# Patient Record
Sex: Female | Born: 1955
Health system: Southern US, Community
[De-identification: ages and names within clinical notes are randomized; demographics above are authoritative.]

## PROBLEM LIST (undated history)

## (undated) DIAGNOSIS — I69391 Dysphagia following cerebral infarction: Secondary | ICD-10-CM

## (undated) DIAGNOSIS — I639 Cerebral infarction, unspecified: Secondary | ICD-10-CM

## (undated) DIAGNOSIS — R569 Unspecified convulsions: Secondary | ICD-10-CM

## (undated) DIAGNOSIS — I10 Essential (primary) hypertension: Secondary | ICD-10-CM

## (undated) DIAGNOSIS — E669 Obesity, unspecified: Secondary | ICD-10-CM

## (undated) DIAGNOSIS — E119 Type 2 diabetes mellitus without complications: Secondary | ICD-10-CM

## (undated) DIAGNOSIS — J45909 Unspecified asthma, uncomplicated: Secondary | ICD-10-CM

## (undated) DIAGNOSIS — R7303 Prediabetes: Secondary | ICD-10-CM

## (undated) DIAGNOSIS — E785 Hyperlipidemia, unspecified: Secondary | ICD-10-CM

## (undated) DIAGNOSIS — E1169 Type 2 diabetes mellitus with other specified complication: Secondary | ICD-10-CM

## (undated) DIAGNOSIS — N1832 Chronic kidney disease, stage 3b: Secondary | ICD-10-CM

## (undated) HISTORY — DX: Dysphagia following cerebral infarction: I69.391

## (undated) HISTORY — DX: Chronic kidney disease, stage 3b: N18.32

## (undated) HISTORY — DX: Unspecified convulsions: R56.9

## (undated) HISTORY — PX: OTHER SURGICAL HISTORY: SHX169

## (undated) HISTORY — PX: CARPAL TUNNEL RELEASE: SHX101

## (undated) HISTORY — DX: Type 2 diabetes mellitus with other specified complication: E66.9

## (undated) HISTORY — DX: Cerebral infarction, unspecified: I63.9

## (undated) HISTORY — DX: Type 2 diabetes mellitus with other specified complication: E11.69

## (undated) HISTORY — DX: Hyperlipidemia, unspecified: E78.5

## (undated) HISTORY — PX: ANKLE SURGERY: SHX546

## (undated) HISTORY — DX: Type 2 diabetes mellitus without complications: E11.9

---

## 2012-09-28 ENCOUNTER — Encounter (HOSPITAL_BASED_OUTPATIENT_CLINIC_OR_DEPARTMENT_OTHER): Payer: Self-pay | Admitting: *Deleted

## 2012-09-28 ENCOUNTER — Emergency Department (HOSPITAL_BASED_OUTPATIENT_CLINIC_OR_DEPARTMENT_OTHER)
Admission: EM | Admit: 2012-09-28 | Discharge: 2012-09-29 | Disposition: A | Discharge status: Home / Self Care | Payer: 59 | Attending: Emergency Medicine | Admitting: Emergency Medicine

## 2012-09-28 DIAGNOSIS — T50995A Adverse effect of other drugs, medicaments and biological substances, initial encounter: Secondary | ICD-10-CM

## 2012-09-28 DIAGNOSIS — T498X5A Adverse effect of other topical agents, initial encounter: Secondary | ICD-10-CM | POA: Insufficient documentation

## 2012-09-28 DIAGNOSIS — I10 Essential (primary) hypertension: Secondary | ICD-10-CM | POA: Insufficient documentation

## 2012-09-28 DIAGNOSIS — J45909 Unspecified asthma, uncomplicated: Secondary | ICD-10-CM | POA: Insufficient documentation

## 2012-09-28 DIAGNOSIS — H05229 Edema of unspecified orbit: Secondary | ICD-10-CM | POA: Insufficient documentation

## 2012-09-28 DIAGNOSIS — Z7982 Long term (current) use of aspirin: Secondary | ICD-10-CM | POA: Insufficient documentation

## 2012-09-28 DIAGNOSIS — Z79899 Other long term (current) drug therapy: Secondary | ICD-10-CM | POA: Insufficient documentation

## 2012-09-28 DIAGNOSIS — R220 Localized swelling, mass and lump, head: Secondary | ICD-10-CM | POA: Insufficient documentation

## 2012-09-28 DIAGNOSIS — R221 Localized swelling, mass and lump, neck: Secondary | ICD-10-CM | POA: Insufficient documentation

## 2012-09-28 HISTORY — DX: Prediabetes: R73.03

## 2012-09-28 HISTORY — DX: Unspecified asthma, uncomplicated: J45.909

## 2012-09-28 HISTORY — DX: Essential (primary) hypertension: I10

## 2012-09-28 MED ORDER — FAMOTIDINE IN NACL 20-0.9 MG/50ML-% IV SOLN
20.0000 mg | Freq: Once | INTRAVENOUS | Status: AC
  Administered 2012-09-28: 20 mg via INTRAVENOUS
  Filled 2012-09-28: qty 50

## 2012-09-28 MED ORDER — DIPHENHYDRAMINE HCL 50 MG/ML IJ SOLN
25.0000 mg | Freq: Once | INTRAMUSCULAR | Status: AC
  Administered 2012-09-28: 25 mg via INTRAVENOUS
  Filled 2012-09-28: qty 1

## 2012-09-28 NOTE — ED Provider Notes (Signed)
History    This chart was scribed for non-physician practitioner working with Hilario Quarry, MD by Donne Anon, ED Scribe. This patient was seen in room MH01/MH01 and the patient's care was started at 2122.   CSN: [id number]  Arrival date & time 09/28/12  2044   First MD Initiated Contact with Patient 09/28/12 2122      Chief Complaint  Patient presents with  . Allergic Reaction     The history is provided by the patient. No language interpreter was used.   Courtney Burke is a 57 y.o. female who presents to the Emergency Department complaining of gradual onset, gradually worsening facial swelling, including her eyes, lips and tongue, which began this morning when she work up. She states on Friday she had her hair dyes and noticed that shortly after her scalp was itching and oozing. She states that her eyes began swelling then as well. She state that yesterday she had a different type of chicken that she had never had. She states she saw her MD this morning and was prescribed Kenalog and Prednisone, but she reports the swelling has gotten worse.   Past Medical History  Diagnosis Date  . Hypertension   . Borderline diabetes   . Asthma     Past Surgical History  Procedure Laterality Date  . Carpel tunnel    . Ankle surgery      No family history on file.  History  Substance Use Topics  . Smoking status: Never Smoker   . Smokeless tobacco: Not on file  . Alcohol Use: No    OB History   Grav Para Term Preterm Abortions TAB SAB Ect Mult Living                  Review of Systems  HENT: Positive for facial swelling.   All other systems reviewed and are negative.    Allergies  Codeine  Home Medications   Current Outpatient Rx  Name  Route  Sig  Dispense  Refill  . albuterol (PROVENTIL HFA;VENTOLIN HFA) 108 (90 BASE) MCG/ACT inhaler   Inhalation   Inhale 2 puffs into the lungs every 6 (six) hours as needed for wheezing.         Marland Kitchen allopurinol (ZYLOPRIM) 100 MG  tablet   Oral   Take 100 mg by mouth daily.         Marland Kitchen amLODipine (NORVASC) 10 MG tablet   Oral   Take 10 mg by mouth daily.         Marland Kitchen aspirin 81 MG tablet   Oral   Take 81 mg by mouth daily.         Marland Kitchen atorvastatin (LIPITOR) 40 MG tablet   Oral   Take 40 mg by mouth daily.         Marland Kitchen linagliptin (TRADJENTA) 5 MG TABS tablet   Oral   Take 5 mg by mouth daily.         Marland Kitchen losartan-hydrochlorothiazide (HYZAAR) 100-12.5 MG per tablet   Oral   Take 1 tablet by mouth daily.         . metFORMIN (GLUCOPHAGE) 500 MG tablet   Oral   Take 500 mg by mouth 3 (three) times daily.         Marland Kitchen oxybutynin (DITROPAN) 5 MG tablet   Oral   Take 5 mg by mouth 3 (three) times daily.           Triage Vitals; BP 134/74  Pulse 75  Temp(Src) 98.9 F (37.2 C) (Oral)  Resp 16  Ht 5\' 8"  (1.727 m)  Wt 143 lb (64.864 kg)  BMI 21.75 kg/m2  SpO2 100%  LMP 09/28/2012  Physical Exam  Nursing note and vitals reviewed. Constitutional: She is oriented to person, place, and time. She appears well-developed and well-nourished. No distress.  HENT:  Head: Normocephalic and atraumatic.  Mouth/Throat: Oropharynx is clear and moist.  Edema surrounding orbits. Throat is clear.  Eyes: EOM are normal.  Neck: Neck supple. No tracheal deviation present.  Cardiovascular: Normal rate.   Pulmonary/Chest: Effort normal. No respiratory distress.  Musculoskeletal: Normal range of motion.  Neurological: She is alert and oriented to person, place, and time.  Skin: Skin is warm and dry.  Psychiatric: She has a normal mood and affect. Her behavior is normal.    ED Course  Procedures (including critical care time) DIAGNOSTIC STUDIES: Oxygen Saturation is 100% on room air, normal by my interpretation.    COORDINATION OF CARE: 9:35 PM Discussed treatment plan which includes fluids, medication and monitoring with pt at bedside and pt agreed to plan.     Labs Reviewed - No data to display No  results found.   No diagnosis found.   Pt has decreased swelling of right side of face,  Pt feels better,   I advised see Dr. Dimple Casey rtomorrow for recheck MDM   I personally performed the services in this documentation, which was scribed in my presence.  The recorded information has been reviewed and considered.   Barnet Pall.        Lonia Skinner [place], PA-C 09/28/12 2322

## 2012-09-28 NOTE — ED Notes (Signed)
Pt states that she got her hair done on Friday. States that she had some of her hair dyed. States on Sat here scalp was itching and "oozing" and eyes started swelling. States she also had a different type of chicken that she had never eaten. States that she saw her MD this morning and was prescribed prednisone and given a kenalog injection. Concerned that eye swelling has not improved and feeling that her tongue may be more swollen that normal. O2  sats 99% on RA. Denies sob. resp even and unlabored on exam. Eyes swollen on exam bilateral

## 2012-09-30 NOTE — ED Provider Notes (Signed)
History/physical exam/procedure(s) were performed by non-physician practitioner and as supervising physician I was immediately available for consultation/collaboration. I have reviewed all notes and am in agreement with care and plan.   Hilario Quarry, MD 09/30/12 1139

## 2013-03-16 DIAGNOSIS — E785 Hyperlipidemia, unspecified: Secondary | ICD-10-CM

## 2013-03-16 HISTORY — DX: Hyperlipidemia, unspecified: E78.5

## 2013-05-21 HISTORY — DX: Morbid (severe) obesity due to excess calories: E66.01

## 2014-08-11 DIAGNOSIS — N181 Chronic kidney disease, stage 1: Secondary | ICD-10-CM

## 2014-08-11 DIAGNOSIS — N183 Chronic kidney disease, stage 3 unspecified: Secondary | ICD-10-CM | POA: Insufficient documentation

## 2014-08-11 DIAGNOSIS — Z903 Acquired absence of stomach [part of]: Secondary | ICD-10-CM

## 2014-08-11 DIAGNOSIS — I129 Hypertensive chronic kidney disease with stage 1 through stage 4 chronic kidney disease, or unspecified chronic kidney disease: Secondary | ICD-10-CM | POA: Insufficient documentation

## 2014-08-11 HISTORY — DX: Acquired absence of stomach (part of): Z90.3

## 2014-08-11 HISTORY — DX: Chronic kidney disease, stage 1: N18.1

## 2015-06-30 LAB — HM DEXA SCAN

## 2015-07-15 DIAGNOSIS — M8580 Other specified disorders of bone density and structure, unspecified site: Secondary | ICD-10-CM | POA: Insufficient documentation

## 2015-09-28 DIAGNOSIS — B354 Tinea corporis: Secondary | ICD-10-CM | POA: Insufficient documentation

## 2015-09-28 DIAGNOSIS — H2513 Age-related nuclear cataract, bilateral: Secondary | ICD-10-CM

## 2015-09-28 DIAGNOSIS — R0683 Snoring: Secondary | ICD-10-CM | POA: Insufficient documentation

## 2015-09-28 DIAGNOSIS — I10 Essential (primary) hypertension: Secondary | ICD-10-CM | POA: Insufficient documentation

## 2015-09-28 DIAGNOSIS — N1832 Chronic kidney disease, stage 3b: Secondary | ICD-10-CM

## 2015-09-28 DIAGNOSIS — R7301 Impaired fasting glucose: Secondary | ICD-10-CM | POA: Insufficient documentation

## 2015-09-28 DIAGNOSIS — D473 Essential (hemorrhagic) thrombocythemia: Secondary | ICD-10-CM

## 2015-09-28 DIAGNOSIS — E669 Obesity, unspecified: Secondary | ICD-10-CM | POA: Insufficient documentation

## 2015-09-28 DIAGNOSIS — H16223 Keratoconjunctivitis sicca, not specified as Sjogren's, bilateral: Secondary | ICD-10-CM | POA: Insufficient documentation

## 2015-09-28 DIAGNOSIS — R232 Flushing: Secondary | ICD-10-CM | POA: Insufficient documentation

## 2015-09-28 DIAGNOSIS — N1831 Chronic kidney disease, stage 3a: Secondary | ICD-10-CM

## 2015-09-28 DIAGNOSIS — N959 Unspecified menopausal and perimenopausal disorder: Secondary | ICD-10-CM | POA: Insufficient documentation

## 2015-09-28 DIAGNOSIS — Z9884 Bariatric surgery status: Secondary | ICD-10-CM | POA: Insufficient documentation

## 2015-09-28 DIAGNOSIS — K219 Gastro-esophageal reflux disease without esophagitis: Secondary | ICD-10-CM | POA: Insufficient documentation

## 2015-09-28 DIAGNOSIS — Z789 Other specified health status: Secondary | ICD-10-CM | POA: Insufficient documentation

## 2015-09-28 DIAGNOSIS — R102 Pelvic and perineal pain: Secondary | ICD-10-CM | POA: Insufficient documentation

## 2015-09-28 DIAGNOSIS — R1319 Other dysphagia: Secondary | ICD-10-CM

## 2015-09-28 DIAGNOSIS — M2550 Pain in unspecified joint: Secondary | ICD-10-CM | POA: Insufficient documentation

## 2015-09-28 DIAGNOSIS — N318 Other neuromuscular dysfunction of bladder: Secondary | ICD-10-CM | POA: Insufficient documentation

## 2015-09-28 DIAGNOSIS — H524 Presbyopia: Secondary | ICD-10-CM | POA: Insufficient documentation

## 2015-09-28 DIAGNOSIS — J45909 Unspecified asthma, uncomplicated: Secondary | ICD-10-CM | POA: Insufficient documentation

## 2015-09-28 HISTORY — DX: Age-related nuclear cataract, bilateral: H25.13

## 2015-09-28 HISTORY — DX: Essential (primary) hypertension: I10

## 2015-09-28 HISTORY — DX: Gastro-esophageal reflux disease without esophagitis: K21.9

## 2015-09-28 HISTORY — DX: Keratoconjunctivitis sicca, not specified as Sjogren's, bilateral: H16.223

## 2015-09-28 HISTORY — DX: Essential (hemorrhagic) thrombocythemia: D47.3

## 2015-09-28 HISTORY — DX: Pain in unspecified joint: M25.50

## 2015-09-28 HISTORY — DX: Bariatric surgery status: Z98.84

## 2016-02-16 DIAGNOSIS — R519 Headache, unspecified: Secondary | ICD-10-CM | POA: Insufficient documentation

## 2016-05-08 DIAGNOSIS — J4520 Mild intermittent asthma, uncomplicated: Secondary | ICD-10-CM | POA: Insufficient documentation

## 2016-05-08 DIAGNOSIS — F322 Major depressive disorder, single episode, severe without psychotic features: Secondary | ICD-10-CM | POA: Insufficient documentation

## 2016-05-08 DIAGNOSIS — E559 Vitamin D deficiency, unspecified: Secondary | ICD-10-CM | POA: Insufficient documentation

## 2016-05-08 DIAGNOSIS — Z0000 Encounter for general adult medical examination without abnormal findings: Secondary | ICD-10-CM | POA: Insufficient documentation

## 2016-07-02 DIAGNOSIS — R920 Mammographic microcalcification found on diagnostic imaging of breast: Secondary | ICD-10-CM | POA: Insufficient documentation

## 2017-05-09 DIAGNOSIS — J301 Allergic rhinitis due to pollen: Secondary | ICD-10-CM | POA: Insufficient documentation

## 2018-06-05 DIAGNOSIS — G8929 Other chronic pain: Secondary | ICD-10-CM

## 2018-06-05 DIAGNOSIS — N62 Hypertrophy of breast: Secondary | ICD-10-CM | POA: Insufficient documentation

## 2018-06-05 DIAGNOSIS — M546 Pain in thoracic spine: Secondary | ICD-10-CM

## 2018-06-05 HISTORY — DX: Other chronic pain: G89.29

## 2018-06-05 HISTORY — DX: Pain in thoracic spine: M54.6

## 2019-07-08 ENCOUNTER — Encounter (HOSPITAL_BASED_OUTPATIENT_CLINIC_OR_DEPARTMENT_OTHER): Payer: Self-pay

## 2019-07-08 ENCOUNTER — Emergency Department (HOSPITAL_BASED_OUTPATIENT_CLINIC_OR_DEPARTMENT_OTHER)
Admission: EM | Admit: 2019-07-08 | Discharge: 2019-07-08 | Disposition: A | Discharge status: Home / Self Care | Payer: Medicare Other | Attending: Emergency Medicine | Admitting: Emergency Medicine

## 2019-07-08 ENCOUNTER — Other Ambulatory Visit: Payer: Self-pay

## 2019-07-08 DIAGNOSIS — Z7982 Long term (current) use of aspirin: Secondary | ICD-10-CM | POA: Diagnosis not present

## 2019-07-08 DIAGNOSIS — I10 Essential (primary) hypertension: Secondary | ICD-10-CM | POA: Insufficient documentation

## 2019-07-08 DIAGNOSIS — R197 Diarrhea, unspecified: Secondary | ICD-10-CM | POA: Diagnosis not present

## 2019-07-08 DIAGNOSIS — Z79899 Other long term (current) drug therapy: Secondary | ICD-10-CM | POA: Insufficient documentation

## 2019-07-08 DIAGNOSIS — J45909 Unspecified asthma, uncomplicated: Secondary | ICD-10-CM | POA: Diagnosis not present

## 2019-07-08 DIAGNOSIS — R112 Nausea with vomiting, unspecified: Secondary | ICD-10-CM

## 2019-07-08 LAB — CBC WITH DIFFERENTIAL/PLATELET
Abs Immature Granulocytes: 0.1 10*3/uL — ABNORMAL HIGH (ref 0.00–0.07)
Basophils Absolute: 0 10*3/uL (ref 0.0–0.1)
Basophils Relative: 0 %
Eosinophils Absolute: 1.2 10*3/uL — ABNORMAL HIGH (ref 0.0–0.5)
Eosinophils Relative: 14 %
HCT: 37.9 % (ref 36.0–46.0)
Hemoglobin: 12.7 g/dL (ref 12.0–15.0)
Immature Granulocytes: 1 %
Lymphocytes Relative: 21 %
Lymphs Abs: 1.9 10*3/uL (ref 0.7–4.0)
MCH: 28.5 pg (ref 26.0–34.0)
MCHC: 33.5 g/dL (ref 30.0–36.0)
MCV: 85.2 fL (ref 80.0–100.0)
Monocytes Absolute: 0.5 10*3/uL (ref 0.1–1.0)
Monocytes Relative: 6 %
Neutro Abs: 5 10*3/uL (ref 1.7–7.7)
Neutrophils Relative %: 58 %
Platelets: 453 10*3/uL — ABNORMAL HIGH (ref 150–400)
RBC: 4.45 MIL/uL (ref 3.87–5.11)
RDW: 14.1 % (ref 11.5–15.5)
WBC: 8.7 10*3/uL (ref 4.0–10.5)
nRBC: 0 % (ref 0.0–0.2)

## 2019-07-08 LAB — COMPREHENSIVE METABOLIC PANEL
ALT: 14 U/L (ref 0–44)
AST: 18 U/L (ref 15–41)
Albumin: 3.8 g/dL (ref 3.5–5.0)
Alkaline Phosphatase: 86 U/L (ref 38–126)
Anion gap: 7 (ref 5–15)
BUN: 16 mg/dL (ref 8–23)
CO2: 23 mmol/L (ref 22–32)
Calcium: 9.4 mg/dL (ref 8.9–10.3)
Chloride: 108 mmol/L (ref 98–111)
Creatinine, Ser: 1.59 mg/dL — ABNORMAL HIGH (ref 0.44–1.00)
GFR calc Af Amer: 39 mL/min — ABNORMAL LOW (ref >60)
GFR calc non Af Amer: 34 mL/min — ABNORMAL LOW (ref >60)
Glucose, Bld: 121 mg/dL — ABNORMAL HIGH (ref 70–99)
Potassium: 3.2 mmol/L — ABNORMAL LOW (ref 3.5–5.1)
Sodium: 138 mmol/L (ref 135–145)
Total Bilirubin: 0.8 mg/dL (ref 0.3–1.2)
Total Protein: 7.2 g/dL (ref 6.5–8.1)

## 2019-07-08 LAB — LIPASE, BLOOD: Lipase: 21 U/L (ref 11–51)

## 2019-07-08 MED ORDER — SODIUM CHLORIDE 0.9 % IV BOLUS
1000.0000 mL | Freq: Once | INTRAVENOUS | Status: AC
  Administered 2019-07-08: 1000 mL via INTRAVENOUS

## 2019-07-08 MED ORDER — ONDANSETRON HCL 4 MG/2ML IJ SOLN
4.0000 mg | Freq: Once | INTRAMUSCULAR | Status: AC
  Administered 2019-07-08: 13:00:00 4 mg via INTRAVENOUS
  Filled 2019-07-08: qty 2

## 2019-07-08 MED ORDER — SODIUM CHLORIDE 0.9 % IV BOLUS
500.0000 mL | Freq: Once | INTRAVENOUS | Status: AC
  Administered 2019-07-08: 500 mL via INTRAVENOUS

## 2019-07-08 MED ORDER — DICYCLOMINE HCL 20 MG PO TABS: 20.0000 mg | ORAL_TABLET | Freq: Two times a day (BID) | ORAL | 0 refills | Status: DC | PRN

## 2019-07-08 MED ORDER — FENTANYL CITRATE (PF) 100 MCG/2ML IJ SOLN
50.0000 ug | Freq: Once | INTRAMUSCULAR | Status: AC
  Administered 2019-07-08: 12:00:00 50 ug via INTRAVENOUS
  Filled 2019-07-08: qty 2

## 2019-07-08 MED ORDER — POTASSIUM CHLORIDE CRYS ER 20 MEQ PO TBCR
40.0000 meq | EXTENDED_RELEASE_TABLET | Freq: Once | ORAL | Status: AC
  Administered 2019-07-08: 14:00:00 40 meq via ORAL
  Filled 2019-07-08: qty 2

## 2019-07-08 MED ORDER — ONDANSETRON HCL 4 MG/2ML IJ SOLN
4.0000 mg | Freq: Once | INTRAMUSCULAR | Status: AC
  Administered 2019-07-08: 4 mg via INTRAVENOUS
  Filled 2019-07-08: qty 2

## 2019-07-08 NOTE — ED Triage Notes (Addendum)
Pt c/o abd pain, n/v/d x 5 days-NAD-steady gait-pt states she was seen by PCP yesterday-had labs drawn was advised she was dehydrated-neg rapid covid-rx zofran

## 2019-07-08 NOTE — Discharge Instructions (Addendum)
Follow up with your primary care doctor to discuss your hospital visit. Continue to hydrate orally with small sips of fluids throughout the day. Use Zofran as directed for nausea & vomiting. Bentyl as needed for abdominal pain.    SEEK IMMEDIATE MEDICAL ATTENTION IF: You begin having localized abdominal pain that does not go away or becomes severe You develop a fever Repeated vomiting occurs (multiple uncontrollable episodes) or you are unable to keep fluids down Blood is being passed in stools or vomit (bright red or black tarry stools).  New or worsening symptoms develop or you have any additional concerns.

## 2019-07-08 NOTE — ED Provider Notes (Signed)
[facility]PART[facility] Provider Note   CSN: [id number] Arrival date & time: 07/08/19  1117     History Chief Complaint  Patient presents with  . Abdominal Pain    Courtney Burke is a 64 y.o. female.  The history is provided by the patient and medical records. No language interpreter was used.  Abdominal Pain Associated symptoms: diarrhea, nausea and vomiting    Courtney Burke is a 64 y.o. female  with a PMH as listed below who presents to the Emergency Department complaining of generalized abdominal pain, nausea and vomiting for the last 5 days.  Associated with multiple episodes of nonbloody diarrhea.  Seen by primary care doctor yesterday for same where labs were drawn which were reassuring.  Covid test was performed in the office and negative, however PCR was sent and she will have results for that sometime today.  She was given prescription for Zofran.  She has tried this, however has continued to retch throughout the night despite medication.  Denies any fever or chills.  No urinary symptoms.  No cough, congestion, chest pain or shortness of breath.  No recent antibiotic use.  No known sick contacts.      Past Medical History:  Diagnosis Date  . Asthma   . Borderline diabetes   . Hypertension     There are no problems to display for this patient.   Past Surgical History:  Procedure Laterality Date  . ANKLE SURGERY    . CARPAL TUNNEL RELEASE    . carpel tunnel       OB History   No obstetric history on file.     No family history on file.  Social History   Tobacco Use  . Smoking status: Never Smoker  . Smokeless tobacco: Never Used  Substance Use Topics  . Alcohol use: No  . Drug use: No    Home Medications Prior to Admission medications   Medication Sig Start Date End Date Taking? Authorizing Provider  albuterol (PROVENTIL HFA;VENTOLIN HFA) 108 (90 BASE) MCG/ACT inhaler Inhale 2 puffs into the lungs every 6 (six) hours as needed for  wheezing.    [provider]  allopurinol (ZYLOPRIM) 100 MG tablet Take 100 mg by mouth daily.    [provider]  amLODipine (NORVASC) 10 MG tablet Take 10 mg by mouth daily.    [provider]  aspirin 81 MG tablet Take 81 mg by mouth daily.    [provider]  atorvastatin (LIPITOR) 40 MG tablet Take 40 mg by mouth daily.    [provider]  dicyclomine (BENTYL) 20 MG tablet Take 1 tablet (20 mg total) by mouth 2 (two) times daily as needed (Abdominal pain). 07/08/19   Angela Vazguez, Ozella Almond, PA-C  linagliptin (TRADJENTA) 5 MG TABS tablet Take 5 mg by mouth daily.    [provider]  losartan-hydrochlorothiazide (HYZAAR) 100-12.5 MG per tablet Take 1 tablet by mouth daily.    [provider]  metFORMIN (GLUCOPHAGE) 500 MG tablet Take 500 mg by mouth 3 (three) times daily.    [provider]  oxybutynin (DITROPAN) 5 MG tablet Take 5 mg by mouth 3 (three) times daily.    [provider]    Allergies    Codeine  Review of Systems   Review of Systems  Gastrointestinal: Positive for abdominal pain, diarrhea, nausea and vomiting.  All other systems reviewed and are negative.   Physical Exam Updated Vital Signs BP 136/75 (BP Location: Left  Arm)   Pulse 79   Temp 99.1 F (37.3 C) (Oral)   Resp 20   Ht 5\' 5"  (1.651 m)   Wt 99.8 kg   LMP 09/28/2012   SpO2 98%   BMI 36.61 kg/m   Physical Exam Vitals and nursing note reviewed.  Constitutional:      General: She is not in acute distress.    Appearance: She is well-developed.  HENT:     Head: Normocephalic and atraumatic.  Cardiovascular:     Rate and Rhythm: Normal rate and regular rhythm.     Heart sounds: Normal heart sounds. No murmur.  Pulmonary:     Effort: Pulmonary effort is normal. No respiratory distress.     Breath sounds: Normal breath sounds.  Abdominal:     General: There is no distension.     Palpations: Abdomen is soft.     Comments:  Generalized abdominal tenderness without rebound or guarding. No focal tenderness at McBurney's. Negative Murphy's.  Musculoskeletal:     Cervical back: Neck supple.  Skin:    General: Skin is warm and dry.  Neurological:     Mental Status: She is alert and oriented to person, place, and time.     ED Results / Procedures / Treatments   Labs (all labs ordered are listed, but only abnormal results are displayed) Labs Reviewed  COMPREHENSIVE METABOLIC PANEL - Abnormal; Notable for the following components:      Result Value   Potassium 3.2 (*)    Glucose, Bld 121 (*)    Creatinine, Ser 1.59 (*)    GFR calc non Af Amer 34 (*)    GFR calc Af Amer 39 (*)    All other components within normal limits  CBC WITH DIFFERENTIAL/PLATELET - Abnormal; Notable for the following components:   Platelets 453 (*)    Eosinophils Absolute 1.2 (*)    Abs Immature Granulocytes 0.10 (*)    All other components within normal limits  LIPASE, BLOOD    EKG None  Radiology No results found.  Procedures Procedures (including critical care time)  Medications Ordered in ED Medications  sodium chloride 0.9 % bolus 1,000 mL ( Intravenous Stopped 07/08/19 1302)  ondansetron (ZOFRAN) injection 4 mg (4 mg Intravenous Given 07/08/19 1159)  fentaNYL (SUBLIMAZE) injection 50 mcg (50 mcg Intravenous Given 07/08/19 1200)  sodium chloride 0.9 % bolus 500 mL ( Intravenous Stopped 07/08/19 1433)  ondansetron (ZOFRAN) injection 4 mg (4 mg Intravenous Given 07/08/19 1322)  potassium chloride SA (KLOR-CON) CR tablet 40 mEq (40 mEq Oral Given 07/08/19 1330)    ED Course  I have reviewed the triage vital signs and the nursing notes.  Pertinent labs & imaging results that were available during my care of the patient were reviewed by me and considered in my medical decision making (see chart for details).    MDM Rules/Calculators/A&P                      Courtney Burke is a 64 y.o. female who presents to ED for nausea,  vomiting and non-bloody diarrhea for 5 days. On exam, patient is afebrile, non-toxic appearing with a non-surgical abdominal exam. IV fluids and nausea medications given. Labs reviewed. Normal white count. Hypokalemia at 3.2 which was replenished in ED. Creatinine now 1.59 compared to 1.36 which is likely 2/2 dehydration. She was given 1.5 L IVF. On repeat examination, patient is now tolerating PO with no episodes of emesis since medication administration.  Repeat abdominal exam improved. Evaluation does not show pathology that would require ongoing emergent intervention or inpatient treatment. Encouraged her to call PCP today to schedule a follow up appointment in the next few days for recheck, Spoke at length with patient about signs or symptoms that should prompt return to emergency Department including inability to tolerate PO, blood in the stools, fevers, focal localization of abdominal pain, new/worsening symptoms or any additional concerns. Patient understands diagnosis and plan of care as dictated above. All questions answered.   Final Clinical Impression(s) / ED Diagnoses Final diagnoses:  Nausea vomiting and diarrhea    Rx / DC Orders ED Discharge Orders         Ordered    dicyclomine (BENTYL) 20 MG tablet  2 times daily PRN     07/08/19 1427           Candra Wegner, Ozella Almond, PA-C 07/08/19 1507    Margette Fast, MD 07/09/19 [phone]

## 2019-07-15 DIAGNOSIS — N179 Acute kidney failure, unspecified: Secondary | ICD-10-CM | POA: Diagnosis present

## 2019-07-16 DIAGNOSIS — R197 Diarrhea, unspecified: Secondary | ICD-10-CM | POA: Insufficient documentation

## 2019-07-16 DIAGNOSIS — R112 Nausea with vomiting, unspecified: Secondary | ICD-10-CM | POA: Insufficient documentation

## 2019-07-20 DIAGNOSIS — K2960 Other gastritis without bleeding: Secondary | ICD-10-CM

## 2019-07-20 DIAGNOSIS — K209 Esophagitis, unspecified without bleeding: Secondary | ICD-10-CM

## 2019-07-20 HISTORY — DX: Esophagitis, unspecified without bleeding: K20.90

## 2019-07-20 HISTORY — DX: Other gastritis without bleeding: K29.60

## 2019-08-18 DIAGNOSIS — K8020 Calculus of gallbladder without cholecystitis without obstruction: Secondary | ICD-10-CM

## 2019-08-18 HISTORY — DX: Calculus of gallbladder without cholecystitis without obstruction: K80.20

## 2020-06-30 ENCOUNTER — Emergency Department (HOSPITAL_COMMUNITY): Payer: Medicare Other | Admitting: Anesthesiology

## 2020-06-30 ENCOUNTER — Encounter (HOSPITAL_COMMUNITY): Payer: Self-pay | Admitting: Neurology

## 2020-06-30 ENCOUNTER — Emergency Department (HOSPITAL_COMMUNITY): Payer: Medicare Other

## 2020-06-30 ENCOUNTER — Inpatient Hospital Stay (HOSPITAL_COMMUNITY): Payer: Medicare Other

## 2020-06-30 ENCOUNTER — Inpatient Hospital Stay (HOSPITAL_COMMUNITY)
Admission: EM | Admit: 2020-06-30 | Discharge: 2020-07-09 | DRG: 023 | Disposition: A | Discharge status: Rehabilitation Facility | Payer: Medicare Other | Attending: Student | Admitting: Student

## 2020-06-30 ENCOUNTER — Encounter (HOSPITAL_COMMUNITY)
Admission: EM | Disposition: A | Discharge status: Rehabilitation Facility | Payer: Self-pay | Source: Home / Self Care | Attending: Student

## 2020-06-30 DIAGNOSIS — Z4659 Encounter for fitting and adjustment of other gastrointestinal appliance and device: Secondary | ICD-10-CM

## 2020-06-30 DIAGNOSIS — R471 Dysarthria and anarthria: Secondary | ICD-10-CM | POA: Diagnosis present

## 2020-06-30 DIAGNOSIS — E872 Acidosis: Secondary | ICD-10-CM | POA: Diagnosis present

## 2020-06-30 DIAGNOSIS — I63412 Cerebral infarction due to embolism of left middle cerebral artery: Principal | ICD-10-CM | POA: Diagnosis present

## 2020-06-30 DIAGNOSIS — I10 Essential (primary) hypertension: Secondary | ICD-10-CM | POA: Diagnosis not present

## 2020-06-30 DIAGNOSIS — E669 Obesity, unspecified: Secondary | ICD-10-CM

## 2020-06-30 DIAGNOSIS — J45909 Unspecified asthma, uncomplicated: Secondary | ICD-10-CM | POA: Diagnosis present

## 2020-06-30 DIAGNOSIS — D62 Acute posthemorrhagic anemia: Secondary | ICD-10-CM | POA: Diagnosis not present

## 2020-06-30 DIAGNOSIS — Z6837 Body mass index (BMI) 37.0-37.9, adult: Secondary | ICD-10-CM

## 2020-06-30 DIAGNOSIS — I69320 Aphasia following cerebral infarction: Secondary | ICD-10-CM | POA: Diagnosis not present

## 2020-06-30 DIAGNOSIS — J9600 Acute respiratory failure, unspecified whether with hypoxia or hypercapnia: Secondary | ICD-10-CM | POA: Diagnosis present

## 2020-06-30 DIAGNOSIS — E1122 Type 2 diabetes mellitus with diabetic chronic kidney disease: Secondary | ICD-10-CM | POA: Diagnosis present

## 2020-06-30 DIAGNOSIS — D509 Iron deficiency anemia, unspecified: Secondary | ICD-10-CM | POA: Diagnosis not present

## 2020-06-30 DIAGNOSIS — I63512 Cerebral infarction due to unspecified occlusion or stenosis of left middle cerebral artery: Secondary | ICD-10-CM | POA: Diagnosis present

## 2020-06-30 DIAGNOSIS — Z882 Allergy status to sulfonamides status: Secondary | ICD-10-CM

## 2020-06-30 DIAGNOSIS — G8191 Hemiplegia, unspecified affecting right dominant side: Secondary | ICD-10-CM | POA: Diagnosis present

## 2020-06-30 DIAGNOSIS — R531 Weakness: Secondary | ICD-10-CM | POA: Diagnosis present

## 2020-06-30 DIAGNOSIS — E1169 Type 2 diabetes mellitus with other specified complication: Secondary | ICD-10-CM

## 2020-06-30 DIAGNOSIS — Z79899 Other long term (current) drug therapy: Secondary | ICD-10-CM

## 2020-06-30 DIAGNOSIS — K661 Hemoperitoneum: Secondary | ICD-10-CM | POA: Diagnosis present

## 2020-06-30 DIAGNOSIS — R1319 Other dysphagia: Secondary | ICD-10-CM

## 2020-06-30 DIAGNOSIS — E870 Hyperosmolality and hypernatremia: Secondary | ICD-10-CM | POA: Diagnosis not present

## 2020-06-30 DIAGNOSIS — E785 Hyperlipidemia, unspecified: Secondary | ICD-10-CM

## 2020-06-30 DIAGNOSIS — M109 Gout, unspecified: Secondary | ICD-10-CM | POA: Diagnosis present

## 2020-06-30 DIAGNOSIS — R569 Unspecified convulsions: Secondary | ICD-10-CM | POA: Diagnosis not present

## 2020-06-30 DIAGNOSIS — I69391 Dysphagia following cerebral infarction: Secondary | ICD-10-CM

## 2020-06-30 DIAGNOSIS — R29721 NIHSS score 21: Secondary | ICD-10-CM | POA: Diagnosis present

## 2020-06-30 DIAGNOSIS — R29810 Facial weakness: Secondary | ICD-10-CM | POA: Diagnosis present

## 2020-06-30 DIAGNOSIS — R339 Retention of urine, unspecified: Secondary | ICD-10-CM | POA: Diagnosis present

## 2020-06-30 DIAGNOSIS — I129 Hypertensive chronic kidney disease with stage 1 through stage 4 chronic kidney disease, or unspecified chronic kidney disease: Secondary | ICD-10-CM | POA: Diagnosis present

## 2020-06-30 DIAGNOSIS — Z978 Presence of other specified devices: Secondary | ICD-10-CM

## 2020-06-30 DIAGNOSIS — Z885 Allergy status to narcotic agent status: Secondary | ICD-10-CM

## 2020-06-30 DIAGNOSIS — Z881 Allergy status to other antibiotic agents status: Secondary | ICD-10-CM

## 2020-06-30 DIAGNOSIS — Z7902 Long term (current) use of antithrombotics/antiplatelets: Secondary | ICD-10-CM

## 2020-06-30 DIAGNOSIS — Z7984 Long term (current) use of oral hypoglycemic drugs: Secondary | ICD-10-CM

## 2020-06-30 DIAGNOSIS — N1831 Chronic kidney disease, stage 3a: Secondary | ICD-10-CM | POA: Diagnosis present

## 2020-06-30 DIAGNOSIS — Z9884 Bariatric surgery status: Secondary | ICD-10-CM

## 2020-06-30 DIAGNOSIS — R4701 Aphasia: Secondary | ICD-10-CM | POA: Diagnosis present

## 2020-06-30 DIAGNOSIS — H5347 Heteronymous bilateral field defects: Secondary | ICD-10-CM | POA: Diagnosis present

## 2020-06-30 DIAGNOSIS — Z823 Family history of stroke: Secondary | ICD-10-CM | POA: Diagnosis not present

## 2020-06-30 DIAGNOSIS — R1310 Dysphagia, unspecified: Secondary | ICD-10-CM | POA: Diagnosis present

## 2020-06-30 DIAGNOSIS — N179 Acute kidney failure, unspecified: Secondary | ICD-10-CM | POA: Diagnosis present

## 2020-06-30 DIAGNOSIS — Z20822 Contact with and (suspected) exposure to covid-19: Secondary | ICD-10-CM | POA: Diagnosis present

## 2020-06-30 DIAGNOSIS — I639 Cerebral infarction, unspecified: Secondary | ICD-10-CM

## 2020-06-30 DIAGNOSIS — J95821 Acute postprocedural respiratory failure: Secondary | ICD-10-CM | POA: Diagnosis not present

## 2020-06-30 DIAGNOSIS — R338 Other retention of urine: Secondary | ICD-10-CM | POA: Diagnosis not present

## 2020-06-30 DIAGNOSIS — Z91041 Radiographic dye allergy status: Secondary | ICD-10-CM

## 2020-06-30 DIAGNOSIS — R509 Fever, unspecified: Secondary | ICD-10-CM

## 2020-06-30 DIAGNOSIS — N189 Chronic kidney disease, unspecified: Secondary | ICD-10-CM | POA: Diagnosis not present

## 2020-06-30 DIAGNOSIS — Z7982 Long term (current) use of aspirin: Secondary | ICD-10-CM

## 2020-06-30 DIAGNOSIS — I672 Cerebral atherosclerosis: Secondary | ICD-10-CM | POA: Diagnosis present

## 2020-06-30 DIAGNOSIS — E559 Vitamin D deficiency, unspecified: Secondary | ICD-10-CM | POA: Diagnosis present

## 2020-06-30 DIAGNOSIS — N1832 Chronic kidney disease, stage 3b: Secondary | ICD-10-CM

## 2020-06-30 DIAGNOSIS — Z888 Allergy status to other drugs, medicaments and biological substances status: Secondary | ICD-10-CM

## 2020-06-30 DIAGNOSIS — D631 Anemia in chronic kidney disease: Secondary | ICD-10-CM | POA: Diagnosis not present

## 2020-06-30 DIAGNOSIS — G460 Middle cerebral artery syndrome: Secondary | ICD-10-CM | POA: Diagnosis present

## 2020-06-30 DIAGNOSIS — R159 Full incontinence of feces: Secondary | ICD-10-CM | POA: Diagnosis present

## 2020-06-30 DIAGNOSIS — E876 Hypokalemia: Secondary | ICD-10-CM | POA: Diagnosis present

## 2020-06-30 DIAGNOSIS — I6521 Occlusion and stenosis of right carotid artery: Secondary | ICD-10-CM | POA: Diagnosis present

## 2020-06-30 HISTORY — PX: IR CT HEAD LTD: IMG2386

## 2020-06-30 HISTORY — PX: RADIOLOGY WITH ANESTHESIA: SHX6223

## 2020-06-30 HISTORY — DX: Cerebral infarction due to unspecified occlusion or stenosis of left middle cerebral artery: I63.512

## 2020-06-30 HISTORY — PX: IR PERCUTANEOUS ART THROMBECTOMY/INFUSION INTRACRANIAL INC DIAG ANGIO: IMG6087

## 2020-06-30 HISTORY — PX: IR INTRA CRAN STENT: IMG2345

## 2020-06-30 LAB — POCT I-STAT 7, (LYTES, BLD GAS, ICA,H+H)
Acid-base deficit: 10 mmol/L — ABNORMAL HIGH (ref 0.0–2.0)
Bicarbonate: 15.7 mmol/L — ABNORMAL LOW (ref 20.0–28.0)
Calcium, Ion: 1.17 mmol/L (ref 1.15–1.40)
HCT: 29 % — ABNORMAL LOW (ref 36.0–46.0)
Hemoglobin: 9.9 g/dL — ABNORMAL LOW (ref 12.0–15.0)
O2 Saturation: 100 %
Potassium: 3.4 mmol/L — ABNORMAL LOW (ref 3.5–5.1)
Sodium: 142 mmol/L (ref 135–145)
TCO2: 17 mmol/L — ABNORMAL LOW (ref 22–32)
pCO2 arterial: 33.7 mmHg (ref 32.0–48.0)
pH, Arterial: 7.275 — ABNORMAL LOW (ref 7.350–7.450)
pO2, Arterial: 377 mmHg — ABNORMAL HIGH (ref 83.0–108.0)

## 2020-06-30 LAB — I-STAT CHEM 8, ED
BUN: 17 mg/dL (ref 8–23)
Calcium, Ion: 1.22 mmol/L (ref 1.15–1.40)
Chloride: 111 mmol/L (ref 98–111)
Creatinine, Ser: 1.3 mg/dL — ABNORMAL HIGH (ref 0.44–1.00)
Glucose, Bld: 135 mg/dL — ABNORMAL HIGH (ref 70–99)
HCT: 33 % — ABNORMAL LOW (ref 36.0–46.0)
Hemoglobin: 11.2 g/dL — ABNORMAL LOW (ref 12.0–15.0)
Potassium: 3.6 mmol/L (ref 3.5–5.1)
Sodium: 142 mmol/L (ref 135–145)
TCO2: 22 mmol/L (ref 22–32)

## 2020-06-30 LAB — COMPREHENSIVE METABOLIC PANEL
ALT: 15 U/L (ref 0–44)
AST: 21 U/L (ref 15–41)
Albumin: 3.4 g/dL — ABNORMAL LOW (ref 3.5–5.0)
Alkaline Phosphatase: 101 U/L (ref 38–126)
Anion gap: 9 (ref 5–15)
BUN: 14 mg/dL (ref 8–23)
CO2: 20 mmol/L — ABNORMAL LOW (ref 22–32)
Calcium: 9 mg/dL (ref 8.9–10.3)
Chloride: 111 mmol/L (ref 98–111)
Creatinine, Ser: 1.41 mg/dL — ABNORMAL HIGH (ref 0.44–1.00)
GFR, Estimated: 41 mL/min — ABNORMAL LOW (ref >60)
Glucose, Bld: 140 mg/dL — ABNORMAL HIGH (ref 70–99)
Potassium: 3.5 mmol/L (ref 3.5–5.1)
Sodium: 140 mmol/L (ref 135–145)
Total Bilirubin: 0.5 mg/dL (ref 0.3–1.2)
Total Protein: 6.4 g/dL — ABNORMAL LOW (ref 6.5–8.1)

## 2020-06-30 LAB — CBC
HCT: 36.3 % (ref 36.0–46.0)
Hemoglobin: 11.5 g/dL — ABNORMAL LOW (ref 12.0–15.0)
MCH: 28 pg (ref 26.0–34.0)
MCHC: 31.7 g/dL (ref 30.0–36.0)
MCV: 88.5 fL (ref 80.0–100.0)
Platelets: 507 10*3/uL — ABNORMAL HIGH (ref 150–400)
RBC: 4.1 MIL/uL (ref 3.87–5.11)
RDW: 13.8 % (ref 11.5–15.5)
WBC: 6.1 10*3/uL (ref 4.0–10.5)
nRBC: 0 % (ref 0.0–0.2)

## 2020-06-30 LAB — DIFFERENTIAL
Abs Immature Granulocytes: 0.03 10*3/uL (ref 0.00–0.07)
Basophils Absolute: 0 10*3/uL (ref 0.0–0.1)
Basophils Relative: 1 %
Eosinophils Absolute: 0.3 10*3/uL (ref 0.0–0.5)
Eosinophils Relative: 6 %
Immature Granulocytes: 1 %
Lymphocytes Relative: 29 %
Lymphs Abs: 1.8 10*3/uL (ref 0.7–4.0)
Monocytes Absolute: 0.4 10*3/uL (ref 0.1–1.0)
Monocytes Relative: 6 %
Neutro Abs: 3.6 10*3/uL (ref 1.7–7.7)
Neutrophils Relative %: 57 %

## 2020-06-30 LAB — APTT: aPTT: 25 seconds (ref 24–36)

## 2020-06-30 LAB — GLUCOSE, CAPILLARY
Glucose-Capillary: 192 mg/dL — ABNORMAL HIGH (ref 70–99)
Glucose-Capillary: 148 mg/dL — ABNORMAL HIGH (ref 70–99)
Glucose-Capillary: 137 mg/dL — ABNORMAL HIGH (ref 70–99)

## 2020-06-30 LAB — SARS CORONAVIRUS 2 BY RT PCR (HOSPITAL ORDER, PERFORMED IN ~~LOC~~ HOSPITAL LAB): SARS Coronavirus 2: NEGATIVE

## 2020-06-30 LAB — PROTIME-INR
INR: 1.1 (ref 0.8–1.2)
Prothrombin Time: 13.6 seconds (ref 11.4–15.2)

## 2020-06-30 LAB — MRSA PCR SCREENING: MRSA by PCR: NEGATIVE

## 2020-06-30 LAB — CBG MONITORING, ED
Glucose-Capillary: 134 mg/dL — ABNORMAL HIGH (ref 70–99)
Glucose-Capillary: 135 mg/dL — ABNORMAL HIGH (ref 70–99)

## 2020-06-30 LAB — HIV ANTIBODY (ROUTINE TESTING W REFLEX): HIV Screen 4th Generation wRfx: NONREACTIVE

## 2020-06-30 IMAGING — DX DG CHEST 1V PORT
1 series · 1 of 1 positions shown · non-contrast
Comparison: [DATE]

CLINICAL DATA: Evaluate endotracheal tube placement

EXAM:
PORTABLE CHEST 1 VIEW

[chest ap]
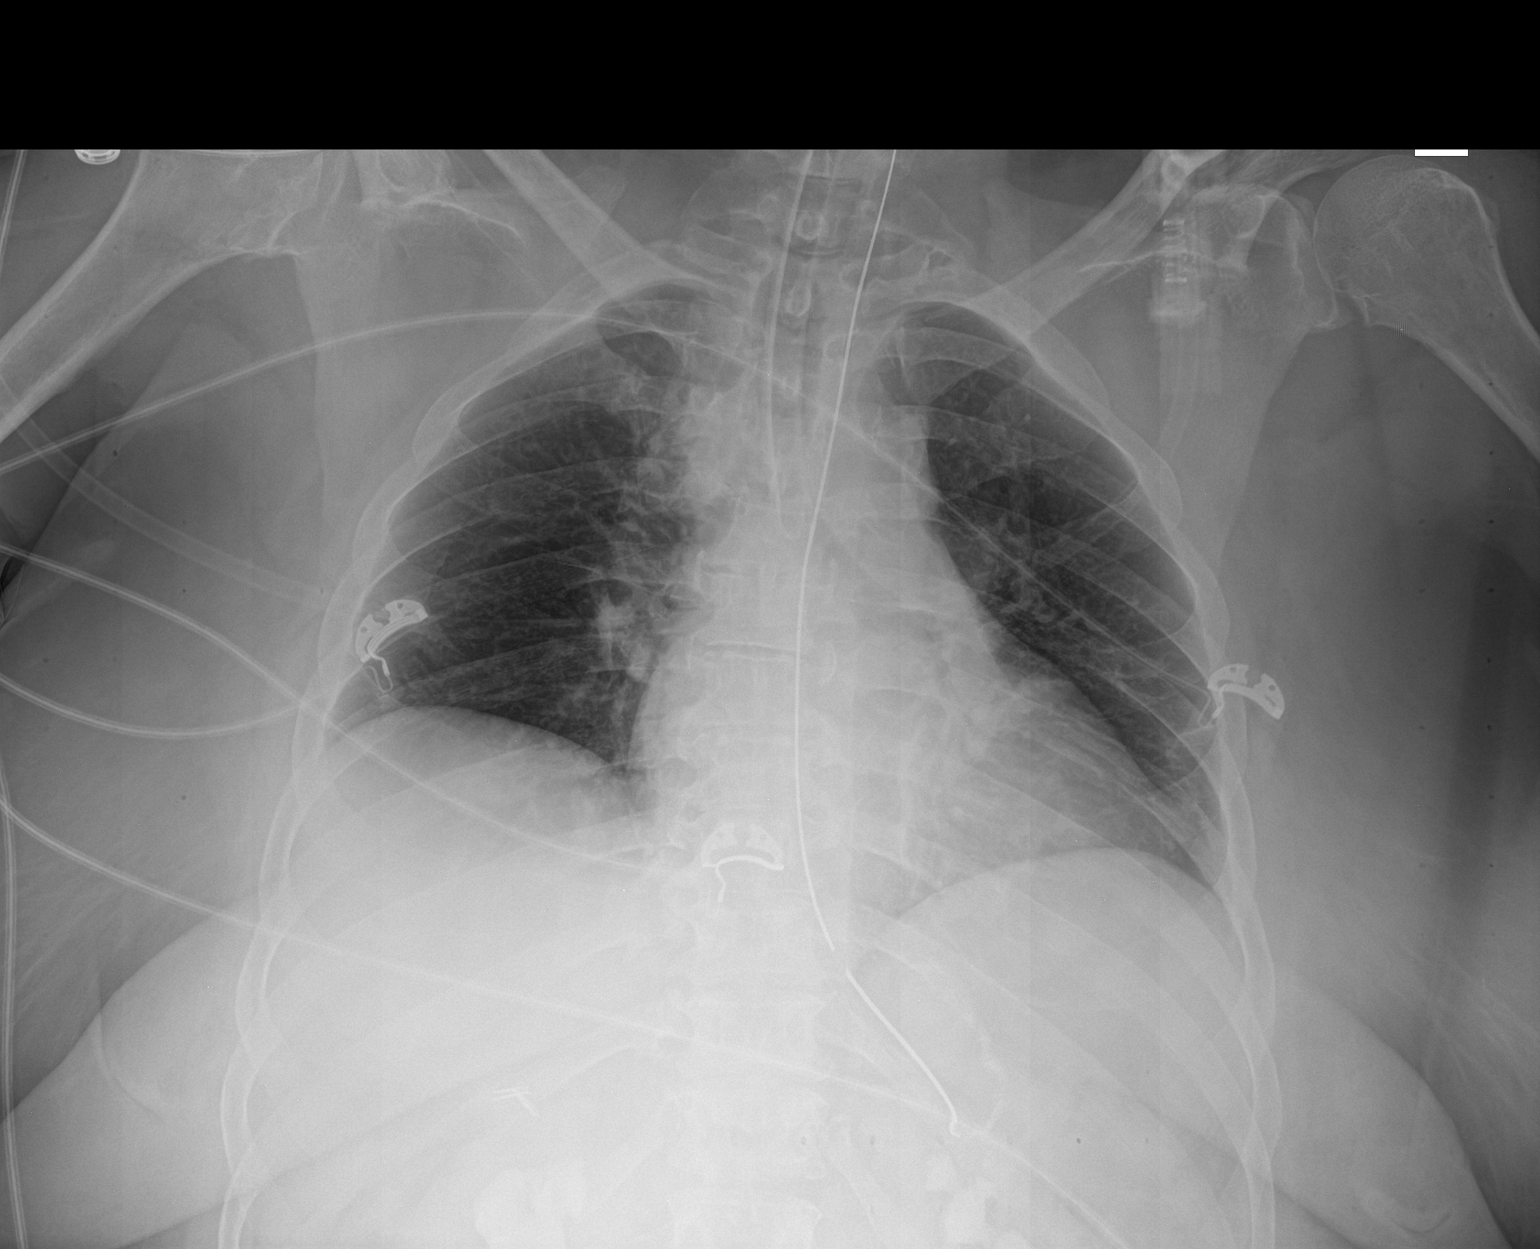

[1 of 1 positions shown; findings below may reference images not displayed]

FINDINGS: The tip of the ET tube appears oriented towards the left mainstem
bronchus. Consider withdrawing by 2 cm. The enteric tube tip is in
the proximal stomach. The side port for the enteric tube is just
above the GE junction. Heart size normal. No pleural effusion,
interstitial edema or airspace consolidation.
IMPRESSION: 1. ET tube tip appears oriented towards the left mainstem bronchus.
Consider withdrawing by 2 cm.
2. Consider advancement of enteric tube.
3. Lungs appear clear.
4. These results will be called to the ordering clinician or
representative by the Radiologist Assistant, and communication
documented in the PACS or [REDACTED].

## 2020-06-30 IMAGING — CT CT HEAD CODE STROKE
3 series · 15 of 47 positions shown, 18 images · non-contrast
Comparison: [REDACTED] [HOSPITAL] [HOSPITAL]
brain MRI [DATE]

CLINICAL DATA: Code stroke. 64-year-old female with right side
weakness, nonverbal.

EXAM:
CT HEAD WITHOUT CONTRAST
TECHNIQUE: Contiguous axial images were obtained from the base of the skull
through the vertex without intravenous contrast.

[Series 2: head 5.0 h30s · axial · 0.44mm/px · z∈[-61,+89]mm · 9 of 36 slices shown, 12 images]
[im 3/36  brain]
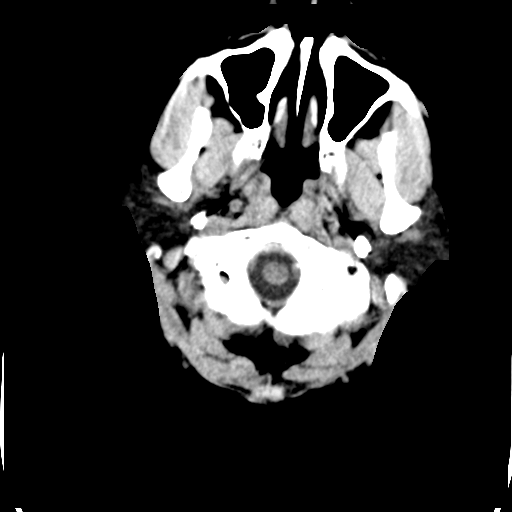
[im 3/36  bone]
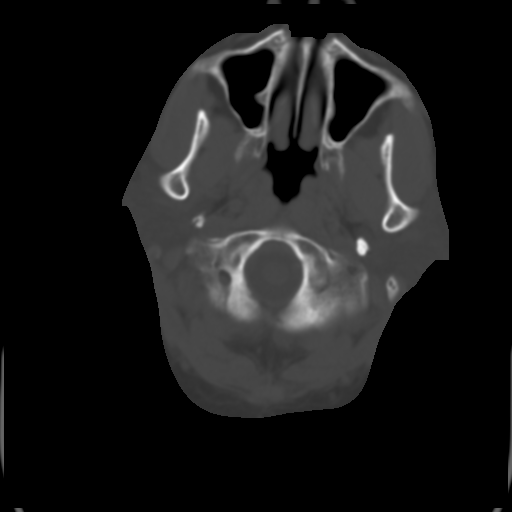
[im 7/36  brain]
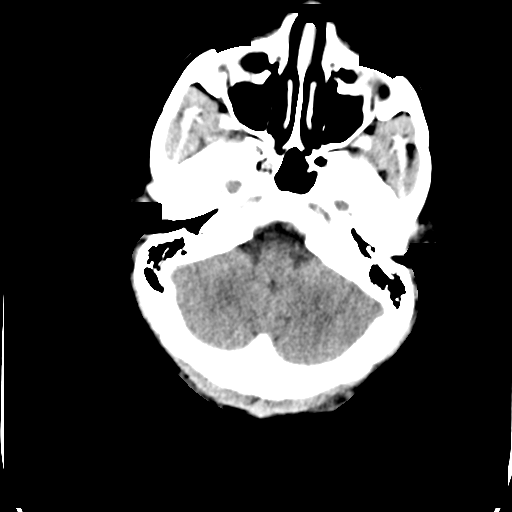
[im 10/36  brain]
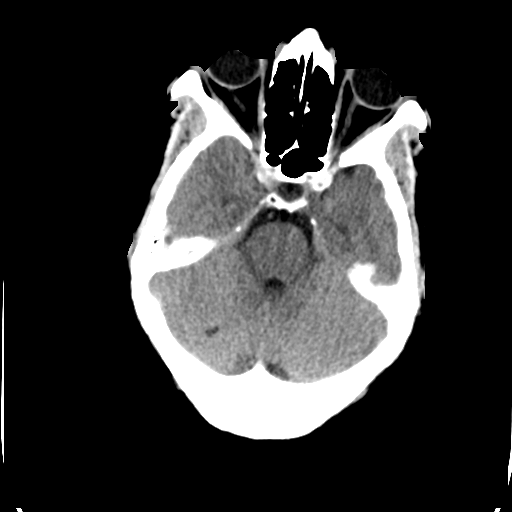
[im 14/36  brain]
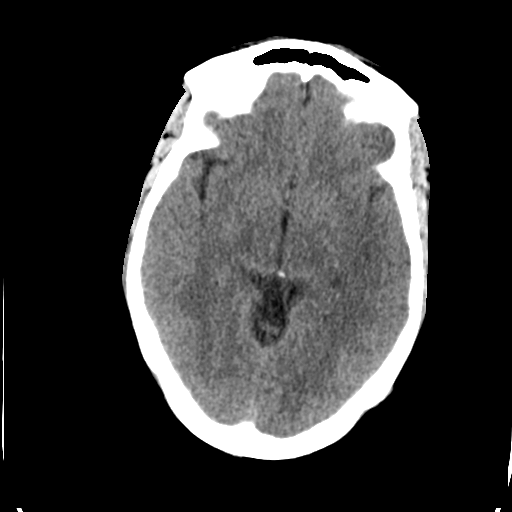
[im 19/36  brain]
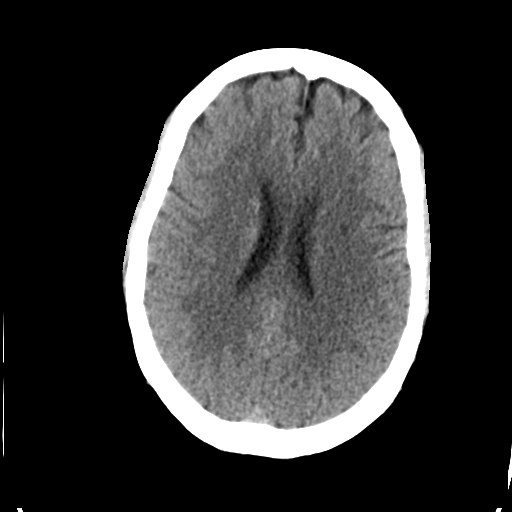
[im 19/36  bone]
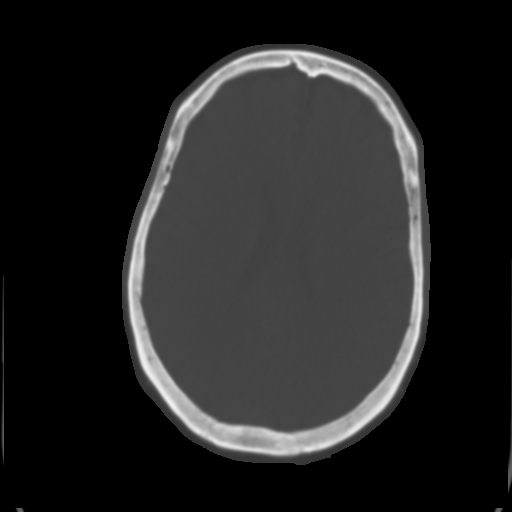
[im 22/36  brain]
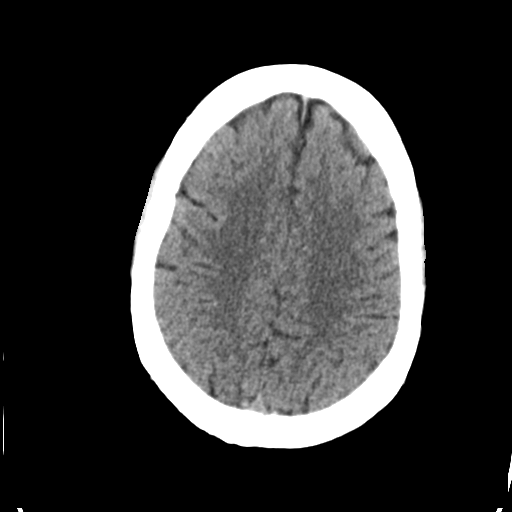
[im 26/36  brain]
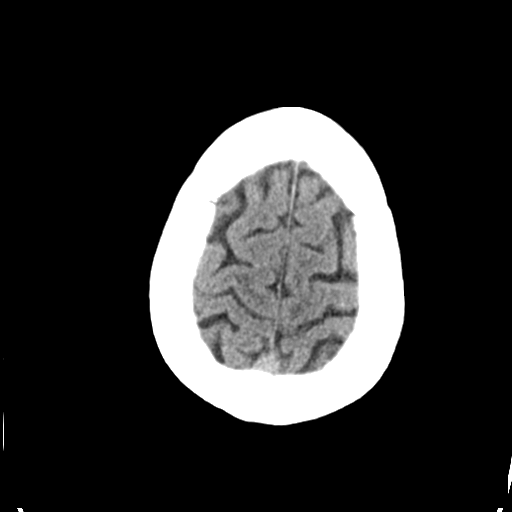
[im 29/36  brain]
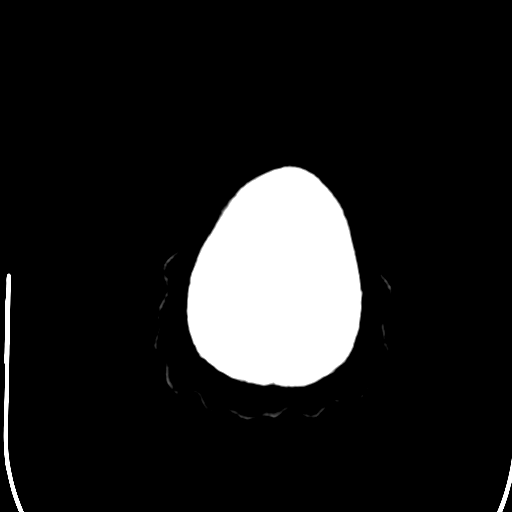
[im 33/36  brain]
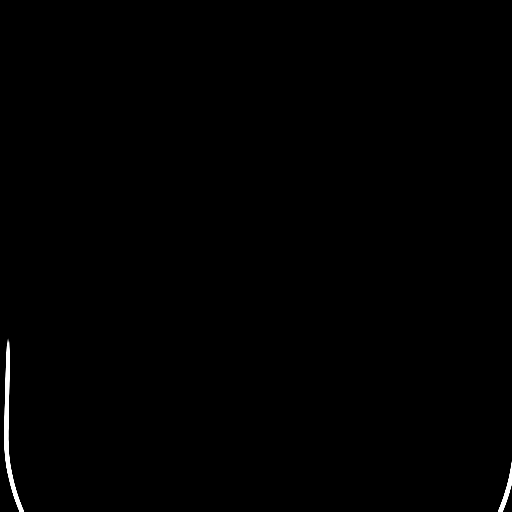
[im 33/36  bone]
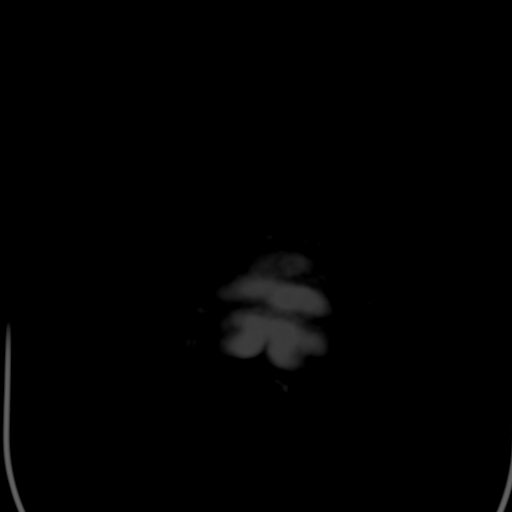

[Series 5: head 3.0 mpr cor · coronal · 0.34mm/px · 3 of 67 slices shown]
[im 23/67  brain]
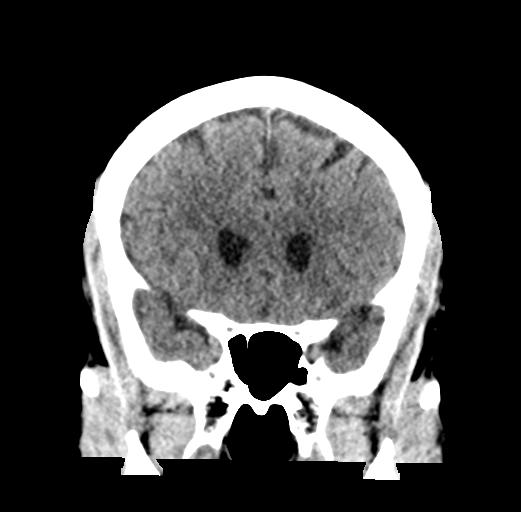
[im 30/67  brain]
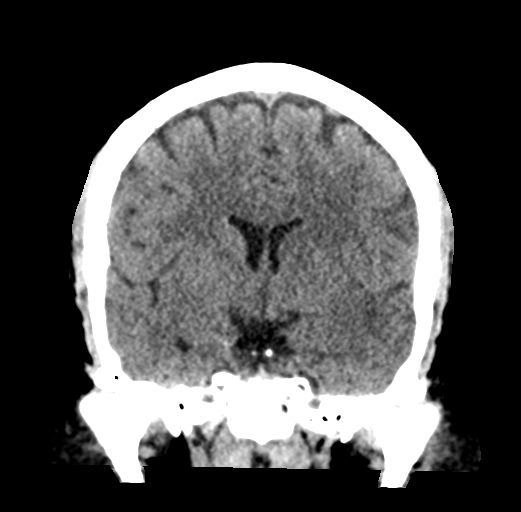
[im 37/67  brain]
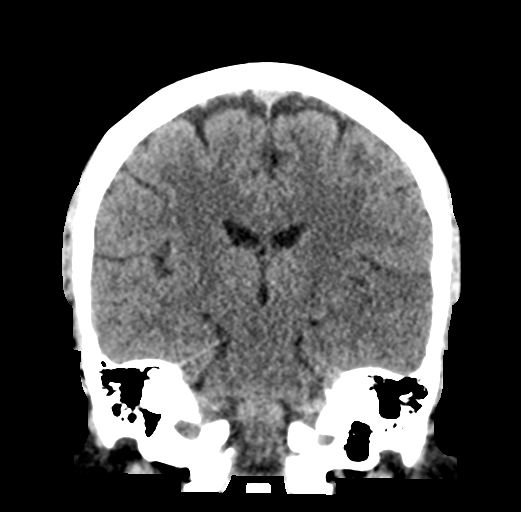

[Series 6: head 3.0 mpr sag · sagittal · 0.34mm/px · 3 of 52 slices shown]
[im 18/52  brain]
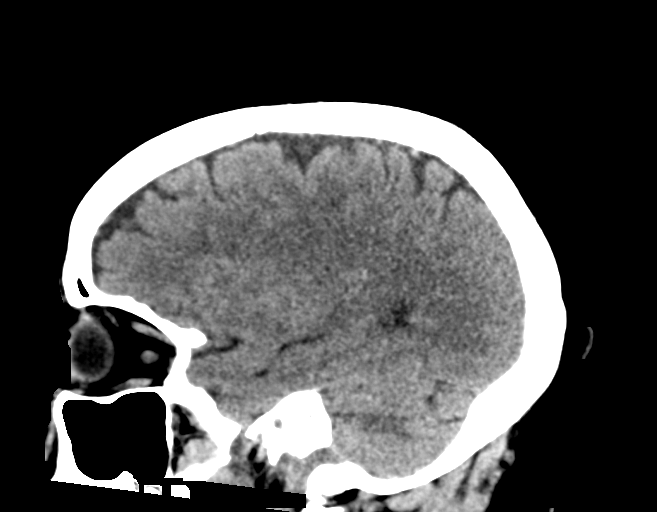
[im 26/52  brain]
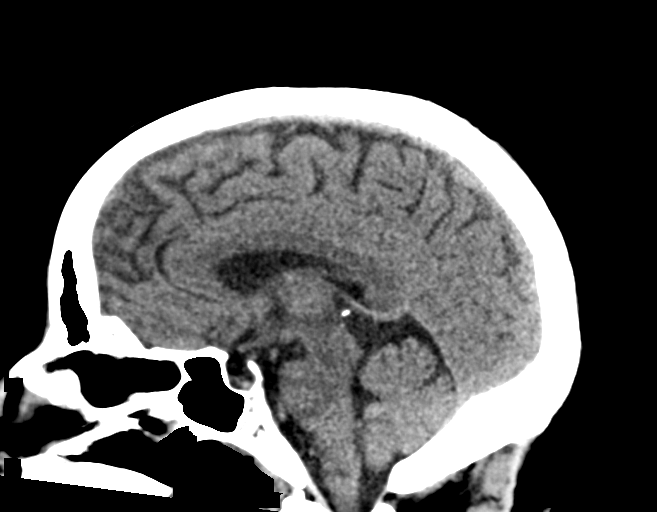
[im 35/52  brain]
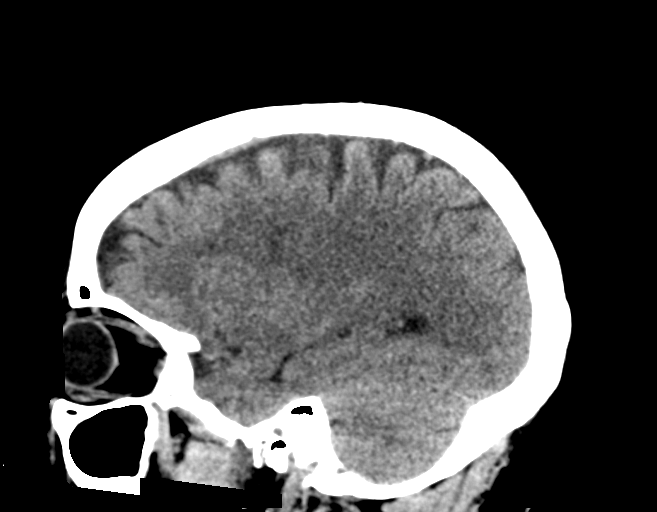

[15 of 47 positions shown; findings below may reference images not displayed]

FINDINGS: Brain: No ventriculomegaly. No midline shift, mass effect, or
evidence of intracranial mass lesion. No acute intracranial
hemorrhage identified.

Asymmetric left MCA territory indistinct gray and white matter
hypodensity compatible with cytotoxic edema (series 2, image 15,
13). Left insula, left M2 and M3 segments affected. Left lentiform
also asymmetric on series 2, image 16.

Contralateral right hemisphere and posterior fossa gray-white matter
differentiation is preserved. There is a small chronic appearing
right cerebellar infarct which is new from [0Z] (series 2, image
10).

No acute intracranial hemorrhage identified.

Vascular: Mild Calcified atherosclerosis at the skull base. No
suspicious intracranial vascular hyperdensity.

Skull: Negative.

Sinuses/Orbits: 6 Visualized paranasal sinuses and mastoids are
clear.

Other: Leftward gaze deviation. Visualized scalp soft tissues are
within normal limits.

ASPECTS (Alberta Stroke Program Early CT Score)

- Ganglionic level infarction (caudate, lentiform nuclei, internal
capsule, insula, M1-M3 cortex): 3

- Supraganglionic infarction (M4-M6 cortex): 3

Total score (0-10 with 10 being normal): 6
IMPRESSION: 1. Acute Left MCA territory infarct. ASPECTS 6. No hemorrhage or
mass effect at this time.
2. The above discussed by telephone with Dr. MINCI on [DATE] at
3. A small chronic appearing right cerebellar infarct is new from

## 2020-06-30 IMAGING — XA IR PERCUTANEOUS ART THORMBECTOMY/INFUSION INTRACRANIAL INCLUDE D
3 series · 10 of 24 positions shown · non-contrast
Comparison: CT angiogram of the head and neck [DATE].

INDICATION: Aphasia, left gaze deviation with right-sided weakness. Occluded
left middle cerebral artery M1 segment on CT angiogram of the head
and neck.

EXAM:
1. EMERGENT LARGE VESSEL OCCLUSION THROMBOLYSIS anterior
CIRCULATION)
TECHNIQUE: Following a full explanation of the procedure along with the
potential associated complications, an informed witnessed consent
was obtained from the patient's daughter. The risks of intracranial
hemorrhage of 10%, worsening neurological deficit, ventilator
dependency, death and inability to revascularize were all reviewed
in detail with the patient's daughter.

[Series 24: <mpr range>2 · axial · 5.0mm · 0.32mm/px · 1 of 35 slices shown]
[im 18/35]
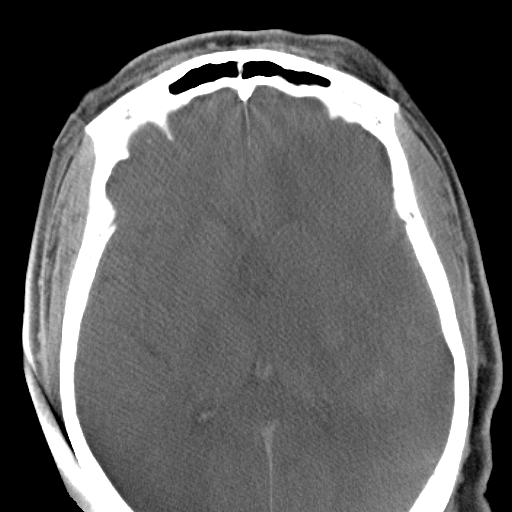

[Series 24: <mpr range>4 · axial · 5.0mm · 0.40mm/px · 1 of 35 slices shown]
[im 1/35]
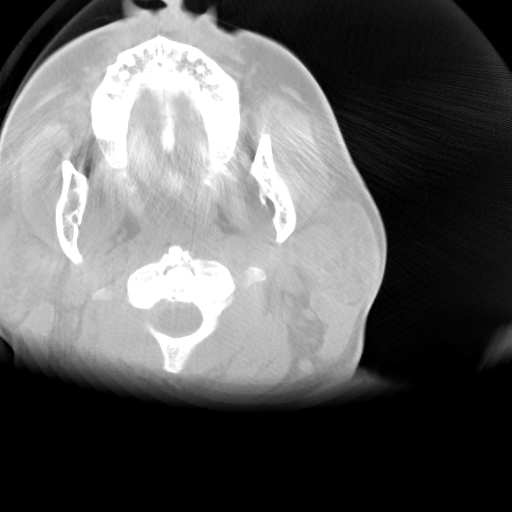

[Series 300: dr. (person_name) · 8 of 212 slices shown]
[im 1/212]
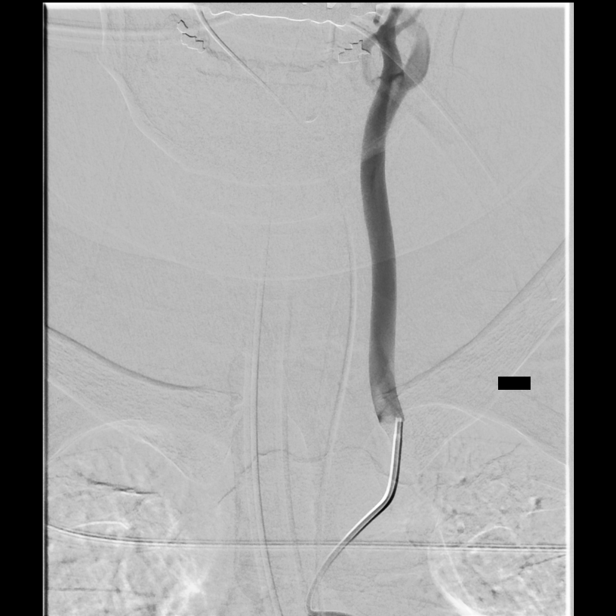
[im 25/212]
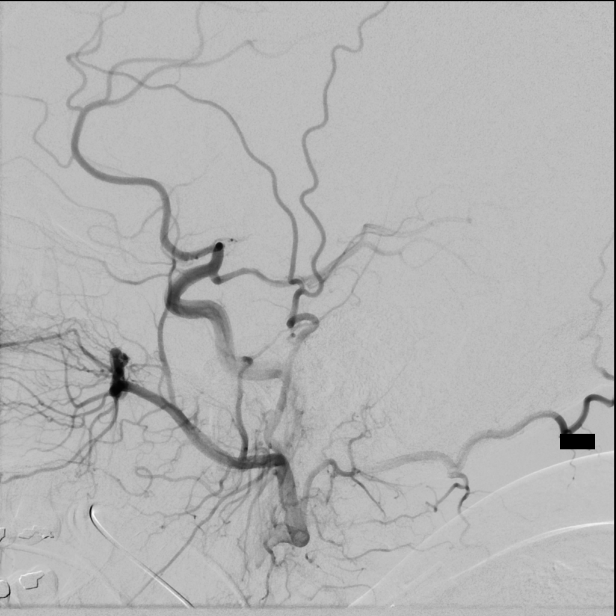
[im 50/212]
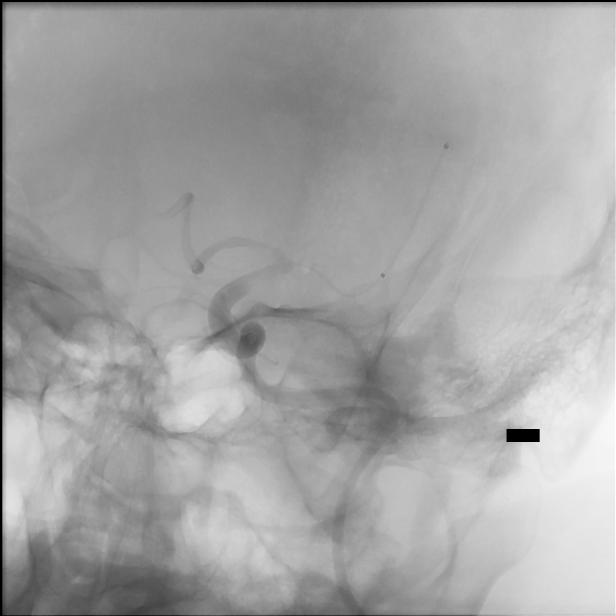
[im 87/212]
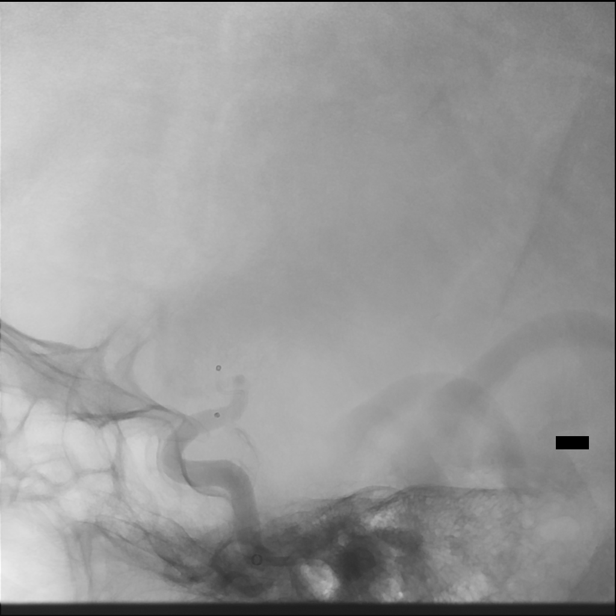
[im 112/212]
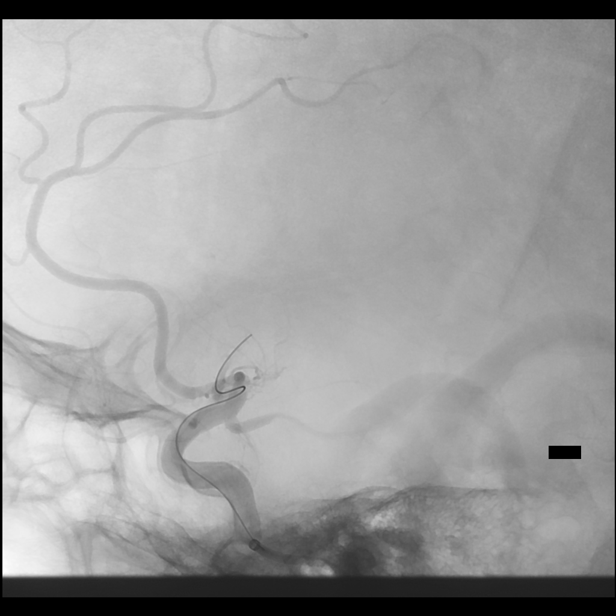
[im 149/212]
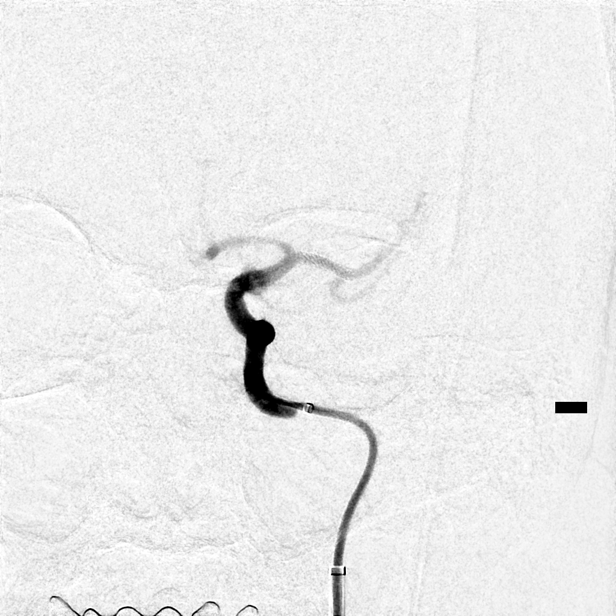
[im 174/212]
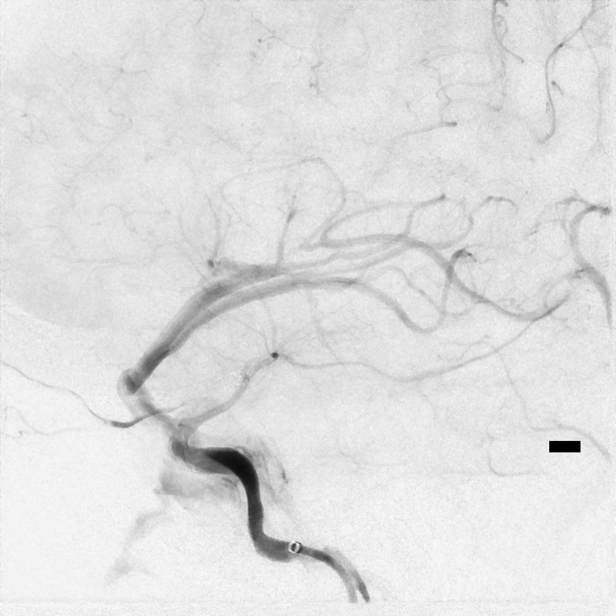
[im 199/212]
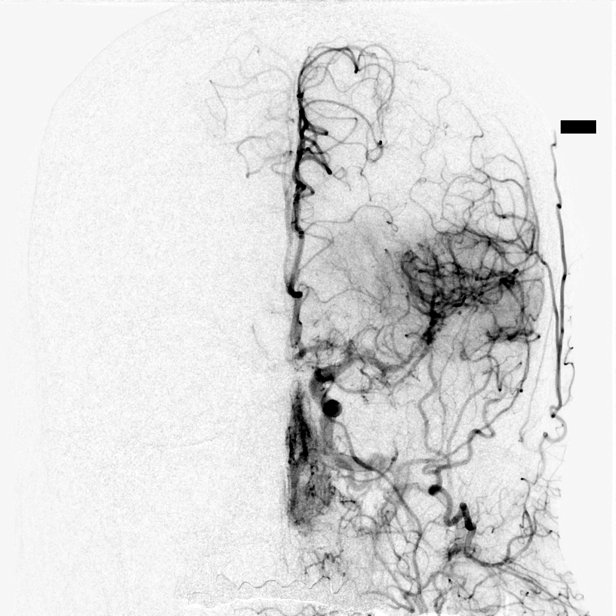

[10 of 24 positions shown; findings below may reference images not displayed]

MEDICATIONS:
Ancef 2 g IV antibiotic was administered within 1 hour of the
procedure.

ANESTHESIA/SEDATION:
General anesthesia

CONTRAST:  Isovue 300 approximately 120 mL

FLUOROSCOPY TIME:  Fluoroscopy Time: 15 minutes 18 seconds ([QP]
mGy).

COMPLICATIONS:
None immediate.
The patient was then put under general anesthesia by the [REDACTED] at [HOSPITAL].

The right groin was prepped and draped in the usual sterile fashion.
Thereafter using modified Seldinger technique, transfemoral access
into the right common femoral artery was obtained without
difficulty. Over a 0.035 inch guidewire an 8 French from 25 cm
Pinnacle sheath was inserted. Through this, and also over a
inch guidewire a combination of SENURI 2 5.5 French support
catheter inside of 087 balloon guide catheter combination was
advanced to the aortic arch, and cannulation was performed of the
left common carotid artery. Over a 0.035 inch glide guidewire, the
support catheter, and the 087 balloon guidewire were advanced to the
left common carotid artery bifurcation. The guidewire and support
SENURI 2 were removed. Good aspiration obtained from the hub of the
balloon guide catheter. Control arteriogram performed through the
balloon guide in the left common carotid artery was performed.
FINDINGS: The left common carotid arteriogram demonstrates the left external
carotid artery and its major branches to be widely patent.

The left internal carotid artery at the bulb to the cranial skull
base demonstrates wide patency with moderate tortuosity in its mid
cervical segment.

The petrous, cavernous and supraclinoid segments demonstrate wide
patency.

Complete occlusion of the left middle cerebral artery at its origin
is demonstrated.

The left anterior cerebral artery demonstrates approximately 50%
stenosis at its A1 segment.

However, flow is noted distally into the left anterior cerebral
artery distribution.

Flash filling of the left posterior communicating artery is also
demonstrated.

PROCEDURE:
Through the balloon guide catheter in the proximal left internal
carotid artery, a combination of an 014 inch standard Synchro micro
guidewire with an 021 Headway microcatheter and an 071 136 cm Zoom
aspiration catheter was advanced as the combination to the
supraclinoid left ICA. The balloon guide was advanced further
distally into the distal cervical left ICA.

Using a torque device, access was obtained into the occluded left
middle cerebral artery into the inferior division branch M2 M3
region followed by the microcatheter. The guidewire was removed.
Good aspiration obtained from the tip of the microcatheter. A gentle
control arteriogram performed through microcatheter demonstrated
safe position of the tip of the microcatheter which was now
connected to continuous heparinized saline infusion.

An 071 Tiger retrieval device was then advanced to the distal end of
the microcatheter.

The O ring on the delivery microcatheter was loosened. The Tiger
retrieval device was retrieved and deployed such that the proximal
marker was just inside the proximal portion of the occluded left
middle cerebral artery. The 071 aspiration catheter was advanced to
just proximal to the origin of the right middle cerebral artery.

Thereafter the retrieval device was expanded and decreased in size
multiple times. Gentle control arteriogram performed through the
aspiration catheter demonstrated thin sliver of patency of the
occluded left middle cerebral artery at its origin indicative of
probably severe intracranial arteriosclerosis.

The retrieval device was again expanded right at the proximal aspect
of the occlusion. Microcatheter was locked into position. Thereafter
as constant aspiration was applied at the hub of the aspiration
catheter at the origin of the left middle cerebral artery, and with
the a 20 mL syringe at the hub of the balloon guide catheter in the
left internal carotid artery with proximal occlusion for about 2
minutes, the combination of the retrieval device, the microcatheter
and the Zoom aspiration device were retrieved and removed. Following
reversal of flow arrest, control arteriogram performed through the
balloon guide in the left internal carotid artery now demonstrated
complete revascularization of the left middle cerebral artery
distribution with a TICI 3 revascularization. However, this also
unmasked a severe high-grade stenosis at the left MCA origin.

At this point through the balloon guide in the left internal carotid
artery, a combination of a 5 French Catalyst guide catheter inside
of which was the 021 microcatheter was advanced over a 0.014 inch
standard Synchro micro guidewire to the supraclinoid left ICA. At
this time there was complete occlusion of the left middle cerebral
artery due to recoil phenomenon due to the atherosclerotic plaque.

Micro guidewire was then gently advanced through the occluded left
middle cerebral artery without difficulty in the superior division
followed by the microcatheter. The guidewire was removed. Good
aspiration obtained from the hub of the microcatheter. A gentle
control arteriogram performed through this demonstrated safe
position of the tip of the microcatheter. This in turn was then
replaced with an 014 inch supported 300 cm Synchro micro guidewire
with a J-tip configuration under constant fluoroscopic guidance. The
tip of the exchange micro guidewire was maintained as the
microcatheter was removed. Measurements were then performed of the
left middle cerebral artery in its most normal segment just distal
to the occlusion.

It was elected to proceed with placement of a 2.25 mm x 12 mm
Synergy drug-eluting stent.

This was a retrogradely prepped with heparinized saline infusion,
and also with 50% contrast and 50% heparinized saline infusion.
Using the rapid exchange technique, the stent delivery system was
advanced without difficulty to the supraclinoid left ICA.

The delivery of the stent was then advanced without difficulty
through the occluded left middle cerebral artery. Its proximal
marker was positioned just proximal to the occluded left middle
cerebral artery proximally. Thereafter, a control inflation was then
performed using micro inflation syringe device via micro tubing with
the balloon inflated to approximately 2.1 mm where it was maintained
for approximately 20 seconds. Thereafter, with the wire distal, the
balloon was retrieved and removed. A control arteriogram performed
through the 5 French guide catheter in the left internal carotid
artery demonstrated complete angiographic revascularization of the
left middle cerebral artery distribution achieving a TICI 3
revascularization.

Also noted now was patency of the left posterior cerebral artery
distribution via the posterior communicating artery.

Control arteriograms were then performed at 15 and 30 minutes post
deployment of the stent which continued to demonstrate excellent
flow through the stented segment also with patency of the superior
and inferior division branches into the more distal M4 and M5
regions of the left middle cerebral artery distribution.

Final control arteriogram performed through the balloon guide in the
left internal carotid artery after removal of the 5 French Catalyst
guide catheter, and also the exchange micro guidewire demonstrated
continued excellent apposition and patency of the angioplastied
segment of the left middle cerebral artery with no intra stent
abnormalities. A distal perfusion was now noted into the M4 M5
regions.

Moderate spasm at the middle cervical left ICA responded to 25 mcg
of nitroglycerin intra-arterially.

The left anterior cerebral artery remained widely patent with cross
filling of the right anterior cerebral A2 segment as described
earlier.

Balloon guide was removed. The Pinnacle sheath was removed with
successful hemostasis with an 8 French Angio-Seal closure device.
Distal pulses remained Dopplerable in both feet unchanged.

Patient was left intubated to protect her airway as per anesthesia.

An immediate CT scan of the brain performed on the table
demonstrated contrast stain in the left basal ganglia region, and
also the left parietal subcortical area.

Patient was loaded with aspirin 81 mg, and Brilinta 180 mg p.o. via
an orogastric tube just prior to the balloon angioplasty.

A loading dose of cangrelor IV was given right after placement of
the stent, with a 4 hr low-dose infusion with a CT of the brain to
follow.

The patient's pupils were approximately 2 mm bilaterally though
sluggish.

The patient was then transferred to the PACU and then neuro ICU for
post revascularization treatment.
IMPRESSION: Status post endovascular complete revascularization of occluded left
middle cerebral artery M1 segment with 1 pass with the Tiger 17
retrieval device and proximal aspiration followed by placement of a
2.25 mm x 12 mm Synergy balloon mounted drug-eluting stent with
achievement of a TICI 3 revascularization.

PLAN:
Follow-up in the clinic approximately 4 weeks post discharge.

## 2020-06-30 IMAGING — CT CT HEAD W/O CM
3 series · 15 of 47 positions shown, 18 images · non-contrast
Comparison: Earlier same day

CLINICAL DATA: Stroke, left MCA occlusion post thrombectomy and
stenting

EXAM:
CT HEAD WITHOUT CONTRAST
TECHNIQUE: Contiguous axial images were obtained from the base of the skull
through the vertex without intravenous contrast.

[Series 3: head 5.0 h30s · axial · 0.42mm/px · z∈[-124,+1]mm · 9 of 31 slices shown, 12 images]
[im 3/31  brain]
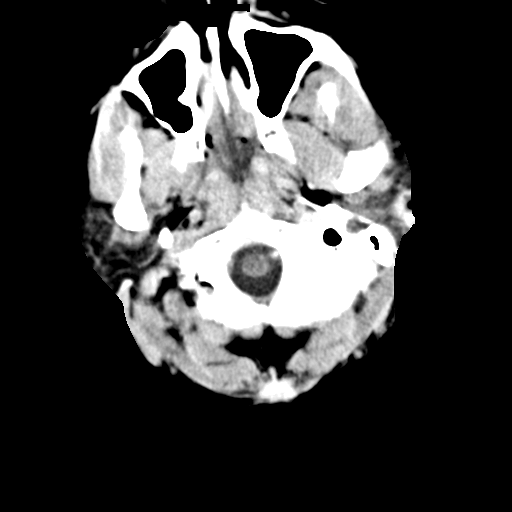
[im 3/31  bone]
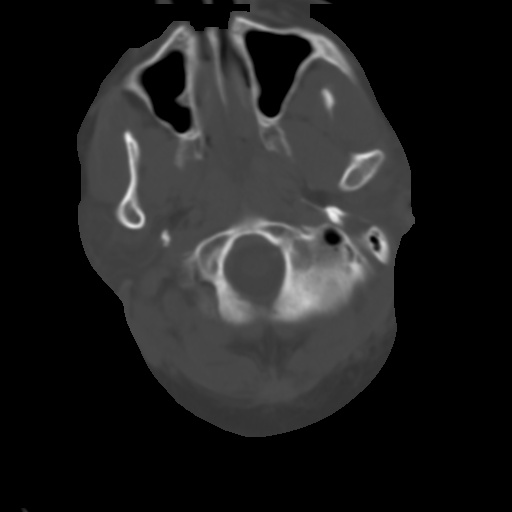
[im 6/31  brain]
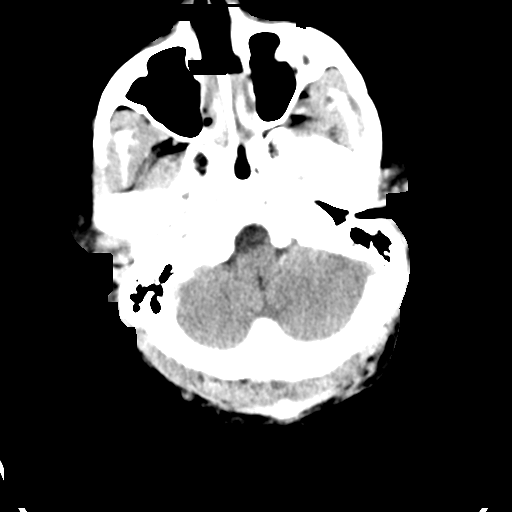
[im 9/31  brain]
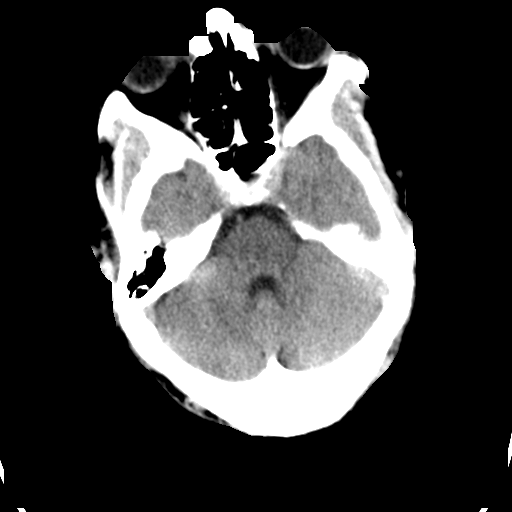
[im 12/31  brain]
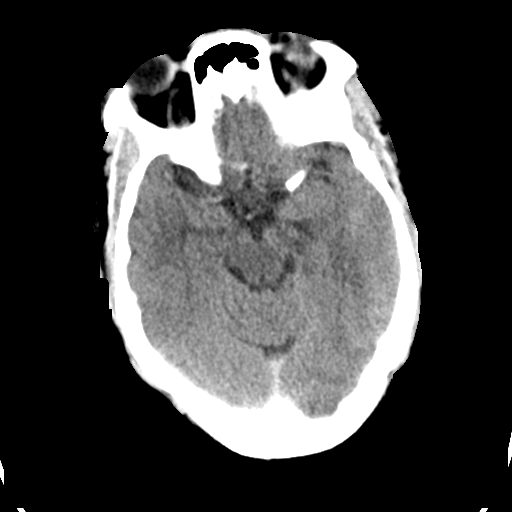
[im 16/31  brain]
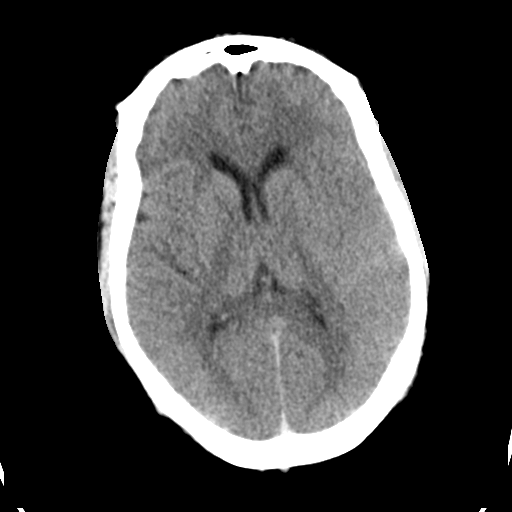
[im 16/31  bone]
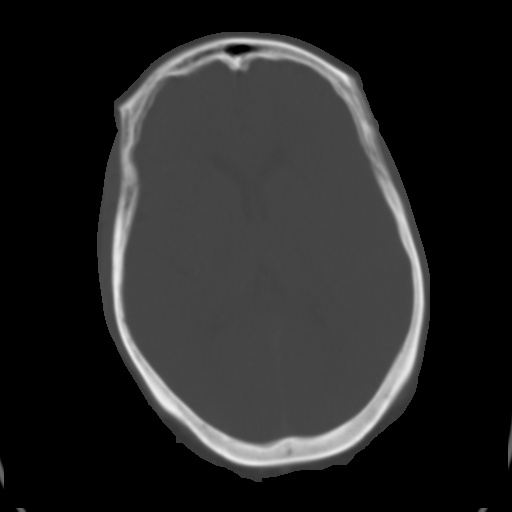
[im 19/31  brain]
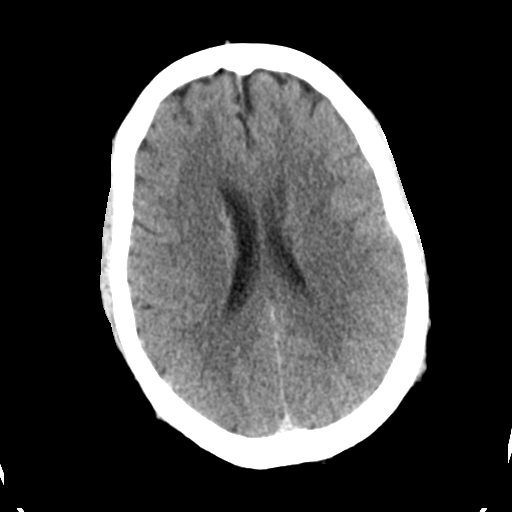
[im 22/31  brain]
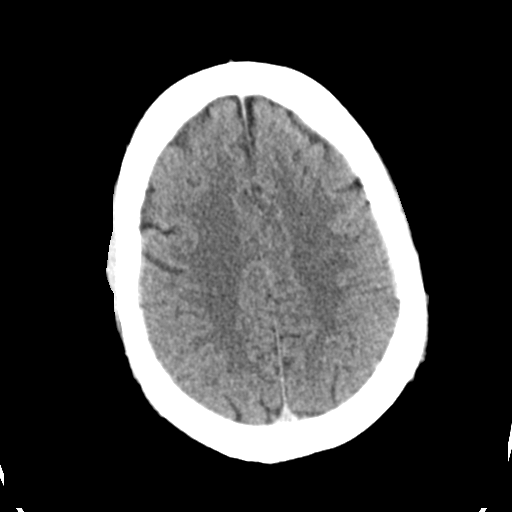
[im 25/31  brain]
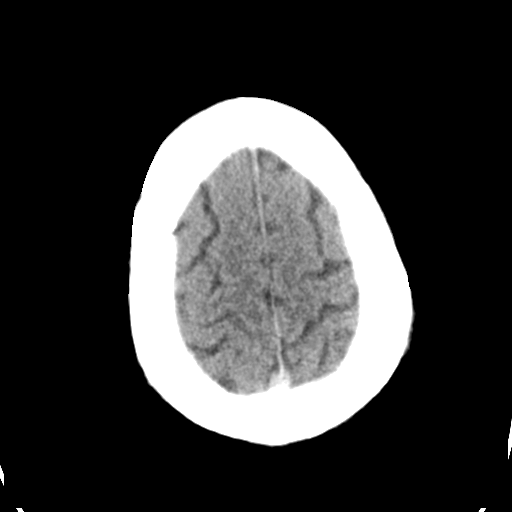
[im 28/31  brain]
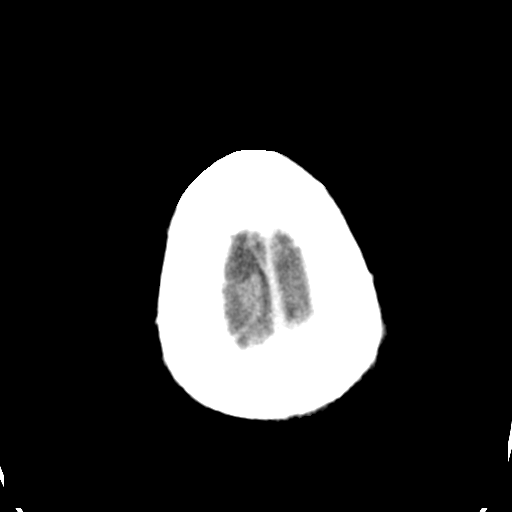
[im 28/31  bone]
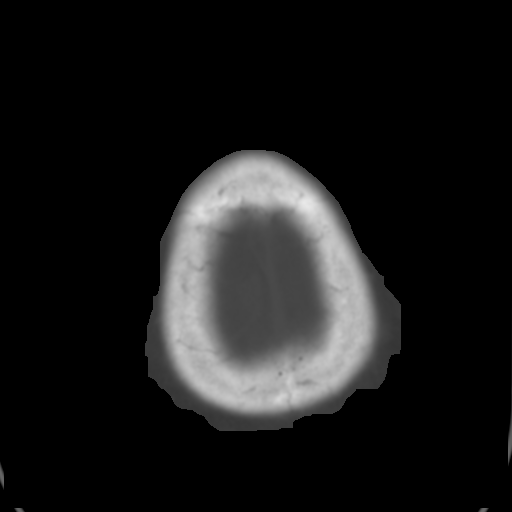

[Series 5: head 3.0 mpr cor · coronal · 0.29mm/px · 3 of 67 slices shown]
[im 23/67  brain]
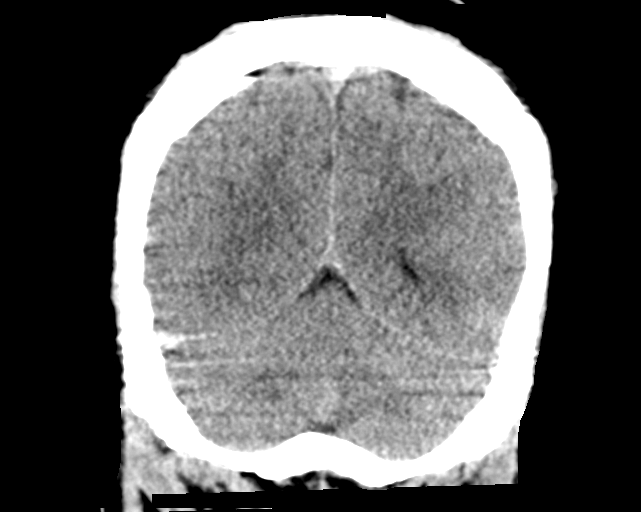
[im 30/67  brain]
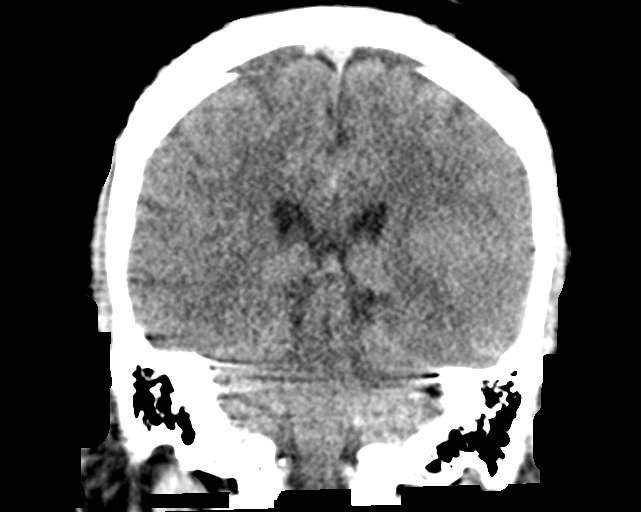
[im 37/67  brain]
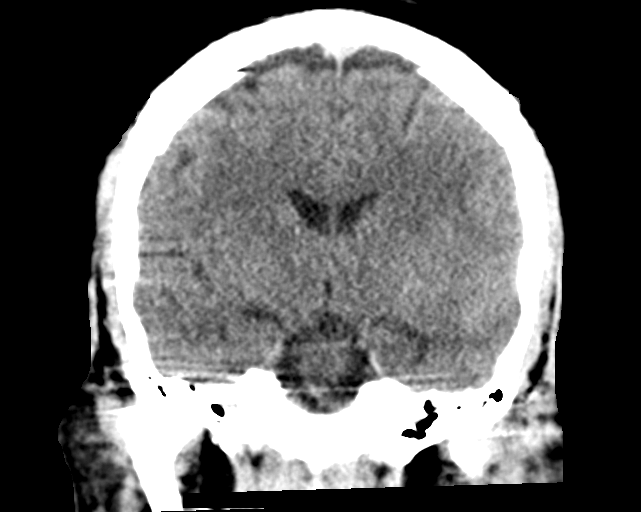

[Series 6: head 3.0 mpr sag · sagittal · 0.29mm/px · 3 of 67 slices shown]
[im 23/67  brain]
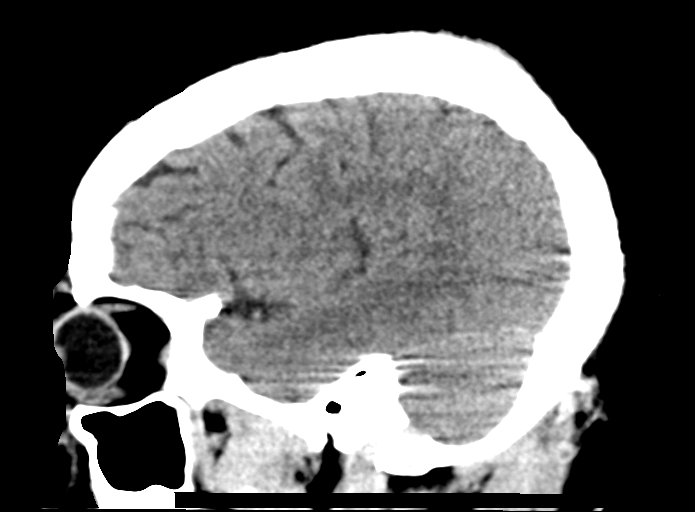
[im 34/67  brain]
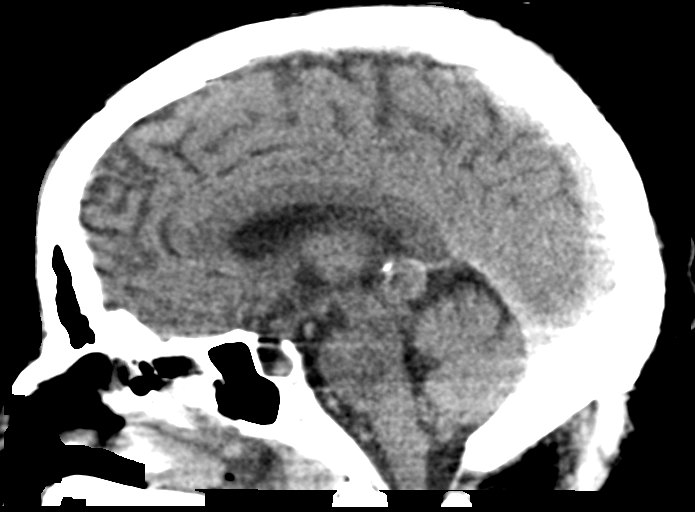
[im 45/67  brain]
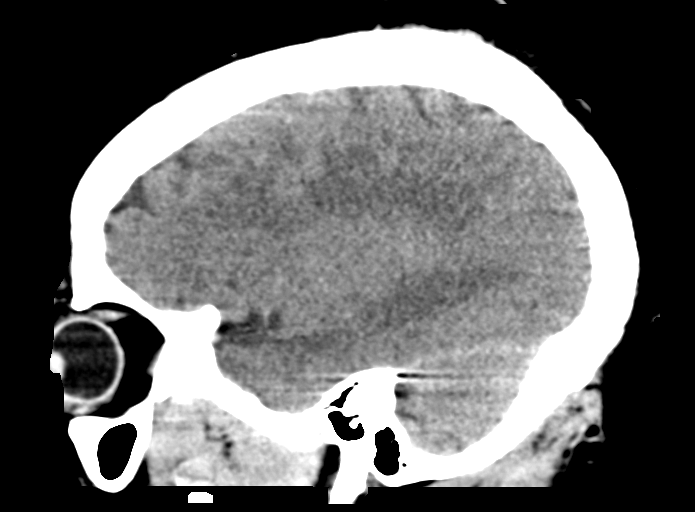

[15 of 47 positions shown; findings below may reference images not displayed]

FINDINGS: Brain: Probable few patchy areas of loss of gray-white
differentiation are identified in the left MCA territory. This is
less apparent than on the prior study. There is some ill-defined
patchy hyperdensity, most discrete in the superior left temporal
lobe on axial series 3, image 14. There is no significant mass
effect. No hydrocephalus. Small chronic right cerebellar infarct.

Vascular: New stent of the left M1 MCA.

Skull: Calvarium is unremarkable.

Sinuses/Orbits: No acute finding.

Other: None.
IMPRESSION: Probable few small areas of acute left MCA infarction as before but
less apparent. Ill-defined patchy hyperdensity may reflect contrast
staining and/or reperfusion petechial hemorrhage. No significant
mass effect.

## 2020-06-30 SURGERY — RADIOLOGY WITH ANESTHESIA
Anesthesia: General

## 2020-06-30 MED ORDER — IOHEXOL 300 MG/ML  SOLN
150.0000 mL | Freq: Once | INTRAMUSCULAR | Status: AC | PRN
  Administered 2020-06-30: 75 mL via INTRA_ARTERIAL

## 2020-06-30 MED ORDER — STERILE WATER FOR INJECTION IV SOLN
INTRAVENOUS | Status: DC
  Filled 2020-06-30: qty 850
  Filled 2020-06-30: qty 150

## 2020-06-30 MED ORDER — PANTOPRAZOLE SODIUM 40 MG IV SOLR
40.0000 mg | Freq: Every day | INTRAVENOUS | Status: DC
  Administered 2020-07-01 – 2020-07-04 (×4): 40 mg via INTRAVENOUS
  Filled 2020-06-30 (×4): qty 40

## 2020-06-30 MED ORDER — IOHEXOL 300 MG/ML  SOLN: 50.0000 mL | Freq: Once | INTRAMUSCULAR | Status: DC | PRN

## 2020-06-30 MED ORDER — IOHEXOL 350 MG/ML SOLN
100.0000 mL | Freq: Once | INTRAVENOUS | Status: AC | PRN
  Administered 2020-06-30: 100 mL via INTRAVENOUS

## 2020-06-30 MED ORDER — MIDAZOLAM HCL 2 MG/2ML IJ SOLN: 1.0000 mg | INTRAMUSCULAR | Status: DC | PRN

## 2020-06-30 MED ORDER — LIDOCAINE 2% (20 MG/ML) 5 ML SYRINGE
INTRAMUSCULAR | Status: DC | PRN
  Administered 2020-06-30: 60 mg via INTRAVENOUS

## 2020-06-30 MED ORDER — PROPOFOL 10 MG/ML IV BOLUS
INTRAVENOUS | Status: DC | PRN
  Administered 2020-06-30: 10 mg via INTRAVENOUS
  Administered 2020-06-30: 20 mg via INTRAVENOUS
  Administered 2020-06-30: 150 mg via INTRAVENOUS

## 2020-06-30 MED ORDER — DOCUSATE SODIUM 50 MG/5ML PO LIQD
100.0000 mg | Freq: Two times a day (BID) | ORAL | Status: DC
  Administered 2020-06-30 – 2020-07-09 (×14): 100 mg
  Filled 2020-06-30 (×14): qty 10

## 2020-06-30 MED ORDER — CLEVIDIPINE BUTYRATE 0.5 MG/ML IV EMUL
INTRAVENOUS | Status: AC
  Filled 2020-06-30: qty 50

## 2020-06-30 MED ORDER — PHENYLEPHRINE 40 MCG/ML (10ML) SYRINGE FOR IV PUSH (FOR BLOOD PRESSURE SUPPORT)
PREFILLED_SYRINGE | INTRAVENOUS | Status: DC | PRN
  Administered 2020-06-30 (×2): 80 ug via INTRAVENOUS
  Administered 2020-06-30 (×2): 40 ug via INTRAVENOUS

## 2020-06-30 MED ORDER — ACETAMINOPHEN 160 MG/5ML PO SOLN: 650.0000 mg | ORAL | Status: DC | PRN

## 2020-06-30 MED ORDER — IOHEXOL 300 MG/ML  SOLN
50.0000 mL | Freq: Once | INTRAMUSCULAR | Status: AC | PRN
  Administered 2020-06-30: 25 mL via INTRA_ARTERIAL

## 2020-06-30 MED ORDER — SODIUM CHLORIDE 0.9 % IV SOLN: INTRAVENOUS | Status: DC

## 2020-06-30 MED ORDER — NITROGLYCERIN 1 MG/10 ML FOR IR/CATH LAB
INTRA_ARTERIAL | Status: AC
  Filled 2020-06-30: qty 10

## 2020-06-30 MED ORDER — PROPOFOL 1000 MG/100ML IV EMUL
0.0000 ug/kg/min | INTRAVENOUS | Status: DC
  Administered 2020-06-30: 50 ug/kg/min via INTRAVENOUS
  Filled 2020-06-30 (×2): qty 100

## 2020-06-30 MED ORDER — CEFAZOLIN SODIUM-DEXTROSE 2-4 GM/100ML-% IV SOLN
INTRAVENOUS | Status: AC
  Filled 2020-06-30: qty 100

## 2020-06-30 MED ORDER — TICAGRELOR 60 MG PO TABS
ORAL_TABLET | ORAL | Status: AC | PRN
  Administered 2020-06-30: 180 mg via NASOGASTRIC

## 2020-06-30 MED ORDER — ACETAMINOPHEN 650 MG RE SUPP: 650.0000 mg | RECTAL | Status: DC | PRN

## 2020-06-30 MED ORDER — TICAGRELOR 90 MG PO TABS
ORAL_TABLET | ORAL | Status: AC
  Filled 2020-06-30: qty 2

## 2020-06-30 MED ORDER — ONDANSETRON HCL 4 MG/2ML IJ SOLN
INTRAMUSCULAR | Status: DC | PRN
  Administered 2020-06-30: 4 mg via INTRAVENOUS

## 2020-06-30 MED ORDER — ASPIRIN 81 MG PO CHEW
81.0000 mg | CHEWABLE_TABLET | Freq: Every day | ORAL | Status: DC
  Administered 2020-07-01 – 2020-07-09 (×8): 81 mg
  Filled 2020-06-30 (×8): qty 1

## 2020-06-30 MED ORDER — FENTANYL CITRATE (PF) 100 MCG/2ML IJ SOLN
25.0000 ug | INTRAMUSCULAR | Status: DC | PRN
Start: 2020-06-30 — End: 2020-07-01

## 2020-06-30 MED ORDER — ATORVASTATIN CALCIUM 10 MG PO TABS
80.0000 mg | ORAL_TABLET | Freq: Every day | ORAL | Status: DC
  Administered 2020-06-30: 80 mg via ORAL
  Filled 2020-06-30: qty 8

## 2020-06-30 MED ORDER — SODIUM CHLORIDE (PF) 0.9 % IJ SOLN
INTRAVENOUS | Status: AC | PRN
  Administered 2020-06-30: 25 ug via INTRA_ARTERIAL

## 2020-06-30 MED ORDER — CLEVIDIPINE BUTYRATE 0.5 MG/ML IV EMUL
0.0000 mg/h | INTRAVENOUS | Status: AC
  Administered 2020-07-01: 4 mg/h via INTRAVENOUS
  Filled 2020-06-30: qty 50

## 2020-06-30 MED ORDER — ORAL CARE MOUTH RINSE
15.0000 mL | OROMUCOSAL | Status: DC
  Administered 2020-06-30 – 2020-07-01 (×6): 15 mL via OROMUCOSAL

## 2020-06-30 MED ORDER — ASPIRIN 81 MG PO CHEW
CHEWABLE_TABLET | ORAL | Status: AC
  Filled 2020-06-30: qty 1

## 2020-06-30 MED ORDER — ACETAMINOPHEN 325 MG PO TABS: 650.0000 mg | ORAL_TABLET | ORAL | Status: DC | PRN

## 2020-06-30 MED ORDER — SENNOSIDES-DOCUSATE SODIUM 8.6-50 MG PO TABS
1.0000 | ORAL_TABLET | Freq: Every day | ORAL | Status: DC
  Administered 2020-06-30: 1 via ORAL
  Filled 2020-06-30: qty 1

## 2020-06-30 MED ORDER — PHENYLEPHRINE HCL-NACL 10-0.9 MG/250ML-% IV SOLN
25.0000 ug/min | INTRAVENOUS | Status: DC
  Administered 2020-06-30: 25 ug/min via INTRAVENOUS

## 2020-06-30 MED ORDER — ASPIRIN 81 MG PO CHEW
81.0000 mg | CHEWABLE_TABLET | Freq: Every day | ORAL | Status: DC
  Filled 2020-06-30: qty 1

## 2020-06-30 MED ORDER — TICAGRELOR 90 MG PO TABS
90.0000 mg | ORAL_TABLET | Freq: Two times a day (BID) | ORAL | Status: DC
  Administered 2020-06-30 – 2020-07-09 (×16): 90 mg
  Filled 2020-06-30 (×15): qty 1

## 2020-06-30 MED ORDER — CANGRELOR BOLUS VIA INFUSION
INTRAVENOUS | Status: AC | PRN
  Administered 2020-06-30: 1497 ug via INTRAVENOUS

## 2020-06-30 MED ORDER — SODIUM CHLORIDE 0.9 % IV SOLN: INTRAVENOUS | Status: DC | PRN

## 2020-06-30 MED ORDER — SENNOSIDES-DOCUSATE SODIUM 8.6-50 MG PO TABS
1.0000 | ORAL_TABLET | Freq: Every day | ORAL | Status: DC
  Administered 2020-07-01 – 2020-07-08 (×7): 1
  Filled 2020-06-30 (×7): qty 1

## 2020-06-30 MED ORDER — STROKE: EARLY STAGES OF RECOVERY BOOK
Freq: Once | Status: AC
  Filled 2020-06-30: qty 1

## 2020-06-30 MED ORDER — ASPIRIN 81 MG PO CHEW
CHEWABLE_TABLET | ORAL | Status: AC | PRN
  Administered 2020-06-30: 81 mg via NASOGASTRIC

## 2020-06-30 MED ORDER — EPTIFIBATIDE 20 MG/10ML IV SOLN
INTRAVENOUS | Status: AC
  Filled 2020-06-30: qty 10

## 2020-06-30 MED ORDER — SUCCINYLCHOLINE CHLORIDE 200 MG/10ML IV SOSY
PREFILLED_SYRINGE | INTRAVENOUS | Status: DC | PRN
  Administered 2020-06-30: 120 mg via INTRAVENOUS

## 2020-06-30 MED ORDER — EPHEDRINE SULFATE-NACL 50-0.9 MG/10ML-% IV SOSY
PREFILLED_SYRINGE | INTRAVENOUS | Status: DC | PRN
  Administered 2020-06-30 (×2): 5 mg via INTRAVENOUS

## 2020-06-30 MED ORDER — INSULIN ASPART 100 UNIT/ML ~~LOC~~ SOLN
0.0000 [IU] | Freq: Three times a day (TID) | SUBCUTANEOUS | Status: DC
  Administered 2020-07-02 (×2): 2 [IU] via SUBCUTANEOUS

## 2020-06-30 MED ORDER — SODIUM CHLORIDE 0.9% FLUSH: 3.0000 mL | Freq: Once | INTRAVENOUS | Status: DC

## 2020-06-30 MED ORDER — DEXAMETHASONE SODIUM PHOSPHATE 10 MG/ML IJ SOLN
INTRAMUSCULAR | Status: DC | PRN
  Administered 2020-06-30: 4 mg via INTRAVENOUS

## 2020-06-30 MED ORDER — CEFAZOLIN SODIUM-DEXTROSE 2-3 GM-%(50ML) IV SOLR
INTRAVENOUS | Status: DC | PRN
  Administered 2020-06-30: 2 g via INTRAVENOUS

## 2020-06-30 MED ORDER — CANGRELOR TETRASODIUM 50 MG IV SOLR
INTRAVENOUS | Status: AC
  Filled 2020-06-30: qty 50

## 2020-06-30 MED ORDER — POLYETHYLENE GLYCOL 3350 17 G PO PACK
17.0000 g | PACK | Freq: Every day | ORAL | Status: DC
  Administered 2020-07-02 – 2020-07-09 (×6): 17 g
  Filled 2020-06-30 (×6): qty 1

## 2020-06-30 MED ORDER — CHLORHEXIDINE GLUCONATE 0.12% ORAL RINSE (MEDLINE KIT)
15.0000 mL | Freq: Two times a day (BID) | OROMUCOSAL | Status: DC
  Administered 2020-06-30 – 2020-07-09 (×18): 15 mL via OROMUCOSAL

## 2020-06-30 MED ORDER — PHENYLEPHRINE HCL-NACL 10-0.9 MG/250ML-% IV SOLN
INTRAVENOUS | Status: DC | PRN
  Administered 2020-06-30: 50 ug/min via INTRAVENOUS

## 2020-06-30 MED ORDER — TICAGRELOR 90 MG PO TABS
90.0000 mg | ORAL_TABLET | Freq: Two times a day (BID) | ORAL | Status: DC
  Filled 2020-06-30 (×4): qty 1

## 2020-06-30 MED ORDER — ATORVASTATIN CALCIUM 80 MG PO TABS
80.0000 mg | ORAL_TABLET | Freq: Every day | ORAL | Status: DC
  Administered 2020-07-01 – 2020-07-08 (×7): 80 mg
  Filled 2020-06-30 (×8): qty 1

## 2020-06-30 MED ORDER — CHLORHEXIDINE GLUCONATE CLOTH 2 % EX PADS
6.0000 | MEDICATED_PAD | Freq: Every day | CUTANEOUS | Status: DC
  Administered 2020-07-01 – 2020-07-08 (×7): 6 via TOPICAL

## 2020-06-30 MED ORDER — SODIUM CHLORIDE 0.9 % IV SOLN
INTRAVENOUS | Status: AC | PRN
  Administered 2020-06-30: 2 ug/kg/min
  Administered 2020-06-30: 2 ug/kg/min via INTRAVENOUS

## 2020-06-30 MED ORDER — CLEVIDIPINE BUTYRATE 0.5 MG/ML IV EMUL
INTRAVENOUS | Status: DC | PRN
Start: 2020-06-30 — End: 2020-06-30
  Administered 2020-06-30: 1 mg/h via INTRAVENOUS

## 2020-06-30 MED ORDER — ACETAMINOPHEN 160 MG/5ML PO SOLN
650.0000 mg | ORAL | Status: DC | PRN
  Administered 2020-07-02 – 2020-07-08 (×5): 650 mg
  Filled 2020-06-30 (×5): qty 20.3

## 2020-06-30 MED ORDER — IOHEXOL 300 MG/ML  SOLN
150.0000 mL | Freq: Once | INTRAMUSCULAR | Status: AC | PRN
  Administered 2020-06-30: 40 mL via INTRA_ARTERIAL

## 2020-06-30 MED ORDER — FENTANYL CITRATE (PF) 100 MCG/2ML IJ SOLN
25.0000 ug | INTRAMUSCULAR | Status: DC | PRN
  Filled 2020-06-30: qty 2

## 2020-06-30 MED ORDER — ROCURONIUM BROMIDE 10 MG/ML (PF) SYRINGE
PREFILLED_SYRINGE | INTRAVENOUS | Status: DC | PRN
  Administered 2020-06-30: 20 mg via INTRAVENOUS
  Administered 2020-06-30: 30 mg via INTRAVENOUS
  Administered 2020-06-30: 50 mg via INTRAVENOUS

## 2020-06-30 MED ORDER — ONDANSETRON HCL 4 MG/2ML IJ SOLN
INTRAMUSCULAR | Status: AC
  Filled 2020-06-30: qty 2

## 2020-06-30 NOTE — ED Triage Notes (Signed)
To ED via [facility] from home--- was found outside by daughter this am, in pajamas, not "acting right" was assisted into house, able to walk, then assisted to floor-- was flaccid on right sidewith gaze to left- is able to intermittently move all extremities, nonverbal, but making sounds

## 2020-06-30 NOTE — ED Notes (Signed)
Anesthesia  Here

## 2020-06-30 NOTE — Plan of Care (Signed)
PCCM called for vent mngt post IR stenting M1.

## 2020-06-30 NOTE — Anesthesia Procedure Notes (Signed)
Arterial Line Insertion Start/End1/27/2022 8:35 AM, 06/30/2020 8:39 AM Performed by: Darral Dash, DO, anesthesiologist  Patient location: OR. Preanesthetic checklist: patient identified, IV checked, site marked, risks and benefits discussed, surgical consent, monitors and equipment checked, pre-op evaluation, timeout performed and anesthesia consent Lidocaine 1% used for infiltration Left, brachial was placed Catheter size: 20 Fr Hand hygiene performed , maximum sterile barriers used  and Seldinger technique used  Attempts: 1 Procedure performed using ultrasound guided technique. Ultrasound Notes:anatomy identified, needle tip was noted to be adjacent to the nerve/plexus identified and no ultrasound evidence of intravascular and/or intraneural injection Following insertion, dressing applied, Biopatch and line sutured. Post procedure assessment: normal and unchanged  Patient tolerated the procedure well with no immediate complications.

## 2020-06-30 NOTE — Anesthesia Postprocedure Evaluation (Signed)
Anesthesia Post Note  Patient: Courtney Burke  Procedure(s) Performed: RADIOLOGY WITH ANESTHESIA (N/A )     Patient location during evaluation: PACU Anesthesia Type: General Level of consciousness: patient remains intubated per anesthesia plan Pain management: pain level controlled Vital Signs Assessment: post-procedure vital signs reviewed and stable Respiratory status: patient remains intubated per anesthesia plan Cardiovascular status: blood pressure returned to baseline and stable Postop Assessment: no apparent nausea or vomiting Anesthetic complications: no   No complications documented.  Last Vitals:  Vitals:   06/30/20 1223 06/30/20 1238  BP:  110/70  Pulse: 67 69  Resp: 16 16  Temp:    SpO2:  100%    Last Pain:  Vitals:   06/30/20 0806  TempSrc: Oral  PainSc: 0-No pain                 Lachina Salsberry A.

## 2020-06-30 NOTE — H&P (Addendum)
Neurology H&P  CC: CODE STROKE  History is obtained from: EMS, daughter  HPI: Courtney Burke is a 65 y.o. female with a PMHx of HTN, DM, CKD III, asthma, Vit D deficiency, obesity s/p gastric sleeve 12/21 who presents to [facility] via [facility] as a CODE STROKE. Per daughter, patient was last seen normal at bedtime 2100 hrs 06/29/20. Patient usually waits on her brother outside to take him to work and bring his car back home. When her brother pulled up, the patient was standing in her night gown and barefeet outside. Brother tried to call her over and she was just standing there staring off and frozen. When patient tried to speak it was "jibberish". Brother walked her back (she was able to ambulate) into the house and daughter called 911. Per daughter, once in the house, her eyes were rolling back in her head and she lost consciousness a couple of times for a few seconds. She was making moaning sounds and drooling. Her respirations were snorous. Daughter thinks she defecated on herself that morning. No personal hx of stroke. + stroke in father.   NP reviewed tPA inclusion/exclusion criteria with daughter and there was no contraindication noted. Explained to daughter our concern for stroke and/or seizure. However, due to being out of the window no tPA was given.   Upon arrival, patient's airway was cleared and she was taken emergently to CT. Patient unable to speak with left gaze preference and right sided hemiparesis.   CTA head and neck revealed left M1 occlusion. CT perfusion was delayed due to scanner issue. Imaging was reviewed by MD and discussed with radiology and IR and code IR was activated.   In review of chart, it appears patient has sought most of her care at [facility] and [place]. CKD III with baseline creat 1.32 2021. Per chart, she takes Norvasc and Hyzaar, Tradjenta, Metformin, ASA 81mg , Allopurinol, and Lipitor, recent A1c has been in the ~5.5% range    LKW: 2100 hrs 06/29/20 tpa given?: No, outside  window IR Thrombectomy? yes  MRS 1  NIHSS:  1a Level of Conscious: 0 1b LOC Questions: 2 1c LOC Commands: 2 2 Best Gaze: 1 3 Visual: 2 4 Facial Palsy: 2 5a Motor Arm - left: 1 5b Motor Arm - Right: 2 6a Motor Leg - Left: 0 6b Motor Leg - Right: 3 7 Limb Ataxia: 0 8 Sensory: 0 9 Best Language: 3 10 Dysarthria: 2 11 Extinct. and Inatten: 1 TOTAL: 21   ROS: Unable to assess due to altered mental status.   Past Medical History:  Diagnosis Date  . Asthma   . Borderline diabetes   . Hypertension   HLD, DM II, CKD III, gout, Vit D deficiency  FMHx: Stoke in father  Social History:  reports that she has never smoked. She has never used smokeless tobacco. She reports that she does not drink alcohol and does not use drugs.   Prior to Admission medications   Medication Sig Start Date End Date Taking? Authorizing Provider  albuterol (PROVENTIL HFA;VENTOLIN HFA) 108 (90 BASE) MCG/ACT inhaler Inhale 2 puffs into the lungs every 6 (six) hours as needed for wheezing.    [provider]  allopurinol (ZYLOPRIM) 100 MG tablet Take 100 mg by mouth daily.    [provider]  amLODipine (NORVASC) 10 MG tablet Take 10 mg by mouth daily.    [provider]  aspirin 81 MG tablet Take 81 mg by mouth daily.    [provider]  atorvastatin (LIPITOR) 40 MG tablet Take 40 mg by mouth daily.    [provider]  dicyclomine (BENTYL) 20 MG tablet Take 1 tablet (20 mg total) by mouth 2 (two) times daily as needed (Abdominal pain). 07/08/19   Ward, Ozella Almond, PA-C  linagliptin (TRADJENTA) 5 MG TABS tablet Take 5 mg by mouth daily.    [provider]  losartan-hydrochlorothiazide (HYZAAR) 100-12.5 MG per tablet Take 1 tablet by mouth daily.    [provider]  metFORMIN (GLUCOPHAGE) 500 MG tablet Take 500 mg by mouth 3 (three) times daily.    [provider]  oxybutynin (DITROPAN) 5 MG tablet Take 5 mg by mouth 3 (three) times  daily.    [provider]    Exam: Current vital signs: BP (!) 153/83 (BP Location: Right Arm)   Pulse 82   Temp (!) 97.3 F (36.3 C) (Oral)   Resp 18   LMP 09/28/2012   SpO2 100%   Physical Exam  Constitutional: Appears well-developed and well-nourished. Obese. Psych: affect appropriate to situation Eyes: No scleral injection HENT: No OP obstrucion Head: Normocephalic. AT Cardiovascular: RRR on tele Respiratory: Effort normal. SaO2 100% GI: Soft.  Skin: WDI. Well healed incisional scars to abdomen.   GCS: Best eye  4   Best verbal 1     Best motor  4--total  9  Neuro: Mental Status: Patient arouses briefly to name calling and tactile stimulation. No response to questions. Does not follow commands.  Patient is unable to give a clear and coherent history.  Speech/Language: Globally aphasic. Non verbal.   Cranial Nerves: II: Pupils are equal, round, and reactive to light. No blink to threat on the right but present on the left, suggesting right hemianopia  III,IV, VI: left sided gaze preference that intermittently could be overcome. No nystagmus.  V, VII: furls brow to noxious stimuli VIII: hearing is intact to voice IX, X: + cough and gag reflex.  XI: head is midline XII: Will not follow commands. Motor: Tone is flaccid on the right. Bulk is normal. Unable to lift RLE or RUE off bed.  Sensory: Equally reactive (grimace) to noxious stim in all four extremities   Reflexes: 3+ bilateral biceps Plantars: Toes are downgoing bilaterally. Cerebellar: No tremors, no clonus. Gait: deferred.   I have reviewed labs in epic and the pertinent results are:  INR  1.1   HbA1c 5.4% 2021 Cr 1.3 (1.11 on 07/04/19, 1.59 on 07/21/19)  Imaging reviewed by Dr. Curly Shores with radiology and IR.  CTA head and neck and perfusion showed- 1. Positive for emergent large vessel occlusion: Left MCA origin. Some left MCA branch reconstitution. 2. CT Perfusion underestimates infarct core  (ASPECTS 6), which is estimated at 11-22 mL, subsequent estimated penumbra of 60 to 67 mL. 3. No other large vessel occlusion. But intracranial atherosclerosis with: - Severe stenosis Right ACA origin. - moderate stenosis Right MCA M2 branch origin. - mild stenosis Right MCA M1, also Vertebrobasilar junction. 4. Cervical carotid atherosclerosis without stenosis.  Assessment: Courtney Burke is a 65 y.o. female PMHx with neurological significance of HTN, HLD, DM II, and obesity. Pt presented as a CODE STROKE and was found to have a left sided M1 occlusion. She was outside the window for tPA. After speaking with the daughter extensively about risks vs benefits (by Dr. Curly Shores and Dr. Dora Sims), daughter consented to IR thrombectomy. Code IR was activated. Patient intubated in IR suite and will be admitted to  ICU post procedure with IR orders per Dr. Dora Sims.     Impression:  1. Left MCA stroke with M1 occlusion  # Left M1 MCA stroke, atheroembolic - Stroke labs [id number], fasting lipid panel,  - Repeat HCT at 4 PM for stability scan - MRI brain 24 hours post stroke - CTA completed as above  - Frequent neuro checks - Echocardiogram - Prophylactic therapy-Antiplatelet med: Aspirin - dose 325mg  PO or 300mg  PR, followed by 81 mg daily -- start 24 hours post intervention if stable - Atorvastatin 80 mg nightly - Cangelor infusion per neurointerventional IR followed by ticagerlor  - Risk factor modification - Telemetry monitoring; 30 day event monitor on discharge if no arrythmias captured or consider loop recorder - Blood pressure goal              - Post successful uncomplicated revascularization SBP 120 - 140 for 24 hours; if complications have arisen or only partial revascularization reach out to interventionalist or neurologist on call for BP goal - PT consult, OT consult, Speech consult,  - Appreciate CCM team for vent management - Stroke team to follow  Pt seen by Clance Boll,  NP and MD. Note/plan to be edited by MD as necessary.  Pager: [phone]  Attending Neurologist's note:  I personally saw this patient, gathering history, performing a full neurologic examination, reviewing relevant labs, personally reviewing relevant imaging including dry head CT, CTA, CT perfusion, and formulated the assessment and plan, adding the note above for completeness and clarity to accurately reflect my thoughts  In brief, this is a 65 year old woman with limited vascular risk factors (hypertension, obesity, prediabetes), recently status post gastric bypass in the beginning of December, presenting with acute onset left MCA syndrome, with last known well at 9 PM on 1/26.  Aspect score is 6, but CT perfusion scan is favorable.  Notably it underestimates the core.  In discussion with radiology true core infarct is likely 11 to 20 cc, which still favors intervention.  Additionally the patient's exam is suggestive of a large area at risk.  Case was additionally discussed with Dr. Estanislado Pandy.  Risks and benefits were reviewed with the patient's daughter and decision was made to proceed with IR thrombectomy.  80 minutes were spent in the critical care time of this patient today independent of time spent by NP  Addendum:  TICI 2C reperfusion, severe MCA intracranial stenosis found on angio. Plan above updated accordingly.

## 2020-06-30 NOTE — Consult Note (Signed)
NAME:  Courtney Burke, MRN:  [id number], DOB:  1955-11-29, LOS: 0 ADMISSION DATE:  06/30/2020, CONSULTATION DATE:  06/30/20 REFERRING MD:  Dr. Curly Shores, CHIEF COMPLAINT:  Intubated   Brief History:  65 year old female with a history of hypertension, diabetes, CKD stage III, asthma, vitamin D deficiency, obesity status post gastric sleeve December 2021 presented as code stroke.  Found to have left M1 occlusion and taken for IR thrombectomy.  Status post revascularization and stent placement.  PCCM consulted for vent management.  History of Present Illness:  This is a 65 year old female with a history of hypertension, diabetes, CKD stage III, asthma, vitamin D deficiency, obesity status post gastric sleeve December 2021 presented as code stroke.  Patient is currently intubated and sedated, history obtained by chart review.  Last known normal was last night around 2100, patient was found by her brother, she is standing in her nightgown in bare feet outside staring off into space, was speaking gibberish.  Brother walked patient back into the house and her daughter called 911.  Patient was noted to have her eyes rolling back into her head, had loss of consciousness for a few seconds, and was making moaning sounds and drooling.  A code stroke was called.  On arrival patient was unable to speak and had left gaze preference and right-sided hemiparesis. CT head showed acute left MCA territory infarct, aspect 9, no hemorrhage or mass-effect noted, small chronic appearing right cerebral infarct. Patient was out of window for TPA.  CTA head and neck showed emergent large vessel occlusion of left MCA.  IR performed left common carotid arteriogram with revascularization of occluded left MCA proximal and placement of 2.25x12 mm Onyx resolute balloon mountable stent with TICI 2C revascularization.  Patient was left intubated to protect airway and transferred to 4N.   Past Medical History:  Hypertension, diabetes, CKD stage  III, asthma, vitamin D deficiency, obesity  Significant Hospital Events:  1/27: IR thrombectomy and stent placement, intubated  Consults:  PCCM Neurology Neurosurgery  Procedures:  IR thrombectomy 1/27 ETT 1/27 >  Significant Diagnostic Tests:  CT head: IMPRESSION: 1. Acute Left MCA territory infarct. ASPECTS 6. No hemorrhage or mass effect at this time. 2. The above discussed by telephone with Dr. Curly Shores on 06/30/2020 at 07:32 . 3. 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  CTA head and neck: IMPRESSION: 1. Positive for emergent large vessel occlusion: Left MCA origin. Some left MCA branch reconstitution.  2. CT Perfusion underestimates infarct core (ASPECTS 6), which is estimated at 11-22 mL, subsequent estimated penumbra of 60 to 67 mL.  3. No other large vessel occlusion. But intracranial atherosclerosis with: - Severe stenosis Right ACA origin. - moderate stenosis Right MCA M2 branch origin. - mild stenosis Right MCA M1, also Vertebrobasilar junction.  4. Cervical carotid atherosclerosis without stenosis.  Micro Data:    Antimicrobials:  Cefazolin 1/27 preprocedure   Interim History / Subjective:  Patient arrived from OR, currently intubated and sedated.  Vitals stable.   Objective   Blood pressure 115/85, pulse 78, temperature (!) 97.3 F (36.3 C), temperature source Oral, resp. rate 20, height 5\' 2"  (1.575 m), last menstrual period 09/28/2012, SpO2 100 %.    Vent Mode: PRVC FiO2 (%):  [100 %] 100 % Set Rate:  [16 bmp] 16 bmp PEEP:  [5 cmH20] 5 cmH20 Plateau Pressure:  [24 cmH20] 24 cmH20   Intake/Output Summary (Last 24 hours) at 06/30/2020 1420 Last data filed at 06/30/2020 1110  Gross per 24 hour  Intake 1300 ml  Output --  Net 1300 ml   There were no vitals filed for this visit.  Examination: General: Critically ill-appearing middle-aged female, intubated and sedated HENT: ETT in place, atraumatic and  normocephalic Lungs: Coarse breath sounds, normal work of breathing, full ventilator support Cardiovascular: Regular rate and rhythm, no murmurs rubs or gallops Abdomen: Soft, nontender, nondistended, active bowel sounds Extremities: Trace bilateral lower extremity swelling, pulses 2+ and equal, right femoral site bandaged Neuro: Sedated, moving left arm, PERRLA  Resolved Hospital Problem list     Assessment & Plan:   Acute hypoxic respiratory failure requiring intubation: Intubated for IR thrombectomy and left intubated to protect airway.  Patient does have a history of asthma, is on albuterol as needed at home. Respiratory exam is reassuring. Currently on full pressure support with PEEP 5, FiO2 100%, and rate of 16.  Chest x-ray obtained that showed ET tube tip at carina, no acute pulmonary edema or infiltrate. Order to withdraw by 2 cm placed.   -Obtain ABG, adjust vent settings as indicated -VAP protocol -Continue propofol drip -Continue fentanyl drip -Full ventilator support -Daily SBT -Daily wake up assessment  Left MCA LVO s/p left MCA thrombectomy and stent placement: Presented with decreased responsiveness, loss of consciousness, left gaze preference and right-sided hemiparesis.  Found to have large vessel occlusion of the MCA territory.  Status post thrombectomy and stent placement in left MCA proximal.  Post op CT showed no evidence of intracranial hemorrhage.  Of note it appears that patient has a history of CVA with small chronic appearing right cerebellar infarct on CT.  Patient has multiple risk factors for acute CVA including diabetes, hypertension, and hyperlipidemia.  Neurology on board.  -Continue aspirin and Plavix -Blood pressure goal 120-140 -ASA 81 mg daily  -Lipitor 80 mg daily -S/p cangrelor, and brilinta per neurosurgery -Echocardiogram  -A1C  -Lipid panel  -Tele monitoring  -MRA and MRI brain -PT/OT/SLP  Hypertension Patient is on Norvasc and Hyzaar  at home.  Blood pressures have been around 115-134/79-92.  Goal blood pressure between 120-140 following thrombectomy and stent placement.  -Hold home blood pressure medications -Cleviprex drip PRN  Hyperlipidemia -Continue Lipitor  CKD stage III Cr 1.2, last Cr noted in chart was 1.59. Will monitor kidney function for now.  -BMP in AM  Diabetes Checking A1c as above.  -Frequent CBGs -SSI  Obesity  Best practice (evaluated daily)  Diet: NPO Pain/Anxiety/Delirium protocol (if indicated): Fentanyl, propofol VAP protocol (if indicated): Yes DVT prophylaxis: SCDs GI prophylaxis: PPI Glucose control: SSI Mobility: PTOT Disposition:Neuro ICU  Goals of Care:  Last date of multidisciplinary goals of care discussion: Family and staff present:  Summary of discussion:  Follow up goals of care discussion due:  Code Status: Full  Labs   CBC: Recent Labs  Lab 06/30/20 0710 06/30/20 0719 06/30/20 1405  WBC 6.1  --   --   NEUTROABS 3.6  --   --   HGB 11.5* 11.2* 9.9*  HCT 36.3 33.0* 29.0*  MCV 88.5  --   --   PLT 507*  --   --     Basic Metabolic Panel: Recent Labs  Lab 06/30/20 0710 06/30/20 0719 06/30/20 1405  NA 140 142 142  K 3.5 3.6 3.4*  CL 111 111  --   CO2 20*  --   --   GLUCOSE 140* 135*  --   BUN 14 17  --   CREATININE 1.41*  1.30*  --   CALCIUM 9.0  --   --    GFR: CrCl cannot be calculated (Unknown ideal weight.). Recent Labs  Lab 06/30/20 0710  WBC 6.1    Liver Function Tests: Recent Labs  Lab 06/30/20 0710  AST 21  ALT 15  ALKPHOS 101  BILITOT 0.5  PROT 6.4*  ALBUMIN 3.4*   No results for input(s): LIPASE, AMYLASE in the last 168 hours. No results for input(s): AMMONIA in the last 168 hours.  ABG    Component Value Date/Time   PHART 7.275 (L) 06/30/2020 1405   PCO2ART 33.7 06/30/2020 1405   PO2ART 377 (H) 06/30/2020 1405   HCO3 15.7 (L) 06/30/2020 1405   TCO2 17 (L) 06/30/2020 1405   ACIDBASEDEF 10.0 (H) 06/30/2020 1405    O2SAT 100.0 06/30/2020 1405     Coagulation Profile: Recent Labs  Lab 06/30/20 0710  INR 1.1    Cardiac Enzymes: No results for input(s): CKTOTAL, CKMB, CKMBINDEX, TROPONINI in the last 168 hours.  HbA1C: No results found for: HGBA1C  CBG: Recent Labs  Lab 06/30/20 0713 06/30/20 1151  GLUCAP 134* 135*    Review of Systems:   Per HPI.   Past Medical History:  She,  has a past medical history of Asthma, Borderline diabetes, and Hypertension.   Surgical History:   Past Surgical History:  Procedure Laterality Date  . ANKLE SURGERY    . CARPAL TUNNEL RELEASE    . carpel tunnel       Social History:   reports that she has never smoked. She has never used smokeless tobacco. She reports that she does not drink alcohol and does not use drugs.   Family History:  Her family history is not on file.   Allergies Allergies  Allergen Reactions  . Codeine      Home Medications  Prior to Admission medications   Medication Sig Start Date End Date Taking? Authorizing Provider  albuterol (PROVENTIL HFA;VENTOLIN HFA) 108 (90 BASE) MCG/ACT inhaler Inhale 2 puffs into the lungs every 6 (six) hours as needed for wheezing.    [provider]  allopurinol (ZYLOPRIM) 100 MG tablet Take 100 mg by mouth daily.    [provider]  amLODipine (NORVASC) 10 MG tablet Take 10 mg by mouth daily.    [provider]  aspirin 81 MG tablet Take 81 mg by mouth daily.    [provider]  atorvastatin (LIPITOR) 40 MG tablet Take 40 mg by mouth daily.    [provider]  dicyclomine (BENTYL) 20 MG tablet Take 1 tablet (20 mg total) by mouth 2 (two) times daily as needed (Abdominal pain). 07/08/19   Ward, Ozella Almond, PA-C  linagliptin (TRADJENTA) 5 MG TABS tablet Take 5 mg by mouth daily.    [provider]  losartan-hydrochlorothiazide (HYZAAR) 100-12.5 MG per tablet Take 1 tablet by mouth daily.    [provider]  metFORMIN  (GLUCOPHAGE) 500 MG tablet Take 500 mg by mouth 3 (three) times daily.    [provider]  oxybutynin (DITROPAN) 5 MG tablet Take 5 mg by mouth 3 (three) times daily.    [provider]     Critical care time: 35 minutes     Asencion Noble, M.D. PGY3 06/30/2020 2:20 PM

## 2020-06-30 NOTE — Anesthesia Preprocedure Evaluation (Addendum)
Anesthesia Evaluation  Patient identified by MRN, date of birth, ID band Patient awake    Reviewed: Allergy & Precautions, Patient's Chart, lab work & pertinent test resultsPreop documentation limited or incomplete due to emergent nature of procedure.  Airway Mallampati: II  TM Distance: >3 FB Neck ROM: Full    Dental  (+) Teeth Intact   Pulmonary asthma ,    Pulmonary exam normal        Cardiovascular hypertension, Pt. on medications  Rhythm:Regular Rate:Normal     Neuro/Psych CVA    GI/Hepatic negative GI ROS, Neg liver ROS,   Endo/Other  negative endocrine ROS  Renal/GU negative Renal ROS     Musculoskeletal Gout   Abdominal (+)  Abdomen: soft. Bowel sounds: normal.  Peds  Hematology  (+) anemia , HLD   Anesthesia Other Findings code stroke  Reproductive/Obstetrics                            Anesthesia Physical Anesthesia Plan  ASA: III and emergent  Anesthesia Plan: General   Post-op Pain Management:    Induction: Intravenous and Rapid sequence  PONV Risk Score and Plan: 3 and Ondansetron, Dexamethasone and Treatment may vary due to age or medical condition  Airway Management Planned: Oral ETT  Additional Equipment: Arterial line  Intra-op Plan:   Post-operative Plan: Possible Post-op intubation/ventilation  Informed Consent: I have reviewed the patients History and Physical, chart, labs and discussed the procedure including the risks, benefits and alternatives for the proposed anesthesia with the patient or authorized representative who has indicated his/her understanding and acceptance.     Dental advisory given  Plan Discussed with: Anesthesiologist  Anesthesia Plan Comments: (Lab Results      Component                Value               Date                      WBC                      6.1                 06/30/2020                HGB                      11.2  (L)            06/30/2020                HCT                      33.0 (L)            06/30/2020                MCV                      88.5                06/30/2020                PLT                      507 (H)  06/30/2020           Lab Results      Component                Value               Date                      NA                       142                 06/30/2020                K                        3.6                 06/30/2020                CO2                      20 (L)              06/30/2020                GLUCOSE                  135 (H)             06/30/2020                BUN                      17                  06/30/2020                CREATININE               1.30 (H)            06/30/2020                CALCIUM                  9.0                 06/30/2020                GFRNONAA                 41 (L)              06/30/2020                GFRAA                    39 (L)              07/08/2019          )       Anesthesia Quick Evaluation

## 2020-06-30 NOTE — Progress Notes (Signed)
Pt transported to CT and back with no complications noted.

## 2020-06-30 NOTE — Progress Notes (Signed)
PT Cancellation Note  Patient Details Name: Courtney Burke MRN: [id number] DOB: 1956/03/08   Cancelled Treatment:    Reason Eval/Treat Not Completed: Active bedrest order - will check back when medically appropriate.   Stacie Glaze, PT Acute Rehabilitation Services Pager [phone]  Office [phone]    Louis Matte 06/30/2020, 2:02 PM

## 2020-06-30 NOTE — ED Notes (Signed)
Consent for IR for occlusion obtained by Dr. Patrecia Pour by phone from daughter Lillyann Ahart.

## 2020-06-30 NOTE — Progress Notes (Signed)
PH 7.27 PCO233.7 PO2 377 HCO3 15.7  Results given to Dr Elsworth Soho

## 2020-06-30 NOTE — Anesthesia Procedure Notes (Signed)
Procedure Name: Intubation Date/Time: 06/30/2020 8:09 AM Performed by: Renato Shin, CRNA Pre-anesthesia Checklist: Patient identified, Emergency Drugs available, Suction available and Patient being monitored Patient Re-evaluated:Patient Re-evaluated prior to induction Oxygen Delivery Method: Circle system utilized Preoxygenation: Pre-oxygenation with 100% oxygen Induction Type: IV induction and Rapid sequence Laryngoscope Size: Glidescope and 3 Grade View: Grade I Tube type: Oral Tube size: 7.5 mm Number of attempts: 1 Airway Equipment and Method: Stylet and Oral airway Placement Confirmation: ETT inserted through vocal cords under direct vision,  positive ETCO2 and breath sounds checked- equal and bilateral Secured at: 21 cm Tube secured with: Tape Dental Injury: Teeth and Oropharynx as per pre-operative assessment

## 2020-06-30 NOTE — ED Provider Notes (Signed)
[facility] Provider Note  CSN: [id number] Arrival date & time: 06/30/20 4098    History Chief Complaint  Patient presents with  . Code Stroke    HPI  Courtney Burke is a 65 y.o. female with history of HTN brought to the ED via EMS as a code stroke. She was last seen normal around 9pm last night. Around 6am today family found her standing outside in the cold wearing just a thin night shirt not responding to them. They assisted her back to the house where she was noted to be weak on the right side. EMS reports gaze deviation and right sided weakness. No history of seizures. Patient unable to give any additional history. Level 5 caveat applies.    Past Medical History:  Diagnosis Date  . Asthma   . Borderline diabetes   . Hypertension     Past Surgical History:  Procedure Laterality Date  . ANKLE SURGERY    . CARPAL TUNNEL RELEASE    . carpel tunnel      No family history on file.  Social History   Tobacco Use  . Smoking status: Never Smoker  . Smokeless tobacco: Never Used  Vaping Use  . Vaping Use: Never used  Substance Use Topics  . Alcohol use: No  . Drug use: No     Home Medications Prior to Admission medications   Medication Sig Start Date End Date Taking? Authorizing Provider  albuterol (PROVENTIL HFA;VENTOLIN HFA) 108 (90 BASE) MCG/ACT inhaler Inhale 2 puffs into the lungs every 6 (six) hours as needed for wheezing.    [provider]  allopurinol (ZYLOPRIM) 100 MG tablet Take 100 mg by mouth daily.    [provider]  amLODipine (NORVASC) 10 MG tablet Take 10 mg by mouth daily.    [provider]  aspirin 81 MG tablet Take 81 mg by mouth daily.    [provider]  atorvastatin (LIPITOR) 40 MG tablet Take 40 mg by mouth daily.    [provider]  dicyclomine (BENTYL) 20 MG tablet Take 1 tablet (20 mg total) by mouth 2 (two) times daily as needed (Abdominal pain). 07/08/19   Ward, Ozella Almond, PA-C  linagliptin (TRADJENTA) 5 MG TABS tablet Take 5 mg by mouth daily.    [provider]  losartan-hydrochlorothiazide (HYZAAR) 100-12.5 MG per tablet Take 1 tablet by mouth daily.    [provider]  metFORMIN (GLUCOPHAGE) 500 MG tablet Take 500 mg by mouth 3 (three) times daily.    [provider]  oxybutynin (DITROPAN) 5 MG tablet Take 5 mg by mouth 3 (three) times daily.    [provider]     Allergies    Codeine   Review of Systems   Review of Systems Unable to assess due to mental status.    Physical Exam BP (!) 153/83 (BP Location: Right Arm)   Pulse 82   Temp (!) 97.3 F (36.3 C) (Oral)   Resp 18   LMP 09/28/2012   SpO2 100%   Physical Exam Vitals and nursing note reviewed.  Constitutional:      Appearance: Normal appearance.  HENT:     Head: Normocephalic and atraumatic.     Nose: Nose normal.     Mouth/Throat:     Mouth: Mucous membranes are moist.  Eyes:     Extraocular Movements: Extraocular movements intact.     Conjunctiva/sclera: Conjunctivae normal.  Cardiovascular:     Rate and Rhythm:  Normal rate.  Pulmonary:     Effort: Pulmonary effort is normal.     Breath sounds: Normal breath sounds.  Abdominal:     General: Abdomen is flat.     Palpations: Abdomen is soft.     Tenderness: There is no abdominal tenderness.  Musculoskeletal:        General: No swelling. Normal range of motion.     Cervical back: Neck supple.  Skin:    General: Skin is warm and dry.  Neurological:     Comments: L gaze deviation initially, improved with drawing attention to right side. Globally weak; no facial droop. For full NIHSS, please see neurology evaluation.   Psychiatric:        Mood and Affect: Mood normal.      ED Results / Procedures / Treatments   Labs (all labs ordered are listed, but only abnormal results are displayed) Labs Reviewed  CBC - Abnormal; Notable for the following components:      Result  Value   Hemoglobin 11.5 (*)    Platelets 507 (*)    All other components within normal limits  COMPREHENSIVE METABOLIC PANEL - Abnormal; Notable for the following components:   CO2 20 (*)    Glucose, Bld 140 (*)    Creatinine, Ser 1.41 (*)    Total Protein 6.4 (*)    Albumin 3.4 (*)    GFR, Estimated 41 (*)    All other components within normal limits  CBG MONITORING, ED - Abnormal; Notable for the following components:   Glucose-Capillary 134 (*)    All other components within normal limits  I-STAT CHEM 8, ED - Abnormal; Notable for the following components:   Creatinine, Ser 1.30 (*)    Glucose, Bld 135 (*)    Hemoglobin 11.2 (*)    HCT 33.0 (*)    All other components within normal limits  SARS CORONAVIRUS 2 BY RT PCR (HOSPITAL ORDER, [facility])  PROTIME-INR  APTT  DIFFERENTIAL  CBG MONITORING, ED    EKG None  Radiology CT Code Stroke CTA Head W/WO contrast  Result Date: 06/30/2020 CLINICAL DATA:  65 year old female code stroke presentation with left MCA ASPECTS 6. Left gaze deviation. EXAM: CT ANGIOGRAPHY HEAD AND NECK CT PERFUSION BRAIN TECHNIQUE: Multidetector CT imaging of the head and neck was performed using the standard protocol during bolus administration of intravenous contrast. Multiplanar CT image reconstructions and MIPs were obtained to evaluate the vascular anatomy. Carotid stenosis measurements (when applicable) are obtained utilizing NASCET criteria, using the distal internal carotid diameter as the denominator. Multiphase CT imaging of the brain was performed following IV bolus contrast injection. Subsequent parametric perfusion maps were calculated using RAPID software. CONTRAST:  144mL OMNIPAQUE IOHEXOL 350 MG/ML SOLN COMPARISON:  Plain head CT today 0721 hours. [facility] brain MRI 02/15/2016 FINDINGS: CT Brain Perfusion Findings: ASPECTS: 6 CBF (<30%) Volume: 65mL (erroneous). Using CBF less  than 38% 11 mL of parenchyma is detected which does partially correspond to the area of cytotoxic edema by plain CT. Perfusion (Tmax>6.0s) volume: 80mL, hypoperfusion index 0.1 but also might be spurrious. Mismatch Volume: Calculated is not accurate due to erroneously low infarct core, estimated penumbra given the above is 60 to 67 mL. Infarction Location:Left MCA The above was discussed by telephone with Dr. Lesleigh Noe on 06/30/2020 at 07:49 . CTA NECK Skeleton: No acute osseous abnormality identified. Dystrophic/degenerative calcifications of the longus coli muscle insertion at  C1-C2. Upper chest: Negative. Other neck: No acute finding. Incidental contrast reflux into a left posterior neck vein. Aortic arch: Slightly bovine arch configuration. No arch atherosclerosis. Right carotid system: Mildly tortuous proximal right CCA. Moderate calcified plaque at the right ICA origin with less than 50 % stenosis with respect to the distal vessel. Mild tortuosity. Left carotid system: Patent, mildly tortuous left CCA. Mild to moderate calcified plaque at the posterior left ICA origin with less than 50 % stenosis with respect to the distal vessel. Mildly tortuous. Vertebral arteries: Negative proximal right subclavian artery and right vertebral artery origin. The right vertebral is mildly tortuous but patent to the skull base without stenosis. Moderate soft more than calcified plaque in the proximal left subclavian artery although no significant stenosis. Left vertebral artery origin remains normal. Codominant left vertebral artery is patent to the skull base without stenosis. CTA HEAD Posterior circulation: Mild right V4 calcified plaque. Patent distal vertebral arteries to the basilar with mild stenosis at the vertebrobasilar junction. Diminutive PICA. Left AICA appears dominant. Patent basilar artery is diminutive but without stenosis. Patent SCA origins with fetal type bilateral PCA origins. Patent basilar tip.  Bilateral PCA branches are within normal limits. Anterior circulation: Both ICA siphons are patent. No significant siphon plaque or stenosis. Normal posterior communicating artery origins. Patent carotid termini although the left MCA origin is occluded (series 10, image 19). There is some left MCA branch reconstitution as seen on series 13, image 43. Left ACA origin is normal. There is severe stenosis at the right ACA origin. Anterior communicating artery is diminutive. Bilateral A2 branches appear symmetric and within normal limits. Right MCA origin is patent but also irregular with mild stenosis. Right MCA M1 and right MCA bifurcation are patent, although with moderate stenosis at the posterior right MCA M2 origin (series 12, image 10). No right MCA branch occlusion identified. Venous sinuses: Early contrast timing, grossly patent. Anatomic variants: Fetal type PCA origins. Review of the MIP images confirms the above findings IMPRESSION: 1. Positive for emergent large vessel occlusion: Left MCA origin. Some left MCA branch reconstitution. 2. CT Perfusion underestimates infarct core (ASPECTS 6), which is estimated at 11-22 mL, subsequent estimated penumbra of 60 to 67 mL. 3. No other large vessel occlusion. But intracranial atherosclerosis with: - Severe stenosis Right ACA origin. - moderate stenosis Right MCA M2 branch origin. - mild stenosis Right MCA M1, also Vertebrobasilar junction. 4. Cervical carotid atherosclerosis without stenosis. #1 discussed by telephone with Dr. Lesleigh Noe on 06/30/2020 at 0732 hours. And [facility] CTP findings discussed at 0749 hours. Electronically Signed   By: Genevie Ann M.D.   On: 06/30/2020 07:58   CT Code Stroke CTA Neck W/WO contrast  Result Date: 06/30/2020 CLINICAL DATA:  65 year old female code stroke presentation with left MCA ASPECTS 6. Left gaze deviation. EXAM: CT ANGIOGRAPHY HEAD AND NECK CT PERFUSION BRAIN TECHNIQUE: Multidetector CT imaging of the head and neck was  performed using the standard protocol during bolus administration of intravenous contrast. Multiplanar CT image reconstructions and MIPs were obtained to evaluate the vascular anatomy. Carotid stenosis measurements (when applicable) are obtained utilizing NASCET criteria, using the distal internal carotid diameter as the denominator. Multiphase CT imaging of the brain was performed following IV bolus contrast injection. Subsequent parametric perfusion maps were calculated using RAPID software. CONTRAST:  159mL OMNIPAQUE IOHEXOL 350 MG/ML SOLN COMPARISON:  Plain head CT today 0721 hours. [facility] brain MRI 02/15/2016 FINDINGS: CT Brain  Perfusion Findings: ASPECTS: 6 CBF (<30%) Volume: 12mL (erroneous). Using CBF less than 38% 11 mL of parenchyma is detected which does partially correspond to the area of cytotoxic edema by plain CT. Perfusion (Tmax>6.0s) volume: 70mL, hypoperfusion index 0.1 but also might be spurrious. Mismatch Volume: Calculated is not accurate due to erroneously low infarct core, estimated penumbra given the above is 60 to 67 mL. Infarction Location:Left MCA The above was discussed by telephone with Dr. Lesleigh Noe on 06/30/2020 at 07:49 . CTA NECK Skeleton: No acute osseous abnormality identified. Dystrophic/degenerative calcifications of the longus coli muscle insertion at C1-C2. Upper chest: Negative. Other neck: No acute finding. Incidental contrast reflux into a left posterior neck vein. Aortic arch: Slightly bovine arch configuration. No arch atherosclerosis. Right carotid system: Mildly tortuous proximal right CCA. Moderate calcified plaque at the right ICA origin with less than 50 % stenosis with respect to the distal vessel. Mild tortuosity. Left carotid system: Patent, mildly tortuous left CCA. Mild to moderate calcified plaque at the posterior left ICA origin with less than 50 % stenosis with respect to the distal vessel. Mildly tortuous.  Vertebral arteries: Negative proximal right subclavian artery and right vertebral artery origin. The right vertebral is mildly tortuous but patent to the skull base without stenosis. Moderate soft more than calcified plaque in the proximal left subclavian artery although no significant stenosis. Left vertebral artery origin remains normal. Codominant left vertebral artery is patent to the skull base without stenosis. CTA HEAD Posterior circulation: Mild right V4 calcified plaque. Patent distal vertebral arteries to the basilar with mild stenosis at the vertebrobasilar junction. Diminutive PICA. Left AICA appears dominant. Patent basilar artery is diminutive but without stenosis. Patent SCA origins with fetal type bilateral PCA origins. Patent basilar tip. Bilateral PCA branches are within normal limits. Anterior circulation: Both ICA siphons are patent. No significant siphon plaque or stenosis. Normal posterior communicating artery origins. Patent carotid termini although the left MCA origin is occluded (series 10, image 19). There is some left MCA branch reconstitution as seen on series 13, image 43. Left ACA origin is normal. There is severe stenosis at the right ACA origin. Anterior communicating artery is diminutive. Bilateral A2 branches appear symmetric and within normal limits. Right MCA origin is patent but also irregular with mild stenosis. Right MCA M1 and right MCA bifurcation are patent, although with moderate stenosis at the posterior right MCA M2 origin (series 12, image 10). No right MCA branch occlusion identified. Venous sinuses: Early contrast timing, grossly patent. Anatomic variants: Fetal type PCA origins. Review of the MIP images confirms the above findings IMPRESSION: 1. Positive for emergent large vessel occlusion: Left MCA origin. Some left MCA branch reconstitution. 2. CT Perfusion underestimates infarct core (ASPECTS 6), which is estimated at 11-22 mL, subsequent estimated penumbra of 60  to 67 mL. 3. No other large vessel occlusion. But intracranial atherosclerosis with: - Severe stenosis Right ACA origin. - moderate stenosis Right MCA M2 branch origin. - mild stenosis Right MCA M1, also Vertebrobasilar junction. 4. Cervical carotid atherosclerosis without stenosis. #1 discussed by telephone with Dr. Lesleigh Noe on 06/30/2020 at 0732 hours. And [facility] CTP findings discussed at 0749 hours. Electronically Signed   By: Genevie Ann M.D.   On: 06/30/2020 07:58   CT Code Stroke Cerebral Perfusion with contrast  Result Date: 06/30/2020 CLINICAL DATA:  65 year old female code stroke presentation with left MCA ASPECTS 6. Left gaze deviation. EXAM: CT ANGIOGRAPHY HEAD AND NECK CT PERFUSION BRAIN TECHNIQUE: Multidetector CT  imaging of the head and neck was performed using the standard protocol during bolus administration of intravenous contrast. Multiplanar CT image reconstructions and MIPs were obtained to evaluate the vascular anatomy. Carotid stenosis measurements (when applicable) are obtained utilizing NASCET criteria, using the distal internal carotid diameter as the denominator. Multiphase CT imaging of the brain was performed following IV bolus contrast injection. Subsequent parametric perfusion maps were calculated using RAPID software. CONTRAST:  167mL OMNIPAQUE IOHEXOL 350 MG/ML SOLN COMPARISON:  Plain head CT today 0721 hours. [facility] brain MRI 02/15/2016 FINDINGS: CT Brain Perfusion Findings: ASPECTS: 6 CBF (<30%) Volume: 28mL (erroneous). Using CBF less than 38% 11 mL of parenchyma is detected which does partially correspond to the area of cytotoxic edema by plain CT. Perfusion (Tmax>6.0s) volume: 42mL, hypoperfusion index 0.1 but also might be spurrious. Mismatch Volume: Calculated is not accurate due to erroneously low infarct core, estimated penumbra given the above is 60 to 67 mL. Infarction Location:Left MCA The above was discussed by telephone  with Dr. Lesleigh Noe on 06/30/2020 at 07:49 . CTA NECK Skeleton: No acute osseous abnormality identified. Dystrophic/degenerative calcifications of the longus coli muscle insertion at C1-C2. Upper chest: Negative. Other neck: No acute finding. Incidental contrast reflux into a left posterior neck vein. Aortic arch: Slightly bovine arch configuration. No arch atherosclerosis. Right carotid system: Mildly tortuous proximal right CCA. Moderate calcified plaque at the right ICA origin with less than 50 % stenosis with respect to the distal vessel. Mild tortuosity. Left carotid system: Patent, mildly tortuous left CCA. Mild to moderate calcified plaque at the posterior left ICA origin with less than 50 % stenosis with respect to the distal vessel. Mildly tortuous. Vertebral arteries: Negative proximal right subclavian artery and right vertebral artery origin. The right vertebral is mildly tortuous but patent to the skull base without stenosis. Moderate soft more than calcified plaque in the proximal left subclavian artery although no significant stenosis. Left vertebral artery origin remains normal. Codominant left vertebral artery is patent to the skull base without stenosis. CTA HEAD Posterior circulation: Mild right V4 calcified plaque. Patent distal vertebral arteries to the basilar with mild stenosis at the vertebrobasilar junction. Diminutive PICA. Left AICA appears dominant. Patent basilar artery is diminutive but without stenosis. Patent SCA origins with fetal type bilateral PCA origins. Patent basilar tip. Bilateral PCA branches are within normal limits. Anterior circulation: Both ICA siphons are patent. No significant siphon plaque or stenosis. Normal posterior communicating artery origins. Patent carotid termini although the left MCA origin is occluded (series 10, image 19). There is some left MCA branch reconstitution as seen on series 13, image 43. Left ACA origin is normal. There is severe stenosis at the  right ACA origin. Anterior communicating artery is diminutive. Bilateral A2 branches appear symmetric and within normal limits. Right MCA origin is patent but also irregular with mild stenosis. Right MCA M1 and right MCA bifurcation are patent, although with moderate stenosis at the posterior right MCA M2 origin (series 12, image 10). No right MCA branch occlusion identified. Venous sinuses: Early contrast timing, grossly patent. Anatomic variants: Fetal type PCA origins. Review of the MIP images confirms the above findings IMPRESSION: 1. Positive for emergent large vessel occlusion: Left MCA origin. Some left MCA branch reconstitution. 2. CT Perfusion underestimates infarct core (ASPECTS 6), which is estimated at 11-22 mL, subsequent estimated penumbra of 60 to 67 mL. 3. No other large vessel occlusion. But intracranial atherosclerosis with: - Severe stenosis  Right ACA origin. - moderate stenosis Right MCA M2 branch origin. - mild stenosis Right MCA M1, also Vertebrobasilar junction. 4. Cervical carotid atherosclerosis without stenosis. #1 discussed by telephone with Dr. Lesleigh Noe on 06/30/2020 at 0732 hours. And [facility] CTP findings discussed at 0749 hours. Electronically Signed   By: Genevie Ann M.D.   On: 06/30/2020 07:58   CT HEAD CODE STROKE WO CONTRAST  Result Date: 06/30/2020 CLINICAL DATA:  Code stroke. 65 year old female with right side weakness, nonverbal. EXAM: CT HEAD WITHOUT CONTRAST TECHNIQUE: Contiguous axial images were obtained from the base of the skull through the vertex without intravenous contrast. COMPARISON:  [facility] brain MRI 02/15/2016 FINDINGS: Brain: No ventriculomegaly. No midline shift, mass effect, or evidence of intracranial mass lesion. No acute intracranial hemorrhage identified. Asymmetric left MCA territory indistinct gray and white matter hypodensity compatible with cytotoxic edema (series 2, image 15, 13). Left insula, left M2 and  M3 segments affected. Left lentiform also asymmetric on series 2, image 16. Contralateral right hemisphere and posterior fossa gray-white matter differentiation is preserved. There is a small chronic appearing right cerebellar infarct which is new from 2017 (series 2, image 10). No acute intracranial hemorrhage identified. Vascular: Mild Calcified atherosclerosis at the skull base. No suspicious intracranial vascular hyperdensity. Skull: Negative. Sinuses/Orbits: 6 Visualized paranasal sinuses and mastoids are clear. Other: Leftward gaze deviation. Visualized scalp soft tissues are within normal limits. ASPECTS [facility] Stroke Program Early CT Score) - Ganglionic level infarction (caudate, lentiform nuclei, internal capsule, insula, M1-M3 cortex): 3 - Supraganglionic infarction (M4-M6 cortex): 3 Total score (0-10 with 10 being normal): 6 IMPRESSION: 1. Acute Left MCA territory infarct. ASPECTS 6. No hemorrhage or mass effect at this time. 2. The above discussed by telephone with Dr. Curly Shores on 06/30/2020 at 07:32 . 3. 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 Electronically Signed   By: Genevie Ann M.D.   On: 06/30/2020 07:34    Procedures .Critical Care Performed by: Truddie Hidden, MD Authorized by: Truddie Hidden, MD   Critical care provider statement:    Critical care time (minutes):  35   Critical care was necessary to treat or prevent imminent or life-threatening deterioration of the following conditions:  CNS failure or compromise   Critical care was time spent personally by me on the following activities:  Discussions with consultants, evaluation of patient's response to treatment, examination of patient, ordering and performing treatments and interventions, ordering and review of laboratory studies, ordering and review of radiographic studies, pulse oximetry, re-evaluation of patient's condition, obtaining history from patient or surrogate and review of old charts    Care discussed with: admitting provider      Medications Ordered in the ED Medications  sodium chloride flush (NS) 0.9 % injection 3 mL (has no administration in time range)  ceFAZolin (ANCEF) 2-4 GM/100ML-% IVPB (has no administration in time range)  nitroGLYCERIN 100 mcg/mL intra-arterial injection (has no administration in time range)  iohexol (OMNIPAQUE) 350 MG/ML injection 100 mL (100 mLs Intravenous Contrast Given 06/30/20 0741)     MDM Rules/Calculators/A&P MDM Neurology at bedside on arrival. Patient taken emergently to CT. Code Stroke order set initiated.   ED Course  I have reviewed the triage vital signs and the nursing notes.  Pertinent labs & imaging results that were available during my care of the patient were reviewed by me and considered in my medical decision making (see chart for details).  Clinical Course as of 06/30/20 0821  Thu Jun 30, 2020  0729 I-stat chem with mildly elevated Cr, improved from previous.  [CS]  [id number] CBC is normal.  [CS]  0745 Coags normal.  [CS]  [id number] Head CT with signs of large L MCA stroke, per neuro, will proceed with CTA/CTP.  [CS]  [id number] CTA/CTP with MCA occlusion and MCA stroke. Patient will be taken for intervention.  [CS]  0819 CMP unremarkable. Patient to be admitted by Neurology.  [CS]    Clinical Course User Index [CS] Truddie Hidden, MD    Final Clinical Impression(s) / ED Diagnoses Final diagnoses:  Acute ischemic stroke [facility])    Rx / DC Orders ED Discharge Orders    None       Truddie Hidden, MD 06/30/20 [phone]

## 2020-06-30 NOTE — ED Notes (Signed)
[facility] prepped per Kelli Churn, RN Rapid Response RN

## 2020-06-30 NOTE — Transfer of Care (Signed)
Immediate Anesthesia Transfer of Care Note  Patient: Courtney Burke  Procedure(s) Performed: RADIOLOGY WITH ANESTHESIA (N/A )  Patient Location: PACU  Anesthesia Type:General  Level of Consciousness: sedated and Patient remains intubated per anesthesia plan  Airway & Oxygen Therapy: Patient remains intubated per anesthesia plan and Patient placed on Ventilator (see vital sign flow sheet for setting)  Post-op Assessment: Report given to RN and Post -op Vital signs reviewed and stable  Post vital signs: Reviewed and stable  Last Vitals:  Vitals Value Taken Time  BP 135/78 06/30/20 1154  Temp    Pulse 70 06/30/20 1159  Resp 14 06/30/20 1159  SpO2 100 % 06/30/20 1159  Vitals shown include unvalidated device data.  Last Pain:  Vitals:   06/30/20 0806  TempSrc: Oral  PainSc: 0-No pain         Complications: No complications documented.

## 2020-06-30 NOTE — Procedures (Signed)
Echo attempted. Patient being transported to CT. Will attempt again.

## 2020-06-30 NOTE — Procedures (Signed)
S/P Lt common carotid arteriogram followed by revascularization of occluded Lt MCA prox with x 1 pass with Tiger 18 retriever and aspiration and placement of a 2.25 x 12 mm Onyx resolute balloon mountable stent with TICI 2C revascularization. Post CT  No ICH. Contrast stain in the putamen and Lt ant parietal cortex. Hemostasisin the Rt groin with an 102F angiseal closure device. Distal pulses . All doplerable Patient left intubated to protect airway per anesthesia.. Pupils 33mm RT = Lt sluggish.  S.Jaelene Garciagarcia MD

## 2020-06-30 NOTE — Progress Notes (Signed)
ETT retracted 2cm per MD order.

## 2020-06-30 NOTE — Code Documentation (Signed)
Stroke Response Nurse Documentation Code Documentation  Courtney Burke is a 65 y.o. female arriving to [facility] ED via [facility] on 06-30-2020 with past medical hx of HTN and Asthma. Code stroke was activated by EMS. Patient from home where she was LKW at 2100 06/30/2019 and now complaining of nonverbal, Right side weakness and left gaze preference. On No antithrombotic. Stroke team at the bedside on patient arrival. Labs drawn and patient cleared for CT by Dr. Karle Starch. Patient to CT with team. NIHSS 21 , see documentation for details and code stroke times. Patient with disoriented, not following commands, left gaze preference , right hemianopia, right facial droop, right arm weakness, right leg weakness, Global aphasia , dysarthria  and right neglect on exam. The following imaging was completed:  CT, CTA head and neck, CTP. Patient is not a candidate for tPA due to outside window  Patient transported to IR.  Handoff with IR staff.Lilyan Punt, Kelli Churn  Stroke Response RN

## 2020-06-30 NOTE — Progress Notes (Signed)
Cangrelor infusion started at 1035. Infusion to run for 4 hours, pt to go to CT scan at 1435

## 2020-07-01 ENCOUNTER — Inpatient Hospital Stay (HOSPITAL_COMMUNITY): Payer: Medicare Other

## 2020-07-01 ENCOUNTER — Encounter (HOSPITAL_COMMUNITY): Payer: Self-pay | Admitting: Radiology

## 2020-07-01 DIAGNOSIS — I63512 Cerebral infarction due to unspecified occlusion or stenosis of left middle cerebral artery: Secondary | ICD-10-CM

## 2020-07-01 DIAGNOSIS — R569 Unspecified convulsions: Secondary | ICD-10-CM

## 2020-07-01 DIAGNOSIS — I639 Cerebral infarction, unspecified: Secondary | ICD-10-CM

## 2020-07-01 LAB — RAPID URINE DRUG SCREEN, HOSP PERFORMED
Amphetamines: NOT DETECTED
Barbiturates: NOT DETECTED
Benzodiazepines: NOT DETECTED
Cocaine: NOT DETECTED
Opiates: NOT DETECTED
Tetrahydrocannabinol: NOT DETECTED

## 2020-07-01 LAB — CBC WITH DIFFERENTIAL/PLATELET
Abs Immature Granulocytes: 0.05 10*3/uL (ref 0.00–0.07)
Basophils Absolute: 0 10*3/uL (ref 0.0–0.1)
Basophils Relative: 0 %
Eosinophils Absolute: 0 10*3/uL (ref 0.0–0.5)
Eosinophils Relative: 0 %
HCT: 27.1 % — ABNORMAL LOW (ref 36.0–46.0)
Hemoglobin: 9.1 g/dL — ABNORMAL LOW (ref 12.0–15.0)
Immature Granulocytes: 1 %
Lymphocytes Relative: 8 %
Lymphs Abs: 0.8 10*3/uL (ref 0.7–4.0)
MCH: 28.3 pg (ref 26.0–34.0)
MCHC: 33.6 g/dL (ref 30.0–36.0)
MCV: 84.4 fL (ref 80.0–100.0)
Monocytes Absolute: 0.7 10*3/uL (ref 0.1–1.0)
Monocytes Relative: 6 %
Neutro Abs: 9.5 10*3/uL — ABNORMAL HIGH (ref 1.7–7.7)
Neutrophils Relative %: 85 %
Platelets: 470 10*3/uL — ABNORMAL HIGH (ref 150–400)
RBC: 3.21 MIL/uL — ABNORMAL LOW (ref 3.87–5.11)
RDW: 13.7 % (ref 11.5–15.5)
WBC: 11.1 10*3/uL — ABNORMAL HIGH (ref 4.0–10.5)
nRBC: 0 % (ref 0.0–0.2)

## 2020-07-01 LAB — COMPREHENSIVE METABOLIC PANEL
ALT: 11 U/L (ref 0–44)
AST: 17 U/L (ref 15–41)
Albumin: 2.8 g/dL — ABNORMAL LOW (ref 3.5–5.0)
Alkaline Phosphatase: 82 U/L (ref 38–126)
Anion gap: 11 (ref 5–15)
BUN: 13 mg/dL (ref 8–23)
CO2: 17 mmol/L — ABNORMAL LOW (ref 22–32)
Calcium: 8 mg/dL — ABNORMAL LOW (ref 8.9–10.3)
Chloride: 113 mmol/L — ABNORMAL HIGH (ref 98–111)
Creatinine, Ser: 1.44 mg/dL — ABNORMAL HIGH (ref 0.44–1.00)
GFR, Estimated: 40 mL/min — ABNORMAL LOW (ref >60)
Glucose, Bld: 138 mg/dL — ABNORMAL HIGH (ref 70–99)
Potassium: 3.2 mmol/L — ABNORMAL LOW (ref 3.5–5.1)
Sodium: 141 mmol/L (ref 135–145)
Total Bilirubin: 0.6 mg/dL (ref 0.3–1.2)
Total Protein: 5.3 g/dL — ABNORMAL LOW (ref 6.5–8.1)

## 2020-07-01 LAB — GLUCOSE, CAPILLARY
Glucose-Capillary: 117 mg/dL — ABNORMAL HIGH (ref 70–99)
Glucose-Capillary: 119 mg/dL — ABNORMAL HIGH (ref 70–99)
Glucose-Capillary: 118 mg/dL — ABNORMAL HIGH (ref 70–99)
Glucose-Capillary: 96 mg/dL (ref 70–99)
Glucose-Capillary: 113 mg/dL — ABNORMAL HIGH (ref 70–99)
Glucose-Capillary: 100 mg/dL — ABNORMAL HIGH (ref 70–99)

## 2020-07-01 LAB — URINALYSIS, ROUTINE W REFLEX MICROSCOPIC
Bilirubin Urine: NEGATIVE
Glucose, UA: NEGATIVE mg/dL
Ketones, ur: NEGATIVE mg/dL
Nitrite: NEGATIVE
Protein, ur: NEGATIVE mg/dL
Specific Gravity, Urine: 1.017 (ref 1.005–1.030)
pH: 5 (ref 5.0–8.0)

## 2020-07-01 LAB — LIPID PANEL
Cholesterol: 143 mg/dL (ref 0–200)
HDL: 41 mg/dL (ref >40)
LDL Cholesterol: 68 mg/dL (ref 0–99)
Total CHOL/HDL Ratio: 3.5 RATIO
Triglycerides: 172 mg/dL — ABNORMAL HIGH (ref <150)
VLDL: 34 mg/dL (ref 0–40)

## 2020-07-01 LAB — HEMOGLOBIN A1C
Hgb A1c MFr Bld: 5.4 % (ref 4.8–5.6)
Mean Plasma Glucose: 108.28 mg/dL

## 2020-07-01 LAB — ECHOCARDIOGRAM COMPLETE
Area-P 1/2: 2.87 cm2
Height: 62 in
S' Lateral: 2.4 cm
Weight: 3312 oz

## 2020-07-01 LAB — TRIGLYCERIDES: Triglycerides: 164 mg/dL — ABNORMAL HIGH (ref <150)

## 2020-07-01 IMAGING — DX DG ABD PORTABLE 1V
1 series · 1 of 1 positions shown · non-contrast
Comparison: CT abdomen [DATE]

CLINICAL DATA: Feeding tube placement

EXAM:
PORTABLE ABDOMEN - 1 VIEW

[abdomen kub]
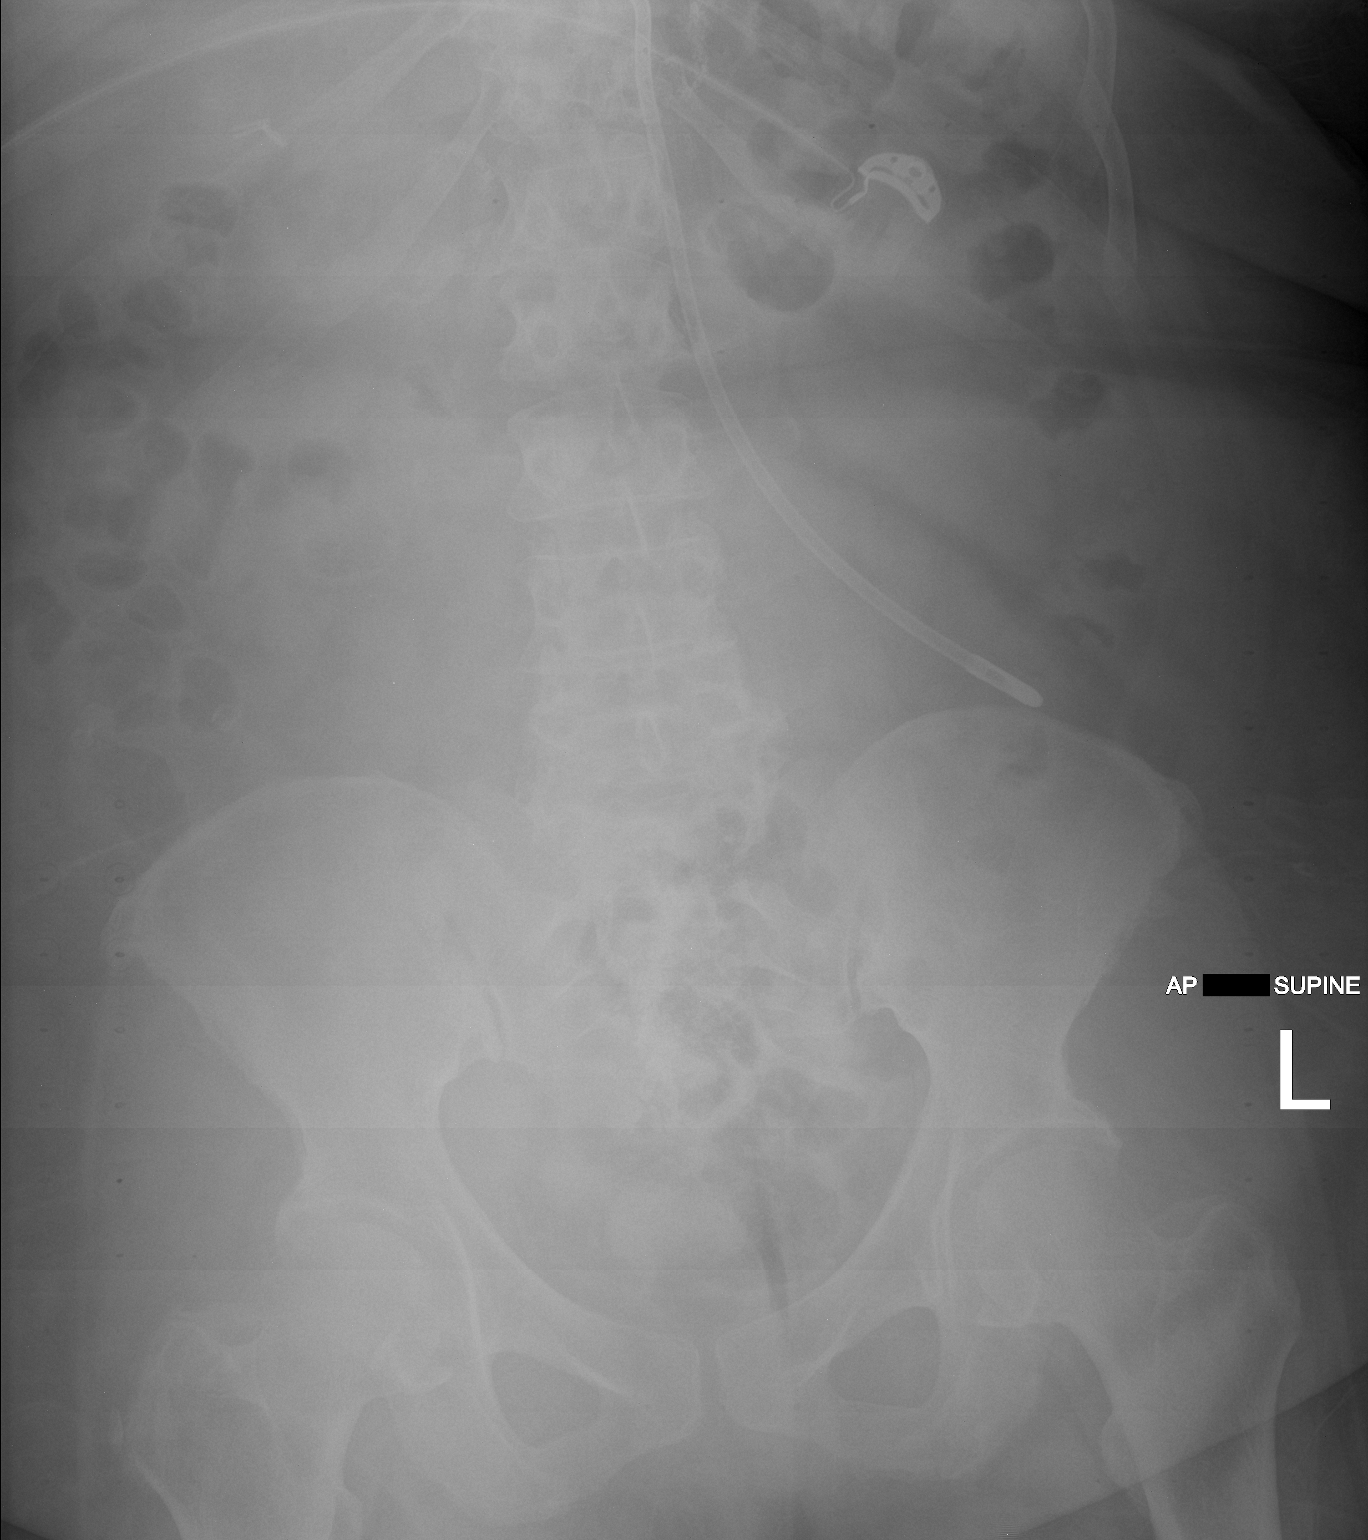

[1 of 1 positions shown; findings below may reference images not displayed]

FINDINGS: A feeding tube is noted extending inferiorly into the left to
terminate just above the left iliac crest, presumably in a somewhat
dilated stomach given the low location. Otherwise unremarkable bowel
gas pattern. Clips in the right upper quadrant likely from prior
cholecystectomy.
IMPRESSION: 1. The feeding tube extends inferiorly into the left abdomen,
presumably in a somewhat dilated stomach given the low location.
Otherwise unremarkable bowel gas pattern.
2. Status post cholecystectomy.

## 2020-07-01 IMAGING — MR MR MRA HEAD W/O CM
1 series · 18 of 48 positions shown · non-contrast
Comparison: CT head [DATE].  CTA head [DATE]

CLINICAL DATA: Stroke.  Post left MCA thrombectomy.

EXAM:
MRI HEAD WITHOUT CONTRAST
MRA HEAD WITHOUT CONTRAST
TECHNIQUE: Multiplanar, multiecho pulse sequences of the brain and surrounding
structures were obtained without intravenous contrast. Angiographic
images of the head were obtained using MRA technique without
contrast.

[Series 5: 3d cow · axial · 0.8mm · 0.43mm/px · z∈[-142,-50]mm · 18 of 130 slices shown]
[im 1/130]
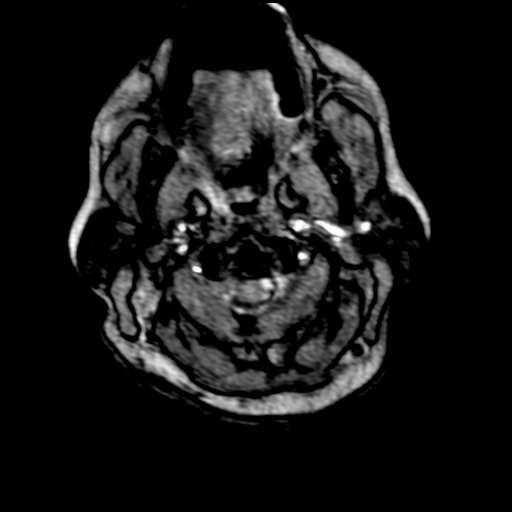
[im 3/130]
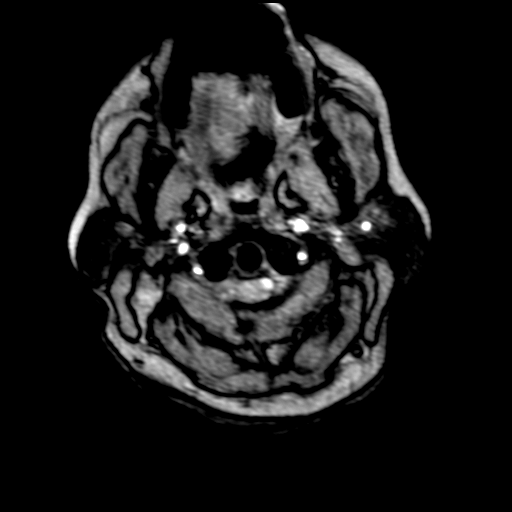
[im 6/130]
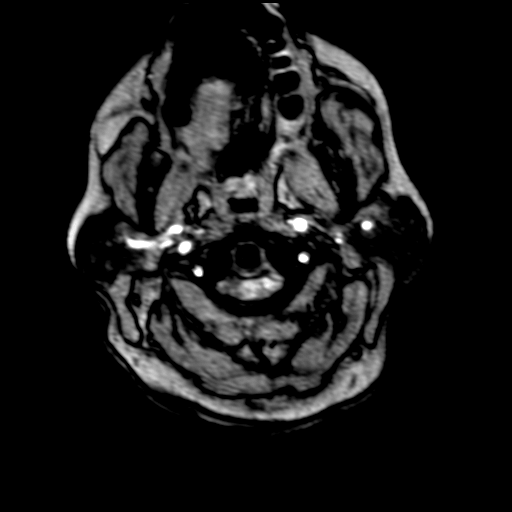
[im 9/130]
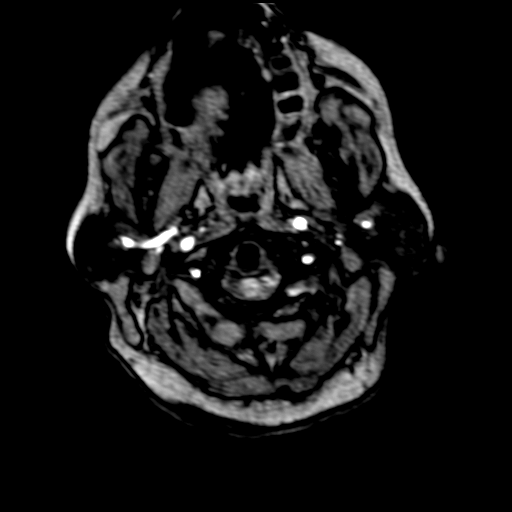
[im 11/130]
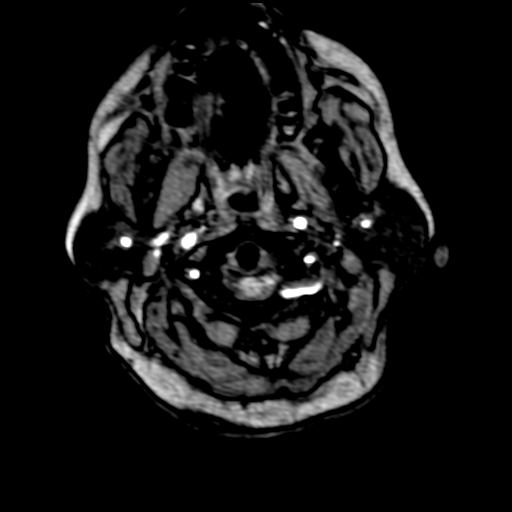
[im 14/130]
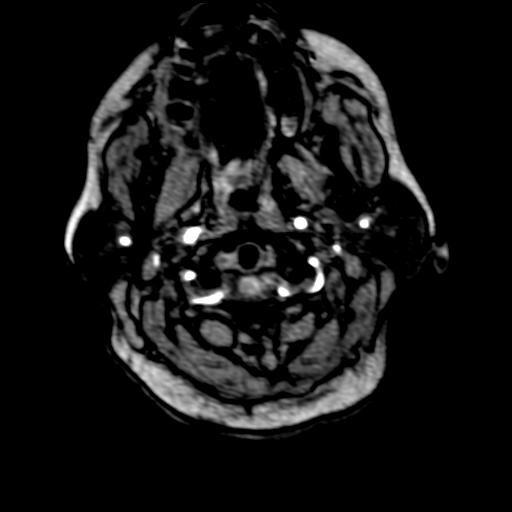
[im 17/130]
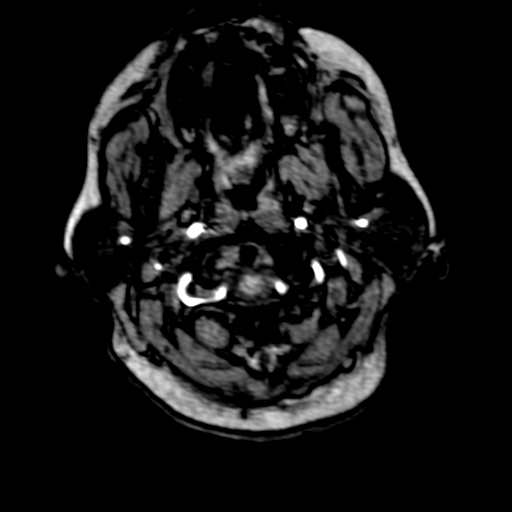
[im 20/130]
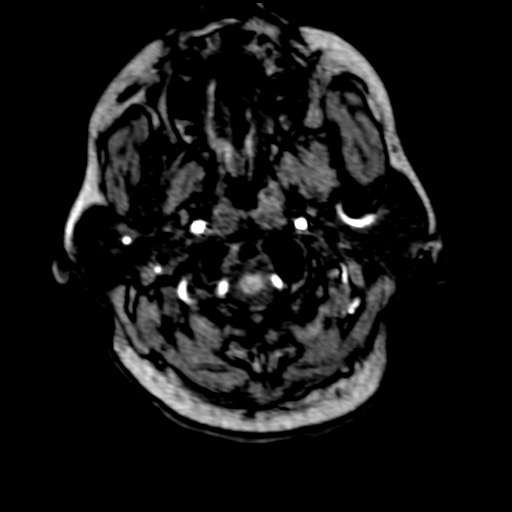
[im 22/130]
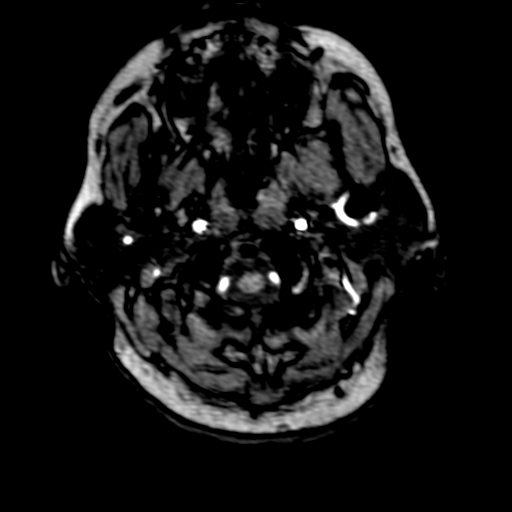
[im 25/130]
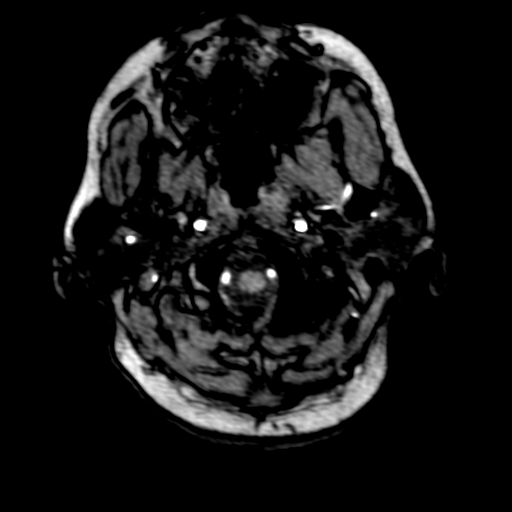
[im 42/130]
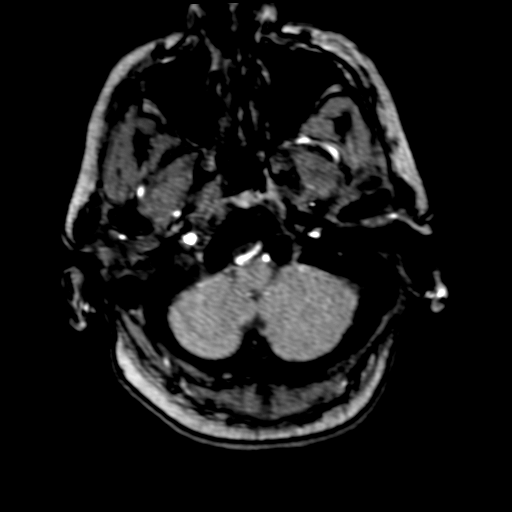
[im 58/130]
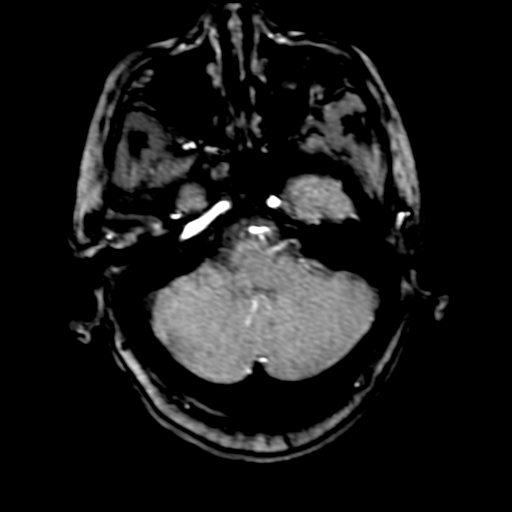
[im 66/130]
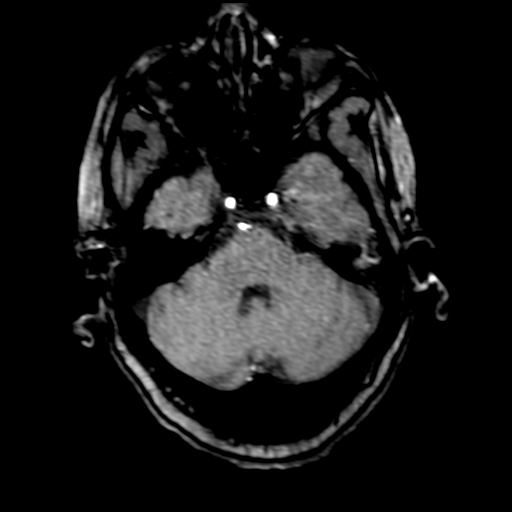
[im 75/130]
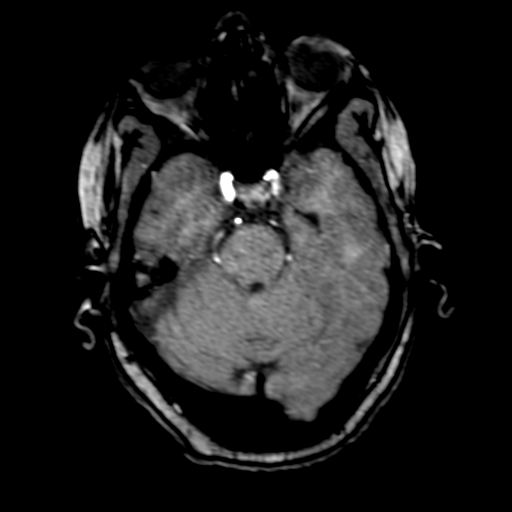
[im 91/130]
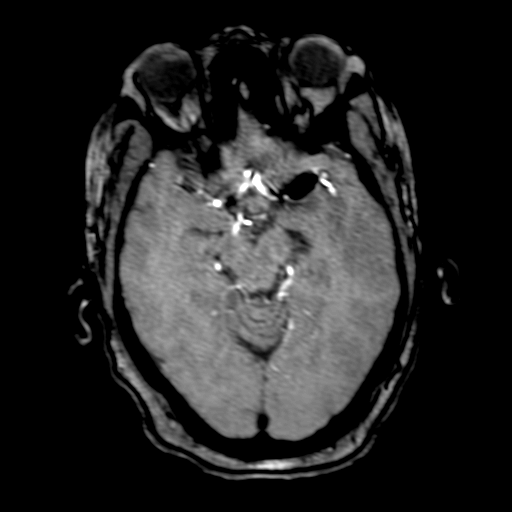
[im 108/130]
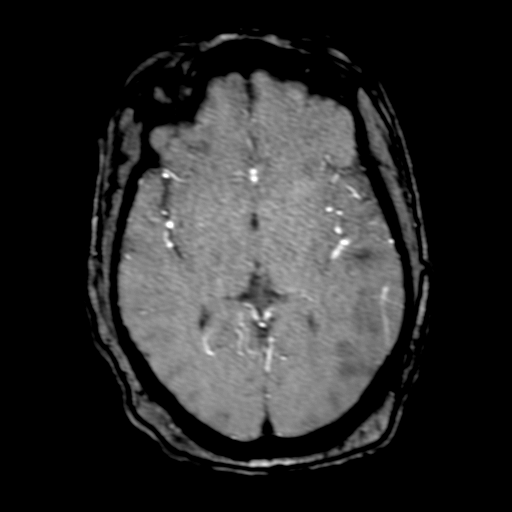
[im 110/130]
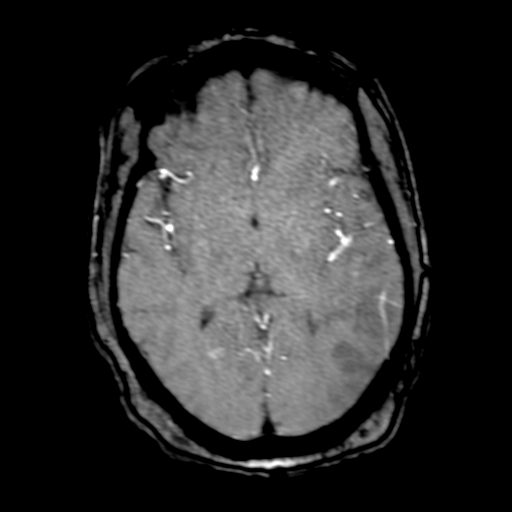
[im 124/130]
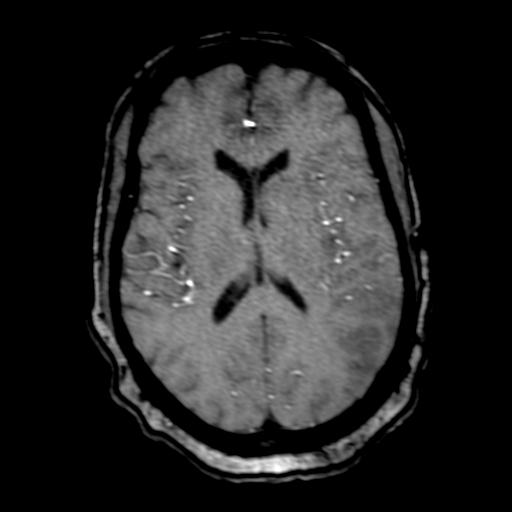

[18 of 48 positions shown; findings below may reference images not displayed]

FINDINGS: MRI HEAD FINDINGS

Brain: Acute infarct left MCA territory. There is involvement of the
left basal ganglia including the caudate and putamen. There is
infarct involving the superior left temporal lobe as well as the
insula and the left parietal lobe. Small area of hemorrhage is
present within the left posterior temporal lobe measuring
approximately 1 cm. This was hyperdense on CT yesterday. Scattered
small areas of acute infarct in the left frontal lobe. Small area of
acute infarct in the right cerebellum and in the occipital white
matter bilaterally.

Ventricle size normal.  No midline shift.  No mass lesion.

Vascular: Normal arterial flow voids

Skull and upper cervical spine: Negative

Sinuses/Orbits: Mild mucosal edema paranasal sinuses. Negative orbit

Other: None

MRA HEAD FINDINGS

Decreased signal in the carotid bilaterally at the skull base is
felt to be artifact. This area appears widely patent on CTA.
Decreased signal left cavernous carotid also artifact. Both
cavernous carotids are widely patent on CTA.

Moderate stenosis proximal right A1 segment. Mild stenosis right M1.
Moderate stenosis right MCA bifurcation.

Interval placement of stent in the left middle cerebral artery.
There is flow related signal in left MCA branches indicating the
stent is patent.

Both vertebral arteries patent to the basilar. Basilar widely
patent. Right PICA patent. Left AICA patent. Superior cerebellar and
posterior cerebral arteries patent bilaterally. Fetal origin left
posterior cerebral artery. Right posterior communicating artery is
patent.
IMPRESSION: Acute infarct left MCA territory involving the basal ganglia as well
as the left temporal, frontal, and parietal lobes. Small area of
hemorrhage in the left posterior temporal lobe. Additional small
areas of acute infarct in the occipital white matter bilaterally and
right cerebellum.

Left MCA stent appears patent. Moderate intracranial atherosclerotic
disease as above.

## 2020-07-01 IMAGING — MR MR HEAD W/O CM
12 of 13 series · 44 of 48 positions shown · non-contrast
Comparison: CT head [DATE].  CTA head [DATE]

CLINICAL DATA: Stroke.  Post left MCA thrombectomy.

EXAM:
MRI HEAD WITHOUT CONTRAST
MRA HEAD WITHOUT CONTRAST
TECHNIQUE: Multiplanar, multiecho pulse sequences of the brain and surrounding
structures were obtained without intravenous contrast. Angiographic
images of the head were obtained using MRA technique without
contrast.

[Series 5: DWI · axial · 3.0mm · 0.88mm/px · z∈[-132,+14]mm · 8 of 100 slices shown (1 of 4)]
[im 1/100]
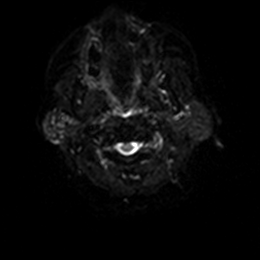
[im 15/100]
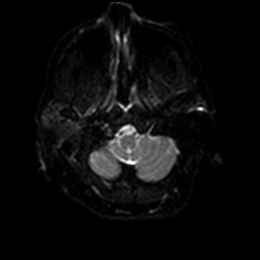
[im 29/100]
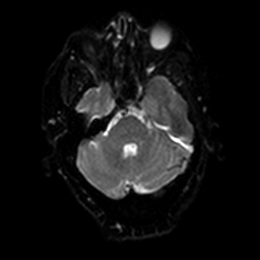
[im 43/100]
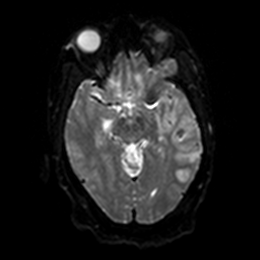
[im 57/100]
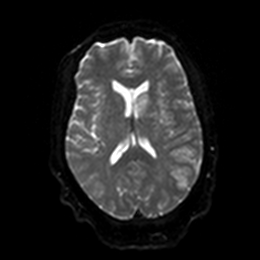
[im 71/100]
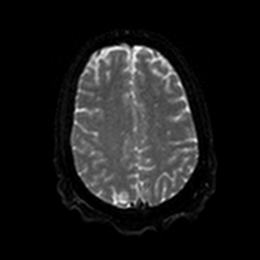
[im 85/100]
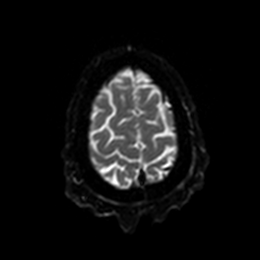
[im 100/100]
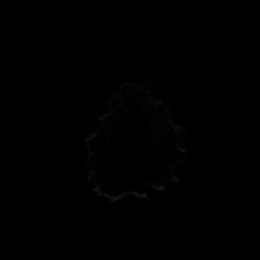

[Series 6: DWI · axial · 3.0mm · 0.88mm/px · z∈[-132,+14]mm · 4 of 50 slices shown (2 of 4)]
[im 1/50]
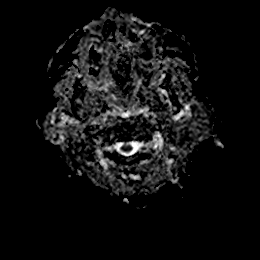
[im 17/50]
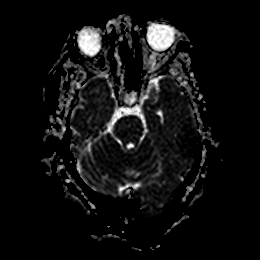
[im 33/50]
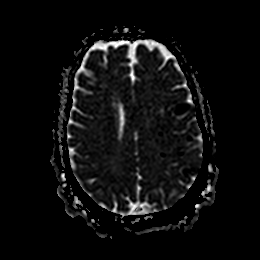
[im 50/50]
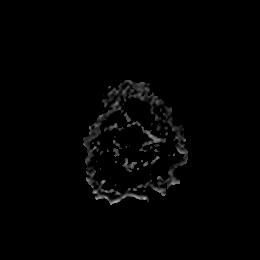

[Series 7: DWI · coronal · 4.0mm · 0.88mm/px · 5 of 72 slices shown (3 of 4)]
[im 1/72]
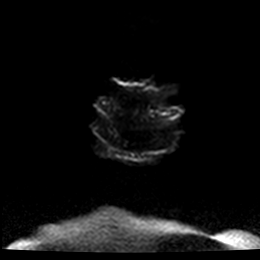
[im 18/72]
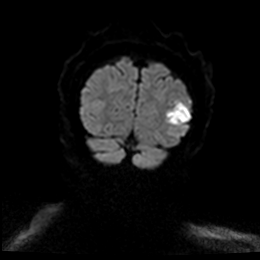
[im 36/72]
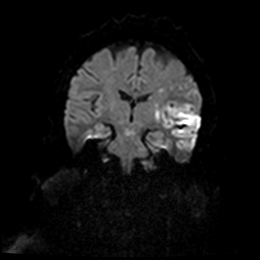
[im 54/72]
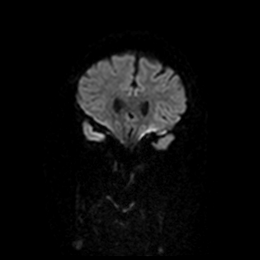
[im 72/72]
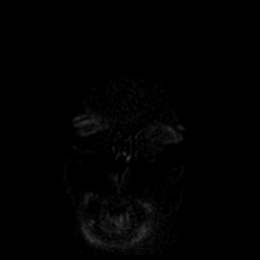

[Series 8: DWI · coronal · 4.0mm · 0.88mm/px · 3 of 36 slices shown (4 of 4)]
[im 1/36]
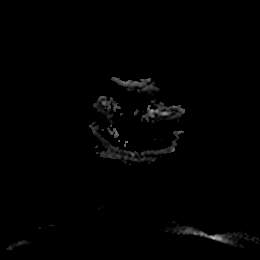
[im 18/36]
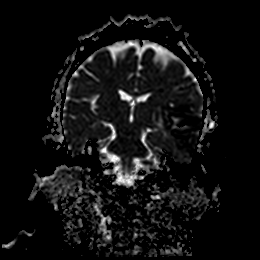
[im 36/36]
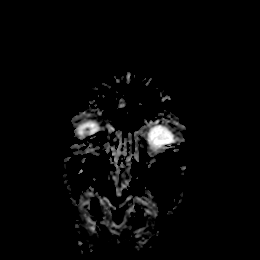

[Series 9: T1 · sagittal · 5.0mm · 0.75mm/px · 2 of 23 slices shown]
[im 1/23]
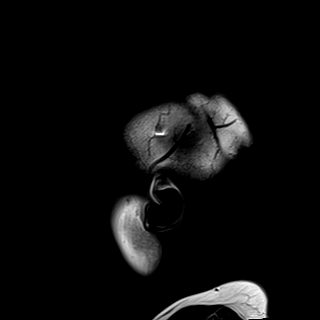
[im 23/23]
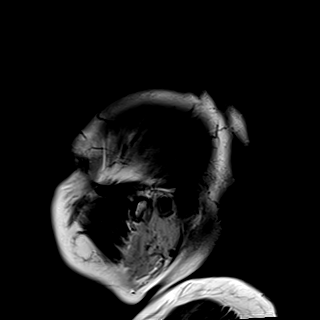

[Series 10: T2 · axial · 5.0mm · 0.72mm/px · z∈[-147,-3]mm · 2 of 25 slices shown (1 of 2)]
[im 1/25]
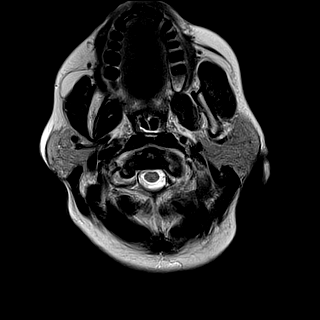
[im 25/25]
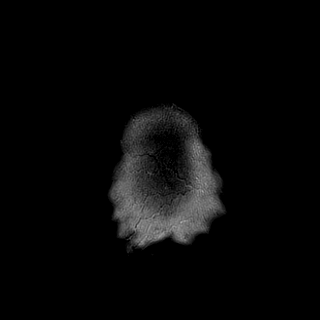

[Series 11: FLAIR · axial · 5.0mm · 0.45mm/px · z∈[-147,-3]mm · 2 of 25 slices shown]
[im 1/25]
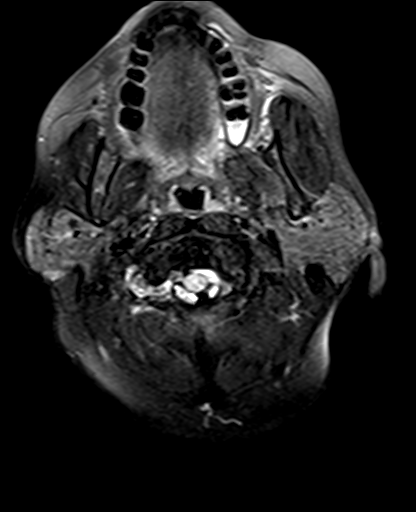
[im 25/25]
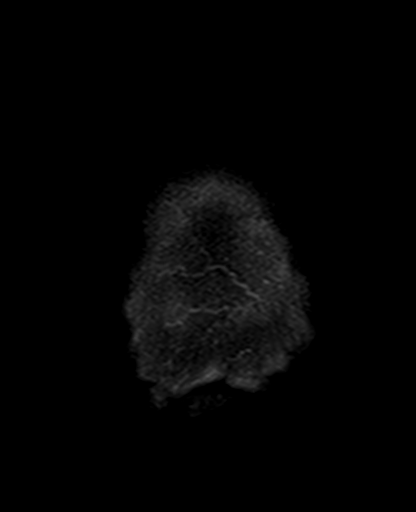

[Series 12: mag_images · axial · 3.0mm · 0.90mm/px · z∈[-159,+18]mm · 4 of 60 slices shown]
[im 1/60]
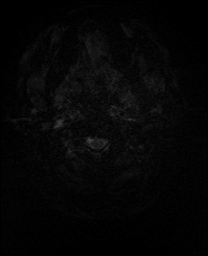
[im 20/60]
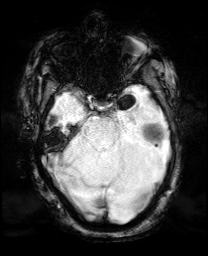
[im 40/60]
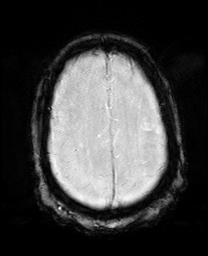
[im 60/60]
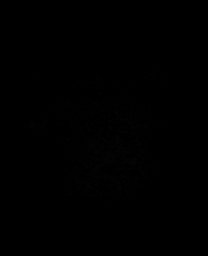

[Series 13: pha_images · axial · 3.0mm · 0.90mm/px · z∈[-159,+12]mm · 4 of 57 slices shown]
[im 1/57]
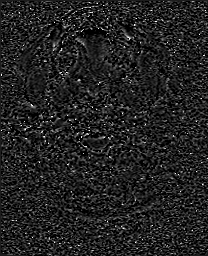
[im 19/57]
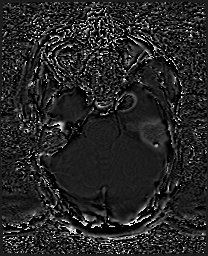
[im 38/57]
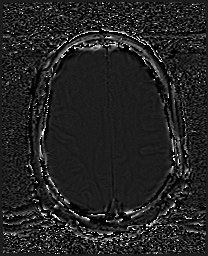
[im 57/57]
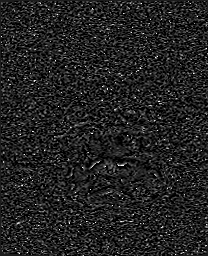

[Series 14: swi_images · axial · 3.0mm · 0.90mm/px · z∈[-159,+18]mm · 4 of 60 slices shown]
[im 1/60]
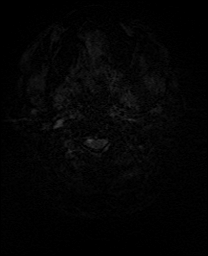
[im 20/60]
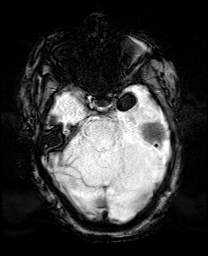
[im 40/60]
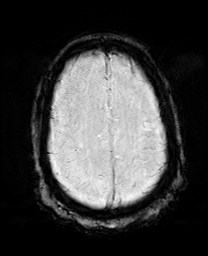
[im 60/60]
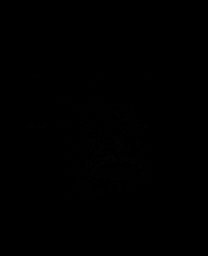

[Series 15: mip_images(sw) · axial · 24.0mm · 0.90mm/px · z∈[-148,+8]mm · 4 of 53 slices shown]
[im 1/53]
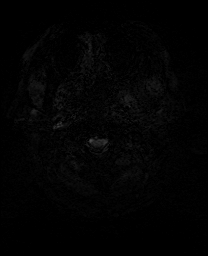
[im 18/53]
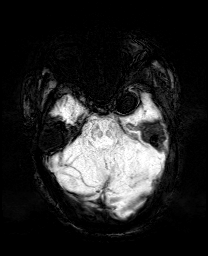
[im 35/53]
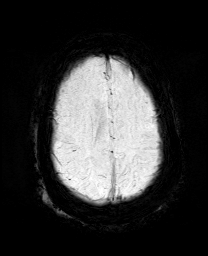
[im 53/53]
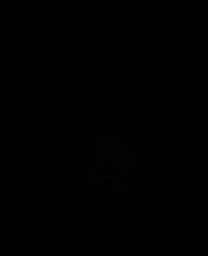

[Series 17: T2 · coronal · 5.0mm · 0.34mm/px · 2 of 29 slices shown (2 of 2)]
[im 1/29]
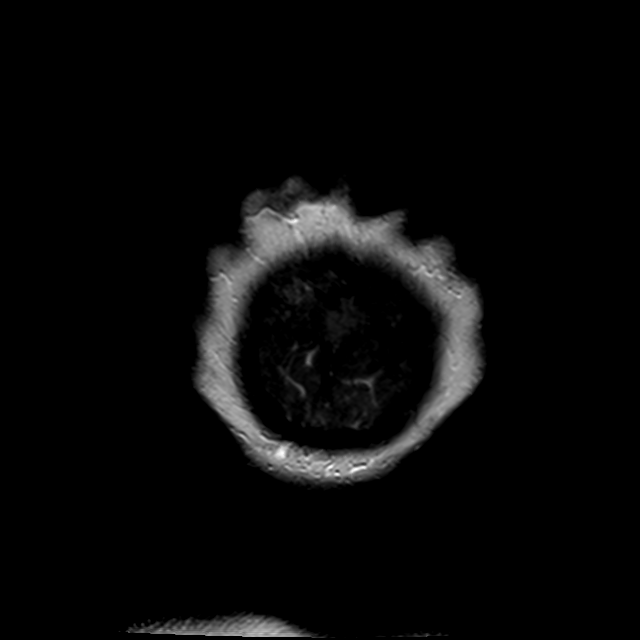
[im 29/29]
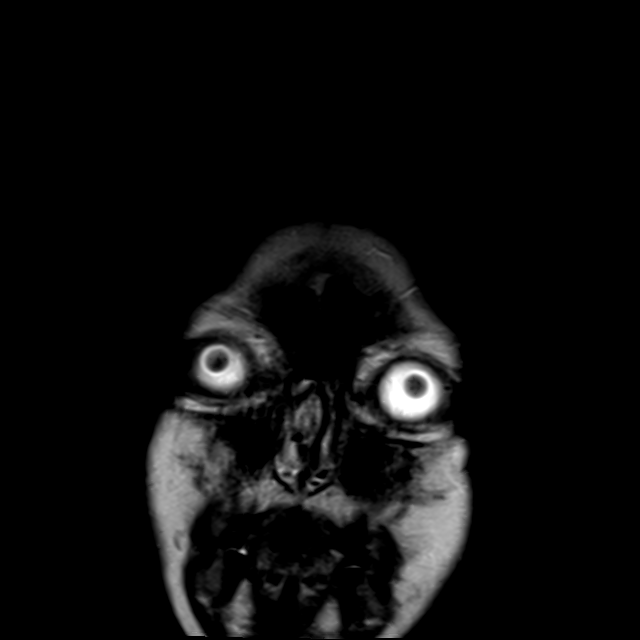

[44 of 48 positions shown; findings below may reference images not displayed]

FINDINGS: MRI HEAD FINDINGS

Brain: Acute infarct left MCA territory. There is involvement of the
left basal ganglia including the caudate and putamen. There is
infarct involving the superior left temporal lobe as well as the
insula and the left parietal lobe. Small area of hemorrhage is
present within the left posterior temporal lobe measuring
approximately 1 cm. This was hyperdense on CT yesterday. Scattered
small areas of acute infarct in the left frontal lobe. Small area of
acute infarct in the right cerebellum and in the occipital white
matter bilaterally.

Ventricle size normal.  No midline shift.  No mass lesion.

Vascular: Normal arterial flow voids

Skull and upper cervical spine: Negative

Sinuses/Orbits: Mild mucosal edema paranasal sinuses. Negative orbit

Other: None

MRA HEAD FINDINGS

Decreased signal in the carotid bilaterally at the skull base is
felt to be artifact. This area appears widely patent on CTA.
Decreased signal left cavernous carotid also artifact. Both
cavernous carotids are widely patent on CTA.

Moderate stenosis proximal right A1 segment. Mild stenosis right M1.
Moderate stenosis right MCA bifurcation.

Interval placement of stent in the left middle cerebral artery.
There is flow related signal in left MCA branches indicating the
stent is patent.

Both vertebral arteries patent to the basilar. Basilar widely
patent. Right PICA patent. Left AICA patent. Superior cerebellar and
posterior cerebral arteries patent bilaterally. Fetal origin left
posterior cerebral artery. Right posterior communicating artery is
patent.
IMPRESSION: Acute infarct left MCA territory involving the basal ganglia as well
as the left temporal, frontal, and parietal lobes. Small area of
hemorrhage in the left posterior temporal lobe. Additional small
areas of acute infarct in the occipital white matter bilaterally and
right cerebellum.

Left MCA stent appears patent. Moderate intracranial atherosclerotic
disease as above.

## 2020-07-01 MED ORDER — SODIUM CHLORIDE 0.9 % IV SOLN: INTRAVENOUS | Status: DC

## 2020-07-01 MED ORDER — ADULT MULTIVITAMIN W/MINERALS CH
1.0000 | ORAL_TABLET | Freq: Two times a day (BID) | ORAL | Status: DC
  Administered 2020-07-01 – 2020-07-05 (×6): 1
  Filled 2020-07-01 (×6): qty 1

## 2020-07-01 MED ORDER — DEXMEDETOMIDINE HCL IN NACL 400 MCG/100ML IV SOLN
0.4000 ug/kg/h | INTRAVENOUS | Status: DC
  Administered 2020-07-01: 0.7 ug/kg/h via INTRAVENOUS

## 2020-07-01 MED ORDER — POTASSIUM CHLORIDE 10 MEQ/100ML IV SOLN
10.0000 meq | INTRAVENOUS | Status: AC
  Administered 2020-07-01 (×4): 10 meq via INTRAVENOUS
  Filled 2020-07-01 (×4): qty 100

## 2020-07-01 MED ORDER — LEVETIRACETAM 500 MG PO TABS
500.0000 mg | ORAL_TABLET | Freq: Two times a day (BID) | ORAL | Status: DC
Start: 2020-07-01 — End: 2020-07-01
  Filled 2020-07-01: qty 1

## 2020-07-01 MED ORDER — ENOXAPARIN SODIUM 40 MG/0.4ML ~~LOC~~ SOLN
40.0000 mg | SUBCUTANEOUS | Status: DC
  Administered 2020-07-01 – 2020-07-08 (×8): 40 mg via SUBCUTANEOUS
  Filled 2020-07-01 (×8): qty 0.4

## 2020-07-01 MED ORDER — PROSOURCE TF PO LIQD
45.0000 mL | Freq: Two times a day (BID) | ORAL | Status: DC
  Administered 2020-07-01 – 2020-07-09 (×14): 45 mL
  Filled 2020-07-01 (×14): qty 45

## 2020-07-01 MED ORDER — ONDANSETRON HCL 4 MG/2ML IJ SOLN
4.0000 mg | Freq: Four times a day (QID) | INTRAMUSCULAR | Status: DC | PRN
  Administered 2020-07-01: 4 mg via INTRAVENOUS
  Filled 2020-07-01: qty 2

## 2020-07-01 MED ORDER — LEVETIRACETAM 100 MG/ML PO SOLN
500.0000 mg | Freq: Two times a day (BID) | ORAL | Status: DC
  Administered 2020-07-02 – 2020-07-09 (×14): 500 mg
  Filled 2020-07-01 (×13): qty 5

## 2020-07-01 MED ORDER — ADULT MULTIVITAMIN W/MINERALS CH: 1.0000 | ORAL_TABLET | Freq: Every day | ORAL | Status: DC

## 2020-07-01 MED ORDER — POTASSIUM CHLORIDE 20 MEQ PO PACK
20.0000 meq | PACK | ORAL | Status: DC
  Administered 2020-07-01: 20 meq
  Filled 2020-07-01: qty 1

## 2020-07-01 MED ORDER — OSMOLITE 1.2 CAL PO LIQD
1000.0000 mL | ORAL | Status: DC
  Administered 2020-07-01: 25 mL

## 2020-07-01 NOTE — Progress Notes (Signed)
NAME:  Courtney Burke, MRN:  [id number], DOB:  15-Nov-1955, LOS: 1 ADMISSION DATE:  06/30/2020, CONSULTATION DATE:  06/30/20 REFERRING MD:  Dr. Curly Shores, CHIEF COMPLAINT:  Intubated   Brief History:  65 year old female with a history of hypertension, diabetes, CKD stage III, asthma, vitamin D deficiency, obesity status post gastric sleeve December 2021 presented as code stroke.  Found to have left M1 occlusion and taken for IR thrombectomy.  Status post revascularization and stent placement.  PCCM consulted for vent management.  History of Present Illness:  This is a 65 year old female with a history of hypertension, diabetes, CKD stage III, asthma, vitamin D deficiency, obesity status post gastric sleeve December 2021 presented as code stroke.  Patient is currently intubated and sedated, history obtained by chart review.  Last known normal was last night around 2100, patient was found by her brother, she is standing in her nightgown in bare feet outside staring off into space, was speaking gibberish.  Brother walked patient back into the house and her daughter called 911.  Patient was noted to have her eyes rolling back into her head, had loss of consciousness for a few seconds, and was making moaning sounds and drooling.  A code stroke was called.  On arrival patient was unable to speak and had left gaze preference and right-sided hemiparesis. CT head showed acute left MCA territory infarct, aspect 9, no hemorrhage or mass-effect noted, small chronic appearing right cerebral infarct. Patient was out of window for TPA.  CTA head and neck showed emergent large vessel occlusion of left MCA.  IR performed left common carotid arteriogram with revascularization of occluded left MCA proximal and placement of 2.25x12 mm Onyx resolute balloon mountable stent with TICI 2C revascularization.  Patient was left intubated to protect airway and transferred to 4N.   Past Medical History:  Hypertension, diabetes, CKD stage  III, asthma, vitamin D deficiency, obesity  Significant Hospital Events:  1/27: IR thrombectomy and stent placement, intubated  Consults:  PCCM Neurology Neurosurgery  Procedures:  IR thrombectomy 1/27 ETT 1/27 >  Significant Diagnostic Tests:  CT head: IMPRESSION: 1. Acute Left MCA territory infarct. ASPECTS 6. No hemorrhage or mass effect at this time. 2. The above discussed by telephone with Dr. Curly Shores on 06/30/2020 at 07:32 . 3. 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  CTA head and neck: IMPRESSION: 1. Positive for emergent large vessel occlusion: Left MCA origin. Some left MCA branch reconstitution.  2. CT Perfusion underestimates infarct core (ASPECTS 6), which is estimated at 11-22 mL, subsequent estimated penumbra of 60 to 67 mL.  3. No other large vessel occlusion. But intracranial atherosclerosis with: - Severe stenosis Right ACA origin. - moderate stenosis Right MCA M2 branch origin. - mild stenosis Right MCA M1, also Vertebrobasilar junction.  4. Cervical carotid atherosclerosis without stenosis.  CT head 1/27: IMPRESSION: Probable few small areas of acute left MCA infarction as before but less apparent. Ill-defined patchy hyperdensity may reflect contrast staining and/or reperfusion petechial hemorrhage. No significant mass effect.  Micro Data:    Antimicrobials:  Cefazolin 1/27 preprocedure   Interim History / Subjective:  Episodes of emesis overnight, ordered zofran. Repleted K.   Patient is awake, opens eyes, not following commands. Currently on ventilator.   Objective   Blood pressure 119/64, pulse 67, temperature 98.2 F (36.8 C), temperature source Oral, resp. rate 16, height 5\' 2"  (1.575 m), weight 93.9 kg, last menstrual period 09/28/2012, SpO2 100 %.  Vent Mode: PRVC FiO2 (%):  [40 %-100 %] 40 % Set Rate:  [16 bmp] 16 bmp Vt Set:  [500 mL] 500 mL PEEP:  [5 cmH20] 5 cmH20 Plateau Pressure:  [19  cmH20-24 cmH20] 20 cmH20   Intake/Output Summary (Last 24 hours) at 07/01/2020 0710 Last data filed at 07/01/2020 0500 Gross per 24 hour  Intake 1869.82 ml  Output 875 ml  Net 994.82 ml   Filed Weights   06/30/20 1415  Weight: 93.9 kg    Examination: General: Critically ill appearing middle aged female, intubated, NAD HEENT: ETT in place, AT/[place] Cardiac: RRR, no m/r/g Pulmonary: Coarse breath sounds throughout, no wheezing or rhonchi noted, on ventilator support Abdomen: Soft, non-tender, non-distended, normoactive BS Extremity: Trace BL LE swelling, 2+ pulses bilaterally Neuro: Awake, opens eyes to voice, PERRLA,, not following commands, moving right arm and left leg  Resolved Hospital Problem list     Assessment & Plan:   Acute hypoxic respiratory failure requiring intubation: Intubated for IR thrombectomy and left intubated to protect airway.   Patient does have a history of asthma, is on albuterol as needed at home.  Currently on full ventilator support with PEEP 5, FiO2 40%, and  Vt 500.  Chest x-ray 1.27 showed ET tube tip at carina, no acute pulmonary edema or infiltrate. Pulled out ET tube by 2 cm.  Patient is not following commands, off fentanyl and weaning propofol. Has not been tried on SBT yet.   -VAP protocol -Continue propofol drip, wean as tolerated -Full ventilator support -Daily SBT -Daily wake up assessment  -If patient tolerating SBT can extubate later today  Left MCA LVO s/p left MCA thrombectomy and stent placement: Presented with decreased responsiveness, loss of consciousness, left gaze preference and right-sided hemiparesis.  Found to have large vessel occlusion of the MCA territory.  Status post thrombectomy and stent placement in left MCA proximal.  Post op CT showed no evidence of intracranial hemorrhage.  Of note it appears that patient has a history of CVA with small chronic appearing right cerebellar infarct on CT.  Neurology on board. Tele  monitoring showed evidence of multiple dropped beats, with no prolongation of PR intervals prior to this, concerning for Mobitz type 2.  -Continue Brilenta -Blood pressure goal 120-140 x 24 hours -ASA 81 mg daily  -Lipitor 80 mg daily -Echocardiogram - pending -A1C - 5.4 -Lipid panel - cholesterol 143, triglyceride 172, LDL 68 -Tele monitoring  -MRA and MRI brain -pending -PT/OT/SLP -Obtain EKG  Hypertension Patient is on Norvasc and Hyzaar at home.  Blood pressures ranging around 117-140/60-81.   Goal blood pressure between 120-140 x 24 hours following thrombectomy and stent placement. Has not been on cleviprex drip overnight with stable blood pressures.   -Hold home blood pressure medications -Discontinue cleviprex drip -Start PRN labetalol  Hyperlipidemia -Continue Lipitor  CKD stage III Hypokalemia Cr 1.2 on admission, last Cr noted in chart was 1.59.  Unclear if acute on chronic or near baseline CKD. Cr today 1.44. K today 3.2 800 cc UOP noted this morning.   -Repleting K -BMP in AM  Diabetes A1c 5.4 -Frequent CBGs -SSI  Obesity  Best practice (evaluated daily)  Diet: NPO Pain/Anxiety/Delirium protocol (if indicated): Fentanyl, propofol VAP protocol (if indicated): Yes DVT prophylaxis: SCDs GI prophylaxis: PPI Glucose control: SSI Mobility: PTOT Disposition:Neuro ICU  Goals of Care:  Last date of multidisciplinary goals of care discussion: Family and staff present:  Summary of discussion:  Follow up goals of care  discussion due:  Code Status: Full  Labs   CBC: Recent Labs  Lab 06/30/20 0710 06/30/20 0719 06/30/20 1405 07/01/20 0513  WBC 6.1  --   --  11.1*  NEUTROABS 3.6  --   --  9.5*  HGB 11.5* 11.2* 9.9* 9.1*  HCT 36.3 33.0* 29.0* 27.1*  MCV 88.5  --   --  84.4  PLT 507*  --   --  470*    Basic Metabolic Panel: Recent Labs  Lab 06/30/20 0710 06/30/20 0719 06/30/20 1405 07/01/20 0513  NA 140 142 142 141  K 3.5 3.6 3.4* 3.2*  CL  111 111  --  113*  CO2 20*  --   --  17*  GLUCOSE 140* 135*  --  138*  BUN 14 17  --  13  CREATININE 1.41* 1.30*  --  1.44*  CALCIUM 9.0  --   --  8.0*   GFR: Estimated Creatinine Clearance: 41.6 mL/min (A) (by C-G formula based on SCr of 1.44 mg/dL (H)). Recent Labs  Lab 06/30/20 0710 07/01/20 0513  WBC 6.1 11.1*    Liver Function Tests: Recent Labs  Lab 06/30/20 0710 07/01/20 0513  AST 21 17  ALT 15 11  ALKPHOS 101 82  BILITOT 0.5 0.6  PROT 6.4* 5.3*  ALBUMIN 3.4* 2.8*   No results for input(s): LIPASE, AMYLASE in the last 168 hours. No results for input(s): AMMONIA in the last 168 hours.  ABG    Component Value Date/Time   PHART 7.275 (L) 06/30/2020 1405   PCO2ART 33.7 06/30/2020 1405   PO2ART 377 (H) 06/30/2020 1405   HCO3 15.7 (L) 06/30/2020 1405   TCO2 17 (L) 06/30/2020 1405   ACIDBASEDEF 10.0 (H) 06/30/2020 1405   O2SAT 100.0 06/30/2020 1405     Coagulation Profile: Recent Labs  Lab 06/30/20 0710  INR 1.1    Cardiac Enzymes: No results for input(s): CKTOTAL, CKMB, CKMBINDEX, TROPONINI in the last 168 hours.  HbA1C: Hgb A1c MFr Bld  Date/Time Value Ref Range Status  07/01/2020 05:13 AM 5.4 4.8 - 5.6 % Final    Comment:    (NOTE) Pre diabetes:          5.7%-6.4%  Diabetes:              >6.4%  Glycemic control for   <7.0% adults with diabetes     CBG: Recent Labs  Lab 06/30/20 1151 06/30/20 1532 06/30/20 1928 06/30/20 2325 07/01/20 0318  GLUCAP 135* 192* 148* 137* 117*    Review of Systems:   Per HPI.   Past Medical History:  She,  has a past medical history of Asthma, Borderline diabetes, and Hypertension.   Surgical History:   Past Surgical History:  Procedure Laterality Date  . ANKLE SURGERY    . CARPAL TUNNEL RELEASE    . carpel tunnel       Social History:   reports that she has never smoked. She has never used smokeless tobacco. She reports that she does not drink alcohol and does not use drugs.   Family  History:  Her family history is not on file.   Allergies Allergies  Allergen Reactions  . Codeine      Home Medications  Prior to Admission medications   Medication Sig Start Date End Date Taking? Authorizing Provider  albuterol (PROVENTIL HFA;VENTOLIN HFA) 108 (90 BASE) MCG/ACT inhaler Inhale 2 puffs into the lungs every 6 (six) hours as needed for wheezing.    [provider]  allopurinol (ZYLOPRIM) 100 MG tablet Take 100 mg by mouth daily.    [provider]  amLODipine (NORVASC) 10 MG tablet Take 10 mg by mouth daily.    [provider]  aspirin 81 MG tablet Take 81 mg by mouth daily.    [provider]  atorvastatin (LIPITOR) 40 MG tablet Take 40 mg by mouth daily.    [provider]  dicyclomine (BENTYL) 20 MG tablet Take 1 tablet (20 mg total) by mouth 2 (two) times daily as needed (Abdominal pain). 07/08/19   Ward, Ozella Almond, PA-C  linagliptin (TRADJENTA) 5 MG TABS tablet Take 5 mg by mouth daily.    [provider]  losartan-hydrochlorothiazide (HYZAAR) 100-12.5 MG per tablet Take 1 tablet by mouth daily.    [provider]  metFORMIN (GLUCOPHAGE) 500 MG tablet Take 500 mg by mouth 3 (three) times daily.    [provider]  oxybutynin (DITROPAN) 5 MG tablet Take 5 mg by mouth 3 (three) times daily.    [provider]     Critical care time: 35 minutes     Asencion Noble, M.D. PGY3 07/01/2020 7:10 AM

## 2020-07-01 NOTE — Progress Notes (Signed)
Inpatient Rehab Admissions Coordinator Note:   Per therapy recommendations, pt was screened for CIR candidacy by Shann Medal, PT, DPT.  At this time we are recommending an inpatient rehab consult and I will place an order per our protocol.  Please contact me with questions.   Shann Medal, PT, DPT [phone] 07/01/20 4:33 PM

## 2020-07-01 NOTE — Evaluation (Signed)
Clinical/Bedside Swallow Evaluation Patient Details  Name: Courtney Burke MRN: [id number] Date of Birth: 16-Nov-1955  Today's Date: 07/01/2020 Time: SLP Start Time (ACUTE ONLY): 1253 SLP Stop Time (ACUTE ONLY): 1300 SLP Time Calculation (min) (ACUTE ONLY): 7 min  Past Medical History:  Past Medical History:  Diagnosis Date  . Asthma   . Borderline diabetes   . Hypertension    Past Surgical History:  Past Surgical History:  Procedure Laterality Date  . ANKLE SURGERY    . CARPAL TUNNEL RELEASE    . carpel tunnel    . RADIOLOGY WITH ANESTHESIA N/A 06/30/2020   Procedure: RADIOLOGY WITH ANESTHESIA;  Surgeon: Radiologist, Medication, MD;  Location: [facility];  Service: Radiology;  Laterality: N/A;   HPI:  Courtney Burke is a 65 y.o. 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 CT probable few small areas of acute left MCA infarction, less apparent. Ill-defined patchy hyperdensity may reflect contrast. Found to have M1 occlusion and underwent thrombectomy and intubated 1/27-1/28 am. MRI pending.   Assessment / Plan / Recommendation Clinical Impression  Pt extubated this morning after brief intubation for IR procedure yesterday. She is nonverbal, exhibiting oral and verbal apraxia. Followed commands to open mouth; unable to protrude or lateralize tongue although attempting. She consumed approximately 2 oz of water (not consecutively) and after 3 minutes had a weak and ineffective reflexive cough lasting the duration of the session. No other consistencies given. She will benefit from Cortrak with continued ST intervention moving toward instrumental exam. Continue oral care. SLP Visit Diagnosis: Dysphagia, unspecified (R13.10)    Aspiration Risk  Moderate aspiration risk;Severe aspiration risk    Diet Recommendation NPO   Medication Administration: Via alternative means     Other  Recommendations Oral Care Recommendations: Oral care QID   Follow up Recommendations Skilled Nursing facility (possibly [facility])      Frequency and Duration min 2x/week  2 weeks       Prognosis Prognosis for Safe Diet Advancement: Good Barriers to Reach Goals: Severity of deficits      Swallow Study   General Date of Onset: 06/30/20 HPI: Courtney Burke is a 65 y.o. 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 CT probable few small areas of acute left MCA infarction, less apparent. Ill-defined patchy hyperdensity may reflect contrast. Found to have M1 occlusion and underwent thrombectomy and intubated 1/27-1/28 am. MRI pending. Type of Study: Bedside Swallow Evaluation Previous Swallow Assessment: none Diet Prior to this Study: NPO Temperature Spikes Noted: No Respiratory Status: Nasal cannula History of Recent Intubation: Yes Length of Intubations (days):  (< 24 hours) Date extubated: 07/01/20 (am) Behavior/Cognition: Alert;Requires cueing Oral Cavity Assessment: Other (comment) (lingual candidias) Oral Care Completed by SLP: Recent completion by staff Oral Cavity - Dentition: Adequate natural dentition Vision:  (right inattention) Self-Feeding Abilities: Needs assist Patient Positioning: Upright in bed Baseline Vocal Quality: Other (comment) (non verbal) Volitional Cough: Other (Comment) (uanble) Volitional Swallow: Unable to elicit    Oral/Motor/Sensory Function Overall Oral Motor/Sensory Function: Other (comment) (facial/lingual apraxia)   Ice Chips Ice chips: Not tested   Thin Liquid Thin Liquid: Impaired Presentation: Straw;Cup Pharyngeal  Phase Impairments: Suspected delayed Swallow    Nectar Thick Nectar Thick Liquid: Not  tested   Honey Thick     Puree Puree: Not tested   Solid     Solid: Not tested      Courtney Burke 07/01/2020,2:53 PM  Courtney Burke.Ed Risk analyst [phone] Office [phone]

## 2020-07-01 NOTE — Evaluation (Signed)
Speech Language Pathology Evaluation Patient Details Name: Courtney Burke MRN: [id number] DOB: 12/20/1955 Today's Date: 07/01/2020 Time: 1253-1300 SLP Time Calculation (min) (ACUTE ONLY): 7 min  Problem List:  Patient Active Problem List   Diagnosis Date Noted  . Acute ischemic left MCA stroke ([place]) 06/30/2020  . 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 Asthma   . Borderline diabetes   . Hypertension    Past Surgical History:  Past Surgical History:  Procedure Laterality Date  . ANKLE SURGERY    . CARPAL TUNNEL RELEASE    . carpel tunnel    . RADIOLOGY WITH ANESTHESIA N/A 06/30/2020   Procedure: RADIOLOGY WITH ANESTHESIA;  Surgeon: Radiologist, Medication, MD;  Location: [facility];  Service: Radiology;  Laterality: N/A;   HPI:  Lesslie Mossa is a 65 y.o. 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 CT probable few small areas of acute left MCA infarction, less apparent. Ill-defined patchy hyperdensity may reflect contrast. Found to have M1 occlusion and underwent thrombectomy and intubated 1/27-1/28 am. MRI pending.   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 Attempted to follow commands during oral-motor exam- opened oral cavity but could not lateralize or protrude. No spontaneous or elicited vocalizations. He nooded yes appropriately to one biographical question. She did not respond to additional y/n question -pt was also distracted by continuous cough following BSE. Diagnostic treatment of her comprehension and cognition to be continued.    SLP Assessment  SLP Recommendation/Assessment: Patient needs continued Speech Lanaguage Pathology Services SLP Visit  Diagnosis: Aphasia (R47.01);Apraxia (R48.2);Cognitive communication deficit (R41.841)    Follow Up Recommendations  Skilled Nursing facility    Frequency and Duration min 2x/week  2 weeks      SLP Evaluation Cognition  Overall Cognitive Status: Impaired/Different from baseline Arousal/Alertness: Awake/alert Orientation Level: Oriented to person (via y/n, no response to other y/n questions) Attention: Sustained Sustained Attention: Impaired Sustained Attention Impairment: Functional basic Memory:  (TBA) Awareness: Impaired Awareness Impairment: Emergent impairment Problem Solving:  (will assess further) Safety/Judgment: Impaired       Comprehension  Auditory Comprehension Overall Auditory Comprehension: Impaired Yes/No Questions: Impaired Basic Biographical Questions:  (responded to one y/n question accurately re: name) Commands: Impaired One Step Basic Commands: 0-24% accurate (distracted by cough after BSE) Visual Recognition/Discrimination Discrimination: Not tested Reading Comprehension Reading Status:  (TBA)    Expression Expression Primary Mode of Expression: Nonverbal - gestures (no gestures observed) Verbal Expression Overall Verbal Expression: Impaired Initiation: Impaired Level of Generative/Spontaneous Verbalization:  (non verbal) Naming: Not tested Pragmatics: Impairment Impairments: Abnormal affect;Eye contact Written Expression Written Expression:  (TBA)   Oral / Motor  Oral Motor/Sensory Function Overall Oral Motor/Sensory Function:  (facial/lingual apezxia) Motor Speech Overall Motor Speech: Impaired Phonation: Other (comment) (verbal apraxia) Intelligibility: Unable to assess (comment) Motor Planning: Impaired   GO                    Houston Siren 07/01/2020, 3:16 PM Orbie Pyo Aleeyah Bensen M.Ed Risk analyst [phone] Office [phone]

## 2020-07-01 NOTE — Progress Notes (Addendum)
Referring Physician(s): Code stroke- Bhagat, Srishti L (neurology)  Supervising Physician: Julieanne Cotton  Patient Status:  [facility] - In-pt  Chief Complaint: Stroke F/U  Subjective:  History of acute CVA s/p cerebral arteriogram with emergent mechanical thrombectomy and stent placement of left MCA occlusion achieving a TICI 2C revascularization via right femoral approach 06/30/2020 by Dr. Corliss Skains. Patient awake and alert sitting up in bed, tearful on exam, intubated without sedation. Intermittently following simple commands. Left gaze unable to cross midline. Moving all extremities. Right femoral puncture site c/d/i.   Allergies: Codeine  Medications: Prior to Admission medications   Medication Sig Start Date End Date Taking? Authorizing Provider  albuterol (PROVENTIL HFA;VENTOLIN HFA) 108 (90 BASE) MCG/ACT inhaler Inhale 2 puffs into the lungs every 6 (six) hours as needed for wheezing.    [provider]  allopurinol (ZYLOPRIM) 100 MG tablet Take 100 mg by mouth daily.    [provider]  amLODipine (NORVASC) 10 MG tablet Take 10 mg by mouth daily.    [provider]  aspirin 81 MG tablet Take 81 mg by mouth daily.    [provider]  atorvastatin (LIPITOR) 40 MG tablet Take 40 mg by mouth daily.    [provider]  dicyclomine (BENTYL) 20 MG tablet Take 1 tablet (20 mg total) by mouth 2 (two) times daily as needed (Abdominal pain). 07/08/19   Ward, Chase Picket, PA-C  linagliptin (TRADJENTA) 5 MG TABS tablet Take 5 mg by mouth daily.    [provider]  losartan-hydrochlorothiazide (HYZAAR) 100-12.5 MG per tablet Take 1 tablet by mouth daily.    [provider]  metFORMIN (GLUCOPHAGE) 500 MG tablet Take 500 mg by mouth 3 (three) times daily.    [provider]  oxybutynin (DITROPAN) 5 MG tablet Take 5 mg by mouth 3 (three) times daily.    [provider]     Vital Signs: BP 119/64   Pulse  81   Temp 98.4 F (36.9 C) (Axillary)   Resp (!) 23   Ht 5\' 2"  (1.575 m)   Wt 207 lb (93.9 kg)   LMP 09/28/2012   SpO2 100%   BMI 37.86 kg/m   Physical Exam Constitutional:      General: She is not in acute distress.    Comments: Intubated without sedation.  Pulmonary:     Effort: Pulmonary effort is normal. No respiratory distress.     Comments: Intubated without sedation. Skin:    General: Skin is warm and dry.     Comments: Right femoral puncture site soft without active bleeding or hematoma.  Neurological:     Mental Status: She is alert.     Comments: Awake and alert, intubated without sedation. Intermittently follows simple commands. PERRL bilaterally. Demonstrates left gaze deviation unable to cross midline. Can spontaneously move all extremities. Distal pulses (DPs) 1+ bilaterally.   Psychiatric:     Comments: Tearful on exam.     Imaging: CT Code Stroke CTA Head W/WO contrast  Result Date: 06/30/2020 CLINICAL DATA:  65 year old female code stroke presentation with left MCA ASPECTS 6. Left gaze deviation. EXAM: CT ANGIOGRAPHY HEAD AND NECK CT PERFUSION BRAIN TECHNIQUE: Multidetector CT imaging of the head and neck was performed using the standard protocol during bolus administration of intravenous contrast. Multiplanar CT image reconstructions and MIPs were obtained to evaluate the vascular anatomy. Carotid stenosis measurements (when applicable) are obtained utilizing NASCET criteria, using the distal internal carotid diameter as the denominator. Multiphase  CT imaging of the brain was performed following IV bolus contrast injection. Subsequent parametric perfusion maps were calculated using RAPID software. CONTRAST:  113mL OMNIPAQUE IOHEXOL 350 MG/ML SOLN COMPARISON:  Plain head CT today 0721 hours. [facility] brain MRI 02/15/2016 FINDINGS: CT Brain Perfusion Findings: ASPECTS: 6 CBF (<30%) Volume: 35mL (erroneous). Using CBF  less than 38% 11 mL of parenchyma is detected which does partially correspond to the area of cytotoxic edema by plain CT. Perfusion (Tmax>6.0s) volume: 39mL, hypoperfusion index 0.1 but also might be spurrious. Mismatch Volume: Calculated is not accurate due to erroneously low infarct core, estimated penumbra given the above is 60 to 67 mL. Infarction Location:Left MCA The above was discussed by telephone with Dr. Lesleigh Noe on 06/30/2020 at 07:49 . CTA NECK Skeleton: No acute osseous abnormality identified. Dystrophic/degenerative calcifications of the longus coli muscle insertion at C1-C2. Upper chest: Negative. Other neck: No acute finding. Incidental contrast reflux into a left posterior neck vein. Aortic arch: Slightly bovine arch configuration. No arch atherosclerosis. Right carotid system: Mildly tortuous proximal right CCA. Moderate calcified plaque at the right ICA origin with less than 50 % stenosis with respect to the distal vessel. Mild tortuosity. Left carotid system: Patent, mildly tortuous left CCA. Mild to moderate calcified plaque at the posterior left ICA origin with less than 50 % stenosis with respect to the distal vessel. Mildly tortuous. Vertebral arteries: Negative proximal right subclavian artery and right vertebral artery origin. The right vertebral is mildly tortuous but patent to the skull base without stenosis. Moderate soft more than calcified plaque in the proximal left subclavian artery although no significant stenosis. Left vertebral artery origin remains normal. Codominant left vertebral artery is patent to the skull base without stenosis. CTA HEAD Posterior circulation: Mild right V4 calcified plaque. Patent distal vertebral arteries to the basilar with mild stenosis at the vertebrobasilar junction. Diminutive PICA. Left AICA appears dominant. Patent basilar artery is diminutive but without stenosis. Patent SCA origins with fetal type bilateral PCA origins. Patent basilar tip.  Bilateral PCA branches are within normal limits. Anterior circulation: Both ICA siphons are patent. No significant siphon plaque or stenosis. Normal posterior communicating artery origins. Patent carotid termini although the left MCA origin is occluded (series 10, image 19). There is some left MCA branch reconstitution as seen on series 13, image 43. Left ACA origin is normal. There is severe stenosis at the right ACA origin. Anterior communicating artery is diminutive. Bilateral A2 branches appear symmetric and within normal limits. Right MCA origin is patent but also irregular with mild stenosis. Right MCA M1 and right MCA bifurcation are patent, although with moderate stenosis at the posterior right MCA M2 origin (series 12, image 10). No right MCA branch occlusion identified. Venous sinuses: Early contrast timing, grossly patent. Anatomic variants: Fetal type PCA origins. Review of the MIP images confirms the above findings IMPRESSION: 1. Positive for emergent large vessel occlusion: Left MCA origin. Some left MCA branch reconstitution. 2. CT Perfusion underestimates infarct core (ASPECTS 6), which is estimated at 11-22 mL, subsequent estimated penumbra of 60 to 67 mL. 3. No other large vessel occlusion. But intracranial atherosclerosis with: - Severe stenosis Right ACA origin. - moderate stenosis Right MCA M2 branch origin. - mild stenosis Right MCA M1, also Vertebrobasilar junction. 4. Cervical carotid atherosclerosis without stenosis. #1 discussed by telephone with Dr. Lesleigh Noe on 06/30/2020 at 0732 hours. And Salient CTP findings discussed at 0749 hours. Electronically Signed  By: Genevie Ann M.D.   On: 06/30/2020 07:58   CT HEAD WO CONTRAST  Result Date: 06/30/2020 CLINICAL DATA:  Stroke, left MCA occlusion post thrombectomy and stenting EXAM: CT HEAD WITHOUT CONTRAST TECHNIQUE: Contiguous axial images were obtained from the base of the skull through the vertex without intravenous contrast.  COMPARISON:  Earlier same day FINDINGS: Brain: Probable few patchy areas of loss of gray-white differentiation are identified in the left MCA territory. This is less apparent than on the prior study. There is some ill-defined patchy hyperdensity, most discrete in the superior left temporal lobe on axial series 3, image 14. There is no significant mass effect. No hydrocephalus. Small chronic right cerebellar infarct. Vascular: New stent of the left M1 MCA. Skull: Calvarium is unremarkable. Sinuses/Orbits: No acute finding. Other: None. IMPRESSION: Probable few small areas of acute left MCA infarction as before but less apparent. Ill-defined patchy hyperdensity may reflect contrast staining and/or reperfusion petechial hemorrhage. No significant mass effect. Electronically Signed   By: Macy Mis M.D.   On: 06/30/2020 16:41   CT Code Stroke CTA Neck W/WO contrast  Result Date: 06/30/2020 CLINICAL DATA:  64 year old female code stroke presentation with left MCA ASPECTS 6. Left gaze deviation. EXAM: CT ANGIOGRAPHY HEAD AND NECK CT PERFUSION BRAIN TECHNIQUE: Multidetector CT imaging of the head and neck was performed using the standard protocol during bolus administration of intravenous contrast. Multiplanar CT image reconstructions and MIPs were obtained to evaluate the vascular anatomy. Carotid stenosis measurements (when applicable) are obtained utilizing NASCET criteria, using the distal internal carotid diameter as the denominator. Multiphase CT imaging of the brain was performed following IV bolus contrast injection. Subsequent parametric perfusion maps were calculated using RAPID software. CONTRAST:  175mL OMNIPAQUE IOHEXOL 350 MG/ML SOLN COMPARISON:  Plain head CT today 0721 hours. [facility] brain MRI 02/15/2016 FINDINGS: CT Brain Perfusion Findings: ASPECTS: 6 CBF (<30%) Volume: 16mL (erroneous). Using CBF less than 38% 11 mL of parenchyma is detected which does  partially correspond to the area of cytotoxic edema by plain CT. Perfusion (Tmax>6.0s) volume: 47mL, hypoperfusion index 0.1 but also might be spurrious. Mismatch Volume: Calculated is not accurate due to erroneously low infarct core, estimated penumbra given the above is 60 to 67 mL. Infarction Location:Left MCA The above was discussed by telephone with Dr. Lesleigh Noe on 06/30/2020 at 07:49 . CTA NECK Skeleton: No acute osseous abnormality identified. Dystrophic/degenerative calcifications of the longus coli muscle insertion at C1-C2. Upper chest: Negative. Other neck: No acute finding. Incidental contrast reflux into a left posterior neck vein. Aortic arch: Slightly bovine arch configuration. No arch atherosclerosis. Right carotid system: Mildly tortuous proximal right CCA. Moderate calcified plaque at the right ICA origin with less than 50 % stenosis with respect to the distal vessel. Mild tortuosity. Left carotid system: Patent, mildly tortuous left CCA. Mild to moderate calcified plaque at the posterior left ICA origin with less than 50 % stenosis with respect to the distal vessel. Mildly tortuous. Vertebral arteries: Negative proximal right subclavian artery and right vertebral artery origin. The right vertebral is mildly tortuous but patent to the skull base without stenosis. Moderate soft more than calcified plaque in the proximal left subclavian artery although no significant stenosis. Left vertebral artery origin remains normal. Codominant left vertebral artery is patent to the skull base without stenosis. CTA HEAD Posterior circulation: Mild right V4 calcified plaque. Patent distal vertebral arteries to the basilar with mild stenosis at the vertebrobasilar  junction. Diminutive PICA. Left AICA appears dominant. Patent basilar artery is diminutive but without stenosis. Patent SCA origins with fetal type bilateral PCA origins. Patent basilar tip. Bilateral PCA branches are within normal limits. Anterior  circulation: Both ICA siphons are patent. No significant siphon plaque or stenosis. Normal posterior communicating artery origins. Patent carotid termini although the left MCA origin is occluded (series 10, image 19). There is some left MCA branch reconstitution as seen on series 13, image 43. Left ACA origin is normal. There is severe stenosis at the right ACA origin. Anterior communicating artery is diminutive. Bilateral A2 branches appear symmetric and within normal limits. Right MCA origin is patent but also irregular with mild stenosis. Right MCA M1 and right MCA bifurcation are patent, although with moderate stenosis at the posterior right MCA M2 origin (series 12, image 10). No right MCA branch occlusion identified. Venous sinuses: Early contrast timing, grossly patent. Anatomic variants: Fetal type PCA origins. Review of the MIP images confirms the above findings IMPRESSION: 1. Positive for emergent large vessel occlusion: Left MCA origin. Some left MCA branch reconstitution. 2. CT Perfusion underestimates infarct core (ASPECTS 6), which is estimated at 11-22 mL, subsequent estimated penumbra of 60 to 67 mL. 3. No other large vessel occlusion. But intracranial atherosclerosis with: - Severe stenosis Right ACA origin. - moderate stenosis Right MCA M2 branch origin. - mild stenosis Right MCA M1, also Vertebrobasilar junction. 4. Cervical carotid atherosclerosis without stenosis. #1 discussed by telephone with Dr. Lesleigh Noe on 06/30/2020 at 0732 hours. And Salient CTP findings discussed at 0749 hours. Electronically Signed   By: Genevie Ann M.D.   On: 06/30/2020 07:58   CT Code Stroke Cerebral Perfusion with contrast  Result Date: 06/30/2020 CLINICAL DATA:  65 year old female code stroke presentation with left MCA ASPECTS 6. Left gaze deviation. EXAM: CT ANGIOGRAPHY HEAD AND NECK CT PERFUSION BRAIN TECHNIQUE: Multidetector CT imaging of the head and neck was performed using the standard protocol during  bolus administration of intravenous contrast. Multiplanar CT image reconstructions and MIPs were obtained to evaluate the vascular anatomy. Carotid stenosis measurements (when applicable) are obtained utilizing NASCET criteria, using the distal internal carotid diameter as the denominator. Multiphase CT imaging of the brain was performed following IV bolus contrast injection. Subsequent parametric perfusion maps were calculated using RAPID software. CONTRAST:  157mL OMNIPAQUE IOHEXOL 350 MG/ML SOLN COMPARISON:  Plain head CT today 0721 hours. [facility] brain MRI 02/15/2016 FINDINGS: CT Brain Perfusion Findings: ASPECTS: 6 CBF (<30%) Volume: 63mL (erroneous). Using CBF less than 38% 11 mL of parenchyma is detected which does partially correspond to the area of cytotoxic edema by plain CT. Perfusion (Tmax>6.0s) volume: 21mL, hypoperfusion index 0.1 but also might be spurrious. Mismatch Volume: Calculated is not accurate due to erroneously low infarct core, estimated penumbra given the above is 60 to 67 mL. Infarction Location:Left MCA The above was discussed by telephone with Dr. Lesleigh Noe on 06/30/2020 at 07:49 . CTA NECK Skeleton: No acute osseous abnormality identified. Dystrophic/degenerative calcifications of the longus coli muscle insertion at C1-C2. Upper chest: Negative. Other neck: No acute finding. Incidental contrast reflux into a left posterior neck vein. Aortic arch: Slightly bovine arch configuration. No arch atherosclerosis. Right carotid system: Mildly tortuous proximal right CCA. Moderate calcified plaque at the right ICA origin with less than 50 % stenosis with respect to the distal vessel. Mild tortuosity. Left carotid system: Patent, mildly tortuous left CCA. Mild to moderate calcified plaque  at the posterior left ICA origin with less than 50 % stenosis with respect to the distal vessel. Mildly tortuous. Vertebral arteries: Negative proximal right  subclavian artery and right vertebral artery origin. The right vertebral is mildly tortuous but patent to the skull base without stenosis. Moderate soft more than calcified plaque in the proximal left subclavian artery although no significant stenosis. Left vertebral artery origin remains normal. Codominant left vertebral artery is patent to the skull base without stenosis. CTA HEAD Posterior circulation: Mild right V4 calcified plaque. Patent distal vertebral arteries to the basilar with mild stenosis at the vertebrobasilar junction. Diminutive PICA. Left AICA appears dominant. Patent basilar artery is diminutive but without stenosis. Patent SCA origins with fetal type bilateral PCA origins. Patent basilar tip. Bilateral PCA branches are within normal limits. Anterior circulation: Both ICA siphons are patent. No significant siphon plaque or stenosis. Normal posterior communicating artery origins. Patent carotid termini although the left MCA origin is occluded (series 10, image 19). There is some left MCA branch reconstitution as seen on series 13, image 43. Left ACA origin is normal. There is severe stenosis at the right ACA origin. Anterior communicating artery is diminutive. Bilateral A2 branches appear symmetric and within normal limits. Right MCA origin is patent but also irregular with mild stenosis. Right MCA M1 and right MCA bifurcation are patent, although with moderate stenosis at the posterior right MCA M2 origin (series 12, image 10). No right MCA branch occlusion identified. Venous sinuses: Early contrast timing, grossly patent. Anatomic variants: Fetal type PCA origins. Review of the MIP images confirms the above findings IMPRESSION: 1. Positive for emergent large vessel occlusion: Left MCA origin. Some left MCA branch reconstitution. 2. CT Perfusion underestimates infarct core (ASPECTS 6), which is estimated at 11-22 mL, subsequent estimated penumbra of 60 to 67 mL. 3. No other large vessel occlusion.  But intracranial atherosclerosis with: - Severe stenosis Right ACA origin. - moderate stenosis Right MCA M2 branch origin. - mild stenosis Right MCA M1, also Vertebrobasilar junction. 4. Cervical carotid atherosclerosis without stenosis. #1 discussed by telephone with Dr. Lesleigh Noe on 06/30/2020 at 0732 hours. And Salient CTP findings discussed at 0749 hours. Electronically Signed   By: Genevie Ann M.D.   On: 06/30/2020 07:58   Portable Chest x-ray  Result Date: 06/30/2020 CLINICAL DATA:  Evaluate endotracheal tube placement EXAM: PORTABLE CHEST 1 VIEW COMPARISON:  09/17/2019 FINDINGS: The tip of the ET tube appears oriented towards the left mainstem bronchus. Consider withdrawing by 2 cm. The enteric tube tip is in the proximal stomach. The side port for the enteric tube is just above the GE junction. Heart size normal. No pleural effusion, interstitial edema or airspace consolidation. IMPRESSION: 1. ET tube tip appears oriented towards the left mainstem bronchus. Consider withdrawing by 2 cm. 2. Consider advancement of enteric tube. 3. Lungs appear clear. 4. These results will be called to the ordering clinician or representative by the Radiologist Assistant, and communication documented in the PACS or [employer]. Electronically Signed   By: Kerby Moors M.D.   On: 06/30/2020 13:59   CT HEAD CODE STROKE WO CONTRAST  Result Date: 06/30/2020 CLINICAL DATA:  Code stroke. 65 year old female with right side weakness, nonverbal. EXAM: CT HEAD WITHOUT CONTRAST TECHNIQUE: Contiguous axial images were obtained from the base of the skull through the vertex without intravenous contrast. COMPARISON:  [facility] brain MRI 02/15/2016 FINDINGS: Brain: No ventriculomegaly. No midline shift, mass effect, or evidence  of intracranial mass lesion. No acute intracranial hemorrhage identified. Asymmetric left MCA territory indistinct gray and white matter hypodensity compatible  with cytotoxic edema (series 2, image 15, 13). Left insula, left M2 and M3 segments affected. Left lentiform also asymmetric on series 2, image 16. Contralateral right hemisphere and posterior fossa gray-white matter differentiation is preserved. There is a small chronic appearing right cerebellar infarct which is new from 2017 (series 2, image 10). No acute intracranial hemorrhage identified. Vascular: Mild Calcified atherosclerosis at the skull base. No suspicious intracranial vascular hyperdensity. Skull: Negative. Sinuses/Orbits: 6 Visualized paranasal sinuses and mastoids are clear. Other: Leftward gaze deviation. Visualized scalp soft tissues are within normal limits. ASPECTS [facility] Stroke Program Early CT Score) - Ganglionic level infarction (caudate, lentiform nuclei, internal capsule, insula, M1-M3 cortex): 3 - Supraganglionic infarction (M4-M6 cortex): 3 Total score (0-10 with 10 being normal): 6 IMPRESSION: 1. Acute Left MCA territory infarct. ASPECTS 6. No hemorrhage or mass effect at this time. 2. The above discussed by telephone with Dr. Curly Shores on 06/30/2020 at 07:32 . 3. 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 Electronically Signed   By: Genevie Ann M.D.   On: 06/30/2020 07:34    Labs:  CBC: Recent Labs    07/08/19 1202 06/30/20 0710 06/30/20 0719 06/30/20 1405 07/01/20 0513  WBC 8.7 6.1  --   --  11.1*  HGB 12.7 11.5* 11.2* 9.9* 9.1*  HCT 37.9 36.3 33.0* 29.0* 27.1*  PLT 453* 507*  --   --  470*    COAGS: Recent Labs    06/30/20 0710  INR 1.1  APTT 25    BMP: Recent Labs    07/08/19 1202 06/30/20 0710 06/30/20 0719 06/30/20 1405 07/01/20 0513  NA 138 140 142 142 141  K 3.2* 3.5 3.6 3.4* 3.2*  CL 108 111 111  --  113*  CO2 23 20*  --   --  17*  GLUCOSE 121* 140* 135*  --  138*  BUN 16 14 17   --  13  CALCIUM 9.4 9.0  --   --  8.0*  CREATININE 1.59* 1.41* 1.30*  --  1.44*  GFRNONAA 34* 41*  --   --  40*  GFRAA 39*  --   --   --   --      LIVER FUNCTION TESTS: Recent Labs    07/08/19 1202 06/30/20 0710 07/01/20 0513  BILITOT 0.8 0.5 0.6  AST 18 21 17   ALT 14 15 11   ALKPHOS 86 101 82  PROT 7.2 6.4* 5.3*  ALBUMIN 3.8 3.4* 2.8*    Assessment and Plan:  History of acute CVA s/p cerebral arteriogram with emergent mechanical thrombectomy and stent placement of left MCA occlusion achieving a TICI 2C revascularization via right femoral approach 06/30/2020 by Dr. Estanislado Pandy. Patient's condition stable- intubated without sedation, awake and tearful, intermittently following simple commands, left gaze unable to cross midline, moving all extremities. Right femoral puncture site stable, distal pulses (DPs) 1+ bilaterally. Continue taking Brilinta 90 mg twice daily and Aspirin 81 mg once daily. For possible extubation and MRI/MRA today. Plan to follow-up with Dr. Estanislado Pandy with 3 month CTA head/neck (with contrast- NIR schedulers to call patient to set up this imaging scan). Further plans per neurology/CCM- appreciate and agree with management. NIR to follow.   Electronically Signed: Earley Abide, PA-C 07/01/2020, 9:41 AM   I spent a total of 25 Minutes at the the patient's bedside AND on the patient's hospital  floor or unit, greater than 50% of which was counseling/coordinating care for CVA s/p revascularization.

## 2020-07-01 NOTE — Progress Notes (Signed)
Bilateral lower extremity venous study completed.      Please see CV Proc for preliminary results.   Grae Leathers, RVT  

## 2020-07-01 NOTE — Discharge Instructions (Signed)
Femoral Site Care This sheet gives you information about how to care for yourself after your procedure. Your health care provider may also give you more specific instructions. If you have problems or questions, contact your health care provider. What can I expect after the procedure? After the procedure, it is common to have:  Bruising that usually fades within 1-2 weeks.  Tenderness at the site. Follow these instructions at home: Wound care 1. Follow instructions from your health care provider about how to take care of your insertion site. Make sure you: ? Wash your hands with soap and water before you change your bandage (dressing). If soap and water are not available, use hand sanitizer. ? Change your dressing as directed- pressure dressing removed 24 hours post-procedure (and switch for bandaid), bandaid removed 72 hours post-procedure 2. Do not take baths, swim, or use a hot tub for 7 days post-procedure. 3. You may shower 48 hours after the procedure or as told by your health care provider. ? Gently wash the site with plain soap and water. ? Pat the area dry with a clean towel. ? Do not rub the site. This may cause bleeding. 4. Check your site every day for signs of infection. Check for: ? Redness, swelling, or pain. ? Fluid or blood. ? Warmth. ? Pus or a bad smell. Activity  Do not stoop, bend, or lift anything that is heavier than 10 lb (4.5 kg) for 2 weeks post-procedure.  Do not drive self for 2 weeks post-procedure. Contact a health care provider if you have:  A fever or chills.  You have redness, swelling, or pain around your insertion site. Get help right away if:  The catheter insertion area swells very fast.  You pass out.  You suddenly start to sweat or your skin gets clammy.  The catheter insertion area is bleeding, and the bleeding does not stop when you hold steady pressure on the area.  The area near or just beyond the catheter insertion site becomes  pale, cool, tingly, or numb. These symptoms may represent a serious problem that is an emergency. Do not wait to see if the symptoms will go away. Get medical help right away. Call your local emergency services (911 in the [place] Do not drive yourself to the hospital.  This information is not intended to replace advice given to you by your health care provider. Make sure you discuss any questions you have with your health care provider. Document Revised: 06/03/2017 Document Reviewed: 06/03/2017 [employer] Patient Education  2020 [employer] 

## 2020-07-01 NOTE — Progress Notes (Signed)
Pt had an a episode after giving po medication. HR went into the 30's accompanied with vomiting. Notified [facility], orders for zofran.

## 2020-07-01 NOTE — Progress Notes (Signed)
EEG complete - results pending 

## 2020-07-01 NOTE — Progress Notes (Signed)
[facility] Progress Note Patient Name: Courtney Burke DOB: 1955/06/14 MRN: [id number]   Date of Service  07/01/2020  HPI/Events of Note  Patient with episode of vomiting associated ith a vaso-vagal response which resolved spontaneously.  eICU Interventions  PRN Zofran ordered.        Kerry Kass Sharnae Winfree 07/01/2020, 1:31 AM

## 2020-07-01 NOTE — Progress Notes (Signed)
STROKE TEAM PROGRESS NOTE   SUBJECTIVE (INTERVAL HISTORY) Her RN is at the bedside.  Overall her condition is gradually improving. Pt was extubated this am and tolerating well. 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 MRI and MRA pending. Did not pass swallow and will do cortrak and TF.    OBJECTIVE Temp:  [95.5 F (35.3 C)-98.4 F (36.9 C)] 98.4 F (36.9 C) (01/28 0800) Pulse Rate:  [66-102] 73 (01/28 1100) Cardiac Rhythm: Normal sinus rhythm (01/27 2000) Resp:  [15-25] 15 (01/28 1100) BP: (76-154)/(54-92) 117/64 (01/28 1100) SpO2:  [92 %-100 %] 100 % (01/28 1100) Arterial Line BP: (72-162)/(36-88) 126/53 (01/28 1100) FiO2 (%):  [40 %-100 %] 40 % (01/28 0822) Weight:  [93.9 kg] 93.9 kg (01/27 1415)  Recent Labs  Lab 06/30/20 1928 06/30/20 2325 07/01/20 0318 07/01/20 0750 07/01/20 1151  GLUCAP 148* 137* 117* 119* 118*   Recent Labs  Lab 06/30/20 0710 06/30/20 0719 06/30/20 1405 07/01/20 0513  NA 140 142 142 141  K 3.5 3.6 3.4* 3.2*  CL 111 111  --  113*  CO2 20*  --   --  17*  GLUCOSE 140* 135*  --  138*  BUN 14 17  --  13  CREATININE 1.41* 1.30*  --  1.44*  CALCIUM 9.0  --   --  8.0*   Recent Labs  Lab 06/30/20 0710 07/01/20 0513  AST 21 17  ALT 15 11  ALKPHOS 101 82  BILITOT 0.5 0.6  PROT 6.4* 5.3*  ALBUMIN 3.4* 2.8*   Recent Labs  Lab 06/30/20 0710 06/30/20 0719 06/30/20 1405 07/01/20 0513  WBC 6.1  --   --  11.1*  NEUTROABS 3.6  --   --  9.5*  HGB 11.5* 11.2* 9.9* 9.1*  HCT 36.3 33.0* 29.0* 27.1*  MCV 88.5  --   --  84.4  PLT 507*  --   --  470*   No results for input(s): CKTOTAL, CKMB, CKMBINDEX, TROPONINI in the last 168 hours. Recent Labs    06/30/20 0710  LABPROT 13.6  INR 1.1   No results for input(s): COLORURINE, LABSPEC, PHURINE, GLUCOSEU, HGBUR, BILIRUBINUR, KETONESUR, PROTEINUR, UROBILINOGEN, NITRITE, LEUKOCYTESUR in the last 72 hours.  Invalid input(s): APPERANCEUR     Component Value Date/Time   CHOL 143 07/01/2020  0513   TRIG 172 (H) 07/01/2020 0513   TRIG 164 (H) 07/01/2020 0513   HDL 41 07/01/2020 0513   CHOLHDL 3.5 07/01/2020 0513   VLDL 34 07/01/2020 0513   LDLCALC 68 07/01/2020 0513   Lab Results  Component Value Date   HGBA1C 5.4 07/01/2020   No results found for: LABOPIA, COCAINSCRNUR, LABBENZ, AMPHETMU, THCU, LABBARB  No results for input(s): ETH in the last 168 hours.  I have personally reviewed the radiological images below and agree with the radiology interpretations.  CT Code Stroke CTA Head W/WO contrast  Result Date: 06/30/2020 CLINICAL DATA:  65 year old female code stroke presentation with left MCA ASPECTS 6. Left gaze deviation. EXAM: CT ANGIOGRAPHY HEAD AND NECK CT PERFUSION BRAIN TECHNIQUE: Multidetector CT imaging of the head and neck was performed using the standard protocol during bolus administration of intravenous contrast. Multiplanar CT image reconstructions and MIPs were obtained to evaluate the vascular anatomy. Carotid stenosis measurements (when applicable) are obtained utilizing NASCET criteria, using the distal internal carotid diameter as the denominator. Multiphase CT imaging of the brain was performed following IV bolus contrast injection. Subsequent parametric perfusion maps were calculated using RAPID software. CONTRAST:  14mL OMNIPAQUE IOHEXOL 350 MG/ML SOLN COMPARISON:  Plain head CT today 0721 hours. [facility] brain MRI 02/15/2016 FINDINGS: CT Brain Perfusion Findings: ASPECTS: 6 CBF (<30%) Volume: 31mL (erroneous). Using CBF less than 38% 11 mL of parenchyma is detected which does partially correspond to the area of cytotoxic edema by plain CT. Perfusion (Tmax>6.0s) volume: 104mL, hypoperfusion index 0.1 but also might be spurrious. Mismatch Volume: Calculated is not accurate due to erroneously low infarct core, estimated penumbra given the above is 60 to 67 mL. Infarction Location:Left MCA The above was discussed by  telephone with Dr. Lesleigh Noe on 06/30/2020 at 07:49 . CTA NECK Skeleton: No acute osseous abnormality identified. Dystrophic/degenerative calcifications of the longus coli muscle insertion at C1-C2. Upper chest: Negative. Other neck: No acute finding. Incidental contrast reflux into a left posterior neck vein. Aortic arch: Slightly bovine arch configuration. No arch atherosclerosis. Right carotid system: Mildly tortuous proximal right CCA. Moderate calcified plaque at the right ICA origin with less than 50 % stenosis with respect to the distal vessel. Mild tortuosity. Left carotid system: Patent, mildly tortuous left CCA. Mild to moderate calcified plaque at the posterior left ICA origin with less than 50 % stenosis with respect to the distal vessel. Mildly tortuous. Vertebral arteries: Negative proximal right subclavian artery and right vertebral artery origin. The right vertebral is mildly tortuous but patent to the skull base without stenosis. Moderate soft more than calcified plaque in the proximal left subclavian artery although no significant stenosis. Left vertebral artery origin remains normal. Codominant left vertebral artery is patent to the skull base without stenosis. CTA HEAD Posterior circulation: Mild right V4 calcified plaque. Patent distal vertebral arteries to the basilar with mild stenosis at the vertebrobasilar junction. Diminutive PICA. Left AICA appears dominant. Patent basilar artery is diminutive but without stenosis. Patent SCA origins with fetal type bilateral PCA origins. Patent basilar tip. Bilateral PCA branches are within normal limits. Anterior circulation: Both ICA siphons are patent. No significant siphon plaque or stenosis. Normal posterior communicating artery origins. Patent carotid termini although the left MCA origin is occluded (series 10, image 19). There is some left MCA branch reconstitution as seen on series 13, image 43. Left ACA origin is normal. There is severe  stenosis at the right ACA origin. Anterior communicating artery is diminutive. Bilateral A2 branches appear symmetric and within normal limits. Right MCA origin is patent but also irregular with mild stenosis. Right MCA M1 and right MCA bifurcation are patent, although with moderate stenosis at the posterior right MCA M2 origin (series 12, image 10). No right MCA branch occlusion identified. Venous sinuses: Early contrast timing, grossly patent. Anatomic variants: Fetal type PCA origins. Review of the MIP images confirms the above findings IMPRESSION: 1. Positive for emergent large vessel occlusion: Left MCA origin. Some left MCA branch reconstitution. 2. CT Perfusion underestimates infarct core (ASPECTS 6), which is estimated at 11-22 mL, subsequent estimated penumbra of 60 to 67 mL. 3. No other large vessel occlusion. But intracranial atherosclerosis with: - Severe stenosis Right ACA origin. - moderate stenosis Right MCA M2 branch origin. - mild stenosis Right MCA M1, also Vertebrobasilar junction. 4. Cervical carotid atherosclerosis without stenosis. #1 discussed by telephone with Dr. Lesleigh Noe on 06/30/2020 at 0732 hours. And Salient CTP findings discussed at 0749 hours. Electronically Signed   By: Genevie Ann M.D.   On: 06/30/2020 07:58   CT HEAD WO CONTRAST  Result Date: 06/30/2020 CLINICAL DATA:  Stroke, left MCA occlusion post thrombectomy and stenting EXAM: CT HEAD WITHOUT CONTRAST TECHNIQUE: Contiguous axial images were obtained from the base of the skull through the vertex without intravenous contrast. COMPARISON:  Earlier same day FINDINGS: Brain: Probable few patchy areas of loss of gray-white differentiation are identified in the left MCA territory. This is less apparent than on the prior study. There is some ill-defined patchy hyperdensity, most discrete in the superior left temporal lobe on axial series 3, image 14. There is no significant mass effect. No hydrocephalus. Small chronic right  cerebellar infarct. Vascular: New stent of the left M1 MCA. Skull: Calvarium is unremarkable. Sinuses/Orbits: No acute finding. Other: None. IMPRESSION: Probable few small areas of acute left MCA infarction as before but less apparent. Ill-defined patchy hyperdensity may reflect contrast staining and/or reperfusion petechial hemorrhage. No significant mass effect. Electronically Signed   By: Macy Mis M.D.   On: 06/30/2020 16:41   CT Code Stroke CTA Neck W/WO contrast  Result Date: 06/30/2020 CLINICAL DATA:  65 year old female code stroke presentation with left MCA ASPECTS 6. Left gaze deviation. EXAM: CT ANGIOGRAPHY HEAD AND NECK CT PERFUSION BRAIN TECHNIQUE: Multidetector CT imaging of the head and neck was performed using the standard protocol during bolus administration of intravenous contrast. Multiplanar CT image reconstructions and MIPs were obtained to evaluate the vascular anatomy. Carotid stenosis measurements (when applicable) are obtained utilizing NASCET criteria, using the distal internal carotid diameter as the denominator. Multiphase CT imaging of the brain was performed following IV bolus contrast injection. Subsequent parametric perfusion maps were calculated using RAPID software. CONTRAST:  158mL OMNIPAQUE IOHEXOL 350 MG/ML SOLN COMPARISON:  Plain head CT today 0721 hours. [facility] brain MRI 02/15/2016 FINDINGS: CT Brain Perfusion Findings: ASPECTS: 6 CBF (<30%) Volume: 51mL (erroneous). Using CBF less than 38% 11 mL of parenchyma is detected which does partially correspond to the area of cytotoxic edema by plain CT. Perfusion (Tmax>6.0s) volume: 75mL, hypoperfusion index 0.1 but also might be spurrious. Mismatch Volume: Calculated is not accurate due to erroneously low infarct core, estimated penumbra given the above is 60 to 67 mL. Infarction Location:Left MCA The above was discussed by telephone with Dr. Lesleigh Noe on 06/30/2020 at 07:49 .  CTA NECK Skeleton: No acute osseous abnormality identified. Dystrophic/degenerative calcifications of the longus coli muscle insertion at C1-C2. Upper chest: Negative. Other neck: No acute finding. Incidental contrast reflux into a left posterior neck vein. Aortic arch: Slightly bovine arch configuration. No arch atherosclerosis. Right carotid system: Mildly tortuous proximal right CCA. Moderate calcified plaque at the right ICA origin with less than 50 % stenosis with respect to the distal vessel. Mild tortuosity. Left carotid system: Patent, mildly tortuous left CCA. Mild to moderate calcified plaque at the posterior left ICA origin with less than 50 % stenosis with respect to the distal vessel. Mildly tortuous. Vertebral arteries: Negative proximal right subclavian artery and right vertebral artery origin. The right vertebral is mildly tortuous but patent to the skull base without stenosis. Moderate soft more than calcified plaque in the proximal left subclavian artery although no significant stenosis. Left vertebral artery origin remains normal. Codominant left vertebral artery is patent to the skull base without stenosis. CTA HEAD Posterior circulation: Mild right V4 calcified plaque. Patent distal vertebral arteries to the basilar with mild stenosis at the vertebrobasilar junction. Diminutive PICA. Left AICA appears dominant. Patent basilar artery is diminutive but without stenosis. Patent SCA origins with fetal type bilateral PCA  origins. Patent basilar tip. Bilateral PCA branches are within normal limits. Anterior circulation: Both ICA siphons are patent. No significant siphon plaque or stenosis. Normal posterior communicating artery origins. Patent carotid termini although the left MCA origin is occluded (series 10, image 19). There is some left MCA branch reconstitution as seen on series 13, image 43. Left ACA origin is normal. There is severe stenosis at the right ACA origin. Anterior communicating artery  is diminutive. Bilateral A2 branches appear symmetric and within normal limits. Right MCA origin is patent but also irregular with mild stenosis. Right MCA M1 and right MCA bifurcation are patent, although with moderate stenosis at the posterior right MCA M2 origin (series 12, image 10). No right MCA branch occlusion identified. Venous sinuses: Early contrast timing, grossly patent. Anatomic variants: Fetal type PCA origins. Review of the MIP images confirms the above findings IMPRESSION: 1. Positive for emergent large vessel occlusion: Left MCA origin. Some left MCA branch reconstitution. 2. CT Perfusion underestimates infarct core (ASPECTS 6), which is estimated at 11-22 mL, subsequent estimated penumbra of 60 to 67 mL. 3. No other large vessel occlusion. But intracranial atherosclerosis with: - Severe stenosis Right ACA origin. - moderate stenosis Right MCA M2 branch origin. - mild stenosis Right MCA M1, also Vertebrobasilar junction. 4. Cervical carotid atherosclerosis without stenosis. #1 discussed by telephone with Dr. Lesleigh Noe on 06/30/2020 at 0732 hours. And Salient CTP findings discussed at 0749 hours. Electronically Signed   By: Genevie Ann M.D.   On: 06/30/2020 07:58   CT Code Stroke Cerebral Perfusion with contrast  Result Date: 06/30/2020 CLINICAL DATA:  65 year old female code stroke presentation with left MCA ASPECTS 6. Left gaze deviation. EXAM: CT ANGIOGRAPHY HEAD AND NECK CT PERFUSION BRAIN TECHNIQUE: Multidetector CT imaging of the head and neck was performed using the standard protocol during bolus administration of intravenous contrast. Multiplanar CT image reconstructions and MIPs were obtained to evaluate the vascular anatomy. Carotid stenosis measurements (when applicable) are obtained utilizing NASCET criteria, using the distal internal carotid diameter as the denominator. Multiphase CT imaging of the brain was performed following IV bolus contrast injection. Subsequent parametric  perfusion maps were calculated using RAPID software. CONTRAST:  125mL OMNIPAQUE IOHEXOL 350 MG/ML SOLN COMPARISON:  Plain head CT today 0721 hours. [facility] brain MRI 02/15/2016 FINDINGS: CT Brain Perfusion Findings: ASPECTS: 6 CBF (<30%) Volume: 44mL (erroneous). Using CBF less than 38% 11 mL of parenchyma is detected which does partially correspond to the area of cytotoxic edema by plain CT. Perfusion (Tmax>6.0s) volume: 61mL, hypoperfusion index 0.1 but also might be spurrious. Mismatch Volume: Calculated is not accurate due to erroneously low infarct core, estimated penumbra given the above is 60 to 67 mL. Infarction Location:Left MCA The above was discussed by telephone with Dr. Lesleigh Noe on 06/30/2020 at 07:49 . CTA NECK Skeleton: No acute osseous abnormality identified. Dystrophic/degenerative calcifications of the longus coli muscle insertion at C1-C2. Upper chest: Negative. Other neck: No acute finding. Incidental contrast reflux into a left posterior neck vein. Aortic arch: Slightly bovine arch configuration. No arch atherosclerosis. Right carotid system: Mildly tortuous proximal right CCA. Moderate calcified plaque at the right ICA origin with less than 50 % stenosis with respect to the distal vessel. Mild tortuosity. Left carotid system: Patent, mildly tortuous left CCA. Mild to moderate calcified plaque at the posterior left ICA origin with less than 50 % stenosis with respect to the distal vessel. Mildly tortuous. Vertebral arteries: Negative  proximal right subclavian artery and right vertebral artery origin. The right vertebral is mildly tortuous but patent to the skull base without stenosis. Moderate soft more than calcified plaque in the proximal left subclavian artery although no significant stenosis. Left vertebral artery origin remains normal. Codominant left vertebral artery is patent to the skull base without stenosis. CTA HEAD Posterior  circulation: Mild right V4 calcified plaque. Patent distal vertebral arteries to the basilar with mild stenosis at the vertebrobasilar junction. Diminutive PICA. Left AICA appears dominant. Patent basilar artery is diminutive but without stenosis. Patent SCA origins with fetal type bilateral PCA origins. Patent basilar tip. Bilateral PCA branches are within normal limits. Anterior circulation: Both ICA siphons are patent. No significant siphon plaque or stenosis. Normal posterior communicating artery origins. Patent carotid termini although the left MCA origin is occluded (series 10, image 19). There is some left MCA branch reconstitution as seen on series 13, image 43. Left ACA origin is normal. There is severe stenosis at the right ACA origin. Anterior communicating artery is diminutive. Bilateral A2 branches appear symmetric and within normal limits. Right MCA origin is patent but also irregular with mild stenosis. Right MCA M1 and right MCA bifurcation are patent, although with moderate stenosis at the posterior right MCA M2 origin (series 12, image 10). No right MCA branch occlusion identified. Venous sinuses: Early contrast timing, grossly patent. Anatomic variants: Fetal type PCA origins. Review of the MIP images confirms the above findings IMPRESSION: 1. Positive for emergent large vessel occlusion: Left MCA origin. Some left MCA branch reconstitution. 2. CT Perfusion underestimates infarct core (ASPECTS 6), which is estimated at 11-22 mL, subsequent estimated penumbra of 60 to 67 mL. 3. No other large vessel occlusion. But intracranial atherosclerosis with: - Severe stenosis Right ACA origin. - moderate stenosis Right MCA M2 branch origin. - mild stenosis Right MCA M1, also Vertebrobasilar junction. 4. Cervical carotid atherosclerosis without stenosis. #1 discussed by telephone with Dr. Lesleigh Noe on 06/30/2020 at 0732 hours. And Salient CTP findings discussed at 0749 hours. Electronically Signed   By: Genevie Ann M.D.   On: 06/30/2020 07:58   Portable Chest x-ray  Result Date: 06/30/2020 CLINICAL DATA:  Evaluate endotracheal tube placement EXAM: PORTABLE CHEST 1 VIEW COMPARISON:  09/17/2019 FINDINGS: The tip of the ET tube appears oriented towards the left mainstem bronchus. Consider withdrawing by 2 cm. The enteric tube tip is in the proximal stomach. The side port for the enteric tube is just above the GE junction. Heart size normal. No pleural effusion, interstitial edema or airspace consolidation. IMPRESSION: 1. ET tube tip appears oriented towards the left mainstem bronchus. Consider withdrawing by 2 cm. 2. Consider advancement of enteric tube. 3. Lungs appear clear. 4. These results will be called to the ordering clinician or representative by the Radiologist Assistant, and communication documented in the PACS or [employer]. Electronically Signed   By: Kerby Moors M.D.   On: 06/30/2020 13:59   EEG adult  Result Date: 07/01/2020 Lora Havens, MD     07/01/2020 10:52 AM Patient Name: Nitisha Fatica MRN: [id number] Epilepsy Attending: Lora Havens Referring Physician/Provider: Dr. Rosalin Hawking Date: 07/01/2020 Duration: 24.04 mins Patient history: 65 year old female with recent left MCA stroke status post thrombectomy. EEG to evaluate for seizures. Level of alertness: Awake AEDs during EEG study: None Technical aspects: This EEG study was done with scalp electrodes positioned according to the 10-20 International system of electrode placement. Electrical activity was acquired at a sampling rate  of 500Hz  and reviewed with a high frequency filter of 70Hz  and a low frequency filter of 1Hz . EEG data were recorded continuously and digitally stored. Description: The posterior dominant rhythm consists of 8 Hz activity of moderate voltage (25-35 uV) seen predominantly in posterior head regions, symmetric and reactive to eye opening and eye closing.  EEG showed intermittent generalized and maximal left  frontal region 3 to 5 Hz theta-delta slowing.  The 3 to 5 Hz theta and delta slowing in the left frontal region appeared sharply contoured and waxes and wanes without definite evolution.  Hyperventilation and photic stimulation were not performed.   ABNORMALITY -Frontal intermittent rhythmic delta activity, left frontal region -Intermittent slow, generalized and maximal left frontal region IMPRESSION: This study is suggestive of cortical dysfunction in left frontal region likely secondary underlying stroke as well as mild diffuse encephalopathy, nonspecific etiology.  Intermittent rhythmic delta activity in left frontal region is on the ictal-interictal continuum with low to intermediate potential for seizures.  No seizures or epileptiform discharges were seen throughout the recording. Lora Havens   CT HEAD CODE STROKE WO CONTRAST  Result Date: 06/30/2020 CLINICAL DATA:  Code stroke. 65 year old female with right side weakness, nonverbal. EXAM: CT HEAD WITHOUT CONTRAST TECHNIQUE: Contiguous axial images were obtained from the base of the skull through the vertex without intravenous contrast. COMPARISON:  [facility] brain MRI 02/15/2016 FINDINGS: Brain: No ventriculomegaly. No midline shift, mass effect, or evidence of intracranial mass lesion. No acute intracranial hemorrhage identified. Asymmetric left MCA territory indistinct gray and white matter hypodensity compatible with cytotoxic edema (series 2, image 15, 13). Left insula, left M2 and M3 segments affected. Left lentiform also asymmetric on series 2, image 16. Contralateral right hemisphere and posterior fossa gray-white matter differentiation is preserved. There is a small chronic appearing right cerebellar infarct which is new from 2017 (series 2, image 10). No acute intracranial hemorrhage identified. Vascular: Mild Calcified atherosclerosis at the skull base. No suspicious intracranial vascular  hyperdensity. Skull: Negative. Sinuses/Orbits: 6 Visualized paranasal sinuses and mastoids are clear. Other: Leftward gaze deviation. Visualized scalp soft tissues are within normal limits. ASPECTS [facility] Stroke Program Early CT Score) - Ganglionic level infarction (caudate, lentiform nuclei, internal capsule, insula, M1-M3 cortex): 3 - Supraganglionic infarction (M4-M6 cortex): 3 Total score (0-10 with 10 being normal): 6 IMPRESSION: 1. Acute Left MCA territory infarct. ASPECTS 6. No hemorrhage or mass effect at this time. 2. The above discussed by telephone with Dr. Curly Shores on 06/30/2020 at 07:32 . 3. 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 Electronically Signed   By: Genevie Ann M.D.   On: 06/30/2020 07:34    PHYSICAL EXAM  Temp:  [95.5 F (35.3 C)-98.4 F (36.9 C)] 98.4 F (36.9 C) (01/28 0800) Pulse Rate:  [66-102] 73 (01/28 1100) Resp:  [15-25] 15 (01/28 1100) BP: (76-154)/(54-92) 117/64 (01/28 1100) SpO2:  [92 %-100 %] 100 % (01/28 1100) Arterial Line BP: (72-162)/(36-88) 126/53 (01/28 1100) FiO2 (%):  [40 %-100 %] 40 % (01/28 0822) Weight:  [93.9 kg] 93.9 kg (01/27 1415)  General - Well nourished, well developed, in no apparent distress.  Ophthalmologic - fundi not visualized due to noncooperation.  Cardiovascular - Regular rhythm and rate.  Neuro - awake, eyes open, however, global aphasia, no speech output and not following commands, not able to name and repeat. Left gaze preference but able to gaze to right, tracking bilaterally, however, blinking to visual threat  on the left but not on the right. Mild right facial droop. Tongue protrusion not cooperative. LUE no drift but RUE drift but not hit bed within 10 sec. However, seems equal strength BLEs, 3/5 proximal and distally. Sensation, coordination not cooperative and gait not tested.   ASSESSMENT/PLAN Ms. Oriane Wielgus is a 65 y.o. female with history of hypertension, diabetes, CKD, obesity status post  gastric bypass in 05/2020 admitted for confusion, seizure-like activity, drooling, bowel incontinence, and left gaze and right-sided weakness. No tPA given due to outside window.  Status post IR for left MCA occlusion.  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  Aspects 6  CT head and neck left MCA origin occlusion with some left MCA branch reconstitution.  Severe stenosis right ICA origin, moderate stenosis right M2 origin, mild stenosis right M1 and VB junction  MRI pending  MRA pending  2D Echo EF 60 to 65%  LE venous Doppler Negative for DVT  Recommend loop recorder to rule out A. fib on discharge  LDL 68  HgbA1c 5.4  Lovenox for VTE prophylaxis  aspirin 81 mg daily prior to admission, now on aspirin 81 mg daily and Brilinta (ticagrelor) 90 mg bid.   Ongoing aggressive stroke risk factor management  Therapy recommendations: CIR  Disposition: Pending  Seizure like activity  At home with eyes rolling back, unresponsiveness, bowel incontinence, moaning and drooling  EEG Intermittent rhythmic delta activity in left frontal region is on the ictal-interictal continuum with low to intermediate potential for seizures.  We will put on Keppra 500 twice daily  Diabetes  HgbA1c 5.4 goal < 7.0  Controlled  CBG monitoring  SSI  DM education and close PCP follow up  Hypertension . Stable . BP 120-140 within 24 h of IR  Long term BP goal normotensive  Hyperlipidemia  Home meds: Lipitor 40  LDL 68, goal < 70  Now on Lipitor 80  Continue statin at discharge  Dysphagia  Did not pass a swallow  Keep n.p.o.  Status post core track  IV fluid and tube feeding  Other Stroke Risk Factors  Advanced age  Obesity, Body mass index is 37.86 kg/m.  Status post gastric bypass  in 05/2020  Other Active Problems  CKD creatinine 1.3-> 1.44 - IV fluid and tube feeding  Hospital day # 1  This patient is critically ill due to large left MCA infarct stat post IR and stenting, seizure-like activity, dysphagia and at significant risk of neurological worsening, death form recurrent stroke, hemorrhagic conversion, status epilepticus, aspiration. This patient's care requires constant monitoring of vital signs, hemodynamics, respiratory and cardiac monitoring, review of multiple databases, neurological assessment, discussion with family, other specialists and medical decision making of high complexity. I spent 40 minutes of neurocritical care time in the care of this patient.   Rosalin Hawking, MD PhD Stroke Neurology 07/01/2020 12:48 PM    To contact Stroke Continuity provider, please refer to [email or web address] After hours, contact General Neurology

## 2020-07-01 NOTE — Progress Notes (Signed)
  Echocardiogram 2D Echocardiogram has been performed.  Jennette Dubin 07/01/2020, 11:06 AM

## 2020-07-01 NOTE — Progress Notes (Signed)
[facility] Progress Note Patient Name: Courtney Burke DOB: 08-01-1955 MRN: [id number]   Date of Service  07/01/2020  HPI/Events of Note  K+ 3.2  eICU Interventions  Adult electrolyte replacement protocol for K+ ordered.        Frederik Pear 07/01/2020, 6:46 AM

## 2020-07-01 NOTE — Evaluation (Signed)
Occupational Therapy Evaluation Patient Details Name: Courtney Burke MRN: [id number] DOB: [birth year] Today's Date: 07/01/2020    History of Present Illness 65 y.o. female with a PMHx of HTN, DM, CKD III, asthma, Vit D deficiency, obesity s/p gastric sleeve 12/21 who [facility] as a CODE STROKE with AMS and aphasia. Pt's brother also reports multiple periods of LOC. In ED pt demonstrates L gaze and R hemiparesis. CTA demonstrates L M1 occlusion. Pt underwent L common carotid arteriogram with revascularization of L MCA on 06/30/2020.   Clinical Impression   PT admitted with L MCA with revascularization. Pt currently with functional limitiations due to the deficits listed below (see OT problem list). Pt currently with expressive and receptive aphasia. Pt does not follow commands during session but with visual input able to progress to chair total +2 mod (A). Pt moving all 4 extremities but not to command. Pt with decrease sensation on R side noted.  Pt will benefit from skilled OT to increase their independence and safety with adls and balance to allow discharge CIR.     Follow Up Recommendations  CIR    Equipment Recommendations  3 in 1 bedside commode;Wheelchair (measurements OT);Wheelchair cushion (measurements OT);Hospital bed    Recommendations for Other Services Rehab consult     Precautions / Restrictions Precautions Precautions: Fall Restrictions Weight Bearing Restrictions: No      Mobility Bed Mobility Overal bed mobility: Needs Assistance Bed Mobility: Rolling;Sidelying to Sit Rolling: Max assist Sidelying to sit: Max assist       General bed mobility comments: pt is able to pull through LUE to elevate trunk, otherwise requires significant assistance for LE management and use of pad to rotate hips    Transfers Overall transfer level: Needs assistance Equipment used: 2 person hand held assist Transfers: Sit to/from [employer] Transfers Sit to  Stand: Mod assist;+2 physical assistance Stand pivot transfers: Mod assist;+2 physical assistance       General transfer comment: pt requires initiation of sit to stand and tacilte cues/weight shift to initiate step and turn to recliner    Balance Overall balance assessment: Needs assistance Sitting-balance support: Single extremity supported;Bilateral upper extremity supported;Feet unsupported Sitting balance-Leahy Scale: Poor Sitting balance - Comments: minA   Standing balance support: Bilateral upper extremity supported Standing balance-Leahy Scale: Poor Standing balance comment: minA x2 hand hold                           ADL either performed or assessed with clinical judgement   ADL Overall ADL's : Needs assistance/impaired Eating/Feeding: NPO Eating/Feeding Details (indicate cue type and reason): pt noted to be choking on secretions sitting in bed. pt with tearful eyes nose slightly wet and drainage from mouth Grooming: Total assistance   Upper Body Bathing: Cueing for sequencing   Lower Body Bathing: Cueing for safety           Toilet Transfer: +2 for physical assistance;Moderate assistance Toilet Transfer Details (indicate cue type and reason): simulated EOB to chair           General ADL Comments: pt progressing with visual cues and automatic responses to functional task. pt does not verbalize to thearpist during session. pt closing L eye with visual tracking     Vision   Vision Assessment?: Yes Eye Alignment: Impaired (comment) Ocular Range of Motion: Impaired-to be further tested in functional context Tracking/Visual Pursuits: Impaired - to be further tested in functional  context Additional Comments: pt noted to close L eye with tracking L to right . OT walking around the bed to get patient to track and pt following therapist visually with head movement. pt noted to have decreased movement L eye with this task . due to cognition difficult to have  complete eye assessment     Perception     Praxis      Pertinent Vitals/Pain Pain Assessment: Faces Faces Pain Scale: Hurts little more Pain Location: RUE with painful timuli Pain Descriptors / Indicators: Grimacing Pain Intervention(s): Monitored during session;Repositioned     Hand Dominance  (unknown)   Extremity/Trunk Assessment Upper Extremity Assessment Upper Extremity Assessment: Generalized weakness;Difficult to assess due to impaired cognition (pt demonstrates abiity to move both but not to command)   Lower Extremity Assessment Lower Extremity Assessment: RLE deficits/detail;LLE deficits/detail RLE Deficits / Details: pt demonstrates at least 3/5 strength, difficult to formally assess 2/2 communication deficits LLE Deficits / Details: at least 4-/5 based on observed mobility, difficult to formally assess   Cervical / Trunk Assessment Cervical / Trunk Assessment: Normal   Communication Communication Communication: Receptive difficulties;Expressive difficulties   Cognition Arousal/Alertness: Awake/alert Behavior During Therapy: Flat affect Overall Cognitive Status: Difficult to assess                                 General Comments: pt with expressive and receptive aphasia, does not follow verbal commands and inconsistently follows visual cues. Pt is able to repeat mobility with tactile cueing and therapist initiation.   General Comments  VSS on [place] 4 L    Exercises     Shoulder Instructions      Home Living Family/patient expects to be discharged to:: Unsure                                 Additional Comments: pt with expressive and receptive aphasia, unable to report history, no family present at the time of evaluation      Prior Functioning/Environment          Comments: unable to determine as no family present and pt is aphasic. Based on documentation it seems the pt was able to drive her brothers car so PT assumes the pt  is fairly independent.        OT Problem List: Decreased strength;Decreased activity tolerance;Impaired balance (sitting and/or standing);Decreased cognition;Decreased safety awareness;Decreased knowledge of use of DME or AE;Decreased knowledge of precautions;Cardiopulmonary status limiting activity;Impaired sensation;Obesity;Impaired UE functional use;Decreased coordination;Impaired vision/perception      OT Treatment/Interventions: Self-care/ADL training;Therapeutic exercise;Neuromuscular education;Energy conservation;DME and/or AE instruction;Manual therapy;Modalities;Splinting;Therapeutic activities;Cognitive remediation/compensation;Visual/perceptual remediation/compensation;Patient/family education;Balance training    OT Goals(Current goals can be found in the care plan section) Acute Rehab OT Goals Patient Stated Goal: unable to state OT Goal Formulation: Patient unable to participate in goal setting Time For Goal Achievement: 07/15/20 Potential to Achieve Goals: Good  OT Frequency: Min 2X/week   Barriers to D/C:            Co-evaluation PT/OT/SLP Co-Evaluation/Treatment: Yes Reason for Co-Treatment: Complexity of the patient's impairments (multi-system involvement);Necessary to address cognition/behavior during functional activity;For patient/therapist safety;To address functional/ADL transfers PT goals addressed during session: Mobility/safety with mobility;Balance;Strengthening/ROM OT goals addressed during session: ADL's and self-care;Proper use of Adaptive equipment and DME;Strengthening/ROM      AM-PAC OT "6 Clicks" Daily Activity     Outcome Measure  Help from another person eating meals?: Total Help from another person taking care of personal grooming?: Total Help from another person toileting, which includes using toliet, bedpan, or urinal?: Total Help from another person bathing (including washing, rinsing, drying)?: Total Help from another person to put on and  taking off regular upper body clothing?: Total Help from another person to put on and taking off regular lower body clothing?: Total 6 Click Score: 6   End of Session Equipment Utilized During Treatment: Oxygen Nurse Communication: Mobility status;Precautions  Activity Tolerance: Patient tolerated treatment well Patient left: in chair;with call bell/phone within reach;with chair alarm set  OT Visit Diagnosis: Unsteadiness on feet (R26.81);Muscle weakness (generalized) (M62.81)                Time: 7035-0093 OT Time Calculation (min): 23 min Charges:  OT General Charges $OT Visit: 1 Visit   Fleeta Emmer, OTR/L  Acute Rehabilitation Services Pager: [phone] Office: [phone] .   Jeri Modena 07/01/2020, 4:12 PM

## 2020-07-01 NOTE — Progress Notes (Signed)
Per RN PIV not needed right now, reorder if required.

## 2020-07-01 NOTE — Procedures (Signed)
Cortrak  Person Inserting Tube:  Noralee Dutko, Creola Corn, RD Tube Type:  Cortrak - 43 inches Tube Location:  Right nare Initial Placement:  Stomach Secured by: Bridle Technique Used to Measure Tube Placement:  Documented cm marking at nare/ corner of mouth Cortrak Secured At:  65 cm    Cortrak Tube Team Note:  Consult received to place a Cortrak feeding tube.     X-ray is required, abdominal x-ray has been ordered by the Cortrak team. Please confirm tube placement before using the Cortrak tube.   If the tube becomes dislodged please keep the tube and contact the Cortrak team at [email or web address] (password [email or web address]) for replacement.  If after hours and replacement cannot be delayed, place a NG tube and confirm placement with an abdominal x-ray.    Larkin Ina, MS, RD, LDN RD pager number and weekend/on-call pager number located in [place].

## 2020-07-01 NOTE — Procedures (Signed)
Extubation Procedure Note  Patient Details:   Name: Courtney Burke DOB: 1955/12/19 MRN: [id number]   Airway Documentation:    Vent end date: 07/01/20 Vent end time: 0918   Evaluation  O2 sats: stable throughout Complications: No apparent complications Patient did tolerate procedure well. Bilateral Breath Sounds: Rhonchi   Pt extubated to 4L  per MD order. Pt had positive cuff leak prior to extubation. No stridor noted.   Vilinda Blanks 07/01/2020, 9:19 AM

## 2020-07-01 NOTE — Progress Notes (Signed)
Initial Nutrition Assessment  DOCUMENTATION CODES:   Not applicable  INTERVENTION:   Initiate tube feeding via Cortrak tube: Osmolite 1.2 at 25 ml/h increase by 10 ml every 4 hours to goal rate of 55 ml/hr (1320 ml per day) Prosource TF 45 ml BID  Provides 1664 kcal, 95 gm protein, 1070 ml free water daily  MVI with minerals daily   NUTRITION DIAGNOSIS:   Inadequate oral intake related to inability to eat as evidenced by NPO status.  GOAL:   Patient will meet greater than or equal to 90% of their needs  MONITOR:   TF tolerance  REASON FOR ASSESSMENT:   Consult Enteral/tube feeding initiation and management  ASSESSMENT:   Pt with PMH of HTN, DM, CKD III, vitamin D deficiency, obesity s/p gastric sleeve 12/21 admitted with L MCA s/p revascularization of occluded L MCA with stent.   Pt discussed during ICU rounds and with RN.  Pt with expressive aphasia. Spoke with pt's daughter who reports that pt had a vertical sleeve gastrectomy in early 12/21. She is not aware that her mom was taking vitamins and is unable to clarify pt's typical PO intake PTA.   1/27 s/p thrombectomy and stent placement  1/28 extubated; failed swallow; cortrak placed tip gastric per radiologist but xray does not mention her recent vertical sleeve gastrectomy.   Medications reviewed and include: colace, SSI, miralax, senokot-s Precedex  Labs reviewed: K+ 3.2  CBG's: 119-118  NUTRITION - FOCUSED PHYSICAL EXAM:  Flowsheet Row Most Recent Value  Orbital Region No depletion  Upper Arm Region No depletion  Thoracic and Lumbar Region No depletion  Buccal Region No depletion  Temple Region No depletion  Clavicle Bone Region No depletion  Clavicle and Acromion Bone Region No depletion  Scapular Bone Region No depletion  Dorsal Hand No depletion  Patellar Region No depletion  Anterior Thigh Region No depletion  Posterior Calf Region No depletion  Edema (RD Assessment) None  Hair Reviewed  Eyes  Reviewed  Mouth Reviewed  Skin Reviewed  Nails Reviewed       Diet Order:   Diet Order            Diet NPO time specified  Diet effective now                 EDUCATION NEEDS:      Skin:  Skin Assessment: Reviewed RN Assessment  Last BM:  unknown  Height:   Ht Readings from Last 1 Encounters:  06/30/20 5\' 2"  (1.575 m)    Weight:   Wt Readings from Last 1 Encounters:  06/30/20 93.9 kg    Ideal Body Weight:  50 kg  BMI:  Body mass index is 37.86 kg/m.  Estimated Nutritional Needs:   Kcal:  1600-1800  Protein:  90-110 grams  Fluid:  > 1.6 L/day  Lockie Pares., RD, LDN, CNSC See AMiON for contact information

## 2020-07-01 NOTE — Procedures (Signed)
Patient Name: Kenosha Doster  MRN: [id number]  Epilepsy Attending: Lora Havens  Referring Physician/Provider: Dr. Rosalin Hawking Date: 07/01/2020 Duration: 24.04 mins  Patient history: 65 year old female with recent left MCA stroke status post thrombectomy. EEG to evaluate for seizures.  Level of alertness: Awake  AEDs during EEG study: None  Technical aspects: This EEG study was done with scalp electrodes positioned according to the 10-20 International system of electrode placement. Electrical activity was acquired at a sampling rate of 500Hz  and reviewed with a high frequency filter of 70Hz  and a low frequency filter of 1Hz . EEG data were recorded continuously and digitally stored.   Description: The posterior dominant rhythm consists of 8 Hz activity of moderate voltage (25-35 uV) seen predominantly in posterior head regions, symmetric and reactive to eye opening and eye closing.  EEG showed intermittent generalized and maximal left frontal region 3 to 5 Hz theta-delta slowing.  The 3 to 5 Hz theta and delta slowing in the left frontal region appeared sharply contoured and waxes and wanes without definite evolution.  Hyperventilation and photic stimulation were not performed.     ABNORMALITY -Frontal intermittent rhythmic delta activity, left frontal region -Intermittent slow, generalized and maximal left frontal region  IMPRESSION: This study is suggestive of cortical dysfunction in left frontal region likely secondary underlying stroke as well as mild diffuse encephalopathy, nonspecific etiology.  Intermittent rhythmic delta activity in left frontal region is on the ictal-interictal continuum with low to intermediate potential for seizures.  No seizures or epileptiform discharges were seen throughout the recording.  Khori Rosevear Barbra Sarks

## 2020-07-01 NOTE — Evaluation (Signed)
Physical Therapy Evaluation Patient Details Name: Courtney Burke MRN: [id number] DOB: 1955-08-27 Today's Date: 07/01/2020   History of Present Illness  65 y.o. female with a PMHx of HTN, DM, CKD III, asthma, Vit D deficiency, obesity s/p gastric sleeve 12/21 who [facility] as a CODE STROKE with AMS and aphasia. Pt's brother also reports multiple periods of LOC. In ED pt demonstrates L gaze and R hemiparesis. CTA demonstrates L M1 occlusion. Pt underwent L common carotid arteriogram with revascularization of L MCA on 06/30/2020.  Clinical Impression  Pt presents to PT with deficits in functional mobility, gait, balance, endurance, strength, power, attention, communication. Pt with R weakness and inattention during session, can track past midline toward R but with L gaze preference. Pt demonstrates expressive and receptive aphasia, and requires physical initiation of most functional mobility tasks at this time. Pt is at a high falls risk due to impaired cognition and functional mobility. PT recommends CIR placement at the time of discharge to aide in improving gait/mobility quality and in an effort to reduce falls risk.     Follow Up Recommendations CIR    Equipment Recommendations  Wheelchair (measurements PT);Wheelchair cushion (measurements PT);3in1 (PT);Hospital bed;Rolling walker with 5" wheels (if D/C home today)    Recommendations for Other Services Rehab consult     Precautions / Restrictions Precautions Precautions: Fall Restrictions Weight Bearing Restrictions: No      Mobility  Bed Mobility Overal bed mobility: Needs Assistance Bed Mobility: Rolling;Sidelying to Sit Rolling: Max assist Sidelying to sit: Max assist       General bed mobility comments: pt is able to pull through LUE to elevate trunk, otherwise requires significant assistance for LE management and use of pad to rotate hips    Transfers Overall transfer level: Needs assistance Equipment used:  2 person hand held assist Transfers: Sit to/from [employer] Sit to Stand: Mod assist;+2 physical assistance Stand pivot transfers: Mod assist;+2 physical assistance       General transfer comment: pt requires initiation of sit to stand and tacilte cues/weight shift to initiate step and turn to recliner  Ambulation/Gait                Stairs            Wheelchair Mobility    Modified Rankin (Stroke Patients Only) Modified Rankin (Stroke Patients Only) Pre-Morbid Rankin Score: No symptoms (unsure, assume independent as pt was driving) Modified Rankin: Severe disability     Balance Overall balance assessment: Needs assistance Sitting-balance support: Single extremity supported;Bilateral upper extremity supported;Feet unsupported Sitting balance-Leahy Scale: Poor Sitting balance - Comments: minA   Standing balance support: Bilateral upper extremity supported Standing balance-Leahy Scale: Poor Standing balance comment: minA x2 hand hold                             Pertinent Vitals/Pain Pain Assessment: Faces Faces Pain Scale: Hurts little more Pain Location: RUE with painful timuli Pain Descriptors / Indicators: Grimacing Pain Intervention(s): Monitored during session    Home Living Family/patient expects to be discharged to:: Unsure                 Additional Comments: pt with expressive and receptive aphasia, unable to report history, no family present at the time of evaluation    Prior Function           Comments: unable to determine as no family present and  pt is aphasic. Based on documentation it seems the pt was able to drive her brothers car so PT assumes the pt is fairly independent.     Hand Dominance        Extremity/Trunk Assessment   Upper Extremity Assessment Upper Extremity Assessment: Defer to OT evaluation    Lower Extremity Assessment Lower Extremity Assessment: RLE deficits/detail;LLE  deficits/detail RLE Deficits / Details: pt demonstrates at least 3/5 strength, difficult to formally assess 2/2 communication deficits LLE Deficits / Details: at least 4-/5 based on observed mobility, difficult to formally assess    Cervical / Trunk Assessment Cervical / Trunk Assessment: Normal  Communication   Communication: Receptive difficulties;Expressive difficulties  Cognition Arousal/Alertness: Awake/alert Behavior During Therapy: Flat affect Overall Cognitive Status: Difficult to assess                                 General Comments: pt with expressive and receptive aphasia, does not follow verbal commands and inconsistently follows visual cues. Pt is able to repeat mobility with tactile cueing and therapist initiation.      General Comments General comments (skin integrity, edema, etc.): VSS on RA, pt coughing throughout session, drooling some, sats remains stable on 4L [place]    Exercises     Assessment/Plan    PT Assessment Patient needs continued PT services  PT Problem List Decreased strength;Decreased activity tolerance;Decreased balance;Decreased mobility;Decreased cognition;Decreased knowledge of use of DME;Decreased safety awareness;Decreased knowledge of precautions;Cardiopulmonary status limiting activity       PT Treatment Interventions DME instruction;Gait training;Stair training;Functional mobility training;Therapeutic exercise;Balance training;Therapeutic activities;Neuromuscular re-education;Cognitive remediation;Patient/family education;Wheelchair mobility training    PT Goals (Current goals can be found in the Care Plan section)  Acute Rehab PT Goals Patient Stated Goal: pt unable to state, PT goal to reduce falls risk PT Goal Formulation: Patient unable to participate in goal setting Time For Goal Achievement: 07/15/20 Potential to Achieve Goals: Good    Frequency Min 4X/week   Barriers to discharge        Co-evaluation PT/OT/SLP  Co-Evaluation/Treatment: Yes Reason for Co-Treatment: Complexity of the patient's impairments (multi-system involvement);For patient/therapist safety;Necessary to address cognition/behavior during functional activity;To address functional/ADL transfers PT goals addressed during session: Mobility/safety with mobility;Balance;Strengthening/ROM         AM-PAC PT "6 Clicks" Mobility  Outcome Measure Help needed turning from your back to your side while in a flat bed without using bedrails?: A Lot Help needed moving from lying on your back to sitting on the side of a flat bed without using bedrails?: A Lot Help needed moving to and from a bed to a chair (including a wheelchair)?: A Lot Help needed standing up from a chair using your arms (e.g., wheelchair or bedside chair)?: A Lot Help needed to walk in hospital room?: A Lot Help needed climbing 3-5 steps with a railing? : Total 6 Click Score: 11    End of Session Equipment Utilized During Treatment: Oxygen Activity Tolerance: Patient tolerated treatment well Patient left: in chair;with call bell/phone within reach;with chair alarm set Nurse Communication: Mobility status PT Visit Diagnosis: Other abnormalities of gait and mobility (R26.89);Muscle weakness (generalized) (M62.81);Other symptoms and signs involving the nervous system (M08.676)    Time: 1950-9326 PT Time Calculation (min) (ACUTE ONLY): 23 min   Charges:   PT Evaluation $PT Eval Moderate Complexity: 1 Mod          Zenaida Niece, PT, DPT Acute  Rehabilitation Pager: [phone]   Zenaida Niece 07/01/2020, 3:39 PM

## 2020-07-02 ENCOUNTER — Inpatient Hospital Stay (HOSPITAL_COMMUNITY): Payer: Medicare Other

## 2020-07-02 DIAGNOSIS — I63512 Cerebral infarction due to unspecified occlusion or stenosis of left middle cerebral artery: Secondary | ICD-10-CM

## 2020-07-02 DIAGNOSIS — I639 Cerebral infarction, unspecified: Secondary | ICD-10-CM | POA: Diagnosis not present

## 2020-07-02 LAB — CBC
HCT: 24.2 % — ABNORMAL LOW (ref 36.0–46.0)
HCT: 25.2 % — ABNORMAL LOW (ref 36.0–46.0)
Hemoglobin: 7.9 g/dL — ABNORMAL LOW (ref 12.0–15.0)
Hemoglobin: 8.2 g/dL — ABNORMAL LOW (ref 12.0–15.0)
MCH: 28 pg (ref 26.0–34.0)
MCH: 28.1 pg (ref 26.0–34.0)
MCHC: 32.6 g/dL (ref 30.0–36.0)
MCHC: 32.5 g/dL (ref 30.0–36.0)
MCV: 85.8 fL (ref 80.0–100.0)
MCV: 86.3 fL (ref 80.0–100.0)
Platelets: 387 10*3/uL (ref 150–400)
Platelets: 369 10*3/uL (ref 150–400)
RBC: 2.82 MIL/uL — ABNORMAL LOW (ref 3.87–5.11)
RBC: 2.92 MIL/uL — ABNORMAL LOW (ref 3.87–5.11)
RDW: 14.2 % (ref 11.5–15.5)
RDW: 14.2 % (ref 11.5–15.5)
WBC: 9.5 10*3/uL (ref 4.0–10.5)
WBC: 10.3 10*3/uL (ref 4.0–10.5)
nRBC: 0 % (ref 0.0–0.2)
nRBC: 0 % (ref 0.0–0.2)

## 2020-07-02 LAB — BASIC METABOLIC PANEL
Anion gap: 7 (ref 5–15)
BUN: 11 mg/dL (ref 8–23)
CO2: 21 mmol/L — ABNORMAL LOW (ref 22–32)
Calcium: 8.2 mg/dL — ABNORMAL LOW (ref 8.9–10.3)
Chloride: 115 mmol/L — ABNORMAL HIGH (ref 98–111)
Creatinine, Ser: 1.47 mg/dL — ABNORMAL HIGH (ref 0.44–1.00)
GFR, Estimated: 39 mL/min — ABNORMAL LOW (ref >60)
Glucose, Bld: 135 mg/dL — ABNORMAL HIGH (ref 70–99)
Potassium: 3.7 mmol/L (ref 3.5–5.1)
Sodium: 143 mmol/L (ref 135–145)

## 2020-07-02 LAB — GLUCOSE, CAPILLARY
Glucose-Capillary: 113 mg/dL — ABNORMAL HIGH (ref 70–99)
Glucose-Capillary: 123 mg/dL — ABNORMAL HIGH (ref 70–99)
Glucose-Capillary: 118 mg/dL — ABNORMAL HIGH (ref 70–99)
Glucose-Capillary: 133 mg/dL — ABNORMAL HIGH (ref 70–99)
Glucose-Capillary: 166 mg/dL — ABNORMAL HIGH (ref 70–99)
Glucose-Capillary: 168 mg/dL — ABNORMAL HIGH (ref 70–99)

## 2020-07-02 LAB — OCCULT BLOOD X 1 CARD TO LAB, STOOL: Fecal Occult Bld: NEGATIVE

## 2020-07-02 IMAGING — CT CT HEAD W/O CM
4 series · 16 of 47 positions shown, 18 images · non-contrast
Comparison: CT head [DATE], MRI head [DATE]

CLINICAL DATA: Stroke follow-up.  Intracranial hemorrhage.

EXAM:
CT HEAD WITHOUT CONTRAST
TECHNIQUE: Contiguous axial images were obtained from the base of the skull
through the vertex without intravenous contrast.

[Series 3: head wo · axial · 0.46mm/px · z∈[-92,+12]mm · 7 of 29 slices shown, 9 images]
[im 4/29  brain]
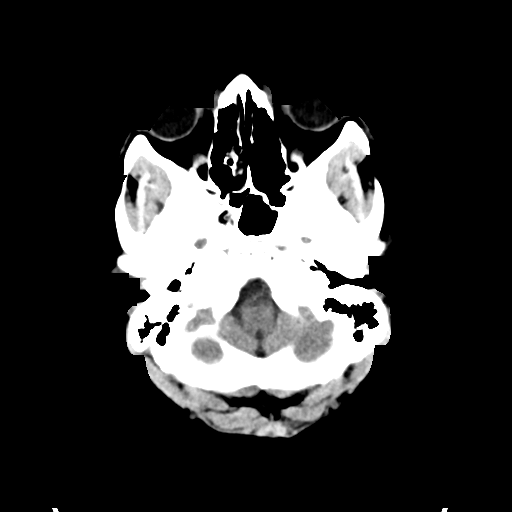
[im 4/29  bone]
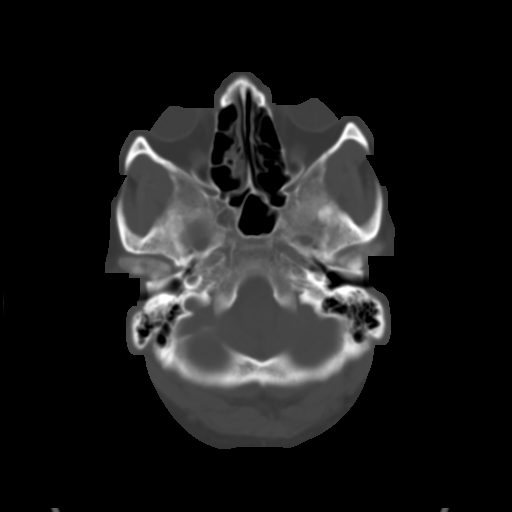
[im 8/29  brain]
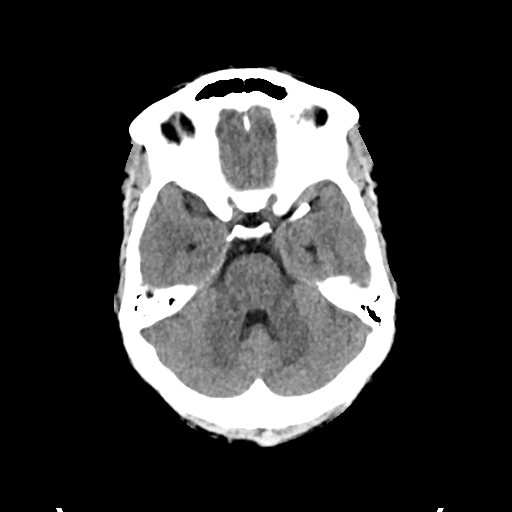
[im 11/29  brain]
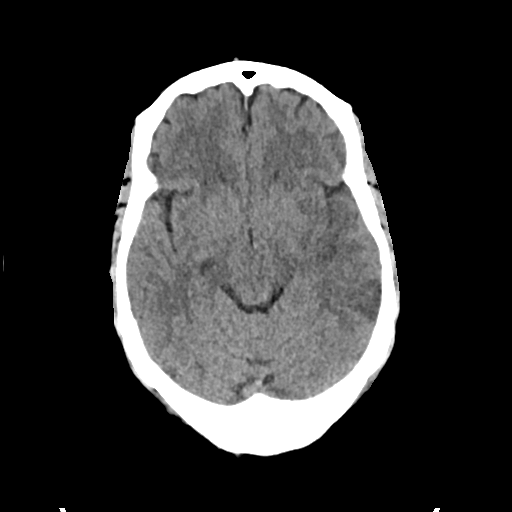
[im 15/29  brain]
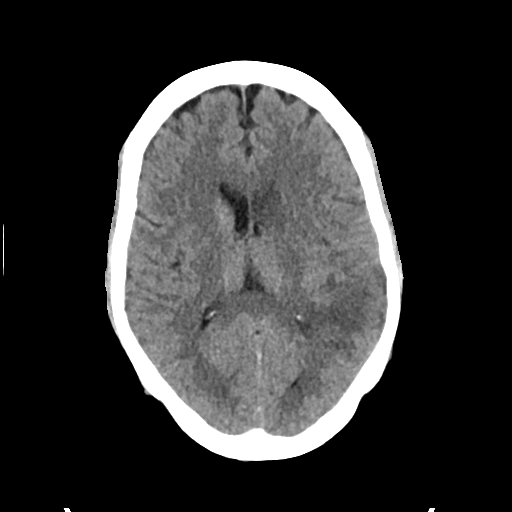
[im 18/29  brain]
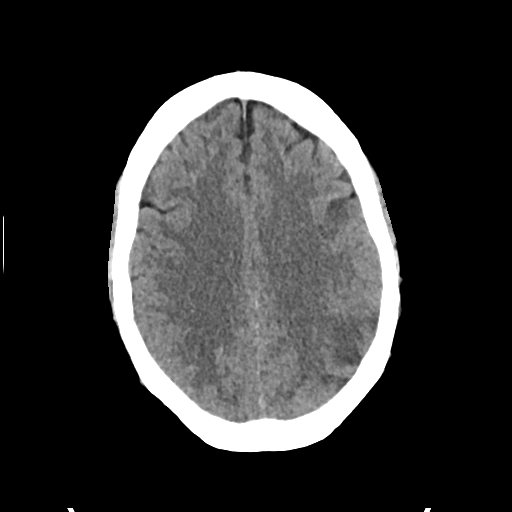
[im 18/29  bone]
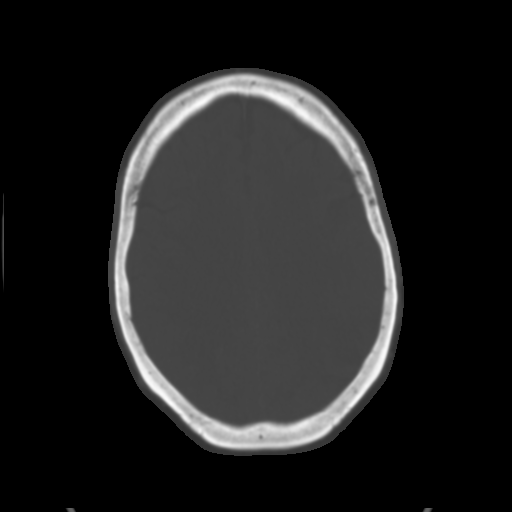
[im 22/29  brain]
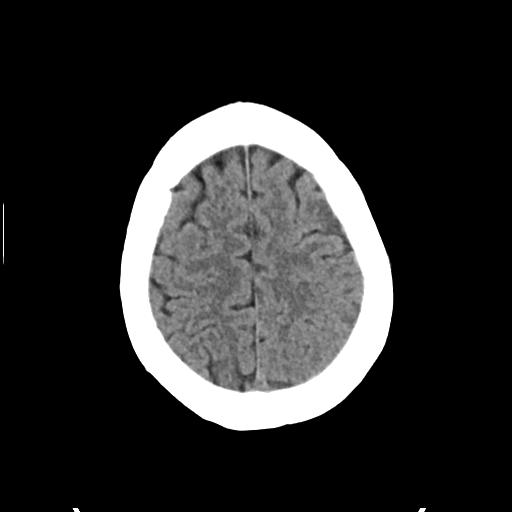
[im 25/29  brain]
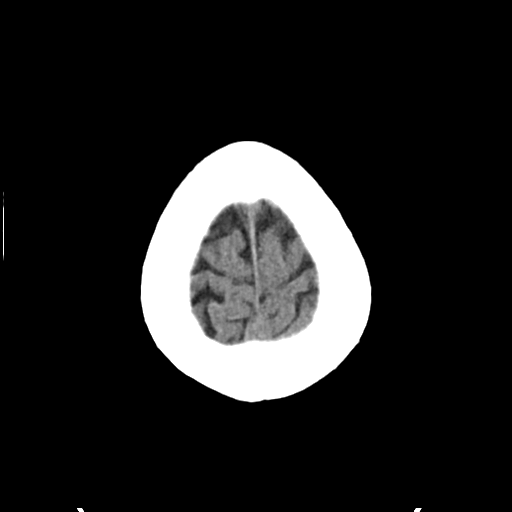

[Series 4: head bone · axial · 0.46mm/px · z∈[-94,-66]mm · 3 of 71 slices shown]
[im 8/71  bone]
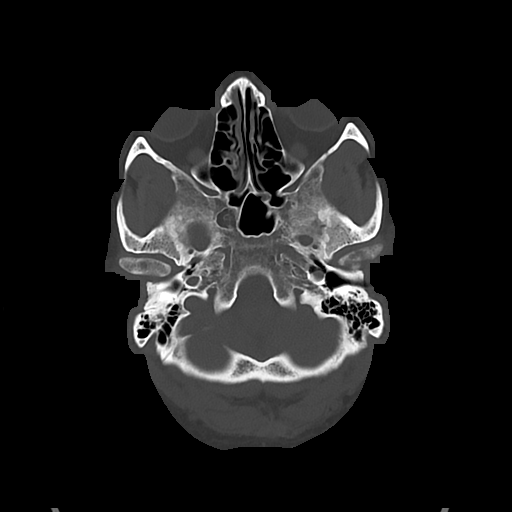
[im 15/71  bone]
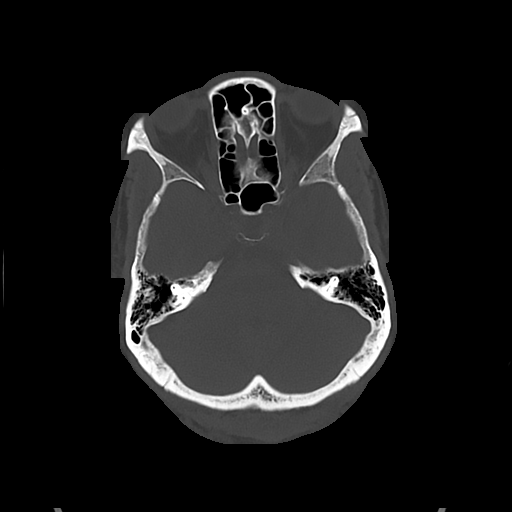
[im 22/71  bone]
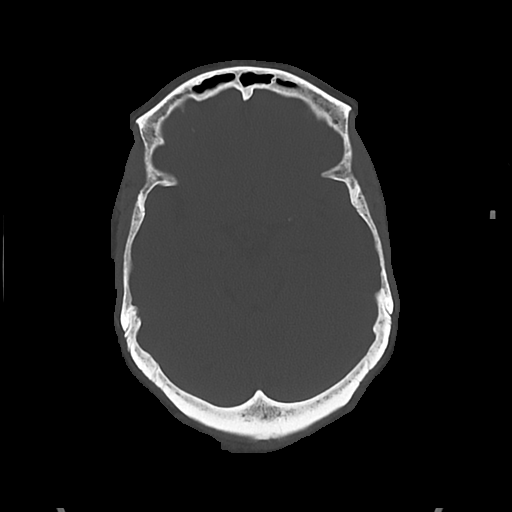

[Series 5: cor soft · coronal · 0.30mm/px · 3 of 66 slices shown]
[im 22/66  brain]
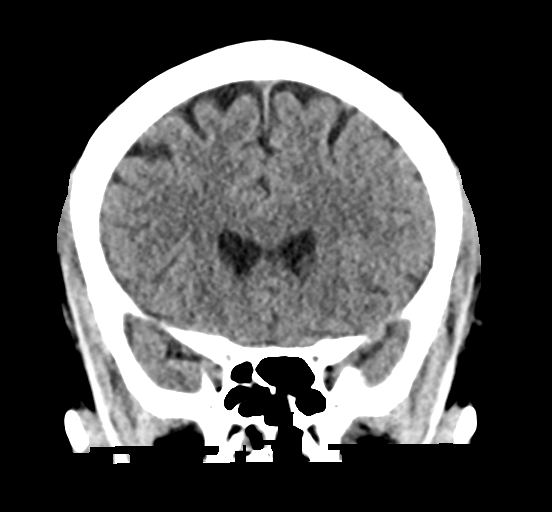
[im 29/66  brain]
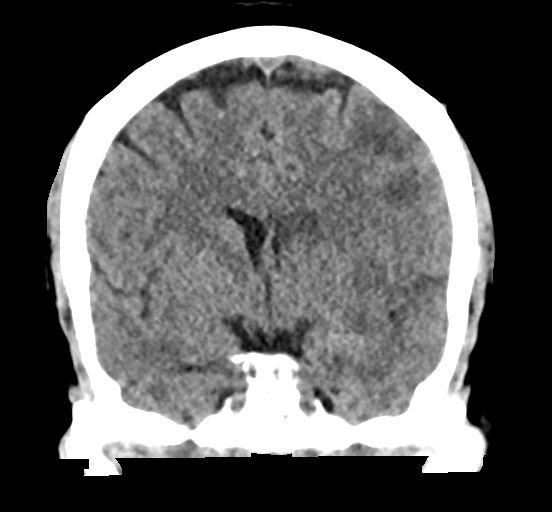
[im 37/66  brain]
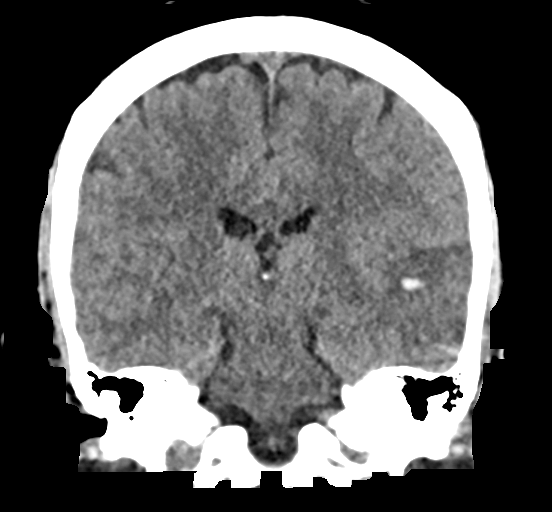

[Series 6: sag soft · sagittal · 0.30mm/px · 3 of 54 slices shown]
[im 18/54  brain]
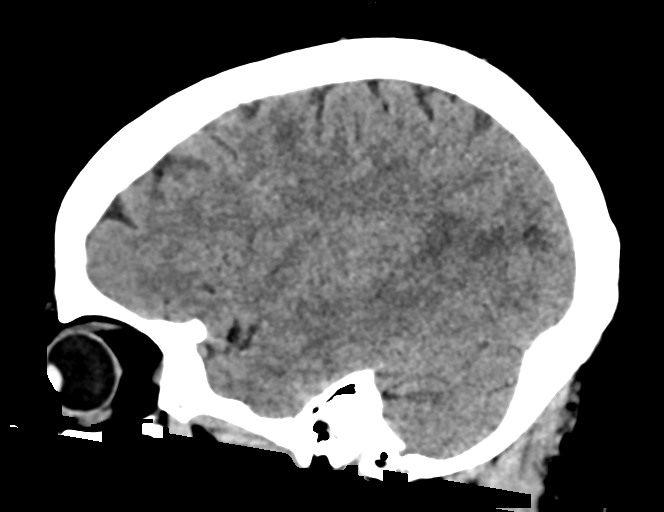
[im 27/54  brain]
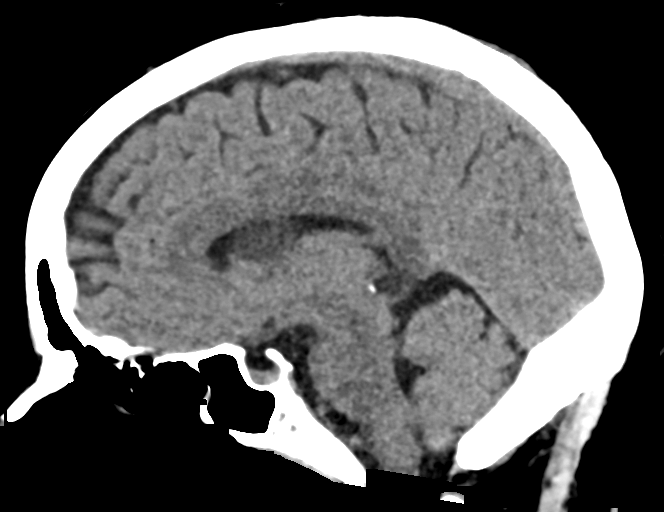
[im 36/54  brain]
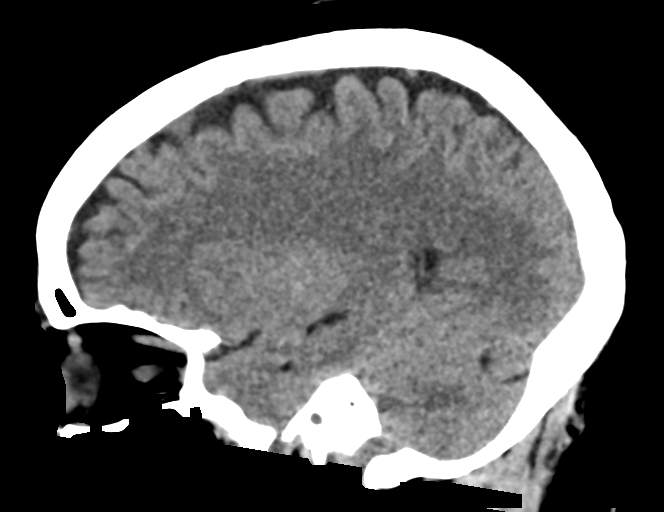

[16 of 47 positions shown; findings below may reference images not displayed]

FINDINGS: Brain: Left MCA infarct with hypodensity in the left temporal and
parietal lobe as well as in the left basal ganglia involving the
caudate and putamen. Small 1 cm hemorrhage left temporal lobe
unchanged from prior CT and MRI. Small focus of subarachnoid
hemorrhage in the left lateral temporal lobe which was present on
the prior CT.

Ventricle size normal. No midline shift. No new area of infarction.

Vascular: Negative for hyperdense vessel

Skull: Negative

Sinuses/Orbits: Paranasal sinuses clear. NG tube in place. Negative
orbit.

Other: None
IMPRESSION: Acute left MCA infarct unchanged from prior studies. Small amount of
hemorrhage left temporal lobe also unchanged. No new area of
hemorrhage or infarction.

## 2020-07-02 IMAGING — CT CT ABD-PELV W/O CM
2 of 4 series · 16 of 46 positions shown, 18 images · non-contrast
Comparison: CT dated [DATE]

CLINICAL DATA: Retroperitoneal hematoma.  Decreased hemoglobin.

EXAM:
CT ABDOMEN AND PELVIS WITHOUT CONTRAST
TECHNIQUE: Multidetector CT imaging of the abdomen and pelvis was performed
following the standard protocol without IV contrast.

[Series 4: ap without · axial · non-contrast · 0.77mm/px · z∈[-754,-314]mm · 13 of 100 slices shown, 15 images]
[im 6/100  soft-tissue]
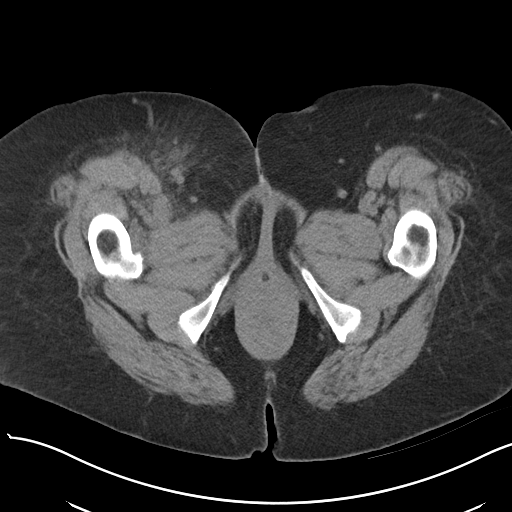
[im 6/100  bone]
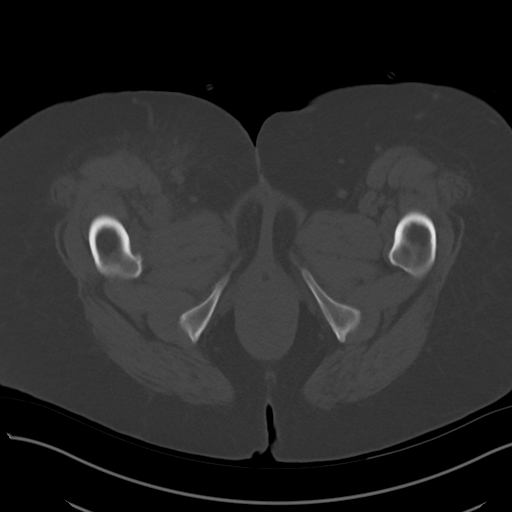
[im 12/100  soft-tissue]
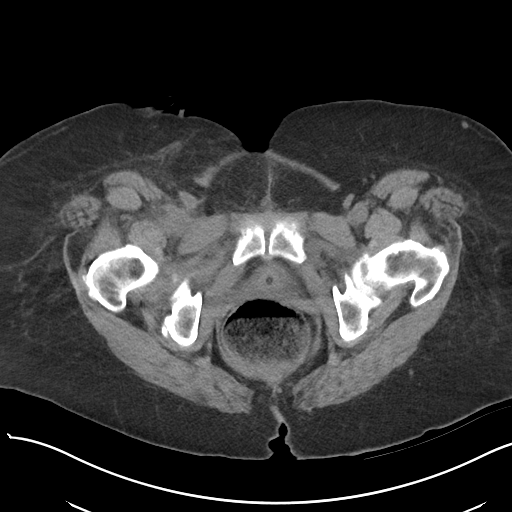
[im 23/100  soft-tissue]
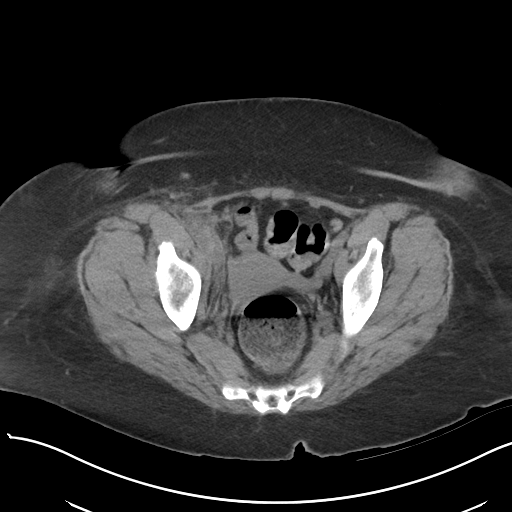
[im 28/100  soft-tissue]
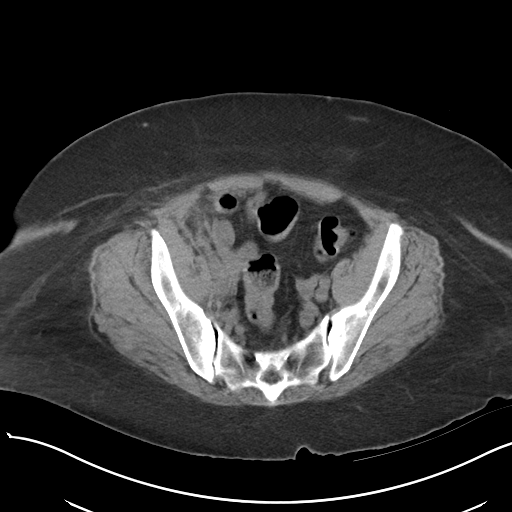
[im 34/100  soft-tissue]
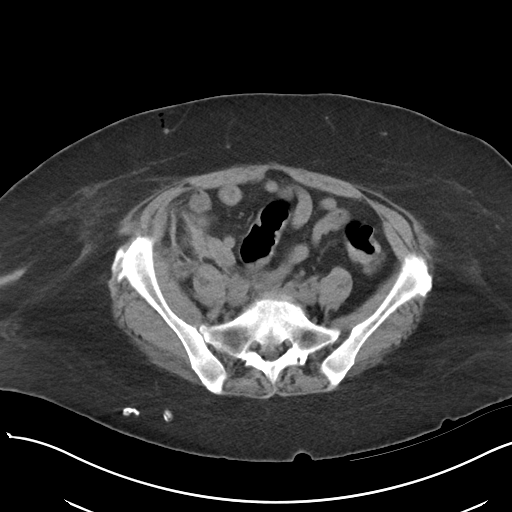
[im 45/100  soft-tissue]
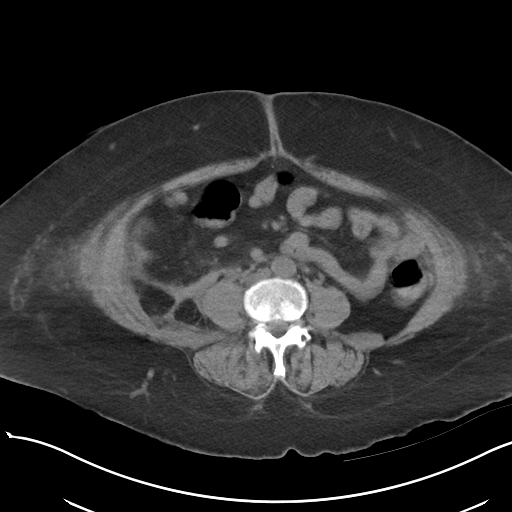
[im 50/100  soft-tissue]
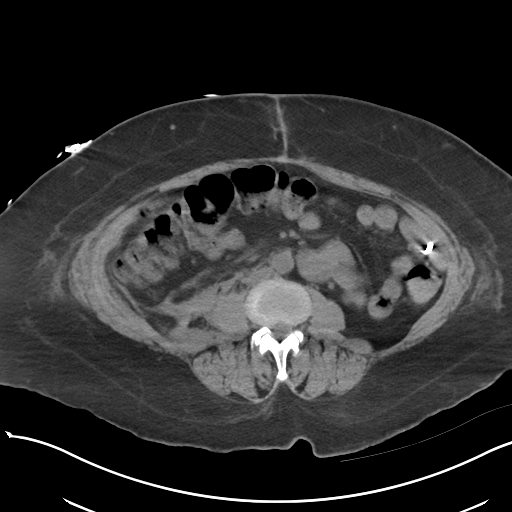
[im 56/100  soft-tissue]
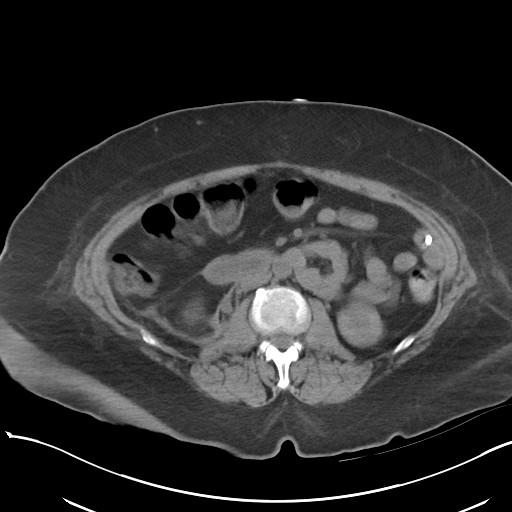
[im 67/100  soft-tissue]
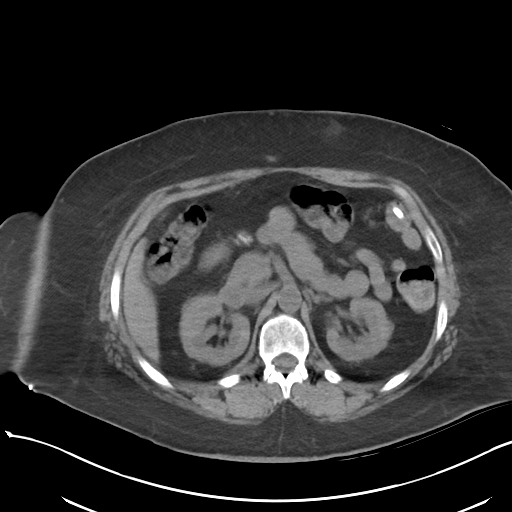
[im 67/100  bone]
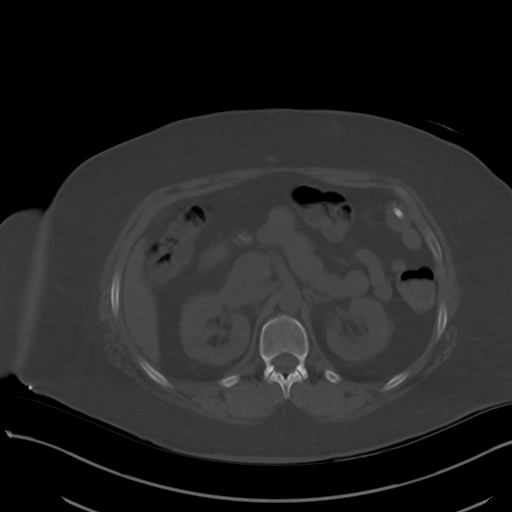
[im 72/100  soft-tissue]
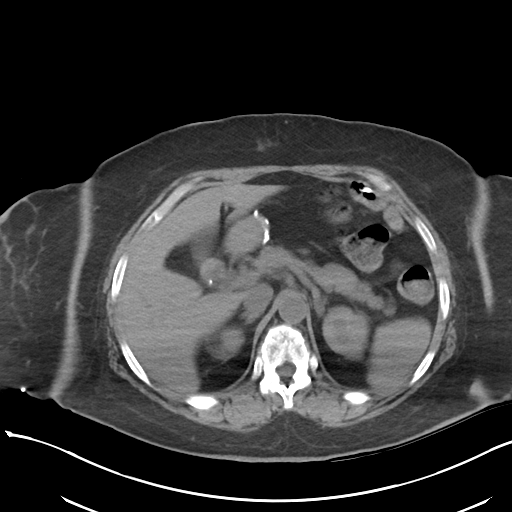
[im 78/100  soft-tissue]
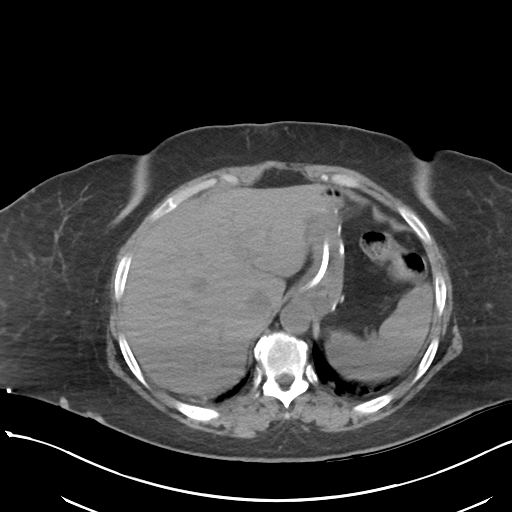
[im 89/100  soft-tissue]
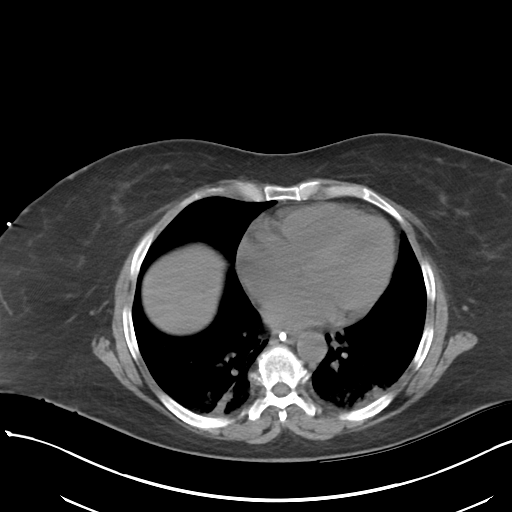
[im 94/100  soft-tissue]
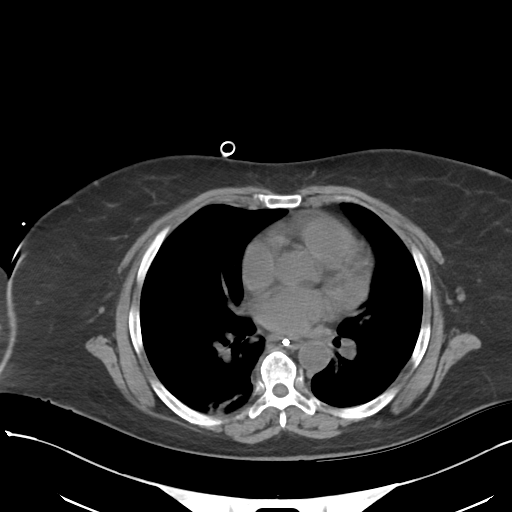

[Series 7: cor · coronal · 0.84mm/px · 3 of 102 slices shown]
[im 34/102  soft-tissue]
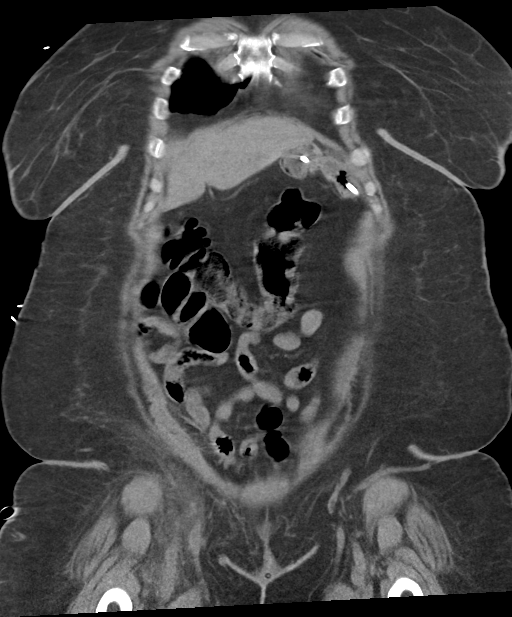
[im 45/102  soft-tissue]
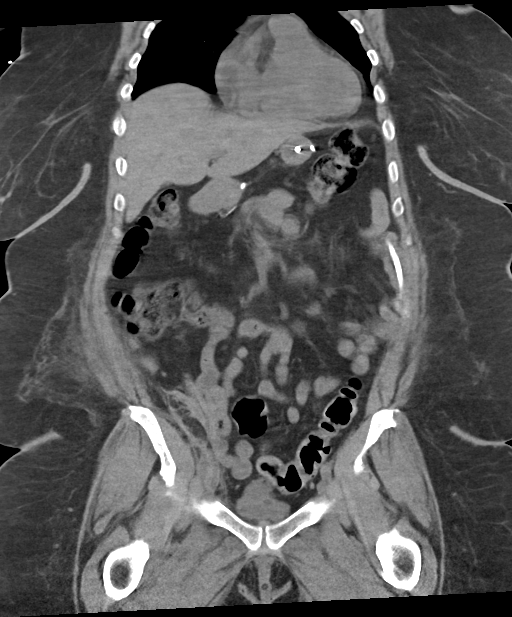
[im 57/102  soft-tissue]
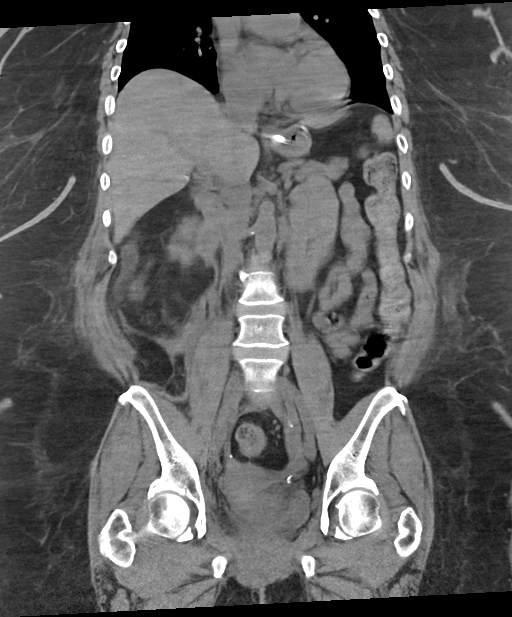

[16 of 46 positions shown; findings below may reference images not displayed]

FINDINGS: Lower chest: There is atelectasis versus aspiration at the lung
bases.The heart is enlarged. The intracardiac blood pool is
hypodense relative to the adjacent myocardium consistent with
anemia.

Hepatobiliary: The liver is normal. Status post
cholecystectomy.There is no biliary ductal dilation.

Pancreas: Normal contours without ductal dilatation. No
peripancreatic fluid collection.

Spleen: Unremarkable.

Adrenals/Urinary Tract:

--Adrenal glands: Unremarkable.

--Right kidney/ureter: No hydronephrosis or radiopaque kidney
stones.

--Left kidney/ureter: No hydronephrosis or radiopaque kidney stones.

--Urinary bladder: There is a Foley catheter in place.

Stomach/Bowel:

--Stomach/Duodenum: Patient is status post prior gastric bypass. The
enteric tube terminates in the proximal small bowel.

--Small bowel: Unremarkable.

--Colon: There is a large amount of stool at the level of the
rectum.

--Appendix: Not visualized. No right lower quadrant inflammation or
free fluid.

Vascular/Lymphatic: There is fat stranding in the right inguinal
region with a small retroperitoneal hematoma on the right.

--No retroperitoneal lymphadenopathy.

--No mesenteric lymphadenopathy.

--No pelvic or inguinal lymphadenopathy.

Reproductive: Unremarkable

Other: No ascites or free air. There is a small fat containing
umbilical hernia.

Musculoskeletal. No acute displaced fractures.
IMPRESSION: 1. Fat stranding in the right inguinal region with a small
retroperitoneal hematoma on the right.
2. Cardiomegaly.
3. Anemia.
4. Bibasilar atelectasis versus aspiration.
5. Large amount of stool at the level of the rectum.

## 2020-07-02 IMAGING — DX DG CHEST 1V PORT
1 series · 1 of 1 positions shown · non-contrast
Comparison: Chest x-ray dated [DATE].

CLINICAL DATA: Fever, shortness of breath, hypertension. Status
post LEFT MCA thrombectomy.

EXAM:
PORTABLE CHEST 1 VIEW

[chest ap]
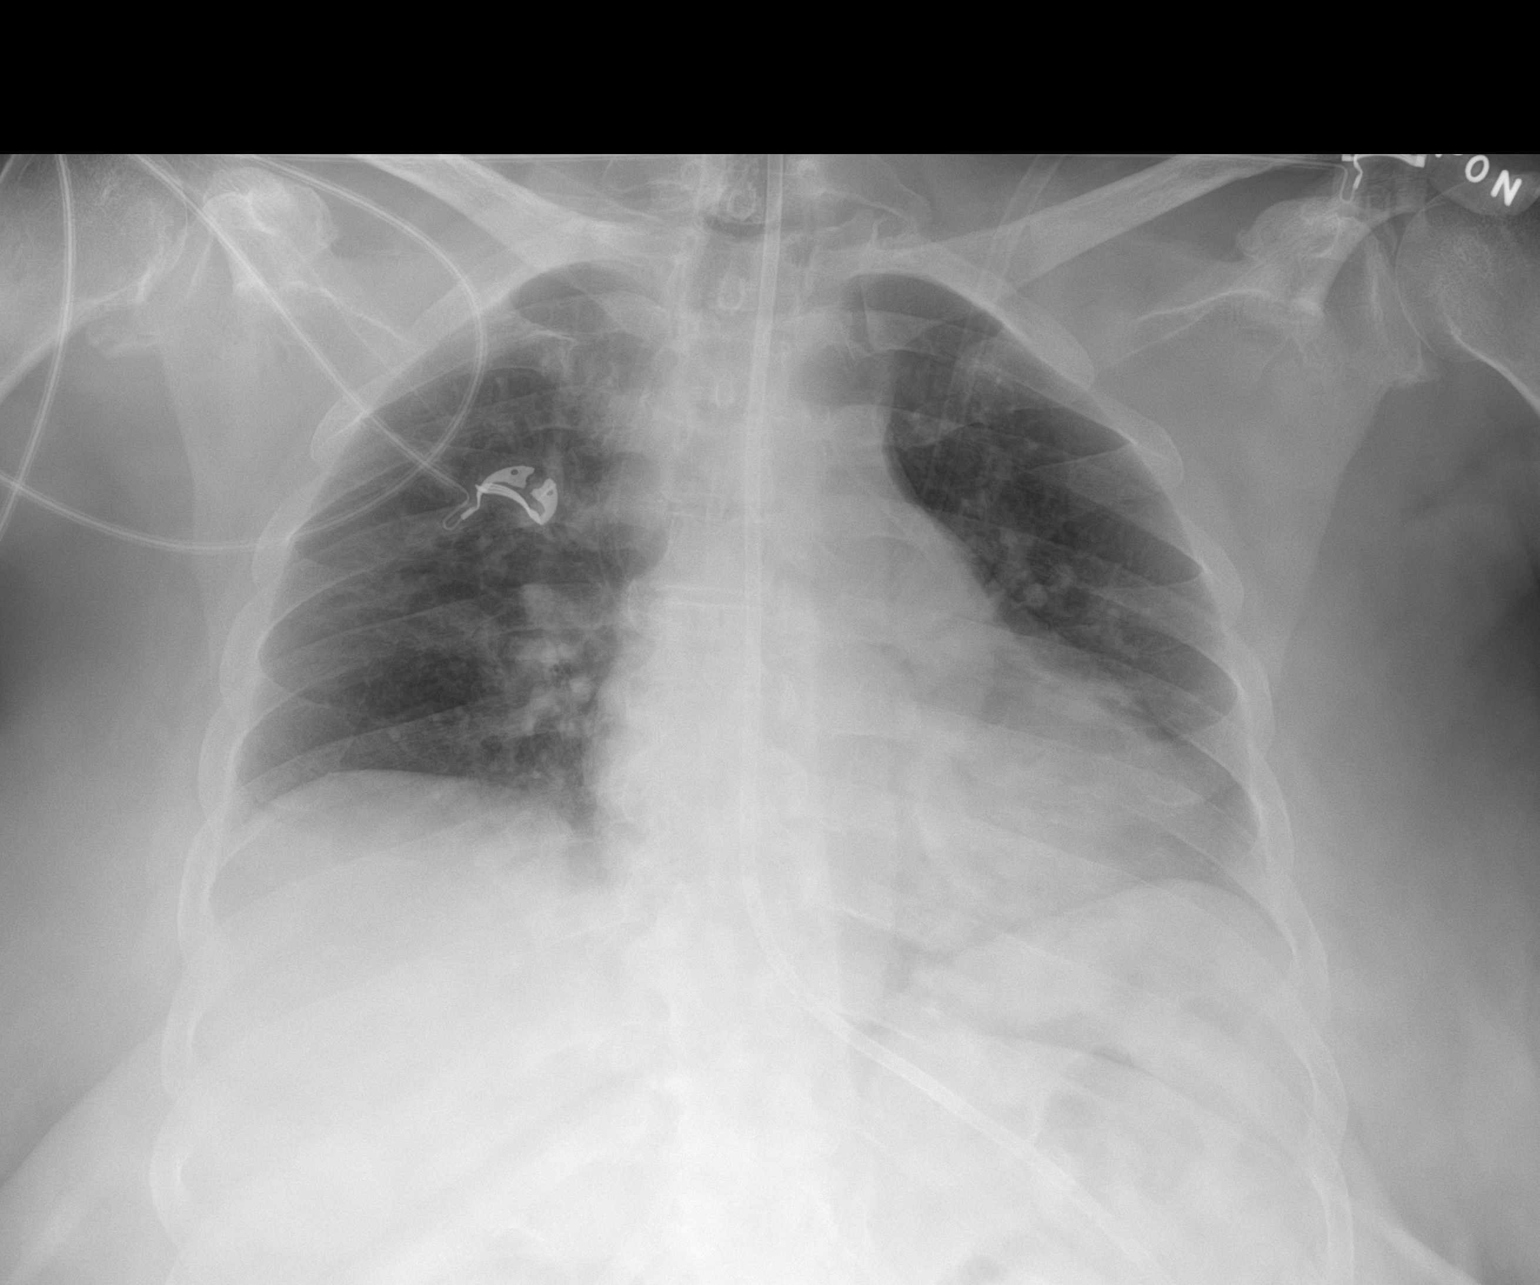

[1 of 1 positions shown; findings below may reference images not displayed]

FINDINGS: Endotracheal tube has been removed. Enteric tube passes below the
diaphragm. Heart size and mediastinal contours appear stable. Lungs
are clear. No pleural effusion or pneumothorax is seen.
IMPRESSION: No active disease. No evidence of pneumonia or pulmonary edema.

## 2020-07-02 MED ORDER — LABETALOL HCL 5 MG/ML IV SOLN
10.0000 mg | INTRAVENOUS | Status: DC | PRN
  Administered 2020-07-02 – 2020-07-05 (×5): 10 mg via INTRAVENOUS
  Filled 2020-07-02 (×5): qty 4

## 2020-07-02 MED ORDER — ALBUTEROL SULFATE (2.5 MG/3ML) 0.083% IN NEBU: 2.5000 mg | INHALATION_SOLUTION | Freq: Four times a day (QID) | RESPIRATORY_TRACT | Status: DC | PRN

## 2020-07-02 MED ORDER — BETHANECHOL CHLORIDE 10 MG PO TABS
25.0000 mg | ORAL_TABLET | Freq: Three times a day (TID) | ORAL | Status: DC
  Filled 2020-07-02: qty 3

## 2020-07-02 MED ORDER — LABETALOL HCL 5 MG/ML IV SOLN: 10.0000 mg | INTRAVENOUS | Status: DC | PRN

## 2020-07-02 MED ORDER — BETHANECHOL CHLORIDE 10 MG PO TABS
25.0000 mg | ORAL_TABLET | Freq: Three times a day (TID) | ORAL | Status: DC
  Administered 2020-07-02 – 2020-07-07 (×14): 25 mg
  Filled 2020-07-02 (×13): qty 3

## 2020-07-02 NOTE — Progress Notes (Addendum)
STROKE TEAM PROGRESS NOTE   SUBJECTIVE (INTERVAL HISTORY) She is status post thrombectomy.  The patient appears to be unchanged neurologically.  Unfortunately, she has had a drop in her hemoglobin from initial of 11-9 and now down to 7.9.  No clear bleeding noted.   OBJECTIVE Temp:  [98.6 F (37 C)-100.8 F (38.2 C)] 100.8 F (38.2 C) (01/29 0400) Pulse Rate:  [66-97] 95 (01/29 0800) Cardiac Rhythm: Normal sinus rhythm (01/28 2000) Resp:  [14-28] 23 (01/29 0800) BP: (78-150)/(52-81) 117/61 (01/29 0800) SpO2:  [92 %-100 %] 97 % (01/29 0800) Arterial Line BP: (72-128)/(36-55) 128/55 (01/28 1300) FiO2 (%):  [40 %] 40 % (01/28 0822) Weight:  [93 kg] 93 kg (01/29 0500)  Recent Labs  Lab 07/01/20 1606 07/01/20 1920 07/01/20 2322 07/02/20 0333 07/02/20 0735  GLUCAP 96 113* 100* 113* 123*   Recent Labs  Lab 06/30/20 0710 06/30/20 0719 06/30/20 1405 07/01/20 0513 07/02/20 0715  NA 140 142 142 141 143  K 3.5 3.6 3.4* 3.2* 3.7  CL 111 111  --  113* 115*  CO2 20*  --   --  17* 21*  GLUCOSE 140* 135*  --  138* 135*  BUN 14 17  --  13 11  CREATININE 1.41* 1.30*  --  1.44* 1.47*  CALCIUM 9.0  --   --  8.0* 8.2*   Recent Labs  Lab 06/30/20 0710 07/01/20 0513  AST 21 17  ALT 15 11  ALKPHOS 101 82  BILITOT 0.5 0.6  PROT 6.4* 5.3*  ALBUMIN 3.4* 2.8*   Recent Labs  Lab 06/30/20 0710 06/30/20 0719 06/30/20 1405 07/01/20 0513 07/02/20 0715  WBC 6.1  --   --  11.1* 9.5  NEUTROABS 3.6  --   --  9.5*  --   HGB 11.5* 11.2* 9.9* 9.1* 7.9*  HCT 36.3 33.0* 29.0* 27.1* 24.2*  MCV 88.5  --   --  84.4 85.8  PLT 507*  --   --  470* 387   No results for input(s): CKTOTAL, CKMB, CKMBINDEX, TROPONINI in the last 168 hours. Recent Labs    06/30/20 0710  LABPROT 13.6  INR 1.1   Recent Labs    06/30/20 0856  COLORURINE YELLOW  LABSPEC 1.017  PHURINE 5.0  GLUCOSEU NEGATIVE  HGBUR MODERATE*  BILIRUBINUR NEGATIVE  KETONESUR NEGATIVE  PROTEINUR NEGATIVE  NITRITE NEGATIVE   LEUKOCYTESUR TRACE*       Component Value Date/Time   CHOL 143 07/01/2020 0513   TRIG 172 (H) 07/01/2020 0513   TRIG 164 (H) 07/01/2020 0513   HDL 41 07/01/2020 0513   CHOLHDL 3.5 07/01/2020 0513   VLDL 34 07/01/2020 0513   LDLCALC 68 07/01/2020 0513   Lab Results  Component Value Date   HGBA1C 5.4 07/01/2020      Component Value Date/Time   LABOPIA NONE DETECTED 06/30/2020 0825   COCAINSCRNUR NONE DETECTED 06/30/2020 0825   LABBENZ NONE DETECTED 06/30/2020 0825   AMPHETMU NONE DETECTED 06/30/2020 0825   THCU NONE DETECTED 06/30/2020 0825   LABBARB NONE DETECTED 06/30/2020 0825    No results for input(s): ETH in the last 168 hours.  I have personally reviewed the radiological images below and agree with the radiology interpretations.  CT Code Stroke CTA Head W/WO contrast  Result Date: 06/30/2020 CLINICAL DATA:  65 year old female code stroke presentation with left MCA ASPECTS 6. Left gaze deviation. EXAM: CT ANGIOGRAPHY HEAD AND NECK CT PERFUSION BRAIN TECHNIQUE: Multidetector CT imaging of the head and neck  was performed using the standard protocol during bolus administration of intravenous contrast. Multiplanar CT image reconstructions and MIPs were obtained to evaluate the vascular anatomy. Carotid stenosis measurements (when applicable) are obtained utilizing NASCET criteria, using the distal internal carotid diameter as the denominator. Multiphase CT imaging of the brain was performed following IV bolus contrast injection. Subsequent parametric perfusion maps were calculated using RAPID software. CONTRAST:  136mL OMNIPAQUE IOHEXOL 350 MG/ML SOLN COMPARISON:  Plain head CT today 0721 hours. [facility] brain MRI 02/15/2016 FINDINGS: CT Brain Perfusion Findings: ASPECTS: 6 CBF (<30%) Volume: 41mL (erroneous). Using CBF less than 38% 11 mL of parenchyma is detected which does partially correspond to the area of cytotoxic edema by plain CT.  Perfusion (Tmax>6.0s) volume: 57mL, hypoperfusion index 0.1 but also might be spurrious. Mismatch Volume: Calculated is not accurate due to erroneously low infarct core, estimated penumbra given the above is 60 to 67 mL. Infarction Location:Left MCA The above was discussed by telephone with Dr. Lesleigh Noe on 06/30/2020 at 07:49 . CTA NECK Skeleton: No acute osseous abnormality identified. Dystrophic/degenerative calcifications of the longus coli muscle insertion at C1-C2. Upper chest: Negative. Other neck: No acute finding. Incidental contrast reflux into a left posterior neck vein. Aortic arch: Slightly bovine arch configuration. No arch atherosclerosis. Right carotid system: Mildly tortuous proximal right CCA. Moderate calcified plaque at the right ICA origin with less than 50 % stenosis with respect to the distal vessel. Mild tortuosity. Left carotid system: Patent, mildly tortuous left CCA. Mild to moderate calcified plaque at the posterior left ICA origin with less than 50 % stenosis with respect to the distal vessel. Mildly tortuous. Vertebral arteries: Negative proximal right subclavian artery and right vertebral artery origin. The right vertebral is mildly tortuous but patent to the skull base without stenosis. Moderate soft more than calcified plaque in the proximal left subclavian artery although no significant stenosis. Left vertebral artery origin remains normal. Codominant left vertebral artery is patent to the skull base without stenosis. CTA HEAD Posterior circulation: Mild right V4 calcified plaque. Patent distal vertebral arteries to the basilar with mild stenosis at the vertebrobasilar junction. Diminutive PICA. Left AICA appears dominant. Patent basilar artery is diminutive but without stenosis. Patent SCA origins with fetal type bilateral PCA origins. Patent basilar tip. Bilateral PCA branches are within normal limits. Anterior circulation: Both ICA siphons are patent. No significant siphon  plaque or stenosis. Normal posterior communicating artery origins. Patent carotid termini although the left MCA origin is occluded (series 10, image 19). There is some left MCA branch reconstitution as seen on series 13, image 43. Left ACA origin is normal. There is severe stenosis at the right ACA origin. Anterior communicating artery is diminutive. Bilateral A2 branches appear symmetric and within normal limits. Right MCA origin is patent but also irregular with mild stenosis. Right MCA M1 and right MCA bifurcation are patent, although with moderate stenosis at the posterior right MCA M2 origin (series 12, image 10). No right MCA branch occlusion identified. Venous sinuses: Early contrast timing, grossly patent. Anatomic variants: Fetal type PCA origins. Review of the MIP images confirms the above findings IMPRESSION: 1. Positive for emergent large vessel occlusion: Left MCA origin. Some left MCA branch reconstitution. 2. CT Perfusion underestimates infarct core (ASPECTS 6), which is estimated at 11-22 mL, subsequent estimated penumbra of 60 to 67 mL. 3. No other large vessel occlusion. But intracranial atherosclerosis with: - Severe stenosis Right ACA origin. - moderate stenosis  Right MCA M2 branch origin. - mild stenosis Right MCA M1, also Vertebrobasilar junction. 4. Cervical carotid atherosclerosis without stenosis. #1 discussed by telephone with Dr. Lesleigh Noe on 06/30/2020 at 0732 hours. And [facility] CTP findings discussed at 0749 hours. Electronically Signed   By: Genevie Ann M.D.   On: 06/30/2020 07:58   CT HEAD WO CONTRAST  Result Date: 06/30/2020 CLINICAL DATA:  Stroke, left MCA occlusion post thrombectomy and stenting EXAM: CT HEAD WITHOUT CONTRAST TECHNIQUE: Contiguous axial images were obtained from the base of the skull through the vertex without intravenous contrast. COMPARISON:  Earlier same day FINDINGS: Brain: Probable few patchy areas of loss of gray-white differentiation are identified in the  left MCA territory. This is less apparent than on the prior study. There is some ill-defined patchy hyperdensity, most discrete in the superior left temporal lobe on axial series 3, image 14. There is no significant mass effect. No hydrocephalus. Small chronic right cerebellar infarct. Vascular: New stent of the left M1 MCA. Skull: Calvarium is unremarkable. Sinuses/Orbits: No acute finding. Other: None. IMPRESSION: Probable few small areas of acute left MCA infarction as before but less apparent. Ill-defined patchy hyperdensity may reflect contrast staining and/or reperfusion petechial hemorrhage. No significant mass effect. Electronically Signed   By: Macy Mis M.D.   On: 06/30/2020 16:41   CT Code Stroke CTA Neck W/WO contrast  Result Date: 06/30/2020 CLINICAL DATA:  65 year old female code stroke presentation with left MCA ASPECTS 6. Left gaze deviation. EXAM: CT ANGIOGRAPHY HEAD AND NECK CT PERFUSION BRAIN TECHNIQUE: Multidetector CT imaging of the head and neck was performed using the standard protocol during bolus administration of intravenous contrast. Multiplanar CT image reconstructions and MIPs were obtained to evaluate the vascular anatomy. Carotid stenosis measurements (when applicable) are obtained utilizing NASCET criteria, using the distal internal carotid diameter as the denominator. Multiphase CT imaging of the brain was performed following IV bolus contrast injection. Subsequent parametric perfusion maps were calculated using RAPID software. CONTRAST:  151mL OMNIPAQUE IOHEXOL 350 MG/ML SOLN COMPARISON:  Plain head CT today 0721 hours. [facility] brain MRI 02/15/2016 FINDINGS: CT Brain Perfusion Findings: ASPECTS: 6 CBF (<30%) Volume: 85mL (erroneous). Using CBF less than 38% 11 mL of parenchyma is detected which does partially correspond to the area of cytotoxic edema by plain CT. Perfusion (Tmax>6.0s) volume: 67mL, hypoperfusion index 0.1 but also  might be spurrious. Mismatch Volume: Calculated is not accurate due to erroneously low infarct core, estimated penumbra given the above is 60 to 67 mL. Infarction Location:Left MCA The above was discussed by telephone with Dr. Lesleigh Noe on 06/30/2020 at 07:49 . CTA NECK Skeleton: No acute osseous abnormality identified. Dystrophic/degenerative calcifications of the longus coli muscle insertion at C1-C2. Upper chest: Negative. Other neck: No acute finding. Incidental contrast reflux into a left posterior neck vein. Aortic arch: Slightly bovine arch configuration. No arch atherosclerosis. Right carotid system: Mildly tortuous proximal right CCA. Moderate calcified plaque at the right ICA origin with less than 50 % stenosis with respect to the distal vessel. Mild tortuosity. Left carotid system: Patent, mildly tortuous left CCA. Mild to moderate calcified plaque at the posterior left ICA origin with less than 50 % stenosis with respect to the distal vessel. Mildly tortuous. Vertebral arteries: Negative proximal right subclavian artery and right vertebral artery origin. The right vertebral is mildly tortuous but patent to the skull base without stenosis. Moderate soft more than calcified plaque in the proximal left subclavian  artery although no significant stenosis. Left vertebral artery origin remains normal. Codominant left vertebral artery is patent to the skull base without stenosis. CTA HEAD Posterior circulation: Mild right V4 calcified plaque. Patent distal vertebral arteries to the basilar with mild stenosis at the vertebrobasilar junction. Diminutive PICA. Left AICA appears dominant. Patent basilar artery is diminutive but without stenosis. Patent SCA origins with fetal type bilateral PCA origins. Patent basilar tip. Bilateral PCA branches are within normal limits. Anterior circulation: Both ICA siphons are patent. No significant siphon plaque or stenosis. Normal posterior communicating artery origins.  Patent carotid termini although the left MCA origin is occluded (series 10, image 19). There is some left MCA branch reconstitution as seen on series 13, image 43. Left ACA origin is normal. There is severe stenosis at the right ACA origin. Anterior communicating artery is diminutive. Bilateral A2 branches appear symmetric and within normal limits. Right MCA origin is patent but also irregular with mild stenosis. Right MCA M1 and right MCA bifurcation are patent, although with moderate stenosis at the posterior right MCA M2 origin (series 12, image 10). No right MCA branch occlusion identified. Venous sinuses: Early contrast timing, grossly patent. Anatomic variants: Fetal type PCA origins. Review of the MIP images confirms the above findings IMPRESSION: 1. Positive for emergent large vessel occlusion: Left MCA origin. Some left MCA branch reconstitution. 2. CT Perfusion underestimates infarct core (ASPECTS 6), which is estimated at 11-22 mL, subsequent estimated penumbra of 60 to 67 mL. 3. No other large vessel occlusion. But intracranial atherosclerosis with: - Severe stenosis Right ACA origin. - moderate stenosis Right MCA M2 branch origin. - mild stenosis Right MCA M1, also Vertebrobasilar junction. 4. Cervical carotid atherosclerosis without stenosis. #1 discussed by telephone with Dr. Lesleigh Noe on 06/30/2020 at 0732 hours. And [facility] CTP findings discussed at 0749 hours. Electronically Signed   By: Genevie Ann M.D.   On: 06/30/2020 07:58   MR ANGIO HEAD WO CONTRAST  Result Date: 07/01/2020 CLINICAL DATA:  Stroke.  Post left MCA thrombectomy. EXAM: MRI HEAD WITHOUT CONTRAST MRA HEAD WITHOUT CONTRAST TECHNIQUE: Multiplanar, multiecho pulse sequences of the brain and surrounding structures were obtained without intravenous contrast. Angiographic images of the head were obtained using MRA technique without contrast. COMPARISON:  CT head 06/30/2020.  CTA head 06/30/2020 FINDINGS: MRI HEAD FINDINGS Brain: Acute  infarct left MCA territory. There is involvement of the left basal ganglia including the caudate and putamen. There is infarct involving the superior left temporal lobe as well as the insula and the left parietal lobe. Small area of hemorrhage is present within the left posterior temporal lobe measuring approximately 1 cm. This was hyperdense on CT yesterday. Scattered small areas of acute infarct in the left frontal lobe. Small area of acute infarct in the right cerebellum and in the occipital white matter bilaterally. Ventricle size normal.  No midline shift.  No mass lesion. Vascular: Normal arterial flow voids Skull and upper cervical spine: Negative Sinuses/Orbits: Mild mucosal edema paranasal sinuses. Negative orbit Other: None MRA HEAD FINDINGS Decreased signal in the carotid bilaterally at the skull base is felt to be artifact. This area appears widely patent on CTA. Decreased signal left cavernous carotid also artifact. Both cavernous carotids are widely patent on CTA. Moderate stenosis proximal right A1 segment. Mild stenosis right M1. Moderate stenosis right MCA bifurcation. Interval placement of stent in the left middle cerebral artery. There is flow related signal in left MCA branches indicating the stent is patent. Both vertebral  arteries patent to the basilar. Basilar widely patent. Right PICA patent. Left AICA patent. Superior cerebellar and posterior cerebral arteries patent bilaterally. Fetal origin left posterior cerebral artery. Right posterior communicating artery is patent. IMPRESSION: Acute infarct left MCA territory involving the basal ganglia as well as the left temporal, frontal, and parietal lobes. Small area of hemorrhage in the left posterior temporal lobe. Additional small areas of acute infarct in the occipital white matter bilaterally and right cerebellum. Left MCA stent appears patent. Moderate intracranial atherosclerotic disease as above. Electronically Signed   By: Franchot Gallo  M.D.   On: 07/01/2020 18:55   MR BRAIN WO CONTRAST  Result Date: 07/01/2020 CLINICAL DATA:  Stroke.  Post left MCA thrombectomy. EXAM: MRI HEAD WITHOUT CONTRAST MRA HEAD WITHOUT CONTRAST TECHNIQUE: Multiplanar, multiecho pulse sequences of the brain and surrounding structures were obtained without intravenous contrast. Angiographic images of the head were obtained using MRA technique without contrast. COMPARISON:  CT head 06/30/2020.  CTA head 06/30/2020 FINDINGS: MRI HEAD FINDINGS Brain: Acute infarct left MCA territory. There is involvement of the left basal ganglia including the caudate and putamen. There is infarct involving the superior left temporal lobe as well as the insula and the left parietal lobe. Small area of hemorrhage is present within the left posterior temporal lobe measuring approximately 1 cm. This was hyperdense on CT yesterday. Scattered small areas of acute infarct in the left frontal lobe. Small area of acute infarct in the right cerebellum and in the occipital white matter bilaterally. Ventricle size normal.  No midline shift.  No mass lesion. Vascular: Normal arterial flow voids Skull and upper cervical spine: Negative Sinuses/Orbits: Mild mucosal edema paranasal sinuses. Negative orbit Other: None MRA HEAD FINDINGS Decreased signal in the carotid bilaterally at the skull base is felt to be artifact. This area appears widely patent on CTA. Decreased signal left cavernous carotid also artifact. Both cavernous carotids are widely patent on CTA. Moderate stenosis proximal right A1 segment. Mild stenosis right M1. Moderate stenosis right MCA bifurcation. Interval placement of stent in the left middle cerebral artery. There is flow related signal in left MCA branches indicating the stent is patent. Both vertebral arteries patent to the basilar. Basilar widely patent. Right PICA patent. Left AICA patent. Superior cerebellar and posterior cerebral arteries patent bilaterally. Fetal origin left  posterior cerebral artery. Right posterior communicating artery is patent. IMPRESSION: Acute infarct left MCA territory involving the basal ganglia as well as the left temporal, frontal, and parietal lobes. Small area of hemorrhage in the left posterior temporal lobe. Additional small areas of acute infarct in the occipital white matter bilaterally and right cerebellum. Left MCA stent appears patent. Moderate intracranial atherosclerotic disease as above. Electronically Signed   By: Franchot Gallo M.D.   On: 07/01/2020 18:55   CT Code Stroke Cerebral Perfusion with contrast  Result Date: 06/30/2020 CLINICAL DATA:  65 year old female code stroke presentation with left MCA ASPECTS 6. Left gaze deviation. EXAM: CT ANGIOGRAPHY HEAD AND NECK CT PERFUSION BRAIN TECHNIQUE: Multidetector CT imaging of the head and neck was performed using the standard protocol during bolus administration of intravenous contrast. Multiplanar CT image reconstructions and MIPs were obtained to evaluate the vascular anatomy. Carotid stenosis measurements (when applicable) are obtained utilizing NASCET criteria, using the distal internal carotid diameter as the denominator. Multiphase CT imaging of the brain was performed following IV bolus contrast injection. Subsequent parametric perfusion maps were calculated using RAPID software. CONTRAST:  143mL OMNIPAQUE IOHEXOL 350 MG/ML SOLN  COMPARISON:  Plain head CT today 0721 hours. [facility] brain MRI 02/15/2016 FINDINGS: CT Brain Perfusion Findings: ASPECTS: 6 CBF (<30%) Volume: 53mL (erroneous). Using CBF less than 38% 11 mL of parenchyma is detected which does partially correspond to the area of cytotoxic edema by plain CT. Perfusion (Tmax>6.0s) volume: 25mL, hypoperfusion index 0.1 but also might be spurrious. Mismatch Volume: Calculated is not accurate due to erroneously low infarct core, estimated penumbra given the above is 60 to 67 mL. Infarction  Location:Left MCA The above was discussed by telephone with Dr. Lesleigh Noe on 06/30/2020 at 07:49 . CTA NECK Skeleton: No acute osseous abnormality identified. Dystrophic/degenerative calcifications of the longus coli muscle insertion at C1-C2. Upper chest: Negative. Other neck: No acute finding. Incidental contrast reflux into a left posterior neck vein. Aortic arch: Slightly bovine arch configuration. No arch atherosclerosis. Right carotid system: Mildly tortuous proximal right CCA. Moderate calcified plaque at the right ICA origin with less than 50 % stenosis with respect to the distal vessel. Mild tortuosity. Left carotid system: Patent, mildly tortuous left CCA. Mild to moderate calcified plaque at the posterior left ICA origin with less than 50 % stenosis with respect to the distal vessel. Mildly tortuous. Vertebral arteries: Negative proximal right subclavian artery and right vertebral artery origin. The right vertebral is mildly tortuous but patent to the skull base without stenosis. Moderate soft more than calcified plaque in the proximal left subclavian artery although no significant stenosis. Left vertebral artery origin remains normal. Codominant left vertebral artery is patent to the skull base without stenosis. CTA HEAD Posterior circulation: Mild right V4 calcified plaque. Patent distal vertebral arteries to the basilar with mild stenosis at the vertebrobasilar junction. Diminutive PICA. Left AICA appears dominant. Patent basilar artery is diminutive but without stenosis. Patent SCA origins with fetal type bilateral PCA origins. Patent basilar tip. Bilateral PCA branches are within normal limits. Anterior circulation: Both ICA siphons are patent. No significant siphon plaque or stenosis. Normal posterior communicating artery origins. Patent carotid termini although the left MCA origin is occluded (series 10, image 19). There is some left MCA branch reconstitution as seen on series 13, image 43.  Left ACA origin is normal. There is severe stenosis at the right ACA origin. Anterior communicating artery is diminutive. Bilateral A2 branches appear symmetric and within normal limits. Right MCA origin is patent but also irregular with mild stenosis. Right MCA M1 and right MCA bifurcation are patent, although with moderate stenosis at the posterior right MCA M2 origin (series 12, image 10). No right MCA branch occlusion identified. Venous sinuses: Early contrast timing, grossly patent. Anatomic variants: Fetal type PCA origins. Review of the MIP images confirms the above findings IMPRESSION: 1. Positive for emergent large vessel occlusion: Left MCA origin. Some left MCA branch reconstitution. 2. CT Perfusion underestimates infarct core (ASPECTS 6), which is estimated at 11-22 mL, subsequent estimated penumbra of 60 to 67 mL. 3. No other large vessel occlusion. But intracranial atherosclerosis with: - Severe stenosis Right ACA origin. - moderate stenosis Right MCA M2 branch origin. - mild stenosis Right MCA M1, also Vertebrobasilar junction. 4. Cervical carotid atherosclerosis without stenosis. #1 discussed by telephone with Dr. Lesleigh Noe on 06/30/2020 at 0732 hours. And [facility] CTP findings discussed at 0749 hours. Electronically Signed   By: Genevie Ann M.D.   On: 06/30/2020 07:58   Portable Chest x-ray  Result Date: 06/30/2020 CLINICAL DATA:  Evaluate endotracheal tube placement EXAM: PORTABLE CHEST  1 VIEW COMPARISON:  09/17/2019 FINDINGS: The tip of the ET tube appears oriented towards the left mainstem bronchus. Consider withdrawing by 2 cm. The enteric tube tip is in the proximal stomach. The side port for the enteric tube is just above the GE junction. Heart size normal. No pleural effusion, interstitial edema or airspace consolidation. IMPRESSION: 1. ET tube tip appears oriented towards the left mainstem bronchus. Consider withdrawing by 2 cm. 2. Consider advancement of enteric tube. 3. Lungs appear  clear. 4. These results will be called to the ordering clinician or representative by the Radiologist Assistant, and communication documented in the PACS or [employer]. Electronically Signed   By: Kerby Moors M.D.   On: 06/30/2020 13:59   DG Abd Portable 1V  Result Date: 07/01/2020 CLINICAL DATA:  Feeding tube placement EXAM: PORTABLE ABDOMEN - 1 VIEW COMPARISON:  CT abdomen 07/14/2019 FINDINGS: A feeding tube is noted extending inferiorly into the left to terminate just above the left iliac crest, presumably in a somewhat dilated stomach given the low location. Otherwise unremarkable bowel gas pattern. Clips in the right upper quadrant likely from prior cholecystectomy. IMPRESSION: 1. The feeding tube extends inferiorly into the left abdomen, presumably in a somewhat dilated stomach given the low location. Otherwise unremarkable bowel gas pattern. 2. Status post cholecystectomy. Electronically Signed   By: Van Clines M.D.   On: 07/01/2020 16:08   EEG adult  Result Date: 07/01/2020 Lora Havens, MD     07/01/2020 10:52 AM Patient Name: Mikhail Gebel MRN: [id number] Epilepsy Attending: Lora Havens Referring Physician/Provider: Dr. Rosalin Hawking Date: 07/01/2020 Duration: 24.04 mins Patient history: 65 year old female with recent left MCA stroke status post thrombectomy. EEG to evaluate for seizures. Level of alertness: Awake AEDs during EEG study: None Technical aspects: This EEG study was done with scalp electrodes positioned according to the 10-20 International system of electrode placement. Electrical activity was acquired at a sampling rate of 500Hz  and reviewed with a high frequency filter of 70Hz  and a low frequency filter of 1Hz . EEG data were recorded continuously and digitally stored. Description: The posterior dominant rhythm consists of 8 Hz activity of moderate voltage (25-35 uV) seen predominantly in posterior head regions, symmetric and reactive to eye opening and eye  closing.  EEG showed intermittent generalized and maximal left frontal region 3 to 5 Hz theta-delta slowing.  The 3 to 5 Hz theta and delta slowing in the left frontal region appeared sharply contoured and waxes and wanes without definite evolution.  Hyperventilation and photic stimulation were not performed.   ABNORMALITY -Frontal intermittent rhythmic delta activity, left frontal region -Intermittent slow, generalized and maximal left frontal region IMPRESSION: This study is suggestive of cortical dysfunction in left frontal region likely secondary underlying stroke as well as mild diffuse encephalopathy, nonspecific etiology.  Intermittent rhythmic delta activity in left frontal region is on the ictal-interictal continuum with low to intermediate potential for seizures.  No seizures or epileptiform discharges were seen throughout the recording. Lora Havens   ECHOCARDIOGRAM COMPLETE  Result Date: 07/01/2020    ECHOCARDIOGRAM REPORT   Patient Name:   [facility] Date of Exam: 07/01/2020 Medical Rec #:  [id number]     Height:       62.0 in Accession #:    [id number]    Weight:       207.0 lb Date of Birth:  [birth year]     BSA:          1.940 m Patient  Age:    55 years      BP:           119/64 mmHg Patient Gender: F             HR:           81 bpm. Exam Location:  Inpatient Procedure: 2D Echo Indications:    Stroke I163.9  History:        Patient has no prior history of Echocardiogram examinations.                 Risk Factors:Hypertension.  Sonographer:    Mikki Santee RDCS (AE) Referring Phys: Marlette  1. Left ventricular ejection fraction, by estimation, is 60 to 65%. The left ventricle has normal function. The left ventricle has no regional wall motion abnormalities. There is moderate left ventricular hypertrophy. Left ventricular diastolic parameters are consistent with Grade I diastolic dysfunction (impaired relaxation).  2. Right ventricular systolic function is  normal. The right ventricular size is normal. There is normal pulmonary artery systolic pressure. The estimated right ventricular systolic pressure is [id number] mmHg.  3. The mitral valve is grossly normal. Trivial mitral valve regurgitation.  4. The aortic valve is tricuspid. Aortic valve regurgitation is not visualized.  5. The inferior vena cava is normal in size with greater than 50% respiratory variability, suggesting right atrial pressure of 3 mmHg. Comparison(s): No prior Echocardiogram. FINDINGS  Left Ventricle: Left ventricular ejection fraction, by estimation, is 60 to 65%. The left ventricle has normal function. The left ventricle has no regional wall motion abnormalities. The left ventricular internal cavity size was normal in size. There is  moderate left ventricular hypertrophy. Left ventricular diastolic parameters are consistent with Grade I diastolic dysfunction (impaired relaxation). Indeterminate filling pressures. Right Ventricle: The right ventricular size is normal. No increase in right ventricular wall thickness. Right ventricular systolic function is normal. There is normal pulmonary artery systolic pressure. The tricuspid regurgitant velocity is 2.71 m/s, and  with an assumed right atrial pressure of 3 mmHg, the estimated right ventricular systolic pressure is [id number] mmHg. Left Atrium: Left atrial size was normal in size. Right Atrium: Right atrial size was normal in size. Pericardium: There is no evidence of pericardial effusion. Mitral Valve: The mitral valve is grossly normal. Trivial mitral valve regurgitation. Tricuspid Valve: The tricuspid valve is grossly normal. Tricuspid valve regurgitation is trivial. Aortic Valve: The aortic valve is tricuspid. Aortic valve regurgitation is not visualized. Pulmonic Valve: The pulmonic valve was normal in structure. Pulmonic valve regurgitation is not visualized. Aorta: The aortic root and ascending aorta are structurally normal, with no evidence of  dilitation. Venous: The inferior vena cava is normal in size with greater than 50% respiratory variability, suggesting right atrial pressure of 3 mmHg. IAS/Shunts: No atrial level shunt detected by color flow Doppler.  LEFT VENTRICLE PLAX 2D LVIDd:         3.60 cm  Diastology LVIDs:         2.40 cm  LV e' medial:    6.53 cm/s LV PW:         1.30 cm  LV E/e' medial:  9.4 LV IVS:        1.30 cm  LV e' lateral:   6.85 cm/s LVOT diam:     2.10 cm  LV E/e' lateral: 8.9 LV SV:         86 LV SV Index:   44 LVOT Area:  3.46 cm  RIGHT VENTRICLE RV S prime:     14.40 cm/s TAPSE (M-mode): 2.3 cm LEFT ATRIUM             Index       RIGHT ATRIUM           Index LA diam:        3.40 cm 1.75 cm/m  RA Area:     14.80 cm LA Vol (A2C):   36.6 ml 18.87 ml/m RA Volume:   35.40 ml  18.25 ml/m LA Vol (A4C):   48.9 ml 25.21 ml/m LA Biplane Vol: 44.7 ml 23.04 ml/m  AORTIC VALVE LVOT Vmax:   107.00 cm/s LVOT Vmean:  69.700 cm/s LVOT VTI:    0.248 m  AORTA Ao Root diam: 2.80 cm Ao Asc diam:  3.20 cm MITRAL VALVE               TRICUSPID VALVE MV Area (PHT): 2.87 cm    TR Peak grad:   29.4 mmHg MV Decel Time: 264 msec    TR Vmax:        271.00 cm/s MV E velocity: 61.10 cm/s MV A velocity: 99.90 cm/s  SHUNTS MV E/A ratio:  0.61        Systemic VTI:  0.25 m                            Systemic Diam: 2.10 cm Lyman Bishop MD Electronically signed by Lyman Bishop MD Signature Date/Time: 07/01/2020/1:26:54 PM    Final    CT HEAD CODE STROKE WO CONTRAST  Result Date: 06/30/2020 CLINICAL DATA:  Code stroke. 65 year old female with right side weakness, nonverbal. EXAM: CT HEAD WITHOUT CONTRAST TECHNIQUE: Contiguous axial images were obtained from the base of the skull through the vertex without intravenous contrast. COMPARISON:  [facility] brain MRI 02/15/2016 FINDINGS: Brain: No ventriculomegaly. No midline shift, mass effect, or evidence of intracranial mass lesion. No acute intracranial  hemorrhage identified. Asymmetric left MCA territory indistinct gray and white matter hypodensity compatible with cytotoxic edema (series 2, image 15, 13). Left insula, left M2 and M3 segments affected. Left lentiform also asymmetric on series 2, image 16. Contralateral right hemisphere and posterior fossa gray-white matter differentiation is preserved. There is a small chronic appearing right cerebellar infarct which is new from 2017 (series 2, image 10). No acute intracranial hemorrhage identified. Vascular: Mild Calcified atherosclerosis at the skull base. No suspicious intracranial vascular hyperdensity. Skull: Negative. Sinuses/Orbits: 6 Visualized paranasal sinuses and mastoids are clear. Other: Leftward gaze deviation. Visualized scalp soft tissues are within normal limits. ASPECTS [facility] Stroke Program Early CT Score) - Ganglionic level infarction (caudate, lentiform nuclei, internal capsule, insula, M1-M3 cortex): 3 - Supraganglionic infarction (M4-M6 cortex): 3 Total score (0-10 with 10 being normal): 6 IMPRESSION: 1. Acute Left MCA territory infarct. ASPECTS 6. No hemorrhage or mass effect at this time. 2. The above discussed by telephone with Dr. Curly Shores on 06/30/2020 at 07:32 . 3. 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 Electronically Signed   By: Genevie Ann M.D.   On: 06/30/2020 07:34   VAS [place] LOWER EXTREMITY VENOUS (DVT)  Result Date: 07/01/2020  Lower Venous DVT Study Other Indications: Embolic stroke. Risk Factors: None identified. Comparison Study: No previous exam Performing Technologist: Vonzell Schlatter RVT  Examination Guidelines: A complete evaluation includes B-mode imaging, spectral Doppler, color Doppler, and power  Doppler as needed of all accessible portions of each vessel. Bilateral testing is considered an integral part of a complete examination. Limited examinations for reoccurring indications may be performed as noted. The reflux portion of the exam is  performed with the patient in reverse Trendelenburg.  +---------+---------------+---------+-----------+----------+--------------+  RIGHT     Compressibility Phasicity Spontaneity Properties Thrombus Aging  +---------+---------------+---------+-----------+----------+--------------+  CFV       Full            Yes       Yes                                    +---------+---------------+---------+-----------+----------+--------------+  SFJ       Full                                                             +---------+---------------+---------+-----------+----------+--------------+  FV Prox   Full                                                             +---------+---------------+---------+-----------+----------+--------------+  FV Mid    Full                                                             +---------+---------------+---------+-----------+----------+--------------+  FV Distal Full                                                             +---------+---------------+---------+-----------+----------+--------------+  PFV       Full                                                             +---------+---------------+---------+-----------+----------+--------------+  POP       Full            Yes       Yes                                    +---------+---------------+---------+-----------+----------+--------------+  PTV       Full                                                             +---------+---------------+---------+-----------+----------+--------------+  PERO      Full                                                             +---------+---------------+---------+-----------+----------+--------------+   +---------+---------------+---------+-----------+----------+--------------+  LEFT      Compressibility Phasicity Spontaneity Properties Thrombus Aging  +---------+---------------+---------+-----------+----------+--------------+  CFV       Full            Yes       Yes                                     +---------+---------------+---------+-----------+----------+--------------+  SFJ       Full                                                             +---------+---------------+---------+-----------+----------+--------------+  FV Prox   Full                                                             +---------+---------------+---------+-----------+----------+--------------+  FV Mid    Full                                                             +---------+---------------+---------+-----------+----------+--------------+  FV Distal Full                                                             +---------+---------------+---------+-----------+----------+--------------+  PFV       Full                                                             +---------+---------------+---------+-----------+----------+--------------+  POP       Full            Yes       Yes                                    +---------+---------------+---------+-----------+----------+--------------+  PTV       Full                                                             +---------+---------------+---------+-----------+----------+--------------+  PERO      Full                                                             +---------+---------------+---------+-----------+----------+--------------+     Summary: BILATERAL: - No evidence of deep vein thrombosis seen in the lower extremities, bilaterally. -No evidence of popliteal cyst, bilaterally. RIGHT: - Ultrasound characteristics of a ruptured Baker's Cyst are noted.   *See table(s) above for measurements and observations. Electronically signed by Jamelle Haring on 07/01/2020 at 5:16:40 PM.    Final     PHYSICAL EXAM   GENERAL: She is doing okay at this time.    HEENT: Unremarkable  EXTREMITIES: No edema   BACK: Normal  SKIN: Normal by inspection.    MENTAL STATUS: She is awake and alert.  However, she has clear severe global aphasia.  There is no verbal output.  She does  focuses and tracks however.  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  MOTOR: Right upper extremity 2/5, right lower extremity 1/5.  Left side 4/5.  COORDINATION: No dysmetria or tremors noted.  SENSATION: Reduced sensation to pain on the right side.     MRI is reviewed in person and shows reduced evidence of acute ischemic stroke with increased signal on DWI involving the left temporal frontal area.  There are also similar findings over the right cerebellum.  There is evidence of small to moderate microhemorrhage involving the left temporal region.  Temp:  [98.6 F (37 C)-100.8 F (38.2 C)] 100.8 F (38.2 C) (01/29 0400) Pulse Rate:  [66-97] 95 (01/29 0800) Resp:  [14-28] 23 (01/29 0800) BP: (78-150)/(52-81) 117/61 (01/29 0800) SpO2:  [92 %-100 %] 97 % (01/29 0800) Arterial Line BP: (72-128)/(36-55) 128/55 (01/28 1300) FiO2 (%):  [40 %] 40 % (01/28 0822) Weight:  [93 kg] 93 kg (01/29 0500)  General - Well nourished, well developed, in no apparent distress.  Ophthalmologic - fundi not visualized due to noncooperation.  Cardiovascular - Regular rhythm and rate.  Neuro - awake, eyes open, however, global aphasia, no speech output and not following commands, not able to name and repeat. Left gaze preference but able to gaze to right, tracking bilaterally, however, blinking to visual threat on the left but not on the right. Mild right facial droop. Tongue protrusion not cooperative. LUE no drift but RUE drift but not hit bed within 10 sec. However, seems equal strength BLEs, 3/5 proximal and distally. Sensation, coordination not cooperative and gait  not tested.   ASSESSMENT/PLAN Courtney Burke is a 65 y.o. female with history of hypertension, diabetes, CKD, and obesity status post gastric bypass in 05/2020 admitted for confusion, seizure-like activity, drooling, bowel incontinence, and left gaze and right-sided weakness. No tPA given due to outside window.  Status post IR for left MCA occlusion.  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  Aspects 6  CT head and neck left MCA origin occlusion with some left MCA branch reconstitution.  Severe stenosis right ICA origin, moderate stenosis right M2 origin, mild stenosis right M1 and VB junction  MRI / MRA - Acute infarct left MCA territory involving the basal ganglia as well as the left temporal, frontal, and parietal lobes. Small area of hemorrhage in the left posterior temporal lobe. Additional small areas of acute infarct in the occipital white matter bilaterally and right cerebellum. Left MCA stent appears patent. Moderate intracranial atherosclerotic disease.  2D Echo EF 60 to 65%  LE venous Doppler Negative for DVT  Recommend loop recorder to rule out A. fib on discharge  LDL 68  HgbA1c 5.4  Lovenox for VTE prophylaxis  aspirin 81 mg daily prior to admission, now on aspirin 81 mg daily and Brilinta (ticagrelor) 90 mg bid.   Ongoing aggressive stroke risk factor management  Therapy recommendations: CIR  Disposition: Pending  Seizure like activity  At home with eyes rolling back, unresponsiveness, bowel incontinence, moaning and drooling  EEG Intermittent rhythmic delta activity in left frontal region is on the ictal-interictal continuum with low to intermediate potential for seizures.  We will put on Keppra 500 twice daily  Diabetes  HgbA1c 5.4 goal < 7.0  Controlled  CBG  monitoring  SSI  DM education and close PCP follow up  Hypertension  Stable  BP 120-140 within 24 h of IR  Long term BP goal normotensive  Hyperlipidemia  Home meds: Lipitor 40  LDL 68, goal < 70  Now on Lipitor 80  Continue statin at discharge  Dysphagia  Did not pass a swallow  Keep n.p.o.  Status post core track  IV fluid and tube feeding  Other Stroke Risk Factors  Advanced age  Obesity, Body mass index is 37.5 kg/m.  Status post gastric bypass in 05/2020  Other Active Problems  CKD creatinine 1.3-> 1.44 - IV fluid and tube feeding - 1.47  NPO - tube feeding  Intubated -> extubated 07/01/20  Hypokalemia - potassium - 3.6->3.4->3.2 supplemented -> 3.7  Anemia - Hgb - 11.2->9.9->9.1->7.9 - recheck in AM (considider CT to R/O retroperitoneal bleed if pt reports back pain) .  Check stool for Hemoccult; consider transfusion if drops below 7.  Small area of hemorrhage in the left posterior temporal lobe by MRI 07/01/20. (consider CT scan if neuro changes).  Right now the benefit of dual antiplatelet agents outweigh the risk.  Close follow-up is warranted however.  Hospital day # 2  This patient is critically ill due to stroke post procedure and at significant risk of neurological worsening, death form severe anemia, bleeding, recurrent stroke, intracranial stenosis. This patient's care requires constant monitoring of vital signs, hemodynamics, respiratory and cardiac monitoring, review of multiple databases, neurological assessment, other specialists and medical decision making of high complexity. I spent 40 minutes of neurocritical care time in the care of this patient    Kitai Purdom A. Merlene Laughter, M.D.  Diplomate, Tax adviser of Psychiatry and Neurology ( Neurology). 07/02/2020, 3:09 PM         To contact Stroke Continuity provider, please refer to [email or web address] After hours, contact General Neurology

## 2020-07-02 NOTE — Progress Notes (Addendum)
NAME:  Courtney Burke, MRN:  [id number], DOB:  16-Mar-1956, LOS: 2 ADMISSION DATE:  06/30/2020, CONSULTATION DATE:  06/30/20 REFERRING MD:  Dr. Curly Shores, CHIEF COMPLAINT:  Intubated   Brief History:  65 year old female with a history of hypertension, diabetes, CKD stage III, asthma, vitamin D deficiency, obesity status post gastric sleeve December 2021 presented as code stroke.  Found to have left M1 occlusion and taken for IR thrombectomy.  Status post revascularization and stent placement.  PCCM consulted for vent management.  History of Present Illness:  This is a 65 year old female with a history of hypertension, diabetes, CKD stage III, asthma, vitamin D deficiency, obesity status post gastric sleeve December 2021 presented as code stroke.  Patient is currently intubated and sedated, history obtained by chart review.  Last known normal was last night around 2100, patient was found by her brother, she is standing in her nightgown in bare feet outside staring off into space, was speaking gibberish.  Brother walked patient back into the house and her daughter called 911.  Patient was noted to have her eyes rolling back into her head, had loss of consciousness for a few seconds, and was making moaning sounds and drooling.  A code stroke was called.  On arrival patient was unable to speak and had left gaze preference and right-sided hemiparesis. CT head showed acute left MCA territory infarct, aspect 9, no hemorrhage or mass-effect noted, small chronic appearing right cerebral infarct. Patient was out of window for TPA.  CTA head and neck showed emergent large vessel occlusion of left MCA.  IR performed left common carotid arteriogram with revascularization of occluded left MCA proximal and placement of 2.25x12 mm Onyx resolute balloon mountable stent with TICI 2C revascularization.  Patient was left intubated to protect airway and transferred to 4N.   Past Medical History:  Hypertension, diabetes, CKD stage  III, asthma, vitamin D deficiency, obesity  Significant Hospital Events:  1/27: IR thrombectomy and stent placement, intubated  Consults:  PCCM Neurology Neurosurgery  Procedures:  IR thrombectomy 1/27 ETT 1/27 > 1/28  Significant Diagnostic Tests:  CT head: IMPRESSION: 1. Acute Left MCA territory infarct. ASPECTS 6. No hemorrhage or mass effect at this time. 2. The above discussed by telephone with Dr. Curly Shores on 06/30/2020 at 07:32 . 3. 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  CTA head and neck: IMPRESSION: 1. Positive for emergent large vessel occlusion: Left MCA origin. Some left MCA branch reconstitution.  2. CT Perfusion underestimates infarct core (ASPECTS 6), which is estimated at 11-22 mL, subsequent estimated penumbra of 60 to 67 mL.  3. No other large vessel occlusion. But intracranial atherosclerosis with: - Severe stenosis Right ACA origin. - moderate stenosis Right MCA M2 branch origin. - mild stenosis Right MCA M1, also Vertebrobasilar junction.  4. Cervical carotid atherosclerosis without stenosis.  CT head 1/27: IMPRESSION: Probable few small areas of acute left MCA infarction as before but less apparent. Ill-defined patchy hyperdensity may reflect contrast staining and/or reperfusion petechial hemorrhage. No significant mass effect.  1/28 MR angio head/MR brain: IMPRESSION: Acute infarct left MCA territory involving the basal ganglia as well as the left temporal, frontal, and parietal lobes. Small area of hemorrhage in the left posterior temporal lobe. Additional small areas of acute infarct in the occipital white matter bilaterally and right cerebellum.  Left MCA stent appears patent. Moderate intracranial atherosclerotic disease as above.  Micro Data:    Antimicrobials:  Cefazolin 1/27 preprocedure   Interim  History / Subjective:   No acute events overnight, patient remains aphasic. Respiratory  status has been stable, weaned down to room air. Tmax of 100.8 overnight.   Patient is awake and alert, intermittently will shake head yes and no. Shook head no when asked if she was having pain.   Objective   Blood pressure (!) 112/54, pulse 94, temperature (!) 100.8 F (38.2 C), temperature source Axillary, resp. rate (!) 23, height 5\' 2"  (1.575 m), weight 93 kg, last menstrual period 09/28/2012, SpO2 100 %.    Vent Mode: PSV;CPAP FiO2 (%):  [40 %] 40 % PEEP:  [5 cmH20] 5 cmH20 Pressure Support:  [5 cmH20] 5 cmH20   Intake/Output Summary (Last 24 hours) at 07/02/2020 0757 Last data filed at 07/02/2020 0700 Gross per 24 hour  Intake 1277.86 ml  Output 1625 ml  Net -347.14 ml   Filed Weights   06/30/20 1415 07/02/20 0500  Weight: 93.9 kg 93 kg    Examination: General: Ill appearing female, NAD, sitting up in bed HEENT: Cort[place] in place, AT/[place] Cardiac: RRR, no m/r/g Pulmonary: Normal work of breathing, on RA, mild diffuse rhonchi noted, no wheezing Abdomen: Soft, non-tender, non-distended, normoactive BS Extremity: No LE swelling Neuro: Awake, aphasic, left gaze preference however can track to the right, PERRLA, moving bilateral lower extremities and right arm  Resolved Hospital Problem list     Assessment & Plan:   Acute hypoxic respiratory failure requiring intubation: Febrile state: Intubated for IR thrombectomy and left intubated to protect airway.  S/p extubation 1/28. Doing well from a respiratory standpoint, now down to room air. History of asthma and on albuterol at home. No wheezing noted on exam.  Patient had mild fever to 100.8 overnight. No leukocytosis. Patient with rhonchi noted on exam, given recent episodes of emesis and stroke there is concern for aspiration vs atelectasis. Will obtain CXR today. CMP yesterday showed stable LFTs on admission.   -Resume home albuterol -CXR today  Left MCA LVO s/p left MCA thrombectomy and stent placement: Presented with  decreased responsiveness, loss of consciousness, left gaze preference and right-sided hemiparesis.  Found to have large vessel occlusion of the MCA territory.  Status post thrombectomy and stent placement in left MCA proximal.  Post op CT showed no evidence of intracranial hemorrhage.  Of note it appears that patient has a history of CVA with small chronic appearing right cerebellar infarct on CT.  Neurology on board. Tele monitoring showed evidence of multiple dropped beats, with no prolongation of PR intervals prior to this. EKG showed NSR, with no PR prolongation or dropped beats.    -Neurology on board -Continue Brilenta -ASA 81 mg daily  -Lipitor 80 mg daily -Continue Keppra 500 BID for seizure like activity -Echocardiogram - EF 60-65, normal LV function, G1DD, grossly normal mitral valve, trivial  MV regurgitation, no AV regurgitation -A1C - 5.4 -Lipid panel - cholesterol 143, triglyceride 172, LDL 68 -Tele monitoring  -Neuro recommending loop recorder prior to discharge. -MRA and MRI brain -Acute infarct L MCA territory involving basal ganglia, left temporal,  patent stent in place -PT/OT/SLP - recommending SNF -After discussion with neurology, they recommended monitoring in ICU for now given her sudden drop in hemoglobin  Hypertension Patient is on Norvasc and Hyzaar at home.  Blood pressures ranging around 78/54-150/68.   Goal blood pressure now normotensive. Bps this morning have been normotensive.   -Continue to hold home blood pressure medications -Add labetalol PRN   Normocytic anemia: Hgb down to  7.9 today, down from 11.5 on admission. No evidence of bleeding on exam, with no significant changes in her mental status. She just had her MRA/MRI yesterday which showed a small area of hemorrhage in the left posterior temporal lobe. Discussed with neurology, recommended continuing anti-platelets for now. Vitals remain stable. Her WBC, Hgb, and Plts all decreased from yesterdays labs,  could be from dilution.   -Repeat CBC around 12 -Monitor for signs of bleeding on exam -Transfuse for Hgb <7 -Continue anti-platelets for now   Urinary retention: Required multiple in and outs due to urinary retention, had foley placed 1/28. Consider trial of voiding tomorrow.   Hyperlipidemia -Continue Lipitor  CKD stage III Hypokalemia Cr 1.2 on admission, last Cr noted in chart was 1.59. Cr 1.44 > 1.47. K today 3.7. 2L UOP yesterday. Cr function appears stable, seems consistent with CKD.   -Monitor BMP  Diabetes A1c 5.4 Glucose randing around 100-130s.   -Frequent CBGs -SSI  Obesity  Best practice (evaluated daily)  Diet: Tube feeds Pain/Anxiety/Delirium protocol (if indicated):  VAP protocol (if indicated): Yes DVT prophylaxis: SCDs GI prophylaxis: PPI Glucose control: SSI Mobility: PT/OT Disposition: Remain in ICU for now, consider transfer if Hgb stable  Goals of Care:  Last date of multidisciplinary goals of care discussion: Family and staff present:  Summary of discussion:  Follow up goals of care discussion due:  Code Status: Full  Labs   CBC: Recent Labs  Lab 06/30/20 0710 06/30/20 0719 06/30/20 1405 07/01/20 0513 07/02/20 0715  WBC 6.1  --   --  11.1* 9.5  NEUTROABS 3.6  --   --  9.5*  --   HGB 11.5* 11.2* 9.9* 9.1* 7.9*  HCT 36.3 33.0* 29.0* 27.1* 24.2*  MCV 88.5  --   --  84.4 85.8  PLT 507*  --   --  470* [id number]    Basic Metabolic Panel: Recent Labs  Lab 06/30/20 0710 06/30/20 0719 06/30/20 1405 07/01/20 0513  NA 140 142 142 141  K 3.5 3.6 3.4* 3.2*  CL 111 111  --  113*  CO2 20*  --   --  17*  GLUCOSE 140* 135*  --  138*  BUN 14 17  --  13  CREATININE 1.41* 1.30*  --  1.44*  CALCIUM 9.0  --   --  8.0*   GFR: Estimated Creatinine Clearance: 41.4 mL/min (A) (by C-G formula based on SCr of 1.44 mg/dL (H)). Recent Labs  Lab 06/30/20 0710 07/01/20 0513 07/02/20 0715  WBC 6.1 11.1* 9.5    Liver Function Tests: Recent Labs   Lab 06/30/20 0710 07/01/20 0513  AST 21 17  ALT 15 11  ALKPHOS 101 82  BILITOT 0.5 0.6  PROT 6.4* 5.3*  ALBUMIN 3.4* 2.8*   No results for input(s): LIPASE, AMYLASE in the last 168 hours. No results for input(s): AMMONIA in the last 168 hours.  ABG    Component Value Date/Time   PHART 7.275 (L) 06/30/2020 1405   PCO2ART 33.7 06/30/2020 1405   PO2ART 377 (H) 06/30/2020 1405   HCO3 15.7 (L) 06/30/2020 1405   TCO2 17 (L) 06/30/2020 1405   ACIDBASEDEF 10.0 (H) 06/30/2020 1405   O2SAT 100.0 06/30/2020 1405     Coagulation Profile: Recent Labs  Lab 06/30/20 0710  INR 1.1    Cardiac Enzymes: No results for input(s): CKTOTAL, CKMB, CKMBINDEX, TROPONINI in the last 168 hours.  HbA1C: Hgb A1c MFr Bld  Date/Time Value Ref Range Status  07/01/2020  05:13 AM 5.4 4.8 - 5.6 % Final    Comment:    (NOTE) Pre diabetes:          5.7%-6.4%  Diabetes:              >6.4%  Glycemic control for   <7.0% adults with diabetes     CBG: Recent Labs  Lab 07/01/20 1606 07/01/20 1920 07/01/20 2322 07/02/20 0333 07/02/20 0735  GLUCAP 96 113* 100* 113* 123*    Review of Systems:   Per HPI.   Past Medical History:  She,  has a past medical history of Asthma, Borderline diabetes, and Hypertension.   Surgical History:   Past Surgical History:  Procedure Laterality Date  . ANKLE SURGERY    . CARPAL TUNNEL RELEASE    . carpel tunnel    . RADIOLOGY WITH ANESTHESIA N/A 06/30/2020   Procedure: RADIOLOGY WITH ANESTHESIA;  Surgeon: Radiologist, Medication, MD;  Location: [facility];  Service: Radiology;  Laterality: N/A;     Social History:   reports that she has never smoked. She has never used smokeless tobacco. She reports that she does not drink alcohol and does not use drugs.   Family History:  Her family history is not on file.   Allergies Allergies  Allergen Reactions  . Iodinated Diagnostic Agents Itching  . Doxycycline Nausea And Vomiting  . Sulfa Antibiotics Rash  and Hives  . Codeine Hives and Swelling    Swollen tongue  . Hydrocodone-Homatropine Itching  . Ace Inhibitors Cough, Itching and Rash     Home Medications  Prior to Admission medications   Medication Sig Start Date End Date Taking? Authorizing Provider  albuterol (PROVENTIL HFA;VENTOLIN HFA) 108 (90 BASE) MCG/ACT inhaler Inhale 2 puffs into the lungs every 6 (six) hours as needed for wheezing.    [provider]  allopurinol (ZYLOPRIM) 100 MG tablet Take 100 mg by mouth daily.    [provider]  amLODipine (NORVASC) 10 MG tablet Take 10 mg by mouth daily.    [provider]  aspirin 81 MG tablet Take 81 mg by mouth daily.    [provider]  atorvastatin (LIPITOR) 40 MG tablet Take 40 mg by mouth daily.    [provider]  dicyclomine (BENTYL) 20 MG tablet Take 1 tablet (20 mg total) by mouth 2 (two) times daily as needed (Abdominal pain). 07/08/19   Ward, Ozella Almond, PA-C  linagliptin (TRADJENTA) 5 MG TABS tablet Take 5 mg by mouth daily.    [provider]  losartan-hydrochlorothiazide (HYZAAR) 100-12.5 MG per tablet Take 1 tablet by mouth daily.    [provider]  metFORMIN (GLUCOPHAGE) 500 MG tablet Take 500 mg by mouth 3 (three) times daily.    [provider]  oxybutynin (DITROPAN) 5 MG tablet Take 5 mg by mouth 3 (three) times daily.    [provider]     Critical care time: 35 minutes     Asencion Noble, M.D. [facility] 07/02/2020 7:57 AM

## 2020-07-02 NOTE — Progress Notes (Signed)
Dr. Sherry Ruffing contacted this nurse to inquire about CBC order placed for 1200. This nurse attempted to contact phlebotomy x2 to come draw lab with no answer each time.  Will continue trying to contact phlebotomy.

## 2020-07-03 DIAGNOSIS — I639 Cerebral infarction, unspecified: Secondary | ICD-10-CM | POA: Diagnosis not present

## 2020-07-03 DIAGNOSIS — I63512 Cerebral infarction due to unspecified occlusion or stenosis of left middle cerebral artery: Secondary | ICD-10-CM | POA: Diagnosis not present

## 2020-07-03 LAB — CBC
HCT: 25.2 % — ABNORMAL LOW (ref 36.0–46.0)
Hemoglobin: 8.5 g/dL — ABNORMAL LOW (ref 12.0–15.0)
MCH: 29.3 pg (ref 26.0–34.0)
MCHC: 33.7 g/dL (ref 30.0–36.0)
MCV: 86.9 fL (ref 80.0–100.0)
Platelets: 385 10*3/uL (ref 150–400)
RBC: 2.9 MIL/uL — ABNORMAL LOW (ref 3.87–5.11)
RDW: 14.1 % (ref 11.5–15.5)
WBC: 9.3 10*3/uL (ref 4.0–10.5)
nRBC: 0 % (ref 0.0–0.2)

## 2020-07-03 LAB — BASIC METABOLIC PANEL
Anion gap: 11 (ref 5–15)
BUN: 12 mg/dL (ref 8–23)
CO2: 20 mmol/L — ABNORMAL LOW (ref 22–32)
Calcium: 8.4 mg/dL — ABNORMAL LOW (ref 8.9–10.3)
Chloride: 115 mmol/L — ABNORMAL HIGH (ref 98–111)
Creatinine, Ser: 1.26 mg/dL — ABNORMAL HIGH (ref 0.44–1.00)
GFR, Estimated: 47 mL/min — ABNORMAL LOW (ref >60)
Glucose, Bld: 107 mg/dL — ABNORMAL HIGH (ref 70–99)
Potassium: 3.8 mmol/L (ref 3.5–5.1)
Sodium: 146 mmol/L — ABNORMAL HIGH (ref 135–145)

## 2020-07-03 LAB — GLUCOSE, CAPILLARY
Glucose-Capillary: 103 mg/dL — ABNORMAL HIGH (ref 70–99)
Glucose-Capillary: 100 mg/dL — ABNORMAL HIGH (ref 70–99)
Glucose-Capillary: 108 mg/dL — ABNORMAL HIGH (ref 70–99)
Glucose-Capillary: 94 mg/dL (ref 70–99)
Glucose-Capillary: 97 mg/dL (ref 70–99)
Glucose-Capillary: 91 mg/dL (ref 70–99)

## 2020-07-03 MED ORDER — ASPIRIN 300 MG RE SUPP
300.0000 mg | Freq: Once | RECTAL | Status: AC
  Administered 2020-07-03: 300 mg via RECTAL
  Filled 2020-07-03: qty 1

## 2020-07-03 MED ORDER — FREE WATER
200.0000 mL | Status: DC
  Administered 2020-07-04 – 2020-07-09 (×27): 200 mL

## 2020-07-03 NOTE — Progress Notes (Signed)
Inpatient Rehab Admissions Coordinator  I met with Pt. At bedside to discuss potential CIR admit. Pt. Is aphasic and unable to answer questions. I have called Pt.'s daughter and significant other and left voicemail with request for callback but have not yet received a response.   Clemens Catholic, [place], [place] Admissions Coordinator  [phone] ([place]) [phone] (office)

## 2020-07-03 NOTE — Progress Notes (Signed)
NAME:  Courtney Burke, MRN:  [id number], DOB:  06/15/1955, LOS: 3 ADMISSION DATE:  06/30/2020, CONSULTATION DATE:  06/30/20 REFERRING MD:  Dr. Curly Shores, CHIEF COMPLAINT:  Intubated   Brief History:  65 year old female with a history of hypertension, diabetes, CKD stage III, asthma, vitamin D deficiency, obesity status post gastric sleeve December 2021 presented as code stroke.  Found to have left M1 occlusion and taken for IR thrombectomy.  Status post revascularization and stent placement.  PCCM consulted for vent management.  History of Present Illness:  This is a 65 year old female with a history of hypertension, diabetes, CKD stage III, asthma, vitamin D deficiency, obesity status post gastric sleeve December 2021 presented as code stroke.  Patient is currently intubated and sedated, history obtained by chart review.  Last known normal was last night around 2100, patient was found by her brother, she is standing in her nightgown in bare feet outside staring off into space, was speaking gibberish.  Brother walked patient back into the house and her daughter called 911.  Patient was noted to have her eyes rolling back into her head, had loss of consciousness for a few seconds, and was making moaning sounds and drooling.  A code stroke was called.  On arrival patient was unable to speak and had left gaze preference and right-sided hemiparesis. CT head showed acute left MCA territory infarct, aspect 9, no hemorrhage or mass-effect noted, small chronic appearing right cerebral infarct. Patient was out of window for TPA.  CTA head and neck showed emergent large vessel occlusion of left MCA.  IR performed left common carotid arteriogram with revascularization of occluded left MCA proximal and placement of 2.25x12 mm Onyx resolute balloon mountable stent with TICI 2C revascularization.  Patient was left intubated to protect airway and transferred to 4N.   Past Medical History:  Hypertension, diabetes, CKD stage  III, asthma, vitamin D deficiency, obesity  Significant Hospital Events:  1/27: IR thrombectomy and stent placement, intubated  Consults:  PCCM Neurology Neurosurgery  Procedures:  IR thrombectomy 1/27 ETT 1/27 > 1/28  Significant Diagnostic Tests:  CT head: IMPRESSION: 1. Acute Left MCA territory infarct. ASPECTS 6. No hemorrhage or mass effect at this time. 2. The above discussed by telephone with Dr. Curly Shores on 06/30/2020 at 07:32 . 3. 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  CTA head and neck: IMPRESSION: 1. Positive for emergent large vessel occlusion: Left MCA origin. Some left MCA branch reconstitution.  2. CT Perfusion underestimates infarct core (ASPECTS 6), which is estimated at 11-22 mL, subsequent estimated penumbra of 60 to 67 mL.  3. No other large vessel occlusion. But intracranial atherosclerosis with: - Severe stenosis Right ACA origin. - moderate stenosis Right MCA M2 branch origin. - mild stenosis Right MCA M1, also Vertebrobasilar junction.  4. Cervical carotid atherosclerosis without stenosis.  CT head 1/27: IMPRESSION: Probable few small areas of acute left MCA infarction as before but less apparent. Ill-defined patchy hyperdensity may reflect contrast staining and/or reperfusion petechial hemorrhage. No significant mass effect.  1/28 MR angio head/MR brain: IMPRESSION: Acute infarct left MCA territory involving the basal ganglia as well as the left temporal, frontal, and parietal lobes. Small area of hemorrhage in the left posterior temporal lobe. Additional small areas of acute infarct in the occipital white matter bilaterally and right cerebellum.  Left MCA stent appears patent. Moderate intracranial atherosclerotic disease as above.  1/29 CT Abd/Pelvis 1. Fat stranding in the right inguinal region with a  small retroperitoneal hematoma on the right. 2. Cardiomegaly. 3. Anemia. 4. Bibasilar  atelectasis versus aspiration. 5. Large amount of stool at the level of the rectum.  Micro Data:    Antimicrobials:  Cefazolin 1/27 preprocedure   Interim History / Subjective:   No acute events overnight  Daughter is at the bedside. Answered all questions and concerns.   Patient removed her doboff tube overnight. Discussed need for PEG tube with daughter and neurology stroke service.   CT Abdomen/pelvis showed small RP hematoma, H/H has been stable over past 24 hours. No blood transfusions needed.   Objective   Blood pressure (!) 167/77, pulse 77, temperature 98 F (36.7 C), temperature source Axillary, resp. rate 19, height 5\' 2"  (1.575 m), weight 93.3 kg, last menstrual period 09/28/2012, SpO2 99 %.        Intake/Output Summary (Last 24 hours) at 07/03/2020 1044 Last data filed at 07/03/2020 1000 Gross per 24 hour  Intake 907.5 ml  Output 1950 ml  Net -1042.5 ml   Filed Weights   06/30/20 1415 07/02/20 0500 07/03/20 0500  Weight: 93.9 kg 93 kg 93.3 kg    Examination: General: no acute distress, resting in bed HEENT: [place]/AT, sclera anicteric, moist mucous membranes Cardiac: RRR, no m/r/g Pulmonary: clear to auscultation. No wheezing or rhonchi Abdomen: Soft, non-tender, non-distended, normoactive BS Extremity: No edema, warm Neuro: Awake, aphasic, left gaze preference however can track to the right, PERRL, moving bilateral lower extremities and right arm  Resolved Hospital Problem list   Acute Hypoxic Respiratory Failure  Assessment & Plan:   Left MCA LVO s/p left MCA thrombectomy and stent placement: - Neurology following - Continue Brilenta - ASA 81 mg daily  -Lipitor 80 mg daily -Continue Keppra 500 BID for seizure like activity -Neuro recommending loop recorder prior to discharge. -PT/OT/SLP - recommending SNF - Spoke with IR about PEG tube placement. Given her recent surgery, she is not a candidate for PEG placement. They recommended she would need  general surgery to place a jejunal tube - Will have speech place a cortrak tomorrow  Hypertension Patient is on Norvasc and Hyzaar at home. Goal blood pressure now normotensive.   -Continue to hold home blood pressure medications as we do not have oral access at this time. -Add labetalol PRN   Acute Blood Loss Anemia: Retroperitoneal Hematoma - H/H stable over past 24 hours, no transfusions needed -Transfuse for Hgb <7  Urinary retention: Required multiple in and outs due to urinary retention, had foley placed 1/28.  - Will remove foley today and monitor with bladder scans  Hyperlipidemia -Continue Lipitor  CKD stage III Hypokalemia -Stable, Monitor BMP  Diabetes A1c 5.4 Glucose randing around 100-130s.   -Frequent CBGs -SSI  Obesity  Best practice (evaluated daily)  Diet: No TF due to no doboff tube Pain/Anxiety/Delirium protocol (if indicated):  VAP protocol (if indicated): Yes DVT prophylaxis: SCDs GI prophylaxis: PPI Glucose control: SSI Mobility: PT/OT Disposition: Remain in ICU for now, consider transfer if Hgb stable  Goals of Care:  Last date of multidisciplinary goals of care discussion: 1/30 Family and staff present: Dr. Erin Fulling and daughter Summary of discussion: transfer out of ICU and talk with CM about SNF placement. Follow up goals of care discussion due: 2/6 Code Status: Full  Labs   CBC: Recent Labs  Lab 06/30/20 0710 06/30/20 0719 06/30/20 1405 07/01/20 0513 07/02/20 0715 07/02/20 1341 07/03/20 0534  WBC 6.1  --   --  11.1* 9.5 10.3 9.3  NEUTROABS 3.6  --   --  9.5*  --   --   --   HGB 11.5*   < > 9.9* 9.1* 7.9* 8.2* 8.5*  HCT 36.3   < > 29.0* 27.1* 24.2* 25.2* 25.2*  MCV 88.5  --   --  84.4 85.8 86.3 86.9  PLT 507*  --   --  470* 387 369 385   < > = values in this interval not displayed.    Basic Metabolic Panel: Recent Labs  Lab 06/30/20 0710 06/30/20 0719 06/30/20 1405 07/01/20 0513 07/02/20 0715 07/03/20 0534  NA 140  142 142 141 143 146*  K 3.5 3.6 3.4* 3.2* 3.7 3.8  CL 111 111  --  113* 115* 115*  CO2 20*  --   --  17* 21* 20*  GLUCOSE 140* 135*  --  138* 135* 107*  BUN 14 17  --  13 11 12   CREATININE 1.41* 1.30*  --  1.44* 1.47* 1.26*  CALCIUM 9.0  --   --  8.0* 8.2* 8.4*   GFR: Estimated Creatinine Clearance: 47.4 mL/min (A) (by C-G formula based on SCr of 1.26 mg/dL (H)). Recent Labs  Lab 07/01/20 0513 07/02/20 0715 07/02/20 1341 07/03/20 0534  WBC 11.1* 9.5 10.3 9.3    Liver Function Tests: Recent Labs  Lab 06/30/20 0710 07/01/20 0513  AST 21 17  ALT 15 11  ALKPHOS 101 82  BILITOT 0.5 0.6  PROT 6.4* 5.3*  ALBUMIN 3.4* 2.8*   No results for input(s): LIPASE, AMYLASE in the last 168 hours. No results for input(s): AMMONIA in the last 168 hours.  ABG    Component Value Date/Time   PHART 7.275 (L) 06/30/2020 1405   PCO2ART 33.7 06/30/2020 1405   PO2ART 377 (H) 06/30/2020 1405   HCO3 15.7 (L) 06/30/2020 1405   TCO2 17 (L) 06/30/2020 1405   ACIDBASEDEF 10.0 (H) 06/30/2020 1405   O2SAT 100.0 06/30/2020 1405     Coagulation Profile: Recent Labs  Lab 06/30/20 0710  INR 1.1    Cardiac Enzymes: No results for input(s): CKTOTAL, CKMB, CKMBINDEX, TROPONINI in the last 168 hours.  HbA1C: Hgb A1c MFr Bld  Date/Time Value Ref Range Status  07/01/2020 05:13 AM 5.4 4.8 - 5.6 % Final    Comment:    (NOTE) Pre diabetes:          5.7%-6.4%  Diabetes:              >6.4%  Glycemic control for   <7.0% adults with diabetes     CBG: Recent Labs  Lab 07/02/20 1546 07/02/20 1920 07/02/20 2326 07/03/20 0318 07/03/20 [place]    Freddi Starr, M.D. PGY3 07/03/2020 10:44 AM

## 2020-07-03 NOTE — Progress Notes (Signed)
Attempted to place NG in both nares, patient appeared agitated and traumatized. This nurse made the decision to stop the attempt. Contacted Dr. Erin Fulling who stated he had spoken with patient's daughter and neurology who agreed to have a PEG placed tomorrow via IR. Dr. Erin Fulling gave order to remain NPO until tomorrow.  This nurse brought up the recent gastric sleeve and the compatibility with a PEG. Dr. Erin Fulling stated he would consult IR regarding this.

## 2020-07-03 NOTE — Progress Notes (Signed)
STROKE TEAM PROGRESS NOTE   SUBJECTIVE (INTERVAL HISTORY) The daughters at the bedside.  The case discussed at length with the daughter.  Overall, the patient has not had any deterioration in neurological function.  She pulled her NG tube out but still requires nutritional support and is quite weak on the right side.  We discussed PEG placement with the daughter which she will seems to need at this time.  Repeat hemoglobin is stable.     OBJECTIVE Temp:  [98 F (36.7 C)-100.4 F (38 C)] 98 F (36.7 C) (01/30 0800) Pulse Rate:  [74-96] 87 (01/30 0800) Cardiac Rhythm: Normal sinus rhythm (01/30 0800) Resp:  [16-32] 21 (01/30 0800) BP: (114-167)/(57-77) 167/77 (01/30 0800) SpO2:  [98 %-100 %] 100 % (01/30 0800) Weight:  [93.3 kg] 93.3 kg (01/30 0500)  Recent Labs  Lab 07/02/20 1546 07/02/20 1920 07/02/20 2326 07/03/20 0318 07/03/20 0742  GLUCAP 133* 166* 168* 103* 100*   Recent Labs  Lab 06/30/20 0710 06/30/20 0719 06/30/20 1405 07/01/20 0513 07/02/20 0715 07/03/20 0534  NA 140 142 142 141 143 146*  K 3.5 3.6 3.4* 3.2* 3.7 3.8  CL 111 111  --  113* 115* 115*  CO2 20*  --   --  17* 21* 20*  GLUCOSE 140* 135*  --  138* 135* 107*  BUN 14 17  --  13 11 12   CREATININE 1.41* 1.30*  --  1.44* 1.47* 1.26*  CALCIUM 9.0  --   --  8.0* 8.2* 8.4*   Recent Labs  Lab 06/30/20 0710 07/01/20 0513  AST 21 17  ALT 15 11  ALKPHOS 101 82  BILITOT 0.5 0.6  PROT 6.4* 5.3*  ALBUMIN 3.4* 2.8*   Recent Labs  Lab 06/30/20 0710 06/30/20 0719 06/30/20 1405 07/01/20 0513 07/02/20 0715 07/02/20 1341 07/03/20 0534  WBC 6.1  --   --  11.1* 9.5 10.3 9.3  NEUTROABS 3.6  --   --  9.5*  --   --   --   HGB 11.5*   < > 9.9* 9.1* 7.9* 8.2* 8.5*  HCT 36.3   < > 29.0* 27.1* 24.2* 25.2* 25.2*  MCV 88.5  --   --  84.4 85.8 86.3 86.9  PLT 507*  --   --  470* 387 369 385   < > = values in this interval not displayed.   No results for input(s): CKTOTAL, CKMB, CKMBINDEX, TROPONINI in the last  168 hours. No results for input(s): LABPROT, INR in the last 72 hours. Recent Labs    06/30/20 0856  COLORURINE YELLOW  LABSPEC 1.017  PHURINE 5.0  GLUCOSEU NEGATIVE  HGBUR MODERATE*  BILIRUBINUR NEGATIVE  KETONESUR NEGATIVE  PROTEINUR NEGATIVE  NITRITE NEGATIVE  LEUKOCYTESUR TRACE*       Component Value Date/Time   CHOL 143 07/01/2020 0513   TRIG 172 (H) 07/01/2020 0513   TRIG 164 (H) 07/01/2020 0513   HDL 41 07/01/2020 0513   CHOLHDL 3.5 07/01/2020 0513   VLDL 34 07/01/2020 0513   LDLCALC 68 07/01/2020 0513   Lab Results  Component Value Date   HGBA1C 5.4 07/01/2020      Component Value Date/Time   LABOPIA NONE DETECTED 06/30/2020 0825   COCAINSCRNUR NONE DETECTED 06/30/2020 0825   LABBENZ NONE DETECTED 06/30/2020 0825   AMPHETMU NONE DETECTED 06/30/2020 0825   THCU NONE DETECTED 06/30/2020 0825   LABBARB NONE DETECTED 06/30/2020 0825    No results for input(s): ETH in the last 168 hours.  I have personally reviewed the radiological images below and agree with the radiology interpretations.  CT ABDOMEN PELVIS WO CONTRAST  Result Date: 07/02/2020 CLINICAL DATA:  Retroperitoneal hematoma.  Decreased hemoglobin. EXAM: CT ABDOMEN AND PELVIS WITHOUT CONTRAST TECHNIQUE: Multidetector CT imaging of the abdomen and pelvis was performed following the standard protocol without IV contrast. COMPARISON:  CT dated 07/14/2019 FINDINGS: Lower chest: There is atelectasis versus aspiration at the lung bases.The heart is enlarged. The intracardiac blood pool is hypodense relative to the adjacent myocardium consistent with anemia. Hepatobiliary: The liver is normal. Status post cholecystectomy.There is no biliary ductal dilation. Pancreas: Normal contours without ductal dilatation. No peripancreatic fluid collection. Spleen: Unremarkable. Adrenals/Urinary Tract: --Adrenal glands: Unremarkable. --Right kidney/ureter: No hydronephrosis or radiopaque kidney stones. --Left kidney/ureter: No  hydronephrosis or radiopaque kidney stones. --Urinary bladder: There is a Foley catheter in place. Stomach/Bowel: --Stomach/Duodenum: Patient is status post prior gastric bypass. The enteric tube terminates in the proximal small bowel. --Small bowel: Unremarkable. --Colon: There is a large amount of stool at the level of the rectum. --Appendix: Not visualized. No right lower quadrant inflammation or free fluid. Vascular/Lymphatic: There is fat stranding in the right inguinal region with a small retroperitoneal hematoma on the right. --No retroperitoneal lymphadenopathy. --No mesenteric lymphadenopathy. --No pelvic or inguinal lymphadenopathy. Reproductive: Unremarkable Other: No ascites or free air. There is a small fat containing umbilical hernia. Musculoskeletal. No acute displaced fractures. IMPRESSION: 1. Fat stranding in the right inguinal region with a small retroperitoneal hematoma on the right. 2. Cardiomegaly. 3. Anemia. 4. Bibasilar atelectasis versus aspiration. 5. Large amount of stool at the level of the rectum. Electronically Signed   By: Constance Holster M.D.   On: 07/02/2020 18:17   CT Code Stroke CTA Head W/WO contrast  Result Date: 06/30/2020 CLINICAL DATA:  65 year old female code stroke presentation with left MCA ASPECTS 6. Left gaze deviation. EXAM: CT ANGIOGRAPHY HEAD AND NECK CT PERFUSION BRAIN TECHNIQUE: Multidetector CT imaging of the head and neck was performed using the standard protocol during bolus administration of intravenous contrast. Multiplanar CT image reconstructions and MIPs were obtained to evaluate the vascular anatomy. Carotid stenosis measurements (when applicable) are obtained utilizing NASCET criteria, using the distal internal carotid diameter as the denominator. Multiphase CT imaging of the brain was performed following IV bolus contrast injection. Subsequent parametric perfusion maps were calculated using RAPID software. CONTRAST:  151mL OMNIPAQUE IOHEXOL 350 MG/ML  SOLN COMPARISON:  Plain head CT today 0721 hours. [facility] brain MRI 02/15/2016 FINDINGS: CT Brain Perfusion Findings: ASPECTS: 6 CBF (<30%) Volume: 8mL (erroneous). Using CBF less than 38% 11 mL of parenchyma is detected which does partially correspond to the area of cytotoxic edema by plain CT. Perfusion (Tmax>6.0s) volume: 29mL, hypoperfusion index 0.1 but also might be spurrious. Mismatch Volume: Calculated is not accurate due to erroneously low infarct core, estimated penumbra given the above is 60 to 67 mL. Infarction Location:Left MCA The above was discussed by telephone with Dr. Lesleigh Noe on 06/30/2020 at 07:49 . CTA NECK Skeleton: No acute osseous abnormality identified. Dystrophic/degenerative calcifications of the longus coli muscle insertion at C1-C2. Upper chest: Negative. Other neck: No acute finding. Incidental contrast reflux into a left posterior neck vein. Aortic arch: Slightly bovine arch configuration. No arch atherosclerosis. Right carotid system: Mildly tortuous proximal right CCA. Moderate calcified plaque at the right ICA origin with less than 50 % stenosis with respect to the distal vessel. Mild tortuosity. Left carotid system:  Patent, mildly tortuous left CCA. Mild to moderate calcified plaque at the posterior left ICA origin with less than 50 % stenosis with respect to the distal vessel. Mildly tortuous. Vertebral arteries: Negative proximal right subclavian artery and right vertebral artery origin. The right vertebral is mildly tortuous but patent to the skull base without stenosis. Moderate soft more than calcified plaque in the proximal left subclavian artery although no significant stenosis. Left vertebral artery origin remains normal. Codominant left vertebral artery is patent to the skull base without stenosis. CTA HEAD Posterior circulation: Mild right V4 calcified plaque. Patent distal vertebral arteries to the basilar with mild  stenosis at the vertebrobasilar junction. Diminutive PICA. Left AICA appears dominant. Patent basilar artery is diminutive but without stenosis. Patent SCA origins with fetal type bilateral PCA origins. Patent basilar tip. Bilateral PCA branches are within normal limits. Anterior circulation: Both ICA siphons are patent. No significant siphon plaque or stenosis. Normal posterior communicating artery origins. Patent carotid termini although the left MCA origin is occluded (series 10, image 19). There is some left MCA branch reconstitution as seen on series 13, image 43. Left ACA origin is normal. There is severe stenosis at the right ACA origin. Anterior communicating artery is diminutive. Bilateral A2 branches appear symmetric and within normal limits. Right MCA origin is patent but also irregular with mild stenosis. Right MCA M1 and right MCA bifurcation are patent, although with moderate stenosis at the posterior right MCA M2 origin (series 12, image 10). No right MCA branch occlusion identified. Venous sinuses: Early contrast timing, grossly patent. Anatomic variants: Fetal type PCA origins. Review of the MIP images confirms the above findings IMPRESSION: 1. Positive for emergent large vessel occlusion: Left MCA origin. Some left MCA branch reconstitution. 2. CT Perfusion underestimates infarct core (ASPECTS 6), which is estimated at 11-22 mL, subsequent estimated penumbra of 60 to 67 mL. 3. No other large vessel occlusion. But intracranial atherosclerosis with: - Severe stenosis Right ACA origin. - moderate stenosis Right MCA M2 branch origin. - mild stenosis Right MCA M1, also Vertebrobasilar junction. 4. Cervical carotid atherosclerosis without stenosis. #1 discussed by telephone with Dr. Lesleigh Noe on 06/30/2020 at 0732 hours. And [facility] CTP findings discussed at 0749 hours. Electronically Signed   By: Genevie Ann M.D.   On: 06/30/2020 07:58   CT HEAD WO CONTRAST  Result Date: 07/02/2020 CLINICAL DATA:   Stroke follow-up.  Intracranial hemorrhage. EXAM: CT HEAD WITHOUT CONTRAST TECHNIQUE: Contiguous axial images were obtained from the base of the skull through the vertex without intravenous contrast. COMPARISON:  CT head 06/30/2020, MRI head 07/01/2020 FINDINGS: Brain: Left MCA infarct with hypodensity in the left temporal and parietal lobe as well as in the left basal ganglia involving the caudate and putamen. Small 1 cm hemorrhage left temporal lobe unchanged from prior CT and MRI. Small focus of subarachnoid hemorrhage in the left lateral temporal lobe which was present on the prior CT. Ventricle size normal. No midline shift. No new area of infarction. Vascular: Negative for hyperdense vessel Skull: Negative Sinuses/Orbits: Paranasal sinuses clear. NG tube in place. Negative orbit. Other: None IMPRESSION: Acute left MCA infarct unchanged from prior studies. Small amount of hemorrhage left temporal lobe also unchanged. No new area of hemorrhage or infarction. Electronically Signed   By: Franchot Gallo M.D.   On: 07/02/2020 17:49   CT HEAD WO CONTRAST  Result Date: 06/30/2020 CLINICAL DATA:  Stroke, left MCA occlusion post thrombectomy and stenting EXAM: CT HEAD WITHOUT CONTRAST TECHNIQUE:  Contiguous axial images were obtained from the base of the skull through the vertex without intravenous contrast. COMPARISON:  Earlier same day FINDINGS: Brain: Probable few patchy areas of loss of gray-white differentiation are identified in the left MCA territory. This is less apparent than on the prior study. There is some ill-defined patchy hyperdensity, most discrete in the superior left temporal lobe on axial series 3, image 14. There is no significant mass effect. No hydrocephalus. Small chronic right cerebellar infarct. Vascular: New stent of the left M1 MCA. Skull: Calvarium is unremarkable. Sinuses/Orbits: No acute finding. Other: None. IMPRESSION: Probable few small areas of acute left MCA infarction as before but  less apparent. Ill-defined patchy hyperdensity may reflect contrast staining and/or reperfusion petechial hemorrhage. No significant mass effect. Electronically Signed   By: Macy Mis M.D.   On: 06/30/2020 16:41   CT Code Stroke CTA Neck W/WO contrast  Result Date: 06/30/2020 CLINICAL DATA:  65 year old female code stroke presentation with left MCA ASPECTS 6. Left gaze deviation. EXAM: CT ANGIOGRAPHY HEAD AND NECK CT PERFUSION BRAIN TECHNIQUE: Multidetector CT imaging of the head and neck was performed using the standard protocol during bolus administration of intravenous contrast. Multiplanar CT image reconstructions and MIPs were obtained to evaluate the vascular anatomy. Carotid stenosis measurements (when applicable) are obtained utilizing NASCET criteria, using the distal internal carotid diameter as the denominator. Multiphase CT imaging of the brain was performed following IV bolus contrast injection. Subsequent parametric perfusion maps were calculated using RAPID software. CONTRAST:  188mL OMNIPAQUE IOHEXOL 350 MG/ML SOLN COMPARISON:  Plain head CT today 0721 hours. [facility] brain MRI 02/15/2016 FINDINGS: CT Brain Perfusion Findings: ASPECTS: 6 CBF (<30%) Volume: 60mL (erroneous). Using CBF less than 38% 11 mL of parenchyma is detected which does partially correspond to the area of cytotoxic edema by plain CT. Perfusion (Tmax>6.0s) volume: 78mL, hypoperfusion index 0.1 but also might be spurrious. Mismatch Volume: Calculated is not accurate due to erroneously low infarct core, estimated penumbra given the above is 60 to 67 mL. Infarction Location:Left MCA The above was discussed by telephone with Dr. Lesleigh Noe on 06/30/2020 at 07:49 . CTA NECK Skeleton: No acute osseous abnormality identified. Dystrophic/degenerative calcifications of the longus coli muscle insertion at C1-C2. Upper chest: Negative. Other neck: No acute finding. Incidental contrast  reflux into a left posterior neck vein. Aortic arch: Slightly bovine arch configuration. No arch atherosclerosis. Right carotid system: Mildly tortuous proximal right CCA. Moderate calcified plaque at the right ICA origin with less than 50 % stenosis with respect to the distal vessel. Mild tortuosity. Left carotid system: Patent, mildly tortuous left CCA. Mild to moderate calcified plaque at the posterior left ICA origin with less than 50 % stenosis with respect to the distal vessel. Mildly tortuous. Vertebral arteries: Negative proximal right subclavian artery and right vertebral artery origin. The right vertebral is mildly tortuous but patent to the skull base without stenosis. Moderate soft more than calcified plaque in the proximal left subclavian artery although no significant stenosis. Left vertebral artery origin remains normal. Codominant left vertebral artery is patent to the skull base without stenosis. CTA HEAD Posterior circulation: Mild right V4 calcified plaque. Patent distal vertebral arteries to the basilar with mild stenosis at the vertebrobasilar junction. Diminutive PICA. Left AICA appears dominant. Patent basilar artery is diminutive but without stenosis. Patent SCA origins with fetal type bilateral PCA origins. Patent basilar tip. Bilateral PCA branches are within normal limits. Anterior circulation: Both  ICA siphons are patent. No significant siphon plaque or stenosis. Normal posterior communicating artery origins. Patent carotid termini although the left MCA origin is occluded (series 10, image 19). There is some left MCA branch reconstitution as seen on series 13, image 43. Left ACA origin is normal. There is severe stenosis at the right ACA origin. Anterior communicating artery is diminutive. Bilateral A2 branches appear symmetric and within normal limits. Right MCA origin is patent but also irregular with mild stenosis. Right MCA M1 and right MCA bifurcation are patent, although with  moderate stenosis at the posterior right MCA M2 origin (series 12, image 10). No right MCA branch occlusion identified. Venous sinuses: Early contrast timing, grossly patent. Anatomic variants: Fetal type PCA origins. Review of the MIP images confirms the above findings IMPRESSION: 1. Positive for emergent large vessel occlusion: Left MCA origin. Some left MCA branch reconstitution. 2. CT Perfusion underestimates infarct core (ASPECTS 6), which is estimated at 11-22 mL, subsequent estimated penumbra of 60 to 67 mL. 3. No other large vessel occlusion. But intracranial atherosclerosis with: - Severe stenosis Right ACA origin. - moderate stenosis Right MCA M2 branch origin. - mild stenosis Right MCA M1, also Vertebrobasilar junction. 4. Cervical carotid atherosclerosis without stenosis. #1 discussed by telephone with Dr. Lesleigh Noe on 06/30/2020 at 0732 hours. And [facility]ient CTP findings discussed at 0749 hours. Electronically Signed   By: Genevie Ann M.D.   On: 06/30/2020 07:58   MR ANGIO HEAD WO CONTRAST  Result Date: 07/01/2020 CLINICAL DATA:  Stroke.  Post left MCA thrombectomy. EXAM: MRI HEAD WITHOUT CONTRAST MRA HEAD WITHOUT CONTRAST TECHNIQUE: Multiplanar, multiecho pulse sequences of the brain and surrounding structures were obtained without intravenous contrast. Angiographic images of the head were obtained using MRA technique without contrast. COMPARISON:  CT head 06/30/2020.  CTA head 06/30/2020 FINDINGS: MRI HEAD FINDINGS Brain: Acute infarct left MCA territory. There is involvement of the left basal ganglia including the caudate and putamen. There is infarct involving the superior left temporal lobe as well as the insula and the left parietal lobe. Small area of hemorrhage is present within the left posterior temporal lobe measuring approximately 1 cm. This was hyperdense on CT yesterday. Scattered small areas of acute infarct in the left frontal lobe. Small area of acute infarct in the right cerebellum  and in the occipital white matter bilaterally. Ventricle size normal.  No midline shift.  No mass lesion. Vascular: Normal arterial flow voids Skull and upper cervical spine: Negative Sinuses/Orbits: Mild mucosal edema paranasal sinuses. Negative orbit Other: None MRA HEAD FINDINGS Decreased signal in the carotid bilaterally at the skull base is felt to be artifact. This area appears widely patent on CTA. Decreased signal left cavernous carotid also artifact. Both cavernous carotids are widely patent on CTA. Moderate stenosis proximal right A1 segment. Mild stenosis right M1. Moderate stenosis right MCA bifurcation. Interval placement of stent in the left middle cerebral artery. There is flow related signal in left MCA branches indicating the stent is patent. Both vertebral arteries patent to the basilar. Basilar widely patent. Right PICA patent. Left AICA patent. Superior cerebellar and posterior cerebral arteries patent bilaterally. Fetal origin left posterior cerebral artery. Right posterior communicating artery is patent. IMPRESSION: Acute infarct left MCA territory involving the basal ganglia as well as the left temporal, frontal, and parietal lobes. Small area of hemorrhage in the left posterior temporal lobe. Additional small areas of acute infarct in the occipital white matter bilaterally and right cerebellum. Left MCA stent  appears patent. Moderate intracranial atherosclerotic disease as above. Electronically Signed   By: Franchot Gallo M.D.   On: 07/01/2020 18:55   MR BRAIN WO CONTRAST  Result Date: 07/01/2020 CLINICAL DATA:  Stroke.  Post left MCA thrombectomy. EXAM: MRI HEAD WITHOUT CONTRAST MRA HEAD WITHOUT CONTRAST TECHNIQUE: Multiplanar, multiecho pulse sequences of the brain and surrounding structures were obtained without intravenous contrast. Angiographic images of the head were obtained using MRA technique without contrast. COMPARISON:  CT head 06/30/2020.  CTA head 06/30/2020 FINDINGS: MRI  HEAD FINDINGS Brain: Acute infarct left MCA territory. There is involvement of the left basal ganglia including the caudate and putamen. There is infarct involving the superior left temporal lobe as well as the insula and the left parietal lobe. Small area of hemorrhage is present within the left posterior temporal lobe measuring approximately 1 cm. This was hyperdense on CT yesterday. Scattered small areas of acute infarct in the left frontal lobe. Small area of acute infarct in the right cerebellum and in the occipital white matter bilaterally. Ventricle size normal.  No midline shift.  No mass lesion. Vascular: Normal arterial flow voids Skull and upper cervical spine: Negative Sinuses/Orbits: Mild mucosal edema paranasal sinuses. Negative orbit Other: None MRA HEAD FINDINGS Decreased signal in the carotid bilaterally at the skull base is felt to be artifact. This area appears widely patent on CTA. Decreased signal left cavernous carotid also artifact. Both cavernous carotids are widely patent on CTA. Moderate stenosis proximal right A1 segment. Mild stenosis right M1. Moderate stenosis right MCA bifurcation. Interval placement of stent in the left middle cerebral artery. There is flow related signal in left MCA branches indicating the stent is patent. Both vertebral arteries patent to the basilar. Basilar widely patent. Right PICA patent. Left AICA patent. Superior cerebellar and posterior cerebral arteries patent bilaterally. Fetal origin left posterior cerebral artery. Right posterior communicating artery is patent. IMPRESSION: Acute infarct left MCA territory involving the basal ganglia as well as the left temporal, frontal, and parietal lobes. Small area of hemorrhage in the left posterior temporal lobe. Additional small areas of acute infarct in the occipital white matter bilaterally and right cerebellum. Left MCA stent appears patent. Moderate intracranial atherosclerotic disease as above. Electronically  Signed   By: Franchot Gallo M.D.   On: 07/01/2020 18:55   CT Code Stroke Cerebral Perfusion with contrast  Result Date: 06/30/2020 CLINICAL DATA:  65 year old female code stroke presentation with left MCA ASPECTS 6. Left gaze deviation. EXAM: CT ANGIOGRAPHY HEAD AND NECK CT PERFUSION BRAIN TECHNIQUE: Multidetector CT imaging of the head and neck was performed using the standard protocol during bolus administration of intravenous contrast. Multiplanar CT image reconstructions and MIPs were obtained to evaluate the vascular anatomy. Carotid stenosis measurements (when applicable) are obtained utilizing NASCET criteria, using the distal internal carotid diameter as the denominator. Multiphase CT imaging of the brain was performed following IV bolus contrast injection. Subsequent parametric perfusion maps were calculated using RAPID software. CONTRAST:  185mL OMNIPAQUE IOHEXOL 350 MG/ML SOLN COMPARISON:  Plain head CT today 0721 hours. [facility] brain MRI 02/15/2016 FINDINGS: CT Brain Perfusion Findings: ASPECTS: 6 CBF (<30%) Volume: 16mL (erroneous). Using CBF less than 38% 11 mL of parenchyma is detected which does partially correspond to the area of cytotoxic edema by plain CT. Perfusion (Tmax>6.0s) volume: 45mL, hypoperfusion index 0.1 but also might be spurrious. Mismatch Volume: Calculated is not accurate due to erroneously low infarct core, estimated penumbra given  the above is 60 to 67 mL. Infarction Location:Left MCA The above was discussed by telephone with Dr. Lesleigh Noe on 06/30/2020 at 07:49 . CTA NECK Skeleton: No acute osseous abnormality identified. Dystrophic/degenerative calcifications of the longus coli muscle insertion at C1-C2. Upper chest: Negative. Other neck: No acute finding. Incidental contrast reflux into a left posterior neck vein. Aortic arch: Slightly bovine arch configuration. No arch atherosclerosis. Right carotid system: Mildly tortuous  proximal right CCA. Moderate calcified plaque at the right ICA origin with less than 50 % stenosis with respect to the distal vessel. Mild tortuosity. Left carotid system: Patent, mildly tortuous left CCA. Mild to moderate calcified plaque at the posterior left ICA origin with less than 50 % stenosis with respect to the distal vessel. Mildly tortuous. Vertebral arteries: Negative proximal right subclavian artery and right vertebral artery origin. The right vertebral is mildly tortuous but patent to the skull base without stenosis. Moderate soft more than calcified plaque in the proximal left subclavian artery although no significant stenosis. Left vertebral artery origin remains normal. Codominant left vertebral artery is patent to the skull base without stenosis. CTA HEAD Posterior circulation: Mild right V4 calcified plaque. Patent distal vertebral arteries to the basilar with mild stenosis at the vertebrobasilar junction. Diminutive PICA. Left AICA appears dominant. Patent basilar artery is diminutive but without stenosis. Patent SCA origins with fetal type bilateral PCA origins. Patent basilar tip. Bilateral PCA branches are within normal limits. Anterior circulation: Both ICA siphons are patent. No significant siphon plaque or stenosis. Normal posterior communicating artery origins. Patent carotid termini although the left MCA origin is occluded (series 10, image 19). There is some left MCA branch reconstitution as seen on series 13, image 43. Left ACA origin is normal. There is severe stenosis at the right ACA origin. Anterior communicating artery is diminutive. Bilateral A2 branches appear symmetric and within normal limits. Right MCA origin is patent but also irregular with mild stenosis. Right MCA M1 and right MCA bifurcation are patent, although with moderate stenosis at the posterior right MCA M2 origin (series 12, image 10). No right MCA branch occlusion identified. Venous sinuses: Early contrast timing,  grossly patent. Anatomic variants: Fetal type PCA origins. Review of the MIP images confirms the above findings IMPRESSION: 1. Positive for emergent large vessel occlusion: Left MCA origin. Some left MCA branch reconstitution. 2. CT Perfusion underestimates infarct core (ASPECTS 6), which is estimated at 11-22 mL, subsequent estimated penumbra of 60 to 67 mL. 3. No other large vessel occlusion. But intracranial atherosclerosis with: - Severe stenosis Right ACA origin. - moderate stenosis Right MCA M2 branch origin. - mild stenosis Right MCA M1, also Vertebrobasilar junction. 4. Cervical carotid atherosclerosis without stenosis. #1 discussed by telephone with Dr. Lesleigh Noe on 06/30/2020 at 0732 hours. And [facility] CTP findings discussed at 0749 hours. Electronically Signed   By: Genevie Ann M.D.   On: 06/30/2020 07:58   DG CHEST PORT 1 VIEW  Result Date: 07/02/2020 CLINICAL DATA:  Fever, shortness of breath, hypertension. Status post LEFT MCA thrombectomy. EXAM: PORTABLE CHEST 1 VIEW COMPARISON:  Chest x-ray dated 06/30/2020. FINDINGS: Endotracheal tube has been removed. Enteric tube passes below the diaphragm. Heart size and mediastinal contours appear stable. Lungs are clear. No pleural effusion or pneumothorax is seen. IMPRESSION: No active disease. No evidence of pneumonia or pulmonary edema. Electronically Signed   By: Franki Cabot M.D.   On: 07/02/2020 11:01   Portable Chest x-ray  Result Date: 06/30/2020 CLINICAL DATA:  Evaluate endotracheal tube placement EXAM: PORTABLE CHEST 1 VIEW COMPARISON:  09/17/2019 FINDINGS: The tip of the ET tube appears oriented towards the left mainstem bronchus. Consider withdrawing by 2 cm. The enteric tube tip is in the proximal stomach. The side port for the enteric tube is just above the GE junction. Heart size normal. No pleural effusion, interstitial edema or airspace consolidation. IMPRESSION: 1. ET tube tip appears oriented towards the left mainstem bronchus.  Consider withdrawing by 2 cm. 2. Consider advancement of enteric tube. 3. Lungs appear clear. 4. These results will be called to the ordering clinician or representative by the Radiologist Assistant, and communication documented in the PACS or [employer] Dashboard. Electronically Signed   By: Signa Kellaylor  Stroud M.D.   On: 06/30/2020 13:59   DG Abd Portable 1V  Result Date: 07/01/2020 CLINICAL DATA:  Feeding tube placement EXAM: PORTABLE ABDOMEN - 1 VIEW COMPARISON:  CT abdomen 07/14/2019 FINDINGS: A feeding tube is noted extending inferiorly into the left to terminate just above the left iliac crest, presumably in a somewhat dilated stomach given the low location. Otherwise unremarkable bowel gas pattern. Clips in the right upper quadrant likely from prior cholecystectomy. IMPRESSION: 1. The feeding tube extends inferiorly into the left abdomen, presumably in a somewhat dilated stomach given the low location. Otherwise unremarkable bowel gas pattern. 2. Status post cholecystectomy. Electronically Signed   By: Gaylyn RongWalter  Liebkemann M.D.   On: 07/01/2020 16:08   EEG adult  Result Date: 07/01/2020 Charlsie QuestYadav, Priyanka O, MD     07/01/2020 10:52 AM Patient Name: Doyne KeelDeborah Skelton MRN: [id number] Epilepsy Attending: Charlsie QuestPriyanka O Yadav Referring Physician/Provider: Dr. Marvel PlanJindong Xu Date: 07/01/2020 Duration: 24.04 mins Patient history: 65 year old female with recent left MCA stroke status post thrombectomy. EEG to evaluate for seizures. Level of alertness: Awake AEDs during EEG study: None Technical aspects: This EEG study was done with scalp electrodes positioned according to the 10-20 International system of electrode placement. Electrical activity was acquired at a sampling rate of 500Hz  and reviewed with a high frequency filter of 70Hz  and a low frequency filter of 1Hz . EEG data were recorded continuously and digitally stored. Description: The posterior dominant rhythm consists of 8 Hz activity of moderate voltage (25-35 uV) seen  predominantly in posterior head regions, symmetric and reactive to eye opening and eye closing.  EEG showed intermittent generalized and maximal left frontal region 3 to 5 Hz theta-delta slowing.  The 3 to 5 Hz theta and delta slowing in the left frontal region appeared sharply contoured and waxes and wanes without definite evolution.  Hyperventilation and photic stimulation were not performed.   ABNORMALITY -Frontal intermittent rhythmic delta activity, left frontal region -Intermittent slow, generalized and maximal left frontal region IMPRESSION: This study is suggestive of cortical dysfunction in left frontal region likely secondary underlying stroke as well as mild diffuse encephalopathy, nonspecific etiology.  Intermittent rhythmic delta activity in left frontal region is on the ictal-interictal continuum with low to intermediate potential for seizures.  No seizures or epileptiform discharges were seen throughout the recording. Charlsie Questriyanka O Yadav   ECHOCARDIOGRAM COMPLETE  Result Date: 07/01/2020    ECHOCARDIOGRAM REPORT   [facility] Date of Exam: 07/01/2020 Medical Rec #:  [id number]     Height:       62.0 in Accession #:    [id number]    Weight:       207.0 lb Date of Birth:  05-01-1956     BSA:  1.940 m Patient Age:    45 years      BP:           119/64 mmHg Patient Gender: F             HR:           81 bpm. Exam Location:  Inpatient Procedure: 2D Echo Indications:    Stroke I163.9  History:        Patient has no prior history of Echocardiogram examinations.                 Risk Factors:Hypertension.  Sonographer:    Mikki Santee RDCS (AE) Referring Phys: [place]  1. Left ventricular ejection fraction, by estimation, is 60 to 65%. The left ventricle has normal function. The left ventricle has no regional wall motion abnormalities. There is moderate left ventricular hypertrophy. Left ventricular diastolic parameters are consistent with Grade I  diastolic dysfunction (impaired relaxation).  2. Right ventricular systolic function is normal. The right ventricular size is normal. There is normal pulmonary artery systolic pressure. The estimated right ventricular systolic pressure is [id number] mmHg.  3. The mitral valve is grossly normal. Trivial mitral valve regurgitation.  4. The aortic valve is tricuspid. Aortic valve regurgitation is not visualized.  5. The inferior vena cava is normal in size with greater than 50% respiratory variability, suggesting right atrial pressure of 3 mmHg. Comparison(s): No prior Echocardiogram. FINDINGS  Left Ventricle: Left ventricular ejection fraction, by estimation, is 60 to 65%. The left ventricle has normal function. The left ventricle has no regional wall motion abnormalities. The left ventricular internal cavity size was normal in size. There is  moderate left ventricular hypertrophy. Left ventricular diastolic parameters are consistent with Grade I diastolic dysfunction (impaired relaxation). Indeterminate filling pressures. Right Ventricle: The right ventricular size is normal. No increase in right ventricular wall thickness. Right ventricular systolic function is normal. There is normal pulmonary artery systolic pressure. The tricuspid regurgitant velocity is 2.71 m/s, and  with an assumed right atrial pressure of 3 mmHg, the estimated right ventricular systolic pressure is [id number] mmHg. Left Atrium: Left atrial size was normal in size. Right Atrium: Right atrial size was normal in size. Pericardium: There is no evidence of pericardial effusion. Mitral Valve: The mitral valve is grossly normal. Trivial mitral valve regurgitation. Tricuspid Valve: The tricuspid valve is grossly normal. Tricuspid valve regurgitation is trivial. Aortic Valve: The aortic valve is tricuspid. Aortic valve regurgitation is not visualized. Pulmonic Valve: The pulmonic valve was normal in structure. Pulmonic valve regurgitation is not visualized.  Aorta: The aortic root and ascending aorta are structurally normal, with no evidence of dilitation. Venous: The inferior vena cava is normal in size with greater than 50% respiratory variability, suggesting right atrial pressure of 3 mmHg. IAS/Shunts: No atrial level shunt detected by color flow Doppler.  LEFT VENTRICLE PLAX 2D LVIDd:         3.60 cm  Diastology LVIDs:         2.40 cm  LV e' medial:    6.53 cm/s LV PW:         1.30 cm  LV E/e' medial:  9.4 LV IVS:        1.30 cm  LV e' lateral:   6.85 cm/s LVOT diam:     2.10 cm  LV E/e' lateral: 8.9 LV SV:         86 LV SV Index:   44 LVOT Area:  3.46 cm  RIGHT VENTRICLE RV S prime:     14.40 cm/s TAPSE (M-mode): 2.3 cm LEFT ATRIUM             Index       RIGHT ATRIUM           Index LA diam:        3.40 cm 1.75 cm/m  RA Area:     14.80 cm LA Vol (A2C):   36.6 ml 18.87 ml/m RA Volume:   35.40 ml  18.25 ml/m LA Vol (A4C):   48.9 ml 25.21 ml/m LA Biplane Vol: 44.7 ml 23.04 ml/m  AORTIC VALVE LVOT Vmax:   107.00 cm/s LVOT Vmean:  69.700 cm/s LVOT VTI:    0.248 m  AORTA Ao Root diam: 2.80 cm Ao Asc diam:  3.20 cm MITRAL VALVE               TRICUSPID VALVE MV Area (PHT): 2.87 cm    TR Peak grad:   29.4 mmHg MV Decel Time: 264 msec    TR Vmax:        271.00 cm/s MV E velocity: 61.10 cm/s MV A velocity: 99.90 cm/s  SHUNTS MV E/A ratio:  0.61        Systemic VTI:  0.25 m                            Systemic Diam: 2.10 cm Lyman Bishop MD Electronically signed by Lyman Bishop MD Signature Date/Time: 07/01/2020/1:26:54 PM    Final    CT HEAD CODE STROKE WO CONTRAST  Result Date: 06/30/2020 CLINICAL DATA:  Code stroke. 65 year old female with right side weakness, nonverbal. EXAM: CT HEAD WITHOUT CONTRAST TECHNIQUE: Contiguous axial images were obtained from the base of the skull through the vertex without intravenous contrast. COMPARISON:  [facility] brain MRI 02/15/2016 FINDINGS: Brain: No ventriculomegaly. No midline  shift, mass effect, or evidence of intracranial mass lesion. No acute intracranial hemorrhage identified. Asymmetric left MCA territory indistinct gray and white matter hypodensity compatible with cytotoxic edema (series 2, image 15, 13). Left insula, left M2 and M3 segments affected. Left lentiform also asymmetric on series 2, image 16. Contralateral right hemisphere and posterior fossa gray-white matter differentiation is preserved. There is a small chronic appearing right cerebellar infarct which is new from 2017 (series 2, image 10). No acute intracranial hemorrhage identified. Vascular: Mild Calcified atherosclerosis at the skull base. No suspicious intracranial vascular hyperdensity. Skull: Negative. Sinuses/Orbits: 6 Visualized paranasal sinuses and mastoids are clear. Other: Leftward gaze deviation. Visualized scalp soft tissues are within normal limits. ASPECTS [facility] Stroke Program Early CT Score) - Ganglionic level infarction (caudate, lentiform nuclei, internal capsule, insula, M1-M3 cortex): 3 - Supraganglionic infarction (M4-M6 cortex): 3 Total score (0-10 with 10 being normal): 6 IMPRESSION: 1. Acute Left MCA territory infarct. ASPECTS 6. No hemorrhage or mass effect at this time. 2. The above discussed by telephone with Dr. Curly Shores on 06/30/2020 at 07:32 . 3. 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 Electronically Signed   By: Genevie Ann M.D.   On: 06/30/2020 07:34   VAS [place] LOWER EXTREMITY VENOUS (DVT)  Result Date: 07/01/2020  Lower Venous DVT Study Other Indications: Embolic stroke. Risk Factors: None identified. Comparison Study: No previous exam Performing Technologist: Vonzell Schlatter RVT  Examination Guidelines: A complete evaluation includes B-mode imaging, spectral Doppler, color Doppler, and power  Doppler as needed of all accessible portions of each vessel. Bilateral testing is considered an integral part of a complete examination. Limited examinations for  reoccurring indications may be performed as noted. The reflux portion of the exam is performed with the patient in reverse Trendelenburg.  +---------+---------------+---------+-----------+----------+--------------+ RIGHT    CompressibilityPhasicitySpontaneityPropertiesThrombus Aging +---------+---------------+---------+-----------+----------+--------------+ CFV      Full           Yes      Yes                                 +---------+---------------+---------+-----------+----------+--------------+ SFJ      Full                                                        +---------+---------------+---------+-----------+----------+--------------+ FV Prox  Full                                                        +---------+---------------+---------+-----------+----------+--------------+ FV Mid   Full                                                        +---------+---------------+---------+-----------+----------+--------------+ FV DistalFull                                                        +---------+---------------+---------+-----------+----------+--------------+ PFV      Full                                                        +---------+---------------+---------+-----------+----------+--------------+ POP      Full           Yes      Yes                                 +---------+---------------+---------+-----------+----------+--------------+ PTV      Full                                                        +---------+---------------+---------+-----------+----------+--------------+ PERO     Full                                                        +---------+---------------+---------+-----------+----------+--------------+   +---------+---------------+---------+-----------+----------+--------------+  LEFT     CompressibilityPhasicitySpontaneityPropertiesThrombus Aging  +---------+---------------+---------+-----------+----------+--------------+ CFV      Full           Yes      Yes                                 +---------+---------------+---------+-----------+----------+--------------+ SFJ      Full                                                        +---------+---------------+---------+-----------+----------+--------------+ FV Prox  Full                                                        +---------+---------------+---------+-----------+----------+--------------+ FV Mid   Full                                                        +---------+---------------+---------+-----------+----------+--------------+ FV DistalFull                                                        +---------+---------------+---------+-----------+----------+--------------+ PFV      Full                                                        +---------+---------------+---------+-----------+----------+--------------+ POP      Full           Yes      Yes                                 +---------+---------------+---------+-----------+----------+--------------+ PTV      Full                                                        +---------+---------------+---------+-----------+----------+--------------+ PERO     Full                                                        +---------+---------------+---------+-----------+----------+--------------+     Summary: BILATERAL: - No evidence of deep vein thrombosis seen in the lower extremities, bilaterally. -No evidence of popliteal cyst, bilaterally. RIGHT: - Ultrasound characteristics of a ruptured Baker's Cyst are noted.   *See table(s) above for measurements and observations. Electronically signed  by Jamelle Haring on 07/01/2020 at 5:16:40 PM.    Final     PHYSICAL EXAM  GENERAL: She is doing okay at this time.  She seems more alert today and actually follows midline commands.  HEENT:  Unremarkable  EXTREMITIES: No edema   BACK: Normal  SKIN: Normal by inspection.    MENTAL STATUS: She is awake and alert.  However, she has clear severe global aphasia.  There is no verbal output.  She does focuses and tracks however.  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  MOTOR: Right upper extremity 2/5, right lower extremity 1/5.  Left side 4/5.  COORDINATION: No dysmetria or tremors noted.  SENSATION: Reduced sensation to pain on the right side.     MRI is reviewed in person and shows reduced evidence of acute ischemic stroke with increased signal on DWI involving the left temporal frontal area.  There are also similar findings over the right cerebellum.  There is evidence of small to moderate microhemorrhage involving the left temporal region.  Repeat head CT scan is reviewed and shows a small hemorrhage left temporal region unchanged from the MRI.  There is hypodensity involving the left temporal and frontal areas also unchanged.  Temp:  [98 F (36.7 C)-100.4 F (38 C)] 98 F (36.7 C) (01/30 0800) Pulse Rate:  [74-96] 87 (01/30 0800) Resp:  [16-32] 21 (01/30 0800) BP: (114-167)/(57-77) 167/77 (01/30 0800) SpO2:  [98 %-100 %] 100 % (01/30 0800) Weight:  [93.3 kg] 93.3 kg (01/30 0500)  General - Well nourished, well developed, in no apparent distress.  Ophthalmologic - fundi not visualized due to noncooperation.  Cardiovascular - Regular rhythm and rate.  Neuro - awake, eyes open, however, global aphasia, no speech output and not following commands, not able to name and repeat. Left gaze preference but able to gaze to right,  tracking bilaterally, however, blinking to visual threat on the left but not on the right. Mild right facial droop. Tongue protrusion not cooperative. LUE no drift but RUE drift but not hit bed within 10 sec. However, seems equal strength BLEs, 3/5 proximal and distally. Sensation, coordination not cooperative and gait not tested.   ASSESSMENT/PLAN [name] is a 65 y.o. female with history of hypertension, diabetes, CKD, and obesity status post gastric bypass in 05/2020 admitted for confusion, seizure-like activity, drooling, bowel incontinence, and left gaze and right-sided weakness. No tPA given due to outside window.  Status post IR for left MCA occlusion.  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  Aspects 6  CT head and neck left MCA origin occlusion with some left MCA branch reconstitution.  Severe stenosis right ICA origin, moderate stenosis right M2 origin, mild stenosis right M1 and VB junction  MRI / MRA - Acute infarct left MCA territory involving the basal ganglia as well as the left temporal, frontal, and parietal lobes. Small area of hemorrhage in the left posterior temporal lobe. Additional small areas of acute infarct in the occipital white matter bilaterally and right  cerebellum. Left MCA stent appears patent. Moderate intracranial atherosclerotic disease.  CT Abdomen and Pelvis - small retroperitoneal hematoma.  2D Echo EF 60 to 65%  LE venous Doppler Negative for DVT  Recommend loop recorder to rule out A. fib on discharge  LDL 68  HgbA1c 5.4  Lovenox for VTE prophylaxis  aspirin 81 mg daily prior to admission, now on aspirin 81 mg daily and Brilinta (ticagrelor) 90 mg bid.   Ongoing aggressive stroke risk factor management  Therapy recommendations: CIR - Rehab MD consult  pending for possible inpt rehab admission.  Disposition: Pending  Seizure like activity  At home with eyes rolling back, unresponsiveness, bowel incontinence, moaning and drooling  EEG Intermittent rhythmic delta activity in left frontal region is on the ictal-interictal continuum with low to intermediate potential for seizures.  We will put on Keppra 500 twice daily  Diabetes  HgbA1c 5.4 goal < 7.0  Controlled  CBG monitoring  SSI  DM education and close PCP follow up  Hypertension . Stable (no scheduled BP meds at this time) . BP 120-140 within 24 h of IR  Long term BP goal normotensive  Hyperlipidemia  Home meds: Lipitor 40  LDL 68, goal < 70  Now on Lipitor 80  Continue statin at discharge  Dysphagia  Did not pass a swallow  Keep n.p.o.  Status post core track  IV fluid and tube feeding  Other Stroke Risk Factors  Advanced age  Obesity, Body mass index is 37.62 kg/m.  Status post gastric bypass in 05/2020  Other Active Problems  CKD creatinine 1.3-> 1.44 - IV fluid and tube feeding - 1.47->1.26  NPO - tube feeding - free water 200 ml Q 4 hours  Hypernatremia - Na - 146 - free water 200 ml Q 4 hours added 07/03/20 per CCM.  Intubated -> extubated 07/01/20  Hypokalemia - potassium - 3.6->3.4->3.2 supplemented -> 3.7->3.8  Anemia - Hgb - 11.2->9.9->9.1->7.9->8.2->8.5 - Check stool for Hemoccult; consider transfusion if drops below 7. Occult blood card - negative x 1 ; CT Abdomen and Pelvis - small retroperitoneal hematoma.  Small area of hemorrhage in the left posterior temporal lobe by MRI 07/01/20. (consider CT scan if neuro changes).  Right now the benefit of dual antiplatelet agents outweigh the risk. Close follow-up is warranted however.  CCM goals of care discussion with family today -> Obtain PEG tube, transfer out of ICU and talk with CM about SNF placement. (Rehab MD consult pending for possible inpt rehab admission) (CCM plans to  contact IR for PEG placement)  Appreciate CCM assistance.  Hospital day # 3  Small bleed over the left temporal region.  Dual antiplatelet agents are continued at this time given that she has had a stent.  The benefits seem to outweigh the risk.  Repeat imaging has shows stable imaging finding of the head.  There is also a small retroperitoneal hematoma but the hemoglobin has improved. This patient is critically ill due to stroke post procedure and at significant risk of neurological worsening, death form severe anemia, bleeding, recurrent stroke, intracranial stenosis. This patient's care requires constant monitoring of vital signs, hemodynamics, respiratory and cardiac monitoring, review of multiple databases, neurological assessment, other specialists and medical decision making of high complexity. I spent 35 minutes of neurocritical care time in the care of this patient            To contact Stroke Continuity provider, please refer to [email or web address]. After hours, contact General Neurology

## 2020-07-04 ENCOUNTER — Inpatient Hospital Stay (HOSPITAL_COMMUNITY): Payer: Medicare Other

## 2020-07-04 ENCOUNTER — Encounter (HOSPITAL_COMMUNITY): Payer: Self-pay | Admitting: Neurology

## 2020-07-04 DIAGNOSIS — I10 Essential (primary) hypertension: Secondary | ICD-10-CM | POA: Diagnosis not present

## 2020-07-04 DIAGNOSIS — I63512 Cerebral infarction due to unspecified occlusion or stenosis of left middle cerebral artery: Secondary | ICD-10-CM | POA: Diagnosis not present

## 2020-07-04 LAB — CBC
HCT: 25 % — ABNORMAL LOW (ref 36.0–46.0)
Hemoglobin: 8.2 g/dL — ABNORMAL LOW (ref 12.0–15.0)
MCH: 28.3 pg (ref 26.0–34.0)
MCHC: 32.8 g/dL (ref 30.0–36.0)
MCV: 86.2 fL (ref 80.0–100.0)
Platelets: 402 10*3/uL — ABNORMAL HIGH (ref 150–400)
RBC: 2.9 MIL/uL — ABNORMAL LOW (ref 3.87–5.11)
RDW: 13.6 % (ref 11.5–15.5)
WBC: 7 10*3/uL (ref 4.0–10.5)
nRBC: 0 % (ref 0.0–0.2)

## 2020-07-04 LAB — BASIC METABOLIC PANEL
Anion gap: 9 (ref 5–15)
BUN: 11 mg/dL (ref 8–23)
CO2: 22 mmol/L (ref 22–32)
Calcium: 8.8 mg/dL — ABNORMAL LOW (ref 8.9–10.3)
Chloride: 114 mmol/L — ABNORMAL HIGH (ref 98–111)
Creatinine, Ser: 1.16 mg/dL — ABNORMAL HIGH (ref 0.44–1.00)
GFR, Estimated: 52 mL/min — ABNORMAL LOW (ref >60)
Glucose, Bld: 92 mg/dL (ref 70–99)
Potassium: 3.8 mmol/L (ref 3.5–5.1)
Sodium: 145 mmol/L (ref 135–145)

## 2020-07-04 LAB — GLUCOSE, CAPILLARY
Glucose-Capillary: 95 mg/dL (ref 70–99)
Glucose-Capillary: 86 mg/dL (ref 70–99)
Glucose-Capillary: 96 mg/dL (ref 70–99)
Glucose-Capillary: 117 mg/dL — ABNORMAL HIGH (ref 70–99)
Glucose-Capillary: 85 mg/dL (ref 70–99)

## 2020-07-04 IMAGING — DX DG ABD PORTABLE 1V
1 series · 1 of 1 positions shown · non-contrast
Comparison: [DATE] abdominal radiograph

CLINICAL DATA: Enteric tube placement, history of gastric bypass

EXAM:
PORTABLE ABDOMEN - 1 VIEW

[abdomen kub]
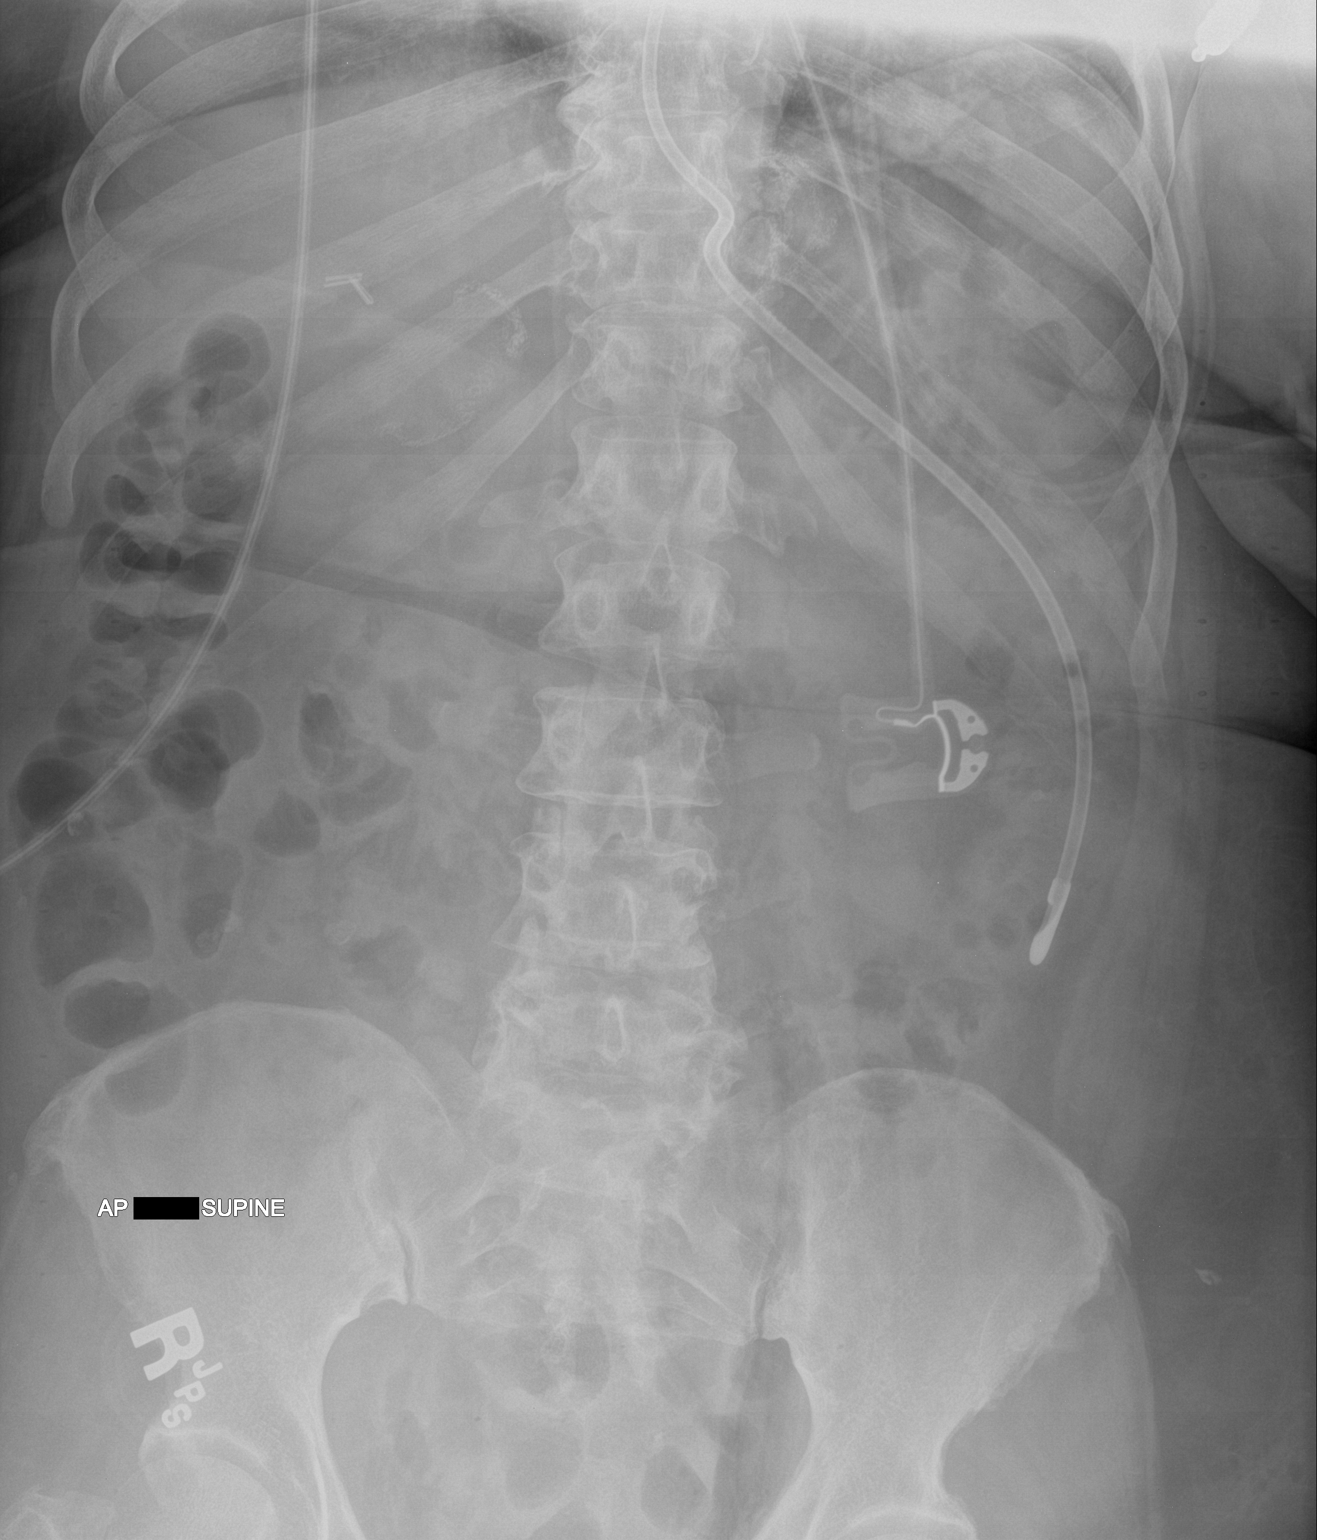

[1 of 1 positions shown; findings below may reference images not displayed]

FINDINGS: Weighted enteric tube terminates in mid to lower left abdomen within
a proximal small bowel loop. Cholecystectomy clips are seen in the
right upper quadrant of the abdomen. Surgical sutures are noted in
the medial upper abdomen bilaterally. No dilated small bowel loops.
No evidence of pneumatosis or pneumoperitoneum. No radiopaque
nephrolithiasis.
IMPRESSION: Weighted enteric tube terminates in the mid to lower left abdomen
within a proximal small bowel loop in this patient with a history of
gastric bypass surgery.

## 2020-07-04 MED ORDER — PANTOPRAZOLE SODIUM 40 MG PO PACK
40.0000 mg | PACK | Freq: Every day | ORAL | Status: DC
  Administered 2020-07-05 – 2020-07-09 (×5): 40 mg
  Filled 2020-07-04 (×6): qty 20

## 2020-07-04 MED ORDER — CALCIUM CARBONATE-VITAMIN D 500-200 MG-UNIT PO TABS
1.0000 | ORAL_TABLET | Freq: Three times a day (TID) | ORAL | Status: DC
  Administered 2020-07-04 – 2020-07-05 (×3): 1 via ORAL
  Filled 2020-07-04 (×3): qty 1

## 2020-07-04 MED ORDER — CALCIUM CITRATE-VITAMIN D 500-500 MG-UNIT PO CHEW
1.0000 | CHEWABLE_TABLET | Freq: Three times a day (TID) | ORAL | Status: DC
  Filled 2020-07-04 (×2): qty 1

## 2020-07-04 NOTE — Progress Notes (Signed)
Modified Barium Swallow Progress Note  Patient Details  Name: Courtney Burke MRN: [id number] Date of Birth: [birth year]  Today's Date: 07/04/2020  Modified Barium Swallow completed.  Full report located under Chart Review in the Imaging Section.  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 She cleared oral cavity with mechanical soft textures. Consistently lingual residue fell to pyriform sinuses after swallow with spontaneous subswallow to clear. Her intact strength and ROM allowed her to achieve complete laryngeal protection. However, mistimed initiation of protection in addition to larger sip with straw, led to trace aspiration stopping just below her vocal cords. A weak reflexive throat clear audible after 10 second delay. Pt's comprehension and execution of postures may not be consistent and able to rely on for safe consumption. Recommend initiate Dys 2 (fine chopped), thin liquids, NO straws, full supervision/assist, check for oral pocketing and pills whole in puree. Esophageal scan was unremarkable.   Swallow Evaluation Recommendations       SLP Diet Recommendations: Dysphagia 2 (Fine chop) solids;Thin liquid   Liquid Administration via: No straw;Cup   Medication Administration: Whole meds with puree   Supervision: Full assist for feeding;Staff to assist with self feeding;Patient able to self feed   Compensations: Slow rate;Small sips/bites;Lingual sweep for clearance of pocketing;Other (Comment) (slow pace)   Postural Changes: Seated upright at 90 degrees   Oral Care Recommendations: Oral care BID        Houston Siren 07/04/2020,3:25 PM  Orbie Pyo Colvin Caroli.Ed Risk analyst [phone] Office [phone]

## 2020-07-04 NOTE — PMR Pre-admission (Addendum)
PMR Admission Coordinator Pre-Admission Assessment  Patient: Courtney Burke is an 65 y.o., female MRN: [id number] DOB: [birth year] Height: 5' 2"  (157.5 cm) Weight: 91.3 kg              Insurance Information HMO:  PPO:      PCP:      IPA:      80/20:      OTHER:  PRIMARY: [employer] Medicare       Policy#: [id number]      Subscriber: Pt.  CM Name:  Montey Hora     Phone#: #: [phone] option #8Fax#: [phone]  I received approval from Levada Dy at [place] on 2/1 with approval for admission 2/2, with clinical updates due on 2/8 Pre-Cert#: [id number]     Employer:  Benefits:  Phone #: opened on online portal    Eff Date: 06/04/2020- still active Deductible: $200 ($0 met) OOP Max: $2,200 ($0 met) CIR: After the $200.00 deductible has been met then $0.00 Copayment per admit for Medicare-covered hospital care. SNF: 100% coverage Outpatient:  $0/visit, does not have a limitation on the number of visits allowed for therapy coverage Home Health: 100% coverage; DME: 100% coverage   SECONDARY:       Policy#:       Phone#:   Financial Counselor:      Phone#:   The "Data Collection Information Summary" for patients in Inpatient Rehabilitation Facilities with attached "Privacy Act [place] Records" was provided and verbally reviewed with: Patient  Emergency Contact Information Contact Information    Name Relation Home Work [place], [place] Daughter   [phone]   Grant Ruts [phone]     Short,Chris Significant other [phone]       Current Medical History  Patient Admitting Diagnosis: CVA History of Present Illness: Courtney Burke is a 65 y.o. 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  Presented 06/30/2020 with altered mental status and aphasia.  Cranial CT scan showed acute left MCA territory infarction no hemorrhage or mass-effect.  Patient  did not receive TPA.  EEG negative for seizure.  CT angiogram head and neck positive for emergent large vessel occlusion.  Left MCA origin.  Some left MCA branch reconstitution  and underwent MCA stenting per interventional radiology.  MRI follow-up showed acute infarct left MCA territory involving the basal ganglia as well as left temporal frontal and parietal lobes.  Small amount of hemorrhage in the left posterior temporal lobe.  Additional small areas of acute infarct in the occipital white matter bilaterally and right cerebellum.  Echocardiogram with ejection fraction of 60 to 65% no wall motion abnormalities grade 1 diastolic dysfunction.  Currently maintained on aspirin 81 mg daily as well as Brilinta for CVA prophylaxis.  Subcutaneous Lovenox for DVT prophylaxis.  Patient is maintained on Keppra for seizure prophylaxis.  Patient is currently n.p.o. with alternative means of nutritional support as patient did pull out her NG tube there was consideration of possible need for PEG tube.  Bouts of urinary retention placed on Urecholine. Physical Medicine & Rehabilitation was consulted to assess candidacy for CIR given global aphasia and right sided weakness.   Complete NIHSS TOTAL: 10 Glasgow Coma Scale Score: 12  Past Medical History  Past Medical History:  Diagnosis Date  . Asthma   . Borderline diabetes   . Hypertension     Family History  family history is not on file.  Prior Rehab/Hospitalizations:  Has the patient had prior rehab or hospitalizations prior to admission? No  Has the patient had major surgery during 100 days prior to admission? Yes  Current Medications   Current Facility-Administered Medications:  .  acetaminophen (TYLENOL) tablet 650 mg, 650 mg, Oral, Q4H PRN **OR** acetaminophen (TYLENOL) 160 MG/5ML solution 650 mg, 650 mg, Per Tube, Q4H PRN, 650 mg at 07/08/20 0950 **OR** acetaminophen (TYLENOL) suppository 650 mg, 650 mg, Rectal, Q4H PRN, Kirby-Graham, Karsten Fells,  NP .  albuterol (PROVENTIL) (2.5 MG/3ML) 0.083% nebulizer solution 2.5 mg, 2.5 mg, Nebulization, Q6H PRN, Sherry Ruffing, Marissa M, MD .  aspirin chewable tablet 81 mg, 81 mg, Oral, Daily **OR** aspirin chewable tablet 81 mg, 81 mg, Per Tube, Daily, Deveshwar, Sanjeev, MD, 81 mg at 07/08/20 0926 .  atorvastatin (LIPITOR) tablet 80 mg, 80 mg, Per Tube, QHS, Bhagat, Srishti L, MD, 80 mg at 07/07/20 2245 .  bethanechol (URECHOLINE) tablet 10 mg, 10 mg, Per Tube, TID, Wendee Beavers T, MD, 10 mg at 07/08/20 0927 .  calcium citrate (CALCITRATE - dosed in mg elemental calcium) tablet 200 mg of elemental calcium, 200 mg of elemental calcium, Per Tube, TID, Skeet Simmer, RPH, 200 mg of elemental calcium at 07/08/20 0926 .  chlorhexidine gluconate (MEDLINE KIT) (PERIDEX) 0.12 % solution 15 mL, 15 mL, Mouth Rinse, BID, Bowser, Grace E, NP, 15 mL at 07/08/20 0954 .  Chlorhexidine Gluconate Cloth 2 % PADS 6 each, 6 each, Topical, Daily, Bhagat, Srishti L, MD, 6 each at 07/08/20 0929 .  cholecalciferol (VITAMIN D3) tablet 1,000 Units, 1,000 Units, Per Tube, QAC breakfast, Skeet Simmer, [employer], 1,000 Units at 07/08/20 3785 .  docusate (COLACE) 50 MG/5ML liquid 100 mg, 100 mg, Per Tube, BID, Bowser, Laurel Dimmer, NP, 100 mg at 07/08/20 0924 .  enoxaparin (LOVENOX) injection 40 mg, 40 mg, Subcutaneous, Q24H, Rosalin Hawking, MD, 40 mg at 07/07/20 2242 .  feeding supplement (ENSURE ENLIVE / ENSURE PLUS) liquid 237 mL, 237 mL, Oral, BID BM, Gonfa, Taye T, MD, 237 mL at 07/07/20 1035 .  feeding supplement (OSMOLITE 1.2 CAL) liquid 1,000 mL, 1,000 mL, Per Tube, Continuous, Rosalin Hawking, MD, Stopped at 07/03/20 0130 .  feeding supplement (PROSource TF) liquid 45 mL, 45 mL, Per Tube, BID, Rosalin Hawking, MD, 45 mL at 07/08/20 0925 .  free water 200 mL, 200 mL, Per Tube, Q4H, Freddi Starr, MD, 200 mL at 07/08/20 0929 .  iohexol (OMNIPAQUE) 300 MG/ML solution 50 mL, 50 mL, Intra-arterial, Once PRN, Deveshwar, Sanjeev, MD .  labetalol  (NORMODYNE) injection 10 mg, 10 mg, Intravenous, Q2H PRN, Asencion Noble, MD, 10 mg at 07/05/20 0540 .  levETIRAcetam (KEPPRA) 100 MG/ML solution 500 mg, 500 mg, Per Tube, BID, Rosalin Hawking, MD, 500 mg at 07/08/20 8850 .  multivitamin with minerals tablet 1 tablet, 1 tablet, Per Tube, BID, Loyce Dys D, RD, 1 tablet at 07/08/20 2774 .  ondansetron (ZOFRAN) injection 4 mg, 4 mg, Intravenous, Q6H PRN, Frederik Pear, MD, 4 mg at 07/01/20 0854 .  pantoprazole sodium (PROTONIX) 40 mg/20 mL oral suspension 40 mg, 40 mg, Per Tube, Daily, Jennye Boroughs, MD, 40 mg at 07/08/20 0925 .  polyethylene glycol (MIRALAX / GLYCOLAX) packet 17 g, 17 g, Per Tube, Daily, Bowser, Grace E, NP, 17 g at 07/08/20 0926 .  Resource ThickenUp Clear, , Oral, PRN, Cyndia Skeeters, Taye T, MD .  senna-docusate (Senokot-S) tablet 1 tablet, 1 tablet, Per Tube, QHS, Bhagat, Srishti L, MD, 1 tablet at 07/07/20  2245 .  sodium chloride flush (NS) 0.9 % injection 3 mL, 3 mL, Intravenous, Once, Truddie Hidden, MD .  ticagrelor [facility]) tablet 90 mg, 90 mg, Oral, BID **OR** ticagrelor (BRILINTA) tablet 90 mg, 90 mg, Per Tube, BID, Deveshwar, Sanjeev, MD, 90 mg at 07/08/20 5427  Patients Current Diet:  Diet Order            Diet - low sodium heart healthy           DIET DYS 2 Room service appropriate? No; Fluid consistency: Nectar Thick  Diet effective now                 Precautions / Restrictions Precautions Precautions: Fall Restrictions Weight Bearing Restrictions: No   Has the patient had 2 or more falls or a fall with injury in the past year?No  Prior Activity Level Community (5-7x/wk): Pt. was active in the community PTA  Prior Functional Level Prior Function Comments: unable to determine as no family present and pt is aphasic. Based on documentation it seems the pt was able to drive her brothers car so PT assumes the pt is fairly independent.  Self Care: Did the patient need help bathing, dressing,  using the toilet or eating?  Independent  Indoor Mobility: Did the patient need assistance with walking from room to room (with or without device)? Independent  Stairs: Did the patient need assistance with internal or external stairs (with or without device)? Independent  Functional Cognition: Did the patient need help planning regular tasks such as shopping or remembering to take medications? Independent  Home Assistive Devices / Equipment    Prior Device Use: Indicate devices/aids used by the patient prior to current illness, exacerbation or injury? None of the above  Current Functional Level Cognition  Arousal/Alertness: Awake/alert Overall Cognitive Status: Difficult to assess Difficult to assess due to: Impaired communication Current Attention Level: Sustained Orientation Level: Other (comment) (UTA) Following Commands: Follows one step commands consistently,Follows multi-step commands inconsistently Safety/Judgement: Decreased awareness of safety General Comments: pt needing extra time for processing. Difficult to assess due to aphasia and pt very flat today Attention: Sustained Sustained Attention: Impaired Sustained Attention Impairment: Functional basic Memory:  (TBA) Awareness: Impaired Awareness Impairment: Emergent impairment Problem Solving:  (will assess further) Safety/Judgment: Impaired    Extremity Assessment (includes Sensation/Coordination)  Upper Extremity Assessment: RUE deficits/detail RUE Deficits / Details: Pt with decreased AROM. Initating functional reach, unsure how purposeful. Shoulder hiking in attempted reach. Very weak. poor grasp. RUE Coordination: decreased fine motor,decreased gross motor  Lower Extremity Assessment: Defer to PT evaluation RLE Deficits / Details: pt demonstrates at least 3/5 strength, difficult to formally assess 2/2 communication deficits LLE Deficits / Details: at least 4-/5 based on observed mobility, difficult to formally  assess    ADLs  Overall ADL's : Needs assistance/impaired Eating/Feeding: NPO Eating/Feeding Details (indicate cue type and reason): pt noted to be choking on secretions sitting in bed. pt with tearful eyes nose slightly wet and drainage from mouth Grooming: Wash/dry face,Moderate assistance,Sitting Grooming Details (indicate cue type and reason): Pt particiapting in washing her face while seated at sink. Requiring Max simple cues for initating each step to wash face. Processing slowly. Upper Body Bathing: Cueing for sequencing Lower Body Bathing: Cueing for safety Toilet Transfer: Minimal assistance,+2 for physical assistance,Ambulation (simulated to chair) Toilet Transfer Details (indicate cue type and reason): Min A to power up into standing. Supporting RUE for weight bearing Functional mobility during ADLs: Minimal assistance,Moderate assistance,+2 for  safety/equipment,Rolling walker General ADL Comments: Focused on mobility to/from sink and then sequencing ADL at sink. Pt continues to present with decreased balance, strength, inattention, and cognition    Mobility  Overal bed mobility: Needs Assistance Bed Mobility: Supine to Sit Rolling: Mod assist Sidelying to sit: Min guard,HOB elevated General bed mobility comments: increased time and use of bed rails    Transfers  Overall transfer level: Needs assistance Equipment used: Rolling walker (2 wheeled) Transfers: Sit to/from Stand Sit to Stand: Min assist Stand pivot transfers: Min assist General transfer comment: minA with HHA for initial transfers. Pt with modA later in session possibly due to fatigue    Ambulation / Gait / Stairs / Wheelchair Mobility  Ambulation/Gait Ambulation/Gait assistance: Herbalist (Feet): 35 Feet Assistive device: Rolling walker (2 wheeled) Gait Pattern/deviations: Step-to pattern General Gait Details: pt with shortened step-to gait, able to maintain RUE on RW but having difficulty  utilizing it to steer, often drifting toward R side Gait velocity: reduced Gait velocity interpretation: <1.31 ft/sec, indicative of household ambulator    Posture / Balance Dynamic Sitting Balance Sitting balance - Comments: supervsion for safety EOB Balance Overall balance assessment: Needs assistance Sitting-balance support: No upper extremity supported,Feet supported Sitting balance-Leahy Scale: Fair Sitting balance - Comments: supervsion for safety EOB Standing balance support: Bilateral upper extremity supported Standing balance-Leahy Scale: Poor Standing balance comment: reliant on UE support of RW    Special needs/care consideration Bowel and Bladder incontinence, [place] and Designated visitor Rachelann Enloe, daughter     Previous Home Environment (from acute therapy documentation) Living Arrangements: Children,Other relatives  Lives With: Daughter,Other (Comment) Available Help at Discharge: Family,Friend(s) Type of Home: [place]: Two level,Able to live on main level with bedroom/bathroom Home Access: Ramped entrance Bathroom Shower/Tub: Chiropodist: Standard Bathroom Accessibility: Yes How Accessible: Accessible via wheelchair,Accessible via walker Sandy: No Additional Comments: pt with expressive and receptive aphasia, unable to report history, no family present at the time of evaluation  Discharge Living Setting Plans for Discharge Living Setting: Patient's home Type of Home at Discharge: House Discharge Home Layout: Two level,Able to live on main level with bedroom/bathroom Discharge Home Access: Ramped entrance Discharge Bathroom Shower/Tub: [place] unit Discharge Bathroom Toilet: Standard Discharge Bathroom Accessibility: Yes How Accessible: Accessible via walker,Accessible via wheelchair  [place] Patient Roles: Other (Comment) Contact Information: [phone] Anticipated Caregiver:  Alvy Beal Anticipated Caregiver's Contact Information: [phone] Ability/Limitations of Caregiver: Daughter works during the day, Pt.'s friend plans to stay with her during days (a retired Therapist, sports) Careers adviser: 24/7 Discharge Plan Discussed with Primary Caregiver: Yes Is Caregiver In Agreement with Plan?: Yes Does Caregiver/Family have Issues with Lodging/Transportation while Pt is in Rehab?: Yes   Goals Patient/Family Goal for Rehab: PT/OT/SLP supervision Expected length of stay: 10-14 days Pt/Family Agrees to Admission and willing to participate: Yes Program Orientation Provided & Reviewed with Pt/Caregiver Including Roles  & Responsibilities: Yes   Decrease burden of Care through IP rehab admission: Specialzed equipment needs, Diet advancement, Decrease number of caregivers, Bowel and bladder program and Patient/family education   Possible need for SNF placement upon discharge:not anticipated   Patient Condition: This patient's medical and functional status has changed since the consult dated: 07/04/20 in which the Rehabilitation Physician determined and documented that the patient's condition is appropriate for intensive rehabilitative care in an inpatient rehabilitation facility. See "History of Present Illness" (above) for medical update. Functional changes are: Pt now Min G with bed  mobility, Min A with transfers, and Min A 35' with gait. Patient's medical and functional status update has been discussed with the Rehabilitation physician and patient remains appropriate for inpatient rehabilitation. Will admit to inpatient rehab today.  Preadmission Screen Completed By:  Farley Ly. Staley, CCC-SLP, with day of admission updates by  Bethel Born, CCC-SLP, 07/08/2020 11:03 AM.  Admission held due to lack of bed availability, admitted on 07/09/2020 ______________________________________________________________________   Discussed status with Dr. Posey Pronto on 07/08/20  at  11:03 AM and received approval for admission today.  Admission Coordinator: Farley Ly. Staley, with day of admission updates by  Bethel Born, time 11:03 AM Sudie Grumbling 07/08/20

## 2020-07-04 NOTE — Consult Note (Incomplete)
Consult Note  Courtney Burke [birth year]  [id number].    Requesting MD: Dr. Jennye Boroughs Chief Complaint/Reason for Consult: PEG placement   HPI:  Hx of gastric bypass  ROS: ROS  History reviewed. No pertinent family history.  Past Medical History:  Diagnosis Date  . Asthma   . Borderline diabetes   . Hypertension     Past Surgical History:  Procedure Laterality Date  . ANKLE SURGERY    . CARPAL TUNNEL RELEASE    . carpel tunnel    . IR CT HEAD LTD  06/30/2020  . IR INTRA CRAN STENT  06/30/2020  . IR PERCUTANEOUS ART THROMBECTOMY/INFUSION INTRACRANIAL INC DIAG ANGIO  06/30/2020  . RADIOLOGY WITH ANESTHESIA N/A 06/30/2020   Procedure: RADIOLOGY WITH ANESTHESIA;  Surgeon: Radiologist, Medication, MD;  Location: [facility];  Service: Radiology;  Laterality: N/A;    Social History:  reports that she has never smoked. She has never used smokeless tobacco. She reports that she does not drink alcohol and does not use drugs.  Allergies:  Allergies  Allergen Reactions  . Iodinated Diagnostic Agents Itching  . Doxycycline Nausea And Vomiting  . Sulfa Antibiotics Rash and Hives  . Codeine Hives and Swelling    Swollen tongue  . Hydrocodone-Homatropine Itching  . Ace Inhibitors Cough, Itching and Rash    Medications Prior to Admission  Medication Sig Dispense Refill  . acetaminophen (TYLENOL) 500 MG tablet Take 500-1,000 mg by mouth every 6 (six) hours as needed for headache (pain).    . naproxen sodium (ALEVE) 220 MG tablet Take 660-880 mg by mouth 2 (two) times daily as needed (pain/headaches).      Blood pressure (!) 162/96, pulse 67, temperature 98.6 F (37 C), temperature source Oral, resp. rate 18, height 5\' 2"  (1.575 m), weight 93 kg, last menstrual period 09/28/2012, SpO2 99 %. Physical Exam: *** General: pleasant, WD, WN white female who is laying in bed in NAD HEENT: head is normocephalic, atraumatic.  Sclera are noninjected.  PERRL.  Ears and nose without any  masses or lesions.  Mouth is pink and moist Heart: regular, rate, and rhythm.  Normal s1,s2. No obvious murmurs, gallops, or rubs noted.  Palpable radial and pedal pulses bilaterally Lungs: CTAB, no wheezes, rhonchi, or rales noted.  Respiratory effort nonlabored Abd: soft, NT, ND, +BS, no masses, hernias, or organomegaly MS: all 4 extremities are symmetrical with no cyanosis, clubbing, or edema. Skin: warm and dry with no masses, lesions, or rashes Neuro: Cranial nerves 2-12 grossly intact, speech is normal Psych: A&Ox3 with an appropriate affect.   Results for orders placed or performed during the hospital encounter of 06/30/20 (from the past 48 hour(s))  CBC     Status: Abnormal   Collection Time: 07/02/20  1:41 PM  Result Value Ref Range   WBC 10.3 4.0 - 10.5 K/uL   RBC 2.92 (L) 3.87 - 5.11 MIL/uL   Hemoglobin 8.2 (L) 12.0 - 15.0 g/dL   HCT 25.2 (L) 36.0 - 46.0 %   MCV 86.3 80.0 - 100.0 fL   MCH 28.1 26.0 - 34.0 pg   MCHC 32.5 30.0 - 36.0 g/dL   RDW 14.2 11.5 - 15.5 %   Platelets 369 150 - 400 K/uL   nRBC 0.0 0.0 - 0.2 %    Comment: Performed at [facility], [place] [address] [place], [place] [address]  Glucose, capillary     Status: Abnormal   Collection Time: 07/02/20  3:46 PM  Result Value Ref Range   Glucose-Capillary 133 (H) 70 - 99 mg/dL    Comment: Glucose reference range applies only to samples taken after fasting for at least 8 hours.  Glucose, capillary     Status: Abnormal   Collection Time: 07/02/20  7:20 PM  Result Value Ref Range   Glucose-Capillary 166 (H) 70 - 99 mg/dL    Comment: Glucose reference range applies only to samples taken after fasting for at least 8 hours.  Glucose, capillary     Status: Abnormal   Collection Time: 07/02/20 11:26 PM  Result Value Ref Range   Glucose-Capillary 168 (H) 70 - 99 mg/dL    Comment: Glucose reference range applies only to samples taken after fasting for at least 8 hours.  Glucose, capillary     Status: Abnormal    Collection Time: 07/03/20  3:18 AM  Result Value Ref Range   Glucose-Capillary 103 (H) 70 - 99 mg/dL    Comment: Glucose reference range applies only to samples taken after fasting for at least 8 hours.  CBC     Status: Abnormal   Collection Time: 07/03/20  5:34 AM  Result Value Ref Range   WBC 9.3 4.0 - 10.5 K/uL   RBC 2.90 (L) 3.87 - 5.11 MIL/uL   Hemoglobin 8.5 (L) 12.0 - 15.0 g/dL   HCT 25.2 (L) 36.0 - 46.0 %   MCV 86.9 80.0 - 100.0 fL   MCH 29.3 26.0 - 34.0 pg   MCHC 33.7 30.0 - 36.0 g/dL   RDW 14.1 11.5 - 15.5 %   Platelets 385 150 - 400 K/uL   nRBC 0.0 0.0 - 0.2 %    Comment: Performed at [facility], [place] [address], [place], [place] [id number]  Basic metabolic panel     Status: Abnormal   Collection Time: 07/03/20  5:34 AM  Result Value Ref Range   Sodium 146 (H) 135 - 145 mmol/L   Potassium 3.8 3.5 - 5.1 mmol/L   Chloride 115 (H) 98 - 111 mmol/L   CO2 20 (L) 22 - 32 mmol/L   Glucose, Bld 107 (H) 70 - 99 mg/dL    Comment: Glucose reference range applies only to samples taken after fasting for at least 8 hours.   BUN 12 8 - 23 mg/dL   Creatinine, Ser 1.26 (H) 0.44 - 1.00 mg/dL   Calcium 8.4 (L) 8.9 - 10.3 mg/dL   GFR, Estimated 47 (L) >60 mL/min    Comment: (NOTE) Calculated using the CKD-EPI Creatinine Equation (2021)    Anion gap 11 5 - 15    Comment: Performed at [facility] [address], [place], [place] [address]  Glucose, capillary     Status: Abnormal   Collection Time: 07/03/20  7:42 AM  Result Value Ref Range   Glucose-Capillary 100 (H) 70 - 99 mg/dL    Comment: Glucose reference range applies only to samples taken after fasting for at least 8 hours.  Glucose, capillary     Status: Abnormal   Collection Time: 07/03/20 11:33 AM  Result Value Ref Range   Glucose-Capillary 108 (H) 70 - 99 mg/dL    Comment: Glucose reference range applies only to samples taken after fasting for at least 8 hours.  Glucose, capillary     Status: None    Collection Time: 07/03/20  3:30 PM  Result Value Ref Range   Glucose-Capillary 94 70 - 99 mg/dL    Comment:  Glucose reference range applies only to samples taken after fasting for at least 8 hours.  Glucose, capillary     Status: None   Collection Time: 07/03/20  7:42 PM  Result Value Ref Range   Glucose-Capillary 97 70 - 99 mg/dL    Comment: Glucose reference range applies only to samples taken after fasting for at least 8 hours.  Glucose, capillary     Status: None   Collection Time: 07/03/20 11:12 PM  Result Value Ref Range   Glucose-Capillary 91 70 - 99 mg/dL    Comment: Glucose reference range applies only to samples taken after fasting for at least 8 hours.  Glucose, capillary     Status: None   Collection Time: 07/04/20  3:31 AM  Result Value Ref Range   Glucose-Capillary 95 70 - 99 mg/dL    Comment: Glucose reference range applies only to samples taken after fasting for at least 8 hours.  CBC     Status: Abnormal   Collection Time: 07/04/20  7:07 AM  Result Value Ref Range   WBC 7.0 4.0 - 10.5 K/uL   RBC 2.90 (L) 3.87 - 5.11 MIL/uL   Hemoglobin 8.2 (L) 12.0 - 15.0 g/dL   HCT 25.0 (L) 36.0 - 46.0 %   MCV 86.2 80.0 - 100.0 fL   MCH 28.3 26.0 - 34.0 pg   MCHC 32.8 30.0 - 36.0 g/dL   RDW 13.6 11.5 - 15.5 %   Platelets 402 (H) 150 - 400 K/uL   nRBC 0.0 0.0 - 0.2 %    Comment: Performed at [facility] [address] [place], [place] [address]  Basic metabolic panel     Status: Abnormal   Collection Time: 07/04/20  7:07 AM  Result Value Ref Range   Sodium 145 135 - 145 mmol/L   Potassium 3.8 3.5 - 5.1 mmol/L   Chloride 114 (H) 98 - 111 mmol/L   CO2 22 22 - 32 mmol/L   Glucose, Bld 92 70 - 99 mg/dL    Comment: Glucose reference range applies only to samples taken after fasting for at least 8 hours.   BUN 11 8 - 23 mg/dL   Creatinine, Ser 1.16 (H) 0.44 - 1.00 mg/dL   Calcium 8.8 (L) 8.9 - 10.3 mg/dL   GFR, Estimated 52 (L) >60 mL/min    Comment:  (NOTE) Calculated using the CKD-EPI Creatinine Equation (2021)    Anion gap 9 5 - 15    Comment: Performed at [facility] [address], [place], [place] [address]  Glucose, capillary     Status: None   Collection Time: 07/04/20  7:50 AM  Result Value Ref Range   Glucose-Capillary 86 70 - 99 mg/dL    Comment: Glucose reference range applies only to samples taken after fasting for at least 8 hours.  Glucose, capillary     Status: None   Collection Time: 07/04/20 11:16 AM  Result Value Ref Range   Glucose-Capillary 96 70 - 99 mg/dL    Comment: Glucose reference range applies only to samples taken after fasting for at least 8 hours.   CT ABDOMEN PELVIS WO CONTRAST  Result Date: 07/02/2020 CLINICAL DATA:  Retroperitoneal hematoma.  Decreased hemoglobin. EXAM: CT ABDOMEN AND PELVIS WITHOUT CONTRAST TECHNIQUE: Multidetector CT imaging of the abdomen and pelvis was performed following the standard protocol without IV contrast. COMPARISON:  CT dated 07/14/2019 FINDINGS: Lower chest: There is atelectasis versus aspiration at the lung bases.The heart  is enlarged. The intracardiac blood pool is hypodense relative to the adjacent myocardium consistent with anemia. Hepatobiliary: The liver is normal. Status post cholecystectomy.There is no biliary ductal dilation. Pancreas: Normal contours without ductal dilatation. No peripancreatic fluid collection. Spleen: Unremarkable. Adrenals/Urinary Tract: --Adrenal glands: Unremarkable. --Right kidney/ureter: No hydronephrosis or radiopaque kidney stones. --Left kidney/ureter: No hydronephrosis or radiopaque kidney stones. --Urinary bladder: There is a Foley catheter in place. Stomach/Bowel: --Stomach/Duodenum: Patient is status post prior gastric bypass. The enteric tube terminates in the proximal small bowel. --Small bowel: Unremarkable. --Colon: There is a large amount of stool at the level of the rectum. --Appendix: Not visualized. No right lower quadrant  inflammation or free fluid. Vascular/Lymphatic: There is fat stranding in the right inguinal region with a small retroperitoneal hematoma on the right. --No retroperitoneal lymphadenopathy. --No mesenteric lymphadenopathy. --No pelvic or inguinal lymphadenopathy. Reproductive: Unremarkable Other: No ascites or free air. There is a small fat containing umbilical hernia. Musculoskeletal. No acute displaced fractures. IMPRESSION: 1. Fat stranding in the right inguinal region with a small retroperitoneal hematoma on the right. 2. Cardiomegaly. 3. Anemia. 4. Bibasilar atelectasis versus aspiration. 5. Large amount of stool at the level of the rectum. Electronically Signed   By: Constance Holster M.D.   On: 07/02/2020 18:17   CT HEAD WO CONTRAST  Result Date: 07/02/2020 CLINICAL DATA:  Stroke follow-up.  Intracranial hemorrhage. EXAM: CT HEAD WITHOUT CONTRAST TECHNIQUE: Contiguous axial images were obtained from the base of the skull through the vertex without intravenous contrast. COMPARISON:  CT head 06/30/2020, MRI head 07/01/2020 FINDINGS: Brain: Left MCA infarct with hypodensity in the left temporal and parietal lobe as well as in the left basal ganglia involving the caudate and putamen. Small 1 cm hemorrhage left temporal lobe unchanged from prior CT and MRI. Small focus of subarachnoid hemorrhage in the left lateral temporal lobe which was present on the prior CT. Ventricle size normal. No midline shift. No new area of infarction. Vascular: Negative for hyperdense vessel Skull: Negative Sinuses/Orbits: Paranasal sinuses clear. NG tube in place. Negative orbit. Other: None IMPRESSION: Acute left MCA infarct unchanged from prior studies. Small amount of hemorrhage left temporal lobe also unchanged. No new area of hemorrhage or infarction. Electronically Signed   By: Franchot Gallo M.D.   On: 07/02/2020 17:49   DG Abd Portable 1V  Result Date: 07/04/2020 CLINICAL DATA:  Enteric tube placement, history of  gastric bypass EXAM: PORTABLE ABDOMEN - 1 VIEW COMPARISON:  07/01/2020 abdominal radiograph FINDINGS: Weighted enteric tube terminates in mid to lower left abdomen within a proximal small bowel loop. Cholecystectomy clips are seen in the right upper quadrant of the abdomen. Surgical sutures are noted in the medial upper abdomen bilaterally. No dilated small bowel loops. No evidence of pneumatosis or pneumoperitoneum. No radiopaque nephrolithiasis. IMPRESSION: Weighted enteric tube terminates in the mid to lower left abdomen within a proximal small bowel loop in this patient with a history of gastric bypass surgery. Electronically Signed   By: Ilona Sorrel M.D.   On: 07/04/2020 12:01      Assessment/Plan ***  Admit to ***  Norm Parcel, [facility] 07/04/2020, 1:21 PM Please see Amion for pager number during day hours 7:00am-4:30pm

## 2020-07-04 NOTE — Progress Notes (Signed)
Inpatient Rehab Admissions Coordinator:   I met with pt.'s daughter at bedside to discuss potential CIR admit. She stated that she would like to pursue potential admit once Pt. Is more medically stable. She states that she and other friends and family members plan to provide 24/7 support for Pt.   Burke Catholic, [place], [place] Admissions Coordinator  [phone] (office) '

## 2020-07-04 NOTE — Progress Notes (Signed)
Nutrition Follow-up  DOCUMENTATION CODES:   Not applicable  INTERVENTION:   Ttube feeding via Cortrak tube: Osmolite 1.2 at 55 ml/h (1320 ml per day) Prosource TF 45 ml BID  Provides 1664 kcal, 95 gm protein, 1070 ml free water daily  MVI with minerals BID Calcium Citrate 1 tablet TID   200 ml free water every 4 hours Total free water: 2270 ml   NUTRITION DIAGNOSIS:   Inadequate oral intake related to inability to eat as evidenced by NPO status. Ongoing.   GOAL:   Patient will meet greater than or equal to 90% of their needs Met with TF.   MONITOR:   TF tolerance  REASON FOR ASSESSMENT:   Consult Enteral/tube feeding initiation and management  ASSESSMENT:   Pt with PMH of HTN, DM, CKD III, vitamin D deficiency, obesity s/p gastric sleeve 12/21 admitted with L MCA s/p revascularization of occluded L MCA with stent.   Pt discussed during ICU rounds and with RN.  Pt with expressive aphasia. CIR consulted.   1/27 s/p thrombectomy and stent placement  1/28 extubated; failed swallow; cortrak placed tip gastric per radiologist but xray does not mention her recent vertical sleeve gastrectomy.  1/30 pt pulled out Cortrak tube 1/31 cortrak replaced; post pyloric   Medications reviewed and include: colace, SSI, miralax, senokot-s  Labs reviewed    Diet Order:   Diet Order            Diet NPO time specified  Diet effective now                 EDUCATION NEEDS:   Not appropriate for education at this time  Skin:  Skin Assessment: Reviewed RN Assessment  Last BM:  1/30 smear  Height:   Ht Readings from Last 1 Encounters:  06/30/20 _0  (1.575 m)    Weight:   Wt Readings from Last 1 Encounters:  07/04/20 93 kg    Ideal Body Weight:  50 kg  BMI:  Body mass index is 37.5 kg/m.  Estimated Nutritional Needs:   Kcal:  1600-1800  Protein:  90-110 grams  Fluid:  > 1.6 L/day  Lockie Pares., RD, LDN, CNSC See AMiON for contact information

## 2020-07-04 NOTE — Procedures (Signed)
Cortrak  Tube Location:  Left nare Initial Placement:  Stomach Secured by: Bridle Technique Used to Measure Tube Placement:  Documented cm marking at nare/ corner of mouth Cortrak Secured At:  70 cm    Cortrak Tube Team Note:  Consult received to place a Cortrak feeding tube.   X-ray is required, abdominal x-ray has been ordered by the Cortrak team. Please confirm tube placement before using the Cortrak tube.   If the tube becomes dislodged please keep the tube and contact the Cortrak team at [email or web address] (password [email or web address]) for replacement.  If after hours and replacement cannot be delayed, place a NG tube and confirm placement with an abdominal x-ray.    Koleen Distance MS, RD, LDN Please refer to [facility] for RD and/or RD on-call/weekend/after hours pager

## 2020-07-04 NOTE — Progress Notes (Signed)
Physical Therapy Treatment Patient Details Name: Courtney Burke MRN: [id number] DOB: 1955/12/18 Today's Date: 07/04/2020    History of Present Illness 65 y.o. female with a PMHx of HTN, DM, CKD III, asthma, Vit D deficiency, obesity s/p gastric sleeve 12/21 who presents to [facility] via [facility] as a CODE STROKE with AMS and aphasia. Pt's brother also reports multiple periods of LOC. In ED pt demonstrates L gaze and R hemiparesis. CTA demonstrates L M1 occlusion. Pt underwent L common carotid arteriogram with revascularization of L MCA on 06/30/2020.    PT Comments    The pt was able to demo good progress with mobility and gait training during today's session. She was able to improve gait distances to include short bouts within her room and complete multiple sit-stand transfers from the recliner with minA of 2 to maintain balance and power up. The pt continues to present with deficits in receptive and expressive aphasia, as well as strength, coordination, and mobility of her R-sided extremities and continues to be a good candidate for CIR level therapies at d/c.     Follow Up Recommendations  CIR     Equipment Recommendations   (defer to post acute)    Recommendations for Other Services       Precautions / Restrictions Precautions Precautions: Fall Restrictions Weight Bearing Restrictions: No    Mobility  Bed Mobility Overal bed mobility: Needs Assistance Bed Mobility: Rolling;Sidelying to Sit Rolling: Mod assist Sidelying to sit: Mod assist       General bed mobility comments: pt benefits from minA to initiate positioning or movement, then is able to assist with movement of BLE to EOB and trunk to complete transition.  Transfers Overall transfer level: Needs assistance Equipment used: 2 person hand held assist Transfers: Sit to/from [facility] Sit to Stand: Min assist;+2 physical assistance Stand pivot transfers: Min assist;+2 physical assistance       General  transfer comment: modA to power up and steady, pt attempting to stand prior to being asked by PT, benefits from slight assist to complete movement, then minA to maintain stability.  Ambulation/Gait Ambulation/Gait assistance: Min assist;+2 physical assistance Gait Distance (Feet): 12 Feet Assistive device: 2 person hand held assist Gait Pattern/deviations: Step-to pattern;Decreased step length - right;Decreased weight shift to right;Wide base of support Gait velocity: decreased   General Gait Details: poor clearance of RLE with shortened RLE stride resulting in RLE lag with gait despite cues    Modified Rankin (Stroke Patients Only) Modified Rankin (Stroke Patients Only) Pre-Morbid Rankin Score: No symptoms Modified Rankin: Moderately severe disability     Balance Overall balance assessment: Needs assistance Sitting-balance support: Single extremity supported;Bilateral upper extremity supported;Feet unsupported Sitting balance-Leahy Scale: Poor Sitting balance - Comments: minA   Standing balance support: Bilateral upper extremity supported Standing balance-Leahy Scale: Poor Standing balance comment: minA x2 hand hold                            Cognition Arousal/Alertness: Awake/alert Behavior During Therapy: Flat affect Overall Cognitive Status: Difficult to assess                                 General Comments: pt with expressive and receptive aphasia. Able to follow commands intermittently (<50%), with slight improvement when multimodal cues used. Pt able to mimic movements of PT with improved accuracy  Exercises      General Comments General comments (skin integrity, edema, etc.): VSS on RA      Pertinent Vitals/Pain Pain Assessment: Faces Faces Pain Scale: No hurt Pain Intervention(s): Monitored during session;Repositioned           PT Goals (current goals can now be found in the care plan section) Acute Rehab PT Goals Patient  Stated Goal: unable to state PT Goal Formulation: Patient unable to participate in goal setting Time For Goal Achievement: 07/15/20 Potential to Achieve Goals: Good Progress towards PT goals: Progressing toward goals    Frequency    Min 4X/week      PT Plan Current plan remains appropriate       AM-PAC PT "6 Clicks" Mobility   Outcome Measure  Help needed turning from your back to your side while in a flat bed without using bedrails?: A Lot Help needed moving from lying on your back to sitting on the side of a flat bed without using bedrails?: A Lot Help needed moving to and from a bed to a chair (including a wheelchair)?: A Lot Help needed standing up from a chair using your arms (e.g., wheelchair or bedside chair)?: A Lot Help needed to walk in hospital room?: A Lot Help needed climbing 3-5 steps with a railing? : Total 6 Click Score: 11    End of Session Equipment Utilized During Treatment: Gait belt Activity Tolerance: Patient tolerated treatment well Patient left: in chair;with call bell/phone within reach;with chair alarm set;with restraints reapplied Nurse Communication: Mobility status PT Visit Diagnosis: Other abnormalities of gait and mobility (R26.89);Muscle weakness (generalized) (M62.81);Other symptoms and signs involving the nervous system (I29.798)     Time: 9211-9417 PT Time Calculation (min) (ACUTE ONLY): 25 min  Charges:  $Gait Training: 8-22 mins $Therapeutic Activity: 8-22 mins                     Karma Ganja, PT, DPT   Acute Rehabilitation Department Pager #: [phone]   Otho Bellows 07/04/2020, 5:56 PM

## 2020-07-04 NOTE — Progress Notes (Signed)
  Speech Language Pathology Treatment: Dysphagia;Cognitive-Linquistic  Patient Details Name: Courtney Burke MRN: [id number] DOB: 1956-03-07 Today's Date: 07/04/2020 Time: 0950-1004 SLP Time Calculation (min) (ACUTE ONLY): 14 min  Assessment / Plan / Recommendation Clinical Impression  Daughter present for intervention focusing on dysphagia and apraxia and aphasia. Eye contact and focus improved. She could open mouth and protrude tongue to verbal command only- not close/rub lips together with verbal or visual cues. Delayed and discoordinated oral propulsion and suspected delayed swallow initiation. Question of laryngeal intrusion following immediate throat clear with thin and delayed with puree. Instrumental observation and intervention needed and scheduled today at 1400. She initiated "ah" with difficulty ceasing phonation on command and partially able to imitate "ah" with varying prosody.    HPI HPI: Courtney Burke is a 65 y.o. 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 CT probable few small areas of acute left MCA infarction, less apparent. Ill-defined patchy hyperdensity may reflect contrast. Found to have M1 occlusion and underwent thrombectomy and intubated 1/27-1/28 am. MRI pending.      SLP Plan  MBS       Recommendations  Diet recommendations: NPO                Oral Care Recommendations: Oral care QID Follow up Recommendations: Inpatient Rehab SLP Visit Diagnosis: Aphasia (R47.01);Apraxia (R48.2);Cognitive communication deficit (R41.841);Dysphagia, unspecified (R13.10) Plan: MBS                       Houston Siren 07/04/2020, 10:10 AM Orbie Pyo Colvin Caroli.Ed Risk analyst [phone] Office [phone]

## 2020-07-04 NOTE — Progress Notes (Signed)
STROKE TEAM PROGRESS NOTE   SUBJECTIVE (INTERVAL HISTORY) Patient remains aphasic with mild right hemiparesis.  She is due for speech therapy swallow eval today.  She pulled out her feeding tube.  She may need a core track tube placement she is unable to swallow.  Vital signs are stable.  Neuro exam is unchanged.  OBJECTIVE Temp:  [98.4 F (36.9 C)-98.9 F (37.2 C)] 98.5 F (36.9 C) (01/31 1543) Pulse Rate:  [28-94] 80 (01/31 1400) Cardiac Rhythm: Normal sinus rhythm (01/31 0800) Resp:  [12-27] 20 (01/31 1400) BP: (136-175)/(64-96) 156/87 (01/31 1400) SpO2:  [89 %-100 %] 100 % (01/31 1400) Weight:  [93 kg] 93 kg (01/31 0500)  Recent Labs  Lab 07/03/20 2312 07/04/20 0331 07/04/20 0750 07/04/20 1116 07/04/20 1518  GLUCAP 91 95 86 96 117*   Recent Labs  Lab 06/30/20 0710 06/30/20 0719 06/30/20 1405 07/01/20 0513 07/02/20 0715 07/03/20 0534 07/04/20 0707  NA 140 142 142 141 143 146* 145  K 3.5 3.6 3.4* 3.2* 3.7 3.8 3.8  CL 111 111  --  113* 115* 115* 114*  CO2 20*  --   --  17* 21* 20* 22  GLUCOSE 140* 135*  --  138* 135* 107* 92  BUN 14 17  --  13 11 12 11   CREATININE 1.41* 1.30*  --  1.44* 1.47* 1.26* 1.16*  CALCIUM 9.0  --   --  8.0* 8.2* 8.4* 8.8*   Recent Labs  Lab 06/30/20 0710 07/01/20 0513  AST 21 17  ALT 15 11  ALKPHOS 101 82  BILITOT 0.5 0.6  PROT 6.4* 5.3*  ALBUMIN 3.4* 2.8*   Recent Labs  Lab 06/30/20 0710 06/30/20 0719 07/01/20 0513 07/02/20 0715 07/02/20 1341 07/03/20 0534 07/04/20 0707  WBC 6.1  --  11.1* 9.5 10.3 9.3 7.0  NEUTROABS 3.6  --  9.5*  --   --   --   --   HGB 11.5*   < > 9.1* 7.9* 8.2* 8.5* 8.2*  HCT 36.3   < > 27.1* 24.2* 25.2* 25.2* 25.0*  MCV 88.5  --  84.4 85.8 86.3 86.9 86.2  PLT 507*  --  470* 387 369 385 402*   < > = values in this interval not displayed.   No results for input(s): CKTOTAL, CKMB, CKMBINDEX, TROPONINI in the last 168 hours. No results for input(s): LABPROT, INR in the last 72 hours. No results for  input(s): COLORURINE, LABSPEC, [place], GLUCOSEU, HGBUR, BILIRUBINUR, KETONESUR, PROTEINUR, UROBILINOGEN, NITRITE, LEUKOCYTESUR in the last 72 hours.  Invalid input(s): APPERANCEUR     Component Value Date/Time   CHOL 143 07/01/2020 0513   TRIG 172 (H) 07/01/2020 0513   TRIG 164 (H) 07/01/2020 0513   HDL 41 07/01/2020 0513   CHOLHDL 3.5 07/01/2020 0513   VLDL 34 07/01/2020 0513   LDLCALC 68 07/01/2020 0513   Lab Results  Component Value Date   HGBA1C 5.4 07/01/2020      Component Value Date/Time   LABOPIA NONE DETECTED 06/30/2020 0825   COCAINSCRNUR NONE DETECTED 06/30/2020 0825   LABBENZ NONE DETECTED 06/30/2020 0825   AMPHETMU NONE DETECTED 06/30/2020 0825   THCU NONE DETECTED 06/30/2020 0825   LABBARB NONE DETECTED 06/30/2020 0825    No results for input(s): ETH in the last 168 hours.  I have personally reviewed the radiological images below and agree with the radiology interpretations.  CT ABDOMEN PELVIS WO CONTRAST  Result Date: 07/02/2020 CLINICAL DATA:  Retroperitoneal hematoma.  Decreased hemoglobin. EXAM: CT  ABDOMEN AND PELVIS WITHOUT CONTRAST TECHNIQUE: Multidetector CT imaging of the abdomen and pelvis was performed following the standard protocol without IV contrast. COMPARISON:  CT dated 07/14/2019 FINDINGS: Lower chest: There is atelectasis versus aspiration at the lung bases.The heart is enlarged. The intracardiac blood pool is hypodense relative to the adjacent myocardium consistent with anemia. Hepatobiliary: The liver is normal. Status post cholecystectomy.There is no biliary ductal dilation. Pancreas: Normal contours without ductal dilatation. No peripancreatic fluid collection. Spleen: Unremarkable. Adrenals/Urinary Tract: --Adrenal glands: Unremarkable. --Right kidney/ureter: No hydronephrosis or radiopaque kidney stones. --Left kidney/ureter: No hydronephrosis or radiopaque kidney stones. --Urinary bladder: There is a Foley catheter in place. Stomach/Bowel:  --Stomach/Duodenum: Patient is status post prior gastric bypass. The enteric tube terminates in the proximal small bowel. --Small bowel: Unremarkable. --Colon: There is a large amount of stool at the level of the rectum. --Appendix: Not visualized. No right lower quadrant inflammation or free fluid. Vascular/Lymphatic: There is fat stranding in the right inguinal region with a small retroperitoneal hematoma on the right. --No retroperitoneal lymphadenopathy. --No mesenteric lymphadenopathy. --No pelvic or inguinal lymphadenopathy. Reproductive: Unremarkable Other: No ascites or free air. There is a small fat containing umbilical hernia. Musculoskeletal. No acute displaced fractures. IMPRESSION: 1. Fat stranding in the right inguinal region with a small retroperitoneal hematoma on the right. 2. Cardiomegaly. 3. Anemia. 4. Bibasilar atelectasis versus aspiration. 5. Large amount of stool at the level of the rectum. Electronically Signed   By: Constance Holster M.D.   On: 07/02/2020 18:17   CT Code Stroke CTA Head W/WO contrast  Result Date: 06/30/2020 CLINICAL DATA:  65 year old female code stroke presentation with left MCA ASPECTS 6. Left gaze deviation. EXAM: CT ANGIOGRAPHY HEAD AND NECK CT PERFUSION BRAIN TECHNIQUE: Multidetector CT imaging of the head and neck was performed using the standard protocol during bolus administration of intravenous contrast. Multiplanar CT image reconstructions and MIPs were obtained to evaluate the vascular anatomy. Carotid stenosis measurements (when applicable) are obtained utilizing NASCET criteria, using the distal internal carotid diameter as the denominator. Multiphase CT imaging of the brain was performed following IV bolus contrast injection. Subsequent parametric perfusion maps were calculated using RAPID software. CONTRAST:  13mL OMNIPAQUE IOHEXOL 350 MG/ML SOLN COMPARISON:  Plain head CT today 0721 hours. [facility] brain MRI  02/15/2016 FINDINGS: CT Brain Perfusion Findings: ASPECTS: 6 CBF (<30%) Volume: 92mL (erroneous). Using CBF less than 38% 11 mL of parenchyma is detected which does partially correspond to the area of cytotoxic edema by plain CT. Perfusion (Tmax>6.0s) volume: 25mL, hypoperfusion index 0.1 but also might be spurrious. Mismatch Volume: Calculated is not accurate due to erroneously low infarct core, estimated penumbra given the above is 60 to 67 mL. Infarction Location:Left MCA The above was discussed by telephone with Dr. Lesleigh Noe on 06/30/2020 at 07:49 . CTA NECK Skeleton: No acute osseous abnormality identified. Dystrophic/degenerative calcifications of the longus coli muscle insertion at C1-C2. Upper chest: Negative. Other neck: No acute finding. Incidental contrast reflux into a left posterior neck vein. Aortic arch: Slightly bovine arch configuration. No arch atherosclerosis. Right carotid system: Mildly tortuous proximal right CCA. Moderate calcified plaque at the right ICA origin with less than 50 % stenosis with respect to the distal vessel. Mild tortuosity. Left carotid system: Patent, mildly tortuous left CCA. Mild to moderate calcified plaque at the posterior left ICA origin with less than 50 % stenosis with respect to the distal vessel. Mildly tortuous. Vertebral arteries: Negative proximal  right subclavian artery and right vertebral artery origin. The right vertebral is mildly tortuous but patent to the skull base without stenosis. Moderate soft more than calcified plaque in the proximal left subclavian artery although no significant stenosis. Left vertebral artery origin remains normal. Codominant left vertebral artery is patent to the skull base without stenosis. CTA HEAD Posterior circulation: Mild right V4 calcified plaque. Patent distal vertebral arteries to the basilar with mild stenosis at the vertebrobasilar junction. Diminutive PICA. Left AICA appears dominant. Patent basilar artery is  diminutive but without stenosis. Patent SCA origins with fetal type bilateral PCA origins. Patent basilar tip. Bilateral PCA branches are within normal limits. Anterior circulation: Both ICA siphons are patent. No significant siphon plaque or stenosis. Normal posterior communicating artery origins. Patent carotid termini although the left MCA origin is occluded (series 10, image 19). There is some left MCA branch reconstitution as seen on series 13, image 43. Left ACA origin is normal. There is severe stenosis at the right ACA origin. Anterior communicating artery is diminutive. Bilateral A2 branches appear symmetric and within normal limits. Right MCA origin is patent but also irregular with mild stenosis. Right MCA M1 and right MCA bifurcation are patent, although with moderate stenosis at the posterior right MCA M2 origin (series 12, image 10). No right MCA branch occlusion identified. Venous sinuses: Early contrast timing, grossly patent. Anatomic variants: Fetal type PCA origins. Review of the MIP images confirms the above findings IMPRESSION: 1. Positive for emergent large vessel occlusion: Left MCA origin. Some left MCA branch reconstitution. 2. CT Perfusion underestimates infarct core (ASPECTS 6), which is estimated at 11-22 mL, subsequent estimated penumbra of 60 to 67 mL. 3. No other large vessel occlusion. But intracranial atherosclerosis with: - Severe stenosis Right ACA origin. - moderate stenosis Right MCA M2 branch origin. - mild stenosis Right MCA M1, also Vertebrobasilar junction. 4. Cervical carotid atherosclerosis without stenosis. #1 discussed by telephone with Dr. Lesleigh Noe on 06/30/2020 at 0732 hours. And [facility]ient CTP findings discussed at 0749 hours. Electronically Signed   By: Genevie Ann M.D.   On: 06/30/2020 07:58   CT HEAD WO CONTRAST  Result Date: 07/02/2020 CLINICAL DATA:  Stroke follow-up.  Intracranial hemorrhage. EXAM: CT HEAD WITHOUT CONTRAST TECHNIQUE: Contiguous axial images  were obtained from the base of the skull through the vertex without intravenous contrast. COMPARISON:  CT head 06/30/2020, MRI head 07/01/2020 FINDINGS: Brain: Left MCA infarct with hypodensity in the left temporal and parietal lobe as well as in the left basal ganglia involving the caudate and putamen. Small 1 cm hemorrhage left temporal lobe unchanged from prior CT and MRI. Small focus of subarachnoid hemorrhage in the left lateral temporal lobe which was present on the prior CT. Ventricle size normal. No midline shift. No new area of infarction. Vascular: Negative for hyperdense vessel Skull: Negative Sinuses/Orbits: Paranasal sinuses clear. NG tube in place. Negative orbit. Other: None IMPRESSION: Acute left MCA infarct unchanged from prior studies. Small amount of hemorrhage left temporal lobe also unchanged. No new area of hemorrhage or infarction. Electronically Signed   By: Franchot Gallo M.D.   On: 07/02/2020 17:49   CT HEAD WO CONTRAST  Result Date: 06/30/2020 CLINICAL DATA:  Stroke, left MCA occlusion post thrombectomy and stenting EXAM: CT HEAD WITHOUT CONTRAST TECHNIQUE: Contiguous axial images were obtained from the base of the skull through the vertex without intravenous contrast. COMPARISON:  Earlier same day FINDINGS: Brain: Probable few patchy areas of loss of gray-white differentiation are  identified in the left MCA territory. This is less apparent than on the prior study. There is some ill-defined patchy hyperdensity, most discrete in the superior left temporal lobe on axial series 3, image 14. There is no significant mass effect. No hydrocephalus. Small chronic right cerebellar infarct. Vascular: New stent of the left M1 MCA. Skull: Calvarium is unremarkable. Sinuses/Orbits: No acute finding. Other: None. IMPRESSION: Probable few small areas of acute left MCA infarction as before but less apparent. Ill-defined patchy hyperdensity may reflect contrast staining and/or reperfusion petechial  hemorrhage. No significant mass effect. Electronically Signed   By: Macy Mis M.D.   On: 06/30/2020 16:41   CT Code Stroke CTA Neck W/WO contrast  Result Date: 06/30/2020 CLINICAL DATA:  65 year old female code stroke presentation with left MCA ASPECTS 6. Left gaze deviation. EXAM: CT ANGIOGRAPHY HEAD AND NECK CT PERFUSION BRAIN TECHNIQUE: Multidetector CT imaging of the head and neck was performed using the standard protocol during bolus administration of intravenous contrast. Multiplanar CT image reconstructions and MIPs were obtained to evaluate the vascular anatomy. Carotid stenosis measurements (when applicable) are obtained utilizing NASCET criteria, using the distal internal carotid diameter as the denominator. Multiphase CT imaging of the brain was performed following IV bolus contrast injection. Subsequent parametric perfusion maps were calculated using RAPID software. CONTRAST:  156mL OMNIPAQUE IOHEXOL 350 MG/ML SOLN COMPARISON:  Plain head CT today 0721 hours. [facility] brain MRI 02/15/2016 FINDINGS: CT Brain Perfusion Findings: ASPECTS: 6 CBF (<30%) Volume: 109mL (erroneous). Using CBF less than 38% 11 mL of parenchyma is detected which does partially correspond to the area of cytotoxic edema by plain CT. Perfusion (Tmax>6.0s) volume: 12mL, hypoperfusion index 0.1 but also might be spurrious. Mismatch Volume: Calculated is not accurate due to erroneously low infarct core, estimated penumbra given the above is 60 to 67 mL. Infarction Location:Left MCA The above was discussed by telephone with Dr. Lesleigh Noe on 06/30/2020 at 07:49 . CTA NECK Skeleton: No acute osseous abnormality identified. Dystrophic/degenerative calcifications of the longus coli muscle insertion at C1-C2. Upper chest: Negative. Other neck: No acute finding. Incidental contrast reflux into a left posterior neck vein. Aortic arch: Slightly bovine arch configuration. No arch  atherosclerosis. Right carotid system: Mildly tortuous proximal right CCA. Moderate calcified plaque at the right ICA origin with less than 50 % stenosis with respect to the distal vessel. Mild tortuosity. Left carotid system: Patent, mildly tortuous left CCA. Mild to moderate calcified plaque at the posterior left ICA origin with less than 50 % stenosis with respect to the distal vessel. Mildly tortuous. Vertebral arteries: Negative proximal right subclavian artery and right vertebral artery origin. The right vertebral is mildly tortuous but patent to the skull base without stenosis. Moderate soft more than calcified plaque in the proximal left subclavian artery although no significant stenosis. Left vertebral artery origin remains normal. Codominant left vertebral artery is patent to the skull base without stenosis. CTA HEAD Posterior circulation: Mild right V4 calcified plaque. Patent distal vertebral arteries to the basilar with mild stenosis at the vertebrobasilar junction. Diminutive PICA. Left AICA appears dominant. Patent basilar artery is diminutive but without stenosis. Patent SCA origins with fetal type bilateral PCA origins. Patent basilar tip. Bilateral PCA branches are within normal limits. Anterior circulation: Both ICA siphons are patent. No significant siphon plaque or stenosis. Normal posterior communicating artery origins. Patent carotid termini although the left MCA origin is occluded (series 10, image 19). There is some left MCA  branch reconstitution as seen on series 13, image 43. Left ACA origin is normal. There is severe stenosis at the right ACA origin. Anterior communicating artery is diminutive. Bilateral A2 branches appear symmetric and within normal limits. Right MCA origin is patent but also irregular with mild stenosis. Right MCA M1 and right MCA bifurcation are patent, although with moderate stenosis at the posterior right MCA M2 origin (series 12, image 10). No right MCA branch  occlusion identified. Venous sinuses: Early contrast timing, grossly patent. Anatomic variants: Fetal type PCA origins. Review of the MIP images confirms the above findings IMPRESSION: 1. Positive for emergent large vessel occlusion: Left MCA origin. Some left MCA branch reconstitution. 2. CT Perfusion underestimates infarct core (ASPECTS 6), which is estimated at 11-22 mL, subsequent estimated penumbra of 60 to 67 mL. 3. No other large vessel occlusion. But intracranial atherosclerosis with: - Severe stenosis Right ACA origin. - moderate stenosis Right MCA M2 branch origin. - mild stenosis Right MCA M1, also Vertebrobasilar junction. 4. Cervical carotid atherosclerosis without stenosis. #1 discussed by telephone with Dr. Lesleigh Noe on 06/30/2020 at 0732 hours. And [facility]ient CTP findings discussed at 0749 hours. Electronically Signed   By: Genevie Ann M.D.   On: 06/30/2020 07:58   MR ANGIO HEAD WO CONTRAST  Result Date: 07/01/2020 CLINICAL DATA:  Stroke.  Post left MCA thrombectomy. EXAM: MRI HEAD WITHOUT CONTRAST MRA HEAD WITHOUT CONTRAST TECHNIQUE: Multiplanar, multiecho pulse sequences of the brain and surrounding structures were obtained without intravenous contrast. Angiographic images of the head were obtained using MRA technique without contrast. COMPARISON:  CT head 06/30/2020.  CTA head 06/30/2020 FINDINGS: MRI HEAD FINDINGS Brain: Acute infarct left MCA territory. There is involvement of the left basal ganglia including the caudate and putamen. There is infarct involving the superior left temporal lobe as well as the insula and the left parietal lobe. Small area of hemorrhage is present within the left posterior temporal lobe measuring approximately 1 cm. This was hyperdense on CT yesterday. Scattered small areas of acute infarct in the left frontal lobe. Small area of acute infarct in the right cerebellum and in the occipital white matter bilaterally. Ventricle size normal.  No midline shift.  No mass  lesion. Vascular: Normal arterial flow voids Skull and upper cervical spine: Negative Sinuses/Orbits: Mild mucosal edema paranasal sinuses. Negative orbit Other: None MRA HEAD FINDINGS Decreased signal in the carotid bilaterally at the skull base is felt to be artifact. This area appears widely patent on CTA. Decreased signal left cavernous carotid also artifact. Both cavernous carotids are widely patent on CTA. Moderate stenosis proximal right A1 segment. Mild stenosis right M1. Moderate stenosis right MCA bifurcation. Interval placement of stent in the left middle cerebral artery. There is flow related signal in left MCA branches indicating the stent is patent. Both vertebral arteries patent to the basilar. Basilar widely patent. Right PICA patent. Left AICA patent. Superior cerebellar and posterior cerebral arteries patent bilaterally. Fetal origin left posterior cerebral artery. Right posterior communicating artery is patent. IMPRESSION: Acute infarct left MCA territory involving the basal ganglia as well as the left temporal, frontal, and parietal lobes. Small area of hemorrhage in the left posterior temporal lobe. Additional small areas of acute infarct in the occipital white matter bilaterally and right cerebellum. Left MCA stent appears patent. Moderate intracranial atherosclerotic disease as above. Electronically Signed   By: Franchot Gallo M.D.   On: 07/01/2020 18:55   MR BRAIN WO CONTRAST  Result Date: 07/01/2020 CLINICAL DATA:  Stroke.  Post left MCA thrombectomy. EXAM: MRI HEAD WITHOUT CONTRAST MRA HEAD WITHOUT CONTRAST TECHNIQUE: Multiplanar, multiecho pulse sequences of the brain and surrounding structures were obtained without intravenous contrast. Angiographic images of the head were obtained using MRA technique without contrast. COMPARISON:  CT head 06/30/2020.  CTA head 06/30/2020 FINDINGS: MRI HEAD FINDINGS Brain: Acute infarct left MCA territory. There is involvement of the left basal  ganglia including the caudate and putamen. There is infarct involving the superior left temporal lobe as well as the insula and the left parietal lobe. Small area of hemorrhage is present within the left posterior temporal lobe measuring approximately 1 cm. This was hyperdense on CT yesterday. Scattered small areas of acute infarct in the left frontal lobe. Small area of acute infarct in the right cerebellum and in the occipital white matter bilaterally. Ventricle size normal.  No midline shift.  No mass lesion. Vascular: Normal arterial flow voids Skull and upper cervical spine: Negative Sinuses/Orbits: Mild mucosal edema paranasal sinuses. Negative orbit Other: None MRA HEAD FINDINGS Decreased signal in the carotid bilaterally at the skull base is felt to be artifact. This area appears widely patent on CTA. Decreased signal left cavernous carotid also artifact. Both cavernous carotids are widely patent on CTA. Moderate stenosis proximal right A1 segment. Mild stenosis right M1. Moderate stenosis right MCA bifurcation. Interval placement of stent in the left middle cerebral artery. There is flow related signal in left MCA branches indicating the stent is patent. Both vertebral arteries patent to the basilar. Basilar widely patent. Right PICA patent. Left AICA patent. Superior cerebellar and posterior cerebral arteries patent bilaterally. Fetal origin left posterior cerebral artery. Right posterior communicating artery is patent. IMPRESSION: Acute infarct left MCA territory involving the basal ganglia as well as the left temporal, frontal, and parietal lobes. Small area of hemorrhage in the left posterior temporal lobe. Additional small areas of acute infarct in the occipital white matter bilaterally and right cerebellum. Left MCA stent appears patent. Moderate intracranial atherosclerotic disease as above. Electronically Signed   By: Franchot Gallo M.D.   On: 07/01/2020 18:55   IR Intra Cran Stent  Result Date:  07/04/2020 INDICATION: Aphasia, left gaze deviation with right-sided weakness. 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 EXAM: 1. EMERGENT LARGE VESSEL OCCLUSION THROMBOLYSIS anterior CIRCULATION) COMPARISON:  CT angiogram of the head and neck of June 30, 2020. MEDICATIONS: Ancef 2 g IV antibiotic was administered within 1 hour of the procedure. ANESTHESIA/SEDATION: General anesthesia CONTRAST:  Isovue 300 approximately 120 mL FLUOROSCOPY TIME:  Fluoroscopy Time: 15 minutes 18 seconds (2667 mGy). COMPLICATIONS: None immediate. TECHNIQUE: Following a full explanation of the procedure along with the potential associated complications, an informed witnessed consent was obtained from the patient's daughter. The risks of intracranial hemorrhage of 10%, worsening neurological deficit, ventilator dependency, death and inability to revascularize were all reviewed in detail with the patient's daughter. The patient was then put under general anesthesia by the Department of Anesthesiology at [facility]. The right groin was prepped and draped in the usual sterile fashion. Thereafter using modified Seldinger technique, transfemoral access into the right common femoral artery was obtained without difficulty. Over a 0.035 inch guidewire an 8 [place] from 25 cm Pinnacle sheath was inserted. Through this, and also over a 0.035 inch guidewire a combination of Simmons 2 5.5 [place] support catheter inside of 087 balloon guide catheter combination was advanced to the aortic arch, and cannulation was  performed of the left common carotid artery. Over a 0.035 inch glide guidewire, the support catheter, and the 087 balloon guidewire were advanced to the left common carotid artery bifurcation. The guidewire and support Simmons 2 were removed. Good aspiration obtained from the hub of the balloon guide catheter. Control arteriogram performed through the balloon guide in the left common  carotid artery was performed. FINDINGS: The left common carotid arteriogram demonstrates the left external carotid artery and its major branches to be widely patent. The left internal carotid artery at the bulb to the cranial skull base demonstrates wide patency with moderate tortuosity in its mid cervical segment. The petrous, cavernous and supraclinoid segments demonstrate wide patency. 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 The left anterior cerebral artery demonstrates approximately 50% stenosis at its A1 segment. However, flow is noted distally into the left anterior cerebral artery distribution. Flash filling of the left posterior communicating artery is also demonstrated. PROCEDURE: Through the balloon guide catheter in the proximal left internal carotid artery, a combination of an 014 inch standard Synchro micro guidewire with an 021 Headway microcatheter and an 071 136 cm Zoom aspiration catheter was advanced as the combination to the supraclinoid left ICA. The balloon guide was advanced further distally into the distal cervical left ICA. Using a torque device, access was obtained into the occluded left middle cerebral artery into the inferior division branch M2 M3 region followed by the microcatheter. The guidewire was removed. Good aspiration obtained from the tip of the microcatheter. A gentle control arteriogram performed through microcatheter demonstrated safe position of the tip of the microcatheter which was now connected to continuous heparinized saline infusion. An [place] retrieval device was then advanced to the distal end of the microcatheter. The O ring on the delivery microcatheter was loosened. The Tiger retrieval device was retrieved and deployed such that the proximal marker was just inside the proximal portion of the occluded left middle cerebral artery. The 071 aspiration catheter was advanced to just proximal to the origin of the right  middle cerebral artery. Thereafter the retrieval device was expanded and decreased in size multiple times. 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 The retrieval device was again expanded right at the proximal aspect of the occlusion. Microcatheter was locked into position. Thereafter as constant aspiration was applied at the hub of the aspiration catheter at the origin of the left middle cerebral artery, and with the a 20 mL syringe at the hub of the balloon guide catheter in the left internal carotid artery with proximal occlusion for about 2 minutes, the combination of the retrieval device, the microcatheter and the Zoom aspiration device were retrieved and removed. Following reversal of flow arrest, control arteriogram performed through the balloon guide in the left internal carotid artery now demonstrated complete revascularization of the left middle cerebral artery distribution with a TICI 3 revascularization. However, this also unmasked a severe high-grade stenosis at the left MCA origin. At this point through the balloon guide in the left internal carotid artery, a combination of a 5 [place] Catalyst guide catheter inside of which was the 021 microcatheter was advanced over a 0.014 inch standard Synchro micro guidewire to the supraclinoid left ICA. At this time there was complete occlusion of the left middle cerebral artery due to recoil phenomenon due to the atherosclerotic plaque. Micro  guidewire was then gently advanced through the occluded left middle cerebral artery without difficulty in the superior division followed by the microcatheter. The guidewire was removed. Good aspiration obtained from the hub of the microcatheter. A gentle control arteriogram performed through this demonstrated safe position of the tip of the  microcatheter. This in turn was then replaced with an 014 inch supported 300 cm Synchro micro guidewire with a J-tip configuration under constant fluoroscopic guidance. The tip of the exchange micro guidewire was maintained as the microcatheter was removed. Measurements were then performed of the left middle cerebral artery in its most normal segment just distal to the occlusion. It was elected to proceed with placement of a 2.25 mm x 12 mm Synergy drug-eluting stent. This was a retrogradely prepped with heparinized saline infusion, and also with 50% contrast and 50% heparinized saline infusion. Using the rapid exchange technique, the stent delivery system was advanced without difficulty to the supraclinoid left ICA. The delivery of the stent was then advanced without difficulty through the occluded left middle cerebral artery. Its proximal marker was positioned just proximal to the occluded left middle cerebral artery proximally. Thereafter, a control inflation was then performed using micro inflation syringe device via micro tubing with the balloon inflated to approximately 2.1 mm where it was maintained for approximately 20 seconds. Thereafter, with the wire distal, the balloon was retrieved and removed. A control arteriogram performed through the 5 [place] guide catheter in the left internal carotid artery demonstrated complete angiographic revascularization of the left middle cerebral artery distribution achieving a TICI 3 revascularization. Also noted now was patency of the left posterior cerebral artery distribution via the posterior communicating artery. Control arteriograms were then performed at 15 and 30 minutes post deployment of the stent which continued to demonstrate excellent flow through the stented segment also with patency of the superior and inferior division branches into the more distal M4 and M5 regions of the left middle cerebral artery distribution. Final control arteriogram performed through  the balloon guide in the left internal carotid artery after removal of the 5 [place] Catalyst guide catheter, and also the exchange micro guidewire demonstrated continued excellent apposition and patency of the angioplastied segment of the left middle cerebral artery with no intra stent abnormalities. A distal perfusion was now noted into the M4 M5 regions. Moderate spasm at the middle cervical left ICA responded to 25 mcg of nitroglycerin intra-arterially. The left anterior cerebral artery remained widely patent with cross filling of the right anterior cerebral A2 segment as described earlier. Balloon guide was removed. The Pinnacle sheath was removed with successful hemostasis with an 8 French Angio-Seal closure device. Distal pulses remained Dopplerable in both feet unchanged. Patient was left intubated to protect her airway as per anesthesia. An immediate CT scan of the brain performed on the table demonstrated contrast stain in the left basal ganglia region, and also the left parietal subcortical area. Patient was loaded with aspirin 81 mg, and Brilinta 180 mg p.o. via an orogastric tube just prior to the balloon angioplasty. A loading dose of cangrelor IV was given right after placement of the stent, with a 4 hr low-dose infusion with a CT of the brain to follow. The patient's pupils were approximately 2 mm bilaterally though sluggish. The patient was then transferred to the PACU and then neuro ICU for post revascularization treatment. IMPRESSION: Status post endovascular complete revascularization of occluded left middle cerebral artery M1 segment with 1 pass with the Tiger 17 retrieval device and  proximal aspiration followed by placement of a 2.25 mm x 12 mm Synergy balloon mounted drug-eluting stent with achievement of a TICI 3 revascularization. PLAN: Follow-up in the clinic approximately 4 weeks post discharge. Electronically Signed   By: Luanne Bras M.D.   On: 07/01/2020 12:48   IR CT Head  Ltd  Result Date: 07/04/2020 INDICATION: Aphasia, left gaze deviation with right-sided weakness. 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 EXAM: 1. EMERGENT LARGE VESSEL OCCLUSION THROMBOLYSIS anterior CIRCULATION) COMPARISON:  CT angiogram of the head and neck of June 30, 2020. MEDICATIONS: Ancef 2 g IV antibiotic was administered within 1 hour of the procedure. ANESTHESIA/SEDATION: General anesthesia CONTRAST:  Isovue 300 approximately 120 mL FLUOROSCOPY TIME:  Fluoroscopy Time: 15 minutes 18 seconds (2667 mGy). COMPLICATIONS: None immediate. TECHNIQUE: Following a full explanation of the procedure along with the potential associated complications, an informed witnessed consent was obtained from the patient's daughter. The risks of intracranial hemorrhage of 10%, worsening neurological deficit, ventilator dependency, death and inability to revascularize were all reviewed in detail with the patient's daughter. The patient was then put under general anesthesia by the Department of Anesthesiology at [facility]. The right groin was prepped and draped in the usual sterile fashion. Thereafter using modified Seldinger technique, transfemoral access into the right common femoral artery was obtained without difficulty. Over a 0.035 inch guidewire an 8 [place] from 25 cm Pinnacle sheath was inserted. Through this, and also over a 0.035 inch guidewire a combination of Simmons 2 5.5 [place] support catheter inside of 087 balloon guide catheter combination was advanced to the aortic arch, and cannulation was performed of the left common carotid artery. Over a 0.035 inch glide guidewire, the support catheter, and the 087 balloon guidewire were advanced to the left common carotid artery bifurcation. The guidewire and support Simmons 2 were removed. Good aspiration obtained from the hub of the balloon guide catheter. Control arteriogram performed through the balloon guide in  the left common carotid artery was performed. FINDINGS: The left common carotid arteriogram demonstrates the left external carotid artery and its major branches to be widely patent. The left internal carotid artery at the bulb to the cranial skull base demonstrates wide patency with moderate tortuosity in its mid cervical segment. The petrous, cavernous and supraclinoid segments demonstrate wide patency. 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 The left anterior cerebral artery demonstrates approximately 50% stenosis at its A1 segment. However, flow is noted distally into the left anterior cerebral artery distribution. Flash filling of the left posterior communicating artery is also demonstrated. PROCEDURE: Through the balloon guide catheter in the proximal left internal carotid artery, a combination of an 014 inch standard Synchro micro guidewire with an 021 Headway microcatheter and an 071 136 cm Zoom aspiration catheter was advanced as the combination to the supraclinoid left ICA. The balloon guide was advanced further distally into the distal cervical left ICA. Using a torque device, access was obtained into the occluded left middle cerebral artery into the inferior division branch M2 M3 region followed by the microcatheter. The guidewire was removed. Good aspiration obtained from the tip of the microcatheter. A gentle control arteriogram performed through microcatheter demonstrated safe position of the tip of the microcatheter which was now connected to continuous heparinized saline infusion. An [place] retrieval device was then advanced to the distal end of the microcatheter. The O ring on the delivery microcatheter was  loosened. The Tiger retrieval device was retrieved and deployed such that the proximal marker was just inside the proximal portion of the occluded left middle cerebral artery. The 071 aspiration catheter was advanced to just proximal to the origin  of the right middle cerebral artery. Thereafter the retrieval device was expanded and decreased in size multiple times. 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 The retrieval device was again expanded right at the proximal aspect of the occlusion. Microcatheter was locked into position. Thereafter as constant aspiration was applied at the hub of the aspiration catheter at the origin of the left middle cerebral artery, and with the a 20 mL syringe at the hub of the balloon guide catheter in the left internal carotid artery with proximal occlusion for about 2 minutes, the combination of the retrieval device, the microcatheter and the Zoom aspiration device were retrieved and removed. Following reversal of flow arrest, control arteriogram performed through the balloon guide in the left internal carotid artery now demonstrated complete revascularization of the left middle cerebral artery distribution with a TICI 3 revascularization. However, this also unmasked a severe high-grade stenosis at the left MCA origin. At this point through the balloon guide in the left internal carotid artery, a combination of a 5 [place] Catalyst guide catheter inside of which was the 021 microcatheter was advanced over a 0.014 inch standard Synchro micro guidewire to the supraclinoid left ICA. At this time there was complete occlusion of the left middle cerebral artery due to recoil phenomenon due to the atherosclerotic plaque. Micro guidewire was then gently advanced through the occluded left middle cerebral artery without difficulty in the superior division followed by the microcatheter. The guidewire was removed. Good aspiration obtained from the hub of the microcatheter. A gentle control arteriogram performed through this demonstrated safe position of the  tip of the microcatheter. This in turn was then replaced with an 014 inch supported 300 cm Synchro micro guidewire with a J-tip configuration under constant fluoroscopic guidance. The tip of the exchange micro guidewire was maintained as the microcatheter was removed. Measurements were then performed of the left middle cerebral artery in its most normal segment just distal to the occlusion. It was elected to proceed with placement of a 2.25 mm x 12 mm Synergy drug-eluting stent. This was a retrogradely prepped with heparinized saline infusion, and also with 50% contrast and 50% heparinized saline infusion. Using the rapid exchange technique, the stent delivery system was advanced without difficulty to the supraclinoid left ICA. The delivery of the stent was then advanced without difficulty through the occluded left middle cerebral artery. Its proximal marker was positioned just proximal to the occluded left middle cerebral artery proximally. Thereafter, a control inflation was then performed using micro inflation syringe device via micro tubing with the balloon inflated to approximately 2.1 mm where it was maintained for approximately 20 seconds. Thereafter, with the wire distal, the balloon was retrieved and removed. A control arteriogram performed through the 5 [place] guide catheter in the left internal carotid artery demonstrated complete angiographic revascularization of the left middle cerebral artery distribution achieving a TICI 3 revascularization. Also noted now was patency of the left posterior cerebral artery distribution via the posterior communicating artery. Control arteriograms were then performed at 15 and 30 minutes post deployment of the stent which continued to demonstrate excellent flow through the stented segment also  with patency of the superior and inferior division branches into the more distal M4 and M5 regions of the left middle cerebral artery distribution. Final control arteriogram  performed through the balloon guide in the left internal carotid artery after removal of the 5 [place] Catalyst guide catheter, and also the exchange micro guidewire demonstrated continued excellent apposition and patency of the angioplastied segment of the left middle cerebral artery with no intra stent abnormalities. A distal perfusion was now noted into the M4 M5 regions. Moderate spasm at the middle cervical left ICA responded to 25 mcg of nitroglycerin intra-arterially. The left anterior cerebral artery remained widely patent with cross filling of the right anterior cerebral A2 segment as described earlier. Balloon guide was removed. The Pinnacle sheath was removed with successful hemostasis with an 8 French Angio-Seal closure device. Distal pulses remained Dopplerable in both feet unchanged. Patient was left intubated to protect her airway as per anesthesia. An immediate CT scan of the brain performed on the table demonstrated contrast stain in the left basal ganglia region, and also the left parietal subcortical area. Patient was loaded with aspirin 81 mg, and Brilinta 180 mg p.o. via an orogastric tube just prior to the balloon angioplasty. A loading dose of cangrelor IV was given right after placement of the stent, with a 4 hr low-dose infusion with a CT of the brain to follow. The patient's pupils were approximately 2 mm bilaterally though sluggish. The patient was then transferred to the PACU and then neuro ICU for post revascularization treatment. IMPRESSION: Status post endovascular complete revascularization of occluded left middle cerebral artery M1 segment with 1 pass with the Tiger 17 retrieval device and proximal aspiration followed by placement of a 2.25 mm x 12 mm Synergy balloon mounted drug-eluting stent with achievement of a TICI 3 revascularization. PLAN: Follow-up in the clinic approximately 4 weeks post discharge. Electronically Signed   By: Luanne Bras M.D.   On: 07/01/2020 12:48    CT Code Stroke Cerebral Perfusion with contrast  Result Date: 06/30/2020 CLINICAL DATA:  65 year old female code stroke presentation with left MCA ASPECTS 6. Left gaze deviation. EXAM: CT ANGIOGRAPHY HEAD AND NECK CT PERFUSION BRAIN TECHNIQUE: Multidetector CT imaging of the head and neck was performed using the standard protocol during bolus administration of intravenous contrast. Multiplanar CT image reconstructions and MIPs were obtained to evaluate the vascular anatomy. Carotid stenosis measurements (when applicable) are obtained utilizing NASCET criteria, using the distal internal carotid diameter as the denominator. Multiphase CT imaging of the brain was performed following IV bolus contrast injection. Subsequent parametric perfusion maps were calculated using RAPID software. CONTRAST:  139mL OMNIPAQUE IOHEXOL 350 MG/ML SOLN COMPARISON:  Plain head CT today 0721 hours. [facility] brain [facility] 02/15/2016 FINDINGS: CT Brain Perfusion Findings: ASPECTS: 6 CBF (<30%) Volume: 71mL (erroneous). Using CBF less than 38% 11 mL of parenchyma is detected which does partially correspond to the area of cytotoxic edema by plain CT. Perfusion (Tmax>6.0s) volume: 70mL, hypoperfusion index 0.1 but also might be spurrious. Mismatch Volume: Calculated is not accurate due to erroneously low infarct core, estimated penumbra given the above is 60 to 67 mL. Infarction Location:Left MCA The above was discussed by telephone with Dr. Lesleigh Noe on 06/30/2020 at 07:49 . CTA NECK Skeleton: No acute osseous abnormality identified. Dystrophic/degenerative calcifications of the longus coli muscle insertion at C1-C2. Upper chest: Negative. Other neck: No acute finding. Incidental contrast reflux into a left posterior neck vein. Aortic  arch: Slightly bovine arch configuration. No arch atherosclerosis. Right carotid system: Mildly tortuous proximal right CCA. Moderate calcified plaque at the right  ICA origin with less than 50 % stenosis with respect to the distal vessel. Mild tortuosity. Left carotid system: Patent, mildly tortuous left CCA. Mild to moderate calcified plaque at the posterior left ICA origin with less than 50 % stenosis with respect to the distal vessel. Mildly tortuous. Vertebral arteries: Negative proximal right subclavian artery and right vertebral artery origin. The right vertebral is mildly tortuous but patent to the skull base without stenosis. Moderate soft more than calcified plaque in the proximal left subclavian artery although no significant stenosis. Left vertebral artery origin remains normal. Codominant left vertebral artery is patent to the skull base without stenosis. CTA HEAD Posterior circulation: Mild right V4 calcified plaque. Patent distal vertebral arteries to the basilar with mild stenosis at the vertebrobasilar junction. Diminutive PICA. Left AICA appears dominant. Patent basilar artery is diminutive but without stenosis. Patent SCA origins with fetal type bilateral PCA origins. Patent basilar tip. Bilateral PCA branches are within normal limits. Anterior circulation: Both ICA siphons are patent. No significant siphon plaque or stenosis. Normal posterior communicating artery origins. Patent carotid termini although the left MCA origin is occluded (series 10, image 19). There is some left MCA branch reconstitution as seen on series 13, image 43. Left ACA origin is normal. There is severe stenosis at the right ACA origin. Anterior communicating artery is diminutive. Bilateral A2 branches appear symmetric and within normal limits. Right MCA origin is patent but also irregular with mild stenosis. Right MCA M1 and right MCA bifurcation are patent, although with moderate stenosis at the posterior right MCA M2 origin (series 12, image 10). No right MCA branch occlusion identified. Venous sinuses: Early contrast timing, grossly patent. Anatomic variants: Fetal type PCA origins.  Review of the MIP images confirms the above findings IMPRESSION: 1. Positive for emergent large vessel occlusion: Left MCA origin. Some left MCA branch reconstitution. 2. CT Perfusion underestimates infarct core (ASPECTS 6), which is estimated at 11-22 mL, subsequent estimated penumbra of 60 to 67 mL. 3. No other large vessel occlusion. But intracranial atherosclerosis with: - Severe stenosis Right ACA origin. - moderate stenosis Right MCA M2 branch origin. - mild stenosis Right MCA M1, also Vertebrobasilar junction. 4. Cervical carotid atherosclerosis without stenosis. #1 discussed by telephone with Dr. Lesleigh Noe on 06/30/2020 at 0732 hours. And [facility] CTP findings discussed at 0749 hours. Electronically Signed   By: Genevie Ann M.D.   On: 06/30/2020 07:58   DG CHEST PORT 1 VIEW  Result Date: 07/02/2020 CLINICAL DATA:  Fever, shortness of breath, hypertension. Status post LEFT MCA thrombectomy. EXAM: PORTABLE CHEST 1 VIEW COMPARISON:  Chest x-ray dated 06/30/2020. FINDINGS: Endotracheal tube has been removed. Enteric tube passes below the diaphragm. Heart size and mediastinal contours appear stable. Lungs are clear. No pleural effusion or pneumothorax is seen. IMPRESSION: No active disease. No evidence of pneumonia or pulmonary edema. Electronically Signed   By: Franki Cabot M.D.   On: 07/02/2020 11:01   Portable Chest x-ray  Result Date: 06/30/2020 CLINICAL DATA:  Evaluate endotracheal tube placement EXAM: PORTABLE CHEST 1 VIEW COMPARISON:  09/17/2019 FINDINGS: The tip of the ET tube appears oriented towards the left mainstem bronchus. Consider withdrawing by 2 cm. The enteric tube tip is in the proximal stomach. The side port for the enteric tube is just above the GE junction. Heart size normal. No pleural effusion, interstitial edema  or airspace consolidation. IMPRESSION: 1. ET tube tip appears oriented towards the left mainstem bronchus. Consider withdrawing by 2 cm. 2. Consider advancement of enteric  tube. 3. Lungs appear clear. 4. These results will be called to the ordering clinician or representative by the Radiologist Assistant, and communication documented in the PACS or [employer]. Electronically Signed   By: Kerby Moors M.D.   On: 06/30/2020 13:59   DG Abd Portable 1V  Result Date: 07/04/2020 CLINICAL DATA:  Enteric tube placement, history of gastric bypass EXAM: PORTABLE ABDOMEN - 1 VIEW COMPARISON:  07/01/2020 abdominal radiograph FINDINGS: Weighted enteric tube terminates in mid to lower left abdomen within a proximal small bowel loop. Cholecystectomy clips are seen in the right upper quadrant of the abdomen. Surgical sutures are noted in the medial upper abdomen bilaterally. No dilated small bowel loops. No evidence of pneumatosis or pneumoperitoneum. No radiopaque nephrolithiasis. IMPRESSION: Weighted enteric tube terminates in the mid to lower left abdomen within a proximal small bowel loop in this patient with a history of gastric bypass surgery. Electronically Signed   By: Ilona Sorrel M.D.   On: 07/04/2020 12:01   DG Abd Portable 1V  Result Date: 07/01/2020 CLINICAL DATA:  Feeding tube placement EXAM: PORTABLE ABDOMEN - 1 VIEW COMPARISON:  CT abdomen 07/14/2019 FINDINGS: A feeding tube is noted extending inferiorly into the left to terminate just above the left iliac crest, presumably in a somewhat dilated stomach given the low location. Otherwise unremarkable bowel gas pattern. Clips in the right upper quadrant likely from prior cholecystectomy. IMPRESSION: 1. The feeding tube extends inferiorly into the left abdomen, presumably in a somewhat dilated stomach given the low location. Otherwise unremarkable bowel gas pattern. 2. Status post cholecystectomy. Electronically Signed   By: Van Clines M.D.   On: 07/01/2020 16:08   DG Swallowing Func-Speech Pathology  Result Date: 07/04/2020 Objective Swallowing Evaluation: Type of Study: MBS-Modified Barium Swallow Study   Patient Details Name: Courtney Burke MRN: [id number] Date of Birth: [birth year] Today's Date: 07/04/2020 Time: SLP Start Time (ACUTE ONLY): 1419 -SLP Stop Time (ACUTE ONLY): 1434 SLP Time Calculation (min) (ACUTE ONLY): 15 min Past Medical History: Past Medical History: Diagnosis Date . Asthma  . Borderline diabetes  . Hypertension  Past Surgical History: Past Surgical History: Procedure Laterality Date . ANKLE SURGERY   . CARPAL TUNNEL RELEASE   . carpel tunnel   . IR CT HEAD LTD  06/30/2020 . IR INTRA CRAN STENT  06/30/2020 . IR PERCUTANEOUS ART THROMBECTOMY/INFUSION INTRACRANIAL INC DIAG ANGIO  06/30/2020 . RADIOLOGY WITH ANESTHESIA N/A 06/30/2020  Procedure: RADIOLOGY WITH ANESTHESIA;  Surgeon: Radiologist, Medication, MD;  Location: [facility];  Service: Radiology;  Laterality: N/A; HPI: Courtney Burke is a 65 y.o. 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 CT probable few small areas of acute left MCA infarction, less apparent. Ill-defined patchy hyperdensity may reflect contrast. Found to have M1 occlusion and underwent thrombectomy and intubated 1/27-1/28 am. MRI pending.  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 She cleared oral cavity with mechanical soft textures. Consistently lingual residue fell to pyriform sinuses after swallow with spontaneous subswallow to clear. Her intact strength and ROM allowed  her to achieve complete laryngeal protection. However, mistimed initiation of protection in addition to larger sip with straw, led to trace aspiration stopping just below her vocal cords. A weak reflexive throat clear audible after 10 second delay. Pt's  comprehension and execution of postures may not be consistent and able to rely on for safe consumption. Recommend initiate Dys 2 (fine chopped), thin liquids, NO straws, full supervision/assist, check for oral pocketing and pills whole in puree. Esophageal scan was unremarkable. SLP Visit Diagnosis Dysphagia, oropharyngeal phase (R13.12) Attention and concentration deficit following -- Frontal lobe and executive function deficit following -- Impact on safety and function Mild aspiration risk;Moderate aspiration risk   CHL IP TREATMENT RECOMMENDATION 07/04/2020 Treatment Recommendations Therapy as outlined in treatment plan below   Prognosis 07/04/2020 Prognosis for Safe Diet Advancement Good Barriers to Reach Goals -- Barriers/Prognosis Comment -- CHL IP DIET RECOMMENDATION 07/04/2020 SLP Diet Recommendations Dysphagia 2 (Fine chop) solids;Thin liquid Liquid Administration via No straw;Cup Medication Administration Whole meds with puree Compensations Slow rate;Small sips/bites;Lingual sweep for clearance of pocketing;Other (Comment) Postural Changes Seated upright at 90 degrees   CHL IP OTHER RECOMMENDATIONS 07/04/2020 Recommended Consults -- Oral Care Recommendations Oral care BID Other Recommendations --   CHL IP FOLLOW UP RECOMMENDATIONS 07/04/2020 Follow up Recommendations Inpatient Rehab   CHL IP FREQUENCY AND DURATION 07/04/2020 Speech Therapy Frequency (ACUTE ONLY) min 2x/week Treatment Duration 2 weeks      CHL IP ORAL PHASE 07/04/2020 Oral Phase Impaired Oral - Pudding Teaspoon -- Oral - Pudding Cup -- Oral - Honey Teaspoon -- Oral - Honey Cup -- Oral - Nectar Teaspoon -- Oral - Nectar Cup Delayed oral transit;Piecemeal swallowing Oral - Nectar Straw Delayed oral transit;Piecemeal swallowing Oral - Thin Teaspoon -- Oral - Thin Cup Right anterior bolus loss Oral - Thin Straw Delayed oral transit Oral - Puree Delayed oral transit;Other (Comment) Oral - Mech Soft Delayed oral transit Oral - Regular -- Oral -  Multi-Consistency -- Oral - Pill -- Oral Phase - Comment --  CHL IP PHARYNGEAL PHASE 07/04/2020 Pharyngeal Phase Impaired Pharyngeal- Pudding Teaspoon -- Pharyngeal -- Pharyngeal- Pudding Cup -- Pharyngeal -- Pharyngeal- Honey Teaspoon -- Pharyngeal -- Pharyngeal- Honey Cup -- Pharyngeal -- Pharyngeal- Nectar Teaspoon -- Pharyngeal -- Pharyngeal- Nectar Cup WFL Pharyngeal -- Pharyngeal- Nectar Straw WFL Pharyngeal Other (Comment) Pharyngeal- Thin Teaspoon -- Pharyngeal -- Pharyngeal- Thin Cup WFL Pharyngeal -- Pharyngeal- Thin Straw Penetration/Aspiration during swallow Pharyngeal Other (Comment) Pharyngeal- Puree WFL Pharyngeal -- Pharyngeal- Mechanical Soft WFL Pharyngeal -- Pharyngeal- Regular -- Pharyngeal -- Pharyngeal- Multi-consistency -- Pharyngeal -- Pharyngeal- Pill -- Pharyngeal -- Pharyngeal Comment --  CHL IP CERVICAL ESOPHAGEAL PHASE 07/04/2020 Cervical Esophageal Phase WFL Pudding Teaspoon -- Pudding Cup -- Honey Teaspoon -- Honey Cup -- Nectar Teaspoon -- Nectar Cup -- Nectar Straw -- Thin Teaspoon -- Thin Cup -- Thin Straw -- Puree -- Mechanical Soft -- Regular -- Multi-consistency -- Pill -- Cervical Esophageal Comment -- [place] Siren 07/04/2020, 3:24 PM Orbie Pyo Litaker M.Ed Actor Pager [phone] Office [phone]              EEG adult  Result Date: 07/01/2020 Lora Havens, MD     07/01/2020 10:52 AM Patient Name: Courtney Burke MRN: [id number] Epilepsy Attending: Lora Havens Referring Physician/Provider: Dr. Rosalin Hawking Date: 07/01/2020 Duration: 24.04 mins Patient history: 65 year old female with recent left MCA stroke status post thrombectomy. EEG to evaluate for seizures. Level of alertness: Awake AEDs during EEG study: None Technical aspects:  This EEG study was done with scalp electrodes positioned according to the 10-20 International system of electrode placement. Electrical activity was acquired at a sampling rate of 500Hz  and reviewed with a  high frequency filter of 70Hz  and a low frequency filter of 1Hz . EEG data were recorded continuously and digitally stored. Description: The posterior dominant rhythm consists of 8 Hz activity of moderate voltage (25-35 uV) seen predominantly in posterior head regions, symmetric and reactive to eye opening and eye closing.  EEG showed intermittent generalized and maximal left frontal region 3 to 5 Hz theta-delta slowing.  The 3 to 5 Hz theta and delta slowing in the left frontal region appeared sharply contoured and waxes and wanes without definite evolution.  Hyperventilation and photic stimulation were not performed.   ABNORMALITY -Frontal intermittent rhythmic delta activity, left frontal region -Intermittent slow, generalized and maximal left frontal region IMPRESSION: This study is suggestive of cortical dysfunction in left frontal region likely secondary underlying stroke as well as mild diffuse encephalopathy, nonspecific etiology.  Intermittent rhythmic delta activity in left frontal region is on the ictal-interictal continuum with low to intermediate potential for seizures.  No seizures or epileptiform discharges were seen throughout the recording. Lora Havens   ECHOCARDIOGRAM COMPLETE  Result Date: 07/01/2020    ECHOCARDIOGRAM REPORT   Patient Name:   [facility] Date of Exam: 07/01/2020 Medical Rec #:  [id number]     Height:       62.0 in Accession #:    [id number]    Weight:       207.0 lb Date of Birth:  [birth year]     BSA:          1.940 m Patient Age:    22 years      BP:           119/64 mmHg Patient Gender: F             HR:           81 bpm. Exam Location:  Inpatient Procedure: 2D Echo Indications:    Stroke I163.9  History:        Patient has no prior history of Echocardiogram examinations.                 Risk Factors:Hypertension.  Sonographer:    Mikki Santee RDCS (AE) Referring Phys: [place]  1. Left ventricular ejection fraction, by estimation, is 60  to 65%. The left ventricle has normal function. The left ventricle has no regional wall motion abnormalities. There is moderate left ventricular hypertrophy. Left ventricular diastolic parameters are consistent with Grade I diastolic dysfunction (impaired relaxation).  2. Right ventricular systolic function is normal. The right ventricular size is normal. There is normal pulmonary artery systolic pressure. The estimated right ventricular systolic pressure is 30.1 mmHg.  3. The mitral valve is grossly normal. Trivial mitral valve regurgitation.  4. The aortic valve is tricuspid. Aortic valve regurgitation is not visualized.  5. The inferior vena cava is normal in size with greater than 50% respiratory variability, suggesting right atrial pressure of 3 mmHg. Comparison(s): No prior Echocardiogram. FINDINGS  Left Ventricle: Left ventricular ejection fraction, by estimation, is 60 to 65%. The left ventricle has normal function. The left ventricle has no regional wall motion abnormalities. The left ventricular internal cavity size was normal in size. There is  moderate left ventricular hypertrophy. Left ventricular diastolic parameters are consistent with Grade I diastolic dysfunction (impaired relaxation). Indeterminate filling pressures.  Right Ventricle: The right ventricular size is normal. No increase in right ventricular wall thickness. Right ventricular systolic function is normal. There is normal pulmonary artery systolic pressure. The tricuspid regurgitant velocity is 2.71 m/s, and  with an assumed right atrial pressure of 3 mmHg, the estimated right ventricular systolic pressure is 38.2 mmHg. Left Atrium: Left atrial size was normal in size. Right Atrium: Right atrial size was normal in size. Pericardium: There is no evidence of pericardial effusion. Mitral Valve: The mitral valve is grossly normal. Trivial mitral valve regurgitation. Tricuspid Valve: The tricuspid valve is grossly normal. Tricuspid valve  regurgitation is trivial. Aortic Valve: The aortic valve is tricuspid. Aortic valve regurgitation is not visualized. Pulmonic Valve: The pulmonic valve was normal in structure. Pulmonic valve regurgitation is not visualized. Aorta: The aortic root and ascending aorta are structurally normal, with no evidence of dilitation. Venous: The inferior vena cava is normal in size with greater than 50% respiratory variability, suggesting right atrial pressure of 3 mmHg. IAS/Shunts: No atrial level shunt detected by color flow Doppler.  LEFT VENTRICLE PLAX 2D LVIDd:         3.60 cm  Diastology LVIDs:         2.40 cm  LV e' medial:    6.53 cm/s LV PW:         1.30 cm  LV E/e' medial:  9.4 LV IVS:        1.30 cm  LV e' lateral:   6.85 cm/s LVOT diam:     2.10 cm  LV E/e' lateral: 8.9 LV SV:         86 LV SV Index:   44 LVOT Area:     3.46 cm  RIGHT VENTRICLE RV S prime:     14.40 cm/s TAPSE (M-mode): 2.3 cm LEFT ATRIUM             Index       RIGHT ATRIUM           Index LA diam:        3.40 cm 1.75 cm/m  RA Area:     14.80 cm LA Vol (A2C):   36.6 ml 18.87 ml/m RA Volume:   35.40 ml  18.25 ml/m LA Vol (A4C):   48.9 ml 25.21 ml/m LA Biplane Vol: 44.7 ml 23.04 ml/m  AORTIC VALVE LVOT Vmax:   107.00 cm/s LVOT Vmean:  69.700 cm/s LVOT VTI:    0.248 m  AORTA Ao Root diam: 2.80 cm Ao Asc diam:  3.20 cm MITRAL VALVE               TRICUSPID VALVE MV Area (PHT): 2.87 cm    TR Peak grad:   29.4 mmHg MV Decel Time: 264 msec    TR Vmax:        271.00 cm/s MV E velocity: 61.10 cm/s MV A velocity: 99.90 cm/s  SHUNTS MV E/A ratio:  0.61        Systemic VTI:  0.25 m                            Systemic Diam: 2.10 cm Lyman Bishop MD Electronically signed by Lyman Bishop MD Signature Date/Time: 07/01/2020/1:26:54 PM    Final    IR PERCUTANEOUS ART THROMBECTOMY/INFUSION INTRACRANIAL INC DIAG ANGIO  Result Date: 07/04/2020 INDICATION: Aphasia, left gaze deviation with right-sided weakness. Occluded left middle cerebral artery M1 segment  on CT angiogram of the  head and neck. EXAM: 1. EMERGENT LARGE VESSEL OCCLUSION THROMBOLYSIS anterior CIRCULATION) COMPARISON:  CT angiogram of the head and neck of June 30, 2020. MEDICATIONS: Ancef 2 g IV antibiotic was administered within 1 hour of the procedure. ANESTHESIA/SEDATION: General anesthesia CONTRAST:  Isovue 300 approximately 120 mL FLUOROSCOPY TIME:  Fluoroscopy Time: 15 minutes 18 seconds (2667 mGy). COMPLICATIONS: None immediate. TECHNIQUE: Following a full explanation of the procedure along with the potential associated complications, an informed witnessed consent was obtained from the patient's daughter. The risks of intracranial hemorrhage of 10%, worsening neurological deficit, ventilator dependency, death and inability to revascularize were all reviewed in detail with the patient's daughter. The patient was then put under general anesthesia by the Department of Anesthesiology at [facility]. The right groin was prepped and draped in the usual sterile fashion. Thereafter using modified Seldinger technique, transfemoral access into the right common femoral artery was obtained without difficulty. Over a 0.035 inch guidewire an 8 [place] from 25 cm Pinnacle sheath was inserted. Through this, and also over a 0.035 inch guidewire a combination of Simmons 2 5.5 [place] support catheter inside of 087 balloon guide catheter combination was advanced to the aortic arch, and cannulation was performed of the left common carotid artery. Over a 0.035 inch glide guidewire, the support catheter, and the 087 balloon guidewire were advanced to the left common carotid artery bifurcation. The guidewire and support Simmons 2 were removed. Good aspiration obtained from the hub of the balloon guide catheter. Control arteriogram performed through the balloon guide in the left common carotid artery was performed. FINDINGS: The left common carotid arteriogram demonstrates the left external carotid artery and its  major branches to be widely patent. The left internal carotid artery at the bulb to the cranial skull base demonstrates wide patency with moderate tortuosity in its mid cervical segment. The petrous, cavernous and supraclinoid segments demonstrate wide patency. 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 The left anterior cerebral artery demonstrates approximately 50% stenosis at its A1 segment. However, flow is noted distally into the left anterior cerebral artery distribution. Flash filling of the left posterior communicating artery is also demonstrated. PROCEDURE: Through the balloon guide catheter in the proximal left internal carotid artery, a combination of an 014 inch standard Synchro micro guidewire with an 021 Headway microcatheter and an 071 136 cm Zoom aspiration catheter was advanced as the combination to the supraclinoid left ICA. The balloon guide was advanced further distally into the distal cervical left ICA. Using a torque device, access was obtained into the occluded left middle cerebral artery into the inferior division branch M2 M3 region followed by the microcatheter. The guidewire was removed. Good aspiration obtained from the tip of the microcatheter. A gentle control arteriogram performed through microcatheter demonstrated safe position of the tip of the microcatheter which was now connected to continuous heparinized saline infusion. An [place] retrieval device was then advanced to the distal end of the microcatheter. The O ring on the delivery microcatheter was loosened. The Tiger retrieval device was retrieved and deployed such that the proximal marker was just inside the proximal portion of the occluded left middle cerebral artery. The 071 aspiration catheter was advanced to just proximal to the origin of the right middle cerebral artery. Thereafter the retrieval device was expanded and decreased in size multiple times. 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 The retrieval device was again expanded right at the proximal aspect of the occlusion. Microcatheter was locked into position. Thereafter as constant aspiration was applied at the hub of the aspiration catheter at the origin of the left middle cerebral artery, and with the a 20 mL syringe at the hub of the balloon guide catheter in the left internal carotid artery with proximal occlusion for about 2 minutes, the combination of the retrieval device, the microcatheter and the Zoom aspiration device were retrieved and removed. Following reversal of flow arrest, control arteriogram performed through the balloon guide in the left internal carotid artery now demonstrated complete revascularization of the left middle cerebral artery distribution with a TICI 3 revascularization. However, this also unmasked a severe high-grade stenosis at the left MCA origin. At this point through the balloon guide in the left internal carotid artery, a combination of a 5 [place] Catalyst guide catheter inside of which was the 021 microcatheter was advanced over a 0.014 inch standard Synchro micro guidewire to the supraclinoid left ICA. At this time there was complete occlusion of the left middle cerebral artery due to recoil phenomenon due to the atherosclerotic plaque. Micro guidewire was then gently advanced through the occluded left middle cerebral artery without difficulty in the superior division followed by the microcatheter. The guidewire was removed. Good aspiration obtained from the hub of the microcatheter. A gentle control arteriogram performed through this demonstrated safe position of the tip of the microcatheter. This in turn was then replaced with an 014 inch supported 300 cm Synchro micro guidewire with a J-tip configuration under  constant fluoroscopic guidance. The tip of the exchange micro guidewire was maintained as the microcatheter was removed. Measurements were then performed of the left middle cerebral artery in its most normal segment just distal to the occlusion. It was elected to proceed with placement of a 2.25 mm x 12 mm Synergy drug-eluting stent. This was a retrogradely prepped with heparinized saline infusion, and also with 50% contrast and 50% heparinized saline infusion. Using the rapid exchange technique, the stent delivery system was advanced without difficulty to the supraclinoid left ICA. The delivery of the stent was then advanced without difficulty through the occluded left middle cerebral artery. Its proximal marker was positioned just proximal to the occluded left middle cerebral artery proximally. Thereafter, a control inflation was then performed using micro inflation syringe device via micro tubing with the balloon inflated to approximately 2.1 mm where it was maintained for approximately 20 seconds. Thereafter, with the wire distal, the balloon was retrieved and removed. A control arteriogram performed through the 5 [place] guide catheter in the left internal carotid artery demonstrated complete angiographic revascularization of the left middle cerebral artery distribution achieving a TICI 3 revascularization. Also noted now was patency of the left posterior cerebral artery distribution via the posterior communicating artery. Control arteriograms were then performed at 15 and 30 minutes post deployment of the stent which continued to demonstrate excellent flow through the stented segment also with patency of the superior and inferior division branches into the more distal M4 and M5 regions of the left middle cerebral artery distribution. Final control arteriogram performed through the balloon guide in the left internal carotid artery after removal of the 5 [place] Catalyst guide catheter, and also the exchange micro  guidewire demonstrated continued excellent apposition and patency of the angioplastied segment of the left middle cerebral artery with no intra stent abnormalities. A distal perfusion was  now noted into the M4 M5 regions. Moderate spasm at the middle cervical left ICA responded to 25 mcg of nitroglycerin intra-arterially. The left anterior cerebral artery remained widely patent with cross filling of the right anterior cerebral A2 segment as described earlier. Balloon guide was removed. The Pinnacle sheath was removed with successful hemostasis with an 8 French Angio-Seal closure device. Distal pulses remained Dopplerable in both feet unchanged. Patient was left intubated to protect her airway as per anesthesia. An immediate CT scan of the brain performed on the table demonstrated contrast stain in the left basal ganglia region, and also the left parietal subcortical area. Patient was loaded with aspirin 81 mg, and Brilinta 180 mg p.o. via an orogastric tube just prior to the balloon angioplasty. A loading dose of cangrelor IV was given right after placement of the stent, with a 4 hr low-dose infusion with a CT of the brain to follow. The patient's pupils were approximately 2 mm bilaterally though sluggish. The patient was then transferred to the PACU and then neuro ICU for post revascularization treatment. IMPRESSION: Status post endovascular complete revascularization of occluded left middle cerebral artery M1 segment with 1 pass with the Tiger 17 retrieval device and proximal aspiration followed by placement of a 2.25 mm x 12 mm Synergy balloon mounted drug-eluting stent with achievement of a TICI 3 revascularization. PLAN: Follow-up in the clinic approximately 4 weeks post discharge. Electronically Signed   By: Luanne Bras M.D.   On: 07/01/2020 12:48   CT HEAD CODE STROKE WO CONTRAST  Result Date: 06/30/2020 CLINICAL DATA:  Code stroke. 65 year old female with right side weakness, nonverbal. EXAM: CT  HEAD WITHOUT CONTRAST TECHNIQUE: Contiguous axial images were obtained from the base of the skull through the vertex without intravenous contrast. COMPARISON:  [facility] brain MRI 02/15/2016 FINDINGS: Brain: No ventriculomegaly. No midline shift, mass effect, or evidence of intracranial mass lesion. No acute intracranial hemorrhage identified. Asymmetric left MCA territory indistinct gray and white matter hypodensity compatible with cytotoxic edema (series 2, image 15, 13). Left insula, left M2 and M3 segments affected. Left lentiform also asymmetric on series 2, image 16. Contralateral right hemisphere and posterior fossa gray-white matter differentiation is preserved. There is a small chronic appearing right cerebellar infarct which is new from 2017 (series 2, image 10). No acute intracranial hemorrhage identified. Vascular: Mild Calcified atherosclerosis at the skull base. No suspicious intracranial vascular hyperdensity. Skull: Negative. Sinuses/Orbits: 6 Visualized paranasal sinuses and mastoids are clear. Other: Leftward gaze deviation. Visualized scalp soft tissues are within normal limits. ASPECTS [facility] Stroke Program Early CT Score) - Ganglionic level infarction (caudate, lentiform nuclei, internal capsule, insula, M1-M3 cortex): 3 - Supraganglionic infarction (M4-M6 cortex): 3 Total score (0-10 with 10 being normal): 6 IMPRESSION: 1. Acute Left MCA territory infarct. ASPECTS 6. No hemorrhage or mass effect at this time. 2. The above discussed by telephone with Dr. Curly Shores on 06/30/2020 at 07:32 . 3. 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 Electronically Signed   By: Genevie Ann M.D.   On: 06/30/2020 07:34   VAS [place] LOWER EXTREMITY VENOUS (DVT)  Result Date: 07/01/2020  Lower Venous DVT Study Other Indications: Embolic stroke. Risk Factors: None identified. Comparison Study: No previous exam Performing Technologist: Vonzell Schlatter RVT   Examination Guidelines: A complete evaluation includes B-mode imaging, spectral Doppler, color Doppler, and power Doppler as needed of all accessible portions of each vessel. Bilateral testing is considered an  integral part of a complete examination. Limited examinations for reoccurring indications may be performed as noted. The reflux portion of the exam is performed with the patient in reverse Trendelenburg.  +---------+---------------+---------+-----------+----------+--------------+ RIGHT    CompressibilityPhasicitySpontaneityPropertiesThrombus Aging +---------+---------------+---------+-----------+----------+--------------+ CFV      Full           Yes      Yes                                 +---------+---------------+---------+-----------+----------+--------------+ SFJ      Full                                                        +---------+---------------+---------+-----------+----------+--------------+ FV Prox  Full                                                        +---------+---------------+---------+-----------+----------+--------------+ FV Mid   Full                                                        +---------+---------------+---------+-----------+----------+--------------+ FV DistalFull                                                        +---------+---------------+---------+-----------+----------+--------------+ PFV      Full                                                        +---------+---------------+---------+-----------+----------+--------------+ POP      Full           Yes      Yes                                 +---------+---------------+---------+-----------+----------+--------------+ PTV      Full                                                        +---------+---------------+---------+-----------+----------+--------------+ PERO     Full                                                         +---------+---------------+---------+-----------+----------+--------------+   +---------+---------------+---------+-----------+----------+--------------+ LEFT     CompressibilityPhasicitySpontaneityPropertiesThrombus Aging +---------+---------------+---------+-----------+----------+--------------+ CFV  Full           Yes      Yes                                 +---------+---------------+---------+-----------+----------+--------------+ SFJ      Full                                                        +---------+---------------+---------+-----------+----------+--------------+ FV Prox  Full                                                        +---------+---------------+---------+-----------+----------+--------------+ FV Mid   Full                                                        +---------+---------------+---------+-----------+----------+--------------+ FV DistalFull                                                        +---------+---------------+---------+-----------+----------+--------------+ PFV      Full                                                        +---------+---------------+---------+-----------+----------+--------------+ POP      Full           Yes      Yes                                 +---------+---------------+---------+-----------+----------+--------------+ PTV      Full                                                        +---------+---------------+---------+-----------+----------+--------------+ PERO     Full                                                        +---------+---------------+---------+-----------+----------+--------------+     Summary: BILATERAL: - No evidence of deep vein thrombosis seen in the lower extremities, bilaterally. -No evidence of popliteal cyst, bilaterally. RIGHT: - Ultrasound characteristics of a ruptured Baker's Cyst are noted.   *See table(s) above for measurements and  observations. Electronically signed by Heath Lark on 07/01/2020 at 5:16:40 PM.    Final  PHYSICAL EXAM  GENERAL: Pleasant elderly African-American lady not in distress    HEENT: Unremarkable  EXTREMITIES: No edema   BACK: Normal  SKIN: Normal by inspection.    MENTAL STATUS: She is awake and alert.   she has clear severe global aphasia.  There is no verbal output.  She does focuses and tracks however.  She follows simple midline and one-step commands with gestures.  She makes some guttural noises but no audible words or sentences CRANIAL NERVES: Pupils are equal, round and reactive to light and accomodation; extra ocular movements are full, there is no significant nystagmus; visual fields -shows a right homonymous hemianopia; upper and lower facial muscles are normal in strength and symmetric, there is no flattening of the nasolabial folds; tongue is midline; uvula is midline; shoulder elevation is normal.  MOTOR: Right upper extremity 2/5, right lower extremity 3/5.  Left side 4/5.  COORDINATION: No dysmetria or tremors noted.  SENSATION: Reduced sensation to pain on the right side.        ASSESSMENT/PLAN Courtney Burke is a 65 y.o. female with history of hypertension, diabetes, CKD, and obesity status post gastric bypass in 05/2020 admitted for confusion, seizure-like activity, drooling, bowel incontinence, and left gaze and right-sided weakness. No tPA given due to outside window.  Status post IR for left MCA occlusion.  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  Aspects 6  CT head and neck left MCA origin occlusion with some left MCA branch reconstitution.  Severe stenosis right ICA origin, moderate stenosis right M2 origin, mild stenosis right M1 and VB junction  MRI / MRA - Acute  infarct left MCA territory involving the basal ganglia as well as the left temporal, frontal, and parietal lobes. Small area of hemorrhage in the left posterior temporal lobe. Additional small areas of acute infarct in the occipital white matter bilaterally and right cerebellum. Left MCA stent appears patent. Moderate intracranial atherosclerotic disease.  CT Abdomen and Pelvis - small retroperitoneal hematoma.  2D Echo EF 60 to 65%  LE venous Doppler Negative for DVT  Recommend loop recorder to rule out A. fib on discharge  LDL 68  HgbA1c 5.4  Lovenox for VTE prophylaxis  aspirin 81 mg daily prior to admission, now on aspirin 81 mg daily and Brilinta (ticagrelor) 90 mg bid.   Ongoing aggressive stroke risk factor management  Therapy recommendations: CIR - Rehab MD consult pending for possible inpt rehab admission.  Disposition: Pending  Seizure like activity  At home with eyes rolling back, unresponsiveness, bowel incontinence, moaning and drooling  EEG Intermittent rhythmic delta activity in left frontal region is on the ictal-interictal continuum with low to intermediate potential for seizures.  Continue Keppra 500 twice daily  Diabetes  HgbA1c 5.4 goal < 7.0  Controlled  CBG monitoring  SSI  DM education and close PCP follow up  Hypertension . Stable (no scheduled BP meds at this time) . BP 120-140 within 24 h of IR  Long term BP goal normotensive  Hyperlipidemia  Home meds: Lipitor 40  LDL 68, goal < 70  Now on Lipitor 80  Continue statin at discharge  Dysphagia  Did not pass a swallow  Keep n.p.o.  Status post cortrak feeding tube replacement today  IV fluid and tube feeding  Other Stroke Risk Factors  Advanced age  Obesity, Body mass index is 37.5 kg/m.  Status post gastric bypass in 05/2020  Other Active Problems  CKD creatinine 1.3-> 1.44 - IV fluid and tube feeding - 1.47->1.26->1.16  NPO - tube feeding - free water 200 ml Q 4  hours  Hypernatremia - Na - 146 - free water 200 ml Q 4 hours added 07/03/20 per CCM. Today Na is 145.   Intubated -> extubated 07/01/20  Hypokalemia K 3.8 today   Anemia - Hgb - 11.2->9.9->9.1->7.9->8.2->8.5->8.2 - Check stool for Hemoccult; consider transfusion if drops below 7. Occult blood card - negative x 1 ; CT Abdomen and Pelvis - small retroperitoneal hematoma.  Small area of hemorrhage in the left posterior temporal lobe by MRI 07/01/20. (consider CT scan if neuro changes).  Right now the benefit of dual antiplatelet agents outweigh the risk. Close follow-up is warranted however.  CCM goals of care discussion with family yesterday -> Obtain PEG tube, possible transfer out of ICU and talk with CM about SNF placement. (Rehab MD consult pending for possible inpt rehab admission) (CCM plans to contact IR for PEG placement)  Appreciate CCM assistance.  Hospital day # 4 Recommend speech therapy for swallow eval and if patient fails will need reinsertion of core track tube and nutrition and medications.  Continue ongoing therapies.  Patient has a small retroperitoneal [place] and decreased hematocrit but unfortunately needs to be on dual antiplatelet therapy given her fresh intracranial stent risk-benefit carefully weighed.  Hopefully transfer to rehab in the next few days if stable.  Discussed with Dr. Cyndia Skeeters.  Greater than 50% time during this 25-minute visit were spent in counseling and coordination of care about her stroke and aphasia discussion with care team.  Antony Contras   To contact Stroke Continuity provider, please refer to [email or web address] After hours, contact General Neurology

## 2020-07-04 NOTE — Progress Notes (Addendum)
Progress Note    Courtney Burke  XYI:[id number] DOB: [birth year]  DOA: 06/30/2020 PCP: [name], PA-C      Brief Narrative:    Medical records reviewed and are as summarized below:  Courtney Burke is a 65 y.o. female with a PMHx of HTN, DM, CKD III, asthma, Vit D deficiency, obesity s/p gastric sleeve 12/21 who presented to [facility] via [facility] as a CODE STROKE. Per daughter, patient was last seen normal at bedtime 2100 hrs 06/29/20.  Reportedly, she was confused and tests 2-steroid nonfrozen.  She was also seen with eyes rolling back in her head and she lost consciousness briefly for a couple of times.  Apparently, she also had some right-sided weakness on presentation.  She was found to have acute left MCA stroke with M1 occlusion.  TPA was not given because she was out of the TPA window.  She was seen by the interventional radiologist and left MCA thrombectomy and stent placement was performed on 06/30/2020.    Assessment/Plan:   Active Problems:   Acute ischemic left MCA stroke (HCC)   Acute stroke due to occlusion of left middle cerebral artery (HCC)   Nutrition Problem: Inadequate oral intake Etiology: inability to eat  Signs/Symptoms: NPO status   Body mass index is 37.5 kg/m.  (Morbid obesity)    Acute left MCA stroke with LVO: S/p left MCA thrombectomy and stent placement.  Continue aspirin, Brilinta and Lipitor. Continue Keppra for seizure-like activity prior to admission. Neurologist recommended loop recorder prior to discharge. PT recommends inpatient rehab  Dysphagia: Per IR, patient is not a candidate for PEG tube placement because of recent surgery.  Consult general surgeon for jejunal tube placement.  I spoke to Earnstine Regal, surgery PA, who said he will notify trauma surgeon for jejunal tube placement.  Follow-up with speech therapist.  NGT was placed today for nutrition and medications.  Hypertension: Continue antihypertensives  Type II  DM: NovoLog as needed for hyperglycemia  Hyperlipidemia: Continue Lipitor  Acute blood loss anemia, small right-sided retroperitoneal hematoma: H&H is stable.  CKD stage IIIa: Creatinine is stable  Plan of care was discussed with the patient's daughter at the bedside.   ADDENDUM:  Patient had modified barium swallow study today and it showed that her swallowing not improved.  Speech therapist recommended dysphagia 2 diet.  Plan for jejunal tube placement by general surgery has been aborted.   Diet Order            Diet NPO time specified  Diet effective now                    Consultants:  Neurologist  Intensivist  Interventional radiologist  Procedures:  Left MCA stent on 06/30/2020    Medications:   . aspirin  81 mg Oral Daily   Or  . aspirin  81 mg Per Tube Daily  . atorvastatin  80 mg Per Tube QHS  . bethanechol  25 mg Per Tube TID  . chlorhexidine gluconate (MEDLINE KIT)  15 mL Mouth Rinse BID  . Chlorhexidine Gluconate Cloth  6 each Topical Daily  . docusate  100 mg Per Tube BID  . enoxaparin (LOVENOX) injection  40 mg Subcutaneous Q24H  . feeding supplement (PROSource TF)  45 mL Per Tube BID  . free water  200 mL Per Tube Q4H  . insulin aspart  0-15 Units Subcutaneous TID WC  . levETIRAcetam  500 mg Per Tube BID  .  multivitamin with minerals  1 tablet Per Tube BID  . pantoprazole (PROTONIX) IV  40 mg Intravenous Daily  . polyethylene glycol  17 g Per Tube Daily  . senna-docusate  1 tablet Per Tube QHS  . sodium chloride flush  3 mL Intravenous Once  . ticagrelor  90 mg Oral BID   Or  . ticagrelor  90 mg Per Tube BID   Continuous Infusions: . sodium chloride 25 mL/hr at 07/01/20 2100  . feeding supplement (OSMOLITE 1.2 CAL) Stopped (07/03/20 0130)     Anti-infectives (From admission, onward)   Start     Dose/Rate Route Frequency Ordered Stop   06/30/20 0801  ceFAZolin (ANCEF) 2-4 GM/100ML-% IVPB       Note to Pharmacy: Fredric Dine    : cabinet override      06/30/20 0801 06/30/20 2014             Family Communication/Anticipated D/C date and plan/Code Status   DVT prophylaxis: enoxaparin (LOVENOX) injection 40 mg Start: 07/01/20 2200 SCD's Start: 06/30/20 5027     Code Status: Full Code  Family Communication: Discussed with her daughter, Courtney Burke. Disposition Plan:    Status is: Inpatient  Remains inpatient appropriate because:Unsafe d/c plan and Inpatient level of care appropriate due to severity of illness   Dispo: The patient is from: Home              Anticipated d/c is to: CIR              Anticipated d/c date is: 2 days              Patient currently is not medically stable to d/c.   Difficult to place patient No           Subjective:   Interval events noted.  Patient is aphasic and she is unable to provide any history.  Courtney Burke, daughter at the bedside.  Objective:    Vitals:   07/04/20 0500 07/04/20 0600 07/04/20 0700 07/04/20 0800  BP: 140/72 (!) 154/75 (!) 147/86 (!) 164/70  Pulse: 73 75 77 71  Resp: 20 (!) _0 Temp:    98.6 F (37 C)  TempSrc:    Oral  SpO2: 99% 99% 100% 100%  Weight: 93 kg     Height:       No data found.   Intake/Output Summary (Last 24 hours) at 07/04/2020 1054 Last data filed at 07/04/2020 0800 Gross per 24 hour  Intake 0 ml  Output 2900 ml  Net -2900 ml   Filed Weights   07/02/20 0500 07/03/20 0500 07/04/20 0500  Weight: 93 kg 93.3 kg 93 kg    Exam:  GEN: NAD SKIN: Warm and dry EYES: EOMI ENT: MMM CV: RRR PULM: CTA B ABD: soft, obese, NT, +BS CNS: Aphasic.  She moves all 4 extremities spontaneously. EXT: No edema or tenderness   Data Reviewed:   I have personally reviewed following labs and imaging studies:  Labs: Labs show the following:   Basic Metabolic Panel: Recent Labs  Lab 06/30/20 0710 06/30/20 0719 06/30/20 1405 07/01/20 0513 07/02/20 0715 07/03/20 0534 07/04/20 0707  NA 140 142 142 141 143 146*  145  K 3.5 3.6 3.4* 3.2* 3.7 3.8 3.8  CL 111 111  --  113* 115* 115* 114*  CO2 20*  --   --  17* 21* 20* 22  GLUCOSE 140* 135*  --  138* 135* 107* 92  BUN 14 17  --  _0 CREATININE 1.41* 1.30*  --  1.44* 1.47* 1.26* 1.16*  CALCIUM 9.0  --   --  8.0* 8.2* 8.4* 8.8*   GFR Estimated Creatinine Clearance: 51.4 mL/min (A) (by C-G formula based on SCr of 1.16 mg/dL (H)). Liver Function Tests: Recent Labs  Lab 06/30/20 0710 07/01/20 0513  AST 21 17  ALT 15 11  ALKPHOS 101 82  BILITOT 0.5 0.6  PROT 6.4* 5.3*  ALBUMIN 3.4* 2.8*   No results for input(s): LIPASE, AMYLASE in the last 168 hours. No results for input(s): AMMONIA in the last 168 hours. Coagulation profile Recent Labs  Lab 06/30/20 0710  INR 1.1    CBC: Recent Labs  Lab 06/30/20 0710 06/30/20 0719 07/01/20 0513 07/02/20 0715 07/02/20 1341 07/03/20 0534 07/04/20 0707  WBC 6.1  --  11.1* 9.5 10.3 9.3 7.0  NEUTROABS 3.6  --  9.5*  --   --   --   --   HGB 11.5*   < > 9.1* 7.9* 8.2* 8.5* 8.2*  HCT 36.3   < > 27.1* 24.2* 25.2* 25.2* 25.0*  MCV 88.5  --  84.4 85.8 86.3 86.9 86.2  PLT 507*  --  470* 387 369 385 402*   < > = values in this interval not displayed.   Cardiac Enzymes: No results for input(s): CKTOTAL, CKMB, CKMBINDEX, TROPONINI in the last 168 hours. BNP (last 3 results) No results for input(s): PROBNP in the last 8760 hours. CBG: Recent Labs  Lab 07/03/20 1530 07/03/20 1942 07/03/20 2312 07/04/20 0331 07/04/20 0750  GLUCAP 94 97 91 95 86   D-Dimer: No results for input(s): DDIMER in the last 72 hours. Hgb A1c: No results for input(s): HGBA1C in the last 72 hours. Lipid Profile: No results for input(s): CHOL, HDL, LDLCALC, TRIG, CHOLHDL, LDLDIRECT in the last 72 hours. Thyroid function studies: No results for input(s): TSH, T4TOTAL, T3FREE, THYROIDAB in the last 72 hours.  Invalid input(s): FREET3 Anemia work up: No results for input(s): VITAMINB12, FOLATE, FERRITIN, TIBC,  IRON, RETICCTPCT in the last 72 hours. Sepsis Labs: Recent Labs  Lab 07/02/20 0715 07/02/20 1341 07/03/20 0534 07/04/20 0707  WBC 9.5 10.3 9.3 7.0    Microbiology Recent Results (from the past 240 hour(s))  SARS Coronavirus 2 by RT PCR (hospital order, performed in [facility] hospital lab) Nasopharyngeal Nasopharyngeal Swab     Status: None   Collection Time: 06/30/20  7:36 AM   Specimen: Nasopharyngeal Swab  Result Value Ref Range Status   SARS Coronavirus 2 NEGATIVE NEGATIVE Final    Comment: (NOTE) SARS-CoV-2 target nucleic acids are NOT DETECTED.  The SARS-CoV-2 RNA is generally detectable in upper and lower respiratory specimens during the acute phase of infection. The lowest concentration of SARS-CoV-2 viral copies this assay can detect is 250 copies / mL. A negative result does not preclude SARS-CoV-2 infection and should not be used as the sole basis for treatment or other patient management decisions.  A negative result may occur with improper specimen collection / handling, submission of specimen other than nasopharyngeal swab, presence of viral mutation(s) within the areas targeted by this assay, and inadequate number of viral copies (<250 copies / mL). A negative result must be combined with clinical observations, patient history, and epidemiological information.  Fact Sheet for Patients:   StrictlyIdeas.no  Fact Sheet for Healthcare Providers: BankingDealers.co.za  This test is not yet approved or  cleared by the [place] FDA and has been authorized  for detection and/or diagnosis of SARS-CoV-2 by FDA under an Emergency Use Authorization (EUA).  This EUA will remain in effect (meaning this test can be used) for the duration of the COVID-19 declaration under Section 564(b)(1) of the Act, 21 U.S.C. section 360bbb-3(b)(1), unless the authorization is terminated or revoked sooner.  Performed at [facility], [place] [address] [place], [place] [address]   MRSA PCR Screening     Status: None   Collection Time: 06/30/20  1:35 PM   Specimen: Nasal Mucosa; Nasopharyngeal  Result Value Ref Range Status   MRSA by PCR NEGATIVE NEGATIVE Final    Comment:        The GeneXpert MRSA Assay (FDA approved for NASAL specimens only), is one component of a comprehensive MRSA colonization surveillance program. It is not intended to diagnose MRSA infection nor to guide or monitor treatment for MRSA infections. Performed at [facility], [place] [address], [place], [place] [address]     Procedures and diagnostic studies:  CT ABDOMEN PELVIS WO CONTRAST  Result Date: 07/02/2020 CLINICAL DATA:  Retroperitoneal hematoma.  Decreased hemoglobin. EXAM: CT ABDOMEN AND PELVIS WITHOUT CONTRAST TECHNIQUE: Multidetector CT imaging of the abdomen and pelvis was performed following the standard protocol without IV contrast. COMPARISON:  CT dated 07/14/2019 FINDINGS: Lower chest: There is atelectasis versus aspiration at the lung bases.The heart is enlarged. The intracardiac blood pool is hypodense relative to the adjacent myocardium consistent with anemia. Hepatobiliary: The liver is normal. Status post cholecystectomy.There is no biliary ductal dilation. Pancreas: Normal contours without ductal dilatation. No peripancreatic fluid collection. Spleen: Unremarkable. Adrenals/Urinary Tract: --Adrenal glands: Unremarkable. --Right kidney/ureter: No hydronephrosis or radiopaque kidney stones. --Left kidney/ureter: No hydronephrosis or radiopaque kidney stones. --Urinary bladder: There is a Foley catheter in place. Stomach/Bowel: --Stomach/Duodenum: Patient is status post prior gastric bypass. The enteric tube terminates in the proximal small bowel. --Small bowel: Unremarkable. --Colon: There is a large amount of stool at the level of the rectum. --Appendix: Not visualized. No right lower quadrant inflammation or free fluid.  Vascular/Lymphatic: There is fat stranding in the right inguinal region with a small retroperitoneal hematoma on the right. --No retroperitoneal lymphadenopathy. --No mesenteric lymphadenopathy. --No pelvic or inguinal lymphadenopathy. Reproductive: Unremarkable Other: No ascites or free air. There is a small fat containing umbilical hernia. Musculoskeletal. No acute displaced fractures. IMPRESSION: 1. Fat stranding in the right inguinal region with a small retroperitoneal hematoma on the right. 2. Cardiomegaly. 3. Anemia. 4. Bibasilar atelectasis versus aspiration. 5. Large amount of stool at the level of the rectum. Electronically Signed   By: Constance Holster M.D.   On: 07/02/2020 18:17   CT HEAD WO CONTRAST  Result Date: 07/02/2020 CLINICAL DATA:  Stroke follow-up.  Intracranial hemorrhage. EXAM: CT HEAD WITHOUT CONTRAST TECHNIQUE: Contiguous axial images were obtained from the base of the skull through the vertex without intravenous contrast. COMPARISON:  CT head 06/30/2020, MRI head 07/01/2020 FINDINGS: Brain: Left MCA infarct with hypodensity in the left temporal and parietal lobe as well as in the left basal ganglia involving the caudate and putamen. Small 1 cm hemorrhage left temporal lobe unchanged from prior CT and MRI. Small focus of subarachnoid hemorrhage in the left lateral temporal lobe which was present on the prior CT. Ventricle size normal. No midline shift. No new area of infarction. Vascular: Negative for hyperdense vessel Skull: Negative Sinuses/Orbits: Paranasal sinuses clear. NG tube in place. Negative orbit. Other: None IMPRESSION: Acute left MCA infarct unchanged from prior  studies. Small amount of hemorrhage left temporal lobe also unchanged. No new area of hemorrhage or infarction. Electronically Signed   By: Franchot Gallo M.D.   On: 07/02/2020 17:49               LOS: 4 days   Marializ Ferrebee  [facility]   Pager on [email or web address]. If 7PM-7AM, please contact  night-coverage at [email or web address]     07/04/2020, 10:54 AM

## 2020-07-04 NOTE — Consult Note (Signed)
Physical Medicine and Rehabilitation Consult Reason for Consult: Right side weakness and aphasia Referring Physician: Dr.Xu   HPI: Courtney Burke is a 65 y.o. 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  Presented 06/30/2020 with altered mental status and aphasia.  Cranial CT scan showed acute left MCA territory infarction no hemorrhage or mass-effect.  Patient did not receive TPA.  EEG negative for seizure.  CT angiogram head and neck positive for emergent large vessel occlusion.  Left MCA origin.  Some left MCA branch reconstitution  and underwent MCA stenting per interventional radiology.  MRI follow-up showed acute infarct left MCA territory involving the basal ganglia as well as left temporal frontal and parietal lobes.  Small amount of hemorrhage in the left posterior temporal lobe.  Additional small areas of acute infarct in the occipital white matter bilaterally and right cerebellum.  Echocardiogram with ejection fraction of 60 to 65% no wall motion abnormalities grade 1 diastolic dysfunction.  Currently maintained on aspirin 81 mg daily as well as Brilinta for CVA prophylaxis.  Subcutaneous Lovenox for DVT prophylaxis.  Patient is maintained on Keppra for seizure prophylaxis.  Patient is currently n.p.o. with alternative means of nutritional support as patient did pull out her NG tube there was consideration of possible need for PEG tube.  Bouts of urinary retention placed on Urecholine. Physical Medicine & Rehabilitation was consulted to assess candidacy for CIR given global aphasia and right sided weakness.    Review of Systems  Unable to perform ROS: Acuity of condition   Past Medical History:  Diagnosis Date  . Asthma   . Borderline diabetes   . Hypertension    Past Surgical History:  Procedure Laterality Date  . ANKLE SURGERY    . CARPAL TUNNEL RELEASE    .  carpel tunnel    . RADIOLOGY WITH ANESTHESIA N/A 06/30/2020   Procedure: RADIOLOGY WITH ANESTHESIA;  Surgeon: Radiologist, Medication, MD;  Location: [facility];  Service: Radiology;  Laterality: N/A;   History reviewed. No pertinent family history. Social History:  reports that she has never smoked. She has never used smokeless tobacco. She reports that she does not drink alcohol and does not use drugs. Allergies:  Allergies  Allergen Reactions  . Iodinated Diagnostic Agents Itching  . Doxycycline Nausea And Vomiting  . Sulfa Antibiotics Rash and Hives  . Codeine Hives and Swelling    Swollen tongue  . Hydrocodone-Homatropine Itching  . Ace Inhibitors Cough, Itching and Rash   Medications Prior to Admission  Medication Sig Dispense Refill  . acetaminophen (TYLENOL) 500 MG tablet Take 500-1,000 mg by mouth every 6 (six) hours as needed for headache (pain).    . naproxen sodium (ALEVE) 220 MG tablet Take 660-880 mg by mouth 2 (two) times daily as needed (pain/headaches).      Home: Home Living Family/patient expects to be discharged to:: Unsure Additional Comments: pt with expressive and receptive aphasia, unable to report history, no family present at the time of evaluation  Functional History: Prior Function Comments: unable to determine as no family present and pt is aphasic. Based on documentation it seems the pt was able to drive her brothers car so PT assumes the pt is fairly independent. Functional Status:  Mobility: Bed Mobility Overal bed mobility: Needs Assistance Bed Mobility: Rolling,Sidelying to Sit Rolling: Max assist Sidelying to sit: Max assist General bed mobility comments: pt is able to  pull through LUE to elevate trunk, otherwise requires significant assistance for LE management and use of pad to rotate hips Transfers Overall transfer level: Needs assistance Equipment used: 2 person hand held assist Transfers: Sit to/from [employer] Sit to Stand:  Mod assist,+2 physical assistance Stand pivot transfers: Mod assist,+2 physical assistance General transfer comment: pt requires initiation of sit to stand and tacilte cues/weight shift to initiate step and turn to recliner      ADL: ADL Overall ADL's : Needs assistance/impaired Eating/Feeding: NPO Eating/Feeding Details (indicate cue type and reason): pt noted to be choking on secretions sitting in bed. pt with tearful eyes nose slightly wet and drainage from mouth Grooming: Total assistance Upper Body Bathing: Cueing for sequencing Lower Body Bathing: Cueing for safety Toilet Transfer: +2 for physical assistance,Moderate assistance Toilet Transfer Details (indicate cue type and reason): simulated EOB to chair General ADL Comments: pt progressing with visual cues and automatic responses to functional task. pt does not verbalize to thearpist during session. pt closing L eye with visual tracking  Cognition: Cognition Overall Cognitive Status: Difficult to assess Arousal/Alertness: Awake/alert Orientation Level: Oriented to person Attention: Sustained Sustained Attention: Impaired Sustained Attention Impairment: Functional basic Memory:  (TBA) Awareness: Impaired Awareness Impairment: Emergent impairment Problem Solving:  (will assess further) Safety/Judgment: Impaired Cognition Arousal/Alertness: Awake/alert Behavior During Therapy: Flat affect Overall Cognitive Status: Difficult to assess General Comments: pt with expressive and receptive aphasia, does not follow verbal commands and inconsistently follows visual cues. Pt is able to repeat mobility with tactile cueing and therapist initiation. Difficult to assess due to: Impaired communication  Blood pressure (!) 143/71, pulse 73, temperature 98.4 F (36.9 C), temperature source Axillary, resp. rate 20, height 5\' 2"  (1.575 m), weight 93 kg, last menstrual period 09/28/2012, SpO2 100 %. Physical Exam Gen: no distress, normal  appearing HEENT: oral mucosa pink and moist, NCAT Cardio: Reg rate Chest: normal effort, normal rate of breathing Abd: soft, non-distended Ext: no edema Psych: pleasant, normal affect Skin: intact Neuro: Patient is alert. Globally aphasic. Follows some simple commands. Overall examination limited due to aphasia. Makes eye contact and nods head yes in response to my speech. Unable to follow MMT currently, but appears to be moving all extremities antigravity   Results for orders placed or performed during the hospital encounter of 06/30/20 (from the past 24 hour(s))  CBC     Status: Abnormal   Collection Time: 07/03/20  5:34 AM  Result Value Ref Range   WBC 9.3 4.0 - 10.5 K/uL   RBC 2.90 (L) 3.87 - 5.11 MIL/uL   Hemoglobin 8.5 (L) 12.0 - 15.0 g/dL   HCT 25.2 (L) 36.0 - 46.0 %   MCV 86.9 80.0 - 100.0 fL   MCH 29.3 26.0 - 34.0 pg   MCHC 33.7 30.0 - 36.0 g/dL   RDW 14.1 11.5 - 15.5 %   Platelets 385 150 - 400 K/uL   nRBC 0.0 0.0 - 0.2 %  Basic metabolic panel     Status: Abnormal   Collection Time: 07/03/20  5:34 AM  Result Value Ref Range   Sodium 146 (H) 135 - 145 mmol/L   Potassium 3.8 3.5 - 5.1 mmol/L   Chloride 115 (H) 98 - 111 mmol/L   CO2 20 (L) 22 - 32 mmol/L   Glucose, Bld 107 (H) 70 - 99 mg/dL   BUN 12 8 - 23 mg/dL   Creatinine, Ser 1.26 (H) 0.44 - 1.00 mg/dL   Calcium 8.4 (L) 8.9 -  10.3 mg/dL   GFR, Estimated 47 (L) >60 mL/min   Anion gap 11 5 - 15  Glucose, capillary     Status: Abnormal   Collection Time: 07/03/20  7:42 AM  Result Value Ref Range   Glucose-Capillary 100 (H) 70 - 99 mg/dL  Glucose, capillary     Status: Abnormal   Collection Time: 07/03/20 11:33 AM  Result Value Ref Range   Glucose-Capillary 108 (H) 70 - 99 mg/dL  Glucose, capillary     Status: None   Collection Time: 07/03/20  3:30 PM  Result Value Ref Range   Glucose-Capillary 94 70 - 99 mg/dL  Glucose, capillary     Status: None   Collection Time: 07/03/20  7:42 PM  Result Value Ref Range    Glucose-Capillary 97 70 - 99 mg/dL  Glucose, capillary     Status: None   Collection Time: 07/03/20 11:12 PM  Result Value Ref Range   Glucose-Capillary 91 70 - 99 mg/dL  Glucose, capillary     Status: None   Collection Time: 07/04/20  3:31 AM  Result Value Ref Range   Glucose-Capillary 95 70 - 99 mg/dL   CT ABDOMEN PELVIS WO CONTRAST  Result Date: 07/02/2020 CLINICAL DATA:  Retroperitoneal hematoma.  Decreased hemoglobin. EXAM: CT ABDOMEN AND PELVIS WITHOUT CONTRAST TECHNIQUE: Multidetector CT imaging of the abdomen and pelvis was performed following the standard protocol without IV contrast. COMPARISON:  CT dated 07/14/2019 FINDINGS: Lower chest: There is atelectasis versus aspiration at the lung bases.The heart is enlarged. The intracardiac blood pool is hypodense relative to the adjacent myocardium consistent with anemia. Hepatobiliary: The liver is normal. Status post cholecystectomy.There is no biliary ductal dilation. Pancreas: Normal contours without ductal dilatation. No peripancreatic fluid collection. Spleen: Unremarkable. Adrenals/Urinary Tract: --Adrenal glands: Unremarkable. --Right kidney/ureter: No hydronephrosis or radiopaque kidney stones. --Left kidney/ureter: No hydronephrosis or radiopaque kidney stones. --Urinary bladder: There is a Foley catheter in place. Stomach/Bowel: --Stomach/Duodenum: Patient is status post prior gastric bypass. The enteric tube terminates in the proximal small bowel. --Small bowel: Unremarkable. --Colon: There is a large amount of stool at the level of the rectum. --Appendix: Not visualized. No right lower quadrant inflammation or free fluid. Vascular/Lymphatic: There is fat stranding in the right inguinal region with a small retroperitoneal hematoma on the right. --No retroperitoneal lymphadenopathy. --No mesenteric lymphadenopathy. --No pelvic or inguinal lymphadenopathy. Reproductive: Unremarkable Other: No ascites or free air. There is a small fat  containing umbilical hernia. Musculoskeletal. No acute displaced fractures. IMPRESSION: 1. Fat stranding in the right inguinal region with a small retroperitoneal hematoma on the right. 2. Cardiomegaly. 3. Anemia. 4. Bibasilar atelectasis versus aspiration. 5. Large amount of stool at the level of the rectum. Electronically Signed   By: Constance Holster M.D.   On: 07/02/2020 18:17   CT HEAD WO CONTRAST  Result Date: 07/02/2020 CLINICAL DATA:  Stroke follow-up.  Intracranial hemorrhage. EXAM: CT HEAD WITHOUT CONTRAST TECHNIQUE: Contiguous axial images were obtained from the base of the skull through the vertex without intravenous contrast. COMPARISON:  CT head 06/30/2020, MRI head 07/01/2020 FINDINGS: Brain: Left MCA infarct with hypodensity in the left temporal and parietal lobe as well as in the left basal ganglia involving the caudate and putamen. Small 1 cm hemorrhage left temporal lobe unchanged from prior CT and MRI. Small focus of subarachnoid hemorrhage in the left lateral temporal lobe which was present on the prior CT. Ventricle size normal. No midline shift. No new area of infarction. Vascular: Negative  for hyperdense vessel Skull: Negative Sinuses/Orbits: Paranasal sinuses clear. NG tube in place. Negative orbit. Other: None IMPRESSION: Acute left MCA infarct unchanged from prior studies. Small amount of hemorrhage left temporal lobe also unchanged. No new area of hemorrhage or infarction. Electronically Signed   By: Franchot Gallo M.D.   On: 07/02/2020 17:49   DG CHEST PORT 1 VIEW  Result Date: 07/02/2020 CLINICAL DATA:  Fever, shortness of breath, hypertension. Status post LEFT MCA thrombectomy. EXAM: PORTABLE CHEST 1 VIEW COMPARISON:  Chest x-ray dated 06/30/2020. FINDINGS: Endotracheal tube has been removed. Enteric tube passes below the diaphragm. Heart size and mediastinal contours appear stable. Lungs are clear. No pleural effusion or pneumothorax is seen. IMPRESSION: No active disease.  No evidence of pneumonia or pulmonary edema. Electronically Signed   By: Franki Cabot M.D.   On: 07/02/2020 11:01     Assessment/Plan: Diagnosis: Left MCA occlusion 1. Does the need for close, 24 hr/day medical supervision in concert with the patient's rehab needs make it unreasonable for this patient to be served in a less intensive setting? Yes 2. Co-Morbidities requiring supervision/potential complications:  1. HTN 2. DM2 3. CKD 4. Obesity 5. S/p gastric bypass 12/21 6. Confusion 7. Seizure-like activity 8. Drooling 9. Bowel incontinence 10. Left gaze preference: will require intensive PT, OT, and SLP 11. Right sided weakness: will require intensive PT and OT 12. Global aphasia: will require intensive SLP 13. Dysphagia 3. Due to bladder management, bowel management, safety, skin/wound care, disease management, medication administration, pain management and patient education, does the patient require 24 hr/day rehab nursing? Yes 4. Does the patient require coordinated care of a physician, rehab nurse, therapy disciplines of PT, OT, SLP to address physical and functional deficits in the context of the above medical diagnosis(es)? Yes Addressing deficits in the following areas: balance, endurance, locomotion, strength, transferring, bowel/bladder control, bathing, dressing, feeding, grooming, toileting, cognition, speech, language, swallowing and psychosocial support 5. Can the patient actively participate in an intensive therapy program of at least 3 hrs of therapy per day at least 5 days per week? Yes 6. The potential for patient to make measurable gains while on inpatient rehab is excellent 7. Anticipated functional outcomes upon discharge from inpatient rehab are min assist  with PT, min assist with OT, min assist with SLP. 8. Estimated rehab length of stay to reach the above functional goals is: 20-22 days 9. Anticipated discharge destination: Home 10. Overall Rehab/Functional  Prognosis: good  RECOMMENDATIONS: This patient's condition is appropriate for continued rehabilitative care in the following setting: CIR Patient has agreed to participate in recommended program. Yes Note that insurance prior authorization may be required for reimbursement for recommended care.  Comment: Thank you for this consult. Admission coordinator to follow.   I have personally performed a face to face diagnostic evaluation, including, but not limited to relevant history and physical exam findings, of this patient and developed relevant assessment and plan.  Additionally, I have reviewed and concur with the physician assistant's documentation above.  Leeroy Cha, MD  Lavon Paganini [place], PA-C 07/04/2020

## 2020-07-05 DIAGNOSIS — N1831 Chronic kidney disease, stage 3a: Secondary | ICD-10-CM

## 2020-07-05 DIAGNOSIS — I10 Essential (primary) hypertension: Secondary | ICD-10-CM | POA: Diagnosis not present

## 2020-07-05 DIAGNOSIS — D631 Anemia in chronic kidney disease: Secondary | ICD-10-CM

## 2020-07-05 DIAGNOSIS — I63512 Cerebral infarction due to unspecified occlusion or stenosis of left middle cerebral artery: Secondary | ICD-10-CM | POA: Diagnosis not present

## 2020-07-05 LAB — CBC
HCT: 28.4 % — ABNORMAL LOW (ref 36.0–46.0)
Hemoglobin: 9.2 g/dL — ABNORMAL LOW (ref 12.0–15.0)
MCH: 27.9 pg (ref 26.0–34.0)
MCHC: 32.4 g/dL (ref 30.0–36.0)
MCV: 86.1 fL (ref 80.0–100.0)
Platelets: 487 10*3/uL — ABNORMAL HIGH (ref 150–400)
RBC: 3.3 MIL/uL — ABNORMAL LOW (ref 3.87–5.11)
RDW: 13.4 % (ref 11.5–15.5)
WBC: 7.3 10*3/uL (ref 4.0–10.5)
nRBC: 0 % (ref 0.0–0.2)

## 2020-07-05 LAB — GLUCOSE, CAPILLARY
Glucose-Capillary: 93 mg/dL (ref 70–99)
Glucose-Capillary: 111 mg/dL — ABNORMAL HIGH (ref 70–99)
Glucose-Capillary: 98 mg/dL (ref 70–99)

## 2020-07-05 LAB — BASIC METABOLIC PANEL
Anion gap: 12 (ref 5–15)
BUN: 12 mg/dL (ref 8–23)
CO2: 22 mmol/L (ref 22–32)
Calcium: 8.9 mg/dL (ref 8.9–10.3)
Chloride: 109 mmol/L (ref 98–111)
Creatinine, Ser: 1.2 mg/dL — ABNORMAL HIGH (ref 0.44–1.00)
GFR, Estimated: 50 mL/min — ABNORMAL LOW (ref >60)
Glucose, Bld: 91 mg/dL (ref 70–99)
Potassium: 3.6 mmol/L (ref 3.5–5.1)
Sodium: 143 mmol/L (ref 135–145)

## 2020-07-05 MED ORDER — RESOURCE THICKENUP CLEAR PO POWD
ORAL | Status: DC | PRN
  Filled 2020-07-05: qty 125

## 2020-07-05 MED ORDER — ENSURE ENLIVE PO LIQD
237.0000 mL | Freq: Two times a day (BID) | ORAL | Status: DC
  Administered 2020-07-05 – 2020-07-09 (×4): 237 mL via ORAL

## 2020-07-05 MED ORDER — CALCIUM CITRATE 950 (200 CA) MG PO TABS
200.0000 mg | ORAL_TABLET | Freq: Three times a day (TID) | ORAL | Status: DC
  Administered 2020-07-05 – 2020-07-06 (×3): 200 mg via ORAL
  Filled 2020-07-05 (×5): qty 1

## 2020-07-05 MED ORDER — CHLORHEXIDINE GLUCONATE 0.12 % MT SOLN
OROMUCOSAL | Status: AC
  Filled 2020-07-05: qty 15

## 2020-07-05 MED ORDER — ADULT MULTIVITAMIN W/MINERALS CH
1.0000 | ORAL_TABLET | Freq: Two times a day (BID) | ORAL | Status: DC
  Administered 2020-07-05 – 2020-07-09 (×8): 1
  Filled 2020-07-05 (×8): qty 1

## 2020-07-05 MED ORDER — VITAMIN D 25 MCG (1000 UNIT) PO TABS
1000.0000 [IU] | ORAL_TABLET | Freq: Every day | ORAL | Status: DC
  Administered 2020-07-06: 1000 [IU] via ORAL
  Filled 2020-07-05: qty 1

## 2020-07-05 NOTE — Progress Notes (Signed)
Occupational Therapy Treatment Patient Details Name: Courtney Burke MRN: [id number] DOB: 1956/03/20 Today's Date: 07/05/2020    History of present illness 65 y.o. female with a PMHx of HTN, DM, CKD III, asthma, Vit D deficiency, obesity s/p gastric sleeve 12/21 who [facility] via [facility] as a CODE STROKE with AMS and aphasia. Pt's brother also reports multiple periods of LOC. In ED pt demonstrates L gaze and R hemiparesis. CTA demonstrates L M1 occlusion. Pt underwent L common carotid arteriogram with revascularization of L MCA on 06/30/2020.   OT comments  Pt progressing towards established OT goals and is motivated to participate in therapy. Pt performing sit<>stand with Min A +2 and functional mobility with Min-Mod A +2. Pt washing her face at sink while seated. Requiring Max cues and significant time for sequencing and processing. Pt continue to present with right inattention, poor cognition, and decreased balance impacting her safe performance of ADLs. Continue to recommend dc to CIR and will continue to follow acutely as admitted.    Follow Up Recommendations  CIR    Equipment Recommendations  3 in 1 bedside commode;Wheelchair (measurements OT);Wheelchair cushion (measurements OT);Hospital bed    Recommendations for Other Services Rehab consult    Precautions / Restrictions Precautions Precautions: Fall       Mobility Bed Mobility Overal bed mobility: Needs Assistance Bed Mobility: Rolling;Sidelying to Sit Rolling: Mod assist Sidelying to sit: Mod assist       General bed mobility comments: Max cues to bring RUE to left bedrail and pull intop sidlying. Pt then using RLE to bring LLE over EOB with Mod A. Mod A to push up into upright position  Transfers Overall transfer level: Needs assistance Equipment used: 2 person hand held assist Transfers: Sit to/from Stand Sit to Stand: Min assist;+2 physical assistance         General transfer comment: Min A +2 to power up  into standing with R knee blocked and support provided at [place] Overall balance assessment: Needs assistance Sitting-balance support: Single extremity supported;Bilateral upper extremity supported;Feet unsupported Sitting balance-Leahy Scale: Fair Sitting balance - Comments: Able to maintain sitting balance   Standing balance support: Bilateral upper extremity supported;During functional activity Standing balance-Leahy Scale: Poor                             ADL either performed or assessed with clinical judgement   ADL Overall ADL's : Needs assistance/impaired     Grooming: Wash/dry face;Moderate assistance;Sitting Grooming Details (indicate cue type and reason): Pt particiapting in washing her face while seated at sink. Requiring Max simple cues for initating each step to wash face. Processing slowly.                 Toilet Transfer: Minimal assistance;+2 for physical assistance;Ambulation (simulated to chair) Toilet Transfer Details (indicate cue type and reason): Min A to power up into standing. Supporting RUE for weight bearing         Functional mobility during ADLs: Minimal assistance;Moderate assistance;+2 for safety/equipment;Rolling walker General ADL Comments: Focused on mobility to/from sink and then sequencing ADL at sink. Pt continues to present with decreased balance, strength, inattention, and cognition     Vision   Vision Assessment?: Yes Eye Alignment: Impaired (comment) Ocular Range of Motion: Impaired-to be further tested in functional context Tracking/Visual Pursuits: Impaired - to be further tested in functional context Additional Comments: Inattention to right. tendency for  left gaze   Perception     Praxis      Cognition Arousal/Alertness: Awake/alert Behavior During Therapy: Flat affect Overall Cognitive Status: Difficult to assess Area of Impairment: Following commands;Problem solving                        Following Commands: Follows one step commands inconsistently;Follows one step commands with increased time     Problem Solving: Slow processing;Requires verbal cues;Decreased initiation General Comments: Aphasia impacting her engagement. Pt stating and nodding yes/no to simpel questions. Pt slightly flat however making eye contact with therapist. Requiring signfiicant time for processing.        Exercises     Shoulder Instructions       General Comments      Pertinent Vitals/ Pain       Pain Assessment: Faces Faces Pain Scale: No hurt Pain Location: RUE with painful timuli Pain Descriptors / Indicators: Grimacing Pain Intervention(s): Monitored during session;Limited activity within patient's tolerance;Repositioned  Home Living                                          Prior Functioning/Environment              Frequency  Min 2X/week        Progress Toward Goals  OT Goals(current goals can now be found in the care plan section)  Progress towards OT goals: Progressing toward goals  Acute Rehab OT Goals Patient Stated Goal: unable to state OT Goal Formulation: Patient unable to participate in goal setting Time For Goal Achievement: 07/15/20 Potential to Achieve Goals: Good ADL Goals Pt Will Perform Grooming: with mod assist;sitting Additional ADL Goal #1: pt will follow 1 step command 50% of session Additional ADL Goal #2: pt will complete basic transfer mod (A) as precursor to adls. Additional ADL Goal #3: Pt will complete bed mobilty mod (A)  Plan Discharge plan remains appropriate    Co-evaluation    PT/OT/SLP Co-Evaluation/Treatment: Yes Reason for Co-Treatment: For patient/therapist safety;To address functional/ADL transfers   OT goals addressed during session: ADL's and self-care      AM-PAC OT "6 Clicks" Daily Activity     Outcome Measure   Help from another person eating meals?: Total Help from another person taking care  of personal grooming?: A Lot Help from another person toileting, which includes using toliet, bedpan, or urinal?: A Lot Help from another person bathing (including washing, rinsing, drying)?: Total Help from another person to put on and taking off regular upper body clothing?: A Lot Help from another person to put on and taking off regular lower body clothing?: Total 6 Click Score: 9    End of Session Equipment Utilized During Treatment: Rolling walker;Gait belt  OT Visit Diagnosis: Unsteadiness on feet (R26.81);Muscle weakness (generalized) (M62.81)   Activity Tolerance Patient tolerated treatment well   Patient Left in chair;with call bell/phone within reach;with chair alarm set   Nurse Communication Mobility status;Precautions        Time: 0932-3557 OT Time Calculation (min): 26 min  Charges: OT General Charges $OT Visit: 1 Visit OT Treatments $Self Care/Home Management : 8-22 mins  [place], OTR/L Acute Rehab Pager: [phone] Office: [place] 07/05/2020, 2:18 PM

## 2020-07-05 NOTE — Progress Notes (Signed)
  Speech Language Pathology Treatment: Dysphagia;Cognitive-Linquistic  Patient Details Name: Courtney Burke MRN: [id number] DOB: 02/25/1956 Today's Date: 07/05/2020 Time: 9381-0175 SLP Time Calculation (min) (ACUTE ONLY): 15 min  Assessment / Plan / Recommendation Clinical Impression  Pt seen for dysphagia and aphasia/apraxia tx. Trial with straw with thin with immediate cough and continued throat clears. Throat clearing persisted with magic cup but suspect from water. She self fed cup sips water with left hand and continued to cough immediately after. Therapist downgraded liquids to nectar and will continue trials with thin liquid.  Decreased comprehension of simple yes/no ("are you a man, are you standing up") in which she was 30% accurate. She imitated vocalizations but most of session was focused on dysphagia. Will continue ST to maximize language and swallow.    HPI HPI: Courtney Burke is a 65 y.o. 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 CT probable few small areas of acute left MCA infarction, less apparent. Ill-defined patchy hyperdensity may reflect contrast. Found to have M1 occlusion and underwent thrombectomy and intubated 1/27-1/28 am. MRI pending.      SLP Plan  Continue with current plan of care       Recommendations  Diet recommendations: Dysphagia 2 (fine chop);Nectar-thick liquid Liquids provided via: Cup Medication Administration: Crushed with puree Supervision: Patient able to self feed;Staff to assist with self feeding;Full supervision/cueing for compensatory strategies Compensations: Slow rate;Small sips/bites;Lingual sweep for clearance of pocketing;Other (Comment) Postural Changes and/or Swallow Maneuvers: Seated upright 90 degrees                Oral Care Recommendations: Oral care BID Follow up Recommendations: Inpatient  Rehab SLP Visit Diagnosis: Dysphagia, oropharyngeal phase (R13.12);Aphasia (R47.01) Plan: Continue with current plan of care       GO                Courtney Burke 07/05/2020, 4:43 PM Courtney Burke Courtney Burke.Ed Risk analyst [phone] Office [phone]

## 2020-07-05 NOTE — Progress Notes (Signed)
Inpatient Rehab Admissions Coordinator:   I have opened a case with pt.'s insurance but do not have authorization or a bed for Pt. Today. Will continue to follow for potential admission later this week pending insurance auth or bed availability.   Clemens Catholic, [place], [place] Admissions Coordinator  [phone] ([place]) [phone] (office)

## 2020-07-05 NOTE — Progress Notes (Signed)
PROGRESS NOTE  Courtney Burke LXB:[id number] DOB: 07-03-1955   PCP: [name], PA-C  Patient is from: Home  DOA: 06/30/2020 LOS: 5  Chief complaints: Altered mental status  Brief Narrative / Interim history: 65 year old female with PMH of HTN, DM-2, CKD-3A, obesity s/p gastric sleeve in 05/2020 brought to ED by EMS as code stroke in the setting of altered mental status.  She was also seen with eye rolling back in her head and brief LOC x2.  Found to have right-sided weakness on presentation.  CVA work-up revealed left MCA CVA likely due to M1 segment occlusion.  She underwent thrombectomy and stent placement by interventional radiologist on 06/30/2020.  Therapy recommended CIR.  SLP upgraded her to dysphagia 2 diet.  Hospital course complicated by ABLA  Subjective: Seen and examined earlier this morning.  No major events overnight or this morning.  Patient is nonverbal.  Follows commands.  She notes yes to pain but wasn't able to localize.  She notes yes when I pointed out a chest, abdomen, head and her arm.  Objective: Vitals:   07/04/20 2330 07/05/20 0405 07/05/20 0800 07/05/20 1100  BP: (!) 170/72 (!) 176/77 (!) 160/75 (!) 148/70  Pulse: 66 66 71 71  Resp: 18 18 (!) 25 (!) 21  Temp: 98.1 F (36.7 C) (!) 97.5 F (36.4 C) 97.9 F (36.6 C) 98.3 F (36.8 C)  TempSrc: Oral Oral Oral Oral  SpO2: 99% 100% 100% 100%  Weight:      Height:        Intake/Output Summary (Last 24 hours) at 07/05/2020 1403 Last data filed at 07/05/2020 1000 Gross per 24 hour  Intake -  Output 250 ml  Net -250 ml   Filed Weights   07/03/20 0500 07/04/20 0500 07/04/20 2153  Weight: 93.3 kg 93 kg 93.1 kg    Examination:  GENERAL: No apparent distress.  Nontoxic. HEENT: MMM.  Vision and hearing grossly intact.  NECK: Supple.  No apparent JVD.  RESP: On RA.  No IWOB.  Fair aeration bilaterally. CVS:  RRR. Heart sounds normal.  ABD/GI/GU: BS+. Abd soft, NTND.  MSK/EXT:  Moves  extremities. No apparent deformity. No edema.  SKIN: no apparent skin lesion or wound NEURO: Awake and alert.  Not able to assess orientation.  Residual right-sided hemiparesis but improved. PSYCH: Calm. Normal affect.  Procedures:  06/30/2020-left M1 thrombectomy and stent placement.  Microbiology summarized: COVID-19 PCR nonreactive.  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 -s/p left M1 thrombectomy with stent placement on 1/27 -Continue DAPT with aspirin and Brilinta -Continue statin -Continue Keppra for seizure like activity prior to admission -Neurology recommended loop recorder prior to discharge-Will notify cardiology -Therapy recommended CIR -SLP upgraded her to dysphagia 2 diet  Dysphagia likely due to CVA -NG tube in place -Upgraded to dysphagia 2 diet by SLP   Essential hypertension: BP elevated. -DC normal saline  History of DM-2?: A1c 5.4%.  Does not appear to be on diabetic medication. Recent Labs  Lab 07/04/20 1116 07/04/20 1518 07/04/20 1949 07/05/20 0808 07/05/20 1219  GLUCAP 96 117* 85 93 111*  -Stop SSI and CBG monitoring -Continue Lipitor.  ABLA in the setting of small right-sided retroperitoneal hematoma: H&H relatively stable. Recent Labs    07/08/19 1202 06/30/20 0710 06/30/20 0719 06/30/20 1405 07/01/20 0513 07/02/20 0715 07/02/20 1341 07/03/20 0534 07/04/20 0707 07/05/20 0252  HGB 12.7 11.5* 11.2* 9.9* 9.1* 7.9* 8.2*  8.5* 8.2* 9.2*  -Continue monitoring -Anemia panel in the morning  CKD-3A: Stable. Recent Labs    07/08/19 1202 06/30/20 0710 06/30/20 0719 07/01/20 0513 07/02/20 0715 07/03/20 0534 07/04/20 0707 07/05/20 0252  BUN 16 14 17 13 11 12 11 12   CREATININE 1.59* 1.41* 1.30* 1.44* 1.47* 1.26* 1.16* 1.20*     Morbid obesity: S/p gastric sleeve 05/2020 Body mass index is 37.54 kg/m. Nutrition Problem:  Inadequate oral intake Etiology: inability to eat Signs/Symptoms: NPO status Interventions: Tube feeding,Prostat,MVI   DVT prophylaxis:  enoxaparin (LOVENOX) injection 40 mg Start: 07/01/20 2200 SCD's Start: 06/30/20 2671  Code Status: Full code Family Communication: Patient and/or RN. Available if any question.  Level of care: Telemetry Medical Status is: Inpatient  Remains inpatient appropriate because:Hemodynamically unstable   Dispo: The patient is from: Home              Anticipated d/c is to: CIR              Anticipated d/c date is: 1 day              Patient currently is medically stable to d/c.   Difficult to place patient No       Consultants:  Neurology-signed off Interventional radiology-signed off General surgery-signed off   Sch Meds:  Scheduled Meds: . aspirin  81 mg Oral Daily   Or  . aspirin  81 mg Per Tube Daily  . atorvastatin  80 mg Per Tube QHS  . bethanechol  25 mg Per Tube TID  . calcium citrate  200 mg of elemental calcium Oral TID  . chlorhexidine      . chlorhexidine gluconate (MEDLINE KIT)  15 mL Mouth Rinse BID  . Chlorhexidine Gluconate Cloth  6 each Topical Daily  . [START ON 07/06/2020] cholecalciferol  1,000 Units Oral QAC breakfast  . docusate  100 mg Per Tube BID  . enoxaparin (LOVENOX) injection  40 mg Subcutaneous Q24H  . feeding supplement (PROSource TF)  45 mL Per Tube BID  . free water  200 mL Per Tube Q4H  . insulin aspart  0-15 Units Subcutaneous TID WC  . levETIRAcetam  500 mg Per Tube BID  . multivitamin with minerals  1 tablet Per Tube BID  . pantoprazole sodium  40 mg Per Tube Daily  . polyethylene glycol  17 g Per Tube Daily  . senna-docusate  1 tablet Per Tube QHS  . sodium chloride flush  3 mL Intravenous Once  . ticagrelor  90 mg Oral BID   Or  . ticagrelor  90 mg Per Tube BID   Continuous Infusions: . feeding supplement (OSMOLITE 1.2 CAL) Stopped (07/03/20 0130)   PRN Meds:.acetaminophen **OR**  acetaminophen (TYLENOL) oral liquid 160 mg/5 mL **OR** acetaminophen, albuterol, iohexol, labetalol, ondansetron (ZOFRAN) IV  Antimicrobials: Anti-infectives (From admission, onward)   Start     Dose/Rate Route Frequency Ordered Stop   06/30/20 0801  ceFAZolin (ANCEF) 2-4 GM/100ML-% IVPB       Note to Pharmacy: Fredric Dine   : cabinet override      06/30/20 0801 06/30/20 2014       I have personally reviewed the following labs and images: CBC: Recent Labs  Lab 06/30/20 0710 06/30/20 0719 07/01/20 0513 07/02/20 0715 07/02/20 1341 07/03/20 0534 07/04/20 0707 07/05/20 0252  WBC 6.1  --  11.1* 9.5 10.3 9.3 7.0 7.3  NEUTROABS 3.6  --  9.5*  --   --   --   --   --  HGB 11.5*   < > 9.1* 7.9* 8.2* 8.5* 8.2* 9.2*  HCT 36.3   < > 27.1* 24.2* 25.2* 25.2* 25.0* 28.4*  MCV 88.5  --  84.4 85.8 86.3 86.9 86.2 86.1  PLT 507*  --  470* 387 369 385 402* 487*   < > = values in this interval not displayed.   BMP &GFR Recent Labs  Lab 07/01/20 0513 07/02/20 0715 07/03/20 0534 07/04/20 0707 07/05/20 0252  NA 141 143 146* 145 143  K 3.2* 3.7 3.8 3.8 3.6  CL 113* 115* 115* 114* 109  CO2 17* 21* 20* 22 22  GLUCOSE 138* 135* 107* 92 91  BUN 13 11 12 11 12   CREATININE 1.44* 1.47* 1.26* 1.16* 1.20*  CALCIUM 8.0* 8.2* 8.4* 8.8* 8.9   Estimated Creatinine Clearance: 49.7 mL/min (A) (by C-G formula based on SCr of 1.2 mg/dL (H)). Liver & Pancreas: Recent Labs  Lab 06/30/20 0710 07/01/20 0513  AST 21 17  ALT 15 11  ALKPHOS 101 82  BILITOT 0.5 0.6  PROT 6.4* 5.3*  ALBUMIN 3.4* 2.8*   No results for input(s): LIPASE, AMYLASE in the last 168 hours. No results for input(s): AMMONIA in the last 168 hours. Diabetic: No results for input(s): HGBA1C in the last 72 hours. Recent Labs  Lab 07/04/20 1116 07/04/20 1518 07/04/20 1949 07/05/20 0808 07/05/20 1219  GLUCAP 96 117* 85 93 111*   Cardiac Enzymes: No results for input(s): CKTOTAL, CKMB, CKMBINDEX, TROPONINI in the last  168 hours. No results for input(s): PROBNP in the last 8760 hours. Coagulation Profile: Recent Labs  Lab 06/30/20 0710  INR 1.1   Thyroid Function Tests: No results for input(s): TSH, T4TOTAL, FREET4, T3FREE, THYROIDAB in the last 72 hours. Lipid Profile: No results for input(s): CHOL, HDL, LDLCALC, TRIG, CHOLHDL, LDLDIRECT in the last 72 hours. Anemia Panel: No results for input(s): VITAMINB12, FOLATE, FERRITIN, TIBC, IRON, RETICCTPCT in the last 72 hours. Urine analysis:    Component Value Date/Time   COLORURINE YELLOW 06/30/2020 0856   APPEARANCEUR CLEAR 06/30/2020 0856   LABSPEC 1.017 06/30/2020 0856   PHURINE 5.0 06/30/2020 0856   GLUCOSEU NEGATIVE 06/30/2020 0856   HGBUR MODERATE (A) 06/30/2020 0856   BILIRUBINUR NEGATIVE 06/30/2020 0856   KETONESUR NEGATIVE 06/30/2020 0856   PROTEINUR NEGATIVE 06/30/2020 0856   NITRITE NEGATIVE 06/30/2020 0856   LEUKOCYTESUR TRACE (A) 06/30/2020 0856   Sepsis Labs: Invalid input(s): PROCALCITONIN, [place]  Microbiology: Recent Results (from the past 240 hour(s))  SARS Coronavirus 2 by RT PCR (hospital order, performed in [facility] hospital lab) Nasopharyngeal Nasopharyngeal Swab     Status: None   Collection Time: 06/30/20  7:36 AM   Specimen: Nasopharyngeal Swab  Result Value Ref Range Status   SARS Coronavirus 2 NEGATIVE NEGATIVE Final    Comment: (NOTE) SARS-CoV-2 target nucleic acids are NOT DETECTED.  The SARS-CoV-2 RNA is generally detectable in upper and lower respiratory specimens during the acute phase of infection. The lowest concentration of SARS-CoV-2 viral copies this assay can detect is 250 copies / mL. A negative result does not preclude SARS-CoV-2 infection and should not be used as the sole basis for treatment or other patient management decisions.  A negative result may occur with improper specimen collection / handling, submission of specimen other than nasopharyngeal swab, presence of viral mutation(s)  within the areas targeted by this assay, and inadequate number of viral copies (<250 copies / mL). A negative result must be combined with clinical observations, patient  history, and epidemiological information.  Fact Sheet for Patients:   StrictlyIdeas.no  Fact Sheet for Healthcare Providers: BankingDealers.co.za  This test is not yet approved or  cleared by the [place] FDA and has been authorized for detection and/or diagnosis of SARS-CoV-2 by FDA under an Emergency Use Authorization (EUA).  This EUA will remain in effect (meaning this test can be used) for the duration of the COVID-19 declaration under Section 564(b)(1) of the Act, 21 U.S.C. section 360bbb-3(b)(1), unless the authorization is terminated or revoked sooner.  Performed at [facility], [place] [address] [place], [place] [address]   MRSA PCR Screening     Status: None   Collection Time: 06/30/20  1:35 PM   Specimen: Nasal Mucosa; Nasopharyngeal  Result Value Ref Range Status   MRSA by PCR NEGATIVE NEGATIVE Final    Comment:        The GeneXpert MRSA Assay (FDA approved for NASAL specimens only), is one component of a comprehensive MRSA colonization surveillance program. It is not intended to diagnose MRSA infection nor to guide or monitor treatment for MRSA infections. Performed at [facility], [place] [address], [place], [place] [address]     Radiology Studies: DG Swallowing Func-Speech Pathology  Result Date: 07/04/2020 Objective Swallowing Evaluation: Type of Study: MBS-Modified Barium Swallow Study  Patient Details Name: Cashae Weich MRN: [id number] Date of Birth: 10-06-1955 Today's Date: 07/04/2020 Time: SLP Start Time (ACUTE ONLY): 1419 -SLP Stop Time (ACUTE ONLY): 1434 SLP Time Calculation (min) (ACUTE ONLY): 15 min Past Medical History: Past Medical History: Diagnosis Date . Asthma  . Borderline diabetes  . Hypertension  Past Surgical  History: Past Surgical History: Procedure Laterality Date . ANKLE SURGERY   . CARPAL TUNNEL RELEASE   . carpel tunnel   . IR CT HEAD LTD  06/30/2020 . IR INTRA CRAN STENT  06/30/2020 . IR PERCUTANEOUS ART THROMBECTOMY/INFUSION INTRACRANIAL INC DIAG ANGIO  06/30/2020 . RADIOLOGY WITH ANESTHESIA N/A 06/30/2020  Procedure: RADIOLOGY WITH ANESTHESIA;  Surgeon: Radiologist, Medication, MD;  Location: [facility];  Service: Radiology;  Laterality: N/A; HPI: Jessa Stinson is a 65 y.o. 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 CT probable few small areas of acute left MCA infarction, less apparent. Ill-defined patchy hyperdensity may reflect contrast. Found to have M1 occlusion and underwent thrombectomy and intubated 1/27-1/28 am. MRI pending.  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 She cleared oral cavity with mechanical soft textures. Consistently lingual residue fell to pyriform sinuses after swallow with spontaneous subswallow to clear. Her intact strength and ROM allowed her to achieve complete laryngeal protection. However, mistimed initiation of protection in addition to larger sip with straw, led to trace aspiration stopping just below her vocal cords. A weak reflexive throat clear audible after 10 second delay. Pt's comprehension and execution of postures may not be consistent and able to rely on for safe consumption. Recommend initiate Dys 2 (fine chopped), thin liquids, NO straws, full supervision/assist, check for oral pocketing and pills whole in puree. Esophageal scan was unremarkable. SLP Visit Diagnosis Dysphagia, oropharyngeal phase (R13.12) Attention and  concentration deficit following -- Frontal lobe and executive function deficit following --  Impact on safety and function Mild aspiration risk;Moderate aspiration risk   CHL IP TREATMENT RECOMMENDATION 07/04/2020 Treatment Recommendations Therapy as outlined in treatment plan below   Prognosis 07/04/2020 Prognosis for Safe Diet Advancement Good Barriers to Reach Goals -- Barriers/Prognosis Comment -- CHL IP DIET RECOMMENDATION 07/04/2020 SLP Diet Recommendations Dysphagia 2 (Fine chop) solids;Thin liquid Liquid Administration via No straw;Cup Medication Administration Whole meds with puree Compensations Slow rate;Small sips/bites;Lingual sweep for clearance of pocketing;Other (Comment) Postural Changes Seated upright at 90 degrees   CHL IP OTHER RECOMMENDATIONS 07/04/2020 Recommended Consults -- Oral Care Recommendations Oral care BID Other Recommendations --   CHL IP FOLLOW UP RECOMMENDATIONS 07/04/2020 Follow up Recommendations Inpatient Rehab   CHL IP FREQUENCY AND DURATION 07/04/2020 Speech Therapy Frequency (ACUTE ONLY) min 2x/week Treatment Duration 2 weeks      CHL IP ORAL PHASE 07/04/2020 Oral Phase Impaired Oral - Pudding Teaspoon -- Oral - Pudding Cup -- Oral - Honey Teaspoon -- Oral - Honey Cup -- Oral - Nectar Teaspoon -- Oral - Nectar Cup Delayed oral transit;Piecemeal swallowing Oral - Nectar Straw Delayed oral transit;Piecemeal swallowing Oral - Thin Teaspoon -- Oral - Thin Cup Right anterior bolus loss Oral - Thin Straw Delayed oral transit Oral - Puree Delayed oral transit;Other (Comment) Oral - Mech Soft Delayed oral transit Oral - Regular -- Oral - Multi-Consistency -- Oral - Pill -- Oral Phase - Comment --  CHL IP PHARYNGEAL PHASE 07/04/2020 Pharyngeal Phase Impaired Pharyngeal- Pudding Teaspoon -- Pharyngeal -- Pharyngeal- Pudding Cup -- Pharyngeal -- Pharyngeal- Honey Teaspoon -- Pharyngeal -- Pharyngeal- Honey Cup -- Pharyngeal -- Pharyngeal- Nectar Teaspoon -- Pharyngeal -- Pharyngeal- Nectar Cup WFL  Pharyngeal -- Pharyngeal- Nectar Straw WFL Pharyngeal Other (Comment) Pharyngeal- Thin Teaspoon -- Pharyngeal -- Pharyngeal- Thin Cup WFL Pharyngeal -- Pharyngeal- Thin Straw Penetration/Aspiration during swallow Pharyngeal Other (Comment) Pharyngeal- Puree WFL Pharyngeal -- Pharyngeal- Mechanical Soft WFL Pharyngeal -- Pharyngeal- Regular -- Pharyngeal -- Pharyngeal- Multi-consistency -- Pharyngeal -- Pharyngeal- Pill -- Pharyngeal -- Pharyngeal Comment --  CHL IP CERVICAL ESOPHAGEAL PHASE 07/04/2020 Cervical Esophageal Phase WFL Pudding Teaspoon -- Pudding Cup -- Honey Teaspoon -- Honey Cup -- Nectar Teaspoon -- Nectar Cup -- Nectar Straw -- Thin Teaspoon -- Thin Cup -- Thin Straw -- Puree -- Mechanical Soft -- Regular -- Multi-consistency -- Pill -- Cervical Esophageal Comment -- [place] Siren 07/04/2020, 3:24 PM Orbie Pyo Litaker M.Ed Actor Pager [phone] Office [phone]               Taye T. [place]  If 7PM-7AM, please contact night-coverage [email or web address] 07/05/2020, 2:03 PM

## 2020-07-05 NOTE — Progress Notes (Signed)
STROKE TEAM PROGRESS NOTE   SUBJECTIVE (INTERVAL HISTORY) No acute events in past 24 hours DHT was replaced by [facility] team. Patient remains aphasic with gutteral sounds only today. Mild right hemiparesis persists.  Hematocrit is stable at 28.4.  BMP is unremarkable. OBJECTIVE Temp:  [97.5 F (36.4 C)-98.5 F (36.9 C)] 98.3 F (36.8 C) (02/01 1100) Pulse Rate:  [40-80] 71 (02/01 1100) Cardiac Rhythm: Normal sinus rhythm (02/01 0705) Resp:  [11-25] 21 (02/01 1100) BP: (146-176)/(70-87) 148/70 (02/01 1100) SpO2:  [99 %-100 %] 100 % (02/01 0800) Weight:  [93.1 kg] 93.1 kg (01/31 2153)  Recent Labs  Lab 07/04/20 1116 07/04/20 1518 07/04/20 1949 07/05/20 0808 07/05/20 1219  GLUCAP 96 117* 85 93 111*   Recent Labs  Lab 07/01/20 0513 07/02/20 0715 07/03/20 0534 07/04/20 0707 07/05/20 0252  NA 141 143 146* 145 143  K 3.2* 3.7 3.8 3.8 3.6  CL 113* 115* 115* 114* 109  CO2 17* 21* 20* 22 22  GLUCOSE 138* 135* 107* 92 91  BUN 13 11 12 11 12   CREATININE 1.44* 1.47* 1.26* 1.16* 1.20*  CALCIUM 8.0* 8.2* 8.4* 8.8* 8.9   Recent Labs  Lab 06/30/20 0710 07/01/20 0513  AST 21 17  ALT 15 11  ALKPHOS 101 82  BILITOT 0.5 0.6  PROT 6.4* 5.3*  ALBUMIN 3.4* 2.8*   Recent Labs  Lab 06/30/20 0710 06/30/20 0719 07/01/20 0513 07/02/20 0715 07/02/20 1341 07/03/20 0534 07/04/20 0707 07/05/20 0252  WBC 6.1  --  11.1* 9.5 10.3 9.3 7.0 7.3  NEUTROABS 3.6  --  9.5*  --   --   --   --   --   HGB 11.5*   < > 9.1* 7.9* 8.2* 8.5* 8.2* 9.2*  HCT 36.3   < > 27.1* 24.2* 25.2* 25.2* 25.0* 28.4*  MCV 88.5  --  84.4 85.8 86.3 86.9 86.2 86.1  PLT 507*  --  470* 387 369 385 402* 487*   < > = values in this interval not displayed.   No results for input(s): CKTOTAL, CKMB, CKMBINDEX, TROPONINI in the last 168 hours. No results for input(s): LABPROT, INR in the last 72 hours. No results for input(s): COLORURINE, LABSPEC, [place], GLUCOSEU, HGBUR, BILIRUBINUR, KETONESUR, PROTEINUR,  UROBILINOGEN, NITRITE, LEUKOCYTESUR in the last 72 hours.  Invalid input(s): APPERANCEUR     Component Value Date/Time   CHOL 143 07/01/2020 0513   TRIG 172 (H) 07/01/2020 0513   TRIG 164 (H) 07/01/2020 0513   HDL 41 07/01/2020 0513   CHOLHDL 3.5 07/01/2020 0513   VLDL 34 07/01/2020 0513   LDLCALC 68 07/01/2020 0513   Lab Results  Component Value Date   HGBA1C 5.4 07/01/2020      Component Value Date/Time   LABOPIA NONE DETECTED 06/30/2020 0825   COCAINSCRNUR NONE DETECTED 06/30/2020 0825   LABBENZ NONE DETECTED 06/30/2020 0825   AMPHETMU NONE DETECTED 06/30/2020 0825   THCU NONE DETECTED 06/30/2020 0825   LABBARB NONE DETECTED 06/30/2020 0825    No results for input(s): ETH in the last 168 hours.  I have personally reviewed the radiological images below and agree with the radiology interpretations.  CT ABDOMEN PELVIS WO CONTRAST  Result Date: 07/02/2020 CLINICAL DATA:  Retroperitoneal hematoma.  Decreased hemoglobin. EXAM: CT ABDOMEN AND PELVIS WITHOUT CONTRAST TECHNIQUE: Multidetector CT imaging of the abdomen and pelvis was performed following the standard protocol without IV contrast. COMPARISON:  CT dated 07/14/2019 FINDINGS: Lower chest: There is atelectasis versus aspiration at the lung bases.The  heart is enlarged. The intracardiac blood pool is hypodense relative to the adjacent myocardium consistent with anemia. Hepatobiliary: The liver is normal. Status post cholecystectomy.There is no biliary ductal dilation. Pancreas: Normal contours without ductal dilatation. No peripancreatic fluid collection. Spleen: Unremarkable. Adrenals/Urinary Tract: --Adrenal glands: Unremarkable. --Right kidney/ureter: No hydronephrosis or radiopaque kidney stones. --Left kidney/ureter: No hydronephrosis or radiopaque kidney stones. --Urinary bladder: There is a Foley catheter in place. Stomach/Bowel: --Stomach/Duodenum: Patient is status post prior gastric bypass. The enteric tube terminates in  the proximal small bowel. --Small bowel: Unremarkable. --Colon: There is a large amount of stool at the level of the rectum. --Appendix: Not visualized. No right lower quadrant inflammation or free fluid. Vascular/Lymphatic: There is fat stranding in the right inguinal region with a small retroperitoneal hematoma on the right. --No retroperitoneal lymphadenopathy. --No mesenteric lymphadenopathy. --No pelvic or inguinal lymphadenopathy. Reproductive: Unremarkable Other: No ascites or free air. There is a small fat containing umbilical hernia. Musculoskeletal. No acute displaced fractures. IMPRESSION: 1. Fat stranding in the right inguinal region with a small retroperitoneal hematoma on the right. 2. Cardiomegaly. 3. Anemia. 4. Bibasilar atelectasis versus aspiration. 5. Large amount of stool at the level of the rectum. Electronically Signed   By: Constance Holster M.D.   On: 07/02/2020 18:17   CT Code Stroke CTA Head W/WO contrast  Result Date: 06/30/2020 CLINICAL DATA:  65 year old female code stroke presentation with left MCA ASPECTS 6. Left gaze deviation. EXAM: CT ANGIOGRAPHY HEAD AND NECK CT PERFUSION BRAIN TECHNIQUE: Multidetector CT imaging of the head and neck was performed using the standard protocol during bolus administration of intravenous contrast. Multiplanar CT image reconstructions and MIPs were obtained to evaluate the vascular anatomy. Carotid stenosis measurements (when applicable) are obtained utilizing NASCET criteria, using the distal internal carotid diameter as the denominator. Multiphase CT imaging of the brain was performed following IV bolus contrast injection. Subsequent parametric perfusion maps were calculated using RAPID software. CONTRAST:  136mL OMNIPAQUE IOHEXOL 350 MG/ML SOLN COMPARISON:  Plain head CT today 0721 hours. [facility] brain MRI 02/15/2016 FINDINGS: CT Brain Perfusion Findings: ASPECTS: 6 CBF (<30%) Volume: 69mL (erroneous).  Using CBF less than 38% 11 mL of parenchyma is detected which does partially correspond to the area of cytotoxic edema by plain CT. Perfusion (Tmax>6.0s) volume: 29mL, hypoperfusion index 0.1 but also might be spurrious. Mismatch Volume: Calculated is not accurate due to erroneously low infarct core, estimated penumbra given the above is 60 to 67 mL. Infarction Location:Left MCA The above was discussed by telephone with Dr. Lesleigh Noe on 06/30/2020 at 07:49 . CTA NECK Skeleton: No acute osseous abnormality identified. Dystrophic/degenerative calcifications of the longus coli muscle insertion at C1-C2. Upper chest: Negative. Other neck: No acute finding. Incidental contrast reflux into a left posterior neck vein. Aortic arch: Slightly bovine arch configuration. No arch atherosclerosis. Right carotid system: Mildly tortuous proximal right CCA. Moderate calcified plaque at the right ICA origin with less than 50 % stenosis with respect to the distal vessel. Mild tortuosity. Left carotid system: Patent, mildly tortuous left CCA. Mild to moderate calcified plaque at the posterior left ICA origin with less than 50 % stenosis with respect to the distal vessel. Mildly tortuous. Vertebral arteries: Negative proximal right subclavian artery and right vertebral artery origin. The right vertebral is mildly tortuous but patent to the skull base without stenosis. Moderate soft more than calcified plaque in the proximal left subclavian artery although no significant stenosis. Left vertebral  artery origin remains normal. Codominant left vertebral artery is patent to the skull base without stenosis. CTA HEAD Posterior circulation: Mild right V4 calcified plaque. Patent distal vertebral arteries to the basilar with mild stenosis at the vertebrobasilar junction. Diminutive PICA. Left AICA appears dominant. Patent basilar artery is diminutive but without stenosis. Patent SCA origins with fetal type bilateral PCA origins. Patent  basilar tip. Bilateral PCA branches are within normal limits. Anterior circulation: Both ICA siphons are patent. No significant siphon plaque or stenosis. Normal posterior communicating artery origins. Patent carotid termini although the left MCA origin is occluded (series 10, image 19). There is some left MCA branch reconstitution as seen on series 13, image 43. Left ACA origin is normal. There is severe stenosis at the right ACA origin. Anterior communicating artery is diminutive. Bilateral A2 branches appear symmetric and within normal limits. Right MCA origin is patent but also irregular with mild stenosis. Right MCA M1 and right MCA bifurcation are patent, although with moderate stenosis at the posterior right MCA M2 origin (series 12, image 10). No right MCA branch occlusion identified. Venous sinuses: Early contrast timing, grossly patent. Anatomic variants: Fetal type PCA origins. Review of the MIP images confirms the above findings IMPRESSION: 1. Positive for emergent large vessel occlusion: Left MCA origin. Some left MCA branch reconstitution. 2. CT Perfusion underestimates infarct core (ASPECTS 6), which is estimated at 11-22 mL, subsequent estimated penumbra of 60 to 67 mL. 3. No other large vessel occlusion. But intracranial atherosclerosis with: - Severe stenosis Right ACA origin. - moderate stenosis Right MCA M2 branch origin. - mild stenosis Right MCA M1, also Vertebrobasilar junction. 4. Cervical carotid atherosclerosis without stenosis. #1 discussed by telephone with Dr. Lesleigh Noe on 06/30/2020 at 0732 hours. And Salient CTP findings discussed at 0749 hours. Electronically Signed   By: Genevie Ann M.D.   On: 06/30/2020 07:58   CT HEAD WO CONTRAST  Result Date: 07/02/2020 CLINICAL DATA:  Stroke follow-up.  Intracranial hemorrhage. EXAM: CT HEAD WITHOUT CONTRAST TECHNIQUE: Contiguous axial images were obtained from the base of the skull through the vertex without intravenous contrast. COMPARISON:   CT head 06/30/2020, MRI head 07/01/2020 FINDINGS: Brain: Left MCA infarct with hypodensity in the left temporal and parietal lobe as well as in the left basal ganglia involving the caudate and putamen. Small 1 cm hemorrhage left temporal lobe unchanged from prior CT and MRI. Small focus of subarachnoid hemorrhage in the left lateral temporal lobe which was present on the prior CT. Ventricle size normal. No midline shift. No new area of infarction. Vascular: Negative for hyperdense vessel Skull: Negative Sinuses/Orbits: Paranasal sinuses clear. NG tube in place. Negative orbit. Other: None IMPRESSION: Acute left MCA infarct unchanged from prior studies. Small amount of hemorrhage left temporal lobe also unchanged. No new area of hemorrhage or infarction. Electronically Signed   By: Franchot Gallo M.D.   On: 07/02/2020 17:49   CT HEAD WO CONTRAST  Result Date: 06/30/2020 CLINICAL DATA:  Stroke, left MCA occlusion post thrombectomy and stenting EXAM: CT HEAD WITHOUT CONTRAST TECHNIQUE: Contiguous axial images were obtained from the base of the skull through the vertex without intravenous contrast. COMPARISON:  Earlier same day FINDINGS: Brain: Probable few patchy areas of loss of gray-white differentiation are identified in the left MCA territory. This is less apparent than on the prior study. There is some ill-defined patchy hyperdensity, most discrete in the superior left temporal lobe on axial series 3, image 14. There is no significant mass  effect. No hydrocephalus. Small chronic right cerebellar infarct. Vascular: New stent of the left M1 MCA. Skull: Calvarium is unremarkable. Sinuses/Orbits: No acute finding. Other: None. IMPRESSION: Probable few small areas of acute left MCA infarction as before but less apparent. Ill-defined patchy hyperdensity may reflect contrast staining and/or reperfusion petechial hemorrhage. No significant mass effect. Electronically Signed   By: Macy Mis M.D.   On: 06/30/2020  16:41   CT Code Stroke CTA Neck W/WO contrast  Result Date: 06/30/2020 CLINICAL DATA:  65 year old female code stroke presentation with left MCA ASPECTS 6. Left gaze deviation. EXAM: CT ANGIOGRAPHY HEAD AND NECK CT PERFUSION BRAIN TECHNIQUE: Multidetector CT imaging of the head and neck was performed using the standard protocol during bolus administration of intravenous contrast. Multiplanar CT image reconstructions and MIPs were obtained to evaluate the vascular anatomy. Carotid stenosis measurements (when applicable) are obtained utilizing NASCET criteria, using the distal internal carotid diameter as the denominator. Multiphase CT imaging of the brain was performed following IV bolus contrast injection. Subsequent parametric perfusion maps were calculated using RAPID software. CONTRAST:  120mL OMNIPAQUE IOHEXOL 350 MG/ML SOLN COMPARISON:  Plain head CT today 0721 hours. [facility] brain MRI 02/15/2016 FINDINGS: CT Brain Perfusion Findings: ASPECTS: 6 CBF (<30%) Volume: 7mL (erroneous). Using CBF less than 38% 11 mL of parenchyma is detected which does partially correspond to the area of cytotoxic edema by plain CT. Perfusion (Tmax>6.0s) volume: 71mL, hypoperfusion index 0.1 but also might be spurrious. Mismatch Volume: Calculated is not accurate due to erroneously low infarct core, estimated penumbra given the above is 60 to 67 mL. Infarction Location:Left MCA The above was discussed by telephone with Dr. Lesleigh Noe on 06/30/2020 at 07:49 . CTA NECK Skeleton: No acute osseous abnormality identified. Dystrophic/degenerative calcifications of the longus coli muscle insertion at C1-C2. Upper chest: Negative. Other neck: No acute finding. Incidental contrast reflux into a left posterior neck vein. Aortic arch: Slightly bovine arch configuration. No arch atherosclerosis. Right carotid system: Mildly tortuous proximal right CCA. Moderate calcified plaque at the right ICA  origin with less than 50 % stenosis with respect to the distal vessel. Mild tortuosity. Left carotid system: Patent, mildly tortuous left CCA. Mild to moderate calcified plaque at the posterior left ICA origin with less than 50 % stenosis with respect to the distal vessel. Mildly tortuous. Vertebral arteries: Negative proximal right subclavian artery and right vertebral artery origin. The right vertebral is mildly tortuous but patent to the skull base without stenosis. Moderate soft more than calcified plaque in the proximal left subclavian artery although no significant stenosis. Left vertebral artery origin remains normal. Codominant left vertebral artery is patent to the skull base without stenosis. CTA HEAD Posterior circulation: Mild right V4 calcified plaque. Patent distal vertebral arteries to the basilar with mild stenosis at the vertebrobasilar junction. Diminutive PICA. Left AICA appears dominant. Patent basilar artery is diminutive but without stenosis. Patent SCA origins with fetal type bilateral PCA origins. Patent basilar tip. Bilateral PCA branches are within normal limits. Anterior circulation: Both ICA siphons are patent. No significant siphon plaque or stenosis. Normal posterior communicating artery origins. Patent carotid termini although the left MCA origin is occluded (series 10, image 19). There is some left MCA branch reconstitution as seen on series 13, image 43. Left ACA origin is normal. There is severe stenosis at the right ACA origin. Anterior communicating artery is diminutive. Bilateral A2 branches appear symmetric and within normal limits. Right MCA origin  is patent but also irregular with mild stenosis. Right MCA M1 and right MCA bifurcation are patent, although with moderate stenosis at the posterior right MCA M2 origin (series 12, image 10). No right MCA branch occlusion identified. Venous sinuses: Early contrast timing, grossly patent. Anatomic variants: Fetal type PCA origins.  Review of the MIP images confirms the above findings IMPRESSION: 1. Positive for emergent large vessel occlusion: Left MCA origin. Some left MCA branch reconstitution. 2. CT Perfusion underestimates infarct core (ASPECTS 6), which is estimated at 11-22 mL, subsequent estimated penumbra of 60 to 67 mL. 3. No other large vessel occlusion. But intracranial atherosclerosis with: - Severe stenosis Right ACA origin. - moderate stenosis Right MCA M2 branch origin. - mild stenosis Right MCA M1, also Vertebrobasilar junction. 4. Cervical carotid atherosclerosis without stenosis. #1 discussed by telephone with Dr. Lesleigh Noe on 06/30/2020 at 0732 hours. And Salient CTP findings discussed at 0749 hours. Electronically Signed   By: Genevie Ann M.D.   On: 06/30/2020 07:58   MR ANGIO HEAD WO CONTRAST  Result Date: 07/01/2020 CLINICAL DATA:  Stroke.  Post left MCA thrombectomy. EXAM: MRI HEAD WITHOUT CONTRAST MRA HEAD WITHOUT CONTRAST TECHNIQUE: Multiplanar, multiecho pulse sequences of the brain and surrounding structures were obtained without intravenous contrast. Angiographic images of the head were obtained using MRA technique without contrast. COMPARISON:  CT head 06/30/2020.  CTA head 06/30/2020 FINDINGS: MRI HEAD FINDINGS Brain: Acute infarct left MCA territory. There is involvement of the left basal ganglia including the caudate and putamen. There is infarct involving the superior left temporal lobe as well as the insula and the left parietal lobe. Small area of hemorrhage is present within the left posterior temporal lobe measuring approximately 1 cm. This was hyperdense on CT yesterday. Scattered small areas of acute infarct in the left frontal lobe. Small area of acute infarct in the right cerebellum and in the occipital white matter bilaterally. Ventricle size normal.  No midline shift.  No mass lesion. Vascular: Normal arterial flow voids Skull and upper cervical spine: Negative Sinuses/Orbits: Mild mucosal edema  paranasal sinuses. Negative orbit Other: None MRA HEAD FINDINGS Decreased signal in the carotid bilaterally at the skull base is felt to be artifact. This area appears widely patent on CTA. Decreased signal left cavernous carotid also artifact. Both cavernous carotids are widely patent on CTA. Moderate stenosis proximal right A1 segment. Mild stenosis right M1. Moderate stenosis right MCA bifurcation. Interval placement of stent in the left middle cerebral artery. There is flow related signal in left MCA branches indicating the stent is patent. Both vertebral arteries patent to the basilar. Basilar widely patent. Right PICA patent. Left AICA patent. Superior cerebellar and posterior cerebral arteries patent bilaterally. Fetal origin left posterior cerebral artery. Right posterior communicating artery is patent. IMPRESSION: Acute infarct left MCA territory involving the basal ganglia as well as the left temporal, frontal, and parietal lobes. Small area of hemorrhage in the left posterior temporal lobe. Additional small areas of acute infarct in the occipital white matter bilaterally and right cerebellum. Left MCA stent appears patent. Moderate intracranial atherosclerotic disease as above. Electronically Signed   By: Franchot Gallo M.D.   On: 07/01/2020 18:55   MR BRAIN WO CONTRAST  Result Date: 07/01/2020 CLINICAL DATA:  Stroke.  Post left MCA thrombectomy. EXAM: MRI HEAD WITHOUT CONTRAST MRA HEAD WITHOUT CONTRAST TECHNIQUE: Multiplanar, multiecho pulse sequences of the brain and surrounding structures were obtained without intravenous contrast. Angiographic images of the head were obtained using  MRA technique without contrast. COMPARISON:  CT head 06/30/2020.  CTA head 06/30/2020 FINDINGS: MRI HEAD FINDINGS Brain: Acute infarct left MCA territory. There is involvement of the left basal ganglia including the caudate and putamen. There is infarct involving the superior left temporal lobe as well as the insula and  the left parietal lobe. Small area of hemorrhage is present within the left posterior temporal lobe measuring approximately 1 cm. This was hyperdense on CT yesterday. Scattered small areas of acute infarct in the left frontal lobe. Small area of acute infarct in the right cerebellum and in the occipital white matter bilaterally. Ventricle size normal.  No midline shift.  No mass lesion. Vascular: Normal arterial flow voids Skull and upper cervical spine: Negative Sinuses/Orbits: Mild mucosal edema paranasal sinuses. Negative orbit Other: None MRA HEAD FINDINGS Decreased signal in the carotid bilaterally at the skull base is felt to be artifact. This area appears widely patent on CTA. Decreased signal left cavernous carotid also artifact. Both cavernous carotids are widely patent on CTA. Moderate stenosis proximal right A1 segment. Mild stenosis right M1. Moderate stenosis right MCA bifurcation. Interval placement of stent in the left middle cerebral artery. There is flow related signal in left MCA branches indicating the stent is patent. Both vertebral arteries patent to the basilar. Basilar widely patent. Right PICA patent. Left AICA patent. Superior cerebellar and posterior cerebral arteries patent bilaterally. Fetal origin left posterior cerebral artery. Right posterior communicating artery is patent. IMPRESSION: Acute infarct left MCA territory involving the basal ganglia as well as the left temporal, frontal, and parietal lobes. Small area of hemorrhage in the left posterior temporal lobe. Additional small areas of acute infarct in the occipital white matter bilaterally and right cerebellum. Left MCA stent appears patent. Moderate intracranial atherosclerotic disease as above. Electronically Signed   By: Franchot Gallo M.D.   On: 07/01/2020 18:55   IR Intra Cran Stent  Result Date: 07/04/2020 INDICATION: Aphasia, left gaze deviation with right-sided weakness. Occluded left middle cerebral artery M1 segment  on CT angiogram of the head and neck. EXAM: 1. EMERGENT LARGE VESSEL OCCLUSION THROMBOLYSIS anterior CIRCULATION) COMPARISON:  CT angiogram of the head and neck of June 30, 2020. MEDICATIONS: Ancef 2 g IV antibiotic was administered within 1 hour of the procedure. ANESTHESIA/SEDATION: General anesthesia CONTRAST:  Isovue 300 approximately 120 mL FLUOROSCOPY TIME:  Fluoroscopy Time: 15 minutes 18 seconds (2667 mGy). COMPLICATIONS: None immediate. TECHNIQUE: Following a full explanation of the procedure along with the potential associated complications, an informed witnessed consent was obtained from the patient's daughter. The risks of intracranial hemorrhage of 10%, worsening neurological deficit, ventilator dependency, death and inability to revascularize were all reviewed in detail with the patient's daughter. The patient was then put under general anesthesia by the Department of Anesthesiology at [facility]. The right groin was prepped and draped in the usual sterile fashion. Thereafter using modified Seldinger technique, transfemoral access into the right common femoral artery was obtained without difficulty. Over a 0.035 inch guidewire an 8 [place] from 25 cm Pinnacle sheath was inserted. Through this, and also over a 0.035 inch guidewire a combination of Simmons 2 5.5 [place] support catheter inside of 087 balloon guide catheter combination was advanced to the aortic arch, and cannulation was performed of the left common carotid artery. Over a 0.035 inch glide guidewire, the support catheter, and the 087 balloon guidewire were advanced to the left common carotid artery bifurcation. The guidewire and support Simmons 2 were removed. Good  aspiration obtained from the hub of the balloon guide catheter. Control arteriogram performed through the balloon guide in the left common carotid artery was performed. FINDINGS: The left common carotid arteriogram demonstrates the left external carotid artery and its  major branches to be widely patent. The left internal carotid artery at the bulb to the cranial skull base demonstrates wide patency with moderate tortuosity in its mid cervical segment. The petrous, cavernous and supraclinoid segments demonstrate wide patency. 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 The left anterior cerebral artery demonstrates approximately 50% stenosis at its A1 segment. However, flow is noted distally into the left anterior cerebral artery distribution. Flash filling of the left posterior communicating artery is also demonstrated. PROCEDURE: Through the balloon guide catheter in the proximal left internal carotid artery, a combination of an 014 inch standard Synchro micro guidewire with an 021 Headway microcatheter and an 071 136 cm Zoom aspiration catheter was advanced as the combination to the supraclinoid left ICA. The balloon guide was advanced further distally into the distal cervical left ICA. Using a torque device, access was obtained into the occluded left middle cerebral artery into the inferior division branch M2 M3 region followed by the microcatheter. The guidewire was removed. Good aspiration obtained from the tip of the microcatheter. A gentle control arteriogram performed through microcatheter demonstrated safe position of the tip of the microcatheter which was now connected to continuous heparinized saline infusion. An Clio retrieval device was then advanced to the distal end of the microcatheter. The O ring on the delivery microcatheter was loosened. The Tiger retrieval device was retrieved and deployed such that the proximal marker was just inside the proximal portion of the occluded left middle cerebral artery. The 071 aspiration catheter was advanced to just proximal to the origin of the right middle cerebral artery. Thereafter the retrieval device was expanded and decreased in size multiple times. 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 The retrieval device was again expanded right at the proximal aspect of the occlusion. Microcatheter was locked into position. Thereafter as constant aspiration was applied at the hub of the aspiration catheter at the origin of the left middle cerebral artery, and with the a 20 mL syringe at the hub of the balloon guide catheter in the left internal carotid artery with proximal occlusion for about 2 minutes, the combination of the retrieval device, the microcatheter and the Zoom aspiration device were retrieved and removed. Following reversal of flow arrest, control arteriogram performed through the balloon guide in the left internal carotid artery now demonstrated complete revascularization of the left middle cerebral artery distribution with a TICI 3 revascularization. However, this also unmasked a severe high-grade stenosis at the left MCA origin. At this point through the balloon guide in the left internal carotid artery, a combination of a 5 [place] Catalyst guide catheter inside of which was the 021 microcatheter was advanced over a 0.014 inch standard Synchro micro guidewire to the supraclinoid left ICA. At this time there was complete occlusion of the left middle cerebral artery due to recoil phenomenon due to the atherosclerotic plaque. Micro guidewire was then gently advanced through the occluded left middle cerebral artery without difficulty in the superior division followed by the microcatheter. The guidewire was removed. Good aspiration obtained from the hub of the microcatheter. A gentle control arteriogram  performed through this demonstrated safe position of the tip of the microcatheter. This in turn was then replaced with an 014 inch supported 300 cm Synchro micro guidewire with a J-tip configuration under  constant fluoroscopic guidance. The tip of the exchange micro guidewire was maintained as the microcatheter was removed. Measurements were then performed of the left middle cerebral artery in its most normal segment just distal to the occlusion. It was elected to proceed with placement of a 2.25 mm x 12 mm Synergy drug-eluting stent. This was a retrogradely prepped with heparinized saline infusion, and also with 50% contrast and 50% heparinized saline infusion. Using the rapid exchange technique, the stent delivery system was advanced without difficulty to the supraclinoid left ICA. The delivery of the stent was then advanced without difficulty through the occluded left middle cerebral artery. Its proximal marker was positioned just proximal to the occluded left middle cerebral artery proximally. Thereafter, a control inflation was then performed using micro inflation syringe device via micro tubing with the balloon inflated to approximately 2.1 mm where it was maintained for approximately 20 seconds. Thereafter, with the wire distal, the balloon was retrieved and removed. A control arteriogram performed through the 5 [place] guide catheter in the left internal carotid artery demonstrated complete angiographic revascularization of the left middle cerebral artery distribution achieving a TICI 3 revascularization. Also noted now was patency of the left posterior cerebral artery distribution via the posterior communicating artery. Control arteriograms were then performed at 15 and 30 minutes post deployment of the stent which continued to demonstrate excellent flow through the stented segment also with patency of the superior and inferior division branches into the more distal M4 and M5 regions of the left middle cerebral artery distribution. Final control arteriogram performed through the balloon guide in the left internal carotid artery after removal of the 5 [place] Catalyst guide catheter, and also the exchange micro  guidewire demonstrated continued excellent apposition and patency of the angioplastied segment of the left middle cerebral artery with no intra stent abnormalities. A distal perfusion was now noted into the M4 M5 regions. Moderate spasm at the middle cervical left ICA responded to 25 mcg of nitroglycerin intra-arterially. The left anterior cerebral artery remained widely patent with cross filling of the right anterior cerebral A2 segment as described earlier. Balloon guide was removed. The Pinnacle sheath was removed with successful hemostasis with an 8 French Angio-Seal closure device. Distal pulses remained Dopplerable in both feet unchanged. Patient was left intubated to protect her airway as per anesthesia. An immediate CT scan of the brain performed on the table demonstrated contrast stain in the left basal ganglia region, and also the left parietal subcortical area. Patient was loaded with aspirin 81 mg, and Brilinta 180 mg p.o. via an orogastric tube just prior to the balloon angioplasty. A loading dose of cangrelor IV was given right after placement of the stent, with a 4 hr low-dose infusion with a CT of the brain to follow. The patient's pupils were approximately 2 mm bilaterally though sluggish. The patient was then transferred to the PACU and then neuro ICU for post revascularization treatment. IMPRESSION: Status post endovascular complete revascularization of occluded left middle cerebral artery M1 segment with 1 pass with the Tiger 17 retrieval device and proximal aspiration followed by placement of a 2.25 mm x 12 mm Synergy balloon mounted drug-eluting stent with achievement of a TICI 3 revascularization. PLAN: Follow-up in the clinic approximately 4 weeks post discharge. Electronically Signed   By:  Luanne Bras M.D.   On: 07/01/2020 12:48   IR CT Head Ltd  Result Date: 07/04/2020 INDICATION: Aphasia, left gaze deviation with right-sided weakness. Occluded left middle cerebral artery M1  segment on CT angiogram of the head and neck. EXAM: 1. EMERGENT LARGE VESSEL OCCLUSION THROMBOLYSIS anterior CIRCULATION) COMPARISON:  CT angiogram of the head and neck of June 30, 2020. MEDICATIONS: Ancef 2 g IV antibiotic was administered within 1 hour of the procedure. ANESTHESIA/SEDATION: General anesthesia CONTRAST:  Isovue 300 approximately 120 mL FLUOROSCOPY TIME:  Fluoroscopy Time: 15 minutes 18 seconds (2667 mGy). COMPLICATIONS: None immediate. TECHNIQUE: Following a full explanation of the procedure along with the potential associated complications, an informed witnessed consent was obtained from the patient's daughter. The risks of intracranial hemorrhage of 10%, worsening neurological deficit, ventilator dependency, death and inability to revascularize were all reviewed in detail with the patient's daughter. The patient was then put under general anesthesia by the Department of Anesthesiology at [facility]. The right groin was prepped and draped in the usual sterile fashion. Thereafter using modified Seldinger technique, transfemoral access into the right common femoral artery was obtained without difficulty. Over a 0.035 inch guidewire an 8 [place] from 25 cm Pinnacle sheath was inserted. Through this, and also over a 0.035 inch guidewire a combination of Simmons 2 5.5 [place] support catheter inside of 087 balloon guide catheter combination was advanced to the aortic arch, and cannulation was performed of the left common carotid artery. Over a 0.035 inch glide guidewire, the support catheter, and the 087 balloon guidewire were advanced to the left common carotid artery bifurcation. The guidewire and support Simmons 2 were removed. Good aspiration obtained from the hub of the balloon guide catheter. Control arteriogram performed through the balloon guide in the left common carotid artery was performed. FINDINGS: The left common carotid arteriogram demonstrates the left external carotid artery  and its major branches to be widely patent. The left internal carotid artery at the bulb to the cranial skull base demonstrates wide patency with moderate tortuosity in its mid cervical segment. The petrous, cavernous and supraclinoid segments demonstrate wide patency. 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 The left anterior cerebral artery demonstrates approximately 50% stenosis at its A1 segment. However, flow is noted distally into the left anterior cerebral artery distribution. Flash filling of the left posterior communicating artery is also demonstrated. PROCEDURE: Through the balloon guide catheter in the proximal left internal carotid artery, a combination of an 014 inch standard Synchro micro guidewire with an 021 Headway microcatheter and an 071 136 cm Zoom aspiration catheter was advanced as the combination to the supraclinoid left ICA. The balloon guide was advanced further distally into the distal cervical left ICA. Using a torque device, access was obtained into the occluded left middle cerebral artery into the inferior division branch M2 M3 region followed by the microcatheter. The guidewire was removed. Good aspiration obtained from the tip of the microcatheter. A gentle control arteriogram performed through microcatheter demonstrated safe position of the tip of the microcatheter which was now connected to continuous heparinized saline infusion. An [place] retrieval device was then advanced to the distal end of the microcatheter. The O ring on the delivery microcatheter was loosened. The Tiger retrieval device was retrieved and deployed such that the proximal marker was just inside the proximal portion of the occluded left middle cerebral artery. The 071 aspiration catheter was advanced to just proximal to the origin  of the right middle cerebral artery. Thereafter the retrieval device was expanded and decreased in size multiple times. 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 The retrieval device was again expanded right at the proximal aspect of the occlusion. Microcatheter was locked into position. Thereafter as constant aspiration was applied at the hub of the aspiration catheter at the origin of the left middle cerebral artery, and with the a 20 mL syringe at the hub of the balloon guide catheter in the left internal carotid artery with proximal occlusion for about 2 minutes, the combination of the retrieval device, the microcatheter and the Zoom aspiration device were retrieved and removed. Following reversal of flow arrest, control arteriogram performed through the balloon guide in the left internal carotid artery now demonstrated complete revascularization of the left middle cerebral artery distribution with a TICI 3 revascularization. However, this also unmasked a severe high-grade stenosis at the left MCA origin. At this point through the balloon guide in the left internal carotid artery, a combination of a 5 [place] Catalyst guide catheter inside of which was the 021 microcatheter was advanced over a 0.014 inch standard Synchro micro guidewire to the supraclinoid left ICA. At this time there was complete occlusion of the left middle cerebral artery due to recoil phenomenon due to the atherosclerotic plaque. Micro guidewire was then gently advanced through the occluded left middle cerebral artery without difficulty in the superior division followed by the microcatheter. The guidewire was removed. Good aspiration obtained from the hub of the microcatheter. A gentle control arteriogram performed through this demonstrated safe position of the tip of the microcatheter. This in turn was then replaced with an 014 inch supported 300 cm Synchro micro guidewire with a J-tip  configuration under constant fluoroscopic guidance. The tip of the exchange micro guidewire was maintained as the microcatheter was removed. Measurements were then performed of the left middle cerebral artery in its most normal segment just distal to the occlusion. It was elected to proceed with placement of a 2.25 mm x 12 mm Synergy drug-eluting stent. This was a retrogradely prepped with heparinized saline infusion, and also with 50% contrast and 50% heparinized saline infusion. Using the rapid exchange technique, the stent delivery system was advanced without difficulty to the supraclinoid left ICA. The delivery of the stent was then advanced without difficulty through the occluded left middle cerebral artery. Its proximal marker was positioned just proximal to the occluded left middle cerebral artery proximally. Thereafter, a control inflation was then performed using micro inflation syringe device via micro tubing with the balloon inflated to approximately 2.1 mm where it was maintained for approximately 20 seconds. Thereafter, with the wire distal, the balloon was retrieved and removed. A control arteriogram performed through the 5 [place] guide catheter in the left internal carotid artery demonstrated complete angiographic revascularization of the left middle cerebral artery distribution achieving a TICI 3 revascularization. Also noted now was patency of the left posterior cerebral artery distribution via the posterior communicating artery. Control arteriograms were then performed at 15 and 30 minutes post deployment of the stent which continued to demonstrate excellent flow through the stented segment also with patency of the superior and inferior division branches into the more distal M4 and M5 regions of the left middle cerebral artery distribution. Final control arteriogram performed through the balloon guide in the left internal carotid artery after  removal of the 5 [place] Catalyst guide catheter, and  also the exchange micro guidewire demonstrated continued excellent apposition and patency of the angioplastied segment of the left middle cerebral artery with no intra stent abnormalities. A distal perfusion was now noted into the M4 M5 regions. Moderate spasm at the middle cervical left ICA responded to 25 mcg of nitroglycerin intra-arterially. The left anterior cerebral artery remained widely patent with cross filling of the right anterior cerebral A2 segment as described earlier. Balloon guide was removed. The Pinnacle sheath was removed with successful hemostasis with an 8 French Angio-Seal closure device. Distal pulses remained Dopplerable in both feet unchanged. Patient was left intubated to protect her airway as per anesthesia. An immediate CT scan of the brain performed on the table demonstrated contrast stain in the left basal ganglia region, and also the left parietal subcortical area. Patient was loaded with aspirin 81 mg, and Brilinta 180 mg p.o. via an orogastric tube just prior to the balloon angioplasty. A loading dose of cangrelor IV was given right after placement of the stent, with a 4 hr low-dose infusion with a CT of the brain to follow. The patient's pupils were approximately 2 mm bilaterally though sluggish. The patient was then transferred to the PACU and then neuro ICU for post revascularization treatment. IMPRESSION: Status post endovascular complete revascularization of occluded left middle cerebral artery M1 segment with 1 pass with the Tiger 17 retrieval device and proximal aspiration followed by placement of a 2.25 mm x 12 mm Synergy balloon mounted drug-eluting stent with achievement of a TICI 3 revascularization. PLAN: Follow-up in the clinic approximately 4 weeks post discharge. Electronically Signed   By: Luanne Bras M.D.   On: 07/01/2020 12:48   CT Code Stroke Cerebral Perfusion with contrast  Result Date: 06/30/2020 CLINICAL DATA:  65 year old female code stroke  presentation with left MCA ASPECTS 6. Left gaze deviation. EXAM: CT ANGIOGRAPHY HEAD AND NECK CT PERFUSION BRAIN TECHNIQUE: Multidetector CT imaging of the head and neck was performed using the standard protocol during bolus administration of intravenous contrast. Multiplanar CT image reconstructions and MIPs were obtained to evaluate the vascular anatomy. Carotid stenosis measurements (when applicable) are obtained utilizing NASCET criteria, using the distal internal carotid diameter as the denominator. Multiphase CT imaging of the brain was performed following IV bolus contrast injection. Subsequent parametric perfusion maps were calculated using RAPID software. CONTRAST:  172mL OMNIPAQUE IOHEXOL 350 MG/ML SOLN COMPARISON:  Plain head CT today 0721 hours. [facility] brain MRI 02/15/2016 FINDINGS: CT Brain Perfusion Findings: ASPECTS: 6 CBF (<30%) Volume: 59mL (erroneous). Using CBF less than 38% 11 mL of parenchyma is detected which does partially correspond to the area of cytotoxic edema by plain CT. Perfusion (Tmax>6.0s) volume: 74mL, hypoperfusion index 0.1 but also might be spurrious. Mismatch Volume: Calculated is not accurate due to erroneously low infarct core, estimated penumbra given the above is 60 to 67 mL. Infarction Location:Left MCA The above was discussed by telephone with Dr. Lesleigh Noe on 06/30/2020 at 07:49 . CTA NECK Skeleton: No acute osseous abnormality identified. Dystrophic/degenerative calcifications of the longus coli muscle insertion at C1-C2. Upper chest: Negative. Other neck: No acute finding. Incidental contrast reflux into a left posterior neck vein. Aortic arch: Slightly bovine arch configuration. No arch atherosclerosis. Right carotid system: Mildly tortuous proximal right CCA. Moderate calcified plaque at the right ICA origin with less than 50 % stenosis with respect to the distal vessel. Mild tortuosity. Left  carotid system: Patent,  mildly tortuous left CCA. Mild to moderate calcified plaque at the posterior left ICA origin with less than 50 % stenosis with respect to the distal vessel. Mildly tortuous. Vertebral arteries: Negative proximal right subclavian artery and right vertebral artery origin. The right vertebral is mildly tortuous but patent to the skull base without stenosis. Moderate soft more than calcified plaque in the proximal left subclavian artery although no significant stenosis. Left vertebral artery origin remains normal. Codominant left vertebral artery is patent to the skull base without stenosis. CTA HEAD Posterior circulation: Mild right V4 calcified plaque. Patent distal vertebral arteries to the basilar with mild stenosis at the vertebrobasilar junction. Diminutive PICA. Left AICA appears dominant. Patent basilar artery is diminutive but without stenosis. Patent SCA origins with fetal type bilateral PCA origins. Patent basilar tip. Bilateral PCA branches are within normal limits. Anterior circulation: Both ICA siphons are patent. No significant siphon plaque or stenosis. Normal posterior communicating artery origins. Patent carotid termini although the left MCA origin is occluded (series 10, image 19). There is some left MCA branch reconstitution as seen on series 13, image 43. Left ACA origin is normal. There is severe stenosis at the right ACA origin. Anterior communicating artery is diminutive. Bilateral A2 branches appear symmetric and within normal limits. Right MCA origin is patent but also irregular with mild stenosis. Right MCA M1 and right MCA bifurcation are patent, although with moderate stenosis at the posterior right MCA M2 origin (series 12, image 10). No right MCA branch occlusion identified. Venous sinuses: Early contrast timing, grossly patent. Anatomic variants: Fetal type PCA origins. Review of the MIP images confirms the above findings IMPRESSION: 1. Positive for emergent large vessel occlusion: Left  MCA origin. Some left MCA branch reconstitution. 2. CT Perfusion underestimates infarct core (ASPECTS 6), which is estimated at 11-22 mL, subsequent estimated penumbra of 60 to 67 mL. 3. No other large vessel occlusion. But intracranial atherosclerosis with: - Severe stenosis Right ACA origin. - moderate stenosis Right MCA M2 branch origin. - mild stenosis Right MCA M1, also Vertebrobasilar junction. 4. Cervical carotid atherosclerosis without stenosis. #1 discussed by telephone with Dr. Lesleigh Noe on 06/30/2020 at 0732 hours. And [facility] CTP findings discussed at 0749 hours. Electronically Signed   By: Genevie Ann M.D.   On: 06/30/2020 07:58   DG CHEST PORT 1 VIEW  Result Date: 07/02/2020 CLINICAL DATA:  Fever, shortness of breath, hypertension. Status post LEFT MCA thrombectomy. EXAM: PORTABLE CHEST 1 VIEW COMPARISON:  Chest x-ray dated 06/30/2020. FINDINGS: Endotracheal tube has been removed. Enteric tube passes below the diaphragm. Heart size and mediastinal contours appear stable. Lungs are clear. No pleural effusion or pneumothorax is seen. IMPRESSION: No active disease. No evidence of pneumonia or pulmonary edema. Electronically Signed   By: Franki Cabot M.D.   On: 07/02/2020 11:01   Portable Chest x-ray  Result Date: 06/30/2020 CLINICAL DATA:  Evaluate endotracheal tube placement EXAM: PORTABLE CHEST 1 VIEW COMPARISON:  09/17/2019 FINDINGS: The tip of the ET tube appears oriented towards the left mainstem bronchus. Consider withdrawing by 2 cm. The enteric tube tip is in the proximal stomach. The side port for the enteric tube is just above the GE junction. Heart size normal. No pleural effusion, interstitial edema or airspace consolidation. IMPRESSION: 1. ET tube tip appears oriented towards the left mainstem bronchus. Consider withdrawing by 2 cm. 2. Consider advancement of enteric tube. 3. Lungs appear clear. 4. These results will be called to the ordering  clinician or representative by the  Radiologist Assistant, and communication documented in the PACS or [employer]. Electronically Signed   By: Kerby Moors M.D.   On: 06/30/2020 13:59   DG Abd Portable 1V  Result Date: 07/04/2020 CLINICAL DATA:  Enteric tube placement, history of gastric bypass EXAM: PORTABLE ABDOMEN - 1 VIEW COMPARISON:  07/01/2020 abdominal radiograph FINDINGS: Weighted enteric tube terminates in mid to lower left abdomen within a proximal small bowel loop. Cholecystectomy clips are seen in the right upper quadrant of the abdomen. Surgical sutures are noted in the medial upper abdomen bilaterally. No dilated small bowel loops. No evidence of pneumatosis or pneumoperitoneum. No radiopaque nephrolithiasis. IMPRESSION: Weighted enteric tube terminates in the mid to lower left abdomen within a proximal small bowel loop in this patient with a history of gastric bypass surgery. Electronically Signed   By: Ilona Sorrel M.D.   On: 07/04/2020 12:01   DG Abd Portable 1V  Result Date: 07/01/2020 CLINICAL DATA:  Feeding tube placement EXAM: PORTABLE ABDOMEN - 1 VIEW COMPARISON:  CT abdomen 07/14/2019 FINDINGS: A feeding tube is noted extending inferiorly into the left to terminate just above the left iliac crest, presumably in a somewhat dilated stomach given the low location. Otherwise unremarkable bowel gas pattern. Clips in the right upper quadrant likely from prior cholecystectomy. IMPRESSION: 1. The feeding tube extends inferiorly into the left abdomen, presumably in a somewhat dilated stomach given the low location. Otherwise unremarkable bowel gas pattern. 2. Status post cholecystectomy. Electronically Signed   By: Van Clines M.D.   On: 07/01/2020 16:08   DG Swallowing Func-Speech Pathology  Result Date: 07/04/2020 Objective Swallowing Evaluation: Type of Study: MBS-Modified Barium Swallow Study  Patient Details Name: Courtney Burke MRN: [id number] Date of Birth: [birth year] Today's Date: 07/04/2020 Time: SLP Start  Time (ACUTE ONLY): 1419 -SLP Stop Time (ACUTE ONLY): 1434 SLP Time Calculation (min) (ACUTE ONLY): 15 min Past Medical History: Past Medical History: Diagnosis Date . Asthma  . Borderline diabetes  . Hypertension  Past Surgical History: Past Surgical History: Procedure Laterality Date . ANKLE SURGERY   . CARPAL TUNNEL RELEASE   . carpel tunnel   . IR CT HEAD LTD  06/30/2020 . IR INTRA CRAN STENT  06/30/2020 . IR PERCUTANEOUS ART THROMBECTOMY/INFUSION INTRACRANIAL INC DIAG ANGIO  06/30/2020 . RADIOLOGY WITH ANESTHESIA N/A 06/30/2020  Procedure: RADIOLOGY WITH ANESTHESIA;  Surgeon: Radiologist, Medication, MD;  Location: [place];  Service: Radiology;  Laterality: N/A; HPI: Courtney Burke is a 65 y.o. 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 CT probable few small areas of acute left MCA infarction, less apparent. Ill-defined patchy hyperdensity may reflect contrast. Found to have M1 occlusion and underwent thrombectomy and intubated 1/27-1/28 am. MRI pending.  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 She cleared oral cavity with mechanical soft textures. Consistently lingual residue fell to pyriform sinuses after swallow with spontaneous subswallow to clear. Her intact strength and ROM allowed her to achieve complete laryngeal protection. However, mistimed initiation of protection in addition to larger sip with straw, led to trace aspiration stopping just below her vocal cords. A weak reflexive throat clear audible after 10 second delay. Pt's  comprehension and execution of postures may not be consistent and able to rely on for safe consumption. Recommend initiate Dys  2 (fine chopped), thin liquids, NO straws, full supervision/assist, check for oral pocketing and pills whole in puree. Esophageal scan was unremarkable. SLP Visit Diagnosis Dysphagia, oropharyngeal phase (R13.12) Attention and concentration deficit following -- Frontal lobe and executive function deficit following -- Impact on safety and function Mild aspiration risk;Moderate aspiration risk   CHL IP TREATMENT RECOMMENDATION 07/04/2020 Treatment Recommendations Therapy as outlined in treatment plan below   Prognosis 07/04/2020 Prognosis for Safe Diet Advancement Good Barriers to Reach Goals -- Barriers/Prognosis Comment -- CHL IP DIET RECOMMENDATION 07/04/2020 SLP Diet Recommendations Dysphagia 2 (Fine chop) solids;Thin liquid Liquid Administration via No straw;Cup Medication Administration Whole meds with puree Compensations Slow rate;Small sips/bites;Lingual sweep for clearance of pocketing;Other (Comment) Postural Changes Seated upright at 90 degrees   CHL IP OTHER RECOMMENDATIONS 07/04/2020 Recommended Consults -- Oral Care Recommendations Oral care BID Other Recommendations --   CHL IP FOLLOW UP RECOMMENDATIONS 07/04/2020 Follow up Recommendations Inpatient Rehab   CHL IP FREQUENCY AND DURATION 07/04/2020 Speech Therapy Frequency (ACUTE ONLY) min 2x/week Treatment Duration 2 weeks      CHL IP ORAL PHASE 07/04/2020 Oral Phase Impaired Oral - Pudding Teaspoon -- Oral - Pudding Cup -- Oral - Honey Teaspoon -- Oral - Honey Cup -- Oral - Nectar Teaspoon -- Oral - Nectar Cup Delayed oral transit;Piecemeal swallowing Oral - Nectar Straw Delayed oral transit;Piecemeal swallowing Oral - Thin Teaspoon -- Oral - Thin Cup Right anterior bolus loss Oral - Thin Straw Delayed oral transit Oral - Puree Delayed oral transit;Other (Comment) Oral - Mech Soft Delayed oral transit Oral - Regular -- Oral - Multi-Consistency -- Oral - Pill -- Oral Phase - Comment --  CHL IP PHARYNGEAL PHASE 07/04/2020 Pharyngeal Phase Impaired Pharyngeal-  Pudding Teaspoon -- Pharyngeal -- Pharyngeal- Pudding Cup -- Pharyngeal -- Pharyngeal- Honey Teaspoon -- Pharyngeal -- Pharyngeal- Honey Cup -- Pharyngeal -- Pharyngeal- Nectar Teaspoon -- Pharyngeal -- Pharyngeal- Nectar Cup WFL Pharyngeal -- Pharyngeal- Nectar Straw WFL Pharyngeal Other (Comment) Pharyngeal- Thin Teaspoon -- Pharyngeal -- Pharyngeal- Thin Cup WFL Pharyngeal -- Pharyngeal- Thin Straw Penetration/Aspiration during swallow Pharyngeal Other (Comment) Pharyngeal- Puree WFL Pharyngeal -- Pharyngeal- Mechanical Soft WFL Pharyngeal -- Pharyngeal- Regular -- Pharyngeal -- Pharyngeal- Multi-consistency -- Pharyngeal -- Pharyngeal- Pill -- Pharyngeal -- Pharyngeal Comment --  CHL IP CERVICAL ESOPHAGEAL PHASE 07/04/2020 Cervical Esophageal Phase WFL Pudding Teaspoon -- Pudding Cup -- Honey Teaspoon -- Honey Cup -- Nectar Teaspoon -- Nectar Cup -- Nectar Straw -- Thin Teaspoon -- Thin Cup -- Thin Straw -- Puree -- Mechanical Soft -- Regular -- Multi-consistency -- Pill -- Cervical Esophageal Comment -- [place] Siren 07/04/2020, 3:24 PM Orbie Pyo Litaker M.Ed Actor Pager [phone] Office [phone]              EEG adult  Result Date: 07/01/2020 Lora Havens, MD     07/01/2020 10:52 AM Patient Name: Courtney Burke MRN: [id number] Epilepsy Attending: Lora Havens Referring Physician/Provider: Dr. Rosalin Hawking Date: 07/01/2020 Duration: 24.04 mins Patient history: 65 year old female with recent left MCA stroke status post thrombectomy. EEG to evaluate for seizures. Level of alertness: Awake AEDs during EEG study: None Technical aspects: This EEG study was done with scalp electrodes positioned according to the 10-20 International system of electrode placement. Electrical activity was acquired at a sampling rate of 500Hz  and reviewed with a high frequency filter of 70Hz  and a  low frequency filter of 1Hz . EEG data were recorded continuously and digitally stored. Description:  The posterior dominant rhythm consists of 8 Hz activity of moderate voltage (25-35 uV) seen predominantly in posterior head regions, symmetric and reactive to eye opening and eye closing.  EEG showed intermittent generalized and maximal left frontal region 3 to 5 Hz theta-delta slowing.  The 3 to 5 Hz theta and delta slowing in the left frontal region appeared sharply contoured and waxes and wanes without definite evolution.  Hyperventilation and photic stimulation were not performed.   ABNORMALITY -Frontal intermittent rhythmic delta activity, left frontal region -Intermittent slow, generalized and maximal left frontal region IMPRESSION: This study is suggestive of cortical dysfunction in left frontal region likely secondary underlying stroke as well as mild diffuse encephalopathy, nonspecific etiology.  Intermittent rhythmic delta activity in left frontal region is on the ictal-interictal continuum with low to intermediate potential for seizures.  No seizures or epileptiform discharges were seen throughout the recording. Lora Havens   ECHOCARDIOGRAM COMPLETE  Result Date: 07/01/2020    ECHOCARDIOGRAM REPORT   Patient Name:   Courtney Burke Date of Exam: 07/01/2020 Medical Rec #:  [id number]     Height:       62.0 in Accession #:    [id number]    Weight:       207.0 lb Date of Birth:  1955/09/25     BSA:          1.940 m Patient Age:    64 years      BP:           119/64 mmHg Patient Gender: F             HR:           81 bpm. Exam Location:  Inpatient Procedure: 2D Echo Indications:    Stroke I163.9  History:        Patient has no prior history of Echocardiogram examinations.                 Risk Factors:Hypertension.  Sonographer:    Mikki Santee RDCS (AE) Referring Phys: [place]  1. Left ventricular ejection fraction, by estimation, is 60 to 65%. The left ventricle has normal function. The left ventricle has no regional wall motion abnormalities. There is moderate left  ventricular hypertrophy. Left ventricular diastolic parameters are consistent with Grade I diastolic dysfunction (impaired relaxation).  2. Right ventricular systolic function is normal. The right ventricular size is normal. There is normal pulmonary artery systolic pressure. The estimated right ventricular systolic pressure is [id number] mmHg.  3. The mitral valve is grossly normal. Trivial mitral valve regurgitation.  4. The aortic valve is tricuspid. Aortic valve regurgitation is not visualized.  5. The inferior vena cava is normal in size with greater than 50% respiratory variability, suggesting right atrial pressure of 3 mmHg. Comparison(s): No prior Echocardiogram. FINDINGS  Left Ventricle: Left ventricular ejection fraction, by estimation, is 60 to 65%. The left ventricle has normal function. The left ventricle has no regional wall motion abnormalities. The left ventricular internal cavity size was normal in size. There is  moderate left ventricular hypertrophy. Left ventricular diastolic parameters are consistent with Grade I diastolic dysfunction (impaired relaxation). Indeterminate filling pressures. Right Ventricle: The right ventricular size is normal. No increase in right ventricular wall thickness. Right ventricular systolic function is normal. There is normal pulmonary artery systolic pressure. The tricuspid regurgitant velocity is 2.71 m/s, and  with an  assumed right atrial pressure of 3 mmHg, the estimated right ventricular systolic pressure is [id number] mmHg. Left Atrium: Left atrial size was normal in size. Right Atrium: Right atrial size was normal in size. Pericardium: There is no evidence of pericardial effusion. Mitral Valve: The mitral valve is grossly normal. Trivial mitral valve regurgitation. Tricuspid Valve: The tricuspid valve is grossly normal. Tricuspid valve regurgitation is trivial. Aortic Valve: The aortic valve is tricuspid. Aortic valve regurgitation is not visualized. Pulmonic Valve: The  pulmonic valve was normal in structure. Pulmonic valve regurgitation is not visualized. Aorta: The aortic root and ascending aorta are structurally normal, with no evidence of dilitation. Venous: The inferior vena cava is normal in size with greater than 50% respiratory variability, suggesting right atrial pressure of 3 mmHg. IAS/Shunts: No atrial level shunt detected by color flow Doppler.  LEFT VENTRICLE PLAX 2D LVIDd:         3.60 cm  Diastology LVIDs:         2.40 cm  LV e' medial:    6.53 cm/s LV PW:         1.30 cm  LV E/e' medial:  9.4 LV IVS:        1.30 cm  LV e' lateral:   6.85 cm/s LVOT diam:     2.10 cm  LV E/e' lateral: 8.9 LV SV:         86 LV SV Index:   44 LVOT Area:     3.46 cm  RIGHT VENTRICLE RV S prime:     14.40 cm/s TAPSE (M-mode): 2.3 cm LEFT ATRIUM             Index       RIGHT ATRIUM           Index LA diam:        3.40 cm 1.75 cm/m  RA Area:     14.80 cm LA Vol (A2C):   36.6 ml 18.87 ml/m RA Volume:   35.40 ml  18.25 ml/m LA Vol (A4C):   48.9 ml 25.21 ml/m LA Biplane Vol: 44.7 ml 23.04 ml/m  AORTIC VALVE LVOT Vmax:   107.00 cm/s LVOT Vmean:  69.700 cm/s LVOT VTI:    0.248 m  AORTA Ao Root diam: 2.80 cm Ao Asc diam:  3.20 cm MITRAL VALVE               TRICUSPID VALVE MV Area (PHT): 2.87 cm    TR Peak grad:   29.4 mmHg MV Decel Time: 264 msec    TR Vmax:        271.00 cm/s MV E velocity: 61.10 cm/s MV A velocity: 99.90 cm/s  SHUNTS MV E/A ratio:  0.61        Systemic VTI:  0.25 m                            Systemic Diam: 2.10 cm Lyman Bishop MD Electronically signed by Lyman Bishop MD Signature Date/Time: 07/01/2020/1:26:54 PM    Final    IR PERCUTANEOUS ART THROMBECTOMY/INFUSION INTRACRANIAL INC DIAG ANGIO  Result Date: 07/04/2020 INDICATION: Aphasia, left gaze deviation with right-sided weakness. 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 EXAM: 1. EMERGENT LARGE VESSEL OCCLUSION THROMBOLYSIS anterior CIRCULATION) COMPARISON:  CT  angiogram of the head and neck of June 30, 2020. MEDICATIONS: Ancef 2 g IV antibiotic was administered within 1 hour of the procedure.  ANESTHESIA/SEDATION: General anesthesia CONTRAST:  Isovue 300 approximately 120 mL FLUOROSCOPY TIME:  Fluoroscopy Time: 15 minutes 18 seconds (2667 mGy). COMPLICATIONS: None immediate. TECHNIQUE: Following a full explanation of the procedure along with the potential associated complications, an informed witnessed consent was obtained from the patient's daughter. The risks of intracranial hemorrhage of 10%, worsening neurological deficit, ventilator dependency, death and inability to revascularize were all reviewed in detail with the patient's daughter. The patient was then put under general anesthesia by the Department of Anesthesiology at [facility]. The right groin was prepped and draped in the usual sterile fashion. Thereafter using modified Seldinger technique, transfemoral access into the right common femoral artery was obtained without difficulty. Over a 0.035 inch guidewire an 8 [place] from 25 cm Pinnacle sheath was inserted. Through this, and also over a 0.035 inch guidewire a combination of Simmons 2 5.5 [place] support catheter inside of 087 balloon guide catheter combination was advanced to the aortic arch, and cannulation was performed of the left common carotid artery. Over a 0.035 inch glide guidewire, the support catheter, and the 087 balloon guidewire were advanced to the left common carotid artery bifurcation. The guidewire and support Simmons 2 were removed. Good aspiration obtained from the hub of the balloon guide catheter. Control arteriogram performed through the balloon guide in the left common carotid artery was performed. FINDINGS: The left common carotid arteriogram demonstrates the left external carotid artery and its major branches to be widely patent. The left internal carotid artery at the bulb to the cranial skull base demonstrates wide  patency with moderate tortuosity in its mid cervical segment. The petrous, cavernous and supraclinoid segments demonstrate wide patency. 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 The left anterior cerebral artery demonstrates approximately 50% stenosis at its A1 segment. However, flow is noted distally into the left anterior cerebral artery distribution. Flash filling of the left posterior communicating artery is also demonstrated. PROCEDURE: Through the balloon guide catheter in the proximal left internal carotid artery, a combination of an 014 inch standard Synchro micro guidewire with an 021 Head[place] microcatheter and an 071 136 cm Zoom aspiration catheter was advanced as the combination to the supraclinoid left ICA. The balloon guide was advanced further distally into the distal cervical left ICA. Using a torque device, access was obtained into the occluded left middle cerebral artery into the inferior division branch M2 M3 region followed by the microcatheter. The guidewire was removed. Good aspiration obtained from the tip of the microcatheter. A gentle control arteriogram performed through microcatheter demonstrated safe position of the tip of the microcatheter which was now connected to continuous heparinized saline infusion. An [place] retrieval device was then advanced to the distal end of the microcatheter. The O ring on the delivery microcatheter was loosened. The Tiger retrieval device was retrieved and deployed such that the proximal marker was just inside the proximal portion of the occluded left middle cerebral artery. The 071 aspiration catheter was advanced to just proximal to the origin of the right middle cerebral artery. Thereafter the retrieval device was expanded and decreased in size multiple times. 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 The retrieval device was again expanded right at the proximal aspect of the occlusion. Microcatheter was locked into position. Thereafter as constant aspiration was applied at the hub of the aspiration  catheter at the origin of the left middle cerebral artery, and with the a 20 mL syringe at the hub of the balloon guide catheter in the left internal carotid artery with proximal occlusion for about 2 minutes, the combination of the retrieval device, the microcatheter and the Zoom aspiration device were retrieved and removed. Following reversal of flow arrest, control arteriogram performed through the balloon guide in the left internal carotid artery now demonstrated complete revascularization of the left middle cerebral artery distribution with a TICI 3 revascularization. However, this also unmasked a severe high-grade stenosis at the left MCA origin. At this point through the balloon guide in the left internal carotid artery, a combination of a 5 [place] Catalyst guide catheter inside of which was the 021 microcatheter was advanced over a 0.014 inch standard Synchro micro guidewire to the supraclinoid left ICA. At this time there was complete occlusion of the left middle cerebral artery due to recoil phenomenon due to the atherosclerotic plaque. Micro guidewire was then gently advanced through the occluded left middle cerebral artery without difficulty in the superior division followed by the microcatheter. The guidewire was removed. Good aspiration obtained from the hub of the microcatheter. A gentle control arteriogram performed through this demonstrated safe position of the tip of the microcatheter. This in turn was then replaced with an 014 inch supported 300 cm Synchro micro guidewire with a J-tip configuration under constant fluoroscopic guidance. The tip of the exchange micro guidewire was maintained as the microcatheter was removed.  Measurements were then performed of the left middle cerebral artery in its most normal segment just distal to the occlusion. It was elected to proceed with placement of a 2.25 mm x 12 mm Synergy drug-eluting stent. This was a retrogradely prepped with heparinized saline infusion, and also with 50% contrast and 50% heparinized saline infusion. Using the rapid exchange technique, the stent delivery system was advanced without difficulty to the supraclinoid left ICA. The delivery of the stent was then advanced without difficulty through the occluded left middle cerebral artery. Its proximal marker was positioned just proximal to the occluded left middle cerebral artery proximally. Thereafter, a control inflation was then performed using micro inflation syringe device via micro tubing with the balloon inflated to approximately 2.1 mm where it was maintained for approximately 20 seconds. Thereafter, with the wire distal, the balloon was retrieved and removed. A control arteriogram performed through the 5 [place] guide catheter in the left internal carotid artery demonstrated complete angiographic revascularization of the left middle cerebral artery distribution achieving a TICI 3 revascularization. Also noted now was patency of the left posterior cerebral artery distribution via the posterior communicating artery. Control arteriograms were then performed at 15 and 30 minutes post deployment of the stent which continued to demonstrate excellent flow through the stented segment also with patency of the superior and inferior division branches into the more distal M4 and M5 regions of the left middle cerebral artery distribution. Final control arteriogram performed through the balloon guide in the left internal carotid artery after removal of the 5 [place] Catalyst guide catheter, and also the exchange micro guidewire demonstrated continued excellent apposition and patency of the angioplastied segment of the left middle  cerebral artery with no intra stent abnormalities. A distal perfusion was now noted into the M4 M5 regions. Moderate spasm at the middle cervical left ICA responded to 25 mcg of nitroglycerin intra-arterially. The left anterior cerebral artery remained widely patent with cross filling of the right anterior cerebral A2  segment as described earlier. Balloon guide was removed. The Pinnacle sheath was removed with successful hemostasis with an 8 French Angio-Seal closure device. Distal pulses remained Dopplerable in both feet unchanged. Patient was left intubated to protect her airway as per anesthesia. An immediate CT scan of the brain performed on the table demonstrated contrast stain in the left basal ganglia region, and also the left parietal subcortical area. Patient was loaded with aspirin 81 mg, and Brilinta 180 mg p.o. via an orogastric tube just prior to the balloon angioplasty. A loading dose of cangrelor IV was given right after placement of the stent, with a 4 hr low-dose infusion with a CT of the brain to follow. The patient's pupils were approximately 2 mm bilaterally though sluggish. The patient was then transferred to the PACU and then neuro ICU for post revascularization treatment. IMPRESSION: Status post endovascular complete revascularization of occluded left middle cerebral artery M1 segment with 1 pass with the Tiger 17 retrieval device and proximal aspiration followed by placement of a 2.25 mm x 12 mm Synergy balloon mounted drug-eluting stent with achievement of a TICI 3 revascularization. PLAN: Follow-up in the clinic approximately 4 weeks post discharge. Electronically Signed   By: Luanne Bras M.D.   On: 07/01/2020 12:48   CT HEAD CODE STROKE WO CONTRAST  Result Date: 06/30/2020 CLINICAL DATA:  Code stroke. 65 year old female with right side weakness, nonverbal. EXAM: CT HEAD WITHOUT CONTRAST TECHNIQUE: Contiguous axial images were obtained from the base of the skull through the  vertex without intravenous contrast. COMPARISON:  [facility] brain MRI 02/15/2016 FINDINGS: Brain: No ventriculomegaly. No midline shift, mass effect, or evidence of intracranial mass lesion. No acute intracranial hemorrhage identified. Asymmetric left MCA territory indistinct gray and white matter hypodensity compatible with cytotoxic edema (series 2, image 15, 13). Left insula, left M2 and M3 segments affected. Left lentiform also asymmetric on series 2, image 16. Contralateral right hemisphere and posterior fossa gray-white matter differentiation is preserved. There is a small chronic appearing right cerebellar infarct which is new from 2017 (series 2, image 10). No acute intracranial hemorrhage identified. Vascular: Mild Calcified atherosclerosis at the skull base. No suspicious intracranial vascular hyperdensity. Skull: Negative. Sinuses/Orbits: 6 Visualized paranasal sinuses and mastoids are clear. Other: Leftward gaze deviation. Visualized scalp soft tissues are within normal limits. ASPECTS [facility] Stroke Program Early CT Score) - Ganglionic level infarction (caudate, lentiform nuclei, internal capsule, insula, M1-M3 cortex): 3 - Supraganglionic infarction (M4-M6 cortex): 3 Total score (0-10 with 10 being normal): 6 IMPRESSION: 1. Acute Left MCA territory infarct. ASPECTS 6. No hemorrhage or mass effect at this time. 2. The above discussed by telephone with Dr. Curly Shores on 06/30/2020 at 07:32 . 3. 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 Electronically Signed   By: Genevie Ann M.D.   On: 06/30/2020 07:34   VAS [place] LOWER EXTREMITY VENOUS (DVT)  Result Date: 07/01/2020  Lower Venous DVT Study Other Indications: Embolic stroke. Risk Factors: None identified. Comparison Study: No previous exam Performing Technologist: Vonzell Schlatter RVT  Examination Guidelines: A complete evaluation includes B-mode imaging, spectral Doppler, color Doppler, and power  Doppler as needed of all accessible portions of each vessel. Bilateral testing is considered an integral part of a complete examination. Limited examinations for reoccurring indications may be performed as noted. The reflux portion of the exam is performed with the patient in reverse Trendelenburg.  +---------+---------------+---------+-----------+----------+--------------+ RIGHT    CompressibilityPhasicitySpontaneityPropertiesThrombus Aging +---------+---------------+---------+-----------+----------+--------------+  CFV      Full           Yes      Yes                                 +---------+---------------+---------+-----------+----------+--------------+ SFJ      Full                                                        +---------+---------------+---------+-----------+----------+--------------+ FV Prox  Full                                                        +---------+---------------+---------+-----------+----------+--------------+ FV Mid   Full                                                        +---------+---------------+---------+-----------+----------+--------------+ FV DistalFull                                                        +---------+---------------+---------+-----------+----------+--------------+ PFV      Full                                                        +---------+---------------+---------+-----------+----------+--------------+ POP      Full           Yes      Yes                                 +---------+---------------+---------+-----------+----------+--------------+ PTV      Full                                                        +---------+---------------+---------+-----------+----------+--------------+ PERO     Full                                                        +---------+---------------+---------+-----------+----------+--------------+    +---------+---------------+---------+-----------+----------+--------------+ LEFT     CompressibilityPhasicitySpontaneityPropertiesThrombus Aging +---------+---------------+---------+-----------+----------+--------------+ CFV      Full           Yes      Yes                                 +---------+---------------+---------+-----------+----------+--------------+  SFJ      Full                                                        +---------+---------------+---------+-----------+----------+--------------+ FV Prox  Full                                                        +---------+---------------+---------+-----------+----------+--------------+ FV Mid   Full                                                        +---------+---------------+---------+-----------+----------+--------------+ FV DistalFull                                                        +---------+---------------+---------+-----------+----------+--------------+ PFV      Full                                                        +---------+---------------+---------+-----------+----------+--------------+ POP      Full           Yes      Yes                                 +---------+---------------+---------+-----------+----------+--------------+ PTV      Full                                                        +---------+---------------+---------+-----------+----------+--------------+ PERO     Full                                                        +---------+---------------+---------+-----------+----------+--------------+     Summary: BILATERAL: - No evidence of deep vein thrombosis seen in the lower extremities, bilaterally. -No evidence of popliteal cyst, bilaterally. RIGHT: - Ultrasound characteristics of a ruptured Baker's Cyst are noted.   *See table(s) above for measurements and observations. Electronically signed by Heath Larkhomas Hawken on 07/01/2020 at 5:16:40 PM.     Final     PHYSICAL EXAM  GENERAL: Pleasant elderly African-American lady not in distress    HEENT: Unremarkable  EXTREMITIES: No edema   BACK: Normal  SKIN: Normal by inspection.    MENTAL STATUS: She is awake and alert.   she has clear severe global aphasia.  Severe  expressive aphasia with only a few guttural sounds.  She does focuses and tracks however.  She follows simple midline and one-step commands with gestures.  She makes some guttural noises but no audible words or sentences CRANIAL NERVES: Pupils are equal, round and reactive to light and accomodation; extra ocular movements are full, there is no significant nystagmus; visual fields -shows a right homonymous hemianopia; upper and lower facial muscles are normal in strength and symmetric, there is no flattening of the nasolabial folds; tongue is midline; uvula is midline; shoulder elevation is normal.  MOTOR: Right upper extremity 2/5, right lower extremity 3/5.  Left side 4/5.  COORDINATION: No dysmetria or tremors noted.  SENSATION: Reduced sensation to pain on the right side.        ASSESSMENT/PLAN Courtney Burke is a 65 y.o. female with history of hypertension, diabetes, CKD, and obesity status post gastric bypass in 05/2020 admitted for confusion, seizure-like activity, drooling, bowel incontinence, and left gaze and right-sided weakness. No tPA given due to outside window.  Status post IR for left MCA occlusion.  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  Aspects 6  CT head and neck left MCA origin occlusion with some left MCA branch reconstitution.  Severe stenosis right ICA origin, moderate stenosis right M2 origin, mild stenosis right M1 and VB junction  MRI / MRA - Acute infarct left MCA territory involving the basal  ganglia as well as the left temporal, frontal, and parietal lobes. Small area of hemorrhage in the left posterior temporal lobe. Additional small areas of acute infarct in the occipital white matter bilaterally and right cerebellum. Left MCA stent appears patent. Moderate intracranial atherosclerotic disease.  CT Abdomen and Pelvis - small retroperitoneal hematoma.  2D Echo EF 60 to 65%  LE venous Doppler Negative for DVT  Recommend loop recorder to rule out A. fib on discharge  LDL 68  HgbA1c 5.4  Lovenox for VTE prophylaxis  aspirin 81 mg daily prior to admission, now on aspirin 81 mg daily and Brilinta (ticagrelor) 90 mg bid.   Ongoing aggressive stroke risk factor management  Therapy recommendations: CIR - Rehab MD accepted patient for possible inpt rehab admission.  Disposition: Pending  Seizure like activity  At home with eyes rolling back, unresponsiveness, bowel incontinence, moaning and drooling  EEG Intermittent rhythmic delta activity in left frontal region is on the ictal-interictal continuum with low to intermediate potential for seizures.  Continue Keppra 500 twice daily  Diabetes  HgbA1c 5.4 goal < 7.0  Controlled  CBG monitoring  SSI  DM education and close PCP follow up  Hypertension . Stable (no scheduled BP meds at this time) . BP 120-140 within 24 h of IR  Long term BP goal normotensive  Hyperlipidemia  Home meds: Lipitor 40  LDL 68, goal < 70  Now on Lipitor 80  Continue statin at discharge  Dysphagia  Patient is on puree diet  The patient is asked to make an attempt to improve diet and exercise patterns to aid in medical management of this problem.  Keep n.p.o.  Status post cortrak feeding tube replacement 1/31  IV fluid and tube feeding  Other Stroke Risk Factors  Advanced age  Obesity, Body mass index is 37.54 kg/m.  Status post gastric bypass in 05/2020  Other Active Problems  CKD creatinine 1.3-> 1.44 - IV  fluid and tube feeding - 1.47->1.26->1.16  NPO - tube feeding - free water 200 ml Q 4 hours  Hypernatremia - Na - 146 - free water 200 ml Q 4 hours added 07/03/20 per CCM. Today Na continues to trend down to 143.  Intubated -> extubated 07/01/20  Anemia - Hgb - 11.2->9.9->9.1->7.9->8.2->8.5->8.2 - Check stool for Hemoccult; consider transfusion if drops below 7. Occult blood card - negative x 1 ; CT Abdomen and Pelvis - small retroperitoneal hematoma.  Small area of hemorrhage in the left posterior temporal lobe by MRI 07/01/20. (consider CT scan if neuro changes).  Right now the benefit of dual antiplatelet agents outweigh the risk. Close follow-up is warranted however.     Hospital day # 5 Continue ongoing therapy.  Encourage oral diet since she is passed.  If she is eating adequately may DC for track tube transfer to rehab in the next few days when bed available.  Continue aspirin and Brilinta for intracranial stent and Keppra for seizure prophylaxis.  Aggressive risk factor modification Stroke team  will sign off.  Kindly call for questions.  Discussed with Dr. Dallas Schimke, MD  To contact Stroke Continuity provider, please refer to [email or web address] After hours, contact General Neurology

## 2020-07-05 NOTE — Progress Notes (Signed)
Physical Therapy Treatment Patient Details Name: Courtney Burke MRN: [id number] DOB: [birth year] Today's Date: 07/05/2020    History of Present Illness 65 y.o. female with a PMHx of HTN, DM, CKD III, asthma, Vit D deficiency, obesity s/p gastric sleeve 12/21 who [facility] via [facility] as a CODE STROKE with AMS and aphasia. Pt's brother also reports multiple periods of LOC. In ED pt demonstrates L gaze and R hemiparesis. CTA demonstrates L M1 occlusion. Pt underwent L common carotid arteriogram with revascularization of L MCA on 06/30/2020.    PT Comments    Pt seen by PT/OT together today to maximize function. Pt very flat throughout treatment today but did make eye contact when cued and followed basic 1 step commands with increased time. Pt required min A to come to sitting EOB with education on using LLE to assist R off bed. Pt ambulated 8' with B HHA and attempted to performed standing activities at sink but fatigued quickly in standing so performed in sitting. After rest, pt able to ambulate again with use of RW and mod A with R foot lag. Continue to recommend CIR at d/c. PT will continue to follow.    Follow Up Recommendations  CIR     Equipment Recommendations   (defer to post acute)    Recommendations for Other Services Rehab consult     Precautions / Restrictions Precautions Precautions: Fall Restrictions Weight Bearing Restrictions: No    Mobility  Bed Mobility Overal bed mobility: Needs Assistance Bed Mobility: Rolling;Sidelying to Sit Rolling: Mod assist Sidelying to sit: Mod assist       General bed mobility comments: pt needs tactile cues to initiate mvmt. L foot placed under R to encourage self assist. Min A to trunk to come SL to sit  Transfers Overall transfer level: Needs assistance Equipment used: 2 person hand held assist Transfers: Sit to/from Stand Sit to Stand: Min assist;+2 physical assistance;Mod assist         General transfer comment: tactile  cues to wait for environment to be ready, mod A to power up, min A to steady  Ambulation/Gait Ambulation/Gait assistance: Min assist;+2 physical assistance Gait Distance (Feet): 8 Feet (2x) Assistive device: 2 person hand held assist;Rolling walker (2 wheeled) Gait Pattern/deviations: Step-to pattern;Decreased step length - right;Decreased weight shift to right;Wide base of support Gait velocity: decreased Gait velocity interpretation: <1.31 ft/sec, indicative of household ambulator General Gait Details: poor clearance of RLE with shortened RLE stride resulting in RLE lag with gait despite cues. Second bout of walking used RW but pt keeping it too far ahead and RLE continued to lag behind. Pt fatigues quickly with ambulation. Could not maintain standing at sink for standing tasks after walking. Performed tasks seated and then walked again   [employer]    Modified Rankin (Stroke Patients Only) Modified Rankin (Stroke Patients Only) Pre-Morbid Rankin Score: No symptoms Modified Rankin: Moderately severe disability     Balance Overall balance assessment: Needs assistance Sitting-balance support: Single extremity supported;Bilateral upper extremity supported;Feet unsupported Sitting balance-Leahy Scale: Fair Sitting balance - Comments: supervsion for safety EOB   Standing balance support: Bilateral upper extremity supported Standing balance-Leahy Scale: Poor Standing balance comment: reliant on UE and external support                            Cognition Arousal/Alertness: Awake/alert Behavior During Therapy:  Flat affect Overall Cognitive Status: Difficult to assess Area of Impairment: Following commands;Problem solving                       Following Commands: Follows one step commands inconsistently     Problem Solving: Slow processing;Requires verbal cues;Requires tactile cues General Comments: pt needing extra time for  processing. Difficult to assess due to aphasia and pt very flat today      Exercises      General Comments General comments (skin integrity, edema, etc.): VSS on RA      Pertinent Vitals/Pain Pain Assessment: Faces Faces Pain Scale: No hurt Pain Location: RUE with painful timuli Pain Descriptors / Indicators: Grimacing Pain Intervention(s): Monitored during session;Limited activity within patient's tolerance;Repositioned    Home Living                      Prior Function            PT Goals (current goals can now be found in the care plan section) Acute Rehab PT Goals Patient Stated Goal: unable to state PT Goal Formulation: Patient unable to participate in goal setting Time For Goal Achievement: 07/15/20 Potential to Achieve Goals: Good Progress towards PT goals: Progressing toward goals    Frequency    Min 4X/week      PT Plan Current plan remains appropriate    Co-evaluation PT/OT/SLP Co-Evaluation/Treatment: Yes Reason for Co-Treatment: Complexity of the patient's impairments (multi-system involvement);Necessary to address cognition/behavior during functional activity;For patient/therapist safety;To address functional/ADL transfers PT goals addressed during session: Mobility/safety with mobility;Balance;Proper use of DME OT goals addressed during session: ADL's and self-care      AM-PAC PT "6 Clicks" Mobility   Outcome Measure  Help needed turning from your back to your side while in a flat bed without using bedrails?: A Little Help needed moving from lying on your back to sitting on the side of a flat bed without using bedrails?: A Little Help needed moving to and from a bed to a chair (including a wheelchair)?: A Lot Help needed standing up from a chair using your arms (e.g., wheelchair or bedside chair)?: A Lot Help needed to walk in hospital room?: A Lot Help needed climbing 3-5 steps with a railing? : Total 6 Click Score: 13    End of  Session Equipment Utilized During Treatment: Gait belt Activity Tolerance: Patient tolerated treatment well Patient left: in chair;with call bell/phone within reach;with chair alarm set Nurse Communication: Mobility status PT Visit Diagnosis: Other abnormalities of gait and mobility (R26.89);Muscle weakness (generalized) (M62.81);Other symptoms and signs involving the nervous system (R29.898)     Time: 1017-5102 PT Time Calculation (min) (ACUTE ONLY): 26 min  Charges:  $Gait Training: 8-22 mins                     Leighton Roach, [place]  Pager [phone] Office [place] 07/05/2020, 2:20 PM

## 2020-07-05 NOTE — Progress Notes (Signed)
Nutrition Follow-up  DOCUMENTATION CODES:   Obesity unspecified  INTERVENTION:   -Magic Cup TID, each supplement provides 290 kcal, 9 g protein  -Ensure Enlive BID, each supplement provides 350 kcal, 20 g protein  NUTRITION DIAGNOSIS:   Increased nutrient needs related to dysphagia as evidenced by estimated needs.  GOAL:   Patient will meet greater than or equal to 90% of their needs  MONITOR:   PO intake,Supplement acceptance,Labs,Weight trends,I & O's,Skin  REASON FOR ASSESSMENT:   Consult Enteral/tube feeding initiation and management  ASSESSMENT:   Pt with PMH of HTN, DM, CKD III, vitamin D deficiency, obesity s/p gastric sleeve 12/21 admitted with L MCA s/p revascularization of occluded L MCA with stent.  Notes indicate that pt consumed 40% of breakfast on 02/01. Intern checked pt's lunch tray and noted approximately 35% of food consumed. [name] indicated that pt has had a decent appetite since her diet advancement. She also mentioned that pt has not had any difficulty swallowing since her diet advancement.  Pt still has Cortrak in but no TF are infusing. Will monitor PO intake for a few days and if intake does not improve, may have to consider restarting tube feeds.   [name] denied that pt had been having any n/v or diarrhea during hospital admission. [name] discussed with intern that pt does occasionally have stomach pain when readjusting herself on the bed.   [name] discussed with intern that her mother has previously taken ONS and she mentioned that she thought it was Brewing technologist. She said her mother was accepting of these. She thought pt would be open to having ONS during this hospital stay to aid in her healing process.   [name] mentioned that she felt pt had recently lost weight since her gastric sleeve procedure. Pt's noted weight trends indicate a steady weight since admission on 06/30/20.  1/27 s/p thrombectomy and  stent placement  1/28 extubated; failed swallow; cortrak placed tip gastric per radiologist but xray does not mention her recent vertical sleeve gastrectomy.  1/30 pt pulled out Cortrak tube 1/31 cortrak replaced; post pyloric; SLP performed MBS and recommends a Dysphagia 2 diet (fine chop); Thin liquids.   Meds Reviewed: Oschal with D (500-200 Mg, 1 tablet, TID), Colace (50 mg/5 mL liquid 100 mg, BID), ProSourceTF (45 mL, BID), Free Water (200 mL, 6x/day), MVI (BID), Miralax (17 g, daily), Senekot-S (1 tablet, daily)  Labs Reviewed: Creatinine (1.20 mg/dL), GFR (50 mL/min)  Diet Order:   Diet Order            DIET DYS 2 Room service appropriate? No; Fluid consistency: Thin  Diet effective now                 EDUCATION NEEDS:   No education needs have been identified at this time  Skin:  Skin Assessment: Reviewed RN Assessment  Last BM:  01/31 (type 6)  Height:   Ht Readings from Last 1 Encounters:  06/30/20 5\' 2"  (1.575 m)    Weight:   Wt Readings from Last 1 Encounters:  07/04/20 93.1 kg    Ideal Body Weight:  50 kg  BMI:  Body mass index is 37.54 kg/m.  Estimated Nutritional Needs:   Kcal:  1600-1800  Protein:  90-110 grams  Fluid:  > 1.6 L/day    Salvadore Oxford, Dietetic Intern 07/05/2020 3:44 PM

## 2020-07-06 DIAGNOSIS — D509 Iron deficiency anemia, unspecified: Secondary | ICD-10-CM

## 2020-07-06 DIAGNOSIS — R338 Other retention of urine: Secondary | ICD-10-CM | POA: Diagnosis not present

## 2020-07-06 DIAGNOSIS — I63512 Cerebral infarction due to unspecified occlusion or stenosis of left middle cerebral artery: Secondary | ICD-10-CM | POA: Diagnosis not present

## 2020-07-06 DIAGNOSIS — I10 Essential (primary) hypertension: Secondary | ICD-10-CM | POA: Diagnosis not present

## 2020-07-06 DIAGNOSIS — N1831 Chronic kidney disease, stage 3a: Secondary | ICD-10-CM | POA: Diagnosis not present

## 2020-07-06 LAB — BASIC METABOLIC PANEL WITH GFR
Anion gap: 10 (ref 5–15)
BUN: 13 mg/dL (ref 8–23)
CO2: 22 mmol/L (ref 22–32)
Calcium: 8.7 mg/dL — ABNORMAL LOW (ref 8.9–10.3)
Chloride: 107 mmol/L (ref 98–111)
Creatinine, Ser: 1.21 mg/dL — ABNORMAL HIGH (ref 0.44–1.00)
GFR, Estimated: 50 mL/min — ABNORMAL LOW
Glucose, Bld: 137 mg/dL — ABNORMAL HIGH (ref 70–99)
Potassium: 3.3 mmol/L — ABNORMAL LOW (ref 3.5–5.1)
Sodium: 139 mmol/L (ref 135–145)

## 2020-07-06 LAB — RETICULOCYTES
Immature Retic Fract: 27.6 % — ABNORMAL HIGH (ref 2.3–15.9)
RBC.: 3.07 MIL/uL — ABNORMAL LOW (ref 3.87–5.11)
Retic Count, Absolute: 92.4 10*3/uL (ref 19.0–186.0)
Retic Ct Pct: 3 % (ref 0.4–3.1)

## 2020-07-06 LAB — CBC
HCT: 27.3 % — ABNORMAL LOW (ref 36.0–46.0)
Hemoglobin: 8.9 g/dL — ABNORMAL LOW (ref 12.0–15.0)
MCH: 28.1 pg (ref 26.0–34.0)
MCHC: 32.6 g/dL (ref 30.0–36.0)
MCV: 86.1 fL (ref 80.0–100.0)
Platelets: 523 10*3/uL — ABNORMAL HIGH (ref 150–400)
RBC: 3.17 MIL/uL — ABNORMAL LOW (ref 3.87–5.11)
RDW: 13.2 % (ref 11.5–15.5)
WBC: 7.6 10*3/uL (ref 4.0–10.5)
nRBC: 0 % (ref 0.0–0.2)

## 2020-07-06 LAB — IRON AND TIBC
Iron: 26 ug/dL — ABNORMAL LOW (ref 28–170)
Saturation Ratios: 10 % — ABNORMAL LOW (ref 10.4–31.8)
TIBC: 266 ug/dL (ref 250–450)
UIBC: 240 ug/dL

## 2020-07-06 LAB — FOLATE: Folate: 6.4 ng/mL (ref >5.9)

## 2020-07-06 LAB — FERRITIN: Ferritin: 59 ng/mL (ref 11–307)

## 2020-07-06 LAB — VITAMIN B12: Vitamin B-12: 243 pg/mL (ref 180–914)

## 2020-07-06 MED ORDER — VITAMIN D 25 MCG (1000 UNIT) PO TABS
1000.0000 [IU] | ORAL_TABLET | Freq: Every day | ORAL | Status: DC
  Administered 2020-07-07 – 2020-07-09 (×3): 1000 [IU]
  Filled 2020-07-06 (×3): qty 1

## 2020-07-06 MED ORDER — SODIUM CHLORIDE 0.9 % IV SOLN: 25.0000 mg | Freq: Once | INTRAVENOUS | Status: DC

## 2020-07-06 MED ORDER — SODIUM CHLORIDE 0.9 % IV SOLN
510.0000 mg | Freq: Once | INTRAVENOUS | Status: AC
  Administered 2020-07-06: 510 mg via INTRAVENOUS
  Filled 2020-07-06: qty 17

## 2020-07-06 MED ORDER — POTASSIUM CHLORIDE 20 MEQ PO PACK
40.0000 meq | PACK | ORAL | Status: AC
Start: 2020-07-06 — End: 2020-07-06
  Administered 2020-07-06 (×2): 40 meq
  Filled 2020-07-06 (×2): qty 2

## 2020-07-06 MED ORDER — CALCIUM CITRATE 950 (200 CA) MG PO TABS
200.0000 mg | ORAL_TABLET | Freq: Three times a day (TID) | ORAL | Status: DC
  Administered 2020-07-06 – 2020-07-09 (×9): 200 mg
  Filled 2020-07-06 (×11): qty 1

## 2020-07-06 NOTE — Care Management Important Message (Signed)
Important Message  Patient Details  Name: Courtney Burke MRN: [id number] Date of Birth: 1955-07-06   Medicare Important Message Given:  Yes     Toya Palacios [address] 07/06/2020, 11:41 AM

## 2020-07-06 NOTE — H&P (Addendum)
Physical Medicine and Rehabilitation Admission H&P    Chief Complaint  Patient presents with  . Code Stroke  : HPI: Courtney Burke is a 65 year old right-handed female history of hypertension diet-controlled diabetes mellitus CKD stage III asthma vitamin D deficiency obesity status post gastric sleeve 05/24/2020.  History taken from chart review due to cognition.  She presented on 06/30/2020 with AMS and aphasia.  Head CT showing acute left MCA territory infarct.  No hemorrhage or mass-effect.  Patient did not receive TPA.  EEG negative for seizures.  CT angiogram of head and neck positive for emergent large vessel occlusion.  Left MCA origin.  Some left MCA branch reconstitution underwent MCA stenting per interventional radiology.  Follow-up MRI showed acute infarct left MCA territory involving the basal ganglia as well as left temporal frontal parietal lobes.  Small amount of hemorrhage in the left posterior temporal lobe.  Additional small areas of acute infarct in the occipital white matter bilaterally and right cerebellum.  Echocardiogram with ejection fraction of 60-65%.  No wall motion abnormalities grade 1 diastolic dysfunction.  Currently maintained on aspirin 81 mg daily as well as Brilinta for CVA prophylaxis.  Patient would follow-up outpatient with cardiology services to discuss loop recorder placement.  Subcutaneous Lovenox for DVT prophylaxis.  Currently maintained on Keppra for seizure prophylaxis.  Hospital course further complicated by post stroke dysphagia.  Currently on dysphagia #2 nectar thick liquid diet as well as alternative means of nutritional support.  Bouts of urinary retention placed on Urecholine.  Due to patient's decreased functional mobility and global aphasia she was admitted for a comprehensive rehab program.  Please see preadmission assessment from earlier today as well.  Review of Systems  Unable to perform ROS: Mental acuity   Past Medical History:  Diagnosis  Date  . Asthma   . Borderline diabetes   . Hypertension    Past Surgical History:  Procedure Laterality Date  . ANKLE SURGERY    . CARPAL TUNNEL RELEASE    . carpel tunnel    . IR CT HEAD LTD  06/30/2020  . IR INTRA CRAN STENT  06/30/2020  . IR PERCUTANEOUS ART THROMBECTOMY/INFUSION INTRACRANIAL INC DIAG ANGIO  06/30/2020  . RADIOLOGY WITH ANESTHESIA N/A 06/30/2020   Procedure: RADIOLOGY WITH ANESTHESIA;  Surgeon: Radiologist, Medication, MD;  Location: [facility];  Service: Radiology;  Laterality: N/A;   History reviewed. No pertinent family history.  Unable to obtain from patient. Social History:  reports that she has never smoked. She has never used smokeless tobacco. She reports that she does not drink alcohol and does not use drugs. Allergies:  Allergies  Allergen Reactions  . Iodinated Diagnostic Agents Itching  . Doxycycline Nausea And Vomiting  . Sulfa Antibiotics Rash and Hives  . Codeine Hives and Swelling    Swollen tongue  . Hydrocodone-Homatropine Itching  . Ace Inhibitors Cough, Itching and Rash   Medications Prior to Admission  Medication Sig Dispense Refill  . acetaminophen (TYLENOL) 325 MG tablet Take 2 tablets (650 mg total) by mouth every 6 (six) hours as needed for mild pain, fever or headache (or temp > 37.5 C (99.5 F)).    Marland Kitchen albuterol (PROVENTIL) (2.5 MG/3ML) 0.083% nebulizer solution Take 3 mLs (2.5 mg total) by nebulization every 6 (six) hours as needed for wheezing or shortness of breath. 75 mL 12  . aspirin 81 MG chewable tablet Chew 1 tablet (81 mg total) by mouth daily.    Marland Kitchen atorvastatin (LIPITOR) 80 MG  tablet Take 1 tablet (80 mg total) by mouth at bedtime.    . bethanechol (URECHOLINE) 10 MG tablet Take 1 tablet (10 mg total) by mouth 3 (three) times daily.    . calcium citrate (CALCITRATE - DOSED IN MG ELEMENTAL CALCIUM) 950 (200 Ca) MG tablet Take 1 tablet (200 mg of elemental calcium total) by mouth 3 (three) times daily.    . chlorhexidine gluconate,  MEDLINE KIT, (PERIDEX) 0.12 % solution 15 mLs by Mouth Rinse route 2 (two) times daily. 120 mL 0  . cholecalciferol (VITAMIN D) 25 MCG tablet Take 1 tablet (1,000 Units total) by mouth daily before breakfast.    . feeding supplement (ENSURE ENLIVE / ENSURE PLUS) LIQD Take 237 mLs by mouth 2 (two) times daily between meals. 237 mL 12  . levETIRAcetam (KEPPRA) 500 MG tablet Take 1 tablet (500 mg total) by mouth 2 (two) times daily.    . Multiple Vitamin (MULTIVITAMIN WITH MINERALS) TABS tablet Take 1 tablet by mouth 2 (two) times daily.    . Nutritional Supplements (FEEDING SUPPLEMENT, OSMOLITE 1.2 CAL,) LIQD Place 1,000 mLs into feeding tube continuous.  0  . Nutritional Supplements (FEEDING SUPPLEMENT, PROSOURCE TF,) liquid Place 45 mLs into feeding tube 2 (two) times daily.    . pantoprazole (PROTONIX) 40 MG tablet Take 1 tablet (40 mg total) by mouth daily. 90 tablet 0  . polyethylene glycol powder (MIRALAX) 17 GM/SCOOP powder Take 17 g by mouth 2 (two) times daily as needed for moderate constipation. 255 g 0  . senna-docusate (SENOKOT-S) 8.6-50 MG tablet Take 1 tablet by mouth 2 (two) times daily between meals as needed for mild constipation. 60 tablet 0  . ticagrelor (BRILINTA) 90 MG TABS tablet Take 1 tablet (90 mg total) by mouth 2 (two) times daily. 180 tablet 1  . Water For Irrigation, Sterile (FREE WATER) SOLN Place 200 mLs into feeding tube every 4 (four) hours.      Drug Regimen Review Drug regimen was reviewed and remains appropriate with no significant issues identified  Home: Home Living Family/patient expects to be discharged to:: Unsure Living Arrangements: Children,Other relatives Available Help at Discharge: Family,Friend(s) Type of Home: House Home Access: Ramped entrance Home Layout: Two level,Able to live on main level with bedroom/bathroom Bathroom Shower/Tub: Chiropodist: Standard Bathroom Accessibility: Yes Additional Comments: pt with  expressive and receptive aphasia, unable to report history, no family present at the time of evaluation  Lives With: Daughter,Other (Comment)   Functional History: Prior Function Comments: unable to determine as no family present and pt is aphasic. Based on documentation it seems the pt was able to drive her brothers car so PT assumes the pt is fairly independent.  Functional Status:  Mobility: Bed Mobility Overal bed mobility: Needs Assistance Bed Mobility: Supine to Sit Rolling: Mod assist Sidelying to sit: Min guard,HOB elevated General bed mobility comments: increased time and use of bed rails Transfers Overall transfer level: Needs assistance Equipment used: Rolling walker (2 wheeled) Transfers: Sit to/from Stand Sit to Stand: Min assist Stand pivot transfers: Min assist General transfer comment: minA with HHA for initial transfers. Pt with modA later in session possibly due to fatigue Ambulation/Gait Ambulation/Gait assistance: Min assist Gait Distance (Feet): 35 Feet Assistive device: Rolling walker (2 wheeled) Gait Pattern/deviations: Step-to pattern General Gait Details: pt with shortened step-to gait, able to maintain RUE on RW but having difficulty utilizing it to steer, often drifting toward R side Gait velocity: reduced Gait velocity interpretation: <1.31  ft/sec, indicative of household ambulator    ADL: ADL Overall ADL's : Needs assistance/impaired Eating/Feeding: NPO Eating/Feeding Details (indicate cue type and reason): pt noted to be choking on secretions sitting in bed. pt with tearful eyes nose slightly wet and drainage from mouth Grooming: Wash/dry face,Moderate assistance,Sitting Grooming Details (indicate cue type and reason): Pt particiapting in washing her face while seated at sink. Requiring Max simple cues for initating each step to wash face. Processing slowly. Upper Body Bathing: Cueing for sequencing Lower Body Bathing: Cueing for safety Toilet  Transfer: Minimal assistance,+2 for physical assistance,Ambulation (simulated to chair) Toilet Transfer Details (indicate cue type and reason): Min A to power up into standing. Supporting RUE for weight bearing Functional mobility during ADLs: Minimal assistance,Moderate assistance,+2 for safety/equipment,Rolling walker General ADL Comments: Focused on mobility to/from sink and then sequencing ADL at sink. Pt continues to present with decreased balance, strength, inattention, and cognition  Cognition: Cognition Overall Cognitive Status: Difficult to assess Arousal/Alertness: Awake/alert Orientation Level: Other (comment) (UTA) Attention: Sustained Sustained Attention: Impaired Sustained Attention Impairment: Functional basic Memory:  (TBA) Awareness: Impaired Awareness Impairment: Emergent impairment Problem Solving:  (will assess further) Safety/Judgment: Impaired Cognition Arousal/Alertness: Awake/alert Behavior During Therapy: Flat affect Overall Cognitive Status: Difficult to assess Area of Impairment: Following commands,Safety/judgement,Awareness,Problem solving Current Attention Level: Sustained Following Commands: Follows one step commands consistently,Follows multi-step commands inconsistently Safety/Judgement: Decreased awareness of safety Awareness: Emergent Problem Solving: Slow processing,Requires verbal cues,Requires tactile cues General Comments: pt needing extra time for processing. Difficult to assess due to aphasia and pt very flat today Difficult to assess due to: Impaired communication  Physical Exam: Blood pressure 140/63, pulse 89, temperature 97.6 F (36.4 C), temperature source Oral, resp. rate 19, height 5' 2"  (1.575 m), weight 91.3 kg, last menstrual period 09/28/2012, SpO2 97 %. Physical Exam Vitals reviewed.  Constitutional:      General: She is not in acute distress.    Appearance: She is obese.  HENT:     Head: Normocephalic and atraumatic.      Comments: + NG.    Right Ear: External ear normal.     Left Ear: External ear normal.     Nose: Nose normal.  Eyes:     General:        Right eye: No discharge.        Left eye: No discharge.     Extraocular Movements: Extraocular movements intact.  Cardiovascular:     Rate and Rhythm: Normal rate and regular rhythm.  Pulmonary:     Effort: Pulmonary effort is normal. No respiratory distress.     Breath sounds: No stridor.  Abdominal:     General: Abdomen is flat. Bowel sounds are normal. There is no distension.  Musculoskeletal:     Cervical back: Normal range of motion and neck supple.     Comments: No edema or tenderness in extremities  Skin:    General: Skin is warm and dry.  Neurological:     Mental Status: She is alert.     Comments: Alert Global aphasia Motor: Unable to follow commands, spontaneously moving bilateral upper extremities  Psychiatric:     Comments: Unable to assess due to aphasia     Results for orders placed or performed during the hospital encounter of 06/30/20 (from the past 48 hour(s))  Glucose, capillary     Status: None   Collection Time: 07/08/20  8:49 PM  Result Value Ref Range   Glucose-Capillary 84 70 - 99 mg/dL  Comment: Glucose reference range applies only to samples taken after fasting for at least 8 hours.   Comment 1 Notify RN    Comment 2 Document in Chart   Glucose, capillary     Status: None   Collection Time: 07/08/20 11:38 PM  Result Value Ref Range   Glucose-Capillary 83 70 - 99 mg/dL    Comment: Glucose reference range applies only to samples taken after fasting for at least 8 hours.   Comment 1 Notify RN    Comment 2 Document in Chart   Glucose, capillary     Status: None   Collection Time: 07/09/20  3:52 AM  Result Value Ref Range   Glucose-Capillary 93 70 - 99 mg/dL    Comment: Glucose reference range applies only to samples taken after fasting for at least 8 hours.   Comment 1 Notify RN    Comment 2 Document in Chart    Glucose, capillary     Status: None   Collection Time: 07/09/20  7:25 AM  Result Value Ref Range   Glucose-Capillary 84 70 - 99 mg/dL    Comment: Glucose reference range applies only to samples taken after fasting for at least 8 hours.   Comment 1 Notify RN    Comment 2 Document in Chart   Glucose, capillary     Status: Abnormal   Collection Time: 07/09/20 11:23 AM  Result Value Ref Range   Glucose-Capillary 133 (H) 70 - 99 mg/dL    Comment: Glucose reference range applies only to samples taken after fasting for at least 8 hours.   Comment 1 Notify RN    Comment 2 Document in Chart    No results found.     Medical Problem List and Plan: 1.  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   Admit to CIR 2.  Antithrombotics: -DVT/anticoagulation: Lovenox  -antiplatelet therapy: Aspirin 81 mg daily and Brilinta 90 mg twice daily 3. Pain Management: Tylenol as needed 4. Mood: Provide emotional support  -antipsychotic agents: N/A 5. Neuropsych: This patient is not capable of making decisions on her own behalf. 6. Skin/Wound Care: Routine skin checks 7. Fluids/Electrolytes/Nutrition: Routine and outs  CMP ordered for tomorrow. 8.  Poststroke dysphagia:.  Dysphagia #2 nectar liquids. Dietary follow-up  Advance diet as tolerated 9.  Seizure prophylaxis.  Keppra 500 mg twice daily.  EEG negative 10.  Urinary retention: Urecholine 10 mg 3 times daily.  Check PVR 11.  Diet-controlled diabetes mellitus.  Hemoglobin A1c 5.4.  CBGs discontinued  Monitor his increase mobility 12.  Hyperlipidemia: Lipitor 13.  Morbid obesity status post gastric sleeve 05/24/2020.  Dietary follow-up 14.  CKD stage III.  Baseline creatinine 1.41-1.59  CMP ordered for tomorrow.  Lavon Paganini Angiulli, PA-C 07/09/2020  I have personally performed a face to face diagnostic  evaluation, including, but not limited to relevant history and physical exam findings, of this patient and developed relevant assessment and plan.  Additionally, I have reviewed and concur with the physician assistant's documentation above.  Delice Lesch, MD, ABPMR

## 2020-07-06 NOTE — Progress Notes (Signed)
Inpatient Rehab Admissions Coordinator:   I spoke with Pt.'s daughter and confirmed that she wants CIR for Pt. I reviewed financial expectations and obtained necessary consents.   Clemens Catholic, [place], [place] Admissions Coordinator  [phone] ([place]) [phone] (office)

## 2020-07-06 NOTE — Progress Notes (Signed)
Inpatient Rehab Admissions Coordinator:   I received insurance auth for CIR admit for this Pt. But do not currently have a bed for her. I will follow for potential admission pending bed availability.   Clemens Catholic, [place], [place] Admissions Coordinator  [phone] ([place]) [phone] (office)

## 2020-07-06 NOTE — H&P (Incomplete Revision)
Physical Medicine and Rehabilitation Admission H&P    Chief Complaint  Patient presents with  . Code Stroke  : HPI: Courtney Burke is a 65 year old right-handed female history of hypertension diet-controlled diabetes mellitus CKD stage III asthma vitamin D deficiency obesity status post gastric sleeve 05/24/2020.  History taken from chart review due to cognition.  She presented on 06/30/2020 with AMS and aphasia.  Head CT showing acute left MCA territory infarct.  No hemorrhage or mass-effect.  Patient did not receive TPA.  EEG negative for seizures.  CT angiogram of head and neck positive for emergent large vessel occlusion.  Left MCA origin.  Some left MCA branch reconstitution underwent MCA stenting per interventional radiology.  Follow-up MRI showed acute infarct left MCA territory involving the basal ganglia as well as left temporal frontal parietal lobes.  Small amount of hemorrhage in the left posterior temporal lobe.  Additional small areas of acute infarct in the occipital white matter bilaterally and right cerebellum.  Echocardiogram with ejection fraction of 60-65%.  No wall motion abnormalities grade 1 diastolic dysfunction.  Currently maintained on aspirin 81 mg daily as well as Brilinta for CVA prophylaxis.  Patient would follow-up outpatient with cardiology services to discuss loop recorder placement.  Subcutaneous Lovenox for DVT prophylaxis.  Currently maintained on Keppra for seizure prophylaxis.  Hospital course further complicated by post stroke dysphagia.  Currently on dysphagia #2 nectar thick liquid diet as well as alternative means of nutritional support.  Bouts of urinary retention placed on Urecholine.  Due to patient's decreased functional mobility and global aphasia she was admitted for a comprehensive rehab program.  Please see preadmission assessment from earlier today as well.  Review of Systems  Unable to perform ROS: Mental acuity   Past Medical History:  Diagnosis  Date  . Asthma   . Borderline diabetes   . Hypertension    Past Surgical History:  Procedure Laterality Date  . ANKLE SURGERY    . CARPAL TUNNEL RELEASE    . carpel tunnel    . IR CT HEAD LTD  06/30/2020  . IR INTRA CRAN STENT  06/30/2020  . IR PERCUTANEOUS ART THROMBECTOMY/INFUSION INTRACRANIAL INC DIAG ANGIO  06/30/2020  . RADIOLOGY WITH ANESTHESIA N/A 06/30/2020   Procedure: RADIOLOGY WITH ANESTHESIA;  Surgeon: Radiologist, Medication, MD;  Location: [facility];  Service: Radiology;  Laterality: N/A;   History reviewed. No pertinent family history.  Unable to obtain from patient. Social History:  reports that she has never smoked. She has never used smokeless tobacco. She reports that she does not drink alcohol and does not use drugs. Allergies:  Allergies  Allergen Reactions  . Iodinated Diagnostic Agents Itching  . Doxycycline Nausea And Vomiting  . Sulfa Antibiotics Rash and Hives  . Codeine Hives and Swelling    Swollen tongue  . Hydrocodone-Homatropine Itching  . Ace Inhibitors Cough, Itching and Rash   Medications Prior to Admission  Medication Sig Dispense Refill  . acetaminophen (TYLENOL) 500 MG tablet Take 500-1,000 mg by mouth every 6 (six) hours as needed for headache (pain).    . naproxen sodium (ALEVE) 220 MG tablet Take 660-880 mg by mouth 2 (two) times daily as needed (pain/headaches).      Drug Regimen Review Drug regimen was reviewed and remains appropriate with no significant issues identified  Home: Home Living Family/patient expects to be discharged to:: Unsure Living Arrangements: Children,Other relatives Available Help at Discharge: Family,Friend(s) Type of Home: House Home Access: Ramped entrance Home  Layout: Two level,Able to live on main level with bedroom/bathroom Bathroom Shower/Tub: Chiropodist: Standard Bathroom Accessibility: Yes Additional Comments: pt with expressive and receptive aphasia, unable to report history, no  family present at the time of evaluation  Lives With: Daughter,Other (Comment)   Functional History: Prior Function Comments: unable to determine as no family present and pt is aphasic. Based on documentation it seems the pt was able to drive her brothers car so PT assumes the pt is fairly independent.  Functional Status:  Mobility: Bed Mobility Overal bed mobility: Needs Assistance Bed Mobility: Supine to Sit Rolling: Mod assist Sidelying to sit: Min guard,HOB elevated General bed mobility comments: increased time and use of bed rails Transfers Overall transfer level: Needs assistance Equipment used: Rolling walker (2 wheeled) Transfers: Sit to/from Stand Sit to Stand: Min assist Stand pivot transfers: Min assist General transfer comment: minA with HHA for initial transfers. Pt with modA later in session possibly due to fatigue Ambulation/Gait Ambulation/Gait assistance: Min assist Gait Distance (Feet): 35 Feet Assistive device: Rolling walker (2 wheeled) Gait Pattern/deviations: Step-to pattern General Gait Details: pt with shortened step-to gait, able to maintain RUE on RW but having difficulty utilizing it to steer, often drifting toward R side Gait velocity: reduced Gait velocity interpretation: <1.31 ft/sec, indicative of household ambulator    ADL: ADL Overall ADL's : Needs assistance/impaired Eating/Feeding: NPO Eating/Feeding Details (indicate cue type and reason): pt noted to be choking on secretions sitting in bed. pt with tearful eyes nose slightly wet and drainage from mouth Grooming: Wash/dry face,Moderate assistance,Sitting Grooming Details (indicate cue type and reason): Pt particiapting in washing her face while seated at sink. Requiring Max simple cues for initating each step to wash face. Processing slowly. Upper Body Bathing: Cueing for sequencing Lower Body Bathing: Cueing for safety Toilet Transfer: Minimal assistance,+2 for physical  assistance,Ambulation (simulated to chair) Toilet Transfer Details (indicate cue type and reason): Min A to power up into standing. Supporting RUE for weight bearing Functional mobility during ADLs: Minimal assistance,Moderate assistance,+2 for safety/equipment,Rolling walker General ADL Comments: Focused on mobility to/from sink and then sequencing ADL at sink. Pt continues to present with decreased balance, strength, inattention, and cognition  Cognition: Cognition Overall Cognitive Status: Difficult to assess Arousal/Alertness: Awake/alert Orientation Level: Other (comment) (UTA) Attention: Sustained Sustained Attention: Impaired Sustained Attention Impairment: Functional basic Memory:  (TBA) Awareness: Impaired Awareness Impairment: Emergent impairment Problem Solving:  (will assess further) Safety/Judgment: Impaired Cognition Arousal/Alertness: Awake/alert Behavior During Therapy: Flat affect Overall Cognitive Status: Difficult to assess Area of Impairment: Following commands,Safety/judgement,Awareness,Problem solving Current Attention Level: Sustained Following Commands: Follows one step commands consistently,Follows multi-step commands inconsistently Safety/Judgement: Decreased awareness of safety Awareness: Emergent Problem Solving: Slow processing,Requires verbal cues,Requires tactile cues General Comments: pt needing extra time for processing. Difficult to assess due to aphasia and pt very flat today Difficult to assess due to: Impaired communication  Physical Exam: Blood pressure 97/71, pulse 79, temperature (!) 97.3 F (36.3 C), temperature source Oral, resp. rate 18, height 5\' 2"  (1.575 m), weight 91.3 kg, last menstrual period 09/28/2012, SpO2 98 %. Physical Exam Vitals reviewed.  Constitutional:      General: She is not in acute distress.    Appearance: She is obese.  HENT:     Head: Normocephalic and atraumatic.     Comments: + NG.    Right Ear: External ear  normal.     Left Ear: External ear normal.     Nose: Nose normal.  Eyes:  General:        Right eye: No discharge.        Left eye: No discharge.     Extraocular Movements: Extraocular movements intact.  Cardiovascular:     Rate and Rhythm: Normal rate and regular rhythm.  Pulmonary:     Effort: Pulmonary effort is normal. No respiratory distress.     Breath sounds: No stridor.  Abdominal:     General: Abdomen is flat. Bowel sounds are normal. There is no distension.  Musculoskeletal:     Cervical back: Normal range of motion and neck supple.     Comments: No edema or tenderness in extremities  Skin:    General: Skin is warm and dry.  Neurological:     Mental Status: She is alert.     Comments: Alert Global aphasia Motor: Unable to follow commands, spontaneously moving bilateral upper extremities  Psychiatric:     Comments: Unable to assess due to aphasia     Results for orders placed or performed during the hospital encounter of 06/30/20 (from the past 48 hour(s))  Renal function panel     Status: Abnormal   Collection Time: 07/07/20  3:48 AM  Result Value Ref Range   Sodium 136 135 - 145 mmol/L   Potassium 4.3 3.5 - 5.1 mmol/L   Chloride 103 98 - 111 mmol/L   CO2 19 (L) 22 - 32 mmol/L   Glucose, Bld 88 70 - 99 mg/dL    Comment: Glucose reference range applies only to samples taken after fasting for at least 8 hours.   BUN 13 8 - 23 mg/dL   Creatinine, Ser 1.25 (H) 0.44 - 1.00 mg/dL   Calcium 9.3 8.9 - 10.3 mg/dL   Phosphorus 3.6 2.5 - 4.6 mg/dL   Albumin 3.0 (L) 3.5 - 5.0 g/dL   GFR, Estimated 48 (L) >60 mL/min    Comment: (NOTE) Calculated using the CKD-EPI Creatinine Equation (2021)    Anion gap 14 5 - 15    Comment: Performed at [facility] [address], [place], [place] [address]  Magnesium     Status: None   Collection Time: 07/07/20  3:48 AM  Result Value Ref Range   Magnesium 2.1 1.7 - 2.4 mg/dL    Comment: Performed at [facility] [address], [place], [place] [address]  Hemoglobin and hematocrit, blood     Status: Abnormal   Collection Time: 07/07/20  3:48 AM  Result Value Ref Range   Hemoglobin 10.3 (L) 12.0 - 15.0 g/dL   HCT 31.5 (L) 36.0 - 46.0 %    Comment: Performed at [facility], [place] [address], [place], [place] [address]   No results found.     Medical Problem List and Plan: 1.  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   Admit to CIR 2.  Antithrombotics: -DVT/anticoagulation: Lovenox  -antiplatelet therapy: Aspirin 81 mg daily and Brilinta 90 mg twice daily 3. Pain Management: Tylenol as needed 4. Mood: Provide emotional support  -antipsychotic agents: N/A 5. Neuropsych: This patient is not capable of making decisions on her own behalf. 6. Skin/Wound Care: Routine skin checks 7. Fluids/Electrolytes/Nutrition: Routine and outs  CMP ordered for tomorrow. 8.  Poststroke dysphagia:.  Dysphagia #2 nectar liquids. Dietary follow-up  Advance diet as tolerated 9.  Seizure prophylaxis.  Keppra 500 mg  twice daily.  EEG negative 10.  Urinary retention: Urecholine 10 mg 3 times daily.  Check PVR 11.  Diet-controlled diabetes mellitus.  Hemoglobin A1c 5.4.  CBGs discontinued  Monitor his increase mobility 12.  Hyperlipidemia: Lipitor 13.  Morbid obesity status post gastric sleeve 05/24/2020.  Dietary follow-up 14.  CKD stage III.  Baseline creatinine 1.41-1.59  CMP ordered for tomorrow.  Lavon Paganini Angiulli, PA-C 07/08/2020  I have personally performed a face to face diagnostic evaluation, including, but not limited to relevant history and physical exam findings, of this patient and developed relevant assessment and plan.  Additionally, I have reviewed and concur with the physician assistant's documentation above.  Delice Lesch, MD, ABPMR

## 2020-07-06 NOTE — Consult Note (Addendum)
ELECTROPHYSIOLOGY CONSULT NOTE  Patient ID: Courtney Burke MRN: [id number], DOB/AGE: Jan 27, 1956   Admit date: 06/30/2020 Date of Consult: 07/06/2020  Primary Physician: [name], PA-C Primary Cardiologist: none Reason for Consultation: Cryptogenic stroke ; recommendations regarding Implantable Loop Recorder, requested by Dr. Leonie Man  History of Present Illness Courtney Burke was admitted on 06/30/2020 admitted for confusion, seizure-like activity, drooling, bowel incontinence, and left gaze and right-sided weakness No tPA given due to outside window.  Status post IR for left MCA occlusion.  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  she has undergone workup for stroke including echocardiogram and carotid angio.  The patient has been monitored on telemetry which has demonstrated sinus rhythm with no arrhythmias.   Neurology has deferred TEE   Echocardiogram this admission demonstrated   IMPRESSIONS  1. Left ventricular ejection fraction, by estimation, is 60 to 65%. The  left ventricle has normal function. The left ventricle has no regional  wall motion abnormalities. There is moderate left ventricular hypertrophy.  Left ventricular diastolic  parameters are consistent with Grade I diastolic dysfunction (impaired  relaxation).   2. Right ventricular systolic function is normal. The right ventricular  size is normal. There is normal pulmonary artery systolic pressure. The  estimated right ventricular systolic pressure is 55.9 mmHg.   3. The mitral valve is grossly normal. Trivial mitral valve  regurgitation.   4. The aortic valve is tricuspid. Aortic valve regurgitation is not  visualized.   5. The inferior vena cava is normal in size with greater than 50%  respiratory variability, suggesting  right atrial pressure of 3 mmHg.   Comparison(s): No prior Echocardiogram.    Lab work is reviewed. K+ 3.3, H/H 8.9/27.3, both deferred to medicine team  Unable to obtain prior to admission symptoms.  She has been accepted to [facility], pending bed placement   Past Medical History:  Diagnosis Date   Asthma    Borderline diabetes    Hypertension      Surgical History:  Past Surgical History:  Procedure Laterality Date   ANKLE SURGERY     CARPAL TUNNEL RELEASE     carpel tunnel     IR CT HEAD LTD  06/30/2020   IR INTRA CRAN STENT  06/30/2020   IR PERCUTANEOUS ART THROMBECTOMY/INFUSION INTRACRANIAL INC DIAG ANGIO  06/30/2020   RADIOLOGY WITH ANESTHESIA N/A 06/30/2020   Procedure: RADIOLOGY WITH ANESTHESIA;  Surgeon: Radiologist, Medication, MD;  Location: [place];  Service: Radiology;  Laterality: N/A;     Medications Prior to Admission  Medication Sig Dispense Refill Last Dose   acetaminophen (TYLENOL) 500 MG tablet Take 500-1,000 mg by mouth every 6 (six) hours as needed for headache (pain).   Past Month at Unknown time   naproxen sodium (ALEVE) 220 MG tablet Take 660-880 mg by mouth 2 (two) times daily as needed (pain/headaches).   Past Month at Unknown time    Inpatient Medications:   aspirin  81 mg Oral Daily   Or   aspirin  81 mg Per Tube Daily   atorvastatin  80 mg Per Tube QHS   bethanechol  25 mg Per Tube TID   calcium citrate  200 mg of elemental calcium Oral TID   chlorhexidine gluconate (MEDLINE KIT)  15 mL Mouth Rinse BID   Chlorhexidine Gluconate Cloth  6 each Topical Daily   cholecalciferol  1,000 Units Oral QAC breakfast   docusate  100 mg Per Tube BID   enoxaparin (LOVENOX) injection  40 mg Subcutaneous Q24H   feeding supplement  237 mL Oral BID BM   feeding supplement (PROSource TF)  45 mL Per Tube BID   free water  200 mL Per Tube Q4H   levETIRAcetam  500 mg Per Tube BID   multivitamin with minerals  1 tablet Per Tube BID   pantoprazole sodium  40 mg Per Tube  Daily   polyethylene glycol  17 g Per Tube Daily   senna-docusate  1 tablet Per Tube QHS   sodium chloride flush  3 mL Intravenous Once   ticagrelor  90 mg Oral BID   Or   ticagrelor  90 mg Per Tube BID    Allergies:  Allergies  Allergen Reactions   Iodinated Diagnostic Agents Itching   Doxycycline Nausea And Vomiting   Sulfa Antibiotics Rash and Hives   Codeine Hives and Swelling    Swollen tongue   Hydrocodone-Homatropine Itching   Ace Inhibitors Cough, Itching and Rash    Social History   Socioeconomic History   Marital status: Divorced    Spouse name: Not on file   Number of children: Not on file   Years of education: Not on file   Highest education level: Not on file  Occupational History   Not on file  Tobacco Use   Smoking status: Never Smoker   Smokeless tobacco: Never Used  Vaping Use   Vaping Use: Never used  Substance and Sexual Activity   Alcohol use: No   Drug use: No   Sexual activity: Not on file  Other Topics Concern   Not on file  Social History Narrative   Not on file   Social Determinants of Health   Financial Resource Strain: Not on file  Food Insecurity: Not on file  Transportation Needs: Not on file  Physical Activity: Not on file  Stress: Not on file  Social Connections: Not on file  Intimate Partner Violence: Not on file     History reviewed. Unable to obtain, patient is aphasic   Review of Systems:  Unable to obtain  Physical Exam: Vitals:   07/05/20 2357 07/06/20 0345 07/06/20 0859 07/06/20 1227  BP: (!) 150/68 137/73 125/74 (!) 148/78  Pulse: 71 76 69 74  Resp: 19 18 16 18   Temp: 99.3 F (37.4 C) 98.2 F (36.8 C) 98.1 F (36.7 C) 98.2 F (36.8 C)  TempSrc: Oral Oral Oral Oral  SpO2: 99% 99% 100% 100%  Weight:      Height:        GEN- The patient is well appearing, alert, aphasic, answers yes to most questions   Head- normocephalic, atraumatic Eyes-  Sclera clear, conjunctiva pink Ears- hearing  intact Oropharynx- clear Neck- supple Lungs- CTA , normal work of breathing Heart- RRR, no murmurs, rubs or gallops  GI- soft, NT, ND Extremities- no clubbing, cyanosis, or edema MS- no significant deformity or atrophy Skin- no rash or lesion Psych- euthymic mood, full affect   Labs:   Lab Results  Component Value Date   WBC 7.6 07/06/2020   HGB 8.9 (L) 07/06/2020   HCT 27.3 (L) 07/06/2020   MCV 86.1 07/06/2020   PLT 523 (H) 07/06/2020    Recent Labs  Lab 07/01/20 0513 07/02/20 0715 07/06/20 0445  NA 141   < > 139  K 3.2*   < >  3.3*  CL 113*   < > 107  CO2 17*   < > 22  BUN 13   < > 13  CREATININE 1.44*   < > 1.21*  CALCIUM 8.0*   < > 8.7*  PROT 5.3*  --   --   BILITOT 0.6  --   --   ALKPHOS 82  --   --   ALT 11  --   --   AST 17  --   --   GLUCOSE 138*   < > 137*   < > = values in this interval not displayed.   No results found for: CKTOTAL, CKMB, CKMBINDEX, TROPONINI Lab Results  Component Value Date   CHOL 143 07/01/2020   Lab Results  Component Value Date   HDL 41 07/01/2020   Lab Results  Component Value Date   LDLCALC 68 07/01/2020   Lab Results  Component Value Date   TRIG 172 (H) 07/01/2020   TRIG 164 (H) 07/01/2020   Lab Results  Component Value Date   CHOLHDL 3.5 07/01/2020   No results found for: LDLDIRECT  No results found for: DDIMER   Radiology/Studies:   CT ABDOMEN PELVIS WO CONTRAST Result Date: 07/02/2020 CLINICAL DATA:  Retroperitoneal hematoma.  Decreased hemoglobin. EXAM: CT ABDOMEN AND PELVIS WITHOUT CONTRAST TECHNIQUE: Multidetector CT imaging of the abdomen and pelvis was performed following the standard protocol without IV contrast. COMPARISON:  CT dated 07/14/2019 FINDINGS: Lower chest: There is atelectasis versus aspiration at the lung bases.The heart is enlarged. The intracardiac blood pool is hypodense relative to the adjacent myocardium consistent with anemia. Hepatobiliary: The liver is normal. Status post  cholecystectomy.There is no biliary ductal dilation. Pancreas: Normal contours without ductal dilatation. No peripancreatic fluid collection. Spleen: Unremarkable. Adrenals/Urinary Tract: --Adrenal glands: Unremarkable. --Right kidney/ureter: No hydronephrosis or radiopaque kidney stones. --Left kidney/ureter: No hydronephrosis or radiopaque kidney stones. --Urinary bladder: There is a Foley catheter in place. Stomach/Bowel: --Stomach/Duodenum: Patient is status post prior gastric bypass. The enteric tube terminates in the proximal small bowel. --Small bowel: Unremarkable. --Colon: There is a large amount of stool at the level of the rectum. --Appendix: Not visualized. No right lower quadrant inflammation or free fluid. Vascular/Lymphatic: There is fat stranding in the right inguinal region with a small retroperitoneal hematoma on the right. --No retroperitoneal lymphadenopathy. --No mesenteric lymphadenopathy. --No pelvic or inguinal lymphadenopathy. Reproductive: Unremarkable Other: No ascites or free air. There is a small fat containing umbilical hernia. Musculoskeletal. No acute displaced fractures. IMPRESSION: 1. Fat stranding in the right inguinal region with a small retroperitoneal hematoma on the right. 2. Cardiomegaly. 3. Anemia. 4. Bibasilar atelectasis versus aspiration. 5. Large amount of stool at the level of the rectum. Electronically Signed   By: Constance Holster M.D.   On: 07/02/2020 18:17    CT Code Stroke CTA Head W/WO contrast Result Date: 06/30/2020 CLINICAL DATA:  65 year old female code stroke presentation with left MCA ASPECTS 6. Left gaze deviation. EXAM: CT ANGIOGRAPHY HEAD AND NECK CT PERFUSION BRAIN TECHNIQUE: Multidetector CT imaging of the head and neck was performed using the standard protocol during bolus administration of intravenous contrast. Multiplanar CT image reconstructions and MIPs were obtained to evaluate the vascular anatomy. Carotid stenosis measurements (when  applicable) are obtained utilizing NASCET criteria, using the distal internal carotid diameter as the denominator. Multiphase CT imaging of the brain was performed following IV bolus contrast injection. Subsequent parametric perfusion maps were calculated using RAPID software. CONTRAST:  151m OMNIPAQUE IOHEXOL  350 MG/ML SOLN COMPARISON:  Plain head CT today 0721 hours. [facility] brain MRI 02/15/2016 FINDINGS: CT Brain Perfusion Findings: ASPECTS: 6 CBF (<30%) Volume: 42m (erroneous). Using CBF less than 38% 11 mL of parenchyma is detected which does partially correspond to the area of cytotoxic edema by plain CT. Perfusion (Tmax>6.0s) volume: 866m hypoperfusion index 0.1 but also might be spurrious. Mismatch Volume: Calculated is not accurate due to erroneously low infarct core, estimated penumbra given the above is 60 to 67 mL. Infarction Location:Left MCA The above was discussed by telephone with Dr. SRLesleigh Noen 06/30/2020 at 07:49 . CTA NECK Skeleton: No acute osseous abnormality identified. Dystrophic/degenerative calcifications of the longus coli muscle insertion at C1-C2. Upper chest: Negative. Other neck: No acute finding. Incidental contrast reflux into a left posterior neck vein. Aortic arch: Slightly bovine arch configuration. No arch atherosclerosis. Right carotid system: Mildly tortuous proximal right CCA. Moderate calcified plaque at the right ICA origin with less than 50 % stenosis with respect to the distal vessel. Mild tortuosity. Left carotid system: Patent, mildly tortuous left CCA. Mild to moderate calcified plaque at the posterior left ICA origin with less than 50 % stenosis with respect to the distal vessel. Mildly tortuous. Vertebral arteries: Negative proximal right subclavian artery and right vertebral artery origin. The right vertebral is mildly tortuous but patent to the skull base without stenosis. Moderate soft more than calcified plaque  in the proximal left subclavian artery although no significant stenosis. Left vertebral artery origin remains normal. Codominant left vertebral artery is patent to the skull base without stenosis. CTA HEAD Posterior circulation: Mild right V4 calcified plaque. Patent distal vertebral arteries to the basilar with mild stenosis at the vertebrobasilar junction. Diminutive PICA. Left AICA appears dominant. Patent basilar artery is diminutive but without stenosis. Patent SCA origins with fetal type bilateral PCA origins. Patent basilar tip. Bilateral PCA branches are within normal limits. Anterior circulation: Both ICA siphons are patent. No significant siphon plaque or stenosis. Normal posterior communicating artery origins. Patent carotid termini although the left MCA origin is occluded (series 10, image 19). There is some left MCA branch reconstitution as seen on series 13, image 43. Left ACA origin is normal. There is severe stenosis at the right ACA origin. Anterior communicating artery is diminutive. Bilateral A2 branches appear symmetric and within normal limits. Right MCA origin is patent but also irregular with mild stenosis. Right MCA M1 and right MCA bifurcation are patent, although with moderate stenosis at the posterior right MCA M2 origin (series 12, image 10). No right MCA branch occlusion identified. Venous sinuses: Early contrast timing, grossly patent. Anatomic variants: Fetal type PCA origins. Review of the MIP images confirms the above findings IMPRESSION: 1. Positive for emergent large vessel occlusion: Left MCA origin. Some left MCA branch reconstitution. 2. CT Perfusion underestimates infarct core (ASPECTS 6), which is estimated at 11-22 mL, subsequent estimated penumbra of 60 to 67 mL. 3. No other large vessel occlusion. But intracranial atherosclerosis with: - Severe stenosis Right ACA origin. - moderate stenosis Right MCA M2 branch origin. - mild stenosis Right MCA M1, also Vertebrobasilar  junction. 4. Cervical carotid atherosclerosis without stenosis. #1 discussed by telephone with Dr. SRLesleigh Noen 06/30/2020 at 0732 hours. And [facility] CTP findings discussed at 0749 hours. Electronically Signed   By: H Genevie Ann.D.   On: 06/30/2020 07:58     CT HEAD WO CONTRAST Result Date: 07/02/2020 CLINICAL DATA:  Stroke follow-up.  Intracranial hemorrhage. EXAM: CT HEAD WITHOUT CONTRAST TECHNIQUE: Contiguous axial images were obtained from the base of the skull through the vertex without intravenous contrast. COMPARISON:  CT head 06/30/2020, MRI head 07/01/2020 FINDINGS: Brain: Left MCA infarct with hypodensity in the left temporal and parietal lobe as well as in the left basal ganglia involving the caudate and putamen. Small 1 cm hemorrhage left temporal lobe unchanged from prior CT and MRI. Small focus of subarachnoid hemorrhage in the left lateral temporal lobe which was present on the prior CT. Ventricle size normal. No midline shift. No new area of infarction. Vascular: Negative for hyperdense vessel Skull: Negative Sinuses/Orbits: Paranasal sinuses clear. NG tube in place. Negative orbit. Other: None IMPRESSION: Acute left MCA infarct unchanged from prior studies. Small amount of hemorrhage left temporal lobe also unchanged. No new area of hemorrhage or infarction. Electronically Signed   By: Franchot Gallo M.D.   On: 07/02/2020 17:49    MR ANGIO HEAD WO CONTRAST Result Date: 07/01/2020 CLINICAL DATA:  Stroke.  Post left MCA thrombectomy. EXAM: MRI HEAD WITHOUT CONTRAST MRA HEAD WITHOUT CONTRAST TECHNIQUE: Multiplanar, multiecho pulse sequences of the brain and surrounding structures were obtained without intravenous contrast. Angiographic images of the head were obtained using MRA technique without contrast. COMPARISON:  CT head 06/30/2020.  CTA head 06/30/2020 FINDINGS: MRI HEAD FINDINGS Brain: Acute infarct left MCA territory. There is involvement of the left basal ganglia including the  caudate and putamen. There is infarct involving the superior left temporal lobe as well as the insula and the left parietal lobe. Small area of hemorrhage is present within the left posterior temporal lobe measuring approximately 1 cm. This was hyperdense on CT yesterday. Scattered small areas of acute infarct in the left frontal lobe. Small area of acute infarct in the right cerebellum and in the occipital white matter bilaterally. Ventricle size normal.  No midline shift.  No mass lesion. Vascular: Normal arterial flow voids Skull and upper cervical spine: Negative Sinuses/Orbits: Mild mucosal edema paranasal sinuses. Negative orbit Other: None MRA HEAD FINDINGS Decreased signal in the carotid bilaterally at the skull base is felt to be artifact. This area appears widely patent on CTA. Decreased signal left cavernous carotid also artifact. Both cavernous carotids are widely patent on CTA. Moderate stenosis proximal right A1 segment. Mild stenosis right M1. Moderate stenosis right MCA bifurcation. Interval placement of stent in the left middle cerebral artery. There is flow related signal in left MCA branches indicating the stent is patent. Both vertebral arteries patent to the basilar. Basilar widely patent. Right PICA patent. Left AICA patent. Superior cerebellar and posterior cerebral arteries patent bilaterally. Fetal origin left posterior cerebral artery. Right posterior communicating artery is patent. IMPRESSION: Acute infarct left MCA territory involving the basal ganglia as well as the left temporal, frontal, and parietal lobes. Small area of hemorrhage in the left posterior temporal lobe. Additional small areas of acute infarct in the occipital white matter bilaterally and right cerebellum. Left MCA stent appears patent. Moderate intracranial atherosclerotic disease as above. Electronically Signed   By: Franchot Gallo M.D.   On: 07/01/2020 18:55     MR BRAIN WO CONTRAST Result Date:  07/01/2020 CLINICAL DATA:  Stroke.  Post left MCA thrombectomy. EXAM: MRI HEAD WITHOUT CONTRAST MRA HEAD WITHOUT CONTRAST TECHNIQUE: Multiplanar, multiecho pulse sequences of the brain and surrounding structures were obtained without intravenous contrast. Angiographic images of the head were obtained using MRA technique without contrast. COMPARISON:  CT head 06/30/2020.  CTA  head 06/30/2020 FINDINGS: MRI HEAD FINDINGS Brain: Acute infarct left MCA territory. There is involvement of the left basal ganglia including the caudate and putamen. There is infarct involving the superior left temporal lobe as well as the insula and the left parietal lobe. Small area of hemorrhage is present within the left posterior temporal lobe measuring approximately 1 cm. This was hyperdense on CT yesterday. Scattered small areas of acute infarct in the left frontal lobe. Small area of acute infarct in the right cerebellum and in the occipital white matter bilaterally. Ventricle size normal.  No midline shift.  No mass lesion. Vascular: Normal arterial flow voids Skull and upper cervical spine: Negative Sinuses/Orbits: Mild mucosal edema paranasal sinuses. Negative orbit Other: None MRA HEAD FINDINGS Decreased signal in the carotid bilaterally at the skull base is felt to be artifact. This area appears widely patent on CTA. Decreased signal left cavernous carotid also artifact. Both cavernous carotids are widely patent on CTA. Moderate stenosis proximal right A1 segment. Mild stenosis right M1. Moderate stenosis right MCA bifurcation. Interval placement of stent in the left middle cerebral artery. There is flow related signal in left MCA branches indicating the stent is patent. Both vertebral arteries patent to the basilar. Basilar widely patent. Right PICA patent. Left AICA patent. Superior cerebellar and posterior cerebral arteries patent bilaterally. Fetal origin left posterior cerebral artery. Right posterior communicating artery is  patent. IMPRESSION: Acute infarct left MCA territory involving the basal ganglia as well as the left temporal, frontal, and parietal lobes. Small area of hemorrhage in the left posterior temporal lobe. Additional small areas of acute infarct in the occipital white matter bilaterally and right cerebellum. Left MCA stent appears patent. Moderate intracranial atherosclerotic disease as above. Electronically Signed   By: Franchot Gallo M.D.   On: 07/01/2020 18:55     IR Intra Cran Stent Result Date: 07/04/2020 INDICATION: Aphasia, left gaze deviation with right-sided weakness. 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 EXAM: 1. EMERGENT LARGE VESSEL OCCLUSION THROMBOLYSIS anterior CIRCULATION) COMPARISON:  CT angiogram of the head and neck of June 30, 2020. MEDICATIONS: Ancef 2 g IV antibiotic was administered within 1 hour of the procedure. ANESTHESIA/SEDATION: General anesthesia CONTRAST:  Isovue 300 approximately 120 mL FLUOROSCOPY TIME:  Fluoroscopy Time: 15 minutes 18 seconds (2667 mGy). COMPLICATIONS: None immediate. TECHNIQUE: Following a full explanation of the procedure along with the potential associated complications, an informed witnessed consent was obtained from the patient's daughter. The risks of intracranial hemorrhage of 10%, worsening neurological deficit, ventilator dependency, death and inability to revascularize were all reviewed in detail with the patient's daughter. The patient was then put under general anesthesia by the Department of Anesthesiology at [facility]. The right groin was prepped and draped in the usual sterile fashion. Thereafter using modified Seldinger technique, transfemoral access into the right common femoral artery was obtained without difficulty. Over a 0.035 inch guidewire an 8 [place] from 25 cm Pinnacle sheath was inserted. Through this, and also over a 0.035 inch guidewire a combination of Simmons 2 5.5 [place]  support catheter inside of 087 balloon guide catheter combination was advanced to the aortic arch, and cannulation was performed of the left common carotid artery. Over a 0.035 inch glide guidewire, the support catheter, and the 087 balloon guidewire were advanced to the left common carotid artery bifurcation. The guidewire and support Simmons 2 were removed. Good aspiration obtained from the hub of the balloon guide catheter.  Control arteriogram performed through the balloon guide in the left common carotid artery was performed. FINDINGS: The left common carotid arteriogram demonstrates the left external carotid artery and its major branches to be widely patent. The left internal carotid artery at the bulb to the cranial skull base demonstrates wide patency with moderate tortuosity in its mid cervical segment. The petrous, cavernous and supraclinoid segments demonstrate wide patency. 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 The left anterior cerebral artery demonstrates approximately 50% stenosis at its A1 segment. However, flow is noted distally into the left anterior cerebral artery distribution. Flash filling of the left posterior communicating artery is also demonstrated. PROCEDURE: Through the balloon guide catheter in the proximal left internal carotid artery, a combination of an 014 inch standard Synchro micro guidewire with an 021 Headway microcatheter and an 071 136 cm Zoom aspiration catheter was advanced as the combination to the supraclinoid left ICA. The balloon guide was advanced further distally into the distal cervical left ICA. Using a torque device, access was obtained into the occluded left middle cerebral artery into the inferior division branch M2 M3 region followed by the microcatheter. The guidewire was removed. Good aspiration obtained from the tip of the microcatheter. A gentle control arteriogram performed through microcatheter demonstrated safe  position of the tip of the microcatheter which was now connected to continuous heparinized saline infusion. An [place] retrieval device was then advanced to the distal end of the microcatheter. The O ring on the delivery microcatheter was loosened. The Tiger retrieval device was retrieved and deployed such that the proximal marker was just inside the proximal portion of the occluded left middle cerebral artery. The 071 aspiration catheter was advanced to just proximal to the origin of the right middle cerebral artery. Thereafter the retrieval device was expanded and decreased in size multiple times. 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 The retrieval device was again expanded right at the proximal aspect of the occlusion. Microcatheter was locked into position. Thereafter as constant aspiration was applied at the hub of the aspiration catheter at the origin of the left middle cerebral artery, and with the a 20 mL syringe at the hub of the balloon guide catheter in the left internal carotid artery with proximal occlusion for about 2 minutes, the combination of the retrieval device, the microcatheter and the Zoom aspiration device were retrieved and removed. Following reversal of flow arrest, control arteriogram performed through the balloon guide in the left internal carotid artery now demonstrated complete revascularization of the left middle cerebral artery distribution with a TICI 3 revascularization. However, this also unmasked a severe high-grade stenosis at the left MCA origin. At this point through the balloon guide in the left internal carotid artery, a combination of a 5 [place] Catalyst guide catheter inside of which was the 021 microcatheter was advanced over a 0.014 inch standard Synchro micro guidewire to the  supraclinoid left ICA. At this time there was complete occlusion of the left middle cerebral artery due to recoil phenomenon due to the atherosclerotic plaque. Micro guidewire was then gently advanced through the occluded left middle cerebral artery without difficulty in the superior division followed by the microcatheter. The guidewire was removed. Good aspiration obtained from the hub of the microcatheter. A gentle control arteriogram performed through this demonstrated safe position of the tip of  the microcatheter. This in turn was then replaced with an 014 inch supported 300 cm Synchro micro guidewire with a J-tip configuration under constant fluoroscopic guidance. The tip of the exchange micro guidewire was maintained as the microcatheter was removed. Measurements were then performed of the left middle cerebral artery in its most normal segment just distal to the occlusion. It was elected to proceed with placement of a 2.25 mm x 12 mm Synergy drug-eluting stent. This was a retrogradely prepped with heparinized saline infusion, and also with 50% contrast and 50% heparinized saline infusion. Using the rapid exchange technique, the stent delivery system was advanced without difficulty to the supraclinoid left ICA. The delivery of the stent was then advanced without difficulty through the occluded left middle cerebral artery. Its proximal marker was positioned just proximal to the occluded left middle cerebral artery proximally. Thereafter, a control inflation was then performed using micro inflation syringe device via micro tubing with the balloon inflated to approximately 2.1 mm where it was maintained for approximately 20 seconds. Thereafter, with the wire distal, the balloon was retrieved and removed. A control arteriogram performed through the 5 [place] guide catheter in the left internal carotid artery demonstrated complete angiographic revascularization of the left middle cerebral artery distribution  achieving a TICI 3 revascularization. Also noted now was patency of the left posterior cerebral artery distribution via the posterior communicating artery. Control arteriograms were then performed at 15 and 30 minutes post deployment of the stent which continued to demonstrate excellent flow through the stented segment also with patency of the superior and inferior division branches into the more distal M4 and M5 regions of the left middle cerebral artery distribution. Final control arteriogram performed through the balloon guide in the left internal carotid artery after removal of the 5 [place] Catalyst guide catheter, and also the exchange micro guidewire demonstrated continued excellent apposition and patency of the angioplastied segment of the left middle cerebral artery with no intra stent abnormalities. A distal perfusion was now noted into the M4 M5 regions. Moderate spasm at the middle cervical left ICA responded to 25 mcg of nitroglycerin intra-arterially. The left anterior cerebral artery remained widely patent with cross filling of the right anterior cerebral A2 segment as described earlier. Balloon guide was removed. The Pinnacle sheath was removed with successful hemostasis with an 8 French Angio-Seal closure device. Distal pulses remained Dopplerable in both feet unchanged. Patient was left intubated to protect her airway as per anesthesia. An immediate CT scan of the brain performed on the table demonstrated contrast stain in the left basal ganglia region, and also the left parietal subcortical area. Patient was loaded with aspirin 81 mg, and Brilinta 180 mg p.o. via an orogastric tube just prior to the balloon angioplasty. A loading dose of cangrelor IV was given right after placement of the stent, with a 4 hr low-dose infusion with a CT of the brain to follow. The patient's pupils were approximately 2 mm bilaterally though sluggish. The patient was then transferred to the PACU and then neuro ICU for  post revascularization treatment. IMPRESSION: Status post endovascular complete revascularization of occluded left middle cerebral artery M1 segment with 1 pass with the Tiger 17 retrieval device and proximal aspiration followed by placement of a 2.25 mm x 12 mm Synergy balloon mounted drug-eluting stent with achievement of a TICI 3 revascularization. PLAN: Follow-up in the clinic approximately 4 weeks post discharge. Electronically Signed   By: Luanne Bras M.D.   On: 07/01/2020 12:48  DG CHEST PORT 1 VIEW Result Date: 07/02/2020 CLINICAL DATA:  Fever, shortness of breath, hypertension. Status post LEFT MCA thrombectomy. EXAM: PORTABLE CHEST 1 VIEW COMPARISON:  Chest x-ray dated 06/30/2020. FINDINGS: Endotracheal tube has been removed. Enteric tube passes below the diaphragm. Heart size and mediastinal contours appear stable. Lungs are clear. No pleural effusion or pneumothorax is seen. IMPRESSION: No active disease. No evidence of pneumonia or pulmonary edema. Electronically Signed   By: Franki Cabot M.D.   On: 07/02/2020 11:01        EEG adult Result Date: 07/01/2020 Lora Havens, MD     07/01/2020 10:52 AM Patient Name: Courtney Burke MRN: [id number] Epilepsy Attending: Lora Havens Referring Physician/Provider: Dr. Rosalin Hawking Date: 07/01/2020 Duration: 24.04 mins Patient history: 65 year old female with recent left MCA stroke status post thrombectomy. EEG to evaluate for seizures. Level of alertness: Awake AEDs during EEG study: None Technical aspects: This EEG study was done with scalp electrodes positioned according to the 10-20 International system of electrode placement. Electrical activity was acquired at a sampling rate of 500Hz  and reviewed with a high frequency filter of 70Hz  and a low frequency filter of 1Hz . EEG data were recorded continuously and digitally stored. Description: The posterior dominant rhythm consists of 8 Hz activity of moderate voltage (25-35 uV) seen  predominantly in posterior head regions, symmetric and reactive to eye opening and eye closing.  EEG showed intermittent generalized and maximal left frontal region 3 to 5 Hz theta-delta slowing.  The 3 to 5 Hz theta and delta slowing in the left frontal region appeared sharply contoured and waxes and wanes without definite evolution.  Hyperventilation and photic stimulation were not performed.   ABNORMALITY -Frontal intermittent rhythmic delta activity, left frontal region -Intermittent slow, generalized and maximal left frontal region IMPRESSION: This study is suggestive of cortical dysfunction in left frontal region likely secondary underlying stroke as well as mild diffuse encephalopathy, nonspecific etiology.  Intermittent rhythmic delta activity in left frontal region is on the ictal-interictal continuum with low to intermediate potential for seizures.  No seizures or epileptiform discharges were seen throughout the recording. Priyanka O Yadav    VAS [place] LOWER EXTREMITY VENOUS (DVT) Result Date: 07/01/2020  Lower Venous DVT Study Other Indications: Embolic stroke. Risk Factors: None identified. Comparison Study: No previous exam Performing Technologist: Vonzell Schlatter RVT  Examination Guidelines: A complete evaluation includes B-mode imaging, spectral Doppler, color Doppler, and power Doppler as needed of all accessible portions of each vessel. Bilateral testing is considered an integral part of a complete examination. Limited examinations for reoccurring indications may be performed as noted. The reflux portion of the exam is performed with the patient in reverse Trendelenburg.  Summary: BILATERAL: - No evidence of deep vein thrombosis seen in the lower extremities, bilaterally. -No evidence of popliteal cyst, bilaterally. RIGHT: - Ultrasound characteristics of a ruptured Baker's Cyst are noted.   *See table(s) above for measurements and observations. Electronically signed by Jamelle Haring on 07/01/2020 at  5:16:40 PM.    Final     12-lead ECG SR All prior EKG's in EPIC reviewed with no documented atrial fibrillation  Telemetry SR, early  In her stay she had 2 episodes of brief CHB with marked bradycardia, in review of chart, both associated with vomiting episodes and likely vagal  Assessment and Plan:  1. Cryptogenic stroke The patient presents with cryptogenic stroke.   Dr. Curt Bears has seen and examined the patient, she appears alert, though with significant aphasia, answers  yes to most questions. Discussed loop monitoring and rational, she seems to be alert , though is definitely trying to communicate to [place] something when asked if we could speak t her family in regards to loop implant.  It was felt that it would be best follow up in the office to revisit implant/monitoring options with her once she has had some rehab. EP follow up is in place   [employer], [place] 07/06/2020  I have seen and examined this patient with Tommye Standard.  Agree with above, note added to reflect my findings.  On exam, RRR, no murmurs. Patient post CVA. Now with almost complete aphasia. Discussed LINQ monitoring but the patient has difficulty communicating. Brette Cast plan to see the patient back as an outpatient to ensure proper communication. The patient was trying to communicate during the exam but was having difficulty. Due to that, Mahina Salatino arrange to see the patient as an outpatient for [facility] monitor recording.  Antionetta Ator M. Magdala Brahmbhatt MD 07/06/2020 8:29 PM

## 2020-07-06 NOTE — Progress Notes (Signed)
PROGRESS NOTE  Courtney Burke AJO:[id number] DOB: 1955-09-17   PCP: [name], PA-C  Patient is from: Home  DOA: 06/30/2020 LOS: 6  Chief complaints: Altered mental status  Brief Narrative / Interim history: 65 year old female with PMH of HTN, DM-2, CKD-3A, dysphagia, obesity s/p gastric sleeve in 05/2020 brought to ED by EMS as code stroke in the setting of altered mental status.  She was also seen with eye rolling back in her head and brief LOC x2.  Found to have right-sided weakness on presentation.  CVA work-up revealed left MCA CVA likely due to M1 segment occlusion.  She underwent thrombectomy and stent placement by interventional radiologist on 06/30/2020.  Neurology recommended loop recorder.  Cardiology consulted for this.  SLP upgraded her to dysphagia 2 diet. Has been accepted to CIR pending bed.   See individual problem list below for more hospital course.   Subjective: Seen and examined earlier this morning.  No major events overnight of this morning.  No complaints but she cannot verbalize.  She responds yes to pain but not able to localize. She nods yes everywhere I points.  She follows commands though.  Does not appear to be in pain  Objective: Vitals:   07/05/20 2357 07/06/20 0345 07/06/20 0859 07/06/20 1227  BP: (!) 150/68 137/73 125/74 (!) 148/78  Pulse: 71 76 69 74  Resp: 19 18 16 18   Temp: 99.3 F (37.4 C) 98.2 F (36.8 C) 98.1 F (36.7 C) 98.2 F (36.8 C)  TempSrc: Oral Oral Oral Oral  SpO2: 99% 99% 100% 100%  Weight:      Height:        Intake/Output Summary (Last 24 hours) at 07/06/2020 1507 Last data filed at 07/06/2020 0800 Gross per 24 hour  Intake 260 ml  Output 425 ml  Net -165 ml   Filed Weights   07/03/20 0500 07/04/20 0500 07/04/20 2153  Weight: 93.3 kg 93 kg 93.1 kg    Examination:  GENERAL: No apparent distress.  Nontoxic. HEENT: MMM.  Vision and hearing grossly intact.  NECK: Supple.  No apparent JVD.  RESP: On RA.  No  IWOB.  Fair aeration bilaterally. CVS:  RRR. Heart sounds normal.  ABD/GI/GU: BS+. Abd soft, NTND.  MSK/EXT:  Moves extremities. No apparent deformity. No edema.  SKIN: no apparent skin lesion or wound NEURO: Awake and alert.  Not able to assess orientation.  Follows commands.  Mild residual right-sided hemiparesis PSYCH: Calm. Normal affect.   Procedures:  06/30/2020-left M1 thrombectomy and stent placement.  Microbiology summarized: COVID-19 PCR nonreactive.  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 -s/p left M1 thrombectomy with stent placement on 1/27 -Continue statin, DAPT with aspirin and Brilinta -Continue Keppra for seizure like activity prior to admission -Neurology to consult EP for loop recorder-discussed with Dr. Leonie Man -SLP upgraded her to dysphagia 2 diet -Approved for CIR and waiting on bed.  Dysphagia likely due to CVA: seems to have some element of chronic dysphagia based on chart review. Cortrak in place -Upgraded to dysphagia 2 diet by SLP -Will discontinue Cortrak if oral intake is adequate.  Essential hypertension: BP within normal range lately. -Continue monitoring  History of DM-2?: A1c 5.4%.  Does not appear to be on diabetic medication. Recent Labs  Lab 07/04/20 1518 07/04/20 1949 07/05/20 0808 07/05/20 1219 07/05/20 1704  GLUCAP 117* 85 93 111* 98  -Stopped SSI and CBG monitoring -Continue Lipitor.  ABLA/iron deficiency anemia: H&H relatively stable.  Anemia panel consistent with iron deficiency Recent Labs    06/30/20 0710 06/30/20 0719 06/30/20 1405 07/01/20 0513 07/02/20 0715 07/02/20 1341 07/03/20 0534 07/04/20 0707 07/05/20 0252 07/06/20 0445  HGB 11.5* 11.2* 9.9* 9.1* 7.9* 8.2* 8.5* 8.2* 9.2* 8.9*  -Continue monitoring -IV iron today  AKI on CKD-3A: AKI resolved. Recent Labs    07/08/19 1202 06/30/20 0710 06/30/20 0719  07/01/20 0513 07/02/20 0715 07/03/20 0534 07/04/20 0707 07/05/20 0252 07/06/20 0445  BUN 16 14 17 13 11 12 11 12 13   CREATININE 1.59* 1.41* 1.30* 1.44* 1.47* 1.26* 1.16* 1.20* 1.21*   Acute urinary retention -Continue bethanechol -Discontinue Foley -Voiding trial  Hypokalemia: K3.3. -Replenish and recheck  Morbid obesity: S/p gastric sleeve 05/2020 Body mass index is 37.54 kg/m. Nutrition Problem: Increased nutrient needs Etiology: dysphagia Signs/Symptoms: estimated needs Interventions: Ensure Enlive (each supplement provides 350kcal and 20 grams of protein),Magic cup   DVT prophylaxis:  enoxaparin (LOVENOX) injection 40 mg Start: 07/01/20 2200 SCD's Start: 06/30/20 9179  Code Status: Full code Family Communication: Updated patient's daughter over the phone. Level of care: Telemetry Medical Status is: Inpatient  Remains inpatient appropriate because:Unsafe d/c plan   Dispo: The patient is from: Home              Anticipated d/c is to: CIR once bed available.              Anticipated d/c date is: 1 day              Patient currently is medically stable to d/c.   Difficult to place patient No       Consultants:  Neurology-signed off Interventional radiology-signed off General surgery-signed off   Sch Meds:  Scheduled Meds: . aspirin  81 mg Oral Daily   Or  . aspirin  81 mg Per Tube Daily  . atorvastatin  80 mg Per Tube QHS  . bethanechol  25 mg Per Tube TID  . calcium citrate  200 mg of elemental calcium Oral TID  . chlorhexidine gluconate (MEDLINE KIT)  15 mL Mouth Rinse BID  . Chlorhexidine Gluconate Cloth  6 each Topical Daily  . cholecalciferol  1,000 Units Oral QAC breakfast  . docusate  100 mg Per Tube BID  . enoxaparin (LOVENOX) injection  40 mg Subcutaneous Q24H  . feeding supplement  237 mL Oral BID BM  . feeding supplement (PROSource TF)  45 mL Per Tube BID  . free water  200 mL Per Tube Q4H  . levETIRAcetam  500 mg Per Tube BID  .  multivitamin with minerals  1 tablet Per Tube BID  . pantoprazole sodium  40 mg Per Tube Daily  . polyethylene glycol  17 g Per Tube Daily  . senna-docusate  1 tablet Per Tube QHS  . sodium chloride flush  3 mL Intravenous Once  . ticagrelor  90 mg Oral BID   Or  . ticagrelor  90 mg Per Tube BID   Continuous Infusions: . feeding supplement (OSMOLITE 1.2 CAL) Stopped (07/03/20 0130)   PRN Meds:.acetaminophen **OR** acetaminophen (TYLENOL) oral liquid 160 mg/5 mL **OR** acetaminophen, albuterol, iohexol, labetalol, ondansetron (ZOFRAN) IV, Resource ThickenUp Clear  Antimicrobials: Anti-infectives (From admission, onward)   Start     Dose/Rate Route Frequency Ordered Stop   06/30/20 0801  ceFAZolin (ANCEF) 2-4 GM/100ML-% IVPB       Note to Pharmacy: Fredric Dine   : cabinet override  06/30/20 0801 06/30/20 2014       I have personally reviewed the following labs and images: CBC: Recent Labs  Lab 06/30/20 0710 06/30/20 0719 07/01/20 0513 07/02/20 0715 07/02/20 1341 07/03/20 0534 07/04/20 0707 07/05/20 0252 07/06/20 0445  WBC 6.1  --  11.1*   < > 10.3 9.3 7.0 7.3 7.6  NEUTROABS 3.6  --  9.5*  --   --   --   --   --   --   HGB 11.5*   < > 9.1*   < > 8.2* 8.5* 8.2* 9.2* 8.9*  HCT 36.3   < > 27.1*   < > 25.2* 25.2* 25.0* 28.4* 27.3*  MCV 88.5  --  84.4   < > 86.3 86.9 86.2 86.1 86.1  PLT 507*  --  470*   < > 369 385 402* 487* 523*   < > = values in this interval not displayed.   BMP &GFR Recent Labs  Lab 07/02/20 0715 07/03/20 0534 07/04/20 0707 07/05/20 0252 07/06/20 0445  NA 143 146* 145 143 139  K 3.7 3.8 3.8 3.6 3.3*  CL 115* 115* 114* 109 107  CO2 21* 20* 22 22 22   GLUCOSE 135* 107* 92 91 137*  BUN 11 12 11 12 13   CREATININE 1.47* 1.26* 1.16* 1.20* 1.21*  CALCIUM 8.2* 8.4* 8.8* 8.9 8.7*   Estimated Creatinine Clearance: 49.2 mL/min (A) (by C-G formula based on SCr of 1.21 mg/dL (H)). Liver & Pancreas: Recent Labs  Lab 06/30/20 0710 07/01/20 0513   AST 21 17  ALT 15 11  ALKPHOS 101 82  BILITOT 0.5 0.6  PROT 6.4* 5.3*  ALBUMIN 3.4* 2.8*   No results for input(s): LIPASE, AMYLASE in the last 168 hours. No results for input(s): AMMONIA in the last 168 hours. Diabetic: No results for input(s): HGBA1C in the last 72 hours. Recent Labs  Lab 07/04/20 1518 07/04/20 1949 07/05/20 0808 07/05/20 1219 07/05/20 1704  GLUCAP 117* 85 93 111* 98   Cardiac Enzymes: No results for input(s): CKTOTAL, CKMB, CKMBINDEX, TROPONINI in the last 168 hours. No results for input(s): PROBNP in the last 8760 hours. Coagulation Profile: Recent Labs  Lab 06/30/20 0710  INR 1.1   Thyroid Function Tests: No results for input(s): TSH, T4TOTAL, FREET4, T3FREE, THYROIDAB in the last 72 hours. Lipid Profile: No results for input(s): CHOL, HDL, LDLCALC, TRIG, CHOLHDL, LDLDIRECT in the last 72 hours. Anemia Panel: Recent Labs    07/06/20 0445  VITAMINB12 243  FOLATE 6.4  FERRITIN 59  TIBC 266  IRON 26*  RETICCTPCT 3.0   Urine analysis:    Component Value Date/Time   COLORURINE YELLOW 06/30/2020 0856   APPEARANCEUR CLEAR 06/30/2020 0856   LABSPEC 1.017 06/30/2020 0856   PHURINE 5.0 06/30/2020 0856   GLUCOSEU NEGATIVE 06/30/2020 0856   HGBUR MODERATE (A) 06/30/2020 0856   BILIRUBINUR NEGATIVE 06/30/2020 0856   KETONESUR NEGATIVE 06/30/2020 0856   PROTEINUR NEGATIVE 06/30/2020 0856   NITRITE NEGATIVE 06/30/2020 0856   LEUKOCYTESUR TRACE (A) 06/30/2020 0856   Sepsis Labs: Invalid input(s): PROCALCITONIN, [place]  Microbiology: Recent Results (from the past 240 hour(s))  SARS Coronavirus 2 by RT PCR (hospital order, performed in [facility] hospital lab) Nasopharyngeal Nasopharyngeal Swab     Status: None   Collection Time: 06/30/20  7:36 AM   Specimen: Nasopharyngeal Swab  Result Value Ref Range Status   SARS Coronavirus 2 NEGATIVE NEGATIVE Final    Comment: (NOTE) SARS-CoV-2 target nucleic acids are  NOT DETECTED.  The  SARS-CoV-2 RNA is generally detectable in upper and lower respiratory specimens during the acute phase of infection. The lowest concentration of SARS-CoV-2 viral copies this assay can detect is 250 copies / mL. A negative result does not preclude SARS-CoV-2 infection and should not be used as the sole basis for treatment or other patient management decisions.  A negative result may occur with improper specimen collection / handling, submission of specimen other than nasopharyngeal swab, presence of viral mutation(s) within the areas targeted by this assay, and inadequate number of viral copies (<250 copies / mL). A negative result must be combined with clinical observations, patient history, and epidemiological information.  Fact Sheet for Patients:   StrictlyIdeas.no  Fact Sheet for Healthcare Providers: BankingDealers.co.za  This test is not yet approved or  cleared by the [place] FDA and has been authorized for detection and/or diagnosis of SARS-CoV-2 by FDA under an Emergency Use Authorization (EUA).  This EUA will remain in effect (meaning this test can be used) for the duration of the COVID-19 declaration under Section 564(b)(1) of the Act, 21 U.S.C. section 360bbb-3(b)(1), unless the authorization is terminated or revoked sooner.  Performed at [facility], [place] [address], [place], [place] [address]   MRSA PCR Screening     Status: None   Collection Time: 06/30/20  1:35 PM   Specimen: Nasal Mucosa; Nasopharyngeal  Result Value Ref Range Status   MRSA by PCR NEGATIVE NEGATIVE Final    Comment:        The GeneXpert MRSA Assay (FDA approved for NASAL specimens only), is one component of a comprehensive MRSA colonization surveillance program. It is not intended to diagnose MRSA infection nor to guide or monitor treatment for MRSA infections. Performed at [facility], [place] [address], [place], [place]  [address]     Radiology Studies: No results found.  Chyenne Sobczak T. Ruby  If 7PM-7AM, please contact night-coverage [email or web address] 07/06/2020, 3:07 PM

## 2020-07-07 DIAGNOSIS — R338 Other retention of urine: Secondary | ICD-10-CM | POA: Diagnosis not present

## 2020-07-07 DIAGNOSIS — I10 Essential (primary) hypertension: Secondary | ICD-10-CM | POA: Diagnosis not present

## 2020-07-07 DIAGNOSIS — N1831 Chronic kidney disease, stage 3a: Secondary | ICD-10-CM | POA: Diagnosis not present

## 2020-07-07 DIAGNOSIS — D509 Iron deficiency anemia, unspecified: Secondary | ICD-10-CM | POA: Diagnosis not present

## 2020-07-07 LAB — MAGNESIUM: Magnesium: 2.1 mg/dL (ref 1.7–2.4)

## 2020-07-07 LAB — RENAL FUNCTION PANEL
Albumin: 3 g/dL — ABNORMAL LOW (ref 3.5–5.0)
Anion gap: 14 (ref 5–15)
BUN: 13 mg/dL (ref 8–23)
CO2: 19 mmol/L — ABNORMAL LOW (ref 22–32)
Calcium: 9.3 mg/dL (ref 8.9–10.3)
Chloride: 103 mmol/L (ref 98–111)
Creatinine, Ser: 1.25 mg/dL — ABNORMAL HIGH (ref 0.44–1.00)
GFR, Estimated: 48 mL/min — ABNORMAL LOW (ref >60)
Glucose, Bld: 88 mg/dL (ref 70–99)
Phosphorus: 3.6 mg/dL (ref 2.5–4.6)
Potassium: 4.3 mmol/L (ref 3.5–5.1)
Sodium: 136 mmol/L (ref 135–145)

## 2020-07-07 LAB — HEMOGLOBIN AND HEMATOCRIT, BLOOD
HCT: 31.5 % — ABNORMAL LOW (ref 36.0–46.0)
Hemoglobin: 10.3 g/dL — ABNORMAL LOW (ref 12.0–15.0)

## 2020-07-07 NOTE — Progress Notes (Signed)
  Speech Language Pathology Treatment: Dysphagia;Cognitive-Linquistic  Patient Details Name: Courtney Burke MRN: [id number] DOB: 1955-12-22 Today's Date: 07/07/2020 Time: 8841-6606 SLP Time Calculation (min) (ACUTE ONLY): 27 min  Assessment / Plan / Recommendation Clinical Impression  Pt consumed thin liquids with throat clears noted throughout all trials so continuation of DYS 2 diet with nectar thick liquids is recommended. Pt was also seen for cognitive-linguistic treatment in which pt was noted to initiate verbalizations (not present in previous sessions). Some of the spontaneous words noted during today's session included: yes, no, yeah, hey, stay here. Pt answered yes/no questions using yes/no verbalizations and head nodding/shaking with about 40% accuracy. With repetitive naming, pt counted to 5 with Max multimodal cues and modeling and counted to four spontaneously. While singing, she demonstrated good tune and a few word approximations were noted. She also followed a few commands while singing to clap her hands when given models but with other structured tasks followed commands with 0% accuracy despite Max multimodal cues. SLP will continue to f/u for dysphagia and cognitive-linguistic tx.    HPI HPI: Courtney Burke is a 65 y.o. 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 CT probable few small areas of acute left MCA infarction, less apparent. Ill-defined patchy hyperdensity may reflect contrast. Found to have M1 occlusion and underwent thrombectomy and intubated 1/27-1/28 am. MRI pending.      SLP Plan  Continue with current plan of care       Recommendations  Diet recommendations: Nectar-thick liquid;Dysphagia 2 (fine chop) Liquids provided via: Cup Medication Administration: Crushed with puree Supervision: Patient able to self feed;Staff to assist with  self feeding;Full supervision/cueing for compensatory strategies Compensations: Slow rate;Small sips/bites;Minimize environmental distractions Postural Changes and/or Swallow Maneuvers: Seated upright 90 degrees                Oral Care Recommendations: Oral care BID Follow up Recommendations: Inpatient Rehab SLP Visit Diagnosis: Dysphagia, oropharyngeal phase (R13.12);Aphasia (R47.01) Plan: Continue with current plan of care       GO                Jeanine Luz., SLP Student 07/07/2020, 11:19 AM

## 2020-07-07 NOTE — Progress Notes (Signed)
PROGRESS NOTE  Courtney Burke MGN:[id number] DOB: Oct 16, 1955   PCP: [name], PA-C  Patient is from: Home  DOA: 06/30/2020 LOS: 7  Chief complaints: Altered mental status  Brief Narrative / Interim history: 65 year old female with PMH of HTN, DM-2, CKD-3A, dysphagia, obesity s/p gastric sleeve in 05/2020 brought to ED by EMS as code stroke in the setting of altered mental status.  She was also seen with eye rolling back in her head and brief LOC x2.  Found to have right-sided weakness on presentation.  CVA work-up revealed left MCA CVA likely due to M1 segment occlusion.  She underwent thrombectomy and stent placement by interventional radiologist on 06/30/2020.  Neurology recommended loop recorder.  Cardiology consulted for this.  SLP upgraded her to dysphagia 2 diet. Has been accepted to CIR pending bed.   See individual problem list below for more hospital course.   Subjective: Seen and examined earlier this morning.  No major events overnight or this morning.  Cannot talk due to severe aphasia.  Follows commands.  Does not appear to be in distress.  Had about 400 cc UOP charted overnight.  Objective: Vitals:   07/07/20 0429 07/07/20 0500 07/07/20 0758 07/07/20 1151  BP: (!) 142/73  127/80 124/89  Pulse: 78  79 81  Resp: 17  18 18   Temp: 99.4 F (37.4 C)  98.6 F (37 C) 98.3 F (36.8 C)  TempSrc: Oral  Oral Oral  SpO2: 98%  98% 100%  Weight:  91.3 kg    Height:        Intake/Output Summary (Last 24 hours) at 07/07/2020 1258 Last data filed at 07/07/2020 1021 Gross per 24 hour  Intake 120 ml  Output 1500 ml  Net -1380 ml   Filed Weights   07/04/20 0500 07/04/20 2153 07/07/20 0500  Weight: 93 kg 93.1 kg 91.3 kg    Examination:  GENERAL: No apparent distress.  Nontoxic. HEENT: MMM.  Vision and hearing grossly intact.  NECK: Supple.  No apparent JVD.  RESP: On RA.  No IWOB.  Fair aeration bilaterally. CVS:  RRR. Heart sounds normal.  ABD/GI/GU: BS+. Abd  soft, NTND.  MSK/EXT:  Moves extremities. No apparent deformity. No edema.  SKIN: no apparent skin lesion or wound NEURO: Awake and alert. Follows commands.  Mild residual right hemiparesis.  PSYCH: Calm. Normal affect.   Procedures:  06/30/2020-left M1 thrombectomy and stent placement.  Microbiology summarized: COVID-19 PCR nonreactive.  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 -s/p left M1 thrombectomy with stent placement on 1/27 -Continue statin, DAPT with aspirin and Brilinta -Continue Keppra for seizure like activity prior to admission -EP to arrange outpatient follow-up for event monitor -SLP upgraded her to dysphagia 2 diet -Approved for CIR and waiting on bed.  Dysphagia likely due to CVA: seems to have some element of chronic dysphagia based on chart review. Cortrak in place -Upgraded to dysphagia 2 diet by SLP -keep cortrak until she has adequate caloric intake by mouth.  Essential hypertension: BP within normal range lately. -Continue monitoring  History of DM-2?: A1c 5.4%.  Does not appear to be on diabetic medication. -Stopped SSI and CBG monitoring -Continue Lipitor.  ABLA/iron deficiency anemia: H&H relatively stable.  Anemia panel consistent with iron deficiency Recent Labs    06/30/20 0719 06/30/20 1405 07/01/20 0513 07/02/20 0715 07/02/20 1341 07/03/20 0534 07/04/20 0707 07/05/20 0252 07/06/20 0445 07/07/20 0348  HGB 11.2*  9.9* 9.1* 7.9* 8.2* 8.5* 8.2* 9.2* 8.9* 10.3*  -Received IV Feraheme on 2/2.  -Continue monitoring  AKI on CKD-3A: AKI resolved. Recent Labs    06/30/20 0710 06/30/20 0719 07/01/20 0513 07/02/20 0715 07/03/20 0534 07/04/20 0707 07/05/20 0252 07/06/20 0445 07/07/20 0348  BUN 14 17 13 11 12 11 12 13 13   CREATININE 1.41* 1.30* 1.44* 1.47* 1.26* 1.16* 1.20* 1.21* 1.25*   Acute urinary retention: Seems to have passed  voiding trial. -Continue bethanechol -Monitor urine output  Hypokalemia: K3.3. -Replenish and recheck  Morbid obesity: S/p gastric sleeve 05/2020 Body mass index is 36.81 kg/m. Nutrition Problem: Increased nutrient needs Etiology: dysphagia Signs/Symptoms: estimated needs Interventions: Ensure Enlive (each supplement provides 350kcal and 20 grams of protein),Magic cup   DVT prophylaxis:  enoxaparin (LOVENOX) injection 40 mg Start: 07/01/20 2200 SCD's Start: 06/30/20 9735  Code Status: Full code Family Communication: Updated patient's daughter over the phone on 2/3. Level of care: Telemetry Medical Status is: Inpatient  Remains inpatient appropriate because:Unsafe d/c plan   Dispo: The patient is from: Home              Anticipated d/c is to: CIR once bed available.              Anticipated d/c date is: 1 day              Patient currently is medically stable to d/c.   Difficult to place patient No       Consultants:  Neurology-signed off Interventional radiology-signed off General surgery-signed off   Sch Meds:  Scheduled Meds: . aspirin  81 mg Oral Daily   Or  . aspirin  81 mg Per Tube Daily  . atorvastatin  80 mg Per Tube QHS  . bethanechol  25 mg Per Tube TID  . calcium citrate  200 mg of elemental calcium Per Tube TID  . chlorhexidine gluconate (MEDLINE KIT)  15 mL Mouth Rinse BID  . Chlorhexidine Gluconate Cloth  6 each Topical Daily  . cholecalciferol  1,000 Units Per Tube QAC breakfast  . docusate  100 mg Per Tube BID  . enoxaparin (LOVENOX) injection  40 mg Subcutaneous Q24H  . feeding supplement  237 mL Oral BID BM  . feeding supplement (PROSource TF)  45 mL Per Tube BID  . free water  200 mL Per Tube Q4H  . levETIRAcetam  500 mg Per Tube BID  . multivitamin with minerals  1 tablet Per Tube BID  . pantoprazole sodium  40 mg Per Tube Daily  . polyethylene glycol  17 g Per Tube Daily  . senna-docusate  1 tablet Per Tube QHS  . sodium chloride  flush  3 mL Intravenous Once  . ticagrelor  90 mg Oral BID   Or  . ticagrelor  90 mg Per Tube BID   Continuous Infusions: . feeding supplement (OSMOLITE 1.2 CAL) Stopped (07/03/20 0130)   PRN Meds:.acetaminophen **OR** acetaminophen (TYLENOL) oral liquid 160 mg/5 mL **OR** acetaminophen, albuterol, iohexol, labetalol, ondansetron (ZOFRAN) IV, Resource ThickenUp Clear  Antimicrobials: Anti-infectives (From admission, onward)   Start     Dose/Rate Route Frequency Ordered Stop   06/30/20 0801  ceFAZolin (ANCEF) 2-4 GM/100ML-% IVPB       Note to Pharmacy: Fredric Dine   : cabinet override      06/30/20 0801 06/30/20 2014       I have personally reviewed the following labs and images: CBC: Recent Labs  Lab 07/01/20 0513 07/02/20 0715 07/02/20  1341 07/03/20 0534 07/04/20 0707 07/05/20 0252 07/06/20 0445 07/07/20 0348  WBC 11.1*   < > 10.3 9.3 7.0 7.3 7.6  --   NEUTROABS 9.5*  --   --   --   --   --   --   --   HGB 9.1*   < > 8.2* 8.5* 8.2* 9.2* 8.9* 10.3*  HCT 27.1*   < > 25.2* 25.2* 25.0* 28.4* 27.3* 31.5*  MCV 84.4   < > 86.3 86.9 86.2 86.1 86.1  --   PLT 470*   < > 369 385 402* 487* 523*  --    < > = values in this interval not displayed.   BMP &GFR Recent Labs  Lab 07/03/20 0534 07/04/20 0707 07/05/20 0252 07/06/20 0445 07/07/20 0348  NA 146* 145 143 139 136  K 3.8 3.8 3.6 3.3* 4.3  CL 115* 114* 109 107 103  CO2 20* 22 22 22  19*  GLUCOSE 107* 92 91 137* 88  BUN 12 11 12 13 13   CREATININE 1.26* 1.16* 1.20* 1.21* 1.25*  CALCIUM 8.4* 8.8* 8.9 8.7* 9.3  MG  --   --   --   --  2.1  PHOS  --   --   --   --  3.6   Estimated Creatinine Clearance: 47.2 mL/min (A) (by C-G formula based on SCr of 1.25 mg/dL (H)). Liver & Pancreas: Recent Labs  Lab 07/01/20 0513 07/07/20 0348  AST 17  --   ALT 11  --   ALKPHOS 82  --   BILITOT 0.6  --   PROT 5.3*  --   ALBUMIN 2.8* 3.0*   No results for input(s): LIPASE, AMYLASE in the last 168 hours. No results for  input(s): AMMONIA in the last 168 hours. Diabetic: No results for input(s): HGBA1C in the last 72 hours. Recent Labs  Lab 07/04/20 1518 07/04/20 1949 07/05/20 0808 07/05/20 1219 07/05/20 1704  GLUCAP 117* 85 93 111* 98   Cardiac Enzymes: No results for input(s): CKTOTAL, CKMB, CKMBINDEX, TROPONINI in the last 168 hours. No results for input(s): PROBNP in the last 8760 hours. Coagulation Profile: No results for input(s): INR, PROTIME in the last 168 hours. Thyroid Function Tests: No results for input(s): TSH, T4TOTAL, FREET4, T3FREE, THYROIDAB in the last 72 hours. Lipid Profile: No results for input(s): CHOL, HDL, LDLCALC, TRIG, CHOLHDL, LDLDIRECT in the last 72 hours. Anemia Panel: Recent Labs    07/06/20 0445  VITAMINB12 243  FOLATE 6.4  FERRITIN 59  TIBC 266  IRON 26*  RETICCTPCT 3.0   Urine analysis:    Component Value Date/Time   COLORURINE YELLOW 06/30/2020 0856   APPEARANCEUR CLEAR 06/30/2020 0856   LABSPEC 1.017 06/30/2020 0856   PHURINE 5.0 06/30/2020 0856   GLUCOSEU NEGATIVE 06/30/2020 0856   HGBUR MODERATE (A) 06/30/2020 0856   BILIRUBINUR NEGATIVE 06/30/2020 0856   KETONESUR NEGATIVE 06/30/2020 0856   PROTEINUR NEGATIVE 06/30/2020 0856   NITRITE NEGATIVE 06/30/2020 0856   LEUKOCYTESUR TRACE (A) 06/30/2020 0856   Sepsis Labs: Invalid input(s): PROCALCITONIN, [place]  Microbiology: Recent Results (from the past 240 hour(s))  SARS Coronavirus 2 by RT PCR (hospital order, performed in [facility] hospital lab) Nasopharyngeal Nasopharyngeal Swab     Status: None   Collection Time: 06/30/20  7:36 AM   Specimen: Nasopharyngeal Swab  Result Value Ref Range Status   SARS Coronavirus 2 NEGATIVE NEGATIVE Final    Comment: (NOTE) SARS-CoV-2 target nucleic acids are NOT DETECTED.  The SARS-CoV-2 RNA is generally detectable in upper and lower respiratory specimens during the acute phase of infection. The lowest concentration of SARS-CoV-2 viral copies  this assay can detect is 250 copies / mL. A negative result does not preclude SARS-CoV-2 infection and should not be used as the sole basis for treatment or other patient management decisions.  A negative result may occur with improper specimen collection / handling, submission of specimen other than nasopharyngeal swab, presence of viral mutation(s) within the areas targeted by this assay, and inadequate number of viral copies (<250 copies / mL). A negative result must be combined with clinical observations, patient history, and epidemiological information.  Fact Sheet for Patients:   StrictlyIdeas.no  Fact Sheet for Healthcare Providers: BankingDealers.co.za  This test is not yet approved or  cleared by the [place] FDA and has been authorized for detection and/or diagnosis of SARS-CoV-2 by FDA under an Emergency Use Authorization (EUA).  This EUA will remain in effect (meaning this test can be used) for the duration of the COVID-19 declaration under Section 564(b)(1) of the Act, 21 U.S.C. section 360bbb-3(b)(1), unless the authorization is terminated or revoked sooner.  Performed at [facility], [place] [address], [place], [place] [address]   MRSA PCR Screening     Status: None   Collection Time: 06/30/20  1:35 PM   Specimen: Nasal Mucosa; Nasopharyngeal  Result Value Ref Range Status   MRSA by PCR NEGATIVE NEGATIVE Final    Comment:        The GeneXpert MRSA Assay (FDA approved for NASAL specimens only), is one component of a comprehensive MRSA colonization surveillance program. It is not intended to diagnose MRSA infection nor to guide or monitor treatment for MRSA infections. Performed at [facility], [place] [address] [place], [place] [address]     Radiology Studies: No results found.  Dasan Hardman T. Ages  If 7PM-7AM, please contact night-coverage [email or web address] 07/07/2020, 12:58 PM

## 2020-07-07 NOTE — Progress Notes (Signed)
Physical Therapy Treatment Patient Details Name: Courtney Burke MRN: [id number] DOB: 17-Jul-1955 Today's Date: 07/07/2020    History of Present Illness 65 y.o. female with a PMHx of HTN, DM, CKD III, asthma, Vit D deficiency, obesity s/p gastric sleeve 12/21 who presents to [facility] ED via EMS as a CODE STROKE with AMS and aphasia. Pt's brother also reports multiple periods of LOC. In ED pt demonstrates L gaze and R hemiparesis. CTA demonstrates L M1 occlusion. Pt underwent L common carotid arteriogram with revascularization of L MCA on 06/30/2020.    PT Comments    Pt tolerates treatment well despite reports of fatigue. Pt continues to demonstrate expressive aphasia but is attempting to verbalize during session at times. Pt demonstrates continues R sided weakness with impaired utilization of RW due to poor grip strength. Pt is at an increased risk for falls due to strength, balance, and endurance deficits. Pt will benefit from aggressive mobilization and acute PT services to improve mobility quality and to reduce falls risk. PT continues to recommend CIR at the time of discharge.   Follow Up Recommendations  CIR     Equipment Recommendations  Wheelchair (measurements PT);Hospital bed;Rolling walker with 5" wheels    Recommendations for Other Services       Precautions / Restrictions Precautions Precautions: Fall Restrictions Weight Bearing Restrictions: No    Mobility  Bed Mobility Overal bed mobility: Needs Assistance Bed Mobility: Supine to Sit   Sidelying to sit: Min assist;HOB elevated          Transfers Overall transfer level: Needs assistance Equipment used: Rolling walker (2 wheeled);1 person hand held assist Transfers: Sit to/from Stand Sit to Stand: Min assist;Mod assist Stand pivot transfers: Min assist       General transfer comment: minA with HHA for initial transfers. Pt with modA later in session possibly due to fatigue  Ambulation/Gait Ambulation/Gait  assistance: Min assist Gait Distance (Feet): 30 Feet (additional trial of 8') Assistive device: Rolling walker (2 wheeled) Gait Pattern/deviations: Step-to pattern Gait velocity: reduced Gait velocity interpretation: <1.31 ft/sec, indicative of household ambulator General Gait Details: pt with short step-to gait, PT providing assistance to maintain RUE on walker. Pt with R foot drag and reduced step length on R side   Stairs             Wheelchair Mobility    Modified Rankin (Stroke Patients Only) Modified Rankin (Stroke Patients Only) Pre-Morbid Rankin Score: No symptoms Modified Rankin: Moderately severe disability     Balance Overall balance assessment: Needs assistance Sitting-balance support: Single extremity supported;Feet supported;No upper extremity supported Sitting balance-Leahy Scale: Fair     Standing balance support: Bilateral upper extremity supported Standing balance-Leahy Scale: Poor Standing balance comment: minA with BUE support of RW                            Cognition Arousal/Alertness: Awake/alert Behavior During Therapy: Flat affect Overall Cognitive Status: Difficult to assess Area of Impairment: Following commands;Attention;Safety/judgement;Awareness;Problem solving                   Current Attention Level: Sustained   Following Commands: Follows one step commands with increased time Safety/Judgement: Decreased awareness of safety;Decreased awareness of deficits   Problem Solving: Slow processing;Requires verbal cues;Requires tactile cues;Difficulty sequencing        Exercises      General Comments General comments (skin integrity, edema, etc.): VSS on RA  Pertinent Vitals/Pain Pain Assessment: Faces Faces Pain Scale: Hurts little more Pain Location: stomach Pain Descriptors / Indicators: Guarding Pain Intervention(s): Monitored during session    Home Living                      Prior  Function            PT Goals (current goals can now be found in the care plan section) Acute Rehab PT Goals Patient Stated Goal: unable to state Progress towards PT goals: Progressing toward goals    Frequency    Min 4X/week      PT Plan Current plan remains appropriate    Co-evaluation              AM-PAC PT "6 Clicks" Mobility   Outcome Measure  Help needed turning from your back to your side while in a flat bed without using bedrails?: A Little Help needed moving from lying on your back to sitting on the side of a flat bed without using bedrails?: A Little Help needed moving to and from a bed to a chair (including a wheelchair)?: A Little Help needed standing up from a chair using your arms (e.g., wheelchair or bedside chair)?: A Little Help needed to walk in hospital room?: A Lot Help needed climbing 3-5 steps with a railing? : Total 6 Click Score: 15    End of Session Equipment Utilized During Treatment: Gait belt Activity Tolerance: Patient tolerated treatment well Patient left: in chair;with call bell/phone within reach;with chair alarm set Nurse Communication: Mobility status PT Visit Diagnosis: Other abnormalities of gait and mobility (R26.89);Muscle weakness (generalized) (M62.81);Other symptoms and signs involving the nervous system (R29.898)     Time: 6599-3570 PT Time Calculation (min) (ACUTE ONLY): 23 min  Charges:  $Gait Training: 8-22 mins $Therapeutic Activity: 8-22 mins                     Zenaida Niece, PT, DPT Acute Rehabilitation Pager: [email or web address] 07/07/2020, 4:18 PM

## 2020-07-07 NOTE — Plan of Care (Signed)
  Problem: Education: Goal: Knowledge of disease or condition will improve Outcome: Progressing Goal: Knowledge of secondary prevention will improve Outcome: Progressing   Problem: Coping: Goal: Will verbalize positive feelings about self Outcome: Progressing   Problem: Nutrition: Goal: Risk of aspiration will decrease Outcome: Progressing   Problem: Ischemic Stroke/[name] Tissue Perfusion: Goal: Complications of ischemic stroke/[name] will be minimized Outcome: Progressing   Problem: Education: Goal: Knowledge of General Education information will improve Description: Including pain rating scale, medication(s)/side effects and non-pharmacologic comfort measures Outcome: Progressing   Problem: Health Behavior/Discharge Planning: Goal: Ability to manage health-related needs will improve Outcome: Progressing   Problem: Clinical Measurements: Goal: Ability to maintain clinical measurements within normal limits will improve Outcome: Progressing Goal: Will remain free from infection Outcome: Progressing Goal: Diagnostic test results will improve Outcome: Progressing Goal: Respiratory complications will improve Outcome: Progressing Goal: Cardiovascular complication will be avoided Outcome: Progressing   Problem: Activity: Goal: Risk for activity intolerance will decrease Outcome: Progressing   Problem: Nutrition: Goal: Adequate nutrition will be maintained Outcome: Progressing   Problem: Coping: Goal: Level of anxiety will decrease Outcome: Progressing   Problem: Elimination: Goal: Will not experience complications related to bowel motility Outcome: Progressing Goal: Will not experience complications related to urinary retention Outcome: Progressing   Problem: Pain Managment: Goal: General experience of comfort will improve Outcome: Progressing   Problem: Safety: Goal: Ability to remain free from injury will improve Outcome: Progressing   Problem: Skin  Integrity: Goal: Risk for impaired skin integrity will decrease Outcome: Progressing

## 2020-07-07 NOTE — Progress Notes (Signed)
Inpatient Rehabilitation-Admissions Coordinator   I do not have a bed available in CIR today for this patient. Will follow up tomorrow for possible admit, pending bed availability.   Raechel Ache, OTR/L  Rehab Admissions Coordinator  [phone] 07/07/2020 11:02 AM

## 2020-07-08 DIAGNOSIS — D509 Iron deficiency anemia, unspecified: Secondary | ICD-10-CM | POA: Diagnosis not present

## 2020-07-08 DIAGNOSIS — E1169 Type 2 diabetes mellitus with other specified complication: Secondary | ICD-10-CM

## 2020-07-08 DIAGNOSIS — N189 Chronic kidney disease, unspecified: Secondary | ICD-10-CM | POA: Diagnosis not present

## 2020-07-08 DIAGNOSIS — I69391 Dysphagia following cerebral infarction: Secondary | ICD-10-CM

## 2020-07-08 DIAGNOSIS — N1832 Chronic kidney disease, stage 3b: Secondary | ICD-10-CM

## 2020-07-08 DIAGNOSIS — N179 Acute kidney failure, unspecified: Secondary | ICD-10-CM

## 2020-07-08 DIAGNOSIS — E785 Hyperlipidemia, unspecified: Secondary | ICD-10-CM

## 2020-07-08 LAB — GLUCOSE, CAPILLARY
Glucose-Capillary: 83 mg/dL (ref 70–99)
Glucose-Capillary: 84 mg/dL (ref 70–99)

## 2020-07-08 MED ORDER — OSMOLITE 1.2 CAL PO LIQD: 1000.0000 mL | ORAL | 0 refills | Status: DC

## 2020-07-08 MED ORDER — ATORVASTATIN CALCIUM 80 MG PO TABS: 80.0000 mg | ORAL_TABLET | Freq: Every day | ORAL | Status: DC

## 2020-07-08 MED ORDER — ADULT MULTIVITAMIN W/MINERALS CH: 1.0000 | ORAL_TABLET | Freq: Two times a day (BID) | ORAL | Status: DC

## 2020-07-08 MED ORDER — PROSOURCE TF PO LIQD: 45.0000 mL | Freq: Two times a day (BID) | ORAL | Status: DC

## 2020-07-08 MED ORDER — POLYETHYLENE GLYCOL 3350 17 GM/SCOOP PO POWD: 17.0000 g | Freq: Two times a day (BID) | ORAL | 0 refills | Status: DC | PRN

## 2020-07-08 MED ORDER — LEVETIRACETAM 500 MG PO TABS
500.0000 mg | ORAL_TABLET | Freq: Two times a day (BID) | ORAL | Status: DC
Start: 2020-07-08 — End: 2020-07-26

## 2020-07-08 MED ORDER — SENNOSIDES-DOCUSATE SODIUM 8.6-50 MG PO TABS: 1.0000 | ORAL_TABLET | Freq: Two times a day (BID) | ORAL | 0 refills | Status: DC | PRN

## 2020-07-08 MED ORDER — PANTOPRAZOLE SODIUM 40 MG PO TBEC: 40.0000 mg | DELAYED_RELEASE_TABLET | Freq: Every day | ORAL | 0 refills | Status: DC

## 2020-07-08 MED ORDER — ENSURE ENLIVE PO LIQD: 237.0000 mL | Freq: Two times a day (BID) | ORAL | 12 refills | Status: DC

## 2020-07-08 MED ORDER — BETHANECHOL CHLORIDE 10 MG PO TABS
10.0000 mg | ORAL_TABLET | Freq: Three times a day (TID) | ORAL | Status: DC
  Administered 2020-07-08 – 2020-07-09 (×4): 10 mg
  Filled 2020-07-08 (×4): qty 1

## 2020-07-08 MED ORDER — ASPIRIN 81 MG PO CHEW: 81.0000 mg | CHEWABLE_TABLET | Freq: Every day | ORAL | Status: DC

## 2020-07-08 MED ORDER — CALCIUM CITRATE 950 (200 CA) MG PO TABS: 200.0000 mg | ORAL_TABLET | Freq: Three times a day (TID) | ORAL | Status: DC

## 2020-07-08 MED ORDER — VITAMIN D3 25 MCG PO TABS: 1000.0000 [IU] | ORAL_TABLET | Freq: Every day | ORAL | Status: DC

## 2020-07-08 MED ORDER — FREE WATER: 200.0000 mL | Status: DC

## 2020-07-08 MED ORDER — ALBUTEROL SULFATE (2.5 MG/3ML) 0.083% IN NEBU: 2.5000 mg | INHALATION_SOLUTION | Freq: Four times a day (QID) | RESPIRATORY_TRACT | 12 refills | Status: DC | PRN

## 2020-07-08 MED ORDER — CHLORHEXIDINE GLUCONATE 0.12% ORAL RINSE (MEDLINE KIT): 15.0000 mL | Freq: Two times a day (BID) | OROMUCOSAL | 0 refills | Status: DC

## 2020-07-08 MED ORDER — ACETAMINOPHEN 325 MG PO TABS: 650.0000 mg | ORAL_TABLET | Freq: Four times a day (QID) | ORAL | Status: DC | PRN

## 2020-07-08 MED ORDER — BETHANECHOL CHLORIDE 10 MG PO TABS: 10.0000 mg | ORAL_TABLET | Freq: Three times a day (TID) | ORAL | Status: DC

## 2020-07-08 MED ORDER — TICAGRELOR 90 MG PO TABS: 90.0000 mg | ORAL_TABLET | Freq: Two times a day (BID) | ORAL | 1 refills | Status: DC

## 2020-07-08 NOTE — Discharge Summary (Signed)
Physician Discharge Summary  Courtney Burke DTO:[id number] DOB: 08-04-1955 DOA: 06/30/2020  PCP: [name], PA-C  Admit date: 06/30/2020 Discharge date: 07/08/2020  Admitted From: Home Disposition: CIR  Recommendations for Outpatient Follow-up:  1. Follow ups as below. 2. Please obtain CBC/BMP/Mag in 1 week 3. Please follow up on the following pending results: None   Discharge Condition: Stable CODE STATUS: Full code   Follow-up Information    Luanne Bras, MD Follow up in 3 month(s).   Specialties: Interventional Radiology, Radiology Why: Please plan to follow-up with Dr. Estanislado Pandy with CTA head/neck 3 months after discharge. Our office will call you to set up this imaging scan. Contact information: [place] [address] [phone]        Shirley Friar, PA-C Follow up.   Specialty: Physician Assistant Why: 08/04/20 @ 11:20AM, cardiology follow up to revisit heart rhythm monitoring Contact information: [address] [place] [address] [phone]                Hospital Course: 65 year old female with PMH of HTN, DM-2, CKD-3A, dysphagia, obesity s/p gastric sleeve in 05/2020 brought to ED by EMS as code stroke in the setting of altered mental status.  She was also seen with eye rolling back in her head and brief LOC x2.  Found to have right-sided weakness on presentation.  CVA work-up revealed left MCA CVA likely due to M1 segment occlusion.  She underwent thrombectomy and stent placement by interventional radiologist on 06/30/2020.  Neurology recommended loop recorder.  Cardiology consulted for this.  SLP upgraded her to dysphagia 2 diet.  Still on tube feed via Cortrak as she couldn't meet adequate caloric intake.  Has been accepted to CIR pending bed.  Follow-ups as above.  See individual problem list below for more on hospital course.  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 -S/p left M1 thrombectomy with stent placement on 1/27 -Continue statin, DAPT with aspirin and Brilinta -Continue Keppra for seizure like activity prior to admission -Upgraded to dysphagia 2 diet but also on tube feed via Cortrak as she is yet to meet adequate calorie intake -Follow therapy CIR -Recommend dietitian follow-up at [facility] follow-up with cardiology for event monitor as above. -Outpatient follow-up with interventional radiology as above  Dysphagia likely due to CVA: seems to have some element of chronic dysphagia based on chart review. Cortrak in place for tube feed as she is yet to meet adequate calorie intake orally.  -Continue dysphagia 2 diet per SLP -Continue TF.  -SLP and dietitian follow-up of CIR  Essential hypertension: BP within normal range lately. -Continue monitoring  History of DM-2?: A1c 5.4%.  Does not appear to be on diabetic medication. -Stopped SSI and CBG monitoring -Continue Lipitor.  ABLA/iron deficiency anemia: H&H relatively stable.  Anemia panel consistent with iron deficiency Recent Labs    06/30/20 0719 06/30/20 1405 07/01/20 0513 07/02/20 0715 07/02/20 1341 07/03/20 0534 07/04/20 0707 07/05/20 0252 07/06/20 0445 07/07/20 0348  HGB 11.2* 9.9* 9.1* 7.9* 8.2* 8.5* 8.2* 9.2* 8.9* 10.3*  -Received IV Feraheme on 2/2.   Consider additional IV iron in 1 week -Recheck CBC in 1 week -Continue monitoring  AKI on CKD-3A: AKI resolved. Recent Labs    06/30/20 0710 06/30/20 0719 07/01/20 9767 07/02/20 0715 07/03/20 0534 07/04/20 0707 07/05/20 0252 07/06/20 0445 07/07/20 0348  BUN _0 11  _0 CREATININE 1.41* 1.30* 1.44* 1.47* 1.26* 1.16* 1.20* 1.21* 1.25*  -Recheck BMP in 1 week  Acute urinary retention: Resolved.  Passed voiding trial. -Continue reduced dose of bethanechol at 10 mg 3 times daily for 1 week -Monitor urine  output  Hypokalemia: Resolved. -Recheck BMP in 1 week   Morbid obesity-s/p gastric sleeve on 05/2020. Body mass index is 36.81 kg/m. Nutrition Problem: Increased nutrient needs Etiology: dysphagia Signs/Symptoms: estimated needs Interventions: Ensure Enlive (each supplement provides 350kcal and 20 grams of protein),Magic cup      Discharge Exam: Vitals:   07/08/20 0438 07/08/20 0842  BP: 121/72 111/64  Pulse: 72 84  Resp: 18 16  Temp: 98.9 F (37.2 C) 97.7 F (36.5 C)  SpO2: 98% 97%    GENERAL: No apparent distress.  Nontoxic. HEENT: MMM.  Vision and hearing grossly intact.  NECK: Supple.  No apparent JVD.  RESP: On RA.  No IWOB.  Fair aeration bilaterally. CVS:  RRR. Heart sounds normal.  ABD/GI/GU: Bowel sounds present. Soft. Non tender.  MSK/EXT:  Moves extremities. No apparent deformity. No edema.  SKIN: no apparent skin lesion or wound NEURO: Awake and alert.  Somewhat grimacing but nods no to pain.  Severe expressive aphasia.  Follows commands.  Mild residual right hemiparesis.   Discharge Instructions  Discharge Instructions    Diet - low sodium heart healthy   Complete by: As directed    Dysphagia-2 diet   Increase activity slowly   Complete by: As directed      Allergies as of 07/08/2020      Reactions   Iodinated Diagnostic Agents Itching   Doxycycline Nausea And Vomiting   Sulfa Antibiotics Rash, Hives   Codeine Hives, Swelling   Swollen tongue   Hydrocodone-homatropine Itching   Ace Inhibitors Cough, Itching, Rash      Medication List    STOP taking these medications   naproxen sodium 220 MG tablet Commonly known as: ALEVE     TAKE these medications   acetaminophen 325 MG tablet Commonly known as: TYLENOL Take 2 tablets (650 mg total) by mouth every 6 (six) hours as needed for mild pain, fever or headache (or temp > 37.5 C (99.5 F)). What changed:   medication strength  how much to take  reasons to take this   albuterol (2.5  MG/3ML) 0.083% nebulizer solution Commonly known as: PROVENTIL Take 3 mLs (2.5 mg total) by nebulization every 6 (six) hours as needed for wheezing or shortness of breath.   aspirin 81 MG chewable tablet Chew 1 tablet (81 mg total) by mouth daily. Start taking on: July 09, 2020   atorvastatin 80 MG tablet Commonly known as: LIPITOR Take 1 tablet (80 mg total) by mouth at bedtime.   bethanechol 10 MG tablet Commonly known as: URECHOLINE Take 1 tablet (10 mg total) by mouth 3 (three) times daily.   calcium citrate 950 (200 Ca) MG tablet Commonly known as: CALCITRATE - dosed in mg elemental calcium Take 1 tablet (200 mg of elemental calcium total) by mouth 3 (three) times daily.   chlorhexidine gluconate (MEDLINE KIT) 0.12 % solution Commonly known as: PERIDEX 15 mLs by Mouth Rinse route 2 (two) times daily.   feeding supplement Liqd Take 237 mLs by mouth 2 (two) times daily between meals.   feeding supplement (OSMOLITE 1.2 CAL) Liqd Place 1,000 mLs into feeding tube continuous.   feeding supplement (PROSource TF) liquid Place 45 mLs into feeding tube 2 (  two) times daily.   free water Soln Place 200 mLs into feeding tube every 4 (four) hours.   levETIRAcetam 500 MG tablet Commonly known as: Keppra Take 1 tablet (500 mg total) by mouth 2 (two) times daily.   multivitamin with minerals Tabs tablet Take 1 tablet by mouth 2 (two) times daily.   pantoprazole 40 MG tablet Commonly known as: Protonix Take 1 tablet (40 mg total) by mouth daily.   polyethylene glycol powder 17 GM/SCOOP powder Commonly known as: MiraLax Take 17 g by mouth 2 (two) times daily as needed for moderate constipation.   senna-docusate 8.6-50 MG tablet Commonly known as: Senokot-S Take 1 tablet by mouth 2 (two) times daily between meals as needed for mild constipation.   ticagrelor 90 MG Tabs tablet Commonly known as: BRILINTA Take 1 tablet (90 mg total) by mouth 2 (two) times daily.    Vitamin D3 25 MCG tablet Commonly known as: Vitamin D Take 1 tablet (1,000 Units total) by mouth daily before breakfast. Start taking on: July 09, 2020       Consultations:  Neurology  Interventional radiology  Cardiology  Procedures/Studies:  06/30/2020-left M1 thrombectomy and stent placement.  TTE on 07/01/2020 1. Left ventricular ejection fraction, by estimation, is 60 to 65%. The  left ventricle has normal function. The left ventricle has no regional  wall motion abnormalities. There is moderate left ventricular hypertrophy.  Left ventricular diastolic  parameters are consistent with Grade I diastolic dysfunction (impaired  relaxation).  2. Right ventricular systolic function is normal. The right ventricular  size is normal. There is normal pulmonary artery systolic pressure. The  estimated right ventricular systolic pressure is 83.7 mmHg.  3. The mitral valve is grossly normal. Trivial mitral valve  regurgitation.  4. The aortic valve is tricuspid. Aortic valve regurgitation is not  visualized.  5. The inferior vena cava is normal in size with greater than 50%  respiratory variability, suggesting right atrial pressure of 3 mmHg.     CT ABDOMEN PELVIS WO CONTRAST  Result Date: 07/02/2020 CLINICAL DATA:  Retroperitoneal hematoma.  Decreased hemoglobin. EXAM: CT ABDOMEN AND PELVIS WITHOUT CONTRAST TECHNIQUE: Multidetector CT imaging of the abdomen and pelvis was performed following the standard protocol without IV contrast. COMPARISON:  CT dated 07/14/2019 FINDINGS: Lower chest: There is atelectasis versus aspiration at the lung bases.The heart is enlarged. The intracardiac blood pool is hypodense relative to the adjacent myocardium consistent with anemia. Hepatobiliary: The liver is normal. Status post cholecystectomy.There is no biliary ductal dilation. Pancreas: Normal contours without ductal dilatation. No peripancreatic fluid collection. Spleen: Unremarkable.  Adrenals/Urinary Tract: --Adrenal glands: Unremarkable. --Right kidney/ureter: No hydronephrosis or radiopaque kidney stones. --Left kidney/ureter: No hydronephrosis or radiopaque kidney stones. --Urinary bladder: There is a Foley catheter in place. Stomach/Bowel: --Stomach/Duodenum: Patient is status post prior gastric bypass. The enteric tube terminates in the proximal small bowel. --Small bowel: Unremarkable. --Colon: There is a large amount of stool at the level of the rectum. --Appendix: Not visualized. No right lower quadrant inflammation or free fluid. Vascular/Lymphatic: There is fat stranding in the right inguinal region with a small retroperitoneal hematoma on the right. --No retroperitoneal lymphadenopathy. --No mesenteric lymphadenopathy. --No pelvic or inguinal lymphadenopathy. Reproductive: Unremarkable Other: No ascites or free air. There is a small fat containing umbilical hernia. Musculoskeletal. No acute displaced fractures. IMPRESSION: 1. Fat stranding in the right inguinal region with a small retroperitoneal hematoma on the right. 2. Cardiomegaly. 3. Anemia. 4. Bibasilar atelectasis versus aspiration. 5. Large  amount of stool at the level of the rectum. Electronically Signed   By: Constance Holster M.D.   On: 07/02/2020 18:17   CT Code Stroke CTA Head W/WO contrast  Result Date: 06/30/2020 CLINICAL DATA:  65 year old female code stroke presentation with left MCA ASPECTS 6. Left gaze deviation. EXAM: CT ANGIOGRAPHY HEAD AND NECK CT PERFUSION BRAIN TECHNIQUE: Multidetector CT imaging of the head and neck was performed using the standard protocol during bolus administration of intravenous contrast. Multiplanar CT image reconstructions and MIPs were obtained to evaluate the vascular anatomy. Carotid stenosis measurements (when applicable) are obtained utilizing NASCET criteria, using the distal internal carotid diameter as the denominator. Multiphase CT imaging of the brain was performed  following IV bolus contrast injection. Subsequent parametric perfusion maps were calculated using RAPID software. CONTRAST:  154mL OMNIPAQUE IOHEXOL 350 MG/ML SOLN COMPARISON:  Plain head CT today 0721 hours. [facility] brain MRI 02/15/2016 FINDINGS: CT Brain Perfusion Findings: ASPECTS: 6 CBF (<30%) Volume: 62mL (erroneous). Using CBF less than 38% 11 mL of parenchyma is detected which does partially correspond to the area of cytotoxic edema by plain CT. Perfusion (Tmax>6.0s) volume: 42mL, hypoperfusion index 0.1 but also might be spurrious. Mismatch Volume: Calculated is not accurate due to erroneously low infarct core, estimated penumbra given the above is 60 to 67 mL. Infarction Location:Left MCA The above was discussed by telephone with Dr. Lesleigh Noe on 06/30/2020 at 07:49 . CTA NECK Skeleton: No acute osseous abnormality identified. Dystrophic/degenerative calcifications of the longus coli muscle insertion at C1-C2. Upper chest: Negative. Other neck: No acute finding. Incidental contrast reflux into a left posterior neck vein. Aortic arch: Slightly bovine arch configuration. No arch atherosclerosis. Right carotid system: Mildly tortuous proximal right CCA. Moderate calcified plaque at the right ICA origin with less than 50 % stenosis with respect to the distal vessel. Mild tortuosity. Left carotid system: Patent, mildly tortuous left CCA. Mild to moderate calcified plaque at the posterior left ICA origin with less than 50 % stenosis with respect to the distal vessel. Mildly tortuous. Vertebral arteries: Negative proximal right subclavian artery and right vertebral artery origin. The right vertebral is mildly tortuous but patent to the skull base without stenosis. Moderate soft more than calcified plaque in the proximal left subclavian artery although no significant stenosis. Left vertebral artery origin remains normal. Codominant left vertebral artery is patent to  the skull base without stenosis. CTA HEAD Posterior circulation: Mild right V4 calcified plaque. Patent distal vertebral arteries to the basilar with mild stenosis at the vertebrobasilar junction. Diminutive PICA. Left AICA appears dominant. Patent basilar artery is diminutive but without stenosis. Patent SCA origins with fetal type bilateral PCA origins. Patent basilar tip. Bilateral PCA branches are within normal limits. Anterior circulation: Both ICA siphons are patent. No significant siphon plaque or stenosis. Normal posterior communicating artery origins. Patent carotid termini although the left MCA origin is occluded (series 10, image 19). There is some left MCA branch reconstitution as seen on series 13, image 43. Left ACA origin is normal. There is severe stenosis at the right ACA origin. Anterior communicating artery is diminutive. Bilateral A2 branches appear symmetric and within normal limits. Right MCA origin is patent but also irregular with mild stenosis. Right MCA M1 and right MCA bifurcation are patent, although with moderate stenosis at the posterior right MCA M2 origin (series 12, image 10). No right MCA branch occlusion identified. Venous sinuses: Early contrast timing, grossly patent. Anatomic variants:  Fetal type PCA origins. Review of the MIP images confirms the above findings IMPRESSION: 1. Positive for emergent large vessel occlusion: Left MCA origin. Some left MCA branch reconstitution. 2. CT Perfusion underestimates infarct core (ASPECTS 6), which is estimated at 11-22 mL, subsequent estimated penumbra of 60 to 67 mL. 3. No other large vessel occlusion. But intracranial atherosclerosis with: - Severe stenosis Right ACA origin. - moderate stenosis Right MCA M2 branch origin. - mild stenosis Right MCA M1, also Vertebrobasilar junction. 4. Cervical carotid atherosclerosis without stenosis. #1 discussed by telephone with Dr. Lesleigh Noe on 06/30/2020 at 0732 hours. And [facility] CTP findings  discussed at 0749 hours. Electronically Signed   By: Genevie Ann M.D.   On: 06/30/2020 07:58   CT HEAD WO CONTRAST  Result Date: 07/02/2020 CLINICAL DATA:  Stroke follow-up.  Intracranial hemorrhage. EXAM: CT HEAD WITHOUT CONTRAST TECHNIQUE: Contiguous axial images were obtained from the base of the skull through the vertex without intravenous contrast. COMPARISON:  CT head 06/30/2020, MRI head 07/01/2020 FINDINGS: Brain: Left MCA infarct with hypodensity in the left temporal and parietal lobe as well as in the left basal ganglia involving the caudate and putamen. Small 1 cm hemorrhage left temporal lobe unchanged from prior CT and MRI. Small focus of subarachnoid hemorrhage in the left lateral temporal lobe which was present on the prior CT. Ventricle size normal. No midline shift. No new area of infarction. Vascular: Negative for hyperdense vessel Skull: Negative Sinuses/Orbits: Paranasal sinuses clear. NG tube in place. Negative orbit. Other: None IMPRESSION: Acute left MCA infarct unchanged from prior studies. Small amount of hemorrhage left temporal lobe also unchanged. No new area of hemorrhage or infarction. Electronically Signed   By: Franchot Gallo M.D.   On: 07/02/2020 17:49   CT HEAD WO CONTRAST  Result Date: 06/30/2020 CLINICAL DATA:  Stroke, left MCA occlusion post thrombectomy and stenting EXAM: CT HEAD WITHOUT CONTRAST TECHNIQUE: Contiguous axial images were obtained from the base of the skull through the vertex without intravenous contrast. COMPARISON:  Earlier same day FINDINGS: Brain: Probable few patchy areas of loss of gray-white differentiation are identified in the left MCA territory. This is less apparent than on the prior study. There is some ill-defined patchy hyperdensity, most discrete in the superior left temporal lobe on axial series 3, image 14. There is no significant mass effect. No hydrocephalus. Small chronic right cerebellar infarct. Vascular: New stent of the left M1 MCA.  Skull: Calvarium is unremarkable. Sinuses/Orbits: No acute finding. Other: None. IMPRESSION: Probable few small areas of acute left MCA infarction as before but less apparent. Ill-defined patchy hyperdensity may reflect contrast staining and/or reperfusion petechial hemorrhage. No significant mass effect. Electronically Signed   By: Macy Mis M.D.   On: 06/30/2020 16:41   CT Code Stroke CTA Neck W/WO contrast  Result Date: 06/30/2020 CLINICAL DATA:  65 year old female code stroke presentation with left MCA ASPECTS 6. Left gaze deviation. EXAM: CT ANGIOGRAPHY HEAD AND NECK CT PERFUSION BRAIN TECHNIQUE: Multidetector CT imaging of the head and neck was performed using the standard protocol during bolus administration of intravenous contrast. Multiplanar CT image reconstructions and MIPs were obtained to evaluate the vascular anatomy. Carotid stenosis measurements (when applicable) are obtained utilizing NASCET criteria, using the distal internal carotid diameter as the denominator. Multiphase CT imaging of the brain was performed following IV bolus contrast injection. Subsequent parametric perfusion maps were calculated using RAPID software. CONTRAST:  185m OMNIPAQUE IOHEXOL 350 MG/ML SOLN COMPARISON:  Plain head CT  today 0721 hours. [facility] brain MRI 02/15/2016 FINDINGS: CT Brain Perfusion Findings: ASPECTS: 6 CBF (<30%) Volume: 6mL (erroneous). Using CBF less than 38% 11 mL of parenchyma is detected which does partially correspond to the area of cytotoxic edema by plain CT. Perfusion (Tmax>6.0s) volume: 33mL, hypoperfusion index 0.1 but also might be spurrious. Mismatch Volume: Calculated is not accurate due to erroneously low infarct core, estimated penumbra given the above is 60 to 67 mL. Infarction Location:Left MCA The above was discussed by telephone with Dr. Lesleigh Noe on 06/30/2020 at 07:49 . CTA NECK Skeleton: No acute osseous abnormality identified.  Dystrophic/degenerative calcifications of the longus coli muscle insertion at C1-C2. Upper chest: Negative. Other neck: No acute finding. Incidental contrast reflux into a left posterior neck vein. Aortic arch: Slightly bovine arch configuration. No arch atherosclerosis. Right carotid system: Mildly tortuous proximal right CCA. Moderate calcified plaque at the right ICA origin with less than 50 % stenosis with respect to the distal vessel. Mild tortuosity. Left carotid system: Patent, mildly tortuous left CCA. Mild to moderate calcified plaque at the posterior left ICA origin with less than 50 % stenosis with respect to the distal vessel. Mildly tortuous. Vertebral arteries: Negative proximal right subclavian artery and right vertebral artery origin. The right vertebral is mildly tortuous but patent to the skull base without stenosis. Moderate soft more than calcified plaque in the proximal left subclavian artery although no significant stenosis. Left vertebral artery origin remains normal. Codominant left vertebral artery is patent to the skull base without stenosis. CTA HEAD Posterior circulation: Mild right V4 calcified plaque. Patent distal vertebral arteries to the basilar with mild stenosis at the vertebrobasilar junction. Diminutive PICA. Left AICA appears dominant. Patent basilar artery is diminutive but without stenosis. Patent SCA origins with fetal type bilateral PCA origins. Patent basilar tip. Bilateral PCA branches are within normal limits. Anterior circulation: Both ICA siphons are patent. No significant siphon plaque or stenosis. Normal posterior communicating artery origins. Patent carotid termini although the left MCA origin is occluded (series 10, image 19). There is some left MCA branch reconstitution as seen on series 13, image 43. Left ACA origin is normal. There is severe stenosis at the right ACA origin. Anterior communicating artery is diminutive. Bilateral A2 branches appear symmetric and  within normal limits. Right MCA origin is patent but also irregular with mild stenosis. Right MCA M1 and right MCA bifurcation are patent, although with moderate stenosis at the posterior right MCA M2 origin (series 12, image 10). No right MCA branch occlusion identified. Venous sinuses: Early contrast timing, grossly patent. Anatomic variants: Fetal type PCA origins. Review of the MIP images confirms the above findings IMPRESSION: 1. Positive for emergent large vessel occlusion: Left MCA origin. Some left MCA branch reconstitution. 2. CT Perfusion underestimates infarct core (ASPECTS 6), which is estimated at 11-22 mL, subsequent estimated penumbra of 60 to 67 mL. 3. No other large vessel occlusion. But intracranial atherosclerosis with: - Severe stenosis Right ACA origin. - moderate stenosis Right MCA M2 branch origin. - mild stenosis Right MCA M1, also Vertebrobasilar junction. 4. Cervical carotid atherosclerosis without stenosis. #1 discussed by telephone with Dr. Lesleigh Noe on 06/30/2020 at 0732 hours. And [facility] CTP findings discussed at 0749 hours. Electronically Signed   By: Genevie Ann M.D.   On: 06/30/2020 07:58   MR ANGIO HEAD WO CONTRAST  Result Date: 07/01/2020 CLINICAL DATA:  Stroke.  Post left MCA thrombectomy. EXAM: MRI HEAD WITHOUT  CONTRAST MRA HEAD WITHOUT CONTRAST TECHNIQUE: Multiplanar, multiecho pulse sequences of the brain and surrounding structures were obtained without intravenous contrast. Angiographic images of the head were obtained using MRA technique without contrast. COMPARISON:  CT head 06/30/2020.  CTA head 06/30/2020 FINDINGS: MRI HEAD FINDINGS Brain: Acute infarct left MCA territory. There is involvement of the left basal ganglia including the caudate and putamen. There is infarct involving the superior left temporal lobe as well as the insula and the left parietal lobe. Small area of hemorrhage is present within the left posterior temporal lobe measuring approximately 1 cm.  This was hyperdense on CT yesterday. Scattered small areas of acute infarct in the left frontal lobe. Small area of acute infarct in the right cerebellum and in the occipital white matter bilaterally. Ventricle size normal.  No midline shift.  No mass lesion. Vascular: Normal arterial flow voids Skull and upper cervical spine: Negative Sinuses/Orbits: Mild mucosal edema paranasal sinuses. Negative orbit Other: None MRA HEAD FINDINGS Decreased signal in the carotid bilaterally at the skull base is felt to be artifact. This area appears widely patent on CTA. Decreased signal left cavernous carotid also artifact. Both cavernous carotids are widely patent on CTA. Moderate stenosis proximal right A1 segment. Mild stenosis right M1. Moderate stenosis right MCA bifurcation. Interval placement of stent in the left middle cerebral artery. There is flow related signal in left MCA branches indicating the stent is patent. Both vertebral arteries patent to the basilar. Basilar widely patent. Right PICA patent. Left AICA patent. Superior cerebellar and posterior cerebral arteries patent bilaterally. Fetal origin left posterior cerebral artery. Right posterior communicating artery is patent. IMPRESSION: Acute infarct left MCA territory involving the basal ganglia as well as the left temporal, frontal, and parietal lobes. Small area of hemorrhage in the left posterior temporal lobe. Additional small areas of acute infarct in the occipital white matter bilaterally and right cerebellum. Left MCA stent appears patent. Moderate intracranial atherosclerotic disease as above. Electronically Signed   By: Franchot Gallo M.D.   On: 07/01/2020 18:55   MR BRAIN WO CONTRAST  Result Date: 07/01/2020 CLINICAL DATA:  Stroke.  Post left MCA thrombectomy. EXAM: MRI HEAD WITHOUT CONTRAST MRA HEAD WITHOUT CONTRAST TECHNIQUE: Multiplanar, multiecho pulse sequences of the brain and surrounding structures were obtained without intravenous contrast.  Angiographic images of the head were obtained using MRA technique without contrast. COMPARISON:  CT head 06/30/2020.  CTA head 06/30/2020 FINDINGS: MRI HEAD FINDINGS Brain: Acute infarct left MCA territory. There is involvement of the left basal ganglia including the caudate and putamen. There is infarct involving the superior left temporal lobe as well as the insula and the left parietal lobe. Small area of hemorrhage is present within the left posterior temporal lobe measuring approximately 1 cm. This was hyperdense on CT yesterday. Scattered small areas of acute infarct in the left frontal lobe. Small area of acute infarct in the right cerebellum and in the occipital white matter bilaterally. Ventricle size normal.  No midline shift.  No mass lesion. Vascular: Normal arterial flow voids Skull and upper cervical spine: Negative Sinuses/Orbits: Mild mucosal edema paranasal sinuses. Negative orbit Other: None MRA HEAD FINDINGS Decreased signal in the carotid bilaterally at the skull base is felt to be artifact. This area appears widely patent on CTA. Decreased signal left cavernous carotid also artifact. Both cavernous carotids are widely patent on CTA. Moderate stenosis proximal right A1 segment. Mild stenosis right M1. Moderate stenosis right MCA bifurcation. Interval placement of stent in  the left middle cerebral artery. There is flow related signal in left MCA branches indicating the stent is patent. Both vertebral arteries patent to the basilar. Basilar widely patent. Right PICA patent. Left AICA patent. Superior cerebellar and posterior cerebral arteries patent bilaterally. Fetal origin left posterior cerebral artery. Right posterior communicating artery is patent. IMPRESSION: Acute infarct left MCA territory involving the basal ganglia as well as the left temporal, frontal, and parietal lobes. Small area of hemorrhage in the left posterior temporal lobe. Additional small areas of acute infarct in the  occipital white matter bilaterally and right cerebellum. Left MCA stent appears patent. Moderate intracranial atherosclerotic disease as above. Electronically Signed   By: Franchot Gallo M.D.   On: 07/01/2020 18:55   IR Intra Cran Stent  Result Date: 07/04/2020 INDICATION: Aphasia, left gaze deviation with right-sided weakness. 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 EXAM: 1. EMERGENT LARGE VESSEL OCCLUSION THROMBOLYSIS anterior CIRCULATION) COMPARISON:  CT angiogram of the head and neck of June 30, 2020. MEDICATIONS: Ancef 2 g IV antibiotic was administered within 1 hour of the procedure. ANESTHESIA/SEDATION: General anesthesia CONTRAST:  Isovue 300 approximately 120 mL FLUOROSCOPY TIME:  Fluoroscopy Time: 15 minutes 18 seconds (2667 mGy). COMPLICATIONS: None immediate. TECHNIQUE: Following a full explanation of the procedure along with the potential associated complications, an informed witnessed consent was obtained from the patient's daughter. The risks of intracranial hemorrhage of 10%, worsening neurological deficit, ventilator dependency, death and inability to revascularize were all reviewed in detail with the patient's daughter. The patient was then put under general anesthesia by the Department of Anesthesiology at [facility]. The right groin was prepped and draped in the usual sterile fashion. Thereafter using modified Seldinger technique, transfemoral access into the right common femoral artery was obtained without difficulty. Over a 0.035 inch guidewire an 8 [place] from 25 cm Pinnacle sheath was inserted. Through this, and also over a 0.035 inch guidewire a combination of Simmons 2 5.5 [place] support catheter inside of 087 balloon guide catheter combination was advanced to the aortic arch, and cannulation was performed of the left common carotid artery. Over a 0.035 inch glide guidewire, the support catheter, and the 087 balloon guidewire  were advanced to the left common carotid artery bifurcation. The guidewire and support Simmons 2 were removed. Good aspiration obtained from the hub of the balloon guide catheter. Control arteriogram performed through the balloon guide in the left common carotid artery was performed. FINDINGS: The left common carotid arteriogram demonstrates the left external carotid artery and its major branches to be widely patent. The left internal carotid artery at the bulb to the cranial skull base demonstrates wide patency with moderate tortuosity in its mid cervical segment. The petrous, cavernous and supraclinoid segments demonstrate wide patency. 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 The left anterior cerebral artery demonstrates approximately 50% stenosis at its A1 segment. However, flow is noted distally into the left anterior cerebral artery distribution. Flash filling of the left posterior communicating artery is also demonstrated. PROCEDURE: Through the balloon guide catheter in the proximal left internal carotid artery, a combination of an 014 inch standard Synchro micro guidewire with an 021 Headway microcatheter and an 071 136 cm Zoom aspiration catheter was advanced as the combination to the supraclinoid left ICA. The balloon guide was advanced further distally into the distal cervical left ICA. Using a torque device, access was obtained into the occluded  left middle cerebral artery into the inferior division branch M2 M3 region followed by the microcatheter. The guidewire was removed. Good aspiration obtained from the tip of the microcatheter. A gentle control arteriogram performed through microcatheter demonstrated safe position of the tip of the microcatheter which was now connected to continuous heparinized saline infusion. An [place] retrieval device was then advanced to the distal end of the microcatheter. The O ring on the delivery microcatheter was loosened.  The Tiger retrieval device was retrieved and deployed such that the proximal marker was just inside the proximal portion of the occluded left middle cerebral artery. The 071 aspiration catheter was advanced to just proximal to the origin of the right middle cerebral artery. Thereafter the retrieval device was expanded and decreased in size multiple times. 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 The retrieval device was again expanded right at the proximal aspect of the occlusion. Microcatheter was locked into position. Thereafter as constant aspiration was applied at the hub of the aspiration catheter at the origin of the left middle cerebral artery, and with the a 20 mL syringe at the hub of the balloon guide catheter in the left internal carotid artery with proximal occlusion for about 2 minutes, the combination of the retrieval device, the microcatheter and the Zoom aspiration device were retrieved and removed. Following reversal of flow arrest, control arteriogram performed through the balloon guide in the left internal carotid artery now demonstrated complete revascularization of the left middle cerebral artery distribution with a TICI 3 revascularization. However, this also unmasked a severe high-grade stenosis at the left MCA origin. At this point through the balloon guide in the left internal carotid artery, a combination of a 5 [place] Catalyst guide catheter inside of which was the 021 microcatheter was advanced over a 0.014 inch standard Synchro micro guidewire to the supraclinoid left ICA. At this time there was complete occlusion of the left middle cerebral artery due to recoil phenomenon due to the atherosclerotic plaque. Micro guidewire was then gently advanced through the occluded left middle cerebral artery without  difficulty in the superior division followed by the microcatheter. The guidewire was removed. Good aspiration obtained from the hub of the microcatheter. A gentle control arteriogram performed through this demonstrated safe position of the tip of the microcatheter. This in turn was then replaced with an 014 inch supported 300 cm Synchro micro guidewire with a J-tip configuration under constant fluoroscopic guidance. The tip of the exchange micro guidewire was maintained as the microcatheter was removed. Measurements were then performed of the left middle cerebral artery in its most normal segment just distal to the occlusion. It was elected to proceed with placement of a 2.25 mm x 12 mm Synergy drug-eluting stent. This was a retrogradely prepped with heparinized saline infusion, and also with 50% contrast and 50% heparinized saline infusion. Using the rapid exchange technique, the stent delivery system was advanced without difficulty to the supraclinoid left ICA. The delivery of the stent was then advanced without difficulty through the occluded left middle cerebral artery. Its proximal marker was positioned just proximal to the occluded left middle cerebral artery proximally. Thereafter, a control inflation was then performed using micro inflation syringe device via micro tubing with the balloon inflated to approximately 2.1 mm where it was maintained for approximately 20 seconds. Thereafter, with the wire distal, the balloon was retrieved  and removed. A control arteriogram performed through the 5 [place] guide catheter in the left internal carotid artery demonstrated complete angiographic revascularization of the left middle cerebral artery distribution achieving a TICI 3 revascularization. Also noted now was patency of the left posterior cerebral artery distribution via the posterior communicating artery. Control arteriograms were then performed at 15 and 30 minutes post deployment of the stent which continued to  demonstrate excellent flow through the stented segment also with patency of the superior and inferior division branches into the more distal M4 and M5 regions of the left middle cerebral artery distribution. Final control arteriogram performed through the balloon guide in the left internal carotid artery after removal of the 5 [place] Catalyst guide catheter, and also the exchange micro guidewire demonstrated continued excellent apposition and patency of the angioplastied segment of the left middle cerebral artery with no intra stent abnormalities. A distal perfusion was now noted into the M4 M5 regions. Moderate spasm at the middle cervical left ICA responded to 25 mcg of nitroglycerin intra-arterially. The left anterior cerebral artery remained widely patent with cross filling of the right anterior cerebral A2 segment as described earlier. Balloon guide was removed. The Pinnacle sheath was removed with successful hemostasis with an 8 French Angio-Seal closure device. Distal pulses remained Dopplerable in both feet unchanged. Patient was left intubated to protect her airway as per anesthesia. An immediate CT scan of the brain performed on the table demonstrated contrast stain in the left basal ganglia region, and also the left parietal subcortical area. Patient was loaded with aspirin 81 mg, and Brilinta 180 mg p.o. via an orogastric tube just prior to the balloon angioplasty. A loading dose of cangrelor IV was given right after placement of the stent, with a 4 hr low-dose infusion with a CT of the brain to follow. The patient's pupils were approximately 2 mm bilaterally though sluggish. The patient was then transferred to the PACU and then neuro ICU for post revascularization treatment. IMPRESSION: Status post endovascular complete revascularization of occluded left middle cerebral artery M1 segment with 1 pass with the Tiger 17 retrieval device and proximal aspiration followed by placement of a 2.25 mm x 12 mm  Synergy balloon mounted drug-eluting stent with achievement of a TICI 3 revascularization. PLAN: Follow-up in the clinic approximately 4 weeks post discharge. Electronically Signed   By: Luanne Bras M.D.   On: 07/01/2020 12:48   IR CT Head Ltd  Result Date: 07/04/2020 INDICATION: Aphasia, left gaze deviation with right-sided weakness. 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 EXAM: 1. EMERGENT LARGE VESSEL OCCLUSION THROMBOLYSIS anterior CIRCULATION) COMPARISON:  CT angiogram of the head and neck of June 30, 2020. MEDICATIONS: Ancef 2 g IV antibiotic was administered within 1 hour of the procedure. ANESTHESIA/SEDATION: General anesthesia CONTRAST:  Isovue 300 approximately 120 mL FLUOROSCOPY TIME:  Fluoroscopy Time: 15 minutes 18 seconds (2667 mGy). COMPLICATIONS: None immediate. TECHNIQUE: Following a full explanation of the procedure along with the potential associated complications, an informed witnessed consent was obtained from the patient's daughter. The risks of intracranial hemorrhage of 10%, worsening neurological deficit, ventilator dependency, death and inability to revascularize were all reviewed in detail with the patient's daughter. The patient was then put under general anesthesia by the Department of Anesthesiology at [facility]. The right groin was prepped and draped in the usual sterile fashion. Thereafter using modified Seldinger technique, transfemoral access into the right common femoral artery was obtained  without difficulty. Over a 0.035 inch guidewire an 8 [place] from 25 cm Pinnacle sheath was inserted. Through this, and also over a 0.035 inch guidewire a combination of Simmons 2 5.5 [place] support catheter inside of 087 balloon guide catheter combination was advanced to the aortic arch, and cannulation was performed of the left common carotid artery. Over a 0.035 inch glide guidewire, the support catheter, and the 087 balloon  guidewire were advanced to the left common carotid artery bifurcation. The guidewire and support Simmons 2 were removed. Good aspiration obtained from the hub of the balloon guide catheter. Control arteriogram performed through the balloon guide in the left common carotid artery was performed. FINDINGS: The left common carotid arteriogram demonstrates the left external carotid artery and its major branches to be widely patent. The left internal carotid artery at the bulb to the cranial skull base demonstrates wide patency with moderate tortuosity in its mid cervical segment. The petrous, cavernous and supraclinoid segments demonstrate wide patency. 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 The left anterior cerebral artery demonstrates approximately 50% stenosis at its A1 segment. However, flow is noted distally into the left anterior cerebral artery distribution. Flash filling of the left posterior communicating artery is also demonstrated. PROCEDURE: Through the balloon guide catheter in the proximal left internal carotid artery, a combination of an 014 inch standard Synchro micro guidewire with an 021 Headway microcatheter and an 071 136 cm Zoom aspiration catheter was advanced as the combination to the supraclinoid left ICA. The balloon guide was advanced further distally into the distal cervical left ICA. Using a torque device, access was obtained into the occluded left middle cerebral artery into the inferior division branch M2 M3 region followed by the microcatheter. The guidewire was removed. Good aspiration obtained from the tip of the microcatheter. A gentle control arteriogram performed through microcatheter demonstrated safe position of the tip of the microcatheter which was now connected to continuous heparinized saline infusion. An [place] retrieval device was then advanced to the distal end of the microcatheter. The O ring on the delivery microcatheter was  loosened. The Tiger retrieval device was retrieved and deployed such that the proximal marker was just inside the proximal portion of the occluded left middle cerebral artery. The 071 aspiration catheter was advanced to just proximal to the origin of the right middle cerebral artery. Thereafter the retrieval device was expanded and decreased in size multiple times. 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 The retrieval device was again expanded right at the proximal aspect of the occlusion. Microcatheter was locked into position. Thereafter as constant aspiration was applied at the hub of the aspiration catheter at the origin of the left middle cerebral artery, and with the a 20 mL syringe at the hub of the balloon guide catheter in the left internal carotid artery with proximal occlusion for about 2 minutes, the combination of the retrieval device, the microcatheter and the Zoom aspiration device were retrieved and removed. Following reversal of flow arrest, control arteriogram performed through the balloon guide in the left internal carotid artery now demonstrated complete revascularization of the left middle cerebral artery distribution with a TICI 3 revascularization. However, this also unmasked a severe high-grade stenosis at the left MCA origin. At this point through the balloon guide in the left internal carotid artery, a combination of  a 5 [place] Catalyst guide catheter inside of which was the 021 microcatheter was advanced over a 0.014 inch standard Synchro micro guidewire to the supraclinoid left ICA. At this time there was complete occlusion of the left middle cerebral artery due to recoil phenomenon due to the atherosclerotic plaque. Micro guidewire was then gently advanced through the occluded left middle cerebral artery  without difficulty in the superior division followed by the microcatheter. The guidewire was removed. Good aspiration obtained from the hub of the microcatheter. A gentle control arteriogram performed through this demonstrated safe position of the tip of the microcatheter. This in turn was then replaced with an 014 inch supported 300 cm Synchro micro guidewire with a J-tip configuration under constant fluoroscopic guidance. The tip of the exchange micro guidewire was maintained as the microcatheter was removed. Measurements were then performed of the left middle cerebral artery in its most normal segment just distal to the occlusion. It was elected to proceed with placement of a 2.25 mm x 12 mm Synergy drug-eluting stent. This was a retrogradely prepped with heparinized saline infusion, and also with 50% contrast and 50% heparinized saline infusion. Using the rapid exchange technique, the stent delivery system was advanced without difficulty to the supraclinoid left ICA. The delivery of the stent was then advanced without difficulty through the occluded left middle cerebral artery. Its proximal marker was positioned just proximal to the occluded left middle cerebral artery proximally. Thereafter, a control inflation was then performed using micro inflation syringe device via micro tubing with the balloon inflated to approximately 2.1 mm where it was maintained for approximately 20 seconds. Thereafter, with the wire distal, the balloon was retrieved and removed. A control arteriogram performed through the 5 [place] guide catheter in the left internal carotid artery demonstrated complete angiographic revascularization of the left middle cerebral artery distribution achieving a TICI 3 revascularization. Also noted now was patency of the left posterior cerebral artery distribution via the posterior communicating artery. Control arteriograms were then performed at 15 and 30 minutes post deployment of the stent which  continued to demonstrate excellent flow through the stented segment also with patency of the superior and inferior division branches into the more distal M4 and M5 regions of the left middle cerebral artery distribution. Final control arteriogram performed through the balloon guide in the left internal carotid artery after removal of the 5 [place] Catalyst guide catheter, and also the exchange micro guidewire demonstrated continued excellent apposition and patency of the angioplastied segment of the left middle cerebral artery with no intra stent abnormalities. A distal perfusion was now noted into the M4 M5 regions. Moderate spasm at the middle cervical left ICA responded to 25 mcg of nitroglycerin intra-arterially. The left anterior cerebral artery remained widely patent with cross filling of the right anterior cerebral A2 segment as described earlier. Balloon guide was removed. The Pinnacle sheath was removed with successful hemostasis with an 8 French Angio-Seal closure device. Distal pulses remained Dopplerable in both feet unchanged. Patient was left intubated to protect her airway as per anesthesia. An immediate CT scan of the brain performed on the table demonstrated contrast stain in the left basal ganglia region, and also the left parietal subcortical area. Patient was loaded with aspirin 81 mg, and Brilinta 180 mg p.o. via an orogastric tube just prior to the balloon angioplasty. A loading dose of cangrelor IV was given right after placement of the stent, with a 4 hr low-dose infusion with a CT of the brain to  follow. The patient's pupils were approximately 2 mm bilaterally though sluggish. The patient was then transferred to the PACU and then neuro ICU for post revascularization treatment. IMPRESSION: Status post endovascular complete revascularization of occluded left middle cerebral artery M1 segment with 1 pass with the Tiger 17 retrieval device and proximal aspiration followed by placement of a 2.25  mm x 12 mm Synergy balloon mounted drug-eluting stent with achievement of a TICI 3 revascularization. PLAN: Follow-up in the clinic approximately 4 weeks post discharge. Electronically Signed   By: Luanne Bras M.D.   On: 07/01/2020 12:48   CT Code Stroke Cerebral Perfusion with contrast  Result Date: 06/30/2020 CLINICAL DATA:  65 year old female code stroke presentation with left MCA ASPECTS 6. Left gaze deviation. EXAM: CT ANGIOGRAPHY HEAD AND NECK CT PERFUSION BRAIN TECHNIQUE: Multidetector CT imaging of the head and neck was performed using the standard protocol during bolus administration of intravenous contrast. Multiplanar CT image reconstructions and MIPs were obtained to evaluate the vascular anatomy. Carotid stenosis measurements (when applicable) are obtained utilizing NASCET criteria, using the distal internal carotid diameter as the denominator. Multiphase CT imaging of the brain was performed following IV bolus contrast injection. Subsequent parametric perfusion maps were calculated using RAPID software. CONTRAST:  132mL OMNIPAQUE IOHEXOL 350 MG/ML SOLN COMPARISON:  Plain head CT today 0721 hours. [facility] brain MRI 02/15/2016 FINDINGS: CT Brain Perfusion Findings: ASPECTS: 6 CBF (<30%) Volume: 65mL (erroneous). Using CBF less than 38% 11 mL of parenchyma is detected which does partially correspond to the area of cytotoxic edema by plain CT. Perfusion (Tmax>6.0s) volume: 58mL, hypoperfusion index 0.1 but also might be spurrious. Mismatch Volume: Calculated is not accurate due to erroneously low infarct core, estimated penumbra given the above is 60 to 67 mL. Infarction Location:Left MCA The above was discussed by telephone with Dr. Lesleigh Noe on 06/30/2020 at 07:49 . CTA NECK Skeleton: No acute osseous abnormality identified. Dystrophic/degenerative calcifications of the longus coli muscle insertion at C1-C2. Upper chest: Negative. Other neck: No  acute finding. Incidental contrast reflux into a left posterior neck vein. Aortic arch: Slightly bovine arch configuration. No arch atherosclerosis. Right carotid system: Mildly tortuous proximal right CCA. Moderate calcified plaque at the right ICA origin with less than 50 % stenosis with respect to the distal vessel. Mild tortuosity. Left carotid system: Patent, mildly tortuous left CCA. Mild to moderate calcified plaque at the posterior left ICA origin with less than 50 % stenosis with respect to the distal vessel. Mildly tortuous. Vertebral arteries: Negative proximal right subclavian artery and right vertebral artery origin. The right vertebral is mildly tortuous but patent to the skull base without stenosis. Moderate soft more than calcified plaque in the proximal left subclavian artery although no significant stenosis. Left vertebral artery origin remains normal. Codominant left vertebral artery is patent to the skull base without stenosis. CTA HEAD Posterior circulation: Mild right V4 calcified plaque. Patent distal vertebral arteries to the basilar with mild stenosis at the vertebrobasilar junction. Diminutive PICA. Left AICA appears dominant. Patent basilar artery is diminutive but without stenosis. Patent SCA origins with fetal type bilateral PCA origins. Patent basilar tip. Bilateral PCA branches are within normal limits. Anterior circulation: Both ICA siphons are patent. No significant siphon plaque or stenosis. Normal posterior communicating artery origins. Patent carotid termini although the left MCA origin is occluded (series 10, image 19). There is some left MCA branch reconstitution as seen on series 13, image 43. Left ACA  origin is normal. There is severe stenosis at the right ACA origin. Anterior communicating artery is diminutive. Bilateral A2 branches appear symmetric and within normal limits. Right MCA origin is patent but also irregular with mild stenosis. Right MCA M1 and right MCA  bifurcation are patent, although with moderate stenosis at the posterior right MCA M2 origin (series 12, image 10). No right MCA branch occlusion identified. Venous sinuses: Early contrast timing, grossly patent. Anatomic variants: Fetal type PCA origins. Review of the MIP images confirms the above findings IMPRESSION: 1. Positive for emergent large vessel occlusion: Left MCA origin. Some left MCA branch reconstitution. 2. CT Perfusion underestimates infarct core (ASPECTS 6), which is estimated at 11-22 mL, subsequent estimated penumbra of 60 to 67 mL. 3. No other large vessel occlusion. But intracranial atherosclerosis with: - Severe stenosis Right ACA origin. - moderate stenosis Right MCA M2 branch origin. - mild stenosis Right MCA M1, also Vertebrobasilar junction. 4. Cervical carotid atherosclerosis without stenosis. #1 discussed by telephone with Dr. Lesleigh Noe on 06/30/2020 at 0732 hours. And [facility] CTP findings discussed at 0749 hours. Electronically Signed   By: Genevie Ann M.D.   On: 06/30/2020 07:58   DG CHEST PORT 1 VIEW  Result Date: 07/02/2020 CLINICAL DATA:  Fever, shortness of breath, hypertension. Status post LEFT MCA thrombectomy. EXAM: PORTABLE CHEST 1 VIEW COMPARISON:  Chest x-ray dated 06/30/2020. FINDINGS: Endotracheal tube has been removed. Enteric tube passes below the diaphragm. Heart size and mediastinal contours appear stable. Lungs are clear. No pleural effusion or pneumothorax is seen. IMPRESSION: No active disease. No evidence of pneumonia or pulmonary edema. Electronically Signed   By: Franki Cabot M.D.   On: 07/02/2020 11:01   Portable Chest x-ray  Result Date: 06/30/2020 CLINICAL DATA:  Evaluate endotracheal tube placement EXAM: PORTABLE CHEST 1 VIEW COMPARISON:  09/17/2019 FINDINGS: The tip of the ET tube appears oriented towards the left mainstem bronchus. Consider withdrawing by 2 cm. The enteric tube tip is in the proximal stomach. The side port for the enteric tube is  just above the GE junction. Heart size normal. No pleural effusion, interstitial edema or airspace consolidation. IMPRESSION: 1. ET tube tip appears oriented towards the left mainstem bronchus. Consider withdrawing by 2 cm. 2. Consider advancement of enteric tube. 3. Lungs appear clear. 4. These results will be called to the ordering clinician or representative by the Radiologist Assistant, and communication documented in the PACS or [employer]. Electronically Signed   By: Kerby Moors M.D.   On: 06/30/2020 13:59   DG Abd Portable 1V  Result Date: 07/04/2020 CLINICAL DATA:  Enteric tube placement, history of gastric bypass EXAM: PORTABLE ABDOMEN - 1 VIEW COMPARISON:  07/01/2020 abdominal radiograph FINDINGS: Weighted enteric tube terminates in mid to lower left abdomen within a proximal small bowel loop. Cholecystectomy clips are seen in the right upper quadrant of the abdomen. Surgical sutures are noted in the medial upper abdomen bilaterally. No dilated small bowel loops. No evidence of pneumatosis or pneumoperitoneum. No radiopaque nephrolithiasis. IMPRESSION: Weighted enteric tube terminates in the mid to lower left abdomen within a proximal small bowel loop in this patient with a history of gastric bypass surgery. Electronically Signed   By: Ilona Sorrel M.D.   On: 07/04/2020 12:01   DG Abd Portable 1V  Result Date: 07/01/2020 CLINICAL DATA:  Feeding tube placement EXAM: PORTABLE ABDOMEN - 1 VIEW COMPARISON:  CT abdomen 07/14/2019 FINDINGS: A feeding tube is noted extending inferiorly into the left to terminate  just above the left iliac crest, presumably in a somewhat dilated stomach given the low location. Otherwise unremarkable bowel gas pattern. Clips in the right upper quadrant likely from prior cholecystectomy. IMPRESSION: 1. The feeding tube extends inferiorly into the left abdomen, presumably in a somewhat dilated stomach given the low location. Otherwise unremarkable bowel gas pattern.  2. Status post cholecystectomy. Electronically Signed   By: Van Clines M.D.   On: 07/01/2020 16:08   DG Swallowing Func-Speech Pathology  Result Date: 07/04/2020 Objective Swallowing Evaluation: Type of Study: MBS-Modified Barium Swallow Study  Patient Details Name: Klynn Linnemann MRN: [id number] Date of Birth: September 12, 1955 Today's Date: 07/04/2020 Time: SLP Start Time (ACUTE ONLY): 1419 -SLP Stop Time (ACUTE ONLY): 1434 SLP Time Calculation (min) (ACUTE ONLY): 15 min Past Medical History: Past Medical History: Diagnosis Date . Asthma  . Borderline diabetes  . Hypertension  Past Surgical History: Past Surgical History: Procedure Laterality Date . ANKLE SURGERY   . CARPAL TUNNEL RELEASE   . carpel tunnel   . IR CT HEAD LTD  06/30/2020 . IR INTRA CRAN STENT  06/30/2020 . IR PERCUTANEOUS ART THROMBECTOMY/INFUSION INTRACRANIAL INC DIAG ANGIO  06/30/2020 . RADIOLOGY WITH ANESTHESIA N/A 06/30/2020  Procedure: RADIOLOGY WITH ANESTHESIA;  Surgeon: Radiologist, Medication, MD;  Location: [facility];  Service: Radiology;  Laterality: N/A; HPI: Jalyiah Shelley is a 65 y.o. 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 CT probable few small areas of acute left MCA infarction, less apparent. Ill-defined patchy hyperdensity may reflect contrast. Found to have M1 occlusion and underwent thrombectomy and intubated 1/27-1/28 am. MRI pending.  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 She cleared oral cavity with mechanical soft textures. Consistently lingual residue fell to pyriform sinuses after swallow with spontaneous subswallow to clear. Her intact strength and ROM allowed her to  achieve complete laryngeal protection. However, mistimed initiation of protection in addition to larger sip with straw, led to trace aspiration stopping just below her vocal cords. A weak reflexive throat clear audible after 10 second delay. Pt's comprehension and execution of postures may not be consistent and able to rely on for safe consumption. Recommend initiate Dys 2 (fine chopped), thin liquids, NO straws, full supervision/assist, check for oral pocketing and pills whole in puree. Esophageal scan was unremarkable. SLP Visit Diagnosis Dysphagia, oropharyngeal phase (R13.12) Attention and concentration deficit following -- Frontal lobe and executive function deficit following -- Impact on safety and function Mild aspiration risk;Moderate aspiration risk   CHL IP TREATMENT RECOMMENDATION 07/04/2020 Treatment Recommendations Therapy as outlined in treatment plan below   Prognosis 07/04/2020 Prognosis for Safe Diet Advancement Good Barriers to Reach Goals -- Barriers/Prognosis Comment -- CHL IP DIET RECOMMENDATION 07/04/2020 SLP Diet Recommendations Dysphagia 2 (Fine chop) solids;Thin liquid Liquid Administration via No straw;Cup Medication Administration Whole meds with puree Compensations Slow rate;Small sips/bites;Lingual sweep for clearance of pocketing;Other (Comment) Postural Changes Seated upright at 90 degrees   CHL IP OTHER RECOMMENDATIONS 07/04/2020 Recommended Consults -- Oral Care Recommendations Oral care BID Other Recommendations --   CHL IP FOLLOW UP RECOMMENDATIONS 07/04/2020 Follow up Recommendations Inpatient Rehab   CHL IP FREQUENCY AND DURATION 07/04/2020 Speech Therapy Frequency (ACUTE ONLY) min 2x/week  Treatment Duration 2 weeks      CHL IP ORAL PHASE 07/04/2020 Oral Phase Impaired Oral - Pudding Teaspoon -- Oral - Pudding Cup -- Oral - Honey Teaspoon -- Oral - Honey Cup -- Oral - Nectar Teaspoon -- Oral - Nectar Cup Delayed oral transit;Piecemeal swallowing Oral - Nectar Straw Delayed oral  transit;Piecemeal swallowing Oral - Thin Teaspoon -- Oral - Thin Cup Right anterior bolus loss Oral - Thin Straw Delayed oral transit Oral - Puree Delayed oral transit;Other (Comment) Oral - Mech Soft Delayed oral transit Oral - Regular -- Oral - Multi-Consistency -- Oral - Pill -- Oral Phase - Comment --  CHL IP PHARYNGEAL PHASE 07/04/2020 Pharyngeal Phase Impaired Pharyngeal- Pudding Teaspoon -- Pharyngeal -- Pharyngeal- Pudding Cup -- Pharyngeal -- Pharyngeal- Honey Teaspoon -- Pharyngeal -- Pharyngeal- Honey Cup -- Pharyngeal -- Pharyngeal- Nectar Teaspoon -- Pharyngeal -- Pharyngeal- Nectar Cup WFL Pharyngeal -- Pharyngeal- Nectar Straw WFL Pharyngeal Other (Comment) Pharyngeal- Thin Teaspoon -- Pharyngeal -- Pharyngeal- Thin Cup WFL Pharyngeal -- Pharyngeal- Thin Straw Penetration/Aspiration during swallow Pharyngeal Other (Comment) Pharyngeal- Puree WFL Pharyngeal -- Pharyngeal- Mechanical Soft WFL Pharyngeal -- Pharyngeal- Regular -- Pharyngeal -- Pharyngeal- Multi-consistency -- Pharyngeal -- Pharyngeal- Pill -- Pharyngeal -- Pharyngeal Comment --  CHL IP CERVICAL ESOPHAGEAL PHASE 07/04/2020 Cervical Esophageal Phase WFL Pudding Teaspoon -- Pudding Cup -- Honey Teaspoon -- Honey Cup -- Nectar Teaspoon -- Nectar Cup -- Nectar Straw -- Thin Teaspoon -- Thin Cup -- Thin Straw -- Puree -- Mechanical Soft -- Regular -- Multi-consistency -- Pill -- Cervical Esophageal Comment -- [place] Siren 07/04/2020, 3:24 PM Orbie Pyo Litaker M.Ed Actor Pager [phone] Office [phone]              EEG adult  Result Date: 07/01/2020 Lora Havens, MD     07/01/2020 10:52 AM Patient Name: Courtney Burke MRN: [id number] Epilepsy Attending: Lora Havens Referring Physician/Provider: Dr. Rosalin Hawking Date: 07/01/2020 Duration: 24.04 mins Patient history: 65 year old female with recent left MCA stroke status post thrombectomy. EEG to evaluate for seizures. Level of alertness: Awake AEDs  during EEG study: None Technical aspects: This EEG study was done with scalp electrodes positioned according to the 10-20 International system of electrode placement. Electrical activity was acquired at a sampling rate of _0  and reviewed with a high frequency filter of _1  and a low frequency filter of _2 . EEG data were recorded continuously and digitally stored. Description: The posterior dominant rhythm consists of 8 Hz activity of moderate voltage (25-35 uV) seen predominantly in posterior head regions, symmetric and reactive to eye opening and eye closing.  EEG showed intermittent generalized and maximal left frontal region 3 to 5 Hz theta-delta slowing.  The 3 to 5 Hz theta and delta slowing in the left frontal region appeared sharply contoured and waxes and wanes without definite evolution.  Hyperventilation and photic stimulation were not performed.   ABNORMALITY -Frontal intermittent rhythmic delta activity, left frontal region -Intermittent slow, generalized and maximal left frontal region IMPRESSION: This study is suggestive of cortical dysfunction in left frontal region likely secondary underlying stroke as well as mild diffuse encephalopathy, nonspecific etiology.  Intermittent rhythmic delta activity in left frontal region is on the ictal-interictal continuum with low to intermediate potential for seizures.  No seizures or epileptiform discharges were seen throughout the recording. Lora Havens   ECHOCARDIOGRAM COMPLETE  Result Date: 07/01/2020    ECHOCARDIOGRAM REPORT   Patient Name:   [facility] [place] Date of Exam: 07/01/2020 Medical  Rec #:  [id number]     Height:       62.0 in Accession #:    [id number]    Weight:       207.0 lb Date of Birth:  04-Mar-1956     BSA:          1.940 m Patient Age:    1 years      BP:           119/64 mmHg Patient Gender: F             HR:           81 bpm. Exam Location:  Inpatient Procedure: 2D Echo Indications:    Stroke I163.9  History:        Patient has no  prior history of Echocardiogram examinations.                 Risk Factors:Hypertension.  Sonographer:    Mikki Santee RDCS (AE) Referring Phys: [place]  1. Left ventricular ejection fraction, by estimation, is 60 to 65%. The left ventricle has normal function. The left ventricle has no regional wall motion abnormalities. There is moderate left ventricular hypertrophy. Left ventricular diastolic parameters are consistent with Grade I diastolic dysfunction (impaired relaxation).  2. Right ventricular systolic function is normal. The right ventricular size is normal. There is normal pulmonary artery systolic pressure. The estimated right ventricular systolic pressure is 38.9 mmHg.  3. The mitral valve is grossly normal. Trivial mitral valve regurgitation.  4. The aortic valve is tricuspid. Aortic valve regurgitation is not visualized.  5. The inferior vena cava is normal in size with greater than 50% respiratory variability, suggesting right atrial pressure of 3 mmHg. Comparison(s): No prior Echocardiogram. FINDINGS  Left Ventricle: Left ventricular ejection fraction, by estimation, is 60 to 65%. The left ventricle has normal function. The left ventricle has no regional wall motion abnormalities. The left ventricular internal cavity size was normal in size. There is  moderate left ventricular hypertrophy. Left ventricular diastolic parameters are consistent with Grade I diastolic dysfunction (impaired relaxation). Indeterminate filling pressures. Right Ventricle: The right ventricular size is normal. No increase in right ventricular wall thickness. Right ventricular systolic function is normal. There is normal pulmonary artery systolic pressure. The tricuspid regurgitant velocity is 2.71 m/s, and  with an assumed right atrial pressure of 3 mmHg, the estimated right ventricular systolic pressure is 37.3 mmHg. Left Atrium: Left atrial size was normal in size. Right Atrium: Right atrial  size was normal in size. Pericardium: There is no evidence of pericardial effusion. Mitral Valve: The mitral valve is grossly normal. Trivial mitral valve regurgitation. Tricuspid Valve: The tricuspid valve is grossly normal. Tricuspid valve regurgitation is trivial. Aortic Valve: The aortic valve is tricuspid. Aortic valve regurgitation is not visualized. Pulmonic Valve: The pulmonic valve was normal in structure. Pulmonic valve regurgitation is not visualized. Aorta: The aortic root and ascending aorta are structurally normal, with no evidence of dilitation. Venous: The inferior vena cava is normal in size with greater than 50% respiratory variability, suggesting right atrial pressure of 3 mmHg. IAS/Shunts: No atrial level shunt detected by color flow Doppler.  LEFT VENTRICLE PLAX 2D LVIDd:         3.60 cm  Diastology LVIDs:         2.40 cm  LV e' medial:    6.53 cm/s LV PW:         1.30 cm  LV E/e' medial:  9.4 LV IVS:        1.30 cm  LV e' lateral:   6.85 cm/s LVOT diam:     2.10 cm  LV E/e' lateral: 8.9 LV SV:         86 LV SV Index:   44 LVOT Area:     3.46 cm  RIGHT VENTRICLE RV S prime:     14.40 cm/s TAPSE (M-mode): 2.3 cm LEFT ATRIUM             Index       RIGHT ATRIUM           Index LA diam:        3.40 cm 1.75 cm/m  RA Area:     14.80 cm LA Vol (A2C):   36.6 ml 18.87 ml/m RA Volume:   35.40 ml  18.25 ml/m LA Vol (A4C):   48.9 ml 25.21 ml/m LA Biplane Vol: 44.7 ml 23.04 ml/m  AORTIC VALVE LVOT Vmax:   107.00 cm/s LVOT Vmean:  69.700 cm/s LVOT VTI:    0.248 m  AORTA Ao Root diam: 2.80 cm Ao Asc diam:  3.20 cm MITRAL VALVE               TRICUSPID VALVE MV Area (PHT): 2.87 cm    TR Peak grad:   29.4 mmHg MV Decel Time: 264 msec    TR Vmax:        271.00 cm/s MV E velocity: 61.10 cm/s MV A velocity: 99.90 cm/s  SHUNTS MV E/A ratio:  0.61        Systemic VTI:  0.25 m                            Systemic Diam: 2.10 cm Lyman Bishop MD Electronically signed by Lyman Bishop MD Signature Date/Time:  07/01/2020/1:26:54 PM    Final    IR PERCUTANEOUS ART THROMBECTOMY/INFUSION INTRACRANIAL INC DIAG ANGIO  Result Date: 07/04/2020 INDICATION: Aphasia, left gaze deviation with right-sided weakness. 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 EXAM: 1. EMERGENT LARGE VESSEL OCCLUSION THROMBOLYSIS anterior CIRCULATION) COMPARISON:  CT angiogram of the head and neck of June 30, 2020. MEDICATIONS: Ancef 2 g IV antibiotic was administered within 1 hour of the procedure. ANESTHESIA/SEDATION: General anesthesia CONTRAST:  Isovue 300 approximately 120 mL FLUOROSCOPY TIME:  Fluoroscopy Time: 15 minutes 18 seconds (2667 mGy). COMPLICATIONS: None immediate. TECHNIQUE: Following a full explanation of the procedure along with the potential associated complications, an informed witnessed consent was obtained from the patient's daughter. The risks of intracranial hemorrhage of 10%, worsening neurological deficit, ventilator dependency, death and inability to revascularize were all reviewed in detail with the patient's daughter. The patient was then put under general anesthesia by the Department of Anesthesiology at [facility]. The right groin was prepped and draped in the usual sterile fashion. Thereafter using modified Seldinger technique, transfemoral access into the right common femoral artery was obtained without difficulty. Over a 0.035 inch guidewire an 8 [place] from 25 cm Pinnacle sheath was inserted. Through this, and also over a 0.035 inch guidewire a combination of Simmons 2 5.5 [place] support catheter inside of 087 balloon guide catheter combination was advanced to the aortic arch, and cannulation was performed of the left common carotid artery. Over a 0.035 inch glide guidewire, the support catheter, and the 087 balloon guidewire were advanced to the left common carotid artery  bifurcation. The guidewire and support Simmons 2 were removed. Good aspiration obtained  from the hub of the balloon guide catheter. Control arteriogram performed through the balloon guide in the left common carotid artery was performed. FINDINGS: The left common carotid arteriogram demonstrates the left external carotid artery and its major branches to be widely patent. The left internal carotid artery at the bulb to the cranial skull base demonstrates wide patency with moderate tortuosity in its mid cervical segment. The petrous, cavernous and supraclinoid segments demonstrate wide patency. 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 The left anterior cerebral artery demonstrates approximately 50% stenosis at its A1 segment. However, flow is noted distally into the left anterior cerebral artery distribution. Flash filling of the left posterior communicating artery is also demonstrated. PROCEDURE: Through the balloon guide catheter in the proximal left internal carotid artery, a combination of an 014 inch standard Synchro micro guidewire with an 021 Headway microcatheter and an 071 136 cm Zoom aspiration catheter was advanced as the combination to the supraclinoid left ICA. The balloon guide was advanced further distally into the distal cervical left ICA. Using a torque device, access was obtained into the occluded left middle cerebral artery into the inferior division branch M2 M3 region followed by the microcatheter. The guidewire was removed. Good aspiration obtained from the tip of the microcatheter. A gentle control arteriogram performed through microcatheter demonstrated safe position of the tip of the microcatheter which was now connected to continuous heparinized saline infusion. An [place] retrieval device was then advanced to the distal end of the microcatheter. The O ring on the delivery microcatheter was loosened. The Tiger retrieval device was retrieved and deployed such that the proximal marker was just inside the proximal portion of the occluded  left middle cerebral artery. The 071 aspiration catheter was advanced to just proximal to the origin of the right middle cerebral artery. Thereafter the retrieval device was expanded and decreased in size multiple times. 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 The retrieval device was again expanded right at the proximal aspect of the occlusion. Microcatheter was locked into position. Thereafter as constant aspiration was applied at the hub of the aspiration catheter at the origin of the left middle cerebral artery, and with the a 20 mL syringe at the hub of the balloon guide catheter in the left internal carotid artery with proximal occlusion for about 2 minutes, the combination of the retrieval device, the microcatheter and the Zoom aspiration device were retrieved and removed. Following reversal of flow arrest, control arteriogram performed through the balloon guide in the left internal carotid artery now demonstrated complete revascularization of the left middle cerebral artery distribution with a TICI 3 revascularization. However, this also unmasked a severe high-grade stenosis at the left MCA origin. At this point through the balloon guide in the left internal carotid artery, a combination of a 5 [place] Catalyst guide catheter inside of which was the 021 microcatheter was advanced over a 0.014 inch standard Synchro micro guidewire to the supraclinoid left ICA. At this time there was complete occlusion of the left middle cerebral artery due to recoil phenomenon due to the atherosclerotic plaque. Micro guidewire was then gently advanced through the occluded left middle cerebral artery without difficulty in the superior division followed by the microcatheter. The guidewire was removed. Good aspiration obtained  from the hub of the  microcatheter. A gentle control arteriogram performed through this demonstrated safe position of the tip of the microcatheter. This in turn was then replaced with an 014 inch supported 300 cm Synchro micro guidewire with a J-tip configuration under constant fluoroscopic guidance. The tip of the exchange micro guidewire was maintained as the microcatheter was removed. Measurements were then performed of the left middle cerebral artery in its most normal segment just distal to the occlusion. It was elected to proceed with placement of a 2.25 mm x 12 mm Synergy drug-eluting stent. This was a retrogradely prepped with heparinized saline infusion, and also with 50% contrast and 50% heparinized saline infusion. Using the rapid exchange technique, the stent delivery system was advanced without difficulty to the supraclinoid left ICA. The delivery of the stent was then advanced without difficulty through the occluded left middle cerebral artery. Its proximal marker was positioned just proximal to the occluded left middle cerebral artery proximally. Thereafter, a control inflation was then performed using micro inflation syringe device via micro tubing with the balloon inflated to approximately 2.1 mm where it was maintained for approximately 20 seconds. Thereafter, with the wire distal, the balloon was retrieved and removed. A control arteriogram performed through the 5 [place] guide catheter in the left internal carotid artery demonstrated complete angiographic revascularization of the left middle cerebral artery distribution achieving a TICI 3 revascularization. Also noted now was patency of the left posterior cerebral artery distribution via the posterior communicating artery. Control arteriograms were then performed at 15 and 30 minutes post deployment of the stent which continued to demonstrate excellent flow through the stented segment also with patency of the superior and inferior division branches into the more  distal M4 and M5 regions of the left middle cerebral artery distribution. Final control arteriogram performed through the balloon guide in the left internal carotid artery after removal of the 5 [place] Catalyst guide catheter, and also the exchange micro guidewire demonstrated continued excellent apposition and patency of the angioplastied segment of the left middle cerebral artery with no intra stent abnormalities. A distal perfusion was now noted into the M4 M5 regions. Moderate spasm at the middle cervical left ICA responded to 25 mcg of nitroglycerin intra-arterially. The left anterior cerebral artery remained widely patent with cross filling of the right anterior cerebral A2 segment as described earlier. Balloon guide was removed. The Pinnacle sheath was removed with successful hemostasis with an 8 French Angio-Seal closure device. Distal pulses remained Dopplerable in both feet unchanged. Patient was left intubated to protect her airway as per anesthesia. An immediate CT scan of the brain performed on the table demonstrated contrast stain in the left basal ganglia region, and also the left parietal subcortical area. Patient was loaded with aspirin 81 mg, and Brilinta 180 mg p.o. via an orogastric tube just prior to the balloon angioplasty. A loading dose of cangrelor IV was given right after placement of the stent, with a 4 hr low-dose infusion with a CT of the brain to follow. The patient's pupils were approximately 2 mm bilaterally though sluggish. The patient was then transferred to the PACU and then neuro ICU for post revascularization treatment. IMPRESSION: Status post endovascular complete revascularization of occluded left middle cerebral artery M1 segment with 1 pass with the Tiger 17 retrieval device and proximal aspiration followed by placement of a 2.25 mm x 12 mm Synergy balloon mounted drug-eluting stent with achievement of a TICI 3 revascularization. PLAN: Follow-up in the clinic  approximately 4  weeks post discharge. Electronically Signed   By: Luanne Bras M.D.   On: 07/01/2020 12:48   CT HEAD CODE STROKE WO CONTRAST  Result Date: 06/30/2020 CLINICAL DATA:  Code stroke. 65 year old female with right side weakness, nonverbal. EXAM: CT HEAD WITHOUT CONTRAST TECHNIQUE: Contiguous axial images were obtained from the base of the skull through the vertex without intravenous contrast. COMPARISON:  [facility] brain MRI 02/15/2016 FINDINGS: Brain: No ventriculomegaly. No midline shift, mass effect, or evidence of intracranial mass lesion. No acute intracranial hemorrhage identified. Asymmetric left MCA territory indistinct gray and white matter hypodensity compatible with cytotoxic edema (series 2, image 15, 13). Left insula, left M2 and M3 segments affected. Left lentiform also asymmetric on series 2, image 16. Contralateral right hemisphere and posterior fossa gray-white matter differentiation is preserved. There is a small chronic appearing right cerebellar infarct which is new from 2017 (series 2, image 10). No acute intracranial hemorrhage identified. Vascular: Mild Calcified atherosclerosis at the skull base. No suspicious intracranial vascular hyperdensity. Skull: Negative. Sinuses/Orbits: 6 Visualized paranasal sinuses and mastoids are clear. Other: Leftward gaze deviation. Visualized scalp soft tissues are within normal limits. ASPECTS [facility] Stroke Program Early CT Score) - Ganglionic level infarction (caudate, lentiform nuclei, internal capsule, insula, M1-M3 cortex): 3 - Supraganglionic infarction (M4-M6 cortex): 3 Total score (0-10 with 10 being normal): 6 IMPRESSION: 1. Acute Left MCA territory infarct. ASPECTS 6. No hemorrhage or mass effect at this time. 2. The above discussed by telephone with Dr. Curly Shores on 06/30/2020 at 07:32 . 3. 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 Electronically Signed   By: Genevie Ann M.D.   On:  06/30/2020 07:34   VAS [place] LOWER EXTREMITY VENOUS (DVT)  Result Date: 07/01/2020  Lower Venous DVT Study Other Indications: Embolic stroke. Risk Factors: None identified. Comparison Study: No previous exam Performing Technologist: Vonzell Schlatter RVT  Examination Guidelines: A complete evaluation includes B-mode imaging, spectral Doppler, color Doppler, and power Doppler as needed of all accessible portions of each vessel. Bilateral testing is considered an integral part of a complete examination. Limited examinations for reoccurring indications may be performed as noted. The reflux portion of the exam is performed with the patient in reverse Trendelenburg.  +---------+---------------+---------+-----------+----------+--------------+ RIGHT    CompressibilityPhasicitySpontaneityPropertiesThrombus Aging +---------+---------------+---------+-----------+----------+--------------+ CFV      Full           Yes      Yes                                 +---------+---------------+---------+-----------+----------+--------------+ SFJ      Full                                                        +---------+---------------+---------+-----------+----------+--------------+ FV Prox  Full                                                        +---------+---------------+---------+-----------+----------+--------------+ FV Mid   Full                                                        +---------+---------------+---------+-----------+----------+--------------+  FV DistalFull                                                        +---------+---------------+---------+-----------+----------+--------------+ PFV      Full                                                        +---------+---------------+---------+-----------+----------+--------------+ POP      Full           Yes      Yes                                  +---------+---------------+---------+-----------+----------+--------------+ PTV      Full                                                        +---------+---------------+---------+-----------+----------+--------------+ PERO     Full                                                        +---------+---------------+---------+-----------+----------+--------------+   +---------+---------------+---------+-----------+----------+--------------+ LEFT     CompressibilityPhasicitySpontaneityPropertiesThrombus Aging +---------+---------------+---------+-----------+----------+--------------+ CFV      Full           Yes      Yes                                 +---------+---------------+---------+-----------+----------+--------------+ SFJ      Full                                                        +---------+---------------+---------+-----------+----------+--------------+ FV Prox  Full                                                        +---------+---------------+---------+-----------+----------+--------------+ FV Mid   Full                                                        +---------+---------------+---------+-----------+----------+--------------+ FV DistalFull                                                        +---------+---------------+---------+-----------+----------+--------------+  PFV      Full                                                        +---------+---------------+---------+-----------+----------+--------------+ POP      Full           Yes      Yes                                 +---------+---------------+---------+-----------+----------+--------------+ PTV      Full                                                        +---------+---------------+---------+-----------+----------+--------------+ PERO     Full                                                         +---------+---------------+---------+-----------+----------+--------------+     Summary: BILATERAL: - No evidence of deep vein thrombosis seen in the lower extremities, bilaterally. -No evidence of popliteal cyst, bilaterally. RIGHT: - Ultrasound characteristics of a ruptured Baker's Cyst are noted.   *See table(s) above for measurements and observations. Electronically signed by Jamelle Haring on 07/01/2020 at 5:16:40 PM.    Final        The results of significant diagnostics from this hospitalization (including imaging, microbiology, ancillary and laboratory) are listed below for reference.     Microbiology: Recent Results (from the past 240 hour(s))  SARS Coronavirus 2 by RT PCR (hospital order, performed in [facility] hospital lab) Nasopharyngeal Nasopharyngeal Swab     Status: None   Collection Time: 06/30/20  7:36 AM   Specimen: Nasopharyngeal Swab  Result Value Ref Range Status   SARS Coronavirus 2 NEGATIVE NEGATIVE Final    Comment: (NOTE) SARS-CoV-2 target nucleic acids are NOT DETECTED.  The SARS-CoV-2 RNA is generally detectable in upper and lower respiratory specimens during the acute phase of infection. The lowest concentration of SARS-CoV-2 viral copies this assay can detect is 250 copies / mL. A negative result does not preclude SARS-CoV-2 infection and should not be used as the sole basis for treatment or other patient management decisions.  A negative result may occur with improper specimen collection / handling, submission of specimen other than nasopharyngeal swab, presence of viral mutation(s) within the areas targeted by this assay, and inadequate number of viral copies (<250 copies / mL). A negative result must be combined with clinical observations, patient history, and epidemiological information.  Fact Sheet for Patients:   StrictlyIdeas.no  Fact Sheet for Healthcare Providers: BankingDealers.co.za  This test  is not yet approved or  cleared by the [place] FDA and has been authorized for detection and/or diagnosis of SARS-CoV-2 by FDA under an Emergency Use Authorization (EUA).  This EUA will remain in effect (meaning this test can be used) for the duration of the COVID-19 declaration under Section 564(b)(1) of the Act, 21 U.S.C. section 360bbb-3(b)(1), unless the  authorization is terminated or revoked sooner.  Performed at [facility], [place] [address] [place], [place] [address]   MRSA PCR Screening     Status: None   Collection Time: 06/30/20  1:35 PM   Specimen: Nasal Mucosa; Nasopharyngeal  Result Value Ref Range Status   MRSA by PCR NEGATIVE NEGATIVE Final    Comment:        The GeneXpert MRSA Assay (FDA approved for NASAL specimens only), is one component of a comprehensive MRSA colonization surveillance program. It is not intended to diagnose MRSA infection nor to guide or monitor treatment for MRSA infections. Performed at [facility], [place] [address], [place], [place] [address]      Labs:  CBC: Recent Labs  Lab 07/02/20 1341 07/03/20 0534 07/04/20 0707 07/05/20 0252 07/06/20 0445 07/07/20 0348  WBC 10.3 9.3 7.0 7.3 7.6  --   HGB 8.2* 8.5* 8.2* 9.2* 8.9* 10.3*  HCT 25.2* 25.2* 25.0* 28.4* 27.3* 31.5*  MCV 86.3 86.9 86.2 86.1 86.1  --   PLT 369 385 402* 487* 523*  --    BMP &GFR Recent Labs  Lab 07/03/20 0534 07/04/20 0707 07/05/20 0252 07/06/20 0445 07/07/20 0348  NA 146* 145 143 139 136  K 3.8 3.8 3.6 3.3* 4.3  CL 115* 114* 109 107 103  CO2 20* _0 19*  GLUCOSE 107* 92 91 137* 88  BUN _1 CREATININE 1.26* 1.16* 1.20* 1.21* 1.25*  CALCIUM 8.4* 8.8* 8.9 8.7* 9.3  MG  --   --   --   --  2.1  PHOS  --   --   --   --  3.6   Estimated Creatinine Clearance: 47.2 mL/min (A) (by C-G formula based on SCr of 1.25 mg/dL (H)). Liver & Pancreas: Recent Labs  Lab 07/07/20 0348  ALBUMIN 3.0*   No results for input(s):  LIPASE, AMYLASE in the last 168 hours. No results for input(s): AMMONIA in the last 168 hours. Diabetic: No results for input(s): HGBA1C in the last 72 hours. Recent Labs  Lab 07/04/20 1518 07/04/20 1949 07/05/20 0808 07/05/20 1219 07/05/20 1704  GLUCAP 117* 85 93 111* 98   Cardiac Enzymes: No results for input(s): CKTOTAL, CKMB, CKMBINDEX, TROPONINI in the last 168 hours. No results for input(s): PROBNP in the last 8760 hours. Coagulation Profile: No results for input(s): INR, PROTIME in the last 168 hours. Thyroid Function Tests: No results for input(s): TSH, T4TOTAL, FREET4, T3FREE, THYROIDAB in the last 72 hours. Lipid Profile: No results for input(s): CHOL, HDL, LDLCALC, TRIG, CHOLHDL, LDLDIRECT in the last 72 hours. Anemia Panel: Recent Labs    07/06/20 0445  VITAMINB12 243  FOLATE 6.4  FERRITIN 59  TIBC 266  IRON 26*  RETICCTPCT 3.0   Urine analysis:    Component Value Date/Time   COLORURINE YELLOW 06/30/2020 0856   APPEARANCEUR CLEAR 06/30/2020 0856   LABSPEC 1.017 06/30/2020 0856   PHURINE 5.0 06/30/2020 0856   GLUCOSEU NEGATIVE 06/30/2020 0856   HGBUR MODERATE (A) 06/30/2020 0856   BILIRUBINUR NEGATIVE 06/30/2020 0856   KETONESUR NEGATIVE 06/30/2020 0856   PROTEINUR NEGATIVE 06/30/2020 0856   NITRITE NEGATIVE 06/30/2020 0856   LEUKOCYTESUR TRACE (A) 06/30/2020 0856   Sepsis Labs: Invalid input(s): PROCALCITONIN, LACTICIDVEN   Time coordinating discharge: 45 minutes  SIGNED:  Mercy Riding, MD  [facility] 07/08/2020, 10:37 AM  If 7PM-7AM, please contact night-coverage [email or web address]

## 2020-07-08 NOTE — Plan of Care (Signed)
  Problem: Education: Goal: Knowledge of disease or condition will improve Outcome: Adequate for Discharge Goal: Knowledge of secondary prevention will improve Outcome: Adequate for Discharge   Problem: Coping: Goal: Will verbalize positive feelings about self Outcome: Adequate for Discharge   Problem: Nutrition: Goal: Risk of aspiration will decrease Outcome: Adequate for Discharge   Problem: Ischemic Stroke/[name] Tissue Perfusion: Goal: Complications of ischemic stroke/[name] will be minimized Outcome: Adequate for Discharge   Problem: Education: Goal: Knowledge of General Education information will improve Description: Including pain rating scale, medication(s)/side effects and non-pharmacologic comfort measures Outcome: Adequate for Discharge   Problem: Health Behavior/Discharge Planning: Goal: Ability to manage health-related needs will improve Outcome: Adequate for Discharge   Problem: Clinical Measurements: Goal: Ability to maintain clinical measurements within normal limits will improve Outcome: Adequate for Discharge Goal: Will remain free from infection Outcome: Adequate for Discharge Goal: Diagnostic test results will improve Outcome: Adequate for Discharge Goal: Respiratory complications will improve Outcome: Adequate for Discharge Goal: Cardiovascular complication will be avoided Outcome: Adequate for Discharge   Problem: Activity: Goal: Risk for activity intolerance will decrease Outcome: Adequate for Discharge   Problem: Nutrition: Goal: Adequate nutrition will be maintained Outcome: Adequate for Discharge   Problem: Coping: Goal: Level of anxiety will decrease Outcome: Adequate for Discharge   Problem: Elimination: Goal: Will not experience complications related to bowel motility Outcome: Adequate for Discharge Goal: Will not experience complications related to urinary retention Outcome: Adequate for Discharge   Problem: Pain Managment: Goal:  General experience of comfort will improve Outcome: Adequate for Discharge   Problem: Safety: Goal: Ability to remain free from injury will improve Outcome: Adequate for Discharge   Problem: Skin Integrity: Goal: Risk for impaired skin integrity will decrease Outcome: Adequate for Discharge

## 2020-07-08 NOTE — Plan of Care (Signed)
  Problem: Education: Goal: Knowledge of disease or condition will improve Outcome: Progressing Goal: Knowledge of secondary prevention will improve Outcome: Progressing   Problem: Coping: Goal: Will verbalize positive feelings about self Outcome: Progressing   Problem: Nutrition: Goal: Risk of aspiration will decrease Outcome: Progressing   Problem: Ischemic Stroke/[name] Tissue Perfusion: Goal: Complications of ischemic stroke/[name] will be minimized Outcome: Progressing   Problem: Education: Goal: Knowledge of General Education information will improve Description: Including pain rating scale, medication(s)/side effects and non-pharmacologic comfort measures Outcome: Progressing   Problem: Health Behavior/Discharge Planning: Goal: Ability to manage health-related needs will improve Outcome: Progressing   Problem: Clinical Measurements: Goal: Ability to maintain clinical measurements within normal limits will improve Outcome: Progressing Goal: Will remain free from infection Outcome: Progressing Goal: Diagnostic test results will improve Outcome: Progressing Goal: Respiratory complications will improve Outcome: Progressing Goal: Cardiovascular complication will be avoided Outcome: Progressing   Problem: Activity: Goal: Risk for activity intolerance will decrease Outcome: Progressing   Problem: Nutrition: Goal: Adequate nutrition will be maintained Outcome: Progressing   Problem: Coping: Goal: Level of anxiety will decrease Outcome: Progressing   Problem: Elimination: Goal: Will not experience complications related to bowel motility Outcome: Progressing Goal: Will not experience complications related to urinary retention Outcome: Progressing   Problem: Pain Managment: Goal: General experience of comfort will improve Outcome: Progressing   Problem: Safety: Goal: Ability to remain free from injury will improve Outcome: Progressing   Problem: Skin  Integrity: Goal: Risk for impaired skin integrity will decrease Outcome: Progressing   

## 2020-07-08 NOTE — Progress Notes (Signed)
Inpatient Rehab Admissions Coordinator:  There is a bed available to admit pt to CIR today.  Dr. Cyndia Skeeters aware and in agreement.  NSG, TOC, pt and family aware.    Gayland Curry, [place], [place] Admissions Coordinator [phone]

## 2020-07-08 NOTE — Progress Notes (Signed)
Signed        PMR Admission Coordinator Pre-Admission Assessment   Patient: Courtney Burke is an 65 y.o., female MRN: [id number] DOB: 12/01/1955 Height: 5' 2"  (157.5 cm) Weight: 91.3 kg                                                                                                                                                  Insurance Information HMO:  PPO:      PCP:      IPA:      80/20:      OTHER:  PRIMARY: [employer] Medicare           Policy#: [id number]      Subscriber: Pt.  CM Name:  Montey Hora     Phone#: #: [phone] option #8     Fax#: [phone]  I received approval from Levada Dy at [facility] on 2/1 with approval for admission 2/2, with clinical updates due on 2/8 Pre-Cert#: [id number]     Employer:  Benefits:  Phone #: opened on online portal    Eff Date: 06/04/2020- still active Deductible: $200 ($0 met) OOP Max: $2,200 ($0 met) CIR: After the $200.00 deductible has been met then $0.00 Copayment per admit for Medicare-covered hospital care. SNF: 100% coverage Outpatient:  $0/visit, does not have a limitation on the number of visits allowed for therapy coverage Home Health: 100% coverage; DME: 100% coverage    SECONDARY:       Policy#:       Phone#:    Financial Counselor:      Phone#:    The "Data Collection Information Summary" for patients in Inpatient Rehabilitation Facilities with attached "Privacy Act [place] Records" was provided and verbally reviewed with: Patient   Emergency Contact Information         Contact Information     Name Relation Home Work [place], [place] Daughter     [phone]    Grant Ruts [phone]        Short,Chris Significant other [phone]           Current Medical History  Patient Admitting Diagnosis: CVA History of Present Illness: Courtney Burke is a 64 y.o. 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  Presented 06/30/2020 with altered mental status and aphasia.  Cranial CT scan showed acute left MCA territory infarction no hemorrhage or mass-effect.  Patient did not receive TPA.  EEG negative for seizure.  CT angiogram head and neck positive for emergent large vessel occlusion.  Left MCA origin.  Some left MCA branch reconstitution  and underwent MCA stenting per interventional radiology.  MRI follow-up showed acute infarct left MCA territory involving the basal ganglia as well as left temporal frontal and parietal lobes.  Small amount of  hemorrhage in the left posterior temporal lobe.  Additional small areas of acute infarct in the occipital white matter bilaterally and right cerebellum.  Echocardiogram with ejection fraction of 60 to 65% no wall motion abnormalities grade 1 diastolic dysfunction.  Currently maintained on aspirin 81 mg daily as well as Brilinta for CVA prophylaxis.  Subcutaneous Lovenox for DVT prophylaxis.  Patient is maintained on Keppra for seizure prophylaxis.  Patient is currently n.p.o. with alternative means of nutritional support as patient did pull out her NG tube there was consideration of possible need for PEG tube.  Bouts of urinary retention placed on Urecholine. Physical Medicine & Rehabilitation was consulted to assess candidacy for CIR given global aphasia and right sided weakness.    Complete NIHSS TOTAL: 10 Glasgow Coma Scale Score: 12   Past Medical History      Past Medical History:  Diagnosis Date  . Asthma    . Borderline diabetes    . Hypertension        Family History  family history is not on file.   Prior Rehab/Hospitalizations:  Has the patient had prior rehab or hospitalizations prior to admission? No   Has the patient had major surgery during 100 days prior to admission? Yes   Current Medications    Current Facility-Administered Medications:  .  acetaminophen (TYLENOL) tablet 650 mg, 650 mg, Oral, Q4H PRN **OR**  acetaminophen (TYLENOL) 160 MG/5ML solution 650 mg, 650 mg, Per Tube, Q4H PRN, 650 mg at 07/08/20 0950 **OR** acetaminophen (TYLENOL) suppository 650 mg, 650 mg, Rectal, Q4H PRN, Kirby-Graham, Karsten Fells, NP .  albuterol (PROVENTIL) (2.5 MG/3ML) 0.083% nebulizer solution 2.5 mg, 2.5 mg, Nebulization, Q6H PRN, Sherry Ruffing, Marissa M, MD .  aspirin chewable tablet 81 mg, 81 mg, Oral, Daily **OR** aspirin chewable tablet 81 mg, 81 mg, Per Tube, Daily, Deveshwar, Sanjeev, MD, 81 mg at 07/08/20 0926 .  atorvastatin (LIPITOR) tablet 80 mg, 80 mg, Per Tube, QHS, Bhagat, Srishti L, MD, 80 mg at 07/07/20 2245 .  bethanechol (URECHOLINE) tablet 10 mg, 10 mg, Per Tube, TID, Wendee Beavers T, MD, 10 mg at 07/08/20 0927 .  calcium citrate (CALCITRATE - dosed in mg elemental calcium) tablet 200 mg of elemental calcium, 200 mg of elemental calcium, Per Tube, TID, Skeet Simmer, RPH, 200 mg of elemental calcium at 07/08/20 0926 .  chlorhexidine gluconate (MEDLINE KIT) (PERIDEX) 0.12 % solution 15 mL, 15 mL, Mouth Rinse, BID, Bowser, Grace E, NP, 15 mL at 07/08/20 0954 .  Chlorhexidine Gluconate Cloth 2 % PADS 6 each, 6 each, Topical, Daily, Bhagat, Srishti L, MD, 6 each at 07/08/20 0929 .  cholecalciferol (VITAMIN D3) tablet 1,000 Units, 1,000 Units, Per Tube, QAC breakfast, Skeet Simmer, [facility], 1,000 Units at 07/08/20 4132 .  docusate (COLACE) 50 MG/5ML liquid 100 mg, 100 mg, Per Tube, BID, Bowser, Laurel Dimmer, NP, 100 mg at 07/08/20 0924 .  enoxaparin (LOVENOX) injection 40 mg, 40 mg, Subcutaneous, Q24H, Rosalin Hawking, MD, 40 mg at 07/07/20 2242 .  feeding supplement (ENSURE ENLIVE / ENSURE PLUS) liquid 237 mL, 237 mL, Oral, BID BM, Gonfa, Taye T, MD, 237 mL at 07/07/20 1035 .  feeding supplement (OSMOLITE 1.2 CAL) liquid 1,000 mL, 1,000 mL, Per Tube, Continuous, Rosalin Hawking, MD, Stopped at 07/03/20 0130 .  feeding supplement (PROSource TF) liquid 45 mL, 45 mL, Per Tube, BID, Rosalin Hawking, MD, 45 mL at 07/08/20 0925 .  free water  200 mL, 200 mL, Per Tube, Q4H, Freddi Starr,  MD, 200 mL at 07/08/20 0929 .  iohexol (OMNIPAQUE) 300 MG/ML solution 50 mL, 50 mL, Intra-arterial, Once PRN, Deveshwar, Sanjeev, MD .  labetalol (NORMODYNE) injection 10 mg, 10 mg, Intravenous, Q2H PRN, Asencion Noble, MD, 10 mg at 07/05/20 0540 .  levETIRAcetam (KEPPRA) 100 MG/ML solution 500 mg, 500 mg, Per Tube, BID, Rosalin Hawking, MD, 500 mg at 07/08/20 2878 .  multivitamin with minerals tablet 1 tablet, 1 tablet, Per Tube, BID, Loyce Dys D, RD, 1 tablet at 07/08/20 6767 .  ondansetron (ZOFRAN) injection 4 mg, 4 mg, Intravenous, Q6H PRN, Frederik Pear, MD, 4 mg at 07/01/20 0854 .  pantoprazole sodium (PROTONIX) 40 mg/20 mL oral suspension 40 mg, 40 mg, Per Tube, Daily, Jennye Boroughs, MD, 40 mg at 07/08/20 0925 .  polyethylene glycol (MIRALAX / GLYCOLAX) packet 17 g, 17 g, Per Tube, Daily, Bowser, Grace E, NP, 17 g at 07/08/20 0926 .  Resource ThickenUp Clear, , Oral, PRN, Cyndia Skeeters, Taye T, MD .  senna-docusate (Senokot-S) tablet 1 tablet, 1 tablet, Per Tube, QHS, Bhagat, Srishti L, MD, 1 tablet at 07/07/20 2245 .  sodium chloride flush (NS) 0.9 % injection 3 mL, 3 mL, Intravenous, Once, Truddie Hidden, MD .  ticagrelor [facility]) tablet 90 mg, 90 mg, Oral, BID **OR** ticagrelor (BRILINTA) tablet 90 mg, 90 mg, Per Tube, BID, Deveshwar, Sanjeev, MD, 90 mg at 07/08/20 2094   Patients Current Diet:     Diet Order                      Diet - low sodium heart healthy              DIET DYS 2 Room service appropriate? No; Fluid consistency: Nectar Thick  Diet effective now                      Precautions / Restrictions Precautions Precautions: Fall Restrictions Weight Bearing Restrictions: No    Has the patient had 2 or more falls or a fall with injury in the past year?No   Prior Activity Level Community (5-7x/wk): Pt. was active in the community PTA   Prior Functional Level Prior Function Comments: unable to  determine as no family present and pt is aphasic. Based on documentation it seems the pt was able to drive her brothers car so PT assumes the pt is fairly independent.   Self Care: Did the patient need help bathing, dressing, using the toilet or eating?  Independent   Indoor Mobility: Did the patient need assistance with walking from room to room (with or without device)? Independent   Stairs: Did the patient need assistance with internal or external stairs (with or without device)? Independent   Functional Cognition: Did the patient need help planning regular tasks such as shopping or remembering to take medications? Independent   Home Assistive Devices / Equipment   Prior Device Use: Indicate devices/aids used by the patient prior to current illness, exacerbation or injury? None of the above   Current Functional Level Cognition   Arousal/Alertness: Awake/alert Overall Cognitive Status: Difficult to assess Difficult to assess due to: Impaired communication Current Attention Level: Sustained Orientation Level: Other (comment) (UTA) Following Commands: Follows one step commands consistently,Follows multi-step commands inconsistently Safety/Judgement: Decreased awareness of safety General Comments: pt needing extra time for processing. Difficult to assess due to aphasia and pt very flat today Attention: Sustained Sustained Attention: Impaired Sustained Attention Impairment: Functional basic Memory:  (TBA) Awareness:  Impaired Awareness Impairment: Emergent impairment Problem Solving:  (will assess further) Safety/Judgment: Impaired    Extremity Assessment (includes Sensation/Coordination)   Upper Extremity Assessment: RUE deficits/detail RUE Deficits / Details: Pt with decreased AROM. Initating functional reach, unsure how purposeful. Shoulder hiking in attempted reach. Very weak. poor grasp. RUE Coordination: decreased fine motor,decreased gross motor  Lower Extremity Assessment:  Defer to PT evaluation RLE Deficits / Details: pt demonstrates at least 3/5 strength, difficult to formally assess 2/2 communication deficits LLE Deficits / Details: at least 4-/5 based on observed mobility, difficult to formally assess     ADLs   Overall ADL's : Needs assistance/impaired Eating/Feeding: NPO Eating/Feeding Details (indicate cue type and reason): pt noted to be choking on secretions sitting in bed. pt with tearful eyes nose slightly wet and drainage from mouth Grooming: Wash/dry face,Moderate assistance,Sitting Grooming Details (indicate cue type and reason): Pt particiapting in washing her face while seated at sink. Requiring Max simple cues for initating each step to wash face. Processing slowly. Upper Body Bathing: Cueing for sequencing Lower Body Bathing: Cueing for safety Toilet Transfer: Minimal assistance,+2 for physical assistance,Ambulation (simulated to chair) Toilet Transfer Details (indicate cue type and reason): Min A to power up into standing. Supporting RUE for weight bearing Functional mobility during ADLs: Minimal assistance,Moderate assistance,+2 for safety/equipment,Rolling walker General ADL Comments: Focused on mobility to/from sink and then sequencing ADL at sink. Pt continues to present with decreased balance, strength, inattention, and cognition     Mobility   Overal bed mobility: Needs Assistance Bed Mobility: Supine to Sit Rolling: Mod assist Sidelying to sit: Min guard,HOB elevated General bed mobility comments: increased time and use of bed rails     Transfers   Overall transfer level: Needs assistance Equipment used: Rolling walker (2 wheeled) Transfers: Sit to/from Stand Sit to Stand: Min assist Stand pivot transfers: Min assist General transfer comment: minA with HHA for initial transfers. Pt with modA later in session possibly due to fatigue     Ambulation / Gait / Stairs / Wheelchair Mobility   Ambulation/Gait Ambulation/Gait  assistance: Herbalist (Feet): 35 Feet Assistive device: Rolling walker (2 wheeled) Gait Pattern/deviations: Step-to pattern General Gait Details: pt with shortened step-to gait, able to maintain RUE on RW but having difficulty utilizing it to steer, often drifting toward R side Gait velocity: reduced Gait velocity interpretation: <1.31 ft/sec, indicative of household ambulator     Posture / Balance Dynamic Sitting Balance Sitting balance - Comments: supervsion for safety EOB Balance Overall balance assessment: Needs assistance Sitting-balance support: No upper extremity supported,Feet supported Sitting balance-Leahy Scale: Fair Sitting balance - Comments: supervsion for safety EOB Standing balance support: Bilateral upper extremity supported Standing balance-Leahy Scale: Poor Standing balance comment: reliant on UE support of RW     Special needs/care consideration Bowel and Bladder incontinence, [place] and Designated visitor Shae Hinnenkamp, daughter        Previous Home Environment (from acute therapy documentation) Living Arrangements: Children,Other relatives  Lives With: Daughter,Other (Comment) Available Help at Discharge: Family,Friend(s) Type of Home: [place]: Two level,Able to live on main level with bedroom/bathroom Home Access: Ramped entrance Bathroom Shower/Tub: Chiropodist: Standard Bathroom Accessibility: Yes How Accessible: Accessible via wheelchair,Accessible via walker [place]: No Additional Comments: pt with expressive and receptive aphasia, unable to report history, no family present at the time of evaluation   Discharge Living Setting Plans for Discharge Living Setting: Patient's home Type of Home at Discharge: [facility] Discharge  Home Layout: Two level,Able to live on main level with bedroom/bathroom Discharge Home Access: Ramped entrance Discharge Bathroom Shower/Tub: Tub/shower unit Discharge  Bathroom Toilet: Standard Discharge Bathroom Accessibility: Yes How Accessible: Accessible via walker,Accessible via wheelchair   Social/Family/Support Systems Patient Roles: Other (Comment) Contact Information: [phone] Anticipated Caregiver: Alvy Beal Anticipated Caregiver's Contact Information: [phone] Ability/Limitations of Caregiver: Daughter works during the day, Pt.'s friend plans to stay with her during days (a retired Therapist, sports) Careers adviser: 24/7 Discharge Plan Discussed with Primary Caregiver: Yes Is Caregiver In Agreement with Plan?: Yes Does Caregiver/Family have Issues with Lodging/Transportation while Pt is in Rehab?: Yes     Goals Patient/Family Goal for Rehab: PT/OT/SLP supervision Expected length of stay: 10-14 days Pt/Family Agrees to Admission and willing to participate: Yes Program Orientation Provided & Reviewed with Pt/Caregiver Including Roles  & Responsibilities: Yes     Decrease burden of Care through IP rehab admission: Specialzed equipment needs, Diet advancement, Decrease number of caregivers, Bowel and bladder program and Patient/family education     Possible need for SNF placement upon discharge:not anticipated     Patient Condition: This patient's medical and functional status has changed since the consult dated: 07/04/20 in which the Rehabilitation Physician determined and documented that the patient's condition is appropriate for intensive rehabilitative care in an inpatient rehabilitation facility. See "History of Present Illness" (above) for medical update. Functional changes are: Pt now Min G with bed mobility, Min A with transfers, and Min A 35' with gait. Patient's medical and functional status update has been discussed with the Rehabilitation physician and patient remains appropriate for inpatient rehabilitation. Will admit to inpatient rehab today.   Preadmission Screen Completed By:  Farley Ly. Staley, CCC-SLP, with day of  admission updates by  Bethel Born, CCC-SLP, 07/08/2020 11:03 AM ______________________________________________________________________   Discussed status with Dr. Posey Pronto on 07/08/20  at 11:03 AM and received approval for admission today.   Admission Coordinator: Farley Ly. Staley, with day of admission updates by  Bethel Born, time 11:03 AM Sudie Grumbling 07/08/20

## 2020-07-08 NOTE — Progress Notes (Signed)
Physical Therapy Treatment Patient Details Name: Courtney Burke MRN: [id number] DOB: 11/01/1955 Today's Date: 07/08/2020    History of Present Illness 65 y.o. female with a PMHx of HTN, DM, CKD III, asthma, Vit D deficiency, obesity s/p gastric sleeve 12/21 who presents to [facility] via [facility] as a CODE STROKE with AMS and aphasia. Pt's brother also reports multiple periods of LOC. In ED pt demonstrates L gaze and R hemiparesis. CTA demonstrates L M1 occlusion. Pt underwent L common carotid arteriogram with revascularization of L MCA on 06/30/2020.    PT Comments    Pt's tolerance for gait is limited some by what seems to be pt reports of dizziness (difficult to assess due to aphasia). Pt demonstrates improved transfer quality and is better able to manage RW although still requiring assistance. Pt will benefit from continued aggressive mobilization and PT POC to improve mobility quality and to reduce falls risk. PT continues to recommend CIR placement at this time.   Follow Up Recommendations  CIR     Equipment Recommendations  Wheelchair (measurements PT);Hospital bed;Rolling walker with 5" wheels    Recommendations for Other Services       Precautions / Restrictions Precautions Precautions: Fall Restrictions Weight Bearing Restrictions: No    Mobility  Bed Mobility Overal bed mobility: Needs Assistance Bed Mobility: Supine to Sit   Sidelying to sit: Min guard;HOB elevated       General bed mobility comments: increased time and use of bed rails  Transfers Overall transfer level: Needs assistance Equipment used: Rolling walker (2 wheeled) Transfers: Sit to/from Stand Sit to Stand: Min assist            Ambulation/Gait Ambulation/Gait assistance: Herbalist (Feet): 35 Feet Assistive device: Rolling walker (2 wheeled) Gait Pattern/deviations: Step-to pattern Gait velocity: reduced Gait velocity interpretation: <1.31 ft/sec, indicative of household  ambulator General Gait Details: pt with shortened step-to gait, able to maintain RUE on RW but having difficulty utilizing it to steer, often drifting toward R side   Stairs             Wheelchair Mobility    Modified Rankin (Stroke Patients Only) Modified Rankin (Stroke Patients Only) Pre-Morbid Rankin Score: No symptoms Modified Rankin: Moderately severe disability     Balance Overall balance assessment: Needs assistance Sitting-balance support: No upper extremity supported;Feet supported Sitting balance-Leahy Scale: Fair     Standing balance support: Bilateral upper extremity supported Standing balance-Leahy Scale: Poor Standing balance comment: reliant on UE support of RW                            Cognition Arousal/Alertness: Awake/alert Behavior During Therapy: Flat affect Overall Cognitive Status: Difficult to assess Area of Impairment: Following commands;Safety/judgement;Awareness;Problem solving                       Following Commands: Follows one step commands consistently;Follows multi-step commands inconsistently Safety/Judgement: Decreased awareness of safety Awareness: Emergent Problem Solving: Slow processing;Requires verbal cues;Requires tactile cues        Exercises      General Comments General comments (skin integrity, edema, etc.): VSS on RA, pt does reports some dizziness when mobilizing. BP of 102/73 which is not far from previous BP this morning. Symptoms seem to resolve with seated rest.      Pertinent Vitals/Pain Pain Assessment: No/denies pain    Home Living  Prior Function            PT Goals (current goals can now be found in the care plan section) Acute Rehab PT Goals Patient Stated Goal: try to fix it Progress towards PT goals: Progressing toward goals    Frequency    Min 4X/week      PT Plan Current plan remains appropriate    Co-evaluation               AM-PAC PT "6 Clicks" Mobility   Outcome Measure  Help needed turning from your back to your side while in a flat bed without using bedrails?: A Little Help needed moving from lying on your back to sitting on the side of a flat bed without using bedrails?: A Little Help needed moving to and from a bed to a chair (including a wheelchair)?: A Little Help needed standing up from a chair using your arms (e.g., wheelchair or bedside chair)?: A Little Help needed to walk in hospital room?: A Little Help needed climbing 3-5 steps with a railing? : Total 6 Click Score: 16    End of Session Equipment Utilized During Treatment: Gait belt Activity Tolerance: Patient tolerated treatment well Patient left: in chair;with call bell/phone within reach;with chair alarm set Nurse Communication: Mobility status PT Visit Diagnosis: Other abnormalities of gait and mobility (R26.89);Muscle weakness (generalized) (M62.81);Other symptoms and signs involving the nervous system (R74.081)     Time: 4481-8563 PT Time Calculation (min) (ACUTE ONLY): 23 min  Charges:  $Gait Training: 8-22 mins $Therapeutic Activity: 8-22 mins                     Zenaida Niece, PT, DPT Acute Rehabilitation Pager: [phone]    Zenaida Niece 07/08/2020, 9:53 AM

## 2020-07-08 NOTE — TOC Transition Note (Signed)
Transition of Care [facility]) - CM/SW Discharge Note   Patient Details  Name: Courtney Burke MRN: [id number] Date of Birth: 01-29-1956  [facility] of [facility]) CM/SW Contact:  Pollie Friar, RN Phone Number: 07/08/2020, 11:21 AM   Clinical Narrative:    Patient is discharging to CIR today. CM signing off.   Final next level of care: IP Rehab Facility Barriers to Discharge: No Barriers Identified   Patient Goals and CMS Choice     Choice offered to / list presented to : Patient  Discharge Placement                       Discharge Plan and Services                                     Social Determinants of Health (SDOH) Interventions     Readmission Risk Interventions No flowsheet data found.

## 2020-07-08 NOTE — Progress Notes (Signed)
Signed     Expand All Collapse All             Physical Medicine and Rehabilitation Consult Reason for Consult: Right side weakness and aphasia Referring Physician: Dr.Xu     HPI: Courtney Burke is a 65 y.o. 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  Presented 06/30/2020 with altered mental status and aphasia.  Cranial CT scan showed acute left MCA territory infarction no hemorrhage or mass-effect.  Patient did not receive TPA.  EEG negative for seizure.  CT angiogram head and neck positive for emergent large vessel occlusion.  Left MCA origin.  Some left MCA branch reconstitution  and underwent MCA stenting per interventional radiology.  MRI follow-up showed acute infarct left MCA territory involving the basal ganglia as well as left temporal frontal and parietal lobes.  Small amount of hemorrhage in the left posterior temporal lobe.  Additional small areas of acute infarct in the occipital white matter bilaterally and right cerebellum.  Echocardiogram with ejection fraction of 60 to 65% no wall motion abnormalities grade 1 diastolic dysfunction.  Currently maintained on aspirin 81 mg daily as well as Brilinta for CVA prophylaxis.  Subcutaneous Lovenox for DVT prophylaxis.  Patient is maintained on Keppra for seizure prophylaxis.  Patient is currently n.p.o. with alternative means of nutritional support as patient did pull out her NG tube there was consideration of possible need for PEG tube.  Bouts of urinary retention placed on Urecholine. Physical Medicine & Rehabilitation was consulted to assess candidacy for CIR given global aphasia and right sided weakness.      Review of Systems  Unable to perform ROS: Acuity of condition        Past Medical History:  Diagnosis Date  . Asthma    . Borderline diabetes    . Hypertension           Past Surgical History:  Procedure  Laterality Date  . ANKLE SURGERY      . CARPAL TUNNEL RELEASE      . carpel tunnel      . RADIOLOGY WITH ANESTHESIA N/A 06/30/2020    Procedure: RADIOLOGY WITH ANESTHESIA;  Surgeon: Radiologist, Medication, MD;  Location: [facility];  Service: Radiology;  Laterality: N/A;    History reviewed. No pertinent family history. Social History:  reports that she has never smoked. She has never used smokeless tobacco. She reports that she does not drink alcohol and does not use drugs. Allergies:       Allergies  Allergen Reactions  . Iodinated Diagnostic Agents Itching  . Doxycycline Nausea And Vomiting  . Sulfa Antibiotics Rash and Hives  . Codeine Hives and Swelling      Swollen tongue  . Hydrocodone-Homatropine Itching  . Ace Inhibitors Cough, Itching and Rash          Medications Prior to Admission  Medication Sig Dispense Refill  . acetaminophen (TYLENOL) 500 MG tablet Take 500-1,000 mg by mouth every 6 (six) hours as needed for headache (pain).      . naproxen sodium (ALEVE) 220 MG tablet Take 660-880 mg by mouth 2 (two) times daily as needed (pain/headaches).          Home: Home Living Family/patient expects to be discharged to:: Unsure Additional Comments: pt with expressive and receptive aphasia, unable to report history, no family present at the time of evaluation  Functional History: Prior Function Comments: unable to  determine as no family present and pt is aphasic. Based on documentation it seems the pt was able to drive her brothers car so PT assumes the pt is fairly independent. Functional Status:  Mobility: Bed Mobility Overal bed mobility: Needs Assistance Bed Mobility: Rolling,Sidelying to Sit Rolling: Max assist Sidelying to sit: Max assist General bed mobility comments: pt is able to pull through LUE to elevate trunk, otherwise requires significant assistance for LE management and use of pad to rotate hips Transfers Overall transfer level: Needs assistance Equipment  used: 2 person hand held assist Transfers: Sit to/from [employer] Sit to Stand: Mod assist,+2 physical assistance Stand pivot transfers: Mod assist,+2 physical assistance General transfer comment: pt requires initiation of sit to stand and tacilte cues/weight shift to initiate step and turn to recliner   ADL: ADL Overall ADL's : Needs assistance/impaired Eating/Feeding: NPO Eating/Feeding Details (indicate cue type and reason): pt noted to be choking on secretions sitting in bed. pt with tearful eyes nose slightly wet and drainage from mouth Grooming: Total assistance Upper Body Bathing: Cueing for sequencing Lower Body Bathing: Cueing for safety Toilet Transfer: +2 for physical assistance,Moderate assistance Toilet Transfer Details (indicate cue type and reason): simulated EOB to chair General ADL Comments: pt progressing with visual cues and automatic responses to functional task. pt does not verbalize to thearpist during session. pt closing L eye with visual tracking   Cognition: Cognition Overall Cognitive Status: Difficult to assess Arousal/Alertness: Awake/alert Orientation Level: Oriented to person Attention: Sustained Sustained Attention: Impaired Sustained Attention Impairment: Functional basic Memory:  (TBA) Awareness: Impaired Awareness Impairment: Emergent impairment Problem Solving:  (will assess further) Safety/Judgment: Impaired Cognition Arousal/Alertness: Awake/alert Behavior During Therapy: Flat affect Overall Cognitive Status: Difficult to assess General Comments: pt with expressive and receptive aphasia, does not follow verbal commands and inconsistently follows visual cues. Pt is able to repeat mobility with tactile cueing and therapist initiation. Difficult to assess due to: Impaired communication   Blood pressure (!) 143/71, pulse 73, temperature 98.4 F (36.9 C), temperature source Axillary, resp. rate 20, height 5\' 2"  (1.575 m), weight  93 kg, last menstrual period 09/28/2012, SpO2 100 %. Physical Exam Gen: no distress, normal appearing HEENT: oral mucosa pink and moist, NCAT Cardio: Reg rate Chest: normal effort, normal rate of breathing Abd: soft, non-distended Ext: no edema Psych: pleasant, normal affect Skin: intact Neuro: Patient is alert. Globally aphasic. Follows some simple commands. Overall examination limited due to aphasia. Makes eye contact and nods head yes in response to my speech. Unable to follow MMT currently, but appears to be moving all extremities antigravity    Lab Results Last 24 Hours       Results for orders placed or performed during the hospital encounter of 06/30/20 (from the past 24 hour(s))  CBC     Status: Abnormal    Collection Time: 07/03/20  5:34 AM  Result Value Ref Range    WBC 9.3 4.0 - 10.5 K/uL    RBC 2.90 (L) 3.87 - 5.11 MIL/uL    Hemoglobin 8.5 (L) 12.0 - 15.0 g/dL    HCT 25.2 (L) 36.0 - 46.0 %    MCV 86.9 80.0 - 100.0 fL    MCH 29.3 26.0 - 34.0 pg    MCHC 33.7 30.0 - 36.0 g/dL    RDW 14.1 11.5 - 15.5 %    Platelets 385 150 - 400 K/uL    nRBC 0.0 0.0 - 0.2 %  Basic metabolic panel  Status: Abnormal    Collection Time: 07/03/20  5:34 AM  Result Value Ref Range    Sodium 146 (H) 135 - 145 mmol/L    Potassium 3.8 3.5 - 5.1 mmol/L    Chloride 115 (H) 98 - 111 mmol/L    CO2 20 (L) 22 - 32 mmol/L    Glucose, Bld 107 (H) 70 - 99 mg/dL    BUN 12 8 - 23 mg/dL    Creatinine, Ser 1.26 (H) 0.44 - 1.00 mg/dL    Calcium 8.4 (L) 8.9 - 10.3 mg/dL    GFR, Estimated 47 (L) >60 mL/min    Anion gap 11 5 - 15  Glucose, capillary     Status: Abnormal    Collection Time: 07/03/20  7:42 AM  Result Value Ref Range    Glucose-Capillary 100 (H) 70 - 99 mg/dL  Glucose, capillary     Status: Abnormal    Collection Time: 07/03/20 11:33 AM  Result Value Ref Range    Glucose-Capillary 108 (H) 70 - 99 mg/dL  Glucose, capillary     Status: None    Collection Time: 07/03/20  3:30 PM  Result  Value Ref Range    Glucose-Capillary 94 70 - 99 mg/dL  Glucose, capillary     Status: None    Collection Time: 07/03/20  7:42 PM  Result Value Ref Range    Glucose-Capillary 97 70 - 99 mg/dL  Glucose, capillary     Status: None    Collection Time: 07/03/20 11:12 PM  Result Value Ref Range    Glucose-Capillary 91 70 - 99 mg/dL  Glucose, capillary     Status: None    Collection Time: 07/04/20  3:31 AM  Result Value Ref Range    Glucose-Capillary 95 70 - 99 mg/dL       Imaging Results (Last 48 hours)  CT ABDOMEN PELVIS WO CONTRAST   Result Date: 07/02/2020 CLINICAL DATA:  Retroperitoneal hematoma.  Decreased hemoglobin. EXAM: CT ABDOMEN AND PELVIS WITHOUT CONTRAST TECHNIQUE: Multidetector CT imaging of the abdomen and pelvis was performed following the standard protocol without IV contrast. COMPARISON:  CT dated 07/14/2019 FINDINGS: Lower chest: There is atelectasis versus aspiration at the lung bases.The heart is enlarged. The intracardiac blood pool is hypodense relative to the adjacent myocardium consistent with anemia. Hepatobiliary: The liver is normal. Status post cholecystectomy.There is no biliary ductal dilation. Pancreas: Normal contours without ductal dilatation. No peripancreatic fluid collection. Spleen: Unremarkable. Adrenals/Urinary Tract: --Adrenal glands: Unremarkable. --Right kidney/ureter: No hydronephrosis or radiopaque kidney stones. --Left kidney/ureter: No hydronephrosis or radiopaque kidney stones. --Urinary bladder: There is a Foley catheter in place. Stomach/Bowel: --Stomach/Duodenum: Patient is status post prior gastric bypass. The enteric tube terminates in the proximal small bowel. --Small bowel: Unremarkable. --Colon: There is a large amount of stool at the level of the rectum. --Appendix: Not visualized. No right lower quadrant inflammation or free fluid. Vascular/Lymphatic: There is fat stranding in the right inguinal region with a small retroperitoneal hematoma on  the right. --No retroperitoneal lymphadenopathy. --No mesenteric lymphadenopathy. --No pelvic or inguinal lymphadenopathy. Reproductive: Unremarkable Other: No ascites or free air. There is a small fat containing umbilical hernia. Musculoskeletal. No acute displaced fractures. IMPRESSION: 1. Fat stranding in the right inguinal region with a small retroperitoneal hematoma on the right. 2. Cardiomegaly. 3. Anemia. 4. Bibasilar atelectasis versus aspiration. 5. Large amount of stool at the level of the rectum. Electronically Signed   By: Constance Holster M.D.   On: 07/02/2020 18:17  CT HEAD WO CONTRAST   Result Date: 07/02/2020 CLINICAL DATA:  Stroke follow-up.  Intracranial hemorrhage. EXAM: CT HEAD WITHOUT CONTRAST TECHNIQUE: Contiguous axial images were obtained from the base of the skull through the vertex without intravenous contrast. COMPARISON:  CT head 06/30/2020, MRI head 07/01/2020 FINDINGS: Brain: Left MCA infarct with hypodensity in the left temporal and parietal lobe as well as in the left basal ganglia involving the caudate and putamen. Small 1 cm hemorrhage left temporal lobe unchanged from prior CT and MRI. Small focus of subarachnoid hemorrhage in the left lateral temporal lobe which was present on the prior CT. Ventricle size normal. No midline shift. No new area of infarction. Vascular: Negative for hyperdense vessel Skull: Negative Sinuses/Orbits: Paranasal sinuses clear. NG tube in place. Negative orbit. Other: None IMPRESSION: Acute left MCA infarct unchanged from prior studies. Small amount of hemorrhage left temporal lobe also unchanged. No new area of hemorrhage or infarction. Electronically Signed   By: Franchot Gallo M.D.   On: 07/02/2020 17:49    DG CHEST PORT 1 VIEW   Result Date: 07/02/2020 CLINICAL DATA:  Fever, shortness of breath, hypertension. Status post LEFT MCA thrombectomy. EXAM: PORTABLE CHEST 1 VIEW COMPARISON:  Chest x-ray dated 06/30/2020. FINDINGS: Endotracheal  tube has been removed. Enteric tube passes below the diaphragm. Heart size and mediastinal contours appear stable. Lungs are clear. No pleural effusion or pneumothorax is seen. IMPRESSION: No active disease. No evidence of pneumonia or pulmonary edema. Electronically Signed   By: Franki Cabot M.D.   On: 07/02/2020 11:01         Assessment/Plan: Diagnosis: Left MCA occlusion 1. Does the need for close, 24 hr/day medical supervision in concert with the patient's rehab needs make it unreasonable for this patient to be served in a less intensive setting? Yes 2. Co-Morbidities requiring supervision/potential complications:  1. HTN 2. DM2 3. CKD 4. Obesity 5. S/p gastric bypass 12/21 6. Confusion 7. Seizure-like activity 8. Drooling 9. Bowel incontinence 10. Left gaze preference: will require intensive PT, OT, and SLP 11. Right sided weakness: will require intensive PT and OT 12. Global aphasia: will require intensive SLP 13. Dysphagia 3. Due to bladder management, bowel management, safety, skin/wound care, disease management, medication administration, pain management and patient education, does the patient require 24 hr/day rehab nursing? Yes 4. Does the patient require coordinated care of a physician, rehab nurse, therapy disciplines of PT, OT, SLP to address physical and functional deficits in the context of the above medical diagnosis(es)? Yes Addressing deficits in the following areas: balance, endurance, locomotion, strength, transferring, bowel/bladder control, bathing, dressing, feeding, grooming, toileting, cognition, speech, language, swallowing and psychosocial support 5. Can the patient actively participate in an intensive therapy program of at least 3 hrs of therapy per day at least 5 days per week? Yes 6. The potential for patient to make measurable gains while on inpatient rehab is excellent 7. Anticipated functional outcomes upon discharge from inpatient rehab are min assist   with PT, min assist with OT, min assist with SLP. 8. Estimated rehab length of stay to reach the above functional goals is: 20-22 days 9. Anticipated discharge destination: Home 10. Overall Rehab/Functional Prognosis: good   RECOMMENDATIONS: This patient's condition is appropriate for continued rehabilitative care in the following setting: CIR Patient has agreed to participate in recommended program. Yes Note that insurance prior authorization may be required for reimbursement for recommended care.   Comment: Thank you for this consult. Admission coordinator to follow.  I have personally performed a face to face diagnostic evaluation, including, but not limited to relevant history and physical exam findings, of this patient and developed relevant assessment and plan.  Additionally, I have reviewed and concur with the physician assistant's documentation above.   Leeroy Cha, MD   Lavon Paganini [place], PA-C 07/04/2020

## 2020-07-09 ENCOUNTER — Inpatient Hospital Stay (HOSPITAL_COMMUNITY)
Admission: RE | Admit: 2020-07-09 | Discharge: 2020-07-26 | DRG: 057 | Disposition: A | Discharge status: Home Health | Payer: Medicare Other | Source: Intra-hospital | Attending: Physical Medicine & Rehabilitation | Admitting: Physical Medicine & Rehabilitation

## 2020-07-09 ENCOUNTER — Encounter (HOSPITAL_COMMUNITY): Payer: Self-pay | Admitting: Physical Medicine & Rehabilitation

## 2020-07-09 DIAGNOSIS — E785 Hyperlipidemia, unspecified: Secondary | ICD-10-CM

## 2020-07-09 DIAGNOSIS — J45909 Unspecified asthma, uncomplicated: Secondary | ICD-10-CM | POA: Diagnosis present

## 2020-07-09 DIAGNOSIS — F419 Anxiety disorder, unspecified: Secondary | ICD-10-CM | POA: Diagnosis present

## 2020-07-09 DIAGNOSIS — Z7982 Long term (current) use of aspirin: Secondary | ICD-10-CM | POA: Diagnosis not present

## 2020-07-09 DIAGNOSIS — I639 Cerebral infarction, unspecified: Secondary | ICD-10-CM | POA: Diagnosis not present

## 2020-07-09 DIAGNOSIS — N183 Chronic kidney disease, stage 3 unspecified: Secondary | ICD-10-CM | POA: Diagnosis present

## 2020-07-09 DIAGNOSIS — E1122 Type 2 diabetes mellitus with diabetic chronic kidney disease: Secondary | ICD-10-CM | POA: Diagnosis present

## 2020-07-09 DIAGNOSIS — I69391 Dysphagia following cerebral infarction: Secondary | ICD-10-CM

## 2020-07-09 DIAGNOSIS — K5900 Constipation, unspecified: Secondary | ICD-10-CM | POA: Diagnosis present

## 2020-07-09 DIAGNOSIS — Z79899 Other long term (current) drug therapy: Secondary | ICD-10-CM

## 2020-07-09 DIAGNOSIS — N1832 Chronic kidney disease, stage 3b: Secondary | ICD-10-CM | POA: Diagnosis present

## 2020-07-09 DIAGNOSIS — I63512 Cerebral infarction due to unspecified occlusion or stenosis of left middle cerebral artery: Secondary | ICD-10-CM | POA: Diagnosis present

## 2020-07-09 DIAGNOSIS — Z9049 Acquired absence of other specified parts of digestive tract: Secondary | ICD-10-CM | POA: Diagnosis not present

## 2020-07-09 DIAGNOSIS — E669 Obesity, unspecified: Secondary | ICD-10-CM

## 2020-07-09 DIAGNOSIS — K219 Gastro-esophageal reflux disease without esophagitis: Secondary | ICD-10-CM | POA: Diagnosis present

## 2020-07-09 DIAGNOSIS — E1169 Type 2 diabetes mellitus with other specified complication: Secondary | ICD-10-CM | POA: Diagnosis not present

## 2020-07-09 DIAGNOSIS — Z4659 Encounter for fitting and adjustment of other gastrointestinal appliance and device: Secondary | ICD-10-CM

## 2020-07-09 DIAGNOSIS — Z9884 Bariatric surgery status: Secondary | ICD-10-CM

## 2020-07-09 DIAGNOSIS — I129 Hypertensive chronic kidney disease with stage 1 through stage 4 chronic kidney disease, or unspecified chronic kidney disease: Secondary | ICD-10-CM | POA: Diagnosis present

## 2020-07-09 DIAGNOSIS — R339 Retention of urine, unspecified: Secondary | ICD-10-CM | POA: Diagnosis present

## 2020-07-09 DIAGNOSIS — Z6836 Body mass index (BMI) 36.0-36.9, adult: Secondary | ICD-10-CM

## 2020-07-09 DIAGNOSIS — E559 Vitamin D deficiency, unspecified: Secondary | ICD-10-CM | POA: Diagnosis present

## 2020-07-09 DIAGNOSIS — I69320 Aphasia following cerebral infarction: Principal | ICD-10-CM

## 2020-07-09 DIAGNOSIS — R1310 Dysphagia, unspecified: Secondary | ICD-10-CM | POA: Diagnosis present

## 2020-07-09 DIAGNOSIS — R109 Unspecified abdominal pain: Secondary | ICD-10-CM

## 2020-07-09 HISTORY — DX: Cerebral infarction due to unspecified occlusion or stenosis of left middle cerebral artery: I63.512

## 2020-07-09 LAB — GLUCOSE, CAPILLARY
Glucose-Capillary: 93 mg/dL (ref 70–99)
Glucose-Capillary: 84 mg/dL (ref 70–99)
Glucose-Capillary: 133 mg/dL — ABNORMAL HIGH (ref 70–99)
Glucose-Capillary: 106 mg/dL — ABNORMAL HIGH (ref 70–99)
Glucose-Capillary: 138 mg/dL — ABNORMAL HIGH (ref 70–99)

## 2020-07-09 MED ORDER — ENSURE ENLIVE PO LIQD
237.0000 mL | Freq: Two times a day (BID) | ORAL | Status: DC
  Administered 2020-07-10 – 2020-07-20 (×12): 237 mL via ORAL
  Filled 2020-07-09: qty 237

## 2020-07-09 MED ORDER — BETHANECHOL CHLORIDE 10 MG PO TABS
10.0000 mg | ORAL_TABLET | Freq: Three times a day (TID) | ORAL | Status: DC
  Administered 2020-07-09 – 2020-07-20 (×32): 10 mg
  Filled 2020-07-09 (×32): qty 1

## 2020-07-09 MED ORDER — OSMOLITE 1.2 CAL PO LIQD: 1000.0000 mL | ORAL | Status: DC

## 2020-07-09 MED ORDER — ACETAMINOPHEN 160 MG/5ML PO SOLN
650.0000 mg | ORAL | Status: DC | PRN
Start: 2020-07-09 — End: 2020-07-26
  Administered 2020-07-10 – 2020-07-12 (×3): 650 mg
  Filled 2020-07-09 (×6): qty 20.3

## 2020-07-09 MED ORDER — TICAGRELOR 90 MG PO TABS
90.0000 mg | ORAL_TABLET | Freq: Two times a day (BID) | ORAL | Status: DC
  Administered 2020-07-09 – 2020-07-26 (×18): 90 mg
  Filled 2020-07-09 (×26): qty 1

## 2020-07-09 MED ORDER — ACETAMINOPHEN 650 MG RE SUPP: 650.0000 mg | RECTAL | Status: DC | PRN

## 2020-07-09 MED ORDER — OSMOLITE 1.2 CAL PO LIQD
1000.0000 mL | ORAL | Status: DC
  Administered 2020-07-09 – 2020-07-10 (×2): 1000 mL
  Filled 2020-07-09: qty 1000

## 2020-07-09 MED ORDER — POLYETHYLENE GLYCOL 3350 17 G PO PACK
17.0000 g | PACK | Freq: Every day | ORAL | Status: DC
  Administered 2020-07-13 – 2020-07-26 (×14): 17 g
  Filled 2020-07-09 (×15): qty 1

## 2020-07-09 MED ORDER — CALCIUM CITRATE 950 (200 CA) MG PO TABS
200.0000 mg | ORAL_TABLET | Freq: Three times a day (TID) | ORAL | Status: DC
  Administered 2020-07-09 – 2020-07-26 (×50): 200 mg
  Filled 2020-07-09 (×52): qty 1

## 2020-07-09 MED ORDER — PANTOPRAZOLE SODIUM 40 MG PO PACK
40.0000 mg | PACK | Freq: Every day | ORAL | Status: DC
  Administered 2020-07-10 – 2020-07-26 (×17): 40 mg
  Filled 2020-07-09 (×17): qty 20

## 2020-07-09 MED ORDER — SENNOSIDES-DOCUSATE SODIUM 8.6-50 MG PO TABS
1.0000 | ORAL_TABLET | Freq: Every day | ORAL | Status: DC
  Administered 2020-07-09 – 2020-07-25 (×15): 1
  Filled 2020-07-09 (×16): qty 1

## 2020-07-09 MED ORDER — TICAGRELOR 90 MG PO TABS
90.0000 mg | ORAL_TABLET | Freq: Two times a day (BID) | ORAL | Status: DC
Start: 2020-07-09 — End: 2020-07-26
  Administered 2020-07-16 – 2020-07-25 (×16): 90 mg via ORAL
  Filled 2020-07-09 (×30): qty 1

## 2020-07-09 MED ORDER — DOCUSATE SODIUM 50 MG/5ML PO LIQD
100.0000 mg | Freq: Two times a day (BID) | ORAL | Status: DC
  Administered 2020-07-09 – 2020-07-26 (×28): 100 mg
  Filled 2020-07-09 (×32): qty 10

## 2020-07-09 MED ORDER — ATORVASTATIN CALCIUM 80 MG PO TABS
80.0000 mg | ORAL_TABLET | Freq: Every day | ORAL | Status: DC
  Administered 2020-07-09 – 2020-07-25 (×17): 80 mg
  Filled 2020-07-09 (×16): qty 1

## 2020-07-09 MED ORDER — PROSOURCE TF PO LIQD
45.0000 mL | Freq: Two times a day (BID) | ORAL | Status: DC
  Administered 2020-07-09 – 2020-07-20 (×21): 45 mL
  Filled 2020-07-09 (×22): qty 45

## 2020-07-09 MED ORDER — ASPIRIN 81 MG PO CHEW
81.0000 mg | CHEWABLE_TABLET | Freq: Every day | ORAL | Status: DC
  Administered 2020-07-11 – 2020-07-18 (×8): 81 mg
  Filled 2020-07-09 (×13): qty 1

## 2020-07-09 MED ORDER — ADULT MULTIVITAMIN W/MINERALS CH
1.0000 | ORAL_TABLET | Freq: Two times a day (BID) | ORAL | Status: DC
  Administered 2020-07-09 – 2020-07-26 (×34): 1
  Filled 2020-07-09 (×34): qty 1

## 2020-07-09 MED ORDER — FREE WATER
200.0000 mL | Status: DC
  Administered 2020-07-09 – 2020-07-13 (×21): 200 mL

## 2020-07-09 MED ORDER — ENOXAPARIN SODIUM 40 MG/0.4ML ~~LOC~~ SOLN
40.0000 mg | SUBCUTANEOUS | Status: DC
  Administered 2020-07-09 – 2020-07-24 (×16): 40 mg via SUBCUTANEOUS
  Filled 2020-07-09 (×16): qty 0.4

## 2020-07-09 MED ORDER — VITAMIN D 25 MCG (1000 UNIT) PO TABS
1000.0000 [IU] | ORAL_TABLET | Freq: Every day | ORAL | Status: DC
  Administered 2020-07-10 – 2020-07-26 (×17): 1000 [IU]
  Filled 2020-07-09 (×18): qty 1

## 2020-07-09 MED ORDER — LEVETIRACETAM 100 MG/ML PO SOLN
500.0000 mg | Freq: Two times a day (BID) | ORAL | Status: DC
  Administered 2020-07-09 – 2020-07-11 (×5): 500 mg
  Filled 2020-07-09 (×5): qty 5

## 2020-07-09 MED ORDER — RESOURCE THICKENUP CLEAR PO POWD
ORAL | Status: DC | PRN
  Filled 2020-07-09: qty 125

## 2020-07-09 MED ORDER — ALBUTEROL SULFATE (2.5 MG/3ML) 0.083% IN NEBU: 2.5000 mg | INHALATION_SOLUTION | Freq: Four times a day (QID) | RESPIRATORY_TRACT | Status: DC | PRN

## 2020-07-09 MED ORDER — ENOXAPARIN SODIUM 40 MG/0.4ML ~~LOC~~ SOLN: 40.0000 mg | SUBCUTANEOUS | Status: DC

## 2020-07-09 MED ORDER — ACETAMINOPHEN 325 MG PO TABS
650.0000 mg | ORAL_TABLET | ORAL | Status: DC | PRN
  Administered 2020-07-21: 650 mg via ORAL
  Filled 2020-07-09: qty 2

## 2020-07-09 MED ORDER — ASPIRIN 81 MG PO CHEW
81.0000 mg | CHEWABLE_TABLET | Freq: Every day | ORAL | Status: DC
  Administered 2020-07-10 – 2020-07-26 (×9): 81 mg via ORAL
  Filled 2020-07-09 (×15): qty 1

## 2020-07-09 NOTE — Progress Notes (Signed)
Nutrition Follow-up  RD working remotely.  DOCUMENTATION CODES:   Obesity unspecified  INTERVENTION:  Resume tube feeds via post-pyloric Cortrak and provide nocturnally to support PO intake: -Initiate Osmolite 1.2 Cal at 80 mL/hr x 12 hours from 1800-0600 (960 mL daily volume) -Continue PROSource TF 45 mL BID per tube -TF regimen provides 1232 kcal (77% minimum estimated needs), 75 grams of protein (83% minimum estimated needs), 787 mL H2O daily -Continue free water flush of 200 mL Q4hrs which provides a total of 1987 mL H2O daily including water in TF regimen. Free water flush can be decreased pending increase in PO fluid intake.  Continue to offer PO oral nutrition supplements: -Ensure Enlive po BID, each supplement provides 350 kcal and 20 grams of protein. Thicken to appropriate consistency. Do not provide per tube. -Magic cup TID with meals, each supplement provides 290 kcal and 9 grams of protein.  Vitamin/mineral recommendations in setting of recent gastric sleeve resection: -MVI with minerals BID -Calcium Citrate 1 tablet TID  NUTRITION DIAGNOSIS:   Inadequate oral intake related to dysphagia,decreased appetite as evidenced by meal completion < 50%.  New nutrition diagnosis.  GOAL:   Patient will meet greater than or equal to 90% of their needs  Progressing with resumption of tube feeds.  MONITOR:   PO intake,Supplement acceptance,Diet advancement,Labs,Weight trends,TF tolerance,I & O's  REASON FOR ASSESSMENT:   Consult Enteral/tube feeding initiation and management  ASSESSMENT:   Pt with PMH of HTN, DM, CKD III, vitamin D deficiency, obesity s/p gastric sleeve 12/21 admitted with L MCA s/p revascularization of occluded L MCA with stent.  1/27 s/p thrombectomy and stent placement 1/28 extubated; failed swallow; Cortrak placed 1/30 pt pulled out Cortrak 1/31 Cortrak replaced (post-pyloric) 1/31 diet advanced to dysphagia 2 with thin liquids s/p MBS 2/1  liquids downgraded to nectar-thick liquids  RD received repeat consult for TF management. Discussed with RN over the phone. Patient with aphasia and will be unable to talk over the phone. Per RN there is concern that patient is not receiving enough nutrition. Tube feeds have been held since 1/30. Per RD follow-up note on 2/1 plan was to monitor PO intake for a few days and possible restart tube feeds if intake does not improve. Per RN patient with fluctuating PO. On 2/1 patient ate 40% of her breakfast, 35% of her lunch. On 2/2 patient ate 75% breakfast. On 2/3 patient ate 40% breakfast and 40% lunch. On 2/4 patient ate 40% breakfast. RN reports Ensure that is currently ordered is being given per tube as patient is unable to drink. She reports the procedure at [facility] if patient were able to drink Ensure would be to thicken to appropriate consistency. Patient has ben unable to take any PO intake with RN today. Patient with post-pyloric Cortrak tube that remains in-place. Plan is for patient to transfer to CIR today. Will resume tube feeds for now. Per consult question regarding providing bolus feeds but patient with post-pyloric Cortrak so unable to provide bolus feeds at this time.  Yesterday patient had approximately 337 kcal and 12 grams of protein at breakfast. No other meal documentation available from yesterday.   Medications reviewed and include: vitamin D3 1000 units daily, Colace 100 mg BID, PROSource TF 45 mL BID per tube, free water 200 mL Q4hrs, Keppra, MVI 1 tablet BID, Protonix, Miralax, senna-docusate 1 tablet daily.  Labs reviewed: CBG 83-133, CO2 19, Creatinine 1.25.  I/O: no data available  Weight trend: 91.3 kg on 2/3; -  1.7 kg from 1/29  Enteral Access: Cortrak (post-pyloric) placed 1/31  Diet Order:   Diet Order            Diet - low sodium heart healthy           DIET DYS 2 Room service appropriate? No; Fluid consistency: Nectar Thick  Diet effective now                 EDUCATION NEEDS:   No education needs have been identified at this time  Skin:  Skin Assessment: Reviewed RN Assessment  Last BM:  07/07/2020 - large type 5  Height:   Ht Readings from Last 1 Encounters:  06/30/20 5\' 2"  (1.575 m)   Weight:   Wt Readings from Last 1 Encounters:  07/07/20 91.3 kg   Ideal Body Weight:  50 kg  BMI:  Body mass index is 36.81 kg/m.  Estimated Nutritional Needs:   Kcal:  1600-1800  Protein:  90-110 grams  Fluid:  > 1.6 L/day  Jacklynn Barnacle, MS, RD, LDN Pager number available on Amion

## 2020-07-09 NOTE — H&P (Signed)
Physical Medicine and Rehabilitation Admission H&P    Chief Complaint  Patient presents with  . Code Stroke  : HPI: Courtney Burke is a 65 year old right-handed female history of hypertension diet-controlled diabetes mellitus CKD stage III asthma vitamin D deficiency obesity status post gastric sleeve 05/24/2020.  History taken from chart review due to cognition.  She presented on 06/30/2020 with AMS and aphasia.  Head CT showing acute left MCA territory infarct.  No hemorrhage or mass-effect.  Patient did not receive TPA.  EEG negative for seizures.  CT angiogram of head and neck positive for emergent large vessel occlusion.  Left MCA origin.  Some left MCA branch reconstitution underwent MCA stenting per interventional radiology.  Follow-up MRI showed acute infarct left MCA territory involving the basal ganglia as well as left temporal frontal parietal lobes.  Small amount of hemorrhage in the left posterior temporal lobe.  Additional small areas of acute infarct in the occipital white matter bilaterally and right cerebellum.  Echocardiogram with ejection fraction of 60-65%.  No wall motion abnormalities grade 1 diastolic dysfunction.  Currently maintained on aspirin 81 mg daily as well as Brilinta for CVA prophylaxis.  Patient would follow-up outpatient with cardiology services to discuss loop recorder placement.  Subcutaneous Lovenox for DVT prophylaxis.  Currently maintained on Keppra for seizure prophylaxis.  Hospital course further complicated by post stroke dysphagia.  Currently on dysphagia #2 nectar thick liquid diet as well as alternative means of nutritional support.  Bouts of urinary retention placed on Urecholine.  Due to patient's decreased functional mobility and global aphasia she was admitted for a comprehensive rehab program.  Please see preadmission assessment from earlier today as well.  Review of Systems  Unable to perform ROS: Mental acuity   Past Medical History:  Diagnosis  Date  . Asthma   . Borderline diabetes   . Hypertension    Past Surgical History:  Procedure Laterality Date  . ANKLE SURGERY    . CARPAL TUNNEL RELEASE    . carpel tunnel    . IR CT HEAD LTD  06/30/2020  . IR INTRA CRAN STENT  06/30/2020  . IR PERCUTANEOUS ART THROMBECTOMY/INFUSION INTRACRANIAL INC DIAG ANGIO  06/30/2020  . RADIOLOGY WITH ANESTHESIA N/A 06/30/2020   Procedure: RADIOLOGY WITH ANESTHESIA;  Surgeon: Radiologist, Medication, MD;  Location: [facility];  Service: Radiology;  Laterality: N/A;   History reviewed. No pertinent family history.  Unable to obtain from patient. Social History:  reports that she has never smoked. She has never used smokeless tobacco. She reports that she does not drink alcohol and does not use drugs. Allergies:  Allergies  Allergen Reactions  . Iodinated Diagnostic Agents Itching  . Doxycycline Nausea And Vomiting  . Sulfa Antibiotics Rash and Hives  . Codeine Hives and Swelling    Swollen tongue  . Hydrocodone-Homatropine Itching  . Ace Inhibitors Cough, Itching and Rash   Medications Prior to Admission  Medication Sig Dispense Refill  . acetaminophen (TYLENOL) 325 MG tablet Take 2 tablets (650 mg total) by mouth every 6 (six) hours as needed for mild pain, fever or headache (or temp > 37.5 C (99.5 F)).    Marland Kitchen albuterol (PROVENTIL) (2.5 MG/3ML) 0.083% nebulizer solution Take 3 mLs (2.5 mg total) by nebulization every 6 (six) hours as needed for wheezing or shortness of breath. 75 mL 12  . aspirin 81 MG chewable tablet Chew 1 tablet (81 mg total) by mouth daily.    Marland Kitchen atorvastatin (LIPITOR) 80 MG  tablet Take 1 tablet (80 mg total) by mouth at bedtime.    . bethanechol (URECHOLINE) 10 MG tablet Take 1 tablet (10 mg total) by mouth 3 (three) times daily.    . calcium citrate (CALCITRATE - DOSED IN MG ELEMENTAL CALCIUM) 950 (200 Ca) MG tablet Take 1 tablet (200 mg of elemental calcium total) by mouth 3 (three) times daily.    . chlorhexidine gluconate,  MEDLINE KIT, (PERIDEX) 0.12 % solution 15 mLs by Mouth Rinse route 2 (two) times daily. 120 mL 0  . cholecalciferol (VITAMIN D) 25 MCG tablet Take 1 tablet (1,000 Units total) by mouth daily before breakfast.    . feeding supplement (ENSURE ENLIVE / ENSURE PLUS) LIQD Take 237 mLs by mouth 2 (two) times daily between meals. 237 mL 12  . levETIRAcetam (KEPPRA) 500 MG tablet Take 1 tablet (500 mg total) by mouth 2 (two) times daily.    . Multiple Vitamin (MULTIVITAMIN WITH MINERALS) TABS tablet Take 1 tablet by mouth 2 (two) times daily.    . Nutritional Supplements (FEEDING SUPPLEMENT, OSMOLITE 1.2 CAL,) LIQD Place 1,000 mLs into feeding tube continuous.  0  . Nutritional Supplements (FEEDING SUPPLEMENT, PROSOURCE TF,) liquid Place 45 mLs into feeding tube 2 (two) times daily.    . pantoprazole (PROTONIX) 40 MG tablet Take 1 tablet (40 mg total) by mouth daily. 90 tablet 0  . polyethylene glycol powder (MIRALAX) 17 GM/SCOOP powder Take 17 g by mouth 2 (two) times daily as needed for moderate constipation. 255 g 0  . senna-docusate (SENOKOT-S) 8.6-50 MG tablet Take 1 tablet by mouth 2 (two) times daily between meals as needed for mild constipation. 60 tablet 0  . ticagrelor (BRILINTA) 90 MG TABS tablet Take 1 tablet (90 mg total) by mouth 2 (two) times daily. 180 tablet 1  . Water For Irrigation, Sterile (FREE WATER) SOLN Place 200 mLs into feeding tube every 4 (four) hours.      Drug Regimen Review Drug regimen was reviewed and remains appropriate with no significant issues identified  Home: Home Living Family/patient expects to be discharged to:: Unsure Living Arrangements: Children,Other relatives Available Help at Discharge: Family,Friend(s) Type of Home: House Home Access: Ramped entrance Home Layout: Two level,Able to live on main level with bedroom/bathroom Bathroom Shower/Tub: Chiropodist: Standard Bathroom Accessibility: Yes Additional Comments: pt with  expressive and receptive aphasia, unable to report history, no family present at the time of evaluation  Lives With: Daughter,Other (Comment)   Functional History: Prior Function Comments: unable to determine as no family present and pt is aphasic. Based on documentation it seems the pt was able to drive her brothers car so PT assumes the pt is fairly independent.  Functional Status:  Mobility: Bed Mobility Overal bed mobility: Needs Assistance Bed Mobility: Supine to Sit Rolling: Mod assist Sidelying to sit: Min guard,HOB elevated General bed mobility comments: increased time and use of bed rails Transfers Overall transfer level: Needs assistance Equipment used: Rolling walker (2 wheeled) Transfers: Sit to/from Stand Sit to Stand: Min assist Stand pivot transfers: Min assist General transfer comment: minA with HHA for initial transfers. Pt with modA later in session possibly due to fatigue Ambulation/Gait Ambulation/Gait assistance: Min assist Gait Distance (Feet): 35 Feet Assistive device: Rolling walker (2 wheeled) Gait Pattern/deviations: Step-to pattern General Gait Details: pt with shortened step-to gait, able to maintain RUE on RW but having difficulty utilizing it to steer, often drifting toward R side Gait velocity: reduced Gait velocity interpretation: <1.31  ft/sec, indicative of household ambulator    ADL: ADL Overall ADL's : Needs assistance/impaired Eating/Feeding: NPO Eating/Feeding Details (indicate cue type and reason): pt noted to be choking on secretions sitting in bed. pt with tearful eyes nose slightly wet and drainage from mouth Grooming: Wash/dry face,Moderate assistance,Sitting Grooming Details (indicate cue type and reason): Pt particiapting in washing her face while seated at sink. Requiring Max simple cues for initating each step to wash face. Processing slowly. Upper Body Bathing: Cueing for sequencing Lower Body Bathing: Cueing for safety Toilet  Transfer: Minimal assistance,+2 for physical assistance,Ambulation (simulated to chair) Toilet Transfer Details (indicate cue type and reason): Min A to power up into standing. Supporting RUE for weight bearing Functional mobility during ADLs: Minimal assistance,Moderate assistance,+2 for safety/equipment,Rolling walker General ADL Comments: Focused on mobility to/from sink and then sequencing ADL at sink. Pt continues to present with decreased balance, strength, inattention, and cognition  Cognition: Cognition Overall Cognitive Status: Difficult to assess Arousal/Alertness: Awake/alert Orientation Level: Other (comment) (UTA) Attention: Sustained Sustained Attention: Impaired Sustained Attention Impairment: Functional basic Memory:  (TBA) Awareness: Impaired Awareness Impairment: Emergent impairment Problem Solving:  (will assess further) Safety/Judgment: Impaired Cognition Arousal/Alertness: Awake/alert Behavior During Therapy: Flat affect Overall Cognitive Status: Difficult to assess Area of Impairment: Following commands,Safety/judgement,Awareness,Problem solving Current Attention Level: Sustained Following Commands: Follows one step commands consistently,Follows multi-step commands inconsistently Safety/Judgement: Decreased awareness of safety Awareness: Emergent Problem Solving: Slow processing,Requires verbal cues,Requires tactile cues General Comments: pt needing extra time for processing. Difficult to assess due to aphasia and pt very flat today Difficult to assess due to: Impaired communication  Physical Exam: Blood pressure 140/63, pulse 89, temperature 97.6 F (36.4 C), temperature source Oral, resp. rate 19, height 5' 2"  (4.166 m), weight 91.3 kg, last menstrual period 09/28/2012, SpO2 97 %. Physical Exam Vitals reviewed.  Constitutional:      General: She is not in acute distress.    Appearance: She is obese.  HENT:     Head: Normocephalic and atraumatic.      Comments: + NG.    Right Ear: External ear normal.     Left Ear: External ear normal.     Nose: Nose normal.  Eyes:     General:        Right eye: No discharge.        Left eye: No discharge.     Extraocular Movements: Extraocular movements intact.  Cardiovascular:     Rate and Rhythm: Normal rate and regular rhythm.  Pulmonary:     Effort: Pulmonary effort is normal. No respiratory distress.     Breath sounds: No stridor.  Abdominal:     General: Abdomen is flat. Bowel sounds are normal. There is no distension.  Musculoskeletal:     Cervical back: Normal range of motion and neck supple.     Comments: No edema or tenderness in extremities  Skin:    General: Skin is warm and dry.  Neurological:     Mental Status: She is alert.     Comments: Alert Global aphasia Motor: Unable to follow commands, spontaneously moving bilateral upper extremities  Psychiatric:     Comments: Unable to assess due to aphasia     Results for orders placed or performed during the hospital encounter of 06/30/20 (from the past 48 hour(s))  Glucose, capillary     Status: None   Collection Time: 07/08/20  8:49 PM  Result Value Ref Range   Glucose-Capillary 84 70 - 99 mg/dL  Comment: Glucose reference range applies only to samples taken after fasting for at least 8 hours.   Comment 1 Notify RN    Comment 2 Document in Chart   Glucose, capillary     Status: None   Collection Time: 07/08/20 11:38 PM  Result Value Ref Range   Glucose-Capillary 83 70 - 99 mg/dL    Comment: Glucose reference range applies only to samples taken after fasting for at least 8 hours.   Comment 1 Notify RN    Comment 2 Document in Chart   Glucose, capillary     Status: None   Collection Time: 07/09/20  3:52 AM  Result Value Ref Range   Glucose-Capillary 93 70 - 99 mg/dL    Comment: Glucose reference range applies only to samples taken after fasting for at least 8 hours.   Comment 1 Notify RN    Comment 2 Document in Chart    Glucose, capillary     Status: None   Collection Time: 07/09/20  7:25 AM  Result Value Ref Range   Glucose-Capillary 84 70 - 99 mg/dL    Comment: Glucose reference range applies only to samples taken after fasting for at least 8 hours.   Comment 1 Notify RN    Comment 2 Document in Chart   Glucose, capillary     Status: Abnormal   Collection Time: 07/09/20 11:23 AM  Result Value Ref Range   Glucose-Capillary 133 (H) 70 - 99 mg/dL    Comment: Glucose reference range applies only to samples taken after fasting for at least 8 hours.   Comment 1 Notify RN    Comment 2 Document in Chart    No results found.     Medical Problem List and Plan: 1.  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   Admit to CIR 2.  Antithrombotics: -DVT/anticoagulation: Lovenox  -antiplatelet therapy: Aspirin 81 mg daily and Brilinta 90 mg twice daily 3. Pain Management: Tylenol as needed 4. Mood: Provide emotional support  -antipsychotic agents: N/A 5. Neuropsych: This patient is not capable of making decisions on her own behalf. 6. Skin/Wound Care: Routine skin checks 7. Fluids/Electrolytes/Nutrition: Routine and outs  CMP ordered for tomorrow. 8.  Poststroke dysphagia:.  Dysphagia #2 nectar liquids. Dietary follow-up  Advance diet as tolerated 9.  Seizure prophylaxis.  Keppra 500 mg twice daily.  EEG negative 10.  Urinary retention: Urecholine 10 mg 3 times daily.  Check PVR 11.  Diet-controlled diabetes mellitus.  Hemoglobin A1c 5.4.  CBGs discontinued  Monitor his increase mobility 12.  Hyperlipidemia: Lipitor 13.  Morbid obesity status post gastric sleeve 05/24/2020.  Dietary follow-up 14.  CKD stage III.  Baseline creatinine 1.41-1.59  CMP ordered for tomorrow.  Lavon Paganini Angiulli, PA-C 07/09/2020  I have personally performed a face to face diagnostic  evaluation, including, but not limited to relevant history and physical exam findings, of this patient and developed relevant assessment and plan.  Additionally, I have reviewed and concur with the physician assistant's documentation above.  Delice Lesch, MD, ABPMR  The patient's status has not changed. Any changes from the pre-admission screening or documentation from the acute chart are noted above.   Delice Lesch, MD, ABPMR

## 2020-07-09 NOTE — Progress Notes (Signed)
Patient currently has Cortrak in place, however tube feedings have been off for several days.  Intake that has been documented has been minimal (40% or less). No order seen to d/c TF.  MD notified, who then asked that dietician be notified.  Unable to notify due to inaccurate number.  MD again notified with updated information.

## 2020-07-09 NOTE — Progress Notes (Signed)
Initial Nutrition Assessment  RD working remotely.  DOCUMENTATION CODES:   Obesity unspecified  INTERVENTION:  Resume tube feeds via post-pyloric Cortrak and provide nocturnally to support PO intake: -Initiate Osmolite 1.2 Cal at 80 mL/hr x 12 hours from 1800-0600 (960 mL daily volume) -Continue PROSource TF 45 mL BID per tube -TF regimen provides 1232 kcal (77% minimum estimated needs), 75 grams of protein (83% minimum estimated needs), 787 mL H2O daily -Continue free water flush of 200 mL Q4hrs which provides a total of 1987 mL H2O daily including water in TF regimen. Free water flush can be decreased pending increase in PO fluid intake.  Continue to offer PO oral nutrition supplements: -Ensure Enlive po BID, each supplement provides 350 kcal and 20 grams of protein. Thicken to appropriate consistency. Do not provide per tube. -Magic cup TID with meals, each supplement provides 290 kcal and 9 grams of protein.  Vitamin/mineral recommendations in setting of recent gastric sleeve resection: -MVI with minerals BID -Calcium Citrate 1 tablet TID  NUTRITION DIAGNOSIS:   Inadequate oral intake related to dysphagia,decreased appetite as evidenced by meal completion < 50%.  GOAL:   Patient will meet greater than or equal to 90% of their needs  MONITOR:   PO intake,Supplement acceptance,Diet advancement,Labs,Weight trends,TF tolerance,I & O's  REASON FOR ASSESSMENT:   Consult Enteral/tube feeding initiation and management  ASSESSMENT:   65 year old female with PMHx of HTN, DM, CKD stage III, vitamin D deficiency, obesity s/p gastric sleeve resection 12/21 admitted 1/27 with L MCA s/p revascularization of occluded L MCA with stent. Admitted to CIR 2/5.   1/27 s/p thrombectomy and stent placement 1/28 extubated; failed swallow; Cortrak placed 1/30 pt pulled out Cortrak 1/31 Cortrak replaced (post-pyloric) 1/31 diet advanced to dysphagia 2 with thin liquids s/p MBS 2/1 liquids  downgraded to nectar-thick liquids  RD received another consult for TF management after patient admitted to CIR. RD had just received a consult earlier this AM for same patient when inpatient.  Discussed with RN over the phone this morning. Patient with aphasia and will be unable to talk over the phone. Per RN there is concern that patient is not receiving enough nutrition. Tube feeds have been held since 1/30. Per RD follow-up note on 2/1 plan was to monitor PO intake for a few days and possible restart tube feeds if intake does not improve. Per RN patient with fluctuating PO. On 2/1 patient ate 40% of her breakfast, 35% of her lunch. On 2/2 patient ate 75% breakfast. On 2/3 patient ate 40% breakfast and 40% lunch. On 2/4 patient ate 40% breakfast. RN reports Ensure that is currently ordered is being given per tube as patient is unable to drink. She reports the procedure at [facility] if patient were able to drink Ensure would be to thicken to appropriate consistency. Patient has ben unable to take any PO intake with RN today. Patient with post-pyloric Cortrak tube that remains in-place. Plan is for patient to transfer to CIR today. Will resume tube feeds for now. Per consult question regarding providing bolus feeds but patient with post-pyloric Cortrak so unable to provide bolus feeds at this time.  Yesterday patient had approximately 337 kcal and 12 grams of protein at breakfast. No other meal documentation available from yesterday.   Medications reviewed and include: vitamin D3 1000 units daily, Colace 100 mg BID, PROSource TF 45 mL BID per tube, free water 200 mL Q4hrs, Keppra, MVI 1 tablet BID, Protonix, Miralax, senna-docusate 1  tablet daily.  Labs reviewed: CBG 83-133, CO2 19, Creatinine 1.25.  I/O: no data available  Weight trend: 91.3 kg on 2/3; -1.7 kg from 1/29  Enteral Access: Cortrak (post-pyloric) placed 1/31  NUTRITION - FOCUSED PHYSICAL EXAM:  Unable to complete as RD is  working remotely.  Diet Order:   Diet Order            DIET DYS 2 Room service appropriate? No; Fluid consistency: Nectar Thick  Diet effective now                EDUCATION NEEDS:   No education needs have been identified at this time  Skin:  Skin Assessment: Reviewed RN Assessment  Last BM:  07/07/2020  - large type 5  Height:   Ht Readings from Last 1 Encounters:  06/30/20 5\' 2"  (1.575 m)   Weight:   Wt Readings from Last 1 Encounters:  07/07/20 91.3 kg   BMI:  There is no height or weight on file to calculate BMI.  Estimated Nutritional Needs:   Kcal:  1600-1800  Protein:  90-110 grams  Fluid:  >/= 1.6 L/day  Jacklynn Barnacle, MS, RD, LDN Pager number available on Amion

## 2020-07-10 DIAGNOSIS — I63512 Cerebral infarction due to unspecified occlusion or stenosis of left middle cerebral artery: Secondary | ICD-10-CM

## 2020-07-10 LAB — GLUCOSE, CAPILLARY
Glucose-Capillary: 108 mg/dL — ABNORMAL HIGH (ref 70–99)
Glucose-Capillary: 128 mg/dL — ABNORMAL HIGH (ref 70–99)
Glucose-Capillary: 128 mg/dL — ABNORMAL HIGH (ref 70–99)
Glucose-Capillary: 134 mg/dL — ABNORMAL HIGH (ref 70–99)
Glucose-Capillary: 139 mg/dL — ABNORMAL HIGH (ref 70–99)
Glucose-Capillary: 181 mg/dL — ABNORMAL HIGH (ref 70–99)

## 2020-07-10 NOTE — Progress Notes (Signed)
Inpatient Rehabilitation Medication Review by a Pharmacist  A complete drug regimen review was completed for this patient to identify any potential clinically significant medication issues.  Clinically significant medication issues were identified:  no  Check AMION for pharmacist assigned to patient if future medication questions/issues arise during this admission.  Time spent performing this drug regimen review (minutes):  [place], PharmD PGY1 Pharmacy Resident 07/10/2020 7:04 AM  Please check [email or web address] for unit-specific pharmacy phone numbers.

## 2020-07-10 NOTE — Evaluation (Signed)
Speech Language Pathology Assessment and Plan  Patient Details  Name: Courtney Burke MRN: [id number] Date of Birth: May 29, 1956  SLP Diagnosis: Aphasia;Dysphagia  Rehab Potential: Good ELOS: 14-16 days    Today's Date: 07/10/2020 SLP Individual Time: 1005-1100 SLP Individual Time Calculation (min): 55 min   Hospital Problem: Active Problems:   Left middle cerebral artery stroke [facility])  Past Medical History:  Past Medical History:  Diagnosis Date  . Asthma   . Borderline diabetes   . Hypertension    Past Surgical History:  Past Surgical History:  Procedure Laterality Date  . ANKLE SURGERY    . CARPAL TUNNEL RELEASE    . carpel tunnel    . IR CT HEAD LTD  06/30/2020  . IR INTRA CRAN STENT  06/30/2020  . IR PERCUTANEOUS ART THROMBECTOMY/INFUSION INTRACRANIAL INC DIAG ANGIO  06/30/2020  . 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  History taken from chart review due to cognition.  She presented on 06/30/2020 with AMS and aphasia.  Head CT showing acute left MCA territory infarct.  No hemorrhage or mass-effect.  Patient did not receive TPA.  EEG negative for seizures.  CT angiogram of head and neck positive for emergent large vessel occlusion.  Left MCA origin.  Some left MCA branch reconstitution underwent MCA stenting per interventional radiology.  Follow-up MRI showed acute infarct left MCA territory involving the basal ganglia as well as left temporal frontal parietal lobes.  Small amount of hemorrhage in the left posterior temporal lobe.  Additional small areas of acute infarct in the occipital white  matter bilaterally and right cerebellum.  Echocardiogram with ejection fraction of 60-65%.  No wall motion abnormalities grade 1 diastolic dysfunction.  Currently maintained on aspirin 81 mg daily as well as Brilinta for CVA prophylaxis.  Patient would follow-up outpatient with cardiology services to discuss loop recorder placement.  Subcutaneous Lovenox for DVT prophylaxis.  Currently maintained on Keppra for seizure prophylaxis.  Hospital course further complicated by post stroke dysphagia.  Currently on dysphagia #2 nectar thick liquid diet as well as alternative means of nutritional support.  Bouts of urinary retention placed on Urecholine.  Due to patient's decreased functional mobility and global aphasia she was admitted for a comprehensive rehab program.  Please see preadmission assessment from earlier today as well.  SLP evaluation was completed on 07/10/2020 with results as follows:   Bedside swallow evaluation: Pt presents with immediate coughing x1 over ~4 cup sips of thin liquids.  Pt had delayed throat clearing with nectar thick liquids although it is difficult to determine whether this was related to consumption of nectar thick liquids versus residual from trials of thin liquids.  Pt consumed purees without difficulty but had slightly prolonged mastication of dys 3 solids.  I recommend that pt remain on her currently prescribed diet of dys 2 textures and nectar thick liquids; however, I feel that she would benefit from implementation of the water protocol to continue working towards safe liquids advancement while maximizing hydration and comfort.    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  Pt is unable to replicate most non speech oral  motor movements but can approximate /ah/ either sustained or in sets of three.  She presents with groping behaviors as she attempts to name objects and most of her verbalizations are jargon.  She  does appear to sing at times to verbally express herself and was stimulable for pitch changes and single vowel approximations in <25% of opportunities during structured melodic intonation drills with familiar words and short phrases.  Pt struggles to follow 1 step commands or match word to object from a field of two but her yes/no responses to immediate, personally relevant questions appear to be at least 50% accurate.   Given the abovementioned deficits, pt would benefit from skilled ST while inpatient in order to maximize functional independence and reduce burden of care prior to discharge.  Anticipate that pt will need 24/7 supervision at discharge in addition to [place] follow up at next level of care.     Skilled Therapeutic Interventions          Cognitive-linguistic and bedside swallow evaluation completed with results and recommendations reviewed with patient    SLP Assessment  Patient will need skilled Speech Lanaguage Pathology Services during CIR admission    Recommendations  SLP Diet Recommendations: Dysphagia 2 (Fine chop);Nectar Liquid Administration via: Cup Medication Administration: Whole meds with puree Supervision: Patient able to self feed;Staff to assist with self feeding;Full supervision/cueing for compensatory strategies Compensations: Slow rate;Small sips/bites;Minimize environmental distractions Postural Changes and/or Swallow Maneuvers: Seated upright 90 degrees Oral Care Recommendations: Oral care BID Patient destination: Home Follow up Recommendations: Home Health SLP;24 hour supervision/assistance Equipment Recommended: To be determined    SLP Frequency 3 to 5 out of 7 days   SLP Duration  SLP Intensity  SLP Treatment/Interventions 14-16 days  Minumum of 1-2 x/day, 30 to 90 minutes  Cueing hierarchy;Functional tasks;Environmental controls;Dysphagia/aspiration precaution training;Internal/external aids;Speech/Language facilitation;Multimodal communication  approach;Patient/family education    Pain Pain Assessment Pain Scale: Faces Faces Pain Scale: Hurts a little bit Pain Type: Acute pain Pain Location: Back Pain Descriptors / Indicators: Aching Pain Intervention(s): Repositioned  Prior Functioning Cognitive/Linguistic Baseline: Within functional limits Type of Home: House  Lives With: Family (per preadmission report) Available Help at Discharge: Family;Friend(s) (per preadmission report)  SLP Evaluation Cognition Overall Cognitive Status: Difficult to assess (due to global aphasia) Arousal/Alertness: Awake/alert Orientation Level: Oriented to person Immediate Memory Recall:  (unable to assess BIMs due to pts global aphasia) Awareness: Impaired Awareness Impairment: Emergent impairment Problem Solving: Impaired Safety/Judgment: Impaired  Comprehension Auditory Comprehension Overall Auditory Comprehension: Impaired Yes/No Questions: Impaired Basic Biographical Questions: 26-50% accurate Commands: Impaired One Step Basic Commands: 0-24% accurate Interfering Components: Motor planning EffectiveTechniques: Visual/Gestural cues Visual Recognition/Discrimination Discrimination: Exceptions to [facility] Reading Comprehension Reading Status: Impaired Word level: Impaired Expression Expression Primary Mode of Expression: Verbal Verbal Expression Overall Verbal Expression: Impaired Initiation: Impaired Level of Generative/Spontaneous Verbalization: Word Naming: Impairment Confrontation: Impaired Verbal Errors: Perseveration;Jargon;Not aware of errors;Aware of errors Pragmatics: Impairment Impairments: Abnormal affect Written Expression Dominant Hand: Right Oral Motor Oral Motor/Sensory Function Overall Oral Motor/Sensory Function: Other (comment) (difficult to assess due to global aphasia) Motor Speech Overall Motor Speech: Impaired Phonation: Normal Motor Planning: Impaired Level of Impairment: Word Motor Speech Errors:  Unaware  Care Tool Care Tool Cognition Expression of Ideas and Wants Expression of Ideas and Wants: Rarely/Never expressess or very difficult - rarely/never expresses self or speech is very difficult to understand   Understanding Verbal and Non-Verbal Content Understanding Verbal and Non-Verbal Content: Sometimes understands - understands only basic conversations or simple,  direct phrases. Frequently requires cues to understand   Memory/Recall Ability *first 3 days only Memory/Recall Ability *first 3 days only: None of the above were recalled       Bedside Swallowing Assessment General Date of Onset: 06/30/20 Previous Swallow Assessment: 07/01/2020 Diet Prior to this Study: Dysphagia 2 (chopped);Nectar-thick liquids Temperature Spikes Noted: No Respiratory Status: Room air History of Recent Intubation: Yes Length of Intubations (days): 1 days Date extubated: 07/01/20 Behavior/Cognition: Alert;Cooperative;Pleasant mood;Requires cueing Oral Cavity - Dentition: Adequate natural dentition Self-Feeding Abilities: Needs assist Vision: Impaired for self-feeding Patient Positioning: Upright in bed Baseline Vocal Quality: Normal Volitional Cough: Cognitively unable to elicit Volitional Swallow: Able to elicit  Oral Care Assessment   Ice Chips   Thin Liquid Thin Liquid: Impaired Presentation: Cup Pharyngeal  Phase Impairments: Cough - Immediate Nectar Thick Nectar Thick Liquid: Impaired Presentation: Cup Pharyngeal Phase Impairments: Throat Clearing - Delayed Honey Thick   Puree Puree: Within functional limits Solid Solid: Impaired Oral Phase Impairments: Impaired mastication Oral Phase Functional Implications: Prolonged oral transit BSE Assessment Risk for Aspiration Impact on safety and function: Mild aspiration risk;Moderate aspiration risk Other Related Risk Factors: Cognitive impairment;Deconditioning  Short Term Goals: Week 1: SLP Short Term Goal 1 (Week 1): Pt  will vocalize on command in 50% of opportunities with mod assist multimodal cues. SLP Short Term Goal 2 (Week 1): Pt will replicate non speech oral movements in 25% of opportunities with max assist multimodal cues. SLP Short Term Goal 3 (Week 1): Pt will match picture to word from a field of two for 50% accuracy with max assist multimodal cues. SLP Short Term Goal 4 (Week 1): Pt will consume dys 2 textures and nectar thick liquids with min assist cues for use of swallowing precautions and minimal overt s/s of aspiration. SLP Short Term Goal 5 (Week 1): Pt will consume therapeutic trials of thin liquids with min assist cues for use of swallowing precautions and minimal overt s/s of aspiration.  Refer to Care Plan for Long Term Goals  Recommendations for other services: None   Discharge Criteria: Patient will be discharged from SLP if patient refuses treatment 3 consecutive times without medical reason, if treatment goals not met, if there is a change in medical status, if patient makes no progress towards goals or if patient is discharged from hospital.  The above assessment, treatment plan, treatment alternatives and goals were discussed and mutually agreed upon: No family available/patient unable  Jaleel Allen, Selinda Orion 07/10/2020, 1:25 PM

## 2020-07-10 NOTE — Plan of Care (Signed)
  Problem: RH Balance Goal: LTG Patient will maintain dynamic standing with ADLs (OT) Description: LTG:  Patient will maintain dynamic standing balance with assist during activities of daily living (OT)  Flowsheets (Taken 07/10/2020 1232) LTG: Pt will maintain dynamic standing balance during ADLs with: Supervision/Verbal cueing   Problem: Sit to Stand Goal: LTG:  Patient will perform sit to stand in prep for activites of daily living with assistance level (OT) Description: LTG:  Patient will perform sit to stand in prep for activites of daily living with assistance level (OT) Flowsheets (Taken 07/10/2020 1232) LTG: PT will perform sit to stand in prep for activites of daily living with assistance level: Supervision/Verbal cueing   Problem: RH Eating Goal: LTG Patient will perform eating w/assist, cues/equip (OT) Description: LTG: Patient will perform eating with assist, with/without cues using equipment (OT) Flowsheets (Taken 07/10/2020 1232) LTG: Pt will perform eating with assistance level of: Supervision/Verbal cueing   Problem: RH Grooming Goal: LTG Patient will perform grooming w/assist,cues/equip (OT) Description: LTG: Patient will perform grooming with assist, with/without cues using equipment (OT) Flowsheets (Taken 07/10/2020 1232) LTG: Pt will perform grooming with assistance level of: Supervision/Verbal cueing   Problem: RH Bathing Goal: LTG Patient will bathe all body parts with assist levels (OT) Description: LTG: Patient will bathe all body parts with assist levels (OT) Flowsheets (Taken 07/10/2020 1232) LTG: Pt will perform bathing with assistance level/cueing: Supervision/Verbal cueing   Problem: RH Dressing Goal: LTG Patient will perform upper body dressing (OT) Description: LTG Patient will perform upper body dressing with assist, with/without cues (OT). Flowsheets (Taken 07/10/2020 1232) LTG: Pt will perform upper body dressing with assistance level of: Supervision/Verbal  cueing Goal: LTG Patient will perform lower body dressing w/assist (OT) Description: LTG: Patient will perform lower body dressing with assist, with/without cues in positioning using equipment (OT) Flowsheets (Taken 07/10/2020 1232) LTG: Pt will perform lower body dressing with assistance level of: (excluding Teds) Supervision/Verbal cueing   Problem: RH Toileting Goal: LTG Patient will perform toileting task (3/3 steps) with assistance level (OT) Description: LTG: Patient will perform toileting task (3/3 steps) with assistance level (OT)  Flowsheets (Taken 07/10/2020 1232) LTG: Pt will perform toileting task (3/3 steps) with assistance level: Supervision/Verbal cueing   Problem: RH Toilet Transfers Goal: LTG Patient will perform toilet transfers w/assist (OT) Description: LTG: Patient will perform toilet transfers with assist, with/without cues using equipment (OT) Flowsheets (Taken 07/10/2020 1232) LTG: Pt will perform toilet transfers with assistance level of: Supervision/Verbal cueing   Problem: RH Tub/Shower Transfers Goal: LTG Patient will perform tub/shower transfers w/assist (OT) Description: LTG: Patient will perform tub/shower transfers with assist, with/without cues using equipment (OT) Flowsheets (Taken 07/10/2020 1232) LTG: Pt will perform tub/shower stall transfers with assistance level of: Supervision/Verbal cueing

## 2020-07-10 NOTE — Evaluation (Signed)
Occupational Therapy Assessment and Plan  Patient Details  Name: Courtney Burke MRN: [id number] Date of Birth: 03/12/1956  OT Diagnosis: abnormal posture, muscle weakness (generalized) and hemiparesis of unknown dominance Rehab Potential: Rehab Potential (ACUTE ONLY): Good ELOS: 12-14 days   Today's Date: 07/10/2020 OT Individual Time: 1102-1200 OT Individual Time Calculation (min): 58 min     Hospital Problem: Active Problems:   Left middle cerebral artery stroke [facility])   Past Medical History:  Past Medical History:  Diagnosis Date  . Asthma   . Borderline diabetes   . Hypertension    Past Surgical History:  Past Surgical History:  Procedure Laterality Date  . ANKLE SURGERY    . CARPAL TUNNEL RELEASE    . carpel tunnel    . IR CT HEAD LTD  06/30/2020  . IR INTRA CRAN STENT  06/30/2020  . IR PERCUTANEOUS ART THROMBECTOMY/INFUSION INTRACRANIAL INC DIAG ANGIO  06/30/2020  . RADIOLOGY WITH ANESTHESIA N/A 06/30/2020   Procedure: RADIOLOGY WITH ANESTHESIA;  Surgeon: Radiologist, Medication, MD;  Location: [place];  Service: Radiology;  Laterality: N/A;    Assessment & Plan Clinical Impression:  Courtney Burke is a 65 year old right-handed female history of hypertension diet-controlled diabetes mellitus CKD stage III asthma vitamin D deficiency obesity status post gastric sleeve 05/24/2020.  History taken from chart review due to cognition.  She presented on 06/30/2020 with AMS and aphasia.  Head CT showing acute left MCA territory infarct.  No hemorrhage or mass-effect.  Patient did not receive TPA.  EEG negative for seizures.  CT angiogram of head and neck positive for emergent large vessel occlusion.  Left MCA origin.  Some left MCA branch reconstitution underwent MCA stenting per interventional radiology.  Follow-up MRI showed acute infarct left MCA territory involving the basal ganglia as well as left temporal frontal parietal lobes.  Small amount of hemorrhage in the left posterior temporal  lobe.  Additional small areas of acute infarct in the occipital white matter bilaterally and right cerebellum.  Echocardiogram with ejection fraction of 60-65%.  No wall motion abnormalities grade 1 diastolic dysfunction.  Currently maintained on aspirin 81 mg daily as well as Brilinta for CVA prophylaxis.  Patient would follow-up outpatient with cardiology services to discuss loop recorder placement.  Subcutaneous Lovenox for DVT prophylaxis.  Currently maintained on Keppra for seizure prophylaxis.  Hospital course further complicated by post stroke dysphagia.  Currently on dysphagia #2 nectar thick liquid diet as well as alternative means of nutritional support.  Bouts of urinary retention placed on Urecholine.  Due to patient's decreased functional mobility and global aphasia she was admitted for a comprehensive rehab program.  Please see preadmission assessment from earlier today as well.   Patient currently requires mod with basic self-care skills secondary to muscle weakness, decreased cardiorespiratoy endurance, unbalanced muscle activation, decreased coordination and decreased motor planning, decreased attention to right, decreased initiation, decreased awareness and decreased problem solving and decreased standing balance, decreased postural control and hemiplegia.  Prior to hospitalization, pts PLOF in regards to ADLs is unknown.   Patient will benefit from skilled intervention to increase independence with basic self-care skills prior to discharge home with family support.  Anticipate patient will require 24 hour supervision and follow up home health.  OT - End of Session Endurance Deficit: Yes Endurance Deficit Description: Limited standing endurance, fell asleep within minutes of returning to bed OT Assessment Rehab Potential (ACUTE ONLY): Good OT Barriers to Discharge: Other (comments) OT Barriers to Discharge Comments: Need to figure  out d/c location + caregiver support OT Patient  demonstrates impairments in the following area(s): Balance;Perception;Safety;Cognition;Endurance;Motor;Nutrition OT Basic ADL's Functional Problem(s): Eating;Grooming;Bathing;Dressing;Toileting OT Advanced ADL's Functional Problem(s): Simple Meal Preparation OT Transfers Functional Problem(s): Toilet;Tub/Shower OT Additional Impairment(s): Fuctional Use of Upper Extremity OT Plan OT Intensity: Minimum of 1-2 x/day, 45 to 90 minutes OT Frequency: 5 out of 7 days OT Duration/Estimated Length of Stay: 12-14 days OT Treatment/Interventions: Balance/vestibular training;Discharge planning;Pain management;Self Care/advanced ADL retraining;Therapeutic Activities;UE/LE Coordination activities;Therapeutic Exercise;Patient/family education;Functional mobility training;Disease mangement/prevention;Cognitive remediation/compensation;Community reintegration;DME/adaptive equipment instruction;Neuromuscular re-education;Psychosocial support;UE/LE Strength taining/ROM OT Self Feeding Anticipated Outcome(s): Supervision OT Basic Self-Care Anticipated Outcome(s): Supervision OT Toileting Anticipated Outcome(s): Supervision OT Bathroom Transfers Anticipated Outcome(s): Supervision OT Recommendation Recommendations for Other Services: Other (comment) (n/a) Patient destination: Home (?) Follow Up Recommendations: Home health OT;24 hour supervision/assistance Equipment Recommended: To be determined   OT Evaluation Precautions/Restrictions  Precautions Precautions: Fall Precaution Comments: globally aphasic, NG tube Pain: no s/s pain during session   Home Living/Prior [place] expects to be discharged to:: Private residence Living Arrangements: Children,Other relatives Additional Comments: Pt unable to report information regarding her d/c location due to global aphasia and cognitive deficits, no family present during eval  Lives With:  (Pts d/c location undecided per most  recent admission coordinator documentation) IADL History IADL Comments: No PLOF information provided in pts chart and pt is globally aphasic and cannot provide PLOF information at this time Prior Function Level of Independence:  (Unknown) Driving:  (Unknown) Vision Baseline Vision/History:  (unknown) Perception  Perception: Impaired Inattention/Neglect: Does not attend to right side of body Praxis Praxis: Impaired Praxis Impairment Details: Initiation;Motor planning Praxis-Other Comments: Most prominant during toileting, oral care, and grooming tasks Cognition Overall Cognitive Status: Difficult to assess Orientation Level:  (unable to assess due to pts global aphasia) Immediate Memory Recall:  (unable to assess BIMs due to pts global aphasia) Problem Solving: Impaired Sensation Coordination Gross Motor Movements are Fluid and Coordinated: No Fine Motor Movements are Fluid and Coordinated: No Coordination and Movement Description: Rt sided hemiparesis Finger Nose Finger Test: Pt unable to follow instruction to complete given demonstrational and HOH cues Motor  Motor Motor: Hemiplegia;Abnormal postural alignment and control  Trunk/Postural Assessment  Cervical Assessment Cervical Assessment: Exceptions to [facility] (forward head) Thoracic Assessment Thoracic Assessment: Exceptions to [facility] (kyphotic) Lumbar Assessment Lumbar Assessment: Exceptions to [facility] (posterior pelvic tilt) Postural Control Postural Control: Deficits on evaluation (limited during dynamic standing, pt with 1 posterior LOB during bathing)  Balance Balance Balance Assessed: Yes Dynamic Sitting Balance Dynamic Sitting - Balance Support: During functional activity Dynamic Sitting - Level of Assistance:  (donning gripper socks EOB) Dynamic Standing Balance Dynamic Standing - Balance Support: During functional activity Dynamic Standing - Level of Assistance: 4: Min assist Dynamic Standing - Balance Activities:  Lateral lean/weight shifting;Forward lean/weight shifting (LB dressing) Extremity/Trunk Assessment RUE Assessment RUE Assessment: Exceptions to [facility] (decreased coordination with pt dropping washing cloths x4 and losing grip on toothbrush when using the Rt hand to brush teeth) General Strength Comments: difficult to formally assess due to pts difficultly with following instruction outside of functional context LUE Assessment LUE Assessment: Within Functional Limits General Strength Comments: Difficult to formally assess due to pts difficultly with following instruction outside of the functional context  Care Tool Care Tool Self Care Eating    not assessed    Oral Care    Oral Care Assist Level: Moderate Assistance - Patient 50 - 74%    Bathing   Body parts  bathed by patient: Chest;Face;Front perineal area;Abdomen Body parts bathed by helper: Right arm;Left arm;Buttocks;Right upper leg;Left upper leg;Right lower leg;Left lower leg   Assist Level: Moderate Assistance - Patient 50 - 74%    Upper Body Dressing(including orthotics)   What is the patient wearing?: Pull over shirt   Assist Level: Minimal Assistance - Patient > 75%    Lower Body Dressing (excluding footwear)     Assist for lower body dressing: Moderate Assistance - Patient 50 - 74%    Putting on/Taking off footwear   What is the patient wearing?: Ted hose;Non-skid slipper socks Assist for footwear: Moderate Assistance - Patient 50 - 74%       Care Tool Toileting Toileting activity   Assist for toileting: Moderate Assistance - Patient 50 - 74%        Toilet transfer   Assist Level: Minimal Assistance - Patient > 75%       Refer to Care Plan for Long Term Goals  SHORT TERM GOAL WEEK 1 OT Short Term Goal 1 (Week 1): Pt will complete toileting with Min A OT Short Term Goal 2 (Week 1): Pt will don overhead shirt with CGA OT Short Term Goal 3 (Week 1): Pt will complete oral care with Min A and no more than min  cues for sequencing  Recommendations for other services: None    Skilled Therapeutic Intervention Skilled OT session completed with focus on initial evaluation, education on OT role/POC, and establishment of patient-centered goals.   Pt greeted in bed via SLP handoff. Given visual presentation of BSC, pt initiated transition to sitting EOB. Min A for stand pivot<BSC where pt sat before lowering clothing. With tactile cues from OT, she stood once again for OT to assist with clothing mgt. Note that while pt sat to void she sang to herself. After provided with a few minutes to void, pt ultimately unable to. OT set up basin of water in sink and a chair, tapped chair and pt initiated transfer with Min A short distance ambulation without AD. Pt completed bathing/dressing tasks with Min-Mod A given heavy use of demonstrational and visual cues due to global aphasia. She did well with presentation of clothing items, initiated using figure 4 position to don socks bilaterally. Note that she initiated using the Rt hand to reach for needed items, wash, dress, and complete oral care/grooming tasks with decreased coordination and mild dysmetria noted. 1 posterior LOB when standing to elevate pants with chair preventing fall. Short distance ambulatory transfer completed back to bed with pt exhibiting small Rt LOBs during ambulation and posterior LOB onto bed. She remained in bed at close of session, all needs within reach and bed alarm set, note that pt fell asleep before OT left her room.    ADL ADL Eating: Not assessed Grooming: Moderate assistance Where Assessed-Grooming: Sitting at sink Upper Body Bathing: Moderate assistance Where Assessed-Upper Body Bathing: Sitting at sink Lower Body Bathing: Moderate assistance Where Assessed-Lower Body Bathing: Sitting at sink;Standing at sink Upper Body Dressing: Minimal assistance Where Assessed-Upper Body Dressing: Sitting at sink Lower Body Dressing: Moderate  assistance Where Assessed-Lower Body Dressing: Sitting at sink;Standing at sink Toileting: Moderate assistance Where Assessed-Toileting: Bedside Commode Toilet Transfer: Minimal assistance Toilet Transfer Method: Ambulating;Stand pivot Toilet Transfer Equipment: Bedside commode Tub/Shower Transfer: Not assessed Social research officer, government: Not assessed   Discharge Criteria: Patient will be discharged from OT if patient refuses treatment 3 consecutive times without medical reason, if treatment goals not met, if there is  a change in medical status, if patient makes no progress towards goals or if patient is discharged from hospital.  The above assessment, treatment plan, treatment alternatives and goals were discussed and mutually agreed upon: by patient  Skeet Simmer 07/10/2020, 12:26 PM

## 2020-07-10 NOTE — Progress Notes (Signed)
[place] PHYSICAL MEDICINE & REHABILITATION PROGRESS NOTE  Subjective/Complaints: Patient seen sitting up in bed this morning.  Overnight, discussed with nursing, did not tolerate tube feeds at rate..  Tube feeds are just started, rate decreased and tolerating.  ROS: Unable to assess due to aphasia  Objective: Vital Signs: Blood pressure 115/83, pulse 79, temperature 98.5 F (36.9 C), resp. rate 18, height 5\' 2"  (1.575 m), weight 89.6 kg, last menstrual period 09/28/2012, SpO2 100 %. No results found. No results for input(s): WBC, HGB, HCT, PLT in the last 72 hours. No results for input(s): NA, K, CL, CO2, GLUCOSE, BUN, CREATININE, CALCIUM in the last 72 hours.  Intake/Output Summary (Last 24 hours) at 07/10/2020 0837 Last data filed at 07/09/2020 1658 Gross per 24 hour  Intake 350 ml  Output --  Net 350 ml        Physical Exam: BP 115/83 (BP Location: Right Arm)   Pulse 79   Temp 98.5 F (36.9 C)   Resp 18   Ht 5\' 2"  (1.575 m)   Wt 89.6 kg   LMP 09/28/2012   SpO2 100%   BMI 36.13 kg/m  Constitutional: No distress . Vital signs reviewed.  Obese. HENT: Normocephalic.  Atraumatic.  + NG. Eyes: EOMI. No discharge. Cardiovascular: No JVD.  RRR. Respiratory: Normal effort.  No stridor.  Bilateral clear to auscultation. GI: Non-distended.  BS +. Skin: Warm and dry.  Intact. Psych: Unable to assess due to aphasia Musc: No edema in extremities.  No tenderness in extremities. Neuro: Alert Global aphasia Motor: Unable to follow commands, spontaneously moving bilateral upper extremities   Assessment/Plan: 1. Functional deficits which require 3+ hours per day of interdisciplinary therapy in a comprehensive inpatient rehab setting.  Physiatrist is providing close team supervision and 24 hour management of active medical problems listed below.  Physiatrist and rehab team continue to assess barriers to discharge/monitor patient progress toward functional and medical  goals   Care Tool:  Bathing              Bathing assist       Upper Body Dressing/Undressing Upper body dressing        Upper body assist      Lower Body Dressing/Undressing Lower body dressing            Lower body assist       Toileting Toileting    Toileting assist Assist for toileting: Moderate Assistance - Patient 50 - 74%     Transfers Chair/bed transfer  Transfers assist           Locomotion Ambulation   Ambulation assist              Walk 10 feet activity   Assist           Walk 50 feet activity   Assist           Walk 150 feet activity   Assist           Walk 10 feet on uneven surface  activity   Assist           Wheelchair     Assist               Wheelchair 50 feet with 2 turns activity    Assist            Wheelchair 150 feet activity     Assist           Medical Problem List  and Plan: 1.  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  Antithrombotics: -DVT/anticoagulation: Lovenox             -antiplatelet therapy: Aspirin 81 mg daily and Brilinta 90 mg twice daily 3. Pain Management: Tylenol as needed 4. Mood: Provide emotional support             -antipsychotic agents: N/A 5. Neuropsych: This patient is not capable of making decisions on her own behalf. 6. Skin/Wound Care: Routine skin checks 7. Fluids/Electrolytes/Nutrition: Routine and outs            CMP ordered for tomorrow 8.  Poststroke dysphagia:.  Dysphagia #2 nectar liquids. Dietary follow-up             Advance diet as tolerated 9.  Seizure prophylaxis.  Keppra 500 mg twice daily.  EEG negative.  Plan on weaning 10.  Urinary retention: Urecholine 10 mg 3 times daily.  Check PVR 11.  Diet-controlled diabetes mellitus.  Hemoglobin A1c 5.4.  CBGs discontinued             Monitor with increased mobility 12.   Hyperlipidemia: Lipitor 13.  Morbid obesity status post gastric sleeve 05/24/2020.  Dietary follow-up 14.  CKD stage III.  Baseline creatinine 1.41-1.59             CMP ordered for tomorrow  LOS: 1 days A FACE TO FACE EVALUATION WAS PERFORMED  Janea Schwenn Lorie Phenix 07/10/2020, 8:37 AM

## 2020-07-10 NOTE — Evaluation (Signed)
Physical Therapy Assessment and Plan  Patient Details  Name: Courtney Burke MRN: [id number] Date of Birth: [birth year]  PT Diagnosis: Abnormality of gait, Hemiparesis dominant, Impaired cognition and Muscle weakness Rehab Potential: Fair ELOS: 14 to 16 days   Today's Date: 07/10/2020 PT Individual Time: 0802-0900 PT Individual Time Calculation (min): 58 min    Hospital Problem: Active Problems:   Left middle cerebral artery stroke [facility])   Past Medical History:  Past Medical History:  Diagnosis Date  . Asthma   . Borderline diabetes   . Hypertension    Past Surgical History:  Past Surgical History:  Procedure Laterality Date  . ANKLE SURGERY    . CARPAL TUNNEL RELEASE    . carpel tunnel    . IR CT HEAD LTD  06/30/2020  . IR INTRA CRAN STENT  06/30/2020  . IR PERCUTANEOUS ART THROMBECTOMY/INFUSION INTRACRANIAL INC DIAG ANGIO  06/30/2020  . 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  History taken from chart review due to cognition.  She presented on 06/30/2020 with AMS and aphasia.  Head CT showing acute left MCA territory infarct.  No hemorrhage or mass-effect.  Patient did not receive TPA.  EEG negative for seizures.  CT angiogram of head and neck positive for emergent large vessel occlusion.  Left MCA origin.  Some left MCA branch reconstitution underwent MCA stenting per interventional radiology.  Follow-up MRI showed acute infarct left MCA territory involving the basal ganglia as well as left temporal frontal parietal lobes.  Small amount of hemorrhage in the left posterior temporal lobe.  Additional small areas of acute  infarct in the occipital white matter bilaterally and right cerebellum.  Echocardiogram with ejection fraction of 60-65%.  No wall motion abnormalities grade 1 diastolic dysfunction.  Currently maintained on aspirin 81 mg daily as well as Brilinta for CVA prophylaxis.  Patient would follow-up outpatient with cardiology services to discuss loop recorder placement.  Subcutaneous Lovenox for DVT prophylaxis.  Currently maintained on Keppra for seizure prophylaxis.  Hospital course further complicated by post stroke dysphagia.  Currently on dysphagia #2 nectar thick liquid diet as well as alternative means of nutritional support.  Bouts of urinary retention placed on Urecholine.  Due to patient's decreased functional mobility and global aphasia she was admitted for a comprehensive rehab program.   Patient transferred to CIR on 07/09/2020 .   Patient currently requires min with mobility secondary to muscle weakness, decreased cardiorespiratoy endurance, abnormal tone and decreased coordination, decreased attention to right and decreased safety awareness.  Prior to hospitalization, patient was independent  with mobility and lived with  (unable to obtain due to global aphasia) in a   home.  Home access is   .  Patient will benefit from skilled PT intervention to maximize safe functional mobility, minimize fall risk and decrease caregiver burden for planned discharge home with 24 hour supervision.  Anticipate patient will benefit from follow up [place] at discharge.  PT - End of Session Activity Tolerance: Tolerates 30+ min activity with multiple rests Endurance Deficit: Yes Endurance Deficit Description: Limited standing endurance, fell asleep within minutes of returning to bed PT Assessment Rehab Potential (ACUTE/IP ONLY): Fair PT Barriers to Discharge: Decreased  caregiver support PT Patient demonstrates impairments in the following area(s): Balance;Endurance;Motor;Safety;Perception PT Transfers Functional  Problem(s): Bed Mobility;Bed to Chair;Car PT Locomotion Functional Problem(s): Ambulation;Stairs PT Plan PT Intensity: Minimum of 1-2 x/day ,45 to 90 minutes PT Frequency: 5 out of 7 days PT Duration Estimated Length of Stay: 14 to 16 days PT Treatment/Interventions: Ambulation/gait training;Balance/vestibular training;Discharge planning;Neuromuscular re-education;Functional mobility training;Patient/family education;Stair training;Therapeutic Activities;Therapeutic Exercise;UE/LE Strength taining/ROM;UE/LE Coordination activities;Visual/perceptual remediation/compensation PT Transfers Anticipated Outcome(s): S transfers PT Locomotion Anticipated Outcome(s): S gait, stairs PT Recommendation Follow Up Recommendations:  (TBD) Patient destination:  (TBD)   PT Evaluation Precautions/Restrictions Precautions Precautions: Fall Precaution Comments: globally aphasic, NG tube Restrictions Weight Bearing Restrictions: No General Chart Reviewed: Yes Family/Caregiver Present: No Vital Signs  Pain Pain Assessment Faces Pain Scale: No hurt Home Living/Prior Functioning Home Living Additional Comments: Pt unable to report information regarding her d/c location due to global aphasia and cognitive deficits, no family present during eval  Lives With:  (unable to obtain due to global aphasia) Prior Function Level of Independence:  (unable to obtain due to global aphasia) Driving:  (Unknown) Vision/Perception  Perception Perception: Impaired Inattention/Neglect: Does not attend to right side of body Cognition Overall Cognitive Status: Difficult to assess (global aphasia) Arousal/Alertness: Awake/alert Awareness: Impaired Problem Solving: Impaired Safety/Judgment: Impaired Sensation Sensation Light Touch:  (difficult to formally assess due to global aphasia) Coordination Gross Motor Movements are Fluid and Coordinated: No Motor  Motor Motor: Abnormal postural alignment and control  (hemiparesis)   Trunk/Postural Assessment  Cervical Assessment Cervical Assessment: Exceptions to [facility] (forward head) Thoracic Assessment Thoracic Assessment: Exceptions to [facility] Lumbar Assessment Lumbar Assessment: Exceptions to [facility] Postural Control Postural Control: Deficits on evaluation  Balance Balance Balance Assessed: Yes Static Sitting Balance Static Sitting - Balance Support: Feet supported Static Sitting - Level of Assistance: 5: Stand by assistance Static Standing Balance Static Standing - Balance Support: During functional activity Static Standing - Level of Assistance: 4: Min assist Dynamic Standing Balance Dynamic Standing - Balance Support: During functional activity Dynamic Standing - Level of Assistance: 4: Min assist Extremity Assessment B UEs as per OT evaluation.   RLE Assessment RLE Assessment: Exceptions to [facility] Passive Range of Motion (PROM) Comments: WFLs General Strength Comments: grossly 2+/5-3-/5 LLE Assessment LLE Assessment: Exceptions to [facility] Passive Range of Motion (PROM) Comments: WFLs General Strength Comments: grossly 3/5  Care Tool Care Tool Bed Mobility Roll left and right activity        Sit to lying activity   Sit to lying assist level: Contact Guard/Touching assist    Lying to sitting edge of bed activity   Lying to sitting edge of bed assist level: Contact Guard/Touching assist     Care Tool Transfers Sit to stand transfer   Sit to stand assist level: Minimal Assistance - Patient > 75%    Chair/bed transfer   Chair/bed transfer assist level: Minimal Assistance - Patient > 75%     Toilet transfer   Assist Level: Minimal Assistance - Patient > 75%    Car transfer   Car transfer assist level: Minimal Assistance - Patient > 75%      Care Tool Locomotion Ambulation   Assist level: Minimal Assistance - Patient > 75% Assistive device: Hand held assist Max distance: 25  Walk 10 feet activity   Assist level: Minimal  Assistance - Patient > 75% Assistive device: Hand held assist   Walk 50 feet with 2 turns activity Walk 50 feet with 2 turns activity did not occur: Safety/medical  concerns      Walk 150 feet activity Walk 150 feet activity did not occur: Safety/medical concerns      Walk 10 feet on uneven surfaces activity Walk 10 feet on uneven surfaces activity did not occur: Safety/medical concerns      Stairs   Assist level: Minimal Assistance - Patient > 75% Stairs assistive device: 2 hand rails Max number of stairs: 1  Walk up/down 1 step activity   Walk up/down 1 step (curb) assist level: Minimal Assistance - Patient > 75% Walk up/down 1 step or curb assistive device: 2 hand rails Walk up/down 4 steps activity did not occuR: Safety/medical concerns  Walk up/down 4 steps activity      Walk up/down 12 steps activity Walk up/down 12 steps activity did not occur: Safety/medical concerns      Pick up small objects from floor Pick up small object from the floor (from standing position) activity did not occur: Safety/medical concerns      Wheelchair Will patient use wheelchair at discharge?: No          Wheel 50 feet with 2 turns activity      Wheel 150 feet activity        Refer to Care Plan for Long Term Goals  SHORT TERM GOAL WEEK 1 PT Short Term Goal 1 (Week 1): Pt will increase bed mobility to S. PT Short Term Goal 2 (Week 1): Pt will increase transfers to c/g. PT Short Term Goal 3 (Week 1): Pt will increase ambulation with LRAD about 100 feet with c/g PT Short Term Goal 4 (Week 1): Pt will ascend/descend 4 stairs with B rails and c/g..  Recommendations for other services: None   Skilled Therapeutic Intervention PT evaluation completed and treatment plan initiated. Pt performed multiple sit to stand transfers and stand pivot transfers with min A and verbal cues. Pt ambulated 25 feet with min A and no assistive device. Pt has global aphasia, expressive greater than receptive.  Following treatment pt returned to room and left sitting up in bed with all needs within reach.   Discharge Criteria: Patient will be discharged from PT if patient refuses treatment 3 consecutive times without medical reason, if treatment goals not met, if there is a change in medical status, if patient makes no progress towards goals or if patient is discharged from hospital.  The above assessment, treatment plan, treatment alternatives and goals were discussed and mutually agreed upon: by patient  Dub Amis 07/10/2020, 12:40 PM

## 2020-07-10 NOTE — Progress Notes (Signed)
Pt heard crying and coughing and upon this nurse arrival pt vomiting yellow liquid. Sat pt in up right position and discontinued tube feed at this time. Pt resting, 560 ml of tube feed has been administered at 2.55am.

## 2020-07-11 ENCOUNTER — Inpatient Hospital Stay (HOSPITAL_COMMUNITY): Payer: Medicare Other

## 2020-07-11 ENCOUNTER — Other Ambulatory Visit: Payer: Self-pay

## 2020-07-11 LAB — COMPREHENSIVE METABOLIC PANEL
ALT: 15 U/L (ref 0–44)
AST: 31 U/L (ref 15–41)
Albumin: 3 g/dL — ABNORMAL LOW (ref 3.5–5.0)
Alkaline Phosphatase: 81 U/L (ref 38–126)
Anion gap: 10 (ref 5–15)
BUN: 32 mg/dL — ABNORMAL HIGH (ref 8–23)
CO2: 22 mmol/L (ref 22–32)
Calcium: 8.9 mg/dL (ref 8.9–10.3)
Chloride: 103 mmol/L (ref 98–111)
Creatinine, Ser: 1.33 mg/dL — ABNORMAL HIGH (ref 0.44–1.00)
GFR, Estimated: 44 mL/min — ABNORMAL LOW (ref >60)
Glucose, Bld: 124 mg/dL — ABNORMAL HIGH (ref 70–99)
Potassium: 4.4 mmol/L (ref 3.5–5.1)
Sodium: 135 mmol/L (ref 135–145)
Total Bilirubin: 0.7 mg/dL (ref 0.3–1.2)
Total Protein: 6.7 g/dL (ref 6.5–8.1)

## 2020-07-11 LAB — CBC WITH DIFFERENTIAL/PLATELET
Abs Immature Granulocytes: 0.31 10*3/uL — ABNORMAL HIGH (ref 0.00–0.07)
Basophils Absolute: 0 10*3/uL (ref 0.0–0.1)
Basophils Relative: 1 %
Eosinophils Absolute: 0.4 10*3/uL (ref 0.0–0.5)
Eosinophils Relative: 4 %
HCT: 33.2 % — ABNORMAL LOW (ref 36.0–46.0)
Hemoglobin: 10.9 g/dL — ABNORMAL LOW (ref 12.0–15.0)
Immature Granulocytes: 4 %
Lymphocytes Relative: 21 %
Lymphs Abs: 1.8 10*3/uL (ref 0.7–4.0)
MCH: 28.4 pg (ref 26.0–34.0)
MCHC: 32.8 g/dL (ref 30.0–36.0)
MCV: 86.5 fL (ref 80.0–100.0)
Monocytes Absolute: 0.6 10*3/uL (ref 0.1–1.0)
Monocytes Relative: 8 %
Neutro Abs: 5.2 10*3/uL (ref 1.7–7.7)
Neutrophils Relative %: 62 %
Platelets: 869 10*3/uL — ABNORMAL HIGH (ref 150–400)
RBC: 3.84 MIL/uL — ABNORMAL LOW (ref 3.87–5.11)
RDW: 13.7 % (ref 11.5–15.5)
WBC: 8.4 10*3/uL (ref 4.0–10.5)
nRBC: 0 % (ref 0.0–0.2)

## 2020-07-11 LAB — GLUCOSE, CAPILLARY
Glucose-Capillary: 116 mg/dL — ABNORMAL HIGH (ref 70–99)
Glucose-Capillary: 118 mg/dL — ABNORMAL HIGH (ref 70–99)
Glucose-Capillary: 145 mg/dL — ABNORMAL HIGH (ref 70–99)
Glucose-Capillary: 150 mg/dL — ABNORMAL HIGH (ref 70–99)
Glucose-Capillary: 153 mg/dL — ABNORMAL HIGH (ref 70–99)
Glucose-Capillary: 174 mg/dL — ABNORMAL HIGH (ref 70–99)

## 2020-07-11 IMAGING — DX DG ABDOMEN 1V
1 series · 2 of 2 positions shown · non-contrast
Comparison: [DATE].

CLINICAL DATA: Feeding tube placement.

EXAM:
ABDOMEN - 1 VIEW

[Series 1: abdomen kub · 0.14mm/px · 2 of 2 slices shown]
[im 1/2]
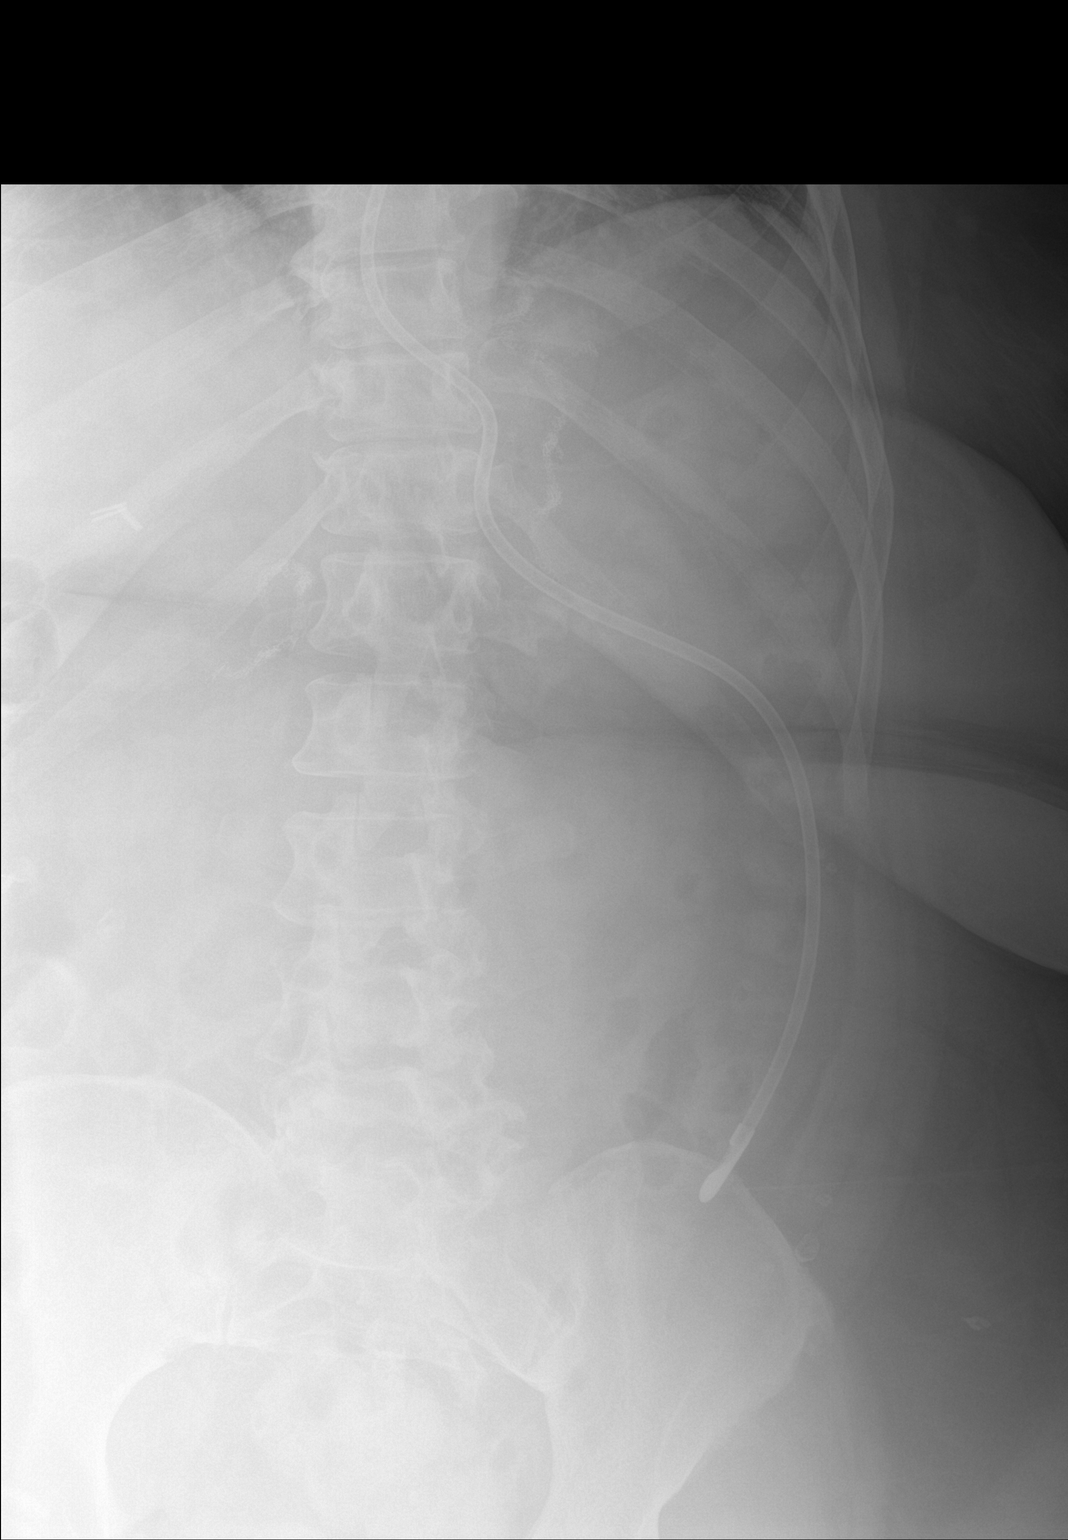
[im 2/2]
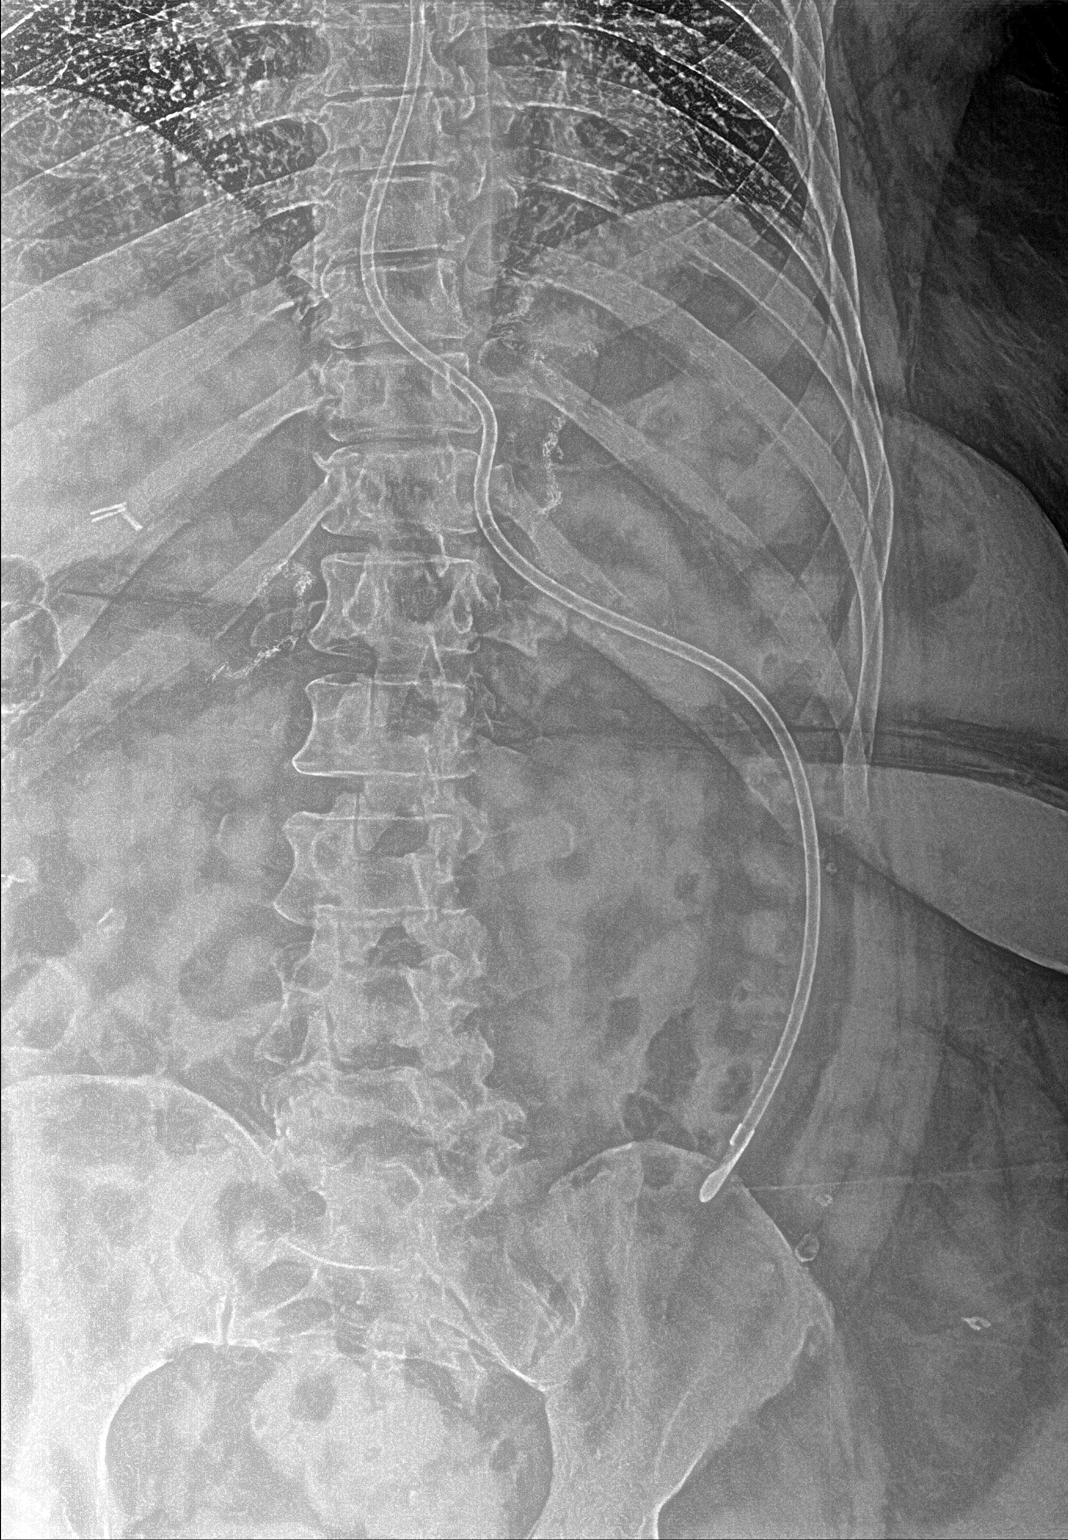

[2 of 2 positions shown; findings below may reference images not displayed]

FINDINGS: The bowel gas pattern is normal. Distal tip of feeding tube is seen
in the left side of the abdomen most consistent with proximal small
bowel loop in this patient with history of gastric bypass. No
radio-opaque calculi or other significant radiographic abnormality
are seen.
IMPRESSION: Distal tip of feeding tube seen in left side of the abdomen most
consistent with proximal small bowel loop in this patient with
history of gastric bypass.

## 2020-07-11 MED ORDER — OSMOLITE 1.2 CAL PO LIQD: 1000.0000 mL | ORAL | Status: DC

## 2020-07-11 MED ORDER — OSMOLITE 1.2 CAL PO LIQD
1000.0000 mL | ORAL | Status: DC
  Administered 2020-07-11 – 2020-07-12 (×2): 1000 mL
  Filled 2020-07-11: qty 1000

## 2020-07-11 NOTE — Progress Notes (Signed)
Physical Therapy Session Note  Patient Details  Name: Courtney Burke MRN: [id number] Date of Birth: 08/18/1955  Today's Date: 07/11/2020 PT Individual Time: 1300-1402 PT Individual Time Calculation (min): 62 min   Short Term Goals: Week 1:  PT Short Term Goal 1 (Week 1): Pt will increase bed mobility to S. PT Short Term Goal 2 (Week 1): Pt will increase transfers to c/g. PT Short Term Goal 3 (Week 1): Pt will increase ambulation with LRAD about 100 feet with c/g PT Short Term Goal 4 (Week 1): Pt will ascend/descend 4 stairs with B rails and c/g..  Skilled Therapeutic Interventions/Progress Updates:    Patient supine and asleep in bed upon PT arrival. Patient easily aroused and demonstrates acknowledgement of PT session. Patient with no demonstration of pain throughout session. During session, however, pt does become emotionally labile with no apparent reason for onset. Potentially d/t repeated request for performance of activity with pt fatigue.  Therapeutic Activity: Bed Mobility: On instruction to sit on EOB, pt pulls back covers, brings BLE to EOB and pushes UB from bed surface with CGA. Seated balance is steady with bil feet supported on floor. Some inattention noted to R side with pt tending to smoothing of clothing. In return to supine at end of session, she is able to perform with SBA and vc specifically for bringing RLE to bed surface. Provided verbal cues throughout bedmobility performance for attn to R side, positioning in bed, technique. Transfers: Patient performed STS and SPVT/ toilet transfers throughout session with CGA using either bedrail, safety rail at toilet, or armrest at w/c. She requires vc and visual cues throughout for safe hand positioning/ progression throughout all transfers.    Attempt for pt to perform 5x STS from w/c with instructions provided to pt prior to performance and pt in agreement. Maximal cueing required throughout and pt initiates performance while  singing. She takes extensive seated rest breaks between attempts. Completes 3 within 49min 32sec. Despite cueing and encouragement, she refuses to complete final 2 attempts.   Gait Training:  Patient ambulated 19' x1  Including one 180deg moderately wide turn using RW and Min A. Posture is upright initially and flexes forward with fatigue. R foot noted to drag throughout despite vc for increasing step height.  Min A required for balance especially with increase in fatigue and for walker mgmt on R side. Provided verbal cues for attn to task, increased R knee flexion in swing phase, upright posture, level, forward gaze, and proximity/ positioning of RW.  Neuromuscular Re-ed: NMR facilitated during session with focus on sustained motor control/ facilitation and praxis. Pt guided in use of Cybex Kinetron at 40cm/sec. Pt guided through bilateral, reciprocal LE glute extension push 2 x 10reps. Requires continued vc throughout for maintaining pressure throughout push. Unable to maintain throughout but is able to initiate 100% without physical assist with vc/ tc and max encouragement. All cues required for continued attn to task.  NMR performed for improvements in motor control and coordination, balance, sequencing, judgement, and self confidence/ efficacy in performing all aspects of mobility at highest level of independence.   Patient supine in bed at end of session with brakes locked, bed alarm set, and all needs within reach.  Therapy Documentation Precautions:  Precautions Precautions: Fall Precaution Comments: globally aphasic, NG tube Restrictions Weight Bearing Restrictions: No   Therapy/Group: Individual Therapy  Alger Simons 07/11/2020, 4:30 PM

## 2020-07-11 NOTE — Progress Notes (Signed)
Speech Language Pathology Daily Session Note  Patient Details  Name: Courtney Burke MRN: [id number] Date of Birth: [birth year]  Today's Date: 07/11/2020 SLP Individual Time: 0902-1000 SLP Individual Time Calculation (min): 58 min  Short Term Goals: Week 1: SLP Short Term Goal 1 (Week 1): Pt will vocalize on command in 50% of opportunities with mod assist multimodal cues. SLP Short Term Goal 2 (Week 1): Pt will replicate non speech oral movements in 25% of opportunities with max assist multimodal cues. SLP Short Term Goal 3 (Week 1): Pt will match picture to word from a field of two for 50% accuracy with max assist multimodal cues. SLP Short Term Goal 4 (Week 1): Pt will consume dys 2 textures and nectar thick liquids with min assist cues for use of swallowing precautions and minimal overt s/s of aspiration. SLP Short Term Goal 5 (Week 1): Pt will consume therapeutic trials of thin liquids with min assist cues for use of swallowing precautions and minimal overt s/s of aspiration.  Skilled Therapeutic Interventions: Pt seen for skilled ST focusing on dysphagia and communication goals. SLP facilitating treatment by providing max multimodal cues to attempt to increase replication of non speech oral movements, however pt unable to produce voluntarily. Pt did produce appropriate pitch change during "Happy Birthday" song with SLP. Pt's perseverative sounds this date were "na-ta-ka" and "na-[id number]" in response to most simple yes/no questions. Pt able to produce "na" "ta" and "ka" in structured speech tasks with ~50% accuracy provided mod cues. Simple yes/no questions with ~50% accuracy, pt speaking "yes" or "no" ~20% of attempts. Difficult to fully assess receptive language for yes/no task due to pt sometimes perseverating on head nod or nodding head while saying "no". Pt unable to name any common objects despite max cues. Pt seen with 4 oz thin liquid via tsp and cup with no overt s/s aspiration or change  in vocal quality. Pt agreeable to minimal solid trials of Dys 2 and Dys 3. Pt with mild extended mastication and reduced bolus cohesion with both textures, mild oral stasis after swallow effectively cleared with liquid wash. No overt s/s aspiration with solid trials. Cont advanced texture trials to determine fatigue factor and potential for diet upgrade when appropriate. Pt left in bed with nursing present for medication administration, bed alarm. Cont ST POC.   Pain Pain Assessment Pain Scale: Faces Pain Score: 0-No pain  Therapy/Group: Individual Therapy  Dewaine Conger 07/11/2020, 9:40 AM

## 2020-07-11 NOTE — Progress Notes (Signed)
Inpatient Rehabilitation  Patient information reviewed and entered into eRehab system by Destine Ambroise M. Kalyiah Saintil, M.A., CCC/SLP, PPS Coordinator.  Information including medical coding, functional ability and quality indicators will be reviewed and updated through discharge.    

## 2020-07-11 NOTE — Progress Notes (Signed)
Occupational Therapy Session Note  Patient Details  Name: Courtney Burke MRN: [id number] Date of Birth: [birth year]  Today's Date: 07/11/2020 OT Individual Time: 1194-1740 OT Individual Time Calculation (min): 84 min   Short Term Goals: Week 1:  OT Short Term Goal 1 (Week 1): Pt will complete toileting with Min A OT Short Term Goal 2 (Week 1): Pt will don overhead shirt with CGA OT Short Term Goal 3 (Week 1): Pt will complete oral care with Min A and no more than min cues for sequencing  Skilled Therapeutic Interventions/Progress Updates:    Pt greeted in bed with no s/s pain. With tactile cues she transitioned to EOB so that she could eat her breakfast. Note that when handed fork pt moved her food around on the plate for 30 seconds while singing. Min facilitation to use the Rt hand to manage her utensils due to decreased [place], hand strength, and Rt inattention. She consumed about 30% of her meal, did well with clearing oral cavity. With verbal cue, pt initiated ambulatory transfer towards the bathroom without AD, however after a few steps she became increasingly unsteady and backed up to the bed with 1 posterior LOB onto the bed. Used the RW for ambulatory transfer into the bathroom with pt requiring overall Mod A and manual assist for proper use of AD. She had continent bladder void after she managed her clothing, Min A for elevating pants on the Rt side after hygiene. Min A for stand pivot<w/c where she then completed hand washing and oral care while sitting at the sink. Vcs and assist for motor planning during these tasks. Min A for stand pivot<TTB in the shower where pt bathed at sit<stand level given Min A overall. Pt did well with initiating Rt hand use, needed cues to use soap/open soap bottle. Noted some perseverative tendencies when washing the same body areas repeatedly. Dressing was completed sit<stand from TTB after, pt using the grab bar for standing support with overall Min-Mod A. Once she  transferred to the w/c again, worked on bimanual coordination by changing 4 pillowcases. She returned to bed via stand pivot with Min A at close of session, left her with all needs within reach and bed alarm set. Before OT left, she stated "thank you." Tx focus today placed on functional transfers, functional cognition, ADL retraining, praxis, Rt NMR, and dynamic balance.     Note that pt sang incomprehensible words throughout session though very pleasant and cooperative  Therapy Documentation Precautions:  Precautions Precautions: Fall Precaution Comments: globally aphasic, NG tube Restrictions Weight Bearing Restrictions: No Pain: no s/s pain during tx Pain Assessment Pain Scale: Faces Pain Score: 0-No pain ADL: ADL Eating: Not assessed Grooming: Moderate assistance Where Assessed-Grooming: Sitting at sink Upper Body Bathing: Moderate assistance Where Assessed-Upper Body Bathing: Sitting at sink Lower Body Bathing: Moderate assistance Where Assessed-Lower Body Bathing: Sitting at sink,Standing at sink Upper Body Dressing: Minimal assistance Where Assessed-Upper Body Dressing: Sitting at sink Lower Body Dressing: Moderate assistance Where Assessed-Lower Body Dressing: Sitting at sink,Standing at sink Toileting: Moderate assistance Where Assessed-Toileting: Bedside Commode Toilet Transfer: Minimal assistance Toilet Transfer Method: Ambulating,Stand pivot Toilet Transfer Equipment: Bedside commode Tub/Shower Transfer: Not assessed Social research officer, government: Not assessed     Therapy/Group: Individual Therapy  Raliegh Scobie A Jeslynn Hollander 07/11/2020, 12:02 PM

## 2020-07-11 NOTE — Progress Notes (Signed)
[facility] PHYSICAL MEDICINE & REHABILITATION PROGRESS NOTE  Subjective/Complaints: As per nursing, patient was vomiting overnight and TF order was reduced. Order reviewed and decreased form rate of 80 to 55- I have messaged dietician to see if this needs to be adjusted further.  She c/o of some headache this morning which has since resolved.   ROS: Unable to assess due to aphasia  Objective: Vital Signs: Blood pressure 115/71, pulse 67, temperature 98.3 F (36.8 C), resp. rate 17, height 5\' 2"  (1.575 m), weight 89.6 kg, last menstrual period 09/28/2012, SpO2 100 %. No results found. Recent Labs    07/11/20 0637  WBC 8.4  HGB 10.9*  HCT 33.2*  PLT 869*   Recent Labs    07/11/20 0637  NA 135  K 4.4  CL 103  CO2 22  GLUCOSE 124*  BUN 32*  CREATININE 1.33*  CALCIUM 8.9    Intake/Output Summary (Last 24 hours) at 07/11/2020 1002 Last data filed at 07/11/2020 0844 Gross per 24 hour  Intake 400 ml  Output 0 ml  Net 400 ml        Physical Exam: BP 115/71 (BP Location: Right Arm)   Pulse 67   Temp 98.3 F (36.8 C)   Resp 17   Ht 5\' 2"  (1.575 m)   Wt 89.6 kg   LMP 09/28/2012   SpO2 100%   BMI 36.13 kg/m   Gen: no distress, normal appearing, Obese. HEENT: NCAT, + NG. Cardio: Reg rate Chest: normal effort, normal rate of breathing Abd: soft, non-distended Ext: no edema Psych: Unable to assess due to aphasia Musc: No edema in extremities.  No tenderness in extremities. Neuro: Alert Global aphasia Motor: Unable to follow commands, spontaneously moving bilateral upper extremities   Assessment/Plan: 1. Functional deficits which require 3+ hours per day of interdisciplinary therapy in a comprehensive inpatient rehab setting.  Physiatrist is providing close team supervision and 24 hour management of active medical problems listed below.  Physiatrist and rehab team continue to assess barriers to discharge/monitor patient progress toward functional and medical  goals   Care Tool:  Bathing    Body parts bathed by patient: Chest,Face,Front perineal area,Abdomen,Left arm,Right arm,Right upper leg,Left upper leg   Body parts bathed by helper: Buttocks,Right lower leg,Left lower leg     Bathing assist Assist Level: Minimal Assistance - Patient > 75%     Upper Body Dressing/Undressing Upper body dressing   What is the patient wearing?: Pull over shirt    Upper body assist Assist Level: Minimal Assistance - Patient > 75%    Lower Body Dressing/Undressing Lower body dressing      What is the patient wearing?: Incontinence brief,Pants     Lower body assist Assist for lower body dressing: Moderate Assistance - Patient 50 - 74%     Toileting Toileting    Toileting assist Assist for toileting: Minimal Assistance - Patient > 75%     Transfers Chair/bed transfer  Transfers assist     Chair/bed transfer assist level: Minimal Assistance - Patient > 75%     Locomotion Ambulation   Ambulation assist      Assist level: Minimal Assistance - Patient > 75% Assistive device: Hand held assist Max distance: 25   Walk 10 feet activity   Assist     Assist level: Minimal Assistance - Patient > 75% Assistive device: Hand held assist   Walk 50 feet activity   Assist Walk 50 feet with 2 turns activity did not occur:  Safety/medical concerns         Walk 150 feet activity   Assist Walk 150 feet activity did not occur: Safety/medical concerns         Walk 10 feet on uneven surface  activity   Assist Walk 10 feet on uneven surfaces activity did not occur: Safety/medical concerns         Wheelchair     Assist Will patient use wheelchair at discharge?: No             Wheelchair 50 feet with 2 turns activity    Assist            Wheelchair 150 feet activity     Assist           Medical Problem List and Plan: 1.  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  Antithrombotics: -DVT/anticoagulation: Continue Lovenox             -antiplatelet therapy: Aspirin 81 mg daily and Brilinta 90 mg twice daily 3. Pain Management: Tylenol as needed 4. Mood: Provide emotional support             -antipsychotic agents: N/A 5. Neuropsych: This patient is not capable of making decisions on her own behalf. 6. Skin/Wound Care: Routine skin checks 7. Fluids/Electrolytes/Nutrition: Routine and outs            CMP 2/7 reviewed and electrolytes are stable.  8.  Poststroke dysphagia:.  Dysphagia #2 nectar liquids. Dietary follow-up             Advance diet as tolerated  TF order decreased from 80 to 10mL/hr due to emesis, reached out to RD to see if this needs to be adjusted further.  9.  Seizure prophylaxis.  Keppra 500 mg twice daily.  EEG negative.  Plan on weaning 10.  Urinary retention: Urecholine 10 mg 3 times daily.  Check PVR 11.  Diet-controlled diabetes mellitus.  Hemoglobin A1c 5.4.  CBGs discontinued             Monitor with increased mobility 12.  Hyperlipidemia: Lipitor 13.  Morbid obesity status post gastric sleeve 05/24/2020.  Dietary follow-up 14.  CKD stage III.  Baseline creatinine 1.41-1.59             Cr 1.33 on 2/7  LOS: 2 days A FACE TO FACE EVALUATION WAS PERFORMED  Justus Droke P Rorey Bisson 07/11/2020, 10:02 AM

## 2020-07-11 NOTE — Progress Notes (Signed)
[facility] Individual Statement of Services  Patient Name:  Courtney Burke  Date:  07/11/2020  Welcome to the [facility].  Our goal is to provide you with an individualized program based on your diagnosis and situation, designed to meet your specific needs.  With this comprehensive rehabilitation program, you will be expected to participate in at least 3 hours of rehabilitation therapies Monday-Friday, with modified therapy programming on the weekends.  Your rehabilitation program will include the following services:  Physical Therapy (PT), Occupational Therapy (OT), Speech Therapy (ST), 24 hour per day rehabilitation nursing, Therapeutic Recreaction (TR), Neuropsychology, Rehabilitation Medicine, Nutrition Services, Pharmacy Services and Other  Weekly team conferences will be held on Wednesday to discuss your progress.  Your Inpatient Rehabilitation Care Coordinator will talk with you frequently to get your input and to update you on team discussions.  Team conferences with you and your family in attendance may also be held.  Expected length of stay: 10-14 Days  Overall anticipated outcome: Supervision  Depending on your progress and recovery, your program may change. Your Inpatient Rehabilitation Care Coordinator will coordinate services and will keep you informed of any changes. Your Inpatient Rehabilitation Care Coordinator's name and contact numbers are listed  below.  The following services may also be recommended but are not provided by the [facility]:    [facility] will be made to provide these services after discharge if needed.  Arrangements include referral to agencies that provide these services.  Your insurance has been verified to be:  [facility] Medicare Your primary doctor is:  [name], PA-C  Pertinent information will be  shared with your doctor and your insurance company.  Inpatient Rehabilitation Care Coordinator:  Erlene Quan, [place] or [phone]  Information discussed with and copy given to patient by: Dyanne Iha, 07/11/2020, 11:06 AM

## 2020-07-11 NOTE — Progress Notes (Signed)
Nutrition Follow-up  DOCUMENTATION CODES:   Obesity unspecified  INTERVENTION:  Will provide nocturnal tube feeds via post-pyloric Cortrak over 16 hours instead of 12. Recommend: -Osmolite 1.2 Cal at 60 mL/hr x 16 hours from 1600-0800 (960 mL goal daily volume) -Continue PROSource TF 45 mL BID per tube -TF regimen provides 1232 kcal (77% minimum estimated needs), 75 grams of protein (83% minimum estimated needs), 787 mL H2O daily -Continue free water flush of 200 mL Q4hrs which provides a total of 1987 mL H2O daily including water in TF regimen. Free water flush can be decreased pending increase in PO fluid intake.  Continue to offer PO oral nutrition supplements: -Ensure Enlive po BID, each supplement provides 350 kcal and 20 grams of protein. Thicken to appropriate consistency. Do not provide per tube. -Magic cup TID with meals, each supplement provides 290 kcal and 9 grams of protein.  Vitamin/mineral recommendations in setting of recent gastric sleeve resection: -MVI with minerals BID -Calcium Citrate 1 tablet TID  NUTRITION DIAGNOSIS:   Inadequate oral intake related to dysphagia,decreased appetite as evidenced by meal completion < 50%.  Ongoing.  GOAL:   Patient will meet greater than or equal to 90% of their needs  Progressing.  MONITOR:   PO intake,Supplement acceptance,Diet advancement,Labs,Weight trends,TF tolerance,I & O's  REASON FOR ASSESSMENT:   Consult Enteral/tube feeding initiation and management  ASSESSMENT:   65 year old female with PMHx of HTN, DM, CKD stage III, vitamin D deficiency, obesity s/p gastric sleeve resection 12/21 admitted 1/27 with L MCA s/p revascularization of occluded L MCA with stent. Admitted to CIR 2/5.  Received secure chat message from MD that patient had emesis last night so tube feed rate was reduced. Ordered repeat abdominal x-ray to verify tube placement and Cortrak tube still remains post-pyloric in small bowel. Suspect  emesis was related to PO intake as patient is recently s/p sleeve gastrectomy.   Met with patient at bedside. She is aphasic. She was able to nod her head yes to some questions. She did endorse having emesis last night. However she is unable to provide any significant history and was also not appropriate for education regarding recommended small meals s/p sleeve gastrectomy. Assessed Cortrak at bedside and it still terminates at 70 cm in left nare.  Discussed with RN. It was reported patient had projectile vomiting overnight. No information was passed on about amount of emesis or description of emesis. Per review of chart patient ate 100% of breakfast yesterday and 5% of dinner. Suspect emesis is related to PO intake. Provided education on starting with small amounts at meals and separating fluid intake 30 minutes from meals.  Medications reviewed and include: calcium citrate 200 mg TID, vitamin D3 1000 units daily, Colace 100 mg BID, Ensure Enlive po BID, PROSource TF 45 mL BID, Osmolite 1.2 Cal reduced to 55 mL/hr overnight, free water flush 200 mL Q4hrs, MVI BID, Protonix, Miralax 17 grams daily, senna-docusate 1 tablet QHS.  Labs reviewed: CBG 118-174, BUN 32, Creatinine 1.33.  I/O: 3 occurrences unmeasured UOP yesterday; no emesis documented overnight in I/O so unable to see details  NUTRITION - FOCUSED PHYSICAL EXAM:  Flowsheet Row Most Recent Value  Orbital Region No depletion  Upper Arm Region No depletion  Thoracic and Lumbar Region No depletion  Buccal Region No depletion  Temple Region No depletion  Clavicle Bone Region No depletion  Clavicle and Acromion Bone Region No depletion  Scapular Bone Region No depletion  Dorsal Hand No depletion  Patellar Region No depletion  Anterior Thigh Region No depletion  Posterior Calf Region No depletion  Edema (RD Assessment) Mild  Hair Reviewed  Eyes Reviewed  Mouth Reviewed  Skin Reviewed  Nails Reviewed     Diet Order:   Diet  Order            DIET DYS 2 Room service appropriate? No; Fluid consistency: Nectar Thick  Diet effective now                EDUCATION NEEDS:   No education needs have been identified at this time  Skin:  Skin Assessment: Reviewed RN Assessment  Last BM:  07/07/2020  - large type 5  Height:   Ht Readings from Last 1 Encounters:  07/09/20 _0  (1.575 m)   Weight:   Wt Readings from Last 1 Encounters:  07/09/20 89.6 kg   BMI:  Body mass index is 36.13 kg/m.  Estimated Nutritional Needs:   Kcal:  1600-1800  Protein:  90-110 grams  Fluid:  >/= 1.6 L/day  Jacklynn Barnacle, MS, RD, LDN Pager number available on Amion

## 2020-07-11 NOTE — Progress Notes (Signed)
Inpatient Rehabilitation Care Coordinator Assessment and Plan Patient Details  Name: Courtney Burke MRN: [id number] Date of Birth: 06/22/1955  Today's Date: 07/11/2020  Hospital Problems: Active Problems:   Left middle cerebral artery stroke (HCC)  Past Medical History:  Past Medical History:  Diagnosis Date  . Asthma   . Borderline diabetes   . Hypertension    Past Surgical History:  Past Surgical History:  Procedure Laterality Date  . ANKLE SURGERY    . CARPAL TUNNEL RELEASE    . carpel tunnel    . IR CT HEAD LTD  06/30/2020  . IR INTRA CRAN STENT  06/30/2020  . IR PERCUTANEOUS ART THROMBECTOMY/INFUSION INTRACRANIAL INC DIAG ANGIO  06/30/2020  . RADIOLOGY WITH ANESTHESIA N/A 06/30/2020   Procedure: RADIOLOGY WITH ANESTHESIA;  Surgeon: Radiologist, Medication, MD;  Location: [facility] OR;  Service: Radiology;  Laterality: N/A;   Social History:  reports that she has never smoked. She has never used smokeless tobacco. She reports that she does not drink alcohol and does not use drugs.  Family / Support Systems Marital Status: Divorced Spouse/Significant Other: Chris Children: Lakisha (Daughter) Other Supports: Jeff (Brother) Anticipated Caregiver: Lakisha Chasse Ability/Limitations of Caregiver: Works during the day, patients friend will stay with patient during the day (retired RN) Caregiver Availability: 24/7  Social History Preferred language: English Religion: Baptist Read: Yes Write: Yes Employment Status: Disabled Legal History/Current Legal Issues: n/a Guardian/Conservator: n/a   Abuse/Neglect Abuse/Neglect Assessment Can Be Completed: Yes Physical Abuse: Denies Verbal Abuse: Denies Sexual Abuse: Denies Exploitation of patient/patient's resources: Denies Self-Neglect: Denies  Emotional Status Pt's affect, behavior and adjustment status: dtr reports patient is emotional Recent Psychosocial Issues: no Psychiatric History: no Substance Abuse History: no  Patient /  Family Perceptions, Expectations & Goals Pt/Family understanding of illness & functional limitations: yes Premorbid pt/family roles/activities: Idependent and previously active in community. Anticipated changes in roles/activities/participation: Some assistance Pt/family expectations/goals: Supervision  Community Resources Community Agencies: None Premorbid Home Care/DME Agencies: None Transportation available at discharge: family able to transport Resource referrals recommended: Neuropsychology (Emotional, Crying, Coping, Agiated)  Discharge Planning Living Arrangements: Children,Other relatives (Lives with daughter) Support Systems: Spouse/significant other,Children,Other relatives Type of Residence: Private residence (2 level home, B&B on main, ramp entrance) Insurance Resources: Private Insurance (specify) ([employer] Medicare) Financial Resources: Social Security,SSD Financial Screen Referred: No Living Expenses: Lives with family Money Management: Patient,Family Does the patient have any problems obtaining your medications?: No Home Management: Independent Patient/Family Preliminary Plans: Will have assistance from family and friends (daughter and friend-retied RN) Care Coordinator Anticipated Follow Up Needs: HH/OP DC Planning Additional Notes/Comments: NPO, potenial PEG Expected length of stay: 10-14 Days  Clinical Impression SW met with patient, called daughter at bedside. Introduced self, explained role, addressed questions and concerns. Daughter concerned about patient experiencing a headache over the past few days. Daughter reports patient has been extremely emotional and crying the past to days. 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 Sw will add patient to neuro list. Daughter plans to take patient home to provide care, works during the day (patient friend will supervise patient whole daughter  works (retired RN). 2 level home, bed and bath on main, ramp entrance. Sw will continue to follow up with additional questions and concerns. Christina J [place] 07/11/2020, 12:18 PM   

## 2020-07-12 LAB — GLUCOSE, CAPILLARY
Glucose-Capillary: 104 mg/dL — ABNORMAL HIGH (ref 70–99)
Glucose-Capillary: 125 mg/dL — ABNORMAL HIGH (ref 70–99)
Glucose-Capillary: 160 mg/dL — ABNORMAL HIGH (ref 70–99)
Glucose-Capillary: 164 mg/dL — ABNORMAL HIGH (ref 70–99)
Glucose-Capillary: 168 mg/dL — ABNORMAL HIGH (ref 70–99)
Glucose-Capillary: 94 mg/dL (ref 70–99)

## 2020-07-12 MED ORDER — EXERCISE FOR HEART AND HEALTH BOOK
Freq: Once | Status: AC
  Filled 2020-07-12: qty 1

## 2020-07-12 MED ORDER — LEVETIRACETAM 100 MG/ML PO SOLN
500.0000 mg | Freq: Every day | ORAL | Status: DC
  Administered 2020-07-13: 500 mg
  Filled 2020-07-12: qty 5

## 2020-07-12 NOTE — Progress Notes (Signed)
Physical Therapy Session Note  Patient Details  Name: Courtney Burke MRN: [id number] Date of Birth: 1955-08-28  Today's Date: 07/12/2020 PT Individual Time: 0930 -1030 PT Individual Time Calculation (min): 60 min    Short Term Goals: Week 1:  PT Short Term Goal 1 (Week 1): Pt will increase bed mobility to S. PT Short Term Goal 2 (Week 1): Pt will increase transfers to c/g. PT Short Term Goal 3 (Week 1): Pt will increase ambulation with LRAD about 100 feet with c/g PT Short Term Goal 4 (Week 1): Pt will ascend/descend 4 stairs with B rails and c/g..  Skilled Therapeutic Interventions/Progress Updates:    Patient supine in bed upon PT arrival. Patient alert and agreeable to PT session. Pt has continuous feed running via NG tube which RN is able to disconnect at start of session. Patient denied pain throughout session.   Therapeutic Activity: Pt assisted with donning TED hose prior to mobility requiring Max A. Bed Mobility: Patient performed supine to/from sit with supervision and requires extra time and effort to complete. Provided verbal cues for weight shift and hand placement. Transfers: Patient performed STS transfers throughout session with SBA and SPVT transfers throughout with CGA for balance. No vc provided this day for hand progression as pt is allowed to fail in initial trial to stand with hands self placed on RW and then self corrected with at least LUE to w/c armrest and rise to stand with SBA.   Gait Training:  Patient ambulated 67' x1/ 23' x1 using RW with CGA and w/c follow for fatigue. Very slow gait throughout. Bouts stopped d/t fatigue. Provided verbal cues for centering midline in RW, increasing R step height, maintaining good proximity to walker.   Neuromuscular Re-ed: NMR facilitated during session with focus on static and dynamic standing balance. Pt guided in static activity requiring movement of colored beanbags about elevated tray table put outside of pt's BOS. Requires  vc/ tc for reaching out to steady on tray table instead of RW. Is unable to follow instructions for reaching to specific colors and instead of placing to opposite side of table, she stacks the beanbags. Sings to herself throughout activity.  No LOB noted throughout. Pt does require BUE support with alternating step up to 4" step. Completes 2 x5 reps with counting each step. Seated therapeutic rest break between bouts. NMR performed for improvements in motor control and coordination, balance, sequencing, judgement, and self confidence/ efficacy in performing all aspects of mobility at highest level of independence.   Patient supine in bed at end of session with brakes locked, bed alarm set, and all needs within reach.   Therapy Documentation Precautions:  Precautions Precautions: Fall Precaution Comments: globally aphasic, NG tube Restrictions Weight Bearing Restrictions: No    Therapy/Group: Individual Therapy  Alger Simons 07/12/2020, 4:32 PM

## 2020-07-12 NOTE — Progress Notes (Signed)
[place] PHYSICAL MEDICINE & REHABILITATION PROGRESS NOTE  Subjective/Complaints: No complaints this morning Working with SLP Engaged in conversation.  TF duration spread out to prevent nausea/emesis- appreciate RD management  ROS: Unable to assess due to aphasia  Objective: Vital Signs: Blood pressure 115/63, pulse 72, temperature 98.3 F (36.8 C), resp. rate 17, height 5\' 2"  (1.575 m), weight 89.6 kg, last menstrual period 09/28/2012, SpO2 100 %. DG Abd 1 View  Result Date: 07/11/2020 CLINICAL DATA:  Feeding tube placement. EXAM: ABDOMEN - 1 VIEW COMPARISON:  July 04, 2020. FINDINGS: The bowel gas pattern is normal. Distal tip of feeding tube is seen in the left side of the abdomen most consistent with proximal small bowel loop in this patient with history of gastric bypass. No radio-opaque calculi or other significant radiographic abnormality are seen. IMPRESSION: Distal tip of feeding tube seen in left side of the abdomen most consistent with proximal small bowel loop in this patient with history of gastric bypass. Electronically Signed   By: Marijo Conception M.D.   On: 07/11/2020 10:56   Recent Labs    07/11/20 0637  WBC 8.4  HGB 10.9*  HCT 33.2*  PLT 869*   Recent Labs    07/11/20 0637  NA 135  K 4.4  CL 103  CO2 22  GLUCOSE 124*  BUN 32*  CREATININE 1.33*  CALCIUM 8.9    Intake/Output Summary (Last 24 hours) at 07/12/2020 1019 Last data filed at 07/12/2020 0750 Gross per 24 hour  Intake 158 ml  Output --  Net 158 ml        Physical Exam: BP 115/63   Pulse 72   Temp 98.3 F (36.8 C)   Resp 17   Ht 5\' 2"  (1.575 m)   Wt 89.6 kg   LMP 09/28/2012   SpO2 100%   BMI 36.13 kg/m   Gen: no distress, normal appearing HEENT: + NG. NCAT Cardio: Reg rate Chest: normal effort, normal rate of breathing Abd: soft, non-distended Ext: no edema Psych: pleasant, normal affect Skin: intact Neuro: Global aphasia Motor: Unable to follow commands, spontaneously  moving bilateral upper extremities    Assessment/Plan: 1. Functional deficits which require 3+ hours per day of interdisciplinary therapy in a comprehensive inpatient rehab setting.  Physiatrist is providing close team supervision and 24 hour management of active medical problems listed below.  Physiatrist and rehab team continue to assess barriers to discharge/monitor patient progress toward functional and medical goals   Care Tool:  Bathing    Body parts bathed by patient: Chest,Face,Front perineal area,Abdomen,Left arm,Right arm,Right upper leg,Left upper leg   Body parts bathed by helper: Buttocks,Right lower leg,Left lower leg     Bathing assist Assist Level: Minimal Assistance - Patient > 75%     Upper Body Dressing/Undressing Upper body dressing   What is the patient wearing?: Pull over shirt    Upper body assist Assist Level: Minimal Assistance - Patient > 75%    Lower Body Dressing/Undressing Lower body dressing      What is the patient wearing?: Incontinence brief,Pants     Lower body assist Assist for lower body dressing: Moderate Assistance - Patient 50 - 74%     Toileting Toileting    Toileting assist Assist for toileting: Minimal Assistance - Patient > 75%     Transfers Chair/bed transfer  Transfers assist     Chair/bed transfer assist level: Contact Guard/Touching assist     Locomotion Ambulation   Ambulation assist  Assist level: Minimal Assistance - Patient > 75% Assistive device: Walker-rolling Max distance: 40 ft   Walk 10 feet activity   Assist     Assist level: Minimal Assistance - Patient > 75% Assistive device: Walker-rolling   Walk 50 feet activity   Assist Walk 50 feet with 2 turns activity did not occur: Safety/medical concerns         Walk 150 feet activity   Assist Walk 150 feet activity did not occur: Safety/medical concerns         Walk 10 feet on uneven surface  activity   Assist Walk  10 feet on uneven surfaces activity did not occur: Safety/medical concerns         Wheelchair     Assist Will patient use wheelchair at discharge?: No             Wheelchair 50 feet with 2 turns activity    Assist            Wheelchair 150 feet activity     Assist           Medical Problem List and Plan: 1.  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  Antithrombotics: -DVT/anticoagulation: Continue Lovenox             -antiplatelet therapy: Aspirin 81 mg daily and Brilinta 90 mg twice daily 3. Pain Management:  Reports headaches at times- continue Tylenol prn 4. Mood: Provide emotional support             -antipsychotic agents: N/A 5. Neuropsych: This patient is not capable of making decisions on her own behalf. 6. Skin/Wound Care: Routine skin checks 7. Fluids/Electrolytes/Nutrition: Routine and outs            CMP 2/7 reviewed and electrolytes are stable.  8.  Poststroke dysphagia:.  Dysphagia #2 nectar liquids. Dietary follow-up             Advance diet as tolerated  TF spread over longer time period to minimize nausea/emesis.  9.  Seizure prophylaxis.  Keppra 500 mg twice daily.  EEG negative.  Wean to 500mg  daily.  10.  Urinary retention: Urecholine 10 mg 3 times daily.  Check PVR 11.  Diet-controlled diabetes mellitus.  Hemoglobin A1c 5.4.  CBGs discontinued             Monitor with increased mobility 12.  Hyperlipidemia: Lipitor 13.  Morbid obesity status post gastric sleeve 05/24/2020.  Dietary follow-up 14.  CKD stage III.  Baseline creatinine 1.41-1.59             Cr 1.33 on 2/7  LOS: 3 days A FACE TO FACE EVALUATION WAS PERFORMED  [name] P Rodgerick Gilliand 07/12/2020, 10:19 AM

## 2020-07-12 NOTE — Progress Notes (Signed)
Speech Language Pathology Daily Session Note  Patient Details  Name: Courtney Burke MRN: [id number] Date of Birth: 10-18-1955  Today's Date: 07/12/2020 SLP Individual Time: 6967-8938 SLP Individual Time Calculation (min): 40 min  Short Term Goals: Week 1: SLP Short Term Goal 1 (Week 1): Pt will vocalize on command in 50% of opportunities with mod assist multimodal cues. SLP Short Term Goal 2 (Week 1): Pt will replicate non speech oral movements in 25% of opportunities with max assist multimodal cues. SLP Short Term Goal 3 (Week 1): Pt will match picture to word from a field of two for 50% accuracy with max assist multimodal cues. SLP Short Term Goal 4 (Week 1): Pt will consume dys 2 textures and nectar thick liquids with min assist cues for use of swallowing precautions and minimal overt s/s of aspiration. SLP Short Term Goal 5 (Week 1): Pt will consume therapeutic trials of thin liquids with min assist cues for use of swallowing precautions and minimal overt s/s of aspiration.  Skilled Therapeutic Interventions: Pt was seen for skilled ST targeting dysphagia and expressive/receptive language skills. SLP provided set up assist and Min A verbal cues for initiation and thoroughness for pt to complete oral care via suction toothbrush. She then accepted trial of ~5oz thin H2O per water protocol guidelines; Mod A verbal and intermittently tactile assist to remove cup from lips required for pt to follow directions for small sips and slow rate of drinking. One prolonged coughing episode noted when pt was unable to follow cues, taking large consecutive cup sips of thin. Intermittently, multiple swallows were also observed throughout intake. Recommend continue current diet. SLP further facilitated session with Mod A multimodal cues to match written words to objects from field of 2 with 40% accuracy today. Pt required Max A cueing to comprehend SLP's request not to try to verbalize during this task, to maximize  receptive skills (by eliminating demand of verbalizing). Clear receptive deficits are evident, although expressive deficits still remain more severe. Most of pt's verbalizations throughout session were neologisms. She did correctly verbalize "yeah" and her name [name] with Max A written and verbal models. Pt left laying in bed with alarm set and needs within reach. Continue per current plan of care.          Pain Pain Assessment Pain Scale: Faces Faces Pain Scale: No hurt  Therapy/Group: Individual Therapy  Arbutus Leas 07/12/2020, 7:23 AM

## 2020-07-12 NOTE — Progress Notes (Signed)
Occupational Therapy Session Note  Patient Details  Name: Courtney Burke MRN: [id number] Date of Birth: 1956/04/15  Today's Date: 07/12/2020 OT Individual Time: 1345-1415 OT Individual Time Calculation (min): 30 min    Short Term Goals: Week 1:  OT Short Term Goal 1 (Week 1): Pt will complete toileting with Min A OT Short Term Goal 2 (Week 1): Pt will don overhead shirt with CGA OT Short Term Goal 3 (Week 1): Pt will complete oral care with Min A and no more than min cues for sequencing  Skilled Therapeutic Interventions/Progress Updates:    Patient in bed, alert, she is unable to communicate wants and needs but yes/no appear appropriate, she follows basic directions.  she declines need for toileting, able to donn pants with min A, CS to donn shoes.   She does not appear to be in any distress.  Supine to sitting edge of bed with min A.  Sit to stand and short distance ambulation to/from bed, w/c and mat table with CGA/min A.  Completed seated and standing coordination and balance activities with CGA/min A - cues to use right arm (improves with repetition)   Ambulation with RW w/c to bed at close of session (approx 10 feet) with CGA.  Returned to bed with min A.  She remained in bed at close of session, bed alarm set and call bell in hand.  She is able to say "thank you" and indicates that she is feeling okay.    Therapy Documentation Precautions:  Precautions Precautions: Fall Precaution Comments: globally aphasic, NG tube Restrictions Weight Bearing Restrictions: No   Therapy/Group: Individual Therapy  Carlos Levering 07/12/2020, 7:49 AM

## 2020-07-12 NOTE — Progress Notes (Signed)
Occupational Therapy Session Note  Patient Details  Name: Sharlisa Hollifield MRN: [id number] Date of Birth: 1955-07-25  Today's Date: 07/12/2020 OT Individual Time: 1100-1203 OT Individual Time Calculation (min): 63 min    Short Term Goals: Week 1:  OT Short Term Goal 1 (Week 1): Pt will complete toileting with Min A OT Short Term Goal 2 (Week 1): Pt will don overhead shirt with CGA OT Short Term Goal 3 (Week 1): Pt will complete oral care with Min A and no more than min cues for sequencing  Skilled Therapeutic Interventions/Progress Updates:    Pt received semi-reclined with RN present, agreeable to therapy. Session focus on seated grooming, dressing, and toileting. Pt completes supine > sitting EOB with CGA for balance. Donned B socks with CGA for balance. Verbalizes when asked if in pain, unable to indicate where 2/2 expressive/receptive language deficits, RN aware and administering rx during session. Stand-pivot bed<>wc<>toilet throughout session with CGA. Seated in w/c at sink, brushed hair with teeth facing outwards, req A to correct 2/2 decreased error awareness. Washed face with set-up A. Doffed/donned shirt with min A to initiate. Doffed pants with min A to remove from BLE. Donned brief and pants with mod A to thread BLE. Pt becoming emotional labile, pointing towards bathroom, attempting to walk with pants still around ankles. Verbalized "I've been trying to tell you" when asked if she needed to go to the bathroom. Cont void of b/b. Min A for clothing management and min A for thoroughness of peri-care, pt able to initiate peri-care but attempted to wipe hands with used tissue. Sitting EOB > supine with min A to readjust BLE in bed. Pt left semi-reclined in bed with bed alarm engaged, call bell in reach, and all immediate needs met.    Therapy Documentation Precautions:  Precautions Precautions: Fall Precaution Comments: globally aphasic, NG tube Restrictions Weight Bearing Restrictions:  No Pain: Pain Assessment Pain Scale: Faces Faces Pain Scale: Hurts little more Pain Type: Acute pain Pain Location: Head Pain Descriptors / Indicators: Headache Pain Onset: Unable to tell Pain Intervention(s): Medication (See eMAR) ADL: See Care Tool for more details.   Therapy/Group: Individual Therapy  Volanda Napoleon MS, OTR/L  07/12/2020, 12:11 PM

## 2020-07-12 NOTE — IPOC Note (Signed)
Overall Plan of Care [facility]) Patient Details Name: Courtney Burke MRN: [id number] DOB: 1955/07/21  Admitting Diagnosis: <principal problem not specified>  Hospital Problems: Active Problems:   Left middle cerebral artery stroke [facility])     Functional Problem List: Nursing Bowel,Bladder,Safety,Perception,Motor,Endurance,Nutrition,Medication Management  PT Balance,Endurance,Motor,Safety,Perception  OT Balance,Perception,Safety,Cognition,Endurance,Motor,Nutrition  SLP Linguistic,Nutrition  TR         Basic ADL's: OT Eating,Grooming,Bathing,Dressing,Toileting     Advanced  ADL's: OT Simple Meal Preparation     Transfers: PT Bed Mobility,Bed to Chair,Car  OT Toilet,Tub/Shower     Locomotion: PT Ambulation,Stairs     Additional Impairments: OT Fuctional Use of Upper Extremity  SLP Communication,Swallowing comprehension,expression    TR      Anticipated Outcomes Item Anticipated Outcome  Self Feeding Supervision  Swallowing  supervision   Basic self-care  Supervision  Toileting  Supervision   Bathroom Transfers Supervision  Bowel/Bladder  Pt will regain bowel/bladder continence during hospital stay.  Transfers  S transfers  Locomotion  S gait, stairs  Communication  mod assist  Cognition     Pain  Pt will maintain low level of pain during hospital stay.  Safety/Judgment  Pt will regain safety/judgement awareness during hospital stay.   Therapy Plan: PT Intensity: Minimum of 1-2 x/day ,45 to 90 minutes PT Frequency: 5 out of 7 days PT Duration Estimated Length of Stay: 14 to 16 days OT Intensity: Minimum of 1-2 x/day, 45 to 90 minutes OT Frequency: 5 out of 7 days OT Duration/Estimated Length of Stay: 12-14 days SLP Intensity: Minumum of 1-2 x/day, 30 to 90 minutes SLP Frequency: 3 to 5 out of 7 days SLP Duration/Estimated Length of Stay: 14-16 days   Due to the current state of emergency, patients may not be receiving their 3-hours of  Medicare-mandated therapy.   Team Interventions: Nursing Interventions Patient/Family Education,Cognitive Remediation/Compensation,Bowel Management,Psychosocial Support,Discharge Planning,Medication Environmental consultant  PT interventions Ambulation/gait training,Balance/vestibular training,Discharge planning,Neuromuscular re-education,Functional mobility training,Patient/family education,Stair training,Therapeutic Activities,Therapeutic Exercise,UE/LE Strength taining/ROM,UE/LE Coordination activities,Visual/perceptual remediation/compensation  OT Interventions Balance/vestibular training,Discharge planning,Pain management,Self Care/advanced ADL retraining,Therapeutic Activities,UE/LE Coordination activities,Therapeutic Exercise,Patient/family education,Functional mobility training,Disease mangement/prevention,Cognitive remediation/compensation,Community reintegration,DME/adaptive equipment instruction,Neuromuscular re-education,Psychosocial support,UE/LE Strength taining/ROM  SLP Interventions Cueing hierarchy,Functional tasks,Environmental controls,Dysphagia/aspiration precaution training,Internal/external aids,Speech/Language facilitation,Multimodal communication approach,Patient/family education  TR Interventions    SW/CM Interventions Discharge Planning,Psychosocial Support,Patient/Family Education,Disease Management/Prevention   Barriers to Discharge MD  Medical stability  Nursing Nutrition means,Medication compliance,Other (comments) expressive aphasia  PT Decreased caregiver support    OT Other (comments) Need to figure out d/c location + caregiver support  SLP      SW       Team Discharge Planning: Destination: PT- (TBD) ,OT- Home (?) , SLP-Home Projected Follow-up: PT- (TBD), OT-  Home health OT,24 hour supervision/assistance, SLP-Home Health SLP,24 hour supervision/assistance Projected Equipment Needs: PT- , OT- To be determined, SLP-To be  determined Equipment Details: PT- , OT-  Patient/family involved in discharge planning: PT- Patient,  OT-Patient unable/family or caregiver not available, SLP-Patient  MD ELOS:  Medical Rehab Prognosis:  Excellent Assessment: Courtney Burke is a 65 year old woman who is admitted to CIR with a eft MCA stroke resulting in global aphasia and dysphagia. Creatinine is being monitored given stage III CKD. Keppra is being weaned given negative EEG. TF have been adjusted given nausea and vomiting.    See Team Conference Notes for weekly updates to the plan of care

## 2020-07-13 ENCOUNTER — Inpatient Hospital Stay (HOSPITAL_COMMUNITY): Payer: Medicare Other

## 2020-07-13 LAB — GLUCOSE, CAPILLARY
Glucose-Capillary: 115 mg/dL — ABNORMAL HIGH (ref 70–99)
Glucose-Capillary: 120 mg/dL — ABNORMAL HIGH (ref 70–99)
Glucose-Capillary: 121 mg/dL — ABNORMAL HIGH (ref 70–99)
Glucose-Capillary: 132 mg/dL — ABNORMAL HIGH (ref 70–99)
Glucose-Capillary: 139 mg/dL — ABNORMAL HIGH (ref 70–99)
Glucose-Capillary: 167 mg/dL — ABNORMAL HIGH (ref 70–99)

## 2020-07-13 IMAGING — DX DG ABDOMEN 1V
2 series · 2 of 2 positions shown · non-contrast
Comparison: [DATE] and prior.

CLINICAL DATA: Abdominal pain

EXAM:
ABDOMEN - 1 VIEW

[t abdomen supine (1 of 2)]
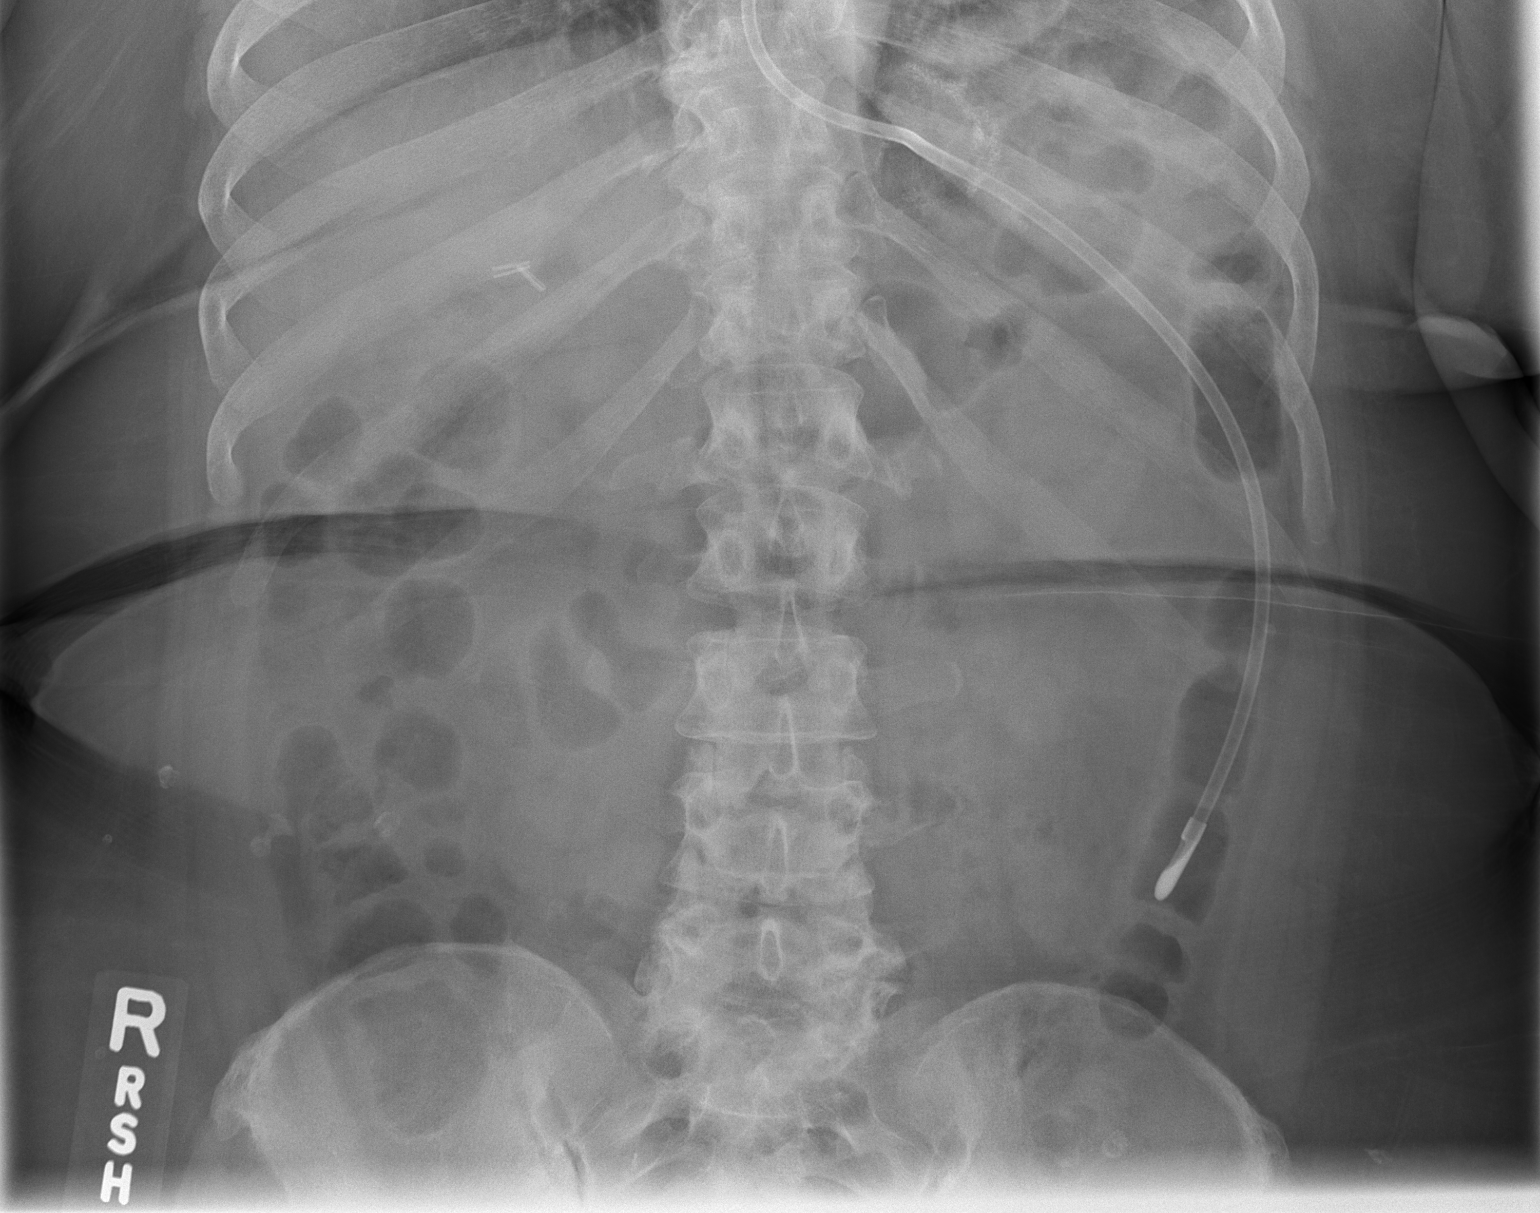

[t abdomen supine (2 of 2)]
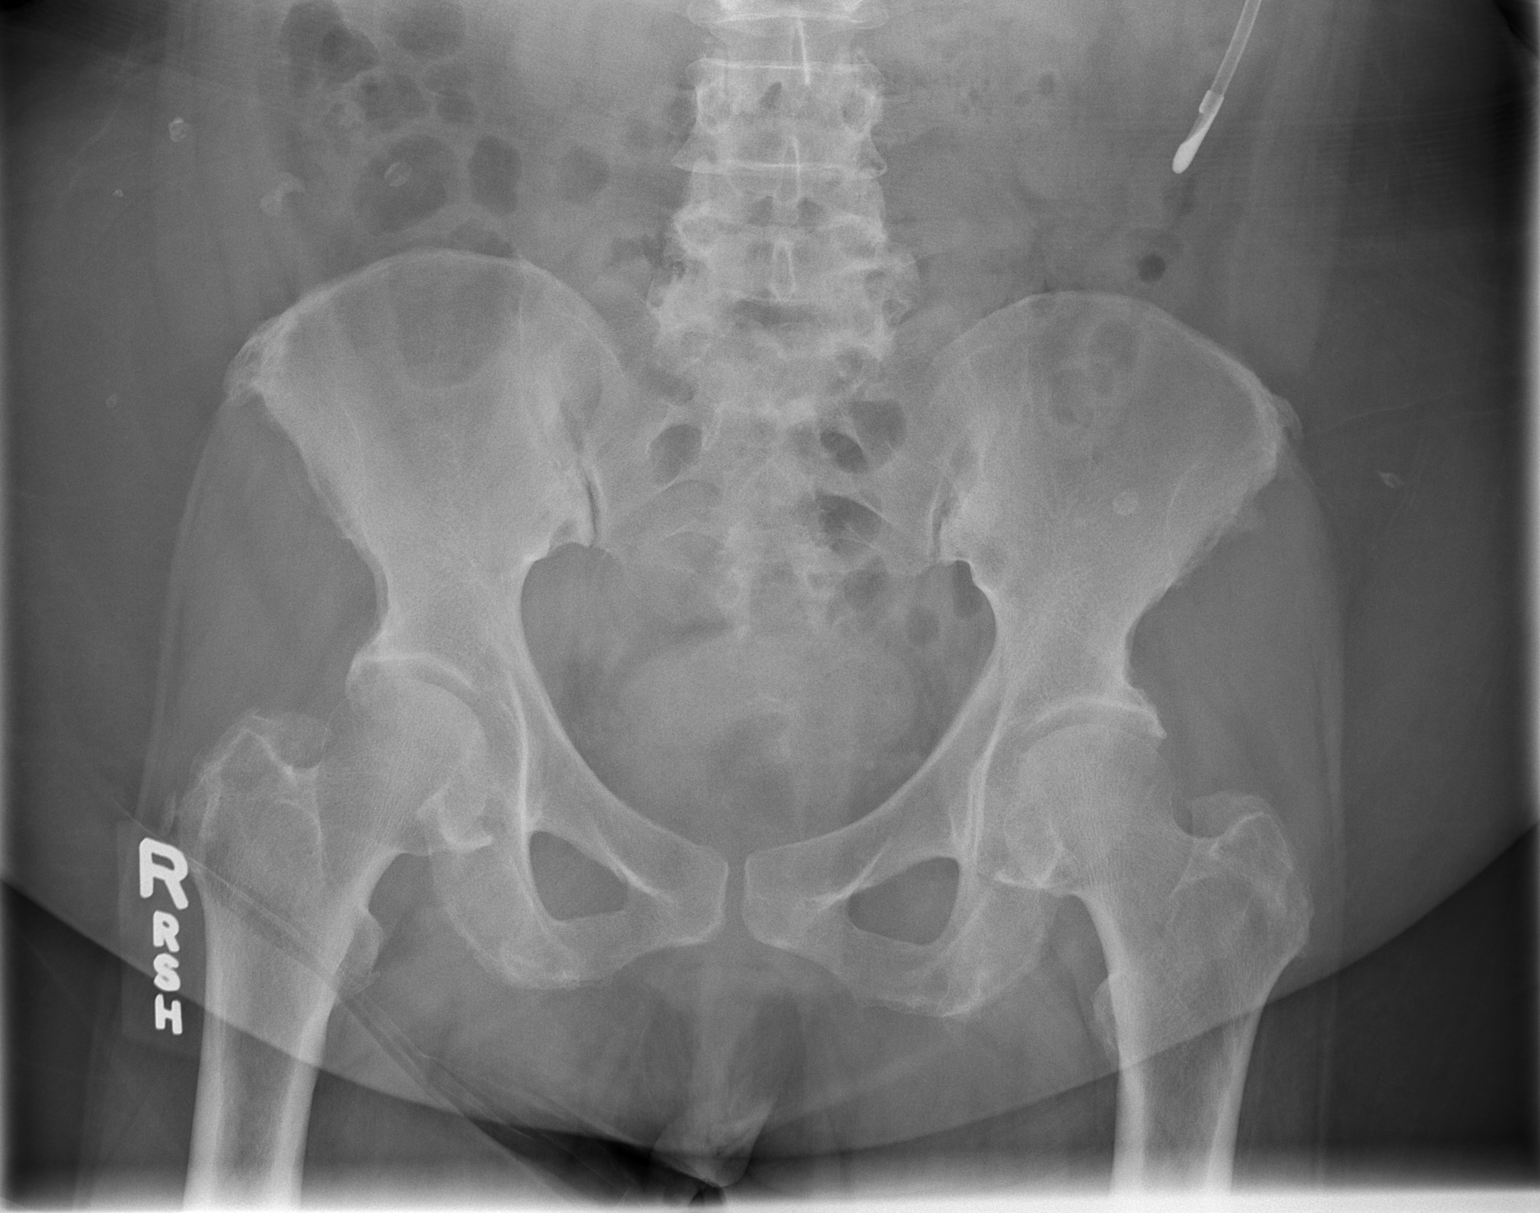

[2 of 2 positions shown; findings below may reference images not displayed]

FINDINGS: Normal bowel gas pattern. Upper abdominal suture material and
cholecystectomy clips. Enteric tube tip overlying the left
hemiabdomen is unchanged in positioning.
IMPRESSION: Unchanged positioning of enteric tube.

Nonobstructive bowel gas pattern.

## 2020-07-13 MED ORDER — OSMOLITE 1.2 CAL PO LIQD
720.0000 mL | ORAL | Status: DC
  Administered 2020-07-13 – 2020-07-15 (×3): 720 mL
  Filled 2020-07-13: qty 948

## 2020-07-13 MED ORDER — ONDANSETRON HCL 4 MG PO TABS
4.0000 mg | ORAL_TABLET | Freq: Three times a day (TID) | ORAL | Status: DC | PRN
  Administered 2020-07-19: 4 mg
  Filled 2020-07-13: qty 1

## 2020-07-13 MED ORDER — FREE WATER
100.0000 mL | Status: DC
  Administered 2020-07-13 – 2020-07-16 (×17): 100 mL

## 2020-07-13 NOTE — Progress Notes (Signed)
Patient returned to unit from radiology.  Resting comfortably with eyes closed.

## 2020-07-13 NOTE — Progress Notes (Signed)
Speech Language Pathology Daily Session Note  Patient Details  Name: Faline Langer MRN: [id number] Date of Birth: [birth year]  Today's Date: 07/13/2020 SLP Individual Time: 1210-1234 SLP Individual Time Calculation (min): 24 min  Short Term Goals: Week 1: SLP Short Term Goal 1 (Week 1): Pt will vocalize on command in 50% of opportunities with mod assist multimodal cues. SLP Short Term Goal 2 (Week 1): Pt will replicate non speech oral movements in 25% of opportunities with max assist multimodal cues. SLP Short Term Goal 3 (Week 1): Pt will match picture to word from a field of two for 50% accuracy with max assist multimodal cues. SLP Short Term Goal 4 (Week 1): Pt will consume dys 2 textures and nectar thick liquids with min assist cues for use of swallowing precautions and minimal overt s/s of aspiration. SLP Short Term Goal 5 (Week 1): Pt will consume therapeutic trials of thin liquids with min assist cues for use of swallowing precautions and minimal overt s/s of aspiration.  Skilled Therapeutic Interventions: #1 Skilled ST services focused on language skills. Pt was asleep upon entering however was able to open eyes and nodded head "yes" to participate in [place] services. SLP repositioned pt in bed, provided cold compress and chest rub to gain alertness, however it was only fleeting to 5 seconds. Pt's nurse also attempted to arouse pt with no successful, but reported she had just returned for PT services. Pt missed 20 minutes of treatment time and SLP will attempt to make up time later in the day. Pt was left in room with call bell within reach and bed alarm set. SLP recommends to continue skilled services.     #2 Skilled ST services focused on language, speech and swallow skills. Pt was awake and agreeable to participate in ST services. Pt demonstrated ability to match object to word in a field of 2 in 5 out 10 opportunities and in 7 out 10 opportunities with min A cues for function of object. SLP  provided oral care piror to trials of thin liquids via cup. Pt demonstrated delayed throat clear when consuming initial concessive sips, however when cues to consume small sips no further overt s/s aspiration noted. Pt consumed 4oz. Pt spontaneously verbalized approximation of "thank you" x4 as SLP was leaving! Pt was left in room with call bell within reach and bed alarm set. SLP recommends to continue skilled services.     Pain Pain Assessment Pain Scale: Faces Pain Score: 0-No pain Faces Pain Scale: No hurt  Therapy/Group: Individual Therapy  Bethania Schlotzhauer  [facility] 07/13/2020, 12:48 PM

## 2020-07-13 NOTE — Progress Notes (Signed)
Occupational Therapy Session Note  Patient Details  Name: Courtney Burke MRN: [id number] Date of Birth: 06-30-1955  Today's Date: 07/13/2020 OT Individual Time: 1136-1200 OT Individual Time Calculation (min): 24 min  and 23 min and 17 min   Short Term Goals: Week 1:  OT Short Term Goal 1 (Week 1): Pt will complete toileting with Min A OT Short Term Goal 2 (Week 1): Pt will don overhead shirt with CGA OT Short Term Goal 3 (Week 1): Pt will complete oral care with Min A and no more than min cues for sequencing  Skilled Therapeutic Interventions/Progress Updates:     Session 1 (6834-1962): Pt received supine in bed, agreeable to therapy. No s/sx of pain throughout session. Supine > sitting EOB with close S + use of bed rail. STS and stand-pivot to w/c with CGA for balance. Donned shoes with close S. Toilet transfer with stand-pivot with CGA + RW. Cont void of b/b. Min A for LB clothing management to pull pants down/over B hips, min A for posterior peri care for thoroughness/balance. Seated at sink, washed hands and donned wig with set-up A. Amb ~4 ft to bed with RW + CGA. Doffed shoes and sitting EOB > supine with close S. Pt left in bed with HOB at 30 degrees with bed alarm engaged, call bell in reach, and all immediate needs met.    Session 2 (1447-1510): Pt received semi-reclined in bed, agreeable to therapy. No s/sx of pain throughout session. Supine > sitting EOB with CGA. Donned shoes with close S. Amb to chair with RW + CGA. Toilet transfer via [place]. LB clothing management + anterior peri care with min A for balance + use of L grab bar. Stood to wash hands and comb hair at sink with CGA for balance. Transported to gym in w/c 2/2 time management. Stand-pivot to mat same manner as above. Stood to [employer] cards with CGA for balance using RUE for improved [place].  Pt hand off to PT in gym.  Session 3 [phone]): Pt received semi-reclined in bed with RN present, agreeable to make up missed  minutes from earlier pm session. Session focus on RUE NMR in prep for improved functional strength/FMC and ADL performance. With RUE, placed and removed level 1 - 4 resistive clothes pin on horizontal bar with min A to achieve 3 jaw chuck grasp. Completed 1x10 of the following with 1lb dowel rod: chest press, B shoulder flexion, and B shoulder flexion hold for 1 min before fatiguing. Declining further therapy. Pt left with HOB at 30 degrees, call bell in reach, bed alarm engaged, and all immediate needs met.   Therapy Documentation Precautions:  Precautions Precautions: Fall Precaution Comments: globally aphasic, NG tube Restrictions Weight Bearing Restrictions: No Pain: Pain Assessment Pain Scale: Faces Pain Score: 0-No pain Faces Pain Scale: No hurt ADL: See Care Tool for more details.   Therapy/Group: Individual Therapy  Volanda Napoleon MS, OTR/L  07/13/2020, 11:57 AM

## 2020-07-13 NOTE — Progress Notes (Signed)
Physical Therapy Session Note  Patient Details  Name: Courtney Burke MRN: [id number] Date of Birth: July 19, 1955  Today's Date: 07/13/2020 PT Individual Time:1505-1530   25 min   Short Term Goals: Week 1:  PT Short Term Goal 1 (Week 1): Pt will increase bed mobility to S. PT Short Term Goal 2 (Week 1): Pt will increase transfers to c/g. PT Short Term Goal 3 (Week 1): Pt will increase ambulation with LRAD about 100 feet with c/g PT Short Term Goal 4 (Week 1): Pt will ascend/descend 4 stairs with B rails and c/g..  Skilled Therapeutic Interventions/Progress Updates:   Pt received sitting EOB upon completion of OT, and agreeable to PT. PT instructed pt in gait training with RW x 28f, 362f and 1549fith min assist for AD management and improved visual scanning to the R for safety.   Dynamic balance training and visual scanning training instructed by PT to obtain bean bags from the R and toss to target 8 x 2 with moderate cues for visual scanning and min assist from PT to prevent lateral LOB.  Pt returned to room and performed stand pivot transfer to bed with min assist. Sit>supine completed with supervision assist, and left supine in bed with call bell in reach and all needs met.        Therapy Documentation Precautions:  Precautions Precautions: Fall Precaution Comments: globally aphasic, NG tube Restrictions Weight Bearing Restrictions: No    Vital Signs: Therapy Vitals Temp: 98.1 F (36.7 C) Temp Source: Oral Pulse Rate: 65 Resp: 16 BP: 119/79 Patient Position (if appropriate): Lying Oxygen Therapy SpO2: 100 % O2 Device: Room Air Pain: Pain Assessment Pain Scale: Faces Pain Score: 0-No pain Faces Pain Scale: No hurt    Therapy/Group: Individual Therapy  AusLorie Phenix9/2022, 3:04 PM

## 2020-07-13 NOTE — Progress Notes (Signed)
Brief Nutrition Note  RD consulted for tube feeding initiation and management with comment of "nausea and emesis with TF." Spoke with RN who reports pt has not had emesis today but has experienced nausea. Per RN, pt "eating well" (ate 40% of breakfast) and is full from getting both tube feeds and eating at mealtimes. Explained that pt has a history of gastric bypass surgery which is likely contributing to early satiety. Pt also with post-pyloric Cortrak in place. Tube placement confirmed with KUB today.  Discussed pt with MD. Discussed that nausea likely related more to PO intake rather than TF since tip of Cortrak is post-pyloric. MD would like to try extending time that pt is on tube feeds from 16 hours to 20 hours so that rate can be decreased.  Discussed plan with RN. RD will also decrease free water flushes as 200 ml q 4 hours is likely contributing to feelings of fullness.  Pt will continue with current oral nutrition supplements and vitamin/mineral supplementation.  Plan is to resume tube feeding at lower rate over extended period of time: - Osmolite 1.2 @ 45 ml/hr to run over 20 hours (tube feeds can be held for up to 4 hour for therapies) for a total of 900 ml of formula - ProSource TF 45 ml BID - Free water flushes of 100 ml q 4 hours  Tube feeding regimen provides 1160 kcal, 72 grams of protein, and 738 ml of H2O (meets 73% of kcal needs and 80% of protein needs).  Total free water with flushes: 1338 ml   Gustavus Bryant, MS, RD, LDN Inpatient Clinical Dietitian Please see [facility] for contact information.

## 2020-07-13 NOTE — Progress Notes (Signed)
Patient ID: Courtney Burke, female   DOB: 28-Jul-1955, 65 y.o.   MRN: [id number] Team Conference Report to Patient/Family  Team Conference discussion was reviewed with the patient and caregiver, including goals, any changes in plan of care and target discharge date.  Patient and caregiver express understanding and are in agreement.  The patient has a target discharge date of 07/25/20.  Dyanne Iha 07/13/2020, 12:55 PM

## 2020-07-13 NOTE — Progress Notes (Signed)
Transport here to transport patient to radiology for a KUB, via stretcher.

## 2020-07-13 NOTE — Progress Notes (Signed)
[facility] PHYSICAL MEDICINE & REHABILITATION PROGRESS NOTE  Subjective/Complaints: Having nausea, emesis and abdominal pain with TF. KUB ordered, zofran ordered, TF stopped, and RD consult requested BP is soft D/c Keppra today  ROS: Unable to assess due to aphasia  Objective: Vital Signs: Blood pressure (!) 116/54, pulse 62, temperature 97.8 F (36.6 C), temperature source Oral, resp. rate 18, height 5\' 2"  (1.575 m), weight 90.6 kg, last menstrual period 09/28/2012, SpO2 99 %. No results found. Recent Labs    07/11/20 0637  WBC 8.4  HGB 10.9*  HCT 33.2*  PLT 869*   Recent Labs    07/11/20 0637  NA 135  K 4.4  CL 103  CO2 22  GLUCOSE 124*  BUN 32*  CREATININE 1.33*  CALCIUM 8.9    Intake/Output Summary (Last 24 hours) at 07/13/2020 1136 Last data filed at 07/13/2020 0900 Gross per 24 hour  Intake 756 ml  Output --  Net 756 ml        Physical Exam: BP (!) 116/54 (BP Location: Left Arm)   Pulse 62   Temp 97.8 F (36.6 C) (Oral)   Resp 18   Ht 5\' 2"  (1.575 m)   Wt 90.6 kg   LMP 09/28/2012   SpO2 99%   BMI 36.53 kg/m   Gen: no distress, normal appearing HEENT: oral mucosa pink and moist, + NG.  Cardio: Reg rate Chest: normal effort, normal rate of breathing Abd: soft, non-distended Ext: no edema Psych: pleasant, normal affect Skin: intact Neuro: Global aphasia Motor: Unable to follow commands, spontaneously moving bilateral upper extremities    Assessment/Plan: 1. Functional deficits which require 3+ hours per day of interdisciplinary therapy in a comprehensive inpatient rehab setting.  Physiatrist is providing close team supervision and 24 hour management of active medical problems listed below.  Physiatrist and rehab team continue to assess barriers to discharge/monitor patient progress toward functional and medical goals   Care Tool:  Bathing    Body parts bathed by patient: Chest,Face,Front perineal area,Abdomen,Left arm,Right arm,Right  upper leg,Left upper leg   Body parts bathed by helper: Buttocks,Right lower leg,Left lower leg     Bathing assist Assist Level: Minimal Assistance - Patient > 75%     Upper Body Dressing/Undressing Upper body dressing   What is the patient wearing?: Pull over shirt    Upper body assist Assist Level: Minimal Assistance - Patient > 75%    Lower Body Dressing/Undressing Lower body dressing      What is the patient wearing?: Pants     Lower body assist Assist for lower body dressing: Minimal Assistance - Patient > 75%     Toileting Toileting    Toileting assist Assist for toileting: Minimal Assistance - Patient > 75%     Transfers Chair/bed transfer  Transfers assist     Chair/bed transfer assist level: Contact Guard/Touching assist     Locomotion Ambulation   Ambulation assist      Assist level: Contact Guard/Touching assist Assistive device: Walker-rolling Max distance: 65 ft   Walk 10 feet activity   Assist     Assist level: Contact Guard/Touching assist Assistive device: Walker-rolling   Walk 50 feet activity   Assist Walk 50 feet with 2 turns activity did not occur: Safety/medical concerns  Assist level: Contact Guard/Touching assist Assistive device: Walker-rolling    Walk 150 feet activity   Assist Walk 150 feet activity did not occur: Safety/medical concerns         Walk 10  feet on uneven surface  activity   Assist Walk 10 feet on uneven surfaces activity did not occur: Safety/medical concerns         Wheelchair     Assist Will patient use wheelchair at discharge?: No             Wheelchair 50 feet with 2 turns activity    Assist            Wheelchair 150 feet activity     Assist           Medical Problem List and Plan: 1.  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  Antithrombotics: -DVT/anticoagulation: Continue Lovenox             -antiplatelet therapy: Aspirin 81 mg daily and Brilinta 90 mg twice daily 3. Pain Management:  Reports headaches at times- continue Tylenol prn 4. Mood: Provide emotional support             -antipsychotic agents: N/A 5. Neuropsych: This patient is not capable of making decisions on her own behalf. 6. Skin/Wound Care: Routine skin checks 7. Fluids/Electrolytes/Nutrition: Routine and outs            CMP 2/7 reviewed and electrolytes are stable.  8.  Poststroke dysphagia:.  Dysphagia #2 nectar liquids. Dietary follow-up             Advance diet as tolerated  TF spread over longer time period to minimize nausea/emesis.  9.  Seizure prophylaxis.  Keppra 500 mg twice daily.  EEG negative. D/c Keppra.  10.  Urinary retention: Continue Urecholine 10 mg 3 times daily.  Check PVR 11.  Diet-controlled diabetes mellitus.  Hemoglobin A1c 5.4.  CBGs discontinued             Monitor with increased mobility 12.  Hyperlipidemia: Lipitor 13.  Morbid obesity status post gastric sleeve 05/24/2020.  Dietary follow-up 14.  CKD stage III.  Baseline creatinine 1.41-1.59             Cr 1.33 on 2/7 15. Abdominal pain, nausea, emesis: stop TF, repeat KUB, zofran ordered, RD consult placed.   LOS: 4 days A FACE TO FACE EVALUATION WAS PERFORMED  Courtney Burke 07/13/2020, 11:36 AM

## 2020-07-13 NOTE — Progress Notes (Signed)
Speech Language Pathology Daily Session Note  Patient Details  Name: Courtney Burke MRN: [id number] Date of Birth: [birth year]  Today's Date: 07/13/2020 SLP Individual Time: 1258-1340 SLP Individual Time Calculation (min): 42 min  Short Term Goals: Week 1: SLP Short Term Goal 1 (Week 1): Pt will vocalize on command in 50% of opportunities with mod assist multimodal cues. SLP Short Term Goal 2 (Week 1): Pt will replicate non speech oral movements in 25% of opportunities with max assist multimodal cues. SLP Short Term Goal 3 (Week 1): Pt will match picture to word from a field of two for 50% accuracy with max assist multimodal cues. SLP Short Term Goal 4 (Week 1): Pt will consume dys 2 textures and nectar thick liquids with min assist cues for use of swallowing precautions and minimal overt s/s of aspiration. SLP Short Term Goal 5 (Week 1): Pt will consume therapeutic trials of thin liquids with min assist cues for use of swallowing precautions and minimal overt s/s of aspiration.  Skilled Therapeutic Interventions: Pt was seen for skilled ST targeting dysphagia and communication goals. SLP facilitated session with Moderate assistance with self feeding and Min A verbal cues for use of compensatory safe swallowing strategies during consumption of current diet dysphagia 2/nectar thick liquid lunch tray. She demonstrated extended mastication time of dysphagia 2 meats, but full oral clearance achieved with sips of nectar. She took in very little total PO from her lunch and declined opportunity to trial advanced Dys 3 solid snack with SLP. Recommend continue current diet for now. During writing activity, pt ultimately required trace cues to write her first and last name with 95% accuracy. She also spontaneously demonstrated strong desire to communicate a message to SLP but verbalizations were 100% neologisms. However, she approximated the word "house" in writing on her whiteboard. When asked if she was  inquiring about when she could go home, pt nodded her head yes. She appeared to comprehend SLP's description of d/c date and number of days reaming in CIR, based on that date. Pt did demonstrate ability to follow verbal articulatory placement cues to imitate vowel sounds X2 each, including "ee", "ah", "oo", and "oh". Pt left laying in bed with alarm set and needs within reach. Continue per current plan of care.        Pain Pain Assessment Pain Scale: 0-10 Pain Score: 0-No pain Faces Pain Scale: No hurt  Therapy/Group: Individual Therapy  Arbutus Leas 07/13/2020, 7:23 AM

## 2020-07-13 NOTE — Progress Notes (Signed)
Physical Therapy Session Note  Patient Details  Name: Courtney Burke MRN: [id number] Date of Birth: [birth year]  Today's Date: 07/13/2020 PT Individual Time: 1751-0258 PT Individual Time Calculation (min): 56 min   Short Term Goals: Week 1:  PT Short Term Goal 1 (Week 1): Pt will increase bed mobility to S. PT Short Term Goal 2 (Week 1): Pt will increase transfers to c/g. PT Short Term Goal 3 (Week 1): Pt will increase ambulation with LRAD about 100 feet with c/g PT Short Term Goal 4 (Week 1): Pt will ascend/descend 4 stairs with B rails and c/g..  Skilled Therapeutic Interventions/Progress Updates:    Patient supine in bed upon PT arrival. Patient alert and agreeable to PT session although emotionally labile for brief period. This PT is able to provide some comfort with calming voice and assurance that pt is okay. Patient denied true pain throughout session.  Therapeutic Activity: Pt requires donning of TED hose per MD orders. New stockings retrieved, donned with Max A while RN provided last bolus of morning meds through ng tube. Pt indicates irritation at back of throat and abdominal upset to RN.  Bed Mobility: Patient performed supine to/from sit with supervision and extra time to complete. Provided verbal cues for pushing with BUE against bed surface to complete rise to sit on EOB. Pt initially indicating that she is not feeling well. And cues given to pt to rest and use arms to assist with all bed mobility to prevent increase in intraabdominal pressure and prevent n/v.  Seated therapeutic rest prior to toileting to allow for abdominal symptoms to reduce. Transfers: Patient performed STS and SPVT transfers throughout with CGA/ SBA with good hand positioning/ placement in rise to stand. Provided verbal cues for reaching back to armrests prior to descending to sit. Toilet transfer performed with SBA and vc for RW mgmt, use of safety rail, and for pt to doff pants prior to sit. On rise to stand, pt  takes brief and attempts to donn herself in standing with good initiation and requiring Mod A to complete correctly to fasten. SBA for pt to don pants. No LOB noted.   Gait Training:  Patient ambulated 38' x1/ 15' x1/ 20' x1 feet using RW with Min A/ CGA for longest distance and CGA for shorter distances. Increased level of assist required for balance. Provided verbal cues throughout for increasing R step height of with pt unable to return demonstrate. Pt ends amb bout d/t fatigue. Reaches increased distance prior to fatigue as compared to yesterday's performance.   Patient transfers back to bed with NT at [facility] level, returns to supine at supervision level and is left with NT with  Bed alarm set, and all needs within reach.  Therapy Documentation Precautions:  Precautions Precautions: Fall Precaution Comments: globally aphasic, NG tube Restrictions Weight Bearing Restrictions: No  Therapy/Group: Individual Therapy  Alger Simons 07/13/2020, 4:02 PM

## 2020-07-13 NOTE — Patient Care Conference (Signed)
Inpatient RehabilitationTeam Conference and Plan of Care Update Date: 07/13/2020   Time: 10:01 AM    Patient Name: Courtney Burke      Medical Record Number: [id number]  Date of Birth: 04-Nov-1955 Sex: Female         Room/Bed: 4W06C/4W06C-01 Payor Info: Payor: [employer] MEDICARE / Plan: [facility] MEDICARE / Product Type: *No Product type* /    Admit Date/Time:  07/09/2020  2:20 PM  Primary Diagnosis:  Left middle cerebral artery stroke [facility])  Hospital Problems: Principal Problem:   Left middle cerebral artery stroke [facility])    Expected Discharge Date: Expected Discharge Date: 07/25/20  Team Members Present: Physician leading conference: Dr. Leeroy Cha Care Coodinator Present: Dorien Chihuahua, RN, BSN, CRRN;Christina Sampson Goon, [place] Nurse Present: Other (comment) Jarvis Morgan, RN) PT Present: Barrie Folk, PT OT Present: Other (comment) Providence Lanius, OT) SLP Present: Jettie Booze, CF-SLP PPS Coordinator present : Gunnar Fusi, SLP     Current Status/Progress Goal Weekly Team Focus  Bowel/Bladder   Pt is continent x2.  Pt will remain continent x2.  Assess q shift and prn.   Swallow/Nutrition/ Hydration   dys 2/nectar, water protocol, full supervision, Mod A  Min A  trials thin via water protocol, repeat MBS, D3 trials, carryover swallow strategies   ADL's   min A for UB ADLs, mod A for LB dressing, min A for toilet/shower transfer/toileting  Supervision  RUE NMR, functional transfers, dynamic standing/sitting balance, ADL retraining, pt/fam edu, AD/DME ed, praxis/func cog   Mobility   On eval was Min A overall, is now supervision for bed mobility, SBA/ CGA for transfers, and CGA for gait with RW. Dynamic standing balance is Min A. Globally aphasic with no awareness of aphasia  overall supervision/ SBA for mobility      Communication   Max-Total A, expresive deficits more severe than recpetive, but still impaired. Neologisms.  Mod A  matching words to objects, basic  comprehension/following directions, repeating functional words and phonemes, naming, auto speech tasks   Safety/Cognition/ Behavioral Observations            Pain   Pt does not report pain at this time.  Pain will remain <3.  Assess q shift and prn.   Skin   Skin is WNL.  Skin will remain WNL.  Assess q shift and prn.     Discharge Planning:  D/C home with dauhgter to provide care (works during the day patient friend/retired RN will stay with patient when dtr works)   Team Discussion: MD weaned Keppra. CKD and urinary retension treated. Patient with N/V; abd pain with tube feeds. MD to check abd. xray. Global aphasia although improved, limits communication and follow through. Expressive aphasia worse than receptive. Currently on D2-nectar diet with H2O protocol and requires cues for basic swallowing. Max assist- total assist for communication Patient on target to meet rehab goals: yes, Currently min assist for upper body and mod assist for lower body ADLs. With supervision goals set for discharge.  *See Care Plan and progress notes for long and short-term goals.   Revisions to Treatment Plan:   Teaching Needs: Cues, Safety, transfers, toileting, medications, secondary stroke risk management, etc.  Current Barriers to Discharge: Decreased caregiver support  Possible Resolutions to Barriers: Family education with daughter     Medical Summary Current Status: Abdominal pain, nasuea, and vomiting with TF, global aphasia, weaning Keppra, stage 3 CKD (Cr is trending downward)  Barriers to Discharge: Medical stability;Nutrition means  Barriers to Discharge  Comments: Abdominal pain, nasuea, and vomiting with TF, global aphasia Possible Resolutions to [employer] Focus: Obtain abdominal XR, stop tube feeds, Zofran for nausea, consult RD, continue to wean Keppra, continue to monitor Cr.   Continued Need for Acute Rehabilitation Level of Care: The patient requires daily medical  management by a physician with specialized training in physical medicine and rehabilitation for the following reasons: Direction of a multidisciplinary physical rehabilitation program to maximize functional independence : Yes Medical management of patient stability for increased activity during participation in an intensive rehabilitation regime.: Yes Analysis of laboratory values and/or radiology reports with any subsequent need for medication adjustment and/or medical intervention. : Yes   I attest that I was present, lead the team conference, and concur with the assessment and plan of the team.   Dorien Chihuahua B 07/13/2020, 12:54 PM

## 2020-07-14 LAB — GLUCOSE, CAPILLARY
Glucose-Capillary: 104 mg/dL — ABNORMAL HIGH (ref 70–99)
Glucose-Capillary: 115 mg/dL — ABNORMAL HIGH (ref 70–99)
Glucose-Capillary: 132 mg/dL — ABNORMAL HIGH (ref 70–99)
Glucose-Capillary: 136 mg/dL — ABNORMAL HIGH (ref 70–99)
Glucose-Capillary: 164 mg/dL — ABNORMAL HIGH (ref 70–99)
Glucose-Capillary: 92 mg/dL (ref 70–99)

## 2020-07-14 NOTE — Progress Notes (Signed)
Physical Therapy Session Note  Patient Details  Name: Courtney Burke MRN: [id number] Date of Birth: 1955/10/30  Today's Date: 07/14/2020 PT Individual Time: 2836-6294 and 1534-1630 PT Individual Time Calculation (min): 25 min and 56 min    Short Term Goals: Week 1:  PT Short Term Goal 1 (Week 1): Pt will increase bed mobility to S. PT Short Term Goal 2 (Week 1): Pt will increase transfers to c/g. PT Short Term Goal 3 (Week 1): Pt will increase ambulation with LRAD about 100 feet with c/g PT Short Term Goal 4 (Week 1): Pt will ascend/descend 4 stairs with B rails and c/g..  Skilled Therapeutic Interventions/Progress Updates:  Session 1  Pt received supine in bed and agreeable to PT. Supine>sit transfer with supervision assist and increased time.   Stand pivot transfers performed to and from [facility] x 2 throughout session with CGA to the L and min assist to R. Moderate cues for increased step length/width with the RLE. Dynamic balance training to perform reciprocal foot taps over cane on floor x 8 BLE and tap on 4inch step x 8 BLE. BUE support on RW throughout.   PT instructed pt in TUG: 80 sec (average of 2 trials; >13.5 sec indicates increased fall risk) UE support on RW and moderate cues for step length on the R and min assist in turn.   Pt returned to room and performed stand pivot transfer to bed with min assist as listed above. Sit>supine completed with supervision assist, and left supine in bed with call bell in reach and all needs met.     Session 2.  Pt received supine in bed and agreeable to PT. Supine>sit transfer with supervision assist and cues for use of rail. Once sitting EOB, pt attempting to communicate with nonsensical jargon, pointing to bathroom. When questioned about need for toileting pt nodding Yes.   Ambulatory transfer to toilet with min assist and RW with min cues for improved awareness of the RLE over threshold. Pt able to doff pants with min assist. Continent bowel  and bladder movements. See flowsheet. Pt perform pericare with set up assist from PT with wet rag. Hand hygiene with supervision assist and cues for use of soap and paper towel for drying.   Pt transported to rehab gym in [facility]. Stair management training with BUE support x4 steps with min assist. Cues for step to gait pattern, but pt leading alternately with BLE, no LOB noted.   Seated forced used of the R UE and reciprocal movement training x 8 minutes with constant cues and assist for attention to grasp on the RUE as well as improved ROM with BLE. Unable to rate RPE upon completion.   Gait training with RW and visual cues of reciprocal dots on floor for encouragement of reciprocal gait patern with emphasis on increased step length on the RLE. Pt able to attend to dots on floor 25% gait, but then noted to become distracted holding stomach in standing rest break. Unable to describe discomfort.   Pt returned to room and performed stand pivot transfer to bed with RW and min assist for safety and cues for increased step length R. Sit>supine completed with supervision assist and cues for LE position once on bed. Pt left supine in bed with call bell in reach and all needs met.           Therapy Documentation Precautions:  Precautions Precautions: Fall Precaution Comments: globally aphasic, NG tube Restrictions Weight Bearing Restrictions: No  Vital Signs: Therapy Vitals Temp: 97.9 F (36.6 C) Pulse Rate: 71 Resp: 16 BP: (!) 117/59 Patient Position (if appropriate): Lying Oxygen Therapy SpO2: 100 % O2 Device: Room Air Pain: Pain Assessment Pain Scale: 0-10 Pain Score: 0-No pain   Therapy/Group: Individual Therapy  Lorie Phenix 07/14/2020, 2:20 PM

## 2020-07-14 NOTE — Progress Notes (Signed)
[place] PHYSICAL MEDICINE & REHABILITATION PROGRESS NOTE  Subjective/Complaints: She is finishing up session with speech. Junie Panning reports that she expressed no complaints during this session. She does nod yes to me that she is having nausea. Denies emesis.   ROS: +nausea  Objective: Vital Signs: Blood pressure 114/64, pulse 63, temperature 98.5 F (36.9 C), temperature source Oral, resp. rate 16, height 5\' 2"  (1.575 m), weight 89.8 kg, last menstrual period 09/28/2012, SpO2 100 %. DG Abd 1 View  Result Date: 07/13/2020 CLINICAL DATA:  Abdominal pain EXAM: ABDOMEN - 1 VIEW COMPARISON:  07/11/2020 and prior. FINDINGS: Normal bowel gas pattern. Upper abdominal suture material and cholecystectomy clips. Enteric tube tip overlying the left hemiabdomen is unchanged in positioning. IMPRESSION: Unchanged positioning of enteric tube. Nonobstructive bowel gas pattern. Electronically Signed   By: Primitivo Gauze M.D.   On: 07/13/2020 11:35   No results for input(s): WBC, HGB, HCT, PLT in the last 72 hours. No results for input(s): NA, K, CL, CO2, GLUCOSE, BUN, CREATININE, CALCIUM in the last 72 hours.  Intake/Output Summary (Last 24 hours) at 07/14/2020 1028 Last data filed at 07/14/2020 0700 Gross per 24 hour  Intake 360 ml  Output --  Net 360 ml        Physical Exam: BP 114/64 (BP Location: Right Arm)   Pulse 63   Temp 98.5 F (36.9 C) (Oral)   Resp 16   Ht 5\' 2"  (1.575 m)   Wt 89.8 kg   LMP 09/28/2012   SpO2 100%   BMI 36.21 kg/m   Gen: no distress, normal appearing HEENT: oral mucosa pink and moist, + NG.  Cardio: Reg rate Chest: normal effort, normal rate of breathing Abd: soft, non-distended Ext: no edema Psych: pleasant, normal affect Skin: intact Neuro: Global aphasia Motor: Unable to follow commands, spontaneously moving bilateral upper extremities    Assessment/Plan: 1. Functional deficits which require 3+ hours per day of interdisciplinary therapy in a  comprehensive inpatient rehab setting.  Physiatrist is providing close team supervision and 24 hour management of active medical problems listed below.  Physiatrist and rehab team continue to assess barriers to discharge/monitor patient progress toward functional and medical goals   Care Tool:  Bathing    Body parts bathed by patient: Chest,Face,Front perineal area,Abdomen,Left arm,Right arm,Right upper leg,Left upper leg   Body parts bathed by helper: Buttocks,Right lower leg,Left lower leg     Bathing assist Assist Level: Minimal Assistance - Patient > 75%     Upper Body Dressing/Undressing Upper body dressing   What is the patient wearing?: Pull over shirt    Upper body assist Assist Level: Minimal Assistance - Patient > 75%    Lower Body Dressing/Undressing Lower body dressing      What is the patient wearing?: Pants     Lower body assist Assist for lower body dressing: Minimal Assistance - Patient > 75%     Toileting Toileting    Toileting assist Assist for toileting: Minimal Assistance - Patient > 75%     Transfers Chair/bed transfer  Transfers assist     Chair/bed transfer assist level: Contact Guard/Touching assist     Locomotion Ambulation   Ambulation assist      Assist level: Minimal Assistance - Patient > 75% Assistive device: Walker-rolling Max distance: 92 ft   Walk 10 feet activity   Assist     Assist level: Contact Guard/Touching assist Assistive device: Walker-rolling   Walk 50 feet activity   Assist Walk 50  feet with 2 turns activity did not occur: Safety/medical concerns  Assist level: Contact Guard/Touching assist Assistive device: Walker-rolling    Walk 150 feet activity   Assist Walk 150 feet activity did not occur: Safety/medical concerns         Walk 10 feet on uneven surface  activity   Assist Walk 10 feet on uneven surfaces activity did not occur: Safety/medical concerns          Wheelchair     Assist Will patient use wheelchair at discharge?: No             Wheelchair 50 feet with 2 turns activity    Assist            Wheelchair 150 feet activity     Assist           Medical Problem List and Plan: 1.  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  Antithrombotics: -DVT/anticoagulation: Continue Lovenox             -antiplatelet therapy: Aspirin 81 mg daily and Brilinta 90 mg twice daily 3. Pain Management:  Reports headaches at times- continue Tylenol prn 4. Mood: Provide emotional support             -antipsychotic agents: N/A 5. Neuropsych: This patient is not capable of making decisions on her own behalf. 6. Skin/Wound Care: Routine skin checks 7. Fluids/Electrolytes/Nutrition: Routine and outs            CMP 2/7 reviewed and electrolytes are stable.  8.  Poststroke dysphagia:.  Dysphagia #2 nectar liquids. Dietary follow-up             Advance diet as tolerated  TF spread over longer time period to minimize nausea/emesis.  9.  Seizure prophylaxis.  Keppra 500 mg twice daily.  EEG negative. D/c Keppra. Continues to be seizure free.   10.  Urinary retention: Continue Urecholine 10 mg 3 times daily.  Check PVR 11.  Diet-controlled diabetes mellitus.  Hemoglobin A1c 5.4.  CBGs discontinued             Monitor with increased mobility 12.  Hyperlipidemia: Lipitor 13.  Morbid obesity status post gastric sleeve 05/24/2020.  Dietary follow-up 14.  CKD stage III.  Baseline creatinine 1.41-1.59             Cr 1.33 on 2/7 15. Abdominal pain, nausea, emesis:   2/9: stop TF, repeat KUB, zofran ordered, RD consult placed.   2/10: appreciate RD input: TF have been spread over a longer period of time. XR reviewed and is stable. Abdominal pain and emesis are currently resolved and nausea is still present.   LOS: 5 days A FACE TO FACE EVALUATION WAS  PERFORMED  Clide Deutscher Desira Alessandrini 07/14/2020, 10:28 AM

## 2020-07-14 NOTE — Progress Notes (Signed)
Speech Language Pathology Daily Session Note  Patient Details  Name: Janalyn Higby MRN: [id number] Date of Birth: Jan 11, 1956  Today's Date: 07/14/2020 SLP Individual Time: 8088-1103 SLP Individual Time Calculation (min): 43 min  Short Term Goals: Week 1: SLP Short Term Goal 1 (Week 1): Pt will vocalize on command in 50% of opportunities with mod assist multimodal cues. SLP Short Term Goal 2 (Week 1): Pt will replicate non speech oral movements in 25% of opportunities with max assist multimodal cues. SLP Short Term Goal 3 (Week 1): Pt will match picture to word from a field of two for 50% accuracy with max assist multimodal cues. SLP Short Term Goal 4 (Week 1): Pt will consume dys 2 textures and nectar thick liquids with min assist cues for use of swallowing precautions and minimal overt s/s of aspiration. SLP Short Term Goal 5 (Week 1): Pt will consume therapeutic trials of thin liquids with min assist cues for use of swallowing precautions and minimal overt s/s of aspiration.  Skilled Therapeutic Interventions: Pt was seen for skilled ST targeting dysphagia and communication goals. Pt competed oral care via suction toothbrush with set up assistance. She then accepted ~4oz thin H2O with 1 delayed cough noted at end of intake. She was still slightly impulsive with intake today, but no tactile assist required to self monitor bolus size; instead she responded to Min A verbal cues for slower, smaller sips. Recommend continue current diet, but pt making good progress toward repeat MBSS. Pt was only able to follow 1 out of 4 non-speech oral motor commands from SLP with Maximal verbal and visual cueing, and when paired with voicing (despite SLPs attempts to cue her not to voice during that time). Pt demonstrated ability to alternate between "me" and "my" X5. She also produced "bee" and "see" with ~50% accuracy with Max A phonemic models and paired with an orthographic cue. In addition, when matching pictures  (from filed of 2) to words, she accurately produced the word "moon", even though she was unable to do so earlier in session with only picture support an phonemic model; orthographic cues due appear to be helpful to pt in order to facilitate more accurate verbalizations, although still not consistent. She matched pictures to words with 7/8 accuracy today. Pt left laying in bed with alarm set and needs within reach, MD present. Continue per current plan of care.        Pain Pain Assessment Pain Scale: 0-10 Pain Score: 0-No pain  Therapy/Group: Individual Therapy  Arbutus Leas 07/14/2020, 7:19 AM

## 2020-07-14 NOTE — Plan of Care (Signed)
  Problem: Consults Goal: RH STROKE PATIENT EDUCATION Description: See Patient Education module for education specifics  Outcome: Progressing   Problem: RH BOWEL ELIMINATION Goal: RH STG MANAGE BOWEL WITH ASSISTANCE Description: STG Manage Bowel with Mod I Assistance. Outcome: Progressing Goal: RH STG MANAGE BOWEL W/MEDICATION W/ASSISTANCE Description: STG Manage Bowel with Medication with Mod I Assistance. Outcome: Progressing   Problem: RH BLADDER ELIMINATION Goal: RH STG MANAGE BLADDER WITH ASSISTANCE Description: STG Manage Bladder With Mod I Assistance Outcome: Progressing Goal: RH STG MANAGE BLADDER WITH MEDICATION WITH ASSISTANCE Description: STG Manage Bladder With Medication With Mod I Assistance. Outcome: Progressing   Problem: RH SAFETY Goal: RH STG ADHERE TO SAFETY PRECAUTIONS W/ASSISTANCE/DEVICE Description: STG Adhere to Safety Precautions With Mod I Assistance/Device. Outcome: Progressing Goal: RH STG DECREASED RISK OF FALL WITH ASSISTANCE Description: STG Decreased Risk of Fall With Mod I Assistance. Outcome: Progressing   Problem: RH COGNITION-NURSING Goal: RH STG USES MEMORY AIDS/STRATEGIES W/ASSIST TO PROBLEM SOLVE Description: STG Uses Memory Aids/Strategies With Mod I Assistance to Problem Solve. Outcome: Progressing Goal: RH STG ANTICIPATES NEEDS/CALLS FOR ASSIST W/ASSIST/CUES Description: STG Anticipates Needs/Calls for Assist With Mod I Assistance/Cues. Outcome: Progressing   Problem: RH PAIN MANAGEMENT Goal: RH STG PAIN MANAGED AT OR BELOW PT'S PAIN GOAL Description: Assess and treat pain q shift and as needed Outcome: Progressing

## 2020-07-14 NOTE — Progress Notes (Signed)
Occupational Therapy Session Note  Patient Details  Name: Courtney Burke MRN: [id number] Date of Birth: 11/13/1955  Today's Date: 07/14/2020 OT Individual Time: 1000-1058 OT Individual Time Calculation (min): 58 min    Short Term Goals: Week 1:  OT Short Term Goal 1 (Week 1): Pt will complete toileting with Min A OT Short Term Goal 2 (Week 1): Pt will don overhead shirt with CGA OT Short Term Goal 3 (Week 1): Pt will complete oral care with Min A and no more than min cues for sequencing  Skilled Therapeutic Interventions/Progress Updates:    Pt received semi-reclined in bed, agreeable to therapy. LPN disconnected tube feed at beginning of session. Session focus on RUE in prep for improved ADL performance. Supine > sitting EOB with use of bed rail + close S. Donned B shoes with close S, donned TEDS with total A. Doffed/donned shirt with close S. Amb total of ~20 ft throughout session with RW + CGA + min VCs for safe RW use/to advance RLE. Toilet transfer, LB clothing management, and standing anterior peri care with RW + CGA for balance. Cont void of bladder. Washed hands and combed hair seated at sink with set-up A. Transported to gym in w/c 2/2 time management, stand-pivot to mat with RW + CGA. Placed level 1-4 resistive clothes pins on horizonal bar with RUE, fatiguing halfway through and req seated rest break and cool wash cloth as she gestured she was hot. Min A from LUE to achieve 3 jaw chuck grasp on clothes pin.  Assessments: Assessed B grip strength via hand dynameter with the following results:  L: 14,16,20; average of 17 lbs R: 16,10,10; average of 12 lbs  9HPT: L secs 34s; R: 2 min 49 secs  Req mod demonstrational cuing to appropriately complete assessments.   4 lb dowel rod 1x20: forward rows, bicep curls, and chest press with mod demonstrational cuing for technique. Stand-pivot > wc > bed same manner as before. Sitting EOB > supine with close S. Pt left with HOB at 30 degrees  with bed alarm engaged, call bell in reach, and all immediate needs met.    Therapy Documentation Precautions:  Precautions Precautions: Fall Precaution Comments: globally aphasic, NG tube Restrictions Weight Bearing Restrictions: No    Pain: Pain Assessment Pain Scale: 0-10 Pain Score: 0-No pain ADL: See Care Tool for more details.   Therapy/Group: Individual Therapy  Volanda Napoleon MS, OTR/L  07/14/2020, 12:34 PM

## 2020-07-15 LAB — GLUCOSE, CAPILLARY
Glucose-Capillary: 127 mg/dL — ABNORMAL HIGH (ref 70–99)
Glucose-Capillary: 138 mg/dL — ABNORMAL HIGH (ref 70–99)
Glucose-Capillary: 161 mg/dL — ABNORMAL HIGH (ref 70–99)
Glucose-Capillary: 100 mg/dL — ABNORMAL HIGH (ref 70–99)
Glucose-Capillary: 92 mg/dL (ref 70–99)
Glucose-Capillary: 121 mg/dL — ABNORMAL HIGH (ref 70–99)
Glucose-Capillary: 130 mg/dL — ABNORMAL HIGH (ref 70–99)

## 2020-07-15 MED ORDER — OSMOLITE 1.2 CAL PO LIQD
1000.0000 mL | ORAL | Status: DC
  Administered 2020-07-15: 1000 mL
  Filled 2020-07-15: qty 1000

## 2020-07-15 NOTE — Progress Notes (Signed)
Patient ID: Courtney Burke, female   DOB: Mar 21, 1956, 65 y.o.   MRN: [id number] Met with the patient to review role of the nurse CM and address educational needs. Given global aphasia, difficult to discern comprehension of information however appeared receptive is better than expressive and acknowledged. Attempted to ask questions however speech is difficult to follow. Left packet of information in room for family. Will continue to follow along to discharge to address educational needs and collaborate with the SW to facilitate preparation for discharge. Margarito Liner

## 2020-07-15 NOTE — Progress Notes (Signed)
Physical Therapy Session Note  Patient Details  Name: Courtney Burke MRN: [id number] Date of Birth: 05-27-1956  Today's Date: 07/15/2020 PT Individual Time: 3888-2800 PT Individual Time Calculation (min): 76 min   Short Term Goals: Week 1:  PT Short Term Goal 1 (Week 1): Pt will increase bed mobility to S. PT Short Term Goal 2 (Week 1): Pt will increase transfers to c/g. PT Short Term Goal 3 (Week 1): Pt will increase ambulation with LRAD about 100 feet with c/g PT Short Term Goal 4 (Week 1): Pt will ascend/descend 4 stairs with B rails and c/g..  Skilled Therapeutic Interventions/Progress Updates:    Patient supine in bed upon PT arrival. Patient alert and agreeable to PT session. Patient denied pain during session. Pt requesting to use bathroom prior to session and completed while awaiting continuous feeding to be discontinued by RN.  Therapeutic Activity: Bed Mobility: Supine to/from sit performed with Mod I. No vc required.  Transfers: STS and SPVT transfers performed throughout session with supervision. VC for Bil use of UE to push from w/c armrests for improved efficiency. Toilet transfer and clothing mgmt performed with supervision.  Gait Training:  Patient ambulated 110'x1/ 52' x1/ 100' x 1 using RW with CGA. Ambulated with decreased step height/ length on R, crowding to L side of walker. Provided verbal cues for increasing R step height with pt only returning demonstration ~10% of total ambulation. Attempted use of mirror for visualization of low step height with pt still unable to correct functionally.  Neuromuscular Re-ed: NMR facilitated during session with focus onstnding balance and R side attention. Initially, pt guided in standing high knee marches with pt to hit target set for pt to reach 90/90 with each knee. L knee is able to hit target > 90% of attempts. R knee <50% of attempts. Requires vc and encouragement for increasing effort in attempts. Pt also guided in use of BITS  for static/ dynamic reaching for single targets at user's pace. Each trial set for 2 minutes. First trial completed with pt initially unable to find t rgets to R side without cues to look to R side. Improves within first 30 seconds. Completes with size 8 target and 74.51% accuracy with 3.07sec reaction time and 38 targets hit. Second trial Completed with size 8 target and 73.85% accuracy with 2.49sec reaction time and 48 targets hit. Third trial completed from a foot further away from screen, with size 6 target and 71.43% accuracy with 2.29sec reaction time and 50 targets hit. Demonstrating improvement in tending to entire visual field for targets. Accuracy numbers fluctuate d/t pt's inability to point only one finger out to screen.   NMR performed for improvements in motor control and coordination, balance, sequencing, judgement, and self confidence/ efficacy in performing all aspects of mobility at highest level of independence.   Patient supine in bed at end of session with brakes locked, bed alarm set, and all needs within reach.  Therapy Documentation Precautions:  Precautions Precautions: Fall Precaution Comments: globally aphasic, NG tube Restrictions Weight Bearing Restrictions: No    Therapy/Group: Individual Therapy  Alger Simons 07/15/2020, 4:06 PM

## 2020-07-15 NOTE — Progress Notes (Signed)
Occupational Therapy Session Note  Patient Details  Name: Courtney Burke MRN: [id number] Date of Birth: 06-15-1955  Today's Date: 07/15/2020 OT Individual Time: 0912-1020 OT Individual Time Calculation (min): 68 min    Short Term Goals: Week 1:  OT Short Term Goal 1 (Week 1): Pt will complete toileting with Min A OT Short Term Goal 2 (Week 1): Pt will don overhead shirt with CGA OT Short Term Goal 3 (Week 1): Pt will complete oral care with Min A and no more than min cues for sequencing  Skilled Therapeutic Interventions/Progress Updates:    Pt received semi reclined in bed with RN present, agreeable to therapy. Session focus on toileting, showering, dressing, seated grooming and RUE NMR in prep for improved ADL performance. Supine > sitting EOB with close S. Donned/doffed B shoes with close S. STS and amb to toilet to complete toilet transfer with RW + CGA. CGA for LB clothing management and standing anterior peri care. Cont void of bladder. TTB transfer with CGA for balance + min VCs for sequencing + RW. Doffed/donned shirt with close S. Doffed/donned pants/brief with CGA for STS. Bathed UB and LB with close S, lateral leans to bathe buttocks + use of front grab bar. Amb to w/c to get dressed same manner as above. Seated, completed oral care with tooth brush and donned wig with close S. Donned TEDs with total A. Transported to gym in w/c 2/2 time management. Completed the following games on BITS to target dynamic seated/standing balance, activity tolerance, R visual scanning, and RUE NMR.  Game: Single Target Accuracy: 43.66% Reaction Time: 3.84 Duration Time: 2 min Hits: 31 Accuracy: 62.22% Reaction Time: 4.13 Duration Time: 2 min Hits: 28  Initiated seated rest break after first trial 2/2 fatigue, req max verbal/gestural cues to identify targets on R side of screen 2/2 R inattention/decreased R visual scanning. Completed second trial with stylus, noted decreased accuracy with R hand and  undershooting target.  Transported back to room and amb back to bed same manner as before, sitting EOB > supine with close S. Pt left with HOB at 30 degrees, bed alarm engaged, call bell in reach, and all immediate needs met.    Therapy Documentation Precautions:  Precautions Precautions: Fall Precaution Comments: globally aphasic, NG tube Restrictions Weight Bearing Restrictions: No Pain: Pain Assessment Pain Scale: Faces Pain Score: 0-No pain Faces Pain Scale: No hurt ADL: See Care Tool for more details. Therapy/Group: Individual Therapy  Volanda Napoleon MS, OTR/L  07/15/2020, 10:27 AM

## 2020-07-15 NOTE — Progress Notes (Signed)
[facility] PHYSICAL MEDICINE & REHABILITATION PROGRESS NOTE  Subjective/Complaints: Much better comprehension and expression! She indicates that she does have some nausea. Denies abdominal pain. Nursing reports that she is not eating much-discussed RD recommendations for small meals due to her nausea/abdominal pain  ROS: +nausea, denies abdominal pain.   Objective: Vital Signs: Blood pressure (!) 93/56, pulse 61, temperature 98.4 F (36.9 C), resp. rate 14, height 5\' 2"  (1.575 m), weight 90.7 kg, last menstrual period 09/28/2012, SpO2 100 %. DG Abd 1 View  Result Date: 07/13/2020 CLINICAL DATA:  Abdominal pain EXAM: ABDOMEN - 1 VIEW COMPARISON:  07/11/2020 and prior. FINDINGS: Normal bowel gas pattern. Upper abdominal suture material and cholecystectomy clips. Enteric tube tip overlying the left hemiabdomen is unchanged in positioning. IMPRESSION: Unchanged positioning of enteric tube. Nonobstructive bowel gas pattern. Electronically Signed   By: Primitivo Gauze M.D.   On: 07/13/2020 11:35   No results for input(s): WBC, HGB, HCT, PLT in the last 72 hours. No results for input(s): NA, K, CL, CO2, GLUCOSE, BUN, CREATININE, CALCIUM in the last 72 hours.  Intake/Output Summary (Last 24 hours) at 07/15/2020 0931 Last data filed at 07/14/2020 1803 Gross per 24 hour  Intake 810 ml  Output 0 ml  Net 810 ml        Physical Exam: BP (!) 93/56 (BP Location: Left Arm)   Pulse 61   Temp 98.4 F (36.9 C)   Resp 14   Ht 5\' 2"  (1.575 m)   Wt 90.7 kg   LMP 09/28/2012   SpO2 100%   BMI 36.57 kg/m   Gen: no distress, normal appearing HEENT: oral mucosa pink and moist, + NG.  Cardio: Reg rate Chest: normal effort, normal rate of breathing Abd: soft, non-distended Ext: no edema Psych: pleasant, normal affect Skin: intact Neuro: Global aphasia Motor: Unable to follow commands, spontaneously moving bilateral upper extremities:    Assessment/Plan: 1. Functional deficits which  require 3+ hours per day of interdisciplinary therapy in a comprehensive inpatient rehab setting.  Physiatrist is providing close team supervision and 24 hour management of active medical problems listed below.  Physiatrist and rehab team continue to assess barriers to discharge/monitor patient progress toward functional and medical goals   Care Tool:  Bathing    Body parts bathed by patient: Chest,Face,Front perineal area,Abdomen,Left arm,Right arm,Right upper leg,Left upper leg   Body parts bathed by helper: Buttocks,Right lower leg,Left lower leg     Bathing assist Assist Level: Minimal Assistance - Patient > 75%     Upper Body Dressing/Undressing Upper body dressing   What is the patient wearing?: Pull over shirt    Upper body assist Assist Level: Supervision/Verbal cueing    Lower Body Dressing/Undressing Lower body dressing      What is the patient wearing?: Pants     Lower body assist Assist for lower body dressing: Minimal Assistance - Patient > 75%     Toileting Toileting    Toileting assist Assist for toileting: Contact Guard/Touching assist     Transfers Chair/bed transfer  Transfers assist     Chair/bed transfer assist level: Minimal Assistance - Patient > 75%     Locomotion Ambulation   Ambulation assist      Assist level: Minimal Assistance - Patient > 75% Assistive device: Walker-rolling Max distance: 90   Walk 10 feet activity   Assist     Assist level: Contact Guard/Touching assist Assistive device: Walker-rolling   Walk 50 feet activity   Assist Walk  50 feet with 2 turns activity did not occur: Safety/medical concerns  Assist level: Contact Guard/Touching assist Assistive device: Walker-rolling    Walk 150 feet activity   Assist Walk 150 feet activity did not occur: Safety/medical concerns         Walk 10 feet on uneven surface  activity   Assist Walk 10 feet on uneven surfaces activity did not occur:  Safety/medical concerns         Wheelchair     Assist Will patient use wheelchair at discharge?: No             Wheelchair 50 feet with 2 turns activity    Assist            Wheelchair 150 feet activity     Assist           Medical Problem List and Plan: 1.  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  Antithrombotics: -DVT/anticoagulation: Continue Lovenox             -antiplatelet therapy: Aspirin 81 mg daily and Brilinta 90 mg twice daily 3. Pain Management:  Reports headaches at times- continue Tylenol prn 4. Mood: Provide emotional support             -antipsychotic agents: N/A 5. Neuropsych: This patient is not capable of making decisions on her own behalf. 6. Skin/Wound Care: Routine skin checks 7. Fluids/Electrolytes/Nutrition: Routine and outs            CMP 2/7 reviewed and electrolytes are stable.  8.  Poststroke dysphagia:.  Dysphagia #2 nectar liquids. Dietary follow-up             Advance diet as tolerated  TF spread over longer time period to minimize nausea/emesis.  9.  Seizure prophylaxis.  Keppra 500 mg twice daily.  EEG negative. D/c Keppra. Continues to be seizure free.   10.  Urinary retention: Continue Urecholine 10 mg 3 times daily.  Check PVR 11.  Diet-controlled diabetes mellitus.  Hemoglobin A1c 5.4.  CBGs discontinued 12.  Hyperlipidemia: Lipitor 13.  Morbid obesity status post gastric sleeve 05/24/2020.  Dietary follow-up 14.  CKD stage III.  Baseline creatinine 1.41-1.59             Cr 1.33 on 2/7 15. Abdominal pain, nausea, emesis:   2/9: stop TF, repeat KUB, zofran ordered, RD consult placed.   2/10: appreciate RD input: TF have been spread over a longer period of time. XR reviewed and is stable. Abdominal pain and emesis are currently resolved and nausea is still present.   2/11: improving, continue small meals, zofran PRN  LOS:  6 days A FACE TO FACE EVALUATION WAS PERFORMED  Clide Deutscher Caroleena Paolini 07/15/2020, 9:31 AM

## 2020-07-15 NOTE — Progress Notes (Signed)
Speech Language Pathology Daily Session Note  Patient Details  Name: Courtney Burke MRN: [id number] Date of Birth: [birth year]  Today's Date: 07/15/2020 SLP Individual Time: 0730-0825 SLP Individual Time Calculation (min): 55 min  Short Term Goals: Week 1: SLP Short Term Goal 1 (Week 1): Pt will vocalize on command in 50% of opportunities with mod assist multimodal cues. SLP Short Term Goal 2 (Week 1): Pt will replicate non speech oral movements in 25% of opportunities with max assist multimodal cues. SLP Short Term Goal 3 (Week 1): Pt will match picture to word from a field of two for 50% accuracy with max assist multimodal cues. SLP Short Term Goal 4 (Week 1): Pt will consume dys 2 textures and nectar thick liquids with min assist cues for use of swallowing precautions and minimal overt s/s of aspiration. SLP Short Term Goal 5 (Week 1): Pt will consume therapeutic trials of thin liquids with min assist cues for use of swallowing precautions and minimal overt s/s of aspiration.  Skilled Therapeutic Interventions: Pt was seen for skilled ST targeting dysphagia and communication goals. SLP facilitated session with encouragement for increased PO intake and Supervision A verbal cues for use of swallowing strategies during pt's consumption of ~30% of current diet Dys 2/nectar thick liquid breakfast. No overt s/sx aspiration observed. Although attempts made to provide advanced texture trials, pt refused all further POs. Recommend continue current diet.  Given improvements noted in pt's receptive language skills in yesterday's session, attempted to trial a basic 6 picture/word communication board to work toward communication of wants and needs. Despite multiple models pt unable to use functionally without Total A. She did demonstrated ability to correct incorrect responses (via pointing to pictures) when cues to "point the the picture of the ___" with 2/6 accuracy. Pt is still frequently attempting to  communicate via verbal expression, however 95% of utterances are neologisms, and it is difficult to clarify her intent/messages even with cues to rephrase into yes/no format. During a basic CV and CVC word task, pt produced simple words with 6/10 accuracy when provided Max A phonemic and orthographic cues. Pt unable to repeat without orthographic cue. Other accurate verbalizations throughout session included "I need" "medicine" and "thank you". Pt left laying in bed with alarm set and needs within reach. Continue per current plan of care.       Pain Pain Assessment Pain Scale: 0-10 Pain Score: 0-No pain   Therapy/Group: Individual Therapy  Arbutus Leas 07/15/2020, 7:18 AM

## 2020-07-15 NOTE — Plan of Care (Signed)
  Problem: Consults Goal: RH STROKE PATIENT EDUCATION Description: See Patient Education module for education specifics  Outcome: Progressing   Problem: RH BOWEL ELIMINATION Goal: RH STG MANAGE BOWEL WITH ASSISTANCE Description: STG Manage Bowel with Mod I Assistance. Outcome: Progressing Goal: RH STG MANAGE BOWEL W/MEDICATION W/ASSISTANCE Description: STG Manage Bowel with Medication with Mod I Assistance. Outcome: Progressing   Problem: RH BLADDER ELIMINATION Goal: RH STG MANAGE BLADDER WITH ASSISTANCE Description: STG Manage Bladder With Mod I Assistance Outcome: Progressing Goal: RH STG MANAGE BLADDER WITH MEDICATION WITH ASSISTANCE Description: STG Manage Bladder With Medication With Mod I Assistance. Outcome: Progressing   Problem: RH SAFETY Goal: RH STG ADHERE TO SAFETY PRECAUTIONS W/ASSISTANCE/DEVICE Description: STG Adhere to Safety Precautions With Mod I Assistance/Device. Outcome: Progressing Goal: RH STG DECREASED RISK OF FALL WITH ASSISTANCE Description: STG Decreased Risk of Fall With Mod I Assistance. Outcome: Progressing   Problem: RH COGNITION-NURSING Goal: RH STG USES MEMORY AIDS/STRATEGIES W/ASSIST TO PROBLEM SOLVE Description: STG Uses Memory Aids/Strategies With Mod I Assistance to Problem Solve. Outcome: Progressing Goal: RH STG ANTICIPATES NEEDS/CALLS FOR ASSIST W/ASSIST/CUES Description: STG Anticipates Needs/Calls for Assist With Mod I Assistance/Cues. Outcome: Progressing   Problem: RH PAIN MANAGEMENT Goal: RH STG PAIN MANAGED AT OR BELOW PT'S PAIN GOAL Description: Assess and treat pain q shift and as needed Outcome: Progressing

## 2020-07-16 DIAGNOSIS — I69391 Dysphagia following cerebral infarction: Secondary | ICD-10-CM

## 2020-07-16 DIAGNOSIS — I69320 Aphasia following cerebral infarction: Principal | ICD-10-CM

## 2020-07-16 LAB — GLUCOSE, CAPILLARY
Glucose-Capillary: 113 mg/dL — ABNORMAL HIGH (ref 70–99)
Glucose-Capillary: 114 mg/dL — ABNORMAL HIGH (ref 70–99)
Glucose-Capillary: 116 mg/dL — ABNORMAL HIGH (ref 70–99)
Glucose-Capillary: 121 mg/dL — ABNORMAL HIGH (ref 70–99)
Glucose-Capillary: 129 mg/dL — ABNORMAL HIGH (ref 70–99)

## 2020-07-16 MED ORDER — OSMOLITE 1.2 CAL PO LIQD
540.0000 mL | ORAL | Status: DC
  Administered 2020-07-16 – 2020-07-17 (×2): 540 mL
  Filled 2020-07-16 (×2): qty 711

## 2020-07-16 MED ORDER — FREE WATER
150.0000 mL | Freq: Four times a day (QID) | Status: DC
  Administered 2020-07-16 – 2020-07-24 (×19): 150 mL

## 2020-07-16 NOTE — Progress Notes (Signed)
Speech Language Pathology Daily Session Note  Patient Details  Name: Courtney Burke MRN: [id number] Date of Birth: 1955-09-04  Today's Date: 07/16/2020 SLP Individual Time: 1030-1100 SLP Individual Time Calculation (min): 30 min  Short Term Goals: Week 1: SLP Short Term Goal 1 (Week 1): Pt will vocalize on command in 50% of opportunities with mod assist multimodal cues. SLP Short Term Goal 2 (Week 1): Pt will replicate non speech oral movements in 25% of opportunities with max assist multimodal cues. SLP Short Term Goal 3 (Week 1): Pt will match picture to word from a field of two for 50% accuracy with max assist multimodal cues. SLP Short Term Goal 4 (Week 1): Pt will consume dys 2 textures and nectar thick liquids with min assist cues for use of swallowing precautions and minimal overt s/s of aspiration. SLP Short Term Goal 5 (Week 1): Pt will consume therapeutic trials of thin liquids with min assist cues for use of swallowing precautions and minimal overt s/s of aspiration.  Skilled Therapeutic Interventions:   Patient seen for skilled ST visit focusing on dysphagia and aphasia goals. Patient able to perform oral care after setup A from SLP. She consumed 3-4 cups sips of thin liquids (water) with appearance of timely swallow initiation and without overt s/s of aspiration or penetration. Voice remained clear throughout. Patient was able to recognize when SLP wrote her name incorrectly on dry erase board as well as when incorrect birthdate written on board. She was able to complete automatic speech tasks of days of week and months of year with SLP writing down months and patient reading them, and then progressing to patient being able to read first two months then spontaneously say next month. Without significant cues, patient unable to perform automatic speech tasks, perseverating on a single month. She was not able to count  But did identify numbers written on dry erase board. When SLP saying  goodbye, patient said "thank you". Patient continues to benefit from skilled SLP intervention to maximize speech-language, cognitive and swallow function prior to discharge.  Pain Pain Assessment Pain Scale: Faces Pain Score: 0-No pain Faces Pain Scale: No hurt  Therapy/Group: Individual Therapy  Sonia Baller, MA, CCC-SLP Speech Therapy

## 2020-07-16 NOTE — Progress Notes (Signed)
[facility] PHYSICAL MEDICINE & REHABILITATION PROGRESS NOTE  Subjective/Complaints: Pt awake in bed. Denied headache although she had some back pain.   ROS: limited due to language/communication   Objective: Vital Signs: Blood pressure (!) 97/51, pulse 78, temperature 97.9 F (36.6 C), temperature source Oral, resp. rate 18, height 5\' 2"  (1.575 m), weight 90.2 kg, last menstrual period 09/28/2012, SpO2 96 %. No results found. No results for input(s): WBC, HGB, HCT, PLT in the last 72 hours. No results for input(s): NA, K, CL, CO2, GLUCOSE, BUN, CREATININE, CALCIUM in the last 72 hours.  Intake/Output Summary (Last 24 hours) at 07/16/2020 1059 Last data filed at 07/16/2020 0800 Gross per 24 hour  Intake 120 ml  Output --  Net 120 ml        Physical Exam: BP (!) 97/51 (BP Location: Left Arm)   Pulse 78   Temp 97.9 F (36.6 C) (Oral)   Resp 18   Ht 5\' 2"  (1.575 m)   Wt 90.2 kg   LMP 09/28/2012   SpO2 96%   BMI 36.37 kg/m   Constitutional: No distress . Vital signs reviewed. HEENT: EOMI, oral membranes moist, NGT Neck: supple Cardiovascular: RRR without murmur. No JVD    Respiratory/Chest: CTA Bilaterally without wheezes or rales. Normal effort    GI/Abdomen: BS +, non-tender, non-distended Ext: no clubbing, cyanosis, or edema Psych: pleasant and cooperative Skin: intact Neuro: Global aphasia- but communicated to me with some appropriate words and short phrases today Motor: Unable to follow commands, spontaneously moving bilateral upper extremities:    Assessment/Plan: 1. Functional deficits which require 3+ hours per day of interdisciplinary therapy in a comprehensive inpatient rehab setting.  Physiatrist is providing close team supervision and 24 hour management of active medical problems listed below.  Physiatrist and rehab team continue to assess barriers to discharge/monitor patient progress toward functional and medical goals   Care Tool:  Bathing     Body parts bathed by patient: Chest,Face,Front perineal area,Abdomen,Left arm,Right arm,Right upper leg,Left upper leg,Left lower leg,Right lower leg,Buttocks   Body parts bathed by helper: Buttocks,Right lower leg,Left lower leg     Bathing assist Assist Level: Supervision/Verbal cueing     Upper Body Dressing/Undressing Upper body dressing   What is the patient wearing?: Pull over shirt    Upper body assist Assist Level: Supervision/Verbal cueing    Lower Body Dressing/Undressing Lower body dressing      What is the patient wearing?: Pants,Incontinence brief     Lower body assist Assist for lower body dressing: Contact Guard/Touching assist     Toileting Toileting    Toileting assist Assist for toileting: Contact Guard/Touching assist     Transfers Chair/bed transfer  Transfers assist     Chair/bed transfer assist level: Contact Guard/Touching assist     Locomotion Ambulation   Ambulation assist      Assist level: Contact Guard/Touching assist Assistive device: Walker-rolling Max distance: 90   Walk 10 feet activity   Assist     Assist level: Contact Guard/Touching assist Assistive device: Walker-rolling   Walk 50 feet activity   Assist Walk 50 feet with 2 turns activity did not occur: Safety/medical concerns  Assist level: Contact Guard/Touching assist Assistive device: Walker-rolling    Walk 150 feet activity   Assist Walk 150 feet activity did not occur: Safety/medical concerns         Walk 10 feet on uneven surface  activity   Assist Walk 10 feet on uneven surfaces activity did  not occur: Safety/medical concerns         Wheelchair     Assist Will patient use wheelchair at discharge?: No             Wheelchair 50 feet with 2 turns activity    Assist            Wheelchair 150 feet activity     Assist           Medical Problem List and Plan: 1.  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  Antithrombotics: -DVT/anticoagulation: Continue Lovenox             -antiplatelet therapy: Aspirin 81 mg daily and Brilinta 90 mg twice daily 3. Pain Management:  Reports headaches at times- continue Tylenol prn 4. Mood: Provide emotional support             -antipsychotic agents: N/A 5. Neuropsych: This patient is not capable of making decisions on her own behalf. 6. Skin/Wound Care: Routine skin checks 7. Fluids/Electrolytes/Nutrition: Routine and outs            CMP 2/7 reviewed and electrolytes are stable.  8.  Poststroke dysphagia:.  Dysphagia #2 nectar liquids. Dietary follow-up             Advance diet as tolerated  TF spread over longer time period to minimize nausea/emesis which has helped  2/12 -let's reduce TF to just evenings to help stimulate po intake during day   -adjust H2O boluses 9.  Seizure prophylaxis.  Keppra 500 mg twice daily.  EEG negative. D/c Keppra. Continues to be seizure free.   10.  Urinary retention: Continue Urecholine 10 mg 3 times daily.  Check PVR 11.  Diet-controlled diabetes mellitus.  Hemoglobin A1c 5.4.  CBGs discontinued 12.  Hyperlipidemia: Lipitor 13.  Morbid obesity status post gastric sleeve 05/24/2020.  Dietary follow-up 14.  CKD stage III.  Baseline creatinine 1.41-1.59             Cr 1.33 on 2/7 15. Abdominal pain, nausea, emesis:   2/9: stop TF, repeat KUB, zofran ordered, RD consult placed.   2/10: appreciate RD input: TF have been spread over a longer period of time. XR reviewed and is stable. Abdominal pain and emesis are currently resolved and nausea is still present.   2/11-12: improving, continue small meals, zofran PRN   -TF changed to HS     LOS: 7 days A FACE TO [place] 07/16/2020, 10:59 AM

## 2020-07-16 NOTE — Progress Notes (Signed)
Occupational Therapy Session Note  Patient Details  Name: Courtney Burke MRN: [id number] Date of Birth: 1955-12-17  Today's Date: 07/16/2020 OT Individual Time: 1357-1430 OT Individual Time Calculation (min): 33 min    Short Term Goals: Week 1:  OT Short Term Goal 1 (Week 1): Pt will complete toileting with Min A OT Short Term Goal 2 (Week 1): Pt will don overhead shirt with CGA OT Short Term Goal 3 (Week 1): Pt will complete oral care with Min A and no more than min cues for sequencing  Skilled Therapeutic Interventions/Progress Updates:    Pt received in w/c, agreeable to therapy. Declining out of room therapy as her hair was being finished by her daughter. Doffed/doned shirt with close S, doffed/donned pants/brief with CGA during STS. Discussed bathroom set-up with daughter, she will have to check to see if they still have TTB. Additionally discussed need for 3in1 and potentially grab bars for shower. Discussed current progress of pt. Finally, in prep for improved RUE NMR/FMC, pt flipped over cards with 2 lb wrist weight on RUE and washed bedside table with rag, req consistent TCs to restrain LUE from helping. Pt left in w/c with daughter present, call bell in reach, and all immediate needs met.    Therapy Documentation Precautions:  Precautions Precautions: Fall Precaution Comments: globally aphasic, NG tube Restrictions Weight Bearing Restrictions: No Pain: Pain Assessment Pain Scale: 0-10 Pain Score: 0-No pain   ADL: See Care Tool for more details.   Therapy/Group: Individual Therapy  Volanda Napoleon MS, OTR/L  07/16/2020, 2:36 PM

## 2020-07-16 NOTE — Progress Notes (Signed)
Physical Therapy Session Note  Patient Details  Name: Courtney Burke MRN: [id number] Date of Birth: July 20, 1955  Today's Date: 07/16/2020 PT Individual Time: 0805-0900 PT Individual Time Calculation (min): 55 min   Short Term Goals: Week 1:  PT Short Term Goal 1 (Week 1): Pt will increase bed mobility to S. PT Short Term Goal 2 (Week 1): Pt will increase transfers to c/g. PT Short Term Goal 3 (Week 1): Pt will increase ambulation with LRAD about 100 feet with c/g PT Short Term Goal 4 (Week 1): Pt will ascend/descend 4 stairs with B rails and c/g..  Skilled Therapeutic Interventions/Progress Updates:   Pt received supine in bed and agreeable to PT. Supine>sit transfer with supervision assist and heavy use of bed rails.   Toileting with CGA for ambulatory transfer to bathroom and balance while performing pericare in standing. Clothing management perform by pt.   Gait training with RW and emphasis on step height on and length on the RLE to step over yoga block 2 x 106f and to kick yoga block x [id number] simulate movement without block present. Mild carryover to improve step height/length noted.  Side stepping at rail in hall. Forward/reverse gait training with RUE support on rail with min assist for improved pelvic rotation. Completed 2 x 10 ft each. .   Dynamic balance training with force use of RUE on bits, visual scanning and tracing. Min cues for visual scanning and increased shoulder ROM at end range. No LOB throughout with 1 UE supported on RW  Pt returned to room and performed stand pivot transfer to bed with supervision assist and RW. Sit>supine completed without assist,  and left supine in bed with call bell in reach and all needs met.       Therapy Documentation Precautions:  Precautions Precautions: Fall Precaution Comments: globally aphasic, NG tube Restrictions Weight Bearing Restrictions: No   Pain: Pain Assessment Pain Scale: Faces Faces Pain Scale: No  hurt   Therapy/Group: Individual Therapy  AusLorie Phenix12/2022, 9:52 AM

## 2020-07-16 NOTE — Plan of Care (Signed)
  Problem: Consults Goal: RH STROKE PATIENT EDUCATION Description: See Patient Education module for education specifics  Outcome: Progressing   Problem: RH BOWEL ELIMINATION Goal: RH STG MANAGE BOWEL WITH ASSISTANCE Description: STG Manage Bowel with Mod I Assistance. Outcome: Progressing Goal: RH STG MANAGE BOWEL W/MEDICATION W/ASSISTANCE Description: STG Manage Bowel with Medication with Mod I Assistance. Outcome: Progressing   Problem: RH BLADDER ELIMINATION Goal: RH STG MANAGE BLADDER WITH ASSISTANCE Description: STG Manage Bladder With Mod I Assistance Outcome: Progressing Goal: RH STG MANAGE BLADDER WITH MEDICATION WITH ASSISTANCE Description: STG Manage Bladder With Medication With Mod I Assistance. Outcome: Progressing   Problem: RH SAFETY Goal: RH STG ADHERE TO SAFETY PRECAUTIONS W/ASSISTANCE/DEVICE Description: STG Adhere to Safety Precautions With Mod I Assistance/Device. Outcome: Progressing Goal: RH STG DECREASED RISK OF FALL WITH ASSISTANCE Description: STG Decreased Risk of Fall With Mod I Assistance. Outcome: Progressing   Problem: RH COGNITION-NURSING Goal: RH STG USES MEMORY AIDS/STRATEGIES W/ASSIST TO PROBLEM SOLVE Description: STG Uses Memory Aids/Strategies With Mod I Assistance to Problem Solve. Outcome: Progressing Goal: RH STG ANTICIPATES NEEDS/CALLS FOR ASSIST W/ASSIST/CUES Description: STG Anticipates Needs/Calls for Assist With Mod I Assistance/Cues. Outcome: Progressing   Problem: RH PAIN MANAGEMENT Goal: RH STG PAIN MANAGED AT OR BELOW PT'S PAIN GOAL Description: Assess and treat pain q shift and as needed Outcome: Progressing

## 2020-07-17 LAB — GLUCOSE, CAPILLARY
Glucose-Capillary: 103 mg/dL — ABNORMAL HIGH (ref 70–99)
Glucose-Capillary: 110 mg/dL — ABNORMAL HIGH (ref 70–99)
Glucose-Capillary: 135 mg/dL — ABNORMAL HIGH (ref 70–99)
Glucose-Capillary: 77 mg/dL (ref 70–99)
Glucose-Capillary: 96 mg/dL (ref 70–99)
Glucose-Capillary: 99 mg/dL (ref 70–99)

## 2020-07-17 LAB — BASIC METABOLIC PANEL
Anion gap: 9 (ref 5–15)
BUN: 28 mg/dL — ABNORMAL HIGH (ref 8–23)
CO2: 27 mmol/L (ref 22–32)
Calcium: 9.4 mg/dL (ref 8.9–10.3)
Chloride: 100 mmol/L (ref 98–111)
Creatinine, Ser: 1.45 mg/dL — ABNORMAL HIGH (ref 0.44–1.00)
GFR, Estimated: 40 mL/min — ABNORMAL LOW (ref >60)
Glucose, Bld: 134 mg/dL — ABNORMAL HIGH (ref 70–99)
Potassium: 4.5 mmol/L (ref 3.5–5.1)
Sodium: 136 mmol/L (ref 135–145)

## 2020-07-17 LAB — PREALBUMIN: Prealbumin: 27.4 mg/dL (ref 18–38)

## 2020-07-17 NOTE — Progress Notes (Signed)
[place] PHYSICAL MEDICINE & REHABILITATION PROGRESS NOTE  Subjective/Complaints: Pt lying in bed. Appears comfortable. Accidentally disconnected her TF last night requiring clean up.   ROS: limited due to language/communication    Objective: Vital Signs: Blood pressure 121/73, pulse 71, temperature (!) 97.5 F (36.4 C), temperature source Oral, resp. rate 18, height 5\' 2"  (1.575 m), weight 88.3 kg, last menstrual period 09/28/2012, SpO2 100 %. No results found. No results for input(s): WBC, HGB, HCT, PLT in the last 72 hours. Recent Labs    07/17/20 0518  NA 136  K 4.5  CL 100  CO2 27  GLUCOSE 134*  BUN 28*  CREATININE 1.45*  CALCIUM 9.4    Intake/Output Summary (Last 24 hours) at 07/17/2020 0938 Last data filed at 07/17/2020 0805 Gross per 24 hour  Intake 100 ml  Output --  Net 100 ml        Physical Exam: BP 121/73 (BP Location: Left Arm)   Pulse 71   Temp (!) 97.5 F (36.4 C) (Oral)   Resp 18   Ht 5\' 2"  (1.575 m)   Wt 88.3 kg   LMP 09/28/2012   SpO2 100%   BMI 35.60 kg/m   Constitutional: No distress . Vital signs reviewed. HEENT: EOMI, oral membranes moist Neck: supple Cardiovascular: RRR without murmur. No JVD    Respiratory/Chest: CTA Bilaterally without wheezes or rales. Normal effort    GI/Abdomen: BS +, non-tender, non-distended Ext: no clubbing, cyanosis, or edema Psych: flat but cooperative Skin: intact Neuro: Global aphasia- attempting to communicate thru words and short phrases Motor: Unable to follow commands consistently, spontaneously moving bilateral upper extremities:    Assessment/Plan: 1. Functional deficits which require 3+ hours per day of interdisciplinary therapy in a comprehensive inpatient rehab setting.  Physiatrist is providing close team supervision and 24 hour management of active medical problems listed below.  Physiatrist and rehab team continue to assess barriers to discharge/monitor patient progress toward  functional and medical goals   Care Tool:  Bathing    Body parts bathed by patient: Chest,Face,Front perineal area,Abdomen,Left arm,Right arm,Right upper leg,Left upper leg,Left lower leg,Right lower leg,Buttocks   Body parts bathed by helper: Buttocks,Right lower leg,Left lower leg     Bathing assist Assist Level: Supervision/Verbal cueing     Upper Body Dressing/Undressing Upper body dressing   What is the patient wearing?: Pull over shirt    Upper body assist Assist Level: Supervision/Verbal cueing    Lower Body Dressing/Undressing Lower body dressing      What is the patient wearing?: Pants,Incontinence brief     Lower body assist Assist for lower body dressing: Contact Guard/Touching assist     Toileting Toileting    Toileting assist Assist for toileting: Contact Guard/Touching assist     Transfers Chair/bed transfer  Transfers assist     Chair/bed transfer assist level: Contact Guard/Touching assist     Locomotion Ambulation   Ambulation assist      Assist level: Contact Guard/Touching assist Assistive device: Walker-rolling Max distance: 90   Walk 10 feet activity   Assist     Assist level: Contact Guard/Touching assist Assistive device: Walker-rolling   Walk 50 feet activity   Assist Walk 50 feet with 2 turns activity did not occur: Safety/medical concerns  Assist level: Contact Guard/Touching assist Assistive device: Walker-rolling    Walk 150 feet activity   Assist Walk 150 feet activity did not occur: Safety/medical concerns         Walk 10 feet  on uneven surface  activity   Assist Walk 10 feet on uneven surfaces activity did not occur: Safety/medical concerns         Wheelchair     Assist Will patient use wheelchair at discharge?: No             Wheelchair 50 feet with 2 turns activity    Assist            Wheelchair 150 feet activity     Assist           Medical Problem List  and Plan: 1.  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  Antithrombotics: -DVT/anticoagulation: Continue Lovenox             -antiplatelet therapy: Aspirin 81 mg daily and Brilinta 90 mg twice daily 3. Pain Management:  Reports headaches at times- continue Tylenol prn 4. Mood: Provide emotional support             -antipsychotic agents: N/A 5. Neuropsych: This patient is not capable of making decisions on her own behalf. 6. Skin/Wound Care: Routine skin checks 7. Fluids/Electrolytes/Nutrition: Routine and outs            CMP 2/7 reviewed and electrolytes are stable.  8.  Poststroke dysphagia:.  Dysphagia #2 nectar liquids. Dietary follow-up             Advance diet as tolerated  TF spread over longer time period to minimize nausea/emesis which has helped  2/12 -reduced TF to just evenings to help stimulate po intake during day   -adjusted H2O boluses  2/13 ate 15% 50% and 50% yesterday 9.  Seizure prophylaxis.  Keppra 500 mg twice daily.  EEG negative. D/c Keppra. Continues to be seizure free.   10.  Urinary retention: Continue Urecholine 10 mg 3 times daily.  Check PVR 11.  Diet-controlled diabetes mellitus.  Hemoglobin A1c 5.4.  CBGs discontinued 12.  Hyperlipidemia: Lipitor 13.  Morbid obesity status post gastric sleeve 05/24/2020.  Dietary follow-up 14.  CKD stage III.  Baseline creatinine 1.41-1.59             Cr 1.33 on 2/7 15. Abdominal pain, nausea, emesis:   2/9: stop TF, repeat KUB, zofran ordered, RD consult placed.   2/10: sx better with reduction in TF time/rate  2/11-13: improving, continue small meals, zofran PRN   -TF changed to HS     LOS: 8 days A FACE TO FACE EVALUATION WAS PERFORMED  Meredith Staggers 07/17/2020, 9:38 AM

## 2020-07-17 NOTE — Progress Notes (Signed)
Patient found by NT twisted in bed with TF disconnected, soiling her linen and clothing, readjusted patient, provided ADL and the assisted to BR via wheelchair and back to bed. CBG reading 77, no apparent discomfort or distress noted , patient nods with some response to questions unsure of understanding. Made as comfortable as possible, repositioned, and monitor closely, HOB elevated 30 degree, call bell placed within reach, bed alarm on

## 2020-07-17 NOTE — Plan of Care (Signed)
  Problem: Consults Goal: RH STROKE PATIENT EDUCATION Description: See Patient Education module for education specifics  Outcome: Progressing   Problem: RH BOWEL ELIMINATION Goal: RH STG MANAGE BOWEL WITH ASSISTANCE Description: STG Manage Bowel with Mod I Assistance. Outcome: Progressing Goal: RH STG MANAGE BOWEL W/MEDICATION W/ASSISTANCE Description: STG Manage Bowel with Medication with Mod I Assistance. Outcome: Progressing   Problem: RH BLADDER ELIMINATION Goal: RH STG MANAGE BLADDER WITH ASSISTANCE Description: STG Manage Bladder With Mod I Assistance Outcome: Progressing Goal: RH STG MANAGE BLADDER WITH MEDICATION WITH ASSISTANCE Description: STG Manage Bladder With Medication With Mod I Assistance. Outcome: Progressing   Problem: RH SAFETY Goal: RH STG ADHERE TO SAFETY PRECAUTIONS W/ASSISTANCE/DEVICE Description: STG Adhere to Safety Precautions With Mod I Assistance/Device. Outcome: Progressing Goal: RH STG DECREASED RISK OF FALL WITH ASSISTANCE Description: STG Decreased Risk of Fall With Mod I Assistance. Outcome: Progressing   Problem: RH COGNITION-NURSING Goal: RH STG USES MEMORY AIDS/STRATEGIES W/ASSIST TO PROBLEM SOLVE Description: STG Uses Memory Aids/Strategies With Mod I Assistance to Problem Solve. Outcome: Progressing Goal: RH STG ANTICIPATES NEEDS/CALLS FOR ASSIST W/ASSIST/CUES Description: STG Anticipates Needs/Calls for Assist With Mod I Assistance/Cues. Outcome: Progressing   Problem: RH PAIN MANAGEMENT Goal: RH STG PAIN MANAGED AT OR BELOW PT'S PAIN GOAL Description: Assess and treat pain q shift and as needed Outcome: Progressing

## 2020-07-18 LAB — GLUCOSE, CAPILLARY
Glucose-Capillary: 100 mg/dL — ABNORMAL HIGH (ref 70–99)
Glucose-Capillary: 108 mg/dL — ABNORMAL HIGH (ref 70–99)
Glucose-Capillary: 121 mg/dL — ABNORMAL HIGH (ref 70–99)
Glucose-Capillary: 127 mg/dL — ABNORMAL HIGH (ref 70–99)
Glucose-Capillary: 128 mg/dL — ABNORMAL HIGH (ref 70–99)
Glucose-Capillary: 137 mg/dL — ABNORMAL HIGH (ref 70–99)

## 2020-07-18 MED ORDER — OSMOLITE 1.2 CAL PO LIQD
540.0000 mL | ORAL | Status: DC
  Administered 2020-07-18 – 2020-07-19 (×2): 540 mL

## 2020-07-18 NOTE — Progress Notes (Signed)
Speech Language Pathology Weekly Progress and Session Note  Patient Details  Name: Courtney Burke MRN: [id number] Date of Birth: September 23, 1955  Beginning of progress report period: July 10, 2020 End of progress report period: July 18, 2020  Today's Date: 07/18/2020 SLP Individual Time: 9509-3267 SLP Individual Time Calculation (min): 42 min  Short Term Goals: Week 1: SLP Short Term Goal 1 (Week 1): Pt will vocalize on command in 50% of opportunities with mod assist multimodal cues. SLP Short Term Goal 1 - Progress (Week 1): Met SLP Short Term Goal 2 (Week 1): Pt will replicate non speech oral movements in 25% of opportunities with max assist multimodal cues. SLP Short Term Goal 2 - Progress (Week 1): Met SLP Short Term Goal 3 (Week 1): Pt will match picture to word from a field of two for 50% accuracy with max assist multimodal cues. SLP Short Term Goal 3 - Progress (Week 1): Met SLP Short Term Goal 4 (Week 1): Pt will consume dys 2 textures and nectar thick liquids with min assist cues for use of swallowing precautions and minimal overt s/s of aspiration. SLP Short Term Goal 4 - Progress (Week 1): Met SLP Short Term Goal 5 (Week 1): Pt will consume therapeutic trials of thin liquids with min assist cues for use of swallowing precautions and minimal overt s/s of aspiration. SLP Short Term Goal 5 - Progress (Week 1): Met    New Short Term Goals: Week 2: SLP Short Term Goal 1 (Week 2): STG=LTG due to remaining length of stay  Weekly Progress Updates: Pt has made functional gains and met 5 out of 5 short term goals this reporting period. Pt is currently Max-Total assist for functional communication due to severe global aphasia. Although her expressive deficits are more severe in nature in comparison to receptive, her receptive deficits are still significant and impact her ability to use an alternative communication device such as a picture and/or word communication board at this time. She  has demonstrated improvements in ability to repeat some words, vocalize on command, perform some oral motor commands and CV CVC word combinations. She benefits from both phonemic models accompanied by orthographic cues for accurate verbal productions, although her association between meaning and verbalizations is still limited. She is intermittently aware of her verbal errors. Pt is consuming a Dys 2 texture (minced/ground) diet with nectar liquids. She is also on the water protocol and has been exhibiting minimal overt s/sx aspiration during consumption of thin over the last week. Her overall solid PO intake is limited, and minimal Dys 3 (mechanical soft) texture trials have been targeted, due to her limited participation at times (with solid PO intake only). Pt will participate in a repeat MBSS tomorrow (07/19/20) in order to reassess oropharyngeal swallow function and any potential for advancements to diet. Pt education is ongoing, although due to receptive deficits, it is unlikely pt is able to carryover this information. No family has been present for ST sessions to date, and would recommend thorough ST education with family prior to d/c for pt's greatest safety and to maximize her functional communication with family members. Pt would continue to benefit from skilled ST while inpatient in order to maximize functional independence and reduce burden of care prior to discharge. Anticipate that pt will need 24/7 supervision at discharge in addition to [place] follow up at next level of care.      Intensity: Minumum of 1-2 x/day, 30 to 90 minutes Frequency: 3 to 5 out of 7  days Duration/Length of Stay: 07/25/20 Treatment/Interventions: Cueing hierarchy;Functional tasks;Environmental controls;Dysphagia/aspiration precaution training;Internal/external aids;Speech/Language facilitation;Multimodal communication approach;Patient/family education   Daily Session  Skilled Therapeutic Interventions: Pt was seen for  skilled ST targeting dysphagia and communication goals. SLP facilitated session with upgraded trial of thin H2O after oral care and in accordance with [place] Water Protocol. Pt demonstrated 1 delayed cough at end of intake of ~6oz of thin H2O. She required Min A verbal cues from clinician to utilize slow rate and 1-2 sips at a time. Pt also consumed a small portion of a Dys 3 (mechcanical soft) solid snack, during which she demonstrated functional mastication and oral clearance of POs. She is making good progress toward advancement of both solids, and SLP would recommend proceeding with repeat MBS tomorrow at 0900 in order to instrumentally reassess oral and pharyngeal swallowing functioning/potential for advancements. Continue current diet for now. Communication interventions focused on expressive and receptive language skills. Automatic speech tasks provided to increase pt's accuracy in verbal output. When provided orthographic cues, she could count from 1-2, and read days of the week and months of the year with 100% accuracy. With only phonemic cues, she was 0% accurate with counting, could repeat 5/7 days of the week, and produced Jan-April for months of the year. When attempting a strictly word based communication board/page, pt still demonstrates significant receptive deficits, as she is unable to point to words to identify or convey meaning (ex: which word would you say/point to if you wanted something to eat). Therefore, picture and/or word based communication board are still not functional at this time. Pt left laying in bed with alarm set and needs within reach. Continue per current plan of care.       Pain Pain Assessment Pain Scale: 0-10 Pain Score: 0-No pain  Therapy/Group: Individual Therapy  Arbutus Leas 07/18/2020, 7:30 AM

## 2020-07-18 NOTE — Progress Notes (Signed)
Occupational Therapy Session Note  Patient Details  Name: Courtney Burke MRN: [id number] Date of Birth: [birth year]  Today's Date: 07/18/2020 OT Individual Time: 1415-1530 OT Individual Time Calculation (min): 75 min    Short Term Goals: Week 1:  OT Short Term Goal 1 (Week 1): Pt will complete toileting with Min A OT Short Term Goal 2 (Week 1): Pt will don overhead shirt with CGA OT Short Term Goal 3 (Week 1): Pt will complete oral care with Min A and no more than min cues for sequencing  Skilled Therapeutic Interventions/Progress Updates:    Patient in bed, alert and cooperative.  She continues with expressive impairment but able to indicate what she wants/needs through pointing and gestures.  Y/N responses mostly accurate and occ clear phrases spoken.  She denies pain and indicates that she does not need to use the bathroom but would like to change her clothing.  Supine to sitting edge of bed with CS.  SPT and short distance ambulation with RW CS/CGA.  She is able to pick out clothing with set up.  Doffs clothing with CS and donns [place] dress with CS.  Oral care completed with CS.  To therapy gym via w/c.  Completed sequencing of numbers and letters both seated and in stance with BITS system - initially she required max verbal and pointing cues to sequence numbers 1-10 on screen - she requested to practice and after a number of repetitions she was able to complete with CS.  Attempted letters A-J mod cues for one trial.  Completed right arm coordination activity with good results, reaction time assessment in stance using both arms - 1.76sec with increased time on right side.   Completed motor control and balance activities in unsupported sitting on mat table with CS.  Design copy with pipe tree - she was able to manipulate pieces but required max cues.  Attempted matching with dominoes but she was unable and became frustrated.  Ambulation on unit with RW approx 50 feet CGA.  She returned to bed at close  of session - she became tearful upon attempt to relay information about her family.  She was consolable but sad at close of session, bed alarm set and call bell in hand.    Therapy Documentation Precautions:  Precautions Precautions: Fall Precaution Comments: globally aphasic, NG tube Restrictions Weight Bearing Restrictions: No  Therapy/Group: Individual Therapy  Carlos Levering 07/18/2020, 7:43 AM

## 2020-07-18 NOTE — Progress Notes (Signed)
[facility] PHYSICAL MEDICINE & REHABILITATION PROGRESS NOTE  Subjective/Complaints:  Severe receptive and exp language def   ROS: limited due to language/communication    Objective: Vital Signs: Blood pressure (!) 146/69, pulse 73, temperature 98.3 F (36.8 C), resp. rate 18, height 5\' 2"  (1.575 m), weight 88.6 kg, last menstrual period 09/28/2012, SpO2 90 %. No results found. No results for input(s): WBC, HGB, HCT, PLT in the last 72 hours. Recent Labs    07/17/20 0518  NA 136  K 4.5  CL 100  CO2 27  GLUCOSE 134*  BUN 28*  CREATININE 1.45*  CALCIUM 9.4    Intake/Output Summary (Last 24 hours) at 07/18/2020 5462 Last data filed at 07/18/2020 0756 Gross per 24 hour  Intake 298 ml  Output --  Net 298 ml        Physical Exam: BP (!) 146/69 (BP Location: Left Arm)   Pulse 73   Temp 98.3 F (36.8 C)   Resp 18   Ht 5\' 2"  (1.575 m)   Wt 88.6 kg   LMP 09/28/2012   SpO2 90%   BMI 35.73 kg/m    General: No acute distress Mood and affect are appropriate Heart: Regular rate and rhythm no rubs murmurs or extra sounds Lungs: Clear to auscultation, breathing unlabored, no rales or wheezes Abdomen: Positive bowel sounds, soft nontender to palpation, nondistended Extremities: No clubbing, cyanosis, or edema Skin: No evidence of breakdown, no evidence of rash  Neuro: Global aphasia- attempting to communicate thru words and short phrases Motor: Unable to follow commands consistently, cannot perform formal MMT but is antigravity with resistance in all 4 ext    Assessment/Plan: 1. Functional deficits which require 3+ hours per day of interdisciplinary therapy in a comprehensive inpatient rehab setting.  Physiatrist is providing close team supervision and 24 hour management of active medical problems listed below.  Physiatrist and rehab team continue to assess barriers to discharge/monitor patient progress toward functional and medical goals   Care Tool:  Bathing     Body parts bathed by patient: Chest,Face,Front perineal area,Abdomen,Left arm,Right arm,Right upper leg,Left upper leg,Left lower leg,Right lower leg,Buttocks   Body parts bathed by helper: Buttocks,Right lower leg,Left lower leg     Bathing assist Assist Level: Supervision/Verbal cueing     Upper Body Dressing/Undressing Upper body dressing   What is the patient wearing?: Pull over shirt    Upper body assist Assist Level: Supervision/Verbal cueing    Lower Body Dressing/Undressing Lower body dressing      What is the patient wearing?: Pants,Incontinence brief     Lower body assist Assist for lower body dressing: Contact Guard/Touching assist     Toileting Toileting    Toileting assist Assist for toileting: Contact Guard/Touching assist     Transfers Chair/bed transfer  Transfers assist     Chair/bed transfer assist level: Contact Guard/Touching assist     Locomotion Ambulation   Ambulation assist      Assist level: Contact Guard/Touching assist Assistive device: Walker-rolling Max distance: 90   Walk 10 feet activity   Assist     Assist level: Contact Guard/Touching assist Assistive device: Walker-rolling   Walk 50 feet activity   Assist Walk 50 feet with 2 turns activity did not occur: Safety/medical concerns  Assist level: Contact Guard/Touching assist Assistive device: Walker-rolling    Walk 150 feet activity   Assist Walk 150 feet activity did not occur: Safety/medical concerns         Walk 10 feet  on uneven surface  activity   Assist Walk 10 feet on uneven surfaces activity did not occur: Safety/medical concerns         Wheelchair     Assist Will patient use wheelchair at discharge?: No             Wheelchair 50 feet with 2 turns activity    Assist            Wheelchair 150 feet activity     Assist           Medical Problem List and Plan: 1.  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  Antithrombotics: -DVT/anticoagulation: Continue Lovenox             -antiplatelet therapy: Aspirin 81 mg daily and Brilinta 90 mg twice daily 3. Pain Management:  Reports headaches at times- continue Tylenol prn 4. Mood: Provide emotional support             -antipsychotic agents: N/A 5. Neuropsych: This patient is not capable of making decisions on her own behalf. 6. Skin/Wound Care: Routine skin checks 7. Fluids/Electrolytes/Nutrition: Routine and outs            CMP 2/7 reviewed and electrolytes are stable.  8.  Poststroke dysphagia:.  Dysphagia #2 nectar liquids. Dietary follow-up             Advance diet as tolerated  TF spread over longer time period to minimize nausea/emesis which has helped  2/12 -reduced TF to just evenings to help stimulate po intake during day   -adjusted H2O boluses  Ate 60% of last 3 meals - may be ready for discontinuation of feeding tube if this remains consistent  9.  Seizure prophylaxis.  Keppra 500 mg twice daily.  EEG negative. D/c Keppra. Continues to be seizure free.   10.  Urinary retention: Continue Urecholine 10 mg 3 times daily.  Check PVR 11.  Diet-controlled diabetes mellitus.  Hemoglobin A1c 5.4.  CBGs discontinued 12.  Hyperlipidemia: Lipitor 13.  Morbid obesity status post gastric sleeve 05/24/2020.  Dietary follow-up 14.  CKD stage III.  Baseline creatinine 1.41-1.59             Cr 1.33 on 2/7 15. Abdominal pain, nausea, emesis:   2/9: stop TF, repeat KUB, zofran ordered, RD consult placed.   2/10: sx better with reduction in TF time/rate  2/11-13: improving, continue small meals, zofran PRN   -TF changed to HS     LOS: 9 days A FACE TO FACE EVALUATION WAS PERFORMED  [name] 07/18/2020, 9:27 AM

## 2020-07-18 NOTE — Progress Notes (Signed)
Nutrition Follow-up  DOCUMENTATION CODES:   Obesity unspecified  INTERVENTION:   Continue nocturnal tube feeds via Cortrak: - Osmolite 1.2 @ 45 ml/hr x 12 hours from 1800 to 0600 (total of 540 ml) - ProSource TF 45 ml BID - Free water flushes of 150 ml q 6 hours  Tube feeding regimen provides 728 kcal, 52 grams of protein, and 443 ml of H2O (meets 46% of kcal needs and 58% of protein needs).  Total free water with flushes: 1043 ml  - Continue Ensure Enlive po BID, each supplement provides 350 kcal and 20 grams of protein, thickened to nectar-thick consistency  - Magic Cup TID with meals, each supplement provides 290 kcal and 9 grams of protein  - Continue MVI with minerals BID  - Continue calcium citrate TID  NUTRITION DIAGNOSIS:   Inadequate oral intake related to dysphagia,decreased appetite as evidenced by meal completion < 50%.  Progressing  GOAL:   Patient will meet greater than or equal to 90% of their needs  Progressing  MONITOR:   PO intake,Supplement acceptance,Diet advancement,Labs,Weight trends,TF tolerance,I & O's  REASON FOR ASSESSMENT:   Consult Enteral/tube feeding initiation and management  ASSESSMENT:   65 year old female with PMHx of HTN, DM, CKD stage III, vitamin D deficiency, obesity s/p gastric sleeve resection 12/21 admitted 1/27 with L MCA s/p revascularization of occluded L MCA with stent. Admitted to CIR 2/5.  Cortrak tube remains in place with nocturnal tube feeds ordered. Per MD note, pt may be ready for discontinuation of feeding tube if PO intake remains consistent.  Weight stable since admission to CIR.  Spoke with pt briefly at bedside. Pt seems to express that she would like to have Cortrak removed. She denies nausea at this time. Will continue with nocturnal tube feeds via Cortrak.  Meal Completion: 15-60%  Medications reviewed and include: calcium citrate TID, cholecalciferol, colace, Ensure Enlive BID, MVI with minerals  BID, protonix, miralax, senna  Labs reviewed: BUN 28, creatinine 1.45 CBG's: 96-137 x 24 hours  Diet Order:   Diet Order            DIET DYS 2 Room service appropriate? No; Fluid consistency: Nectar Thick  Diet effective now                 EDUCATION NEEDS:   No education needs have been identified at this time  Skin:  Skin Assessment: Reviewed RN Assessment  Last BM:  07/18/20 large type 6  Height:   Ht Readings from Last 1 Encounters:  07/09/20 5\' 2"  (1.575 m)    Weight:   Wt Readings from Last 1 Encounters:  07/18/20 88.6 kg    BMI:  Body mass index is 35.73 kg/m.  Estimated Nutritional Needs:   Kcal:  1600-1800  Protein:  90-110 grams  Fluid:  >/= 1.6 L/day    Gustavus Bryant, MS, RD, LDN Inpatient Clinical Dietitian Please see [facility] for contact information.

## 2020-07-18 NOTE — Progress Notes (Signed)
Physical Therapy Weekly Progress Note  Patient Details  Name: Courtney Burke MRN: [id number] Date of Birth: [birth year]  Beginning of progress report period: July 10, 2020 End of progress report period: July 18, 2020  Today's Date: 07/18/2020 PT Individual Time: 0800-0900 PT Individual Time Calculation (min): 60 min   Patient has met 2 of 4 short term goals. All STG have been upgraded or revised d/t progress from increases in strength, balance, activity tolerance and some improvements in receptive aphasia.   Patient continues to demonstrate the following deficits muscle weakness, decreased cardiorespiratoy endurance, impaired timing and sequencing, unbalanced muscle activation and decreased coordination, decreased attention to right, decreased awareness, decreased problem solving, decreased safety awareness and decreased memory and decreased standing balance and decreased balance strategies and therefore will continue to benefit from skilled PT intervention to increase functional independence with mobility.  Patient progressing toward long term goals..  Continue plan of care.  PT Short Term Goals Week 1:  PT Short Term Goal 1 (Week 1): Pt will increase bed mobility to S. PT Short Term Goal 1 - Progress (Week 1): Met PT Short Term Goal 2 (Week 1): Pt will increase transfers to c/g. PT Short Term Goal 2 - Progress (Week 1): Updated due to goal met PT Short Term Goal 3 (Week 1): Pt will increase ambulation with LRAD about 100 feet with c/g PT Short Term Goal 3 - Progress (Week 1): Partly met PT Short Term Goal 4 (Week 1): Pt will ascend/descend 4 stairs with B rails and c/g.. PT Short Term Goal 4 - Progress (Week 1): Partly met Week 2:  PT Short Term Goal 1 (Week 2): Pt will perform all functional transfers at supervision assist level. PT Short Term Goal 2 (Week 2): Pt will ambulate with LRAD at least 150 with supervision and improved RLE step-through pattern. PT Short Term Goal 3 (Week  2): Pt will ascend/descend 4 stairs with B rails and supervision.  Skilled Therapeutic Interventions/Progress Updates:    Patient supine in bed upon PT arrival. Patient alert and agreeable to PT session. Patient denied pain during session. Request to RN for discontinue of continuous feed through n/g tube for therapy session.  Therapeutic Activity: Bed Mobility: Pt requires add'l physical assist this day to complete supine--> sit Min A to reach seated position on EOB. Indicates dizziness on sitting that dissipates with time in sitting. At end of session, pt returns to supine with supervision and vc for final positioning.  Transfers: Patient performed STS, and SPVT transfers with supervision throughout session with supervision from various surfaces and heights. Very first STS performed with immediate return to sit by pt as her weight did not come forward over her feet fast enough. Pt is able to correct performance in second and subsequent attempts. provided verbal cues for technique in weight shifting and Bil feet placement.  Toilet transfer performed with pt requiring slight increase in physical assist d/t imbalance noted to R side. Pt able to self correct with stepping strategy and no LOB. Pt noted to be impulsive during dressing and attempting to initiate UB dressing prior to disconntect of n/g tube feed despite several prior explanations that she would have to wait to pull sweatshirt over head.  NMR: Pt guided in continuous reciprocation using NuStep L 2 x 53mn, L3 x3856m and L2 x 56m56mwith focus to increase RLE muscle activation in reciprocal movement coordination with LLE. NMR facilitated with RLE toe touches to 4in step in order to produce increased step  height and possible carryover into improved quality of ambulation.   Gait Training:  Patient ambulated 155' x1/ 140' x1/ 132' x1 as well as short distances in room using RW with CGA. Ambulated with step-to pattern on RLE. Provided verbal cues to  correct, however pt is unable to return demo throughout session.  Patient supine in bed at end of session with brakes locked, bed alarm set, and all needs within reach.   Therapy Documentation Precautions:  Precautions Precautions: Fall Precaution Comments: globally aphasic, NG tube Restrictions Weight Bearing Restrictions: No   Therapy/Group: Individual Therapy  Alger Simons 07/18/2020, 3:30 PM

## 2020-07-19 ENCOUNTER — Inpatient Hospital Stay (HOSPITAL_COMMUNITY): Payer: Medicare Other

## 2020-07-19 LAB — GLUCOSE, CAPILLARY
Glucose-Capillary: 101 mg/dL — ABNORMAL HIGH (ref 70–99)
Glucose-Capillary: 104 mg/dL — ABNORMAL HIGH (ref 70–99)
Glucose-Capillary: 120 mg/dL — ABNORMAL HIGH (ref 70–99)
Glucose-Capillary: 135 mg/dL — ABNORMAL HIGH (ref 70–99)
Glucose-Capillary: 141 mg/dL — ABNORMAL HIGH (ref 70–99)
Glucose-Capillary: 170 mg/dL — ABNORMAL HIGH (ref 70–99)

## 2020-07-19 NOTE — Progress Notes (Signed)
Occupational Therapy Weekly Progress Note  Patient Details  Name: Courtney Burke MRN: [id number] Date of Birth: 1955-06-08  Beginning of progress report period: July 10, 2020 End of progress report period: July 19, 2020  Today's Date: 07/19/2020 OT Individual Time: 2542-7062 OT Individual Time Calculation (min): 59 min and 26 min   Patient has met 3 of 3 short term goals. Pt has made significant progress with OT this reporting period. Pt has demonstrated improved activity tolerance, dynamic sitting/standing balance, error awareness, problem solving, and safety awareness. Pt is currently performing UB ADLs/LB ADLs with close with use of RW and performing toilet/shower transfers with close S to CGA with use of RW. Still req min VCs for problem solving and initiation.   Patient continues to demonstrate the following deficits: muscle weakness, decreased cardiorespiratoy endurance, decreased coordination and decreased motor planning and decreased problem solving and decreased safety awareness and therefore will continue to benefit from skilled OT intervention to enhance overall performance with BADL and Reduce care partner burden.  Patient progressing toward long term goals..  Continue plan of care.  OT Short Term Goals Week 2:  OT Short Term Goal 1 (Week 2): STG = LTG 2/2 ELOS  Skilled Therapeutic Interventions/Progress Updates:     Session 1 ([phone]): Pt received semi-reclined in bed, agreeable to therapy. No s/sx of pain this session. Session focus on toileting, showering, dressing, standing endurance, and problem solving/error awareness in prep for improved ADL performance. Supine > sitting EOB with close S. STS and amb to toilet to complete toilet transfer with RW + close S. Cont void of b/b, close S for standing peri-care and clothing management. TTB transfer with RW + close S + use of front grab bar. Bathed full-body with overall min A to bathe buttocks. Doffed/donned shirt, brief,  pants, B shoes with close S. Donned TEDs with total A. Standing at high-low table, completed visual perceptual task with overall mod A progressing to min A VCs for error awareness/problem solving with task. Stood for 3 min first bout, then 1 min before initiating seated rest break. Ambulatory transfer back to bed same manner as above. Sitting EOB > supine with close S. Pt left with HOB> 30 degrees with bed alarm engaged, call bell in reach, and all immediate needs met.   Session 2 [phone]): Pt received sitting up in bed with RN present, finishing up lunch. Pt denies pain. Diet upgraded per RN, pt agreeable to finish lunch. Supine > sitting EOB with close S + use of bed rail. Session focus on self-feeding sitting unsupported using RUE. Pt self-fed ~5 bites and drank from cup 3x with RUE with close S, but req VCs to initiate each bite. Pt declining further food at this time. Amb to sink to brush teeth with RW with close S, stood to brush teeth but gesturing towards w/c to initiate seated rest break after ~1 min . Stand-pivot back to bed with hand-held assist and min A for balance. Doffed/donned shirt and doffed pants/B shoes with close S. Sitting EOB > supine with close S. Pt left with HOB >30 with bed alarm engaged, call bell in reach, and all immediate needs met.    Therapy Documentation Precautions:  Precautions Precautions: Fall Precaution Comments: globally aphasic, NG tube Restrictions Weight Bearing Restrictions: No Pain: Pain Assessment Pain Scale: Faces Pain Score: 0-No pain Faces Pain Scale: No hurt ADL: See Care Tool for more details.  Therapy/Group: Individual Therapy  Volanda Napoleon MS, OTR/L  07/19/2020, 2:40 PM

## 2020-07-19 NOTE — Progress Notes (Signed)
Modified Barium Swallow Progress Note  Patient Details  Name: Courtney Burke MRN: [id number] Date of Birth: August 10, 1955  Today's Date: 07/19/2020  Modified Barium Swallow completed.  Full report located under Chart Review in the Imaging Section.  Brief recommendations include the following:  Clinical Impression  Pt presents with mild oropharyngeal dysphagia, but excellent functional improvements since last MBSS performed 07/04/20. Although she took very large consecutive sips despite cues to slow rate and use single sips, no instances of aspiration of thin barium was observed throughout multiple trials. Only intermittent flash penetration of thin barium observed (and mostly at the start of the study). However, 100% of penetrates remained far above the level of the vocal folds and were fully ejected from the vestibule during the swallow. Consumption via cup or straw did not impact her ability to protect her airway. Her swallow initiation was timely in 95% of instances, and even when swallow initiated at pyriform sinuses X1, laryngeal vestibule closure was effective to prevent intrusion. Pt exhibited functional mastication of dysphagia 3 (mechanical soft) solids, although piecemeal swallowing noted. She independently used extra swallows to clear min oral residue. When attempting to swallow barium pill with thin, pt unable to follow oral motor command to swallow as 1 cohesive bolus - instead she chewed the pill. Given results of today's study, recommend pt upgrade to dysphagia 3 (mechanical soft) textures, thin liquids, medications should be crushed in applesauce or pudding. Pt should still have full supervision during PO intake. Encourage pt to take small, slow sips of drinks and check for oral residue/pocketing, cue pt to use liquid washes to assist with full oral clearance. ST will continue to provide skilled interventions to ensure diet safety and efficiency and facilitate greater carryover of compensatory  swallow strategies.  Swallow Evaluation Recommendations      SLP Diet Recommendations: Dysphagia 3 (Mech soft) solids;Thin liquid   Liquid Administration via: Cup;Straw   Medication Administration: Crushed with puree   Supervision: Patient able to self feed;Full supervision/cueing for compensatory strategies   Compensations: Slow rate;Small sips/bites;Minimize environmental distractions;Follow solids with liquid   Postural Changes: Remain semi-upright after after feeds/meals (Comment);Seated upright at 90 degrees   Oral Care Recommendations: Oral care BID        Arbutus Leas 07/19/2020,9:46 AM

## 2020-07-19 NOTE — Consult Note (Signed)
Neuropsychological Consultation   Patient:   Courtney Burke   DOB:   [birth year]  MR Number:  [id number]  Location:  [facility] [id number] [place] [address] Dept: [place]: [phone]           Date of Service:   07/19/2020  Start Time:   1 PM End Time:   2 PM  Provider/Observer:  Ilean Skill, Psy.D.       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  Patient presented on 06/30/2020 to the emergency department via EMS as a code stroke.  ED records show that the family was last seen in the normal state around 9 PM the night before.  Around 6 AM the family found her standing outside in the cold wearing nothing but a thin night shirt and not responding to them.  They assisted her back into the house where she was noted to be weak on right side [facility] reports gaze deviation and right-sided weakness with no prior history of seizure.  Head CT showed acute left MCA territory infarct.  No hemorrhage or mass-effect noted.  CTA showed left MCA branch reconstitution with positive finding for emergent large vessel occlusion.  Patient underwent MCA stenting per IR.  Follow-up MRI showed acute infarct left MCA territory involving the basal ganglia as well as the left temporal, frontal and parietal lobes.  Small amount of hemorrhagic process in the left posterior temporal lobe noted.  There were additional small areas of acute infarct in the occipital white matter bilaterally and right cerebellum.  Reason for Service:  Patient was referred for neuropsychological consultation due to adjustment issues including symptoms of anxiety in the setting of significant  but not complete aphasia and significant residual cognitive effects especially executive functioning and problem-solving.  Below is the HPI for the current admission.  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  History taken from chart review due to cognition.  She presented on 06/30/2020 with AMS and aphasia.  Head CT showing acute left MCA territory infarct.  No hemorrhage or mass-effect.  Patient did not receive TPA.  EEG negative for seizures.  CT angiogram of head and neck positive for emergent large vessel occlusion.  Left MCA origin.  Some left MCA branch reconstitution underwent MCA stenting per interventional radiology.  Follow-up MRI showed acute infarct left MCA territory involving the basal ganglia as well as left temporal frontal parietal lobes.  Small amount of hemorrhage in the left posterior temporal lobe.  Additional small areas of acute infarct in the occipital white matter bilaterally and right cerebellum.  Echocardiogram with ejection fraction of 60-65%.  No wall motion abnormalities grade 1 diastolic dysfunction.  Currently maintained on aspirin 81 mg daily as well as Brilinta for CVA prophylaxis.  Patient would follow-up outpatient with cardiology services to discuss loop recorder placement.  Subcutaneous Lovenox for DVT prophylaxis.  Currently maintained on Keppra for seizure prophylaxis.  Hospital course further complicated by post stroke dysphagia.  Currently on dysphagia #2 nectar thick liquid diet as well as alternative means of nutritional support.  Bouts of  urinary retention placed on Urecholine.  Due to patient's decreased functional mobility and global aphasia she was admitted for a comprehensive rehab program.  Please see preadmission assessment from earlier today as well.  Current Status:  The patient was laying in her bed  slightly elevated when I entered the room.  Nursing had been in the room due to the patient is describing GI pain following bariatric swallow test.  Patient was waiting on lunch.  The patient was awake and alert but with significant expressive aphasia.  While she was able to say words and accurately describe positive versus negative responses grammar was severely impaired as well as circumlocutions and word substitutions as well as other paraphasic errors.  The patient denied significant depression or significant emotional distress although she has acknowledged issues of anxiety and difficulty coping.  The patient's right parietal frontal regions being involved are likely creating some significant issues around problem solving and maintaining linear thinking around processing overall gestalt of situations.  The patient is confused and has difficulty with broader understanding of situations.  While the patient reports that she has been told what happened to her she was unable to verbalize specific details are described difficulties that she was having.  Patient is aware that her expressive language is impaired and that she is not able to communicate effectively as she had prior to this event.  Patient has been making good progress with her dysphagia over the past several days.  Patient still having difficulties following oral commands to swallow on cue.  The patient has been effectively communicating wants and needs and responded to yes no questions accurately.  Patient still needing max verbal and visual cues to sequence information and significant need for assistance and balance and other motor functions.  Behavioral Observation: Courtney Burke  presents as a 65 y.o.-year-old Right handed African American Female who appeared her stated age. her dress was Appropriate and she was Well Groomed and her manners were Appropriate to the situation.  her participation was indicative of Appropriate, Inattentive and  Redirectable behaviors.  There were physical disabilities noted.  she displayed an appropriate level of cooperation and motivation.     Interactions:    Active Inattentive and Redirectable  Attention:   abnormal and attention span appeared shorter than expected for age  Memory:   abnormal; but difficult to fully assess due to expressive language deficits.  Visuo-spatial:  While not objectively assessed given the patient's specific left MCA having effects on the left parietal lobe as well as occipital and cerebellar involvement it is likely that the patient does have some residual visual spatial and visual constructional deficits.  Speech (Volume):  low  Speech:   non-fluent aphasia;   Thought Process:  Circumstantial  Though Content:  WNL; not suicidal and not homicidal  Orientation:   person and place  Judgment:   Poor  Planning:   Poor  Affect:    Anxious  Mood:    Anxious  Insight:   Shallow  Intelligence:   normal  Medical History:   Past Medical History:  Diagnosis Date  . Asthma   . Borderline diabetes   . Hypertension          Patient Active Problem List   Diagnosis Date Noted  . 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 Acute ischemic stroke ([place])   . Cerebrovascular accident (CVA) ([place])   . Dyslipidemia   . Stage 3b chronic kidney disease ([place])   . Dysphagia, post-stroke   .  Diabetes mellitus type 2 in obese ([place])   . Acute ischemic left MCA stroke ([place]) 06/30/2020  . Acute stroke due to occlusion of left middle cerebral artery ([place]) 06/30/2020  . Gallstones 08/18/2019  . Esophagitis 07/20/2019  . Erosive gastritis 07/20/2019  . Chronic bilateral thoracic back pain 06/05/2018  . Arthralgia of multiple joints 09/28/2015  . Age-related nuclear cataract of both eyes 09/28/2015  . Asthma 09/28/2015  . Essential hypertension 09/28/2015  . Essential thrombocytosis ([place]) 09/28/2015  . Gastroesophageal reflux disease without esophagitis  09/28/2015  . Keratoconjunctivitis sicca of both eyes not specified as Sjogren's 09/28/2015  . Status post gastric bypass for obesity 09/28/2015  . Chronic renal insufficiency, stage 1 08/11/2014  . 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 Morbid obesity ([place]) 05/21/2013  . Hyperlipidemia, unspecified 03/16/2013   Psychiatric History:  No prior psychiatric history  Family Med/Psych History: History reviewed. No pertinent family history.   Impression/DX:  Courtney Burke is a 65 year old female with history of hypertension and diet-controlled diabetes as well as chronic kidney disease, asthma, vitamin D deficiency, obesity status post gastric sleeve 05/24/2020.  Patient presented on 06/30/2020 to the emergency department via EMS as a code stroke.  ED records show that the family was last seen in the normal state around 9 PM the night before.  Around 6 AM the family found her standing outside in the cold wearing nothing but a thin night shirt and not responding to them.  They assisted her back into the house where she was noted to be weak on right side [facility] reports gaze deviation and right-sided weakness with no prior history of seizure.  Head CT showed acute left MCA territory infarct.  No hemorrhage or mass-effect noted.  CTA showed left MCA branch reconstitution with positive finding for emergent large vessel occlusion.  Patient underwent MCA stenting per IR.  Follow-up MRI showed acute infarct left MCA territory involving the basal ganglia as well as the left temporal, frontal and parietal lobes.  Small amount of hemorrhagic process in the left posterior temporal lobe noted.  There were additional small areas of acute infarct in the occipital white matter bilaterally and right cerebellum.  The patient was laying in her bed slightly elevated when I entered the room.  Nursing had been in the room due to the patient is describing GI pain following bariatric swallow test.  Patient was waiting on  lunch.  The patient was awake and alert but with significant expressive aphasia.  While she was able to say words and accurately describe positive versus negative responses grammar was severely impaired as well as circumlocutions and word substitutions as well as other paraphasic errors.  The patient denied significant depression or significant emotional distress although she has acknowledged issues of anxiety and difficulty coping.  The patient's right parietal frontal regions being involved are likely creating some significant issues around problem solving and maintaining linear thinking around processing overall gestalt of situations.  The patient is confused and has difficulty with broader understanding of situations.  While the patient reports that she has been told what happened to her she was unable to verbalize specific details are described difficulties that she was having.  Patient is aware that her expressive language is impaired and that she is not able to communicate effectively as she had prior to this event.  Patient has been making good progress with her dysphagia over the past several days.  Patient still having difficulties following oral commands to swallow on  cue.  The patient has been effectively communicating wants and needs and responded to yes no questions accurately.  Patient still needing max verbal and visual cues to sequence information and significant need for assistance and balance and other motor functions.   Disposition/Plan:  Today we worked on coping and adjustment particular around underlying issues of anxiety following primary left MCA infarction with some other cortical and cerebellar regions impacted.  The patient denies emotional distress to the point impairing his ability to fully participate in therapeutic interventions.  Patient with significant ongoing motor deficits, significant nonfluent aphasia.  We will follow up with the patient next week.  Diagnosis:    Left  middle cerebral artery stroke [facility]) - Plan: Ambulatory referral to Neurology  Encounter for feeding tube placement - Plan: DG Abd 1 View, DG Abd 1 View  Abdominal pain - Plan: DG Abd 1 View, DG Abd 1 View         Electronically Signed   _______________________ Ilean Skill, Psy.D. Clinical Neuropsychologist

## 2020-07-19 NOTE — Progress Notes (Signed)
[facility] PHYSICAL MEDICINE & REHABILITATION PROGRESS NOTE  Subjective/Complaints:  Severe aphasia , per RN passed MBS and upgraded to D3 thin liq  ROS: limited due to language/communication    Objective: Vital Signs: Blood pressure 131/65, pulse 73, temperature 97.8 F (36.6 C), resp. rate 16, height 5\' 2"  (1.575 m), weight 89.8 kg, last menstrual period 09/28/2012, SpO2 100 %. No results found. No results for input(s): WBC, HGB, HCT, PLT in the last 72 hours. Recent Labs    07/17/20 0518  NA 136  K 4.5  CL 100  CO2 27  GLUCOSE 134*  BUN 28*  CREATININE 1.45*  CALCIUM 9.4    Intake/Output Summary (Last 24 hours) at 07/19/2020 0851 Last data filed at 07/19/2020 0700 Gross per 24 hour  Intake 400 ml  Output --  Net 400 ml        Physical Exam: BP 131/65 (BP Location: Right Arm)   Pulse 73   Temp 97.8 F (36.6 C)   Resp 16   Ht 5\' 2"  (1.575 m)   Wt 89.8 kg   LMP 09/28/2012   SpO2 100%   BMI 36.21 kg/m    General: No acute distress Mood and affect are appropriate Heart: Regular rate and rhythm no rubs murmurs or extra sounds Lungs: Clear to auscultation, breathing unlabored, no rales or wheezes Abdomen: Positive bowel sounds, soft nontender to palpation, nondistended Extremities: No clubbing, cyanosis, or edema Skin: No evidence of breakdown, no evidence of rash  Neuro: Global aphasia- attempting to communicate thru words and short phrases Motor: Unable to follow commands consistently, cannot perform formal MMT but is antigravity with resistance in all 4 ext    Assessment/Plan: 1. Functional deficits which require 3+ hours per day of interdisciplinary therapy in a comprehensive inpatient rehab setting.  Physiatrist is providing close team supervision and 24 hour management of active medical problems listed below.  Physiatrist and rehab team continue to assess barriers to discharge/monitor patient progress toward functional and medical goals   Care  Tool:  Bathing    Body parts bathed by patient: Chest,Face,Front perineal area,Abdomen,Left arm,Right arm,Right upper leg,Left upper leg,Left lower leg,Right lower leg,Buttocks   Body parts bathed by helper: Buttocks,Right lower leg,Left lower leg     Bathing assist Assist Level: Supervision/Verbal cueing     Upper Body Dressing/Undressing Upper body dressing   What is the patient wearing?: Dress    Upper body assist Assist Level: Supervision/Verbal cueing    Lower Body Dressing/Undressing Lower body dressing      What is the patient wearing?: Pants,Incontinence brief     Lower body assist Assist for lower body dressing: Contact Guard/Touching assist     Toileting Toileting    Toileting assist Assist for toileting: Contact Guard/Touching assist     Transfers Chair/bed transfer  Transfers assist     Chair/bed transfer assist level: Contact Guard/Touching assist     Locomotion Ambulation   Ambulation assist      Assist level: Contact Guard/Touching assist Assistive device: Walker-rolling Max distance: 155'   Walk 10 feet activity   Assist     Assist level: Contact Guard/Touching assist Assistive device: Walker-rolling   Walk 50 feet activity   Assist Walk 50 feet with 2 turns activity did not occur: Safety/medical concerns  Assist level: Contact Guard/Touching assist Assistive device: Walker-rolling    Walk 150 feet activity   Assist Walk 150 feet activity did not occur: Safety/medical concerns  Assist level: Contact Guard/Touching assist Assistive device: Walker-rolling  Walk 10 feet on uneven surface  activity   Assist Walk 10 feet on uneven surfaces activity did not occur: Safety/medical concerns         Wheelchair     Assist Will patient use wheelchair at discharge?: No             Wheelchair 50 feet with 2 turns activity    Assist            Wheelchair 150 feet activity     Assist            Medical Problem List and Plan: 1.  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  Antithrombotics: -DVT/anticoagulation: Continue Lovenox             -antiplatelet therapy: Aspirin 81 mg daily and Brilinta 90 mg twice daily 3. Pain Management:  Reports headaches at times- continue Tylenol prn 4. Mood: Provide emotional support             -antipsychotic agents: N/A 5. Neuropsych: This patient is not capable of making decisions on her own behalf. 6. Skin/Wound Care: Routine skin checks 7. Fluids/Electrolytes/Nutrition: Routine and outs            CMP 2/7 reviewed and electrolytes are stable.  8.  Poststroke dysphagia:.  Dysphagia #2 nectar liquids.per MBS upgraded to D3 thin             Advance diet as tolerated   Feeding tube for supplemental feeds, intake at 50-60%- if this improves will d/c Feeding tube  9.  Seizure prophylaxis.  Keppra 500 mg twice daily.  EEG negative. D/c Keppra. Continues to be seizure free.   10.  Urinary retention: Continue Urecholine 10 mg 3 times daily.  PVR ok  11.  Diet-controlled diabetes mellitus.  Hemoglobin A1c 5.4.  CBGs discontinued 12.  Hyperlipidemia: Lipitor 13.  Morbid obesity status post gastric sleeve 05/24/2020.  Dietary follow-up 14.  CKD stage III.  Baseline creatinine 1.41-1.59             Cr 1.33 on 2/7 15. Abdominal pain, nausea, emesis:   2/9: stop TF, repeat KUB, zofran ordered, RD consult placed.   2/10: sx better with reduction in TF time/rate  2/11-13: improving, continue small meals, zofran PRN   -TF changed to HS     LOS: 10 days A FACE TO [place] [facility] Courtney Burke 07/19/2020, 8:51 AM

## 2020-07-19 NOTE — Progress Notes (Signed)
Physical Therapy Session Note  Patient Details  Name: Courtney Burke MRN: [id number] Date of Birth: 11-24-1955  Today's Date: 07/19/2020 PT Individual Time: [phone] and 1130-1200 PT Individual Time Calculation (min): 30 min and 8mn  Short Term Goals: Week 1:  PT Short Term Goal 1 (Week 1): Pt will increase bed mobility to S. PT Short Term Goal 1 - Progress (Week 1): Met PT Short Term Goal 2 (Week 1): Pt will increase transfers to c/g. PT Short Term Goal 2 - Progress (Week 1): Updated due to goal met PT Short Term Goal 3 (Week 1): Pt will increase ambulation with LRAD about 100 feet with c/g PT Short Term Goal 3 - Progress (Week 1): Partly met PT Short Term Goal 4 (Week 1): Pt will ascend/descend 4 stairs with B rails and c/g.. PT Short Term Goal 4 - Progress (Week 1): Partly met Week 2:  PT Short Term Goal 1 (Week 2): Pt will perform all functional transfers at supervision assist level. PT Short Term Goal 2 (Week 2): Pt will ambulate with LRAD at least 150 with supervision and improved RLE step-through pattern. PT Short Term Goal 3 (Week 2): Pt will ascend/descend 4 stairs with B rails and supervision.  Skilled Therapeutic Interventions/Progress Updates:  Session 1: Pt supine in bed upon PT arrival and having returned from swallow study. Appreciate ST's note that modified barium swallow study went well and pt's diet has been upgraded to D3 solids and thin liquid. Patient indicates that her abdomen is irritating her and she does not feel well but with encouragement she is agreeable to push ambulation to later and work on standing balance for now in this short PT session. Patient denied overt pain during session.  Therapeutic Activity: Bed Mobility: Patient performed bed mobility with indication of irritation in abdomen but completes with extra time and effort supervision level.  Transfers: Patient performed STS and SPVT transfers throughout session with slow movements with tenderness to  abdomen but continues to perform rise to stand with supervision and good technique. Provided verbal cues for bringing BUE back to w/c armrests to assist with controlling descent to sit.  Neuromuscular Re-ed: NMR facilitated during session with focus on standing balance and dual tasking. Pt guided in single target drill using entire screen on BITS with 60% of targets to L side of screen. Pt is able to perform with no BUE support using self chosen UE to target with CGA and no LOB. She completes 77 targets in 377m drill with 63.64% accuracy (other fingers hitting screen) with 2.33 sec reaction time demonstrating improvement in attention to R visual field and in reaction time to target. NMR performed for improvements in motor control and coordination, balance, sequencing, judgement, and self confidence/ efficacy in performing all aspects of mobility at highest level of independence.   Patient supine in bed at end of session with brakes locked, bed alarm set, and all needs within reach.   Session 2:  Patient supine in bed upon PT arrival having just returned to bed with NT from moving bowels at toilet. Pt indicates that she is feeling better than previous session. Patient alert and agreeable to PT session. Patient denied pain during session.  Therapeutic Activity: Bed Mobility: Patient performed supine to/from sit with supervision and improved overall performance from first session.  Transfers: Patient performed STS and SPVT transfers throughout session bed <> RW <> w/c. Provided verbal cues for hand progression and picking up R foot during pivot transfer.  Gait Training:  Patient ambulated 203' x1/ 135' x1 using RW and CGA/ supervision with w/c follow for fatigue. She continues to drag R foot throughout ambulation despite continued vc for increasing step height and stating that she can hear that she is dragging R foot. Attempt to provide additional flexion to pt's R knee during second bout with vc to  improve motor control and implementation of pt's understanding with no follow through.   Patient positioned in semifowlers position in bed at end of session, ready for lunch, with brakes locked, bed alarm set, and all needs within reach.    Therapy Documentation Precautions:  Precautions Precautions: Fall Precaution Comments: globally aphasic, NG tube Restrictions Weight Bearing Restrictions: No   Therapy/Group: Individual Therapy  Alger Simons 07/19/2020, 6:18 PM

## 2020-07-20 LAB — GLUCOSE, CAPILLARY
Glucose-Capillary: 150 mg/dL — ABNORMAL HIGH (ref 70–99)
Glucose-Capillary: 198 mg/dL — ABNORMAL HIGH (ref 70–99)
Glucose-Capillary: 123 mg/dL — ABNORMAL HIGH (ref 70–99)
Glucose-Capillary: 108 mg/dL — ABNORMAL HIGH (ref 70–99)
Glucose-Capillary: 103 mg/dL — ABNORMAL HIGH (ref 70–99)

## 2020-07-20 MED ORDER — BOOST / RESOURCE BREEZE PO LIQD CUSTOM
1.0000 | Freq: Three times a day (TID) | ORAL | Status: DC
  Administered 2020-07-20 – 2020-07-24 (×7): 1 via ORAL

## 2020-07-20 MED ORDER — ENSURE ENLIVE PO LIQD: 237.0000 mL | Freq: Three times a day (TID) | ORAL | Status: DC

## 2020-07-20 MED ORDER — PROSOURCE TF PO LIQD: 45.0000 mL | Freq: Three times a day (TID) | ORAL | Status: DC

## 2020-07-20 MED ORDER — PROSOURCE PLUS PO LIQD
30.0000 mL | Freq: Three times a day (TID) | ORAL | Status: DC
  Administered 2020-07-20 – 2020-07-25 (×16): 30 mL via ORAL
  Filled 2020-07-20 (×17): qty 30

## 2020-07-20 NOTE — Patient Care Conference (Signed)
Inpatient RehabilitationTeam Conference and Plan of Care Update Date: 07/20/2020   Time: 10:16 AM    Patient Name: Courtney Burke      Medical Record Number: [id number]  Date of Birth: July 24, 1955 Sex: Female         Room/Bed: 4W06C/4W06C-01 Payor Info: Payor: [employer] MEDICARE / Plan: [facility] MEDICARE / Product Type: *No Product type* /    Admit Date/Time:  07/09/2020  2:20 PM  Primary Diagnosis:  Left middle cerebral artery stroke [facility])  Hospital Problems: Principal Problem:   Left middle cerebral artery stroke [facility])    Expected Discharge Date: Expected Discharge Date: 07/26/20  Team Members Present: Physician leading conference: Dr. Alysia Penna Care Coodinator Present: Dorien Chihuahua, RN, BSN, CRRN;Christina Sampson Goon, [place] Nurse Present: Glenis Smoker, RN PT Present: Barrie Folk, PT OT Present: Other (comment) Providence Lanius, OT) SLP Present: Jettie Booze, CF-SLP PPS Coordinator present : Gunnar Fusi, SLP     Current Status/Progress Goal Weekly Team Focus  Bowel/Bladder   Patient is continent of bladder/bowel, LBM 07/19/20  Pt will remain continent x2.  QS/PRN assessment of toileting needs   Swallow/Nutrition/ Hydration   Dys 3 textures, thin liquids, full supervision, Min A  Min A  tolearnce upgraded diet - Dys 3/thin, carryover swallow strategies   ADL's   S for UB ADLs, min A for LBD/LBB, CGA for toilet/shower transfer  Supervision  RUE NMR, functional transfers, dynamic standing/sitting balance, ADL retraining, pt/fam edu, AD/DME ed, praxis/func cog   Mobility             Communication   Max A, expressive deficits more severe than receptive but still present. Neologisms, oral apraxia. Written cues help with accurate productions, but not really associating meaning  Mod A  matching words to objects, using multimodal communication, following basic directions, functional words/phrases, basic CV CVC words, automatic speech   Safety/Cognition/ Behavioral  Observations            Pain   Patient denies pain  Pain will remain <3.  QS/PRN assessment   Skin   Skin intact  Skin will remain WNL.  QS/PRN assessment of skin     Discharge Planning:  D/C home with dauhgter to provide care (works during the day patient friend/retired RN will stay with patient when dtr works)   Team Discussion: Assess removal of cortrak tube; MD discontinued HS tube feedings and monitoring intake with cal count. Dietician supplemented ensure between meals. Given recent gastric bypass surgery; patient may benefit/tolerate several small meals throughout the day better than three regular meals. Continent of bowe land bladder. Communicates with gestures. Receptive aphasia has improved but not enough to use a picture board for communication. Patient on target to meet rehab goals: yes, currently supervision for upper body ADLs and min assist for lower body care. CGA for toilet transfers. Able to ambulate with a rolling walking and supervision -CGA; trouble attending to left foot. Has good motor control however it is functionally difficult to move foot off floor. Supervision goals set for discharge.  *See Care Plan and progress notes for long and short-term goals.   Revisions to Treatment Plan:  MBS pending; hopeful to advance to D3 thin diet. Teaching Needs: Transfers, toileting, medications, secondary stroke risk management, etc.  Current Barriers to Discharge: Decreased caregiver support and Nutritional means  Possible Resolutions to Barriers: Family education     Medical Summary Current Status: severe aphasia, Diet upgraded, intake is low  Barriers to Discharge: Medical stability;Nutrition means   Possible  Resolutions to [employer]: stop nocturnal TF, cal count x 3 d, ?d/c Feeding tube this week   Continued Need for Acute Rehabilitation Level of Care: The patient requires daily medical management by a physician with specialized training in physical  medicine and rehabilitation for the following reasons: Direction of a multidisciplinary physical rehabilitation program to maximize functional independence : Yes Medical management of patient stability for increased activity during participation in an intensive rehabilitation regime.: Yes Analysis of laboratory values and/or radiology reports with any subsequent need for medication adjustment and/or medical intervention. : Yes   I attest that I was present, lead the team conference, and concur with the assessment and plan of the team.   Dorien Chihuahua B 07/20/2020, 3:41 PM

## 2020-07-20 NOTE — Progress Notes (Signed)
Offered ensure but patient refused claims she does not drink ensure.

## 2020-07-20 NOTE — Progress Notes (Signed)
Speech Language Pathology Daily Session Note  Patient Details  Name: Lashawnta Burgert MRN: [id number] Date of Birth: [birth year]  Today's Date: 07/20/2020 SLP Individual Time: 0730-0825 SLP Individual Time Calculation (min): 55 min  Short Term Goals: Week 2: SLP Short Term Goal 1 (Week 2): STG=LTG due to remaining length of stay  Skilled Therapeutic Interventions: Pt was seen for skilled ST targeting dysphagia and communication goals. SLP facilitated session with overall Min A verbal cues for use of compensatory swallow strategies during pt's intake of recently upgraded dysphagia 3 (mechanical soft), thin liquid texture breakfast tray. Although pt intermittently took consecutive sips of drinks and large bites of egg, no overt s/sx aspiration observed and her mastication and oral clearance was functional. Recommend continue current diet. Pt attempted to communicate a message at start of more structured language tasks. With Moderate question cues for clarification, pt used gestures to communicate the message that she was having difficulty seeing and made a gesture for glasses. SLP could not locate glasses in pt's room, but chose an activity in which pt communicated she could adequately see. During a basic phrase completion task, pt completed the phrase with ~60% accuracy in 12 out of 17 opportunities. She became mildly perseverative on words throughout task and required Mod A verbal feedback and recasted models for awareness of verbal errors throughout session. Pt left sitting in bed with alarm set and needs within reach. Continue per current plan of care.      Pain Pain Assessment Pain Scale: 0-10 Pain Score: 0-No pain  Therapy/Group: Individual Therapy  Arbutus Leas 07/20/2020, 7:20 AM

## 2020-07-20 NOTE — Progress Notes (Signed)
[place] PHYSICAL MEDICINE & REHABILITATION PROGRESS NOTE  Subjective/Complaints:  No issues overnite, Intake still ~50%, discussed with RD, pt s/p bypass, feeding tube in jejunum  ROS: limited due to language/communication    Objective: Vital Signs: Blood pressure 107/60, pulse 70, temperature 97.7 F (36.5 C), temperature source Oral, resp. rate 16, height _0  (1.575 m), weight 89.2 kg, last menstrual period 09/28/2012, SpO2 100 %. DG Swallowing Func-Speech Pathology  Result Date: 07/19/2020 Objective Swallowing Evaluation: Type of Study: MBS-Modified Barium Swallow Study  Patient Details Name: Courtney Burke MRN: [id number] Date of Birth: [birth year] Today's Date: 07/19/2020 Past Medical History: Past Medical History: Diagnosis Date . Asthma  . Borderline diabetes  . Hypertension  Past Surgical History: Past Surgical History: Procedure Laterality Date . ANKLE SURGERY   . CARPAL TUNNEL RELEASE   . carpel tunnel   . IR CT HEAD LTD  06/30/2020 . IR INTRA CRAN STENT  06/30/2020 . IR PERCUTANEOUS ART THROMBECTOMY/INFUSION INTRACRANIAL INC DIAG ANGIO  06/30/2020 . RADIOLOGY WITH ANESTHESIA N/A 06/30/2020  Procedure: RADIOLOGY WITH ANESTHESIA;  Surgeon: Radiologist, Medication, MD;  Location: [facility];  Service: Radiology;  Laterality: N/A; HPI: Courtney Burke is a 65 y.o. 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 CT probable few small areas of acute left MCA infarction, less apparent. Ill-defined patchy hyperdensity may reflect contrast. Found to have M1 occlusion and underwent thrombectomy and intubated 1/27-1/28 am. Pt admitted to [facility] 07/09/20.  Assessment / Plan / Recommendation CHL IP CLINICAL IMPRESSIONS 07/19/2020 Clinical Impression Pt presents with mild oropharyngeal dysphagia, but excellent functional improvements since last MBSS performed 07/04/20. Although she took very large  consecutive sips despite cues to slow rate and use single sips, no instances of aspiration of thin barium was observed throughout multiple trials. Only intermittent flash penetration of thin barium observed (and mostly at the start of the study). However, 100% of penetrates remained far above the level of the vocal folds and were fully ejected from the vestibule during the swallow. Consumption via cup or straw did not impact her ability to protect her airway. Her swallow initiation was timely in 95% of instances, and even when swallow initiated at pyriform sinuses X1, laryngeal vestibule closure was effective to prevent intrusion. Pt exhibited functional mastication of dysphagia 3 (mechanical soft) solids, although piecemeal swallowing noted. She independently used extra swallows to clear min oral residue. When attempting to swallow barium pill with thin, pt unable to follow oral motor command to swallow as 1 cohesive bolus - instead she chewed the pill. Given results of today's study, recommend pt upgrade to dysphagia 3 (mechanical soft) textures, thin liquids, medications should be crushed in applesauce or pudding. Pt should still have full supervision during PO intake. Encourage pt to take small, slow sips of drinks and check for oral residue/pocketing, cue pt to use liquid washes to assist with full oral clearance. ST will continue to provide skilled interventions to ensure diet safety and efficiency and facilitate greater carryover of compensatory swallow strategies. SLP Visit Diagnosis Dysphagia, oropharyngeal phase (R13.12) Attention and concentration deficit following -- Frontal lobe and executive function deficit following -- Impact on safety and function Mild aspiration risk   CHL IP TREATMENT RECOMMENDATION 07/04/2020 Treatment Recommendations Therapy as outlined in treatment plan below   Prognosis 07/04/2020 Prognosis for Safe Diet Advancement Good Barriers to Reach Goals -- Barriers/Prognosis Comment --  CHL IP DIET RECOMMENDATION 07/19/2020 SLP Diet Recommendations Dysphagia 3 (Mech soft) solids;Thin liquid Liquid Administration via Cup;Straw Medication Administration Crushed with puree Compensations Slow rate;Small sips/bites;Minimize environmental distractions;Follow solids with liquid Postural Changes Remain semi-upright after after feeds/meals (Comment);Seated upright at 90 degrees   CHL IP OTHER RECOMMENDATIONS 07/19/2020 Recommended Consults -- Oral Care Recommendations Oral care BID Other Recommendations --   CHL IP FOLLOW UP RECOMMENDATIONS 07/07/2020 Follow up Recommendations Inpatient Rehab   CHL IP FREQUENCY AND DURATION 07/04/2020 Speech Therapy Frequency (ACUTE ONLY) min 2x/week Treatment Duration 2 weeks      CHL IP ORAL PHASE 07/19/2020 Oral Phase -- Oral - Pudding Teaspoon -- Oral - Pudding Cup -- Oral - Honey Teaspoon -- Oral - Honey Cup -- Oral - Nectar Teaspoon -- Oral - Nectar Cup NT Oral - Nectar Straw NT Oral - Thin Teaspoon -- Oral - Thin Cup Delayed oral transit Oral - Thin Straw Delayed oral transit Oral - Puree NT Oral - Mech Soft Piecemeal swallowing Oral - Regular -- Oral - Multi-Consistency -- Oral - Pill Decreased bolus cohesion;Delayed oral transit;Lingual/palatal residue;Other (Comment) Oral Phase - Comment --  CHL IP PHARYNGEAL PHASE 07/19/2020 Pharyngeal Phase Impaired Pharyngeal- Pudding Teaspoon -- Pharyngeal -- Pharyngeal- Pudding Cup -- Pharyngeal -- Pharyngeal- Honey Teaspoon -- Pharyngeal -- Pharyngeal- Honey Cup -- Pharyngeal -- Pharyngeal- Nectar Teaspoon -- Pharyngeal -- Pharyngeal- Nectar Cup NT Pharyngeal -- Pharyngeal- Nectar Straw NT Pharyngeal -- Pharyngeal- Thin Teaspoon -- Pharyngeal -- Pharyngeal- Thin Cup Penetration/Aspiration during swallow Pharyngeal Material enters airway, remains ABOVE vocal cords then ejected out Pharyngeal- Thin Straw WFL Pharyngeal Material does not enter airway Pharyngeal- Puree NT Pharyngeal -- Pharyngeal- Mechanical Soft WFL Pharyngeal --  Pharyngeal- Regular -- Pharyngeal -- Pharyngeal- Multi-consistency -- Pharyngeal -- Pharyngeal- Pill WFL Pharyngeal -- Pharyngeal Comment --  CHL IP CERVICAL ESOPHAGEAL PHASE 07/19/2020 Cervical Esophageal Phase WFL Pudding Teaspoon -- Pudding Cup -- Honey Teaspoon -- Honey Cup -- Nectar Teaspoon -- Nectar Cup -- Nectar Straw -- Thin Teaspoon -- Thin Cup -- Thin Straw -- Puree -- Mechanical Soft -- Regular -- Multi-consistency -- Pill -- Cervical Esophageal Comment -- Arbutus Leas 07/19/2020, 9:47 AM              No results for input(s): WBC, HGB, HCT, PLT in the last 72 hours. No results for input(s): NA, K, CL, CO2, GLUCOSE, BUN, CREATININE, CALCIUM in the last 72 hours.  Intake/Output Summary (Last 24 hours) at 07/20/2020 6314 Last data filed at 07/19/2020 1807 Gross per 24 hour  Intake 360 ml  Output --  Net 360 ml        Physical Exam: BP 107/60 (BP Location: Left Arm)   Pulse 70   Temp 97.7 F (36.5 C) (Oral)   Resp 16   Ht _0  (1.575 m)   Wt 89.2 kg   LMP 09/28/2012   SpO2 100%   BMI 35.97 kg/m    General: No acute distress Mood and affect are appropriate Heart: Regular rate and rhythm no rubs murmurs or extra sounds Lungs: Clear to auscultation, breathing unlabored, no rales or wheezes Abdomen: Positive bowel sounds, soft nontender to palpation, nondistended Extremities: No clubbing, cyanosis, or edema Skin: No evidence of breakdown, no evidence of rash 4/5 R side, 5/5 left side  Neuro: Global aphasia- attempting to communicate thru words and short phrases Motor: Unable to follow commands consistently, cannot perform formal MMT but is antigravity with resistance in all 4 ext    Assessment/Plan: 1. Functional  deficits which require 3+ hours per day of interdisciplinary therapy in a comprehensive inpatient rehab setting.  Physiatrist is providing close team supervision and 24 hour management of active medical problems listed below.  Physiatrist and rehab team continue  to assess barriers to discharge/monitor patient progress toward functional and medical goals   Care Tool:  Bathing    Body parts bathed by patient: Chest,Face,Front perineal area,Abdomen,Left arm,Right arm,Right upper leg,Left upper leg,Left lower leg,Right lower leg   Body parts bathed by helper: Buttocks     Bathing assist Assist Level: Minimal Assistance - Patient > 75%     Upper Body Dressing/Undressing Upper body dressing   What is the patient wearing?: Pull over shirt,Dress    Upper body assist Assist Level: Supervision/Verbal cueing    Lower Body Dressing/Undressing Lower body dressing      What is the patient wearing?: Pants,Incontinence brief     Lower body assist Assist for lower body dressing: Supervision/Verbal cueing     Toileting Toileting    Toileting assist Assist for toileting: Supervision/Verbal cueing     Transfers Chair/bed transfer  Transfers assist     Chair/bed transfer assist level: Supervision/Verbal cueing     Locomotion Ambulation   Ambulation assist      Assist level: Supervision/Verbal cueing Assistive device: Walker-rolling Max distance: 155'   Walk 10 feet activity   Assist     Assist level: Contact Guard/Touching assist Assistive device: Walker-rolling   Walk 50 feet activity   Assist Walk 50 feet with 2 turns activity did not occur: Safety/medical concerns  Assist level: Contact Guard/Touching assist Assistive device: Walker-rolling    Walk 150 feet activity   Assist Walk 150 feet activity did not occur: Safety/medical concerns  Assist level: Contact Guard/Touching assist Assistive device: Walker-rolling    Walk 10 feet on uneven surface  activity   Assist Walk 10 feet on uneven surfaces activity did not occur: Safety/medical concerns         Wheelchair     Assist Will patient use wheelchair at discharge?: No             Wheelchair 50 feet with 2 turns  activity    Assist            Wheelchair 150 feet activity     Assist           Medical Problem List and Plan: 1.  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 Formulized individual treatment plan based on medical history, underlying problem and comorbidities.  2.  Antithrombotics: -DVT/anticoagulation: Continue Lovenox             -antiplatelet therapy: Aspirin 81 mg daily and Brilinta 90 mg twice daily 3. Pain Management:  Reports headaches at times- continue Tylenol prn 4. Mood: Provide emotional support             -antipsychotic agents: N/A 5. Neuropsych: This patient is not capable of making decisions on her own behalf. 6. Skin/Wound Care: Routine skin checks 7. Fluids/Electrolytes/Nutrition: Routine and outs            CMP 2/7 reviewed and electrolytes are stable.  8.  Poststroke dysphagia:.  Dysphagia #2 nectar liquids.per MBS upgraded to D3 thin  Advance diet as tolerated   Feeding tube for supplemental feeds, intake at 50%- if this improves will d/c Feeding tube  9.  Seizure prophylaxis.  Keppra 500 mg twice daily.  EEG negative. D/c Keppra. Continues to be seizure free.   10.  Urinary retention: Continue Urecholine 10 mg 3 times daily.  PVR ok  11.  Diet-controlled diabetes mellitus.  Hemoglobin A1c 5.4.  CBGs discontinued 12.  Hyperlipidemia: Lipitor 13.  Morbid obesity status post gastric sleeve 05/24/2020.  Dietary follow-up 14.  CKD stage III.  Baseline creatinine 1.41-1.59             Cr 1.33 on 2/7 15. Abdominal pain, nausea, emesis:   2/9: stop TF, repeat KUB, zofran ordered, RD consult placed.   2/10: sx better with reduction in TF time/rate  2/11-13: improving, continue small meals, zofran PRN   -TF  changed to HS     LOS: 11 days A FACE TO [place] E Loula Marcella 07/20/2020, 9:28 AM

## 2020-07-20 NOTE — Progress Notes (Signed)
Physical Therapy Session Note  Patient Details  Name: Courtney Burke MRN: [id number] Date of Birth: 07/07/1955  Today's Date: 07/20/2020 PT Individual Time: 1132-1200 PT Individual Time Calculation (min): 28 min   Short Term Goals: Week 2:  PT Short Term Goal 1 (Week 2): Pt will perform all functional transfers at supervision assist level. PT Short Term Goal 2 (Week 2): Pt will ambulate with LRAD at least 150 with supervision and improved RLE step-through pattern. PT Short Term Goal 3 (Week 2): Pt will ascend/descend 4 stairs with B rails and supervision.  Skilled Therapeutic Interventions/Progress Updates:   Pt received supine in bed and agreeable to PT. Supine>sit transfer with supervision assist mild use of bed rail. Stand pivot transfer to [facility] with RW and supervision assist from PT. Mild foot drag on the R. Pt transported to rehab gym in [facility]. Gait training with RW x 2f with supervision assist min assist initially to improve step length on the R. Pt able to maintain step through gait pattern throughout with only initial cues.  Dynamic standing balance while engaged in fine motor task of connect four. Completed x 1 round from level surface and x 1 round standing on airex pad. Cues to use RUE but pt continues to use LUE 75% of the time. Regardless of cues from PT. No LOB throughout balance training.   Pt returned to room and performed stand pivot transfer to bed with supervision A and use of bed rail Sit>supine completed with supervision assist and left supine in bed with call bell in reach and all needs met.        Therapy Documentation Precautions:  Precautions Precautions: Fall Precaution Comments: globally aphasic, NG tube Restrictions Weight Bearing Restrictions: No    Pain: Pain Assessment Pain Scale: 0-10 Pain Score: 0-No pain    Therapy/Group: Individual Therapy  ALorie Phenix2/16/2022, 12:04 PM

## 2020-07-20 NOTE — Progress Notes (Signed)
Patient ID: Courtney Burke, female   DOB: 16-Nov-1955, 65 y.o.   MRN: [id number] Team Conference Report to Patient/Family  Team Conference discussion was reviewed with the patient and caregiver, including goals, any changes in plan of care and target discharge date.  Patient and caregiver express understanding and are in agreement.  The patient has a target discharge date of 07/26/20.  Dyanne Iha 07/20/2020, 2:01 PM

## 2020-07-20 NOTE — Progress Notes (Signed)
Occupational Therapy Session Note  Patient Details  Name: Courtney Burke MRN: [id number] Date of Birth: Jul 03, 1955  Today's Date: 07/20/2020 OT Individual Time: 8828-0034 OT Individual Time Calculation (min): 57 min    Short Term Goals: Week 2:  OT Short Term Goal 1 (Week 2): STG = LTG 2/2 ELOS  Skilled Therapeutic Interventions/Progress Updates:    Pt received semi-reclined in bed with RN present, agreeable to therapy. Session focus on dressing, toileting, standing grooming, and dynamic standing balance, and problem solving in prep for improved ADL/IADL performance. Supine > sitting EOB, doffed/donned shirt and pants with close S. Amb transfer to toilet with RW + close S to CGA 2/2 urgency. Cont void of bladder. LB clothing management/anterior peri care with close S. Stood at sink to fix hair and brush teeth with close S. W/c transport to gym 2/2 time management/energy conservation. Stood for first 3.30 min, 4 min, and 4 min to complete visuoperceptual constructive task, progressing from level 2 to level 3 difficulty and requiring overall mod A for problem solving and error awareness. Additionally challenged dynamic standing balance on airex pad with close S, no LOB noted. Amb transfer back to bed with RW and sitting EOB > supine with close S. Donned/doffed B shoes with close S.  Pt left with HOB at 30 degrees with bed alarm engaged, call bell in reach, and all immediate needs met.    Therapy Documentation Precautions:  Precautions Precautions: Fall Precaution Comments: globally aphasic, NG tube Restrictions Weight Bearing Restrictions: No Pain: Pain Assessment Pain Scale: 0-10 Pain Score: 0-No pain ADL: See Care Tool for more details.  Therapy/Group: Individual Therapy  Volanda Napoleon MS, OTR/L  07/20/2020, 10:20 AM

## 2020-07-20 NOTE — Progress Notes (Signed)
Physical Therapy Session Note  Patient Details  Name: Courtney Burke MRN: [id number] Date of Birth: 1955/10/10  Today's Date: 07/20/2020 PT Individual Time: 1000-1046 PT Individual Time Calculation (min): 46 min   Short Term Goals: Week 2:  PT Short Term Goal 1 (Week 2): Pt will perform all functional transfers at supervision assist level. PT Short Term Goal 2 (Week 2): Pt will ambulate with LRAD at least 150 with supervision and improved RLE step-through pattern. PT Short Term Goal 3 (Week 2): Pt will ascend/descend 4 stairs with B rails and supervision.   Skilled Therapeutic Interventions/Progress Updates:    Patient supine in bed upon PT arrival. Patient alert and agreeable to PT session. Patient denied pain throughout session.  Therapeutic Activity: Bed Mobility: Patient performed supine <> sit with supervision. Provided verbal cues for initiation for coming to sit and at end of session for returning to supine.  Transfers: Patient performed sit to stand throughout session with supervision and no vc with good technique. Stand to sit requires supervision and continued cues for reaching back to chair with at least one UE for improved safety and controlled descent. Able to perform correctly ~50% of attempts. SPVT using RW with supervision/ CGA and RW with vc for completing turn prior to initiating descent to sit.   Gait Training:  Patient ambulated 98' x1/ 100' x1 feet using RW with supervision/ CGA for fatigue. In first bout, pt ambulated with decreased R hip and knee flexion and audible slide forward of R foot. Pt given Mod A for proper flexion of hip/ knee to advance RLE with each step. Provided vc for timing of step. Pt is able to minimally return demo for a few steps. Final amb bout with some brief improved quality of steps throughout. Pt fatigued and ambulating with increased effort.   Neuromuscular Re-ed: NMR facilitated during session with focus on motor planning and control of RLE to  produce stepping motion in RLE throughout ambulation. Initially, pt guided in standing balance of kicking ball and is able to maintain balance with use of either foot and use of RW for BUE support. Pt also guided through stepping of RLE onto colored dots on floor, stepping into squares of floor ladder leading with RLE, and stepping out with RLE to propel w/c with RLE only in order to promote increased hip and knee flexion in coordination for improved quality of swing-through motion during gait.  Moderate carryover noted briefly following performance with continued cueing into gait.  NMR performed for improvements in motor control and coordination, balance, sequencing, judgement, and self confidence/ efficacy in performing all aspects of mobility at highest level of independence.   Pt demos some discouragement at end of session. Pt provided with continued encouragement and therapeutic alliance that increased effort is difficult to manage with what feels to minimal returns, but pt is already demonstrating progress since [facility] and should continue to demo progress with continued participation.   Patient supine in bed at end of session with brakes locked, bed alarm set, and all needs within reach.   Therapy Documentation Precautions:  Precautions Precautions: Fall Precaution Comments: globally aphasic, NG tube Restrictions Weight Bearing Restrictions: No   Therapy/Group: Individual Therapy  Alger Simons 07/20/2020, 10:58 AM

## 2020-07-20 NOTE — Progress Notes (Signed)
Patient ID: Courtney Burke, female   DOB: [birth year], 65 y.o.   MRN: [id number]   Family education scheduled Friday, February 18th 10-12 PM and 1-2 PM.  Courtney Burke, [place]

## 2020-07-20 NOTE — Progress Notes (Addendum)
Brief Nutrition Note  72-hour calorie count ordered. Discussed pt with MD. Plan to discontinue nocturnal tube feeds to stimulate appetite and promote PO intake during the day. Pt with post-pyloric Cortrak tube in place so unable to administer boluses of tube feeing.  Discussed plan for 72-hour calorie count with RN and NT. RD placed calorie count envelope on pt's door. Instructions for calorie count written on envelope and written in calorie count order. Discussed instructions with NT. Calorie count to begin today with lunch meal. RD will follow up tomorrow, 07/21/20, with day 1 results.  Will continue with current oral nutrition supplements to maximize kcal and protein intake: - Ensure Enlive po TID, each supplement provides 350 kcal and 20 grams of protein - Magic Cup TID with meals, each supplement provides 290 kcal and 9 grams of protein  ADDENDUM (1240): RN notified RD that pt states that she does not like Ensure supplements. Will d/c Ensure and order Boost Breeze TID.   Gustavus Bryant, MS, RD, LDN Inpatient Clinical Dietitian Please see AMiON for contact information.

## 2020-07-20 NOTE — Progress Notes (Signed)
Calorie Count Note  72-hour calorie count ordered. Calorie count started yesterday (2/16) at lunch meal. Please see day 1 results below. RD will follow up tomorrow (2/18) for day 2 results.  Diet: dysphagia 3, thin liquids Supplements:  - Boost Breeze po TID, each supplement provides 250 kcal and 9 grams of protein - ProSource Plus 30 ml po TID, each supplement provides 100 kcal and 15 grams of protein - Magic Cup TID with meals, each supplement provides 290 kcal and 9 grams of protein  Day 1: 2/16 Lunch: 51 kcal, 2 grams of protein 2/16 Dinner: 389 kcal, 20 grams of protien 2/17 Breakfast: 431 kcal, 17 grams of protein Supplements: 700 kcal, 48 grams of protein (2 Boost Breeze, 2 ProSource Plus)  Day 1 total 24-hour intake: 1571 kcal (98% of minimum estimated needs)  87 grams of protein (97% of minimum estimated needs)  Nutrition Diagnosis: Inadequate oral intake related to dysphagia,decreased appetite as evidenced by meal completion < 50%.  Goal: Patient will meet greater than or equal to 90% of their needs  Intervention:  - Continue 72-hour calorie count - Continue ProSource Plus TID - Continue Boost Breeze TID - Continue Magic Cup TID - Continue MVI with minerals BID - Continue calcium citrate TID   Gustavus Bryant, MS, RD, LDN Inpatient Clinical Dietitian Please see AMiON for contact information.

## 2020-07-21 LAB — GLUCOSE, CAPILLARY
Glucose-Capillary: 102 mg/dL — ABNORMAL HIGH (ref 70–99)
Glucose-Capillary: 106 mg/dL — ABNORMAL HIGH (ref 70–99)
Glucose-Capillary: 109 mg/dL — ABNORMAL HIGH (ref 70–99)
Glucose-Capillary: 119 mg/dL — ABNORMAL HIGH (ref 70–99)
Glucose-Capillary: 95 mg/dL (ref 70–99)

## 2020-07-21 NOTE — Progress Notes (Addendum)
Patient ID: Courtney Burke, female   DOB: 1955/11/10, 65 y.o.   MRN: [id number]   Patient Wheelchair, 3N1, TTB and RW ordered through adapt.   [place], [place]

## 2020-07-21 NOTE — Progress Notes (Signed)
Occupational Therapy Session Note  Patient Details  Name: Courtney Burke MRN: [id number] Date of Birth: [birth year]  Today's Date: 07/21/2020 OT Individual Time: 1000-1053 OT Individual Time Calculation (min): 53 min    Short Term Goals: Week 2:  OT Short Term Goal 1 (Week 2): STG = LTG 2/2 ELOS  Skilled Therapeutic Interventions/Progress Updates:    Pt received in w/c with RN present, agreeable to therapy. Doffed/donned shirt with close S. Declined standing at sink, but completed hair grooming/oral care with distant S. W/c transport to kitchen 2/2 time management/and energy conservation. To target activity tolerance, RUE NMR, and dynamic standing balance, pt baked cookies and washed pans at sink. Pt initially standing to mix together ingredients, initiating seated rest break after ~ 2 min. Attempted to pour mix before opening, req min A to open and error awareness. Good manipulation of spoon with R hand. Req assist to fully pour dry and liquid ingredients. After min demonstrational cuing, able to spoon dough onto baking sheet. Stood for ~ 6 min at sink to adequately wash dishes with no cuing, using counter for standing support.Overall demonstrating mod improvement in problem solving and functional cognition to complete BADL/ and basic IADL. Transport back to room, amb transfer to bed with RW with close S. Sitting EOB > supine with close S, doffed B shoes with close S. Pt left with HOB at 30 degrees with bed alarm engaged, call bell in reach, and all immediate needs met.    Therapy Documentation Precautions:  Precautions Precautions: Fall Precaution Comments: globally aphasic, NG tube Restrictions Weight Bearing Restrictions: No Pain: Pain Assessment Pain Scale: Faces Pain Score: 0-No pain ADL: See Care Tool for more details.   Therapy/Group: Individual Therapy  Volanda Napoleon MS, OTR/L  07/21/2020, 11:00 AM

## 2020-07-21 NOTE — Progress Notes (Signed)
Physical Therapy Session Note  Patient Details  Name: Courtney Burke MRN: [id number] Date of Birth: 23-Mar-1956  Today's Date: 07/21/2020 PT Individual Time: 7425-9563 PT Individual Time Calculation (min): 30 min   Short Term Goals: Week 1:  PT Short Term Goal 1 (Week 1): Pt will increase bed mobility to S. PT Short Term Goal 1 - Progress (Week 1): Met PT Short Term Goal 2 (Week 1): Pt will increase transfers to c/g. PT Short Term Goal 2 - Progress (Week 1): Updated due to goal met PT Short Term Goal 3 (Week 1): Pt will increase ambulation with LRAD about 100 feet with c/g PT Short Term Goal 3 - Progress (Week 1): Partly met PT Short Term Goal 4 (Week 1): Pt will ascend/descend 4 stairs with B rails and c/g.. PT Short Term Goal 4 - Progress (Week 1): Partly met Week 2:  PT Short Term Goal 1 (Week 2): Pt will perform all functional transfers at supervision assist level. PT Short Term Goal 2 (Week 2): Pt will ambulate with LRAD at least 150 with supervision and improved RLE step-through pattern. PT Short Term Goal 3 (Week 2): Pt will ascend/descend 4 stairs with B rails and supervision. Week 3:     Skilled Therapeutic Interventions/Progress Updates:    PAIN  Denies pain  Pt initially supine and agreeable to treatment.  Supine to sit w/supervision.  Dons shoes in sitting w/supervision.  Sit to stand w/RW and cga.  Gait 163f to Dayroom w/cga, drags R leg w/no knee flexion step to gait despite heavy verbal and tactile cueing to promote hip and knee flexion.   Standing tapping 3.5in step to promote hip and knee flexion x 30 RLE, pt then repeats w/LLE despite instruction w/heavy cuing only to perform on R, UE support on RW w/task, cga  Standing balance:  Standing w/single UE support reaches x 4 quadrants first w/alternating UE use then w/each UE individually x 18 reps each way, cga, no balance loss.  Backing x 545fw/RW, cues to progress RLE adequately, cga  Pt transported to room.  Sit to  stand and short distance gait x 29f10fo bed, turn/sit to bed w/RW and cga. Removes shoes independently, sit to supine w/supervision. Pt left supine w/rails up x 4, alarm set, bed in lowest position, and needs in reach.   Therapy Documentation Precautions:  Precautions Precautions: Fall Precaution Comments: globally aphasic, NG tube Restrictions Weight Bearing Restrictions: No    Therapy/Group: Individual Therapy  BarCallie FieldingT [place]17/2022, 3:43 PM

## 2020-07-21 NOTE — Progress Notes (Signed)
Physical Therapy Session Note  Patient Details  Name: Courtney Burke MRN: [id number] Date of Birth: 11/28/1955  Today's Date: 07/21/2020 PT Individual Time: 0805-0901 PT Individual Time Calculation (min): 56 min   Short Term Goals: Week 2:  PT Short Term Goal 1 (Week 2): Pt will perform all functional transfers at supervision assist level. PT Short Term Goal 2 (Week 2): Pt will ambulate with LRAD at least 150 with supervision and improved RLE step-through pattern. PT Short Term Goal 3 (Week 2): Pt will ascend/descend 4 stairs with B rails and supervision.  Skilled Therapeutic Interventions/Progress Updates:   Pt received semireclined in bed, Eating breakfast. PT provided supervision assist for pt to complete meal with min cues for decreased bit size and improved awareness of pocketting. Upon completion of meal pt performed Supine>sit transfer with supervision assist and cues for use of rail as needed.   Stand pivot transfer to WC with RW and supervisision assist from PT for safety.  Sit<>stand also performed with supervision assist throughout entire session from various surfaces and min cues for safety awareness stability of WC prior to transfer.   Gait training with RW x 150ft and supervision assist from PT to improve step height and AD management in turns to keep BLE within BOS of RW. Dynamic gait training to weave through 8 cones with supervision assist and mod assist to improve step length on the RLE with turns to L, only mild improvement with instruction from PT.   Dynamic standing balance to perform lateral reach to the R to obtain and then toss bean bag to target on floor with supervision assist. Pt then engaged in picking up bean bags from floor x 10 with CGA from PT for safety 1 UE support on RW. Pt elected to use UE support outside base of RW, withno change in technique regardless of cues from PT.  Ball toss off rebounder forward and lateral R and L x 20 each direction with min assist  to prevent compensations and force trunkal rotation.  Foot tap on 6inch step x 10 BLE with supervision assist and BUE support on RW. Lateral step ups(6") x 8 Bil with BUe support on 1 rail; min assist throughout with cues for improved attention to and positioning RLE.   Patient returned to room and left sitting in WC with call bell in reach and all needs met.           Therapy Documentation Precautions:  Precautions Precautions: Fall Precaution Comments: globally aphasic, NG tube Restrictions Weight Bearing Restrictions: No    Pain: Faces: no pain   Therapy/Group: Individual Therapy  Austin E Tucker 07/21/2020, 9:03 AM  

## 2020-07-21 NOTE — Progress Notes (Signed)
Speech Language Pathology Daily Session Note  Patient Details  Name: Courtney Burke MRN: [id number] Date of Birth: 1955-09-02  Today's Date: 07/21/2020 SLP Individual Time: 7622-6333 SLP Individual Time Calculation (min): 55 min  Short Term Goals: Week 2: SLP Short Term Goal 1 (Week 2): STG=LTG due to remaining length of stay  Skilled Therapeutic Interventions: Pt was seen for skilled ST targeting dysphagia and communication goals. SLP facilitated session with skilled observation of pt consuming thin liquids, given that she was just recently upgraded. She consumed water via straw without any overt s/sx aspiration today. Recommend continue current diet. Pt demonstrated some improvements in writing skills today, able to copy several words (and verbalize them), whereas last week she required trace cues. When SLP erased her daughter and brother's names from white board, pt then able to write them again without cues. 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 When targeting CVC words, pt exhibited fewer neologisms and more semantic paraphasias than previous sessions, although neologisms still characterize the majority of her generative speech with the exception of highly familiar and automatic phrases. Provided Mod A orthographic and phonemic models, she produced CVC words with 75% accuracy. Pt became labile at end of session when SLP discussed pt's progress with her. Pt left laying in bed with alarm set and needs within reach. Continue per current plan of care.      Pain Pain Assessment Pain Scale: 0-10 Pain Score: 0-No pain  Therapy/Group: Individual Therapy  Arbutus Leas 07/21/2020, 7:23 AM

## 2020-07-21 NOTE — Progress Notes (Signed)
[facility] PHYSICAL MEDICINE & REHABILITATION PROGRESS NOTE  Subjective/Complaints:  Per PT, did ok with therapy this am  Remains severely aphasic Cal ct in progress per RD  ROS: limited due to language/communication    Objective: Vital Signs: Blood pressure 113/64, pulse 80, temperature 98 F (36.7 C), temperature source Oral, resp. rate 20, height 5\' 2"  (1.575 m), weight 90.8 kg, last menstrual period 09/28/2012, SpO2 100 %. DG Swallowing Func-Speech Pathology  Result Date: 07/19/2020 Objective Swallowing Evaluation: Type of Study: MBS-Modified Barium Swallow Study  Patient Details Name: Courtney Burke MRN: [id number] Date of Birth: Mar 26, 1956 Today's Date: 07/19/2020 Past Medical History: Past Medical History: Diagnosis Date . Asthma  . Borderline diabetes  . Hypertension  Past Surgical History: Past Surgical History: Procedure Laterality Date . ANKLE SURGERY   . CARPAL TUNNEL RELEASE   . carpel tunnel   . IR CT HEAD LTD  06/30/2020 . IR INTRA CRAN STENT  06/30/2020 . IR PERCUTANEOUS ART THROMBECTOMY/INFUSION INTRACRANIAL INC DIAG ANGIO  06/30/2020 . RADIOLOGY WITH ANESTHESIA N/A 06/30/2020  Procedure: RADIOLOGY WITH ANESTHESIA;  Surgeon: Radiologist, Medication, MD;  Location: [place];  Service: Radiology;  Laterality: N/A; HPI: Courtney Burke is a 65 y.o. 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 CT probable few small areas of acute left MCA infarction, less apparent. Ill-defined patchy hyperdensity may reflect contrast. Found to have M1 occlusion and underwent thrombectomy and intubated 1/27-1/28 am. Pt admitted to [facility] 07/09/20.  Assessment / Plan / Recommendation CHL IP CLINICAL IMPRESSIONS 07/19/2020 Clinical Impression Pt presents with mild oropharyngeal dysphagia, but excellent functional improvements since last MBSS performed 07/04/20. Although she took very large  consecutive sips despite cues to slow rate and use single sips, no instances of aspiration of thin barium was observed throughout multiple trials. Only intermittent flash penetration of thin barium observed (and mostly at the start of the study). However, 100% of penetrates remained far above the level of the vocal folds and were fully ejected from the vestibule during the swallow. Consumption via cup or straw did not impact her ability to protect her airway. Her swallow initiation was timely in 95% of instances, and even when swallow initiated at pyriform sinuses X1, laryngeal vestibule closure was effective to prevent intrusion. Pt exhibited functional mastication of dysphagia 3 (mechanical soft) solids, although piecemeal swallowing noted. She independently used extra swallows to clear min oral residue. When attempting to swallow barium pill with thin, pt unable to follow oral motor command to swallow as 1 cohesive bolus - instead she chewed the pill. Given results of today's study, recommend pt upgrade to dysphagia 3 (mechanical soft) textures, thin liquids, medications should be crushed in applesauce or pudding. Pt should still have full supervision during PO intake. Encourage pt to take small, slow sips of drinks and check for oral residue/pocketing, cue pt to use liquid washes to assist with full oral clearance. ST will continue to provide skilled interventions to ensure diet safety and efficiency and facilitate greater carryover of compensatory swallow strategies. SLP Visit Diagnosis Dysphagia, oropharyngeal phase (R13.12) Attention and concentration deficit following -- Frontal lobe and executive function deficit following -- Impact on safety and function Mild aspiration risk   CHL IP TREATMENT RECOMMENDATION 07/04/2020 Treatment Recommendations Therapy as outlined in treatment plan below   Prognosis 07/04/2020 Prognosis for Safe Diet Advancement Good Barriers to Reach Goals --  Barriers/Prognosis Comment --  CHL IP DIET RECOMMENDATION 07/19/2020 SLP Diet Recommendations Dysphagia 3 (Mech soft) solids;Thin liquid Liquid Administration via Cup;Straw Medication Administration Crushed with puree Compensations Slow rate;Small sips/bites;Minimize environmental distractions;Follow solids with liquid Postural Changes Remain semi-upright after after feeds/meals (Comment);Seated upright at 90 degrees   CHL IP OTHER RECOMMENDATIONS 07/19/2020 Recommended Consults -- Oral Care Recommendations Oral care BID Other Recommendations --   CHL IP FOLLOW UP RECOMMENDATIONS 07/07/2020 Follow up Recommendations Inpatient Rehab   CHL IP FREQUENCY AND DURATION 07/04/2020 Speech Therapy Frequency (ACUTE ONLY) min 2x/week Treatment Duration 2 weeks      CHL IP ORAL PHASE 07/19/2020 Oral Phase -- Oral - Pudding Teaspoon -- Oral - Pudding Cup -- Oral - Honey Teaspoon -- Oral - Honey Cup -- Oral - Nectar Teaspoon -- Oral - Nectar Cup NT Oral - Nectar Straw NT Oral - Thin Teaspoon -- Oral - Thin Cup Delayed oral transit Oral - Thin Straw Delayed oral transit Oral - Puree NT Oral - Mech Soft Piecemeal swallowing Oral - Regular -- Oral - Multi-Consistency -- Oral - Pill Decreased bolus cohesion;Delayed oral transit;Lingual/palatal residue;Other (Comment) Oral Phase - Comment --  CHL IP PHARYNGEAL PHASE 07/19/2020 Pharyngeal Phase Impaired Pharyngeal- Pudding Teaspoon -- Pharyngeal -- Pharyngeal- Pudding Cup -- Pharyngeal -- Pharyngeal- Honey Teaspoon -- Pharyngeal -- Pharyngeal- Honey Cup -- Pharyngeal -- Pharyngeal- Nectar Teaspoon -- Pharyngeal -- Pharyngeal- Nectar Cup NT Pharyngeal -- Pharyngeal- Nectar Straw NT Pharyngeal -- Pharyngeal- Thin Teaspoon -- Pharyngeal -- Pharyngeal- Thin Cup Penetration/Aspiration during swallow Pharyngeal Material enters airway, remains ABOVE vocal cords then ejected out Pharyngeal- Thin Straw WFL Pharyngeal Material does not enter airway Pharyngeal- Puree NT Pharyngeal -- Pharyngeal- Mechanical Soft WFL Pharyngeal --  Pharyngeal- Regular -- Pharyngeal -- Pharyngeal- Multi-consistency -- Pharyngeal -- Pharyngeal- Pill WFL Pharyngeal -- Pharyngeal Comment --  CHL IP CERVICAL ESOPHAGEAL PHASE 07/19/2020 Cervical Esophageal Phase WFL Pudding Teaspoon -- Pudding Cup -- Honey Teaspoon -- Honey Cup -- Nectar Teaspoon -- Nectar Cup -- Nectar Straw -- Thin Teaspoon -- Thin Cup -- Thin Straw -- Puree -- Mechanical Soft -- Regular -- Multi-consistency -- Pill -- Cervical Esophageal Comment -- Arbutus Leas 07/19/2020, 9:47 AM              No results for input(s): WBC, HGB, HCT, PLT in the last 72 hours. No results for input(s): NA, K, CL, CO2, GLUCOSE, BUN, CREATININE, CALCIUM in the last 72 hours.  Intake/Output Summary (Last 24 hours) at 07/21/2020 6578 Last data filed at 07/20/2020 1818 Gross per 24 hour  Intake 690 ml  Output --  Net 690 ml        Physical Exam: BP 113/64 (BP Location: Left Arm)   Pulse 80   Temp 98 F (36.7 C) (Oral)   Resp 20   Ht 5\' 2"  (1.575 m)   Wt 90.8 kg   LMP 09/28/2012   SpO2 100%   BMI 36.61 kg/m     General: No acute distress Mood and affect are appropriate Heart: Regular rate and rhythm no rubs murmurs or extra sounds Lungs: Clear to auscultation, breathing unlabored, no rales or wheezes Abdomen: Positive bowel sounds, soft nontender to palpation, nondistended Extremities: No clubbing, cyanosis, or edema Skin: No evidence of breakdown, no evidence of rash  4/5 R side, 5/5 left side  Neuro: Global aphasia- attempting to communicate thru words and short phrases Motor: Unable to follow commands consistently, cannot perform formal MMT but is antigravity with resistance in all 4 ext  Assessment/Plan: 1. Functional deficits which require 3+ hours per day of interdisciplinary therapy in a comprehensive inpatient rehab setting.  Physiatrist is providing close team supervision and 24 hour management of active medical problems listed below.  Physiatrist and rehab team  continue to assess barriers to discharge/monitor patient progress toward functional and medical goals   Care Tool:  Bathing    Body parts bathed by patient: Chest,Face,Front perineal area,Abdomen,Left arm,Right arm,Right upper leg,Left upper leg,Left lower leg,Right lower leg   Body parts bathed by helper: Buttocks     Bathing assist Assist Level: Minimal Assistance - Patient > 75%     Upper Body Dressing/Undressing Upper body dressing   What is the patient wearing?: Pull over shirt,Dress    Upper body assist Assist Level: Supervision/Verbal cueing    Lower Body Dressing/Undressing Lower body dressing      What is the patient wearing?: Pants     Lower body assist Assist for lower body dressing: Supervision/Verbal cueing     Toileting Toileting    Toileting assist Assist for toileting: Supervision/Verbal cueing     Transfers Chair/bed transfer  Transfers assist     Chair/bed transfer assist level: Supervision/Verbal cueing     Locomotion Ambulation   Ambulation assist      Assist level: Supervision/Verbal cueing Assistive device: Walker-rolling Max distance: 155'   Walk 10 feet activity   Assist     Assist level: Supervision/Verbal cueing Assistive device: Walker-rolling   Walk 50 feet activity   Assist Walk 50 feet with 2 turns activity did not occur: Safety/medical concerns  Assist level: Contact Guard/Touching assist Assistive device: Walker-rolling    Walk 150 feet activity   Assist Walk 150 feet activity did not occur: Safety/medical concerns  Assist level: Contact Guard/Touching assist Assistive device: Walker-rolling    Walk 10 feet on uneven surface  activity   Assist Walk 10 feet on uneven surfaces activity did not occur: Safety/medical concerns         Wheelchair     Assist Will patient use wheelchair at discharge?: No             Wheelchair 50 feet with 2 turns activity    Assist             Wheelchair 150 feet activity     Assist           Medical Problem List and Plan: 1.  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  Antithrombotics: -DVT/anticoagulation: Continue Lovenox             -antiplatelet therapy: Aspirin 81 mg daily and Brilinta 90 mg twice daily 3. Pain Management:  Reports headaches at times- continue Tylenol prn 4. Mood: Provide emotional support             -antipsychotic agents: N/A 5. Neuropsych: This patient is not capable of making decisions on her own behalf. 6. Skin/Wound Care: Routine skin checks 7. Fluids/Electrolytes/Nutrition: Routine and outs            CMP 2/7 reviewed and electrolytes are stable.  8.  Poststroke dysphagia:.  Dysphagia #2 nectar liquids.per MBS upgraded to D3 thin             Advance diet as tolerated   Feeding tube for supplemental feeds, intake at 50%- if this improves will d/c Feeding tube - appreciate RD assist 9.  Seizure  prophylaxis.  Keppra 500 mg twice daily.  EEG negative. D/c Keppra. Continues to be seizure free.   10.  Urinary retention: Continue Urecholine 10 mg 3 times daily.  PVR ok  11.  Diet-controlled diabetes mellitus.  Hemoglobin A1c 5.4.  CBGs discontinued 12.  Hyperlipidemia: Lipitor 13.  Morbid obesity status post gastric sleeve 05/24/2020.  Dietary follow-up 14.  CKD stage III.  Baseline creatinine 1.41-1.59             Cr 1.33 on 2/7 15. Abdominal pain, nausea, emesis:   2/9: stop TF, repeat KUB, zofran ordered, RD consult placed.   2/10: sx better with reduction in TF time/rate  2/11-13: improving, continue small meals, zofran PRN   -TF changed to HS     LOS: 12 days A FACE TO [place] E Donyale Berthold 07/21/2020, 8:32 AM

## 2020-07-22 LAB — GLUCOSE, CAPILLARY
Glucose-Capillary: 97 mg/dL (ref 70–99)
Glucose-Capillary: 150 mg/dL — ABNORMAL HIGH (ref 70–99)
Glucose-Capillary: 123 mg/dL — ABNORMAL HIGH (ref 70–99)
Glucose-Capillary: 91 mg/dL (ref 70–99)
Glucose-Capillary: 123 mg/dL — ABNORMAL HIGH (ref 70–99)

## 2020-07-22 NOTE — Progress Notes (Signed)
Occupational Therapy Session Note  Patient Details  Name: Courtney Burke MRN: [id number] Date of Birth: Mar 02, 1956  Today's Date: 07/22/2020 OT Individual Time: 5520-8022 OT Individual Time Calculation (min): 25 min and 53 min   Short Term Goals: Week 2:  OT Short Term Goal 1 (Week 2): STG = LTG 2/2 ELOS  Skilled Therapeutic Interventions/Progress Updates:    Session 1 (3361-2244): Pt received semi-reclined in bed, agreeable to therapy. No s/sx of pain. Supine > sitting EOB with use of bed rail and close S. Donned/doffed B shoes with distant S. LPN present to administer meds, but pt with episode of orange emesis immediately after. LPN present and aware, to notify MD/PA. STS and amb to bathroom to complete toilet transfer with CGA, close S for LB clothing management and peri care. Cont void of bladder. Small LOB to the L when exiting bathroom req min A to correct. Stood to brush teeth with close S. Doffed/donned shirt seated and completed hair care with distant S. Req to get back into bed. Stand-pivot from w/c with use of bed rail and close S. Pt left with HOB at 30 degrees with bed alarm engaged, call bell in reach, and all immediate needs met.    Session 2 (1010-1103): Pt received semi-reclined in bed with daughter present, agreeable to therapy. No s/sx of pain. Session focus on family ed re: TTB transfers, showering, dressing. Bed mobility with close S. Bed<> w/c transfer via ambulation with RW + close S throughout session. Transported to therapy apartment and simulated 2x TTB transfers in tub/shower with overall close S to [name] after initial demonstration. Daughter facilitated 1x transfer with good technique. Discussed fall risk reduction re: nonslip bath mats, removing rugs, drying off before exiting shower, etc. WC transport back to room. Pt completed shower with overall close S for TTB transfer and to bathe full-body with use of front grab bar. Completed full body dressing and seated grooming  with S. Daughter with questions regarding handicap parking sticker; SW made aware. With no further questions this time. Pt left in w/c with safety belt alarm engaged, call bell in reach, daughter present, and all immediate needs met.    Therapy Documentation Precautions:  Precautions Precautions: Fall Precaution Comments: globally aphasic, NG tube Restrictions Weight Bearing Restrictions: No Pain: Pain Assessment Pain Scale: Faces Pain Score: 0-No pain ADL: See Care Tool for more details. Therapy/Group: Individual Therapy  Volanda Napoleon MS, OTR/L  07/22/2020, 12:18 PM

## 2020-07-22 NOTE — Progress Notes (Signed)
Calorie Count Note: Day 2  72-hour calorie count ordered. Calorie count started 2/16 at lunch meal. Please see day 2 results below. RD will follow up on Monday (2/21) for day 3 results.  Diet: dysphagia 3, thin liquids Supplements:  - Boost Breeze po TID, each supplement provides 250 kcal and 9 grams of protein - ProSource Plus 30 ml po TID, each supplement provides 100 kcal and 15 grams of protein - Magic Cup TID with meals, each supplement provides 290 kcal and 9 grams of protein  Day 2: 2/17 Lunch: 128 kcal, 12 grams of protein 2/17 Dinner: 378 kcal, 16 grams of protien 2/18 Breakfast: 424 kcal, 15 grams of protein Supplements: 550 kcal, 54 grams of protein (3 ProSource Plus, 1 Boost Breeze)  Day 2 total 24-hour intake: 1480 kcal (93% of minimum estimated needs)  97 grams of protein (100% of minimum estimated needs)  Nutrition Diagnosis: Inadequate oral intakerelated to dysphagia,decreased appetiteas evidenced by meal completion < 50%.  Goal: Patient will meet greater than or equal to 90% of their needs  Intervention:  - Continue 72-hour calorie count - Continue ProSource Plus TID - Continue Boost Breeze TID - Continue Magic Cup TID - Continue MVI with minerals BID - Continue calcium citrate TID   Gustavus Bryant, MS, RD, LDN Inpatient Clinical Dietitian Please see AMi[facility] for contact information.

## 2020-07-22 NOTE — Plan of Care (Signed)
  Problem: Consults Goal: RH STROKE PATIENT EDUCATION Description: See Patient Education module for education specifics  Outcome: Progressing   Problem: RH BOWEL ELIMINATION Goal: RH STG MANAGE BOWEL WITH ASSISTANCE Description: STG Manage Bowel with Mod I Assistance. Outcome: Progressing Goal: RH STG MANAGE BOWEL W/MEDICATION W/ASSISTANCE Description: STG Manage Bowel with Medication with Mod I Assistance. Outcome: Progressing   Problem: RH BLADDER ELIMINATION Goal: RH STG MANAGE BLADDER WITH ASSISTANCE Description: STG Manage Bladder With Mod I Assistance Outcome: Progressing Goal: RH STG MANAGE BLADDER WITH MEDICATION WITH ASSISTANCE Description: STG Manage Bladder With Medication With Mod I Assistance. Outcome: Progressing   Problem: RH SAFETY Goal: RH STG ADHERE TO SAFETY PRECAUTIONS W/ASSISTANCE/DEVICE Description: STG Adhere to Safety Precautions With Mod I Assistance/Device. Outcome: Progressing Goal: RH STG DECREASED RISK OF FALL WITH ASSISTANCE Description: STG Decreased Risk of Fall With Mod I Assistance. Outcome: Progressing   Problem: RH COGNITION-NURSING Goal: RH STG USES MEMORY AIDS/STRATEGIES W/ASSIST TO PROBLEM SOLVE Description: STG Uses Memory Aids/Strategies With Mod I Assistance to Problem Solve. Outcome: Progressing Goal: RH STG ANTICIPATES NEEDS/CALLS FOR ASSIST W/ASSIST/CUES Description: STG Anticipates Needs/Calls for Assist With Mod I Assistance/Cues. Outcome: Progressing   Problem: RH PAIN MANAGEMENT Goal: RH STG PAIN MANAGED AT OR BELOW PT'S PAIN GOAL Description: Assess and treat pain q shift and as needed Outcome: Progressing

## 2020-07-22 NOTE — Progress Notes (Signed)
Speech Language Pathology Daily Session Note  Patient Details  Name: Courtney Burke MRN: [id number] Date of Birth: 04-18-1956  Today's Date: 07/22/2020 SLP Individual Time: 1300-1355 SLP Individual Time Calculation (min): 55 min  Short Term Goals: Week 2: SLP Short Term Goal 1 (Week 2): STG=LTG due to remaining length of stay  Skilled Therapeutic Interventions:  Pt was seen for skilled ST targeting family education.  [name] was present for the duration of today's therapy session and remained actively engaged in all training activities.  SLP provided skilled education regarding pt's currently prescribed diet, including foods allowed and foods to avoid.  SLP provided pt's daughter with a handout of recommended consistencies to maximize carryover in the home environment.  SLP instructed that if pt wanted to try foods that were beyond the recommended consistencies that they be trialed in small amounts with family present to ensure safety.  SLP also discussed recommended safe swallowing strategies of slow rate with thin liquids and check for pocketing with solids.  All questions related to pt's current swallowing function were answered to pt's daughter's satisfaction at this time.  SLP also provided skilled education related to pt's current status for functional communication, including explanation of aphasia and apraxia.  SLP provided examples of techniques to elicit language and maximize pt's independence for functional, multimodal communication, emphasizing allowing the pt extra time to communicate, providing semantic or phonemic cues when appropriate, and making environmental modifications to make functional communication easier for the pt.  An "Understanding Aphasia" handout was provided to maximize carryover in the home environment and all questions were answered to pt's daughter's satisfaction at this time.  [name] in agreement with recommendations for [name] ST follow up post discharge.  Pt  was left in bed with bed alarm set and call bell within reach.  Continue per current plan of care.    Pain Pain Assessment Pain Scale: 0-10 Pain Score: 0-No pain  Therapy/Group: Individual Therapy  Courtney Burke, Courtney Burke 07/22/2020, 2:37 PM

## 2020-07-22 NOTE — Progress Notes (Signed)
Physical Therapy Session Note  Patient Details  Name: Courtney Burke MRN: [id number] Date of Birth: [birth year]  Today's Date: 07/22/2020 PT Individual Time: 6986-1483 PT Individual Time Calculation (min): 53 min   Short Term Goals: Week 2:  PT Short Term Goal 1 (Week 2): Pt will perform all functional transfers at supervision assist level. PT Short Term Goal 2 (Week 2): Pt will ambulate with LRAD at least 150 with supervision and improved RLE step-through pattern. PT Short Term Goal 3 (Week 2): Pt will ascend/descend 4 stairs with B rails and supervision.  Skilled Therapeutic Interventions/Progress Updates:   Pt received sitting in WC and agreeable to PT. Daughter present for family education.   Pt transported to rehab gym in [facility]. Gait training with RW x 137f and supervision assist. Verbal instruction to pt to improve step length on the R and increase step height to reduce fall risk. Education provided to daughter for proper gaurding on the R side to reduce fall risk.   Daughter reports home access through ramp. Gait training to ascend/descent ramp xx 2 with supervision assist provided byt Pt then daughter with only mi cues for safety concerns and attention to improve step length on the R.   Car transfer training x 2; performed once with PT and then with daughter and supervision assist throughout transfer. Cues for sit>pivot technique to access car.    PT instructed pt in HEP with hand out provided of modified [place] level balance program. LAQ, hip abduction, HS curl, tandem stance, minisquat. Each completed x 10 BLE with BUE support for balance in standing.   Pt returned to room and performed stand pivot transfer to bed with supervision assist. Sit>supine completed with supervisoin assist  and left supine in bed with call bell in reach and all needs met.       Therapy Documentation Precautions:  Precautions Precautions: Fall Precaution Comments: globally aphasic, NG  tube Restrictions Weight Bearing Restrictions: No    Pain: Pain Assessment Pain Scale: Faces Pain Score: 0-No pain      Therapy/Group: Individual Therapy  ALorie Phenix2/18/2022, 1:04 PM

## 2020-07-22 NOTE — Progress Notes (Signed)
PHYSICAL MEDICINE & REHABILITATION PROGRESS NOTE  Subjective/Complaints:  Reviewed flowsheets, eating more than 50% meals yesterday but 50% breakfast this am .  Remains severely aphasic Cal ct in progress per RD  ROS: limited due to language/communication    Objective: Vital Signs: Blood pressure 113/64, pulse 70, temperature 98.8 F (37.1 C), resp. rate 16, height 5\' 2"  (1.575 m), weight 91.7 kg, last menstrual period 09/28/2012, SpO2 100 %. No results found. No results for input(s): WBC, HGB, HCT, PLT in the last 72 hours. No results for input(s): NA, K, CL, CO2, GLUCOSE, BUN, CREATININE, CALCIUM in the last 72 hours.  Intake/Output Summary (Last 24 hours) at 07/22/2020 0848 Last data filed at 07/21/2020 1816 Gross per 24 hour  Intake 200 ml  Output -  Net 200 ml        Physical Exam: BP 113/64 (BP Location: Left Arm)   Pulse 70   Temp 98.8 F (37.1 C)   Resp 16   Ht 5\' 2"  (1.575 m)   Wt 91.7 kg   LMP 09/28/2012   SpO2 100%   BMI 36.98 kg/m    General: No acute distress Mood and affect are appropriate Heart: Regular rate and rhythm no rubs murmurs or extra sounds Lungs: Clear to auscultation, breathing unlabored, no rales or wheezes Abdomen: Positive bowel sounds, soft nontender to palpation, nondistended Extremities: No clubbing, cyanosis, or edema Skin: No evidence of breakdown, no evidence of rash  4/5 R side, 5/5 left side  Neuro: Global aphasia- attempting to communicate thru words and short phrases Motor: Unable to follow commands consistently, cannot perform formal MMT but is antigravity with resistance in all 4 ext    Assessment/Plan: 1. Functional deficits which require 3+ hours per day of interdisciplinary therapy in a comprehensive inpatient rehab setting.  Physiatrist is providing close team supervision and 24 hour management of active medical problems listed below.  Physiatrist and rehab team continue to assess barriers to  discharge/monitor patient progress toward functional and medical goals   Care Tool:  Bathing    Body parts bathed by patient: Chest,Face,Front perineal area,Abdomen,Left arm,Right arm,Right upper leg,Left upper leg,Left lower leg,Right lower leg   Body parts bathed by helper: Buttocks     Bathing assist Assist Level: Minimal Assistance - Patient > 75%     Upper Body Dressing/Undressing Upper body dressing   What is the patient wearing?: Pull over shirt    Upper body assist Assist Level: Supervision/Verbal cueing    Lower Body Dressing/Undressing Lower body dressing      What is the patient wearing?: Pants     Lower body assist Assist for lower body dressing: Supervision/Verbal cueing     Toileting Toileting    Toileting assist Assist for toileting: Supervision/Verbal cueing     Transfers Chair/bed transfer  Transfers assist     Chair/bed transfer assist level: Supervision/Verbal cueing     Locomotion Ambulation   Ambulation assist      Assist level: Contact Guard/Touching assist Assistive device: Walker-rolling Max distance: 145   Walk 10 feet activity   Assist     Assist level: Contact Guard/Touching assist Assistive device: Walker-rolling   Walk 50 feet activity   Assist Walk 50 feet with 2 turns activity did not occur: Safety/medical concerns  Assist level: Contact Guard/Touching assist Assistive device: Walker-rolling    Walk 150 feet activity   Assist Walk 150 feet activity did not occur: Safety/medical concerns  Assist level: Contact Guard/Touching assist Assistive device: Walker-rolling  Walk 10 feet on uneven surface  activity   Assist Walk 10 feet on uneven surfaces activity did not occur: Safety/medical concerns         Wheelchair     Assist Will patient use wheelchair at discharge?: No             Wheelchair 50 feet with 2 turns activity    Assist            Wheelchair 150 feet  activity     Assist           Medical Problem List and Plan: 1.  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  Antithrombotics: -DVT/anticoagulation: Continue Lovenox             -antiplatelet therapy: Aspirin 81 mg daily and Brilinta 90 mg twice daily 3. Pain Management:  Reports headaches at times- continue Tylenol prn 4. Mood: Provide emotional support             -antipsychotic agents: N/A 5. Neuropsych: This patient is not capable of making decisions on her own behalf. 6. Skin/Wound Care: Routine skin checks 7. Fluids/Electrolytes/Nutrition: Routine and outs            CMP 2/7 reviewed and electrolytes are stable.  8.  Poststroke dysphagia:.  Dysphagia #2 nectar liquids.per MBS upgraded to D3 thin             Advance diet as tolerated   Feeding tube for supplemental feeds, intake at 50%- if this improves will d/c Feeding tube - appreciate RD assist- if day 2 cal ct much improved ok with D/C feeding tube 9.  Seizure prophylaxis.  Keppra 500 mg twice daily.  EEG negative. D/c Keppra. Continues to be seizure free.   10.  Urinary retention: Continue Urecholine 10 mg 3 times daily.  PVR ok  11.  Diet-controlled diabetes mellitus.  Hemoglobin A1c 5.4.  CBGs discontinued 12.  Hyperlipidemia: Lipitor 13.  Morbid obesity status post gastric sleeve 05/24/2020.  Dietary follow-up 14.  CKD stage III.  Baseline creatinine 1.41-1.59             Cr 1.33 on 2/7 15. Abdominal pain, nausea, emesis: resolved      LOS: 13 days A FACE TO [place] E Ezio Wieck 07/22/2020, 8:48 AM

## 2020-07-22 NOTE — Progress Notes (Signed)
Patient ID: Courtney Burke, female   DOB: [birth year], 65 y.o.   MRN: [id number]   Patient referral sent to [facility] for review.  [place], [place]

## 2020-07-23 LAB — GLUCOSE, CAPILLARY
Glucose-Capillary: 103 mg/dL — ABNORMAL HIGH (ref 70–99)
Glucose-Capillary: 103 mg/dL — ABNORMAL HIGH (ref 70–99)
Glucose-Capillary: 125 mg/dL — ABNORMAL HIGH (ref 70–99)
Glucose-Capillary: 141 mg/dL — ABNORMAL HIGH (ref 70–99)
Glucose-Capillary: 94 mg/dL (ref 70–99)
Glucose-Capillary: 99 mg/dL (ref 70–99)

## 2020-07-23 NOTE — Progress Notes (Signed)
[name] PHYSICAL MEDICINE & REHABILITATION PROGRESS NOTE  Subjective/Complaints: Eating better as per nursing Today is last day of calorie count She is eager to get NGT out Expressing herself much better!  ROS: limited due to language/communication    Objective: Vital Signs: Blood pressure 111/72, pulse 69, temperature 97.9 F (36.6 C), resp. rate 17, height 5\' 2"  (1.575 m), weight 92 kg, last menstrual period 09/28/2012, SpO2 100 %. No results found. No results for input(s): WBC, HGB, HCT, PLT in the last 72 hours. No results for input(s): NA, K, CL, CO2, GLUCOSE, BUN, CREATININE, CALCIUM in the last 72 hours.  Intake/Output Summary (Last 24 hours) at 07/23/2020 1505 Last data filed at 07/23/2020 0910 Gross per 24 hour  Intake 608 ml  Output 0 ml  Net 608 ml    Physical Exam: BP 111/72 (BP Location: Left Arm)   Pulse 69   Temp 97.9 F (36.6 C)   Resp 17   Ht 5\' 2"  (1.575 m)   Wt 92 kg   LMP 09/28/2012   SpO2 100%   BMI 37.10 kg/m   Gen: no distress, normal appearing HEENT: oral mucosa pink and moist, NCAT Cardio: Reg rate Chest: normal effort, normal rate of breathing Abd: soft, non-distended Ext: no edema Psych: pleasant, normal affect  4/5 R side, 5/5 left side  Neuro: Global aphasia- attempting to communicate thru words and short phrases Motor: Unable to follow commands consistently, cannot perform formal MMT but is antigravity with resistance in all 4 ext    Assessment/Plan: 1. Functional deficits which require 3+ hours per day of interdisciplinary therapy in a comprehensive inpatient rehab setting.  Physiatrist is providing close team supervision and 24 hour management of active medical problems listed below.  Physiatrist and rehab team continue to assess barriers to discharge/monitor patient progress toward functional and medical goals   Care Tool:  Bathing    Body parts bathed by patient: Chest,Face,Front perineal area,Abdomen,Left arm,Right  arm,Right upper leg,Left upper leg,Left lower leg,Right lower leg,Buttocks   Body parts bathed by helper: Buttocks     Bathing assist Assist Level: Supervision/Verbal cueing     Upper Body Dressing/Undressing Upper body dressing   What is the patient wearing?: Pull over shirt    Upper body assist Assist Level: Supervision/Verbal cueing    Lower Body Dressing/Undressing Lower body dressing      What is the patient wearing?: Pants,Underwear/pull up     Lower body assist Assist for lower body dressing: Supervision/Verbal cueing     Toileting Toileting    Toileting assist Assist for toileting: Supervision/Verbal cueing     Transfers Chair/bed transfer  Transfers assist     Chair/bed transfer assist level: Supervision/Verbal cueing     Locomotion Ambulation   Ambulation assist      Assist level: Supervision/Verbal cueing Assistive device: Walker-rolling Max distance: 150   Walk 10 feet activity   Assist     Assist level: Supervision/Verbal cueing Assistive device: Walker-rolling   Walk 50 feet activity   Assist Walk 50 feet with 2 turns activity did not occur: Safety/medical concerns  Assist level: Supervision/Verbal cueing Assistive device: Walker-rolling    Walk 150 feet activity   Assist Walk 150 feet activity did not occur: Safety/medical concerns  Assist level: Supervision/Verbal cueing Assistive device: Walker-rolling    Walk 10 feet on uneven surface  activity   Assist Walk 10 feet on uneven surfaces activity did not occur: Safety/medical concerns         Wheelchair  Assist Will patient use wheelchair at discharge?: No             Wheelchair 50 feet with 2 turns activity    Assist            Wheelchair 150 feet activity     Assist           Medical Problem List and Plan: 1.  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  Antithrombotics: -DVT/anticoagulation: Continue Lovenox             -antiplatelet therapy: Continue Aspirin 81 mg daily and Brilinta 90 mg twice daily 3. Pain Management:  Reports headaches at times- continue Tylenol prn 4. Mood: Provide emotional support             -antipsychotic agents: N/A 5. Neuropsych: This patient is not capable of making decisions on her own behalf. 6. Skin/Wound Care: Routine skin checks 7. Fluids/Electrolytes/Nutrition: Routine and outs            CMP 2/7 reviewed and electrolytes are stable.  8.  Poststroke dysphagia:.  Dysphagia #2 nectar liquids.per MBS upgraded to D3 thin             Advance diet as tolerated   Feeding tube for supplemental feeds, intake at 50%- if this improves will d/c Feeding tube - appreciate RD assist- if day 2 cal ct much improved ok with D/C feeding tube 9.  Seizure prophylaxis.  Keppra 500 mg twice daily.  EEG negative. D/c Keppra. Continues to be seizure free.   10.  Urinary retention: Continue Urecholine 10 mg 3 times daily.  PVR ok  11.  Diet-controlled diabetes mellitus.  Hemoglobin A1c 5.4.  CBGs discontinued 12.  Hyperlipidemia: Lipitor 13.  Morbid obesity status post gastric sleeve 05/24/2020.  Dietary follow-up 14.  CKD stage III.  Baseline creatinine 1.41-1.59             Cr 1.33 on 2/7 15. Abdominal pain, nausea, emesis: resolved 16. Constipation: currently on Miralax, Colace, and Senna-docusate: adjust as needed. Messaged [place] to see is she has been having regular BM.      LOS: 14 days A FACE TO FACE EVALUATION WAS PERFORMED  Courtney Burke 07/23/2020, 3:05 PM

## 2020-07-23 NOTE — Progress Notes (Signed)
Occupational Therapy Discharge Summary  Patient Details  Name: Courtney Burke MRN: [id number] Date of Birth: 1955/10/24  Patient has met 10 of 10 long term goals due to improved activity tolerance, improved balance, postural control, ability to compensate for deficits, functional use of  RIGHT upper extremity, improved attention, improved awareness and improved coordination.  Patient to discharge at overall Supervision level.  Patient's care partner is independent to provide the necessary physical and cognitive assistance at discharge.    Reasons goals not met: NA  Recommendation:  Patient will benefit from ongoing skilled OT services in home health setting to continue to advance functional skills in the area of BADL, iADL and Reduce care partner burden.  Equipment: 3in1, TTB  Reasons for discharge: treatment goals met and discharge from hospital  Patient/family agrees with progress made and goals achieved: Yes  OT Discharge Precautions/Restrictions  Precautions Precautions: Fall Precaution Comments: globally aphasic, NG tube Restrictions Weight Bearing Restrictions: No Pain Pain Assessment Pain Scale: Faces Pain Score: 0-No pain ADL ADL Eating: Supervision/safety Where Assessed-Eating: Edge of bed Grooming: Supervision/safety Where Assessed-Grooming: Standing at sink Upper Body Bathing: Supervision/safety Where Assessed-Upper Body Bathing: Shower Lower Body Bathing: Supervision/safety Where Assessed-Lower Body Bathing: Shower Upper Body Dressing: Supervision/safety Where Assessed-Upper Body Dressing: Edge of bed Lower Body Dressing: Supervision/safety Where Assessed-Lower Body Dressing: Edge of bed Toileting: Supervision/safety Where Assessed-Toileting: Glass blower/designer: Close supervision Toilet Transfer Method: Counselling psychologist: Grab bars,Bedside Neurosurgeon: Close supervison Clinical cytogeneticist Method: Counselling psychologist: Facilities manager: Close supervision Social research officer, government Method: Heritage manager: Gaffer Baseline Vision/History:  (Pt unable to state.) Patient Visual Report: Other (comment) (Pt unable to state.) Vision Assessment?: No apparent visual deficits (Not apparent deficits impairing BADL, unable to follow directions for formal vision assessment) Eye Alignment: Within Functional Limits Perception  Perception: Within Functional Limits Inattention/Neglect: Appears intact Praxis Praxis: Impaired (intact for BADL, impaired for complex IADL) Cognition Overall Cognitive Status: Within Functional Limits for tasks assessed (Difficult to formally assess 2/2 global aphasia, WFL for BADL) Arousal/Alertness: Awake/alert Orientation Level: Oriented X4 Attention: Sustained Sustained Attention: Appears intact Awareness Impairment: Emergent impairment Problem Solving: Impaired Problem Solving Impairment: Verbal complex;Functional complex (greatly improved from eval, remains min to mod impaired for complex IADL/abstract tasks) Safety/Judgment: Appears intact Sensation Sensation Light Touch: Appears Intact Hot/Cold: Appears Intact Proprioception: Appears Intact Stereognosis: Appears Intact Coordination Gross Motor Movements are Fluid and Coordinated: Yes Fine Motor Movements are Fluid and Coordinated: Yes Coordination and Movement Description: Greatly improved from eval, very mild R hemiparesis Finger Nose Finger Test: Does not under/overshoot target, normal speed 9 Hole Peg Test: L hand: 56 secs Motor  Motor Motor: Abnormal postural alignment and control;Hemiplegia Motor - Discharge Observations: very mild R hemiparesis Mobility  Bed Mobility Bed Mobility: Rolling Right;Sit to Supine;Right Sidelying to Sit;Left Sidelying to Sit;Supine to Sit (mod I with bed rail) Rolling Right: Independent with  assistive device Right Sidelying to Sit: Independent with assistive device Left Sidelying to Sit: Independent with assistive device Supine to Sit: Independent with assistive device Sit to Supine: Independent with assistive device Transfers Sit to Stand: Independent with assistive device Stand to Sit: Independent with assistive device  Trunk/Postural Assessment  Cervical Assessment Cervical Assessment: Exceptions to [facility] (forward head) Thoracic Assessment Thoracic Assessment: Exceptions to [facility] (rounded shoulders) Lumbar Assessment Lumbar Assessment: Exceptions to [facility] (posterior pelvic tilt) Postural Control Postural Control: Within Functional Limits  Balance Static Sitting Balance Static Sitting -  Balance Support: Feet supported Static Sitting - Level of Assistance: 6: Modified independent (Device/Increase time) Dynamic Sitting Balance Dynamic Sitting - Balance Support: During functional activity;Feet supported Dynamic Sitting - Level of Assistance: 5: Stand by assistance Static Standing Balance Static Standing - Balance Support: During functional activity Static Standing - Level of Assistance: 5: Stand by assistance Dynamic Standing Balance Dynamic Standing - Balance Support: During functional activity;Bilateral upper extremity supported Dynamic Standing - Level of Assistance: 5: Stand by assistance Extremity/Trunk Assessment RUE Assessment RUE Assessment: Within Functional Limits General Strength Comments: difficult to formally assess due to pts difficultly with following instruction outside of functional context LUE Assessment LUE Assessment: Within Functional Limits General Strength Comments: Difficult to formally assess due to pts difficultly with following instruction outside of the functional context   Volanda Napoleon MS, OTR/L  07/23/2020, 12:29 PM

## 2020-07-23 NOTE — Progress Notes (Signed)
Occupational Therapy Session Note  Patient Details  Name: Courtney Burke MRN: [id number] Date of Birth: 1955-09-22  Today's Date: 07/23/2020 OT Individual Time: 8412-8208 OT Individual Time Calculation (min): 68 min    Short Term Goals: Week 2:  OT Short Term Goal 1 (Week 2): STG = LTG 2/2 ELOS  Skilled Therapeutic Interventions/Progress Updates:    Session 1 (1388-7195): Pt received supine in bed, agreeable to therapy. No s/sx of pain. Bed mobility with distant S, STS and amb to bathroom to complete toilet transfer with RW + close S. Cont void of b/b. Close for standing clothing management/peri care. Brushed teeth and completed hair care standing at sink with close S. Seated, changed shirt with distant S. Donned/doffed B shoes with distant S. W/c transport to gym to focus on LUE NMR, func cognition, activity tolerance, and dynamic standing balance in prep for improved ADL/IADL performance. Completed 3x10 of the following with 1 kg medicine ball: bicep curls, chest press, overhead shoulder press, cross body chops, and B shoulder flexion and hold. Standing on balance mat, matched cards from knee and head height with overall min VC for accuracy and problem solving. Overall, demonstrating improved dynamic standing balance and activity tolerance to complete tasks. Finally, readministered the following assessments:  9HPT: L hand: 56 secs, improved from 2 min 49 secs  Hand dynameter: R hand: 15, 12, 19 (avg 15 lbs) L: 22, 24, 28 (avg 25 lbs) improved from 12 and 17 lbs respectively on eval, however, question if receptive language deficits impaired instructions to put forth full effort.  Amb back to room with RW + close S and transferred back to bed same manner as before. Pt left with HOB at 30 degrees with bed alarm engaged, call bell in reach, and all immediate needs met.    Therapy Documentation Precautions:  Precautions Precautions: Fall Precaution Comments: globally aphasic, NG  tube Restrictions Weight Bearing Restrictions: No Pain: Pain Assessment Pain Scale: Faces Pain Score: 0-No pain ADL: See Care Tool for more details.   Therapy/Group: Individual Therapy  Volanda Napoleon MS, OTR/L  07/23/2020, 9:30 AM

## 2020-07-23 NOTE — Progress Notes (Signed)
Physical Therapy Session Note  Patient Details  Name: Courtney Burke MRN: [id number] Date of Birth: 06-06-1955  Today's Date: 07/23/2020 PT Individual Time: 1030-1100 PT Individual Time Calculation (min): 30 min   Short Term Goals: Week 2:  PT Short Term Goal 1 (Week 2): Pt will perform all functional transfers at supervision assist level. PT Short Term Goal 2 (Week 2): Pt will ambulate with LRAD at least 150 with supervision and improved RLE step-through pattern. PT Short Term Goal 3 (Week 2): Pt will ascend/descend 4 stairs with B rails and supervision.      Skilled Therapeutic Interventions/Progress Updates:  Pt resting in bed.  She denied pain.  In bed with no rails, supine> sit with supervision.  In sitting EOB, pt donned bil slippers over non slip socks.  Stand pivot transfer with RW to wc close supervision.  Gait training on level tile x 150' including turns, close supervision.  Mod cues and demo to improve clearance RLE and lengthen R step, with limited carry-over.  Gait with RW to kick small cones with R foot to facilitate R hip and flexion, with improvement on 4/8 cones with demo by PT q cone.  Seated therapeutic activity to promote R trunk lenthgening and L trunk shortening,  without LE support to challenge balance; pt reached out of BOS with R hand for pegs to complete row of alternating colors.  Pt also scooted forward/backward in unsupported sitting for pelvic dissociation and core activation.  At end of session, pt requested getting back to bed.  Sit>supine with supervision, no railings.  HOB raised, alarm set and all needs left at hand.      Therapy Documentation Precautions:  Precautions Precautions: Fall Precaution Comments: globally aphasic, NG tube Restrictions Weight Bearing Restrictions: No        Therapy/Group: Individual Therapy  Shajuana Mclucas 07/23/2020, 12:20 PM

## 2020-07-23 NOTE — Plan of Care (Signed)
  Problem: Consults Goal: RH STROKE PATIENT EDUCATION Description: See Patient Education module for education specifics  Outcome: Progressing   Problem: RH BOWEL ELIMINATION Goal: RH STG MANAGE BOWEL WITH ASSISTANCE Description: STG Manage Bowel with Mod I Assistance. Outcome: Progressing Goal: RH STG MANAGE BOWEL W/MEDICATION W/ASSISTANCE Description: STG Manage Bowel with Medication with Mod I Assistance. Outcome: Progressing   Problem: RH BLADDER ELIMINATION Goal: RH STG MANAGE BLADDER WITH ASSISTANCE Description: STG Manage Bladder With Mod I Assistance Outcome: Progressing Goal: RH STG MANAGE BLADDER WITH MEDICATION WITH ASSISTANCE Description: STG Manage Bladder With Medication With Mod I Assistance. Outcome: Progressing   Problem: RH SAFETY Goal: RH STG ADHERE TO SAFETY PRECAUTIONS W/ASSISTANCE/DEVICE Description: STG Adhere to Safety Precautions With Mod I Assistance/Device. Outcome: Progressing Goal: RH STG DECREASED RISK OF FALL WITH ASSISTANCE Description: STG Decreased Risk of Fall With Mod I Assistance. Outcome: Progressing   Problem: RH COGNITION-NURSING Goal: RH STG USES MEMORY AIDS/STRATEGIES W/ASSIST TO PROBLEM SOLVE Description: STG Uses Memory Aids/Strategies With Mod I Assistance to Problem Solve. Outcome: Progressing Goal: RH STG ANTICIPATES NEEDS/CALLS FOR ASSIST W/ASSIST/CUES Description: STG Anticipates Needs/Calls for Assist With Mod I Assistance/Cues. Outcome: Progressing   Problem: RH PAIN MANAGEMENT Goal: RH STG PAIN MANAGED AT OR BELOW PT'S PAIN GOAL Description: Assess and treat pain q shift and as needed Outcome: Progressing

## 2020-07-24 LAB — GLUCOSE, CAPILLARY
Glucose-Capillary: 107 mg/dL — ABNORMAL HIGH (ref 70–99)
Glucose-Capillary: 155 mg/dL — ABNORMAL HIGH (ref 70–99)
Glucose-Capillary: 101 mg/dL — ABNORMAL HIGH (ref 70–99)
Glucose-Capillary: 83 mg/dL (ref 70–99)
Glucose-Capillary: 107 mg/dL — ABNORMAL HIGH (ref 70–99)

## 2020-07-24 NOTE — Progress Notes (Signed)
[facility] PHYSICAL MEDICINE & REHABILITATION PROGRESS NOTE  Subjective/Complaints: She has been eating much better Patient and daughter at bedside would like for NGT to be removed Calorie count reviewed and she is meeting needs- placed order for d/c of NGT She has no other complaints  ROS: limited due to language/communication    Objective: Vital Signs: Blood pressure 103/64, pulse 67, temperature 97.9 F (36.6 C), resp. rate 20, height 5\' 2"  (1.575 m), weight 88.9 kg, last menstrual period 09/28/2012, SpO2 100 %. No results found. No results for input(s): WBC, HGB, HCT, PLT in the last 72 hours. No results for input(s): NA, K, CL, CO2, GLUCOSE, BUN, CREATININE, CALCIUM in the last 72 hours.  Intake/Output Summary (Last 24 hours) at 07/24/2020 1028 Last data filed at 07/24/2020 0848 Gross per 24 hour  Intake 420 ml  Output --  Net 420 ml    Physical Exam: BP 103/64 (BP Location: Left Arm)   Pulse 67   Temp 97.9 F (36.6 C)   Resp 20   Ht 5\' 2"  (1.575 m)   Wt 88.9 kg   LMP 09/28/2012   SpO2 100%   BMI 35.85 kg/m   Gen: no distress, normal appearing HEENT: oral mucosa pink and moist, NCAT Cardio: Reg rate Chest: normal effort, normal rate of breathing Abd: soft, non-distended Ext: no edema Psych: pleasant, normal affect Skin: intact Neuro:  4/5 R side, 5/5 left side  Neuro: Global aphasia- attempting to communicate thru words and short phrases Motor: Unable to follow commands consistently, cannot perform formal MMT but is antigravity with resistance in all 4 ext    Assessment/Plan: 1. Functional deficits which require 3+ hours per day of interdisciplinary therapy in a comprehensive inpatient rehab setting.  Physiatrist is providing close team supervision and 24 hour management of active medical problems listed below.  Physiatrist and rehab team continue to assess barriers to discharge/monitor patient progress toward functional and medical goals   Care  Tool:  Bathing    Body parts bathed by patient: Chest,Face,Front perineal area,Abdomen,Left arm,Right arm,Right upper leg,Left upper leg,Left lower leg,Right lower leg,Buttocks   Body parts bathed by helper: Buttocks     Bathing assist Assist Level: Supervision/Verbal cueing     Upper Body Dressing/Undressing Upper body dressing   What is the patient wearing?: Pull over shirt    Upper body assist Assist Level: Supervision/Verbal cueing    Lower Body Dressing/Undressing Lower body dressing      What is the patient wearing?: Pants,Underwear/pull up     Lower body assist Assist for lower body dressing: Supervision/Verbal cueing     Toileting Toileting    Toileting assist Assist for toileting: Supervision/Verbal cueing     Transfers Chair/bed transfer  Transfers assist     Chair/bed transfer assist level: Supervision/Verbal cueing     Locomotion Ambulation   Ambulation assist      Assist level: Supervision/Verbal cueing Assistive device: Walker-rolling Max distance: 150   Walk 10 feet activity   Assist     Assist level: Supervision/Verbal cueing Assistive device: Walker-rolling   Walk 50 feet activity   Assist Walk 50 feet with 2 turns activity did not occur: Safety/medical concerns  Assist level: Supervision/Verbal cueing Assistive device: Walker-rolling    Walk 150 feet activity   Assist Walk 150 feet activity did not occur: Safety/medical concerns  Assist level: Supervision/Verbal cueing Assistive device: Walker-rolling    Walk 10 feet on uneven surface  activity   Assist Walk 10 feet on uneven  surfaces activity did not occur: Safety/medical concerns         Wheelchair     Assist Will patient use wheelchair at discharge?: No             Wheelchair 50 feet with 2 turns activity    Assist            Wheelchair 150 feet activity     Assist           Medical Problem List and Plan: 1.  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  Antithrombotics: -DVT/anticoagulation: Continue Lovenox             -antiplatelet therapy: Continue Aspirin 81 mg daily and Brilinta 90 mg twice daily 3. Pain Management:  Reports headaches at times- continue Tylenol prn 4. Mood: Provide emotional support             -antipsychotic agents: N/A 5. Neuropsych: This patient is not capable of making decisions on her own behalf. 6. Skin/Wound Care: Routine skin checks 7. Fluids/Electrolytes/Nutrition: Routine and outs            CMP 2/7 reviewed and electrolytes are stable.  8.  Poststroke dysphagia:.  Dysphagia #2 nectar liquids.per MBS upgraded to D3 thin             Advance diet as tolerated   Calorie count reviewed- meeting needs- NGT may be d/ced 9.  Seizure prophylaxis.  Keppra 500 mg twice daily.  EEG negative. D/c Keppra. Continues to be seizure free.   10.  Urinary retention: Continue Urecholine 10 mg 3 times daily.  PVR ok  11.  Diet-controlled diabetes mellitus.  Hemoglobin A1c 5.4.  CBGs discontinued 12.  Hyperlipidemia: Lipitor 13.  Morbid obesity status post gastric sleeve 05/24/2020.  Dietary follow-up 14.  CKD stage III.  Baseline creatinine 1.41-1.59             Cr 1.33 on 2/7 15. Abdominal pain, nausea, emesis: resolved 16. Constipation: currently on Miralax, Colace, and Senna-docusate: adjust as needed. Messaged T[facility]ka to see is she has been having regular BM.      LOS: 15 days A FACE TO FACE EVALUATION WAS PERFORMED  Courtney Burke 07/24/2020, 10:28 AM

## 2020-07-24 NOTE — Discharge Summary (Signed)
Physician Discharge Summary  Patient ID: Courtney Burke MRN: [id number] DOB/AGE: May 18, 1956 65 y.o.  Admit date: 07/09/2020 Discharge date: 07/26/2020  Discharge Diagnoses:  Principal Problem:   Left middle cerebral artery stroke [facility]) DVT prophylaxis Dysphagia Seizure prophylaxis Urinary retention Diet-controlled diabetes mellitus Morbid obesity status post gastric sleeve 05/24/2020 CKD stage III Constipation Hyperlipidemia  Discharged Condition:   Significant Diagnostic Studies: CT ABDOMEN PELVIS WO CONTRAST  Result Date: 07/02/2020 CLINICAL DATA:  Retroperitoneal hematoma.  Decreased hemoglobin. EXAM: CT ABDOMEN AND PELVIS WITHOUT CONTRAST TECHNIQUE: Multidetector CT imaging of the abdomen and pelvis was performed following the standard protocol without IV contrast. COMPARISON:  CT dated 07/14/2019 FINDINGS: Lower chest: There is atelectasis versus aspiration at the lung bases.The heart is enlarged. The intracardiac blood pool is hypodense relative to the adjacent myocardium consistent with anemia. Hepatobiliary: The liver is normal. Status post cholecystectomy.There is no biliary ductal dilation. Pancreas: Normal contours without ductal dilatation. No peripancreatic fluid collection. Spleen: Unremarkable. Adrenals/Urinary Tract: --Adrenal glands: Unremarkable. --Right kidney/ureter: No hydronephrosis or radiopaque kidney stones. --Left kidney/ureter: No hydronephrosis or radiopaque kidney stones. --Urinary bladder: There is a Foley catheter in place. Stomach/Bowel: --Stomach/Duodenum: Patient is status post prior gastric bypass. The enteric tube terminates in the proximal small bowel. --Small bowel: Unremarkable. --Colon: There is a large amount of stool at the level of the rectum. --Appendix: Not visualized. No right lower quadrant inflammation or free fluid. Vascular/Lymphatic: There is fat stranding in the right inguinal region with a small retroperitoneal hematoma on the right. --No  retroperitoneal lymphadenopathy. --No mesenteric lymphadenopathy. --No pelvic or inguinal lymphadenopathy. Reproductive: Unremarkable Other: No ascites or free air. There is a small fat containing umbilical hernia. Musculoskeletal. No acute displaced fractures. IMPRESSION: 1. Fat stranding in the right inguinal region with a small retroperitoneal hematoma on the right. 2. Cardiomegaly. 3. Anemia. 4. Bibasilar atelectasis versus aspiration. 5. Large amount of stool at the level of the rectum. Electronically Signed   By: Constance Holster M.D.   On: 07/02/2020 18:17   CT Code Stroke CTA Head W/WO contrast  Result Date: 06/30/2020 CLINICAL DATA:  65 year old female code stroke presentation with left MCA ASPECTS 6. Left gaze deviation. EXAM: CT ANGIOGRAPHY HEAD AND NECK CT PERFUSION BRAIN TECHNIQUE: Multidetector CT imaging of the head and neck was performed using the standard protocol during bolus administration of intravenous contrast. Multiplanar CT image reconstructions and MIPs were obtained to evaluate the vascular anatomy. Carotid stenosis measurements (when applicable) are obtained utilizing NASCET criteria, using the distal internal carotid diameter as the denominator. Multiphase CT imaging of the brain was performed following IV bolus contrast injection. Subsequent parametric perfusion maps were calculated using RAPID software. CONTRAST:  173mL OMNIPAQUE IOHEXOL 350 MG/ML SOLN COMPARISON:  Plain head CT today 0721 hours. [facility] brain MRI 02/15/2016 FINDINGS: CT Brain Perfusion Findings: ASPECTS: 6 CBF (<30%) Volume: 66mL (erroneous). Using CBF less than 38% 11 mL of parenchyma is detected which does partially correspond to the area of cytotoxic edema by plain CT. Perfusion (Tmax>6.0s) volume: 17mL, hypoperfusion index 0.1 but also might be spurrious. Mismatch Volume: Calculated is not accurate due to erroneously low infarct core, estimated penumbra given the  above is 60 to 67 mL. Infarction Location:Left MCA The above was discussed by telephone with Dr. Lesleigh Noe on 06/30/2020 at 07:49 . CTA NECK Skeleton: No acute osseous abnormality identified. Dystrophic/degenerative calcifications of the longus coli muscle insertion at C1-C2. Upper chest: Negative. Other neck: No acute finding. Incidental  contrast reflux into a left posterior neck vein. Aortic arch: Slightly bovine arch configuration. No arch atherosclerosis. Right carotid system: Mildly tortuous proximal right CCA. Moderate calcified plaque at the right ICA origin with less than 50 % stenosis with respect to the distal vessel. Mild tortuosity. Left carotid system: Patent, mildly tortuous left CCA. Mild to moderate calcified plaque at the posterior left ICA origin with less than 50 % stenosis with respect to the distal vessel. Mildly tortuous. Vertebral arteries: Negative proximal right subclavian artery and right vertebral artery origin. The right vertebral is mildly tortuous but patent to the skull base without stenosis. Moderate soft more than calcified plaque in the proximal left subclavian artery although no significant stenosis. Left vertebral artery origin remains normal. Codominant left vertebral artery is patent to the skull base without stenosis. CTA HEAD Posterior circulation: Mild right V4 calcified plaque. Patent distal vertebral arteries to the basilar with mild stenosis at the vertebrobasilar junction. Diminutive PICA. Left AICA appears dominant. Patent basilar artery is diminutive but without stenosis. Patent SCA origins with fetal type bilateral PCA origins. Patent basilar tip. Bilateral PCA branches are within normal limits. Anterior circulation: Both ICA siphons are patent. No significant siphon plaque or stenosis. Normal posterior communicating artery origins. Patent carotid termini although the left MCA origin is occluded (series 10, image 19). There is some left MCA branch reconstitution as  seen on series 13, image 43. Left ACA origin is normal. There is severe stenosis at the right ACA origin. Anterior communicating artery is diminutive. Bilateral A2 branches appear symmetric and within normal limits. Right MCA origin is patent but also irregular with mild stenosis. Right MCA M1 and right MCA bifurcation are patent, although with moderate stenosis at the posterior right MCA M2 origin (series 12, image 10). No right MCA branch occlusion identified. Venous sinuses: Early contrast timing, grossly patent. Anatomic variants: Fetal type PCA origins. Review of the MIP images confirms the above findings IMPRESSION: 1. Positive for emergent large vessel occlusion: Left MCA origin. Some left MCA branch reconstitution. 2. CT Perfusion underestimates infarct core (ASPECTS 6), which is estimated at 11-22 mL, subsequent estimated penumbra of 60 to 67 mL. 3. No other large vessel occlusion. But intracranial atherosclerosis with: - Severe stenosis Right ACA origin. - moderate stenosis Right MCA M2 branch origin. - mild stenosis Right MCA M1, also Vertebrobasilar junction. 4. Cervical carotid atherosclerosis without stenosis. #1 discussed by telephone with Dr. Lesleigh Noe on 06/30/2020 at 0732 hours. And Salient CTP findings discussed at 0749 hours. Electronically Signed   By: Genevie Ann M.D.   On: 06/30/2020 07:58   DG Abd 1 View  Result Date: 07/13/2020 CLINICAL DATA:  Abdominal pain EXAM: ABDOMEN - 1 VIEW COMPARISON:  07/11/2020 and prior. FINDINGS: Normal bowel gas pattern. Upper abdominal suture material and cholecystectomy clips. Enteric tube tip overlying the left hemiabdomen is unchanged in positioning. IMPRESSION: Unchanged positioning of enteric tube. Nonobstructive bowel gas pattern. Electronically Signed   By: Primitivo Gauze M.D.   On: 07/13/2020 11:35   DG Abd 1 View  Result Date: 07/11/2020 CLINICAL DATA:  Feeding tube placement. EXAM: ABDOMEN - 1 VIEW COMPARISON:  July 04, 2020. FINDINGS:  The bowel gas pattern is normal. Distal tip of feeding tube is seen in the left side of the abdomen most consistent with proximal small bowel loop in this patient with history of gastric bypass. No radio-opaque calculi or other significant radiographic abnormality are seen. IMPRESSION: Distal tip of feeding tube seen in left  side of the abdomen most consistent with proximal small bowel loop in this patient with history of gastric bypass. Electronically Signed   By: Marijo Conception M.D.   On: 07/11/2020 10:56   CT HEAD WO CONTRAST  Result Date: 07/02/2020 CLINICAL DATA:  Stroke follow-up.  Intracranial hemorrhage. EXAM: CT HEAD WITHOUT CONTRAST TECHNIQUE: Contiguous axial images were obtained from the base of the skull through the vertex without intravenous contrast. COMPARISON:  CT head 06/30/2020, MRI head 07/01/2020 FINDINGS: Brain: Left MCA infarct with hypodensity in the left temporal and parietal lobe as well as in the left basal ganglia involving the caudate and putamen. Small 1 cm hemorrhage left temporal lobe unchanged from prior CT and MRI. Small focus of subarachnoid hemorrhage in the left lateral temporal lobe which was present on the prior CT. Ventricle size normal. No midline shift. No new area of infarction. Vascular: Negative for hyperdense vessel Skull: Negative Sinuses/Orbits: Paranasal sinuses clear. NG tube in place. Negative orbit. Other: None IMPRESSION: Acute left MCA infarct unchanged from prior studies. Small amount of hemorrhage left temporal lobe also unchanged. No new area of hemorrhage or infarction. Electronically Signed   By: Franchot Gallo M.D.   On: 07/02/2020 17:49   CT HEAD WO CONTRAST  Result Date: 06/30/2020 CLINICAL DATA:  Stroke, left MCA occlusion post thrombectomy and stenting EXAM: CT HEAD WITHOUT CONTRAST TECHNIQUE: Contiguous axial images were obtained from the base of the skull through the vertex without intravenous contrast. COMPARISON:  Earlier same day FINDINGS:  Brain: Probable few patchy areas of loss of gray-white differentiation are identified in the left MCA territory. This is less apparent than on the prior study. There is some ill-defined patchy hyperdensity, most discrete in the superior left temporal lobe on axial series 3, image 14. There is no significant mass effect. No hydrocephalus. Small chronic right cerebellar infarct. Vascular: New stent of the left M1 MCA. Skull: Calvarium is unremarkable. Sinuses/Orbits: No acute finding. Other: None. IMPRESSION: Probable few small areas of acute left MCA infarction as before but less apparent. Ill-defined patchy hyperdensity may reflect contrast staining and/or reperfusion petechial hemorrhage. No significant mass effect. Electronically Signed   By: Macy Mis M.D.   On: 06/30/2020 16:41   CT Code Stroke CTA Neck W/WO contrast  Result Date: 06/30/2020 CLINICAL DATA:  65 year old female code stroke presentation with left MCA ASPECTS 6. Left gaze deviation. EXAM: CT ANGIOGRAPHY HEAD AND NECK CT PERFUSION BRAIN TECHNIQUE: Multidetector CT imaging of the head and neck was performed using the standard protocol during bolus administration of intravenous contrast. Multiplanar CT image reconstructions and MIPs were obtained to evaluate the vascular anatomy. Carotid stenosis measurements (when applicable) are obtained utilizing NASCET criteria, using the distal internal carotid diameter as the denominator. Multiphase CT imaging of the brain was performed following IV bolus contrast injection. Subsequent parametric perfusion maps were calculated using RAPID software. CONTRAST:  172mL OMNIPAQUE IOHEXOL 350 MG/ML SOLN COMPARISON:  Plain head CT today 0721 hours. [facility] brain MRI 02/15/2016 FINDINGS: CT Brain Perfusion Findings: ASPECTS: 6 CBF (<30%) Volume: 37mL (erroneous). Using CBF less than 38% 11 mL of parenchyma is detected which does partially correspond to the area of  cytotoxic edema by plain CT. Perfusion (Tmax>6.0s) volume: 27mL, hypoperfusion index 0.1 but also might be spurrious. Mismatch Volume: Calculated is not accurate due to erroneously low infarct core, estimated penumbra given the above is 60 to 67 mL. Infarction Location:Left MCA The above was discussed  by telephone with Dr. Lesleigh Noe on 06/30/2020 at 07:49 . CTA NECK Skeleton: No acute osseous abnormality identified. Dystrophic/degenerative calcifications of the longus coli muscle insertion at C1-C2. Upper chest: Negative. Other neck: No acute finding. Incidental contrast reflux into a left posterior neck vein. Aortic arch: Slightly bovine arch configuration. No arch atherosclerosis. Right carotid system: Mildly tortuous proximal right CCA. Moderate calcified plaque at the right ICA origin with less than 50 % stenosis with respect to the distal vessel. Mild tortuosity. Left carotid system: Patent, mildly tortuous left CCA. Mild to moderate calcified plaque at the posterior left ICA origin with less than 50 % stenosis with respect to the distal vessel. Mildly tortuous. Vertebral arteries: Negative proximal right subclavian artery and right vertebral artery origin. The right vertebral is mildly tortuous but patent to the skull base without stenosis. Moderate soft more than calcified plaque in the proximal left subclavian artery although no significant stenosis. Left vertebral artery origin remains normal. Codominant left vertebral artery is patent to the skull base without stenosis. CTA HEAD Posterior circulation: Mild right V4 calcified plaque. Patent distal vertebral arteries to the basilar with mild stenosis at the vertebrobasilar junction. Diminutive PICA. Left AICA appears dominant. Patent basilar artery is diminutive but without stenosis. Patent SCA origins with fetal type bilateral PCA origins. Patent basilar tip. Bilateral PCA branches are within normal limits. Anterior circulation: Both ICA siphons are  patent. No significant siphon plaque or stenosis. Normal posterior communicating artery origins. Patent carotid termini although the left MCA origin is occluded (series 10, image 19). There is some left MCA branch reconstitution as seen on series 13, image 43. Left ACA origin is normal. There is severe stenosis at the right ACA origin. Anterior communicating artery is diminutive. Bilateral A2 branches appear symmetric and within normal limits. Right MCA origin is patent but also irregular with mild stenosis. Right MCA M1 and right MCA bifurcation are patent, although with moderate stenosis at the posterior right MCA M2 origin (series 12, image 10). No right MCA branch occlusion identified. Venous sinuses: Early contrast timing, grossly patent. Anatomic variants: Fetal type PCA origins. Review of the MIP images confirms the above findings IMPRESSION: 1. Positive for emergent large vessel occlusion: Left MCA origin. Some left MCA branch reconstitution. 2. CT Perfusion underestimates infarct core (ASPECTS 6), which is estimated at 11-22 mL, subsequent estimated penumbra of 60 to 67 mL. 3. No other large vessel occlusion. But intracranial atherosclerosis with: - Severe stenosis Right ACA origin. - moderate stenosis Right MCA M2 branch origin. - mild stenosis Right MCA M1, also Vertebrobasilar junction. 4. Cervical carotid atherosclerosis without stenosis. #1 discussed by telephone with Dr. Lesleigh Noe on 06/30/2020 at 0732 hours. And Salient CTP findings discussed at 0749 hours. Electronically Signed   By: Genevie Ann M.D.   On: 06/30/2020 07:58   MR ANGIO HEAD WO CONTRAST  Result Date: 07/01/2020 CLINICAL DATA:  Stroke.  Post left MCA thrombectomy. EXAM: MRI HEAD WITHOUT CONTRAST MRA HEAD WITHOUT CONTRAST TECHNIQUE: Multiplanar, multiecho pulse sequences of the brain and surrounding structures were obtained without intravenous contrast. Angiographic images of the head were obtained using MRA technique without  contrast. COMPARISON:  CT head 06/30/2020.  CTA head 06/30/2020 FINDINGS: MRI HEAD FINDINGS Brain: Acute infarct left MCA territory. There is involvement of the left basal ganglia including the caudate and putamen. There is infarct involving the superior left temporal lobe as well as the insula and the left parietal lobe. Small area of hemorrhage is present within  the left posterior temporal lobe measuring approximately 1 cm. This was hyperdense on CT yesterday. Scattered small areas of acute infarct in the left frontal lobe. Small area of acute infarct in the right cerebellum and in the occipital white matter bilaterally. Ventricle size normal.  No midline shift.  No mass lesion. Vascular: Normal arterial flow voids Skull and upper cervical spine: Negative Sinuses/Orbits: Mild mucosal edema paranasal sinuses. Negative orbit Other: None MRA HEAD FINDINGS Decreased signal in the carotid bilaterally at the skull base is felt to be artifact. This area appears widely patent on CTA. Decreased signal left cavernous carotid also artifact. Both cavernous carotids are widely patent on CTA. Moderate stenosis proximal right A1 segment. Mild stenosis right M1. Moderate stenosis right MCA bifurcation. Interval placement of stent in the left middle cerebral artery. There is flow related signal in left MCA branches indicating the stent is patent. Both vertebral arteries patent to the basilar. Basilar widely patent. Right PICA patent. Left AICA patent. Superior cerebellar and posterior cerebral arteries patent bilaterally. Fetal origin left posterior cerebral artery. Right posterior communicating artery is patent. IMPRESSION: Acute infarct left MCA territory involving the basal ganglia as well as the left temporal, frontal, and parietal lobes. Small area of hemorrhage in the left posterior temporal lobe. Additional small areas of acute infarct in the occipital white matter bilaterally and right cerebellum. Left MCA stent appears  patent. Moderate intracranial atherosclerotic disease as above. Electronically Signed   By: Franchot Gallo M.D.   On: 07/01/2020 18:55   MR BRAIN WO CONTRAST  Result Date: 07/01/2020 CLINICAL DATA:  Stroke.  Post left MCA thrombectomy. EXAM: MRI HEAD WITHOUT CONTRAST MRA HEAD WITHOUT CONTRAST TECHNIQUE: Multiplanar, multiecho pulse sequences of the brain and surrounding structures were obtained without intravenous contrast. Angiographic images of the head were obtained using MRA technique without contrast. COMPARISON:  CT head 06/30/2020.  CTA head 06/30/2020 FINDINGS: MRI HEAD FINDINGS Brain: Acute infarct left MCA territory. There is involvement of the left basal ganglia including the caudate and putamen. There is infarct involving the superior left temporal lobe as well as the insula and the left parietal lobe. Small area of hemorrhage is present within the left posterior temporal lobe measuring approximately 1 cm. This was hyperdense on CT yesterday. Scattered small areas of acute infarct in the left frontal lobe. Small area of acute infarct in the right cerebellum and in the occipital white matter bilaterally. Ventricle size normal.  No midline shift.  No mass lesion. Vascular: Normal arterial flow voids Skull and upper cervical spine: Negative Sinuses/Orbits: Mild mucosal edema paranasal sinuses. Negative orbit Other: None MRA HEAD FINDINGS Decreased signal in the carotid bilaterally at the skull base is felt to be artifact. This area appears widely patent on CTA. Decreased signal left cavernous carotid also artifact. Both cavernous carotids are widely patent on CTA. Moderate stenosis proximal right A1 segment. Mild stenosis right M1. Moderate stenosis right MCA bifurcation. Interval placement of stent in the left middle cerebral artery. There is flow related signal in left MCA branches indicating the stent is patent. Both vertebral arteries patent to the basilar. Basilar widely patent. Right PICA patent.  Left AICA patent. Superior cerebellar and posterior cerebral arteries patent bilaterally. Fetal origin left posterior cerebral artery. Right posterior communicating artery is patent. IMPRESSION: Acute infarct left MCA territory involving the basal ganglia as well as the left temporal, frontal, and parietal lobes. Small area of hemorrhage in the left posterior temporal lobe. Additional small areas of acute infarct  in the occipital white matter bilaterally and right cerebellum. Left MCA stent appears patent. Moderate intracranial atherosclerotic disease as above. Electronically Signed   By: Franchot Gallo M.D.   On: 07/01/2020 18:55   IR Intra Cran Stent  Result Date: 07/04/2020 INDICATION: Aphasia, left gaze deviation with right-sided weakness. 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 EXAM: 1. EMERGENT LARGE VESSEL OCCLUSION THROMBOLYSIS anterior CIRCULATION) COMPARISON:  CT angiogram of the head and neck of June 30, 2020. MEDICATIONS: Ancef 2 g IV antibiotic was administered within 1 hour of the procedure. ANESTHESIA/SEDATION: General anesthesia CONTRAST:  Isovue 300 approximately 120 mL FLUOROSCOPY TIME:  Fluoroscopy Time: 15 minutes 18 seconds (2667 mGy). COMPLICATIONS: None immediate. TECHNIQUE: Following a full explanation of the procedure along with the potential associated complications, an informed witnessed consent was obtained from the patient's daughter. The risks of intracranial hemorrhage of 10%, worsening neurological deficit, ventilator dependency, death and inability to revascularize were all reviewed in detail with the patient's daughter. The patient was then put under general anesthesia by the Department of Anesthesiology at [facility]. The right groin was prepped and draped in the usual sterile fashion. Thereafter using modified Seldinger technique, transfemoral access into the right common femoral artery was obtained without difficulty.  Over a 0.035 inch guidewire an 8 [place] from 25 cm Pinnacle sheath was inserted. Through this, and also over a 0.035 inch guidewire a combination of Simmons 2 5.5 [place] support catheter inside of 087 balloon guide catheter combination was advanced to the aortic arch, and cannulation was performed of the left common carotid artery. Over a 0.035 inch glide guidewire, the support catheter, and the 087 balloon guidewire were advanced to the left common carotid artery bifurcation. The guidewire and support Simmons 2 were removed. Good aspiration obtained from the hub of the balloon guide catheter. Control arteriogram performed through the balloon guide in the left common carotid artery was performed. FINDINGS: The left common carotid arteriogram demonstrates the left external carotid artery and its major branches to be widely patent. The left internal carotid artery at the bulb to the cranial skull base demonstrates wide patency with moderate tortuosity in its mid cervical segment. The petrous, cavernous and supraclinoid segments demonstrate wide patency. 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 The left anterior cerebral artery demonstrates approximately 50% stenosis at its A1 segment. However, flow is noted distally into the left anterior cerebral artery distribution. Flash filling of the left posterior communicating artery is also demonstrated. PROCEDURE: Through the balloon guide catheter in the proximal left internal carotid artery, a combination of an 014 inch standard Synchro micro guidewire with an 021 Headway microcatheter and an 071 136 cm Zoom aspiration catheter was advanced as the combination to the supraclinoid left ICA. The balloon guide was advanced further distally into the distal cervical left ICA. Using a torque device, access was obtained into the occluded left middle cerebral artery into the inferior division branch M2 M3 region followed by the  microcatheter. The guidewire was removed. Good aspiration obtained from the tip of the microcatheter. A gentle control arteriogram performed through microcatheter demonstrated safe position of the tip of the microcatheter which was now connected to continuous heparinized saline infusion. An [place] retrieval device was then advanced to the distal end of the microcatheter. The O ring on the delivery microcatheter was loosened. The Tiger retrieval device was retrieved and deployed such that the proximal marker  was just inside the proximal portion of the occluded left middle cerebral artery. The 071 aspiration catheter was advanced to just proximal to the origin of the right middle cerebral artery. Thereafter the retrieval device was expanded and decreased in size multiple times. 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 The retrieval device was again expanded right at the proximal aspect of the occlusion. Microcatheter was locked into position. Thereafter as constant aspiration was applied at the hub of the aspiration catheter at the origin of the left middle cerebral artery, and with the a 20 mL syringe at the hub of the balloon guide catheter in the left internal carotid artery with proximal occlusion for about 2 minutes, the combination of the retrieval device, the microcatheter and the Zoom aspiration device were retrieved and removed. Following reversal of flow arrest, control arteriogram performed through the balloon guide in the left internal carotid artery now demonstrated complete revascularization of the left middle cerebral artery distribution with a TICI 3 revascularization. However, this also unmasked a severe high-grade stenosis at the left MCA origin. At this point through the balloon guide in the left internal carotid  artery, a combination of a 5 [place] Catalyst guide catheter inside of which was the 021 microcatheter was advanced over a 0.014 inch standard Synchro micro guidewire to the supraclinoid left ICA. At this time there was complete occlusion of the left middle cerebral artery due to recoil phenomenon due to the atherosclerotic plaque. Micro guidewire was then gently advanced through the occluded left middle cerebral artery without difficulty in the superior division followed by the microcatheter. The guidewire was removed. Good aspiration obtained from the hub of the microcatheter. A gentle control arteriogram performed through this demonstrated safe position of the tip of the microcatheter. This in turn was then replaced with an 014 inch supported 300 cm Synchro micro guidewire with a J-tip configuration under constant fluoroscopic guidance. The tip of the exchange micro guidewire was maintained as the microcatheter was removed. Measurements were then performed of the left middle cerebral artery in its most normal segment just distal to the occlusion. It was elected to proceed with placement of a 2.25 mm x 12 mm Synergy drug-eluting stent. This was a retrogradely prepped with heparinized saline infusion, and also with 50% contrast and 50% heparinized saline infusion. Using the rapid exchange technique, the stent delivery system was advanced without difficulty to the supraclinoid left ICA. The delivery of the stent was then advanced without difficulty through the occluded left middle cerebral artery. Its proximal marker was positioned just proximal to the occluded left middle cerebral artery proximally. Thereafter, a control inflation was then performed using micro inflation syringe device via micro tubing with the balloon inflated to approximately 2.1 mm where it was maintained for approximately 20 seconds. Thereafter, with the wire distal, the balloon was retrieved and removed. A control arteriogram performed through  the 5 [place] guide catheter in the left internal carotid artery demonstrated complete angiographic revascularization of the left middle cerebral artery distribution achieving a TICI 3 revascularization. Also noted now was patency of the left posterior cerebral artery distribution via the posterior communicating artery. Control arteriograms were then performed at 15 and 30 minutes post deployment of the stent which continued to demonstrate excellent flow through the stented segment also with patency of the superior and inferior division branches into the more distal M4  and M5 regions of the left middle cerebral artery distribution. Final control arteriogram performed through the balloon guide in the left internal carotid artery after removal of the 5 [place] Catalyst guide catheter, and also the exchange micro guidewire demonstrated continued excellent apposition and patency of the angioplastied segment of the left middle cerebral artery with no intra stent abnormalities. A distal perfusion was now noted into the M4 M5 regions. Moderate spasm at the middle cervical left ICA responded to 25 mcg of nitroglycerin intra-arterially. The left anterior cerebral artery remained widely patent with cross filling of the right anterior cerebral A2 segment as described earlier. Balloon guide was removed. The Pinnacle sheath was removed with successful hemostasis with an 8 French Angio-Seal closure device. Distal pulses remained Dopplerable in both feet unchanged. Patient was left intubated to protect her airway as per anesthesia. An immediate CT scan of the brain performed on the table demonstrated contrast stain in the left basal ganglia region, and also the left parietal subcortical area. Patient was loaded with aspirin 81 mg, and Brilinta 180 mg p.o. via an orogastric tube just prior to the balloon angioplasty. A loading dose of cangrelor IV was given right after placement of the stent, with a 4 hr low-dose infusion with a CT  of the brain to follow. The patient's pupils were approximately 2 mm bilaterally though sluggish. The patient was then transferred to the PACU and then neuro ICU for post revascularization treatment. IMPRESSION: Status post endovascular complete revascularization of occluded left middle cerebral artery M1 segment with 1 pass with the Tiger 17 retrieval device and proximal aspiration followed by placement of a 2.25 mm x 12 mm Synergy balloon mounted drug-eluting stent with achievement of a TICI 3 revascularization. PLAN: Follow-up in the clinic approximately 4 weeks post discharge. Electronically Signed   By: Luanne Bras M.D.   On: 07/01/2020 12:48   IR CT Head Ltd  Result Date: 07/04/2020 INDICATION: Aphasia, left gaze deviation with right-sided weakness. 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 EXAM: 1. EMERGENT LARGE VESSEL OCCLUSION THROMBOLYSIS anterior CIRCULATION) COMPARISON:  CT angiogram of the head and neck of June 30, 2020. MEDICATIONS: Ancef 2 g IV antibiotic was administered within 1 hour of the procedure. ANESTHESIA/SEDATION: General anesthesia CONTRAST:  Isovue 300 approximately 120 mL FLUOROSCOPY TIME:  Fluoroscopy Time: 15 minutes 18 seconds (2667 mGy). COMPLICATIONS: None immediate. TECHNIQUE: Following a full explanation of the procedure along with the potential associated complications, an informed witnessed consent was obtained from the patient's daughter. The risks of intracranial hemorrhage of 10%, worsening neurological deficit, ventilator dependency, death and inability to revascularize were all reviewed in detail with the patient's daughter. The patient was then put under general anesthesia by the Department of Anesthesiology at [facility]. The right groin was prepped and draped in the usual sterile fashion. Thereafter using modified Seldinger technique, transfemoral access into the right common femoral artery was obtained  without difficulty. Over a 0.035 inch guidewire an 8 [place] from 25 cm Pinnacle sheath was inserted. Through this, and also over a 0.035 inch guidewire a combination of Simmons 2 5.5 [place] support catheter inside of 087 balloon guide catheter combination was advanced to the aortic arch, and cannulation was performed of the left common carotid artery. Over a 0.035 inch glide guidewire, the support catheter, and the 087 balloon guidewire were advanced to the left common carotid artery bifurcation. The guidewire and support Simmons 2 were removed. Good aspiration  obtained from the hub of the balloon guide catheter. Control arteriogram performed through the balloon guide in the left common carotid artery was performed. FINDINGS: The left common carotid arteriogram demonstrates the left external carotid artery and its major branches to be widely patent. The left internal carotid artery at the bulb to the cranial skull base demonstrates wide patency with moderate tortuosity in its mid cervical segment. The petrous, cavernous and supraclinoid segments demonstrate wide patency. 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 The left anterior cerebral artery demonstrates approximately 50% stenosis at its A1 segment. However, flow is noted distally into the left anterior cerebral artery distribution. Flash filling of the left posterior communicating artery is also demonstrated. PROCEDURE: Through the balloon guide catheter in the proximal left internal carotid artery, a combination of an 014 inch standard Synchro micro guidewire with an 021 [place] microcatheter and an 071 136 cm Zoom aspiration catheter was advanced as the combination to the supraclinoid left ICA. The balloon guide was advanced further distally into the distal cervical left ICA. Using a torque device, access was obtained into the occluded left middle cerebral artery into the inferior division branch M2 M3 region  followed by the microcatheter. The guidewire was removed. Good aspiration obtained from the tip of the microcatheter. A gentle control arteriogram performed through microcatheter demonstrated safe position of the tip of the microcatheter which was now connected to continuous heparinized saline infusion. An [place] retrieval device was then advanced to the distal end of the microcatheter. The O ring on the delivery microcatheter was loosened. The Tiger retrieval device was retrieved and deployed such that the proximal marker was just inside the proximal portion of the occluded left middle cerebral artery. The 071 aspiration catheter was advanced to just proximal to the origin of the right middle cerebral artery. Thereafter the retrieval device was expanded and decreased in size multiple times. 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 The retrieval device was again expanded right at the proximal aspect of the occlusion. Microcatheter was locked into position. Thereafter as constant aspiration was applied at the hub of the aspiration catheter at the origin of the left middle cerebral artery, and with the a 20 mL syringe at the hub of the balloon guide catheter in the left internal carotid artery with proximal occlusion for about 2 minutes, the combination of the retrieval device, the microcatheter and the Zoom aspiration device were retrieved and removed. Following reversal of flow arrest, control arteriogram performed through the balloon guide in the left internal carotid artery now demonstrated complete revascularization of the left middle cerebral artery distribution with a TICI 3 revascularization. However, this also unmasked a severe high-grade stenosis at the left MCA origin. At this point through the balloon guide in the left  internal carotid artery, a combination of a 5 [place] Catalyst guide catheter inside of which was the 021 microcatheter was advanced over a 0.014 inch standard Synchro micro guidewire to the supraclinoid left ICA. At this time there was complete occlusion of the left middle cerebral artery due to recoil phenomenon due to the atherosclerotic plaque. Micro guidewire was then gently advanced through the occluded left middle cerebral artery without difficulty in the superior division followed by the microcatheter. The guidewire was removed. Good aspiration obtained from the hub of the microcatheter. A gentle control arteriogram performed  through this demonstrated safe position of the tip of the microcatheter. This in turn was then replaced with an 014 inch supported 300 cm Synchro micro guidewire with a J-tip configuration under constant fluoroscopic guidance. The tip of the exchange micro guidewire was maintained as the microcatheter was removed. Measurements were then performed of the left middle cerebral artery in its most normal segment just distal to the occlusion. It was elected to proceed with placement of a 2.25 mm x 12 mm Synergy drug-eluting stent. This was a retrogradely prepped with heparinized saline infusion, and also with 50% contrast and 50% heparinized saline infusion. Using the rapid exchange technique, the stent delivery system was advanced without difficulty to the supraclinoid left ICA. The delivery of the stent was then advanced without difficulty through the occluded left middle cerebral artery. Its proximal marker was positioned just proximal to the occluded left middle cerebral artery proximally. Thereafter, a control inflation was then performed using micro inflation syringe device via micro tubing with the balloon inflated to approximately 2.1 mm where it was maintained for approximately 20 seconds. Thereafter, with the wire distal, the balloon was retrieved and removed. A control arteriogram  performed through the 5 [place] guide catheter in the left internal carotid artery demonstrated complete angiographic revascularization of the left middle cerebral artery distribution achieving a TICI 3 revascularization. Also noted now was patency of the left posterior cerebral artery distribution via the posterior communicating artery. Control arteriograms were then performed at 15 and 30 minutes post deployment of the stent which continued to demonstrate excellent flow through the stented segment also with patency of the superior and inferior division branches into the more distal M4 and M5 regions of the left middle cerebral artery distribution. Final control arteriogram performed through the balloon guide in the left internal carotid artery after removal of the 5 [place] Catalyst guide catheter, and also the exchange micro guidewire demonstrated continued excellent apposition and patency of the angioplastied segment of the left middle cerebral artery with no intra stent abnormalities. A distal perfusion was now noted into the M4 M5 regions. Moderate spasm at the middle cervical left ICA responded to 25 mcg of nitroglycerin intra-arterially. The left anterior cerebral artery remained widely patent with cross filling of the right anterior cerebral A2 segment as described earlier. Balloon guide was removed. The Pinnacle sheath was removed with successful hemostasis with an 8 French Angio-Seal closure device. Distal pulses remained Dopplerable in both feet unchanged. Patient was left intubated to protect her airway as per anesthesia. An immediate CT scan of the brain performed on the table demonstrated contrast stain in the left basal ganglia region, and also the left parietal subcortical area. Patient was loaded with aspirin 81 mg, and Brilinta 180 mg p.o. via an orogastric tube just prior to the balloon angioplasty. A loading dose of cangrelor IV was given right after placement of the stent, with a 4 hr low-dose  infusion with a CT of the brain to follow. The patient's pupils were approximately 2 mm bilaterally though sluggish. The patient was then transferred to the PACU and then neuro ICU for post revascularization treatment. IMPRESSION: Status post endovascular complete revascularization of occluded left middle cerebral artery M1 segment with 1 pass with the Tiger 17 retrieval device and proximal aspiration followed by placement of a 2.25 mm x 12 mm Synergy balloon mounted drug-eluting stent with achievement of a TICI 3 revascularization. PLAN: Follow-up in the clinic approximately 4 weeks post discharge. Electronically Signed   By: Willaim Rayas  Deveshwar M.D.   On: 07/01/2020 12:48   CT Code Stroke Cerebral Perfusion with contrast  Result Date: 06/30/2020 CLINICAL DATA:  65 year old female code stroke presentation with left MCA ASPECTS 6. Left gaze deviation. EXAM: CT ANGIOGRAPHY HEAD AND NECK CT PERFUSION BRAIN TECHNIQUE: Multidetector CT imaging of the head and neck was performed using the standard protocol during bolus administration of intravenous contrast. Multiplanar CT image reconstructions and MIPs were obtained to evaluate the vascular anatomy. Carotid stenosis measurements (when applicable) are obtained utilizing NASCET criteria, using the distal internal carotid diameter as the denominator. Multiphase CT imaging of the brain was performed following IV bolus contrast injection. Subsequent parametric perfusion maps were calculated using RAPID software. CONTRAST:  111mL OMNIPAQUE IOHEXOL 350 MG/ML SOLN COMPARISON:  Plain head CT today 0721 hours. [facility] brain MRI 02/15/2016 FINDINGS: CT Brain Perfusion Findings: ASPECTS: 6 CBF (<30%) Volume: 16mL (erroneous). Using CBF less than 38% 11 mL of parenchyma is detected which does partially correspond to the area of cytotoxic edema by plain CT. Perfusion (Tmax>6.0s) volume: 67mL, hypoperfusion index 0.1 but also might be  spurrious. Mismatch Volume: Calculated is not accurate due to erroneously low infarct core, estimated penumbra given the above is 60 to 67 mL. Infarction Location:Left MCA The above was discussed by telephone with Dr. Lesleigh Noe on 06/30/2020 at 07:49 . CTA NECK Skeleton: No acute osseous abnormality identified. Dystrophic/degenerative calcifications of the longus coli muscle insertion at C1-C2. Upper chest: Negative. Other neck: No acute finding. Incidental contrast reflux into a left posterior neck vein. Aortic arch: Slightly bovine arch configuration. No arch atherosclerosis. Right carotid system: Mildly tortuous proximal right CCA. Moderate calcified plaque at the right ICA origin with less than 50 % stenosis with respect to the distal vessel. Mild tortuosity. Left carotid system: Patent, mildly tortuous left CCA. Mild to moderate calcified plaque at the posterior left ICA origin with less than 50 % stenosis with respect to the distal vessel. Mildly tortuous. Vertebral arteries: Negative proximal right subclavian artery and right vertebral artery origin. The right vertebral is mildly tortuous but patent to the skull base without stenosis. Moderate soft more than calcified plaque in the proximal left subclavian artery although no significant stenosis. Left vertebral artery origin remains normal. Codominant left vertebral artery is patent to the skull base without stenosis. CTA HEAD Posterior circulation: Mild right V4 calcified plaque. Patent distal vertebral arteries to the basilar with mild stenosis at the vertebrobasilar junction. Diminutive PICA. Left AICA appears dominant. Patent basilar artery is diminutive but without stenosis. Patent SCA origins with fetal type bilateral PCA origins. Patent basilar tip. Bilateral PCA branches are within normal limits. Anterior circulation: Both ICA siphons are patent. No significant siphon plaque or stenosis. Normal posterior communicating artery origins. Patent  carotid termini although the left MCA origin is occluded (series 10, image 19). There is some left MCA branch reconstitution as seen on series 13, image 43. Left ACA origin is normal. There is severe stenosis at the right ACA origin. Anterior communicating artery is diminutive. Bilateral A2 branches appear symmetric and within normal limits. Right MCA origin is patent but also irregular with mild stenosis. Right MCA M1 and right MCA bifurcation are patent, although with moderate stenosis at the posterior right MCA M2 origin (series 12, image 10). No right MCA branch occlusion identified. Venous sinuses: Early contrast timing, grossly patent. Anatomic variants: Fetal type PCA origins. Review of the MIP images confirms the above findings IMPRESSION: 1. Positive  for emergent large vessel occlusion: Left MCA origin. Some left MCA branch reconstitution. 2. CT Perfusion underestimates infarct core (ASPECTS 6), which is estimated at 11-22 mL, subsequent estimated penumbra of 60 to 67 mL. 3. No other large vessel occlusion. But intracranial atherosclerosis with: - Severe stenosis Right ACA origin. - moderate stenosis Right MCA M2 branch origin. - mild stenosis Right MCA M1, also Vertebrobasilar junction. 4. Cervical carotid atherosclerosis without stenosis. #1 discussed by telephone with Dr. Lesleigh Noe on 06/30/2020 at 0732 hours. And [facility] CTP findings discussed at 0749 hours. Electronically Signed   By: Genevie Ann M.D.   On: 06/30/2020 07:58   DG CHEST PORT 1 VIEW  Result Date: 07/02/2020 CLINICAL DATA:  Fever, shortness of breath, hypertension. Status post LEFT MCA thrombectomy. EXAM: PORTABLE CHEST 1 VIEW COMPARISON:  Chest x-ray dated 06/30/2020. FINDINGS: Endotracheal tube has been removed. Enteric tube passes below the diaphragm. Heart size and mediastinal contours appear stable. Lungs are clear. No pleural effusion or pneumothorax is seen. IMPRESSION: No active disease. No evidence of pneumonia or pulmonary  edema. Electronically Signed   By: Franki Cabot M.D.   On: 07/02/2020 11:01   Portable Chest x-ray  Result Date: 06/30/2020 CLINICAL DATA:  Evaluate endotracheal tube placement EXAM: PORTABLE CHEST 1 VIEW COMPARISON:  09/17/2019 FINDINGS: The tip of the ET tube appears oriented towards the left mainstem bronchus. Consider withdrawing by 2 cm. The enteric tube tip is in the proximal stomach. The side port for the enteric tube is just above the GE junction. Heart size normal. No pleural effusion, interstitial edema or airspace consolidation. IMPRESSION: 1. ET tube tip appears oriented towards the left mainstem bronchus. Consider withdrawing by 2 cm. 2. Consider advancement of enteric tube. 3. Lungs appear clear. 4. These results will be called to the ordering clinician or representative by the Radiologist Assistant, and communication documented in the PACS or [employer]. Electronically Signed   By: Kerby Moors M.D.   On: 06/30/2020 13:59   DG Abd Portable 1V  Result Date: 07/04/2020 CLINICAL DATA:  Enteric tube placement, history of gastric bypass EXAM: PORTABLE ABDOMEN - 1 VIEW COMPARISON:  07/01/2020 abdominal radiograph FINDINGS: Weighted enteric tube terminates in mid to lower left abdomen within a proximal small bowel loop. Cholecystectomy clips are seen in the right upper quadrant of the abdomen. Surgical sutures are noted in the medial upper abdomen bilaterally. No dilated small bowel loops. No evidence of pneumatosis or pneumoperitoneum. No radiopaque nephrolithiasis. IMPRESSION: Weighted enteric tube terminates in the mid to lower left abdomen within a proximal small bowel loop in this patient with a history of gastric bypass surgery. Electronically Signed   By: Ilona Sorrel M.D.   On: 07/04/2020 12:01   DG Abd Portable 1V  Result Date: 07/01/2020 CLINICAL DATA:  Feeding tube placement EXAM: PORTABLE ABDOMEN - 1 VIEW COMPARISON:  CT abdomen 07/14/2019 FINDINGS: A feeding tube is noted  extending inferiorly into the left to terminate just above the left iliac crest, presumably in a somewhat dilated stomach given the low location. Otherwise unremarkable bowel gas pattern. Clips in the right upper quadrant likely from prior cholecystectomy. IMPRESSION: 1. The feeding tube extends inferiorly into the left abdomen, presumably in a somewhat dilated stomach given the low location. Otherwise unremarkable bowel gas pattern. 2. Status post cholecystectomy. Electronically Signed   By: Van Clines M.D.   On: 07/01/2020 16:08   DG Swallowing Func-Speech Pathology  Result Date: 07/19/2020 Objective Swallowing Evaluation: Type of Study: MBS-Modified  Barium Swallow Study  Patient Details Name: Courtney Burke MRN: [id number] Date of Birth: Feb 09, 1956 Today's Date: 07/19/2020 Past Medical History: Past Medical History: Diagnosis Date . Asthma  . Borderline diabetes  . Hypertension  Past Surgical History: Past Surgical History: Procedure Laterality Date . ANKLE SURGERY   . CARPAL TUNNEL RELEASE   . carpel tunnel   . IR CT HEAD LTD  06/30/2020 . IR INTRA CRAN STENT  06/30/2020 . IR PERCUTANEOUS ART THROMBECTOMY/INFUSION INTRACRANIAL INC DIAG ANGIO  06/30/2020 . RADIOLOGY WITH ANESTHESIA N/A 06/30/2020  Procedure: RADIOLOGY WITH ANESTHESIA;  Surgeon: Radiologist, Medication, MD;  Location: [place];  Service: Radiology;  Laterality: N/A; HPI: Ameisha Mcclellan is a 65 y.o. 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 CT probable few small areas of acute left MCA infarction, less apparent. Ill-defined patchy hyperdensity may reflect contrast. Found to have M1 occlusion and underwent thrombectomy and intubated 1/27-1/28 am. Pt admitted to [facility] 07/09/20.  Assessment / Plan / Recommendation CHL IP CLINICAL IMPRESSIONS 07/19/2020 Clinical Impression Pt presents with mild oropharyngeal dysphagia, but  excellent functional improvements since last MBSS performed 07/04/20. Although she took very large consecutive sips despite cues to slow rate and use single sips, no instances of aspiration of thin barium was observed throughout multiple trials. Only intermittent flash penetration of thin barium observed (and mostly at the start of the study). However, 100% of penetrates remained far above the level of the vocal folds and were fully ejected from the vestibule during the swallow. Consumption via cup or straw did not impact her ability to protect her airway. Her swallow initiation was timely in 95% of instances, and even when swallow initiated at pyriform sinuses X1, laryngeal vestibule closure was effective to prevent intrusion. Pt exhibited functional mastication of dysphagia 3 (mechanical soft) solids, although piecemeal swallowing noted. She independently used extra swallows to clear min oral residue. When attempting to swallow barium pill with thin, pt unable to follow oral motor command to swallow as 1 cohesive bolus - instead she chewed the pill. Given results of today's study, recommend pt upgrade to dysphagia 3 (mechanical soft) textures, thin liquids, medications should be crushed in applesauce or pudding. Pt should still have full supervision during PO intake. Encourage pt to take small, slow sips of drinks and check for oral residue/pocketing, cue pt to use liquid washes to assist with full oral clearance. ST will continue to provide skilled interventions to ensure diet safety and efficiency and facilitate greater carryover of compensatory swallow strategies. SLP Visit Diagnosis Dysphagia, oropharyngeal phase (R13.12) Attention and concentration deficit following -- Frontal lobe and executive function deficit following -- Impact on safety and function Mild aspiration risk   CHL IP TREATMENT RECOMMENDATION 07/04/2020 Treatment Recommendations Therapy as outlined in treatment plan below   Prognosis 07/04/2020  Prognosis for Safe Diet Advancement Good Barriers to Reach Goals -- Barriers/Prognosis Comment -- CHL IP DIET RECOMMENDATION 07/19/2020 SLP Diet Recommendations Dysphagia 3 (Mech soft) solids;Thin liquid Liquid Administration via Cup;Straw Medication Administration Crushed with puree Compensations Slow rate;Small sips/bites;Minimize environmental distractions;Follow solids with liquid Postural Changes Remain semi-upright after after feeds/meals (Comment);Seated upright at 90 degrees   CHL IP OTHER RECOMMENDATIONS 07/19/2020 Recommended Consults -- Oral Care Recommendations Oral care BID Other Recommendations --   CHL IP FOLLOW UP RECOMMENDATIONS 07/07/2020 Follow up Recommendations Inpatient Rehab   CHL IP FREQUENCY AND DURATION  07/04/2020 Speech Therapy Frequency (ACUTE ONLY) min 2x/week Treatment Duration 2 weeks      CHL IP ORAL PHASE 07/19/2020 Oral Phase -- Oral - Pudding Teaspoon -- Oral - Pudding Cup -- Oral - Honey Teaspoon -- Oral - Honey Cup -- Oral - Nectar Teaspoon -- Oral - Nectar Cup NT Oral - Nectar Straw NT Oral - Thin Teaspoon -- Oral - Thin Cup Delayed oral transit Oral - Thin Straw Delayed oral transit Oral - Puree NT Oral - Mech Soft Piecemeal swallowing Oral - Regular -- Oral - Multi-Consistency -- Oral - Pill Decreased bolus cohesion;Delayed oral transit;Lingual/palatal residue;Other (Comment) Oral Phase - Comment --  CHL IP PHARYNGEAL PHASE 07/19/2020 Pharyngeal Phase Impaired Pharyngeal- Pudding Teaspoon -- Pharyngeal -- Pharyngeal- Pudding Cup -- Pharyngeal -- Pharyngeal- Honey Teaspoon -- Pharyngeal -- Pharyngeal- Honey Cup -- Pharyngeal -- Pharyngeal- Nectar Teaspoon -- Pharyngeal -- Pharyngeal- Nectar Cup NT Pharyngeal -- Pharyngeal- Nectar Straw NT Pharyngeal -- Pharyngeal- Thin Teaspoon -- Pharyngeal -- Pharyngeal- Thin Cup Penetration/Aspiration during swallow Pharyngeal Material enters airway, remains ABOVE vocal cords then ejected out Pharyngeal- Thin Straw WFL Pharyngeal Material does not  enter airway Pharyngeal- Puree NT Pharyngeal -- Pharyngeal- Mechanical Soft WFL Pharyngeal -- Pharyngeal- Regular -- Pharyngeal -- Pharyngeal- Multi-consistency -- Pharyngeal -- Pharyngeal- Pill WFL Pharyngeal -- Pharyngeal Comment --  CHL IP CERVICAL ESOPHAGEAL PHASE 07/19/2020 Cervical Esophageal Phase WFL Pudding Teaspoon -- Pudding Cup -- Honey Teaspoon -- Honey Cup -- Nectar Teaspoon -- Nectar Cup -- Nectar Straw -- Thin Teaspoon -- Thin Cup -- Thin Straw -- Puree -- Mechanical Soft -- Regular -- Multi-consistency -- Pill -- Cervical Esophageal Comment -- Arbutus Leas 07/19/2020, 9:47 AM              DG Swallowing Func-Speech Pathology  Result Date: 07/04/2020 Objective Swallowing Evaluation: Type of Study: MBS-Modified Barium Swallow Study  Patient Details Name: Courtney Burke MRN: [id number] Date of Birth: [birth year] Today's Date: 07/04/2020 Time: SLP Start Time (ACUTE ONLY): 1419 -SLP Stop Time (ACUTE ONLY): 1434 SLP Time Calculation (min) (ACUTE ONLY): 15 min Past Medical History: Past Medical History: Diagnosis Date . Asthma  . Borderline diabetes  . Hypertension  Past Surgical History: Past Surgical History: Procedure Laterality Date . ANKLE SURGERY   . CARPAL TUNNEL RELEASE   . carpel tunnel   . IR CT HEAD LTD  06/30/2020 . IR INTRA CRAN STENT  06/30/2020 . IR PERCUTANEOUS ART THROMBECTOMY/INFUSION INTRACRANIAL INC DIAG ANGIO  06/30/2020 . RADIOLOGY WITH ANESTHESIA N/A 06/30/2020  Procedure: RADIOLOGY WITH ANESTHESIA;  Surgeon: Radiologist, Medication, MD;  Location: [place];  Service: Radiology;  Laterality: N/A; HPI: Chanell Nadeau is a 65 y.o. 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 CT probable few small areas of acute left MCA infarction, less apparent. Ill-defined patchy hyperdensity may reflect contrast. Found to have M1 occlusion and underwent thrombectomy and intubated  1/27-1/28 am. MRI pending.  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 She cleared oral cavity with mechanical soft textures. Consistently lingual residue fell to pyriform sinuses after swallow with spontaneous subswallow to clear. Her intact strength and ROM allowed her to achieve complete laryngeal protection. However, mistimed initiation of protection in  addition to larger sip with straw, led to trace aspiration stopping just below her vocal cords. A weak reflexive throat clear audible after 10 second delay. Pt's comprehension and execution of postures may not be consistent and able to rely on for safe consumption. Recommend initiate Dys 2 (fine chopped), thin liquids, NO straws, full supervision/assist, check for oral pocketing and pills whole in puree. Esophageal scan was unremarkable. SLP Visit Diagnosis Dysphagia, oropharyngeal phase (R13.12) Attention and concentration deficit following -- Frontal lobe and executive function deficit following -- Impact on safety and function Mild aspiration risk;Moderate aspiration risk   CHL IP TREATMENT RECOMMENDATION 07/04/2020 Treatment Recommendations Therapy as outlined in treatment plan below   Prognosis 07/04/2020 Prognosis for Safe Diet Advancement Good Barriers to Reach Goals -- Barriers/Prognosis Comment -- CHL IP DIET RECOMMENDATION 07/04/2020 SLP Diet Recommendations Dysphagia 2 (Fine chop) solids;Thin liquid Liquid Administration via No straw;Cup Medication Administration Whole meds with puree Compensations Slow rate;Small sips/bites;Lingual sweep for clearance of pocketing;Other (Comment) Postural Changes Seated upright at 90 degrees   CHL IP OTHER RECOMMENDATIONS 07/04/2020 Recommended Consults -- Oral Care Recommendations Oral care BID  Other Recommendations --   CHL IP FOLLOW UP RECOMMENDATIONS 07/04/2020 Follow up Recommendations Inpatient Rehab   CHL IP FREQUENCY AND DURATION 07/04/2020 Speech Therapy Frequency (ACUTE ONLY) min 2x/week Treatment Duration 2 weeks      CHL IP ORAL PHASE 07/04/2020 Oral Phase Impaired Oral - Pudding Teaspoon -- Oral - Pudding Cup -- Oral - Honey Teaspoon -- Oral - Honey Cup -- Oral - Nectar Teaspoon -- Oral - Nectar Cup Delayed oral transit;Piecemeal swallowing Oral - Nectar Straw Delayed oral transit;Piecemeal swallowing Oral - Thin Teaspoon -- Oral - Thin Cup Right anterior bolus loss Oral - Thin Straw Delayed oral transit Oral - Puree Delayed oral transit;Other (Comment) Oral - Mech Soft Delayed oral transit Oral - Regular -- Oral - Multi-Consistency -- Oral - Pill -- Oral Phase - Comment --  CHL IP PHARYNGEAL PHASE 07/04/2020 Pharyngeal Phase Impaired Pharyngeal- Pudding Teaspoon -- Pharyngeal -- Pharyngeal- Pudding Cup -- Pharyngeal -- Pharyngeal- Honey Teaspoon -- Pharyngeal -- Pharyngeal- Honey Cup -- Pharyngeal -- Pharyngeal- Nectar Teaspoon -- Pharyngeal -- Pharyngeal- Nectar Cup WFL Pharyngeal -- Pharyngeal- Nectar Straw WFL Pharyngeal Other (Comment) Pharyngeal- Thin Teaspoon -- Pharyngeal -- Pharyngeal- Thin Cup WFL Pharyngeal -- Pharyngeal- Thin Straw Penetration/Aspiration during swallow Pharyngeal Other (Comment) Pharyngeal- Puree WFL Pharyngeal -- Pharyngeal- Mechanical Soft WFL Pharyngeal -- Pharyngeal- Regular -- Pharyngeal -- Pharyngeal- Multi-consistency -- Pharyngeal -- Pharyngeal- Pill -- Pharyngeal -- Pharyngeal Comment --  CHL IP CERVICAL ESOPHAGEAL PHASE 07/04/2020 Cervical Esophageal Phase WFL Pudding Teaspoon -- Pudding Cup -- Honey Teaspoon -- Honey Cup -- Nectar Teaspoon -- Nectar Cup -- Nectar Straw -- Thin Teaspoon -- Thin Cup -- Thin Straw -- Puree -- Mechanical Soft -- Regular -- Multi-consistency -- Pill -- Cervical Esophageal Comment -- Courtney Burke 07/04/2020, 3:24 PM Courtney Burke M.Ed Actor Pager [phone] Office [phone]              EEG adult  Result Date: 07/01/2020 Lora Havens, MD     07/01/2020 10:52 AM Patient Name: Rola Lennon MRN: [id number] Epilepsy Attending: Lora Havens Referring Physician/Provider: Dr. Rosalin Hawking Date: 07/01/2020 Duration: 24.04 mins Patient history: 65 year old female with recent left MCA stroke status post thrombectomy. EEG to evaluate for seizures. Level of alertness: Awake AEDs during EEG study: None Technical aspects: This EEG study was done with scalp electrodes positioned according to the  10-20 International system of electrode placement. Electrical activity was acquired at a sampling rate of 500Hz  and reviewed with a high frequency filter of 70Hz  and a low frequency filter of 1Hz . EEG data were recorded continuously and digitally stored. Description: The posterior dominant rhythm consists of 8 Hz activity of moderate voltage (25-35 uV) seen predominantly in posterior head regions, symmetric and reactive to eye opening and eye closing.  EEG showed intermittent generalized and maximal left frontal region 3 to 5 Hz theta-delta slowing.  The 3 to 5 Hz theta and delta slowing in the left frontal region appeared sharply contoured and waxes and wanes without definite evolution.  Hyperventilation and photic stimulation were not performed.   ABNORMALITY -Frontal intermittent rhythmic delta activity, left frontal region -Intermittent slow, generalized and maximal left frontal region IMPRESSION: This study is suggestive of cortical dysfunction in left frontal region likely secondary underlying stroke as well as mild diffuse encephalopathy, nonspecific etiology.  Intermittent rhythmic delta activity in left frontal region is on the ictal-interictal continuum with low to intermediate potential for seizures.  No seizures or epileptiform discharges were seen throughout the recording. Lora Havens    ECHOCARDIOGRAM COMPLETE  Result Date: 07/01/2020    ECHOCARDIOGRAM REPORT   Patient Name:   [facility] Date of Exam: 07/01/2020 Medical Rec #:  [id number]     Height:       62.0 in Accession #:    [id number]    Weight:       207.0 lb Date of Birth:  08/02/1955     BSA:          1.940 m Patient Age:    27 years      BP:           119/64 mmHg Patient Gender: F             HR:           81 bpm. Exam Location:  Inpatient Procedure: 2D Echo Indications:    Stroke I163.9  History:        Patient has no prior history of Echocardiogram examinations.                 Risk Factors:Hypertension.  Sonographer:    Mikki Santee RDCS (AE) Referring Phys: [place]  1. Left ventricular ejection fraction, by estimation, is 60 to 65%. The left ventricle has normal function. The left ventricle has no regional wall motion abnormalities. There is moderate left ventricular hypertrophy. Left ventricular diastolic parameters are consistent with Grade I diastolic dysfunction (impaired relaxation).  2. Right ventricular systolic function is normal. The right ventricular size is normal. There is normal pulmonary artery systolic pressure. The estimated right ventricular systolic pressure is 85.8 mmHg.  3. The mitral valve is grossly normal. Trivial mitral valve regurgitation.  4. The aortic valve is tricuspid. Aortic valve regurgitation is not visualized.  5. The inferior vena cava is normal in size with greater than 50% respiratory variability, suggesting right atrial pressure of 3 mmHg. Comparison(s): No prior Echocardiogram. FINDINGS  Left Ventricle: Left ventricular ejection fraction, by estimation, is 60 to 65%. The left ventricle has normal function. The left ventricle has no regional wall motion abnormalities. The left ventricular internal cavity size was normal in size. There is  moderate left ventricular hypertrophy. Left ventricular diastolic parameters are consistent with Grade I diastolic  dysfunction (impaired relaxation). Indeterminate filling pressures. Right Ventricle: The right ventricular size is normal. No increase in right  ventricular wall thickness. Right ventricular systolic function is normal. There is normal pulmonary artery systolic pressure. The tricuspid regurgitant velocity is 2.71 m/s, and  with an assumed right atrial pressure of 3 mmHg, the estimated right ventricular systolic pressure is 89.3 mmHg. Left Atrium: Left atrial size was normal in size. Right Atrium: Right atrial size was normal in size. Pericardium: There is no evidence of pericardial effusion. Mitral Valve: The mitral valve is grossly normal. Trivial mitral valve regurgitation. Tricuspid Valve: The tricuspid valve is grossly normal. Tricuspid valve regurgitation is trivial. Aortic Valve: The aortic valve is tricuspid. Aortic valve regurgitation is not visualized. Pulmonic Valve: The pulmonic valve was normal in structure. Pulmonic valve regurgitation is not visualized. Aorta: The aortic root and ascending aorta are structurally normal, with no evidence of dilitation. Venous: The inferior vena cava is normal in size with greater than 50% respiratory variability, suggesting right atrial pressure of 3 mmHg. IAS/Shunts: No atrial level shunt detected by color flow Doppler.  LEFT VENTRICLE PLAX 2D LVIDd:         3.60 cm  Diastology LVIDs:         2.40 cm  LV e' medial:    6.53 cm/s LV PW:         1.30 cm  LV E/e' medial:  9.4 LV IVS:        1.30 cm  LV e' lateral:   6.85 cm/s LVOT diam:     2.10 cm  LV E/e' lateral: 8.9 LV SV:         86 LV SV Index:   44 LVOT Area:     3.46 cm  RIGHT VENTRICLE RV S prime:     14.40 cm/s TAPSE (M-mode): 2.3 cm LEFT ATRIUM             Index       RIGHT ATRIUM           Index LA diam:        3.40 cm 1.75 cm/m  RA Area:     14.80 cm LA Vol (A2C):   36.6 ml 18.87 ml/m RA Volume:   35.40 ml  18.25 ml/m LA Vol (A4C):   48.9 ml 25.21 ml/m LA Biplane Vol: 44.7 ml 23.04 ml/m  AORTIC VALVE  LVOT Vmax:   107.00 cm/s LVOT Vmean:  69.700 cm/s LVOT VTI:    0.248 m  AORTA Ao Root diam: 2.80 cm Ao Asc diam:  3.20 cm MITRAL VALVE               TRICUSPID VALVE MV Area (PHT): 2.87 cm    TR Peak grad:   29.4 mmHg MV Decel Time: 264 msec    TR Vmax:        271.00 cm/s MV E velocity: 61.10 cm/s MV A velocity: 99.90 cm/s  SHUNTS MV E/A ratio:  0.61        Systemic VTI:  0.25 m                            Systemic Diam: 2.10 cm Lyman Bishop MD Electronically signed by Lyman Bishop MD Signature Date/Time: 07/01/2020/1:26:54 PM    Final    IR PERCUTANEOUS ART THROMBECTOMY/INFUSION INTRACRANIAL INC DIAG ANGIO  Result Date: 07/04/2020 INDICATION: Aphasia, left gaze deviation with right-sided weakness. 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 EXAM: 1. EMERGENT LARGE VESSEL OCCLUSION THROMBOLYSIS anterior CIRCULATION) COMPARISON:  CT angiogram of the head and neck of June 30, 2020. MEDICATIONS: Ancef 2 g IV antibiotic was administered within 1 hour of the procedure. ANESTHESIA/SEDATION: General anesthesia CONTRAST:  Isovue 300 approximately 120 mL FLUOROSCOPY TIME:  Fluoroscopy Time: 15 minutes 18 seconds (2667 mGy). COMPLICATIONS: None immediate. TECHNIQUE: Following a full explanation of the procedure along with the potential associated complications, an informed witnessed consent was obtained from the patient's daughter. The risks of intracranial hemorrhage of 10%, worsening neurological deficit, ventilator dependency, death and inability to revascularize were all reviewed in detail with the patient's daughter. The patient was then put under general anesthesia by the Department of Anesthesiology at [facility]. The right groin was prepped and draped in the usual sterile fashion. Thereafter using modified Seldinger technique, transfemoral access into the right common femoral artery was obtained without difficulty. Over a 0.035 inch guidewire an 8 [place] from  25 cm Pinnacle sheath was inserted. Through this, and also over a 0.035 inch guidewire a combination of Simmons 2 5.5 [place] support catheter inside of 087 balloon guide catheter combination was advanced to the aortic arch, and cannulation was performed of the left common carotid artery. Over a 0.035 inch glide guidewire, the support catheter, and the 087 balloon guidewire were advanced to the left common carotid artery bifurcation. The guidewire and support Simmons 2 were removed. Good aspiration obtained from the hub of the balloon guide catheter. Control arteriogram performed through the balloon guide in the left common carotid artery was performed. FINDINGS: The left common carotid arteriogram demonstrates the left external carotid artery and its major branches to be widely patent. The left internal carotid artery at the bulb to the cranial skull base demonstrates wide patency with moderate tortuosity in its mid cervical segment. The petrous, cavernous and supraclinoid segments demonstrate wide patency. 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 The left anterior cerebral artery demonstrates approximately 50% stenosis at its A1 segment. However, flow is noted distally into the left anterior cerebral artery distribution. Flash filling of the left posterior communicating artery is also demonstrated. PROCEDURE: Through the balloon guide catheter in the proximal left internal carotid artery, a combination of an 014 inch standard Synchro micro guidewire with an 021 Headway microcatheter and an 071 136 cm Zoom aspiration catheter was advanced as the combination to the supraclinoid left ICA. The balloon guide was advanced further distally into the distal cervical left ICA. Using a torque device, access was obtained into the occluded left middle cerebral artery into the inferior division branch M2 M3 region followed by the microcatheter. The guidewire was removed. Good  aspiration obtained from the tip of the microcatheter. A gentle control arteriogram performed through microcatheter demonstrated safe position of the tip of the microcatheter which was now connected to continuous heparinized saline infusion. An Hugo retrieval device was then advanced to the distal end of the microcatheter. The O ring on the delivery microcatheter was loosened. The Tiger retrieval device was retrieved and deployed such that the proximal marker was just inside the proximal portion of the occluded left middle cerebral artery. The 071 aspiration catheter was advanced to just proximal to the origin of the right middle cerebral artery. Thereafter the retrieval device was expanded and decreased in size multiple times. 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 The retrieval device was again expanded  right at the proximal aspect of the occlusion. Microcatheter was locked into position. Thereafter as constant aspiration was applied at the hub of the aspiration catheter at the origin of the left middle cerebral artery, and with the a 20 mL syringe at the hub of the balloon guide catheter in the left internal carotid artery with proximal occlusion for about 2 minutes, the combination of the retrieval device, the microcatheter and the Zoom aspiration device were retrieved and removed. Following reversal of flow arrest, control arteriogram performed through the balloon guide in the left internal carotid artery now demonstrated complete revascularization of the left middle cerebral artery distribution with a TICI 3 revascularization. However, this also unmasked a severe high-grade stenosis at the left MCA origin. At this point through the balloon guide in the left internal carotid artery, a combination of a 5 [place] Catalyst guide  catheter inside of which was the 021 microcatheter was advanced over a 0.014 inch standard Synchro micro guidewire to the supraclinoid left ICA. At this time there was complete occlusion of the left middle cerebral artery due to recoil phenomenon due to the atherosclerotic plaque. Micro guidewire was then gently advanced through the occluded left middle cerebral artery without difficulty in the superior division followed by the microcatheter. The guidewire was removed. Good aspiration obtained from the hub of the microcatheter. A gentle control arteriogram performed through this demonstrated safe position of the tip of the microcatheter. This in turn was then replaced with an 014 inch supported 300 cm Synchro micro guidewire with a J-tip configuration under constant fluoroscopic guidance. The tip of the exchange micro guidewire was maintained as the microcatheter was removed. Measurements were then performed of the left middle cerebral artery in its most normal segment just distal to the occlusion. It was elected to proceed with placement of a 2.25 mm x 12 mm Synergy drug-eluting stent. This was a retrogradely prepped with heparinized saline infusion, and also with 50% contrast and 50% heparinized saline infusion. Using the rapid exchange technique, the stent delivery system was advanced without difficulty to the supraclinoid left ICA. The delivery of the stent was then advanced without difficulty through the occluded left middle cerebral artery. Its proximal marker was positioned just proximal to the occluded left middle cerebral artery proximally. Thereafter, a control inflation was then performed using micro inflation syringe device via micro tubing with the balloon inflated to approximately 2.1 mm where it was maintained for approximately 20 seconds. Thereafter, with the wire distal, the balloon was retrieved and removed. A control arteriogram performed through the 5 [place] guide catheter in the left internal  carotid artery demonstrated complete angiographic revascularization of the left middle cerebral artery distribution achieving a TICI 3 revascularization. Also noted now was patency of the left posterior cerebral artery distribution via the posterior communicating artery. Control arteriograms were then performed at 15 and 30 minutes post deployment of the stent which continued to demonstrate excellent flow through the stented segment also with patency of the superior and inferior division branches into the more distal M4 and M5 regions of the left middle cerebral artery distribution. Final control arteriogram performed through the balloon guide in the left internal carotid artery after removal of the 5 [place] Catalyst guide catheter, and also the exchange micro guidewire demonstrated continued excellent apposition and patency of the angioplastied segment of the left middle cerebral artery with no intra stent abnormalities. A distal perfusion was now noted into the M4 M5 regions. Moderate spasm at the middle cervical left  ICA responded to 25 mcg of nitroglycerin intra-arterially. The left anterior cerebral artery remained widely patent with cross filling of the right anterior cerebral A2 segment as described earlier. Balloon guide was removed. The Pinnacle sheath was removed with successful hemostasis with an 8 French Angio-Seal closure device. Distal pulses remained Dopplerable in both feet unchanged. Patient was left intubated to protect her airway as per anesthesia. An immediate CT scan of the brain performed on the table demonstrated contrast stain in the left basal ganglia region, and also the left parietal subcortical area. Patient was loaded with aspirin 81 mg, and Brilinta 180 mg p.o. via an orogastric tube just prior to the balloon angioplasty. A loading dose of cangrelor IV was given right after placement of the stent, with a 4 hr low-dose infusion with a CT of the brain to follow. The patient's pupils were  approximately 2 mm bilaterally though sluggish. The patient was then transferred to the PACU and then neuro ICU for post revascularization treatment. IMPRESSION: Status post endovascular complete revascularization of occluded left middle cerebral artery M1 segment with 1 pass with the Tiger 17 retrieval device and proximal aspiration followed by placement of a 2.25 mm x 12 mm Synergy balloon mounted drug-eluting stent with achievement of a TICI 3 revascularization. PLAN: Follow-up in the clinic approximately 4 weeks post discharge. Electronically Signed   By: Luanne Bras M.D.   On: 07/01/2020 12:48   CT HEAD CODE STROKE WO CONTRAST  Result Date: 06/30/2020 CLINICAL DATA:  Code stroke. 65 year old female with right side weakness, nonverbal. EXAM: CT HEAD WITHOUT CONTRAST TECHNIQUE: Contiguous axial images were obtained from the base of the skull through the vertex without intravenous contrast. COMPARISON:  [facility] brain MRI 02/15/2016 FINDINGS: Brain: No ventriculomegaly. No midline shift, mass effect, or evidence of intracranial mass lesion. No acute intracranial hemorrhage identified. Asymmetric left MCA territory indistinct gray and white matter hypodensity compatible with cytotoxic edema (series 2, image 15, 13). Left insula, left M2 and M3 segments affected. Left lentiform also asymmetric on series 2, image 16. Contralateral right hemisphere and posterior fossa gray-white matter differentiation is preserved. There is a small chronic appearing right cerebellar infarct which is new from 2017 (series 2, image 10). No acute intracranial hemorrhage identified. Vascular: Mild Calcified atherosclerosis at the skull base. No suspicious intracranial vascular hyperdensity. Skull: Negative. Sinuses/Orbits: 6 Visualized paranasal sinuses and mastoids are clear. Other: Leftward gaze deviation. Visualized scalp soft tissues are within normal limits. ASPECTS [facility]  Stroke Program Early CT Score) - Ganglionic level infarction (caudate, lentiform nuclei, internal capsule, insula, M1-M3 cortex): 3 - Supraganglionic infarction (M4-M6 cortex): 3 Total score (0-10 with 10 being normal): 6 IMPRESSION: 1. Acute Left MCA territory infarct. ASPECTS 6. No hemorrhage or mass effect at this time. 2. The above discussed by telephone with Dr. Curly Shores on 06/30/2020 at 07:32 . 3. 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 Electronically Signed   By: Genevie Ann M.D.   On: 06/30/2020 07:34   VAS [place] LOWER EXTREMITY VENOUS (DVT)  Result Date: 07/01/2020  Lower Venous DVT Study Other Indications: Embolic stroke. Risk Factors: None identified. Comparison Study: No previous exam Performing Technologist: Vonzell Schlatter RVT  Examination Guidelines: A complete evaluation includes B-mode imaging, spectral Doppler, color Doppler, and power Doppler as needed of all accessible portions of each vessel. Bilateral testing is considered an integral part of a complete examination. Limited examinations for reoccurring indications may be performed  as noted. The reflux portion of the exam is performed with the patient in reverse Trendelenburg.  +---------+---------------+---------+-----------+----------+--------------+ RIGHT    CompressibilityPhasicitySpontaneityPropertiesThrombus Aging +---------+---------------+---------+-----------+----------+--------------+ CFV      Full           Yes      Yes                                 +---------+---------------+---------+-----------+----------+--------------+ SFJ      Full                                                        +---------+---------------+---------+-----------+----------+--------------+ FV Prox  Full                                                        +---------+---------------+---------+-----------+----------+--------------+ FV Mid   Full                                                         +---------+---------------+---------+-----------+----------+--------------+ FV DistalFull                                                        +---------+---------------+---------+-----------+----------+--------------+ PFV      Full                                                        +---------+---------------+---------+-----------+----------+--------------+ POP      Full           Yes      Yes                                 +---------+---------------+---------+-----------+----------+--------------+ PTV      Full                                                        +---------+---------------+---------+-----------+----------+--------------+ PERO     Full                                                        +---------+---------------+---------+-----------+----------+--------------+   +---------+---------------+---------+-----------+----------+--------------+ LEFT     CompressibilityPhasicitySpontaneityPropertiesThrombus Aging +---------+---------------+---------+-----------+----------+--------------+ CFV      Full           Yes  Yes                                 +---------+---------------+---------+-----------+----------+--------------+ SFJ      Full                                                        +---------+---------------+---------+-----------+----------+--------------+ FV Prox  Full                                                        +---------+---------------+---------+-----------+----------+--------------+ FV Mid   Full                                                        +---------+---------------+---------+-----------+----------+--------------+ FV DistalFull                                                        +---------+---------------+---------+-----------+----------+--------------+ PFV      Full                                                         +---------+---------------+---------+-----------+----------+--------------+ POP      Full           Yes      Yes                                 +---------+---------------+---------+-----------+----------+--------------+ PTV      Full                                                        +---------+---------------+---------+-----------+----------+--------------+ PERO     Full                                                        +---------+---------------+---------+-----------+----------+--------------+     Summary: BILATERAL: - No evidence of deep vein thrombosis seen in the lower extremities, bilaterally. -No evidence of popliteal cyst, bilaterally. RIGHT: - Ultrasound characteristics of a ruptured Baker's Cyst are noted.   *See table(s) above for measurements and observations. Electronically signed by Jamelle Haring on 07/01/2020 at 5:16:40 PM.    Final     Labs:  Basic Metabolic Panel: No results for input(s): NA, K, CL, CO2, GLUCOSE, BUN,  CREATININE, CALCIUM, MG, PHOS in the last 168 hours.  CBC: No results for input(s): WBC, NEUTROABS, HGB, HCT, MCV, PLT in the last 168 hours.  CBG: Recent Labs  Lab 07/24/20 2109 07/25/20 0626 07/25/20 1129 07/25/20 1606 07/25/20 2109  GLUCAP 107* 104* 91 106* 102*   Family history.  Hypertension and hyperlipidemia.  Denies any colon cancer esophageal cancer or rectal cancer  Brief HPI:   Murdis Flitton is a 65 y.o. right-handed female with history of hypertension, diet-controlled diabetes mellitus, CKD stage III, morbid obesity gastric sleeve 05/24/2020.  Presented 06/30/2020 with altered mental status and aphasia.  Cranial CT scan showed acute left MCA territory infarction.  No hemorrhage or mass-effect.  Patient did not receive TPA.  EEG negative for seizures.  CT angiogram of head and neck positive for emergent large vessel occlusion.  Left MCA origin.  Some left MCA branch reconstitution underwent MCA stenting per interventional  radiology.  Follow-up MRI showed acute infarct left MCA territory involving the basal ganglia as well as left temporal frontal parietal lobes.  Small amount of hemorrhage in the left posterior temporal lobe.  Additional small areas of acute infarction in the occipital white matter bilaterally and right cerebellum.  Echocardiogram with ejection fraction of 60 to 65%.  No wall motion abnormalities grade 1 diastolic dysfunction.  Maintained on aspirin 81 mg daily as well as Brilinta for CVA prophylaxis.  Patient follow-up outpatient cardiology service to discuss loop recorder placement.  Subcutaneous Lovenox for DVT prophylaxis.  Maintained on Keppra for seizure prophylaxis.  Hospital course complicated by post stroke dysphagia currently on a dysphagia #2 nectar thick liquid diet with alternative means of nutritional support.  Bouts of urinary retention placed on Urecholine.  Due to patient decreased functional ability and global aphasia was admitted for a comprehensive rehab program.   Hospital Course: Vergia Chea was admitted to rehab 07/09/2020 for inpatient therapies to consist of PT, ST and OT at least three hours five days a week. Past admission physiatrist, therapy team and rehab RN have worked together to provide customized collaborative inpatient rehab.  Pertaining to patient's left MCA infarct due to left MCA occlusion status post stenting.  Patient remained on aspirin as well as Brilinta and follow-up neurology services.  Subcutaneous Lovenox for DVT prophylaxis.  Patient follow-up outpatient cardiology services to discuss loop recorder placement.  Post stroke dysphagia currently on dysphagia #3 thin liquid diet.  Seizure prophylaxis initially on Keppra EEG negative Keppra since been discontinued no seizure activity.  Urinary retention placed on Urecholine voiding much improved.  Diet-controlled diabetes mellitus hemoglobin A1c 5.4 and blood sugar checks discontinued.  Hyperlipidemia Lipitor ongoing.   Morbid obesity status post gastric sleeve 05/24/2020 dietary follow-up.  CKD stage III baseline creatinine 1.41 latest creatinine 1.33 follow-up outpatient.  Bouts of constipation resolved with laxative assistance.   Blood pressures were monitored on TID basis and controlled  Diabetes has been monitored with ac/hs CBG checks and SSI was use prn for tighter BS control.    Rehab course: During patient's stay in rehab weekly team conferences were held to monitor patient's progress, set goals and discuss barriers to discharge. At admission, patient required minimal assist stand pivot transfers minimal assist 35 feet rolling walker.  Cueing for sequencing upper body bathing cueing for safety lower body bathing minimal assist toilet transfers  Physical exam.  Blood pressure 140/63 pulse 89 tender 97 6 respirations 19 oxygen saturation 97% room air Constitutional.  No acute distress HEENT Head.  Normocephalic  and atraumatic Eyes.  Pupils round and reactive to light no discharge without nystagmus Neck.  Supple nontender no JVD without thyromegaly Cardiac regular rate rhythm not any extra sounds or murmur heard. Abdomen.  Soft nontender positive bowel sounds without rebound Respiratory effort normal no respiratory distress without wheeze Musculoskeletal.  Normal range of motion no tenderness or edema in extremities Neurologic.  Alert globally aphasic unable to follow commands spontaneously moving bilateral upper extremities difficult to assess due to aphasia    He/She  has had improvement in activity tolerance, balance, postural control as well as ability to compensate for deficits. He/She has had improvement in functional use RUE/LUE  and RLE/LLE as well as improvement in awareness.  Supine to sit with supervision.  In sitting edge of bed patient donned bilateral slippers over nonslip socks.  Stand pivot transfers rolling walker close supervision.  Ambulates level tile 150 feet including turns close  supervision.  Bed mobility with distant supervision.  STS in ambulation to bathroom to complete toilet transfers rolling walker close supervision.  Close for standing clothing management/pericare.  Brush teeth completed hair care standing at sink with close supervision.  Family attending family teaching discussed diet management as well as swallowing strategies.  SLP also provided skilled education related to patient's current status for functional communication including explanation of aphasia and apraxia.  Full family teaching completed plan discharged to home       Disposition: Discharge to home    Diet: Dysphagia #3 thin liquids  Special Instructions: No driving smoking or alcohol   Follow-up cardiology services Dr. Curt Bears to discuss loop recorder placement  Medications at discharge 1.  Tylenol as needed 2.  Aspirin 81 mg p.o. daily 3.  Lipitor 80 mg p.o. daily 4.  Calcium citrate 200 mg 3 times daily 5.  MiraLAX daily BID as needed 6.  Brilinta 90 mg p.o. twice daily  30-35 minutes were spent completing discharge summary and discharge planning  Discharge Instructions    Ambulatory referral to Neurology   Complete by: As directed    An appointment is requested in approximately 4 weeks left MCA infarction   Ambulatory referral to Physical Medicine Rehab   Complete by: As directed    Moderate complexity follow-up 1 to 2 weeks left MCA infarction       Follow-up Information    Kirsteins, Luanna Salk, MD Follow up.   Specialty: Physical Medicine and Rehabilitation Why: Office to call for appointment Contact information: [place] [address] [phone]        Luanne Bras, MD Follow up.   Specialties: Interventional Radiology, Radiology Why: Call for appointment Contact information: [place] [address] [phone]        [name], MD Follow up.   Specialty: Cardiology Why: Call for  appointment Contact information: [address] [place] [address] [phone]               Signed: Cathlyn Parsons 07/26/2020, 5:20 AM

## 2020-07-24 NOTE — Plan of Care (Signed)
  Problem: Consults Goal: RH STROKE PATIENT EDUCATION Description: See Patient Education module for education specifics  Outcome: Progressing   Problem: RH BOWEL ELIMINATION Goal: RH STG MANAGE BOWEL WITH ASSISTANCE Description: STG Manage Bowel with Mod I Assistance. Outcome: Progressing Goal: RH STG MANAGE BOWEL W/MEDICATION W/ASSISTANCE Description: STG Manage Bowel with Medication with Mod I Assistance. Outcome: Progressing   Problem: RH BLADDER ELIMINATION Goal: RH STG MANAGE BLADDER WITH ASSISTANCE Description: STG Manage Bladder With Mod I Assistance Outcome: Progressing Goal: RH STG MANAGE BLADDER WITH MEDICATION WITH ASSISTANCE Description: STG Manage Bladder With Medication With Mod I Assistance. Outcome: Progressing   Problem: RH SAFETY Goal: RH STG ADHERE TO SAFETY PRECAUTIONS W/ASSISTANCE/DEVICE Description: STG Adhere to Safety Precautions With Mod I Assistance/Device. Outcome: Progressing Goal: RH STG DECREASED RISK OF FALL WITH ASSISTANCE Description: STG Decreased Risk of Fall With Mod I Assistance. Outcome: Progressing   Problem: RH COGNITION-NURSING Goal: RH STG USES MEMORY AIDS/STRATEGIES W/ASSIST TO PROBLEM SOLVE Description: STG Uses Memory Aids/Strategies With Mod I Assistance to Problem Solve. Outcome: Progressing Goal: RH STG ANTICIPATES NEEDS/CALLS FOR ASSIST W/ASSIST/CUES Description: STG Anticipates Needs/Calls for Assist With Mod I Assistance/Cues. Outcome: Progressing   Problem: RH PAIN MANAGEMENT Goal: RH STG PAIN MANAGED AT OR BELOW PT'S PAIN GOAL Description: Assess and treat pain q shift and as needed Outcome: Progressing

## 2020-07-25 ENCOUNTER — Other Ambulatory Visit: Payer: Self-pay | Admitting: Physician Assistant

## 2020-07-25 LAB — GLUCOSE, CAPILLARY
Glucose-Capillary: 104 mg/dL — ABNORMAL HIGH (ref 70–99)
Glucose-Capillary: 91 mg/dL (ref 70–99)
Glucose-Capillary: 106 mg/dL — ABNORMAL HIGH (ref 70–99)
Glucose-Capillary: 102 mg/dL — ABNORMAL HIGH (ref 70–99)

## 2020-07-25 MED ORDER — TICAGRELOR 90 MG PO TABS: 90.0000 mg | ORAL_TABLET | Freq: Two times a day (BID) | ORAL | 1 refills | Status: DC

## 2020-07-25 MED ORDER — VITAMIN D3 25 MCG PO TABS: 1000.0000 [IU] | ORAL_TABLET | Freq: Every day | ORAL | 0 refills | Status: DC

## 2020-07-25 MED ORDER — CALCIUM CITRATE 950 (200 CA) MG PO TABS: 200.0000 mg | ORAL_TABLET | Freq: Three times a day (TID) | ORAL | 0 refills | Status: DC

## 2020-07-25 MED ORDER — ATORVASTATIN CALCIUM 80 MG PO TABS: 80.0000 mg | ORAL_TABLET | Freq: Every day | ORAL | 0 refills | Status: DC

## 2020-07-25 MED FILL — ATORVASTATIN CALCIUM 80 MG: 80 | 30 days supply | Qty: 30 | Fill #0

## 2020-07-25 MED FILL — VITAMIN D3 25 MCG TABS: 25 | 60 days supply | Qty: 60 | Fill #0

## 2020-07-25 MED FILL — BRILINTA 90 MG TABLET: 90 | 30 days supply | Qty: 60 | Fill #0

## 2020-07-25 NOTE — Plan of Care (Signed)
  Problem: Consults Goal: RH STROKE PATIENT EDUCATION Description: See Patient Education module for education specifics  Outcome: Progressing   Problem: RH BOWEL ELIMINATION Goal: RH STG MANAGE BOWEL WITH ASSISTANCE Description: STG Manage Bowel with Mod I Assistance. Outcome: Progressing Goal: RH STG MANAGE BOWEL W/MEDICATION W/ASSISTANCE Description: STG Manage Bowel with Medication with Mod I Assistance. Outcome: Progressing   Problem: RH BLADDER ELIMINATION Goal: RH STG MANAGE BLADDER WITH ASSISTANCE Description: STG Manage Bladder With Mod I Assistance Outcome: Progressing Goal: RH STG MANAGE BLADDER WITH MEDICATION WITH ASSISTANCE Description: STG Manage Bladder With Medication With Mod I Assistance. Outcome: Progressing   Problem: RH SAFETY Goal: RH STG ADHERE TO SAFETY PRECAUTIONS W/ASSISTANCE/DEVICE Description: STG Adhere to Safety Precautions With Mod I Assistance/Device. Outcome: Progressing Goal: RH STG DECREASED RISK OF FALL WITH ASSISTANCE Description: STG Decreased Risk of Fall With Mod I Assistance. Outcome: Progressing   Problem: RH COGNITION-NURSING Goal: RH STG USES MEMORY AIDS/STRATEGIES W/ASSIST TO PROBLEM SOLVE Description: STG Uses Memory Aids/Strategies With Mod I Assistance to Problem Solve. Outcome: Progressing Goal: RH STG ANTICIPATES NEEDS/CALLS FOR ASSIST W/ASSIST/CUES Description: STG Anticipates Needs/Calls for Assist With Mod I Assistance/Cues. Outcome: Progressing   Problem: RH PAIN MANAGEMENT Goal: RH STG PAIN MANAGED AT OR BELOW PT'S PAIN GOAL Description: Assess and treat pain q shift and as needed Outcome: Progressing

## 2020-07-25 NOTE — Progress Notes (Signed)
[place] PHYSICAL MEDICINE & REHABILITATION PROGRESS NOTE  Subjective/Complaints:  No issues overnight , eating well   ROS: limited due to language/communication    Objective: Vital Signs: Blood pressure 109/60, pulse 64, temperature 98 F (36.7 C), resp. rate 18, height 5\' 2"  (1.575 m), weight 90.8 kg, last menstrual period 09/28/2012, SpO2 99 %. No results found. No results for input(s): WBC, HGB, HCT, PLT in the last 72 hours. No results for input(s): NA, K, CL, CO2, GLUCOSE, BUN, CREATININE, CALCIUM in the last 72 hours.  Intake/Output Summary (Last 24 hours) at 07/25/2020 0924 Last data filed at 07/25/2020 0700 Gross per 24 hour  Intake 597 ml  Output 0 ml  Net 597 ml    Physical Exam: BP 109/60 (BP Location: Left Arm)   Pulse 64   Temp 98 F (36.7 C)   Resp 18   Ht 5\' 2"  (1.575 m)   Wt 90.8 kg   LMP 09/28/2012   SpO2 99%   BMI 36.61 kg/m    General: No acute distress Mood and affect are appropriate Heart: Regular rate and rhythm no rubs murmurs or extra sounds Lungs: Clear to auscultation, breathing unlabored, no rales or wheezes Abdomen: Positive bowel sounds, soft nontender to palpation, nondistended Extremities: No clubbing, cyanosis, or edema Skin: No evidence of breakdown, no evidence of rash  Neuro:  4/5 R side, 5/5 left side  Neuro: Global aphasia- attempting to communicate thru words and short phrases Motor: Unable to follow commands consistently, cannot perform formal MMT but is antigravity with resistance in all 4 ext    Assessment/Plan: 1. Functional deficits which require 3+ hours per day of interdisciplinary therapy in a comprehensive inpatient rehab setting.  Physiatrist is providing close team supervision and 24 hour management of active medical problems listed below.  Physiatrist and rehab team continue to assess barriers to discharge/monitor patient progress toward functional and medical goals   Care Tool:  Bathing    Body parts  bathed by patient: Chest,Face,Front perineal area,Abdomen,Left arm,Right arm,Right upper leg,Left upper leg,Left lower leg,Right lower leg,Buttocks   Body parts bathed by helper: Buttocks     Bathing assist Assist Level: Supervision/Verbal cueing     Upper Body Dressing/Undressing Upper body dressing   What is the patient wearing?: Pull over shirt    Upper body assist Assist Level: Set up assist    Lower Body Dressing/Undressing Lower body dressing      What is the patient wearing?: Pants,Underwear/pull up     Lower body assist Assist for lower body dressing: Supervision/Verbal cueing     Toileting Toileting    Toileting assist Assist for toileting: Supervision/Verbal cueing     Transfers Chair/bed transfer  Transfers assist     Chair/bed transfer assist level: Supervision/Verbal cueing     Locomotion Ambulation   Ambulation assist      Assist level: Supervision/Verbal cueing Assistive device: Walker-rolling Max distance: 150   Walk 10 feet activity   Assist     Assist level: Supervision/Verbal cueing Assistive device: Walker-rolling   Walk 50 feet activity   Assist Walk 50 feet with 2 turns activity did not occur: Safety/medical concerns  Assist level: Supervision/Verbal cueing Assistive device: Walker-rolling    Walk 150 feet activity   Assist Walk 150 feet activity did not occur: Safety/medical concerns  Assist level: Supervision/Verbal cueing Assistive device: Walker-rolling    Walk 10 feet on uneven surface  activity   Assist Walk 10 feet on uneven surfaces activity did not occur:  Safety/medical concerns         Wheelchair     Assist Will patient use wheelchair at discharge?: No             Wheelchair 50 feet with 2 turns activity    Assist            Wheelchair 150 feet activity     Assist           Medical Problem List and Plan: 1.  Altered mental status with aphasia secondary to left  MCA infarction due to left MCA occlusion status post stenting  Continue CIR PT, OT, SLP -tent 2/22 2.  Antithrombotics: -DVT/anticoagulation: Continue Lovenox             -antiplatelet therapy: Continue Aspirin 81 mg daily and Brilinta 90 mg twice daily 3. Pain Management:  Reports headaches at times- continue Tylenol prn 4. Mood: Provide emotional support             -antipsychotic agents: N/A 5. Neuropsych: This patient is not capable of making decisions on her own behalf. 6. Skin/Wound Care: Routine skin checks 7. Fluids/Electrolytes/Nutrition: Routine and outs            CMP 2/7 reviewed and electrolytes are stable.  8.  Poststroke dysphagia:.  Dysphagia #2 nectar liquids.per MBS upgraded to D3 thin             off TF 9.  Seizure prophylaxis.  Keppra 500 mg twice daily.  EEG negative. D/c Keppra. Continues to be seizure free.   10.  Urinary retention: Continue Urecholine 10 mg 3 times daily.  PVR ok  11.  Diet-controlled diabetes mellitus.  Hemoglobin A1c 5.4.  may d/c CBG off TF CBG (last 3)  Recent Labs    07/24/20 1631 07/24/20 2109 07/25/20 0626  GLUCAP 83 107* 104*    12.  Hyperlipidemia: Lipitor 13.  Morbid obesity status post gastric sleeve 05/24/2020.  Dietary follow-up 14.  CKD stage III.  Baseline creatinine 1.41-1.59             Cr 1.33 on 2/7 15. Abdominal pain, nausea, emesis: resolved 16. Constipation: currently on Miralax, Colace, and Senna-docusate: adjust as needed. LBM 2/20      LOS: 16 days A FACE TO FACE EVALUATION WAS PERFORMED  [name] 07/25/2020, 9:24 AM

## 2020-07-25 NOTE — Progress Notes (Signed)
Physical Therapy Discharge Summary  Patient Details  Name: Courtney Burke MRN: [id number] Date of Birth: Sep 24, 1955  Today's Date: 07/25/2020 PT Individual Time: 2458-0998 PT Individual Time Calculation (min): 58 min    Patient has met 8 of 9 long term goals due to improved activity tolerance, improved balance, improved postural control, increased strength and functional use of  right upper extremity and right lower extremity.  Patient to discharge at an ambulatory level Supervision.   Patient's care partner is independent to provide the necessary physical assistance at discharge.  Reasons goals not met: With fatigue in longer distance ambulation, pt requires verbal reminders to sit for rest as well as for maintaining safe distance from walker and maintaining RLE clearance from floor. With continuous progress demonstrated in CIR, she is expected to continue to progress with HHPT.   Recommendation:  Patient will benefit from ongoing skilled PT services in home health setting to continue to advance safe functional mobility, address ongoing impairments in strength, general activity tolerance, standing balance, overall safety awareness, and to minimize fall risk.  Equipment: RW, 3-in-1, TTB,   Reasons for discharge: treatment goals met and discharge from hospital  Patient/family agrees with progress made and goals achieved: Yes  PT Discharge Precautions/Restrictions Precautions Precautions: Fall Restrictions Weight Bearing Restrictions: No Pain Pain Assessment Pain Scale: 0-10 Pain Score: 0-No pain Vision/Perception  Vision - Assessment Eye Alignment: Within Functional Limits Perception Perception: Within Functional Limits Inattention/Neglect: Appears intact Praxis Praxis: Impaired Praxis Impairment Details: Motor planning  Cognition Overall Cognitive Status: Impaired/Different from baseline Arousal/Alertness: Awake/alert Orientation Level: Oriented to person;Oriented to  place Attention: Sustained Sustained Attention: Appears intact Awareness: Impaired Problem Solving: Impaired Self Correcting: Impaired Safety/Judgment: Impaired Comments: Pt has demonstrated good functional safety awareness during admission, however emergent awareness deficits do present potential for safety/fall risk Sensation Sensation Light Touch: Appears Intact Coordination Gross Motor Movements are Fluid and Coordinated: Yes Fine Motor Movements are Fluid and Coordinated: Yes Coordination and Movement Description: Greatly improved from eval, very mild R hemiparesis Motor  Motor Motor: Abnormal postural alignment and control;Hemiplegia Motor - Discharge Observations: mild R hemiparesis  Mobility Bed Mobility Bed Mobility: Rolling Right;Supine to Sit;Sit to Supine;Right Sidelying to Sit Rolling Right: Independent with assistive device Right Sidelying to Sit: Independent with assistive device Left Sidelying to Sit: Independent with assistive device Supine to Sit: Independent with assistive device Sit to Supine: Independent with assistive device Transfers Transfers: Sit to Stand;Stand to Sit;Stand Pivot Transfers Sit to Stand: Independent with assistive device Stand to Sit: Independent with assistive device Stand Pivot Transfers: Supervision/Verbal cueing Stand Pivot Transfer Details: Verbal cues for technique;Verbal cues for precautions/safety Transfer (Assistive device): Rolling walker Locomotion  Gait Ambulation: Yes Gait Assistance: Contact Guard/Touching assist Gait Distance (Feet): 200 Feet Assistive device: Rolling walker Gait Assistance Details: Verbal cues for precautions/safety;Verbal cues for technique;Verbal cues for gait pattern Gait Assistance Details: supervision for short distances, CGA for longer distances with vc for R step quality and improving proximity to walker Gait Gait: Yes Gait Pattern: Impaired Gait Pattern: Step-through pattern;Step-to  pattern;Decreased step length - right;Decreased hip/knee flexion - right;Decreased dorsiflexion - right Gait velocity: reduced Stairs / Additional Locomotion Stairs: Yes Stairs Assistance: Contact Guard/Touching assist Stair Management Technique: Two rails Number of Stairs: 4 Height of Stairs: 6 Ramp: Contact Guard/touching assist Curb: Contact Guard/Touching assist Wheelchair Mobility Wheelchair Mobility: No  Trunk/Postural Assessment  Cervical Assessment Cervical Assessment: Exceptions to [facility] (Forward Head) Thoracic Assessment Thoracic Assessment: Exceptions to [facility] (Rounded Shoulders) Lumbar Assessment Lumbar  Assessment: Within Functional Limits Postural Control Postural Control: Within Functional Limits  Balance Balance Balance Assessed: Yes Standardized Balance Assessment Standardized Balance Assessment: Timed Up and Go Test Timed Up and Go Test TUG: Normal TUG Normal TUG (seconds): 38.78 (using RW and supervision/ CGA) Static Sitting Balance Static Sitting - Balance Support: No upper extremity supported;Feet supported Static Sitting - Level of Assistance: 6: Modified independent (Device/Increase time) Dynamic Sitting Balance Dynamic Sitting - Balance Support: No upper extremity supported;Feet supported;During functional activity Dynamic Sitting - Level of Assistance: 5: Stand by assistance Reach (Patient is able to reach ___ inches to right, left, forward, back): reaches 8 inches Dynamic Sitting - Balance Activities: Lateral lean/weight shifting;Forward lean/weight shifting;[place];Reaching for objects;Reaching for weighted objects;Reaching across midline Sitting balance - Comments: still some difficulty to follow specific instructions for new tasks Static Standing Balance Static Standing - Balance Support: During functional activity;No upper extremity supported Static Standing - Level of Assistance: 5: Stand by assistance Dynamic Standing Balance Dynamic Standing -  Balance Support: Bilateral upper extremity supported;During functional activity Dynamic Standing - Level of Assistance: 5: Stand by assistance Dynamic Standing - Balance Activities: Lateral lean/weight shifting;Forward lean/weight shifting;Reaching for objects;Reaching for weighted objects;Reaching across midline;Kicking ball Dynamic Standing - Comments: Requires at least unilateral UE support to perform with supervision Extremity Assessment      RLE Assessment RLE Assessment: Exceptions to [facility] ([place]) General Strength Comments: grossly 4-/ 5 LLE Assessment LLE Assessment: Within Functional Limits General Strength Comments: grossly 4/5  Skilled Therapeutic Intervention Patient seated upright in w/c upon PT arrival. Patient alert and agreeable to PT session. Patient denied pain during session.  Therapeutic Activity: Bed Mobility: Patient performed supine <> sit with Mod I using bed rail to complete. No vc necessary.  Transfers: Patient performed STS and SPVT transfers with overall supervision. Car transfer performed at pt related height of daughter's car although she was unsure as to what car would arrive to pick her up. Provided verbal cues for technique. Pt is able to perform with supervision.  Gait Training:  Pt's new RW has been delivered to room and is fit to pt for her height. Patient ambulated >200 ft x2 as well as shorter distances throughout the therapy gyms. Continues to demo decreased step height on RLE. Provided verbal cues for increased hip/ knee flexion during swing phase of RLE, maintaining upright posture and close proximity to walker throughout bout. She does require a few brief standing rest breaks in order to complete longer distances. Pt requires close supervision to complete ambulation up/ down ramp and requires visual demonstration prior to performing ambulation through uneven surface of outdoor simulation. Then she is able to complete with CGA/ supervision and lift of RW with each  step.    Completes four 6" steps using BHR with supervision to ascend and sup/ intermittent CGA to descend. Guided in use of RW over 4" curb step with pt able to complete with vc throughout for technique and close supervision.   Neuromuscular Re-ed: NMR facilitated during session with focus on standing balance challenges including reaching statically and dynamically in/ out of BOS and across midline as well as to differing heights from floor height to overhead. Pt using BUE support, UUE support and no UE . Required CGA for more difficult reaches but is generally supervision to complete. Demos significant improvement in standing balance since [facility].  NMR performed for improvements in motor control and coordination, balance, sequencing, judgement, and self confidence/ efficacy in performing all aspects of mobility at  highest level of independence.    Patient seated in w/c at end of session with brakes locked, belt alarm set, and all needs within reach.     Alger Simons 07/25/2020, 4:54 PM

## 2020-07-25 NOTE — Progress Notes (Signed)
Occupational Therapy Session Note  Patient Details  Name: Courtney Burke MRN: [id number] Date of Birth: 1955-12-10  Today's Date: 07/25/2020 OT Individual Time: 6168-3729 OT Individual Time Calculation (min): 55 min    Short Term Goals: Week 1:  OT Short Term Goal 1 (Week 1): Pt will complete toileting with Min A OT Short Term Goal 1 - Progress (Week 1): Met OT Short Term Goal 2 (Week 1): Pt will don overhead shirt with CGA OT Short Term Goal 2 - Progress (Week 1): Met OT Short Term Goal 3 (Week 1): Pt will complete oral care with Min A and no more than min cues for sequencing OT Short Term Goal 3 - Progress (Week 1): Met Week 2:  OT Short Term Goal 1 (Week 2): STG = LTG 2/2 ELOS   Skilled Therapeutic Interventions/Progress Updates:    Pt greeted at time of session sitting up in wheelchair agreeable to OT session, pleasant, no s/s of pain. Pt with global aphasia and present throughout session. Needing to toilet at beginning of session, ambulated to/from bathroom and 3/3 toilet tasks including transfer with close supervision with RW. Wheelchair transport to gym for time management and stand pivot > nustep close supervision. Level 4 for 10 minutes with minimal fatigue noted. Pt also performed 3 rounds of BITS dynamic standing activities using BUEs with unilateral and no UE support at times while crossing midline to reach for objects, note that word identification was too difficult and changed activity. Pt also performed stand pivot to/from TTB in ADL room with close supervision and RW after therapist demonstration to ensure ready for DC home. Transported back to room dependent for time, up in wheelchair with alarm on call bell in reach.   Therapy Documentation Precautions:  Precautions Precautions: Fall Precaution Comments: globally aphasic, NG tube Restrictions Weight Bearing Restrictions: No     Therapy/Group: Individual Therapy  Viona Gilmore 07/25/2020, 3:52 PM

## 2020-07-25 NOTE — Progress Notes (Signed)
Speech Language Pathology Discharge Summary  Patient Details  Name: Providencia Hottenstein MRN: [id number] Date of Birth: 21-Apr-1956  Today's Date: 07/25/2020 SLP Individual Time: 9024-0973 SLP Individual Time Calculation (min): 42 min   Skilled Therapeutic Interventions:  Pt was seen for skilled ST targeting communication goals addressing both expressive and receptive language skills. Pt identified objects via pointing when provided either an attribute (adjective) or object function with 50% accuracy. She was more accurate when provided object function; object function and other semantic cues were helping in assisting her to correct errors when only provided with adjective descriptor. She participated in a phrase completion task, selecting the correct word (from field of 3) to complete a phrase with 8/15 accuracy via pointing or verbalizing provided Mod A semantic and phonemic cues. Verbalizations were ~70% accurate. Pt's generative spontaneous speech still mostly consists of a few automatic phrases and jargon. Pt left sitting in chair with seat alarm on and needs within reach. Continue per current plan of care.    Patient has met 5 of 6 long term goals.  Patient to discharge at overall Min;Mod;Max level.  Reasons goals not met: increased Max A cueing required for functional verbal communication   Clinical Impression/Discharge Summary:   Pt made functional gains and met 5 out of 6 long term goals this admission. 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 Her aphasia is characterized by both expressive and receptive language deficits, although receptive deficits have been improving over the last 10 days. Therefore, her expressive language deficits remain more severe than receptive, characterized by jargon and more recently some phonemic and semantic paraphasias. She is able to increase the accuracy in verbal output with use of  phonemic models by clinician and orthographic cues. She is becoming more aware of verbal errors, but this is still not 100% consistent. Pt has also struggled to use a basic communication board accurately, and is using mostly gestures, eye gaze, and facial expression to communicate her basic wants and needs. 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 Her cognition has been difficult to fully assess due to severity of language deficits, but she does recognize her therapists from day to day (ex: waves at SLP in hallway). Emergent awareness and complex problem solving deficits are evident. As a result, pt will require 24/7 supervision for her greatest safety at discharge. Pt has made excellent oral and pharyngeal swallow phrase improvement and is consuming a dysphagia 3 (mechanical soft) texture diet with thin liquids with only Supervision-Min A verbal cues for use of compensatory safe swallowing strategies. Although excellent improvement made while in CIR, given continues cognitive communication and mild oral swallow deficits still present, recommend pt continue to receive skilled ST services upon discharge. Pt and family education is complete at this time.   Care Partner:  Caregiver Able to Provide Assistance: Yes  Type of Caregiver Assistance: Cognitive  Recommendation:  24 hour supervision/assistance;Outpatient SLP;Home Health SLP  Rationale for SLP Follow Up: Maximize functional communication;Maximize cognitive function and independence;Reduce caregiver burden;Maximize swallowing safety   Equipment: none   Reasons for discharge: Discharged from hospital   Patient/Family Agrees with Progress Made and Goals Achieved: Yes    Arbutus Leas 07/25/2020, 12:25 PM

## 2020-07-25 NOTE — Progress Notes (Signed)
Occupational Therapy Session Note  Patient Details  Name: Courtney Burke MRN: [id number] Date of Birth: Apr 20, 1956  Today's Date: 07/25/2020 OT Individual Time: 1003-1030 OT Individual Time Calculation (min): 27 min   Short Term Goals: Week 2:  OT Short Term Goal 1 (Week 2): STG = LTG 2/2 ELOS  Skilled Therapeutic Interventions/Progress Updates:    Pt greeted in bed with no s/s pain. When asked if she needed to use the restroom, pt initiated supine<sit. She pointed to the dresser drawer and she was able to express which clothing items she wanted to wear today. When OT handed her clothes, pt draped them over the walker and transferred to the toilet using RW with supervision assist. Pt with continent B+B void. Completed 3/3 components of toileting tasks with supervision. Pt initiated returning to EOB (after she used hand sanitizer) where she then dressed at sit<stand level using RW and also completed grooming tasks with setup assist. Pt put on her wig and then stood at the sink to complete oral care with supervision for standing balance. Vcs during all functional ambulation to ensure Rt foot clearance due to weakness/inattention. She transferred to the w/c and was left with all needs within reach and safety belt fastened. Tx focus placed on ADL retraining, functional transfers, and dynamic balance.   Therapy Documentation Precautions:  Precautions Precautions: Fall Precaution Comments: globally aphasic, NG tube Restrictions Weight Bearing Restrictions: No Pain: Pain Assessment Pain Scale: 0-10 Pain Score: 0-No pain ADL: ADL Eating: Supervision/safety Where Assessed-Eating: Edge of bed Grooming: Supervision/safety Where Assessed-Grooming: Standing at sink Upper Body Bathing: Supervision/safety Where Assessed-Upper Body Bathing: Shower Lower Body Bathing: Supervision/safety Where Assessed-Lower Body Bathing: Shower Upper Body Dressing: Supervision/safety Where Assessed-Upper Body  Dressing: Edge of bed Lower Body Dressing: Supervision/safety Where Assessed-Lower Body Dressing: Edge of bed Toileting: Supervision/safety Where Assessed-Toileting: Glass blower/designer: Close supervision Toilet Transfer Method: Counselling psychologist: Grab bars,Bedside Neurosurgeon: Close supervison Clinical cytogeneticist Method: Administrator, arts: Facilities manager: Close supervision Social research officer, government Method: Heritage manager: Radio broadcast assistant     Therapy/Group: Individual Therapy  Skeet Simmer 07/25/2020, 12:35 PM

## 2020-07-25 NOTE — Discharge Instructions (Signed)
STROKE/[name] DISCHARGE INSTRUCTIONS SMOKING Cigarette smoking nearly doubles your risk of having a stroke & is the single most alterable risk factor  If you smoke or have smoked in the last 12 months, you are advised to quit smoking for your health.  Most of the excess cardiovascular risk related to smoking disappears within a year of stopping.  Ask you doctor about anti-smoking medications  [place] Quit Line: 1-800-QUIT NOW  Free Smoking Cessation Classes [phone]  CHOLESTEROL Know your levels; limit fat & cholesterol in your diet  Lipid Panel     Component Value Date/Time   CHOL 143 07/01/2020 0513   TRIG 172 (H) 07/01/2020 0513   TRIG 164 (H) 07/01/2020 0513   HDL 41 07/01/2020 0513   CHOLHDL 3.5 07/01/2020 0513   VLDL 34 07/01/2020 0513   LDLCALC 68 07/01/2020 0513      Many patients benefit from treatment even if their cholesterol is at goal.  Goal: Total Cholesterol (CHOL) less than 160  Goal:  Triglycerides (TRIG) less than 150  Goal:  HDL greater than 40  Goal:  LDL (LDLCALC) less than 100   BLOOD PRESSURE American Stroke Association blood pressure target is less that 120/80 mm/Hg  Your discharge blood pressure is:  BP: 115/71  Monitor your blood pressure  Limit your salt and alcohol intake  Many individuals will require more than one medication for high blood pressure  DIABETES (A1c is a blood sugar average for last 3 months) Goal HGBA1c is under 7% (HBGA1c is blood sugar average for last 3 months)  Diabetes: No known diagnosis of diabetes    Lab Results  Component Value Date   HGBA1C 5.4 07/01/2020     Your HGBA1c can be lowered with medications, healthy diet, and exercise.  Check your blood sugar as directed by your physician  Call your physician if you experience unexplained or low blood sugars.  PHYSICAL ACTIVITY/REHABILITATION Goal is 30 minutes at least 4 days per week  Activity: Increase activity slowly, Therapies: Physical Therapy: Home  Health Return to work:   Activity decreases your risk of heart attack and stroke and makes your heart stronger.  It helps control your weight and blood pressure; helps you relax and can improve your mood.  Participate in a regular exercise program.  Talk with your doctor about the best form of exercise for you (dancing, walking, swimming, cycling).  DIET/WEIGHT Goal is to maintain a healthy weight  Your discharge diet is:  Diet Order            DIET DYS 2 Room service appropriate? No; Fluid consistency: Nectar Thick  Diet effective now                 liquids Your height is:  Height: 5\' 2"  (157.5 cm) Your current weight is: Weight: 89.6 kg Your Body Mass Index (BMI) is:  BMI (Calculated): 36.12  Following the type of diet specifically designed for you will help prevent another stroke.  Your goal weight range is:    Your goal Body Mass Index (BMI) is 19-24.  Healthy food habits can help reduce 3 risk factors for stroke:  High cholesterol, hypertension, and excess weight.  RESOURCES Stroke/Support Group:  Call [phone]   STROKE EDUCATION PROVIDED/REVIEWED AND GIVEN TO PATIENT Stroke warning signs and symptoms How to activate emergency medical system (call 911). Medications prescribed at discharge. Need for follow-up after discharge. Personal risk factors for stroke. Pneumonia vaccine given:  Flu vaccine given:  My questions  have been answered, the writing is legible, and I understand these instructions.  I will adhere to these goals & educational materials that have been provided to me after my discharge from the hospital.   Inpatient Rehab Discharge Instructions  Courtney Burke Discharge date and time: No discharge date for patient encounter.   Activities/Precautions/ Functional Status: Activity: As tolerated Diet: Dysphagia #3 thin liquids Wound Care: Routine skin checks Functional status:  ___ No restrictions     ___ Walk up steps independently ___ 24/7  supervision/assistance   ___ Walk up steps with assistance ___ Intermittent supervision/assistance  ___ Bathe/dress independently ___ Walk with walker     _x__ Bathe/dress with assistance ___ Walk Independently    ___ Shower independently ___ Walk with assistance    ___ Shower with assistance ___ No alcohol     ___ Return to work/school ________  COMMUNITY REFERRALS UPON DISCHARGE:    Home Health:   PT     OT     ST                      Agency: [facility]  Phone: [phone]    Medical Equipment/Items Ordered: Wheelchair, Rolling Walker, Tub Transfer [place], [place]                                                 Agency/Supplier: [employer]   Special Instructions: No driving smoking or alcohol  Follow Dr. Curt Bears in regards to loop recorder placement [phone].   My questions have been answered and I understand these instructions. I will adhere to these goals and the provided educational materials after my discharge from the hospital.  Patient/Caregiver Signature _______________________________ Date __________  Clinician Signature _______________________________________ Date __________  Please bring this form and your medication list with you to all your follow-up doctor's appointments.

## 2020-07-26 LAB — GLUCOSE, CAPILLARY: Glucose-Capillary: 107 mg/dL — ABNORMAL HIGH (ref 70–99)

## 2020-07-26 NOTE — Plan of Care (Signed)
  Problem: RH Balance Goal: LTG Patient will maintain dynamic standing balance (PT) Description: LTG:  Patient will maintain dynamic standing balance with assistance during mobility activities (PT) Outcome: Completed/Met Flowsheets (Taken 07/26/2020 0739) LTG: Pt will maintain dynamic standing balance during mobility activities with:: Supervision/Verbal cueing   Problem: Sit to Stand Goal: LTG:  Patient will perform sit to stand with assistance level (PT) Description: LTG:  Patient will perform sit to stand with assistance level (PT) 07/26/2020 0744 by Alger Simons, PT Outcome: Completed/Met Flowsheets (Taken 07/26/2020 0744) LTG: PT will perform sit to stand in preparation for functional mobility with assistance level: Independent with assistive device 07/26/2020 0739 by Alger Simons, PT Flowsheets (Taken 07/26/2020 [phone]) LTG: PT will perform sit to stand in preparation for functional mobility with assistance level: Independent with assistive device   Problem: RH Bed Mobility Goal: LTG Patient will perform bed mobility with assist (PT) Description: LTG: Patient will perform bed mobility with assistance, with/without cues (PT). Outcome: Completed/Met Flowsheets (Taken 07/26/2020 0739) LTG: Pt will perform bed mobility with assistance level of: Independent with assistive device    Problem: RH Bed to Chair Transfers Goal: LTG Patient will perform bed/chair transfers w/assist (PT) Description: LTG: Patient will perform bed to chair transfers with assistance (PT). Outcome: Completed/Met Flowsheets (Taken 07/10/2020 1247 by Dub Amis, PT) LTG: Pt will perform Bed to Chair Transfers with assistance level: Supervision/Verbal cueing   Problem: RH Car Transfers Goal: LTG Patient will perform car transfers with assist (PT) Description: LTG: Patient will perform car transfers with assistance (PT). Outcome: Completed/Met Flowsheets (Taken 07/10/2020 1247 by Dub Amis, PT) LTG: Pt  will perform car transfers with assist:: Supervision/Verbal cueing   Problem: RH Ambulation Goal: LTG Patient will ambulate in controlled environment (PT) Description: LTG: Patient will ambulate in a controlled environment, # of feet with assistance (PT). Outcome: Completed/Met Flowsheets (Taken 07/10/2020 1247 by Dub Amis, PT) LTG: Pt will ambulate in controlled environ  assist needed:: Supervision/Verbal cueing LTG: Ambulation distance in controlled environment: with LRAD about 100 feet Goal: LTG Patient will ambulate in home environment (PT) Description: LTG: Patient will ambulate in home environment, # of feet with assistance (PT). Outcome: Completed/Met Flowsheets (Taken 07/10/2020 1247 by Dub Amis, PT) LTG: Pt will ambulate in home environ  assist needed:: Supervision/Verbal cueing LTG: Ambulation distance in home environment: with LRAD about 75 feet   Problem: RH Stairs Goal: LTG Patient will ambulate up and down stairs w/assist (PT) Description: LTG: Patient will ambulate up and down # of stairs with assistance (PT) Outcome: Completed/Met Flowsheets Taken 07/26/2020 0739 by Alger Simons, PT LTG: Pt will ambulate up/down stairs assist needed:: Supervision/Verbal cueing Taken 07/10/2020 1247 by Dub Amis, PT LTG: Pt will  ambulate up and down number of stairs: 4 stairs with B rails

## 2020-07-26 NOTE — Progress Notes (Signed)
Inpatient Rehabilitation Care Coordinator Discharge Note  The overall goal for the admission was met for:   Discharge location: Yes, Home   Length of Stay: Yes, 17 Days  Discharge activity level: Yes,  Patient to discharge at an ambulatory level Supervision  Home/community participation: Yes  Services provided included: MD, RD, PT, OT, SLP, RN, CM, TR, Pharmacy, [place]: Private Insurance: [facility] Medicare  Choices offered to/list presented to: [place] (Patient Daughter)  Follow-up services arranged: Home Health: [facility]   Comments (or additional information): PT OT [facility] Wheelchair, [place], [place], [facility]  Patient/Family verbalized understanding of follow-up arrangements: Yes  Individual responsible for coordination of the follow-up plan: [name] (daughter) [phone]  Confirmed correct DME delivered: Dyanne Iha 07/26/2020    Dyanne Iha

## 2020-07-26 NOTE — Progress Notes (Signed)
Patient discharged home with family.  Walker delivered to hospital and wheel chair will be delivered home.

## 2020-07-26 NOTE — Plan of Care (Signed)
  Problem: RH Ambulation Goal: LTG Patient will ambulate in community environment (PT) Description: LTG: Patient will ambulate in community environment, # of feet with assistance (PT). Outcome: Progressing Flowsheets Taken 07/26/2020 0739 by Alger Simons, PT LTG: Pt will ambulate in community environ  assist needed:: Contact Guard/Touching assist Taken 07/10/2020 1247 by Dub Amis, PT LTG: Ambulation distance in community environment: in the community greater than 150 feet Note: Will require add'l therapy with HHPT to reach goal.

## 2020-07-26 NOTE — Progress Notes (Signed)
[facility] PHYSICAL MEDICINE & REHABILITATION PROGRESS NOTE  Subjective/Complaints:  Remains aphasic but expressing simple ideas , short phrase level   ROS: limited due to language/communication    Objective: Vital Signs: Blood pressure (!) 109/53, pulse 70, temperature 97.7 F (36.5 C), resp. rate 18, height 5\' 2"  (1.575 m), weight 90.8 kg, last menstrual period 09/28/2012, SpO2 100 %. No results found. No results for input(s): WBC, HGB, HCT, PLT in the last 72 hours. No results for input(s): NA, K, CL, CO2, GLUCOSE, BUN, CREATININE, CALCIUM in the last 72 hours.  Intake/Output Summary (Last 24 hours) at 07/26/2020 0826 Last data filed at 07/26/2020 2841 Gross per 24 hour  Intake 318 ml  Output -  Net 318 ml    Physical Exam: BP (!) 109/53 (BP Location: Left Arm)   Pulse 70   Temp 97.7 F (36.5 C)   Resp 18   Ht 5\' 2"  (1.575 m)   Wt 90.8 kg   LMP 09/28/2012   SpO2 100%   BMI 36.61 kg/m    General: No acute distress Mood and affect are appropriate Heart: Regular rate and rhythm no rubs murmurs or extra sounds Lungs: Clear to auscultation, breathing unlabored, no rales or wheezes Abdomen: Positive bowel sounds, soft nontender to palpation, nondistended Extremities: No clubbing, cyanosis, or edema Skin: No evidence of breakdown, no evidence of rash  Neuro:  4/5 R side, 5/5 left side  Neuro: Global aphasia- attempting to communicate thru words and short phrases Motor: Unable to follow commands consistently, cannot perform formal MMT but is antigravity with resistance in all 4 ext    Assessment/Plan: 1. Functional deficits due to Left MCA infarct  Stable for D/C today F/u PCP in 3-4 weeks F/u PM&R 2 weeks See D/C summary See D/C instructions   Care Tool:  Bathing    Body parts bathed by patient: Chest,Face,Front perineal area,Abdomen,Left arm,Right arm,Right upper leg,Left upper leg,Left lower leg,Right lower leg,Buttocks   Body parts bathed by helper:  Buttocks     Bathing assist Assist Level: Supervision/Verbal cueing (per most recent staff documentation)     Upper Body Dressing/Undressing Upper body dressing   What is the patient wearing?: Pull over shirt    Upper body assist Assist Level: Set up assist    Lower Body Dressing/Undressing Lower body dressing      What is the patient wearing?: Pants,Underwear/pull up     Lower body assist Assist for lower body dressing: Supervision/Verbal cueing     Toileting Toileting    Toileting assist Assist for toileting: Supervision/Verbal cueing     Transfers Chair/bed transfer  Transfers assist     Chair/bed transfer assist level: Supervision/Verbal cueing     Locomotion Ambulation   Ambulation assist      Assist level: Contact Guard/Touching assist Assistive device: Walker-rolling Max distance: 200 ft   Walk 10 feet activity   Assist     Assist level: Supervision/Verbal cueing Assistive device: Walker-rolling   Walk 50 feet activity   Assist Walk 50 feet with 2 turns activity did not occur: Safety/medical concerns  Assist level: Supervision/Verbal cueing Assistive device: Walker-rolling    Walk 150 feet activity   Assist Walk 150 feet activity did not occur: Safety/medical concerns  Assist level: Contact Guard/Touching assist Assistive device: Walker-rolling    Walk 10 feet on uneven surface  activity   Assist Walk 10 feet on uneven surfaces activity did not occur: Safety/medical concerns   Assist level: Contact Guard/Touching assist Assistive device: Walker-rolling  Wheelchair     Assist Will patient use wheelchair at discharge?: No             Wheelchair 50 feet with 2 turns activity    Assist            Wheelchair 150 feet activity     Assist           Medical Problem List and Plan: 1.  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  Antithrombotics: -DVT/anticoagulation: Continue Lovenox             -antiplatelet therapy: Continue Aspirin 81 mg daily and Brilinta 90 mg twice daily 3. Pain Management:  Reports headaches at times- continue Tylenol prn 4. Mood: Provide emotional support             -antipsychotic agents: N/A 5. Neuropsych: This patient is not capable of making decisions on her own behalf. 6. Skin/Wound Care: Routine skin checks 7. Fluids/Electrolytes/Nutrition: Routine and outs            CMP 2/7 reviewed and electrolytes are stable.  8.  Poststroke dysphagia:.  Dysphagia #2 nectar liquids.per MBS upgraded to D3 thin             off TF 9.  Seizure prophylaxis.  Keppra 500 mg twice daily.  EEG negative. D/c Keppra. Continues to be seizure free.   10.  Urinary retention: Continue Urecholine 10 mg 3 times daily.  PVR ok  11.  Diet-controlled diabetes mellitus.  Hemoglobin A1c 5.4.  may d/c CBG off TF CBG (last 3)  Recent Labs    07/25/20 1606 07/25/20 2109 07/26/20 0559  GLUCAP 106* 102* 107*    12.  Hyperlipidemia: Lipitor 13.  Morbid obesity status post gastric sleeve 05/24/2020.  Dietary follow-up 14.  CKD stage III.  Baseline creatinine 1.41-1.59             Cr 1.33 on 2/7 15. Abdominal pain, nausea, emesis: resolved 16. Constipation: currently on Miralax, Colace, and Senna-docusate: adjust as needed. LBM 2/20      LOS: 17 days A FACE TO FACE EVALUATION WAS PERFORMED  Court[facility] 07/26/2020, 8:26 AM

## 2020-07-28 ENCOUNTER — Telehealth: Payer: Self-pay | Admitting: *Deleted

## 2020-07-28 NOTE — Telephone Encounter (Signed)
Transitional Care call-daughter Courtney Burke    1. Are you/is patient experiencing any problems since coming home? Are there any questions regarding any aspect of care? NO she is doing very well.  Assisted walking and writing. Still struggling with speech. 2. Are there any questions regarding medications administration/dosing? Are meds being taken as prescribed? Patient should review meds with caller to confirm No questions, she has all medications except Brillinta. It is to be picked up at the [facility] but she is the sole care giver and has not been able to leave her mother to go get the medication.  She plans to go in the morning if weather permits. 3. Have there been any falls? No falls 4. Has Home Health been to the house and/or have they contacted you? If not, have you tried to contact them? Can we help you contact them? Yes PT OT has been out and ST due this afternoon. 5. Are bowels and bladder emptying properly? Are there any unexpected incontinence issues? If applicable, is patient following bowel/bladder programs? No problems 6. Any fevers, problems with breathing, unexpected pain? NO 7. Are there any skin problems or new areas of breakdown?NO 8. Has the patient/family member arranged specialty MD follow up (ie cardiology/neurology/renal/surgical/etc)?  Can we help arrange? I have given her appt to come see Danella Sensing NP for Dr Letta Pate on 08/04/20, and alerted her to the cardiology appt one hour later on the third floor the same day. Interventional Radiology is arranged for 08/23/20 and I have given her the number to [facility]. 9. Does the patient need any other services or support that we can help arrange? No 10. Are caregivers following through as expected in  assisting the patient? YES 11. Has the patient quit smoking, drinking alcohol, or using drugs as recommended? YES  Appointment Thursday 08/04/20 @10 :20 arrive by 10:00 to see Danella Sensing NP then Dr Letta Pate thereafter. Alerted to look for a packet in the mail from our office with papers to fill out. Address reviewed [address] [phone]

## 2020-08-02 ENCOUNTER — Telehealth (HOSPITAL_COMMUNITY): Payer: Self-pay

## 2020-08-02 NOTE — Telephone Encounter (Signed)
Pharmacy Transitions of Care Follow-up Telephone Call  Date of discharge: 07/25/20 Discharge Diagnosis: stroke  How have you been since you were released from the hospital? Patient has daughter taking care of her, spoke with daughter Courtney Burke. She is trying to keep her mom mobile and her mom can swallow her food now without being crushed. She is experiencing headaches now and OTC medication helped but told daughter to let her provider know about the headaches since OTCs can contain NSAIDs or high dose aspirin to increase bleeding risk.   Medication changes made at discharge: yes  Medication changes obtained and verified? yes    Medication Accessibility:  Home Pharmacy: [facility]  Was the patient provided with refills on discharged medications? yes   Have all prescriptions been transferred from [facility] to home pharmacy? yes  . Is the patient able to afford medications? yes    Medication Review:  TICAGRELOR (BRILINTA) Ticagrelor 90 mg BID on 07/25/20.  - Discussed importance of taking medication around the same time every day, - Advised patient of medications to avoid (NSAIDs, aspirin maintenance doses>100 mg daily) - Educated that Tylenol (acetaminophen) will be the preferred analgesic to prevent risk of bleeding  - Emphasized importance of monitoring for signs and symptoms of bleeding (abnormal bruising, prolonged bleeding, nose bleeds, bleeding from gums, discolored urine, black tarry stools)  - Educated patient to notify doctor if shortness of breath or abnormal heartbeat occur - Advised patient to alert all providers of antiplatelet therapy prior to starting a new medication or having a procedure   Follow-up Appointments:  Has follow up with Dr. Marcello Moores in Phys Medicine on 08/04/20  If their condition worsens, is the pt aware to call PCP or go to the Emergency Dept.? yes  Final Patient Assessment: Patient is improving with help from family. Has follow up scheduled  and refills at home pharmacy.

## 2020-08-04 ENCOUNTER — Ambulatory Visit (INDEPENDENT_AMBULATORY_CARE_PROVIDER_SITE_OTHER): Payer: Medicare Other | Admitting: Student

## 2020-08-04 ENCOUNTER — Encounter: Payer: Self-pay | Admitting: Student

## 2020-08-04 ENCOUNTER — Encounter: Payer: Medicare Other | Attending: Registered Nurse | Admitting: Registered Nurse

## 2020-08-04 ENCOUNTER — Other Ambulatory Visit: Payer: Self-pay

## 2020-08-04 ENCOUNTER — Encounter: Payer: Self-pay | Admitting: Registered Nurse

## 2020-08-04 VITALS — BP 109/73 | HR 100 | Temp 98.6°F | Ht 62.0 in | Wt 202.0 lb

## 2020-08-04 VITALS — BP 116/74 | HR 86 | Ht 62.0 in | Wt 202.0 lb

## 2020-08-04 DIAGNOSIS — I639 Cerebral infarction, unspecified: Secondary | ICD-10-CM | POA: Diagnosis not present

## 2020-08-04 DIAGNOSIS — E785 Hyperlipidemia, unspecified: Secondary | ICD-10-CM | POA: Diagnosis present

## 2020-08-04 DIAGNOSIS — I63512 Cerebral infarction due to unspecified occlusion or stenosis of left middle cerebral artery: Secondary | ICD-10-CM | POA: Insufficient documentation

## 2020-08-04 NOTE — Progress Notes (Signed)
Subjective:    Patient ID: Courtney Burke, female    DOB: Jul 07, 1955, 65 y.o.   MRN: [id number]  HPI: Courtney Burke is a 65 y.o. female who is here for Transitional care visit for follow up of her Left Middle Cerebral Artery Stroke and Dyslipidemia. She presented to [facility] on 06/30/2020 via EMS as a Code Stroke. Neurology Consulted.  CT Head WO Contrast:  IMPRESSION: 1. Acute Left MCA territory infarct. ASPECTS 6. No hemorrhage or mass effect at this time. 2. The above discussed by telephone with Dr. Curly Shores on 06/30/2020 at 07:32 . 3. 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 CTA Head and Neck:  IMPRESSION: 1. Positive for emergent large vessel occlusion: Left MCA origin. Some left MCA branch reconstitution.  2. CT Perfusion underestimates infarct core (ASPECTS 6), which is estimated at 11-22 mL, subsequent estimated penumbra of 60 to 67 mL.  3. No other large vessel occlusion. But intracranial atherosclerosis with: - Severe stenosis Right ACA origin. - moderate stenosis Right MCA M2 branch origin. - mild stenosis Right MCA M1, also Vertebrobasilar junction.  4. Cervical carotid atherosclerosis without stenosis.  Per Dr Olena Leatherwood Summary:   She underwent thrombectomy and stent placement by interventional radiologist on 06/30/2020. MR Brain WO Contrast:  IMPRESSION: Acute infarct left MCA territory involving the basal ganglia as well as the left temporal, frontal, and parietal lobes. Small area of hemorrhage in the left posterior temporal lobe. Additional small areas of acute infarct in the occipital white matter bilaterally and right cerebellum.  Left MCA stent appears patent. Moderate intracranial atherosclerotic disease as above.  [facility] was maintained on aspirin 81 mg daily and Brilinta for CVA prophylaxis.  Courtney Burke was admitted to inpatient rehabilitation on 07/09/2020 and discharged home on 07/26/2020. She  is receiving home health therapy with [facility]. [facility]ney Burke daughter and Courtney Burke shakes her head no, for any pain She rated pain 3 on Health and History form. Daughter reports she has a good appetite.   Courtney Burke with expressive aphasia, she was able to state her name and daughter name only, otherwise her languaged was garbled.   Pain Inventory Average Pain 9 Pain Right Now 3 My pain is intermittent and stabbing  LOCATION OF PAIN  Stomach, thigh, head  BOWEL Number of stools per week: 7 Oral laxative use No  Type of laxative NONE Enema or suppository use No  History of colostomy No  Incontinent   BLADDER Normal In and out cath, frequency  N/A Able to self cath No  Bladder incontinence No  Frequent urination No  Leakage with coughing No  Difficulty starting stream No  Incomplete bladder emptying No    Mobility use a walker how many minutes can you walk? Unknown ability to climb steps?  no do you drive?  no use a wheelchair Do you have any goals in this area?  yes  Function employed # of hrs/week On medical leave. Worked as a Marine scientist I need assistance with the following:  dressing, bathing, meal prep, household duties and shopping Do you have any goals in this area?  yes  Neuro/Psych weakness trouble walking confusion anxiety  Prior Studies Any changes since last visit?  no New Patient  Physicians involved in your care Any changes since last visit?  no New Patient   History reviewed. No pertinent family history. Social History   Socioeconomic History  . Marital status: Divorced  Spouse name: Not on file  . Number of children: Not on file  . Years of education: Not on file  . Highest education level: Not on file  Occupational History  . Not on file  Tobacco Use  . Smoking status: Never Smoker  . Smokeless tobacco: Never Used  Vaping Use  . Vaping Use: Never used  Substance and Sexual Activity  . Alcohol use: No  . Drug use: No  .  Sexual activity: Not on file  Other Topics Concern  . Not on file  Social History Narrative  . Not on file   Social Determinants of Health   Financial Resource Strain: Not on file  Food Insecurity: Not on file  Transportation Needs: Not on file  Physical Activity: Not on file  Stress: Not on file  Social Connections: Not on file   Past Surgical History:  Procedure Laterality Date  . ANKLE SURGERY    . CARPAL TUNNEL RELEASE    . carpel tunnel    . IR CT HEAD LTD  06/30/2020  . IR INTRA CRAN STENT  06/30/2020  . IR PERCUTANEOUS ART THROMBECTOMY/INFUSION INTRACRANIAL INC DIAG ANGIO  06/30/2020  . RADIOLOGY WITH ANESTHESIA N/A 06/30/2020   Procedure: RADIOLOGY WITH ANESTHESIA;  Surgeon: Radiologist, Medication, MD;  Location: [facility];  Service: Radiology;  Laterality: N/A;   Past Medical History:  Diagnosis Date  . Acute ischemic left MCA stroke ([place]) 06/30/2020  . Acute ischemic stroke ([place])   . Acute stroke due to occlusion of left middle cerebral artery ([place]) 06/30/2020  . Age-related nuclear cataract of both eyes 09/28/2015  . Arthralgia of multiple joints 09/28/2015  . Asthma   . Borderline diabetes   . Cerebrovascular accident (CVA) ([place])   . Chronic bilateral thoracic back pain 06/05/2018  . Chronic renal insufficiency, stage 1 08/11/2014  . Diabetes mellitus type 2 in obese ([place])   . Dyslipidemia   . Dysphagia, post-stroke   . Erosive gastritis 07/20/2019   Formatting of this note might be different from the original. On EGD 07/2019  . Esophagitis 07/20/2019   Formatting of this note might be different from the original. Added automatically from request for surgery [id number]  Formatting of this note might be different from the original. On EGD 07/2019  . Essential hypertension 09/28/2015  . Essential thrombocytosis ([place]) 09/28/2015  . Gallstones 08/18/2019   Formatting of this note might be different from the original. Added automatically from request for surgery [id number]  .  Gastroesophageal reflux disease without esophagitis 09/28/2015  . 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 Hyperlipidemia, unspecified 03/16/2013  . Hypertension   . Keratoconjunctivitis sicca of both eyes not specified as Sjogren's 09/28/2015  . Left middle cerebral artery stroke ([place]) 07/09/2020  . Morbid obesity ([place]) 05/21/2013  . Stage 3b chronic kidney disease ([place])   . Status post gastric bypass for obesity 09/28/2015   BP 109/73   Pulse 100   Temp 98.6 F (37 C)   Ht 5\' 2"  (1.575 m)   Wt 202 lb (91.6 kg)   LMP 09/28/2012   SpO2 98%   BMI 36.95 kg/m   Opioid Risk Score:   Fall Risk Score:  `1  Depression screen PHQ 2/9  No flowsheet data found. Review of Systems  Gastrointestinal: Positive for abdominal pain and vomiting.  Neurological: Positive for weakness.  All other systems reviewed and are negative.      Objective:   Physical Exam Vitals and nursing note reviewed.  Constitutional:  Appearance: Normal appearance.  Cardiovascular:     Rate and Rhythm: Normal rate and regular rhythm.     Pulses: Normal pulses.     Heart sounds: Normal heart sounds.  Pulmonary:     Effort: Pulmonary effort is normal.     Breath sounds: Normal breath sounds.  Musculoskeletal:     Cervical back: Normal range of motion and neck supple.     Comments: Normal Muscle Bulk and Muscle Testing Reveals:  Upper Extremities: Right: Decreased ROM 90 Degrees  and Muscle Strength 3/5 Left Upper Extremity: Full ROM and Muscle Strength 5/5 Lower Extremities: Right Lower Extremity: Decreased ROM and Muscle Strength 4/5 Left Lower Extremity: Facial Grimacing Noted with Left Foot Flexion: Daughter Reports old injury Arises from chair slowly, using walker for support: She was dragging her right foot, When given instruction to pick up her right foot, she followed instruction  Transferred to wheelchair   Skin:    General: Skin is warm and dry.  Neurological:     Mental Status: She is alert  and oriented to person, place, and time.  Psychiatric:        Mood and Affect: Mood normal.        Behavior: Behavior normal.           Assessment & Plan:  1. Left Middle Cerebral Artery Stroke: Placed a call to obtain Neurology Consult: No answer left message to call Courtney Burke daughter for an appointment. Continue Outpatient Therapy with [facility]. Continue to monitor.  2.Dyslipidemia: Continue current medication regimen. PCP following.   F/U in 4- 6 weeks with Dr Letta Pate

## 2020-08-04 NOTE — Progress Notes (Signed)
PCP:  [name], PA-C Primary Cardiologist: No primary care provider on file. Electrophysiologist: Dr. Binnie Kand Courtney Burke is a 65 y.o. female seen today for Dr. Curt Bears to discuss loop and monitoring post stroke.   Courtney Burke was admitted on 06/30/2020 admitted for confusion, seizure-like activity, drooling, bowel incontinence, and left gaze and right-sided weakness No tPA given due to outside window. Status post IR for left MCA occlusion.  PMHx includes HTN, DM, CKD (III), obesity (h/o gastric bypass 05/2020)  Neurology noted: left MCA infarct due to left MCA occlusion status post IR and MCA stenting with [id number], embolic pattern secondary to unclear source.  she has undergone workup for stroke including echocardiogram and carotid angio.  The patient has been monitored on telemetry which has demonstrated sinus rhythm with no arrhythmias.   Echocardiogram demonstrated   IMPRESSIONS  1. Left ventricular ejection fraction, by estimation, is 60 to 65%. The  left ventricle has normal function. The left ventricle has no regional  wall motion abnormalities. There is moderate left ventricular hypertrophy.  Left ventricular diastolic  parameters are consistent with Grade I diastolic dysfunction (impaired  relaxation).  2. Right ventricular systolic function is normal. The right ventricular  size is normal. There is normal pulmonary artery systolic pressure. The  estimated right ventricular systolic pressure is 26.7 mmHg.  3. The mitral valve is grossly normal. Trivial mitral valve  regurgitation.  4. The aortic valve is tricuspid. Aortic valve regurgitation is not  visualized.  5. The inferior vena cava is normal in size with greater than 50%  respiratory variability, suggesting right atrial pressure of 3 mmHg.   Comparison(s): No prior Echocardiogram.   Pt completed CIR 07/26/2020 and is now at home. She is able to understand and communicate very well  now post stroke, she does still have some issue with word finding and some residual weakness.  She denies h/o palpitations or syncope.  Past Medical History:  Diagnosis Date  . Acute ischemic left MCA stroke ([place]) 06/30/2020  . Acute ischemic stroke ([place])   . Acute stroke due to occlusion of left middle cerebral artery ([place]) 06/30/2020  . Age-related nuclear cataract of both eyes 09/28/2015  . Arthralgia of multiple joints 09/28/2015  . Asthma   . Borderline diabetes   . Cerebrovascular accident (CVA) ([place])   . Chronic bilateral thoracic back pain 06/05/2018  . Chronic renal insufficiency, stage 1 08/11/2014  . Diabetes mellitus type 2 in obese ([place])   . Dyslipidemia   . Dysphagia, post-stroke   . Erosive gastritis 07/20/2019   Formatting of this note might be different from the original. On EGD 07/2019  . Esophagitis 07/20/2019   Formatting of this note might be different from the original. Added automatically from request for surgery [id number]  Formatting of this note might be different from the original. On EGD 07/2019  . Essential hypertension 09/28/2015  . Essential thrombocytosis ([place]) 09/28/2015  . Gallstones 08/18/2019   Formatting of this note might be different from the original. Added automatically from request for surgery [id number]  . Gastroesophageal reflux disease without esophagitis 09/28/2015  . 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 Hyperlipidemia, unspecified 03/16/2013  . Hypertension   . Keratoconjunctivitis sicca of both eyes not specified as Sjogren's 09/28/2015  . Left middle cerebral artery stroke ([place]) 07/09/2020  . Morbid obesity ([place]) 05/21/2013  . Stage 3b chronic kidney disease ([place])   . Status post gastric bypass for obesity 09/28/2015   Past Surgical History:  Procedure Laterality Date  . ANKLE SURGERY    . CARPAL TUNNEL RELEASE    . carpel tunnel    . IR CT HEAD LTD  06/30/2020  . IR INTRA CRAN STENT  06/30/2020  . IR PERCUTANEOUS ART THROMBECTOMY/INFUSION  INTRACRANIAL INC DIAG ANGIO  06/30/2020  . RADIOLOGY WITH ANESTHESIA N/A 06/30/2020   Procedure: RADIOLOGY WITH ANESTHESIA;  Surgeon: Radiologist, Medication, MD;  Location: [place];  Service: Radiology;  Laterality: N/A;    Current Outpatient Medications  Medication Sig Dispense Refill  . acetaminophen (TYLENOL) 325 MG tablet Take 2 tablets (650 mg total) by mouth every 6 (six) hours as needed for mild pain, fever or headache (or temp > 37.5 C (99.5 F)).    Marland Kitchen atorvastatin (LIPITOR) 80 MG tablet Take 1 tablet (80 mg total) by mouth at bedtime. 30 tablet 0  . omeprazole (PRILOSEC) 40 MG capsule Take 40 mg by mouth daily.    . ticagrelor (BRILINTA) 90 MG TABS tablet Take 1 tablet (90 mg total) by mouth 2 (two) times daily. 180 tablet 1  . ursodiol (ACTIGALL) 250 MG tablet Take 250 mg by mouth 2 (two) times daily.    . Vitamin D3 (VITAMIN D) 25 MCG tablet Take 1 tablet (1,000 Units total) by mouth daily before breakfast. 60 tablet 0   No current facility-administered medications for this visit.    Allergies  Allergen Reactions  . Iodinated Diagnostic Agents Itching  . Doxycycline Nausea And Vomiting  . Sulfa Antibiotics Rash and Hives  . Codeine Hives and Swelling    Swollen tongue  . Hydrocodone-Homatropine Itching  . Ace Inhibitors Cough, Itching and Rash    Social History   Socioeconomic History  . Marital status: Divorced    Spouse name: Not on file  . Number of children: Not on file  . Years of education: Not on file  . Highest education level: Not on file  Occupational History  . Not on file  Tobacco Use  . Smoking status: Never Smoker  . Smokeless tobacco: Never Used  Vaping Use  . Vaping Use: Never used  Substance and Sexual Activity  . Alcohol use: No  . Drug use: No  . Sexual activity: Not on file  Other Topics Concern  . Not on file  Social History Narrative  . Not on file   Social Determinants of Health   Financial Resource Strain: Not on file  Food  Insecurity: Not on file  Transportation Needs: Not on file  Physical Activity: Not on file  Stress: Not on file  Social Connections: Not on file  Intimate Partner Violence: Not on file     Review of Systems: General: No chills, fever, night sweats or weight changes  Cardiovascular:  No chest pain, dyspnea on exertion, edema, orthopnea, palpitations, paroxysmal nocturnal dyspnea Dermatological: No rash, lesions or masses Respiratory: No cough, dyspnea Urologic: No hematuria, dysuria Abdominal: No nausea, vomiting, diarrhea, bright red blood per rectum, melena, or hematemesis Neurologic: No visual changes, weakness, changes in mental status All other systems reviewed and are otherwise negative except as noted above.  Physical Exam: Vitals:   08/04/20 1109  BP: 116/74  Pulse: 86  SpO2: 97%  Weight: 202 lb (91.6 kg)  Height: 5\' 2"  (1.575 m)    GEN- The patient is well appearing, alert and oriented x 3 today.   HEENT: normocephalic, atraumatic; sclera clear, conjunctiva pink; hearing intact; oropharynx clear; neck supple, no JVP Lymph- no cervical lymphadenopathy Lungs- Clear to ausculation  bilaterally, normal work of breathing.  No wheezes, rales, rhonchi Heart- Regular rate and rhythm, no murmurs, rubs or gallops, PMI not laterally displaced GI- soft, non-tender, non-distended, bowel sounds present, no hepatosplenomegaly Extremities- no clubbing, cyanosis, or edema; DP/PT/radial pulses 2+ bilaterally MS- no significant deformity or atrophy Skin- warm and dry, no rash or lesion Psych- euthymic mood, full affect Neuro- strength and sensation are intact  EKG is not ordered. Personal review of EKG from 07/01/20 shows NSR at 85 bpm. No prior EKGs to compare  Additional studies reviewed include: Neuro work up and EP note as above.  Assessment and Plan:  1. Cryptogenic stroke (Left MCA infarct) Pt is alert and oriented to person, place, and event today. She still has issues with  word finding. We had long discussion today about the benefits of long term cardiac monitoring with loop recorder for monitoring of atrial fibrillation in setting of cryptogenic stroke.  His has been discussed with Dr. Curt Bears as well.  TEE has not been recommended by Neuro. Appointment has been made for device implantation and prior auth will be submitted.  Follow up with neuro as normal   Shirley Friar, PA-C  08/04/20 11:39 AM

## 2020-08-04 NOTE — Patient Instructions (Signed)
Medication Instructions:  Your physician recommends that you continue on your current medications as directed. Please refer to the Current Medication list given to you today.  *If you need a refill on your cardiac medications before your next appointment, please call your pharmacy*   Lab Work: None Today If you have labs (blood work) drawn today and your tests are completely normal, you will receive your results only by: Marland Kitchen MyChart Message (if you have MyChart) OR . A paper copy in the mail If you have any lab test that is abnormal or we need to change your treatment, we will call you to review the results.   Follow-Up: At [facility], you and your health needs are our priority.  As part of our continuing mission to provide you with exceptional heart care, we have created designated Provider Care Teams.  These Care Teams include your primary Cardiologist (physician) and Advanced Practice Providers (APPs -  Physician Assistants and Nurse Practitioners) who all work together to provide you with the care you need, when you need it.  We recommend signing up for the patient portal called "MyChart".  Sign up information is provided on this After Visit Summary.  MyChart is used to connect with patients for Virtual Visits (Telemedicine).  Patients are able to view lab/test results, encounter notes, upcoming appointments, etc.  Non-urgent messages can be sent to your provider as well.   To learn more about what you can do with MyChart, go to [email or web address].    Your next appointment:   09/08/2020 @ 4:15  The format for your next appointment:   In Person  Provider:   Allegra Lai, MD

## 2020-08-23 ENCOUNTER — Ambulatory Visit (HOSPITAL_COMMUNITY)
Admission: RE | Admit: 2020-08-23 | Discharge: 2020-08-23 | Disposition: A | Discharge status: Home / Self Care | Payer: Medicare Other | Source: Ambulatory Visit | Attending: Student | Admitting: Student

## 2020-08-23 ENCOUNTER — Other Ambulatory Visit: Payer: Self-pay

## 2020-08-23 DIAGNOSIS — I639 Cerebral infarction, unspecified: Secondary | ICD-10-CM

## 2020-08-24 ENCOUNTER — Encounter: Payer: Self-pay | Admitting: Adult Health

## 2020-08-24 ENCOUNTER — Inpatient Hospital Stay: Payer: Self-pay | Admitting: Adult Health

## 2020-08-24 NOTE — Progress Notes (Deleted)
[facility] [address] [place]. [place] [address] [phone]       HOSPITAL FOLLOW UP NOTE  [name] Date of Birth:  24-Oct-1955 Medical Record Number:  [id number]   Reason for Referral:  hospital stroke follow up    SUBJECTIVE:   CHIEF COMPLAINT:  No chief complaint on file.   HPI:   [name] is a 65 y.o. female with history of hypertension, diabetes, CKD, and obesity status post gastric bypass in 05/2020 admitted for confusion, seizure-like activity, drooling, bowel incontinence, and left gaze and right-sided weakness on 06/30/2020.  Personally reviewed hospitalization pertinent progress notes, lab work and imaging with summary provided.  Stroke work-up revealed left MCA infarct due to left MCA occlusion s/p IR with MCA stenting with TICI 2C reperfusion, embolic pattern secondary to unclear source. Acute infarcts basal ganglia, left temporal, left frontal, parietal lobes, occipital white matter bilaterally, right cerebellum and small area of hemorrhage left posterior temporal lobe.  Recommended ILR long-term monitoring of possible atrial fibrillation as etiology.  On aspirin PTA and recommended continuation in addition to Brilinta.  Seizure-like activity with EEG showing intermittent rhythmic delta activity in left frontal region and initiated Keppra 500 mg twice daily for seizure prophylaxis.  A1c 5.4.  LDL 68.  Increase atorvastatin from 40 mg PTA to 80 mg daily.  Other stroke risk factors include advanced age and obesity but no prior stroke history.  Other active problems include CKD, hypernatremia, anemia with small retroperitoneal hematoma.  Residual deficits of dysphagia, severe global aphasia, right homonymous hemianopia, right > left hemiparesis and right-sided sensory impairment.  Evaluated by therapies and recommended discharge to CIR for ongoing therapy needs.  Stroke:  left MCA infarct due to left MCA occlusion status post IR and  MCA stenting with [id number], embolic pattern secondary to unclear source  Resultant global aphasia with right hemiparesis  CTA head left MCA territory infarct.  Aspects 6  CT head and neck left MCA origin occlusion with some left MCA branch reconstitution.  Severe stenosis right ACA origin, moderate stenosis right M2 origin, mild stenosis right M1 and VB junction  MRI / MRA - Acute infarct left MCA territory involving the basal ganglia as well as the left temporal, frontal, and parietal lobes. Small area of hemorrhage in the left posterior temporal lobe. Additional small areas of acute infarct in the occipital white matter bilaterally and right cerebellum. Left MCA stent appears patent. Moderate intracranial atherosclerotic disease.  CT Abdomen and Pelvis - small retroperitoneal hematoma.  2D Echo EF 60 to 65%  LE venous Doppler Negative for DVT  Recommend loop recorder to rule out A. fib on discharge  LDL 68  HgbA1c 5.4  Lovenox for VTE prophylaxis  aspirin 81 mg daily prior to admission, now on aspirin 81 mg daily and Brilinta (ticagrelor) 90 mg bid.   Ongoing aggressive stroke risk factor management  Therapy recommendations: CIR - Rehab MD accepted patient for possible inpt rehab admission.  Disposition: d/c to CIR on 07/09/2020  Today, 08/24/2020, Courtney Burke is being seen for hospital follow-up.  Discharged from home from [facility] on 07/26/2020  Reports residual ***   Compliant on aspirin and Brilinta -denies associated side effects Compliant on atorvastatin 80 mg daily -denies side effects Blood pressure today*** Glucose levels ***  Repeat EEG during CIR negative therefore Keppra discontinued during CIR stay  F/u with cardiology 08/04/2020 with OV note personally reviewed with plans on scheduling appointment for ILR placement  ROS:   14 system review of systems performed and negative with exception of ***  PMH:  Past Medical History:  Diagnosis Date  . Acute ischemic  left MCA stroke ([place]) 06/30/2020  . Acute ischemic stroke ([place])   . Acute stroke due to occlusion of left middle cerebral artery ([place]) 06/30/2020  . Age-related nuclear cataract of both eyes 09/28/2015  . Arthralgia of multiple joints 09/28/2015  . Asthma   . Borderline diabetes   . Cerebrovascular accident (CVA) ([place])   . Chronic bilateral thoracic back pain 06/05/2018  . Chronic renal insufficiency, stage 1 08/11/2014  . Diabetes mellitus type 2 in obese ([place])   . Dyslipidemia   . Dysphagia, post-stroke   . Erosive gastritis 07/20/2019   Formatting of this note might be different from the original. On EGD 07/2019  . Esophagitis 07/20/2019   Formatting of this note might be different from the original. Added automatically from request for surgery [id number]  Formatting of this note might be different from the original. On EGD 07/2019  . Essential hypertension 09/28/2015  . Essential thrombocytosis ([place]) 09/28/2015  . Gallstones 08/18/2019   Formatting of this note might be different from the original. Added automatically from request for surgery [id number]  . Gastroesophageal reflux disease without esophagitis 09/28/2015  . 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 Hyperlipidemia, unspecified 03/16/2013  . Hypertension   . Keratoconjunctivitis sicca of both eyes not specified as Sjogren's 09/28/2015  . Left middle cerebral artery stroke ([place]) 07/09/2020  . Morbid obesity ([place]) 05/21/2013  . Stage 3b chronic kidney disease ([place])   . Status post gastric bypass for obesity 09/28/2015    PSH:  Past Surgical History:  Procedure Laterality Date  . ANKLE SURGERY    . CARPAL TUNNEL RELEASE    . carpel tunnel    . IR CT HEAD LTD  06/30/2020  . IR INTRA CRAN STENT  06/30/2020  . IR PERCUTANEOUS ART THROMBECTOMY/INFUSION INTRACRANIAL INC DIAG ANGIO  06/30/2020  . RADIOLOGY WITH ANESTHESIA N/A 06/30/2020   Procedure: RADIOLOGY WITH ANESTHESIA;  Surgeon: Radiologist, Medication, MD;  Location: [place];  Service:  Radiology;  Laterality: N/A;    Social History:  Social History   Socioeconomic History  . Marital status: Divorced    Spouse name: Not on file  . Number of children: Not on file  . Years of education: Not on file  . Highest education level: Not on file  Occupational History  . Not on file  Tobacco Use  . Smoking status: Never Smoker  . Smokeless tobacco: Never Used  Vaping Use  . Vaping Use: Never used  Substance and Sexual Activity  . Alcohol use: No  . Drug use: No  . Sexual activity: Not on file  Other Topics Concern  . Not on file  Social History Narrative  . Not on file   Social Determinants of Health   Financial Resource Strain: Not on file  Food Insecurity: Not on file  Transportation Needs: Not on file  Physical Activity: Not on file  Stress: Not on file  Social Connections: Not on file  Intimate Partner Violence: Not on file    Family History: No family history on file.  Medications:   Current Outpatient Medications on File Prior to Visit  Medication Sig Dispense Refill  . acetaminophen (TYLENOL) 325 MG tablet Take 2 tablets (650 mg total) by mouth every 6 (six) hours as needed for mild pain, fever or headache (or temp > 37.5 C (  99.5 F)).    Marland Kitchen atorvastatin (LIPITOR) 80 MG tablet Take 1 tablet (80 mg total) by mouth at bedtime. 30 tablet 0  . omeprazole (PRILOSEC) 40 MG capsule Take 40 mg by mouth daily.    . ticagrelor (BRILINTA) 90 MG TABS tablet Take 1 tablet (90 mg total) by mouth 2 (two) times daily. 180 tablet 1  . ursodiol (ACTIGALL) 250 MG tablet Take 250 mg by mouth 2 (two) times daily.    . Vitamin D3 (VITAMIN D) 25 MCG tablet Take 1 tablet (1,000 Units total) by mouth daily before breakfast. 60 tablet 0   No current facility-administered medications on file prior to visit.    Allergies:   Allergies  Allergen Reactions  . Iodinated Diagnostic Agents Itching  . Doxycycline Nausea And Vomiting  . Sulfa Antibiotics Rash and Hives  . Codeine  Hives and Swelling    Swollen tongue  . Hydrocodone-Homatropine Itching  . Ace Inhibitors Cough, Itching and Rash      OBJECTIVE:  Physical Exam  There were no vitals filed for this visit. There is no height or weight on file to calculate BMI. No exam data present  Depression screen [facility] 2/9 08/04/2020  Decreased Interest 0  Down, Depressed, Hopeless 1  PHQ - 2 Score 1  Altered sleeping 0  Tired, decreased energy 0  Change in appetite 0  Feeling bad or failure about yourself  1  Trouble concentrating 0  Moving slowly or fidgety/restless 1  Suicidal thoughts 0  PHQ-9 Score 3     General: well developed, well nourished, seated, in no evident distress Head: head normocephalic and atraumatic.   Neck: supple with no carotid or supraclavicular bruits Cardiovascular: regular rate and rhythm, no murmurs Musculoskeletal: no deformity Skin:  no rash/petichiae Vascular:  Normal pulses all extremities   Neurologic Exam Mental Status: Awake and fully alert. Oriented to place and time. Recent and remote memory intact. Attention span, concentration and fund of knowledge appropriate. Mood and affect appropriate.  Cranial Nerves: Fundoscopic exam reveals sharp disc margins. Pupils equal, briskly reactive to light. Extraocular movements full without nystagmus. Visual fields full to confrontation. Hearing intact. Facial sensation intact. Face, tongue, palate moves normally and symmetrically.  Motor: Normal bulk and tone. Normal strength in all tested extremity muscles Sensory.: intact to touch , pinprick , position and vibratory sensation.  Coordination: Rapid alternating movements normal in all extremities. Finger-to-nose and heel-to-shin performed accurately bilaterally. Gait and Station: Arises from chair without difficulty. Stance is normal. Gait demonstrates normal stride length and balance with ***. Tandem walk and heel toe ***.  Reflexes: 1+ and symmetric. Toes downgoing.     NIHSS   *** Modified Rankin  ***      ASSESSMENT: Courtney Burke is a 65 y.o. year old female presented with confusion, seizure-like activity, drooling, bowel incontinence, left gaze deviation and right-sided weakness on 06/30/2020 with stroke work-up revealing left MCA infarcts due to left MCA occlusion s/p IR with MCA stenting and TICI 2C reperfusion as well as bilateral occipital and right cerebellar infarcts, embolic pattern secondary to unclear source. Vascular risk factors include HTN, HLD, DM, advanced age and obesity.      PLAN:  1. Cryptogenic stroke: Residual deficit: ***.  Awaiting ILR placement.  Continue aspirin 81 mg daily and Brilinta (ticagrelor) 90 mg bid  and atorvastatin 80 mg daily for secondary stroke prevention.  Discussed secondary stroke prevention measures and importance of close PCP follow up for aggressive stroke risk factor  management  2. Seizures, with stroke onset: Initial EEG Intermittent rhythmic delta activity in left frontal region is on the ictal-interictal continuum with low to intermediate potential for seizures.  Repeat EEG negative -Keppra discontinued 3.  4. HTN: BP goal <130/90.  Stable on *** per PCP 5. HLD: LDL goal <70. Recent LDL 68 -currently on atorvastatin 80 mg daily.  6. DMII: A1c goal<7.0. Recent A1c 5.4.  7.     Follow up in *** or call earlier if needed   CC:  [facility] provider: Dr. Leonie Man PCP: [name], PA-C    I spent *** minutes of face-to-face and non-face-to-face time with patient.  This included previsit chart review, lab review, study review, order entry, electronic health record documentation, patient education regarding recent stroke, residual deficits, importance of managing stroke risk factors and answered all questions to patient satisfaction     Frann Rider, [facility]  [facility] [address] [place], [place] [address]  Phone [phone] Fax [phone] Note: This  document was prepared with digital dictation and possible smart phrase technology. Any transcriptional errors that result from this process are unintentional.

## 2020-08-26 HISTORY — PX: IR RADIOLOGIST EVAL & MGMT: IMG5224

## 2020-09-02 ENCOUNTER — Encounter: Payer: Medicare Other | Attending: Physical Medicine & Rehabilitation | Admitting: Physical Medicine & Rehabilitation

## 2020-09-02 ENCOUNTER — Encounter: Payer: Self-pay | Admitting: Physical Medicine & Rehabilitation

## 2020-09-02 ENCOUNTER — Other Ambulatory Visit: Payer: Self-pay

## 2020-09-02 VITALS — BP 118/72 | HR 70 | Temp 98.2°F | Ht 62.0 in | Wt 192.0 lb

## 2020-09-02 DIAGNOSIS — I63512 Cerebral infarction due to unspecified occlusion or stenosis of left middle cerebral artery: Secondary | ICD-10-CM | POA: Insufficient documentation

## 2020-09-02 NOTE — Progress Notes (Signed)
Subjective:    Patient ID: Courtney Burke, female    DOB: 1956-06-02, 65 y.o.   MRN: [id number] 65 y.o. right-handed female with history of hypertension, diet-controlled diabetes mellitus, CKD stage III, morbid obesity gastric sleeve 05/24/2020.  Presented 06/30/2020 with altered mental status and aphasia.  Cranial CT scan showed acute left MCA territory infarction.  No hemorrhage or mass-effect.  Patient did not receive TPA.  EEG negative for seizures.  CT angiogram of head and neck positive for emergent large vessel occlusion.  Left MCA origin.  Some left MCA branch reconstitution underwent MCA stenting per interventional radiology.  Follow-up MRI showed acute infarct left MCA territory involving the basal ganglia as well as left temporal frontal parietal lobes.  Small amount of hemorrhage in the left posterior temporal lobe.  Additional small areas of acute infarction in the occipital white matter bilaterally and right cerebellum.  Echocardiogram with ejection fraction of 60 to 65%.  No wall motion abnormalities grade 1 diastolic dysfunction.  Maintained on aspirin 81 mg daily as well as Brilinta for CVA prophylaxis.  Patient follow-up outpatient cardiology service to discuss loop recorder placement.  Subcutaneous Lovenox for DVT prophylaxis.  Maintained on Keppra for seizure prophylaxis.  Hospital course complicated by post stroke dysphagia currently on a dysphagia #2 nectar thick liquid diet with alternative means of nutritional support.  Bouts of urinary retention placed on Urecholine.  Due to patient decreased functional ability and global aphasia was admitted for a comprehensive rehab program  Admit date: 07/09/2020 Discharge date: 07/26/2020  HPI Remains aphasia No falls  Occ HA Mod I dressing and bathing Home health PT, OT, SLP Pain Inventory Average Pain 6 Pain Right Now 0 My pain is intermittent and sharp  LOCATION OF PAIN  Headache  BOWEL Number of stools per week: 7 Oral  laxative use No  Type of laxative none Enema or suppository use No  History of colostomy No  Incontinent No   BLADDER Normal In and out cath, frequency N/A Able to self cath No  Bladder incontinence No  Frequent urination No  Leakage with coughing No  Difficulty starting stream No  Incomplete bladder emptying No    Mobility use a walker how many minutes can you walk? 10 ability to climb steps?  yes do you drive?  no Do you have any goals in this area?  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  yes  Neuro/Psych trouble walking  Prior Studies Any changes since last visit?  no  Physicians involved in your care Any changes since last visit?  no   History reviewed. No pertinent family history. Social History   Socioeconomic History  . Marital status: Divorced    Spouse name: Not on file  . Number of children: Not on file  . Years of education: Not on file  . Highest education level: Not on file  Occupational History  . Not on file  Tobacco Use  . Smoking status: Never Smoker  . Smokeless tobacco: Never Used  Vaping Use  . Vaping Use: Never used  Substance and Sexual Activity  . Alcohol use: No  . Drug use: No  . Sexual activity: Not on file  Other Topics Concern  . Not on file  Social History Narrative  . Not on file   Social Determinants of  Health   Financial Resource Strain: Not on file  Food Insecurity: Not on file  Transportation Needs: Not on file  Physical Activity: Not on file  Stress: Not on file  Social Connections: Not on file   Past Surgical History:  Procedure Laterality Date  . ANKLE SURGERY    . CARPAL TUNNEL RELEASE    . carpel tunnel    . IR CT HEAD LTD  06/30/2020  . IR INTRA CRAN STENT  06/30/2020  . IR PERCUTANEOUS ART THROMBECTOMY/INFUSION INTRACRANIAL INC DIAG ANGIO   06/30/2020  . IR RADIOLOGIST EVAL & MGMT  08/26/2020  . RADIOLOGY WITH ANESTHESIA N/A 06/30/2020   Procedure: RADIOLOGY WITH ANESTHESIA;  Surgeon: Radiologist, Medication, MD;  Location: [facility];  Service: Radiology;  Laterality: N/A;   Past Medical History:  Diagnosis Date  . Acute ischemic left MCA stroke ([place]) 06/30/2020  . Acute ischemic stroke ([place])   . Acute stroke due to occlusion of left middle cerebral artery ([place]) 06/30/2020  . Age-related nuclear cataract of both eyes 09/28/2015  . Arthralgia of multiple joints 09/28/2015  . Asthma   . Borderline diabetes   . Cerebrovascular accident (CVA) ([place])   . Chronic bilateral thoracic back pain 06/05/2018  . Chronic renal insufficiency, stage 1 08/11/2014  . Diabetes mellitus type 2 in obese ([place])   . Dyslipidemia   . Dysphagia, post-stroke   . Erosive gastritis 07/20/2019   Formatting of this note might be different from the original. On EGD 07/2019  . Esophagitis 07/20/2019   Formatting of this note might be different from the original. Added automatically from request for surgery [id number]  Formatting of this note might be different from the original. On EGD 07/2019  . Essential hypertension 09/28/2015  . Essential thrombocytosis ([place]) 09/28/2015  . Gallstones 08/18/2019   Formatting of this note might be different from the original. Added automatically from request for surgery [id number]  . Gastroesophageal reflux disease without esophagitis 09/28/2015  . 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 Hyperlipidemia, unspecified 03/16/2013  . Hypertension   . Keratoconjunctivitis sicca of both eyes not specified as Sjogren's 09/28/2015  . Left middle cerebral artery stroke ([place]) 07/09/2020  . Morbid obesity ([place]) 05/21/2013  . Stage 3b chronic kidney disease ([place])   . Status post gastric bypass for obesity 09/28/2015   BP (!) 148/80   Pulse 70   Temp 98.2 F (36.8 C)   Ht 5\' 2"  (1.575 m)   Wt 192 lb (87.1 kg)   LMP 09/28/2012   SpO2 99%   BMI 35.12  kg/m   Opioid Risk Score:   Fall Risk Score:  `1  Depression screen PHQ 2/9  Depression screen [facility] 2/9 09/02/2020 08/04/2020  Decreased Interest 0 0  Down, Depressed, Hopeless 0 1  PHQ - 2 Score 0 1  Altered sleeping - 0  Tired, decreased energy - 0  Change in appetite - 0  Feeling bad or failure about yourself  - 1  Trouble concentrating - 0  Moving slowly or fidgety/restless - 1  Suicidal thoughts - 0  PHQ-9 Score - 3   Review of Systems  Musculoskeletal: Positive for gait problem.  Neurological: Positive for headaches.       Objective:   Physical Exam Vitals and nursing note reviewed.  Constitutional:      Appearance: She is obese.  HENT:     Head: Normocephalic.  Eyes:     Extraocular Movements: Extraocular movements intact.     Conjunctiva/sclera: Conjunctivae  normal.     Pupils: Pupils are equal, round, and reactive to light.  Cardiovascular:     Rate and Rhythm: Normal rate and regular rhythm.     Heart sounds: No murmur heard.   Pulmonary:     Effort: Pulmonary effort is normal.     Breath sounds: Normal breath sounds. No stridor. No wheezing.  Abdominal:     General: Abdomen is flat. Bowel sounds are normal. There is no distension.     Palpations: Abdomen is soft.  Musculoskeletal:     Cervical back: Neck supple.     Comments: No pain with upper extremity or lower extremity range of motion No swelling noted in the hands or wrists bilaterally no knee effusion noted  Skin:    General: Skin is warm and dry.  Neurological:     Mental Status: She is alert.     Gait: Gait abnormal.     Comments: Motor strength is 5/5 bilateral deltoid bicep tricep grip hip flexor knee extensor ankle dorsiflexor plantar flexor Ambulates with walker no evidence of toe drag or knee instability Speech fluent with word substitutions, has difficulty with simple naming was having difficulty with eyeglasses.  She was able to stay pen.  She is able to repeat simple words.  Receptive  language is good for simple information  Psychiatric:        Mood and Affect: Mood normal.           Assessment & Plan:  #1.  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  Continue home health PT OT speech Follow-up physical medicine rehab 1 month Have a asked patient and daughter to follow-up with primary MD as well We discussed that the patient is not competent to manage her own financial affairs I have written a letter stating that her stroke has caused this problem and that the daughter is  taking care of the patient

## 2020-09-08 ENCOUNTER — Ambulatory Visit (INDEPENDENT_AMBULATORY_CARE_PROVIDER_SITE_OTHER): Payer: Medicare Other | Admitting: Cardiology

## 2020-09-08 ENCOUNTER — Encounter: Payer: Self-pay | Admitting: Cardiology

## 2020-09-08 ENCOUNTER — Other Ambulatory Visit: Payer: Self-pay

## 2020-09-08 VITALS — BP 130/78 | HR 80 | Ht 62.0 in | Wt 193.2 lb

## 2020-09-08 DIAGNOSIS — I639 Cerebral infarction, unspecified: Secondary | ICD-10-CM

## 2020-09-08 NOTE — Patient Instructions (Addendum)
Medication Instructions:  Your physician recommends that you continue on your current medications as directed. Please refer to the Current Medication list given to you today.  Labwork: None ordered.  Testing/Procedures: None ordered.  Your physician wants you to follow-up in: one year with: as needed with Dr. Curt Bears. We will follow you device remotely.   Implantable Loop Recorder Placement, Care After This sheet gives you information about how to care for yourself after your procedure. Your health care provider may also give you more specific instructions. If you have problems or questions, contact your health care provider. What can I expect after the procedure? After the procedure, it is common to have:  Soreness or discomfort near the incision.  Some swelling or bruising near the incision.  Follow these instructions at home: Incision care  1.  Leave your outer dressing on for 24 hours.  After 72 hours you can remove your outer dressing and shower. 2. Leave adhesive strips in place. These skin closures may need to stay in place for 1-2 weeks. If adhesive strip edges start to loosen and curl up, you may trim the loose edges.  You may remove the strips if they have not fallen off after 2 weeks. 3. Check your incision area every day for signs of infection. Check for: a. Redness, swelling, or pain. b. Fluid or blood. c. Warmth. d. Pus or a bad smell. 4. Do not take baths, swim, or use a hot tub until your incision is completely healed. 5. If your wound site starts to bleed apply pressure.      If you have any questions/concerns please call the device clinic at [phone].  Activity  Return to your normal activities.  General instructions  Follow instructions from your health care provider about how to manage your implantable loop recorder and transmit the information. Learn how to activate a recording if this is necessary for your type of device.  Do not go through a  metal detection gate, and do not let someone hold a metal detector over your chest. Show your ID card.  Do not have an MRI unless you check with your health care provider first.  Take over-the-counter and prescription medicines only as told by your health care provider.  Keep all follow-up visits as told by your health care provider. This is important. Contact a health care provider if:  You have redness, swelling, or pain around your incision.  You have a fever.  You have pain that is not relieved by your pain medicine.  You have triggered your device because of fainting (syncope) or because of a heartbeat that feels like it is racing, slow, fluttering, or skipping (palpitations). Get help right away if you have:  Chest pain.  Difficulty breathing. Summary  After the procedure, it is common to have soreness or discomfort near the incision.  Change your dressing as told by your health care provider.  Follow instructions from your health care provider about how to manage your implantable loop recorder and transmit the information.  Keep all follow-up visits as told by your health care provider. This is important. This information is not intended to replace advice given to you by your health care provider. Make sure you discuss any questions you have with your health care provider. Document Released: 05/02/2015 Document Revised: 07/06/2017 Document Reviewed: 07/06/2017 Elsevier Patient Education  2020 [employer].

## 2020-09-08 NOTE — Progress Notes (Signed)
Electrophysiology Office Note   Date:  09/09/2020   ID:  Courtney, Burke 12/13/1955, MRN [id number]  PCP:  [name], PA-C  Cardiologist:  none Primary Electrophysiologist:  Nasim Garofano Meredith Leeds, MD    Chief Complaint: CVA   History of Present Illness: Courtney Burke is a 65 y.o. female who is being seen today for the evaluation of CVA at the request of Roderic Ovens Christin*. Presenting today for electrophysiology evaluation.  She was admitted February 2022 with confusion, seizure-like activity, drooling, left gaze, and right-sided weakness.  She was found to have a MCA occlusion and is status post MCA stenting.  This was an embolic pattern with an unclear source.  She had a full work-up without cause for her stroke.  She presents today for possible Linq monitor implant.  Today, she denies symptoms of palpitations, chest pain, shortness of breath, orthopnea, PND, lower extremity edema, claudication, dizziness, presyncope, syncope, bleeding, or neurologic sequela. The patient is tolerating medications without difficulties.  She continues to have lower extremity weakness and slurred speech.  Aside from that, she has improved since last being seen.   Past Medical History:  Diagnosis Date  . Acute ischemic left MCA stroke ([place]) 06/30/2020  . Acute ischemic stroke ([place])   . Acute stroke due to occlusion of left middle cerebral artery ([place]) 06/30/2020  . Age-related nuclear cataract of both eyes 09/28/2015  . Arthralgia of multiple joints 09/28/2015  . Asthma   . Borderline diabetes   . Cerebrovascular accident (CVA) ([place])   . Chronic bilateral thoracic back pain 06/05/2018  . Chronic renal insufficiency, stage 1 08/11/2014  . Diabetes mellitus type 2 in obese ([place])   . Dyslipidemia   . Dysphagia, post-stroke   . Erosive gastritis 07/20/2019   Formatting of this note might be different from the original. On EGD 07/2019  . Esophagitis 07/20/2019   Formatting of  this note might be different from the original. Added automatically from request for surgery [id number]  Formatting of this note might be different from the original. On EGD 07/2019  . Essential hypertension 09/28/2015  . Essential thrombocytosis ([place]) 09/28/2015  . Gallstones 08/18/2019   Formatting of this note might be different from the original. Added automatically from request for surgery [id number]  . Gastroesophageal reflux disease without esophagitis 09/28/2015  . 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 Hyperlipidemia, unspecified 03/16/2013  . Hypertension   . Keratoconjunctivitis sicca of both eyes not specified as Sjogren's 09/28/2015  . Left middle cerebral artery stroke ([place]) 07/09/2020  . Morbid obesity ([place]) 05/21/2013  . Stage 3b chronic kidney disease ([place])   . Status post gastric bypass for obesity 09/28/2015   Past Surgical History:  Procedure Laterality Date  . ANKLE SURGERY    . CARPAL TUNNEL RELEASE    . carpel tunnel    . IR CT HEAD LTD  06/30/2020  . IR INTRA CRAN STENT  06/30/2020  . IR PERCUTANEOUS ART THROMBECTOMY/INFUSION INTRACRANIAL INC DIAG ANGIO  06/30/2020  . IR RADIOLOGIST EVAL & MGMT  08/26/2020  . RADIOLOGY WITH ANESTHESIA N/A 06/30/2020   Procedure: RADIOLOGY WITH ANESTHESIA;  Surgeon: Radiologist, Medication, MD;  Location: [facility];  Service: Radiology;  Laterality: N/A;     Current Outpatient Medications  Medication Sig Dispense Refill  . acetaminophen (TYLENOL) 325 MG tablet Take 2 tablets (650 mg total) by mouth every 6 (six) hours as needed for mild pain, fever or headache (or temp > 37.5 C (99.5 F)).    Marland Kitchen  atorvastatin (LIPITOR) 80 MG tablet Take 1 tablet (80 mg total) by mouth at bedtime. 30 tablet 0  . omeprazole (PRILOSEC) 40 MG capsule Take 40 mg by mouth daily.    . ticagrelor (BRILINTA) 90 MG TABS tablet Take 1 tablet (90 mg total) by mouth 2 (two) times daily. 180 tablet 1  . ursodiol (ACTIGALL) 250 MG tablet Take 250 mg by mouth 2 (two) times daily.     . Vitamin D3 (VITAMIN D) 25 MCG tablet TAKE 1 TABLET (1,000 UNITS TOTAL) BY MOUTH DAILY BEFORE BREAKFAST. 60 tablet 0   No current facility-administered medications for this visit.    Allergies:   Iodinated diagnostic agents, Doxycycline, Sulfa antibiotics, Codeine, Hydrocodone-homatropine, and Ace inhibitors   Social History:  The patient  reports that she has never smoked. She has never used smokeless tobacco. She reports that she does not drink alcohol and does not use drugs.   Family History:  The patient's family history includes Stroke in her maternal grandfather.    ROS:  Please see the history of present illness.   Otherwise, review of systems is positive for none.   All other systems are reviewed and negative.    PHYSICAL EXAM: VS:  BP 130/78   Pulse 80   Ht 5\' 2"  (1.575 m)   Wt 193 lb 3.2 oz (87.6 kg)   LMP 09/28/2012   SpO2 97%   BMI 35.34 kg/m  , BMI Body mass index is 35.34 kg/m. GEN: Well nourished, well developed, in no acute distress  HEENT: normal  Neck: no JVD, carotid bruits, or masses Cardiac: RRR; no murmurs, rubs, or gallops,no edema  Respiratory:  clear to auscultation bilaterally, normal work of breathing GI: soft, nontender, nondistended, + BS MS: no deformity or atrophy  Skin: warm and dry Neuro:  Strength and sensation are intact Psych: euthymic mood, full affect  EKG:  EKG is not ordered today. Personal review of the ekg ordered shows sinus rhythm, rate 85, LVH  Recent Labs: 07/07/2020: Magnesium 2.1 07/11/2020: ALT 15; Hemoglobin 10.9; Platelets 869 07/17/2020: BUN 28; Creatinine, Ser 1.45; Potassium 4.5; Sodium 136    Lipid Panel     Component Value Date/Time   CHOL 143 07/01/2020 0513   TRIG 172 (H) 07/01/2020 0513   TRIG 164 (H) 07/01/2020 0513   HDL 41 07/01/2020 0513   CHOLHDL 3.5 07/01/2020 0513   VLDL 34 07/01/2020 0513   LDLCALC 68 07/01/2020 0513     Wt Readings from Last 3 Encounters:  09/08/20 193 lb 3.2 oz (87.6 kg)   09/02/20 192 lb (87.1 kg)  08/04/20 202 lb (91.6 kg)      Other studies Reviewed: Additional studies/ records that were reviewed today include: TTE 07/01/20  Review of the above records today demonstrates:  1. Left ventricular ejection fraction, by estimation, is 60 to 65%. The  left ventricle has normal function. The left ventricle has no regional  wall motion abnormalities. There is moderate left ventricular hypertrophy.  Left ventricular diastolic  parameters are consistent with Grade I diastolic dysfunction (impaired  relaxation).  2. Right ventricular systolic function is normal. The right ventricular  size is normal. There is normal pulmonary artery systolic pressure. The  estimated right ventricular systolic pressure is 18.2 mmHg.  3. The mitral valve is grossly normal. Trivial mitral valve  regurgitation.  4. The aortic valve is tricuspid. Aortic valve regurgitation is not  visualized.  5. The inferior vena cava is normal in size with greater than  50%  respiratory variability, suggesting right atrial pressure of 3 mmHg.   ASSESSMENT AND PLAN:  1.  Cryptogenic stroke: Had a left MCA pattern stroke post IR MCA stenting.  Was an embolic pattern with an unclear source.  At this point, no cause for the stroke has been discovered.  She would benefit from Linq monitoring for atrial fibrillation.  Risk and benefits of implantation were discussed include bleeding and infection.  The patient understands the risks and is agreed to the procedure.  2.  Hypertension:well controlled  3.  Obesity: Status post gastric bypass December 2021    Current medicines are reviewed at length with the patient today.   The patient does not have concerns regarding her medicines.  The following changes were made today:  none  Labs/ tests ordered today include:  No orders of the defined types were placed in this encounter.    Disposition:   FU with Fielding Mault as needed LINQ  results  Signed, Azariyah Luhrs Meredith Leeds, MD  09/09/2020 7:24 AM     [facility] [address] [place] [address] [phone] (office) [phone] (fax)  SURGEON:  Allegra Lai, MD     PREPROCEDURE DIAGNOSIS:  Cryptogenic Stroke    POSTPROCEDURE DIAGNOSIS:  Cryptogenic Stroke     PROCEDURES:   1. Implantable loop recorder implantation    INTRODUCTION:  Gabreille Dardis is a 65 y.o. female with a history of unexplained stroke who presents today for implantable loop implantation.  The patient has had a cryptogenic stroke.  Despite an extensive workup by neurology, no reversible causes have been identified.  she has worn telemetry during which she did not have arrhythmias.  There is significant concern for possible atrial fibrillation as the cause for the patients stroke.  The patient therefore presents today for implantable loop implantation.     DESCRIPTION OF PROCEDURE:  Informed written consent was obtained, and the patient was brought to the electrophysiology lab in a fasting state.  The patient required no sedation for the procedure today.  Mapping over the patient's chest was performed by the EP lab staff to identify the area where electrograms were most prominent for ILR recording.  This area was found to be the left parasternal region over the 3rd-4th intercostal space. The patients left chest was therefore prepped and draped in the usual sterile fashion by the EP lab staff. The skin overlying the left parasternal region was infiltrated with lidocaine for local analgesia.  A 0.5-cm incision was made over the left parasternal region over the 3rd intercostal space.  A subcutaneous ILR pocket was fashioned using a combination of sharp and blunt dissection.  A Medtronic Reveal Linq model [place] [id number] implantable loop recorder was then placed into the pocket  R waves were very prominent and measured 0.32mV. EBL<1 ml.  Steri- Strips and a sterile dressing  were then applied.  There were no early apparent complications.     CONCLUSIONS:   1. Successful implantation of a Medtronic Reveal LINQ implantable loop recorder for cryptogenic stroke  2. No early apparent complications.

## 2020-09-30 ENCOUNTER — Other Ambulatory Visit: Payer: Self-pay

## 2020-09-30 ENCOUNTER — Ambulatory Visit: Payer: Medicare Other | Admitting: Physical Medicine & Rehabilitation

## 2020-09-30 MED ORDER — TICAGRELOR 90 MG PO TABS: 90.0000 mg | ORAL_TABLET | Freq: Two times a day (BID) | ORAL | 3 refills | Status: DC

## 2020-09-30 NOTE — Telephone Encounter (Signed)
Pt's medication was sent to pt's pharmacy as requested. Confirmation received.  °

## 2020-10-05 ENCOUNTER — Telehealth: Payer: Self-pay

## 2020-10-05 ENCOUNTER — Ambulatory Visit (INDEPENDENT_AMBULATORY_CARE_PROVIDER_SITE_OTHER): Payer: Medicare Other | Admitting: Neurology

## 2020-10-05 ENCOUNTER — Encounter: Payer: Self-pay | Admitting: Neurology

## 2020-10-05 VITALS — BP 153/75 | Ht 62.0 in | Wt 182.6 lb

## 2020-10-05 DIAGNOSIS — I639 Cerebral infarction, unspecified: Secondary | ICD-10-CM

## 2020-10-05 DIAGNOSIS — I63512 Cerebral infarction due to unspecified occlusion or stenosis of left middle cerebral artery: Secondary | ICD-10-CM

## 2020-10-05 DIAGNOSIS — R4701 Aphasia: Secondary | ICD-10-CM

## 2020-10-05 DIAGNOSIS — R269 Unspecified abnormalities of gait and mobility: Secondary | ICD-10-CM

## 2020-10-05 NOTE — Telephone Encounter (Signed)
Spoke to patients daughter Alma Downs, on [place]), advised Dr. Curt Bears would like for patient to been seen in the AF clinic to start [facility] for new onset of AF. Agreeable to plan. Advised someone will call to set up apt. Verbalized understanding and agreeable to plan.

## 2020-10-05 NOTE — Telephone Encounter (Signed)
Follow up in AF clinic for initiation of [place].

## 2020-10-05 NOTE — Progress Notes (Signed)
[facility] [address] [place]. [place] [address] [phone]       OFFICE FOLLOW-UP NOTE  [name] Date of Birth:  [birth year] Medical Record Number:  [id number]   HPI: Courtney Burke is a 65 year old African-American lady seen today for initial office follow-up visit following hospital admission for stroke in January 2022.  She is accompanied by her daughter.  History is obtained from them and review of hospital electronic medical records and I personally reviewed imaging films in PACS.  She is a 65 year old pleasant African-American lady with past medical history of diabetes, hypertension, chronic kidney disease stage IIIa, dysphagia, obesity, s/p gastric sleeve surgery in December 2021.  She was brought in as a code stroke by EMS after she developed altered mental status.  She had a witnessed episodes in which her eyes rolled back in her head twice with brief loss of consciousness.  She is subsequently found to have right-sided weakness.  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  Aspect score was 6 on admission.  Infarct core was 11 to 20 cc only.  After discussion of risk benefits with the patient's family patient was taken for emergent mechanical thrombectomy which was performed by Dr. Estanislado Pandy but there was underlying MCA stenosis requiring rescue MCA stenting with resultant TICI 2C revascularization.  She was admitted admitted to the intensive care unit blood pressure tightly controlled.  She was subsequently extubated and found to have global aphasia and right hemiparesis.  MRI scan showed acute infarct involving left MCA territory of basal ganglia as well as left patchy temporal frontal and parietal lobes.   There is small area of hemorrhage noted in the left posterior temporal lobe.  2D echo showed ejection fraction of 6666%.  Lower extremity venous Dopplers negative for DVT.  LDL cholesterol 68 mg percent.  Hemoglobin A1c was 5.4.  Patient was started on aspirin and Brilinta for MCA stent.  History of dysphagia and had a feeding tube inserted.  She was transferred to inpatient rehab where she stayed for few weeks.  She also started on Keppra for seizure prophylaxis due to her episodes of seizure-like presentation.  EEG she had shown intermittent rhythmic delta slowing in the left frontal region.  Patient has been at home since rehab.  She is to get getting physical occupational and speech therapy which is now down to 1 day a week.  She is able to speak better but still has to speak slowly and occasionally gets stuck and then some word finding difficulties.  She has noticed improvement in right-sided strength and now she can walk independently but needs a walker.  She still has some dragging of the right foot.  Her balance is poor.  However she has had no falls or injuries.  She is able to manage most activities of daily living but needs a little supervision.  She is tolerating aspirin and Brilinta well with only minor bruising and no bleeding.  Blood pressures well controlled today it is elevated in the office at 153/75.  She remains on Lipitor which is tolerating well without muscle aches and pains. ROS:   14 system review of systems is positive for vision difficulty, imbalance, walking difficulty, bruising and  all other systems negative  PMH:  Past Medical History:  Diagnosis Date  . Acute ischemic left MCA stroke ([place]) 06/30/2020  . Acute ischemic stroke ([place])   . Acute stroke due to occlusion of left middle cerebral artery ([place]) 06/30/2020  . Age-related nuclear cataract of both eyes 09/28/2015  . Arthralgia of multiple joints 09/28/2015  . Asthma   . Borderline diabetes   . Cerebrovascular accident  (CVA) ([place])   . Chronic bilateral thoracic back pain 06/05/2018  . Chronic renal insufficiency, stage 1 08/11/2014  . Diabetes mellitus type 2 in obese ([place])   . Dyslipidemia   . Dysphagia, post-stroke   . Erosive gastritis 07/20/2019   Formatting of this note might be different from the original. On EGD 07/2019  . Esophagitis 07/20/2019   Formatting of this note might be different from the original. Added automatically from request for surgery [id number]  Formatting of this note might be different from the original. On EGD 07/2019  . Essential hypertension 09/28/2015  . Essential thrombocytosis ([place]) 09/28/2015  . Gallstones 08/18/2019   Formatting of this note might be different from the original. Added automatically from request for surgery [id number]  . Gastroesophageal reflux disease without esophagitis 09/28/2015  . 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 Hyperlipidemia, unspecified 03/16/2013  . Hypertension   . Keratoconjunctivitis sicca of both eyes not specified as Sjogren's 09/28/2015  . Left middle cerebral artery stroke ([place]) 07/09/2020  . Morbid obesity ([place]) 05/21/2013  . Stage 3b chronic kidney disease ([place])   . Status post gastric bypass for obesity 09/28/2015    Social History:  Social History   Socioeconomic History  . Marital status: Divorced    Spouse name: Not on file  . Number of children: Not on file  . Years of education: Not on file  . Highest education level: Not on file  Occupational History  . Not on file  Tobacco Use  . Smoking status: Never Smoker  . Smokeless tobacco: Never Used  Vaping Use  . Vaping Use: Never used  Substance and Sexual Activity  . Alcohol use: No  . Drug use: No  . Sexual activity: Not on file  Other Topics Concern  . Not on file  Social History Narrative   Lives with daughter Courtney Burke   Right Handed   Drinks no caffeine   Social Determinants of Health   Financial Resource Strain: Not on file  Food Insecurity: Not on file   Transportation Needs: Not on file  Physical Activity: Not on file  Stress: Not on file  Social Connections: Not on file  Intimate Partner Violence: Not on file    Medications:   Current Outpatient Medications on File Prior to Visit  Medication Sig Dispense Refill  . acetaminophen (TYLENOL) 325 MG tablet Take 2 tablets (650 mg total) by mouth every 6 (six) hours as needed for mild pain, fever or headache (or temp > 37.5 C (99.5 F)).    Marland Kitchen atorvastatin (LIPITOR) 80 MG tablet Take 1 tablet (80 mg total) by mouth at bedtime. 30 tablet 0  . omeprazole (PRILOSEC) 40 MG capsule Take 40 mg by mouth daily.    . ticagrelor (BRILINTA) 90 MG TABS tablet Take 1 tablet (90 mg total) by mouth 2 (two) times daily. 180 tablet 3  . ursodiol (ACTIGALL) 250 MG tablet Take 250 mg by mouth 2 (two) times daily.    . Vitamin D3 (VITAMIN D) 25 MCG tablet TAKE 1 TABLET (1,000 UNITS TOTAL) BY  MOUTH DAILY BEFORE BREAKFAST. 60 tablet 0   No current facility-administered medications on file prior to visit.    Allergies:   Allergies  Allergen Reactions  . Iodinated Diagnostic Agents Itching  . Doxycycline Nausea And Vomiting  . Sulfa Antibiotics Rash and Hives  . Codeine Hives and Swelling    Swollen tongue  . Hydrocodone Bit-Homatrop Mbr Itching  . Ace Inhibitors Cough, Itching and Rash    Physical Exam General: well developed, well nourished middle-aged African-American lady, seated, in no evident distress Head: head normocephalic and atraumatic.  Neck: supple with no carotid or supraclavicular bruits Cardiovascular: regular rate and rhythm, no murmurs Musculoskeletal: no deformity Skin:  no rash/petichiae Vascular:  Normal pulses all extremities There were no vitals filed for this visit. Neurologic Exam Mental Status: Awake and fully alert. Oriented to place and person recent and remote memory diminished. Attention span, concentration and fund of knowledge appropriate. Mood and affect appropriate.   Moderate fluent aphasia with slight paraphasic errors.  Difficulty with repetition and mild difficulty with comprehension. Cranial Nerves: Fundoscopic exam reveals sharp disc margins. Pupils equal, briskly reactive to light. Extraocular movements full without nystagmus. Visual fields full to confrontation. Hearing intact. Facial sensation intact.  Mild right lower facial asymmetry., tongue, palate moves normally and symmetrically.  Motor: Normal bulk and tone. Normal strength in all tested extremity muscles.  Diminished fine finger movements on the right.  Orbits left over right upper extremity.  Trace right grip and ankle dorsiflexor weakness Sensory.: intact to touch ,pinprick .position and vibratory sensation.  Coordination: Rapid alternating movements normal in all extremities. Finger-to-nose and heel-to-shin performed accurately bilaterally. Gait and Station: Arises from chair without difficulty. Stance is normal. Gait demonstrates slight dragging of the right leg with mild right leg stiffness.  Uses a walker.  Length and balance .  Not able to heel, toe and tandem    Reflexes: 1+ and symmetric. Toes downgoing.   NIHSS  5 Modified [place]  3   ASSESSMENT: 65 year old lady with left MCA infarct due to left MCA occlusion status post mechanical thrombectomy with left MCA rescue stenting in January 2022 with residual aphasia and mild right hemiparesis and gait abnormality.  Vascular risk factors of diabetes, hypertension, obesity and intracranial stenosis.  Etiology of stroke likely intracranial stenosis     PLAN: I had a long d/w patient and her daughter about her recent stroke, left middle cerebral artery stenosis and stenting, risk for recurrent stroke/TIAs, personally independently reviewed imaging studies and stroke evaluation results and answered questions.Continue aspirin 81 mg daily and Brilinta 90 mg twice daily for secondary stroke prevention for her left MCA stent and maintain strict  control of hypertension with blood pressure goal below 130/90, diabetes with hemoglobin A1c goal below 6.5% and lipids with LDL cholesterol goal below 70 mg/dL. I also advised the patient to eat a healthy diet with plenty of whole grains, cereals, fruits and vegetables, exercise regularly and maintain ideal body weight .continue ongoing home physical occupational and speech therapy and subsequently outpatient speech therapy later.  We will hold off on doing loop recorder or prolonged cardiac monitoring as etiology of stroke is likely intracranial stenosis.  She was advised to use a walker at all times and we discussed fall safety precautions.  Followup in the future with my nurse practitioner Janett Billow in 3 months or call earlier if necessary. Greater than 50% of time during this 35 minute visit was spent on counseling,explanation of diagnosis, planning of further management, discussion  with patient and family and coordination of care Antony Contras, MD Note: This document was prepared with digital dictation and possible smart phrase technology. Any transcriptional errors that result from this process are unintentional

## 2020-10-05 NOTE — Patient Instructions (Signed)
I had a long d/w patient and her daughter about her recent stroke, left middle cerebral artery stenosis and stenting, risk for recurrent stroke/TIAs, personally independently reviewed imaging studies and stroke evaluation results and answered questions.Continue aspirin 81 mg daily and Brilinta 90 mg twice daily for secondary stroke prevention for her left MCA stent and maintain strict control of hypertension with blood pressure goal below 130/90, diabetes with hemoglobin A1c goal below 6.5% and lipids with LDL cholesterol goal below 70 mg/dL. I also advised the patient to eat a healthy diet with plenty of whole grains, cereals, fruits and vegetables, exercise regularly and maintain ideal body weight .continue ongoing home physical occupational and speech therapy and subsequently outpatient speech therapy later.  She was advised to use a walker at all times and we discussed fall safety precautions.  Followup in the future with my nurse practitioner Janett Billow in 3 months or call earlier if necessary.  Stroke Prevention Some medical conditions and behaviors are associated with a higher chance of having a stroke. You can help prevent a stroke by making nutrition, lifestyle, and other changes, including managing any medical conditions you may have. What nutrition changes can be made?  Eat healthy foods. You can do this by: ? Choosing foods high in fiber, such as fresh fruits and vegetables and whole grains. ? Eating at least 5 or more servings of fruits and vegetables a day. Try to fill half of your plate at each meal with fruits and vegetables. ? Choosing lean protein foods, such as lean cuts of meat, poultry without skin, fish, tofu, beans, and nuts. ? Eating low-fat dairy products. ? Avoiding foods that are high in salt (sodium). This can help lower blood pressure. ? Avoiding foods that have saturated fat, trans fat, and cholesterol. This can help prevent high cholesterol. ? Avoiding processed and premade  foods.  Follow your health care provider's specific guidelines for losing weight, controlling high blood pressure (hypertension), lowering high cholesterol, and managing diabetes. These may include: ? Reducing your daily calorie intake. ? Limiting your daily sodium intake to 1,500 milligrams (mg). ? Using only healthy fats for cooking, such as olive oil, canola oil, or sunflower oil. ? Counting your daily carbohydrate intake.   What lifestyle changes can be made?  Maintain a healthy weight. Talk to your health care provider about your ideal weight.  Get at least 30 minutes of moderate physical activity at least 5 days a week. Moderate activity includes brisk walking, biking, and swimming.  Do not use any products that contain nicotine or tobacco, such as cigarettes and e-cigarettes. If you need help quitting, ask your health care provider. It may also be helpful to avoid exposure to secondhand smoke.  Limit alcohol intake to no more than 1 drink a day for nonpregnant women and 2 drinks a day for men. One drink equals 12 oz of beer, 5 oz of wine, or 1 oz of hard liquor.  Stop any illegal drug use.  Avoid taking birth control pills. Talk to your health care provider about the risks of taking birth control pills if: ? You are over 24 years old. ? You smoke. ? You get migraines. ? You have ever had a blood clot. What other changes can be made?  Manage your cholesterol levels. ? Eating a healthy diet is important for preventing high cholesterol. If cholesterol cannot be managed through diet alone, you may also need to take medicines. ? Take any prescribed medicines to control your cholesterol as  told by your health care provider.  Manage your diabetes. ? Eating a healthy diet and exercising regularly are important parts of managing your blood sugar. If your blood sugar cannot be managed through diet and exercise, you may need to take medicines. ? Take any prescribed medicines to control  your diabetes as told by your health care provider.  Control your hypertension. ? To reduce your risk of stroke, try to keep your blood pressure below 130/80. ? Eating a healthy diet and exercising regularly are an important part of controlling your blood pressure. If your blood pressure cannot be managed through diet and exercise, you may need to take medicines. ? Take any prescribed medicines to control hypertension as told by your health care provider. ? Ask your health care provider if you should monitor your blood pressure at home. ? Have your blood pressure checked every year, even if your blood pressure is normal. Blood pressure increases with age and some medical conditions.  Get evaluated for sleep disorders (sleep apnea). Talk to your health care provider about getting a sleep evaluation if you snore a lot or have excessive sleepiness.  Take over-the-counter and prescription medicines only as told by your health care provider. Aspirin or blood thinners (antiplatelets or anticoagulants) may be recommended to reduce your risk of forming blood clots that can lead to stroke.  Make sure that any other medical conditions you have, such as atrial fibrillation or atherosclerosis, are managed. What are the warning signs of a stroke? The warning signs of a stroke can be easily remembered as BEFAST.  B is for balance. Signs include: ? Dizziness. ? Loss of balance or coordination. ? Sudden trouble walking.  E is for eyes. Signs include: ? A sudden change in vision. ? Trouble seeing.  F is for face. Signs include: ? Sudden weakness or numbness of the face. ? The face or eyelid drooping to one side.  A is for arms. Signs include: ? Sudden weakness or numbness of the arm, usually on one side of the body.  S is for speech. Signs include: ? Trouble speaking (aphasia). ? Trouble understanding.  T is for time. ? These symptoms may represent a serious problem that is an emergency. Do not  wait to see if the symptoms will go away. Get medical help right away. Call your local emergency services (911 in the [place] Do not drive yourself to the hospital.  Other signs of stroke may include: ? A sudden, severe headache with no known cause. ? Nausea or vomiting. ? Seizure. Where to find more information For more information, visit:  American Stroke Association: www.strokeassociation.org  National Stroke Association: www.stroke.org Summary  You can prevent a stroke by eating healthy, exercising, not smoking, limiting alcohol intake, and managing any medical conditions you may have.  Do not use any products that contain nicotine or tobacco, such as cigarettes and e-cigarettes. If you need help quitting, ask your health care provider. It may also be helpful to avoid exposure to secondhand smoke.  Remember BEFAST for warning signs of stroke. Get help right away if you or a loved one has any of these signs. This information is not intended to replace advice given to you by your health care provider. Make sure you discuss any questions you have with your health care provider. Document Revised: 05/03/2017 Document Reviewed: 06/26/2016 Elsevier Patient Education  2021 [employer].

## 2020-10-05 NOTE — Telephone Encounter (Signed)
ILR implanted for CVA 09/08/2020. Remote received 10/05/20 for 36 AF events, ECG's appear AT/AF. Some p waves are noted but suggestive of AF. VR's are elevated. No OAC on file. Reviewed with Dr. Lovena Le and he agrees AF. Would like to follow up with Dr. Curt Bears for review.  Spoke to patients daughter Kristeen Miss, on [place]), reports 2 weeks ago she had chest pain, laid down then resolved. Unable to recall any other symptoms or duration. Daughter states patient has difficulty with her speech and becomes very upset while trying to communicate with others. Expressed understanding to daughter.   Advised I will forward to Dr. Curt Bears and we will call with any changes.

## 2020-10-06 ENCOUNTER — Other Ambulatory Visit: Payer: Self-pay

## 2020-10-06 ENCOUNTER — Encounter (HOSPITAL_COMMUNITY): Payer: Self-pay | Admitting: Nurse Practitioner

## 2020-10-06 ENCOUNTER — Ambulatory Visit (HOSPITAL_COMMUNITY)
Admission: RE | Admit: 2020-10-06 | Discharge: 2020-10-06 | Disposition: A | Discharge status: Home / Self Care | Payer: Medicare Other | Source: Ambulatory Visit | Attending: Nurse Practitioner | Admitting: Nurse Practitioner

## 2020-10-06 VITALS — BP 136/72 | HR 67 | Ht 62.0 in | Wt 181.4 lb

## 2020-10-06 DIAGNOSIS — I129 Hypertensive chronic kidney disease with stage 1 through stage 4 chronic kidney disease, or unspecified chronic kidney disease: Secondary | ICD-10-CM | POA: Insufficient documentation

## 2020-10-06 DIAGNOSIS — I4891 Unspecified atrial fibrillation: Secondary | ICD-10-CM | POA: Diagnosis present

## 2020-10-06 DIAGNOSIS — N1831 Chronic kidney disease, stage 3a: Secondary | ICD-10-CM | POA: Insufficient documentation

## 2020-10-06 DIAGNOSIS — E669 Obesity, unspecified: Secondary | ICD-10-CM | POA: Insufficient documentation

## 2020-10-06 DIAGNOSIS — Z79899 Other long term (current) drug therapy: Secondary | ICD-10-CM | POA: Diagnosis not present

## 2020-10-06 DIAGNOSIS — Z8673 Personal history of transient ischemic attack (TIA), and cerebral infarction without residual deficits: Secondary | ICD-10-CM | POA: Diagnosis not present

## 2020-10-06 DIAGNOSIS — Z7901 Long term (current) use of anticoagulants: Secondary | ICD-10-CM | POA: Diagnosis not present

## 2020-10-06 LAB — CBC
HCT: 36.9 % (ref 36.0–46.0)
Hemoglobin: 12.6 g/dL (ref 12.0–15.0)
MCH: 28.3 pg (ref 26.0–34.0)
MCHC: 34.1 g/dL (ref 30.0–36.0)
MCV: 82.7 fL (ref 80.0–100.0)
Platelets: 535 10*3/uL — ABNORMAL HIGH (ref 150–400)
RBC: 4.46 MIL/uL (ref 3.87–5.11)
RDW: 13.6 % (ref 11.5–15.5)
WBC: 5.5 10*3/uL (ref 4.0–10.5)
nRBC: 0 % (ref 0.0–0.2)

## 2020-10-06 LAB — BASIC METABOLIC PANEL
Anion gap: 8 (ref 5–15)
BUN: 7 mg/dL — ABNORMAL LOW (ref 8–23)
CO2: 24 mmol/L (ref 22–32)
Calcium: 8.7 mg/dL — ABNORMAL LOW (ref 8.9–10.3)
Chloride: 109 mmol/L (ref 98–111)
Creatinine, Ser: 1.4 mg/dL — ABNORMAL HIGH (ref 0.44–1.00)
GFR, Estimated: 41 mL/min — ABNORMAL LOW (ref >60)
Glucose, Bld: 92 mg/dL (ref 70–99)
Potassium: 2.9 mmol/L — ABNORMAL LOW (ref 3.5–5.1)
Sodium: 141 mmol/L (ref 135–145)

## 2020-10-06 MED ORDER — ELIQUIS 5 MG PO TABS
5.0000 mg | ORAL_TABLET | Freq: Two times a day (BID) | ORAL | 3 refills | Status: DC
Start: 2020-10-06 — End: 2021-02-08

## 2020-10-06 NOTE — Telephone Encounter (Signed)
Called and left message for patients daughter, Ala Bent on file, to call back so we could try to get patient scheduled to be seen later today 10/06/20.

## 2020-10-06 NOTE — Telephone Encounter (Signed)
Patient's daughter [name], returned my call.  I offered an appt for this afternoon 10/06/20 for patient.  Patients daughter stated she needs to check her mom's calendar to be sure there are no other appts this afternoon.  Patient's daughter states she will call back in a few minutes to let me know if pt can come today.

## 2020-10-06 NOTE — Patient Instructions (Signed)
Stop aspirin  Start Eliquis 5 mg twice a day.

## 2020-10-06 NOTE — Telephone Encounter (Signed)
Patient's daughter, [name], called back and scheduled patient for 1:30 pm today with Roderic Palau, NP.

## 2020-10-06 NOTE — Progress Notes (Signed)
Primary Care Physician: [name], PA-C Referring Physician: Device clinic  EP: Dr. Curt Bears Neuro: Dr. Zachary George Courtney Burke is a 65 y.o. female with a h/o diabetes, hypertension, chronic kidney disease stage IIIa, dysphagia, obesity, s/p gastric sleeve surgery in December 2021 and stroke in January 2022.  After discussion of risk benefits with the patient's family patient was taken for emergent mechanical thrombectomy which was performed by Dr. Estanislado Pandy but there was underlying MCA stenosis requiring rescue MCA stenting with resultant TICI 2C revascularization   Patient was started on aspirin and Brilinta for MCA stent.   SHe was seen by Dr. Leonie Man yesterday and ASA/brillinta was to be continued. She  was noted yesterday pm to have afib on her device so was referred to afib clinic to discuss DOAC. 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 Most of the history comes from  the daughter, but pt is alert and comprehends the conversation.   Today, she denies symptoms of palpitations, chest pain, shortness of breath, orthopnea, PND, lower extremity edema, dizziness, presyncope, syncope, or neurologic sequela. The patient is tolerating medications without difficulties and is otherwise without complaint today.   Past Medical History:  Diagnosis Date  . Acute ischemic left MCA stroke ([place]) 06/30/2020  . Acute ischemic stroke ([place])   . Acute stroke due to occlusion of left middle cerebral artery ([place]) 06/30/2020  . Age-related nuclear cataract of both eyes 09/28/2015  . Arthralgia of multiple joints 09/28/2015  . Asthma   . Borderline diabetes   . Cerebrovascular accident (CVA) ([place])   . Chronic bilateral thoracic back pain 06/05/2018  . Chronic renal insufficiency, stage 1 08/11/2014  . Diabetes mellitus type 2 in obese ([place])   . Dyslipidemia   . Dysphagia, post-stroke   . Erosive gastritis 07/20/2019   Formatting of this note might be different from the  original. On EGD 07/2019  . Esophagitis 07/20/2019   Formatting of this note might be different from the original. Added automatically from request for surgery [id number]  Formatting of this note might be different from the original. On EGD 07/2019  . Essential hypertension 09/28/2015  . Essential thrombocytosis ([place]) 09/28/2015  . Gallstones 08/18/2019   Formatting of this note might be different from the original. Added automatically from request for surgery [id number]  . Gastroesophageal reflux disease without esophagitis 09/28/2015  . 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 Hyperlipidemia, unspecified 03/16/2013  . Hypertension   . Keratoconjunctivitis sicca of both eyes not specified as Sjogren's 09/28/2015  . Left middle cerebral artery stroke ([place]) 07/09/2020  . Morbid obesity ([place]) 05/21/2013  . Stage 3b chronic kidney disease ([place])   . Status post gastric bypass for obesity 09/28/2015   Past Surgical History:  Procedure Laterality Date  . ANKLE SURGERY    . CARPAL TUNNEL RELEASE    . carpel tunnel    . IR CT HEAD LTD  06/30/2020  . IR INTRA CRAN STENT  06/30/2020  . IR PERCUTANEOUS ART THROMBECTOMY/INFUSION INTRACRANIAL INC DIAG ANGIO  06/30/2020  . IR RADIOLOGIST EVAL & MGMT  08/26/2020  . RADIOLOGY WITH ANESTHESIA N/A 06/30/2020   Procedure: RADIOLOGY WITH ANESTHESIA;  Surgeon: Radiologist, Medication, MD;  Location: [facility];  Service: Radiology;  Laterality: N/A;    Current Outpatient Medications  Medication Sig Dispense Refill  . acetaminophen (TYLENOL) 325 MG tablet Take 2 tablets (650 mg total) by mouth every 6 (six) hours as needed for mild pain,  fever or headache (or temp > 37.5 C (99.5 F)).    Marland Kitchen apixaban (ELIQUIS) 5 MG TABS tablet Take 1 tablet (5 mg total) by mouth 2 (two) times daily. 60 tablet 3  . atorvastatin (LIPITOR) 80 MG tablet Take 1 tablet (80 mg total) by mouth at bedtime. 30 tablet 0  . omeprazole (PRILOSEC) 40 MG capsule Take 40 mg by mouth daily.    . ticagrelor  (BRILINTA) 90 MG TABS tablet Take 1 tablet (90 mg total) by mouth 2 (two) times daily. 180 tablet 3  . ursodiol (ACTIGALL) 250 MG tablet Take 250 mg by mouth 2 (two) times daily.    . Vitamin D3 (VITAMIN D) 25 MCG tablet TAKE 1 TABLET (1,000 UNITS TOTAL) BY MOUTH DAILY BEFORE BREAKFAST. 60 tablet 0  . Multiple Vitamin (MULTIVITAMIN WITH MINERALS) TABS tablet Take 1 tablet by mouth daily.     No current facility-administered medications for this encounter.    Allergies  Allergen Reactions  . Iodinated Diagnostic Agents Itching  . Doxycycline Nausea And Vomiting  . Sulfa Antibiotics Rash and Hives  . Codeine Hives and Swelling    Swollen tongue  . Hydrocodone Bit-Homatrop Mbr Itching  . Ace Inhibitors Cough, Itching and Rash    Social History   Socioeconomic History  . Marital status: Divorced    Spouse name: Not on file  . Number of children: Not on file  . Years of education: Not on file  . Highest education level: Not on file  Occupational History  . Not on file  Tobacco Use  . Smoking status: Never Smoker  . Smokeless tobacco: Never Used  Vaping Use  . Vaping Use: Never used  Substance and Sexual Activity  . Alcohol use: No  . Drug use: No  . Sexual activity: Not on file  Other Topics Concern  . Not on file  Social History Narrative   Lives with daughter [name]   Right Handed   Drinks no caffeine   Social Determinants of Health   Financial Resource Strain: Not on file  Food Insecurity: Not on file  Transportation Needs: Not on file  Physical Activity: Not on file  Stress: Not on file  Social Connections: Not on file  Intimate Partner Violence: Not on file    Family History  Problem Relation Age of Onset  . Stroke Maternal Grandfather     ROS- All systems are reviewed and negative except as per the HPI above  Physical Exam: Vitals:   10/06/20 1327  BP: 136/72  Pulse: 67  Weight: 82.3 kg  Height: 5\' 2"  (1.575 m)   Wt Readings from Last 3  Encounters:  10/06/20 82.3 kg  10/05/20 82.8 kg  09/08/20 87.6 kg    Labs: Lab Results  Component Value Date   NA 141 10/06/2020   K 2.9 (L) 10/06/2020   CL 109 10/06/2020   CO2 24 10/06/2020   GLUCOSE 92 10/06/2020   BUN 7 (L) 10/06/2020   CREATININE 1.40 (H) 10/06/2020   CALCIUM 8.7 (L) 10/06/2020   PHOS 3.6 07/07/2020   MG 2.1 07/07/2020   Lab Results  Component Value Date   INR 1.1 06/30/2020   Lab Results  Component Value Date   CHOL 143 07/01/2020   HDL 41 07/01/2020   LDLCALC 68 07/01/2020   TRIG 172 (H) 07/01/2020   TRIG 164 (H) 07/01/2020     GEN- The patient is well appearing, alert and oriented x 3 today.   Head- normocephalic,  atraumatic Eyes-  Sclera clear, conjunctiva pink Ears- hearing intact Oropharynx- clear Neck- supple, no JVP Lymph- no cervical lymphadenopathy Lungs- Clear to ausculation bilaterally, normal work of breathing Heart- Regular rate and rhythm, no murmurs, rubs or gallops, PMI not laterally displaced GI- soft, NT, ND, + BS Extremities- no clubbing, cyanosis, or edema MS- no significant deformity or atrophy Skin- no rash or lesion Psych- euthymic mood, full affect Neuro- strength and sensation are intact  EKG-NSR at 67 bpm, pr int 152 ms, qrs int 80 ms, qtc 452 ms  Device report reviewed that showed frequent afib episodes  Epic records reviewed    Assessment and Plan: 1.  New onset afib S/p stroke in 2022 with Linq implanted and now showing afib General  education re afib Not sure if pt is really symptomatic with these episodes as it is hard for her to communicate her thoughts/feelings For now will hold off on rate control but will probably add that on next visit No significant triggers identified  2. CHA2DS2VASc score of 6 I discussed with Dr. Leonie Man needing  to add DOAC with afib being seen on LinQ He said that she could stop ASA but to continue brilinta for MCA stenting with resultant TICI 2C revascularization I will  add eliquis 5 mg bid  I discussed in detail bleeding risks with both drugs  and to be very observant  for any signs of bleeding and report to PCP if occurs If falls and hits head will need to report to the ER  Cbc/ bmet today   I will see back in 2 weeks, review LInq  and consider adding rate control   Trampus Mcquerry C. Jamie Belger, [facility] [address] [place], [place] [address] [phone]

## 2020-10-07 ENCOUNTER — Other Ambulatory Visit: Payer: Self-pay

## 2020-10-07 ENCOUNTER — Other Ambulatory Visit (HOSPITAL_COMMUNITY): Payer: Self-pay | Admitting: *Deleted

## 2020-10-07 MED ORDER — POTASSIUM CHLORIDE CRYS ER 20 MEQ PO TBCR: 20.0000 meq | EXTENDED_RELEASE_TABLET | Freq: Every day | ORAL | 3 refills | Status: DC

## 2020-10-07 NOTE — Patient Outreach (Signed)
[facility]) Care Management  10/07/2020  Courtney Burke 07-06-1955 [id number]   mRS was successfully completed by Dr. Leonie Man on 10/05/20. MRS= 3   [place] Care Management Assistant

## 2020-10-11 LAB — CUP PACEART REMOTE DEVICE CHECK
Date Time Interrogation Session: 20220509000412
Implantable Pulse Generator Implant Date: 20220407

## 2020-10-12 ENCOUNTER — Ambulatory Visit (INDEPENDENT_AMBULATORY_CARE_PROVIDER_SITE_OTHER): Payer: Medicare Other

## 2020-10-12 DIAGNOSIS — I639 Cerebral infarction, unspecified: Secondary | ICD-10-CM

## 2020-10-13 LAB — CUP PACEART REMOTE DEVICE CHECK
Date Time Interrogation Session: 20220511115845
Implantable Pulse Generator Implant Date: 20220407

## 2020-10-14 ENCOUNTER — Encounter: Payer: Medicare Other | Attending: Physical Medicine & Rehabilitation | Admitting: Physical Medicine & Rehabilitation

## 2020-10-14 ENCOUNTER — Other Ambulatory Visit: Payer: Self-pay

## 2020-10-14 ENCOUNTER — Encounter: Payer: Self-pay | Admitting: Physical Medicine & Rehabilitation

## 2020-10-14 VITALS — BP 177/86 | HR 72 | Temp 98.4°F | Ht 62.0 in | Wt 178.6 lb

## 2020-10-14 DIAGNOSIS — I63512 Cerebral infarction due to unspecified occlusion or stenosis of left middle cerebral artery: Secondary | ICD-10-CM

## 2020-10-14 NOTE — Progress Notes (Signed)
Subjective:    Patient ID: Courtney Burke, female    DOB: [birth year], 65 y.o.   MRN: [id number]  HPI 65 year old female with history of left MCA distribution infarct with right hemiparesis and aphasia who returns today with primary issue of speech problems with mobility issues.  She continues to follow up with neurology as well as primary care.  Home health PT and speech have been coming out although the family states that PT has not come out this week.  We discussed the option of outpatient therapy if the patient has transportation.     Pain Inventory Average Pain 0 Pain Right Now 0 My pain is intermittent and sharp  LOCATION OF PAIN  na  BOWEL Number of stools per week: 14 Oral laxative use No  Type of laxative na Enema or suppository use No  History of colostomy No  Incontinent No   BLADDER Normal In and out cath, frequency na Able to self cath na Bladder incontinence No  Frequent urination No  Leakage with coughing No  Difficulty starting stream No  Incomplete bladder emptying No    Mobility walk without assistance how many minutes can you walk? 15 ability to climb steps?  yes do you drive?  no  Function retired  Neuro/Psych bowel control problems trouble walking depression  Prior Studies Any changes since last visit?  no  Physicians involved in your care Any changes since last visit?  yes Cardiologist   Family History  Problem Relation Age of Onset  . Stroke Maternal Grandfather    Social History   Socioeconomic History  . Marital status: Divorced    Spouse name: Not on file  . Number of children: Not on file  . Years of education: Not on file  . Highest education level: Not on file  Occupational History  . Not on file  Tobacco Use  . Smoking status: Never Smoker  . Smokeless tobacco: Never Used  Vaping Use  . Vaping Use: Never used  Substance and Sexual Activity  . Alcohol use: No  . Drug use: No  . Sexual activity: Not on  file  Other Topics Concern  . Not on file  Social History Narrative   Lives with daughter [name]   Right Handed   Drinks no caffeine   Social Determinants of Health   Financial Resource Strain: Not on file  Food Insecurity: Not on file  Transportation Needs: Not on file  Physical Activity: Not on file  Stress: Not on file  Social Connections: Not on file   Past Surgical History:  Procedure Laterality Date  . ANKLE SURGERY    . CARPAL TUNNEL RELEASE    . carpel tunnel    . IR CT HEAD LTD  06/30/2020  . IR INTRA CRAN STENT  06/30/2020  . IR PERCUTANEOUS ART THROMBECTOMY/INFUSION INTRACRANIAL INC DIAG ANGIO  06/30/2020  . IR RADIOLOGIST EVAL & MGMT  08/26/2020  . RADIOLOGY WITH ANESTHESIA N/A 06/30/2020   Procedure: RADIOLOGY WITH ANESTHESIA;  Surgeon: Radiologist, Medication, MD;  Location: [place] [place];  Service: Radiology;  Laterality: N/A;   Past Medical History:  Diagnosis Date  . Acute ischemic left MCA stroke ([place]) 06/30/2020  . Acute ischemic stroke ([place])   . Acute stroke due to occlusion of left middle cerebral artery ([place]) 06/30/2020  . Age-related nuclear cataract of both eyes 09/28/2015  . Arthralgia of multiple joints 09/28/2015  . Asthma   . Borderline diabetes   . Cerebrovascular accident (CVA) ([place])   .  Chronic bilateral thoracic back pain 06/05/2018  . Chronic renal insufficiency, stage 1 08/11/2014  . Diabetes mellitus type 2 in obese ([place])   . Dyslipidemia   . Dysphagia, post-stroke   . Erosive gastritis 07/20/2019   Formatting of this note might be different from the original. On EGD 07/2019  . Esophagitis 07/20/2019   Formatting of this note might be different from the original. Added automatically from request for surgery [id number]  Formatting of this note might be different from the original. On EGD 07/2019  . Essential hypertension 09/28/2015  . Essential thrombocytosis ([place]) 09/28/2015  . Gallstones 08/18/2019   Formatting of this note might be different from the original.  Added automatically from request for surgery [id number]  . Gastroesophageal reflux disease without esophagitis 09/28/2015  . 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 Hyperlipidemia, unspecified 03/16/2013  . Hypertension   . Keratoconjunctivitis sicca of both eyes not specified as Sjogren's 09/28/2015  . Left middle cerebral artery stroke ([place]) 07/09/2020  . Morbid obesity ([place]) 05/21/2013  . Stage 3b chronic kidney disease ([place])   . Status post gastric bypass for obesity 09/28/2015   BP (!) 177/86   Pulse 72   Temp 98.4 F (36.9 C)   Ht 5\' 2"  (1.575 m)   Wt 178 lb 9.6 oz (81 kg)   LMP 09/28/2012   SpO2 98%   BMI 32.67 kg/m   Opioid Risk Score:   Fall Risk Score:  `1  Depression screen PHQ 2/9  Depression screen [facility] 2/9 09/02/2020 08/04/2020  Decreased Interest 0 0  Down, Depressed, Hopeless 0 1  PHQ - 2 Score 0 1  Altered sleeping - 0  Tired, decreased energy - 0  Change in appetite - 0  Feeling bad or failure about yourself  - 1  Trouble concentrating - 0  Moving slowly or fidgety/restless - 1  Suicidal thoughts - 0  PHQ-9 Score - 3    Review of Systems  Musculoskeletal: Positive for gait problem.  Psychiatric/Behavioral: Positive for dysphoric mood.  All other systems reviewed and are negative.      Objective:   Physical Exam Vitals and nursing note reviewed.  Constitutional:      Appearance: She is obese.  Eyes:     Extraocular Movements: Extraocular movements intact.     Conjunctiva/sclera: Conjunctivae normal.     Pupils: Pupils are equal, round, and reactive to light.  Neurological:     Mental Status: She is alert.     Comments: Patient is aphasic she has good fluency some word substitutions are noted.  She has impaired comprehension as well.  She has improving naming she can name a pen but calls a watch a clock. Motor strength is 4+ in the right deltoid bicep tricep grip hip flexor knee extensor ankle dorsiflexion and 5/5 in the left deltoid bicep tricep grip  hip flexion knee extension ankle dorsiflexion Ambulates with hand-held assistance wide base of support poor heel strike tends to shuffle.  Patient appears to be concentrating very hard on walking looks down rather than forward  Psychiatric:        Mood and Affect: Mood normal.        Behavior: Behavior normal.           Assessment & Plan:  #1.  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  She is fairly independent with her self-care skills.  Her right upper extremity strength is good and fine motor is  fair to good. I do think she would benefit from outpatient speech therapy as well as PT and family will call me if they can arrange transportation. I will see the patient back in 3 months

## 2020-10-14 NOTE — Patient Instructions (Signed)
Please call if you'd like an order for outpt therapy

## 2020-10-20 ENCOUNTER — Other Ambulatory Visit: Payer: Self-pay

## 2020-10-20 ENCOUNTER — Other Ambulatory Visit (HOSPITAL_COMMUNITY): Payer: Self-pay | Admitting: *Deleted

## 2020-10-20 ENCOUNTER — Ambulatory Visit (HOSPITAL_COMMUNITY)
Admission: RE | Admit: 2020-10-20 | Discharge: 2020-10-20 | Disposition: A | Discharge status: Home / Self Care | Payer: Medicare Other | Source: Ambulatory Visit | Attending: Nurse Practitioner | Admitting: Nurse Practitioner

## 2020-10-20 ENCOUNTER — Encounter (HOSPITAL_COMMUNITY): Payer: Self-pay | Admitting: Nurse Practitioner

## 2020-10-20 ENCOUNTER — Ambulatory Visit (HOSPITAL_COMMUNITY): Payer: Medicare Other | Admitting: Nurse Practitioner

## 2020-10-20 VITALS — BP 122/72 | HR 77 | Ht 62.0 in | Wt 178.0 lb

## 2020-10-20 DIAGNOSIS — Z7901 Long term (current) use of anticoagulants: Secondary | ICD-10-CM | POA: Insufficient documentation

## 2020-10-20 DIAGNOSIS — I4891 Unspecified atrial fibrillation: Secondary | ICD-10-CM | POA: Diagnosis present

## 2020-10-20 DIAGNOSIS — D6869 Other thrombophilia: Secondary | ICD-10-CM | POA: Diagnosis not present

## 2020-10-20 LAB — CBC
HCT: 35 % — ABNORMAL LOW (ref 36.0–46.0)
Hemoglobin: 11.7 g/dL — ABNORMAL LOW (ref 12.0–15.0)
MCH: 28.1 pg (ref 26.0–34.0)
MCHC: 33.4 g/dL (ref 30.0–36.0)
MCV: 83.9 fL (ref 80.0–100.0)
Platelets: 496 10*3/uL — ABNORMAL HIGH (ref 150–400)
RBC: 4.17 MIL/uL (ref 3.87–5.11)
RDW: 13.2 % (ref 11.5–15.5)
WBC: 5.1 10*3/uL (ref 4.0–10.5)
nRBC: 0 % (ref 0.0–0.2)

## 2020-10-20 LAB — BASIC METABOLIC PANEL
Anion gap: 7 (ref 5–15)
BUN: 7 mg/dL — ABNORMAL LOW (ref 8–23)
CO2: 23 mmol/L (ref 22–32)
Calcium: 8.8 mg/dL — ABNORMAL LOW (ref 8.9–10.3)
Chloride: 111 mmol/L (ref 98–111)
Creatinine, Ser: 1.28 mg/dL — ABNORMAL HIGH (ref 0.44–1.00)
GFR, Estimated: 46 mL/min — ABNORMAL LOW (ref >60)
Glucose, Bld: 111 mg/dL — ABNORMAL HIGH (ref 70–99)
Potassium: 2.4 mmol/L — CL (ref 3.5–5.1)
Sodium: 141 mmol/L (ref 135–145)

## 2020-10-20 MED ORDER — POTASSIUM CHLORIDE CRYS ER 20 MEQ PO TBCR: 20.0000 meq | EXTENDED_RELEASE_TABLET | Freq: Two times a day (BID) | ORAL | 3 refills | Status: DC

## 2020-10-20 NOTE — Progress Notes (Signed)
Primary Care Physician: [name], PA-C Referring Physician: Device clinic  EP: Dr. Curt Bears Neuro: Dr. Zachary George Emileigh Kellett is a 66 y.o. female with a h/o diabetes, hypertension, chronic kidney disease stage IIIa, dysphagia, obesity, s/p gastric sleeve surgery in December 2021 and stroke in January 2022.  After discussion of risk benefits with the patient's family patient was taken for emergent mechanical thrombectomy which was performed by Dr. Estanislado Pandy but there was underlying MCA stenosis requiring rescue MCA stenting with resultant TICI 2C revascularization   Patient was started on aspirin and Brilinta for MCA stent.   SHe was seen by Dr. Leonie Man yesterday and ASA/brillinta were  to be continued. She  was noted yesterday pm to have afib on her device so was referred to afib clinic to discuss DOAC. 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 Most of the history comes from  the daughter, but pt is alert and comprehends the conversation.   F/u in afib clinic, 10/20/20. She  is now on eliquis with brilinta being continued and asa stopped per Dr. Leonie Man. No issues with bleeding. Bleeding precautions again discussed.no complaints today.    Today, she denies symptoms of palpitations, chest pain, shortness of breath, orthopnea, PND, lower extremity edema, dizziness, presyncope, syncope, or neurologic sequela. The patient is tolerating medications without difficulties and is otherwise without complaint today.   Past Medical History:  Diagnosis Date  . Acute ischemic left MCA stroke ([place]) 06/30/2020  . Acute ischemic stroke ([place])   . Acute stroke due to occlusion of left middle cerebral artery ([place]) 06/30/2020  . Age-related nuclear cataract of both eyes 09/28/2015  . Arthralgia of multiple joints 09/28/2015  . Asthma   . Borderline diabetes   . Cerebrovascular accident (CVA) ([place])   . Chronic bilateral thoracic back pain 06/05/2018  . Chronic renal  insufficiency, stage 1 08/11/2014  . Diabetes mellitus type 2 in obese ([place])   . Dyslipidemia   . Dysphagia, post-stroke   . Erosive gastritis 07/20/2019   Formatting of this note might be different from the original. On EGD 07/2019  . Esophagitis 07/20/2019   Formatting of this note might be different from the original. Added automatically from request for surgery [id number]  Formatting of this note might be different from the original. On EGD 07/2019  . Essential hypertension 09/28/2015  . Essential thrombocytosis ([place]) 09/28/2015  . Gallstones 08/18/2019   Formatting of this note might be different from the original. Added automatically from request for surgery [id number]  . Gastroesophageal reflux disease without esophagitis 09/28/2015  . 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 Hyperlipidemia, unspecified 03/16/2013  . Hypertension   . Keratoconjunctivitis sicca of both eyes not specified as Sjogren's 09/28/2015  . Left middle cerebral artery stroke ([place]) 07/09/2020  . Morbid obesity ([place]) 05/21/2013  . Stage 3b chronic kidney disease ([place])   . Status post gastric bypass for obesity 09/28/2015   Past Surgical History:  Procedure Laterality Date  . ANKLE SURGERY    . CARPAL TUNNEL RELEASE    . carpel tunnel    . IR CT HEAD LTD  06/30/2020  . IR INTRA CRAN STENT  06/30/2020  . IR PERCUTANEOUS ART THROMBECTOMY/INFUSION INTRACRANIAL INC DIAG ANGIO  06/30/2020  . IR RADIOLOGIST EVAL & MGMT  08/26/2020  . RADIOLOGY WITH ANESTHESIA N/A 06/30/2020   Procedure: RADIOLOGY WITH ANESTHESIA;  Surgeon: Radiologist, Medication, MD;  Location: [facility];  Service: Radiology;  Laterality: N/A;  Current Outpatient Medications  Medication Sig Dispense Refill  . acetaminophen (TYLENOL) 325 MG tablet Take 2 tablets (650 mg total) by mouth every 6 (six) hours as needed for mild pain, fever or headache (or temp > 37.5 C (99.5 F)).    Marland Kitchen apixaban (ELIQUIS) 5 MG TABS tablet Take 1 tablet (5 mg total) by mouth 2 (two) times  daily. 60 tablet 3  . atorvastatin (LIPITOR) 80 MG tablet Take 1 tablet (80 mg total) by mouth at bedtime. 30 tablet 0  . Multiple Vitamin (MULTIVITAMIN WITH MINERALS) TABS tablet Take 1 tablet by mouth daily.    Marland Kitchen omeprazole (PRILOSEC) 40 MG capsule Take 40 mg by mouth daily.    . potassium chloride SA (KLOR-CON) 20 MEQ tablet Take 1 tablet (20 mEq total) by mouth daily. 30 tablet 3  . ticagrelor (BRILINTA) 90 MG TABS tablet Take 1 tablet (90 mg total) by mouth 2 (two) times daily. 180 tablet 3  . ursodiol (ACTIGALL) 250 MG tablet Take 250 mg by mouth 2 (two) times daily.    . Vitamin D3 (VITAMIN D) 25 MCG tablet TAKE 1 TABLET (1,000 UNITS TOTAL) BY MOUTH DAILY BEFORE BREAKFAST. 60 tablet 0   No current facility-administered medications for this encounter.    Allergies  Allergen Reactions  . Iodinated Diagnostic Agents Itching  . Doxycycline Nausea And Vomiting  . Sulfa Antibiotics Rash and Hives  . Codeine Hives and Swelling    Swollen tongue  . Hydrocodone Bit-Homatrop Mbr Itching  . Ace Inhibitors Cough, Itching and Rash    Social History   Socioeconomic History  . Marital status: Divorced    Spouse name: Not on file  . Number of children: Not on file  . Years of education: Not on file  . Highest education level: Not on file  Occupational History  . Not on file  Tobacco Use  . Smoking status: Never Smoker  . Smokeless tobacco: Never Used  Vaping Use  . Vaping Use: Never used  Substance and Sexual Activity  . Alcohol use: No  . Drug use: No  . Sexual activity: Not on file  Other Topics Concern  . Not on file  Social History Narrative   Lives with daughter [name]   Right Handed   Drinks no caffeine   Social Determinants of Health   Financial Resource Strain: Not on file  Food Insecurity: Not on file  Transportation Needs: Not on file  Physical Activity: Not on file  Stress: Not on file  Social Connections: Not on file  Intimate Partner Violence: Not on file     Family History  Problem Relation Age of Onset  . Stroke Maternal Grandfather     ROS- All systems are reviewed and negative except as per the HPI above  Physical Exam: Vitals:   10/20/20 1144  BP: 122/72  Pulse: 77  Weight: 80.7 kg  Height: 5\' 2"  (1.575 m)   Wt Readings from Last 3 Encounters:  10/20/20 80.7 kg  10/14/20 81 kg  10/06/20 82.3 kg    Labs: Lab Results  Component Value Date   NA 141 10/20/2020   K 2.4 (LL) 10/20/2020   CL 111 10/20/2020   CO2 23 10/20/2020   GLUCOSE 111 (H) 10/20/2020   BUN 7 (L) 10/20/2020   CREATININE 1.28 (H) 10/20/2020   CALCIUM 8.8 (L) 10/20/2020   PHOS 3.6 07/07/2020   MG 2.1 07/07/2020   Lab Results  Component Value Date   INR 1.1 06/30/2020  Lab Results  Component Value Date   CHOL 143 07/01/2020   HDL 41 07/01/2020   LDLCALC 68 07/01/2020   TRIG 172 (H) 07/01/2020   TRIG 164 (H) 07/01/2020     GEN- The patient is well appearing, alert and oriented x 3 today.   Head- normocephalic, atraumatic Eyes-  Sclera clear, conjunctiva pink Ears- hearing intact Oropharynx- clear Neck- supple, no JVP Lymph- no cervical lymphadenopathy Lungs- Clear to ausculation bilaterally, normal work of breathing Heart- Regular rate and rhythm, no murmurs, rubs or gallops, PMI not laterally displaced GI- soft, NT, ND, + BS Extremities- no clubbing, cyanosis, or edema MS- no significant deformity or atrophy Skin- no rash or lesion Psych- euthymic mood, full affect Neuro- strength and sensation are intact  EKG-NSR at 77 bpm, pr int 156 ms, qrs int 82 ms, qtc 454 ms  Device report reviewed that showed frequent afib episodes  Epic records reviewed    Assessment and Plan: 1.  New onset afib S/p stroke in 2022 with Linq implanted and showed paroxsymal  afib She is in SR today and has not noted any heart racing  For now will hold off on rate control   Continue Linq monitoring   2. CHA2DS2VASc score of 6 I discussed with Dr.  Leonie Man needing  to add DOAC with afib being seen on LinQ He said that she could stop ASA but to continue brilinta for MCA stenting with resultant TICI 2C revascularization I added  eliquis 5 mg bid  I discussed in detail bleeding risks with both drugs  and to be very observant  for any signs of bleeding and report to PCP if occurs If falls and hits head will need to report to the ER  Cbc/ bmet today    Addendum- Bmet today showed critical K+ at 2.8. Daughter was notified and will see that her mother increases her K+ to bid. I will order bmet to be repeated next week. She is not on diuretic nor has has ongoing loose stools.   afib clinic as needed per device clinic  [place]. Kylin Dubs, [facility] [address], [place] [address] [phone]

## 2020-10-24 ENCOUNTER — Other Ambulatory Visit (HOSPITAL_COMMUNITY): Payer: Self-pay | Admitting: *Deleted

## 2020-10-24 ENCOUNTER — Other Ambulatory Visit: Payer: Self-pay

## 2020-10-24 ENCOUNTER — Ambulatory Visit (HOSPITAL_COMMUNITY)
Admission: RE | Admit: 2020-10-24 | Discharge: 2020-10-24 | Disposition: A | Discharge status: Home / Self Care | Payer: Medicare Other | Source: Ambulatory Visit | Attending: Physician Assistant | Admitting: Physician Assistant

## 2020-10-24 DIAGNOSIS — I480 Paroxysmal atrial fibrillation: Secondary | ICD-10-CM | POA: Insufficient documentation

## 2020-10-24 LAB — BASIC METABOLIC PANEL
Anion gap: 9 (ref 5–15)
BUN: 6 mg/dL — ABNORMAL LOW (ref 8–23)
CO2: 22 mmol/L (ref 22–32)
Calcium: 8.9 mg/dL (ref 8.9–10.3)
Chloride: 109 mmol/L (ref 98–111)
Creatinine, Ser: 1.18 mg/dL — ABNORMAL HIGH (ref 0.44–1.00)
GFR, Estimated: 51 mL/min — ABNORMAL LOW (ref >60)
Glucose, Bld: 94 mg/dL (ref 70–99)
Potassium: 3.2 mmol/L — ABNORMAL LOW (ref 3.5–5.1)
Sodium: 140 mmol/L (ref 135–145)

## 2020-10-24 MED ORDER — POTASSIUM CHLORIDE CRYS ER 20 MEQ PO TBCR: 20.0000 meq | EXTENDED_RELEASE_TABLET | Freq: Two times a day (BID) | ORAL | 3 refills | Status: DC

## 2020-10-25 ENCOUNTER — Telehealth (HOSPITAL_COMMUNITY): Payer: Self-pay

## 2020-10-25 NOTE — Telephone Encounter (Signed)
Called to schedule diagnostic angiogram, no answer, left vm. AW  

## 2020-10-28 ENCOUNTER — Telehealth: Payer: Self-pay | Admitting: Cardiology

## 2020-10-28 NOTE — Telephone Encounter (Signed)
Pt c/o medication issue:  1. Name of Medication:ticagrelor (BRILINTA) 90 MG TABS tablet apixaban (ELIQUIS) 5 MG TABS tablet  2. How are you currently taking this medication (dosage and times per day)? As directed  3. Are you having a reaction (difficulty breathing--STAT)? No  4. What is your medication issue? Pt is having bruising on both legs since she's been taking these 2 meds. Daughter [name] wants to know should she be concerned about it or not. Please advise

## 2020-10-28 NOTE — Telephone Encounter (Signed)
Pt also called to [facility] - reassured pt daughter Courtney Burke. Described bruising to both lower extremities and she was concerned. Pt denies swelling. Reassured bruising is to be expected on dual therapy and will cause increased bruising/increased bleeding risk per Roderic Palau NP.  Encouraged daughter to reach out to neurology to see length of therapy on brilinta as Eliquis would be lifelong. Daughter appreciative of advice/reassurance. Will call if other questions arise.

## 2020-11-03 NOTE — Progress Notes (Signed)
Carelink Summary Report / Loop Recorder 

## 2020-11-14 ENCOUNTER — Ambulatory Visit (INDEPENDENT_AMBULATORY_CARE_PROVIDER_SITE_OTHER): Payer: Medicare Other

## 2020-11-14 DIAGNOSIS — I639 Cerebral infarction, unspecified: Secondary | ICD-10-CM | POA: Diagnosis not present

## 2020-11-17 LAB — CUP PACEART REMOTE DEVICE CHECK
Date Time Interrogation Session: 20220613110948
Implantable Pulse Generator Implant Date: 20220407

## 2020-12-06 NOTE — Progress Notes (Signed)
Carelink Summary Report / Loop Recorder 

## 2020-12-14 ENCOUNTER — Other Ambulatory Visit (HOSPITAL_COMMUNITY): Payer: Self-pay | Admitting: Nurse Practitioner

## 2020-12-14 MED ORDER — POTASSIUM CHLORIDE CRYS ER 20 MEQ PO TBCR: 20.0000 meq | EXTENDED_RELEASE_TABLET | Freq: Two times a day (BID) | ORAL | 2 refills | Status: DC

## 2020-12-19 ENCOUNTER — Ambulatory Visit (INDEPENDENT_AMBULATORY_CARE_PROVIDER_SITE_OTHER): Payer: Medicare Other

## 2020-12-19 DIAGNOSIS — I639 Cerebral infarction, unspecified: Secondary | ICD-10-CM

## 2020-12-20 LAB — CUP PACEART REMOTE DEVICE CHECK
Date Time Interrogation Session: 20220716110802
Implantable Pulse Generator Implant Date: 20220407

## 2021-01-04 ENCOUNTER — Ambulatory Visit (INDEPENDENT_AMBULATORY_CARE_PROVIDER_SITE_OTHER): Payer: Medicare Other | Admitting: Adult Health

## 2021-01-04 ENCOUNTER — Encounter: Payer: Self-pay | Admitting: Adult Health

## 2021-01-04 VITALS — BP 136/82 | HR 60 | Ht 62.0 in | Wt 166.0 lb

## 2021-01-04 DIAGNOSIS — R269 Unspecified abnormalities of gait and mobility: Secondary | ICD-10-CM

## 2021-01-04 DIAGNOSIS — I63512 Cerebral infarction due to unspecified occlusion or stenosis of left middle cerebral artery: Secondary | ICD-10-CM | POA: Diagnosis not present

## 2021-01-04 DIAGNOSIS — I69320 Aphasia following cerebral infarction: Secondary | ICD-10-CM | POA: Diagnosis not present

## 2021-01-04 DIAGNOSIS — I69398 Other sequelae of cerebral infarction: Secondary | ICD-10-CM

## 2021-01-04 DIAGNOSIS — I480 Paroxysmal atrial fibrillation: Secondary | ICD-10-CM

## 2021-01-04 NOTE — Progress Notes (Signed)
[facility] [address] [place]. [place] [address] [phone]       OFFICE FOLLOW-UP NOTE  Courtney Burke Date of Birth:  09/09/1955 Medical Record Number:  [id number]    Reason for visit: Stroke follow-up Primary neurologist: Dr. Leonie Man  Chief Complaint  Patient presents with   Follow-up    RM 3 with brother Courtney Burke Pt Is well and stable, things are getting better for her per brother.  No new complications      HPI:   Today, 01/04/2021, [name] returns for 66-monthstroke follow-up accompanied by her brother, JMerry Burke  Working with SLP for residual aphasia but notes ongoing improvement. Gait has also been improving working with PT - uses cane - did have a fall last month but thankfully without injury.  Denies new stroke/[name] symptoms. ILR showed evidence of atrial fibrillation on 10/05/2020 -Eliquis initiated in addition to BNeligh ASA d/c'd.  She does have mild bruising but otherwise tolerating.  Has not yet scheduled diagnostic angiogram with Dr. DEstanislado Pandy  Compliant on atorvastatin without associated side effects.  Blood pressure 136/82. Monitors at home and typically stable 120s/70s.  No further concerns at this time.    History provided for reference purposes only Initial visit 10/05/2020 Dr. SLeonie Man [name] a 65year old African-American lady seen today for initial office follow-up visit following hospital admission for stroke in January 2022.  She is accompanied by her daughter.  History is obtained from them and review of hospital electronic medical records and I personally reviewed imaging films in PACS.  She is a 65year old pleasant African-American lady with past medical history of diabetes, hypertension, chronic kidney disease stage IIIa, dysphagia, obesity, s/p gastric sleeve surgery in December 2021.  She was brought in as a code stroke by EMS after she developed altered mental status.  She had a witnessed episodes in which her eyes rolled back  in her head twice with brief loss of consciousness.  She is subsequently found to have right-sided weakness.  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  Aspect score was 6 on admission.  Infarct core was 11 to 20 cc only.  After discussion of risk benefits with the patient's family patient was taken for emergent mechanical thrombectomy which was performed by Dr. DEstanislado Pandybut there was underlying MCA stenosis requiring rescue MCA stenting with resultant TICI 2C revascularization.  She was admitted admitted to the intensive care unit blood pressure tightly controlled.  She was subsequently extubated and found to have global aphasia and right hemiparesis.  MRI scan showed acute infarct involving left MCA territory of basal ganglia as well as left patchy temporal frontal and parietal lobes.  There is small area of hemorrhage noted in the left posterior temporal lobe.  2D echo showed ejection fraction of 6065%.  Lower extremity venous Dopplers negative for DVT.  LDL cholesterol 68 mg percent.  Hemoglobin A1c was 5.4.  Patient was started on aspirin and Brilinta for MCA stent.  History of dysphagia and had a feeding tube inserted.  She was transferred to inpatient rehab where she stayed for few weeks.  She also started on Keppra for seizure prophylaxis due to her episodes of seizure-like presentation.  EEG she had shown  intermittent rhythmic delta slowing in the left frontal region.  Patient has been at home since rehab.  She is to get getting physical occupational and speech therapy which is now down to 1 day a week.  She is able to speak better but still has to speak slowly and occasionally gets stuck and then some word finding difficulties.  She has noticed  improvement in right-sided strength and now she can walk independently but needs a walker.  She still has some dragging of the right foot.  Her balance is poor.  However she has had no falls or injuries.  She is able to manage most activities of daily living but needs a little supervision.  She is tolerating aspirin and Brilinta well with only minor bruising and no bleeding.  Blood pressures well controlled today it is elevated in the office at 153/75.  She remains on Lipitor which is tolerating well without muscle aches and pains.    ROS:   14 system review of systems is positive for those listed in HPI and all other systems negative  PMH:  Past Medical History:  Diagnosis Date   Acute ischemic left MCA stroke ([facility] [place]) 06/30/2020   Acute ischemic stroke (HCC)    Acute stroke due to occlusion of left middle cerebral artery ([place]) 06/30/2020   Age-related nuclear cataract of both eyes 09/28/2015   Arthralgia of multiple joints 09/28/2015   Asthma    Borderline diabetes    Cerebrovascular accident (CVA) ([place])    Chronic bilateral thoracic back pain 06/05/2018   Chronic renal insufficiency, stage 1 08/11/2014   Diabetes mellitus type 2 in obese ([place])    Dyslipidemia    Dysphagia, post-stroke    Erosive gastritis 07/20/2019   Formatting of this note might be different from the original. On EGD 07/2019   Esophagitis 07/20/2019   Formatting of this note might be different from the original. Added automatically from request for surgery [id number]  Formatting of this note might be different from the original. On EGD 07/2019   Essential hypertension 09/28/2015   Essential thrombocytosis ([place]) 09/28/2015   Gallstones 08/18/2019   Formatting of this note might be different from the original. 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   Hyperlipidemia, unspecified 03/16/2013   Hypertension     Keratoconjunctivitis sicca of both eyes not specified as Sjogren's 09/28/2015   Left middle cerebral artery stroke ([place]) 07/09/2020   Morbid obesity ([place]) 05/21/2013   Stage 3b chronic kidney disease ([place])    Status post gastric bypass for obesity 09/28/2015    Social History:  Social History   Socioeconomic History   Marital status: Divorced    Spouse name: Not on file   Number of children: Not on file   Years of education: Not on file   Highest education level: Not on file  Occupational History   Not on file  Tobacco Use   Smoking status: Never   Smokeless tobacco: Never  Vaping Use   Vaping Use: Never used  Substance and Sexual Activity   Alcohol use: No   Drug use: No   Sexual activity: Not on file  Other Topics Concern   Not on file  Social History Narrative   Lives with daughter [name]   Right Handed   Drinks no caffeine   Social Determinants of Health   Financial Resource Strain: Not on file  Food Insecurity: Not on  file  Transportation Needs: Not on file  Physical Activity: Not on file  Stress: Not on file  Social Connections: Not on file  Intimate Partner Violence: Not on file    Medications:   Current Outpatient Medications on File Prior to Visit  Medication Sig Dispense Refill   acetaminophen (TYLENOL) 325 MG tablet Take 2 tablets (650 mg total) by mouth every 6 (six) hours as needed for mild pain, fever or headache (or temp > 37.5 C (99.5 F)).     apixaban (ELIQUIS) 5 MG TABS tablet Take 1 tablet (5 mg total) by mouth 2 (two) times daily. 60 tablet 3   atorvastatin (LIPITOR) 80 MG tablet Take 1 tablet (80 mg total) by mouth at bedtime. 30 tablet 0   Multiple Vitamin (MULTIVITAMIN WITH MINERALS) TABS tablet Take 1 tablet by mouth daily.     omeprazole (PRILOSEC) 40 MG capsule Take 40 mg by mouth daily.     potassium chloride SA (KLOR-CON) 20 MEQ tablet Take 1 tablet (20 mEq total) by mouth 2 (two) times daily. 180 tablet 2   ticagrelor (BRILINTA) 90 MG  TABS tablet Take 1 tablet (90 mg total) by mouth 2 (two) times daily. 180 tablet 3   ursodiol (ACTIGALL) 250 MG tablet Take 250 mg by mouth 2 (two) times daily.     Vitamin D3 (VITAMIN D) 25 MCG tablet TAKE 1 TABLET (1,000 UNITS TOTAL) BY MOUTH DAILY BEFORE BREAKFAST. 60 tablet 0   No current facility-administered medications on file prior to visit.    Allergies:   Allergies  Allergen Reactions   Iodinated Diagnostic Agents Itching   Doxycycline Nausea And Vomiting   Sulfa Antibiotics Rash and Hives   Codeine Hives and Swelling    Swollen tongue   Hydrocodone Bit-Homatrop Mbr Itching   Ace Inhibitors Cough, Itching and Rash    Physical Exam Today's Vitals   01/04/21 1051  BP: (!) 159/85  Pulse: 60  Weight: 166 lb (75.3 kg)  Height: '5\' 2"'$  (1.575 m)   Body mass index is 30.36 kg/m.   General: well developed, well nourished very pleasant middle-aged African-American lady, seated, in no evident distress Head: head normocephalic and atraumatic.  Neck: supple with no carotid or supraclavicular bruits Cardiovascular: regular rate and rhythm, no murmurs Musculoskeletal: no deformity Skin:  no rash/petichiae Vascular:  Normal pulses all extremities  Neurologic Exam Mental Status: Awake and fully alert.  Moderate expressive > receptive aphasia.  Oriented to place and person.  Recent and remote memory diminished. Attention span, concentration and fund of knowledge impaired. Mood and affect appropriate.   Cranial Nerves: Pupils equal, briskly reactive to light. Extraocular movements full without nystagmus. Visual fields full to confrontation. Hearing intact. Facial sensation intact.  Mild right lower facial asymmetry., tongue, palate moves normally and symmetrically.  Motor: Normal bulk and tone. Normal strength in all tested extremity muscles.  Diminished fine finger movements on the right.  Orbits left over right upper extremity.  Trace right grip and ankle dorsiflexor  weakness Sensory.: intact to touch ,pinprick .position and vibratory sensation.  Coordination: Rapid alternating movements normal in all extremities except decreased right hand. Finger-to-nose and heel-to-shin performed accurately bilaterally. Gait and Station: Arises from chair without difficulty. Stance is normal. Gait demonstrates normal stride length with mild right leg stiffness and slightly decreased step height with use of cane.  Tandem walk and heel toe not attempted. Reflexes: 1+ and symmetric. Toes downgoing.       ASSESSMENT: 65 year old lady with left  MCA infarct due to left MCA occlusion status post mechanical thrombectomy with left MCA rescue stenting in January 2022 with residual aphasia and mild right hemiparesis and gait abnormality.  S/p ILR which showed evidence of A. fib on 10/05/2020.  Vascular risk factors of diabetes, hypertension, obesity and intracranial stenosis.       PLAN: -new dx of Afib -continue Eliquis 5 mg twice daily and routine follow-up with cardiology -s/p MCA stent -continue Brilinta until follow-up with IR Dr. Estanislado Pandy - needs to be scheduled for cerebral angiogram - will reach out to IR schedulers for further assistance -Aphasia-continue working with SLP for hopeful further recovery -Gait impairment -continue working with PT and use of cane at all times unless otherwise instructed -maintain strict control of hypertension with blood pressure goal below 130/90 and lipids with LDL cholesterol goal below 70 mg/dL.    Follow-up in 4 months or call earlier if needed   CC:  [facility] provider: Dr. Marcene Corning, Moss Mc, PA-C   I spent 33 minutes of face-to-face and non-face-to-face time with patient and father.  This included previsit chart review, lab review, study review, electronic health record documentation, patient education and discussion regarding prior stroke, secondary stroke prevention measures and aggressive stroke risk factor management,  new diagnosis of A. fib and use of Eliquis, indication for IR follow-up and use of Brilinta, residual deficits and answered all other questions to patient and brother satisfaction  Frann Rider, AGNP-BC  [facility] [address] [place], [place] [address]  Phone [phone] Fax [phone] Note: This document was prepared with digital dictation and possible smart phrase technology. Any transcriptional errors that result from this process are unintentional.

## 2021-01-04 NOTE — Patient Instructions (Signed)
Continue working with speech and physical therapy for likely ongoing recovery  Will reach out to Dr. Estanislado Pandy to further assist with scheduling follow-up visit and monitoring of your stent -he will also further determine ongoing need of Brilinta  Continue Eliquis (apixaban) daily  and atorvastatin  for secondary stroke prevention  Continue to follow up with PCP regarding cholesterol and blood pressure management  Maintain strict control of hypertension with blood pressure goal below 130/90 and cholesterol with LDL cholesterol (bad cholesterol) goal below 70 mg/dL.       Followup in the future with me in 4 months or call earlier if needed       Thank you for coming to see [place] at [facility]. I hope we have been able to provide you high quality care today.  You may receive a patient satisfaction survey over the next few weeks. We would appreciate your feedback and comments so that we may continue to improve ourselves and the health of our patients.

## 2021-01-05 NOTE — Progress Notes (Signed)
I agree with the above plan 

## 2021-01-10 NOTE — Progress Notes (Signed)
Carelink Summary Report / Loop Recorder 

## 2021-01-11 ENCOUNTER — Other Ambulatory Visit (HOSPITAL_COMMUNITY): Payer: Self-pay | Admitting: Interventional Radiology

## 2021-01-11 DIAGNOSIS — I639 Cerebral infarction, unspecified: Secondary | ICD-10-CM

## 2021-01-12 ENCOUNTER — Encounter: Payer: Medicare Other | Attending: Physical Medicine & Rehabilitation | Admitting: Physical Medicine & Rehabilitation

## 2021-01-12 ENCOUNTER — Other Ambulatory Visit: Payer: Self-pay

## 2021-01-12 ENCOUNTER — Other Ambulatory Visit (HOSPITAL_COMMUNITY): Payer: Self-pay | Admitting: Nurse Practitioner

## 2021-01-12 ENCOUNTER — Encounter: Payer: Self-pay | Admitting: Physical Medicine & Rehabilitation

## 2021-01-12 VITALS — BP 155/75 | HR 67 | Temp 98.2°F | Ht 62.0 in | Wt 165.6 lb

## 2021-01-12 DIAGNOSIS — R269 Unspecified abnormalities of gait and mobility: Secondary | ICD-10-CM

## 2021-01-12 DIAGNOSIS — I69398 Other sequelae of cerebral infarction: Secondary | ICD-10-CM | POA: Diagnosis present

## 2021-01-12 DIAGNOSIS — I69320 Aphasia following cerebral infarction: Secondary | ICD-10-CM | POA: Insufficient documentation

## 2021-01-12 NOTE — Progress Notes (Signed)
Subjective:    Patient ID: Courtney Burke, female    DOB: [birth year], 65 y.o.   MRN: [id number] . right-handed female with history of hypertension, diet-controlled diabetes mellitus, CKD stage III, morbid obesity gastric sleeve 05/24/2020.  Presented 06/30/2020 with altered mental status and aphasia.  Cranial CT scan showed acute left MCA territory infarction.  No hemorrhage or mass-effect.  Patient did not receive TPA.  EEG negative for seizures.  CT angiogram of head and neck positive for emergent large vessel occlusion.  Left MCA origin.  Some left MCA branch reconstitution underwent MCA stenting per interventional radiology.  Follow-up MRI showed acute infarct left MCA territory involving the basal ganglia as well as left temporal frontal parietal lobes.  Small amount of hemorrhage in the left posterior temporal lobe.  Additional small areas of acute infarction in the occipital white matter bilaterally and right cerebellum.  Echocardiogram with ejection fraction of 60 to 65%.  No wall motion abnormalities grade 1 diastolic dysfunction.  Maintained on aspirin 81 mg daily as well as Brilinta for CVA prophylaxis.  Patient follow-up outpatient cardiology service to discuss loop recorder placement.  Subcutaneous Lovenox for DVT prophylaxis.  Maintained on Keppra for seizure prophylaxis.  Hospital course complicated by post stroke dysphagia currently on a dysphagia #2 nectar thick liquid diet with alternative means of nutritional support.  Bouts of urinary retention placed on Urecholine.  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 She continues to receive outpatient PT and speech.  Unfortunately the speech therapist at the clinic is now weaning and brother is looking for another clinic that would be  closer to home and have both physical therapy and speech therapy. The patient has had follow-up with her bariatric surgeon, Dr. Evorn Gong last month.  A few days ago the patient complains of seeing blood in her stool  on 2 occasions.  She had some abdominal pain as well at that time.  It is difficult to get a good history given her significant aphasia.  We discussed with the patient and her brother the need to contact Dr. Evorn Gong.  The patient remains on anticoagulant, Eliquis. Chronic left ankle pain history of fracture and ankle fusion. Pain Inventory Average Pain 0 Pain Right Now 0 My pain is left middle finger & left ankle hurts  LOCATION OF PAIN  left middle finger & left ankle hurts  BOWEL Number of stools per week: 7 -10 (blood in stool) Oral laxative use No  Type of laxative none Enema or suppository use No  History of colostomy No  Incontinent No   BLADDER Normal In and out cath, frequency none Able to self cath No  Bladder incontinence No  Frequent urination No  Leakage with coughing No  Difficulty starting stream No  Incomplete bladder emptying No    Mobility walk without assistance use a cane ability to climb steps?  yes do you drive?  no  Function retired I need assistance with the following:  meal prep, household duties, and shopping Do you have any goals in this area?  yes  Neuro/Psych bowel control problems weakness trouble walking  Prior Studies Any changes since last visit?  no  Physicians involved in your care Any changes since last visit?  no   Family History  Problem Relation Age of Onset   Stroke Maternal Grandfather  Social History   Socioeconomic History   Marital status: Divorced    Spouse name: Not on file   Number of children: Not on file   Years of education: Not on file   Highest education level: Not on file  Occupational History   Not on file  Tobacco Use   Smoking status: Never   Smokeless tobacco: Never  Vaping Use    Vaping Use: Never used  Substance and Sexual Activity   Alcohol use: No   Drug use: No   Sexual activity: Not on file  Other Topics Concern   Not on file  Social History Narrative   Lives with daughter [name]   Right Handed   Drinks no caffeine   Social Determinants of Health   Financial Resource Strain: Not on file  Food Insecurity: Not on file  Transportation Needs: Not on file  Physical Activity: Not on file  Stress: Not on file  Social Connections: Not on file   Past Surgical History:  Procedure Laterality Date   ANKLE SURGERY     CARPAL TUNNEL RELEASE     carpel tunnel     IR CT HEAD LTD  06/30/2020   IR INTRA CRAN STENT  06/30/2020   IR PERCUTANEOUS ART THROMBECTOMY/INFUSION INTRACRANIAL INC DIAG ANGIO  06/30/2020   IR RADIOLOGIST EVAL & MGMT  08/26/2020   RADIOLOGY WITH ANESTHESIA N/A 06/30/2020   Procedure: RADIOLOGY WITH ANESTHESIA;  Surgeon: Radiologist, Medication, MD;  Location: [facility];  Service: Radiology;  Laterality: N/A;   Past Medical History:  Diagnosis Date   Acute ischemic left MCA stroke ([place]) 06/30/2020   Acute ischemic stroke (HCC)    Acute stroke due to occlusion of left middle cerebral artery ([place]) 06/30/2020   Age-related nuclear cataract of both eyes 09/28/2015   Arthralgia of multiple joints 09/28/2015   Asthma    Borderline diabetes    Cerebrovascular accident (CVA) ([place])    Chronic bilateral thoracic back pain 06/05/2018   Chronic renal insufficiency, stage 1 08/11/2014   Diabetes mellitus type 2 in obese ([place])    Dyslipidemia    Dysphagia, post-stroke    Erosive gastritis 07/20/2019   Formatting of this note might be different from the original. On EGD 07/2019   Esophagitis 07/20/2019   Formatting of this note might be different from the original. Added automatically from request for surgery [id number]  Formatting of this note might be different from the original. On EGD 07/2019   Essential hypertension 09/28/2015   Essential thrombocytosis ([place])  09/28/2015   Gallstones 08/18/2019   Formatting of this note might be different from the original. 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   Hyperlipidemia, unspecified 03/16/2013   Hypertension    Keratoconjunctivitis sicca of both eyes not specified as Sjogren's 09/28/2015   Left middle cerebral artery stroke ([place]) 07/09/2020   Morbid obesity ([place]) 05/21/2013   Stage 3b chronic kidney disease ([place])    Status post gastric bypass for obesity 09/28/2015   LMP 09/28/2012   Opioid Risk Score:   Fall Risk Score:  `1  Depression screen PHQ 2/9  Depression screen [facility] 2/9 09/02/2020 08/04/2020  Decreased Interest 0 0  Down, Depressed, Hopeless 0 1  PHQ - 2 Score 0 1  Altered sleeping - 0  Tired, decreased energy - 0  Change in appetite - 0  Feeling bad or failure about yourself  - 1  Trouble concentrating - 0  Moving slowly or fidgety/restless - 1  Suicidal thoughts - 0  PHQ-9 Score - 3    Review of Systems  Gastrointestinal:  Negative for diarrhea.       Blood in stool  Musculoskeletal:  Positive for gait problem.  Neurological:  Positive for weakness.  All other systems reviewed and are negative.     Objective:   Physical Exam Vitals and nursing note reviewed.  Constitutional:      Appearance: She is obese.  HENT:     Head: Normocephalic and atraumatic.  Eyes:     Extraocular Movements: Extraocular movements intact.     Conjunctiva/sclera: Conjunctivae normal.     Pupils: Pupils are equal, round, and reactive to light.  Musculoskeletal:     Comments: Left ankle with effusion.  Unable to dorsiflex plantarflex invert or depart. No limitation in upper extremity range of motion no limitation in bilateral knee range of motion or right ankle. No pain with range of motion of the upper limbs.  Skin:    General: Skin is warm and dry.  Neurological:     Mental  Status: She is alert and oriented to person, place, and time.     Comments: Ambulates with a quad cane no evidence of  Or knee instability no follow-up right knee with area.  This is due to her ankle fusion.  Speech fluent aphasia poor comprehension is able to follow simple commands with gestural cues  Psychiatric:        Mood and Affect: Mood normal.        Behavior: Behavior normal.          Assessment & Plan:  1.  Left posterior division of MCA infarct with with aphasia as well as apraxia but no significant hemiparesis.  She does have a gait disorder which is partially due to ankle fusion but also some balance issues poststroke.  Continue outpatient PT and speech.  No upper extremity issues other than apraxia. Return to clinic in 6 months 2.  Blood in stool per patient has occurred on 2 occasions fairly recently, instructed to call her bariatric surgeon Dr. Evorn Gong.  Patient is on anticoagulants.

## 2021-01-13 ENCOUNTER — Telehealth: Payer: Self-pay | Admitting: Student

## 2021-01-13 ENCOUNTER — Other Ambulatory Visit (HOSPITAL_COMMUNITY): Payer: Self-pay | Admitting: Student

## 2021-01-13 MED ORDER — DIPHENHYDRAMINE HCL 50 MG PO TABS: 50.0000 mg | ORAL_TABLET | Freq: Every evening | ORAL | 0 refills | Status: DC | PRN

## 2021-01-13 MED ORDER — PREDNISONE 50 MG PO TABS: ORAL_TABLET | ORAL | 0 refills | Status: DC

## 2021-01-13 NOTE — Telephone Encounter (Signed)
Patient is scheduled for diagnostic cerebral angiogram with Dr. Estanislado Pandy on Tuesday 01/17/2021.  Chart review revealed the patient is allergic to contrast media. Allergy perp Premedication including prednisone and Benadryl sent to [facility] pharmacy.  Patient to discontinue her Eliquis 2 days prior to the procedure per Dr. Estanislado Pandy. Ms. Fazzino was called and informed about the premedication being sent to the pharmacy and the timing of discontinuation of Eliquis, asked to stop taking Eliquis after Saturday.  Patient verbalized understanding.  Please call [facility] IR for further question and concerns.  Armando Gang Jove Beyl PA-C 01/13/2021 4:25 PM

## 2021-01-16 ENCOUNTER — Other Ambulatory Visit: Payer: Self-pay | Admitting: Internal Medicine

## 2021-01-17 ENCOUNTER — Other Ambulatory Visit: Payer: Self-pay

## 2021-01-17 ENCOUNTER — Other Ambulatory Visit (HOSPITAL_COMMUNITY): Payer: Self-pay | Admitting: Interventional Radiology

## 2021-01-17 ENCOUNTER — Encounter (HOSPITAL_COMMUNITY): Payer: Self-pay

## 2021-01-17 ENCOUNTER — Ambulatory Visit (HOSPITAL_COMMUNITY)
Admission: RE | Admit: 2021-01-17 | Discharge: 2021-01-17 | Disposition: A | Discharge status: Home / Self Care | Payer: Medicare Other | Source: Ambulatory Visit | Attending: Interventional Radiology | Admitting: Interventional Radiology

## 2021-01-17 DIAGNOSIS — Z881 Allergy status to other antibiotic agents status: Secondary | ICD-10-CM | POA: Diagnosis not present

## 2021-01-17 DIAGNOSIS — I639 Cerebral infarction, unspecified: Secondary | ICD-10-CM

## 2021-01-17 DIAGNOSIS — Z882 Allergy status to sulfonamides status: Secondary | ICD-10-CM | POA: Insufficient documentation

## 2021-01-17 DIAGNOSIS — Z7902 Long term (current) use of antithrombotics/antiplatelets: Secondary | ICD-10-CM | POA: Diagnosis not present

## 2021-01-17 DIAGNOSIS — Z91041 Radiographic dye allergy status: Secondary | ICD-10-CM | POA: Diagnosis not present

## 2021-01-17 DIAGNOSIS — Z8673 Personal history of transient ischemic attack (TIA), and cerebral infarction without residual deficits: Secondary | ICD-10-CM | POA: Insufficient documentation

## 2021-01-17 DIAGNOSIS — Z79899 Other long term (current) drug therapy: Secondary | ICD-10-CM | POA: Diagnosis not present

## 2021-01-17 DIAGNOSIS — Z7901 Long term (current) use of anticoagulants: Secondary | ICD-10-CM | POA: Diagnosis not present

## 2021-01-17 DIAGNOSIS — Z823 Family history of stroke: Secondary | ICD-10-CM | POA: Diagnosis not present

## 2021-01-17 DIAGNOSIS — Z885 Allergy status to narcotic agent status: Secondary | ICD-10-CM | POA: Diagnosis not present

## 2021-01-17 HISTORY — PX: IR ANGIO INTRA EXTRACRAN SEL COM CAROTID INNOMINATE BILAT MOD SED: IMG5360

## 2021-01-17 HISTORY — PX: IR US GUIDE VASC ACCESS RIGHT: IMG2390

## 2021-01-17 HISTORY — PX: IR ANGIO VERTEBRAL SEL SUBCLAVIAN INNOMINATE UNI R MOD SED: IMG5365

## 2021-01-17 LAB — CBC
HCT: 35.8 % — ABNORMAL LOW (ref 36.0–46.0)
Hemoglobin: 12.3 g/dL (ref 12.0–15.0)
MCH: 28.7 pg (ref 26.0–34.0)
MCHC: 34.4 g/dL (ref 30.0–36.0)
MCV: 83.4 fL (ref 80.0–100.0)
Platelets: 415 10*3/uL — ABNORMAL HIGH (ref 150–400)
RBC: 4.29 MIL/uL (ref 3.87–5.11)
RDW: 14.1 % (ref 11.5–15.5)
WBC: 4 10*3/uL (ref 4.0–10.5)
nRBC: 0 % (ref 0.0–0.2)

## 2021-01-17 LAB — BASIC METABOLIC PANEL
Anion gap: 10 (ref 5–15)
BUN: 11 mg/dL (ref 8–23)
CO2: 24 mmol/L (ref 22–32)
Calcium: 9.4 mg/dL (ref 8.9–10.3)
Chloride: 107 mmol/L (ref 98–111)
Creatinine, Ser: 1.45 mg/dL — ABNORMAL HIGH (ref 0.44–1.00)
GFR, Estimated: 40 mL/min — ABNORMAL LOW (ref >60)
Glucose, Bld: 201 mg/dL — ABNORMAL HIGH (ref 70–99)
Potassium: 2.8 mmol/L — ABNORMAL LOW (ref 3.5–5.1)
Sodium: 141 mmol/L (ref 135–145)

## 2021-01-17 LAB — PROTIME-INR
INR: 1.2 (ref 0.8–1.2)
Prothrombin Time: 14.8 seconds (ref 11.4–15.2)

## 2021-01-17 IMAGING — XA IR ANGIO INTRA EXTRACRAN SEL COM CAROTID INNOMINATE BILAT MOD SE
1 of 2 series · 12 of 24 positions shown · IV contrast (IODINE)
Comparison: MRI of the brain, and MRA brain [DATE] and
diagnostic arteriogram [DATE].

CLINICAL DATA: History of left middle cerebral artery thrombectomy
with placement of left middle cerebral artery stent for MCA
occlusion approximately 7 months ago. Recent MRA is suggestive of
intra stent stenosis, and also of a right middle cerebral artery
bifurcation stenosis.

EXAM:
BILATERAL COMMON CAROTID AND INNOMINATE ANGIOGRAPHY
TECHNIQUE: Informed written consent was obtained from the patient after a
thorough discussion of the procedural risks, benefits and
alternatives. All questions were addressed. Maximal Sterile Barrier
Technique was utilized including caps, mask, sterile gowns, sterile
gloves, sterile drape, hand hygiene and skin antiseptic. A timeout
was performed prior to the initiation of the procedure.

[Series 300: ir angio intra extracran sel com carotid · 12 of 126 slices shown]
[im 1/126]
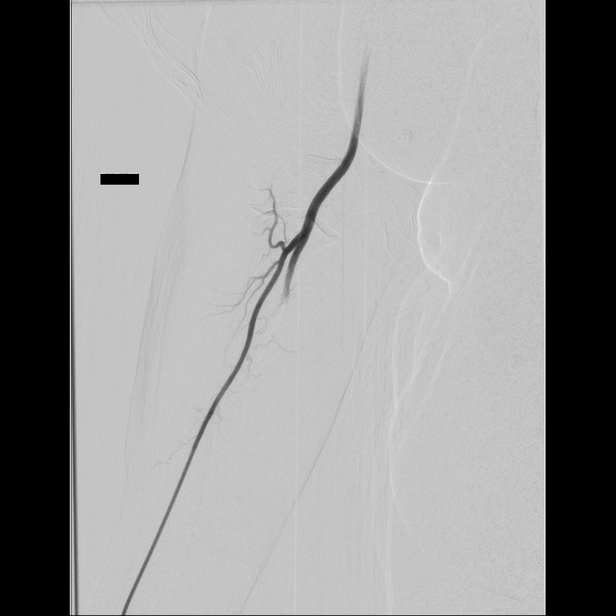
[im 12/126]
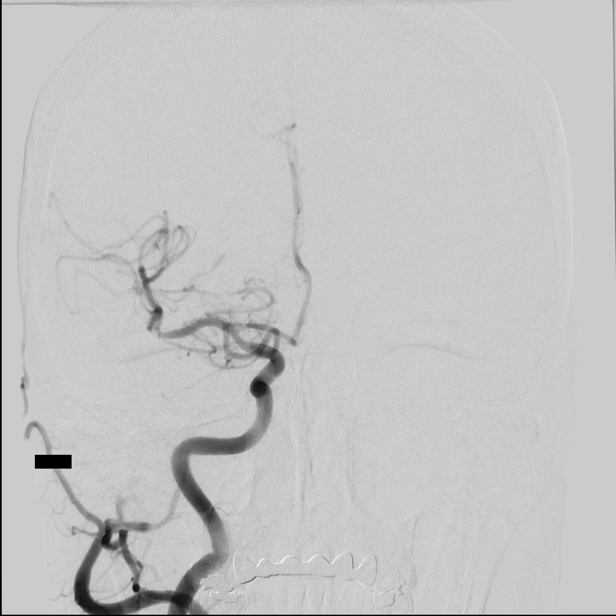
[im 23/126]
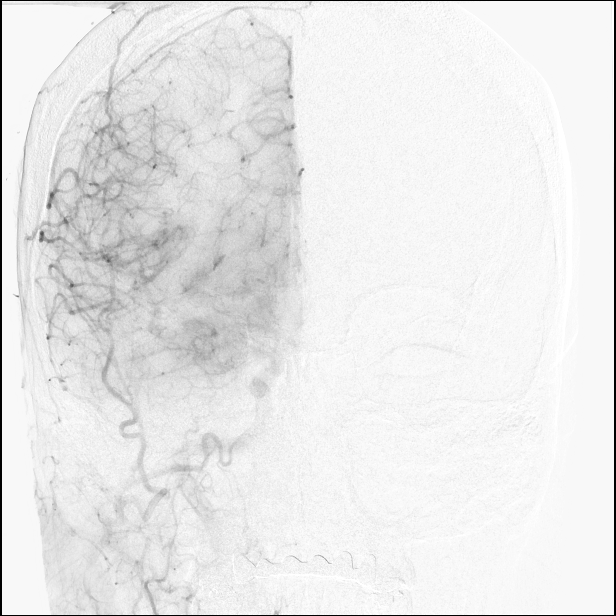
[im 35/126]
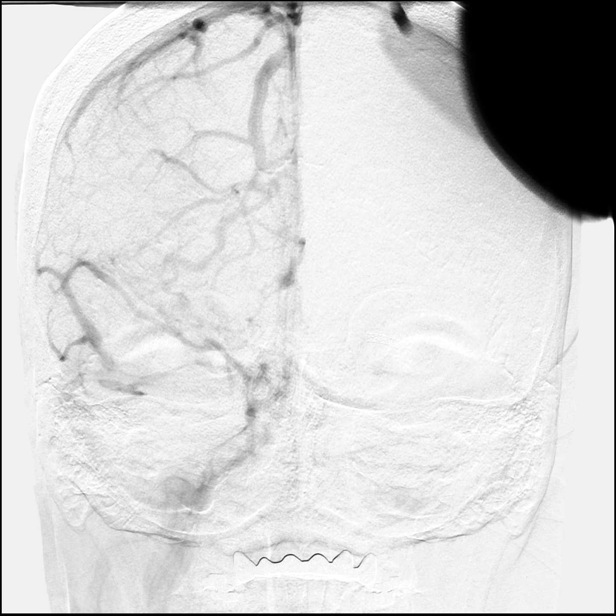
[im 46/126]
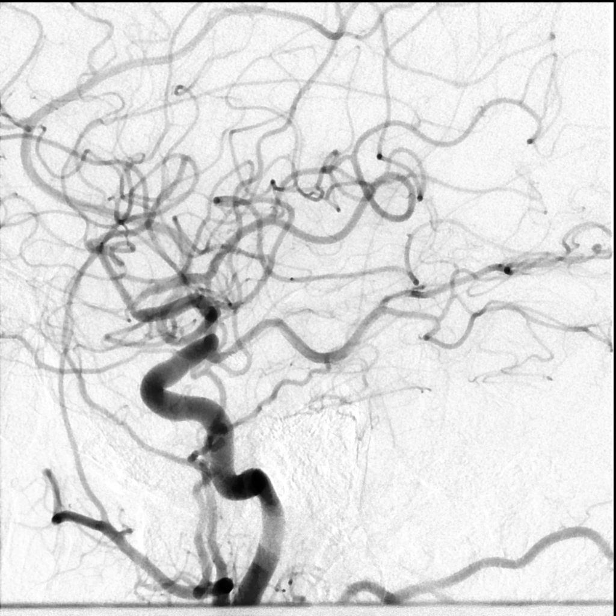
[im 57/126]
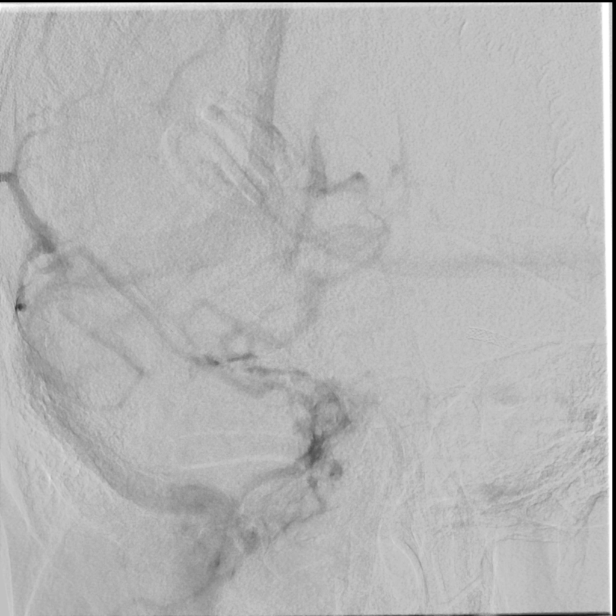
[im 69/126]
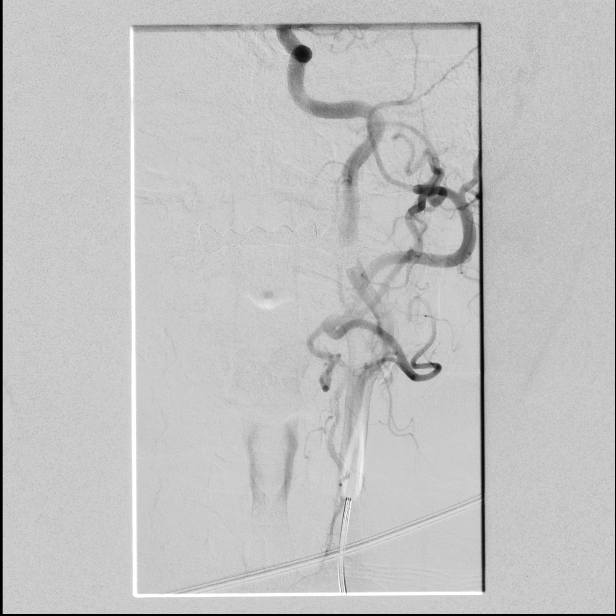
[im 80/126]
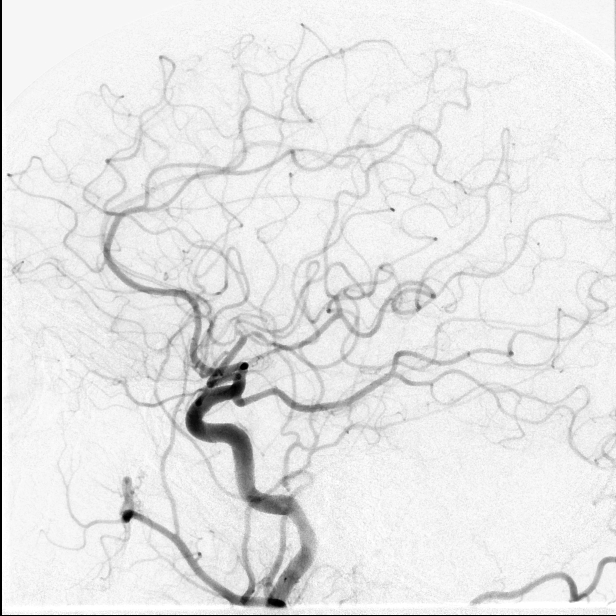
[im 91/126]
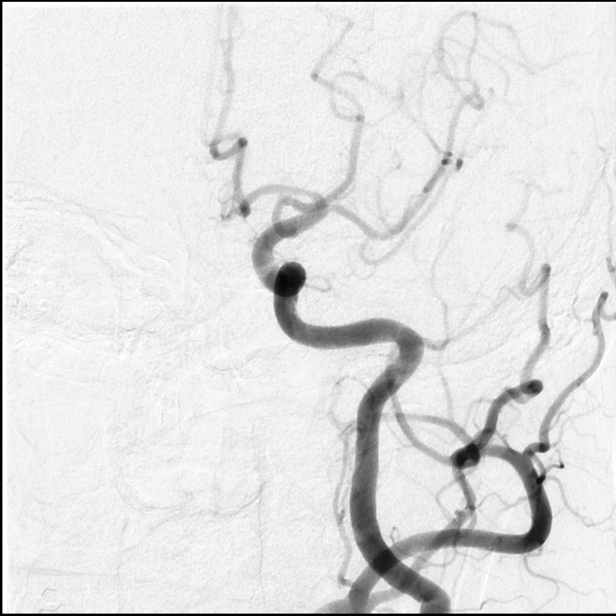
[im 103/126]
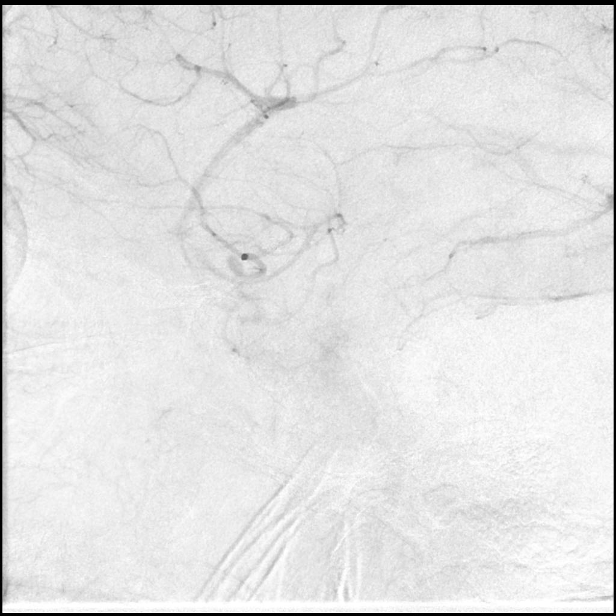
[im 114/126]
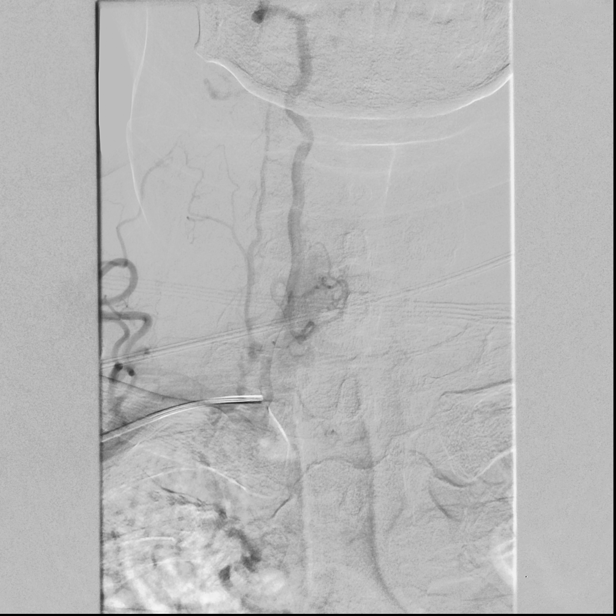
[im 126/126]
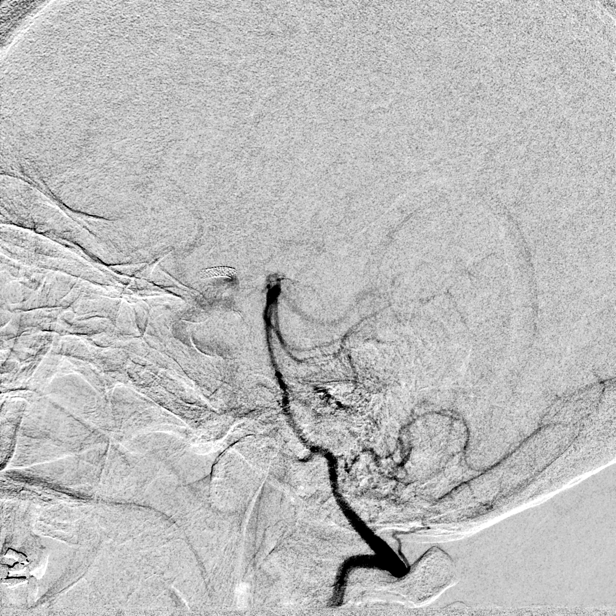

[12 of 24 positions shown; findings below may reference images not displayed]

MEDICATIONS:
Heparin [1N] units IV. None antibiotic was administered within 1
hour of the procedure.

ANESTHESIA/SEDATION:
Versed 1 mg IV; Fentanyl 25 mcg IV

Moderate Sedation Time:  41 minutes

The patient was continuously monitored during the procedure by the
interventional radiology nurse under my direct supervision.

CONTRAST:  Omnipaque 300 approximately 90 mL.

FLUOROSCOPY TIME:  Fluoroscopy Time: 16 minutes 48 seconds (586.5
mGy).

COMPLICATIONS:
None immediate.
The right forearm to the wrist was prepped and draped in the usual
sterile manner.

Right radial artery was identified with ultrasound, and its
morphology documented.

A dorsal palmar anastomosis was verified to be present. Using
ultrasound guidance access into the right radial artery was obtained
with a micropuncture set over a 0.018 inch micro guidewire. A [DATE]
French radial sheath was then inserted into the radial artery. The
micro guidewire and the obturator were removed. Good aspiration
obtained from the side port of the radial sheath. A cocktail of [1N]
units of heparin, 2.5 mg of verapamil, and 200 mcg of nitroglycerin
was then infused in diluted form through the sheath without event. A
right radial arteriogram was then performed. Over a 0.035 inch
Roadrunner guidewire, a 5 SEIJKENS 2 diagnostic catheter was
advanced to the aortic arch region, and selectively positioned in
the right vertebral artery, the right common carotid artery and the
left common carotid artery.

Following the procedure hemostasis at the right radial puncture site
was obtained with a wrist band. A distal right radial pulse was
verified to be present.
FINDINGS: The right common carotid arteriogram demonstrates the right external
carotid artery and its major branches to be widely patent.

The right internal carotid artery at the bulb to the cranial skull
base is widely patent.

The petrous and proximal cavernous segments are widely patent.

There is approximately 50% stenosis of the right internal carotid
artery supraclinoid segment. The right posterior communicating
artery is seen opacifying the right posterior cerebral artery
distribution.

The right middle cerebral artery and the right anterior cerebral
artery opacify into the capillary and venous phases.

Severe stenosis of the proximal inferior division of the right
middle cerebral artery is noted. Right anterior cerebral artery has
a mild stenosis proximally. Flow is seen distally into the right A2
segment and distally.

The left common carotid arteriogram demonstrates the left external
carotid artery and its major branches to be widely patent.

The left internal carotid artery at the bulb to the cranial skull
base is widely patent.

The petrous, cavernous and supraclinoid segments are widely patent.

The left posterior communicating artery is seen opacifying the left
posterior cerebral artery distribution.

The left anterior cerebral artery has approximately 50% stenosis in
its proximal A1 segment.

More distally, the vessel is seen to opacify to the cranial skull
base.

The left middle cerebral artery stented segment is widely patent.
There is a mild stenosis at the origin of the left middle cerebral
artery.

Also noted are mild-to-moderate focal areas of narrowing involving
the proximal aspects of the superior and inferior divisions of the
left middle cerebral artery.

Right vertebral origin is widely patent.

The vessel is seen to opacify to the cranial skull base. Patency is
maintained of the right posterior-inferior cerebellar artery and the
right vertebrobasilar junction.

The opacified portion of the basilar artery, the superior cerebellar
arteries and of the anterior-inferior cerebellar arteries
demonstrate patency into the capillary and venous phases.
IMPRESSION: Patent stented segment of the left middle cerebral artery M1
segment.

Mild stenosis at the origin of the left middle cerebral artery.

Severe stenosis involving the dominant inferior division of the left
middle cerebral artery.

Approximately 50% stenosis of the right internal carotid artery
supraclinoid segment.

PLAN:
Findings reviewed with the patient and the patient's daughter.

Given the above findings and the patient being asymptomatic,
continue with conservative management with Brilinta, and Eliquis for
now. Follow-up MRI of the brain and MRA of the brain in 6 months.

Should the patient develop stroke-like symptoms, the patient was
advised to call 911.

Patient and daughter leave with good understanding and agreement
with the above management plan.

## 2021-01-17 MED ORDER — NITROGLYCERIN 1 MG/10 ML FOR IR/CATH LAB
INTRA_ARTERIAL | Status: AC
  Filled 2021-01-17: qty 10

## 2021-01-17 MED ORDER — VERAPAMIL HCL 2.5 MG/ML IV SOLN
INTRAVENOUS | Status: AC
  Filled 2021-01-17: qty 2

## 2021-01-17 MED ORDER — IOHEXOL 300 MG/ML  SOLN
150.0000 mL | Freq: Once | INTRAMUSCULAR | Status: AC | PRN
  Administered 2021-01-17: 125 mL via INTRA_ARTERIAL

## 2021-01-17 MED ORDER — SODIUM CHLORIDE 0.9 % IV SOLN
INTRAVENOUS | Status: AC | PRN
  Administered 2021-01-17: 75 mL/h via INTRAVENOUS

## 2021-01-17 MED ORDER — FENTANYL CITRATE (PF) 100 MCG/2ML IJ SOLN
INTRAMUSCULAR | Status: AC | PRN
  Administered 2021-01-17: 25 ug via INTRAVENOUS

## 2021-01-17 MED ORDER — LIDOCAINE HCL 1 % IJ SOLN
INTRAMUSCULAR | Status: AC
  Filled 2021-01-17: qty 20

## 2021-01-17 MED ORDER — VERAPAMIL HCL 2.5 MG/ML IV SOLN: INTRA_ARTERIAL | Status: AC | PRN

## 2021-01-17 MED ORDER — HEPARIN SODIUM (PORCINE) 1000 UNIT/ML IJ SOLN
INTRAMUSCULAR | Status: AC
  Filled 2021-01-17: qty 1

## 2021-01-17 MED ORDER — POTASSIUM CHLORIDE CRYS ER 20 MEQ PO TBCR
20.0000 meq | EXTENDED_RELEASE_TABLET | Freq: Once | ORAL | Status: AC
  Administered 2021-01-17: 20 meq via ORAL

## 2021-01-17 MED ORDER — MIDAZOLAM HCL 2 MG/2ML IJ SOLN
INTRAMUSCULAR | Status: AC | PRN
  Administered 2021-01-17: 1 mg via INTRAVENOUS

## 2021-01-17 MED ORDER — SODIUM CHLORIDE 0.9 % IV SOLN: INTRAVENOUS | Status: AC

## 2021-01-17 MED ORDER — MIDAZOLAM HCL 2 MG/2ML IJ SOLN
INTRAMUSCULAR | Status: AC
  Filled 2021-01-17: qty 2

## 2021-01-17 MED ORDER — FENTANYL CITRATE (PF) 100 MCG/2ML IJ SOLN
INTRAMUSCULAR | Status: AC
  Filled 2021-01-17: qty 2

## 2021-01-17 MED ORDER — POTASSIUM CHLORIDE CRYS ER 20 MEQ PO TBCR
EXTENDED_RELEASE_TABLET | ORAL | Status: AC
  Filled 2021-01-17: qty 1

## 2021-01-17 MED ORDER — SODIUM CHLORIDE 0.9 % IV SOLN: INTRAVENOUS | Status: DC

## 2021-01-17 NOTE — Procedures (Signed)
S/P bilateral common carotid and RT vertebral arteriograms. RT rad approach. Findings. 1Widely patent prox Lt MCA stent with mild stenosis at the origin of MCA. 2.Severe stenosis of prox aspect of inf division of the RT MCA. S.Dveshwar MD

## 2021-01-17 NOTE — Progress Notes (Signed)
Pt's IV infiltrated upon arrival from radiology. Unable to give IV fluids. Pt had sufficient PO fluids prior to DC.

## 2021-01-17 NOTE — Progress Notes (Signed)
Pt ambulated without difficulty or bleeding.   Discharged home with her daughter who will drive and stay with pt x 24 hrs. 

## 2021-01-17 NOTE — Sedation Documentation (Signed)
ETC02 removed per Dr. Deveshwar  

## 2021-01-17 NOTE — H&P (Signed)
Chief Complaint: Patient was seen in consultation today for Cerebral arteriogram at the request of Dr Lavera Guise   Supervising Physician: Luanne Bras  Patient Status: [name] - Out-pt  History of Present Illness: Courtney Burke is a 65 y.o. female   Known to NIR CVA 06/2020 L MCA revascularization 06/30/20 IMPRESSION: Status post endovascular complete revascularization of occluded left middle cerebral artery M1 segment with 1 pass with the Tiger 17 retrieval device and proximal aspiration followed by placement of a 2.25 mm x 12 mm Synergy balloon mounted drug-eluting stent with achievement of a TICI 3 revascularization.  Follow up consult 08/23/20 Pt is doing so much better Using all 4s---still in Physical Therapy and Speech Therapy every week Speech much improved Still with some difficulty with her "thoughts" Her daughter is with her today  Scheduled today for follow up cerebral arteriogram Has stopped eliquis x 2 days Continues Brilinta daily Has been premedicated for contrast allergy with Prednisone and Bendryl  Past Medical History:  Diagnosis Date   Acute ischemic left MCA stroke ([place]) 06/30/2020   Acute ischemic stroke (HCC)    Acute stroke due to occlusion of left middle cerebral artery ([place]) 06/30/2020   Age-related nuclear cataract of both eyes 09/28/2015   Arthralgia of multiple joints 09/28/2015   Asthma    Borderline diabetes    Cerebrovascular accident (CVA) (Courtney Burke)    Chronic bilateral thoracic back pain 06/05/2018   Chronic renal insufficiency, stage 1 08/11/2014   Diabetes mellitus type 2 in obese ([name])    Dyslipidemia    Dysphagia, post-stroke    Erosive gastritis 07/20/2019   Formatting of this note might be different from the original. On EGD 07/2019   Esophagitis 07/20/2019   Formatting of this note might be different from the original. Added automatically from request for surgery [id number]  Formatting of this note might be different from the original. On  EGD 07/2019   Essential hypertension 09/28/2015   Essential thrombocytosis ([place]) 09/28/2015   Gallstones 08/18/2019   Formatting of this note might be different from the original. 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   Hyperlipidemia, unspecified 03/16/2013   Hypertension    Keratoconjunctivitis sicca of both eyes not specified as Sjogren's 09/28/2015   Left middle cerebral artery stroke (Courtney Burke) 07/09/2020   Morbid obesity ([name]) 05/21/2013   Stage 3b chronic kidney disease ([name])    Status post gastric bypass for obesity 09/28/2015    Past Surgical History:  Procedure Laterality Date   ANKLE SURGERY     CARPAL TUNNEL RELEASE     carpel tunnel     IR CT HEAD LTD  06/30/2020   IR INTRA CRAN STENT  06/30/2020   IR PERCUTANEOUS ART THROMBECTOMY/INFUSION INTRACRANIAL INC DIAG ANGIO  06/30/2020   IR RADIOLOGIST EVAL & MGMT  08/26/2020   RADIOLOGY WITH ANESTHESIA N/A 06/30/2020   Procedure: RADIOLOGY WITH ANESTHESIA;  Surgeon: Radiologist, Medication, MD;  Location: [name];  Service: Radiology;  Laterality: N/A;    Allergies: Iodinated diagnostic agents, Doxycycline, Sulfa antibiotics, Codeine, Hydrocodone bit-homatrop mbr, and Ace inhibitors  Medications: Prior to Admission medications   Medication Sig Start Date End Date Taking? Authorizing Provider  apixaban (ELIQUIS) 5 MG TABS tablet Take 1 tablet (5 mg total) by mouth 2 (two) times daily. 10/06/20  Yes Sherran Needs, NP  atorvastatin (LIPITOR) 80 MG tablet Take 1 tablet (80 mg total) by  mouth at bedtime. 07/25/20  Yes Angiulli, Lavon Paganini, PA-C  diphenhydrAMINE (BENADRYL) 50 MG tablet Take 1 tablet (50 mg total) by mouth at bedtime as needed for itching. Take Benadryl 50 mg at 7:30 am on 8/9 (with Prednisone 50 mg.) 01/13/21  Yes Han, Aimee H, PA-C  Multiple Vitamin (MULTIVITAMIN WITH MINERALS) TABS tablet Take 1 tablet by mouth  daily.   Yes [provider]  omeprazole (PRILOSEC) 40 MG capsule Take 40 mg by mouth daily. 07/28/20  Yes [provider]  potassium chloride SA (KLOR-CON) 20 MEQ tablet Take 1 tablet (20 mEq total) by mouth 2 (two) times daily. 12/14/20  Yes Camnitz, Will Hassell Done, MD  predniSONE (DELTASONE) 50 MG tablet Pre medication for contrast allergy. Take 50 mg Prednisone at 7:30 pm on 8/8, at 1:30 am on 8/9; and 7:30 am on 8/9 (with Benadryl 50 mg at 7:30 am.) 01/13/21  Yes Han, Aimee H, PA-C  ticagrelor (BRILINTA) 90 MG TABS tablet Take 1 tablet (90 mg total) by mouth 2 (two) times daily. 09/30/20  Yes Camnitz, Will Hassell Done, MD  ursodiol (ACTIGALL) 250 MG tablet Take 250 mg by mouth 2 (two) times daily. 07/28/20  Yes [provider]  Vitamin D3 (VITAMIN D) 25 MCG tablet TAKE 1 TABLET (1,000 UNITS TOTAL) BY MOUTH DAILY BEFORE BREAKFAST. 07/25/20 07/25/21 Yes [place], Lavon Paganini, PA-C     Family History  Problem Relation Age of Onset   Stroke Maternal Grandfather     Social History   Socioeconomic History   Marital status: Divorced    Spouse name: Not on file   Number of children: Not on file   Years of education: Not on file   Highest education level: Not on file  Occupational History   Not on file  Tobacco Use   Smoking status: Never   Smokeless tobacco: Never  Vaping Use   Vaping Use: Never used  Substance and Sexual Activity   Alcohol use: No   Drug use: No   Sexual activity: Not on file  Other Topics Concern   Not on file  Social History Narrative   Lives with daughter [name]   Right Handed   Drinks no caffeine   Social Determinants of Health   Financial Resource Strain: Not on file  Food Insecurity: Not on file  Transportation Needs: Not on file  Physical Activity: Not on file  Stress: Not on file  Social Connections: Not on file     Review of Systems: A 12 point ROS discussed and pertinent positives are indicated in the HPI above.  All other systems  are negative.  Review of Systems  Constitutional:  Positive for activity change. Negative for fatigue, fever and unexpected weight change.  HENT:  Negative for tinnitus and trouble swallowing.   Eyes:  Negative for visual disturbance.  Respiratory:  Negative for cough and shortness of breath.   Cardiovascular:  Negative for chest pain.  Gastrointestinal:  Negative for abdominal pain.  Neurological:  Positive for speech difficulty. Negative for dizziness, tremors, seizures, syncope, facial asymmetry, weakness, light-headedness, numbness and headaches.  Psychiatric/Behavioral:  Positive for decreased concentration. Negative for behavioral problems.    Vital Signs: BP (!) 156/83   Pulse 66   Temp 98.7 F (37.1 C) (Oral)   Resp 16   Ht 5' 2.5" (1.588 m)   Wt 166 lb (75.3 kg)   LMP 09/28/2012   SpO2 100%   BMI 29.88 kg/m   Physical Exam Vitals reviewed.  HENT:  Mouth/Throat:     Mouth: Mucous membranes are moist.  Cardiovascular:     Rate and Rhythm: Normal rate and regular rhythm.     Heart sounds: Normal heart sounds.  Pulmonary:     Effort: Pulmonary effort is normal.     Breath sounds: Normal breath sounds.  Abdominal:     Palpations: Abdomen is soft.     Tenderness: There is no abdominal tenderness.  Musculoskeletal:        General: No swelling or tenderness. Normal range of motion.  Skin:    General: Skin is warm.  Neurological:     Comments: Answers dob and name Can answer most questions-- but has difficulty forming sentence and thought  Psychiatric:     Comments: Pt Dtr Carolyne Fiscal is with her. She lives with her Dtr--- She has consented to procedure    Imaging: No results found.  Labs:  CBC: Recent Labs    07/11/20 0637 10/06/20 1415 10/20/20 1140 01/17/21 0620  WBC 8.4 5.5 5.1 4.0  HGB 10.9* 12.6 11.7* 12.3  HCT 33.2* 36.9 35.0* 35.8*  PLT 869* 535* 496* 415*    COAGS: Recent Labs    06/30/20 0710  INR 1.1  APTT 25    BMP: Recent Labs     10/06/20 1415 10/20/20 1140 10/24/20 1418 01/17/21 0620  NA 141 141 140 141  K 2.9* 2.4* 3.2* 2.8*  CL 109 111 109 107  CO2 '24 23 22 24  '$ GLUCOSE 92 111* 94 201*  BUN 7* 7* 6* 11  CALCIUM 8.7* 8.8* 8.9 9.4  CREATININE 1.40* 1.28* 1.18* 1.45*  GFRNONAA 41* 46* 51* 40*    LIVER FUNCTION TESTS: Recent Labs    06/30/20 0710 07/01/20 0513 07/07/20 0348 07/11/20 0637  BILITOT 0.5 0.6  --  0.7  AST 21 17  --  31  ALT 15 11  --  15  ALKPHOS 101 82  --  81  PROT 6.4* 5.3*  --  6.7  ALBUMIN 3.4* 2.8* 3.0* 3.0*    TUMOR MARKERS: No results for input(s): AFPTM, CEA, CA199, CHROMGRNA in the last 8760 hours.  Assessment and Plan:  Previous CVA 06/2020 L Middle Cerebral Artery revascularization 06/30/20 Scheduled today for follow up cerebral arteriogram Risks and benefits of cerebral angiogram were discussed with the patient and Daughter Carolyne Fiscal including, but not limited to bleeding, infection, vascular injury, contrast induced renal failure, stroke or even death.  This interventional procedure involves the use of X-rays and because of the nature of the planned procedure, it is possible that we will have prolonged use of X-ray fluoroscopy.  Potential radiation risks to you include (but are not limited to) the following: - A slightly elevated risk for cancer  several years later in life. This risk is typically less than 0.5% percent. This risk is low in comparison to the normal incidence of human cancer, which is 33% for women and 50% for men according to the [place]. - Radiation induced injury can include skin redness, resembling a rash, tissue breakdown / ulcers and hair loss (which can be temporary or permanent).   The likelihood of either of these occurring depends on the difficulty of the procedure and whether you are sensitive to radiation due to previous procedures, disease, or genetic conditions.   IF your procedure requires a prolonged use of radiation,  you will be notified and given written instructions for further action.  It is your responsibility to monitor the irradiated area for the  2 weeks following the procedure and to notify your physician if you are concerned that you have suffered a radiation induced injury.    All of the patient's questions were answered, patient and Dtr are agreeable to proceed. Consent signed and in chart.    Thank you for this interesting consult.  I greatly enjoyed meeting Branna Nibbe and look forward to participating in their care.  A copy of this report was sent to the requesting provider on this date.  Electronically Signed: Lavonia Drafts, PA-C 01/17/2021, 7:24 AM   I spent a total of    25 Minutes in face to face in clinical consultation, greater than 50% of which was counseling/coordinating care for cerebral arteriogram

## 2021-01-19 LAB — CUP PACEART REMOTE DEVICE CHECK
Date Time Interrogation Session: 20220818110728
Implantable Pulse Generator Implant Date: 20220407

## 2021-01-20 ENCOUNTER — Encounter (HOSPITAL_COMMUNITY): Payer: Self-pay

## 2021-01-23 ENCOUNTER — Ambulatory Visit (INDEPENDENT_AMBULATORY_CARE_PROVIDER_SITE_OTHER): Payer: Medicare Other

## 2021-01-23 DIAGNOSIS — I639 Cerebral infarction, unspecified: Secondary | ICD-10-CM | POA: Diagnosis not present

## 2021-02-02 ENCOUNTER — Telehealth: Payer: Self-pay | Admitting: *Deleted

## 2021-02-02 ENCOUNTER — Telehealth: Payer: Self-pay | Admitting: Adult Health

## 2021-02-02 NOTE — Telephone Encounter (Signed)
Mrs [place] daughter Courtney Burke called to report that her mother was having increased headaches over the past few days stating they were severe in nature. She recently had a scan and she reports that they were told the radiologist noted narrowing on the right side of brain which is opposite of where her stroke was and Courtney Burke is concerned this may be a new problem. I have informed her that Dr Letta Pate is out of the office and will not return until 9/6, but that most importantly she should call Dr Mickel Duhamel McCue's office and let them know of these symptoms. I have given her the number to [facility], and I will send a message to Dr Letta Pate inbox for his information when he returns.

## 2021-02-02 NOTE — Telephone Encounter (Signed)
Contacted daughter back, informed her that Janett Billow recommend eval by PCP first who can further eval for underlying causes of new onset headaches as we do not treat her for headaches.  She also wanted a recommendation for a new PCP, her current one is with [facility] and they are no longer happy with them. I advised her to go to [facility] site and search "find a doctor" and research through our options and pick a doctor she feels best to care for her mother.  She understood and was appreciative for the assistance.

## 2021-02-02 NOTE — Telephone Encounter (Signed)
Although we do not treat pt for headaches, any suggestions ?

## 2021-02-02 NOTE — Telephone Encounter (Signed)
Recommend eval by PCP first who can further eval for underlying causes of new onset headaches

## 2021-02-02 NOTE — Telephone Encounter (Signed)
[name] Cynthina Penado on [place] called wanting to be advised on the headaches that the pt has reported to her. Pt stated to daughter yesterday that she has had really bad headaches for the last couple of days. Pt gave her Tylenol this morning but pt informed her that it did not help. Please call daughter back to advise her on what is the next step.

## 2021-02-05 ENCOUNTER — Other Ambulatory Visit (HOSPITAL_COMMUNITY): Payer: Self-pay | Admitting: Nurse Practitioner

## 2021-02-07 ENCOUNTER — Emergency Department (HOSPITAL_COMMUNITY)
Admission: EM | Admit: 2021-02-07 | Discharge: 2021-02-07 | Disposition: A | Discharge status: Home / Self Care | Payer: Medicare Other | Attending: Emergency Medicine | Admitting: Emergency Medicine

## 2021-02-07 ENCOUNTER — Encounter (HOSPITAL_COMMUNITY): Payer: Self-pay

## 2021-02-07 ENCOUNTER — Emergency Department (HOSPITAL_COMMUNITY): Payer: Medicare Other

## 2021-02-07 ENCOUNTER — Other Ambulatory Visit: Payer: Self-pay

## 2021-02-07 DIAGNOSIS — I129 Hypertensive chronic kidney disease with stage 1 through stage 4 chronic kidney disease, or unspecified chronic kidney disease: Secondary | ICD-10-CM | POA: Insufficient documentation

## 2021-02-07 DIAGNOSIS — E1122 Type 2 diabetes mellitus with diabetic chronic kidney disease: Secondary | ICD-10-CM | POA: Insufficient documentation

## 2021-02-07 DIAGNOSIS — R55 Syncope and collapse: Secondary | ICD-10-CM | POA: Insufficient documentation

## 2021-02-07 DIAGNOSIS — R569 Unspecified convulsions: Secondary | ICD-10-CM | POA: Insufficient documentation

## 2021-02-07 DIAGNOSIS — E876 Hypokalemia: Secondary | ICD-10-CM | POA: Insufficient documentation

## 2021-02-07 DIAGNOSIS — Z7901 Long term (current) use of anticoagulants: Secondary | ICD-10-CM | POA: Insufficient documentation

## 2021-02-07 DIAGNOSIS — N1832 Chronic kidney disease, stage 3b: Secondary | ICD-10-CM | POA: Insufficient documentation

## 2021-02-07 DIAGNOSIS — J45909 Unspecified asthma, uncomplicated: Secondary | ICD-10-CM | POA: Diagnosis not present

## 2021-02-07 LAB — COMPREHENSIVE METABOLIC PANEL
ALT: 19 U/L (ref 0–44)
AST: 32 U/L (ref 15–41)
Albumin: 3.5 g/dL (ref 3.5–5.0)
Alkaline Phosphatase: 124 U/L (ref 38–126)
Anion gap: 7 (ref 5–15)
BUN: 7 mg/dL — ABNORMAL LOW (ref 8–23)
CO2: 25 mmol/L (ref 22–32)
Calcium: 8.8 mg/dL — ABNORMAL LOW (ref 8.9–10.3)
Chloride: 108 mmol/L (ref 98–111)
Creatinine, Ser: 1.32 mg/dL — ABNORMAL HIGH (ref 0.44–1.00)
GFR, Estimated: 45 mL/min — ABNORMAL LOW (ref >60)
Glucose, Bld: 100 mg/dL — ABNORMAL HIGH (ref 70–99)
Potassium: 2.4 mmol/L — CL (ref 3.5–5.1)
Sodium: 140 mmol/L (ref 135–145)
Total Bilirubin: 1.4 mg/dL — ABNORMAL HIGH (ref 0.3–1.2)
Total Protein: 6.3 g/dL — ABNORMAL LOW (ref 6.5–8.1)

## 2021-02-07 LAB — TROPONIN I (HIGH SENSITIVITY)
Troponin I (High Sensitivity): 8 ng/L (ref <18)
Troponin I (High Sensitivity): 8 ng/L (ref <18)

## 2021-02-07 LAB — CBC WITH DIFFERENTIAL/PLATELET
Abs Immature Granulocytes: 0.02 10*3/uL (ref 0.00–0.07)
Basophils Absolute: 0 10*3/uL (ref 0.0–0.1)
Basophils Relative: 1 %
Eosinophils Absolute: 0.1 10*3/uL (ref 0.0–0.5)
Eosinophils Relative: 2 %
HCT: 34.6 % — ABNORMAL LOW (ref 36.0–46.0)
Hemoglobin: 11.6 g/dL — ABNORMAL LOW (ref 12.0–15.0)
Immature Granulocytes: 1 %
Lymphocytes Relative: 24 %
Lymphs Abs: 1.1 10*3/uL (ref 0.7–4.0)
MCH: 28.2 pg (ref 26.0–34.0)
MCHC: 33.5 g/dL (ref 30.0–36.0)
MCV: 84.2 fL (ref 80.0–100.0)
Monocytes Absolute: 0.4 10*3/uL (ref 0.1–1.0)
Monocytes Relative: 8 %
Neutro Abs: 2.8 10*3/uL (ref 1.7–7.7)
Neutrophils Relative %: 64 %
Platelets: 396 10*3/uL (ref 150–400)
RBC: 4.11 MIL/uL (ref 3.87–5.11)
RDW: 14.2 % (ref 11.5–15.5)
WBC: 4.4 10*3/uL (ref 4.0–10.5)
nRBC: 0 % (ref 0.0–0.2)

## 2021-02-07 LAB — CBG MONITORING, ED: Glucose-Capillary: 84 mg/dL (ref 70–99)

## 2021-02-07 LAB — MAGNESIUM: Magnesium: 1.9 mg/dL (ref 1.7–2.4)

## 2021-02-07 IMAGING — CT CT HEAD W/O CM
4 of 5 series · 16 of 47 positions shown, 18 images · non-contrast
Comparison: Head CT [DATE], brain MRI [DATE]

CLINICAL DATA: Headache, new or worsening (Age >= 50y)

EXAM:
CT HEAD WITHOUT CONTRAST
TECHNIQUE: Contiguous axial images were obtained from the base of the skull
through the vertex without intravenous contrast.

[Series 3: head without cor · coronal · non-contrast · 0.32mm/px · 3 of 60 slices shown]
[im 20/60  brain]
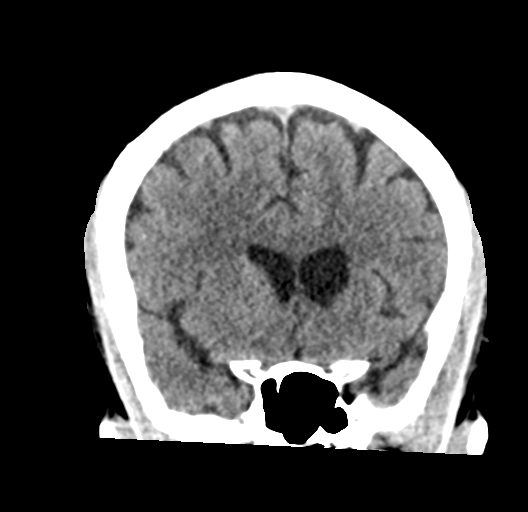
[im 27/60  brain]
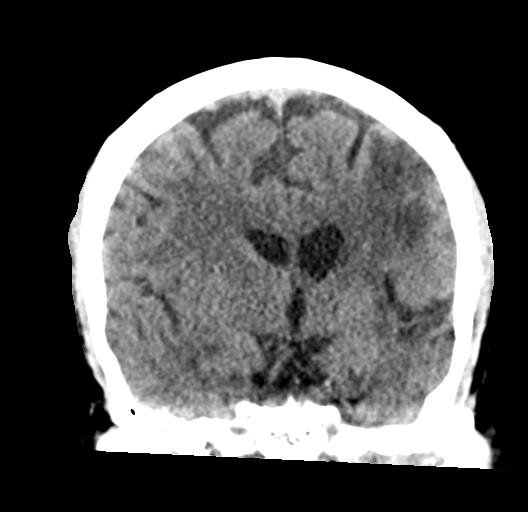
[im 33/60  brain]
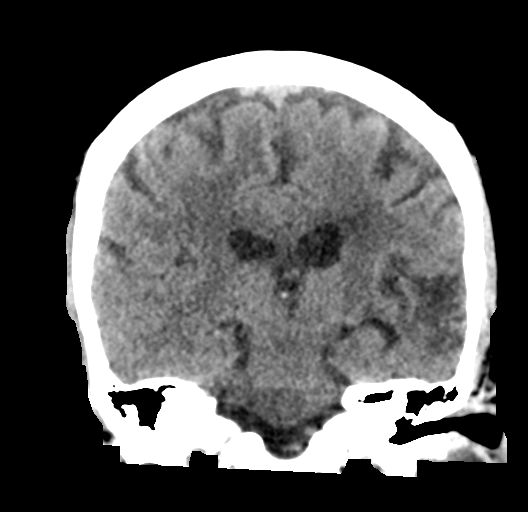

[Series 4: head without sag · sagittal · non-contrast · 0.32mm/px · 3 of 47 slices shown]
[im 16/47  brain]
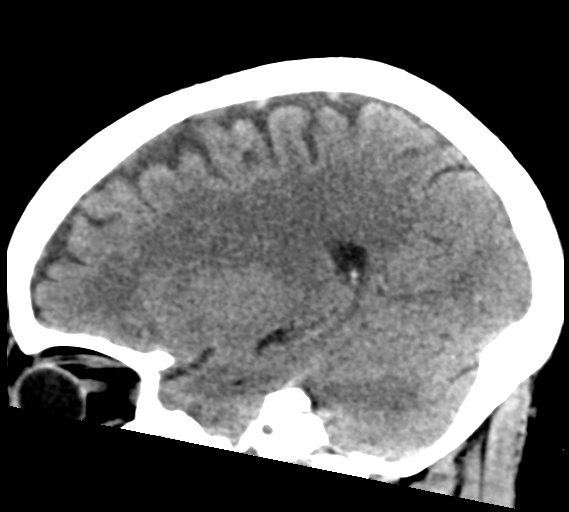
[im 24/47  brain]
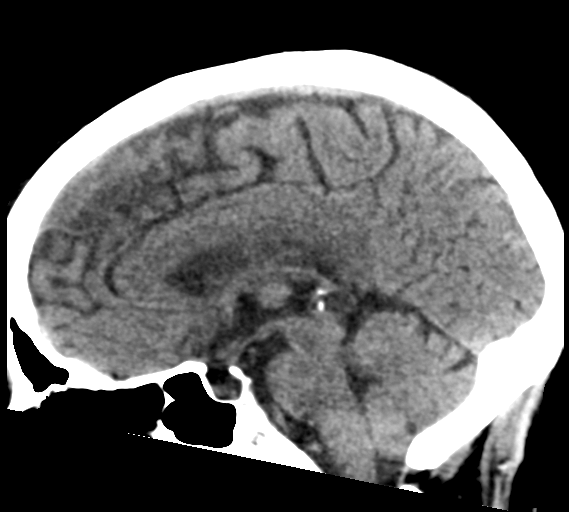
[im 31/47  brain]
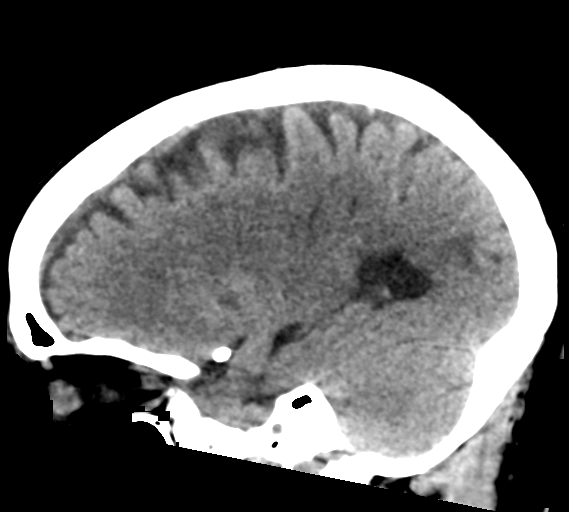

[Series 5: head without ax · axial · non-contrast · 0.33mm/px · z∈[-129,-12]mm · 8 of 51 slices shown, 10 images]
[im 6/51  brain]
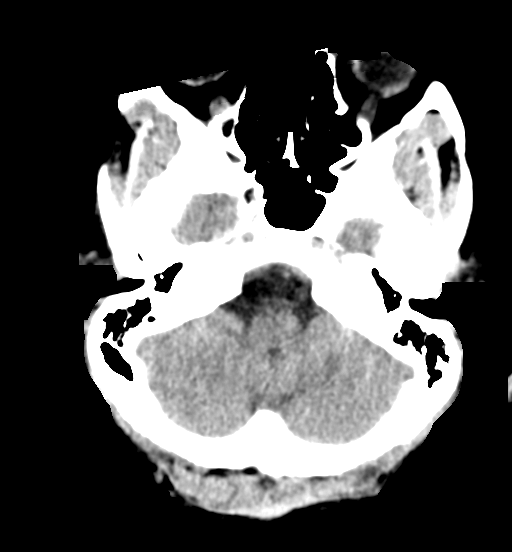
[im 6/51  bone]
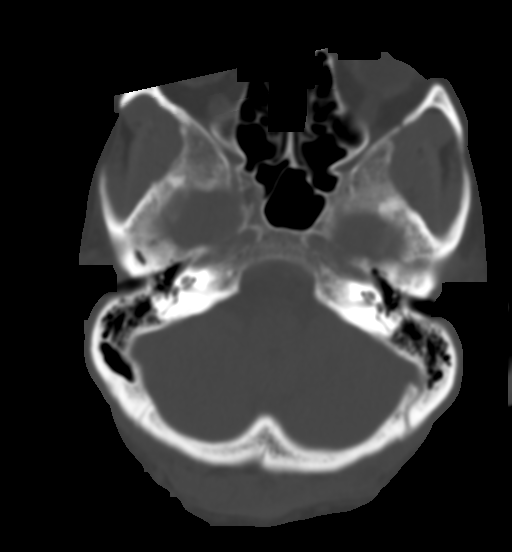
[im 12/51  brain]
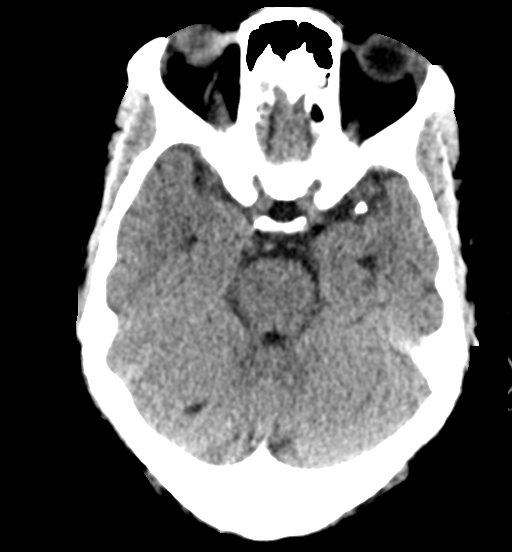
[im 17/51  brain]
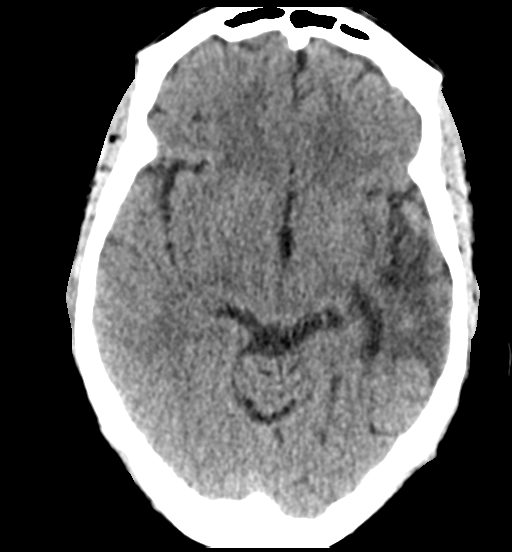
[im 23/51  brain]
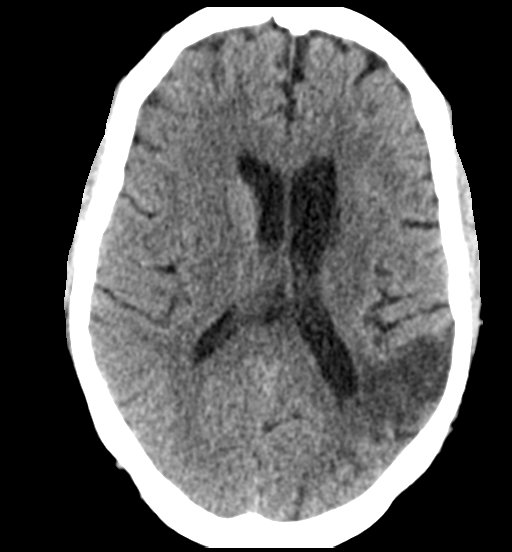
[im 28/51  brain]
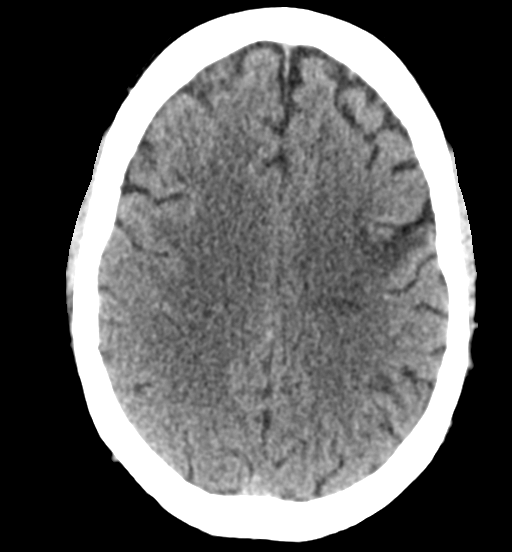
[im 28/51  bone]
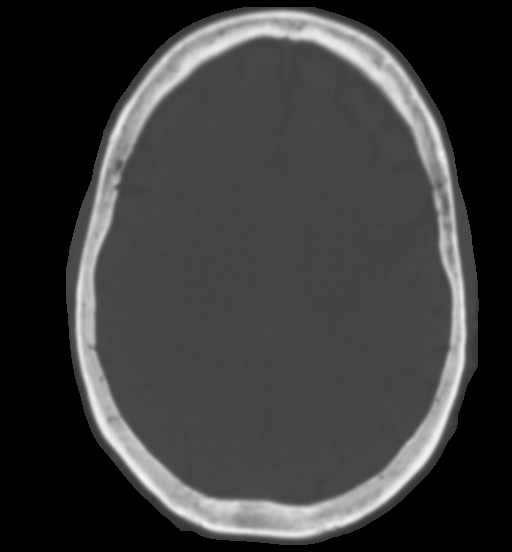
[im 34/51  brain]
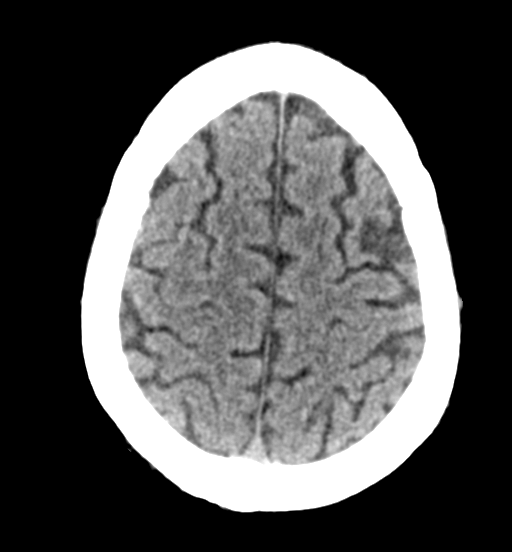
[im 39/51  brain]
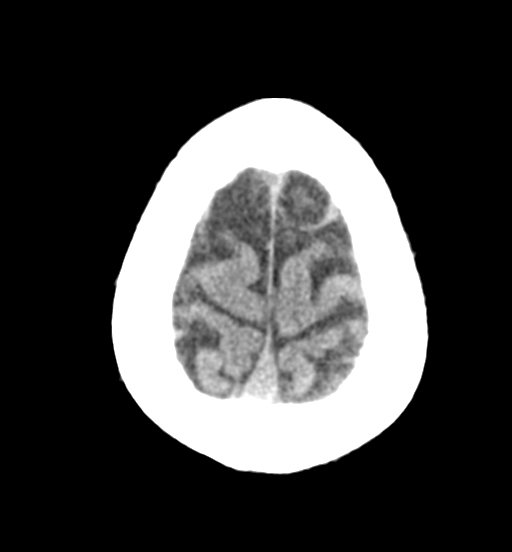
[im 45/51  brain]
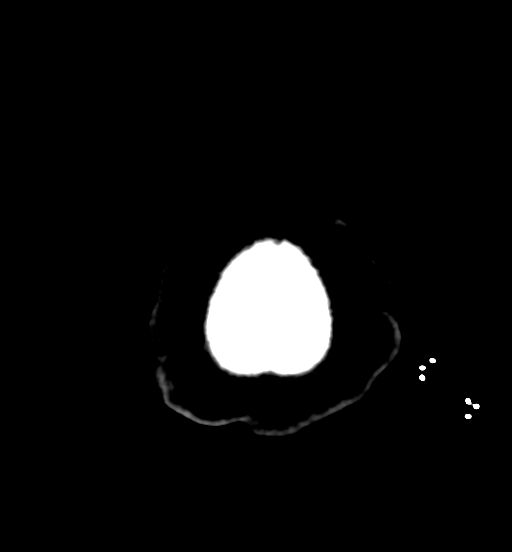

[Series 6: head without · axial · non-contrast · 0.39mm/px · z∈[-88,-58]mm · 2 of 31 slices shown]
[im 7/31  brain]
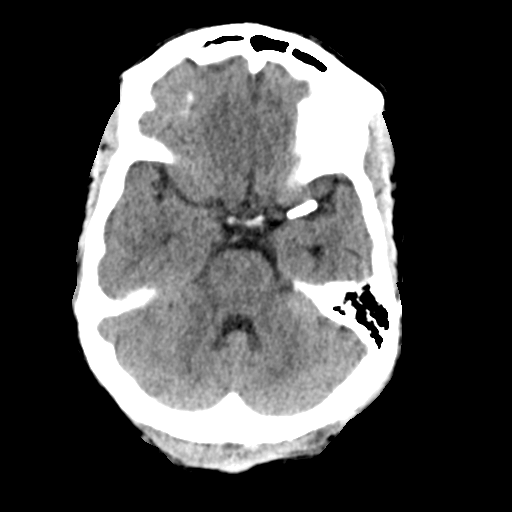
[im 13/31  brain]
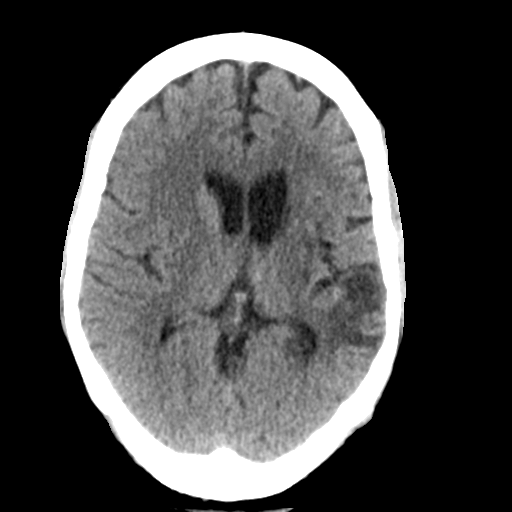

[16 of 47 positions shown; findings below may reference images not displayed]

FINDINGS: Brain: Expected evolution of left MCA infarct from prior [HOSPITAL]
acute presentation. Remote lacunar infarct in the left thalamus.
Mild ex vacuo dilatation of the left lateral ventricle. Remote right
cerebellar infarct. No hemorrhage or evidence of acute ischemia. No
midline shift or mass effect. No hydrocephalus. No subdural or
extra-axial collection.

Vascular: Stent in the left MCA.  No hyperdense vessel.

Skull: No fracture or focal lesion.

Sinuses/Orbits: Paranasal sinuses and mastoid air cells are clear.
The visualized orbits are unremarkable.

Other: None.
IMPRESSION: 1. No acute intracranial abnormality.
2. Expected evolution of left MCA infarct from prior [HOSPITAL]
acute presentation. Remote lacunar infarcts in the left thalamus and
right cerebellum.
3. Stent in the left MCA.

## 2021-02-07 MED ORDER — POTASSIUM CHLORIDE 10 MEQ/100ML IV SOLN
10.0000 meq | INTRAVENOUS | Status: AC
  Administered 2021-02-07 (×3): 10 meq via INTRAVENOUS
  Filled 2021-02-07 (×3): qty 100

## 2021-02-07 MED ORDER — POTASSIUM CHLORIDE CRYS ER 20 MEQ PO TBCR: 20.0000 meq | EXTENDED_RELEASE_TABLET | Freq: Every day | ORAL | 0 refills | Status: DC

## 2021-02-07 MED ORDER — SODIUM CHLORIDE 0.9 % IV SOLN
2000.0000 mg | Freq: Once | INTRAVENOUS | Status: AC
  Administered 2021-02-07: 2000 mg via INTRAVENOUS
  Filled 2021-02-07: qty 20

## 2021-02-07 MED ORDER — LEVETIRACETAM 500 MG PO TABS: 500.0000 mg | ORAL_TABLET | Freq: Two times a day (BID) | ORAL | 1 refills | Status: DC

## 2021-02-07 MED ORDER — MAGNESIUM SULFATE 2 GM/50ML IV SOLN
2.0000 g | Freq: Once | INTRAVENOUS | Status: AC
  Administered 2021-02-07: 2 g via INTRAVENOUS
  Filled 2021-02-07: qty 50

## 2021-02-07 NOTE — ED Provider Notes (Signed)
Emergency Medicine Provider Triage Evaluation Note  Courtney Burke , a 65 y.o. female  was evaluated in triage.  Pt complains of syncopal episode after having a bowel movement.  Patient denies any chest pain or shortness of breath.  Unsure if she hit her head.  States she was found by her daughter.  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  Review of Systems  Positive: Syncope, nausea, aphasia Negative: Chest pain, shortness of breath, neck pain, back pain  Physical Exam  BP 128/65 (BP Location: Right Arm)   Pulse (!) 59   Temp 98.6 F (37 C) (Oral)   Resp 16   LMP 09/28/2012   SpO2 100%  Gen:   Awake, no distress   Resp:  Normal effort  MSK:   Moves extremities without difficulty Other:  No facial asymmetry, EOM intact bilaterally, pupils PERRL.  Head is atraumatic.  No midline tenderness or deformity to cervical, thoracic, lumbar spine.  Grip strength equal.  Medical Decision Making  Medically screening exam initiated at 3:41 PM.  Appropriate orders placed.  [name] was informed that the remainder of the evaluation will be completed by another provider, this initial triage assessment does not replace that evaluation, and the importance of remaining in the ED until their evaluation is complete.  The patient appears stable so that the remainder of the work up may be completed by another provider.      Loni Beckwith, PA-C 02/07/21 1542    Daleen Bo, MD 02/07/21 1755

## 2021-02-07 NOTE — ED Triage Notes (Signed)
Pt BIB [facility] from home, pt called for syncopal episode after using the bathroom. Pt w/a recent stroke deficits of aphagia, currently in therapy for same. No new neuro deficits noted with EMS  Pt also c/o nausea.   '4mg'$  Zofran IVP 18g LAC  BP 160/90 HR 75 100% RA  RR 16 CBG 128

## 2021-02-07 NOTE — Discharge Instructions (Addendum)
Make sure that you do not take a bath unsupervised with seizure-like activity as this could be very unsafe.  Follow closely with your neurologist.  Take the medications I have prescribed daily as directed. Get help right away if: You have: A seizure that does not stop after 5 minutes. Several seizures in a row without a complete recovery between seizures. A seizure that makes it harder to breathe. A seizure that leaves you unable to speak or use a part of your body. You do not wake up right away after a seizure. You injure yourself during a seizure. You have confusion or pain right after a seizure.

## 2021-02-07 NOTE — ED Notes (Signed)
EDP [place] [facility] [place] charge RN made aware potassium 2.4

## 2021-02-07 NOTE — ED Provider Notes (Signed)
[facility] EMERGENCY DEPARTMENT Provider Note   CSN: [id number] Arrival date & time: 02/07/21  1528     History Chief Complaint  Patient presents with   Near Syncope    Courtney Burke is a 65 y.o. female.  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  History is given by the patient as best she is able as well as her daughter who is at bedside.  There is a level 5 caveat due to expressive aphasia.  Apparently the patient has been complaining of a headache for the past 2 days.  She does not usually get headaches.  Today her daughter said she was sitting in a chair when she leaned down and grabbed her head and began complaining of a headache.  When she sat back up her eyes rolled back in her head and she was jerking in the chair.  Her daughter said that she was not responsive during that time it lasted about 5 or 6 minutes.  She is unsure if it looked like a seizure but when she did come to she was confused and lost control of her bowel.  Her daughter said that he she walked her to the bathroom and when she was in the bathroom 6 seemed to come to and was confused as to why she was covered in stool.  Currently does not have a headache or any new neurodeficits.  She denies any weakness   Near Syncope Associated symptoms include headaches.      Past Medical History:  Diagnosis Date   Acute ischemic left MCA stroke ([place]) 06/30/2020   Acute ischemic stroke (HCC)    Acute stroke due to occlusion of left middle cerebral artery ([place]) 06/30/2020   Age-related nuclear cataract of both eyes 09/28/2015   Arthralgia of multiple joints 09/28/2015   Asthma    Borderline diabetes    Cerebrovascular accident (CVA) ([place])    Chronic bilateral thoracic back pain 06/05/2018   Chronic renal insufficiency, stage 1 08/11/2014    Diabetes mellitus type 2 in obese ([place])    Dyslipidemia    Dysphagia, post-stroke    Erosive gastritis 07/20/2019   Formatting of this note might be different from the original. On EGD 07/2019   Esophagitis 07/20/2019   Formatting of this note might be different from the original. Added automatically from request for surgery [id number]  Formatting of this note might be different from the original. On EGD 07/2019   Essential hypertension 09/28/2015   Essential thrombocytosis ([place]) 09/28/2015   Gallstones 08/18/2019   Formatting of this note might be different from the original. 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   Morbid obesity ([place]) 05/21/2013   Stage 3b chronic kidney disease ([place])    Status post gastric bypass for obesity 09/28/2015    Patient Active Problem List   Diagnosis Date Noted   Left middle cerebral artery stroke ([place]) 07/09/2020   Acute ischemic stroke ([place])    Cerebrovascular accident (CVA) ([place])    Dyslipidemia    Stage 3b chronic kidney disease ([place])    Dysphagia, post-stroke  Diabetes mellitus type 2 in obese (HCC)    Acute ischemic left MCA stroke ([place]) 06/30/2020   Acute stroke due to occlusion of left middle cerebral artery ([place]) 06/30/2020   Gallstones 08/18/2019   Esophagitis 07/20/2019   Erosive gastritis 07/20/2019   Chronic bilateral thoracic back pain 06/05/2018   Arthralgia of multiple joints 09/28/2015   Age-related nuclear cataract of both eyes 09/28/2015   Asthma 09/28/2015   Essential hypertension 09/28/2015   Essential thrombocytosis ([place]) 09/28/2015   Gastroesophageal reflux disease without esophagitis 09/28/2015   Keratoconjunctivitis sicca of  both eyes not specified as Sjogren's 09/28/2015   Status post gastric bypass for obesity 09/28/2015   Chronic renal insufficiency, stage 1 08/11/2014   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   Morbid obesity ([place]) 05/21/2013   Hyperlipidemia, unspecified 03/16/2013    Past Surgical History:  Procedure Laterality Date   ANKLE SURGERY     CARPAL TUNNEL RELEASE     carpel tunnel     IR ANGIO INTRA EXTRACRAN SEL COM CAROTID INNOMINATE BILAT MOD SED  01/17/2021   IR ANGIO VERTEBRAL SEL SUBCLAVIAN INNOMINATE UNI R MOD SED  01/17/2021   IR CT HEAD LTD  06/30/2020   IR INTRA CRAN STENT  06/30/2020   IR PERCUTANEOUS ART THROMBECTOMY/INFUSION INTRACRANIAL INC DIAG ANGIO  06/30/2020   IR RADIOLOGIST EVAL & MGMT  08/26/2020   IR US GUIDE VASC ACCESS RIGHT  01/17/2021   RADIOLOGY WITH ANESTHESIA N/A 06/30/2020   Procedure: RADIOLOGY WITH ANESTHESIA;  Surgeon: Radiologist, Medication, MD;  Location: [place];  Service: Radiology;  Laterality: N/A;     OB History   No obstetric history on file.     Family History  Problem Relation Age of Onset   Stroke Maternal Grandfather     Social History   Tobacco Use   Smoking status: Never   Smokeless tobacco: Never  Vaping Use   Vaping Use: Never used  Substance Use Topics   Alcohol use: No   Drug use: No    Home Medications Prior to Admission medications   Medication Sig Start Date End Date Taking? Authorizing Provider  atorvastatin (LIPITOR) 80 MG tablet Take 1 tablet (80 mg total) by mouth at bedtime. 07/25/20  Yes Angiulli, Lavon Paganini, PA-C  levETIRAcetam (KEPPRA) 500 MG tablet Take 1 tablet (500 mg total) by mouth 2 (two) times daily. 02/07/21  Yes Margarita Mail, PA-C  Multiple Vitamin (MULTIVITAMIN WITH MINERALS) TABS tablet Take 1 tablet by mouth daily.   Yes [provider]  omeprazole (PRILOSEC) 40 MG capsule Take 40 mg by mouth daily. 07/28/20  Yes [provider]  ticagrelor (BRILINTA) 90 MG TABS tablet Take 1 tablet (90  mg total) by mouth 2 (two) times daily. 09/30/20  Yes Camnitz, Will Hassell Done, MD  ursodiol (ACTIGALL) 250 MG tablet Take 250 mg by mouth 2 (two) times daily. 07/28/20  Yes [provider]  Vitamin D3 (VITAMIN D) 25 MCG tablet TAKE 1 TABLET (1,000 UNITS TOTAL) BY MOUTH DAILY BEFORE BREAKFAST. 07/25/20 07/25/21 Yes Angiulli, Lavon Paganini, PA-C  diphenhydrAMINE (BENADRYL) 50 MG tablet Take 1 tablet (50 mg total) by mouth at bedtime as needed for itching. Take Benadryl 50 mg at 7:30 am on 8/9 (with Prednisone 50 mg.) Patient not taking: Reported on 02/07/2021 01/13/21   Han, Aimee H, PA-C  ELIQUIS 5 MG TABS tablet TAKE 1 TABLET BY MOUTH TWICE A DAY 02/08/21   Camnitz, Ocie Doyne, MD  potassium chloride SA (KLOR-CON) 20 MEQ tablet Take  1 tablet (20 mEq total) by mouth daily. 02/08/21   Camnitz, Will Hassell Done, MD  predniSONE (DELTASONE) 50 MG tablet Pre medication for contrast allergy. Take 50 mg Prednisone at 7:30 pm on 8/8, at 1:30 am on 8/9; and 7:30 am on 8/9 (with Benadryl 50 mg at 7:30 am.) Patient not taking: Reported on 02/07/2021 01/13/21   Han, Aimee H, PA-C    Allergies    Iodinated diagnostic agents, Doxycycline, Sulfa antibiotics, Codeine, Hydrocodone bit-homatrop mbr, and Ace inhibitors  Review of Systems   Review of Systems  Unable to perform ROS: Other  Constitutional:  Negative for chills and fever.  Cardiovascular:  Positive for near-syncope.  Neurological:  Positive for headaches.   Physical Exam Updated Vital Signs BP 139/75   Pulse 62   Temp 98.7 F (37.1 C) (Oral)   Resp 15   LMP 09/28/2012   SpO2 100%   Physical Exam Vitals and nursing note reviewed.  Constitutional:      General: She is not in acute distress.    Appearance: She is well-developed. She is not diaphoretic.  HENT:     Head: Normocephalic and atraumatic.     Right Ear: External ear normal.     Left Ear: External ear normal.     Nose: Nose normal.     Mouth/Throat:     Mouth: Mucous membranes are moist.   Eyes:     General: No scleral icterus.    Conjunctiva/sclera: Conjunctivae normal.  Cardiovascular:     Rate and Rhythm: Normal rate and regular rhythm.     Heart sounds: Normal heart sounds. No murmur heard.   No friction rub. No gallop.  Pulmonary:     Effort: Pulmonary effort is normal. No respiratory distress.     Breath sounds: Normal breath sounds.  Abdominal:     General: Bowel sounds are normal. There is no distension.     Palpations: Abdomen is soft. There is no mass.     Tenderness: There is no abdominal tenderness. There is no guarding.  Musculoskeletal:     Cervical back: Normal range of motion.  Skin:    General: Skin is warm and dry.  Neurological:     Mental Status: She is alert.  Psychiatric:        Behavior: Behavior normal.    ED Results / Procedures / Treatments   Labs (all labs ordered are listed, but only abnormal results are displayed) Labs Reviewed  COMPREHENSIVE METABOLIC PANEL - Abnormal; Notable for the following components:      Result Value   Potassium 2.4 (*)    Glucose, Bld 100 (*)    BUN 7 (*)    Creatinine, Ser 1.32 (*)    Calcium 8.8 (*)    Total Protein 6.3 (*)    Total Bilirubin 1.4 (*)    GFR, Estimated 45 (*)    All other components within normal limits  CBC WITH DIFFERENTIAL/PLATELET - Abnormal; Notable for the following components:   Hemoglobin 11.6 (*)    HCT 34.6 (*)    All other components within normal limits  MAGNESIUM  CBG MONITORING, ED  TROPONIN I (HIGH SENSITIVITY)  TROPONIN I (HIGH SENSITIVITY)    EKG EKG Interpretation  Date/Time:  Tuesday February 07 2021 15:28:13 EDT Ventricular Rate:  62 PR Interval:  162 QRS Duration: 80 QT Interval:  440 QTC Calculation: 446 R Axis:   3 Text Interpretation: Normal sinus rhythm Minimal voltage criteria for LVH, may be normal variant (  R in aVL ) Borderline ECG Confirmed by Pattricia Boss [phone]) on 02/07/2021 9:26:26 PM  Radiology CT HEAD WO CONTRAST (5MM)  Result  Date: 02/07/2021 CLINICAL DATA:  Headache, new or worsening (Age >= 50y) EXAM: CT HEAD WITHOUT CONTRAST TECHNIQUE: Contiguous axial images were obtained from the base of the skull through the vertex without intravenous contrast. COMPARISON:  Head CT 07/02/2020, brain MRI 07/01/2020 FINDINGS: Brain: Expected evolution of left MCA infarct from prior imaging at acute presentation. Remote lacunar infarct in the left thalamus. Mild ex vacuo dilatation of the left lateral ventricle. Remote right cerebellar infarct. No hemorrhage or evidence of acute ischemia. No midline shift or mass effect. No hydrocephalus. No subdural or extra-axial collection. Vascular: Stent in the left MCA.  No hyperdense vessel. Skull: No fracture or focal lesion. Sinuses/Orbits: Paranasal sinuses and mastoid air cells are clear. The visualized orbits are unremarkable. Other: None. IMPRESSION: 1. No acute intracranial abnormality. 2. Expected evolution of left MCA infarct from prior imaging at acute presentation. Remote lacunar infarcts in the left thalamus and right cerebellum. 3. Stent in the left MCA. Electronically Signed   By: Keith Rake M.D.   On: 02/07/2021 19:40    Procedures Procedures   Medications Ordered in ED Medications  magnesium sulfate IVPB 2 g 50 mL (0 g Intravenous Stopped 02/07/21 2141)  potassium chloride 10 mEq in 100 mL IVPB (0 mEq Intravenous Stopped 02/07/21 2301)  levETIRAcetam (KEPPRA) 2,000 mg in sodium chloride 0.9 % 250 mL IVPB (0 mg Intravenous Stopped 02/07/21 2119)    ED Course  I have reviewed the triage vital signs and the nursing notes.  Pertinent labs & imaging results that were available during my care of the patient were reviewed by me and considered in my medical decision making (see chart for details).  Clinical Course as of 02/08/21 1425  Tue Feb 07, 2021  2013 2000 mg Keppra- 500 BID OP f/u [AH]    Clinical Course User Index [AH] Margarita Mail, PA-C   MDM Rules/Calculators/A&P                            Patient here with seizure like activity s/p Left MCA infarct. I ordered and reviewed labs that include  CBCwith mild normocytic anemia CMP- with hypokalemia- appears acute on chronic, baseline renal insufficiency and chronic hyperglycemia MAG, and troponins WNL  EKG  NSR 62- LVH   CT head shows sequelae of infarct, no acute abnormalities  Case discussed with Dr. Rory Percy- feels that she is high risk for seizure given her hx and should be given Keppra loading dose 2g, and dc with '500mg'$  BID + close OP f/u She has an established outside care relationship at [facility]  Potassium repleated in the ED with 3 runs IV + mag.  She will be discharged with oral potassium and will need to follow with her pcp for continued monitoring.   Patient and duaghter are comfortable with the plan.  Patient appears safe for discharge at this time.  Final Clinical Impression(s) / ED Diagnoses Final diagnoses:  Observed seizure-like activity ([place])  Hypokalemia    Rx / DC Orders ED Discharge Orders          Ordered    levETIRAcetam (KEPPRA) 500 MG tablet  2 times daily        02/07/21 2256    potassium chloride SA (KLOR-CON) 20 MEQ tablet  Daily,   Status:  Discontinued  02/07/21 [place], [place], PA-C 02/08/21 1432    Pattricia Boss, MD 02/09/21 [phone]

## 2021-02-08 MED ORDER — POTASSIUM CHLORIDE CRYS ER 20 MEQ PO TBCR: 20.0000 meq | EXTENDED_RELEASE_TABLET | Freq: Every day | ORAL | 2 refills | Status: DC

## 2021-02-08 NOTE — Telephone Encounter (Signed)
Prescription refill request for Eliquis received. Indication: Afib  Last office visit: 09/08/20 (Camnitz)  Scr: 1.32 (02/07/21)  Age: 65 Weight: 75.3kg  Appropriate dose and refill sent to requested pharmacy.

## 2021-02-08 NOTE — Progress Notes (Signed)
Carelink Summary Report / Loop Recorder 

## 2021-02-13 ENCOUNTER — Inpatient Hospital Stay: Payer: Medicare Other | Admitting: Adult Health

## 2021-02-13 ENCOUNTER — Ambulatory Visit (INDEPENDENT_AMBULATORY_CARE_PROVIDER_SITE_OTHER): Payer: Medicare Other | Admitting: Adult Health

## 2021-02-13 ENCOUNTER — Encounter: Payer: Self-pay | Admitting: Adult Health

## 2021-02-13 VITALS — BP 132/82 | HR 72 | Ht 62.0 in | Wt 157.0 lb

## 2021-02-13 DIAGNOSIS — I63512 Cerebral infarction due to unspecified occlusion or stenosis of left middle cerebral artery: Secondary | ICD-10-CM

## 2021-02-13 DIAGNOSIS — R569 Unspecified convulsions: Secondary | ICD-10-CM | POA: Diagnosis not present

## 2021-02-13 DIAGNOSIS — I69398 Other sequelae of cerebral infarction: Secondary | ICD-10-CM

## 2021-02-13 MED ORDER — LEVETIRACETAM 500 MG PO TABS: 500.0000 mg | ORAL_TABLET | Freq: Two times a day (BID) | ORAL | 3 refills | Status: DC

## 2021-02-13 NOTE — Patient Instructions (Signed)
Continue keppra '500mg'$  twice daily for seizure prevention - refill provided  Complete EEG for recent seizure activity  Continue Eliquis (apixaban) daily  and atorvastatin '80mg'$  daily  for secondary stroke prevention  Continue to follow up with PCP regarding cholesterol and blood pressure management  Maintain strict control of hypertension with blood pressure goal below 130/90 and cholesterol with LDL cholesterol (bad cholesterol) goal below 70 mg/dL.       Followup in the future with me in 4 months or call earlier if needed       Thank you for coming to see [place] at [facility]. I hope we have been able to provide you high quality care today.  You may receive a patient satisfaction survey over the next few weeks. We would appreciate your feedback and comments so that we may continue to improve ourselves and the health of our patients.    Seizure, Adult A seizure is a sudden burst of abnormal electrical and chemical activity in the brain. Seizures usually last from 30 seconds to 2 minutes. The abnormal activity temporarily interrupts normal brain function. Many types of seizures can affect adults. A seizure can cause many different symptoms depending on where in the brain it starts. What are the causes? Common causes of this condition include: Fever or infection. Brain injury, head trauma, bleeding in the brain, or a brain tumor. Low levels of blood sugar or salt (sodium). Kidney problems or liver problems. Metabolic disorders or other conditions that are passed from parent to child (are inherited). Reaction to a substance, such as a drug or a medicine, or suddenly stopping the use of a substance (withdrawal). A stroke. Developmental disorders such as autism spectrum disorder or cerebral palsy. In some cases, the cause of a seizure may not be known. Some people who have a seizure never have another one. A person who has repeated seizures over time without a clear  cause has a condition called epilepsy. What increases the risk? You are more likely to develop this condition if: You have a family history of epilepsy. You have had a tonic-clonic seizure before. This type of seizure causes tightening (contraction) of the muscles of the whole body and loss of consciousness. You have a history of head trauma, lack of oxygen at birth, or strokes. What are the signs or symptoms? There are many different types of seizures. The symptoms vary depending on the type of seizure you have. Symptoms occur during the seizure. They may also occur before a seizure (aura) and after a seizure (postictal). Symptoms may include the following: Symptoms during a seizure Uncontrollable shaking (convulsions) with fast, jerky movements of muscles. Stiffening of the body. Breathing problems. Confusion, staring, or unresponsiveness. Head nodding, eye blinking or fluttering, or rapid eye movements. Drooling, grunting, or making clicking sounds with your mouth. Loss of bladder control and bowel control. Symptoms before a seizure Fear or anxiety. Nausea. Vertigo. This is a feeling like: You are moving when you are not. Your surroundings are moving when they are not. Dj vu. This is a feeling of having seen or heard something before. Odd tastes or smells. Changes in vision, such as seeing flashing lights or spots. Symptoms after a seizure Confusion. Sleepiness. Headache. Sore muscles. How is this diagnosed? This condition may be diagnosed based on: A description of your symptoms. Video of your seizures can be helpful. Your medical history. A physical exam. You may also have tests, including: Blood tests. CT scan. MRI. Electroencephalogram (EEG). This test  measures electrical activity in the brain. An EEG can predict whether seizures will return. A spinal tap, also called a lumbar puncture. This is the removal and testing of fluid that surrounds the brain and spinal  cord. How is this treated? Most seizures will stop on their own in less than 5 minutes, and no treatment is needed. Seizures that last longer than 5 minutes will usually need treatment. Seizures may be treated with: Medicines given through an IV. Avoiding known triggers, such as medicines that you take for another condition. Medicines to control seizures or prevent future seizures (antiepileptics), if epilepsy caused your seizures. Medical devices to prevent and control seizures. Surgery to stop seizures or to reduce how often seizures happen, if you have epilepsy that does not respond to medicines. A diet low in carbohydrates and high in fat (ketogenic diet). Follow these instructions at home: Medicines Take over-the-counter and prescription medicines only as told by your health care provider. Avoid any substances that may prevent your medicine from working properly, such as alcohol. Activity Follow instructions about activities, such as driving or swimming, that would be dangerous if you had another seizure. Wait until your health care provider says it is safe to do them. If you live in the [place] check with your local department of motor vehicles [facility]) to find out about local driving laws. Each state has specific rules about when you can legally drive again. Get enough rest. Lack of sleep can make seizures more likely to occur. Educating others  Teach friends and family what to do if you have a seizure. They should: Help you get down to the ground, to prevent a fall. Cushion your head and move items away from your body. Loosen any tight clothing around your neck. Turn you on your side. If you vomit, this helps keep your airway clear. Know whether or not you need emergency care. Stay with you until you recover. Also, tell them what not to do if you have a seizure. Tell them: They should not hold you down. Holding you down will not stop the seizure. They should not put anything in your  mouth. General instructions Avoid anything that has ever triggered a seizure for you. Keep a seizure diary. Record what you remember about each seizure, especially anything that might have triggered it. Keep all follow-up visits. This is important. Contact a health care provider if: You have another seizure or seizures. Call each time you have a seizure. Your seizure pattern changes. You continue to have seizures with treatment. You have symptoms of an infection or illness. Either of these might increase your risk of having a seizure. You are unable to take your medicine. Get help right away if: You have: A seizure that does not stop after 5 minutes. Several seizures in a row without a complete recovery between seizures. A seizure that makes it harder to breathe. A seizure that leaves you unable to speak or use a part of your body. You do not wake up right away after a seizure. You injure yourself during a seizure. You have confusion or pain right after a seizure. These symptoms may represent a serious problem that is an emergency. Do not wait to see if the symptoms will go away. Get medical help right away. Call your local emergency services (911 in the [place] Do not drive yourself to the hospital. Summary Seizures are caused by abnormal electrical and chemical activity in the brain. The activity disrupts normal brain function and can cause  various symptoms. Seizures have many causes, including illness, head injuries, low levels of blood sugar or salt, and certain conditions. Most seizures will stop on their own in less than 5 minutes. Seizures that last longer than 5 minutes are a medical emergency and need treatment right away. Many medicines are used to treat seizures. Take over-the-counter and prescription medicines only as told by your health care provider. This information is not intended to replace advice given to you by your health care provider. Make sure you discuss any questions  you have with your health care provider. Document Revised: 11/27/2019 Document Reviewed: 11/27/2019 Elsevier Patient Education  [place].

## 2021-02-13 NOTE — Progress Notes (Signed)
[facility] [address] [place]. [place] [address] [phone]       OFFICE FOLLOW-UP NOTE  [name] Date of Birth:  Oct 04, 1955 Medical Record Number:  [id number]    Reason for visit: Stroke follow-up Primary neurologist: Dr. Leonie Man  Chief Complaint  Patient presents with   Follow-up    Rm 3 with brother Merry Proud here for f/u on seizure like activity. Pt reports seizure like event on 02/07/21 was started on Keppra 500 bid      HPI:   Today, 02/13/2021, Ms. Hanly returns for acute visit in setting of recent seizure activity.  She is accompanied by her brother.  Seizure type event 02/07/2021 evaluated in ED with symptoms of headache and then tonic-clonic jerking lasting 5 to 6 minutes with residual confusion and bowel incontinence.  Initiated Keppra 500 mg twice daily. CT head unremarkable.  No additional events since discharge.  Remains on Keppra 500 mg twice daily tolerating without side effects.  Reports slight worsening of speech since seizure event.  Was working with SLP and PT prior to seizure but has not yet restarted.  Continues to use a cane and denies any recent falls.  Denies new stroke/[name] symptoms.  Remains on Eliquis and atorvastatin.  Also compliant on Brilinta per IR recommendations.  No further concerns at this time.    History provided for reference purposes only Update 01/04/2021 JM: Ms. Madruga returns for 58-monthstroke follow-up accompanied by her brother, JMerry Proud  Working with SLP for residual aphasia but notes ongoing improvement. Gait has also been improving working with PT - uses cane - did have a fall last month but thankfully without injury.  Denies new stroke/[name] symptoms. ILR showed evidence of atrial fibrillation on 10/05/2020 -Eliquis initiated in addition to BTenino ASA d/c'd.  She does have mild bruising but otherwise tolerating.  Has not yet scheduled diagnostic angiogram with Dr. DEstanislado Pandy  Compliant on atorvastatin without  associated side effects.  Blood pressure 136/82. Monitors at home and typically stable 120s/70s.  No further concerns at this time.  Initial visit 10/05/2020 Dr. SLeonie Man Ms. DFerranteis a 65year old African-American lady seen today for initial office follow-up visit following hospital admission for stroke in January 2022.  She is accompanied by her daughter.  History is obtained from them and review of hospital electronic medical records and I personally reviewed imaging films in PACS.  She is a 65year old pleasant African-American lady with past medical history of diabetes, hypertension, chronic kidney disease stage IIIa, dysphagia, obesity, s/p gastric sleeve surgery in December 2021.  She was brought in as a code stroke by EMS after she developed altered mental status.  She had a witnessed episodes in which her eyes rolled back in her head twice with brief loss of consciousness.  She is subsequently found to have right-sided weakness.  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  Aspect score was 6 on admission.  Infarct core was 11 to 20 cc only.  After discussion of risk benefits with the patient's family patient was taken for emergent mechanical thrombectomy which was performed by Dr. DEstanislado Pandybut there was underlying MCA stenosis requiring rescue MCA stenting with resultant TICI 2C revascularization.  She was admitted admitted  to the intensive care unit blood pressure tightly controlled.  She was subsequently extubated and found to have global aphasia and right hemiparesis.  MRI scan showed acute infarct involving left MCA territory of basal ganglia as well as left patchy temporal frontal and parietal lobes.  There is small area of hemorrhage noted in the  left posterior temporal lobe.  2D echo showed ejection fraction of 6065%.  Lower extremity venous Dopplers negative for DVT.  LDL cholesterol 68 mg percent.  Hemoglobin A1c was 5.4.  Patient was started on aspirin and Brilinta for MCA stent.  History of dysphagia and had a feeding tube inserted.  She was transferred to inpatient rehab where she stayed for few weeks.  She also started on Keppra for seizure prophylaxis due to her episodes of seizure-like presentation.  EEG she had shown intermittent rhythmic delta slowing in the left frontal region.  Patient has been at home since rehab.  She is to get getting physical occupational and speech therapy which is now down to 1 day a week.  She is able to speak better but still has to speak slowly and occasionally gets stuck and then some word finding difficulties.  She has noticed improvement in right-sided strength and now she can walk independently but needs a walker.  She still has some dragging of the right foot.  Her balance is poor.  However she has had no falls or injuries.  She is able to manage most activities of daily living but needs a little supervision.  She is tolerating aspirin and Brilinta well with only minor bruising and no bleeding.  Blood pressures well controlled today it is elevated in the office at 153/75.  She remains on Lipitor which is tolerating well without muscle aches and pains.    ROS:   14 system review of systems is positive for those listed in HPI and all other systems negative  PMH:  Past Medical History:  Diagnosis Date   Acute ischemic left MCA stroke ([place]) 06/30/2020   Acute ischemic stroke (HCC)    Acute stroke due to occlusion of left middle cerebral artery ([place]) 06/30/2020   Age-related nuclear cataract of both eyes 09/28/2015   Arthralgia of multiple joints 09/28/2015   Asthma    Borderline diabetes    Cerebrovascular accident (CVA) ([place])    Chronic bilateral thoracic back pain 06/05/2018   Chronic renal  insufficiency, stage 1 08/11/2014   Diabetes mellitus type 2 in obese ([place])    Dyslipidemia    Dysphagia, post-stroke    Erosive gastritis 07/20/2019   Formatting of this note might be different from the original. On EGD 07/2019   Esophagitis 07/20/2019   Formatting of this note might be different from the original. Added automatically from request for surgery [id number]  Formatting of this note might be different from the original. On EGD 07/2019   Essential hypertension 09/28/2015   Essential thrombocytosis ([place]) 09/28/2015   Gallstones 08/18/2019   Formatting of this note might be different from the original. 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   Hyperlipidemia, unspecified 03/16/2013   Hypertension    Keratoconjunctivitis sicca of both eyes not specified as Sjogren's 09/28/2015   Left middle cerebral artery stroke ([place]) 07/09/2020   Morbid obesity ([place]) 05/21/2013   Stage 3b chronic kidney disease ([place])    Status post gastric bypass for obesity 09/28/2015    Social History:  Social History   Socioeconomic History   Marital status: Divorced    Spouse name: Not on file   Number of children: Not on file   Years of education: Not on file   Highest education level: Not on file  Occupational History   Not on file  Tobacco Use   Smoking status: Never   Smokeless tobacco: Never  Vaping Use   Vaping Use: Never used  Substance and Sexual Activity   Alcohol use: No   Drug use: No   Sexual activity: Not on file  Other Topics Concern   Not on file  Social History Narrative   Lives with daughter [name]   Right Handed   Drinks no caffeine   Social Determinants of Health   Financial Resource Strain: Not on file  Food Insecurity: Not on file  Transportation Needs: Not on file  Physical Activity: Not on file  Stress: Not on file  Social Connections: Not on file   Intimate Partner Violence: Not on file    Medications:   Current Outpatient Medications on File Prior to Visit  Medication Sig Dispense Refill   atorvastatin (LIPITOR) 80 MG tablet Take 1 tablet (80 mg total) by mouth at bedtime. 30 tablet 0   diphenhydrAMINE (BENADRYL) 50 MG tablet Take 1 tablet (50 mg total) by mouth at bedtime as needed for itching. Take Benadryl 50 mg at 7:30 am on 8/9 (with Prednisone 50 mg.) 30 tablet 0   ELIQUIS 5 MG TABS tablet TAKE 1 TABLET BY MOUTH TWICE A DAY 60 tablet 3   levETIRAcetam (KEPPRA) 500 MG tablet Take 1 tablet (500 mg total) by mouth 2 (two) times daily. 60 tablet 1   Multiple Vitamin (MULTIVITAMIN WITH MINERALS) TABS tablet Take 1 tablet by mouth daily.     omeprazole (PRILOSEC) 40 MG capsule Take 40 mg by mouth daily.     potassium chloride SA (KLOR-CON) 20 MEQ tablet Take 1 tablet (20 mEq total) by mouth daily. 90 tablet 2   predniSONE (DELTASONE) 50 MG tablet Pre medication for contrast allergy. Take 50 mg Prednisone at 7:30 pm on 8/8, at 1:30 am on 8/9; and 7:30 am on 8/9 (with Benadryl 50 mg at 7:30 am.) 3 tablet 0   ticagrelor (BRILINTA) 90 MG TABS tablet Take 1 tablet (90 mg total) by mouth 2 (two) times daily. 180 tablet 3   ursodiol (ACTIGALL) 250 MG tablet Take 250 mg by mouth 2 (two) times daily.     Vitamin D3 (VITAMIN D) 25 MCG tablet TAKE 1 TABLET (1,000 UNITS TOTAL) BY MOUTH DAILY BEFORE BREAKFAST. 60 tablet 0   No current facility-administered medications on file prior to visit.    Allergies:   Allergies  Allergen Reactions   Iodinated Diagnostic Agents Itching   Doxycycline Nausea And Vomiting   Sulfa Antibiotics Rash and Hives   Codeine Hives and Swelling    Swollen tongue   Hydrocodone Bit-[place] Mbr Itching   Ace Inhibitors Cough, Itching and Rash    Physical Exam Today's Vitals   02/13/21 1414  Weight: 157 lb (71.2 kg)  Height: '5\' 2"'$  (1.575 m)   Body mass index is 28.72 kg/m.   General: well developed, well  nourished very pleasant middle-aged African-American lady, seated, in no evident distress Head: head normocephalic and atraumatic.  Neck: supple with no carotid or supraclavicular bruits Cardiovascular: regular rate and rhythm, no murmurs Musculoskeletal: no deformity Skin:  no rash/petichiae Vascular:  Normal pulses all extremities  Neurologic Exam Mental  Status: Awake and fully alert.  Mild to moderate expressive.  Some difficulty following simple step command but overall greatly improved since prior visit.  Oriented to place and person.  Recent memory impaired and remote memory intact. Attention span, concentration and fund of knowledge appropriate during visit. Mood and affect appropriate.   Cranial Nerves: Pupils equal, briskly reactive to light. Extraocular movements full without nystagmus. Visual fields full to confrontation. Hearing intact. Facial sensation intact.  Mild right lower facial asymmetry., tongue, palate moves normally and symmetrically.  Motor: Normal bulk and tone. Normal strength in all tested extremity muscles.  Diminished fine finger movements on the right.  Orbits left over right upper extremity.  Trace right grip and ankle dorsiflexor weakness Sensory.: intact to touch ,pinprick .position and vibratory sensation.  Coordination: Rapid alternating movements normal in all extremities except decreased right hand. Finger-to-nose and heel-to-shin performed accurately bilaterally. Gait and Station: Arises from chair without difficulty. Stance is normal. Gait demonstrates normal stride length with mild right leg stiffness and slightly decreased step height with use of cane.  Tandem walk and heel toe not attempted. Reflexes: 1+ and symmetric. Toes downgoing.       ASSESSMENT/PLAN: 65 year old lady with left MCA infarct due to left MCA occlusion status post mechanical thrombectomy with left MCA rescue stenting in January 2022 with residual aphasia and mild right hemiparesis and  gait abnormality.  S/p ILR which showed evidence of A. fib on 10/05/2020.  Seizure-like activity 9/9.  Vascular risk factors of diabetes, hypertension, obesity and intracranial stenosis.      1.  Seizure activity likely late effect of stroke -Continue Keppra 500 mg twice daily for seizure prevention -refill provided -Obtain EEG -Discussed seizure provoking triggers and importance of avoidance  2. Hx of L MCA stroke -Afib -continue Eliquis 5 mg twice daily and routine follow-up with cardiology -s/p MCA stent -continue Brilinta as advised by IR.  Cerebral angio 01/2021 showed patent left MCA stent. Plans on f/u imaging with IR in 6 months -Aphasia-continue working with SLP for hopeful further recovery. Discussed possible slight worsening post seizure and encouraged her to restart working with therapies -Gait impairment -continue working with PT and use of cane at all times unless otherwise instructed -maintain strict control of hypertension with blood pressure goal below 130/90 and lipids with LDL cholesterol goal below 70 mg/dL.    Follow-up in 4 months or call earlier if needed   CC:  [facility] provider: Dr. Marcene Corning, Moss Mc, PA-C   I spent 38 minutes of face-to-face and non-face-to-face time with patient and brother.  This included previsit chart review, lab review, study review, electronic health record documentation, patient education and discussion regarding recent seizure activity likely from prior stroke, secondary stroke prevention measures and aggressive stroke risk factor management, residual deficits and likely further recovery and answered all other questions to patient and brother satisfaction  Frann Rider, AGNP-BC  [facility] [address] [place], [place] [address]  Phone [phone] Fax [phone] Note: This document was prepared with digital dictation and possible smart phrase technology. Any transcriptional errors that  result from this process are unintentional.

## 2021-02-15 ENCOUNTER — Inpatient Hospital Stay: Payer: Medicare Other | Admitting: Adult Health

## 2021-02-22 LAB — CUP PACEART REMOTE DEVICE CHECK
Date Time Interrogation Session: 20220920111040
Implantable Pulse Generator Implant Date: 20220407

## 2021-02-27 ENCOUNTER — Ambulatory Visit (INDEPENDENT_AMBULATORY_CARE_PROVIDER_SITE_OTHER): Payer: Medicare Other

## 2021-02-27 DIAGNOSIS — I639 Cerebral infarction, unspecified: Secondary | ICD-10-CM | POA: Diagnosis not present

## 2021-03-06 NOTE — Progress Notes (Signed)
Carelink Summary Report / Loop Recorder 

## 2021-03-28 LAB — CUP PACEART REMOTE DEVICE CHECK
Date Time Interrogation Session: 20221023111056
Implantable Pulse Generator Implant Date: 20220407

## 2021-04-03 ENCOUNTER — Ambulatory Visit (INDEPENDENT_AMBULATORY_CARE_PROVIDER_SITE_OTHER): Payer: Medicare Other

## 2021-04-03 DIAGNOSIS — I639 Cerebral infarction, unspecified: Secondary | ICD-10-CM

## 2021-04-10 NOTE — Progress Notes (Signed)
Carelink Summary Report / Loop Recorder 

## 2021-04-19 ENCOUNTER — Inpatient Hospital Stay: Payer: Medicare Other | Admitting: Neurology

## 2021-05-09 DIAGNOSIS — E1169 Type 2 diabetes mellitus with other specified complication: Secondary | ICD-10-CM

## 2021-05-09 DIAGNOSIS — E669 Obesity, unspecified: Secondary | ICD-10-CM

## 2021-05-09 DIAGNOSIS — E785 Hyperlipidemia, unspecified: Secondary | ICD-10-CM

## 2021-05-10 ENCOUNTER — Emergency Department (HOSPITAL_COMMUNITY): Payer: Medicare Other

## 2021-05-10 ENCOUNTER — Inpatient Hospital Stay (HOSPITAL_COMMUNITY)
Admission: EM | Admit: 2021-05-10 | Discharge: 2021-05-12 | DRG: 065 | Disposition: A | Discharge status: Home / Self Care | Payer: Medicare Other | Attending: Student in an Organized Health Care Education/Training Program | Admitting: Student in an Organized Health Care Education/Training Program

## 2021-05-10 ENCOUNTER — Encounter (HOSPITAL_COMMUNITY): Payer: Self-pay | Admitting: Emergency Medicine

## 2021-05-10 DIAGNOSIS — G8191 Hemiplegia, unspecified affecting right dominant side: Secondary | ICD-10-CM | POA: Diagnosis present

## 2021-05-10 DIAGNOSIS — E669 Obesity, unspecified: Secondary | ICD-10-CM | POA: Diagnosis present

## 2021-05-10 DIAGNOSIS — K219 Gastro-esophageal reflux disease without esophagitis: Secondary | ICD-10-CM | POA: Diagnosis present

## 2021-05-10 DIAGNOSIS — R29707 NIHSS score 7: Secondary | ICD-10-CM | POA: Diagnosis present

## 2021-05-10 DIAGNOSIS — G9349 Other encephalopathy: Secondary | ICD-10-CM | POA: Diagnosis present

## 2021-05-10 DIAGNOSIS — Z7901 Long term (current) use of anticoagulants: Secondary | ICD-10-CM

## 2021-05-10 DIAGNOSIS — Z79899 Other long term (current) drug therapy: Secondary | ICD-10-CM

## 2021-05-10 DIAGNOSIS — Z823 Family history of stroke: Secondary | ICD-10-CM

## 2021-05-10 DIAGNOSIS — R4701 Aphasia: Secondary | ICD-10-CM

## 2021-05-10 DIAGNOSIS — I63511 Cerebral infarction due to unspecified occlusion or stenosis of right middle cerebral artery: Secondary | ICD-10-CM

## 2021-05-10 DIAGNOSIS — D631 Anemia in chronic kidney disease: Secondary | ICD-10-CM | POA: Diagnosis present

## 2021-05-10 DIAGNOSIS — Z8673 Personal history of transient ischemic attack (TIA), and cerebral infarction without residual deficits: Secondary | ICD-10-CM

## 2021-05-10 DIAGNOSIS — Z888 Allergy status to other drugs, medicaments and biological substances status: Secondary | ICD-10-CM

## 2021-05-10 DIAGNOSIS — Z91041 Radiographic dye allergy status: Secondary | ICD-10-CM

## 2021-05-10 DIAGNOSIS — I10 Essential (primary) hypertension: Secondary | ICD-10-CM | POA: Diagnosis present

## 2021-05-10 DIAGNOSIS — E1169 Type 2 diabetes mellitus with other specified complication: Secondary | ICD-10-CM | POA: Diagnosis present

## 2021-05-10 DIAGNOSIS — I639 Cerebral infarction, unspecified: Secondary | ICD-10-CM | POA: Diagnosis present

## 2021-05-10 DIAGNOSIS — Z885 Allergy status to narcotic agent status: Secondary | ICD-10-CM

## 2021-05-10 DIAGNOSIS — I69320 Aphasia following cerebral infarction: Secondary | ICD-10-CM

## 2021-05-10 DIAGNOSIS — I679 Cerebrovascular disease, unspecified: Secondary | ICD-10-CM

## 2021-05-10 DIAGNOSIS — G40909 Epilepsy, unspecified, not intractable, without status epilepticus: Secondary | ICD-10-CM | POA: Diagnosis present

## 2021-05-10 DIAGNOSIS — Z882 Allergy status to sulfonamides status: Secondary | ICD-10-CM

## 2021-05-10 DIAGNOSIS — I63441 Cerebral infarction due to embolism of right cerebellar artery: Secondary | ICD-10-CM | POA: Diagnosis present

## 2021-05-10 DIAGNOSIS — E1122 Type 2 diabetes mellitus with diabetic chronic kidney disease: Secondary | ICD-10-CM | POA: Diagnosis present

## 2021-05-10 DIAGNOSIS — Z20822 Contact with and (suspected) exposure to covid-19: Secondary | ICD-10-CM | POA: Diagnosis present

## 2021-05-10 DIAGNOSIS — J45909 Unspecified asthma, uncomplicated: Secondary | ICD-10-CM | POA: Diagnosis present

## 2021-05-10 DIAGNOSIS — I6389 Other cerebral infarction: Principal | ICD-10-CM | POA: Diagnosis present

## 2021-05-10 DIAGNOSIS — N1831 Chronic kidney disease, stage 3a: Secondary | ICD-10-CM | POA: Diagnosis present

## 2021-05-10 DIAGNOSIS — R519 Headache, unspecified: Secondary | ICD-10-CM

## 2021-05-10 DIAGNOSIS — N1832 Chronic kidney disease, stage 3b: Secondary | ICD-10-CM | POA: Diagnosis present

## 2021-05-10 DIAGNOSIS — N179 Acute kidney failure, unspecified: Secondary | ICD-10-CM | POA: Diagnosis present

## 2021-05-10 DIAGNOSIS — Z9884 Bariatric surgery status: Secondary | ICD-10-CM

## 2021-05-10 DIAGNOSIS — I63512 Cerebral infarction due to unspecified occlusion or stenosis of left middle cerebral artery: Secondary | ICD-10-CM | POA: Diagnosis present

## 2021-05-10 DIAGNOSIS — E785 Hyperlipidemia, unspecified: Secondary | ICD-10-CM | POA: Diagnosis present

## 2021-05-10 DIAGNOSIS — I4891 Unspecified atrial fibrillation: Secondary | ICD-10-CM | POA: Diagnosis present

## 2021-05-10 DIAGNOSIS — I129 Hypertensive chronic kidney disease with stage 1 through stage 4 chronic kidney disease, or unspecified chronic kidney disease: Secondary | ICD-10-CM | POA: Diagnosis present

## 2021-05-10 DIAGNOSIS — Z881 Allergy status to other antibiotic agents status: Secondary | ICD-10-CM

## 2021-05-10 LAB — DIFFERENTIAL
Abs Immature Granulocytes: 0.01 10*3/uL (ref 0.00–0.07)
Basophils Absolute: 0 10*3/uL (ref 0.0–0.1)
Basophils Relative: 1 %
Eosinophils Absolute: 0.1 10*3/uL (ref 0.0–0.5)
Eosinophils Relative: 2 %
Immature Granulocytes: 0 %
Lymphocytes Relative: 45 %
Lymphs Abs: 2.1 10*3/uL (ref 0.7–4.0)
Monocytes Absolute: 0.4 10*3/uL (ref 0.1–1.0)
Monocytes Relative: 9 %
Neutro Abs: 2 10*3/uL (ref 1.7–7.7)
Neutrophils Relative %: 43 %

## 2021-05-10 LAB — CBC
HCT: 33.4 % — ABNORMAL LOW (ref 36.0–46.0)
Hemoglobin: 11.1 g/dL — ABNORMAL LOW (ref 12.0–15.0)
MCH: 28.4 pg (ref 26.0–34.0)
MCHC: 33.2 g/dL (ref 30.0–36.0)
MCV: 85.4 fL (ref 80.0–100.0)
Platelets: 366 10*3/uL (ref 150–400)
RBC: 3.91 MIL/uL (ref 3.87–5.11)
RDW: 15.9 % — ABNORMAL HIGH (ref 11.5–15.5)
WBC: 4.5 10*3/uL (ref 4.0–10.5)
nRBC: 0 % (ref 0.0–0.2)

## 2021-05-10 LAB — COMPREHENSIVE METABOLIC PANEL
ALT: 14 U/L (ref 0–44)
AST: 18 U/L (ref 15–41)
Albumin: 3.6 g/dL (ref 3.5–5.0)
Alkaline Phosphatase: 135 U/L — ABNORMAL HIGH (ref 38–126)
Anion gap: 5 (ref 5–15)
BUN: 21 mg/dL (ref 8–23)
CO2: 17 mmol/L — ABNORMAL LOW (ref 22–32)
Calcium: 9.2 mg/dL (ref 8.9–10.3)
Chloride: 114 mmol/L — ABNORMAL HIGH (ref 98–111)
Creatinine, Ser: 1.82 mg/dL — ABNORMAL HIGH (ref 0.44–1.00)
GFR, Estimated: 30 mL/min — ABNORMAL LOW (ref >60)
Glucose, Bld: 84 mg/dL (ref 70–99)
Potassium: 5.1 mmol/L (ref 3.5–5.1)
Sodium: 136 mmol/L (ref 135–145)
Total Bilirubin: 0.9 mg/dL (ref 0.3–1.2)
Total Protein: 6.7 g/dL (ref 6.5–8.1)

## 2021-05-10 LAB — I-STAT CHEM 8, ED
BUN: 26 mg/dL — ABNORMAL HIGH (ref 8–23)
Calcium, Ion: 1.31 mmol/L (ref 1.15–1.40)
Chloride: 114 mmol/L — ABNORMAL HIGH (ref 98–111)
Creatinine, Ser: 2 mg/dL — ABNORMAL HIGH (ref 0.44–1.00)
Glucose, Bld: 80 mg/dL (ref 70–99)
HCT: 34 % — ABNORMAL LOW (ref 36.0–46.0)
Hemoglobin: 11.6 g/dL — ABNORMAL LOW (ref 12.0–15.0)
Potassium: 5.2 mmol/L — ABNORMAL HIGH (ref 3.5–5.1)
Sodium: 138 mmol/L (ref 135–145)
TCO2: 19 mmol/L — ABNORMAL LOW (ref 22–32)

## 2021-05-10 LAB — CBG MONITORING, ED
Glucose-Capillary: 62 mg/dL — ABNORMAL LOW (ref 70–99)
Glucose-Capillary: 70 mg/dL (ref 70–99)

## 2021-05-10 LAB — MAGNESIUM: Magnesium: 1.7 mg/dL (ref 1.7–2.4)

## 2021-05-10 LAB — GLUCOSE, CAPILLARY
Glucose-Capillary: 133 mg/dL — ABNORMAL HIGH (ref 70–99)
Glucose-Capillary: 69 mg/dL — ABNORMAL LOW (ref 70–99)

## 2021-05-10 LAB — PROTIME-INR
INR: 1.2 (ref 0.8–1.2)
Prothrombin Time: 15.4 seconds — ABNORMAL HIGH (ref 11.4–15.2)

## 2021-05-10 LAB — RESP PANEL BY RT-PCR (FLU A&B, COVID) ARPGX2
Influenza A by PCR: NEGATIVE
Influenza B by PCR: NEGATIVE
SARS Coronavirus 2 by RT PCR: NEGATIVE

## 2021-05-10 LAB — APTT: aPTT: 35 seconds (ref 24–36)

## 2021-05-10 IMAGING — CT CT HEAD W/O CM
3 series · 15 of 47 positions shown, 18 images · non-contrast
Comparison: [DATE]

CLINICAL DATA: Neurological deficit, acute, stroke suspected.
Aphasia and confusion.

EXAM:
CT HEAD WITHOUT CONTRAST
TECHNIQUE: Contiguous axial images were obtained from the base of the skull
through the vertex without intravenous contrast.

[Series 3: head 5.0 h31s · axial · 0.46mm/px · z∈[-119,+21]mm · 9 of 34 slices shown, 12 images]
[im 3/34  brain]
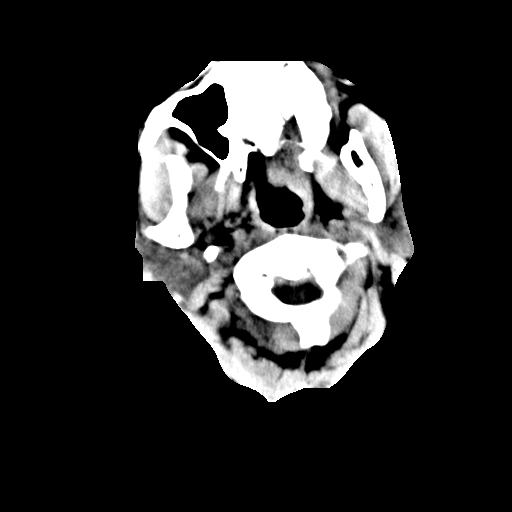
[im 3/34  bone]
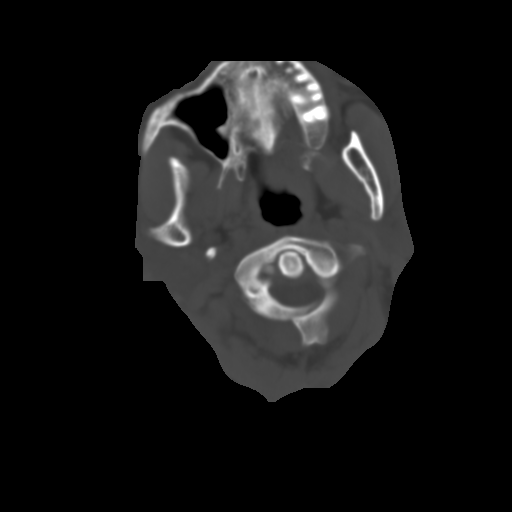
[im 6/34  brain]
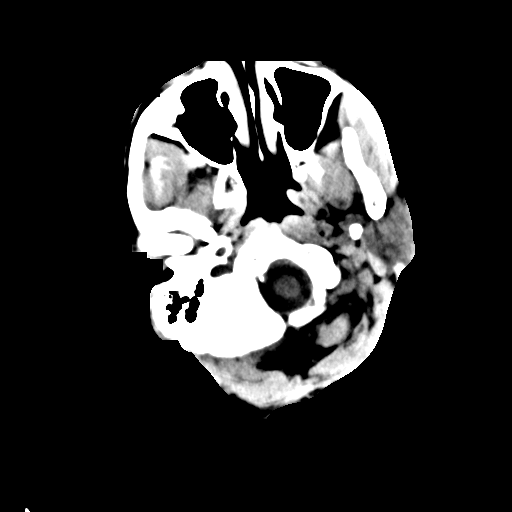
[im 10/34  brain]
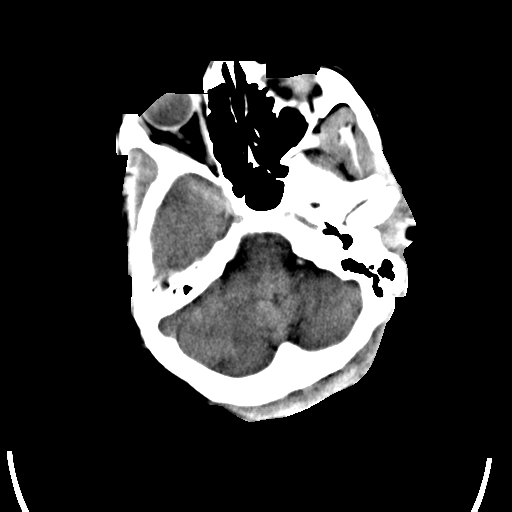
[im 13/34  brain]
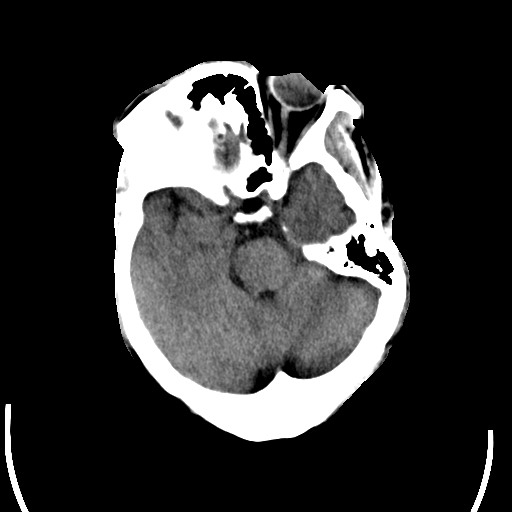
[im 18/34  brain]
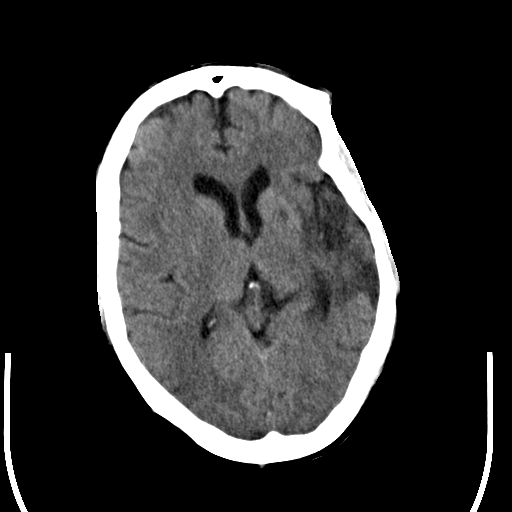
[im 18/34  bone]
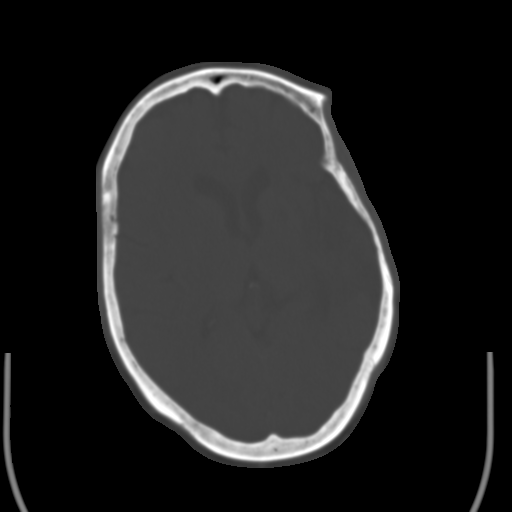
[im 21/34  brain]
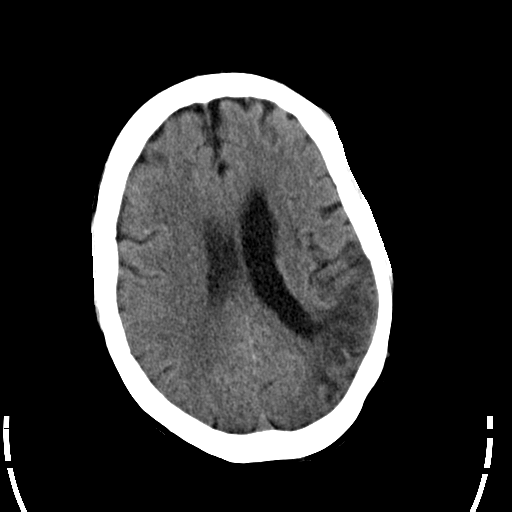
[im 24/34  brain]
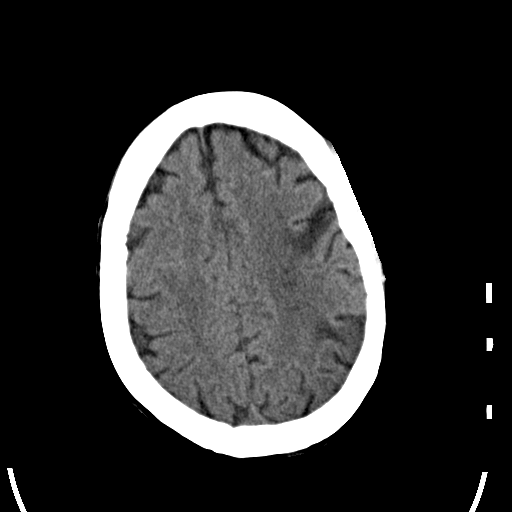
[im 28/34  brain]
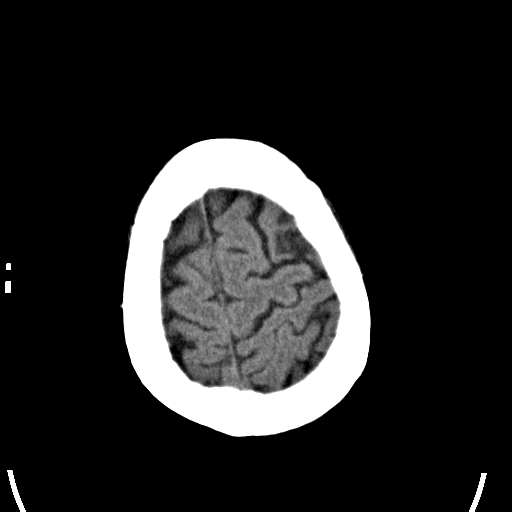
[im 31/34  brain]
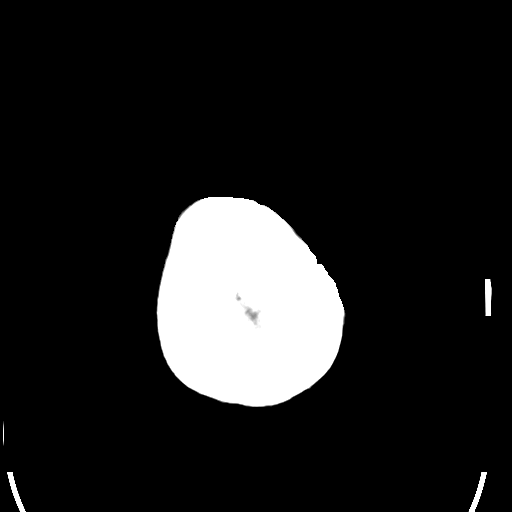
[im 31/34  bone]
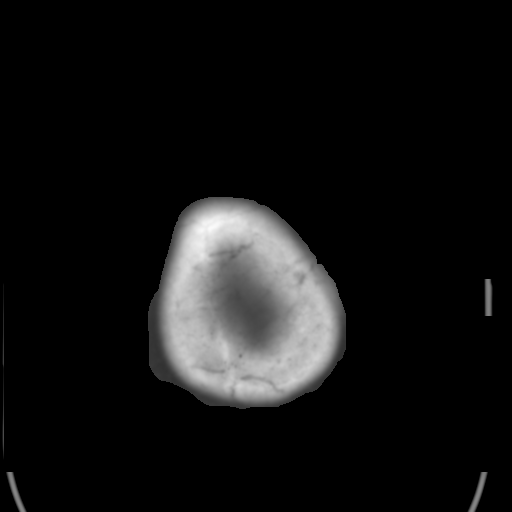

[Series 5: head 3.0 mpr cor · coronal · 0.40mm/px · 3 of 98 slices shown]
[im 33/98  brain]
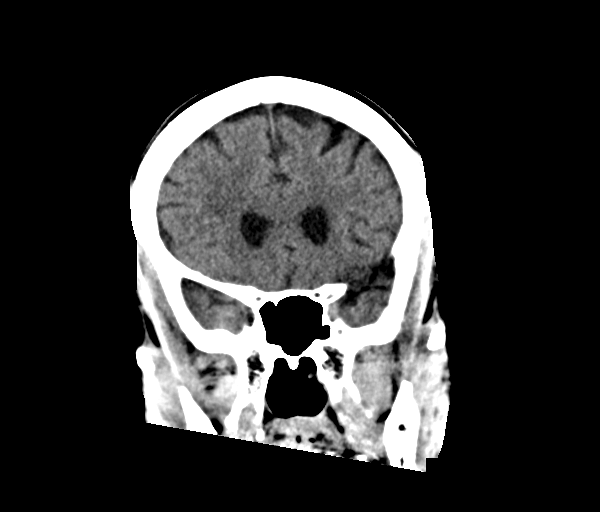
[im 44/98  brain]
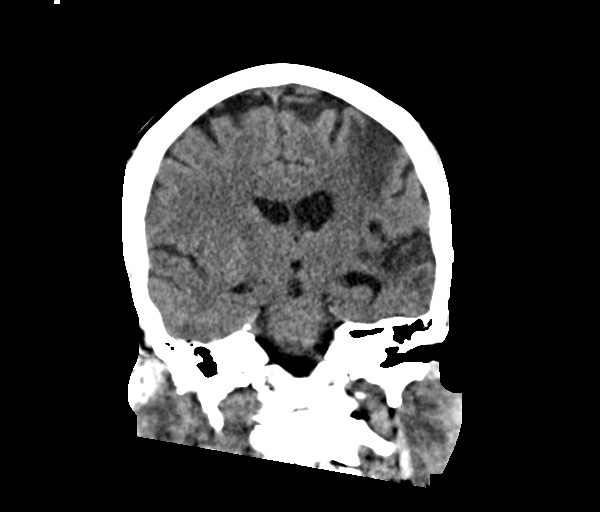
[im 54/98  brain]
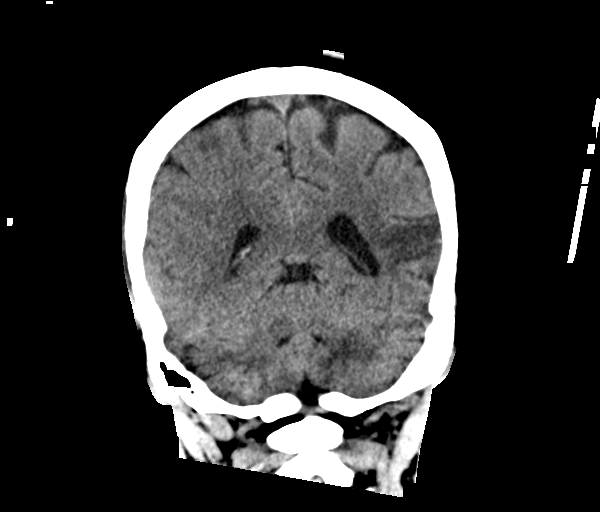

[Series 6: head 3.0 mpr sag · sagittal · 0.51mm/px · 3 of 73 slices shown]
[im 32/73  brain]
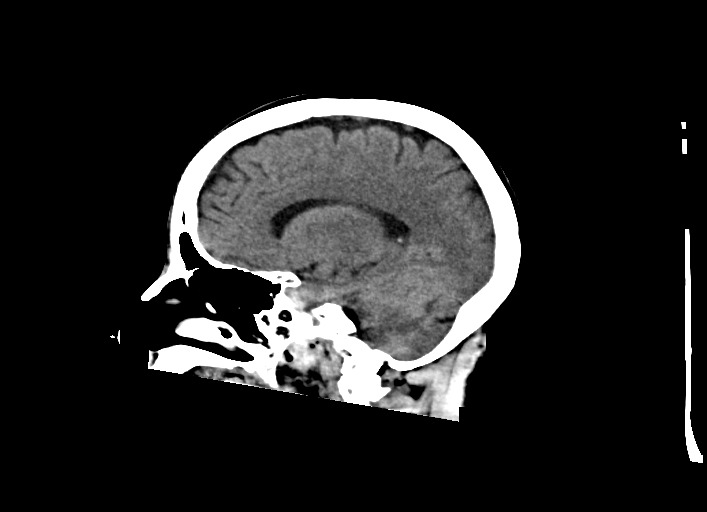
[im 37/73  brain]
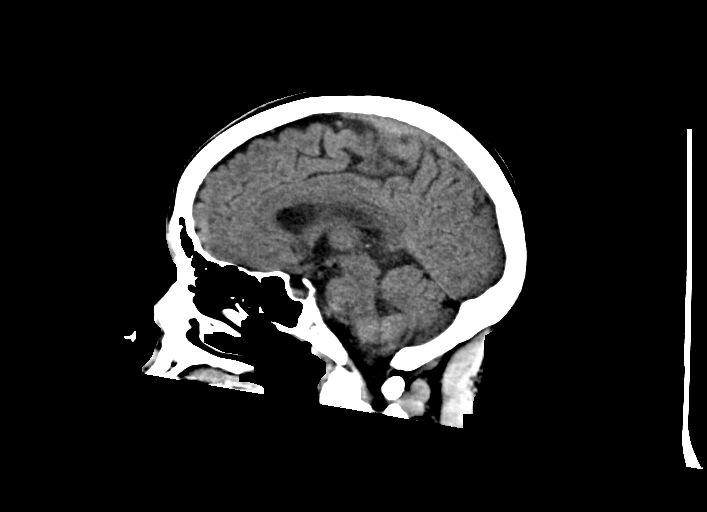
[im 42/73  brain]
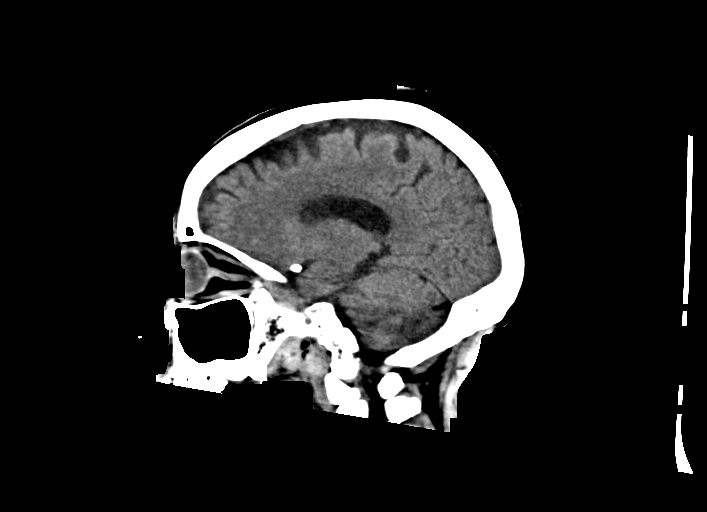

[15 of 47 positions shown; findings below may reference images not displayed]

FINDINGS: Brain: Previous left middle cerebral artery stent with old
infarction within the left middle cerebral artery territory as seen
previously. There is newly seen loss of gray-white differentiation
at the right temporoparietal junction. There could be acute
infarction in this location. No sign of hemorrhage or mass effect.
No hydrocephalus or extra-axial fluid collection.

Vascular: Left MCA stent as noted above.

Skull: Negative

Sinuses/Orbits: Clear/normal

Other: None
IMPRESSION: Old left MCA territory strokes. Newly seen loss of gray-white
differentiation at the right temporoparietal junction suggesting
acute infarction in that region today. No hemorrhage or mass effect.

## 2021-05-10 IMAGING — MR MR MRA HEAD W/O CM
1 series · 16 of 48 positions shown · non-contrast
Comparison: Prior head CT from earlier the same day as well as
earlier studies.

CLINICAL DATA: Initial evaluation for neuro deficit, stroke
suspected. Abnormal CT.



[Series 5: 3d cow · axial · 0.5mm · 0.41mm/px · z∈[-103,-24]mm · 16 of 172 slices shown]
[im 1/172]
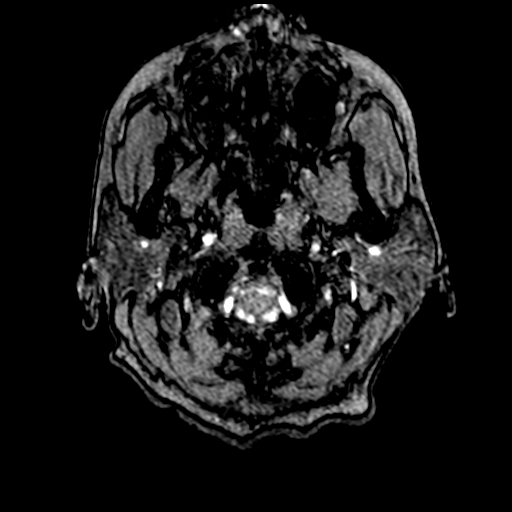
[im 4/172]
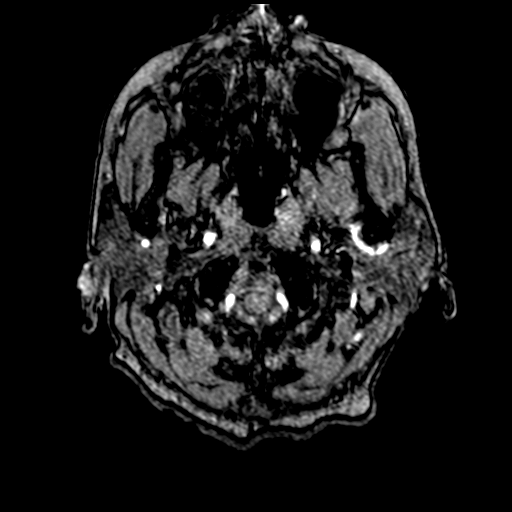
[im 8/172]
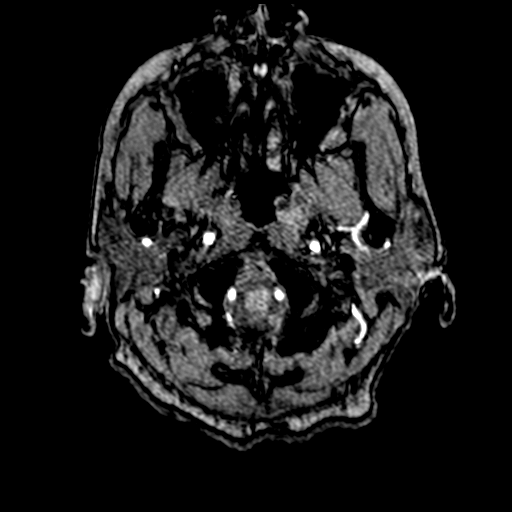
[im 11/172]
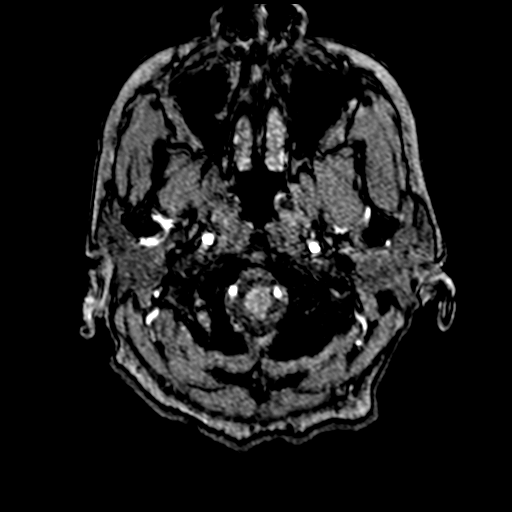
[im 15/172]
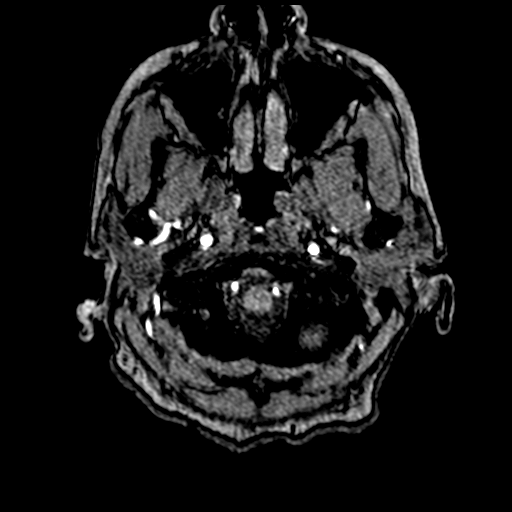
[im 19/172]
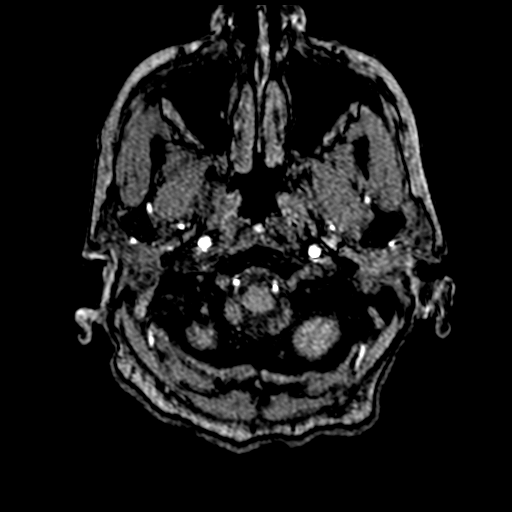
[im 30/172]
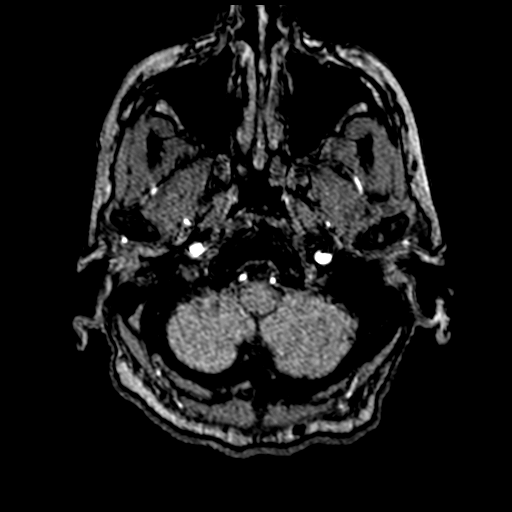
[im 33/172]
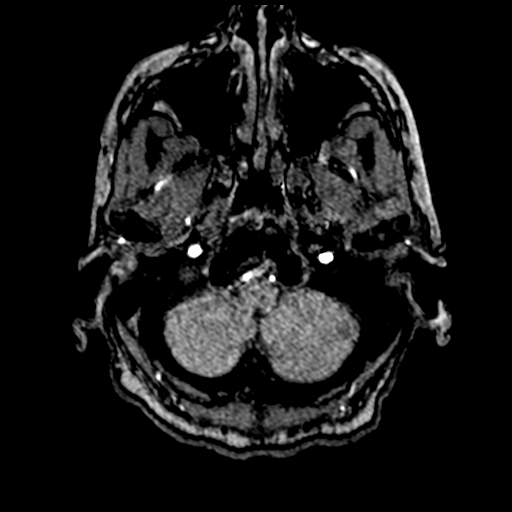
[im 55/172]
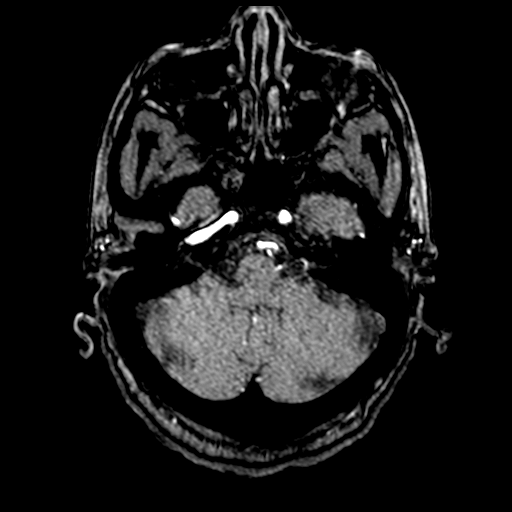
[im 77/172]
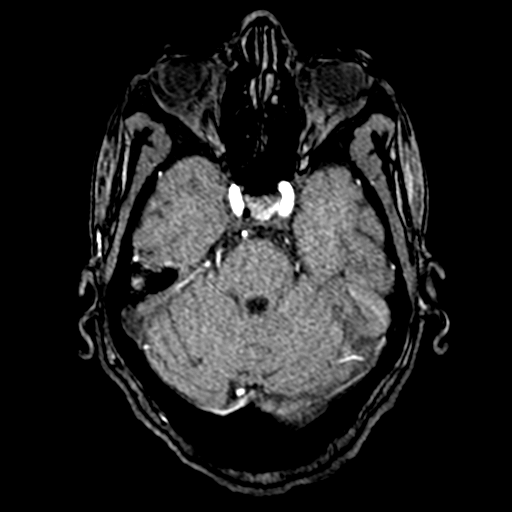
[im 88/172]
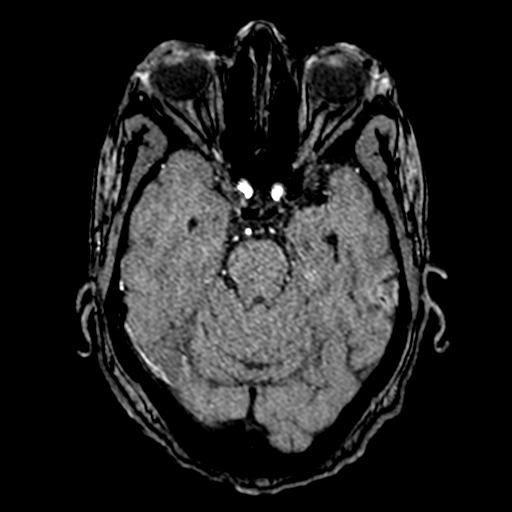
[im 99/172]
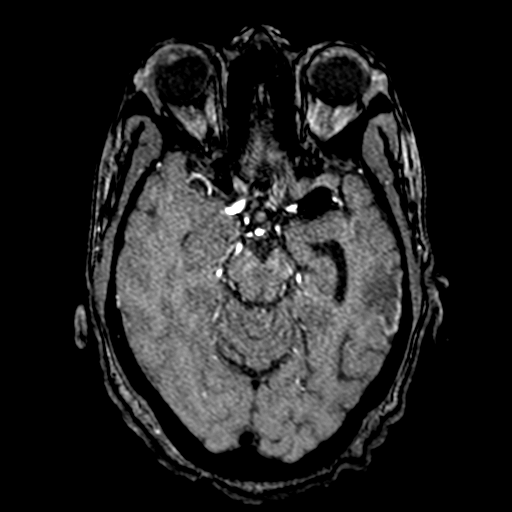
[im 121/172]
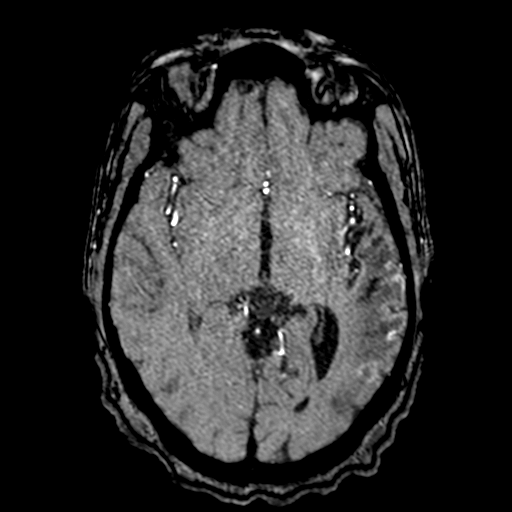
[im 142/172]
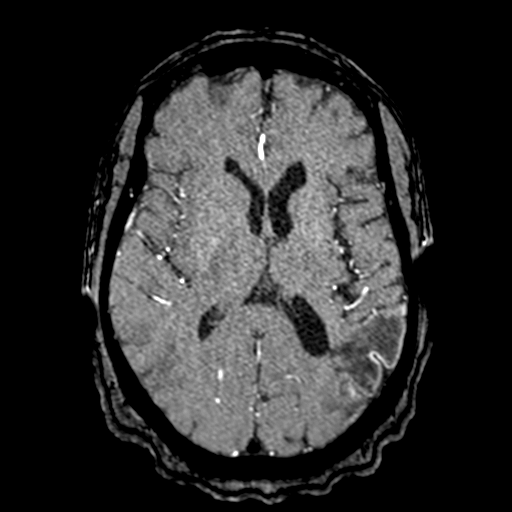
[im 146/172]
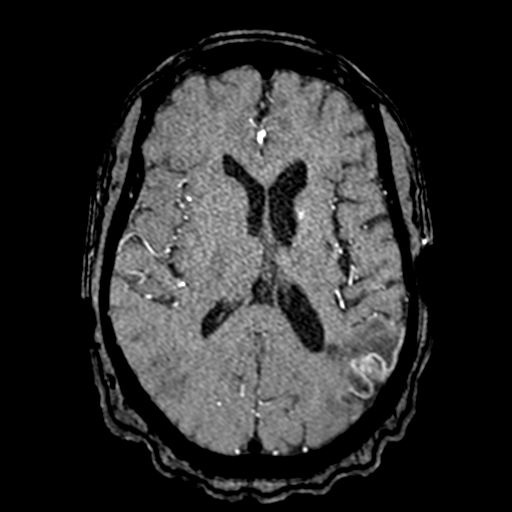
[im 164/172]
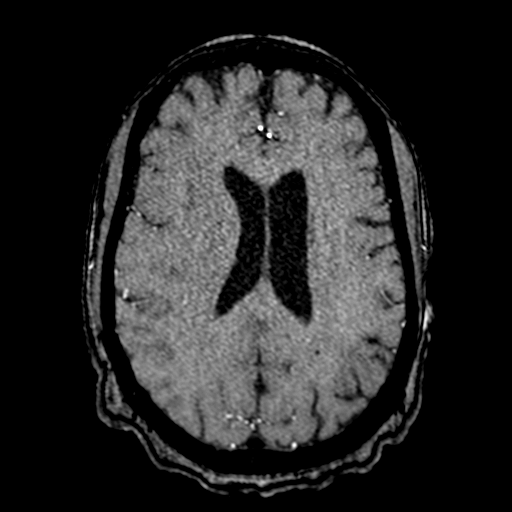

[16 of 48 positions shown; findings below may reference images not displayed]

FINDINGS: MRI HEAD FINDINGS

Brain: Cerebral volume within normal limits for age. Chronic
encephalomalacia and gliosis involving the left cerebral hemisphere
consistent with chronic left MCA distribution infarcts. Scattered
areas of chronic hemosiderin staining and/or laminar necrosis noted
throughout these areas. Small remote right cerebellar infarct noted.
Underlying mild chronic small vessel ischemic disease.

Abnormal restricted diffusion involving the right temporal occipital
junction consistent with an acute ischemic infarct, posterior right
MCA distribution (series 5, image 72). Finding corresponds with
abnormality on prior CT. No associated hemorrhage or mass effect.
Few additional scattered small volume foci of diffusion abnormality
involving the left frontal lobe (series 5, image 84) and left
periatrial region (series 5, image 73) favored to be artifactual
nature related underlying laminar necrosis and/or chronic blood
products.

Otherwise, gray-white matter differentiation maintained. No other
areas of acute infarction. No acute intracranial hemorrhage. 12 mm
benign appearing pineal cyst noted. No other mass lesion. Mild right
left deviation related to chronic left cerebral volume loss. No
hydrocephalus or extra-axial fluid collection. Pituitary gland and
suprasellar region normal. Midline structures intact.

Vascular: Major intracranial vascular flow voids maintained.
Vascular stent in place within the left M1 segment.

Skull and upper cervical spine: Craniocervical junction within
normal limits. Bone marrow signal intensity diffusely decreased on
T1 weighted sequence, nonspecific, but most commonly related to
anemia, smoking or obesity. No focal marrow replacing lesion. No
scalp soft tissue abnormality.

Sinuses/Orbits: Globes and orbital soft tissues within normal
limits. Paranasal sinuses are clear. No mastoid effusion.

Other: None.

MRA HEAD FINDINGS

ANTERIOR CIRCULATION:

Visualized distal cervical segments of the internal carotid arteries
are patent with antegrade flow. Petrous segments patent bilaterally.
Cavernous and supraclinoid left ICA widely patent. Short-segment
approximate 50% stenosis at the supraclinoid right ICA, grossly
similar to prior arteriogram (series [IR], image 10).

Right M1 segment remains patent. Normal right MCA bifurcation.
Probable severe proximal right M2 stenosis with associated flow gap
(series [IR], image 15). This is similar as compared to prior MRA.
Right MCA branches remain perfused distally, although demonstrates
small vessel atheromatous irregularity.

Vascular stent in place within the left M1 segment. Flow through the
stent itself not evaluated by MRA. Probable short-segment severe
left M2 stenosis, seen on prior MRA (series 5, image 113). Left MCA
branches otherwise patent distally.

A1 segments patent bilaterally. Normal anterior communicating artery
complex. Anterior cerebral arteries remain widely patent.

POSTERIOR CIRCULATION:

Both vertebral arteries remain widely patent to the vertebrobasilar
junction. Right PICA origin patent. Left PICA not seen. Basilar
patent to its distal aspect without stenosis. Superior cerebellar
arteries patent bilaterally. Both PCA supplied via hypoplastic P1
segments and robust bilateral posterior communicating arteries. PCAs
remain widely patent to their distal aspects.

No aneurysm.

MRA NECK FINDINGS

AORTIC ARCH: Visualized aortic arch normal caliber. Bovine branching
pattern noted at the aortic arch. No hemodynamically significant
stenosis about the origin of the great vessels.

RIGHT CAROTID SYSTEM: Right common and internal carotid arteries
patent without stenosis or evidence for dissection. Mild for age
atheromatous irregularity about the right bifurcation without
stenosis.

LEFT CAROTID SYSTEM: Left common and internal carotid arteries
patent without stenosis or evidence for dissection. Mild for age
atheromatous irregularity about the left bifurcation without
stenosis.

VERTEBRAL ARTERIES: Both vertebral arteries arise from subclavian
arteries. Vertebral arteries are largely codominant and widely
patent without stenosis or evidence for dissection.
IMPRESSION: MRI HEAD:

1. Acute ischemic nonhemorrhagic infarct involving the right
temporoccipital junction, posterior right MCA distribution.
2. Chronic left MCA territory infarcts, stable from prior.
3. Additional small remote right cerebellar infarct.
4. Underlying age-related cerebral atrophy with mild chronic small
vessel ischemic disease.

MRA HEAD:

1. Negative intracranial MRA for large vessel occlusion.
2. Approximate 50% stenosis at the supraclinoid right ICA, with
probable additional short-segment severe proximal right M2 stenosis.
3. Vascular stent in place within the left M1 segment. Flow through
the stent itself not evaluated by MRA, although patent flow seen
distal to the stent.
4. Short-segment severe left M2 stenosis, similar to previous.
5. Wide patency of the posterior circulation.

MRA NECK:

Wide patency of both carotid artery systems and vertebral arteries
within the neck. No hemodynamically significant stenosis or other
acute vascular abnormality.

## 2021-05-10 IMAGING — MR MR HEAD W/O CM
12 of 13 series · 41 of 48 positions shown · non-contrast
Comparison: Prior head CT from earlier the same day as well as
earlier studies.

CLINICAL DATA: Initial evaluation for neuro deficit, stroke
suspected. Abnormal CT.



[Series 5: DWI · axial · 3.0mm · 0.88mm/px · z∈[-108,+29]mm · 7 of 96 slices shown (1 of 4)]
[im 1/96]
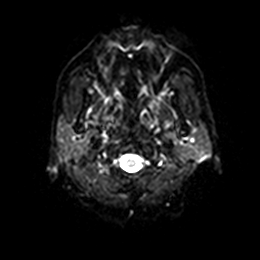
[im 16/96]
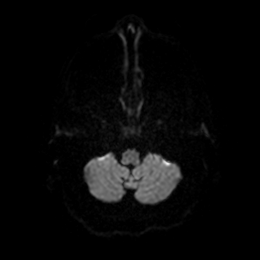
[im 32/96]
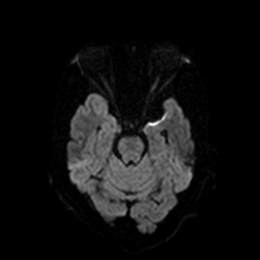
[im 48/96]
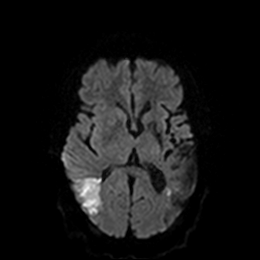
[im 64/96]
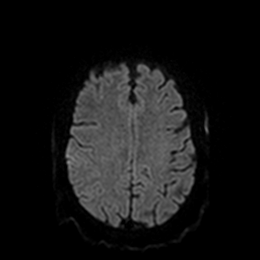
[im 80/96]
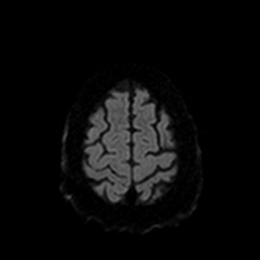
[im 96/96]
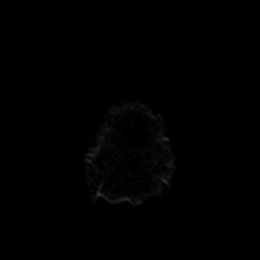

[Series 6: DWI · axial · 3.0mm · 0.88mm/px · z∈[-108,+29]mm · 4 of 48 slices shown (2 of 4)]
[im 1/48]
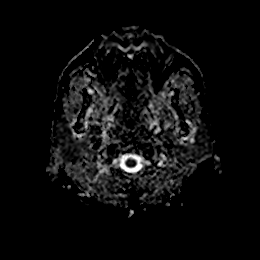
[im 16/48]
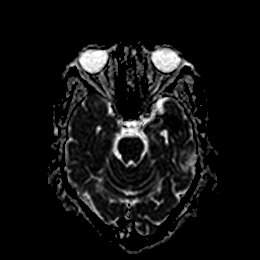
[im 32/48]
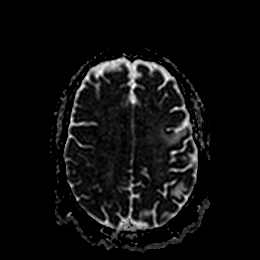
[im 48/48]
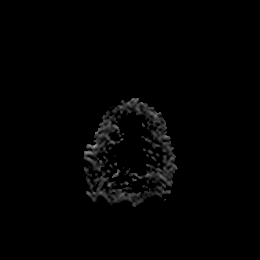

[Series 7: DWI · coronal · 4.0mm · 0.88mm/px · 6 of 70 slices shown (3 of 4)]
[im 1/70]
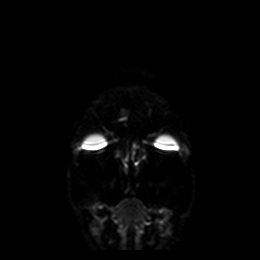
[im 14/70]
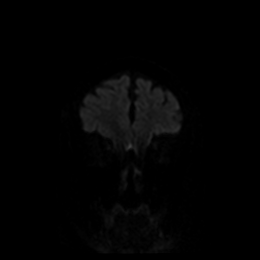
[im 28/70]
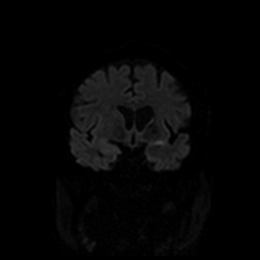
[im 42/70]
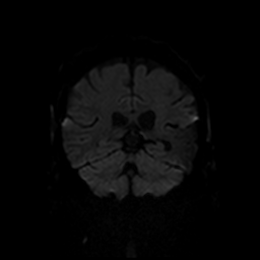
[im 56/70]
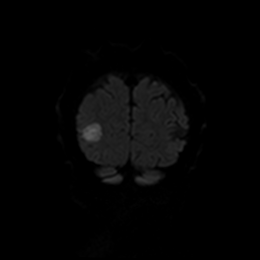
[im 70/70]
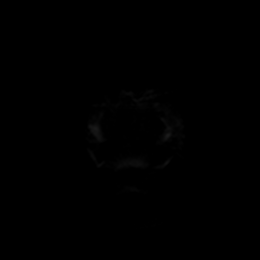

[Series 8: DWI · coronal · 4.0mm · 0.88mm/px · 3 of 35 slices shown (4 of 4)]
[im 1/35]
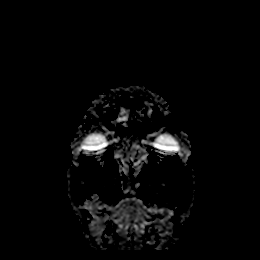
[im 18/35]
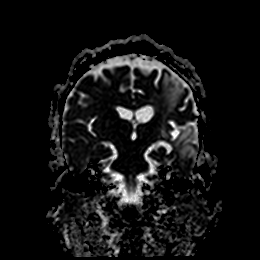
[im 35/35]
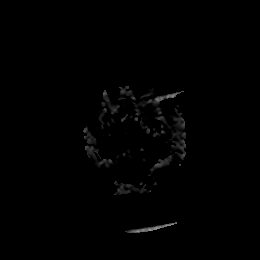

[Series 9: T1 · sagittal · 5.0mm · 0.75mm/px · 2 of 23 slices shown]
[im 1/23]
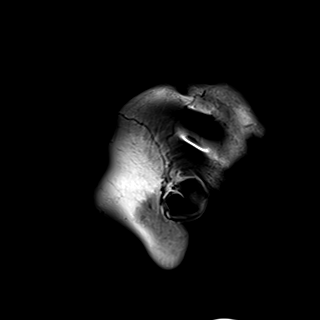
[im 23/23]
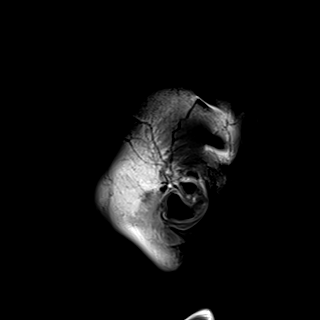

[Series 10: T2 · axial · 5.0mm · 0.72mm/px · z∈[-110,+30]mm · 2 of 25 slices shown (1 of 2)]
[im 1/25]
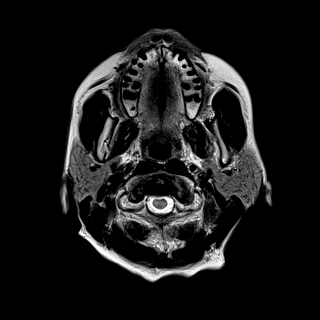
[im 25/25]
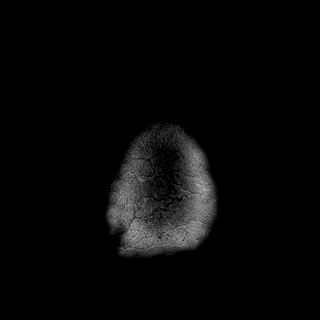

[Series 11: FLAIR · axial · 5.0mm · 0.45mm/px · z∈[-111,+29]mm · 2 of 25 slices shown]
[im 1/25]
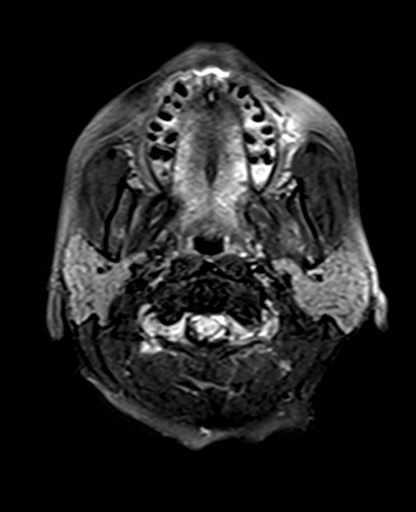
[im 25/25]
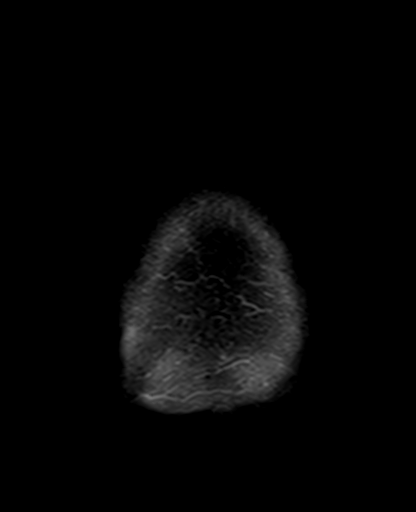

[Series 12: mag_images · axial · 3.0mm · 0.90mm/px · z∈[-116,+33]mm · 4 of 52 slices shown]
[im 1/52]
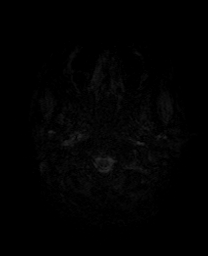
[im 18/52]
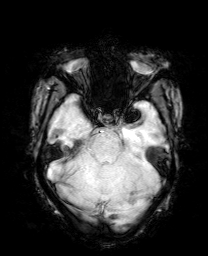
[im 35/52]
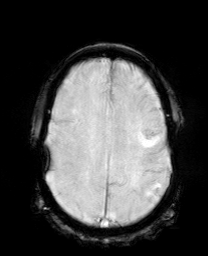
[im 52/52]
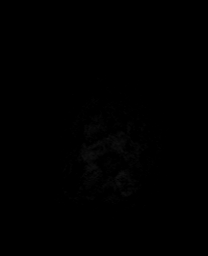

[Series 13: pha_images · axial · 3.0mm · 0.90mm/px · z∈[-116,+33]mm · 4 of 52 slices shown]
[im 1/52]
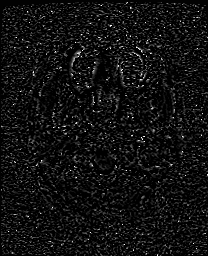
[im 18/52]
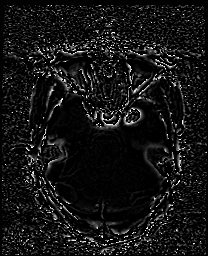
[im 35/52]
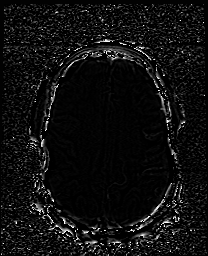
[im 52/52]
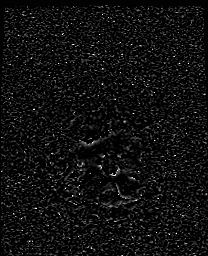

[Series 14: swi_images · axial · 3.0mm · 0.90mm/px · z∈[-116,+33]mm · 4 of 52 slices shown]
[im 1/52]
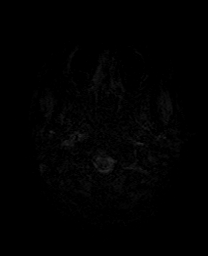
[im 18/52]
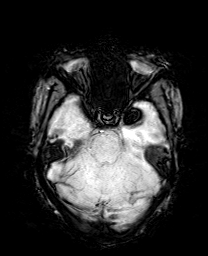
[im 35/52]
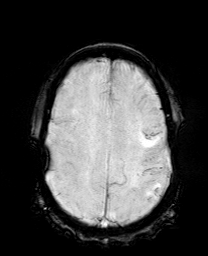
[im 52/52]
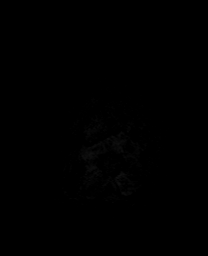

[Series 15: mip_images(sw) · axial · 24.0mm · 0.90mm/px · 1 of 45 slices shown]
[im 1/45]
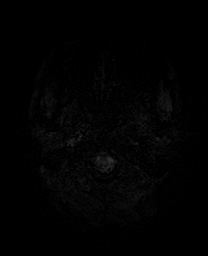

[Series 17: T2 · coronal · 5.0mm · 0.34mm/px · 2 of 29 slices shown (2 of 2)]
[im 1/29]
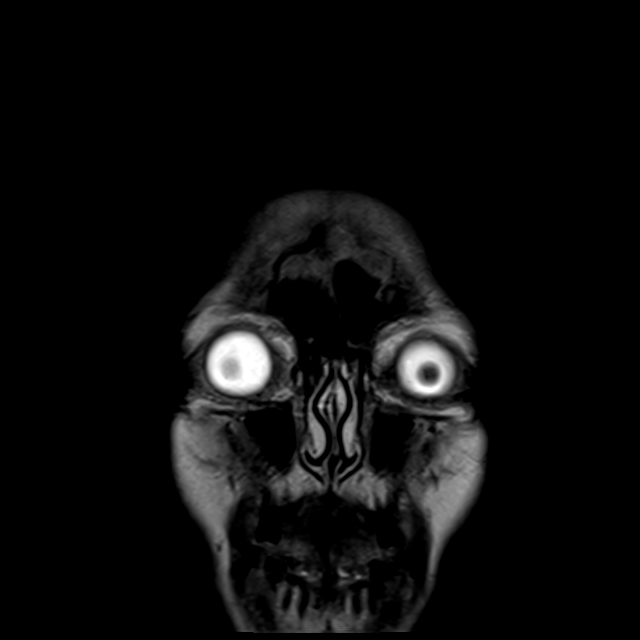
[im 29/29]
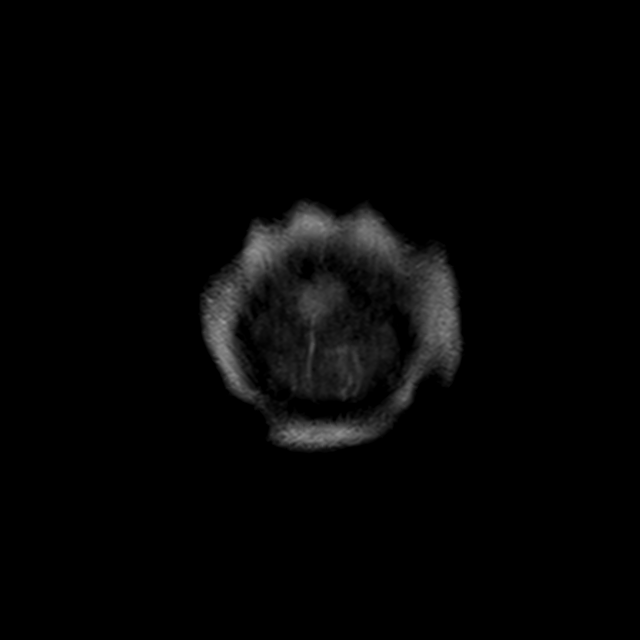

[41 of 48 positions shown; findings below may reference images not displayed]

FINDINGS: MRI HEAD FINDINGS

Brain: Cerebral volume within normal limits for age. Chronic
encephalomalacia and gliosis involving the left cerebral hemisphere
consistent with chronic left MCA distribution infarcts. Scattered
areas of chronic hemosiderin staining and/or laminar necrosis noted
throughout these areas. Small remote right cerebellar infarct noted.
Underlying mild chronic small vessel ischemic disease.

Abnormal restricted diffusion involving the right temporal occipital
junction consistent with an acute ischemic infarct, posterior right
MCA distribution (series 5, image 72). Finding corresponds with
abnormality on prior CT. No associated hemorrhage or mass effect.
Few additional scattered small volume foci of diffusion abnormality
involving the left frontal lobe (series 5, image 84) and left
periatrial region (series 5, image 73) favored to be artifactual
nature related underlying laminar necrosis and/or chronic blood
products.

Otherwise, gray-white matter differentiation maintained. No other
areas of acute infarction. No acute intracranial hemorrhage. 12 mm
benign appearing pineal cyst noted. No other mass lesion. Mild right
left deviation related to chronic left cerebral volume loss. No
hydrocephalus or extra-axial fluid collection. Pituitary gland and
suprasellar region normal. Midline structures intact.

Vascular: Major intracranial vascular flow voids maintained.
Vascular stent in place within the left M1 segment.

Skull and upper cervical spine: Craniocervical junction within
normal limits. Bone marrow signal intensity diffusely decreased on
T1 weighted sequence, nonspecific, but most commonly related to
anemia, smoking or obesity. No focal marrow replacing lesion. No
scalp soft tissue abnormality.

Sinuses/Orbits: Globes and orbital soft tissues within normal
limits. Paranasal sinuses are clear. No mastoid effusion.

Other: None.

MRA HEAD FINDINGS

ANTERIOR CIRCULATION:

Visualized distal cervical segments of the internal carotid arteries
are patent with antegrade flow. Petrous segments patent bilaterally.
Cavernous and supraclinoid left ICA widely patent. Short-segment
approximate 50% stenosis at the supraclinoid right ICA, grossly
similar to prior arteriogram (series [IR], image 10).

Right M1 segment remains patent. Normal right MCA bifurcation.
Probable severe proximal right M2 stenosis with associated flow gap
(series [IR], image 15). This is similar as compared to prior MRA.
Right MCA branches remain perfused distally, although demonstrates
small vessel atheromatous irregularity.

Vascular stent in place within the left M1 segment. Flow through the
stent itself not evaluated by MRA. Probable short-segment severe
left M2 stenosis, seen on prior MRA (series 5, image 113). Left MCA
branches otherwise patent distally.

A1 segments patent bilaterally. Normal anterior communicating artery
complex. Anterior cerebral arteries remain widely patent.

POSTERIOR CIRCULATION:

Both vertebral arteries remain widely patent to the vertebrobasilar
junction. Right PICA origin patent. Left PICA not seen. Basilar
patent to its distal aspect without stenosis. Superior cerebellar
arteries patent bilaterally. Both PCA supplied via hypoplastic P1
segments and robust bilateral posterior communicating arteries. PCAs
remain widely patent to their distal aspects.

No aneurysm.

MRA NECK FINDINGS

AORTIC ARCH: Visualized aortic arch normal caliber. Bovine branching
pattern noted at the aortic arch. No hemodynamically significant
stenosis about the origin of the great vessels.

RIGHT CAROTID SYSTEM: Right common and internal carotid arteries
patent without stenosis or evidence for dissection. Mild for age
atheromatous irregularity about the right bifurcation without
stenosis.

LEFT CAROTID SYSTEM: Left common and internal carotid arteries
patent without stenosis or evidence for dissection. Mild for age
atheromatous irregularity about the left bifurcation without
stenosis.

VERTEBRAL ARTERIES: Both vertebral arteries arise from subclavian
arteries. Vertebral arteries are largely codominant and widely
patent without stenosis or evidence for dissection.
IMPRESSION: MRI HEAD:

1. Acute ischemic nonhemorrhagic infarct involving the right
temporoccipital junction, posterior right MCA distribution.
2. Chronic left MCA territory infarcts, stable from prior.
3. Additional small remote right cerebellar infarct.
4. Underlying age-related cerebral atrophy with mild chronic small
vessel ischemic disease.

MRA HEAD:

1. Negative intracranial MRA for large vessel occlusion.
2. Approximate 50% stenosis at the supraclinoid right ICA, with
probable additional short-segment severe proximal right M2 stenosis.
3. Vascular stent in place within the left M1 segment. Flow through
the stent itself not evaluated by MRA, although patent flow seen
distal to the stent.
4. Short-segment severe left M2 stenosis, similar to previous.
5. Wide patency of the posterior circulation.

MRA NECK:

Wide patency of both carotid artery systems and vertebral arteries
within the neck. No hemodynamically significant stenosis or other
acute vascular abnormality.

## 2021-05-10 IMAGING — MR MR MRA NECK W/O CM
2 series · 32 of 48 positions shown · non-contrast
Comparison: Prior head CT from earlier the same day as well as
earlier studies.

CLINICAL DATA: Initial evaluation for neuro deficit, stroke
suspected. Abnormal CT.



[Series 10: tof_fl3d_tra_iso · axial · 0.6mm · 0.52mm/px · z∈[-176,-100]mm · 14 of 133 slices shown (1 of 2)]
[im 1/133]
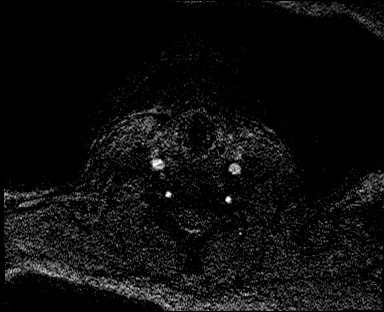
[im 11/133]
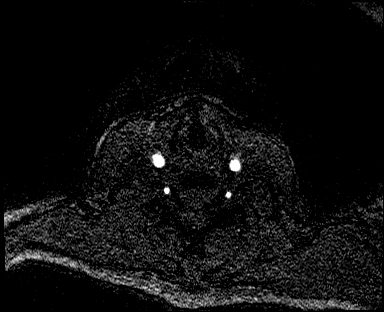
[im 21/133]
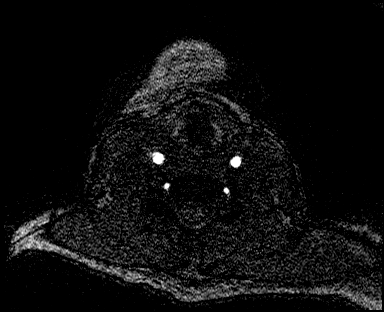
[im 31/133]
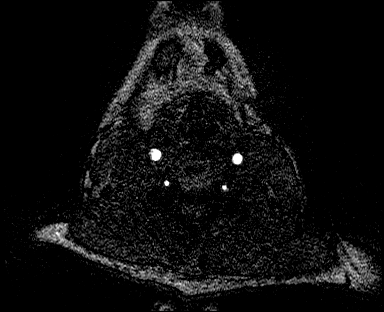
[im 41/133]
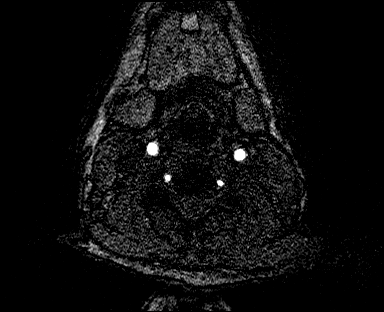
[im 51/133]
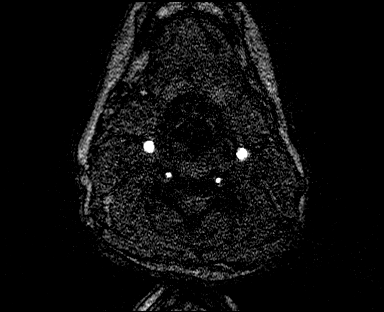
[im 61/133]
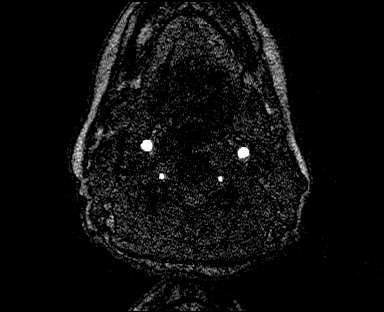
[im 72/133]
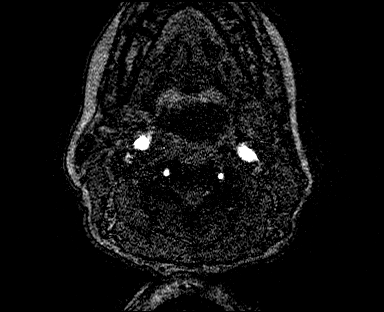
[im 82/133]
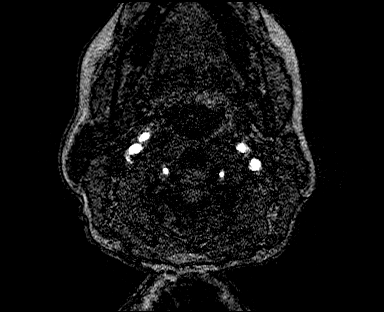
[im 92/133]
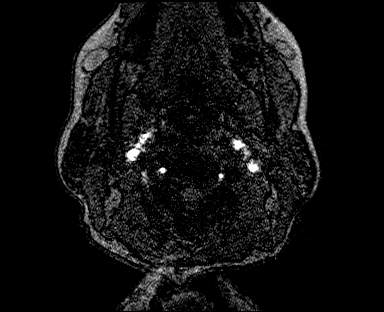
[im 102/133]
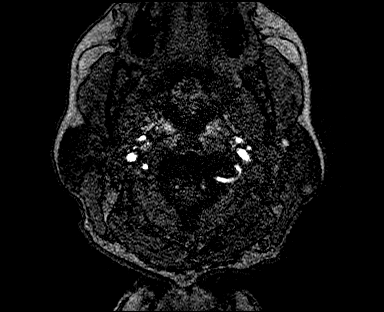
[im 112/133]
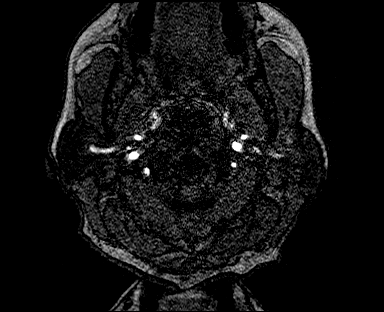
[im 122/133]
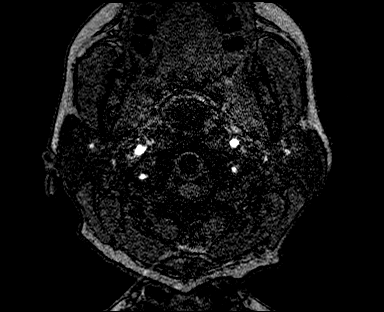
[im 133/133]
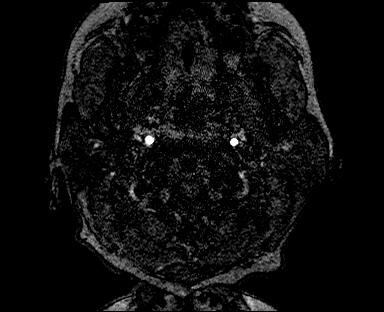

[Series 13: tof_fl3d_tra_iso · axial · 0.6mm · 0.52mm/px · z∈[-254,-81]mm · 18 of 319 slices shown (2 of 2)]
[im 1/319]
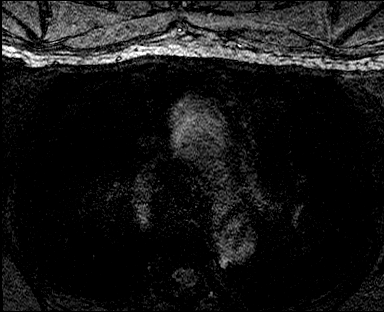
[im 10/319]
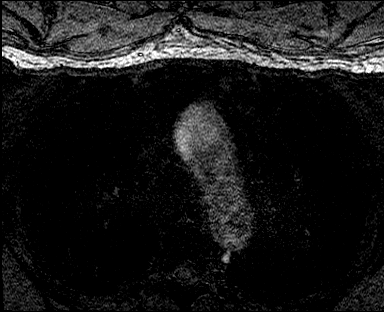
[im 20/319]
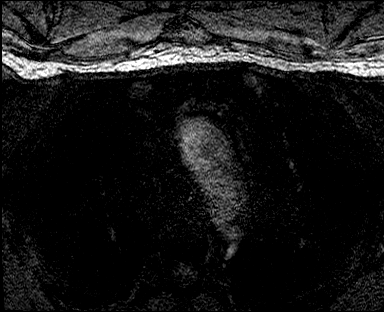
[im 29/319]
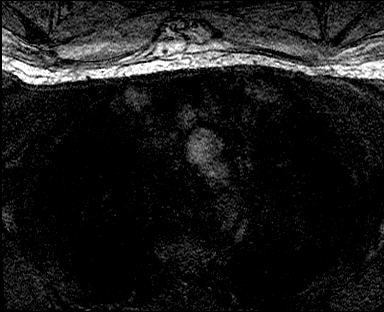
[im 39/319]
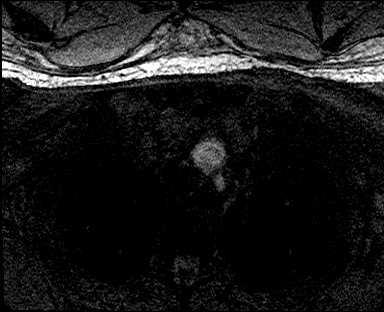
[im 49/319]
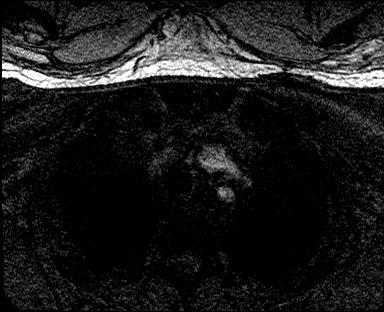
[im 58/319]
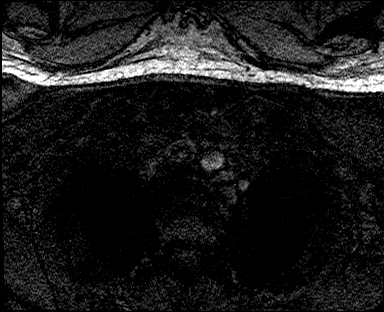
[im 68/319]
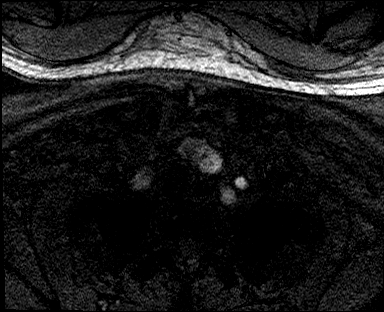
[im 78/319]
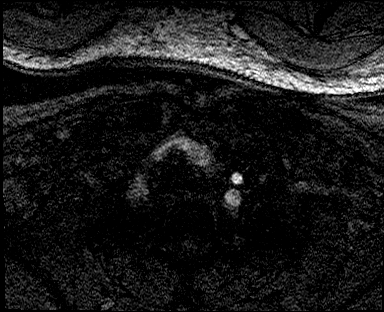
[im 87/319]
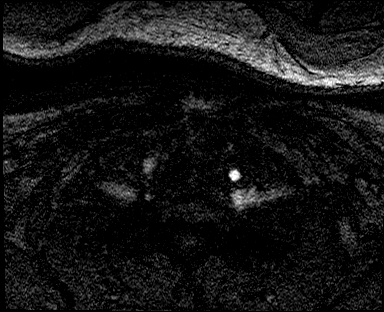
[im 97/319]
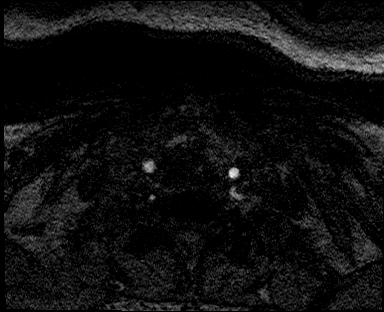
[im 135/319]
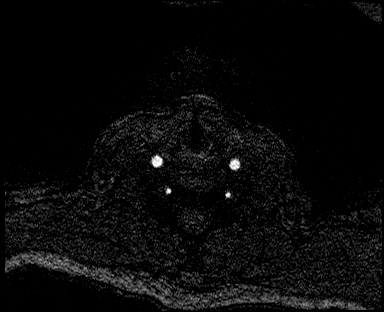
[im 164/319]
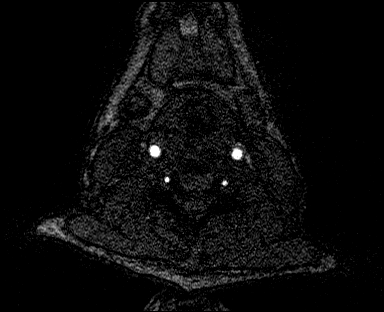
[im 184/319]
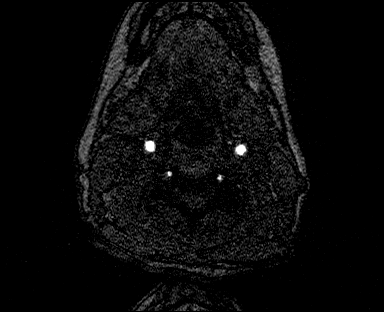
[im 222/319]
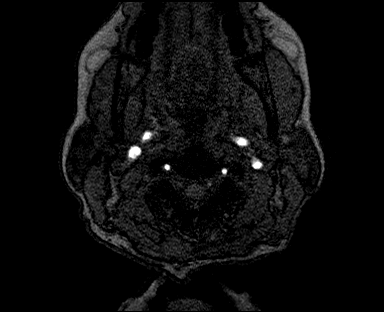
[im 261/319]
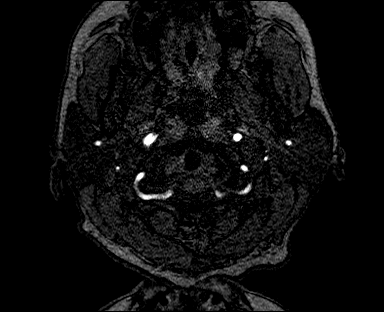
[im 270/319]
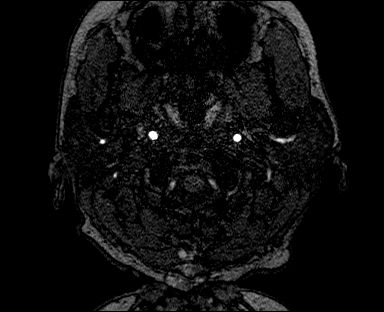
[im 299/319]
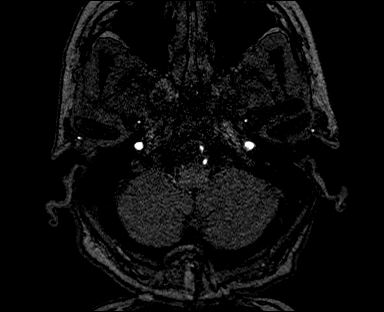

[32 of 48 positions shown; findings below may reference images not displayed]

FINDINGS: MRI HEAD FINDINGS

Brain: Cerebral volume within normal limits for age. Chronic
encephalomalacia and gliosis involving the left cerebral hemisphere
consistent with chronic left MCA distribution infarcts. Scattered
areas of chronic hemosiderin staining and/or laminar necrosis noted
throughout these areas. Small remote right cerebellar infarct noted.
Underlying mild chronic small vessel ischemic disease.

Abnormal restricted diffusion involving the right temporal occipital
junction consistent with an acute ischemic infarct, posterior right
MCA distribution (series 5, image 72). Finding corresponds with
abnormality on prior CT. No associated hemorrhage or mass effect.
Few additional scattered small volume foci of diffusion abnormality
involving the left frontal lobe (series 5, image 84) and left
periatrial region (series 5, image 73) favored to be artifactual
nature related underlying laminar necrosis and/or chronic blood
products.

Otherwise, gray-white matter differentiation maintained. No other
areas of acute infarction. No acute intracranial hemorrhage. 12 mm
benign appearing pineal cyst noted. No other mass lesion. Mild right
left deviation related to chronic left cerebral volume loss. No
hydrocephalus or extra-axial fluid collection. Pituitary gland and
suprasellar region normal. Midline structures intact.

Vascular: Major intracranial vascular flow voids maintained.
Vascular stent in place within the left M1 segment.

Skull and upper cervical spine: Craniocervical junction within
normal limits. Bone marrow signal intensity diffusely decreased on
T1 weighted sequence, nonspecific, but most commonly related to
anemia, smoking or obesity. No focal marrow replacing lesion. No
scalp soft tissue abnormality.

Sinuses/Orbits: Globes and orbital soft tissues within normal
limits. Paranasal sinuses are clear. No mastoid effusion.

Other: None.

MRA HEAD FINDINGS

ANTERIOR CIRCULATION:

Visualized distal cervical segments of the internal carotid arteries
are patent with antegrade flow. Petrous segments patent bilaterally.
Cavernous and supraclinoid left ICA widely patent. Short-segment
approximate 50% stenosis at the supraclinoid right ICA, grossly
similar to prior arteriogram (series [IR], image 10).

Right M1 segment remains patent. Normal right MCA bifurcation.
Probable severe proximal right M2 stenosis with associated flow gap
(series [IR], image 15). This is similar as compared to prior MRA.
Right MCA branches remain perfused distally, although demonstrates
small vessel atheromatous irregularity.

Vascular stent in place within the left M1 segment. Flow through the
stent itself not evaluated by MRA. Probable short-segment severe
left M2 stenosis, seen on prior MRA (series 5, image 113). Left MCA
branches otherwise patent distally.

A1 segments patent bilaterally. Normal anterior communicating artery
complex. Anterior cerebral arteries remain widely patent.

POSTERIOR CIRCULATION:

Both vertebral arteries remain widely patent to the vertebrobasilar
junction. Right PICA origin patent. Left PICA not seen. Basilar
patent to its distal aspect without stenosis. Superior cerebellar
arteries patent bilaterally. Both PCA supplied via hypoplastic P1
segments and robust bilateral posterior communicating arteries. PCAs
remain widely patent to their distal aspects.

No aneurysm.

MRA NECK FINDINGS

AORTIC ARCH: Visualized aortic arch normal caliber. Bovine branching
pattern noted at the aortic arch. No hemodynamically significant
stenosis about the origin of the great vessels.

RIGHT CAROTID SYSTEM: Right common and internal carotid arteries
patent without stenosis or evidence for dissection. Mild for age
atheromatous irregularity about the right bifurcation without
stenosis.

LEFT CAROTID SYSTEM: Left common and internal carotid arteries
patent without stenosis or evidence for dissection. Mild for age
atheromatous irregularity about the left bifurcation without
stenosis.

VERTEBRAL ARTERIES: Both vertebral arteries arise from subclavian
arteries. Vertebral arteries are largely codominant and widely
patent without stenosis or evidence for dissection.
IMPRESSION: MRI HEAD:

1. Acute ischemic nonhemorrhagic infarct involving the right
temporoccipital junction, posterior right MCA distribution.
2. Chronic left MCA territory infarcts, stable from prior.
3. Additional small remote right cerebellar infarct.
4. Underlying age-related cerebral atrophy with mild chronic small
vessel ischemic disease.

MRA HEAD:

1. Negative intracranial MRA for large vessel occlusion.
2. Approximate 50% stenosis at the supraclinoid right ICA, with
probable additional short-segment severe proximal right M2 stenosis.
3. Vascular stent in place within the left M1 segment. Flow through
the stent itself not evaluated by MRA, although patent flow seen
distal to the stent.
4. Short-segment severe left M2 stenosis, similar to previous.
5. Wide patency of the posterior circulation.

MRA NECK:

Wide patency of both carotid artery systems and vertebral arteries
within the neck. No hemodynamically significant stenosis or other
acute vascular abnormality.

## 2021-05-10 MED ORDER — SODIUM CHLORIDE 0.9 % IV BOLUS
500.0000 mL | Freq: Once | INTRAVENOUS | Status: AC
  Administered 2021-05-10: 500 mL via INTRAVENOUS

## 2021-05-10 MED ORDER — URSODIOL 250 MG PO TABS: 250.0000 mg | ORAL_TABLET | Freq: Two times a day (BID) | ORAL | Status: DC

## 2021-05-10 MED ORDER — DEXTROSE 50 % IV SOLN
1.0000 | Freq: Once | INTRAVENOUS | Status: AC
  Administered 2021-05-10: 50 mL via INTRAVENOUS
  Filled 2021-05-10: qty 50

## 2021-05-10 MED ORDER — URSODIOL 300 MG PO CAPS
300.0000 mg | ORAL_CAPSULE | Freq: Two times a day (BID) | ORAL | Status: DC
  Administered 2021-05-10 – 2021-05-12 (×4): 300 mg via ORAL
  Filled 2021-05-10 (×5): qty 1

## 2021-05-10 MED ORDER — SODIUM CHLORIDE 0.9% FLUSH
3.0000 mL | Freq: Once | INTRAVENOUS | Status: AC
  Administered 2021-05-10: 3 mL via INTRAVENOUS

## 2021-05-10 MED ORDER — ACETAMINOPHEN 325 MG PO TABS
650.0000 mg | ORAL_TABLET | Freq: Four times a day (QID) | ORAL | Status: DC | PRN
  Administered 2021-05-10 – 2021-05-11 (×2): 650 mg via ORAL
  Filled 2021-05-10 (×2): qty 2

## 2021-05-10 MED ORDER — MAGNESIUM SULFATE 2 GM/50ML IV SOLN
2.0000 g | Freq: Once | INTRAVENOUS | Status: AC
  Administered 2021-05-10: 2 g via INTRAVENOUS
  Filled 2021-05-10: qty 50

## 2021-05-10 MED ORDER — METOCLOPRAMIDE HCL 5 MG/ML IJ SOLN
5.0000 mg | Freq: Once | INTRAMUSCULAR | Status: AC
  Administered 2021-05-10: 5 mg via INTRAVENOUS
  Filled 2021-05-10: qty 2

## 2021-05-10 MED ORDER — LACTATED RINGERS IV SOLN: INTRAVENOUS | Status: AC

## 2021-05-10 MED ORDER — SODIUM CHLORIDE 0.9 % IV BOLUS
1000.0000 mL | Freq: Once | INTRAVENOUS | Status: AC
  Administered 2021-05-10: 1000 mL via INTRAVENOUS

## 2021-05-10 MED ORDER — PANTOPRAZOLE SODIUM 40 MG PO TBEC
40.0000 mg | DELAYED_RELEASE_TABLET | Freq: Every day | ORAL | Status: DC
  Administered 2021-05-11 – 2021-05-12 (×2): 40 mg via ORAL
  Filled 2021-05-10 (×2): qty 1

## 2021-05-10 MED ORDER — DIPHENHYDRAMINE HCL 50 MG/ML IJ SOLN
25.0000 mg | Freq: Once | INTRAMUSCULAR | Status: AC
  Administered 2021-05-10: 25 mg via INTRAVENOUS
  Filled 2021-05-10: qty 1

## 2021-05-10 MED ORDER — ASPIRIN 325 MG PO TABS: 325.0000 mg | ORAL_TABLET | Freq: Once | ORAL | Status: DC

## 2021-05-10 MED ORDER — ATORVASTATIN CALCIUM 80 MG PO TABS
80.0000 mg | ORAL_TABLET | Freq: Every day | ORAL | Status: DC
  Administered 2021-05-10 – 2021-05-11 (×2): 80 mg via ORAL
  Filled 2021-05-10 (×2): qty 1

## 2021-05-10 MED ORDER — APIXABAN 5 MG PO TABS
5.0000 mg | ORAL_TABLET | Freq: Two times a day (BID) | ORAL | Status: DC
  Administered 2021-05-10 – 2021-05-12 (×4): 5 mg via ORAL
  Filled 2021-05-10 (×4): qty 1

## 2021-05-10 MED ORDER — LEVETIRACETAM 500 MG PO TABS
500.0000 mg | ORAL_TABLET | Freq: Two times a day (BID) | ORAL | Status: DC
  Administered 2021-05-10 – 2021-05-12 (×4): 500 mg via ORAL
  Filled 2021-05-10 (×4): qty 1

## 2021-05-10 MED ORDER — STROKE: EARLY STAGES OF RECOVERY BOOK
Freq: Once | Status: AC
  Filled 2021-05-10: qty 1

## 2021-05-10 NOTE — Hospital Course (Addendum)
Headache for 3-4 days. Somewhat better now.   Brinita 2 days Device in chest  Eliquis afib Urodiol  Atorv Keppra seizure 3 months ago Potassium    Mother dm Father dementia  Kasandra Knudsen   Bariatric surgery  Lamonte Sakai [phone]

## 2021-05-10 NOTE — ED Triage Notes (Signed)
BIB POV after PCP recommended pt come to ER r/t to increase slurred speech, and increased HA. PT LKW 2200 05/10/2021. Pt woke up with symptoms this AM at 0730.   Pt on eliquis on brilanta ( but ran out of brilanta on Monday 05/08/21.  Pt hx: stroke

## 2021-05-10 NOTE — ED Provider Notes (Signed)
[facility] EMERGENCY DEPARTMENT Provider Note   CSN: [id number] Arrival date & time: 05/10/21  1056     History Chief Complaint  Patient presents with   Aphasia    Courtney Burke is a 65 y.o. female.  This is a 65 y.o. female with significant medical history as below, including left MCA stroke s/p stenting, seizure on keppra, expressive aphasia,  who presents to the ED with complaint of headache.  Patient accompanied by the daughter who is primary caretaker.  Patient with ongoing aphasia which is mildly improved over the past month per daughter secondary to prior CVA, she follows speech therapy.  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  Report of headache over the past 2 days described as "pain" localized to the right eye.  Pain has been constant, unrelieved with oral Tylenol, not associate vision changes, pain is not exacerbated by movement.  No falls or head injuries, no seizure activity reported.  Compliant with home medications although family reports she did run out of Brilinta approximately 1 week ago.  Still taking DOAC.  Daughter feels patient is acting at her approximate baseline at time of assessment   Level 5 caveat expressive aphasia   The history is provided by the patient and a relative. No language interpreter was used.      Past Medical History:  Diagnosis Date   Acute ischemic left MCA stroke ([place]) 06/30/2020   Acute ischemic stroke (HCC)    Acute stroke due to occlusion of left middle cerebral artery ([place]) 06/30/2020   Age-related nuclear cataract of both eyes 09/28/2015   Arthralgia of multiple joints 09/28/2015   Asthma    Borderline diabetes    Cerebrovascular accident (CVA) ([place])    Chronic bilateral thoracic back pain 06/05/2018   Chronic renal insufficiency, stage 1 08/11/2014   Diabetes mellitus type 2 in obese ([place])    Dyslipidemia    Dysphagia, post-stroke    Erosive gastritis 07/20/2019   Formatting of this  note might be different from the original. On EGD 07/2019   Esophagitis 07/20/2019   Formatting of this note might be different from the original. Added automatically from request for surgery [id number]  Formatting of this note might be different from the original. On EGD 07/2019   Essential hypertension 09/28/2015   Essential thrombocytosis ([place]) 09/28/2015   Gallstones 08/18/2019   Formatting of this note might be different from the original. 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   Hyperlipidemia, unspecified 03/16/2013   Hypertension    Keratoconjunctivitis sicca of both eyes not specified as Sjogren's 09/28/2015   Left middle cerebral artery stroke ([place]) 07/09/2020   Morbid obesity ([place]) 05/21/2013   Stage 3b chronic kidney disease ([place])    Status post gastric bypass for obesity 09/28/2015    Patient Active Problem List   Diagnosis Date Noted   Left middle cerebral artery stroke ([place]) 07/09/2020   Acute ischemic stroke [facility])    Cerebrovascular accident (CVA) ([place])    Dyslipidemia    Stage 3b chronic kidney disease ([place])    Dysphagia, post-stroke    Diabetes mellitus type 2 in obese ([place])    Acute ischemic left MCA stroke ([place]) 06/30/2020   Acute stroke due to occlusion of left middle cerebral artery ([place]) 06/30/2020   Gallstones 08/18/2019   Esophagitis 07/20/2019   Erosive gastritis 07/20/2019   Chronic bilateral thoracic  back pain 06/05/2018   Arthralgia of multiple joints 09/28/2015   Age-related nuclear cataract of both eyes 09/28/2015   Asthma 09/28/2015   Essential hypertension 09/28/2015   Essential thrombocytosis ([place]) 09/28/2015   Gastroesophageal reflux disease without esophagitis 09/28/2015   Keratoconjunctivitis sicca of both eyes not specified as Sjogren's 09/28/2015   Status post gastric bypass for obesity 09/28/2015   Chronic renal insufficiency, stage  1 08/11/2014   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   Morbid obesity ([place]) 05/21/2013   Hyperlipidemia, unspecified 03/16/2013    Past Surgical History:  Procedure Laterality Date   ANKLE SURGERY     CARPAL TUNNEL RELEASE     carpel tunnel     IR ANGIO INTRA EXTRACRAN SEL COM CAROTID INNOMINATE BILAT MOD SED  01/17/2021   IR ANGIO VERTEBRAL SEL SUBCLAVIAN INNOMINATE UNI R MOD SED  01/17/2021   IR CT HEAD LTD  06/30/2020   IR INTRA CRAN STENT  06/30/2020   IR PERCUTANEOUS ART THROMBECTOMY/INFUSION INTRACRANIAL INC DIAG ANGIO  06/30/2020   IR RADIOLOGIST EVAL & MGMT  08/26/2020   IR US GUIDE VASC ACCESS RIGHT  01/17/2021   RADIOLOGY WITH ANESTHESIA N/A 06/30/2020   Procedure: RADIOLOGY WITH ANESTHESIA;  Surgeon: Radiologist, Medication, MD;  Location: [facility];  Service: Radiology;  Laterality: N/A;     OB History   No obstetric history on file.     Family History  Problem Relation Age of Onset   Stroke Maternal Grandfather     Social History   Tobacco Use   Smoking status: Never   Smokeless tobacco: Never  Vaping Use   Vaping Use: Never used  Substance Use Topics   Alcohol use: No   Drug use: No    Home Medications Prior to Admission medications   Medication Sig Start Date End Date Taking? Authorizing Provider  atorvastatin (LIPITOR) 80 MG tablet Take 1 tablet (80 mg total) by mouth at bedtime. 07/25/20  Yes Angiulli, Lavon Paganini, PA-C  ELIQUIS 5 MG TABS tablet TAKE 1 TABLET BY MOUTH TWICE A DAY Patient taking differently: Take 5 mg by mouth 2 (two) times daily. 02/08/21  Yes Camnitz, Will Hassell Done, MD  levETIRAcetam (KEPPRA) 500 MG tablet Take 1 tablet (500 mg total) by mouth 2 (two) times daily. 02/13/21  Yes Frann Rider, NP  Multiple Vitamin (MULTIVITAMIN WITH MINERALS) TABS tablet Take 1 tablet by mouth daily.   Yes [provider]  omeprazole (PRILOSEC) 40 MG capsule Take 40 mg by mouth daily. 07/28/20  Yes [provider]  potassium chloride SA (KLOR-CON)  20 MEQ tablet Take 1 tablet (20 mEq total) by mouth daily. Patient taking differently: Take 40 mEq by mouth 2 (two) times daily. 02/08/21  Yes Camnitz, Will Hassell Done, MD  ursodiol (ACTIGALL) 250 MG tablet Take 250 mg by mouth 2 (two) times daily. 07/28/20  Yes [provider]  diphenhydrAMINE (BENADRYL) 50 MG tablet Take 1 tablet (50 mg total) by mouth at bedtime as needed for itching. Take Benadryl 50 mg at 7:30 am on 8/9 (with Prednisone 50 mg.) Patient not taking: Reported on 05/10/2021 01/13/21   Han, Aimee H, PA-C  predniSONE (DELTASONE) 50 MG tablet Pre medication for contrast allergy. Take 50 mg Prednisone at 7:30 pm on 8/8, at 1:30 am on 8/9; and 7:30 am on 8/9 (with Benadryl 50 mg at 7:30 am.) Patient not taking: Reported on 05/10/2021 01/13/21   Han, Aimee H, PA-C  ticagrelor (BRILINTA) 90 MG TABS tablet Take 1 tablet (90 mg  total) by mouth 2 (two) times daily. 09/30/20   Camnitz, Ocie Doyne, MD  Vitamin D3 (VITAMIN D) 25 MCG tablet TAKE 1 TABLET (1,000 UNITS TOTAL) BY MOUTH DAILY BEFORE BREAKFAST. Patient not taking: Reported on 05/10/2021 07/25/20 07/25/21  Angiulli, Lavon Paganini, PA-C    Allergies    Iodinated diagnostic agents, Doxycycline, Sulfa antibiotics, Codeine, Hydrocodone bit-homatrop mbr, and Ace inhibitors  Review of Systems   Review of Systems  Constitutional:  Negative for activity change and fever.  HENT:  Negative for facial swelling and trouble swallowing.   Eyes:  Negative for discharge and redness.  Respiratory:  Negative for cough and shortness of breath.   Cardiovascular:  Negative for chest pain and palpitations.  Gastrointestinal:  Negative for abdominal pain and nausea.  Genitourinary:  Negative for dysuria and flank pain.  Musculoskeletal:  Negative for back pain and gait problem.  Skin:  Negative for pallor and rash.  Neurological:  Positive for speech difficulty, weakness and headaches. Negative for syncope.   Physical Exam Updated Vital Signs BP 108/69  (BP Location: Right Arm)   Pulse (!) 56   Temp 97.7 F (36.5 C) (Oral)   Resp 14   LMP 09/28/2012   SpO2 100%   Physical Exam Vitals and nursing note reviewed.  Constitutional:      General: She is not in acute distress.    Appearance: Normal appearance.  HENT:     Head: Normocephalic and atraumatic. No raccoon eyes, Battle's sign, right periorbital erythema or left periorbital erythema.     Right Ear: External ear normal.     Left Ear: External ear normal.     Nose: Nose normal.     Mouth/Throat:     Mouth: Mucous membranes are moist.  Eyes:     General: No scleral icterus.       Right eye: No discharge.        Left eye: No discharge.     Extraocular Movements: Extraocular movements intact.     Pupils: Pupils are equal, round, and reactive to light.  Cardiovascular:     Rate and Rhythm: Normal rate and regular rhythm.     Pulses: Normal pulses.     Heart sounds: Normal heart sounds.  Pulmonary:     Effort: Pulmonary effort is normal. No respiratory distress.     Breath sounds: Normal breath sounds.  Abdominal:     General: Abdomen is flat.     Tenderness: There is no abdominal tenderness.  Musculoskeletal:        General: Normal range of motion.     Cervical back: Full passive range of motion without pain and normal range of motion.     Right lower leg: No edema.     Left lower leg: No edema.  Skin:    General: Skin is warm and dry.     Capillary Refill: Capillary refill takes less than 2 seconds.  Neurological:     Mental Status: She is alert. Mental status is at baseline.     GCS: GCS eye subscore is 4. GCS verbal subscore is 5. GCS motor subscore is 6.     Motor: No tremor or seizure activity.     Comments: Expressive aphasia, right sided weakness (chronic)  Psychiatric:        Mood and Affect: Mood normal.        Behavior: Behavior normal.    ED Results / Procedures / Treatments   Labs (all labs ordered are listed, but  only abnormal results are  displayed) Labs Reviewed  PROTIME-INR - Abnormal; Notable for the following components:      Result Value   Prothrombin Time 15.4 (*)    All other components within normal limits  CBC - Abnormal; Notable for the following components:   Hemoglobin 11.1 (*)    HCT 33.4 (*)    RDW 15.9 (*)    All other components within normal limits  COMPREHENSIVE METABOLIC PANEL - Abnormal; Notable for the following components:   Chloride 114 (*)    CO2 17 (*)    Creatinine, Ser 1.82 (*)    Alkaline Phosphatase 135 (*)    GFR, Estimated 30 (*)    All other components within normal limits  CBG MONITORING, ED - Abnormal; Notable for the following components:   Glucose-Capillary 62 (*)    All other components within normal limits  I-STAT CHEM 8, ED - Abnormal; Notable for the following components:   Potassium 5.2 (*)    Chloride 114 (*)    BUN 26 (*)    Creatinine, Ser 2.00 (*)    TCO2 19 (*)    Hemoglobin 11.6 (*)    HCT 34.0 (*)    All other components within normal limits  APTT  DIFFERENTIAL  MAGNESIUM  LEVETIRACETAM LEVEL  CBG MONITORING, ED    EKG EKG Interpretation  Date/Time:  Wednesday May 10 2021 11:16:19 EST Ventricular Rate:  61 PR Interval:  164 QRS Duration: 84 QT Interval:  409 QTC Calculation: 412 R Axis:   17 Text Interpretation: Sinus rhythm Abnormal R-wave progression, early transition Similar to prior Confirmed by Wynona Dove ([id number]) on 05/10/2021 1:12:16 PM  Radiology CT HEAD WO CONTRAST  Result Date: 05/10/2021 CLINICAL DATA:  Neurological deficit, acute, stroke suspected. Aphasia and confusion. EXAM: CT HEAD WITHOUT CONTRAST TECHNIQUE: Contiguous axial images were obtained from the base of the skull through the vertex without intravenous contrast. COMPARISON:  02/07/2021 FINDINGS: Brain: Previous left middle cerebral artery stent with old infarction within the left middle cerebral artery territory as seen previously. There is newly seen loss of Rustyn Conery-white  differentiation at the right temporoparietal junction. There could be acute infarction in this location. No sign of hemorrhage or mass effect. No hydrocephalus or extra-axial fluid collection. Vascular: Left MCA stent as noted above. Skull: Negative Sinuses/Orbits: Clear/normal Other: None IMPRESSION: Old left MCA territory strokes. Newly seen loss of Hitoshi Werts-white differentiation at the right temporoparietal junction suggesting acute infarction in that region today. No hemorrhage or mass effect. Electronically Signed   By: Nelson Chimes M.D.   On: 05/10/2021 12:43    Procedures Procedures   Medications Ordered in ED Medications  sodium chloride flush (NS) 0.9 % injection 3 mL (3 mLs Intravenous Given 05/10/21 1133)  dextrose 50 % solution 50 mL (50 mLs Intravenous Given 05/10/21 1130)  sodium chloride 0.9 % bolus 1,000 mL (1,000 mLs Intravenous New Bag/Given 05/10/21 1302)  metoCLOPramide (REGLAN) injection 5 mg (5 mg Intravenous Given 05/10/21 1302)  diphenhydrAMINE (BENADRYL) injection 25 mg (25 mg Intravenous Given 05/10/21 1303)  magnesium sulfate IVPB 2 g 50 mL (0 g Intravenous Stopped 05/10/21 1405)  sodium chloride 0.9 % bolus 500 mL (500 mLs Intravenous New Bag/Given 05/10/21 1406)    ED Course  I have reviewed the triage vital signs and the nursing notes.  Pertinent labs & imaging results that were available during my care of the patient were reviewed by me and considered in my medical decision making (see chart for  details).  Clinical Course as of 05/10/21 1522  Wed May 10, 2021  1254 "There is newly seen loss of Tashayla Therien-white differentiation at the right temporoparietal junction."   [SG]    Clinical Course User Index [SG] Jeanell Sparrow, DO   MDM Rules/Calculators/A&P                           CC: HA  This patient complains of ha; this involves an extensive number of treatment options and is a complaint that carries with it a high risk of complications and morbidity. Vital signs  were reviewed. Serious etiologies considered.  Record review:  Previous records obtained and reviewed   Additional history obtained from daughter  Work up as above, notable for:  Labs & imaging results that were available during my care of the patient were reviewed by me and considered in my medical decision making.   Cr mildly worsened from prior, give IVF.   I ordered imaging studies which included CTH and I independently visualized and interpreted imaging which showed possible acute infarct.  D/w neurology Dr Leonie Man who recommends MRA head/neck to further evaluate. Pt has iodinated contrast allergy reported and confirmed by daughter at bedside.   Given likely acute infarct recommend admission, given lack of new symptoms or worsening symptoms neurology has low suspicion for intervention but will evaluate and see what the MRA shows.  Otherwise plan for admission, d/w pt and daughter who are agreeable. D/w hospitalist who accepts pt for admission.     This chart was dictated using voice recognition software.  Despite best efforts to proofread,  errors can occur which can change the documentation meaning.  Final Clinical Impression(s) / ED Diagnoses Final diagnoses:  AKI (acute kidney injury) ([place])  Acute nonintractable headache, unspecified headache type  Aphasia  Cerebrovascular accident (CVA), unspecified mechanism [facility])    Rx / DC Orders ED Discharge Orders     None        Jeanell Sparrow, DO 05/10/21 1522

## 2021-05-10 NOTE — ED Triage Notes (Addendum)
Charge RN went to lobby to assess pt with stroke symptoms that arrived POV.  Daughter reports pt has had a headache x 2 days.  This morning she was slightly altered when she woke up and had increased slurred speech.  LKW 10pm last night.  Previous stroke with slurred speech.  Daughter took pt to speech therapy this morning and she was sent to ED via POV.  Difficulty getting pt to follow commands.  Pt brought straight to treatment room from lobby.  CBG 62.

## 2021-05-10 NOTE — Progress Notes (Signed)
Paged by RN regarding patient's headache.  Reports that the patient is describing a splitting headache, worse that she is ever had.  Evaluated at the bedside and patient was comfortably resting.  She was given Tylenol 650 mg and this seemed to improve her headache somewhat.  Patient continues to request for food throughout the evaluation.  Patient's brother was at the bedside and states that her confusion is worse than her baseline but has not noticed any changes since she has been in the hospital outside of this worsening headache.  States that her mentation worsened after her last stroke and she has been working with speech therapy.  Vitals:   05/10/21 1922 05/10/21 2016  BP: 129/74 127/61  Pulse: 62 64  Resp: 12 15  Temp:  98.9 F (37.2 C)  SpO2: 100% 100%   Physical exam- General: Comfortably resting in bed, no acute distress Neuro: Alert and orientedx3, no new neurological deficits appreciated.  Able to follow simple commands.  Strength 4 out of 5 of both upper and lower extremities.  Continues to have expressive aphasia, no slurred speech   Assessment/plan: Headache is likely in the setting of acute temporoparietal CVA which is small in nature.  There was concern low suspicion for hemorrhagic conversion due to size of stroke and no new neurological deficits on exam.  Discussed with Dr. Erlinda Hong who agrees.  Will continue to monitor.  -Tylenol as needed for headache -Continue gentle hydration   Brentin Shin, DO PGY-3 IMTS

## 2021-05-10 NOTE — H&P (Addendum)
Date: 05/10/2021               Patient Name:  Courtney Burke MRN: [id number]  DOB: Jul 05, 1955 Age / Sex: 65 y.o., female   PCP: [name], PA-C         Medical Service: Internal Medicine Teaching Service         Attending Physician: Dr. Evette Doffing, Mallie Mussel, *    First Contact: [name], MD Pager: AD [phone]  Second Contact: Iona Beard, MD Pager: Governor Rooks [phone]       After Hours (After 5p/  First Contact Pager: [phone]  weekends / holidays): Second Contact Pager: [phone]   SUBJECTIVE   Chief Complaint: slurred speech  History of Present Illness:  Courtney Burke is 65 y.o. F with a PMH of a left MCA stroke s/p stenting, seizures on keppra, expressive aphasia, HTN, gastric bypass surgery, CKD3B,T2DM who is coming into the ED with complaints of a headache and worsening slurred speech and confusion. History was mostly obtained from the daughter as patient is unable provide many details. Patient complains of pain on the right side of her face by her eye. She says that this has been going on all day. Denies any vision changes.   Speaking with family, they said that they noticed she was having more slurred speech this morning, but the daughter says this is not atypical for her when she first wakes up. She noticed this around 9 AM. She went to speech therapy and she said she didn't think the speech was as bad then, but then got worse afterwards and they came to the ED. She has been compliant with her medications but ran out of Brilinta two days ago. She had a seizure about three months ago which was when she was started on the keppra.    Meds:  Current Meds  Medication Sig   atorvastatin (LIPITOR) 80 MG tablet Take 1 tablet (80 mg total) by mouth at bedtime.   ELIQUIS 5 MG TABS tablet TAKE 1 TABLET BY MOUTH TWICE A DAY (Patient taking differently: Take 5 mg by mouth 2 (two) times daily.)   levETIRAcetam (KEPPRA) 500 MG tablet Take 1 tablet (500 mg total) by  mouth 2 (two) times daily.   Multiple Vitamin (MULTIVITAMIN WITH MINERALS) TABS tablet Take 1 tablet by mouth daily.   omeprazole (PRILOSEC) 40 MG capsule Take 40 mg by mouth daily.   potassium chloride SA (KLOR-CON) 20 MEQ tablet Take 1 tablet (20 mEq total) by mouth daily. (Patient taking differently: Take 40 mEq by mouth 2 (two) times daily.)   ursodiol (ACTIGALL) 250 MG tablet Take 250 mg by mouth 2 (two) times daily.    Past Medical History:  Diagnosis Date   Acute ischemic left MCA stroke ([place]) 06/30/2020   Acute ischemic stroke (HCC)    Acute stroke due to occlusion of left middle cerebral artery ([place]) 06/30/2020   Age-related nuclear cataract of both eyes 09/28/2015   Arthralgia of multiple joints 09/28/2015   Asthma    Borderline diabetes    Cerebrovascular accident (CVA) ([place])    Chronic bilateral thoracic back pain 06/05/2018   Chronic renal insufficiency, stage 1 08/11/2014   Diabetes mellitus type 2 in obese ([place])    Dyslipidemia    Dysphagia, post-stroke    Erosive gastritis 07/20/2019   Formatting of this note might be different from the original. On EGD 07/2019   Esophagitis 07/20/2019   Formatting of this note might be different from  the original. Added automatically from request for surgery [id number]  Formatting of this note might be different from the original. On EGD 07/2019   Essential hypertension 09/28/2015   Essential thrombocytosis ([place]) 09/28/2015   Gallstones 08/18/2019   Formatting of this note might be different from the original. 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   Hyperlipidemia, unspecified 03/16/2013   Hypertension    Keratoconjunctivitis sicca of both eyes not specified as Sjogren's 09/28/2015   Left middle cerebral artery stroke ([place]) 07/09/2020   Morbid obesity ([place]) 05/21/2013   Stage 3b chronic kidney disease ([place])    Status post gastric  bypass for obesity 09/28/2015    Past Surgical History:  Procedure Laterality Date   ANKLE SURGERY     CARPAL TUNNEL RELEASE     carpel tunnel     IR ANGIO INTRA EXTRACRAN SEL COM CAROTID INNOMINATE BILAT MOD SED  01/17/2021   IR ANGIO VERTEBRAL SEL SUBCLAVIAN INNOMINATE UNI R MOD SED  01/17/2021   IR CT HEAD LTD  06/30/2020   IR INTRA CRAN STENT  06/30/2020   IR PERCUTANEOUS ART THROMBECTOMY/INFUSION INTRACRANIAL INC DIAG ANGIO  06/30/2020   IR RADIOLOGIST EVAL & MGMT  08/26/2020   IR US GUIDE VASC ACCESS RIGHT  01/17/2021   RADIOLOGY WITH ANESTHESIA N/A 06/30/2020   Procedure: RADIOLOGY WITH ANESTHESIA;  Surgeon: Radiologist, Medication, MD;  Location: [place];  Service: Radiology;  Laterality: N/A;    Social:  Lives With: daughter Occupation: disabled Level of Function: walks with a cane Substances: none  Family History:  Family History  Problem Relation Age of Onset   Stroke Maternal Grandfather     Allergies: Allergies as of 05/10/2021 - Review Complete 05/10/2021  Allergen Reaction Noted   Iodinated diagnostic agents Itching 08/21/2019   Doxycycline Nausea And Vomiting 09/28/2015   Sulfa antibiotics Rash and Hives 03/16/2013   Codeine Hives and Swelling 09/28/2012   Hydrocodone bit-homatrop mbr Itching 05/15/2017   Ace inhibitors Cough, Itching, and Rash 09/28/2015    Review of Systems: A complete ROS was negative except as per HPI.   OBJECTIVE:   Physical Exam: Blood pressure 117/70, pulse (!) 59, temperature 97.7 F (36.5 C), temperature source Oral, resp. rate 15, last menstrual period 09/28/2012, SpO2 100 %.  Constitutional: elderly woman lying comfortably in bed in no acute distress Cardiovascular: regular rate and rhythm, no m/r/g Pulmonary/Chest: normal work of breathing on room air, lungs clear to auscultation bilaterally Abdominal: soft, non-tender, non-distended Neurological: alert and oriented to self and place, 5/5 strength ion the left upper and lower  extremities, 4/5 strength on the right. Cranial nerves grossly intact, expressive aphasia Skin: warm and dry Psych: normal affect  Labs: CBC    Component Value Date/Time   WBC 4.5 05/10/2021 1127   RBC 3.91 05/10/2021 1127   HGB 11.6 (L) 05/10/2021 1136   HCT 34.0 (L) 05/10/2021 1136   PLT 366 05/10/2021 1127   MCV 85.4 05/10/2021 1127   MCH 28.4 05/10/2021 1127   MCHC 33.2 05/10/2021 1127   RDW 15.9 (H) 05/10/2021 1127   LYMPHSABS 2.1 05/10/2021 1127   MONOABS 0.4 05/10/2021 1127   EOSABS 0.1 05/10/2021 1127   BASOSABS 0.0 05/10/2021 1127     CMP     Component Value Date/Time   NA 138 05/10/2021 1136   K 5.2 (H) 05/10/2021 1136   CL 114 (H) 05/10/2021 1136  CO2 17 (L) 05/10/2021 1127   GLUCOSE 80 05/10/2021 1136   BUN 26 (H) 05/10/2021 1136   CREATININE 2.00 (H) 05/10/2021 1136   CALCIUM 9.2 05/10/2021 1127   PROT 6.7 05/10/2021 1127   ALBUMIN 3.6 05/10/2021 1127   AST 18 05/10/2021 1127   ALT 14 05/10/2021 1127   ALKPHOS 135 (H) 05/10/2021 1127   BILITOT 0.9 05/10/2021 1127   GFRNONAA 30 (L) 05/10/2021 1127   GFRAA 39 (L) 07/08/2019 1202    Imaging: CT HEAD WO CONTRAST  Result Date: 05/10/2021 CLINICAL DATA:  Neurological deficit, acute, stroke suspected. Aphasia and confusion. EXAM: CT HEAD WITHOUT CONTRAST TECHNIQUE: Contiguous axial images were obtained from the base of the skull through the vertex without intravenous contrast. COMPARISON:  02/07/2021 FINDINGS: Brain: Previous left middle cerebral artery stent with old infarction within the left middle cerebral artery territory as seen previously. There is newly seen loss of gray-white differentiation at the right temporoparietal junction. There could be acute infarction in this location. No sign of hemorrhage or mass effect. No hydrocephalus or extra-axial fluid collection. Vascular: Left MCA stent as noted above. Skull: Negative Sinuses/Orbits: Clear/normal Other: None IMPRESSION: Old left MCA territory strokes.  Newly seen loss of gray-white differentiation at the right temporoparietal junction suggesting acute infarction in that region today. No hemorrhage or mass effect. Electronically Signed   By: Nelson Chimes M.D.   On: 05/10/2021 12:43    EKG: personally reviewed my interpretation is sinus rhythm   ASSESSMENT & PLAN:    Assessment & Plan by Problem:  Courtney Burke is a 65 y.o. with PMH of a left MCA stroke s/p stenting, seizures on keppra, expressive aphasia, HTN, gastric bypass surgery, CKD3B,T2DM who is coming into the ED with complaints of a headache and worsening slurred speech and confusion found to have a new infarction of the right temporoparietal junction.  #Acute temporoparietal CVA #History of left MCA strokes Patient has a history of a prior stroke back in January, from which she has some minor residual right sided weakness and more significant aphasia. At that time she had stents placed. Today she is coming in with worsening of her aphasia as well as confusion and an acute stroke was seen on CT. Due to patient's contrast allergy MRI has been delayed. No new motor deficits appreciated. Will discuss with family more to compare how different this is from her baseline.  - neurology consult, appreciate assistance - f/u echo - f/u MRI brain - continue stain, continue DAPT - SLP, PT, OT eval - f/u lipid panel and A1c  #Atrial Fibrillation Patient had recent diagnosis of atrial fibrillation, started on eliquis at that time and seen by cardiology. Currently in sinus rhythm. - CHADSVASC of 6 - continue eliquis  #HTN Permissive hypertension, holding home BP meds  #AKI on CKD Baseline creatinine appears to be about 1.3-1.4. Creatinine on admission elevated to 1.82. Has received about 1.5L fluids since admission. - avoid nephrotoxic medications - daily BMP  #Seizures -continue home keppra  #T2DM -f/u A1c  #Anemia -daily CBC --transfuse <7   Diet: NPO VTE:   eliquis IVF: None,None Code: Full  Dispo: Admit patient to Observation with expected length of stay less than 2 midnights.  Signed: [name], MD Internal Medicine Resident PGY-1 Pager: [phone]  05/10/2021, 4:28 PM

## 2021-05-10 NOTE — ED Notes (Signed)
Patient transported to MRI 

## 2021-05-11 ENCOUNTER — Observation Stay (HOSPITAL_COMMUNITY): Payer: Medicare Other

## 2021-05-11 DIAGNOSIS — I639 Cerebral infarction, unspecified: Secondary | ICD-10-CM

## 2021-05-11 DIAGNOSIS — R569 Unspecified convulsions: Secondary | ICD-10-CM

## 2021-05-11 DIAGNOSIS — Z882 Allergy status to sulfonamides status: Secondary | ICD-10-CM | POA: Diagnosis not present

## 2021-05-11 DIAGNOSIS — G9349 Other encephalopathy: Secondary | ICD-10-CM | POA: Diagnosis present

## 2021-05-11 DIAGNOSIS — Z79899 Other long term (current) drug therapy: Secondary | ICD-10-CM | POA: Diagnosis not present

## 2021-05-11 DIAGNOSIS — I129 Hypertensive chronic kidney disease with stage 1 through stage 4 chronic kidney disease, or unspecified chronic kidney disease: Secondary | ICD-10-CM | POA: Diagnosis present

## 2021-05-11 DIAGNOSIS — I4891 Unspecified atrial fibrillation: Secondary | ICD-10-CM | POA: Diagnosis present

## 2021-05-11 DIAGNOSIS — Z885 Allergy status to narcotic agent status: Secondary | ICD-10-CM | POA: Diagnosis not present

## 2021-05-11 DIAGNOSIS — R29707 NIHSS score 7: Secondary | ICD-10-CM | POA: Diagnosis present

## 2021-05-11 DIAGNOSIS — Z20822 Contact with and (suspected) exposure to covid-19: Secondary | ICD-10-CM | POA: Diagnosis present

## 2021-05-11 DIAGNOSIS — E669 Obesity, unspecified: Secondary | ICD-10-CM | POA: Diagnosis present

## 2021-05-11 DIAGNOSIS — I63441 Cerebral infarction due to embolism of right cerebellar artery: Secondary | ICD-10-CM | POA: Diagnosis present

## 2021-05-11 DIAGNOSIS — J45909 Unspecified asthma, uncomplicated: Secondary | ICD-10-CM | POA: Diagnosis present

## 2021-05-11 DIAGNOSIS — I63511 Cerebral infarction due to unspecified occlusion or stenosis of right middle cerebral artery: Secondary | ICD-10-CM

## 2021-05-11 DIAGNOSIS — Z91041 Radiographic dye allergy status: Secondary | ICD-10-CM | POA: Diagnosis not present

## 2021-05-11 DIAGNOSIS — D631 Anemia in chronic kidney disease: Secondary | ICD-10-CM | POA: Diagnosis present

## 2021-05-11 DIAGNOSIS — Z9884 Bariatric surgery status: Secondary | ICD-10-CM | POA: Diagnosis not present

## 2021-05-11 DIAGNOSIS — G40909 Epilepsy, unspecified, not intractable, without status epilepticus: Secondary | ICD-10-CM | POA: Diagnosis present

## 2021-05-11 DIAGNOSIS — G8191 Hemiplegia, unspecified affecting right dominant side: Secondary | ICD-10-CM | POA: Diagnosis present

## 2021-05-11 DIAGNOSIS — Z888 Allergy status to other drugs, medicaments and biological substances status: Secondary | ICD-10-CM | POA: Diagnosis not present

## 2021-05-11 DIAGNOSIS — E1122 Type 2 diabetes mellitus with diabetic chronic kidney disease: Secondary | ICD-10-CM | POA: Diagnosis present

## 2021-05-11 DIAGNOSIS — R4701 Aphasia: Secondary | ICD-10-CM | POA: Diagnosis present

## 2021-05-11 DIAGNOSIS — Z881 Allergy status to other antibiotic agents status: Secondary | ICD-10-CM | POA: Diagnosis not present

## 2021-05-11 DIAGNOSIS — I6389 Other cerebral infarction: Secondary | ICD-10-CM | POA: Diagnosis present

## 2021-05-11 DIAGNOSIS — N1832 Chronic kidney disease, stage 3b: Secondary | ICD-10-CM | POA: Diagnosis present

## 2021-05-11 DIAGNOSIS — I69320 Aphasia following cerebral infarction: Secondary | ICD-10-CM | POA: Diagnosis not present

## 2021-05-11 DIAGNOSIS — N179 Acute kidney failure, unspecified: Secondary | ICD-10-CM | POA: Diagnosis present

## 2021-05-11 DIAGNOSIS — Z7901 Long term (current) use of anticoagulants: Secondary | ICD-10-CM | POA: Diagnosis not present

## 2021-05-11 LAB — GLUCOSE, CAPILLARY
Glucose-Capillary: 137 mg/dL — ABNORMAL HIGH (ref 70–99)
Glucose-Capillary: 165 mg/dL — ABNORMAL HIGH (ref 70–99)
Glucose-Capillary: 75 mg/dL (ref 70–99)
Glucose-Capillary: 78 mg/dL (ref 70–99)
Glucose-Capillary: 87 mg/dL (ref 70–99)
Glucose-Capillary: 89 mg/dL (ref 70–99)
Glucose-Capillary: 91 mg/dL (ref 70–99)

## 2021-05-11 LAB — LIPID PANEL
Cholesterol: 82 mg/dL (ref 0–200)
HDL: 49 mg/dL (ref >40)
LDL Cholesterol: 21 mg/dL (ref 0–99)
Total CHOL/HDL Ratio: 1.7 RATIO
Triglycerides: 62 mg/dL (ref <150)
VLDL: 12 mg/dL (ref 0–40)

## 2021-05-11 LAB — BASIC METABOLIC PANEL
Anion gap: 6 (ref 5–15)
BUN: 16 mg/dL (ref 8–23)
CO2: 16 mmol/L — ABNORMAL LOW (ref 22–32)
Calcium: 9 mg/dL (ref 8.9–10.3)
Chloride: 114 mmol/L — ABNORMAL HIGH (ref 98–111)
Creatinine, Ser: 1.43 mg/dL — ABNORMAL HIGH (ref 0.44–1.00)
GFR, Estimated: 40 mL/min — ABNORMAL LOW (ref >60)
Glucose, Bld: 82 mg/dL (ref 70–99)
Potassium: 4.4 mmol/L (ref 3.5–5.1)
Sodium: 136 mmol/L (ref 135–145)

## 2021-05-11 LAB — HEMOGLOBIN A1C
Hgb A1c MFr Bld: 5.5 % (ref 4.8–5.6)
Mean Plasma Glucose: 111.15 mg/dL

## 2021-05-11 LAB — CBC
HCT: 29.4 % — ABNORMAL LOW (ref 36.0–46.0)
Hemoglobin: 10.2 g/dL — ABNORMAL LOW (ref 12.0–15.0)
MCH: 28.8 pg (ref 26.0–34.0)
MCHC: 34.7 g/dL (ref 30.0–36.0)
MCV: 83.1 fL (ref 80.0–100.0)
Platelets: 316 10*3/uL (ref 150–400)
RBC: 3.54 MIL/uL — ABNORMAL LOW (ref 3.87–5.11)
RDW: 15.3 % (ref 11.5–15.5)
WBC: 3.8 10*3/uL — ABNORMAL LOW (ref 4.0–10.5)
nRBC: 0 % (ref 0.0–0.2)

## 2021-05-11 LAB — ECHOCARDIOGRAM COMPLETE
Area-P 1/2: 2.95 cm2
Calc EF: 59.8 %
S' Lateral: 2.5 cm
Single Plane A2C EF: 57.6 %
Single Plane A4C EF: 59.4 %

## 2021-05-11 LAB — LEVETIRACETAM LEVEL: Levetiracetam Lvl: 34.4 ug/mL (ref 10.0–40.0)

## 2021-05-11 IMAGING — CT CT HEAD W/O CM
4 series · 17 of 47 positions shown, 19 images · non-contrast
Comparison: Previous CT and MRI from [DATE].

CLINICAL DATA: Follow-up examination for acute stroke.

EXAM:
CT HEAD WITHOUT CONTRAST
TECHNIQUE: Contiguous axial images were obtained from the base of the skull
through the vertex without intravenous contrast.

[Series 3: cor soft · coronal · 0.33mm/px · 3 of 68 slices shown]
[im 23/68  brain]
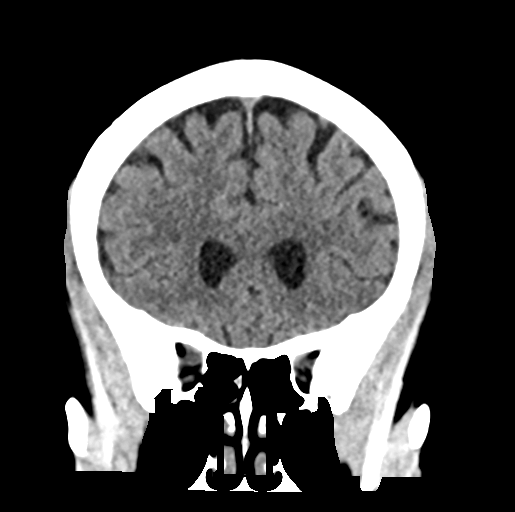
[im 30/68  brain]
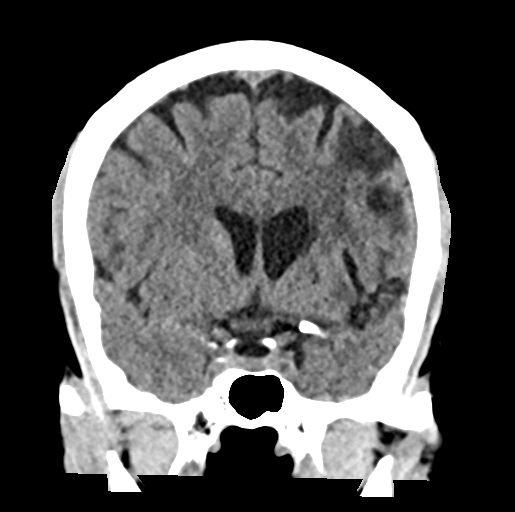
[im 38/68  brain]
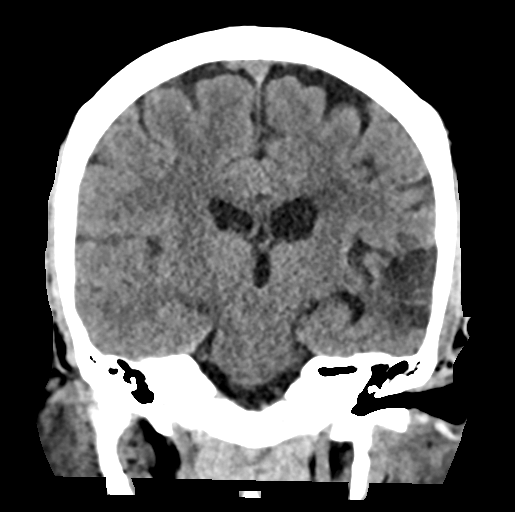

[Series 4: head wo · axial · 0.44mm/px · z∈[+1188,+1314]mm · 7 of 35 slices shown, 9 images]
[im 5/35  brain]
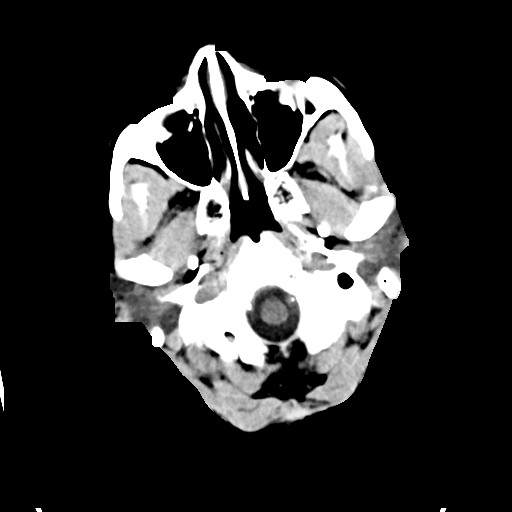
[im 5/35  bone]
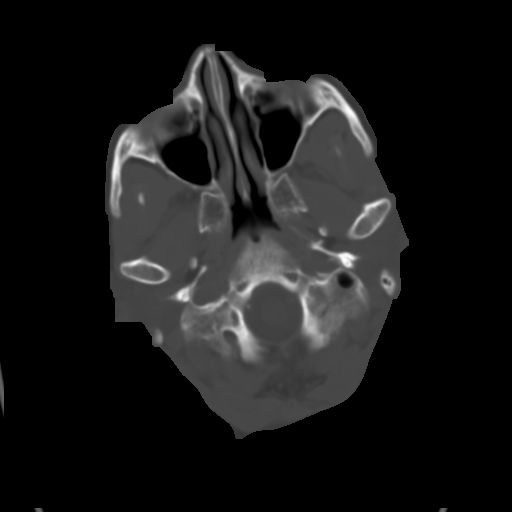
[im 9/35  brain]
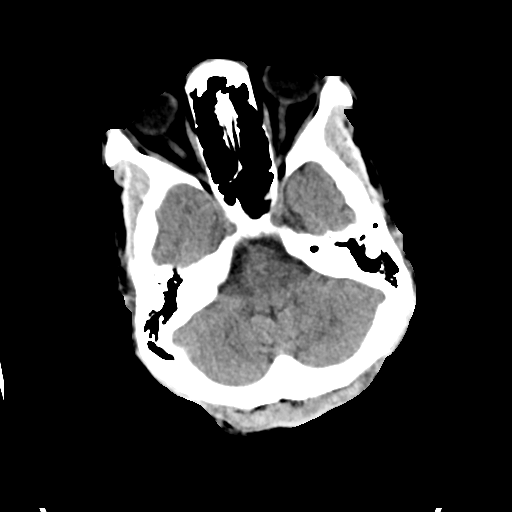
[im 13/35  brain]
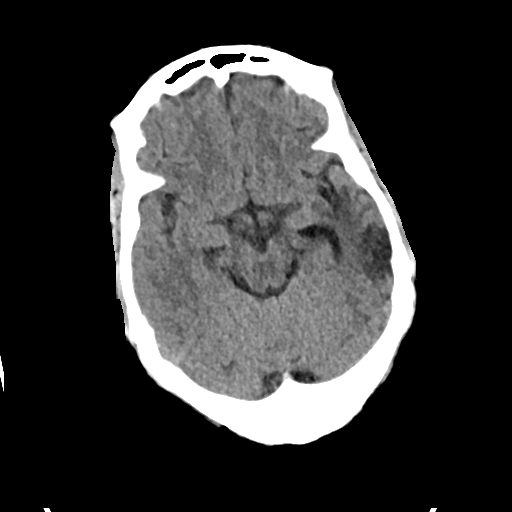
[im 18/35  brain]
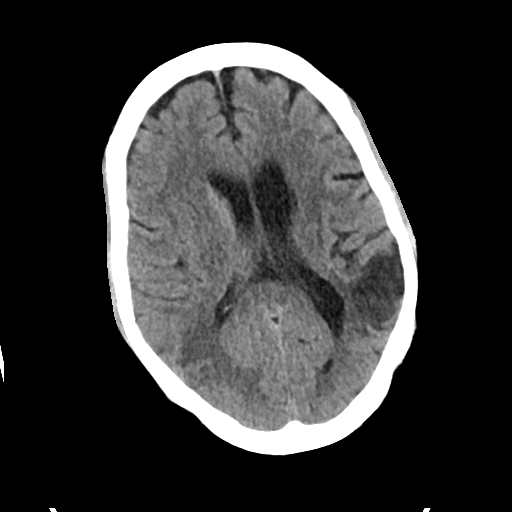
[im 22/35  brain]
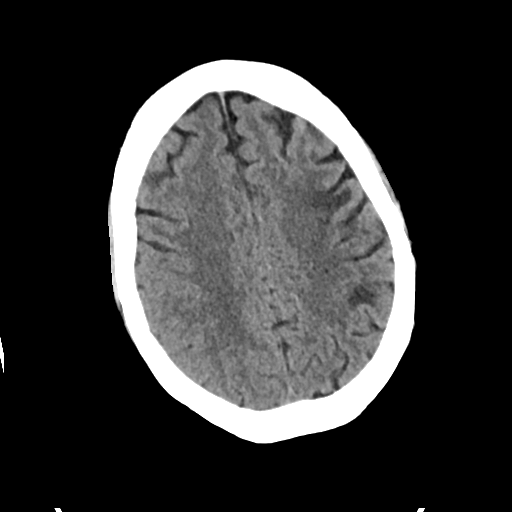
[im 22/35  bone]
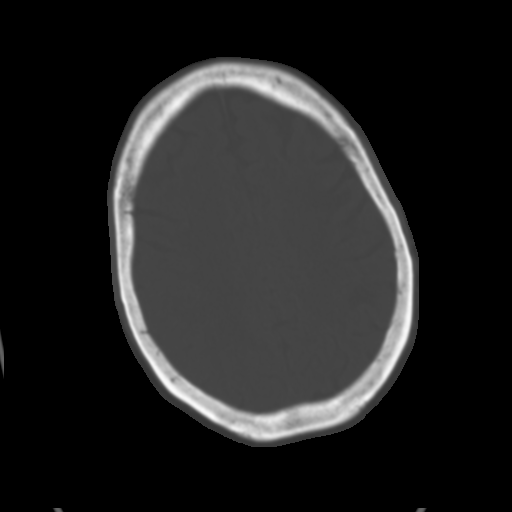
[im 26/35  brain]
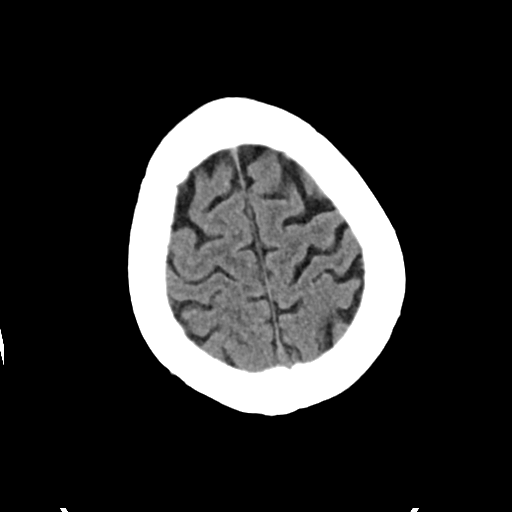
[im 30/35  brain]
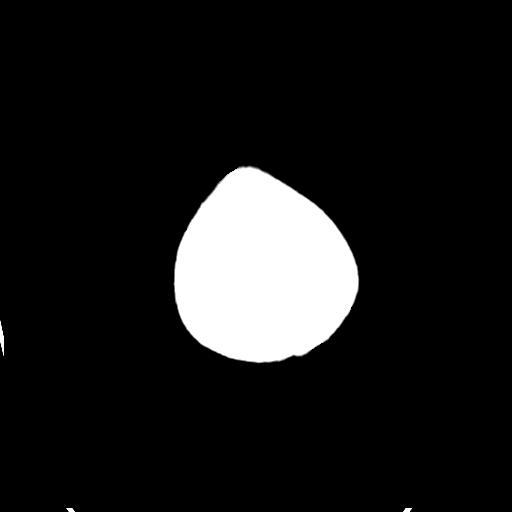

[Series 5: head bone · axial · 0.44mm/px · z∈[+1184,+1246]mm · 4 of 88 slices shown]
[im 9/88  bone]
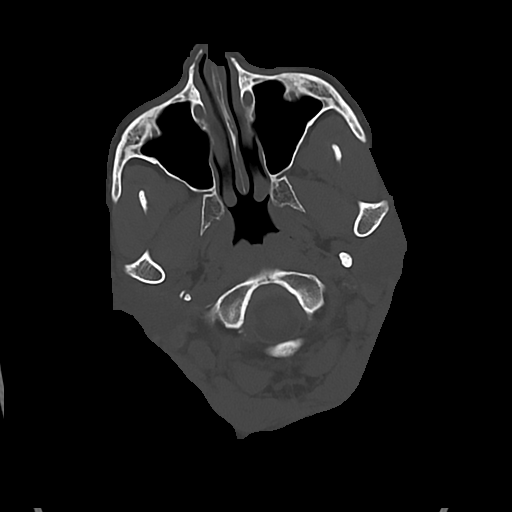
[im 18/88  bone]
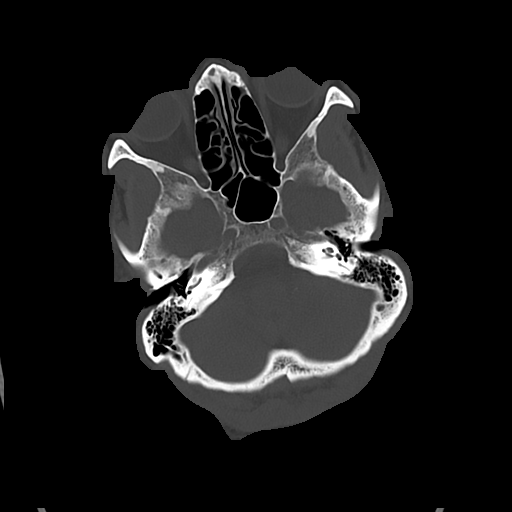
[im 27/88  bone]
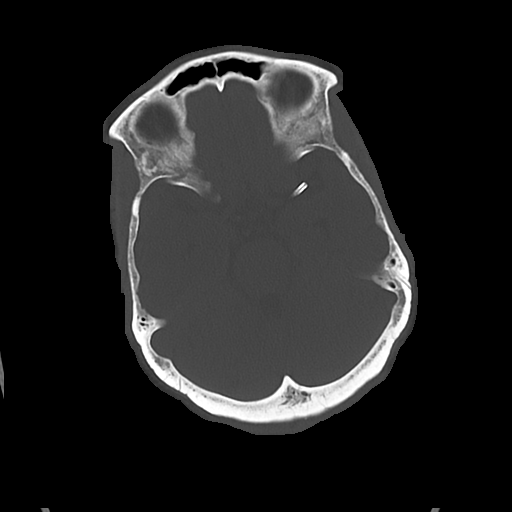
[im 40/88  bone]
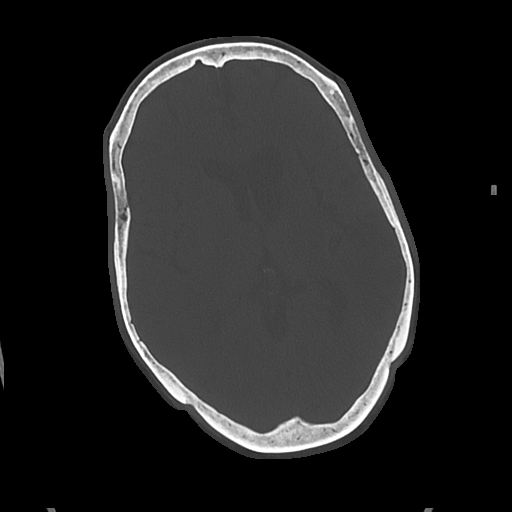

[Series 6: sag soft · sagittal · 0.40mm/px · 3 of 55 slices shown]
[im 19/55  brain]
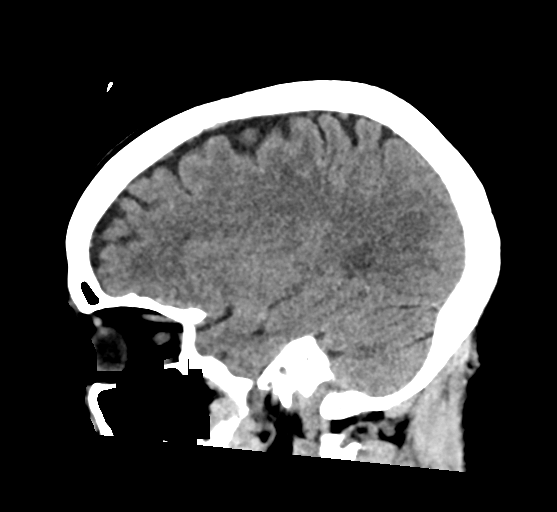
[im 28/55  brain]
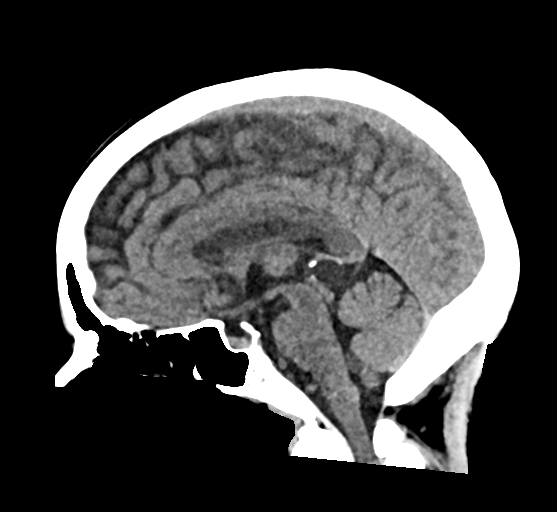
[im 37/55  brain]
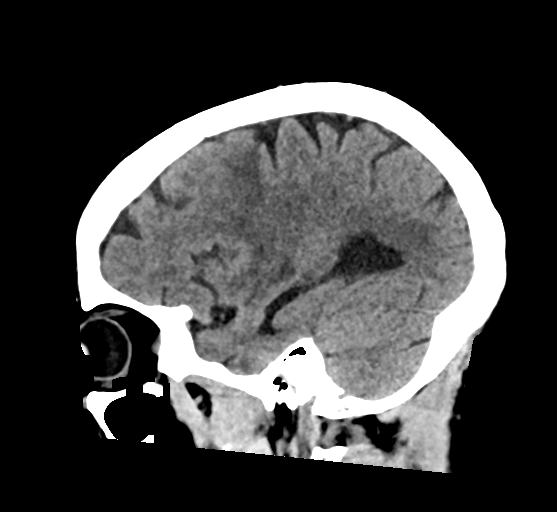

[17 of 47 positions shown; findings below may reference images not displayed]

FINDINGS: Brain: Previously identified acute ischemic infarct involving the
right temporal occipital junction again seen, relatively stable in
size and morphology as compared to previous MRI. No evidence for
hemorrhagic transformation or other complication. No significant
regional mass effect.

No other new or acute large vessel territory infarct. Chronic left
MCA distribution infarcts again noted. Small remote right cerebellar
infarct noted as well. No acute intracranial hemorrhage. No mass
lesion or new midline shift. No hydrocephalus or extra-axial fluid
collection.

Vascular: Vascular stent in place within the left M1 segment. No
hyperdense vessel.

Skull: Scalp soft tissues and calvarium demonstrate no new
abnormality.

Sinuses/Orbits: Globes and orbital soft tissues demonstrate no acute
finding. Paranasal sinuses mastoid air cells remain clear.

Other: None.
IMPRESSION: 1. No significant interval change in size and morphology of acute
ischemic infarct involving the right temporoccipital junction. No
evidence for hemorrhagic transformation or other complication.
2. No other new acute intracranial abnormality.

## 2021-05-11 MED ORDER — ASPIRIN EC 81 MG PO TBEC
81.0000 mg | DELAYED_RELEASE_TABLET | Freq: Every day | ORAL | Status: DC
  Administered 2021-05-11 – 2021-05-12 (×2): 81 mg via ORAL
  Filled 2021-05-11 (×2): qty 1

## 2021-05-11 NOTE — Progress Notes (Addendum)
HD#0 Subjective:  Overnight Events: NAEO   She says her headache is better today, she was really scared yesterday when he head started hurting. She knows that she is in the hospital.  Objective:  Vital signs in last 24 hours: Vitals:   05/11/21 0300 05/11/21 0400 05/11/21 0500 05/11/21 0600  BP:  (!) 99/56  123/68  Pulse: (!) 56 (!) 53 (!) 55 61  Resp: 11 12 10 17   Temp:      TempSrc:      SpO2: 100% 100% 100% 100%   Supplemental O2: Room Air SpO2: 100 %   Physical Exam:  Constitutional: elderly woman lying comfortably in bed in no acute distress Cardiovascular: regular rate and rhythm, no m/r/g Pulmonary/Chest: normal work of breathing on room air, lungs clear to auscultation bilaterally Abdominal: soft, non-tender, non-distended Neurological: alert and oriented to self and place, 5/5 strength ion the left upper and lower extremities, 4/5 strength on the right. Cranial nerves grossly intact, expressive aphasia Skin: warm and dry Psych: normal affect  There were no vitals filed for this visit.   Intake/Output Summary (Last 24 hours) at 05/11/2021 0637 Last data filed at 05/11/2021 0100 Gross per 24 hour  Intake 1545.42 ml  Output 700 ml  Net 845.42 ml   Net IO Since Admission: 845.42 mL [05/11/21 0637]  Pertinent Labs: CBC Latest Ref Rng & Units 05/11/2021 05/10/2021 05/10/2021  WBC 4.0 - 10.5 K/uL 3.8(L) - 4.5  Hemoglobin 12.0 - 15.0 g/dL 10.2(L) 11.6(L) 11.1(L)  Hematocrit 36.0 - 46.0 % 29.4(L) 34.0(L) 33.4(L)  Platelets 150 - 400 K/uL 316 - 366    CMP Latest Ref Rng & Units 05/11/2021 05/10/2021 05/10/2021  Glucose 70 - 99 mg/dL 82 80 84  BUN 8 - 23 mg/dL 16 26(H) 21  Creatinine 0.44 - 1.00 mg/dL 1.43(H) 2.00(H) 1.82(H)  Sodium 135 - 145 mmol/L 136 138 136  Potassium 3.5 - 5.1 mmol/L 4.4 5.2(H) 5.1  Chloride 98 - 111 mmol/L 114(H) 114(H) 114(H)  CO2 22 - 32 mmol/L 16(L) - 17(L)  Calcium 8.9 - 10.3 mg/dL 9.0 - 9.2  Total Protein 6.5 - 8.1 g/dL - - 6.7   Total Bilirubin 0.3 - 1.2 mg/dL - - 0.9  Alkaline Phos 38 - 126 U/L - - 135(H)  AST 15 - 41 U/L - - 18  ALT 0 - 44 U/L - - 14     Assessment/Plan:    Patient Summary: Courtney Burke is a 65 y.o. with PMH of a left MCA stroke s/p stenting, seizures on keppra, expressive aphasia, HTN, gastric bypass surgery, CKD3B,T2DM who is coming into the ED with complaints of a headache and worsening slurred speech and confusion found to have a new infarction of the right temporoparietal junction.   #Acute temporooccipital cerebral infarction #History of left MCA strokes Patient has a history of a prior stroke back in January, from which she has some minor residual right sided weakness and more significant aphasia. At that time she had stents placed. Patient had significant worsening of her headache overnight, CT head was repeated due to concern for possible hemorrhagic conversion however this was negative. She passed her swallow eval so she has a regular diet. Will discuss with family more to compare how different this is from her baseline.  - neurology consult, appreciate assistance - f/u echo - MRI brain showed ischemic nonhemorrhagic infarct of temporoccipital junction - continue statin, continue DAPT - PT, OT eval - lipid panel and A1c WNL   #  Atrial Fibrillation Patient had recent diagnosis of atrial fibrillation, started on eliquis at that time and seen by cardiology. Currently in sinus rhythm. - CHADSVASC of 6 - continue eliquis   #HTN Permissive hypertension, holding home BP meds   #AKI on CKD Baseline creatinine appears to be about 1.3-1.4. Creatinine on admission elevated to 1.82, this has improved to 1.43 on 12/8. Has received about 1.5L fluids since admission. - avoid nephrotoxic medications - daily BMP   #Seizures EEG shows encephalopathy likely related to the stroke, no seizure activity. -continue home keppra   #T2DM A1c 5.5   #Anemia -daily CBC --transfuse <7    Diet: NPO VTE:  eliquis IVF: None,None Code: Full  [name], MD Internal Medicine Resident PGY-1 Pager [phone] Please contact the on call pager after 5 pm and on weekends at [phone].

## 2021-05-11 NOTE — Progress Notes (Signed)
SLP Cancellation Note  Patient Details Name: Courtney Burke MRN: [id number] DOB: December 06, 1955   Cancelled treatment:       Reason Eval/Treat Not Completed: Patient at procedure or test/unavailable.  Second attempt to complete BSE/SLE. Pt starting EEG process. Will continue efforts.  Tressie Ragin B. Quentin Ore, [facility], [place] Speech Language Pathologist Office: [phone]  Shonna Chock 05/11/2021, 12:14 PM

## 2021-05-11 NOTE — Progress Notes (Signed)
EEG complete - results pending 

## 2021-05-11 NOTE — Evaluation (Signed)
Physical Therapy Evaluation Patient Details Name: Courtney Burke MRN: [id number] DOB: [birth year] Today's Date: 05/11/2021  History of Present Illness  65 y.o. F who presented to the ED 05/10/21 with complaints of a headache and worsening slurred speech and confusion.  CT head old left MCA infarct however new right temporal parietal infarct.   PMH of a left MCA stroke s/p stenting, seizures on keppra, expressive aphasia, HTN, gastric bypass surgery, CKD3B,T2DM  Clinical Impression   Pt admitted secondary to problem above with deficits below. Patient's prior functional status is unclear as she has aphasia and cannot clearly communicate. She indicates she used to use a rolling walker and no longer does (it appears).  Pt currently requires supervision for ambulation with mild gait deviations noted that may or may not be acute.  Anticipate patient will benefit from PT to address problems listed below.Will continue to follow acutely to maximize functional mobility independence and safety.          Recommendations for follow up therapy are one component of a multi-disciplinary discharge planning process, led by the attending physician.  Recommendations may be updated based on patient status, additional functional criteria and insurance authorization.  Follow Up Recommendations Outpatient PT    Assistance Recommended at Discharge Frequent or constant Supervision/Assistance (due to impaired language)  Functional Status Assessment Patient has had a recent decline in their functional status and demonstrates the ability to make significant improvements in function in a reasonable and predictable amount of time.  Equipment Recommendations  None recommended by PT    Recommendations for Other Services       Precautions / Restrictions Precautions Precautions: Other (comment) Precaution Comments: decr expressive and receptive language Restrictions Weight Bearing Restrictions: No      Mobility   Bed Mobility Overal bed mobility: Modified Independent             General bed mobility comments: no use of rail; incr time due to lines/tubes    Transfers Overall transfer level: Independent Equipment used: None               General transfer comment: from standard height bed and toilet without difficulty    Ambulation/Gait Ambulation/Gait assistance: Supervision Gait Distance (Feet): 100 Feet Assistive device: None Gait Pattern/deviations: Step-to pattern;Decreased step length - right;Decreased stance time - right;Decreased weight shift to right Gait velocity: decr Gait velocity interpretation: 1.31 - 2.62 ft/sec, indicative of limited community ambulator   General Gait Details: Gait almost appears antalgic with pt denying pain; left LE in slight external rotation; decr step length and weight shift over RLE  Stairs            Wheelchair Mobility    Modified Rankin (Stroke Patients Only) Modified Rankin (Stroke Patients Only) Pre-Morbid Rankin Score: Moderate disability Modified Rankin: Moderately severe disability     Balance Overall balance assessment: No apparent balance deficits (not formally assessed)                                           Pertinent Vitals/Pain Pain Assessment: No/denies pain    Home Living Family/patient expects to be discharged to:: Private residence Living Arrangements: Children (lives with daughter) Available Help at Discharge: Family Type of Home: House Home Access: Ramped entrance       Home Layout: Two level;Able to live on main level with bedroom/bathroom   Additional Comments: Pt  unable to report information regarding her d/c location due to global aphasia; information from previous medical record    Prior Function               Mobility Comments: indicates she used to use a RW and does not use anymore       Hand Dominance   Dominant Hand: Right    Extremity/Trunk Assessment    Upper Extremity Assessment Upper Extremity Assessment: Defer to OT evaluation    Lower Extremity Assessment Lower Extremity Assessment:  (difficult to assess due to impaired communication)    Cervical / Trunk Assessment Cervical / Trunk Assessment: Normal  Communication   Communication: Receptive difficulties;Expressive difficulties  Cognition Arousal/Alertness: Awake/alert Behavior During Therapy: WFL for tasks assessed/performed Overall Cognitive Status: Difficult to assess                                 General Comments: follows gestures and ?understands some verbal commands; answers most questions with stating her name        General Comments General comments (skin integrity, edema, etc.): Assisted to bathroom with pt independently performing pericare    Exercises     Assessment/Plan    PT Assessment Patient needs continued PT services  PT Problem List Decreased strength;Decreased mobility       PT Treatment Interventions Gait training;Functional mobility training;Balance training;Patient/family education    PT Goals (Current goals can be found in the Care Plan section)  Acute Rehab PT Goals Patient Stated Goal: unable to state due to aphasia PT Goal Formulation: Patient unable to participate in goal setting Time For Goal Achievement: 05/25/21 Potential to Achieve Goals: Good    Frequency Min 4X/week   Barriers to discharge Other (comment) unknown level of support family can provide    Co-evaluation               AM-PAC PT "6 Clicks" Mobility  Outcome Measure Help needed turning from your back to your side while in a flat bed without using bedrails?: None Help needed moving from lying on your back to sitting on the side of a flat bed without using bedrails?: None Help needed moving to and from a bed to a chair (including a wheelchair)?: A Little Help needed standing up from a chair using your arms (e.g., wheelchair or bedside chair)?: A  Little Help needed to walk in hospital room?: A Little Help needed climbing 3-5 steps with a railing? : A Little 6 Click Score: 20    End of Session Equipment Utilized During Treatment: Gait belt Activity Tolerance: Patient tolerated treatment well Patient left: in chair;with call bell/phone within reach Nurse Communication: Mobility status;Other (comment) (bed wet; assisted to bathroom) PT Visit Diagnosis: Other abnormalities of gait and mobility (R26.89)    Time: 7680-8811 PT Time Calculation (min) (ACUTE ONLY): 23 min   Charges:   PT Evaluation $PT Eval Low Complexity: [place], PT Acute Rehabilitation Services  Pager [phone] Office [phone]   Rexanne Mano 05/11/2021, 9:35 AM

## 2021-05-11 NOTE — Progress Notes (Signed)
  Echocardiogram 2D Echocardiogram has been performed.  Courtney Burke 05/11/2021, 2:11 PM

## 2021-05-11 NOTE — Plan of Care (Signed)
  Problem: Education: Goal: Knowledge of disease or condition will improve 05/11/2021 1110 by Dorris Carnes, RN Outcome: Progressing 05/11/2021 1107 by Dorris Carnes, RN Outcome: Progressing Goal: Knowledge of secondary prevention will improve (SELECT ALL) 05/11/2021 1110 by Dorris Carnes, RN Outcome: Progressing 05/11/2021 1107 by Dorris Carnes, RN Outcome: Progressing   Problem: Clinical Measurements: Goal: Ability to maintain clinical measurements within normal limits will improve Outcome: Progressing Goal: Will remain free from infection Outcome: Progressing Goal: Diagnostic test results will improve Outcome: Progressing Goal: Respiratory complications will improve Outcome: Progressing Goal: Cardiovascular complication will be avoided Outcome: Progressing

## 2021-05-11 NOTE — Progress Notes (Signed)
2D echo attempted, patient with physical therapy

## 2021-05-11 NOTE — Evaluation (Signed)
Occupational Therapy Evaluation Patient Details Name: Courtney Burke MRN: [id number] DOB: [birth year] Today's Date: 05/11/2021   History of Present Illness 65 y.o. F who presented to the ED 05/10/21 with complaints of a headache and worsening slurred speech and confusion.  CT head old left MCA infarct however new right temporal parietal infarct.   PMH of a left MCA stroke s/p stenting, seizures on keppra, expressive aphasia, HTN, gastric bypass surgery, CKD3B,T2DM   Clinical Impression   Pt seen for concerns listed above. Pt prior levels unknown at this time due to global aphasia. Most likely pt was independent with mobility and ADL's. This session, pt completed functional mobility with no DME. Overall pt has mild balance deficits, however she did not have any LoB. Additionally, due to impulsivity and safety, pt requiring min guard to min A for all ADL's. Recommending Out Patient OT, OT will follow acutely to address concerns listed below.      Recommendations for follow up therapy are one component of a multi-disciplinary discharge planning process, led by the attending physician.  Recommendations may be updated based on patient status, additional functional criteria and insurance authorization.   Follow Up Recommendations  Outpatient OT    Assistance Recommended at Discharge Set up Supervision/Assistance  Functional Status Assessment  Patient has had a recent decline in their functional status and demonstrates the ability to make significant improvements in function in a reasonable and predictable amount of time.  Equipment Recommendations  None recommended by OT    Recommendations for Other Services       Precautions / Restrictions Precautions Precautions: Other (comment) Precaution Comments: decr expressive and receptive language Restrictions Weight Bearing Restrictions: No      Mobility Bed Mobility Overal bed mobility: Modified Independent             General bed  mobility comments: no use of rail; incr time due to lines/tubes    Transfers Overall transfer level: Needs assistance Equipment used: None Transfers: Sit to/from Stand Sit to Stand: Min guard           General transfer comment: Min guard for safety due to impusivity and mild balance deficits.      Balance Overall balance assessment: Mild deficits observed, not formally tested                                         ADL either performed or assessed with clinical judgement   ADL Overall ADL's : Needs assistance/impaired Eating/Feeding: Set up;Sitting   Grooming: Set up;Sitting   Upper Body Bathing: Min guard;Sitting   Lower Body Bathing: Minimal assistance;Sitting/lateral leans;Sit to/from stand   Upper Body Dressing : Min guard;Sitting   Lower Body Dressing: Minimal assistance;Sitting/lateral leans;Sit to/from stand   Toilet Transfer: Minimal assistance;Ambulation   Toileting- Clothing Manipulation and Hygiene: Minimal assistance;Sitting/lateral lean;Sit to/from stand       Functional mobility during ADLs: Minimal assistance General ADL Comments: Pt very impulsive, at times off balance, requiring min guard to min a for all ADL's.     Vision Baseline Vision/History: 0 No visual deficits Ability to See in Adequate Light: 0 Adequate Patient Visual Report: No change from baseline Vision Assessment?: Vision impaired- to be further tested in functional context Additional Comments: Unable to fully assess vision due to global aphasia/?cognition     Perception     Praxis      Pertinent Vitals/Pain  Pain Assessment: No/denies pain     Hand Dominance Right   Extremity/Trunk Assessment Upper Extremity Assessment Upper Extremity Assessment: Overall WFL for tasks assessed   Lower Extremity Assessment Lower Extremity Assessment: Defer to PT evaluation   Cervical / Trunk Assessment Cervical / Trunk Assessment: Normal   Communication  Communication Communication: Receptive difficulties;Expressive difficulties   Cognition Arousal/Alertness: Awake/alert Behavior During Therapy: WFL for tasks assessed/performed Overall Cognitive Status: Difficult to assess                                 General Comments: follows gestures and ?understands some verbal commands; Pt unable to accurately answer questions     General Comments       Exercises     Shoulder Instructions      Home Living Family/patient expects to be discharged to:: Private residence Living Arrangements: Children (lives with daughter) Available Help at Discharge: Family Type of Home: House Home Access: Ramped entrance     [place]: Two level;Able to live on main level with bedroom/bathroom     Bathroom Shower/Tub: Teacher, early years/pre: Standard Bathroom Accessibility: Yes       Additional Comments: Pt unable to report information regarding her d/c location due to global aphasia; information from previous medical record      Prior Functioning/Environment               Mobility Comments: indicates she used to use a RW and does not use anymore          OT Problem List: Decreased activity tolerance;Impaired balance (sitting and/or standing);Decreased coordination;Decreased cognition;Decreased safety awareness;Decreased knowledge of use of DME or AE      OT Treatment/Interventions: Self-care/ADL training;Therapeutic exercise;Energy conservation;DME and/or AE instruction;Therapeutic activities;Cognitive remediation/compensation;Patient/family education;Balance training;Visual/perceptual remediation/compensation    OT Goals(Current goals can be found in the care plan section) Acute Rehab OT Goals Patient Stated Goal: none stated OT Goal Formulation: With patient Time For Goal Achievement: 05/25/21 Potential to Achieve Goals: Good ADL Goals Pt Will Perform Lower Body Bathing: with modified  independence;sitting/lateral leans;sit to/from stand Pt Will Perform Lower Body Dressing: with modified independence;sitting/lateral leans;sit to/from stand Pt Will Transfer to Toilet: with modified independence;ambulating Pt Will Perform Toileting - Clothing Manipulation and hygiene: with modified independence;sitting/lateral leans;sit to/from stand Additional ADL Goal #1: Pt will follow 100% of 1 and 2 step commands during session.  OT Frequency: Min 2X/week   Barriers to D/C:            Co-evaluation              AM-PAC OT "6 Clicks" Daily Activity     Outcome Measure Help from another person eating meals?: A Little Help from another person taking care of personal grooming?: A Little Help from another person toileting, which includes using toliet, bedpan, or urinal?: A Little Help from another person bathing (including washing, rinsing, drying)?: A Little Help from another person to put on and taking off regular upper body clothing?: A Little Help from another person to put on and taking off regular lower body clothing?: A Little 6 Click Score: 18   End of Session Equipment Utilized During Treatment: Gait belt Nurse Communication: Mobility status  Activity Tolerance: Patient tolerated treatment well Patient left: in chair;with call bell/phone within reach  OT Visit Diagnosis: Unsteadiness on feet (R26.81);Other abnormalities of gait and mobility (R26.89);Muscle weakness (generalized) (M62.81);Other symptoms and signs involving cognitive  function                Time: 7048-8891 OT Time Calculation (min): 14 min Charges:  OT General Charges $OT Visit: 1 Visit OT Evaluation $OT Eval Moderate Complexity: 1 Mod  Lakela Kuba H., OTR/L Acute Rehabilitation  Constantino Starace Elane Yolanda Bonine 05/11/2021, 1:38 PM

## 2021-05-11 NOTE — Procedures (Signed)
Patient Name: Courtney Burke  MRN: [id number]  Epilepsy Attending: Lora Havens  Referring Physician/Provider: Dr Lalla Brothers Date: 05/11/2021  Duration: 25.37 mins  Patient history: 65 year old female with history of seizures and temporoparietal CVA.  EEG to evaluate for seizure.  Level of alertness: Awake, asleep  AEDs during EEG study: Keppra  Technical aspects: This EEG study was done with scalp electrodes positioned according to the 10-20 International system of electrode placement. Electrical activity was acquired at a sampling rate of 500Hz  and reviewed with a high frequency filter of 70Hz  and a low frequency filter of 1Hz . EEG data were recorded continuously and digitally stored.   Description: The posterior dominant rhythm consists of 8-9Hz  activity of moderate voltage (25-35 uV) seen predominantly in posterior head regions, symmetric and reactive to eye opening and eye closing. Sleep was characterized by vertex waves, sleep spindles (12 to 14 Hz), maximal frontocentral region.  EEG showed intermittent generalized  3 to 6 Hz theta-delta slowing. Hyperventilation and photic stimulation were not performed.     ABNORMALITY - Intermittent slow, generalized  IMPRESSION: This study is suggestive of mild diffuse encephalopathy, nonspecific etiology. No seizures or epileptiform discharges were seen throughout the recording.  Makena Mcgrady Barbra Sarks

## 2021-05-11 NOTE — Consult Note (Signed)
Stroke Neurology Consultation Note  Consult Requested by: Dr. Evette Doffing  Reason for Consult: stroke  Consult Date: 05/11/21   The history was obtained from the chart and brother.  During history and examination, all items were able to obtain unless otherwise noted.  History of Present Illness:  Courtney Burke is a 65 y.o. African American female with PMH of hypertension, diabetes, CKD, obesity status post gastric bypass in 05/2020, stroke in 06/2020, seizure due to stroke admitted for headache, worsening speech and confusion.  In 06/2020, patient admitted for confusion and seizure-like activity, left gaze and right-sided weakness.  CT showed left MCA early ischemic changes.  CTA head neck showed left M1 occlusion, severe right ACA origin and moderate right M2 stenosis.  Status post left MCA thrombectomy and MCA stenting.  EF 60 to 65%, DVT negative, loop recorder placed.  EEG showed intermittent delta activity left frontal region, put on Keppra.  A1c 5.4, LDL 68, discharged with aspirin Brilinta and Lipitor 80.  Over time, she follow-up with [facility] and also speech therapy.  Per brother, patient recovered well, walking independently, able to communicate without difficulty.  Loop recorder found to have A. fib in 10/2020, she was put on Eliquis.  Seizure activity 02/2021, restarted Keppra.  Per brother and chart, patient last seen well last night before went to sleep.  On woke up this morning 7:30 AM, patient was found to have worsening slurred speech, confusion, and headache.  No significant weakness.  Headache at bifrontal area.  CT head old left MCA infarct however new right temporal parietal infarct.  MRI confirmed right MCA temporal occipital junction moderate infarct.  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  Tonight, pt again complained of HA, received tylenol and HA improving.  However, on exam, pt slightly agitated, fluent language with intermittent word salad, difficulty with comprehension. Per brother, this is new change tonight. Pt had eliquis 10:27pm tonight, will check CT to rule out hemorrhagic conversion.   LSN: 10 PM 05/09/2021 tPA Given: No: Outside window  Past Medical History:  Diagnosis Date   Acute ischemic left MCA stroke ([place]) 06/30/2020   Acute ischemic stroke (HCC)    Acute stroke due to occlusion of left middle cerebral artery ([place]) 06/30/2020   Age-related nuclear cataract of both eyes 09/28/2015   Arthralgia of multiple joints 09/28/2015   Asthma    Borderline diabetes    Cerebrovascular accident (CVA) ([place])    Chronic bilateral thoracic back pain 06/05/2018   Chronic renal insufficiency, stage 1 08/11/2014   Diabetes mellitus type 2 in obese ([place])    Dyslipidemia    Dysphagia, post-stroke    Erosive gastritis 07/20/2019   Formatting of this note might be different from the original. On EGD 07/2019   Esophagitis 07/20/2019   Formatting of this note might be different from the original. Added automatically from request for surgery [id number]  Formatting of this note might be different from the original. On EGD 07/2019   Essential hypertension 09/28/2015   Essential thrombocytosis ([place]) 09/28/2015   Gallstones 08/18/2019   Formatting of this note might be different from the original. 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   Hyperlipidemia, unspecified 03/16/2013   Hypertension    Keratoconjunctivitis sicca of both eyes not specified as Sjogren's 09/28/2015   Left middle cerebral  artery stroke ([place]) 07/09/2020   Morbid obesity ([place]) 05/21/2013   Stage 3b chronic kidney disease ([place])    Status post gastric bypass for obesity 09/28/2015    Past Surgical History:  Procedure Laterality Date   ANKLE SURGERY     CARPAL TUNNEL RELEASE      carpel tunnel     IR ANGIO INTRA EXTRACRAN SEL COM CAROTID INNOMINATE BILAT MOD SED  01/17/2021   IR ANGIO VERTEBRAL SEL SUBCLAVIAN INNOMINATE UNI R MOD SED  01/17/2021   IR CT HEAD LTD  06/30/2020   IR INTRA CRAN STENT  06/30/2020   IR PERCUTANEOUS ART THROMBECTOMY/INFUSION INTRACRANIAL INC DIAG ANGIO  06/30/2020   IR RADIOLOGIST EVAL & MGMT  08/26/2020   IR US GUIDE VASC ACCESS RIGHT  01/17/2021   RADIOLOGY WITH ANESTHESIA N/A 06/30/2020   Procedure: RADIOLOGY WITH ANESTHESIA;  Surgeon: Radiologist, Medication, MD;  Location: [place];  Service: Radiology;  Laterality: N/A;    Family History  Problem Relation Age of Onset   Stroke Maternal Grandfather     Social History:  reports that she has never smoked. She has never used smokeless tobacco. She reports that she does not drink alcohol and does not use drugs.  Allergies:  Allergies  Allergen Reactions   Iodinated Diagnostic Agents Itching   Doxycycline Nausea And Vomiting   Sulfa Antibiotics Rash and Hives   Codeine Hives and Swelling    Swollen tongue   Hydrocodone Bit-[place] Mbr Itching   Ace Inhibitors Cough, Itching and Rash    No current facility-administered medications on file prior to encounter.   Current Outpatient Medications on File Prior to Encounter  Medication Sig Dispense Refill   atorvastatin (LIPITOR) 80 MG tablet Take 1 tablet (80 mg total) by mouth at bedtime. 30 tablet 0   ELIQUIS 5 MG TABS tablet TAKE 1 TABLET BY MOUTH TWICE A DAY (Patient taking differently: Take 5 mg by mouth 2 (two) times daily.) 60 tablet 3   levETIRAcetam (KEPPRA) 500 MG tablet Take 1 tablet (500 mg total) by mouth 2 (two) times daily. 180 tablet 3   Multiple Vitamin (MULTIVITAMIN WITH MINERALS) TABS tablet Take 1 tablet by mouth daily.     omeprazole (PRILOSEC) 40 MG capsule Take 40 mg by mouth daily.     potassium chloride SA (KLOR-CON) 20 MEQ tablet Take 1 tablet (20 mEq total) by mouth daily. (Patient taking differently: Take 40 mEq by  mouth 2 (two) times daily.) 90 tablet 2   ursodiol (ACTIGALL) 250 MG tablet Take 250 mg by mouth 2 (two) times daily.     diphenhydrAMINE (BENADRYL) 50 MG tablet Take 1 tablet (50 mg total) by mouth at bedtime as needed for itching. Take Benadryl 50 mg at 7:30 am on 8/9 (with Prednisone 50 mg.) (Patient not taking: Reported on 05/10/2021) 30 tablet 0   predniSONE (DELTASONE) 50 MG tablet Pre medication for contrast allergy. Take 50 mg Prednisone at 7:30 pm on 8/8, at 1:30 am on 8/9; and 7:30 am on 8/9 (with Benadryl 50 mg at 7:30 am.) (Patient not taking: Reported on 05/10/2021) 3 tablet 0   ticagrelor (BRILINTA) 90 MG TABS tablet Take 1 tablet (90 mg total) by mouth 2 (two) times daily. 180 tablet 3   Vitamin D3 (VITAMIN D) 25 MCG tablet TAKE 1 TABLET (1,000 UNITS TOTAL) BY MOUTH DAILY BEFORE BREAKFAST. (Patient not taking: Reported on 05/10/2021) 60 tablet 0    Review of Systems: A full ROS was attempted today and was  not able to be performed due to aphasia.  Physical Examination: Temp:  [97.7 F (36.5 C)-98.9 F (37.2 C)] 98.9 F (37.2 C) (12/07 2016) Pulse Rate:  [56-64] 64 (12/08 0015) Resp:  [12-20] 16 (12/08 0015) BP: (96-129)/(61-80) 124/68 (12/08 0015) SpO2:  [99 %-100 %] 100 % (12/08 0015)  General - well nourished, well developed, in no apparent distress, mildly agitated and restless with excessive talking.    Ophthalmologic - fundi not visualized due to noncooperation.    Cardiovascular - regular rhythm and rate, not in A. fib  Neuro - awake, alert, eyes open, fluent language but with intermittent word salad, difficulty with comprehension, not able to follow simple commands but able to pantomime.  Able to repeat 3 word sentences but not 5 word sentences, naming 1/3.  Stated her age 13, not orientated to place or people.  No gaze palsy, tracking bilaterally, blinking to visual threat bilaterally. No facial droop. Tongue protrusion not corporative.  Bilateral UEs 5/5, no drift.  Bilaterally LEs 5/5, no drift. Sensation, coordination not corporative and gait not tested.   Data Reviewed: CT HEAD WO CONTRAST  Result Date: 05/10/2021 CLINICAL DATA:  Neurological deficit, acute, stroke suspected. Aphasia and confusion. EXAM: CT HEAD WITHOUT CONTRAST TECHNIQUE: Contiguous axial images were obtained from the base of the skull through the vertex without intravenous contrast. COMPARISON:  02/07/2021 FINDINGS: Brain: Previous left middle cerebral artery stent with old infarction within the left middle cerebral artery territory as seen previously. There is newly seen loss of gray-white differentiation at the right temporoparietal junction. There could be acute infarction in this location. No sign of hemorrhage or mass effect. No hydrocephalus or extra-axial fluid collection. Vascular: Left MCA stent as noted above. Skull: Negative Sinuses/Orbits: Clear/normal Other: None IMPRESSION: Old left MCA territory strokes. Newly seen loss of gray-white differentiation at the right temporoparietal junction suggesting acute infarction in that region today. No hemorrhage or mass effect. Electronically Signed   By: Nelson Chimes M.D.   On: 05/10/2021 12:43   MR ANGIO HEAD WO CONTRAST  Result Date: 05/10/2021 CLINICAL DATA:  Initial evaluation for neuro deficit, stroke suspected. Abnormal CT. EXAM: MRI HEAD WITHOUT CONTRAST MRA HEAD WITHOUT CONTRAST MRA NECK WITHOUT CONTRAST TECHNIQUE: Multiplanar, multiecho pulse sequences of the brain and surrounding structures were obtained without intravenous contrast. Angiographic images of the Circle of Willis were obtained using MRA technique without intravenous contrast. Angiographic images of the neck were obtained using MRA technique without intravenous contrast. Carotid stenosis measurements (when applicable) are obtained utilizing NASCET criteria, using the distal internal carotid diameter as the denominator. COMPARISON:  Prior head CT from earlier the same day  as well as earlier studies. FINDINGS: MRI HEAD FINDINGS Brain: Cerebral volume within normal limits for age. Chronic encephalomalacia and gliosis involving the left cerebral hemisphere consistent with chronic left MCA distribution infarcts. Scattered areas of chronic hemosiderin staining and/or laminar necrosis noted throughout these areas. Small remote right cerebellar infarct noted. Underlying mild chronic small vessel ischemic disease. Abnormal restricted diffusion involving the right temporal occipital junction consistent with an acute ischemic infarct, posterior right MCA distribution (series 5, image 72). Finding corresponds with abnormality on prior CT. No associated hemorrhage or mass effect. Few additional scattered small volume foci of diffusion abnormality involving the left frontal lobe (series 5, image 84) and left periatrial region (series 5, image 73) favored to be artifactual nature related underlying laminar necrosis and/or chronic blood products. Otherwise, gray-white matter differentiation maintained. No other areas of acute infarction.  No acute intracranial hemorrhage. 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 No other mass lesion. Mild right left deviation related to chronic left cerebral volume loss. No hydrocephalus or extra-axial fluid collection. Pituitary gland and suprasellar region normal. Midline structures intact. Vascular: Major intracranial vascular flow voids maintained. Vascular stent in place within the left M1 segment. Skull and upper cervical spine: Craniocervical junction within normal limits. Bone marrow signal intensity diffusely decreased on T1 weighted sequence, nonspecific, but most commonly related to anemia, smoking or obesity. No focal marrow replacing lesion. No scalp soft tissue abnormality. Sinuses/Orbits: Globes and orbital soft tissues within normal limits. Paranasal sinuses are clear. No mastoid effusion. Other: None. MRA HEAD FINDINGS ANTERIOR CIRCULATION:  Visualized distal cervical segments of the internal carotid arteries are patent with antegrade flow. Petrous segments patent bilaterally. Cavernous and supraclinoid left ICA widely patent. Short-segment approximate 50% stenosis at the supraclinoid right ICA, grossly similar to prior arteriogram (series 1027, image 10). Right M1 segment remains patent. Normal right MCA bifurcation. Probable severe proximal right M2 stenosis with associated flow gap (series 1033, image 15). This is similar as compared to prior MRA. Right MCA branches remain perfused distally, although demonstrates small vessel atheromatous irregularity. Vascular stent in place within the left M1 segment. Flow through the stent itself not evaluated by MRA. Probable short-segment severe left M2 stenosis, seen on prior MRA (series 5, image 113). Left MCA branches otherwise patent distally. A1 segments patent bilaterally. Normal anterior communicating artery complex. Anterior cerebral arteries remain widely patent. POSTERIOR CIRCULATION: Both vertebral arteries remain widely patent to the vertebrobasilar junction. Right PICA origin patent. Left PICA not seen. Basilar patent to its distal aspect without stenosis. Superior cerebellar arteries patent bilaterally. Both PCA supplied via hypoplastic P1 segments and robust bilateral posterior communicating arteries. PCAs remain widely patent to their distal aspects. No aneurysm. MRA NECK FINDINGS AORTIC ARCH: Visualized aortic arch normal caliber. Bovine branching pattern noted at the aortic arch. No hemodynamically significant stenosis about the origin of the great vessels. RIGHT CAROTID SYSTEM: Right common and internal carotid arteries patent without stenosis or evidence for dissection. Mild for age atheromatous irregularity about the right bifurcation without stenosis. LEFT CAROTID SYSTEM: Left common and internal carotid arteries patent without stenosis or evidence for dissection. Mild for age atheromatous  irregularity about the left bifurcation without stenosis. VERTEBRAL ARTERIES: Both vertebral arteries arise from subclavian arteries. Vertebral arteries are largely codominant and widely patent without stenosis or evidence for dissection. IMPRESSION: MRI HEAD: 1. Acute ischemic nonhemorrhagic infarct involving the right temporoccipital junction, posterior right MCA distribution. 2. Chronic left MCA territory infarcts, stable from prior. 3. Additional small remote right cerebellar infarct. 4. Underlying age-related cerebral atrophy with mild chronic small vessel ischemic disease. MRA HEAD: 1. Negative intracranial MRA for large vessel occlusion. 2. Approximate 50% stenosis at the supraclinoid right ICA, with probable additional short-segment severe proximal right M2 stenosis. 3. Vascular stent in place within the left M1 segment. Flow through the stent itself not evaluated by MRA, although patent flow seen distal to the stent. 4. Short-segment severe left M2 stenosis, similar to previous. 5. Wide patency of the posterior circulation. MRA NECK: Wide patency of both carotid artery systems and vertebral arteries within the neck. No hemodynamically significant stenosis or other acute vascular abnormality. Electronically Signed   By: Jeannine Boga M.D.   On: 05/10/2021 19:03   MR ANGIO NECK WO CONTRAST  Result Date: 05/10/2021 CLINICAL DATA:  Initial evaluation for neuro deficit, stroke suspected. Abnormal CT.  EXAM: MRI HEAD WITHOUT CONTRAST MRA HEAD WITHOUT CONTRAST MRA NECK WITHOUT CONTRAST TECHNIQUE: Multiplanar, multiecho pulse sequences of the brain and surrounding structures were obtained without intravenous contrast. Angiographic images of the Circle of Willis were obtained using MRA technique without intravenous contrast. Angiographic images of the neck were obtained using MRA technique without intravenous contrast. Carotid stenosis measurements (when applicable) are obtained utilizing NASCET criteria,  using the distal internal carotid diameter as the denominator. COMPARISON:  Prior head CT from earlier the same day as well as earlier studies. FINDINGS: MRI HEAD FINDINGS Brain: Cerebral volume within normal limits for age. Chronic encephalomalacia and gliosis involving the left cerebral hemisphere consistent with chronic left MCA distribution infarcts. Scattered areas of chronic hemosiderin staining and/or laminar necrosis noted throughout these areas. Small remote right cerebellar infarct noted. Underlying mild chronic small vessel ischemic disease. Abnormal restricted diffusion involving the right temporal occipital junction consistent with an acute ischemic infarct, posterior right MCA distribution (series 5, image 72). Finding corresponds with abnormality on prior CT. No associated hemorrhage or mass effect. Few additional scattered small volume foci of diffusion abnormality involving the left frontal lobe (series 5, image 84) and left periatrial region (series 5, image 73) favored to be artifactual nature related underlying laminar necrosis and/or chronic blood products. Otherwise, gray-white matter differentiation maintained. No other areas of acute infarction. No acute intracranial hemorrhage. 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 No other mass lesion. Mild right left deviation related to chronic left cerebral volume loss. No hydrocephalus or extra-axial fluid collection. Pituitary gland and suprasellar region normal. Midline structures intact. Vascular: Major intracranial vascular flow voids maintained. Vascular stent in place within the left M1 segment. Skull and upper cervical spine: Craniocervical junction within normal limits. Bone marrow signal intensity diffusely decreased on T1 weighted sequence, nonspecific, but most commonly related to anemia, smoking or obesity. No focal marrow replacing lesion. No scalp soft tissue abnormality. Sinuses/Orbits: Globes and orbital soft tissues within normal  limits. Paranasal sinuses are clear. No mastoid effusion. Other: None. MRA HEAD FINDINGS ANTERIOR CIRCULATION: Visualized distal cervical segments of the internal carotid arteries are patent with antegrade flow. Petrous segments patent bilaterally. Cavernous and supraclinoid left ICA widely patent. Short-segment approximate 50% stenosis at the supraclinoid right ICA, grossly similar to prior arteriogram (series 1027, image 10). Right M1 segment remains patent. Normal right MCA bifurcation. Probable severe proximal right M2 stenosis with associated flow gap (series 1033, image 15). This is similar as compared to prior MRA. Right MCA branches remain perfused distally, although demonstrates small vessel atheromatous irregularity. Vascular stent in place within the left M1 segment. Flow through the stent itself not evaluated by MRA. Probable short-segment severe left M2 stenosis, seen on prior MRA (series 5, image 113). Left MCA branches otherwise patent distally. A1 segments patent bilaterally. Normal anterior communicating artery complex. Anterior cerebral arteries remain widely patent. POSTERIOR CIRCULATION: Both vertebral arteries remain widely patent to the vertebrobasilar junction. Right PICA origin patent. Left PICA not seen. Basilar patent to its distal aspect without stenosis. Superior cerebellar arteries patent bilaterally. Both PCA supplied via hypoplastic P1 segments and robust bilateral posterior communicating arteries. PCAs remain widely patent to their distal aspects. No aneurysm. MRA NECK FINDINGS AORTIC ARCH: Visualized aortic arch normal caliber. Bovine branching pattern noted at the aortic arch. No hemodynamically significant stenosis about the origin of the great vessels. RIGHT CAROTID SYSTEM: Right common and internal carotid arteries patent without stenosis or evidence for dissection. Mild for age atheromatous irregularity about the  right bifurcation without stenosis. LEFT CAROTID SYSTEM: Left  common and internal carotid arteries patent without stenosis or evidence for dissection. Mild for age atheromatous irregularity about the left bifurcation without stenosis. VERTEBRAL ARTERIES: Both vertebral arteries arise from subclavian arteries. Vertebral arteries are largely codominant and widely patent without stenosis or evidence for dissection. IMPRESSION: MRI HEAD: 1. Acute ischemic nonhemorrhagic infarct involving the right temporoccipital junction, posterior right MCA distribution. 2. Chronic left MCA territory infarcts, stable from prior. 3. Additional small remote right cerebellar infarct. 4. Underlying age-related cerebral atrophy with mild chronic small vessel ischemic disease. MRA HEAD: 1. Negative intracranial MRA for large vessel occlusion. 2. Approximate 50% stenosis at the supraclinoid right ICA, with probable additional short-segment severe proximal right M2 stenosis. 3. Vascular stent in place within the left M1 segment. Flow through the stent itself not evaluated by MRA, although patent flow seen distal to the stent. 4. Short-segment severe left M2 stenosis, similar to previous. 5. Wide patency of the posterior circulation. MRA NECK: Wide patency of both carotid artery systems and vertebral arteries within the neck. No hemodynamically significant stenosis or other acute vascular abnormality. Electronically Signed   By: Jeannine Boga M.D.   On: 05/10/2021 19:03   MR BRAIN WO CONTRAST  Result Date: 05/10/2021 CLINICAL DATA:  Initial evaluation for neuro deficit, stroke suspected. Abnormal CT. EXAM: MRI HEAD WITHOUT CONTRAST MRA HEAD WITHOUT CONTRAST MRA NECK WITHOUT CONTRAST TECHNIQUE: Multiplanar, multiecho pulse sequences of the brain and surrounding structures were obtained without intravenous contrast. Angiographic images of the Circle of Willis were obtained using MRA technique without intravenous contrast. Angiographic images of the neck were obtained using MRA technique without  intravenous contrast. Carotid stenosis measurements (when applicable) are obtained utilizing NASCET criteria, using the distal internal carotid diameter as the denominator. COMPARISON:  Prior head CT from earlier the same day as well as earlier studies. FINDINGS: MRI HEAD FINDINGS Brain: Cerebral volume within normal limits for age. Chronic encephalomalacia and gliosis involving the left cerebral hemisphere consistent with chronic left MCA distribution infarcts. Scattered areas of chronic hemosiderin staining and/or laminar necrosis noted throughout these areas. Small remote right cerebellar infarct noted. Underlying mild chronic small vessel ischemic disease. Abnormal restricted diffusion involving the right temporal occipital junction consistent with an acute ischemic infarct, posterior right MCA distribution (series 5, image 72). Finding corresponds with abnormality on prior CT. No associated hemorrhage or mass effect. Few additional scattered small volume foci of diffusion abnormality involving the left frontal lobe (series 5, image 84) and left periatrial region (series 5, image 73) favored to be artifactual nature related underlying laminar necrosis and/or chronic blood products. Otherwise, gray-white matter differentiation maintained. No other areas of acute infarction. No acute intracranial hemorrhage. 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 No other mass lesion. Mild right left deviation related to chronic left cerebral volume loss. No hydrocephalus or extra-axial fluid collection. Pituitary gland and suprasellar region normal. Midline structures intact. Vascular: Major intracranial vascular flow voids maintained. Vascular stent in place within the left M1 segment. Skull and upper cervical spine: Craniocervical junction within normal limits. Bone marrow signal intensity diffusely decreased on T1 weighted sequence, nonspecific, but most commonly related to anemia, smoking or obesity. No focal marrow  replacing lesion. No scalp soft tissue abnormality. Sinuses/Orbits: Globes and orbital soft tissues within normal limits. Paranasal sinuses are clear. No mastoid effusion. Other: None. MRA HEAD FINDINGS ANTERIOR CIRCULATION: Visualized distal cervical segments of the internal carotid arteries are patent with antegrade flow. Petrous segments  patent bilaterally. Cavernous and supraclinoid left ICA widely patent. Short-segment approximate 50% stenosis at the supraclinoid right ICA, grossly similar to prior arteriogram (series 1027, image 10). Right M1 segment remains patent. Normal right MCA bifurcation. Probable severe proximal right M2 stenosis with associated flow gap (series 1033, image 15). This is similar as compared to prior MRA. Right MCA branches remain perfused distally, although demonstrates small vessel atheromatous irregularity. Vascular stent in place within the left M1 segment. Flow through the stent itself not evaluated by MRA. Probable short-segment severe left M2 stenosis, seen on prior MRA (series 5, image 113). Left MCA branches otherwise patent distally. A1 segments patent bilaterally. Normal anterior communicating artery complex. Anterior cerebral arteries remain widely patent. POSTERIOR CIRCULATION: Both vertebral arteries remain widely patent to the vertebrobasilar junction. Right PICA origin patent. Left PICA not seen. Basilar patent to its distal aspect without stenosis. Superior cerebellar arteries patent bilaterally. Both PCA supplied via hypoplastic P1 segments and robust bilateral posterior communicating arteries. PCAs remain widely patent to their distal aspects. No aneurysm. MRA NECK FINDINGS AORTIC ARCH: Visualized aortic arch normal caliber. Bovine branching pattern noted at the aortic arch. No hemodynamically significant stenosis about the origin of the great vessels. RIGHT CAROTID SYSTEM: Right common and internal carotid arteries patent without stenosis or evidence for dissection.  Mild for age atheromatous irregularity about the right bifurcation without stenosis. LEFT CAROTID SYSTEM: Left common and internal carotid arteries patent without stenosis or evidence for dissection. Mild for age atheromatous irregularity about the left bifurcation without stenosis. VERTEBRAL ARTERIES: Both vertebral arteries arise from subclavian arteries. Vertebral arteries are largely codominant and widely patent without stenosis or evidence for dissection. IMPRESSION: MRI HEAD: 1. Acute ischemic nonhemorrhagic infarct involving the right temporoccipital junction, posterior right MCA distribution. 2. Chronic left MCA territory infarcts, stable from prior. 3. Additional small remote right cerebellar infarct. 4. Underlying age-related cerebral atrophy with mild chronic small vessel ischemic disease. MRA HEAD: 1. Negative intracranial MRA for large vessel occlusion. 2. Approximate 50% stenosis at the supraclinoid right ICA, with probable additional short-segment severe proximal right M2 stenosis. 3. Vascular stent in place within the left M1 segment. Flow through the stent itself not evaluated by MRA, although patent flow seen distal to the stent. 4. Short-segment severe left M2 stenosis, similar to previous. 5. Wide patency of the posterior circulation. MRA NECK: Wide patency of both carotid artery systems and vertebral arteries within the neck. No hemodynamically significant stenosis or other acute vascular abnormality. Electronically Signed   By: Jeannine Boga M.D.   On: 05/10/2021 19:03    Assessment: 65 y.o. 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  She had left MCA stroke in 06/2020 status post IR and left MCA stenting.  Was on aspirin and Brilinta, Loop recorder found A. fib in 10/2020, started on Eliquis.  Had a seizure activity 06/2020 and  02/2021, currently on Keppra.  Per brother, patient recovered well since last stroke.  This time admitted for slurred speech, confusion, and headache.  CT head old left MCA infarct however new right temporal parietal infarct.  MRI confirmed right MCA temporal occipital junction moderate infarct.  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  Tonight, pt again complained of HA, received tylenol and HA improving. However, on exam, pt  slightly agitated, fluent language with intermittent word salad, difficulty with comprehension. Per brother, this is new change tonight. Pt had eliquis 10:27pm tonight, will check CT to rule out hemorrhagic conversion.   Patient current symptoms could be recrudescence of chronic left MCA stroke in the setting of new right MCA stroke.  Etiology for patient new right MCA stroke not quite clear, likely related to right ICA siphon and right M2 severe stenosis.  She was on Eliquis prior to admission, okay to continue at this time, but also recommend add aspirin 81 on top of Eliquis for further stroke prevention if repeat CT no hemorrhagic conversion.  Continue Keppra and statin.  Stroke Risk Factors - atrial fibrillation, diabetes mellitus, hyperlipidemia, and hypertension  Plan: Continue further stroke work up  Frequent neuro checks Telemetry monitoring CT repeat stat to rule out hemorrhagic conversion given headache and speech changes after Eliquis Echocardiogram  UDS, fasting lipid panel and HgbA1C speech consult BP goal normotensive, avoid low BP If repeat CT no hemorrhagic conversion, okay to continue Eliquis, but also recommend at aspirin 81 on top of Eliquis for further stroke retention given multifocal intracranial stenosis.   Continue Keppra and statin Discussed with primary team Stroke team will follow in a.m.   Thank you for this consultation and  allowing [place] to participate in the care of this patient.  Rosalin Hawking, MD PhD Stroke Neurology 05/11/2021 1:13 AM

## 2021-05-11 NOTE — Progress Notes (Addendum)
STROKE TEAM PROGRESS NOTE   INTERVAL HISTORY Patient is laying in bed. No family at bedside. EEG tech in room to apply leads. Patient is awake and alert, has fluent aphasia and receptive aphasia. She is able to name objects, follow one step simple commands and perseverates. She is able to move all extremities equally. Overnight events reviewed. No new neurological events noted. RN at bedside  MRI scan of the brain shows right MCA branch infarct and MR angiogram showed stable right ICA siphon and right M2 stenosis. Vitals:   05/11/21 0600 05/11/21 0700 05/11/21 0815 05/11/21 1128  BP: 123/68  117/71 112/71  Pulse: 61 (!) 56 (!) 55 62  Resp: 17 (!) 22 11   Temp:   98.4 F (36.9 C) 98 F (36.7 C)  TempSrc:    Oral  SpO2: 100% 99% 100% 98%   CBC:  Recent Labs  Lab 05/10/21 1127 05/10/21 1136 05/11/21 0416  WBC 4.5  --  3.8*  NEUTROABS 2.0  --   --   HGB 11.1* 11.6* 10.2*  HCT 33.4* 34.0* 29.4*  MCV 85.4  --  83.1  PLT 366  --  233   Basic Metabolic Panel:  Recent Labs  Lab 05/10/21 1127 05/10/21 1136 05/10/21 1308 05/11/21 0416  NA 136 138  --  136  K 5.1 5.2*  --  4.4  CL 114* 114*  --  114*  CO2 17*  --   --  16*  GLUCOSE 84 80  --  82  BUN 21 26*  --  16  CREATININE 1.82* 2.00*  --  1.43*  CALCIUM 9.2  --   --  9.0  MG  --   --  1.7  --    Lipid Panel:  Recent Labs  Lab 05/11/21 0416  CHOL 82  TRIG 62  HDL 49  CHOLHDL 1.7  VLDL 12  LDLCALC 21   HgbA1c:  Recent Labs  Lab 05/11/21 0416  HGBA1C 5.5   Urine Drug Screen: No results for input(s): LABOPIA, COCAINSCRNUR, LABBENZ, AMPHETMU, THCU, LABBARB in the last 168 hours.  Alcohol Level No results for input(s): ETH in the last 168 hours.  IMAGING past 24 hours CT HEAD WO CONTRAST (5MM)  Result Date: 05/11/2021 CLINICAL DATA:  Follow-up examination for acute stroke. EXAM: CT HEAD WITHOUT CONTRAST TECHNIQUE: Contiguous axial images were obtained from the base of the skull through the vertex without  intravenous contrast. COMPARISON:  Previous CT and MRI from 05/10/2021. FINDINGS: Brain: Previously identified acute ischemic infarct involving the right temporal occipital junction again seen, relatively stable in size and morphology as compared to previous MRI. No evidence for hemorrhagic transformation or other complication. No significant regional mass effect. No other new or acute large vessel territory infarct. Chronic left MCA distribution infarcts again noted. Small remote right cerebellar infarct noted as well. No acute intracranial hemorrhage. No mass lesion or new midline shift. No hydrocephalus or extra-axial fluid collection. Vascular: Vascular stent in place within the left M1 segment. No hyperdense vessel. Skull: Scalp soft tissues and calvarium demonstrate no new abnormality. Sinuses/Orbits: Globes and orbital soft tissues demonstrate no acute finding. Paranasal sinuses mastoid air cells remain clear. Other: None. IMPRESSION: 1. No significant interval change in size and morphology of acute ischemic infarct involving the right temporoccipital junction. No evidence for hemorrhagic transformation or other complication. 2. No other new acute intracranial abnormality. Electronically Signed   By: Jeannine Boga M.D.   On: 05/11/2021 02:56   MR  ANGIO HEAD WO CONTRAST  Result Date: 05/10/2021 CLINICAL DATA:  Initial evaluation for neuro deficit, stroke suspected. Abnormal CT. EXAM: MRI HEAD WITHOUT CONTRAST MRA HEAD WITHOUT CONTRAST MRA NECK WITHOUT CONTRAST TECHNIQUE: Multiplanar, multiecho pulse sequences of the brain and surrounding structures were obtained without intravenous contrast. Angiographic images of the Circle of Willis were obtained using MRA technique without intravenous contrast. Angiographic images of the neck were obtained using MRA technique without intravenous contrast. Carotid stenosis measurements (when applicable) are obtained utilizing NASCET criteria, using the distal  internal carotid diameter as the denominator. COMPARISON:  Prior head CT from earlier the same day as well as earlier studies. FINDINGS: MRI HEAD FINDINGS Brain: Cerebral volume within normal limits for age. Chronic encephalomalacia and gliosis involving the left cerebral hemisphere consistent with chronic left MCA distribution infarcts. Scattered areas of chronic hemosiderin staining and/or laminar necrosis noted throughout these areas. Small remote right cerebellar infarct noted. Underlying mild chronic small vessel ischemic disease. Abnormal restricted diffusion involving the right temporal occipital junction consistent with an acute ischemic infarct, posterior right MCA distribution (series 5, image 72). Finding corresponds with abnormality on prior CT. No associated hemorrhage or mass effect. Few additional scattered small volume foci of diffusion abnormality involving the left frontal lobe (series 5, image 84) and left periatrial region (series 5, image 73) favored to be artifactual nature related underlying laminar necrosis and/or chronic blood products. Otherwise, gray-white matter differentiation maintained. No other areas of acute infarction. No acute intracranial hemorrhage. 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 No other mass lesion. Mild right left deviation related to chronic left cerebral volume loss. No hydrocephalus or extra-axial fluid collection. Pituitary gland and suprasellar region normal. Midline structures intact. Vascular: Major intracranial vascular flow voids maintained. Vascular stent in place within the left M1 segment. Skull and upper cervical spine: Craniocervical junction within normal limits. Bone marrow signal intensity diffusely decreased on T1 weighted sequence, nonspecific, but most commonly related to anemia, smoking or obesity. No focal marrow replacing lesion. No scalp soft tissue abnormality. Sinuses/Orbits: Globes and orbital soft tissues within normal limits. Paranasal  sinuses are clear. No mastoid effusion. Other: None. MRA HEAD FINDINGS ANTERIOR CIRCULATION: Visualized distal cervical segments of the internal carotid arteries are patent with antegrade flow. Petrous segments patent bilaterally. Cavernous and supraclinoid left ICA widely patent. Short-segment approximate 50% stenosis at the supraclinoid right ICA, grossly similar to prior arteriogram (series 1027, image 10). Right M1 segment remains patent. Normal right MCA bifurcation. Probable severe proximal right M2 stenosis with associated flow gap (series 1033, image 15). This is similar as compared to prior MRA. Right MCA branches remain perfused distally, although demonstrates small vessel atheromatous irregularity. Vascular stent in place within the left M1 segment. Flow through the stent itself not evaluated by MRA. Probable short-segment severe left M2 stenosis, seen on prior MRA (series 5, image 113). Left MCA branches otherwise patent distally. A1 segments patent bilaterally. Normal anterior communicating artery complex. Anterior cerebral arteries remain widely patent. POSTERIOR CIRCULATION: Both vertebral arteries remain widely patent to the vertebrobasilar junction. Right PICA origin patent. Left PICA not seen. Basilar patent to its distal aspect without stenosis. Superior cerebellar arteries patent bilaterally. Both PCA supplied via hypoplastic P1 segments and robust bilateral posterior communicating arteries. PCAs remain widely patent to their distal aspects. No aneurysm. MRA NECK FINDINGS AORTIC ARCH: Visualized aortic arch normal caliber. Bovine branching pattern noted at the aortic arch. No hemodynamically significant stenosis about the origin of the great vessels. RIGHT CAROTID SYSTEM:  Right common and internal carotid arteries patent without stenosis or evidence for dissection. Mild for age atheromatous irregularity about the right bifurcation without stenosis. LEFT CAROTID SYSTEM: Left common and internal  carotid arteries patent without stenosis or evidence for dissection. Mild for age atheromatous irregularity about the left bifurcation without stenosis. VERTEBRAL ARTERIES: Both vertebral arteries arise from subclavian arteries. Vertebral arteries are largely codominant and widely patent without stenosis or evidence for dissection. IMPRESSION: MRI HEAD: 1. Acute ischemic nonhemorrhagic infarct involving the right temporoccipital junction, posterior right MCA distribution. 2. Chronic left MCA territory infarcts, stable from prior. 3. Additional small remote right cerebellar infarct. 4. Underlying age-related cerebral atrophy with mild chronic small vessel ischemic disease. MRA HEAD: 1. Negative intracranial MRA for large vessel occlusion. 2. Approximate 50% stenosis at the supraclinoid right ICA, with probable additional short-segment severe proximal right M2 stenosis. 3. Vascular stent in place within the left M1 segment. Flow through the stent itself not evaluated by MRA, although patent flow seen distal to the stent. 4. Short-segment severe left M2 stenosis, similar to previous. 5. Wide patency of the posterior circulation. MRA NECK: Wide patency of both carotid artery systems and vertebral arteries within the neck. No hemodynamically significant stenosis or other acute vascular abnormality. Electronically Signed   By: Jeannine Boga M.D.   On: 05/10/2021 19:03   MR ANGIO NECK WO CONTRAST  Result Date: 05/10/2021 CLINICAL DATA:  Initial evaluation for neuro deficit, stroke suspected. Abnormal CT. EXAM: MRI HEAD WITHOUT CONTRAST MRA HEAD WITHOUT CONTRAST MRA NECK WITHOUT CONTRAST TECHNIQUE: Multiplanar, multiecho pulse sequences of the brain and surrounding structures were obtained without intravenous contrast. Angiographic images of the Circle of Willis were obtained using MRA technique without intravenous contrast. Angiographic images of the neck were obtained using MRA technique without intravenous  contrast. Carotid stenosis measurements (when applicable) are obtained utilizing NASCET criteria, using the distal internal carotid diameter as the denominator. COMPARISON:  Prior head CT from earlier the same day as well as earlier studies. FINDINGS: MRI HEAD FINDINGS Brain: Cerebral volume within normal limits for age. Chronic encephalomalacia and gliosis involving the left cerebral hemisphere consistent with chronic left MCA distribution infarcts. Scattered areas of chronic hemosiderin staining and/or laminar necrosis noted throughout these areas. Small remote right cerebellar infarct noted. Underlying mild chronic small vessel ischemic disease. Abnormal restricted diffusion involving the right temporal occipital junction consistent with an acute ischemic infarct, posterior right MCA distribution (series 5, image 72). Finding corresponds with abnormality on prior CT. No associated hemorrhage or mass effect. Few additional scattered small volume foci of diffusion abnormality involving the left frontal lobe (series 5, image 84) and left periatrial region (series 5, image 73) favored to be artifactual nature related underlying laminar necrosis and/or chronic blood products. Otherwise, gray-white matter differentiation maintained. No other areas of acute infarction. No acute intracranial hemorrhage. 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 No other mass lesion. Mild right left deviation related to chronic left cerebral volume loss. No hydrocephalus or extra-axial fluid collection. Pituitary gland and suprasellar region normal. Midline structures intact. Vascular: Major intracranial vascular flow voids maintained. Vascular stent in place within the left M1 segment. Skull and upper cervical spine: Craniocervical junction within normal limits. Bone marrow signal intensity diffusely decreased on T1 weighted sequence, nonspecific, but most commonly related to anemia, smoking or obesity. No focal marrow replacing lesion.  No scalp soft tissue abnormality. Sinuses/Orbits: Globes and orbital soft tissues within normal limits. Paranasal sinuses are clear. No mastoid effusion. Other: None.  MRA HEAD FINDINGS ANTERIOR CIRCULATION: Visualized distal cervical segments of the internal carotid arteries are patent with antegrade flow. Petrous segments patent bilaterally. Cavernous and supraclinoid left ICA widely patent. Short-segment approximate 50% stenosis at the supraclinoid right ICA, grossly similar to prior arteriogram (series 1027, image 10). Right M1 segment remains patent. Normal right MCA bifurcation. Probable severe proximal right M2 stenosis with associated flow gap (series 1033, image 15). This is similar as compared to prior MRA. Right MCA branches remain perfused distally, although demonstrates small vessel atheromatous irregularity. Vascular stent in place within the left M1 segment. Flow through the stent itself not evaluated by MRA. Probable short-segment severe left M2 stenosis, seen on prior MRA (series 5, image 113). Left MCA branches otherwise patent distally. A1 segments patent bilaterally. Normal anterior communicating artery complex. Anterior cerebral arteries remain widely patent. POSTERIOR CIRCULATION: Both vertebral arteries remain widely patent to the vertebrobasilar junction. Right PICA origin patent. Left PICA not seen. Basilar patent to its distal aspect without stenosis. Superior cerebellar arteries patent bilaterally. Both PCA supplied via hypoplastic P1 segments and robust bilateral posterior communicating arteries. PCAs remain widely patent to their distal aspects. No aneurysm. MRA NECK FINDINGS AORTIC ARCH: Visualized aortic arch normal caliber. Bovine branching pattern noted at the aortic arch. No hemodynamically significant stenosis about the origin of the great vessels. RIGHT CAROTID SYSTEM: Right common and internal carotid arteries patent without stenosis or evidence for dissection. Mild for age  atheromatous irregularity about the right bifurcation without stenosis. LEFT CAROTID SYSTEM: Left common and internal carotid arteries patent without stenosis or evidence for dissection. Mild for age atheromatous irregularity about the left bifurcation without stenosis. VERTEBRAL ARTERIES: Both vertebral arteries arise from subclavian arteries. Vertebral arteries are largely codominant and widely patent without stenosis or evidence for dissection. IMPRESSION: MRI HEAD: 1. Acute ischemic nonhemorrhagic infarct involving the right temporoccipital junction, posterior right MCA distribution. 2. Chronic left MCA territory infarcts, stable from prior. 3. Additional small remote right cerebellar infarct. 4. Underlying age-related cerebral atrophy with mild chronic small vessel ischemic disease. MRA HEAD: 1. Negative intracranial MRA for large vessel occlusion. 2. Approximate 50% stenosis at the supraclinoid right ICA, with probable additional short-segment severe proximal right M2 stenosis. 3. Vascular stent in place within the left M1 segment. Flow through the stent itself not evaluated by MRA, although patent flow seen distal to the stent. 4. Short-segment severe left M2 stenosis, similar to previous. 5. Wide patency of the posterior circulation. MRA NECK: Wide patency of both carotid artery systems and vertebral arteries within the neck. No hemodynamically significant stenosis or other acute vascular abnormality. Electronically Signed   By: Jeannine Boga M.D.   On: 05/10/2021 19:03   MR BRAIN WO CONTRAST  Result Date: 05/10/2021 CLINICAL DATA:  Initial evaluation for neuro deficit, stroke suspected. Abnormal CT. EXAM: MRI HEAD WITHOUT CONTRAST MRA HEAD WITHOUT CONTRAST MRA NECK WITHOUT CONTRAST TECHNIQUE: Multiplanar, multiecho pulse sequences of the brain and surrounding structures were obtained without intravenous contrast. Angiographic images of the Circle of Willis were obtained using MRA technique  without intravenous contrast. Angiographic images of the neck were obtained using MRA technique without intravenous contrast. Carotid stenosis measurements (when applicable) are obtained utilizing NASCET criteria, using the distal internal carotid diameter as the denominator. COMPARISON:  Prior head CT from earlier the same day as well as earlier studies. FINDINGS: MRI HEAD FINDINGS Brain: Cerebral volume within normal limits for age. Chronic encephalomalacia and gliosis involving the left cerebral hemisphere consistent with chronic left  MCA distribution infarcts. Scattered areas of chronic hemosiderin staining and/or laminar necrosis noted throughout these areas. Small remote right cerebellar infarct noted. Underlying mild chronic small vessel ischemic disease. Abnormal restricted diffusion involving the right temporal occipital junction consistent with an acute ischemic infarct, posterior right MCA distribution (series 5, image 72). Finding corresponds with abnormality on prior CT. No associated hemorrhage or mass effect. Few additional scattered small volume foci of diffusion abnormality involving the left frontal lobe (series 5, image 84) and left periatrial region (series 5, image 73) favored to be artifactual nature related underlying laminar necrosis and/or chronic blood products. Otherwise, gray-white matter differentiation maintained. No other areas of acute infarction. No acute intracranial hemorrhage. 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 No other mass lesion. Mild right left deviation related to chronic left cerebral volume loss. No hydrocephalus or extra-axial fluid collection. Pituitary gland and suprasellar region normal. Midline structures intact. Vascular: Major intracranial vascular flow voids maintained. Vascular stent in place within the left M1 segment. Skull and upper cervical spine: Craniocervical junction within normal limits. Bone marrow signal intensity diffusely decreased on T1  weighted sequence, nonspecific, but most commonly related to anemia, smoking or obesity. No focal marrow replacing lesion. No scalp soft tissue abnormality. Sinuses/Orbits: Globes and orbital soft tissues within normal limits. Paranasal sinuses are clear. No mastoid effusion. Other: None. MRA HEAD FINDINGS ANTERIOR CIRCULATION: Visualized distal cervical segments of the internal carotid arteries are patent with antegrade flow. Petrous segments patent bilaterally. Cavernous and supraclinoid left ICA widely patent. Short-segment approximate 50% stenosis at the supraclinoid right ICA, grossly similar to prior arteriogram (series 1027, image 10). Right M1 segment remains patent. Normal right MCA bifurcation. Probable severe proximal right M2 stenosis with associated flow gap (series 1033, image 15). This is similar as compared to prior MRA. Right MCA branches remain perfused distally, although demonstrates small vessel atheromatous irregularity. Vascular stent in place within the left M1 segment. Flow through the stent itself not evaluated by MRA. Probable short-segment severe left M2 stenosis, seen on prior MRA (series 5, image 113). Left MCA branches otherwise patent distally. A1 segments patent bilaterally. Normal anterior communicating artery complex. Anterior cerebral arteries remain widely patent. POSTERIOR CIRCULATION: Both vertebral arteries remain widely patent to the vertebrobasilar junction. Right PICA origin patent. Left PICA not seen. Basilar patent to its distal aspect without stenosis. Superior cerebellar arteries patent bilaterally. Both PCA supplied via hypoplastic P1 segments and robust bilateral posterior communicating arteries. PCAs remain widely patent to their distal aspects. No aneurysm. MRA NECK FINDINGS AORTIC ARCH: Visualized aortic arch normal caliber. Bovine branching pattern noted at the aortic arch. No hemodynamically significant stenosis about the origin of the great vessels. RIGHT CAROTID  SYSTEM: Right common and internal carotid arteries patent without stenosis or evidence for dissection. Mild for age atheromatous irregularity about the right bifurcation without stenosis. LEFT CAROTID SYSTEM: Left common and internal carotid arteries patent without stenosis or evidence for dissection. Mild for age atheromatous irregularity about the left bifurcation without stenosis. VERTEBRAL ARTERIES: Both vertebral arteries arise from subclavian arteries. Vertebral arteries are largely codominant and widely patent without stenosis or evidence for dissection. IMPRESSION: MRI HEAD: 1. Acute ischemic nonhemorrhagic infarct involving the right temporoccipital junction, posterior right MCA distribution. 2. Chronic left MCA territory infarcts, stable from prior. 3. Additional small remote right cerebellar infarct. 4. Underlying age-related cerebral atrophy with mild chronic small vessel ischemic disease. MRA HEAD: 1. Negative intracranial MRA for large vessel occlusion. 2. Approximate 50% stenosis at the supraclinoid  right ICA, with probable additional short-segment severe proximal right M2 stenosis. 3. Vascular stent in place within the left M1 segment. Flow through the stent itself not evaluated by MRA, although patent flow seen distal to the stent. 4. Short-segment severe left M2 stenosis, similar to previous. 5. Wide patency of the posterior circulation. MRA NECK: Wide patency of both carotid artery systems and vertebral arteries within the neck. No hemodynamically significant stenosis or other acute vascular abnormality. Electronically Signed   By: Jeannine Boga M.D.   On: 05/10/2021 19:03   EEG adult  Result Date: 05/11/2021 Lora Havens, MD     05/11/2021  1:21 PM Patient Name: Courtney Burke MRN: [id number] Epilepsy Attending: Lora Havens Referring Physician/Provider: Dr Lalla Brothers Date: 05/11/2021 Duration: 25.37 mins Patient history: 65 year old female with history of seizures and  temporoparietal CVA.  EEG to evaluate for seizure. Level of alertness: Awake, asleep AEDs during EEG study: Ke[place]ra Technical aspects: This EEG study was done with scalp electrodes positioned according to the 10-20 International system of electrode placement. Electrical activity was acquired at a sampling rate of 500Hz  and reviewed with a high frequency filter of 70Hz  and a low frequency filter of 1Hz . EEG data were recorded continuously and digitally stored. Description: The posterior dominant rhythm consists of 8-9Hz  activity of moderate voltage (25-35 uV) seen predominantly in posterior head regions, symmetric and reactive to eye opening and eye closing. Sleep was characterized by vertex waves, sleep spindles (12 to 14 Hz), maximal frontocentral region.  EEG showed intermittent generalized  3 to 6 Hz theta-delta slowing. Hyperventilation and photic stimulation were not performed.   ABNORMALITY - Intermittent slow, generalized IMPRESSION: This study is suggestive of mild diffuse encephalopathy, nonspecific etiology. No seizures or epileptiform discharges were seen throughout the recording. Lora Havens   ECHOCARDIOGRAM COMPLETE  Result Date: 05/11/2021    ECHOCARDIOGRAM REPORT   Patient Name:   [facility] Date of Exam: 05/11/2021 Medical Rec #:  [id number]            Height:       62.0 in Accession #:    [id number]           Weight:       157.0 lb Date of Birth:  28-Oct-1955            BSA:          1.725 m Patient Age:    65 years             BP:           123/68 mmHg Patient Gender: F                    HR:           62 bpm. Exam Location:  Inpatient Procedure: 2D Echo, 3D Echo, Cardiac Doppler and Color Doppler Indications:    Stroke  History:        Patient has no prior history of Echocardiogram examinations.                 Signs/Symptoms:Altered Mental Status; Risk Factors:Hypertension,                 Diabetes and Dyslipidemia.  Sonographer:    Roseanna Rainbow RDCS Referring Phys: [id number] [facility]  Sonographer Comments: Patient could not follow directions. IMPRESSIONS  1. Left ventricular ejection fraction, by estimation, is 55 to 60%. Left ventricular ejection fraction by 3D volume  is 58 %. The left ventricle has normal function. The left ventricle has no regional wall motion abnormalities. There is moderate asymmetric left ventricular hypertrophy of the basal-septal segment. Left ventricular diastolic parameters are consistent with Grade I diastolic dysfunction (impaired relaxation).  2. Right ventricular systolic function is normal. The right ventricular size is normal. There is mildly elevated pulmonary artery systolic pressure. The estimated right ventricular systolic pressure is 99.3 mmHg.  3. The mitral valve is degenerative. Trivial mitral valve regurgitation. No evidence of mitral stenosis.  4. Tricuspid valve regurgitation is mild to moderate.  5. The aortic valve is tricuspid. There is mild calcification of the aortic valve. There is mild thickening of the aortic valve. Aortic valve regurgitation is not visualized. Aortic valve sclerosis/calcification is present, without any evidence of aortic stenosis.  6. The inferior vena cava is normal in size with <50% respiratory variability, suggesting right atrial pressure of 8 mmHg. Comparison(s): No prior Echocardiogram. Conclusion(s)/Recommendation(s): No intracardiac source of embolism detected on this transthoracic study. Consider a transesophageal echocardiogram to exclude cardiac source of embolism if clinically indicated. FINDINGS  Left Ventricle: Left ventricular ejection fraction, by estimation, is 55 to 60%. Left ventricular ejection fraction by 3D volume is 58 %. The left ventricle has normal function. The left ventricle has no regional wall motion abnormalities. The left ventricular internal cavity size was normal in size. There is moderate asymmetric left ventricular hypertrophy of the basal-septal segment. Left ventricular diastolic  parameters are consistent with Grade I diastolic dysfunction (impaired relaxation). Right Ventricle: The right ventricular size is normal. No increase in right ventricular wall thickness. Right ventricular systolic function is normal. There is mildly elevated pulmonary artery systolic pressure. The tricuspid regurgitant velocity is 2.65  m/s, and with an assumed right atrial pressure of 8 mmHg, the estimated right ventricular systolic pressure is 57.0 mmHg. Left Atrium: Left atrial size was normal in size. Right Atrium: Right atrial size was normal in size. Pericardium: There is no evidence of pericardial effusion. Mitral Valve: The mitral valve is degenerative in appearance. There is mild thickening of the mitral valve leaflet(s). There is mild calcification of the mitral valve leaflet(s). Mild mitral annular calcification. Trivial mitral valve regurgitation. No evidence of mitral valve stenosis. Tricuspid Valve: The tricuspid valve is normal in structure. Tricuspid valve regurgitation is mild to moderate. Aortic Valve: The aortic valve is tricuspid. There is mild calcification of the aortic valve. There is mild thickening of the aortic valve. Aortic valve regurgitation is not visualized. Aortic valve sclerosis/calcification is present, without any evidence of aortic stenosis. Pulmonic Valve: The pulmonic valve was normal in structure. Pulmonic valve regurgitation is trivial. Aorta: The aortic root and ascending aorta are structurally normal, with no evidence of dilitation. Venous: The inferior vena cava is normal in size with less than 50% respiratory variability, suggesting right atrial pressure of 8 mmHg. IAS/Shunts: The interatrial septum is aneurysmal. No atrial level shunt detected by color flow Doppler.  LEFT VENTRICLE PLAX 2D LVIDd:         3.90 cm         Diastology LVIDs:         2.50 cm         LV e' medial:    7.18 cm/s LV PW:         1.00 cm         LV E/e' medial:  11.9 LV IVS:        1.00 cm  LV e' lateral:   14.10 cm/s LVOT diam:     2.00 cm         LV E/e' lateral: 6.1 LV SV:         84 LV SV Index:   49 LVOT Area:     3.14 cm        3D Volume EF                                LV 3D EF:    Left                                             ventricul LV Volumes (MOD)                            ar LV vol d, MOD    73.8 ml                    ejection A2C:                                        fraction LV vol d, MOD    61.8 ml                    by 3D A4C:                                        volume is LV vol s, MOD    31.3 ml                    58 %. A2C: LV vol s, MOD    25.1 ml A4C:                           3D Volume EF: LV SV MOD A2C:   42.5 ml       3D EF:        58 % LV SV MOD A4C:   61.8 ml       LV EDV:       113 ml LV SV MOD BP:    41.0 ml       LV ESV:       48 ml                                LV SV:        65 ml RIGHT VENTRICLE            IVC RV S prime:     8.81 cm/s  IVC diam: 1.90 cm TAPSE (M-mode): 1.7 cm LEFT ATRIUM             Index        RIGHT ATRIUM           Index LA diam:        3.60 cm 2.09 cm/m   RA Area:     13.80 cm LA Vol (A2C):   36.5  ml 21.16 ml/m  RA Volume:   35.00 ml  20.29 ml/m LA Vol (A4C):   24.2 ml 14.03 ml/m LA Biplane Vol: 30.6 ml 17.74 ml/m  AORTIC VALVE LVOT Vmax:   124.00 cm/s LVOT Vmean:  78.600 cm/s LVOT VTI:    0.267 m  AORTA Ao Root diam: 2.70 cm Ao Asc diam:  3.15 cm MITRAL VALVE                TRICUSPID VALVE MV Area (PHT): 2.95 cm     TR Peak grad:   28.1 mmHg MV Decel Time: 257 msec     TR Vmax:        265.00 cm/s MV E velocity: 85.40 cm/s MV A velocity: 115.00 cm/s  SHUNTS MV E/A ratio:  0.74         Systemic VTI:  0.27 m                             Systemic Diam: 2.00 cm Gwyndolyn Kaufman MD Electronically signed by Gwyndolyn Kaufman MD Signature Date/Time: 05/11/2021/2:44:05 PM    Final     PHYSICAL EXAM  Temp:  [98 F (36.7 C)-98.9 F (37.2 C)] 98 F (36.7 C) (12/08 1128) Pulse Rate:  [53-64] 62 (12/08 1128) Resp:  [6-22] 11 (12/08  0815) BP: (99-129)/(56-78) 112/71 (12/08 1128) SpO2:  [98 %-100 %] 98 % (12/08 1128)  General - Well nourished, well developed, middle-aged African-American lady in no apparent distress.  Ophthalmologic - fundi not visualized due to noncooperation.  Cardiovascular - Regular rhythm and rate.  Mental Status -  She is awake and alert. She has mixed fluent  receptive aphasia.  Mild paraphasic errors and some echolalia she is confused able to state her name. She can name objects and follow one step simple commands and perseverates. Comprehension is not intact and not able to give a coherent story.    Cranial Nerves II - XII - II - Visual field intact OU. III, IV, VI - Extraocular movements intact. V - Facial sensation intact bilaterally. VII - Facial movement intact bilaterally. VIII - Hearing & vestibular intact bilaterally. X - Palate elevates symmetrically. XI - Chin turning & shoulder shrug intact bilaterally. XII - Tongue protrusion intact.  Motor Strength - The patient's strength was normal in all extremities and pronator drift was absent.  Bulk was normal and fasciculations were absent.   Motor Tone - Muscle tone was assessed at the neck and appendages and was normal.  Sensory - Light touch, temperature/pinprick were assessed and were symmetrical.    Coordination - unable to assess  Gait and Station - deferred.   ASSESSMENT/PLAN Courtney Burke is a 65 y.o. 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  She had left MCA stroke in 06/2020 status post IR and left MCA stenting.  Was on aspirin and Brilinta, Loop recorder found A. fib in 10/2020, started on Eliquis.  Had a seizure activity 06/2020 and 02/2021, currently on Keppra. Presented to the ED for slurred speech, confusion, and headache  Stroke:  Acute right MCA  embolic infarct  likely secondary Atrial Fibrillation  CT head  Old left MCA territory strokes. Newly seen loss of gray-white differentiation at the right temporoparietal junction suggesting acute infarction in that region today. No hemorrhage or mass effect  MRI  1. Acute ischemic nonhemorrhagic infarct involving the right temporoccipital junction, posterior right MCA distribution. 2. Chronic left MCA territory infarcts, stable from prior. 3. Additional small remote right cerebellar infarct. 4. Underlying age-related cerebral atrophy with mild chronic small vessel ischemic disease.   MRA  head 1. Negative intracranial MRA for large vessel occlusion. 2. Approximate 50% stenosis at the supraclinoid right ICA, with probable additional short-segment severe proximal right M2 stenosis. 3. Vascular stent in place within the left M1 segment. Flow through the stent itself not evaluated by MRA, although patent flow seen distal to the stent. 4. Short-segment severe left M2 stenosis, similar to previous. 5. Wide patency of the posterior circulation  MRA Neck  Wide patency of both carotid artery systems and vertebral arteries within the neck. No hemodynamically significant stenosis or other acute vascular abnormality.  2D Echo  1. Left ventricular ejection fraction, by estimation, is 55 to 60%. Left ventricular ejection fraction by 3D volume is 58 %. The left ventricle has normal function. The left ventricle has no regional wall motion  abnormalities. There is moderate asymmetric left ventricular hypertrophy of the basal-septal segment. Left ventricular diastolic parameters are consistent with Grade I diastolic dysfunction (impaired relaxation).   2. Right ventricular systolic function is normal. The right ventricular size is normal. There is mildly elevated pulmonary artery systolic pressure. The estimated right ventricular systolic pressure is 78.2 mmHg.   3. The mitral valve is degenerative. Trivial  mitral valve regurgitation. No evidence of mitral stenosis.   4. Tricuspid valve regurgitation is mild to moderate.   5. The aortic valve is tricuspid. There is mild calcification of the aortic valve. There is mild thickening of the aortic valve. Aortic valve regurgitation is not visualized. Aortic valve sclerosis/calcification is  present, without any evidence of aortic stenosis.   6. The inferior vena cava is normal in size with <50% respiratory variability, suggesting right atrial pressure of 8 mmHg.   EEG generalized slowing. No seizures identified  LDL 21 HgbA1c 5.5 VTE prophylaxis - SCD'd    Diet   Diet Carb Modified Fluid consistency: Thin; Room service appropriate? Yes   Eliquis BID  prior to admission, now on aspirin 81 mg daily and Eliquis BID Therapy recommendations:  pending Disposition:  pending   Hypertension Home meds:  none Stable Permissive hypertension (OK if < 220/120) but gradually normalize in 5-7 days Long-term BP goal normotensive  Hyperlipidemia Home meds:  atorvastatin, resumed in hospital LDL 21, goal < 70 Continue statin at discharge   Other Stroke Risk Factors Advanced Age >/= 64  Hx stroke/[name]   Hospital day # 0  Beulah Gandy, NP  STROKE MD NOTE :   I have personally obtained history,examined this patient, reviewed notes, independently viewed imaging studies, participated in medical decision making and plan of care.ROS completed by me personally and pertinent positives fully documented  I have made any additions or clarifications directly to the above note. Agree with note above.  Patient has presented with another embolic right MCA infarct and he has a history of A. fib and is on anticoagulation with Eliquis as well as has endocrine atherosclerosis as well.  Recommend continue Eliquis as there is no definite data suggesting switching to Pradaxa or Xarelto is necessarily superior.  Check EEG for seizure activity.  Aggressive risk factor  modification.  Greater than 50% time during this 35-minute visit was spent in counseling and coordination of care and discussion with care team and answering questions.  Stroke team will  sign off.  Can call for questions  Antony Contras, MD Medical Director [facility] Pager: [phone] 05/11/2021 4:24 PM  To contact Stroke Continuity provider, please refer to [email or web address] After hours, contact General Neurology

## 2021-05-11 NOTE — Progress Notes (Signed)
SLP Cancellation Note  Patient Details Name: Courtney Burke MRN: [id number] DOB: 08/07/1955   Cancelled treatment:       Reason Eval/Treat Not Completed: Patient at procedure or test/unavailable  Unable to complete BSE and SLE at this time, as pt is currently working with PT. Will continue efforts.   Courtney Burke, [facility], [place] Speech Language Pathologist Office: [phone]  Shonna Chock 05/11/2021, 9:07 AM

## 2021-05-12 LAB — CBC
HCT: 31.2 % — ABNORMAL LOW (ref 36.0–46.0)
Hemoglobin: 10.7 g/dL — ABNORMAL LOW (ref 12.0–15.0)
MCH: 28.6 pg (ref 26.0–34.0)
MCHC: 34.3 g/dL (ref 30.0–36.0)
MCV: 83.4 fL (ref 80.0–100.0)
Platelets: 321 10*3/uL (ref 150–400)
RBC: 3.74 MIL/uL — ABNORMAL LOW (ref 3.87–5.11)
RDW: 15.4 % (ref 11.5–15.5)
WBC: 4.7 10*3/uL (ref 4.0–10.5)
nRBC: 0 % (ref 0.0–0.2)

## 2021-05-12 LAB — GLUCOSE, CAPILLARY
Glucose-Capillary: 64 mg/dL — ABNORMAL LOW (ref 70–99)
Glucose-Capillary: 85 mg/dL (ref 70–99)
Glucose-Capillary: 96 mg/dL (ref 70–99)

## 2021-05-12 LAB — BASIC METABOLIC PANEL
Anion gap: 8 (ref 5–15)
BUN: 22 mg/dL (ref 8–23)
CO2: 19 mmol/L — ABNORMAL LOW (ref 22–32)
Calcium: 8.8 mg/dL — ABNORMAL LOW (ref 8.9–10.3)
Chloride: 111 mmol/L (ref 98–111)
Creatinine, Ser: 1.78 mg/dL — ABNORMAL HIGH (ref 0.44–1.00)
GFR, Estimated: 31 mL/min — ABNORMAL LOW (ref >60)
Glucose, Bld: 105 mg/dL — ABNORMAL HIGH (ref 70–99)
Potassium: 4.2 mmol/L (ref 3.5–5.1)
Sodium: 138 mmol/L (ref 135–145)

## 2021-05-12 NOTE — Progress Notes (Signed)
Pt is being wheeled to car for discharge. Pt has all belongings. All IV and eq equipment has been reviewed. AVS has been given and reviewed with family at bedside. Pt sent in good spirits.

## 2021-05-12 NOTE — Evaluation (Signed)
Speech Language Pathology Evaluation Patient Details Name: Czarina Gingras MRN: [id number] DOB: [birth year] Today's Date: 05/12/2021 Time: 0626-9485 SLP Time Calculation (min) (ACUTE ONLY): 25 min  Problem List:  Patient Active Problem List   Diagnosis Date Noted   Acute ischemic right MCA stroke ([place]) 05/11/2021   CVA (cerebral vascular accident) ([place]) 05/11/2021   Acute cerebral infarction ([place]) 05/10/2021   Dyslipidemia    Stage 3b chronic kidney disease ([place])    Dysphagia, post-stroke    Diabetes mellitus type 2 in obese ([place])    Gallstones 08/18/2019   Esophagitis 07/20/2019   Erosive gastritis 07/20/2019   Chronic bilateral thoracic back pain 06/05/2018   Age-related nuclear cataract of both eyes 09/28/2015   Asthma 09/28/2015   Essential hypertension 09/28/2015   Essential thrombocytosis ([place]) 09/28/2015   Gastroesophageal reflux disease without esophagitis 09/28/2015   Keratoconjunctivitis sicca of both eyes not specified as Sjogren's 09/28/2015   Status post gastric bypass for obesity 09/28/2015   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   Morbid obesity ([place]) 05/21/2013   Hyperlipidemia, unspecified 03/16/2013   Past Medical History:  Past Medical History:  Diagnosis Date   Acute ischemic left MCA stroke ([place]) 06/30/2020   Acute ischemic stroke ([facility])    Acute stroke due to occlusion of left middle cerebral artery ([place]) 06/30/2020   Age-related nuclear cataract of both eyes 09/28/2015   Arthralgia of multiple joints 09/28/2015   Asthma    Borderline diabetes    Cerebrovascular accident (CVA) ([place])    Chronic bilateral thoracic back pain 06/05/2018   Chronic renal insufficiency, stage 1 08/11/2014   Diabetes mellitus type 2 in obese ([place])    Dyslipidemia    Dysphagia, post-stroke    Erosive gastritis 07/20/2019   Formatting of this note might be different from the original. On EGD 07/2019   Esophagitis 07/20/2019   Formatting of this note might be different from the  original. Added automatically from request for surgery [id number]  Formatting of this note might be different from the original. On EGD 07/2019   Essential hypertension 09/28/2015   Essential thrombocytosis ([place]) 09/28/2015   Gallstones 08/18/2019   Formatting of this note might be different from the original. 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) 05/21/2013   Stage 3b chronic kidney disease ([place])    Status post gastric bypass for obesity 09/28/2015   Past Surgical History:  Past Surgical History:  Procedure Laterality Date   ANKLE SURGERY     CARPAL TUNNEL RELEASE     carpel tunnel     IR ANGIO INTRA EXTRACRAN SEL COM CAROTID INNOMINATE BILAT MOD SED  01/17/2021   IR ANGIO VERTEBRAL SEL SUBCLAVIAN INNOMINATE UNI R MOD SED  01/17/2021   IR CT HEAD LTD  06/30/2020   IR INTRA CRAN STENT  06/30/2020   IR PERCUTANEOUS ART THROMBECTOMY/INFUSION INTRACRANIAL INC DIAG ANGIO  06/30/2020   IR RADIOLOGIST EVAL & MGMT  08/26/2020   IR US GUIDE VASC ACCESS RIGHT  01/17/2021   RADIOLOGY WITH ANESTHESIA N/A 06/30/2020   Procedure: RADIOLOGY WITH ANESTHESIA;  Surgeon: Radiologist, Medication, MD;  Location: [facility];  Service: Radiology;  Laterality: N/A;   HPI:  65yo female admitted 05/10/21 with headache,  dysarthria, and confusion. PMH: Left MCA CVA (06/2020) s/p stenting, new right temporal parietal infarct, seizures, expressive aphasia, HTN, gastric bypass surgery (2017), CKD3b, DM2, esophagitis, GERD.   Assessment / Plan / Recommendation Clinical Impression  Pt demonstrates a moderate fluent aphasia with difficulty in auditory comprehension and verbal expression. She is highly  perseverative in her expressive language also with frequent paraphasias and jargon but also intermittent recognition of errors and efforts to self correct at times. She is able to comprehend single words and phrases, but not the complete message of a command, if complex. For example if asked to match an object to a picture of the object she can identify the object, but not the task of matching unless significant modeling and structure is involved. Suspect her language function is at baseline, but pt may be more impulsive, labile and less attentive than usual. She needs increased cueing to recognize pictures and objects in her left visual field. These are likely new areas to assess and treat as she returns to OP SLP treatment. Her primary therapist will be able to best identify these cognitive differences. No acute SLP f/u needed    SLP Assessment  SLP Visit Diagnosis: Aphasia (R47.01)    Recommendations for follow up therapy are one component of a multi-disciplinary discharge planning process, led by the attending physician.  Recommendations may be updated based on patient status, additional functional criteria and insurance authorization.    Follow Up Recommendations  Outpatient SLP    Assistance Recommended at Discharge     Functional Status Assessment    Frequency and Duration           SLP Evaluation Cognition  Overall Cognitive Status: Impaired/Different from baseline Arousal/Alertness: Awake/alert Orientation Level: Oriented to person;Oriented to place;Oriented to situation Attention: Focused;Sustained;Selective Focused Attention: Appears intact Sustained Attention: Impaired Sustained Attention Impairment: Verbal complex;Functional complex Selective Attention: Impaired Selective Attention Impairment: Verbal complex;Functional complex Memory: Appears intact Awareness: Impaired Awareness Impairment: Emergent impairment Safety/Judgment: Impaired       Comprehension  Auditory  Comprehension Overall Auditory Comprehension: Impaired Yes/No Questions: Impaired Basic Biographical Questions: 0-25% accurate Commands: Impaired One Step Basic Commands: 0-24% accurate Conversation: Simple Reading Comprehension Reading Status: Within funtional limits    Expression Verbal Expression Overall Verbal Expression: Impaired Initiation: No impairment Automatic Speech: Name;Social Response Level of Generative/Spontaneous Verbalization: Conversation Repetition: Impaired Level of Impairment: Word level Naming: Impairment Responsive: Not tested Confrontation: Impaired Verbal Errors: Perseveration;Neologisms;Not aware of errors;Semantic paraphasias Pragmatics: Impairment Impairments: Abnormal affect;Topic appropriateness;Turn Taking Interfering Components: Attention   Oral / Motor  Oral Motor/Sensory Function Overall Oral Motor/Sensory Function: Within functional limits Motor Speech Overall Motor Speech: Appears within functional limits for tasks assessed   GO                    Zelie Asbill, [name] 05/12/2021, 11:29 AM

## 2021-05-12 NOTE — Discharge Summary (Signed)
Name: Courtney Burke MRN: [id number] DOB: August 23, 1955 65 y.o. PCP: [name], PA-C  Date of Admission: 05/10/2021 11:12 AM Date of Discharge:   05/12/21 Attending Physician: Axel Filler, *  Discharge Diagnosis: 1. Acute temporooccipital cerebral infarction; History of left MCA strokes 2. Atrial Fibrillation 3. HTN 4. Seizures 5. AKI on CKD 6. Anemia 7. T2DM  Discharge Medications: Allergies as of 05/12/2021       Reactions   Iodinated Diagnostic Agents Itching   Doxycycline Nausea And Vomiting   Sulfa Antibiotics Rash, Hives   Codeine Hives, Swelling   Swollen tongue   Hydrocodone Bit-homatrop Mbr Itching   Ace Inhibitors Cough, Itching, Rash        Medication List     STOP taking these medications    potassium chloride SA 20 MEQ tablet Commonly known as: KLOR-CON M   predniSONE 50 MG tablet Commonly known as: DELTASONE       TAKE these medications    atorvastatin 80 MG tablet Commonly known as: LIPITOR Take 1 tablet (80 mg total) by mouth at bedtime.   diphenhydrAMINE 50 MG tablet Commonly known as: BENADRYL Take 1 tablet (50 mg total) by mouth at bedtime as needed for itching. Take Benadryl 50 mg at 7:30 am on 8/9 (with Prednisone 50 mg.)   Eliquis 5 MG Tabs tablet Generic drug: apixaban TAKE 1 TABLET BY MOUTH TWICE A DAY What changed: how much to take   levETIRAcetam 500 MG tablet Commonly known as: Keppra Take 1 tablet (500 mg total) by mouth 2 (two) times daily.   multivitamin with minerals Tabs tablet Take 1 tablet by mouth daily.   omeprazole 40 MG capsule Commonly known as: PRILOSEC Take 40 mg by mouth daily.   ticagrelor 90 MG Tabs tablet Commonly known as: BRILINTA Take 1 tablet (90 mg total) by mouth 2 (two) times daily.   ursodiol 250 MG tablet Commonly known as: ACTIGALL Take 250 mg by mouth 2 (two) times daily.   Vitamin D3 25 MCG tablet Commonly known as: Vitamin D TAKE 1 TABLET (1,000  UNITS TOTAL) BY MOUTH DAILY BEFORE BREAKFAST.        Disposition and follow-up:   Courtney Burke was discharged from [facility] in Stable condition.  At the hospital follow up visit please address:  1. Acute temporooccipital cerebral infarction; History of left MCA strokes- assess how patient is doing with PT/OT. As well as secondary prevention   2. HTN- assess BP in the ambulatory setting and adjust medications as needed  3. AKI- Recheck creatinine  4. Seizures- inquire whether patient has had any further events  2.  Labs / imaging needed at time of follow-up: bmp  3.  Pending labs/ test needing follow-up: none  Follow-up Appointments:   Hospital Course by problem list: 1.  Acute temporooccipital cerebral infarction; History of left MCA strokes Patient has a history of a prior stroke back in January, from which she has some minor residual right sided weakness and significant aphasia. At that time she had stents placed. On admission she came in after worsening of her aphasia as well as confusion and an acute infarct was seen on CT outside of the tpa window. Echo was normal, showed ischemic nonhemorrhagic infarct of temporoccipital junction. Patient did have repeat CT given worsening of her headache out of concern for possible hemorrhagic conversion though this was negative. Patient's headache improved with tylenol. Patient was fortunate enough to not have any significant deficits  after this infarct and remained at baseline. PT OT recommended home health follow up. Will continue with eliquis, atorvastatin, and brilinta upon discharge.   2. Atrial Fibrillation Patient continued on home eliquis, CHADSVASC of 6. In sinus rhythm on admission.   3. AKI on CKD Baseline creatinine of about 1.3-1.4. Creatinine on admission elevated to 1.82, this has improved to 1.43 on 12/8 after fluids. Suspect this will continue to improve one home and able to eat and drink more in  a familiar environment.  4. Seizures EEG shows encephalopathy likely related to the stroke, no seizure activity. Continued home keppra.  5. T2DM A1c 5.5, no interventions required.  6. Anemia  CBC ran 10-11, required no transfusions   Discharge subjective: Patient says that she is feeling much better this morning which she thanks God for. Her head was hurting when she came in but it is doing much better. She will be excited to go home  Discharge Exam:   BP 135/70 (BP Location: Left Arm)   Pulse 68   Temp 98.6 F (37 C) (Oral)   Resp 14   LMP 09/28/2012   SpO2 100%  Constitutional: elderly woman lying comfortably in bed in no acute distress Cardiovascular: regular rate and rhythm, no m/r/g Pulmonary/Chest: normal work of breathing on room air, lungs clear to auscultation bilaterally Abdominal: soft, non-tender, non-distended Neurological: alert and oriented to self and place, 5/5 strength ion the left upper and lower extremities, 4/5 strength on the right. Cranial nerves grossly intact, expressive aphasia Skin: warm and dry Psych: normal affect  Pertinent Labs, Studies, and Procedures:   CT HEAD WO CONTRAST (5MM)  Result Date: 05/11/2021 CLINICAL DATA:  Follow-up examination for acute stroke. EXAM: CT HEAD WITHOUT CONTRAST TECHNIQUE: Contiguous axial images were obtained from the base of the skull through the vertex without intravenous contrast. COMPARISON:  Previous CT and MRI from 05/10/2021. FINDINGS: Brain: Previously identified acute ischemic infarct involving the right temporal occipital junction again seen, relatively stable in size and morphology as compared to previous MRI. No evidence for hemorrhagic transformation or other complication. No significant regional mass effect. No other new or acute large vessel territory infarct. Chronic left MCA distribution infarcts again noted. Small remote right cerebellar infarct noted as well. No acute intracranial hemorrhage. No mass  lesion or new midline shift. No hydrocephalus or extra-axial fluid collection. Vascular: Vascular stent in place within the left M1 segment. No hyperdense vessel. Skull: Scalp soft tissues and calvarium demonstrate no new abnormality. Sinuses/Orbits: Globes and orbital soft tissues demonstrate no acute finding. Paranasal sinuses mastoid air cells remain clear. Other: None. IMPRESSION: 1. No significant interval change in size and morphology of acute ischemic infarct involving the right temporoccipital junction. No evidence for hemorrhagic transformation or other complication. 2. No other new acute intracranial abnormality. Electronically Signed   By: Jeannine Boga M.D.   On: 05/11/2021 02:56   CT HEAD WO CONTRAST  Result Date: 05/10/2021 CLINICAL DATA:  Neurological deficit, acute, stroke suspected. Aphasia and confusion. EXAM: CT HEAD WITHOUT CONTRAST TECHNIQUE: Contiguous axial images were obtained from the base of the skull through the vertex without intravenous contrast. COMPARISON:  02/07/2021 FINDINGS: Brain: Previous left middle cerebral artery stent with old infarction within the left middle cerebral artery territory as seen previously. There is newly seen loss of gray-white differentiation at the right temporoparietal junction. There could be acute infarction in this location. No sign of hemorrhage or mass effect. No hydrocephalus or extra-axial fluid collection. Vascular: Left MCA  stent as noted above. Skull: Negative Sinuses/Orbits: Clear/normal Other: None IMPRESSION: Old left MCA territory strokes. Newly seen loss of gray-white differentiation at the right temporoparietal junction suggesting acute infarction in that region today. No hemorrhage or mass effect. Electronically Signed   By: Nelson Chimes M.D.   On: 05/10/2021 12:43   MR ANGIO HEAD WO CONTRAST  Result Date: 05/10/2021 CLINICAL DATA:  Initial evaluation for neuro deficit, stroke suspected. Abnormal CT. EXAM: MRI HEAD WITHOUT  CONTRAST MRA HEAD WITHOUT CONTRAST MRA NECK WITHOUT CONTRAST TECHNIQUE: Multiplanar, multiecho pulse sequences of the brain and surrounding structures were obtained without intravenous contrast. Angiographic images of the Circle of Willis were obtained using MRA technique without intravenous contrast. Angiographic images of the neck were obtained using MRA technique without intravenous contrast. Carotid stenosis measurements (when applicable) are obtained utilizing NASCET criteria, using the distal internal carotid diameter as the denominator. COMPARISON:  Prior head CT from earlier the same day as well as earlier studies. FINDINGS: MRI HEAD FINDINGS Brain: Cerebral volume within normal limits for age. Chronic encephalomalacia and gliosis involving the left cerebral hemisphere consistent with chronic left MCA distribution infarcts. Scattered areas of chronic hemosiderin staining and/or laminar necrosis noted throughout these areas. Small remote right cerebellar infarct noted. Underlying mild chronic small vessel ischemic disease. Abnormal restricted diffusion involving the right temporal occipital junction consistent with an acute ischemic infarct, posterior right MCA distribution (series 5, image 72). Finding corresponds with abnormality on prior CT. No associated hemorrhage or mass effect. Few additional scattered small volume foci of diffusion abnormality involving the left frontal lobe (series 5, image 84) and left periatrial region (series 5, image 73) favored to be artifactual nature related underlying laminar necrosis and/or chronic blood products. Otherwise, gray-white matter differentiation maintained. No other areas of acute infarction. No acute intracranial hemorrhage. 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 No other mass lesion. Mild right left deviation related to chronic left cerebral volume loss. No hydrocephalus or extra-axial fluid collection. Pituitary gland and suprasellar region normal. Midline  structures intact. Vascular: Major intracranial vascular flow voids maintained. Vascular stent in place within the left M1 segment. Skull and upper cervical spine: Craniocervical junction within normal limits. Bone marrow signal intensity diffusely decreased on T1 weighted sequence, nonspecific, but most commonly related to anemia, smoking or obesity. No focal marrow replacing lesion. No scalp soft tissue abnormality. Sinuses/Orbits: Globes and orbital soft tissues within normal limits. Paranasal sinuses are clear. No mastoid effusion. Other: None. MRA HEAD FINDINGS ANTERIOR CIRCULATION: Visualized distal cervical segments of the internal carotid arteries are patent with antegrade flow. Petrous segments patent bilaterally. Cavernous and supraclinoid left ICA widely patent. Short-segment approximate 50% stenosis at the supraclinoid right ICA, grossly similar to prior arteriogram (series 1027, image 10). Right M1 segment remains patent. Normal right MCA bifurcation. Probable severe proximal right M2 stenosis with associated flow gap (series 1033, image 15). This is similar as compared to prior MRA. Right MCA branches remain perfused distally, although demonstrates small vessel atheromatous irregularity. Vascular stent in place within the left M1 segment. Flow through the stent itself not evaluated by MRA. Probable short-segment severe left M2 stenosis, seen on prior MRA (series 5, image 113). Left MCA branches otherwise patent distally. A1 segments patent bilaterally. Normal anterior communicating artery complex. Anterior cerebral arteries remain widely patent. POSTERIOR CIRCULATION: Both vertebral arteries remain widely patent to the vertebrobasilar junction. Right PICA origin patent. Left PICA not seen. Basilar patent to its distal aspect without stenosis. Superior cerebellar arteries patent  bilaterally. Both PCA supplied via hypoplastic P1 segments and robust bilateral posterior communicating arteries. PCAs remain  widely patent to their distal aspects. No aneurysm. MRA NECK FINDINGS AORTIC ARCH: Visualized aortic arch normal caliber. Bovine branching pattern noted at the aortic arch. No hemodynamically significant stenosis about the origin of the great vessels. RIGHT CAROTID SYSTEM: Right common and internal carotid arteries patent without stenosis or evidence for dissection. Mild for age atheromatous irregularity about the right bifurcation without stenosis. LEFT CAROTID SYSTEM: Left common and internal carotid arteries patent without stenosis or evidence for dissection. Mild for age atheromatous irregularity about the left bifurcation without stenosis. VERTEBRAL ARTERIES: Both vertebral arteries arise from subclavian arteries. Vertebral arteries are largely codominant and widely patent without stenosis or evidence for dissection. IMPRESSION: MRI HEAD: 1. Acute ischemic nonhemorrhagic infarct involving the right temporoccipital junction, posterior right MCA distribution. 2. Chronic left MCA territory infarcts, stable from prior. 3. Additional small remote right cerebellar infarct. 4. Underlying age-related cerebral atrophy with mild chronic small vessel ischemic disease. MRA HEAD: 1. Negative intracranial MRA for large vessel occlusion. 2. Approximate 50% stenosis at the supraclinoid right ICA, with probable additional short-segment severe proximal right M2 stenosis. 3. Vascular stent in place within the left M1 segment. Flow through the stent itself not evaluated by MRA, although patent flow seen distal to the stent. 4. Short-segment severe left M2 stenosis, similar to previous. 5. Wide patency of the posterior circulation. MRA NECK: Wide patency of both carotid artery systems and vertebral arteries within the neck. No hemodynamically significant stenosis or other acute vascular abnormality. Electronically Signed   By: Jeannine Boga M.D.   On: 05/10/2021 19:03   MR ANGIO NECK WO CONTRAST  Result Date:  05/10/2021 CLINICAL DATA:  Initial evaluation for neuro deficit, stroke suspected. Abnormal CT. EXAM: MRI HEAD WITHOUT CONTRAST MRA HEAD WITHOUT CONTRAST MRA NECK WITHOUT CONTRAST TECHNIQUE: Multiplanar, multiecho pulse sequences of the brain and surrounding structures were obtained without intravenous contrast. Angiographic images of the Circle of Willis were obtained using MRA technique without intravenous contrast. Angiographic images of the neck were obtained using MRA technique without intravenous contrast. Carotid stenosis measurements (when applicable) are obtained utilizing NASCET criteria, using the distal internal carotid diameter as the denominator. COMPARISON:  Prior head CT from earlier the same day as well as earlier studies. FINDINGS: MRI HEAD FINDINGS Brain: Cerebral volume within normal limits for age. Chronic encephalomalacia and gliosis involving the left cerebral hemisphere consistent with chronic left MCA distribution infarcts. Scattered areas of chronic hemosiderin staining and/or laminar necrosis noted throughout these areas. Small remote right cerebellar infarct noted. Underlying mild chronic small vessel ischemic disease. Abnormal restricted diffusion involving the right temporal occipital junction consistent with an acute ischemic infarct, posterior right MCA distribution (series 5, image 72). Finding corresponds with abnormality on prior CT. No associated hemorrhage or mass effect. Few additional scattered small volume foci of diffusion abnormality involving the left frontal lobe (series 5, image 84) and left periatrial region (series 5, image 73) favored to be artifactual nature related underlying laminar necrosis and/or chronic blood products. Otherwise, gray-white matter differentiation maintained. No other areas of acute infarction. No acute intracranial hemorrhage. 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 No other mass lesion. Mild right left deviation related to chronic left  cerebral volume loss. No hydrocephalus or extra-axial fluid collection. Pituitary gland and suprasellar region normal. Midline structures intact. Vascular: Major intracranial vascular flow voids maintained. Vascular stent in place within the left M1 segment. Skull and  upper cervical spine: Craniocervical junction within normal limits. Bone marrow signal intensity diffusely decreased on T1 weighted sequence, nonspecific, but most commonly related to anemia, smoking or obesity. No focal marrow replacing lesion. No scalp soft tissue abnormality. Sinuses/Orbits: Globes and orbital soft tissues within normal limits. Paranasal sinuses are clear. No mastoid effusion. Other: None. MRA HEAD FINDINGS ANTERIOR CIRCULATION: Visualized distal cervical segments of the internal carotid arteries are patent with antegrade flow. Petrous segments patent bilaterally. Cavernous and supraclinoid left ICA widely patent. Short-segment approximate 50% stenosis at the supraclinoid right ICA, grossly similar to prior arteriogram (series 1027, image 10). Right M1 segment remains patent. Normal right MCA bifurcation. Probable severe proximal right M2 stenosis with associated flow gap (series 1033, image 15). This is similar as compared to prior MRA. Right MCA branches remain perfused distally, although demonstrates small vessel atheromatous irregularity. Vascular stent in place within the left M1 segment. Flow through the stent itself not evaluated by MRA. Probable short-segment severe left M2 stenosis, seen on prior MRA (series 5, image 113). Left MCA branches otherwise patent distally. A1 segments patent bilaterally. Normal anterior communicating artery complex. Anterior cerebral arteries remain widely patent. POSTERIOR CIRCULATION: Both vertebral arteries remain widely patent to the vertebrobasilar junction. Right PICA origin patent. Left PICA not seen. Basilar patent to its distal aspect without stenosis. Superior cerebellar arteries patent  bilaterally. Both PCA supplied via hypoplastic P1 segments and robust bilateral posterior communicating arteries. PCAs remain widely patent to their distal aspects. No aneurysm. MRA NECK FINDINGS AORTIC ARCH: Visualized aortic arch normal caliber. Bovine branching pattern noted at the aortic arch. No hemodynamically significant stenosis about the origin of the great vessels. RIGHT CAROTID SYSTEM: Right common and internal carotid arteries patent without stenosis or evidence for dissection. Mild for age atheromatous irregularity about the right bifurcation without stenosis. LEFT CAROTID SYSTEM: Left common and internal carotid arteries patent without stenosis or evidence for dissection. Mild for age atheromatous irregularity about the left bifurcation without stenosis. VERTEBRAL ARTERIES: Both vertebral arteries arise from subclavian arteries. Vertebral arteries are largely codominant and widely patent without stenosis or evidence for dissection. IMPRESSION: MRI HEAD: 1. Acute ischemic nonhemorrhagic infarct involving the right temporoccipital junction, posterior right MCA distribution. 2. Chronic left MCA territory infarcts, stable from prior. 3. Additional small remote right cerebellar infarct. 4. Underlying age-related cerebral atrophy with mild chronic small vessel ischemic disease. MRA HEAD: 1. Negative intracranial MRA for large vessel occlusion. 2. Approximate 50% stenosis at the supraclinoid right ICA, with probable additional short-segment severe proximal right M2 stenosis. 3. Vascular stent in place within the left M1 segment. Flow through the stent itself not evaluated by MRA, although patent flow seen distal to the stent. 4. Short-segment severe left M2 stenosis, similar to previous. 5. Wide patency of the posterior circulation. MRA NECK: Wide patency of both carotid artery systems and vertebral arteries within the neck. No hemodynamically significant stenosis or other acute vascular abnormality.  Electronically Signed   By: Jeannine Boga M.D.   On: 05/10/2021 19:03   MR BRAIN WO CONTRAST  Result Date: 05/10/2021 CLINICAL DATA:  Initial evaluation for neuro deficit, stroke suspected. Abnormal CT. EXAM: MRI HEAD WITHOUT CONTRAST MRA HEAD WITHOUT CONTRAST MRA NECK WITHOUT CONTRAST TECHNIQUE: Multiplanar, multiecho pulse sequences of the brain and surrounding structures were obtained without intravenous contrast. Angiographic images of the Circle of Willis were obtained using MRA technique without intravenous contrast. Angiographic images of the neck were obtained using MRA technique without intravenous contrast. Carotid stenosis measurements (  when applicable) are obtained utilizing NASCET criteria, using the distal internal carotid diameter as the denominator. COMPARISON:  Prior head CT from earlier the same day as well as earlier studies. FINDINGS: MRI HEAD FINDINGS Brain: Cerebral volume within normal limits for age. Chronic encephalomalacia and gliosis involving the left cerebral hemisphere consistent with chronic left MCA distribution infarcts. Scattered areas of chronic hemosiderin staining and/or laminar necrosis noted throughout these areas. Small remote right cerebellar infarct noted. Underlying mild chronic small vessel ischemic disease. Abnormal restricted diffusion involving the right temporal occipital junction consistent with an acute ischemic infarct, posterior right MCA distribution (series 5, image 72). Finding corresponds with abnormality on prior CT. No associated hemorrhage or mass effect. Few additional scattered small volume foci of diffusion abnormality involving the left frontal lobe (series 5, image 84) and left periatrial region (series 5, image 73) favored to be artifactual nature related underlying laminar necrosis and/or chronic blood products. Otherwise, gray-white matter differentiation maintained. No other areas of acute infarction. No acute intracranial hemorrhage. 12  mm benign appearing pineal cyst noted. No other mass lesion. Mild right left deviation related to chronic left cerebral volume loss. No hydrocephalus or extra-axial fluid collection. Pituitary gland and suprasellar region normal. Midline structures intact. Vascular: Major intracranial vascular flow voids maintained. Vascular stent in place within the left M1 segment. Skull and upper cervical spine: Craniocervical junction within normal limits. Bone marrow signal intensity diffusely decreased on T1 weighted sequence, nonspecific, but most commonly related to anemia, smoking or obesity. No focal marrow replacing lesion. No scalp soft tissue abnormality. Sinuses/Orbits: Globes and orbital soft tissues within normal limits. Paranasal sinuses are clear. No mastoid effusion. Other: None. MRA HEAD FINDINGS ANTERIOR CIRCULATION: Visualized distal cervical segments of the internal carotid arteries are patent with antegrade flow. Petrous segments patent bilaterally. Cavernous and supraclinoid left ICA widely patent. Short-segment approximate 50% stenosis at the supraclinoid right ICA, grossly similar to prior arteriogram (series 1027, image 10). Right M1 segment remains patent. Normal right MCA bifurcation. Probable severe proximal right M2 stenosis with associated flow gap (series 1033, image 15). This is similar as compared to prior MRA. Right MCA branches remain perfused distally, although demonstrates small vessel atheromatous irregularity. Vascular stent in place within the left M1 segment. Flow through the stent itself not evaluated by MRA. Probable short-segment severe left M2 stenosis, seen on prior MRA (series 5, image 113). Left MCA branches otherwise patent distally. A1 segments patent bilaterally. Normal anterior communicating artery complex. Anterior cerebral arteries remain widely patent. POSTERIOR CIRCULATION: Both vertebral arteries remain widely patent to the vertebrobasilar junction. Right PICA origin  patent. Left PICA not seen. Basilar patent to its distal aspect without stenosis. Superior cerebellar arteries patent bilaterally. Both PCA supplied via hypoplastic P1 segments and robust bilateral posterior communicating arteries. PCAs remain widely patent to their distal aspects. No aneurysm. MRA NECK FINDINGS AORTIC ARCH: Visualized aortic arch normal caliber. Bovine branching pattern noted at the aortic arch. No hemodynamically significant stenosis about the origin of the great vessels. RIGHT CAROTID SYSTEM: Right common and internal carotid arteries patent without stenosis or evidence for dissection. Mild for age atheromatous irregularity about the right bifurcation without stenosis. LEFT CAROTID SYSTEM: Left common and internal carotid arteries patent without stenosis or evidence for dissection. Mild for age atheromatous irregularity about the left bifurcation without stenosis. VERTEBRAL ARTERIES: Both vertebral arteries arise from subclavian arteries. Vertebral arteries are largely codominant and widely patent without stenosis or evidence for dissection. IMPRESSION: MRI HEAD: 1. Acute ischemic  nonhemorrhagic infarct involving the right temporoccipital junction, posterior right MCA distribution. 2. Chronic left MCA territory infarcts, stable from prior. 3. Additional small remote right cerebellar infarct. 4. Underlying age-related cerebral atrophy with mild chronic small vessel ischemic disease. MRA HEAD: 1. Negative intracranial MRA for large vessel occlusion. 2. Approximate 50% stenosis at the supraclinoid right ICA, with probable additional short-segment severe proximal right M2 stenosis. 3. Vascular stent in place within the left M1 segment. Flow through the stent itself not evaluated by MRA, although patent flow seen distal to the stent. 4. Short-segment severe left M2 stenosis, similar to previous. 5. Wide patency of the posterior circulation. MRA NECK: Wide patency of both carotid artery systems and  vertebral arteries within the neck. No hemodynamically significant stenosis or other acute vascular abnormality. Electronically Signed   By: Jeannine Boga M.D.   On: 05/10/2021 19:03   EEG adult  Result Date: 05/11/2021 Lora Havens, MD     05/11/2021  1:21 PM Patient Name: [place] Droll MRN: [id number] Epilepsy Attending: Lora Havens Referring Physician/Provider: Dr Lalla Brothers Date: 05/11/2021 Duration: 25.37 mins Patient history: 65 year old female with history of seizures and temporoparietal CVA.  EEG to evaluate for seizure. Level of alertness: Awake, asleep AEDs during EEG study: Keppra Technical aspects: This EEG study was done with scalp electrodes positioned according to the 10-20 International system of electrode placement. Electrical activity was acquired at a sampling rate of 500Hz  and reviewed with a high frequency filter of 70Hz  and a low frequency filter of 1Hz . EEG data were recorded continuously and digitally stored. Description: The posterior dominant rhythm consists of 8-9Hz  activity of moderate voltage (25-35 uV) seen predominantly in posterior head regions, symmetric and reactive to eye opening and eye closing. Sleep was characterized by vertex waves, sleep spindles (12 to 14 Hz), maximal frontocentral region.  EEG showed intermittent generalized  3 to 6 Hz theta-delta slowing. Hyperventilation and photic stimulation were not performed.   ABNORMALITY - Intermittent slow, generalized IMPRESSION: This study is suggestive of mild diffuse encephalopathy, nonspecific etiology. No seizures or epileptiform discharges were seen throughout the recording. Lora Havens   ECHOCARDIOGRAM COMPLETE  Result Date: 05/11/2021    ECHOCARDIOGRAM REPORT   Patient Name:   [facility] Date of Exam: 05/11/2021 Medical Rec #:  [id number]            Height:       62.0 in Accession #:    [id number]           Weight:       157.0 lb Date of Birth:  1956-04-09            BSA:           1.725 m Patient Age:    39 years             BP:           123/68 mmHg Patient Gender: F                    HR:           62 bpm. Exam Location:  Inpatient Procedure: 2D Echo, 3D Echo, Cardiac Doppler and Color Doppler Indications:    Stroke  History:        Patient has no prior history of Echocardiogram examinations.                 Signs/Symptoms:Altered Mental Status; Risk Factors:Hypertension,  Diabetes and Dyslipidemia.  Sonographer:    Roseanna Rainbow RDCS Referring Phys: [id number] [facility] A GRAY  Sonographer Comments: Patient could not follow directions. IMPRESSIONS  1. Left ventricular ejection fraction, by estimation, is 55 to 60%. Left ventricular ejection fraction by 3D volume is 58 %. The left ventricle has normal function. The left ventricle has no regional wall motion abnormalities. There is moderate asymmetric left ventricular hypertrophy of the basal-septal segment. Left ventricular diastolic parameters are consistent with Grade I diastolic dysfunction (impaired relaxation).  2. Right ventricular systolic function is normal. The right ventricular size is normal. There is mildly elevated pulmonary artery systolic pressure. The estimated right ventricular systolic pressure is 99.8 mmHg.  3. The mitral valve is degenerative. Trivial mitral valve regurgitation. No evidence of mitral stenosis.  4. Tricuspid valve regurgitation is mild to moderate.  5. The aortic valve is tricuspid. There is mild calcification of the aortic valve. There is mild thickening of the aortic valve. Aortic valve regurgitation is not visualized. Aortic valve sclerosis/calcification is present, without any evidence of aortic stenosis.  6. The inferior vena cava is normal in size with <50% respiratory variability, suggesting right atrial pressure of 8 mmHg. Comparison(s): No prior Echocardiogram. Conclusion(s)/Recommendation(s): No intracardiac source of embolism detected on this transthoracic study. Consider a  transesophageal echocardiogram to exclude cardiac source of embolism if clinically indicated. FINDINGS  Left Ventricle: Left ventricular ejection fraction, by estimation, is 55 to 60%. Left ventricular ejection fraction by 3D volume is 58 %. The left ventricle has normal function. The left ventricle has no regional wall motion abnormalities. The left ventricular internal cavity size was normal in size. There is moderate asymmetric left ventricular hypertrophy of the basal-septal segment. Left ventricular diastolic parameters are consistent with Grade I diastolic dysfunction (impaired relaxation). Right Ventricle: The right ventricular size is normal. No increase in right ventricular wall thickness. Right ventricular systolic function is normal. There is mildly elevated pulmonary artery systolic pressure. The tricuspid regurgitant velocity is 2.65  m/s, and with an assumed right atrial pressure of 8 mmHg, the estimated right ventricular systolic pressure is 33.8 mmHg. Left Atrium: Left atrial size was normal in size. Right Atrium: Right atrial size was normal in size. Pericardium: There is no evidence of pericardial effusion. Mitral Valve: The mitral valve is degenerative in appearance. There is mild thickening of the mitral valve leaflet(s). There is mild calcification of the mitral valve leaflet(s). Mild mitral annular calcification. Trivial mitral valve regurgitation. No evidence of mitral valve stenosis. Tricuspid Valve: The tricuspid valve is normal in structure. Tricuspid valve regurgitation is mild to moderate. Aortic Valve: The aortic valve is tricuspid. There is mild calcification of the aortic valve. There is mild thickening of the aortic valve. Aortic valve regurgitation is not visualized. Aortic valve sclerosis/calcification is present, without any evidence of aortic stenosis. Pulmonic Valve: The pulmonic valve was normal in structure. Pulmonic valve regurgitation is trivial. Aorta: The aortic root and  ascending aorta are structurally normal, with no evidence of dilitation. Venous: The inferior vena cava is normal in size with less than 50% respiratory variability, suggesting right atrial pressure of 8 mmHg. IAS/Shunts: The interatrial septum is aneurysmal. No atrial level shunt detected by color flow Doppler.  LEFT VENTRICLE PLAX 2D LVIDd:         3.90 cm         Diastology LVIDs:         2.50 cm         LV e' medial:  7.18 cm/s LV PW:         1.00 cm         LV E/e' medial:  11.9 LV IVS:        1.00 cm         LV e' lateral:   14.10 cm/s LVOT diam:     2.00 cm         LV E/e' lateral: 6.1 LV SV:         84 LV SV Index:   49 LVOT Area:     3.14 cm        3D Volume EF                                LV 3D EF:    Left                                             ventricul LV Volumes (MOD)                            ar LV vol d, MOD    73.8 ml                    ejection A2C:                                        fraction LV vol d, MOD    61.8 ml                    by 3D A4C:                                        volume is LV vol s, MOD    31.3 ml                    58 %. A2C: LV vol s, MOD    25.1 ml A4C:                           3D Volume EF: LV SV MOD A2C:   42.5 ml       3D EF:        58 % LV SV MOD A4C:   61.8 ml       LV EDV:       113 ml LV SV MOD BP:    41.0 ml       LV ESV:       48 ml                                LV SV:        65 ml RIGHT VENTRICLE            IVC RV S prime:     8.81 cm/s  IVC diam: 1.90 cm TAPSE (M-mode): 1.7 cm LEFT ATRIUM             Index  RIGHT ATRIUM           Index LA diam:        3.60 cm 2.09 cm/m   RA Area:     13.80 cm LA Vol (A2C):   36.5 ml 21.16 ml/m  RA Volume:   35.00 ml  20.29 ml/m LA Vol (A4C):   24.2 ml 14.03 ml/m LA Biplane Vol: 30.6 ml 17.74 ml/m  AORTIC VALVE LVOT Vmax:   124.00 cm/s LVOT Vmean:  78.600 cm/s LVOT VTI:    0.267 m  AORTA Ao Root diam: 2.70 cm Ao Asc diam:  3.15 cm MITRAL VALVE                TRICUSPID VALVE MV Area (PHT): 2.95 cm      TR Peak grad:   28.1 mmHg MV Decel Time: 257 msec     TR Vmax:        265.00 cm/s MV E velocity: 85.40 cm/s MV A velocity: 115.00 cm/s  SHUNTS MV E/A ratio:  0.74         Systemic VTI:  0.27 m                             Systemic Diam: 2.00 cm Gwyndolyn Kaufman MD Electronically signed by Gwyndolyn Kaufman MD Signature Date/Time: 05/11/2021/2:44:05 PM    Final     CBC Latest Ref Rng & Units 05/12/2021 05/11/2021 05/10/2021  WBC 4.0 - 10.5 K/uL 4.7 3.8(L) -  Hemoglobin 12.0 - 15.0 g/dL 10.7(L) 10.2(L) 11.6(L)  Hematocrit 36.0 - 46.0 % 31.2(L) 29.4(L) 34.0(L)  Platelets 150 - 400 K/uL 321 316 -    BMP Latest Ref Rng & Units 05/12/2021 05/11/2021 05/10/2021  Glucose 70 - 99 mg/dL 105(H) 82 80  BUN 8 - 23 mg/dL 22 16 26(H)  Creatinine 0.44 - 1.00 mg/dL 1.78(H) 1.43(H) 2.00(H)  Sodium 135 - 145 mmol/L 138 136 138  Potassium 3.5 - 5.1 mmol/L 4.2 4.4 5.2(H)  Chloride 98 - 111 mmol/L 111 114(H) 114(H)  CO2 22 - 32 mmol/L 19(L) 16(L) -  Calcium 8.9 - 10.3 mg/dL 8.8(L) 9.0 -    Discharge Instructions: Discharge Instructions     Ambulatory referral to Occupational Therapy   Complete by: As directed    Ambulatory referral to Physical Therapy   Complete by: As directed    Diet - low sodium heart healthy   Complete by: As directed    Increase activity slowly   Complete by: As directed        Signed: [name], MD 05/12/2021, 11:57 AM   Pager: [phone]

## 2021-05-12 NOTE — Discharge Instructions (Addendum)
Courtney Burke  You were recently admitted to [facility] for a stroke. We looked at your heart and checked some blood work which all looked normal. Thankfully you do not have any new weakness or confusion as a result of this stroke. Continue taking your medications as prescribed to help try and prevent any future strokes.   Continue taking your home medications with the following changes  Start taking tylenol 650 mg every 4-6 hours as needed for headaches.  2. Stop taking potassium 3. Continue taking your brillinta and your eliquis. These medications are important to try and prevent future strokes   You should seek further medical care if you new one sided weakness, difficulty speaking, lightheadedness or dizziness, or severe headaches.  We recommend that you see your primary care doctor in about a week to make sure that you continue to improve. We are so glad that you are feeling better.  Sincerely, [name], MD

## 2021-05-12 NOTE — Evaluation (Signed)
Clinical/Bedside Swallow Evaluation Patient Details  Name: Courtney Burke MRN: [id number] Date of Birth: 12-Mar-1956  Today's Date: 05/12/2021 Time: SLP Start Time (ACUTE ONLY): 2683 SLP Stop Time (ACUTE ONLY): 0920 SLP Time Calculation (min) (ACUTE ONLY): 25 min  Past Medical History:  Past Medical History:  Diagnosis Date   Acute ischemic left MCA stroke ([place]) 06/30/2020   Acute ischemic stroke ([place])    Acute stroke due to occlusion of left middle cerebral artery ([place]) 06/30/2020   Age-related nuclear cataract of both eyes 09/28/2015   Arthralgia of multiple joints 09/28/2015   Asthma    Borderline diabetes    Cerebrovascular accident (CVA) ([place])    Chronic bilateral thoracic back pain 06/05/2018   Chronic renal insufficiency, stage 1 08/11/2014   Diabetes mellitus type 2 in obese ([place])    Dyslipidemia    Dysphagia, post-stroke    Erosive gastritis 07/20/2019   Formatting of this note might be different from the original. On EGD 07/2019   Esophagitis 07/20/2019   Formatting of this note might be different from the original. Added automatically from request for surgery [id number]  Formatting of this note might be different from the original. On EGD 07/2019   Essential hypertension 09/28/2015   Essential thrombocytosis ([place]) 09/28/2015   Gallstones 08/18/2019   Formatting of this note might be different from the original. 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   Morbid obesity ([place]) 05/21/2013   Stage 3b chronic kidney disease ([place])    Status post gastric bypass for obesity 09/28/2015   Past Surgical History:  Past Surgical History:  Procedure Laterality Date   ANKLE SURGERY     CARPAL  TUNNEL RELEASE     carpel tunnel     IR ANGIO INTRA EXTRACRAN SEL COM CAROTID INNOMINATE BILAT MOD SED  01/17/2021   IR ANGIO VERTEBRAL SEL SUBCLAVIAN INNOMINATE UNI R MOD SED  01/17/2021   IR CT HEAD LTD  06/30/2020   IR INTRA CRAN STENT  06/30/2020   IR PERCUTANEOUS ART THROMBECTOMY/INFUSION INTRACRANIAL INC DIAG ANGIO  06/30/2020   IR RADIOLOGIST EVAL & MGMT  08/26/2020   IR US GUIDE VASC ACCESS RIGHT  01/17/2021   RADIOLOGY WITH ANESTHESIA N/A 06/30/2020   Procedure: RADIOLOGY WITH ANESTHESIA;  Surgeon: Radiologist, Medication, MD;  Location: [place];  Service: Radiology;  Laterality: N/A;   HPI:  65yo female admitted 05/10/21 with headache, dysarthria, and confusion. PMH: Left MCA CVA (06/2020) s/p stenting, new right temporal parietal infarct, seizures, expressive aphasia, HTN, gastric bypass surgery (2017), CKD3b, DM2, esophagitis, GERD.    Assessment / Plan / Recommendation  Clinical Impression  Pt demonstrates normal swallow function. Able to self feed. No signs of aspiration or dysphagia. Recommend pt continue current diet - regular thin. Will sign off. SLP Visit Diagnosis: Dysphagia, unspecified (R13.10)    Aspiration Risk  Mild aspiration risk    Diet Recommendation Regular;Thin liquid   Liquid Administration via: Straw;Cup Medication Administration: Whole meds with liquid Supervision: Patient able to self feed Postural Changes: Seated upright at 90 degrees    Other  Recommendations      Recommendations for follow up therapy are one component of a multi-disciplinary discharge planning process, led by the attending physician.  Recommendations  may be updated based on patient status, additional functional criteria and insurance authorization.  Follow up Recommendations No SLP follow up      Assistance Recommended at Discharge    Functional Status Assessment    Frequency and Duration            Prognosis        Swallow Study   General HPI: 65yo female admitted 05/10/21  with headache, dysarthria, and confusion. PMH: Left MCA CVA (06/2020) s/p stenting, new right temporal parietal infarct, seizures, expressive aphasia, HTN, gastric bypass surgery (2017), CKD3b, DM2, esophagitis, GERD. Type of Study: Bedside Swallow Evaluation Previous Swallow Assessment: BSE on CIR 07/10/20 - Dys2/NTL, adv to Dys3/thin. Diet Prior to this Study: Regular;Thin liquids Temperature Spikes Noted: No Respiratory Status: Room air History of Recent Intubation: No Behavior/Cognition: Alert;Cooperative;Pleasant mood Oral Cavity Assessment: Within Functional Limits Oral Care Completed by SLP: No Oral Cavity - Dentition: Adequate natural dentition Vision: Functional for self-feeding Self-Feeding Abilities: Able to feed self Patient Positioning: Upright in bed Baseline Vocal Quality: Normal Volitional Cough: Strong Volitional Swallow: Able to elicit    Oral/Motor/Sensory Function Overall Oral Motor/Sensory Function: Within functional limits   Ice Chips     Thin Liquid Thin Liquid: Within functional limits    Nectar Thick Nectar Thick Liquid: Not tested   Honey Thick Honey Thick Liquid: Not tested   Puree Puree: Not tested   Solid     Solid: Within functional limits      Courtney Burke, [name] 05/12/2021,10:45 AM

## 2021-05-12 NOTE — TOC Transition Note (Signed)
Transition of Care [facility]) - CM/SW Discharge Note   Patient Details  Name: Courtney Burke MRN: [id number] Date of Birth: [birth year]  [facility] CM/SW Contact:  Pollie Friar, RN Phone Number: 05/12/2021, 1:30 PM   Clinical Narrative:    Pt with aphasia. CM called and spoke to pts daughter over the phone.  Patient is discharging home with outpatient therapy through [facility] in [facility]. Information faxed to [facility]: [phone]. They will contact the family for the first appointment.  No new DME needs.  Daughter denies issues with transportation or home medications. Daughter states pt has supervision at home.  Pt has transport home.    Final next level of care: OP Rehab Barriers to Discharge: No Barriers Identified   Patient Goals and CMS Choice   CMS Medicare.gov Compare Post Acute Care list provided to:: Patient Represenative (must comment) Choice offered to / list presented to : Adult Children  Discharge Placement                       Discharge Plan and Services                                     Social Determinants of Health (SDOH) Interventions     Readmission Risk Interventions No flowsheet data found.

## 2021-05-12 NOTE — Progress Notes (Signed)
Physical Therapy Treatment Patient Details Name: Courtney Burke MRN: [id number] DOB: 07-Mar-1956 Today's Date: 05/12/2021   History of Present Illness 65 y.o. F who presented to the ED 05/10/21 with complaints of a headache and worsening slurred speech and confusion.  CT head old left MCA infarct however new right temporal parietal infarct.   PMH of a left MCA stroke s/p stenting, seizures on keppra, expressive aphasia, HTN, gastric bypass surgery, CKD3B,T2DM    PT Comments    Pt received in supine, aphasic but participatory with good command following. Pt with decreased R awareness and cognitive deficit, unable to distinguish left vs right when wayfinding in hallway and poor recall of room location but needing only up to minA for stair sequencing without UE support. Pt remains an increased fall risk due to decreased insight into deficits and mild unsteadiness, therefore recommend OPPT and constant supervision/assist upon discharge.  Recommendations for follow up therapy are one component of a multi-disciplinary discharge planning process, led by the attending physician.  Recommendations may be updated based on patient status, additional functional criteria and insurance authorization.  Follow Up Recommendations  Outpatient PT     Assistance Recommended at Discharge Frequent or constant Supervision/Assistance  Equipment Recommendations  None recommended by PT    Recommendations for Other Services       Precautions / Restrictions Precautions Precautions: Other (comment) Precaution Comments: decr expressive and receptive language Restrictions Weight Bearing Restrictions: No     Mobility  Bed Mobility Overal bed mobility: Modified Independent      General bed mobility comments: no use of rail    Transfers Overall transfer level: Needs assistance Equipment used: None Transfers: Sit to/from Stand Sit to Stand: Min guard      General transfer comment: Min guard for  safety due to impusivity and mild balance deficits.    Ambulation/Gait Ambulation/Gait assistance: Supervision;Min guard Gait Distance (Feet): 250 Feet Assistive device: None Gait Pattern/deviations: Step-to pattern;Decreased step length - right;Decreased stance time - right;Decreased weight shift to right Gait velocity: no overt LOB with gait on flat surfaces but min guard for safety due to pt impulsivity and cognitive deficit Gait velocity interpretation: <1.31 ft/sec, indicative of household ambulator   General Gait Details: Gait almost appears antalgic with pt denying pain; left LE in slight external rotation; decr step length and weight shift over RLE   Stairs Stairs: Yes Stairs assistance: Min assist;Min guard Stair Management: No rails;Two rails;Step to pattern;Alternating pattern;Forwards Number of Stairs: 15 General stair comments: pt needing up to minA due to LOB when descending with no UE support and needs min guard when using B rails. encouraged her to place both feet on each step when descending, unclear if she understands   Wheelchair Mobility    Modified Rankin (Stroke Patients Only) Modified Rankin (Stroke Patients Only) Pre-Morbid Rankin Score: Moderate disability Modified Rankin: Moderately severe disability     Balance Overall balance assessment: Mild deficits observed, not formally tested        Cognition Arousal/Alertness: Awake/alert Behavior During Therapy: WFL for tasks assessed/performed Overall Cognitive Status: Difficult to assess      General Comments: follows gestures and ?understands some verbal commands; Pt unable to accurately answer questions. Pt told to turn left at doorway and turns  to right, then states "yeah, left, left" still turning right. Pt will stop when cued and walk opposite direction. Pt told room # is 7 then attempts to enter each room on Rt side of hallway from 15 down to  7.               Pertinent Vitals/Pain Pain  Assessment: No/denies pain    Home Living     Available Help at Discharge: Family Type of Home: House              PT Goals (current goals can now be found in the care plan section) Acute Rehab PT Goals Patient Stated Goal: unable to state due to aphasia PT Goal Formulation: Patient unable to participate in goal setting Time For Goal Achievement: 05/25/21 Progress towards PT goals: Progressing toward goals    Frequency    Min 4X/week      PT Plan Current plan remains appropriate       AM-PAC PT "6 Clicks" Mobility   Outcome Measure  Help needed turning from your back to your side while in a flat bed without using bedrails?: None Help needed moving from lying on your back to sitting on the side of a flat bed without using bedrails?: None Help needed moving to and from a bed to a chair (including a wheelchair)?: A Little Help needed standing up from a chair using your arms (e.g., wheelchair or bedside chair)?: A Little Help needed to walk in hospital room?: A Little Help needed climbing 3-5 steps with a railing? : A Little 6 Click Score: 20    End of Session Equipment Utilized During Treatment: Gait belt Activity Tolerance: Patient tolerated treatment well Patient left: in bed;with call bell/phone within reach;with bed alarm set Nurse Communication: Mobility status;Other (comment) PT Visit Diagnosis: Other abnormalities of gait and mobility (R26.89)     Time: 1683-7290 PT Time Calculation (min) (ACUTE ONLY): 13 min  Charges:  $Gait Training: 8-22 mins                     Trevonne Nyland P., PTA Acute Rehabilitation Services Pager: [phone] Office: [place] 05/12/2021, 2:02 PM

## 2021-05-15 ENCOUNTER — Inpatient Hospital Stay (HOSPITAL_COMMUNITY)
Admission: EM | Admit: 2021-05-15 | Discharge: 2021-05-18 | DRG: 065 | Disposition: A | Discharge status: Home / Self Care | Payer: Medicare Other | Attending: Student in an Organized Health Care Education/Training Program | Admitting: Student in an Organized Health Care Education/Training Program

## 2021-05-15 ENCOUNTER — Emergency Department (HOSPITAL_COMMUNITY): Payer: Medicare Other

## 2021-05-15 DIAGNOSIS — E1122 Type 2 diabetes mellitus with diabetic chronic kidney disease: Secondary | ICD-10-CM | POA: Diagnosis present

## 2021-05-15 DIAGNOSIS — Z881 Allergy status to other antibiotic agents status: Secondary | ICD-10-CM

## 2021-05-15 DIAGNOSIS — Z823 Family history of stroke: Secondary | ICD-10-CM

## 2021-05-15 DIAGNOSIS — Z888 Allergy status to other drugs, medicaments and biological substances status: Secondary | ICD-10-CM

## 2021-05-15 DIAGNOSIS — I639 Cerebral infarction, unspecified: Secondary | ICD-10-CM | POA: Diagnosis present

## 2021-05-15 DIAGNOSIS — K921 Melena: Secondary | ICD-10-CM | POA: Diagnosis present

## 2021-05-15 DIAGNOSIS — R29702 NIHSS score 2: Secondary | ICD-10-CM | POA: Diagnosis present

## 2021-05-15 DIAGNOSIS — M546 Pain in thoracic spine: Secondary | ICD-10-CM | POA: Diagnosis present

## 2021-05-15 DIAGNOSIS — R4020 Unspecified coma: Secondary | ICD-10-CM

## 2021-05-15 DIAGNOSIS — R29701 NIHSS score 1: Secondary | ICD-10-CM | POA: Diagnosis present

## 2021-05-15 DIAGNOSIS — Z885 Allergy status to narcotic agent status: Secondary | ICD-10-CM

## 2021-05-15 DIAGNOSIS — N1831 Chronic kidney disease, stage 3a: Secondary | ICD-10-CM | POA: Diagnosis present

## 2021-05-15 DIAGNOSIS — G8929 Other chronic pain: Secondary | ICD-10-CM | POA: Diagnosis present

## 2021-05-15 DIAGNOSIS — I129 Hypertensive chronic kidney disease with stage 1 through stage 4 chronic kidney disease, or unspecified chronic kidney disease: Secondary | ICD-10-CM | POA: Diagnosis present

## 2021-05-15 DIAGNOSIS — J45909 Unspecified asthma, uncomplicated: Secondary | ICD-10-CM | POA: Diagnosis present

## 2021-05-15 DIAGNOSIS — Z91041 Radiographic dye allergy status: Secondary | ICD-10-CM

## 2021-05-15 DIAGNOSIS — D6489 Other specified anemias: Secondary | ICD-10-CM | POA: Diagnosis present

## 2021-05-15 DIAGNOSIS — D473 Essential (hemorrhagic) thrombocythemia: Secondary | ICD-10-CM | POA: Diagnosis present

## 2021-05-15 DIAGNOSIS — Z79899 Other long term (current) drug therapy: Secondary | ICD-10-CM

## 2021-05-15 DIAGNOSIS — Z7901 Long term (current) use of anticoagulants: Secondary | ICD-10-CM

## 2021-05-15 DIAGNOSIS — R569 Unspecified convulsions: Secondary | ICD-10-CM

## 2021-05-15 DIAGNOSIS — R634 Abnormal weight loss: Secondary | ICD-10-CM | POA: Diagnosis present

## 2021-05-15 DIAGNOSIS — I959 Hypotension, unspecified: Secondary | ICD-10-CM | POA: Diagnosis not present

## 2021-05-15 DIAGNOSIS — I63511 Cerebral infarction due to unspecified occlusion or stenosis of right middle cerebral artery: Secondary | ICD-10-CM | POA: Diagnosis not present

## 2021-05-15 DIAGNOSIS — K219 Gastro-esophageal reflux disease without esophagitis: Secondary | ICD-10-CM | POA: Diagnosis present

## 2021-05-15 DIAGNOSIS — H2513 Age-related nuclear cataract, bilateral: Secondary | ICD-10-CM | POA: Diagnosis present

## 2021-05-15 DIAGNOSIS — Z882 Allergy status to sulfonamides status: Secondary | ICD-10-CM

## 2021-05-15 DIAGNOSIS — N63 Unspecified lump in unspecified breast: Secondary | ICD-10-CM | POA: Diagnosis present

## 2021-05-15 DIAGNOSIS — R4701 Aphasia: Secondary | ICD-10-CM | POA: Diagnosis present

## 2021-05-15 DIAGNOSIS — E785 Hyperlipidemia, unspecified: Secondary | ICD-10-CM | POA: Diagnosis present

## 2021-05-15 DIAGNOSIS — R55 Syncope and collapse: Secondary | ICD-10-CM | POA: Diagnosis not present

## 2021-05-15 DIAGNOSIS — I480 Paroxysmal atrial fibrillation: Secondary | ICD-10-CM | POA: Diagnosis present

## 2021-05-15 DIAGNOSIS — G40909 Epilepsy, unspecified, not intractable, without status epilepticus: Secondary | ICD-10-CM

## 2021-05-15 DIAGNOSIS — Z9884 Bariatric surgery status: Secondary | ICD-10-CM

## 2021-05-15 DIAGNOSIS — I69391 Dysphagia following cerebral infarction: Secondary | ICD-10-CM

## 2021-05-15 DIAGNOSIS — N1832 Chronic kidney disease, stage 3b: Secondary | ICD-10-CM | POA: Diagnosis present

## 2021-05-15 DIAGNOSIS — Z7902 Long term (current) use of antithrombotics/antiplatelets: Secondary | ICD-10-CM

## 2021-05-15 LAB — COMPREHENSIVE METABOLIC PANEL
ALT: 14 U/L (ref 0–44)
AST: 21 U/L (ref 15–41)
Albumin: 3.3 g/dL — ABNORMAL LOW (ref 3.5–5.0)
Alkaline Phosphatase: 125 U/L (ref 38–126)
Anion gap: 6 (ref 5–15)
BUN: 14 mg/dL (ref 8–23)
CO2: 19 mmol/L — ABNORMAL LOW (ref 22–32)
Calcium: 8.9 mg/dL (ref 8.9–10.3)
Chloride: 114 mmol/L — ABNORMAL HIGH (ref 98–111)
Creatinine, Ser: 1.54 mg/dL — ABNORMAL HIGH (ref 0.44–1.00)
GFR, Estimated: 37 mL/min — ABNORMAL LOW (ref >60)
Glucose, Bld: 139 mg/dL — ABNORMAL HIGH (ref 70–99)
Potassium: 4 mmol/L (ref 3.5–5.1)
Sodium: 139 mmol/L (ref 135–145)
Total Bilirubin: 0.7 mg/dL (ref 0.3–1.2)
Total Protein: 6.1 g/dL — ABNORMAL LOW (ref 6.5–8.1)

## 2021-05-15 LAB — CBC
HCT: 32.1 % — ABNORMAL LOW (ref 36.0–46.0)
Hemoglobin: 10.7 g/dL — ABNORMAL LOW (ref 12.0–15.0)
MCH: 28.4 pg (ref 26.0–34.0)
MCHC: 33.3 g/dL (ref 30.0–36.0)
MCV: 85.1 fL (ref 80.0–100.0)
Platelets: 348 10*3/uL (ref 150–400)
RBC: 3.77 MIL/uL — ABNORMAL LOW (ref 3.87–5.11)
RDW: 15.5 % (ref 11.5–15.5)
WBC: 5.1 10*3/uL (ref 4.0–10.5)
nRBC: 0 % (ref 0.0–0.2)

## 2021-05-15 LAB — DIFFERENTIAL
Abs Immature Granulocytes: 0.02 10*3/uL (ref 0.00–0.07)
Basophils Absolute: 0 10*3/uL (ref 0.0–0.1)
Basophils Relative: 1 %
Eosinophils Absolute: 0.1 10*3/uL (ref 0.0–0.5)
Eosinophils Relative: 2 %
Immature Granulocytes: 0 %
Lymphocytes Relative: 31 %
Lymphs Abs: 1.6 10*3/uL (ref 0.7–4.0)
Monocytes Absolute: 0.4 10*3/uL (ref 0.1–1.0)
Monocytes Relative: 8 %
Neutro Abs: 3 10*3/uL (ref 1.7–7.7)
Neutrophils Relative %: 58 %

## 2021-05-15 LAB — PROTIME-INR
INR: 1.3 — ABNORMAL HIGH (ref 0.8–1.2)
Prothrombin Time: 16.4 seconds — ABNORMAL HIGH (ref 11.4–15.2)

## 2021-05-15 LAB — I-STAT CHEM 8, ED
BUN: 16 mg/dL (ref 8–23)
Calcium, Ion: 1.2 mmol/L (ref 1.15–1.40)
Chloride: 112 mmol/L — ABNORMAL HIGH (ref 98–111)
Creatinine, Ser: 1.7 mg/dL — ABNORMAL HIGH (ref 0.44–1.00)
Glucose, Bld: 126 mg/dL — ABNORMAL HIGH (ref 70–99)
HCT: 31 % — ABNORMAL LOW (ref 36.0–46.0)
Hemoglobin: 10.5 g/dL — ABNORMAL LOW (ref 12.0–15.0)
Potassium: 4 mmol/L (ref 3.5–5.1)
Sodium: 141 mmol/L (ref 135–145)
TCO2: 21 mmol/L — ABNORMAL LOW (ref 22–32)

## 2021-05-15 LAB — APTT: aPTT: 29 seconds (ref 24–36)

## 2021-05-15 MED ORDER — SODIUM CHLORIDE 0.9% FLUSH
3.0000 mL | Freq: Once | INTRAVENOUS | Status: AC
Start: 2021-05-15 — End: 2021-05-15
  Administered 2021-05-15: 3 mL via INTRAVENOUS

## 2021-05-15 NOTE — Code Documentation (Signed)
Stroke Response Nurse Documentation Code Documentation  Joshalyn Ancheta is a 65 y.o. 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 On Eliquis (apixaban) daily. Code stroke was activated by EMS.   Patient from home where she was LKW at 2000 and now complaining of worsening speech after reported syncopal episode in the bathroom.   Stroke team at the bedside on patient arrival. Labs drawn and patient cleared for CT by Dr. Laverta Baltimore. Patient to CT with team. NIHSS 1, see documentation for details and code stroke times. Patient with Expressive aphasia  on exam. The following imaging was completed:  CT. Patient is not a candidate for IV Thrombolytic due to Eliquis. Patient is not a candidate for IR due to No LVO.   Care/Plan: Medical admit for stroke workup.  Bedside handoff with ED RN Catalina Antigua.    Madelynn Done  Rapid Response RN

## 2021-05-15 NOTE — ED Triage Notes (Signed)
EMS arrival. Code stroke. Daughter in [facility] called. Reports syncopal episode in bathroom. Guided to floor. Reports last seen normal at 2000 05/15/21.

## 2021-05-15 NOTE — ED Provider Notes (Signed)
[facility] EMERGENCY DEPARTMENT Provider Note   CSN: [id number] Arrival date & time: 05/15/21  2127     History Chief Complaint  Patient presents with   Loss of Consciousness    Courtney Burke is a 65 y.o. female.  HPI     This is a 65 year old female with a history of hypertension, hyperlipidemia, stroke, diabetes, seizures who presents with an episode of loss of consciousness.  Both the patient and her daughter provide history.  She has some residual expressive aphasia from prior stroke.  Patient reports that she began to feel hot and dizzy.  Daughter witnessed the episode and the patient reportedly lost consciousness.  The daughter was able to catch her and lowered her to the floor.  Daughter reports that she was completely unconscious.  Upon awakening, daughter noted that she appeared more confused but "finally came to."  She describes her eyes rolling in the back of her head.  No tonic-clonic movement noted though the patient does have a history of seizure.  Patient denies any chest pain, shortness of breath preceding the episode.  Recently admitted for a new stroke.  She is on Eliquis.  They report compliance with medications.  She initially came in as a code stroke and was evaluated by neurology.    Neurology recommending admission for seizure versus syncope work-up.  SPoke with Dr. Marcelle Overlie.  Past Medical History:  Diagnosis Date   Acute ischemic left MCA stroke ([place]) 06/30/2020   Acute ischemic stroke (HCC)    Acute stroke due to occlusion of left middle cerebral artery ([place]) 06/30/2020   Age-related nuclear cataract of both eyes 09/28/2015   Arthralgia of multiple joints 09/28/2015   Asthma    Borderline diabetes    Cerebrovascular accident (CVA) ([place])    Chronic bilateral thoracic back pain 06/05/2018   Chronic renal insufficiency, stage 1 08/11/2014   Diabetes mellitus type 2 in obese ([place])    Dyslipidemia    Dysphagia, post-stroke    Erosive  gastritis 07/20/2019   Formatting of this note might be different from the original. On EGD 07/2019   Esophagitis 07/20/2019   Formatting of this note might be different from the original. Added automatically from request for surgery [id number]  Formatting of this note might be different from the original. On EGD 07/2019   Essential hypertension 09/28/2015   Essential thrombocytosis ([place]) 09/28/2015   Gallstones 08/18/2019   Formatting of this note might be different from the original. 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   Hyperlipidemia, unspecified 03/16/2013   Hypertension    Keratoconjunctivitis sicca of both eyes not specified as Sjogren's 09/28/2015   Left middle cerebral artery stroke ([place]) 07/09/2020   Morbid obesity ([place]) 05/21/2013   Stage 3b chronic kidney disease ([place])    Status post gastric bypass for obesity 09/28/2015    Patient Active Problem List   Diagnosis Date Noted   Acute ischemic right MCA stroke ([place]) 05/11/2021   CVA (cerebral vascular accident) ([place]) 05/11/2021   Acute cerebral infarction ([place]) 05/10/2021   Dyslipidemia    Stage 3b chronic kidney disease ([place])    Dysphagia, post-stroke    Diabetes mellitus type 2 in obese ([place])    Gallstones 08/18/2019   Esophagitis 07/20/2019   Erosive gastritis 07/20/2019   Chronic bilateral thoracic back pain 06/05/2018   Age-related nuclear cataract of both eyes 09/28/2015   Asthma  09/28/2015   Essential hypertension 09/28/2015   Essential thrombocytosis ([place]) 09/28/2015   Gastroesophageal reflux disease without esophagitis 09/28/2015   Keratoconjunctivitis sicca of both eyes not specified as Sjogren's 09/28/2015   Status post gastric bypass for obesity 09/28/2015   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   Morbid obesity ([place]) 05/21/2013   Hyperlipidemia, unspecified 03/16/2013    Past Surgical History:   Procedure Laterality Date   ANKLE SURGERY     CARPAL TUNNEL RELEASE     carpel tunnel     IR ANGIO INTRA EXTRACRAN SEL COM CAROTID INNOMINATE BILAT MOD SED  01/17/2021   IR ANGIO VERTEBRAL SEL SUBCLAVIAN INNOMINATE UNI R MOD SED  01/17/2021   IR CT HEAD LTD  06/30/2020   IR INTRA CRAN STENT  06/30/2020   IR PERCUTANEOUS ART THROMBECTOMY/INFUSION INTRACRANIAL INC DIAG ANGIO  06/30/2020   IR RADIOLOGIST EVAL & MGMT  08/26/2020   IR US GUIDE VASC ACCESS RIGHT  01/17/2021   RADIOLOGY WITH ANESTHESIA N/A 06/30/2020   Procedure: RADIOLOGY WITH ANESTHESIA;  Surgeon: Radiologist, Medication, MD;  Location: [facility];  Service: Radiology;  Laterality: N/A;     OB History   No obstetric history on file.     Family History  Problem Relation Age of Onset   Stroke Maternal Grandfather     Social History   Tobacco Use   Smoking status: Never   Smokeless tobacco: Never  Vaping Use   Vaping Use: Never used  Substance Use Topics   Alcohol use: No   Drug use: No    Home Medications Prior to Admission medications   Medication Sig Start Date End Date Taking? Authorizing Provider  acetaminophen (TYLENOL) 500 MG tablet Take 1,000 mg by mouth every 6 (six) hours as needed for moderate pain or headache.   Yes [provider]  atorvastatin (LIPITOR) 80 MG tablet Take 1 tablet (80 mg total) by mouth at bedtime. 07/25/20  Yes Angiulli, Lavon Paganini, PA-C  ELIQUIS 5 MG TABS tablet TAKE 1 TABLET BY MOUTH TWICE A DAY Patient taking differently: Take 5 mg by mouth 2 (two) times daily. 02/08/21  Yes Camnitz, Will Hassell Done, MD  levETIRAcetam (KEPPRA) 500 MG tablet Take 1 tablet (500 mg total) by mouth 2 (two) times daily. 02/13/21  Yes Frann Rider, NP  Multiple Vitamin (MULTIVITAMIN WITH MINERALS) TABS tablet Take 1 tablet by mouth daily.   Yes [provider]  omeprazole (PRILOSEC) 40 MG capsule Take 40 mg by mouth daily. 07/28/20  Yes [provider]  ticagrelor (BRILINTA) 90 MG TABS tablet  Take 1 tablet (90 mg total) by mouth 2 (two) times daily. 09/30/20  Yes Camnitz, Will Hassell Done, MD  ursodiol (ACTIGALL) 250 MG tablet Take 250 mg by mouth 2 (two) times daily. 07/28/20  Yes [provider]  diphenhydrAMINE (BENADRYL) 50 MG tablet Take 1 tablet (50 mg total) by mouth at bedtime as needed for itching. Take Benadryl 50 mg at 7:30 am on 8/9 (with Prednisone 50 mg.) Patient not taking: Reported on 05/10/2021 01/13/21   Han, Aimee H, PA-C  Vitamin D3 (VITAMIN D) 25 MCG tablet TAKE 1 TABLET (1,000 UNITS TOTAL) BY MOUTH DAILY BEFORE BREAKFAST. Patient not taking: Reported on 05/10/2021 07/25/20 07/25/21  Angiulli, Lavon Paganini, PA-C    Allergies    Iodinated diagnostic agents, Doxycycline, Sulfa antibiotics, Codeine, Hydrocodone bit-homatrop mbr, and Ace inhibitors  Review of Systems   Review of Systems  Constitutional:  Negative for fever.  Respiratory:  Negative for shortness of  breath.   Cardiovascular:  Negative for chest pain.  Gastrointestinal:  Negative for abdominal pain.  Genitourinary:  Negative for dysuria.  Neurological:  Positive for dizziness, seizures, syncope and headaches.  All other systems reviewed and are negative.  Physical Exam Updated Vital Signs BP 133/69   Pulse 63   Temp 98.3 F (36.8 C) (Oral)   Resp 19   LMP 09/28/2012   SpO2 100%   Physical Exam Vitals and nursing note reviewed.  Constitutional:      Appearance: She is well-developed. She is not ill-appearing.  HENT:     Head: Normocephalic and atraumatic.     Mouth/Throat:     Mouth: Mucous membranes are moist.  Eyes:     Extraocular Movements: Extraocular movements intact.     Pupils: Pupils are equal, round, and reactive to light.  Cardiovascular:     Rate and Rhythm: Normal rate and regular rhythm.     Heart sounds: Normal heart sounds.  Pulmonary:     Effort: Pulmonary effort is normal. No respiratory distress.     Breath sounds: No wheezing.  Abdominal:     Palpations: Abdomen  is soft.     Tenderness: There is no abdominal tenderness.  Musculoskeletal:     Cervical back: Neck supple.     Right lower leg: No edema.     Left lower leg: No edema.  Skin:    General: Skin is warm and dry.  Neurological:     Mental Status: She is alert and oriented to person, place, and time.     Comments: Cranial nerves II through XII intact, occasional word finding difficulty with expressive aphasia, patient with some difficulty following commands, no dysmetria to finger-nose-finger, 5 out of 5 strength in all 4 extremities  Psychiatric:        Mood and Affect: Mood normal.    ED Results / Procedures / Treatments   Labs (all labs ordered are listed, but only abnormal results are displayed) Labs Reviewed  PROTIME-INR - Abnormal; Notable for the following components:      Result Value   Prothrombin Time 16.4 (*)    INR 1.3 (*)    All other components within normal limits  CBC - Abnormal; Notable for the following components:   RBC 3.77 (*)    Hemoglobin 10.7 (*)    HCT 32.1 (*)    All other components within normal limits  COMPREHENSIVE METABOLIC PANEL - Abnormal; Notable for the following components:   Chloride 114 (*)    CO2 19 (*)    Glucose, Bld 139 (*)    Creatinine, Ser 1.54 (*)    Total Protein 6.1 (*)    Albumin 3.3 (*)    GFR, Estimated 37 (*)    All other components within normal limits  I-STAT CHEM 8, ED - Abnormal; Notable for the following components:   Chloride 112 (*)    Creatinine, Ser 1.70 (*)    Glucose, Bld 126 (*)    TCO2 21 (*)    Hemoglobin 10.5 (*)    HCT 31.0 (*)    All other components within normal limits  APTT  DIFFERENTIAL  CBG MONITORING, ED    EKG EKG Interpretation  Date/Time:  Monday May 15 2021 21:58:14 EST Ventricular Rate:  65 PR Interval:  168 QRS Duration: 78 QT Interval:  409 QTC Calculation: 426 R Axis:   8 Text Interpretation: Sinus rhythm Abnormal R-wave progression, early transition Left ventricular  hypertrophy Confirmed by Audrey Thull,  Loma Sousa [phone]) on 05/15/2021 10:51:23 PM  Radiology CT HEAD CODE STROKE WO CONTRAST  Result Date: 05/15/2021 CLINICAL DATA:  Code stroke. EXAM: CT HEAD WITHOUT CONTRAST TECHNIQUE: Contiguous axial images were obtained from the base of the skull through the vertex without intravenous contrast. COMPARISON:  05/11/2021. FINDINGS: Brain: Slightly increased hypodensity in the right posterior temporal lobe and lateral occipital lobe, consistent with evolution of a previously noted acute infarct, without significant increase in size or evidence of acute hemorrhage. Additional more chronic left frontal, temporal, occipital, and parietal encephalomalacia is also unchanged. Remote right cerebellar infarct. No acute hemorrhage, mass, mass effect, or midline shift. No definite new area of infarction. No extra-axial collection or hydrocephalus. Vascular: Redemonstrated left M1 stent.  No hyperdense vessel. Skull: Normal. Negative for fracture or focal lesion. Sinuses/Orbits: No acute finding. Other: The mastoids are well aerated. ASPECTS [facility] Stroke Program Early CT Score) - Ganglionic level infarction (caudate, lentiform nuclei, internal capsule, insula, M1-M3 cortex): 7 - Supraganglionic infarction (M4-M6 cortex): 3 Total score (0-10 with 10 being normal): 10 IMPRESSION: 1. No acute intracranial process. 2. Slightly increased hypodensity in the right posterior temporal lobe and lateral occipital lobe, consistent with evolution of previously noted acute infarct. 3. ASPECTS is 10 Code stroke imaging results were communicated on 05/15/2021 at 9:47 pm to provider OSIAS via secure text paging. Electronically Signed   By: Merilyn Baba M.D.   On: 05/15/2021 21:49    Procedures Procedures   Medications Ordered in ED Medications  sodium chloride flush (NS) 0.9 % injection 3 mL (3 mLs Intravenous Given 05/15/21 2216)    ED Course  I have reviewed the triage vital signs and the  nursing notes.  Pertinent labs & imaging results that were available during my care of the patient were reviewed by me and considered in my medical decision making (see chart for details).  Clinical Course as of 05/15/21 2336  Mon May 15, 2021  2219 CBG monitoring, ED [JK]    Clinical Course User Index [JK] Dorie Rank, MD   MDM Rules/Calculators/A&P                           Patient presents with an episode of loss of consciousness.  Highly suspect seizure versus syncope.  Her prodrome is suggestive of syncope although the description of her confusion following the episode would potentially suggest a postictal state.  Code stroke was discontinued as low likelihood for new stroke per neurology.  Her aphasia is at baseline per her daughter.  Sure normal.  Labs.  Baseline.  EKG without any acute ischemic or arrhythmic changes.  Neurology does not recommend loading with antiepileptic at this time although they do recommend admission for seizure for syncope work-up and EEG.  Plan for admission to the hospitalist.   Final Clinical Impression(s) / ED Diagnoses Final diagnoses:  Loss of consciousness [facility])    Rx / DC Orders ED Discharge Orders     None        Merryl Hacker, MD 05/15/21 2339

## 2021-05-16 ENCOUNTER — Observation Stay (HOSPITAL_COMMUNITY): Payer: Medicare Other

## 2021-05-16 DIAGNOSIS — D473 Essential (hemorrhagic) thrombocythemia: Secondary | ICD-10-CM | POA: Diagnosis present

## 2021-05-16 DIAGNOSIS — N63 Unspecified lump in unspecified breast: Secondary | ICD-10-CM | POA: Diagnosis present

## 2021-05-16 DIAGNOSIS — R634 Abnormal weight loss: Secondary | ICD-10-CM | POA: Diagnosis present

## 2021-05-16 DIAGNOSIS — G40909 Epilepsy, unspecified, not intractable, without status epilepticus: Secondary | ICD-10-CM

## 2021-05-16 DIAGNOSIS — K219 Gastro-esophageal reflux disease without esophagitis: Secondary | ICD-10-CM | POA: Diagnosis present

## 2021-05-16 DIAGNOSIS — E1122 Type 2 diabetes mellitus with diabetic chronic kidney disease: Secondary | ICD-10-CM | POA: Diagnosis present

## 2021-05-16 DIAGNOSIS — R569 Unspecified convulsions: Secondary | ICD-10-CM

## 2021-05-16 DIAGNOSIS — K921 Melena: Secondary | ICD-10-CM | POA: Diagnosis present

## 2021-05-16 DIAGNOSIS — R29702 NIHSS score 2: Secondary | ICD-10-CM | POA: Diagnosis present

## 2021-05-16 DIAGNOSIS — I480 Paroxysmal atrial fibrillation: Secondary | ICD-10-CM | POA: Diagnosis present

## 2021-05-16 DIAGNOSIS — E785 Hyperlipidemia, unspecified: Secondary | ICD-10-CM | POA: Diagnosis present

## 2021-05-16 DIAGNOSIS — R55 Syncope and collapse: Secondary | ICD-10-CM | POA: Diagnosis present

## 2021-05-16 DIAGNOSIS — I129 Hypertensive chronic kidney disease with stage 1 through stage 4 chronic kidney disease, or unspecified chronic kidney disease: Secondary | ICD-10-CM | POA: Diagnosis present

## 2021-05-16 DIAGNOSIS — I959 Hypotension, unspecified: Secondary | ICD-10-CM | POA: Diagnosis not present

## 2021-05-16 DIAGNOSIS — I63511 Cerebral infarction due to unspecified occlusion or stenosis of right middle cerebral artery: Secondary | ICD-10-CM | POA: Diagnosis present

## 2021-05-16 DIAGNOSIS — N1832 Chronic kidney disease, stage 3b: Secondary | ICD-10-CM | POA: Diagnosis present

## 2021-05-16 DIAGNOSIS — Z8673 Personal history of transient ischemic attack (TIA), and cerebral infarction without residual deficits: Secondary | ICD-10-CM | POA: Diagnosis not present

## 2021-05-16 DIAGNOSIS — R4701 Aphasia: Secondary | ICD-10-CM | POA: Diagnosis present

## 2021-05-16 DIAGNOSIS — H2513 Age-related nuclear cataract, bilateral: Secondary | ICD-10-CM | POA: Diagnosis present

## 2021-05-16 DIAGNOSIS — I639 Cerebral infarction, unspecified: Secondary | ICD-10-CM | POA: Diagnosis not present

## 2021-05-16 DIAGNOSIS — G8929 Other chronic pain: Secondary | ICD-10-CM | POA: Diagnosis present

## 2021-05-16 DIAGNOSIS — R4020 Unspecified coma: Secondary | ICD-10-CM | POA: Diagnosis not present

## 2021-05-16 DIAGNOSIS — I69391 Dysphagia following cerebral infarction: Secondary | ICD-10-CM | POA: Diagnosis not present

## 2021-05-16 DIAGNOSIS — M546 Pain in thoracic spine: Secondary | ICD-10-CM | POA: Diagnosis present

## 2021-05-16 DIAGNOSIS — D6489 Other specified anemias: Secondary | ICD-10-CM | POA: Diagnosis present

## 2021-05-16 DIAGNOSIS — R29701 NIHSS score 1: Secondary | ICD-10-CM | POA: Diagnosis present

## 2021-05-16 DIAGNOSIS — Z823 Family history of stroke: Secondary | ICD-10-CM | POA: Diagnosis not present

## 2021-05-16 DIAGNOSIS — J45909 Unspecified asthma, uncomplicated: Secondary | ICD-10-CM | POA: Diagnosis present

## 2021-05-16 DIAGNOSIS — Z9884 Bariatric surgery status: Secondary | ICD-10-CM | POA: Diagnosis not present

## 2021-05-16 LAB — CBG MONITORING, ED
Glucose-Capillary: 96 mg/dL (ref 70–99)
Glucose-Capillary: 76 mg/dL (ref 70–99)

## 2021-05-16 LAB — BASIC METABOLIC PANEL
Anion gap: 7 (ref 5–15)
BUN: 13 mg/dL (ref 8–23)
CO2: 19 mmol/L — ABNORMAL LOW (ref 22–32)
Calcium: 9 mg/dL (ref 8.9–10.3)
Chloride: 112 mmol/L — ABNORMAL HIGH (ref 98–111)
Creatinine, Ser: 1.39 mg/dL — ABNORMAL HIGH (ref 0.44–1.00)
GFR, Estimated: 42 mL/min — ABNORMAL LOW (ref >60)
Glucose, Bld: 88 mg/dL (ref 70–99)
Potassium: 3.7 mmol/L (ref 3.5–5.1)
Sodium: 138 mmol/L (ref 135–145)

## 2021-05-16 LAB — CBC WITH DIFFERENTIAL/PLATELET
Abs Immature Granulocytes: 0.01 10*3/uL (ref 0.00–0.07)
Basophils Absolute: 0 10*3/uL (ref 0.0–0.1)
Basophils Relative: 1 %
Eosinophils Absolute: 0.1 10*3/uL (ref 0.0–0.5)
Eosinophils Relative: 2 %
HCT: 30.8 % — ABNORMAL LOW (ref 36.0–46.0)
Hemoglobin: 10.1 g/dL — ABNORMAL LOW (ref 12.0–15.0)
Immature Granulocytes: 0 %
Lymphocytes Relative: 33 %
Lymphs Abs: 1.6 10*3/uL (ref 0.7–4.0)
MCH: 27.8 pg (ref 26.0–34.0)
MCHC: 32.8 g/dL (ref 30.0–36.0)
MCV: 84.8 fL (ref 80.0–100.0)
Monocytes Absolute: 0.3 10*3/uL (ref 0.1–1.0)
Monocytes Relative: 6 %
Neutro Abs: 2.8 10*3/uL (ref 1.7–7.7)
Neutrophils Relative %: 58 %
Platelets: 328 10*3/uL (ref 150–400)
RBC: 3.63 MIL/uL — ABNORMAL LOW (ref 3.87–5.11)
RDW: 15.1 % (ref 11.5–15.5)
WBC: 4.9 10*3/uL (ref 4.0–10.5)
nRBC: 0 % (ref 0.0–0.2)

## 2021-05-16 LAB — GLUCOSE, CAPILLARY: Glucose-Capillary: 90 mg/dL (ref 70–99)

## 2021-05-16 LAB — TSH: TSH: 2.298 u[IU]/mL (ref 0.350–4.500)

## 2021-05-16 IMAGING — MR MR HEAD W/O CM
10 of 12 series · 35 of 48 positions shown · non-contrast
Comparison: Head CT yesterday.  MRI [DATE].

CLINICAL DATA: Stroke.  Follow-up.  Intracranial stent.

EXAM:
MRI HEAD WITHOUT CONTRAST
TECHNIQUE: Multiplanar, multiecho pulse sequences of the brain and surrounding
structures were obtained without intravenous contrast.

[Series 3: DWI · axial · 3.0mm · 1.09mm/px · z∈[-72,+66]mm · 8 of 94 slices shown (1 of 4)]
[im 1/94]
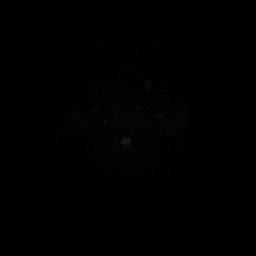
[im 11/94]
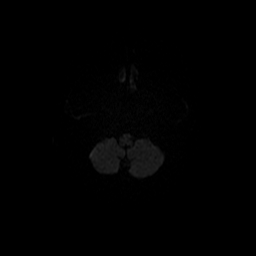
[im 32/94]
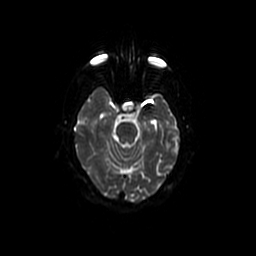
[im 42/94]
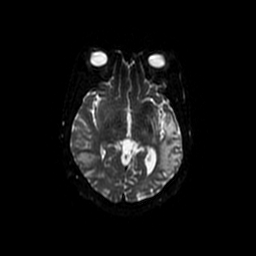
[im 52/94]
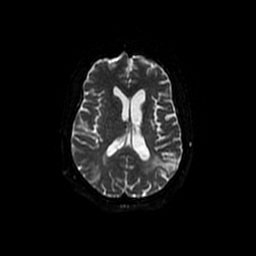
[im 63/94]
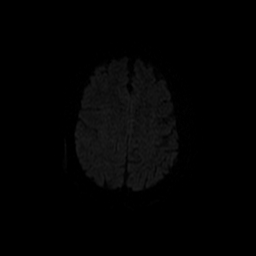
[im 83/94]
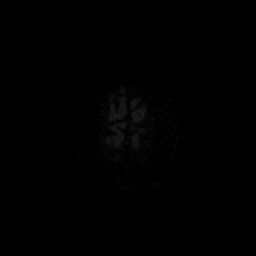
[im 94/94]
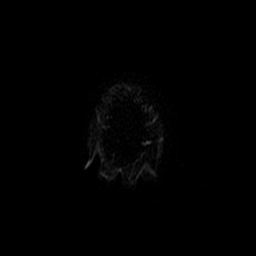

[Series 4: DWI · coronal · 5.0mm · 1.09mm/px · 7 of 68 slices shown (2 of 4)]
[im 1/68]
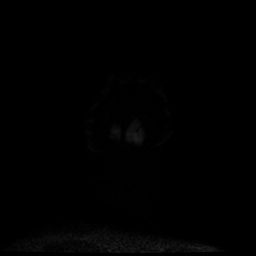
[im 12/68]
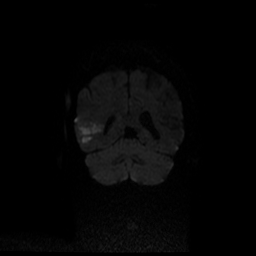
[im 23/68]
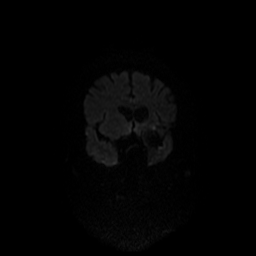
[im 34/68]
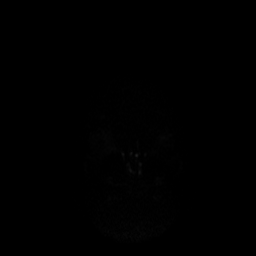
[im 45/68]
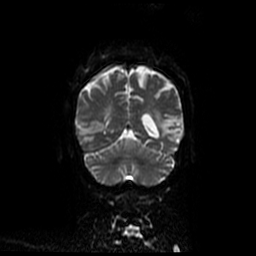
[im 56/68]
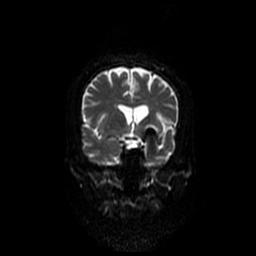
[im 68/68]
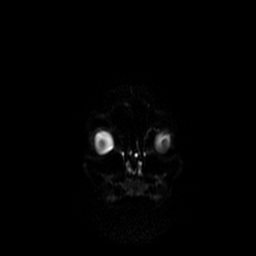

[Series 5: T1 · sagittal · 5.0mm · 0.47mm/px · 2 of 23 slices shown]
[im 1/23]
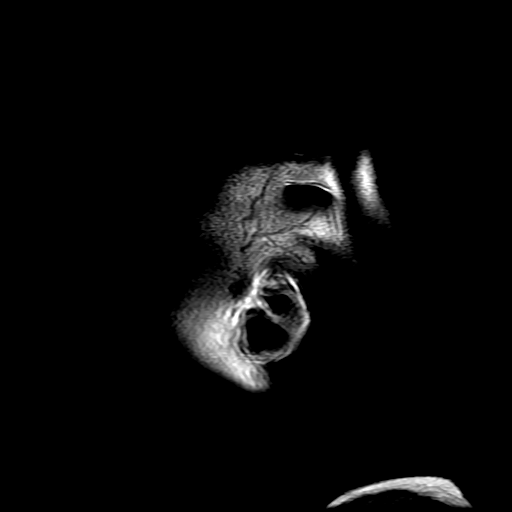
[im 23/23]
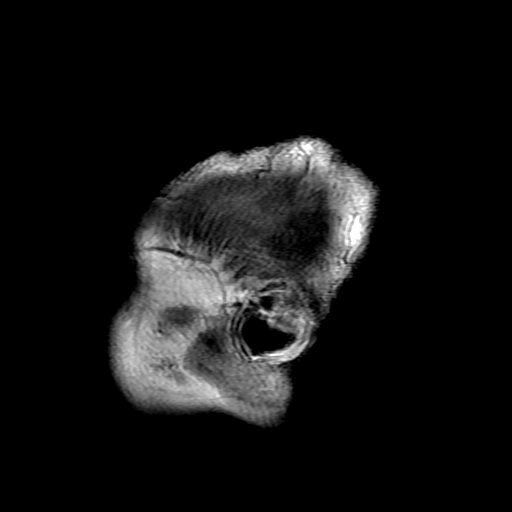

[Series 6: T2 · axial · 5.0mm · 0.43mm/px · z∈[-68,+69]mm · 2 of 24 slices shown (1 of 3)]
[im 1/24]
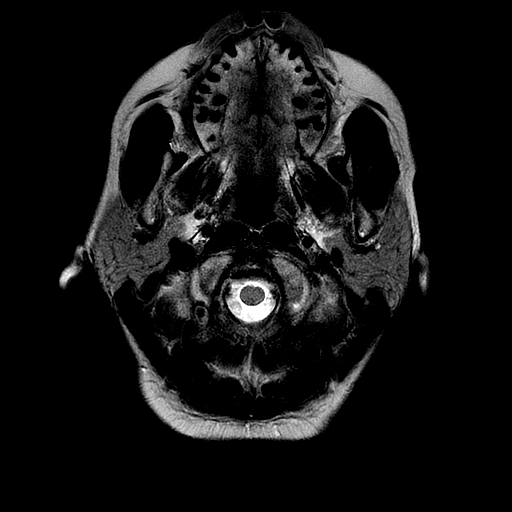
[im 24/24]
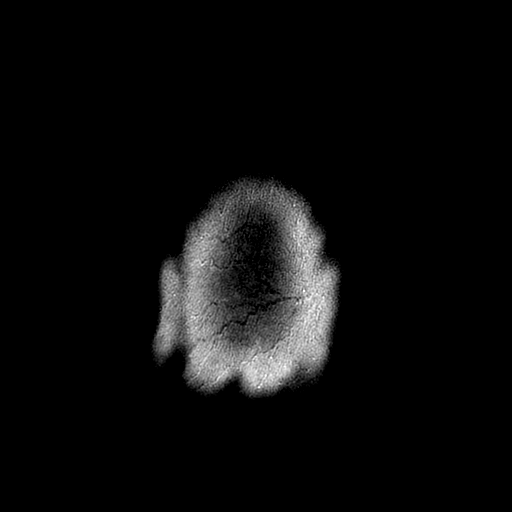

[Series 7: FLAIR · axial · 3.0mm · 0.43mm/px · z∈[-68,+69]mm · 2 of 24 slices shown (1 of 2)]
[im 1/24]
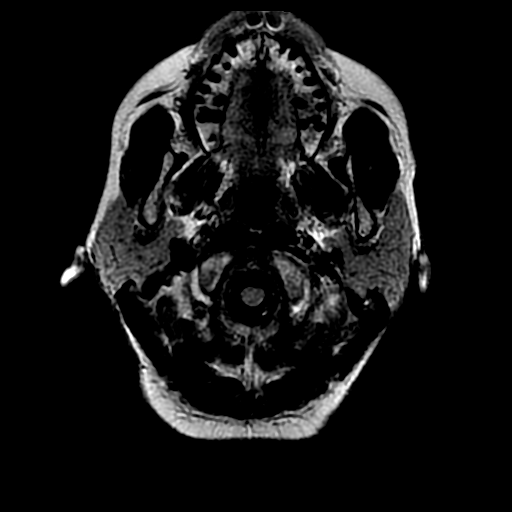
[im 24/24]
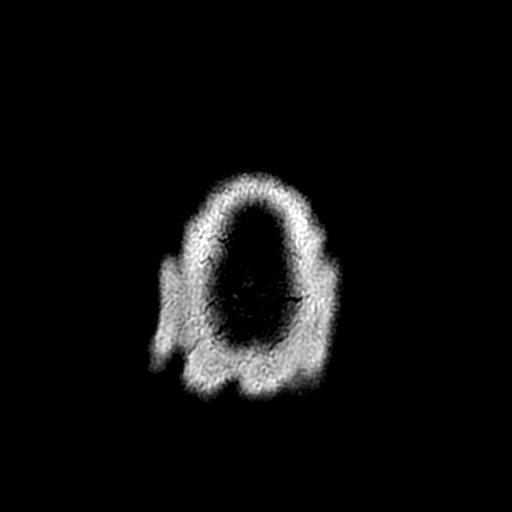

[Series 10: T2 · coronal · 5.0mm · 0.39mm/px · 2 of 25 slices shown (2 of 3)]
[im 1/25]
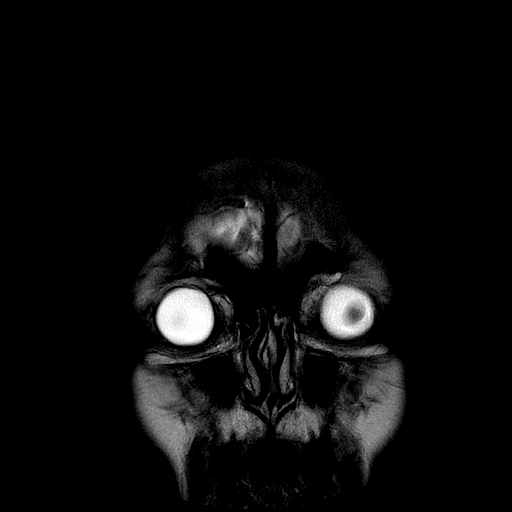
[im 25/25]
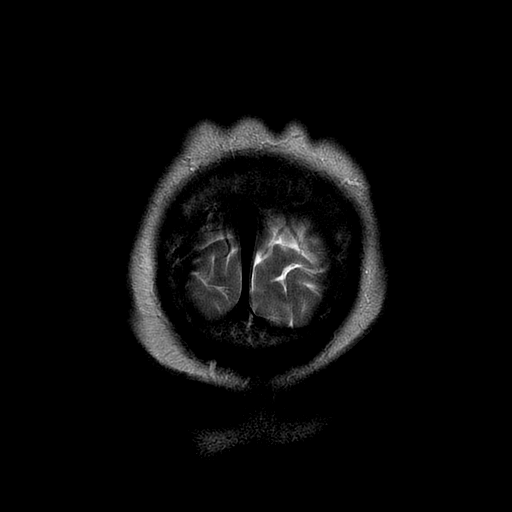

[Series 11: T2 · coronal · 3.0mm · 0.35mm/px · 2 of 23 slices shown (3 of 3)]
[im 1/23]
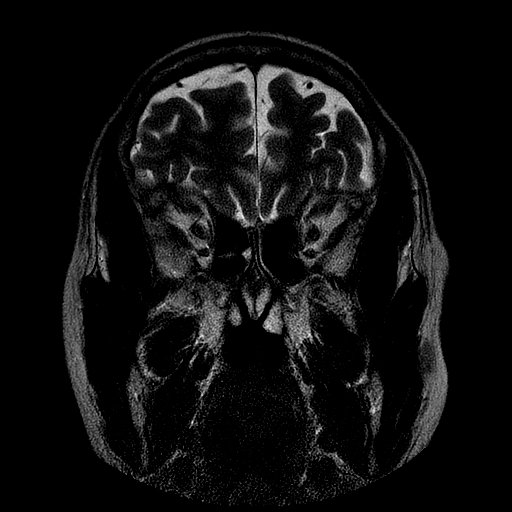
[im 23/23]
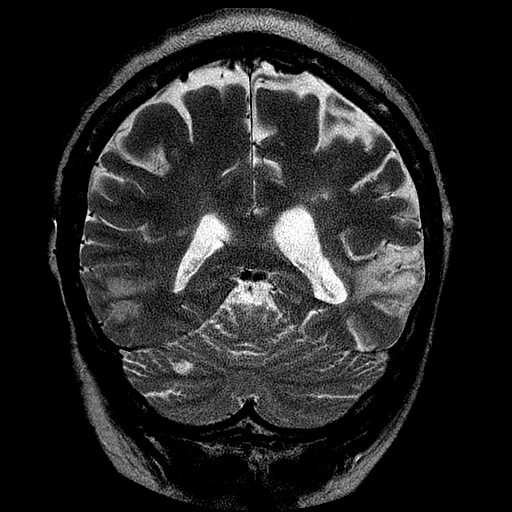

[Series 12: FLAIR · coronal · 3.0mm · 0.35mm/px · 2 of 23 slices shown (2 of 2)]
[im 1/23]
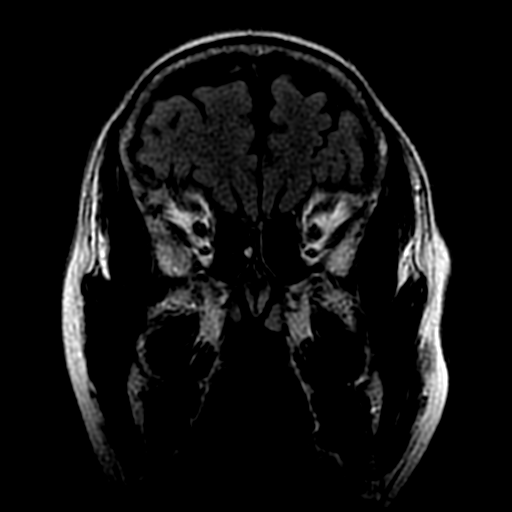
[im 23/23]
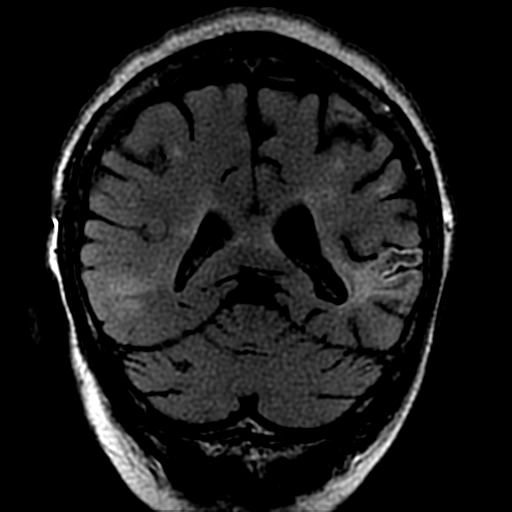

[Series 300: DWI · axial · 3.0mm · 1.09mm/px · z∈[-72,+66]mm · 5 of 47 slices shown (3 of 4)]
[im 1/47]
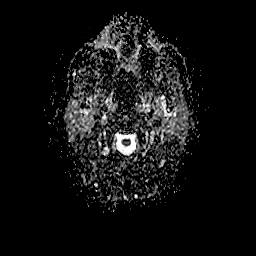
[im 12/47]
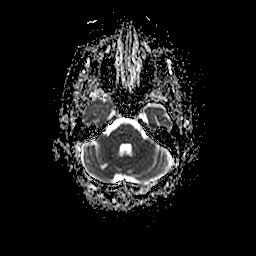
[im 24/47]
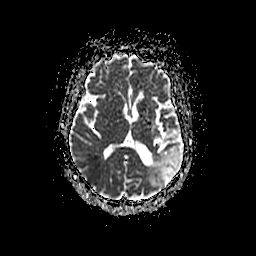
[im 35/47]
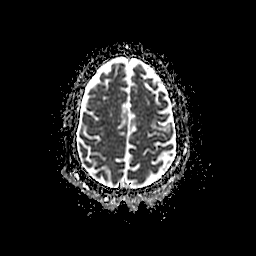
[im 47/47]
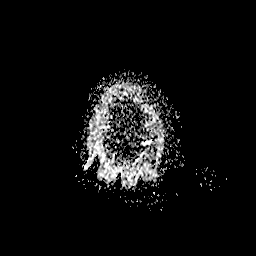

[Series 400: DWI · coronal · 5.0mm · 1.09mm/px · 3 of 34 slices shown (4 of 4)]
[im 1/34]
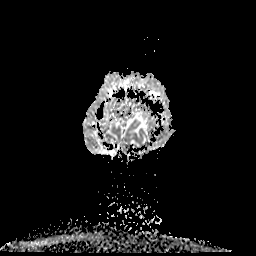
[im 17/34]
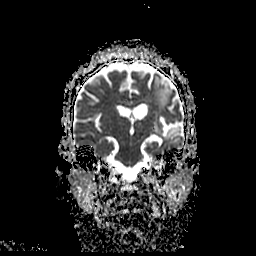
[im 34/34]
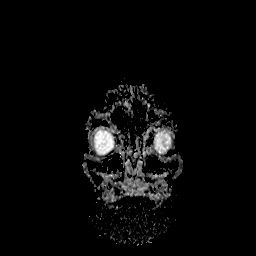

[35 of 48 positions shown; findings below may reference images not displayed]

FINDINGS: Brain: Compared to the study of 6 days ago, there is been some
extension of the acute infarction in the right posterior temporal
region. Volume of infarction is increased by 50%. New punctate
infarctions seen at the margins of the previous infarction. Other
punctate acute infarctions at the left temporoparietal junction and
left posterior frontal cortical brain are unchanged. Few punctate
infarctions in the right parietal region are unchanged. Old
infarction within the left MCA territory is unchanged, with some
hemosiderin deposition. Old small vessel infarctions of the
cerebellum as seen previously. No evidence of acute hemorrhage,
hydrocephalus or extra-axial collection.

Vascular: Major vessels at the base of the brain show flow. Artifact
present related to the left MCA stent.

Skull and upper cervical spine: Negative

Sinuses/Orbits: Clear/normal

Other: None
IMPRESSION: Extension of the acute infarction in the right MCA territory at the
right temporoparietal junction, particularly along the inferior
margin of the infarction and with a few tiny punctate satellite foci
additionally present. No evidence of hemorrhage in that region.

No change in small areas of acute infarction at the left
temporoparietal junction and left posterior frontal region are
unchanged. Few punctate acute infarctions in the right parietal lobe
are unchanged.

## 2021-05-16 MED ORDER — URSODIOL 300 MG PO CAPS
300.0000 mg | ORAL_CAPSULE | Freq: Two times a day (BID) | ORAL | Status: DC
  Administered 2021-05-16 – 2021-05-18 (×5): 300 mg via ORAL
  Filled 2021-05-16 (×8): qty 1

## 2021-05-16 MED ORDER — LACTATED RINGERS IV BOLUS
500.0000 mL | Freq: Once | INTRAVENOUS | Status: AC
  Administered 2021-05-16: 500 mL via INTRAVENOUS

## 2021-05-16 MED ORDER — ATORVASTATIN CALCIUM 40 MG PO TABS
40.0000 mg | ORAL_TABLET | Freq: Every day | ORAL | Status: DC
  Administered 2021-05-16 – 2021-05-17 (×2): 40 mg via ORAL
  Filled 2021-05-16 (×2): qty 1

## 2021-05-16 MED ORDER — ACETAMINOPHEN 325 MG PO TABS: 650.0000 mg | ORAL_TABLET | Freq: Four times a day (QID) | ORAL | Status: DC | PRN

## 2021-05-16 MED ORDER — ONDANSETRON HCL 4 MG/2ML IJ SOLN: 4.0000 mg | Freq: Four times a day (QID) | INTRAMUSCULAR | Status: DC | PRN

## 2021-05-16 MED ORDER — ONDANSETRON HCL 4 MG PO TABS: 4.0000 mg | ORAL_TABLET | Freq: Four times a day (QID) | ORAL | Status: DC | PRN

## 2021-05-16 MED ORDER — APIXABAN 5 MG PO TABS
5.0000 mg | ORAL_TABLET | Freq: Two times a day (BID) | ORAL | Status: DC
  Administered 2021-05-16 – 2021-05-18 (×6): 5 mg via ORAL
  Filled 2021-05-16 (×6): qty 1

## 2021-05-16 MED ORDER — LACTATED RINGERS IV SOLN: INTRAVENOUS | Status: AC

## 2021-05-16 MED ORDER — TICAGRELOR 90 MG PO TABS
90.0000 mg | ORAL_TABLET | Freq: Two times a day (BID) | ORAL | Status: DC
  Administered 2021-05-16: 90 mg via ORAL
  Filled 2021-05-16: qty 1

## 2021-05-16 MED ORDER — LACTATED RINGERS IV SOLN: INTRAVENOUS | Status: DC

## 2021-05-16 MED ORDER — SODIUM CHLORIDE 0.9% FLUSH
3.0000 mL | Freq: Two times a day (BID) | INTRAVENOUS | Status: DC
  Administered 2021-05-16 – 2021-05-17 (×4): 3 mL via INTRAVENOUS

## 2021-05-16 MED ORDER — ADULT MULTIVITAMIN W/MINERALS CH
1.0000 | ORAL_TABLET | Freq: Every day | ORAL | Status: DC
Start: 2021-05-16 — End: 2021-05-18
  Administered 2021-05-16 – 2021-05-18 (×3): 1 via ORAL
  Filled 2021-05-16 (×3): qty 1

## 2021-05-16 MED ORDER — LEVETIRACETAM 500 MG PO TABS
500.0000 mg | ORAL_TABLET | Freq: Two times a day (BID) | ORAL | Status: DC
  Administered 2021-05-16 – 2021-05-17 (×4): 500 mg via ORAL
  Filled 2021-05-16 (×4): qty 1

## 2021-05-16 MED ORDER — ASPIRIN EC 81 MG PO TBEC
81.0000 mg | DELAYED_RELEASE_TABLET | Freq: Every day | ORAL | Status: DC
  Administered 2021-05-16 – 2021-05-18 (×3): 81 mg via ORAL
  Filled 2021-05-16 (×3): qty 1

## 2021-05-16 MED ORDER — ACETAMINOPHEN 650 MG RE SUPP: 650.0000 mg | Freq: Four times a day (QID) | RECTAL | Status: DC | PRN

## 2021-05-16 MED ORDER — ATORVASTATIN CALCIUM 40 MG PO TABS
80.0000 mg | ORAL_TABLET | Freq: Every day | ORAL | Status: DC
  Administered 2021-05-16: 80 mg via ORAL
  Filled 2021-05-16: qty 2

## 2021-05-16 MED ORDER — PANTOPRAZOLE SODIUM 40 MG PO TBEC
40.0000 mg | DELAYED_RELEASE_TABLET | Freq: Every day | ORAL | Status: DC
  Administered 2021-05-16 – 2021-05-18 (×3): 40 mg via ORAL
  Filled 2021-05-16 (×3): qty 1

## 2021-05-16 NOTE — Consult Note (Signed)
Neurology Consultation  Reason for Consult: Code Stroke Referring Physician: Thayer Jew, MD  CC: Syncope and aphasia  History is obtained from: Patient  HPI: Courtney Burke is a 65 y.o. female with hx of HLD, HTN, DM, CVA s/p L MCA thrombectomy and stenting on Eliquis, seizure d/o on Keppra p/w aphasia.  EMS pre notification for code stroke.  Pt was seen and examined upon arrival. LKW 8PM.  Pt was found to have worsened aphasia after syncopal event.  NIHSS 1.  Head CT negative for acute intracranial pathology. Pt was not a candidate for tPA due to eliquis use.  Pt to be admitted for syncope vs seizure workup.  Pt was admitted on 12/7 for HA and aphasia  MRI brain:  1. Acute ischemic nonhemorrhagic infarct involving the right temporoccipital junction, posterior right MCA distribution. 2. Chronic left MCA territory infarcts, stable from prior. 3. Additional small remote right cerebellar infarct.  MRA head:  1. Negative intracranial MRA for large vessel occlusion. 2. Approximate 50% stenosis at the supraclinoid right ICA, with probable additional short-segment severe proximal right M2 stenosis. 3. Vascular stent in place within the left M1 segment. Flow through the stent itself not evaluated by MRA, although patent flow seen distal to the stent. 4. Short-segment severe left M2 stenosis, similar to previous. 5. Wide patency of the posterior circulation.   LKW: 8PM tpa given?: no, eliquis use Premorbid modified Rankin scale (mRS): 1  NIHSS 1  Past Medical History:  Diagnosis Date   Acute ischemic left MCA stroke ([place]) 06/30/2020   Acute ischemic stroke (HCC)    Acute stroke due to occlusion of left middle cerebral artery ([place]) 06/30/2020   Age-related nuclear cataract of both eyes 09/28/2015   Arthralgia of multiple joints 09/28/2015   Asthma    Borderline diabetes    Cerebrovascular accident (CVA) ([place])    Chronic bilateral thoracic back pain 06/05/2018   Chronic renal  insufficiency, stage 1 08/11/2014   Diabetes mellitus type 2 in obese ([place])    Dyslipidemia    Dysphagia, post-stroke    Erosive gastritis 07/20/2019   Formatting of this note might be different from the original. On EGD 07/2019   Esophagitis 07/20/2019   Formatting of this note might be different from the original. Added automatically from request for surgery [id number]  Formatting of this note might be different from the original. On EGD 07/2019   Essential hypertension 09/28/2015   Essential thrombocytosis ([place]) 09/28/2015   Gallstones 08/18/2019   Formatting of this note might be different from the original. 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 She has never used smokeless tobacco. She reports that she does not drink alcohol and does not use drugs.  Medications No current facility-administered medications for this encounter.  Current Outpatient Medications:    acetaminophen (TYLENOL) 500 MG tablet, Take 1,000 mg by mouth every 6 (six) hours as needed for moderate pain or headache.,  Disp: , Rfl:    atorvastatin (LIPITOR) 80 MG tablet, Take 1 tablet (80 mg total) by mouth at bedtime., Disp: 30 tablet, Rfl: 0   ELIQUIS 5 MG TABS tablet, TAKE 1 TABLET BY MOUTH TWICE A DAY (Patient taking differently: Take 5 mg by mouth 2 (two) times daily.), Disp: 60 tablet, Rfl: 3   levETIRAcetam (KEPPRA) 500 MG tablet, Take 1 tablet (500 mg total)  by mouth 2 (two) times daily., Disp: 180 tablet, Rfl: 3   Multiple Vitamin (MULTIVITAMIN WITH MINERALS) TABS tablet, Take 1 tablet by mouth daily., Disp: , Rfl:    omeprazole (PRILOSEC) 40 MG capsule, Take 40 mg by mouth daily., Disp: , Rfl:    ticagrelor (BRILINTA) 90 MG TABS tablet, Take 1 tablet (90 mg total) by mouth 2 (two) times daily., Disp: 180 tablet, Rfl: 3   ursodiol (ACTIGALL) 250 MG tablet, Take 250 mg by mouth 2 (two) times daily., Disp: , Rfl:    diphenhydrAMINE (BENADRYL) 50 MG tablet, Take 1 tablet (50 mg total) by mouth at bedtime as needed for itching. Take Benadryl 50 mg at 7:30 am on 8/9 (with Prednisone 50 mg.) (Patient not taking: Reported on 05/10/2021), Disp: 30 tablet, Rfl: 0   Vitamin D3 (VITAMIN D) 25 MCG tablet, TAKE 1 TABLET (1,000 UNITS TOTAL) BY MOUTH DAILY BEFORE BREAKFAST. (Patient not taking: Reported on 05/10/2021), Disp: 60 tablet, Rfl: 0  ROS: headache, aphasia As per HPI   Exam: Current vital signs: BP 133/69   Pulse 63   Temp 98.3 F (36.8 C) (Oral)   Resp 19   LMP 09/28/2012   SpO2 100%  Vital signs in last 24 hours: Temp:  [98.3 F (36.8 C)] 98.3 F (36.8 C) (12/12 2158) Pulse Rate:  [63-73] 63 (12/12 2300) Resp:  [12-19] 19 (12/12 2300) BP: (114-133)/(67-97) 133/69 (12/12 2300) SpO2:  [99 %-100 %] 100 % (12/12 2300)   Constitutional: Appears well-developed and well-nourished.  Psych: Affect appropriate to situation Eyes: No scleral injection HENT: No OP obstrucion Head: Normocephalic.  Cardiovascular: Normal rate and regular rhythm.  Respiratory: Effort normal, non-labored breathing GI: Soft.  No distension. There is no tenderness.  Skin: WDI  Neuro: AAOx2, fluent speech, no dysarthria, able to name and repeat, word salad, follows simple commands EOMI, PERRL, no facial asymmetry Tongue and palate midline Nl bulk and tone,no drift Moves all ext antigravity Sensation grossly intact  Labs I have reviewed labs in epic and the  results pertinent to this consultation are:  CBC    Component Value Date/Time   WBC 5.1 05/15/2021 2129   RBC 3.77 (L) 05/15/2021 2129   HGB 10.5 (L) 05/15/2021 2137   HCT 31.0 (L) 05/15/2021 2137   PLT 348 05/15/2021 2129   MCV 85.1 05/15/2021 2129   MCH 28.4 05/15/2021 2129   MCHC 33.3 05/15/2021 2129   RDW 15.5 05/15/2021 2129   LYMPHSABS 1.6 05/15/2021 2129   MONOABS 0.4 05/15/2021 2129   EOSABS 0.1 05/15/2021 2129   BASOSABS 0.0 05/15/2021 2129    CMP     Component Value Date/Time   NA 141 05/15/2021 2137   K 4.0 05/15/2021 2137   CL 112 (H) 05/15/2021 2137   CO2 19 (L) 05/15/2021 2129   GLUCOSE 126 (H) 05/15/2021 2137   BUN 16 05/15/2021 2137   CREATININE 1.70 (H) 05/15/2021 2137   CALCIUM 8.9 05/15/2021 2129   PROT 6.1 (L) 05/15/2021 2129   ALBUMIN 3.3 (L) 05/15/2021 2129   AST 21 05/15/2021 2129  ALT 14 05/15/2021 2129   ALKPHOS 125 05/15/2021 2129   BILITOT 0.7 05/15/2021 2129   GFRNONAA 37 (L) 05/15/2021 2129   GFRAA 39 (L) 07/08/2019 1202    Lipid Panel     Component Value Date/Time   CHOL 82 05/11/2021 0416   TRIG 62 05/11/2021 0416   HDL 49 05/11/2021 0416   CHOLHDL 1.7 05/11/2021 0416   VLDL 12 05/11/2021 0416   LDLCALC 21 05/11/2021 0416      Assessment: Syncope vs stroke vs Seizure  Impression: 65 yo F with hx recent CVA presenting aphasia, headache s/p syncopal event.  Pt was recently in hospital for similar complaints and was found to have acute infarcts.  Recommendations: Admit for syncope work up Consider repeating MRI brain Cont keppra 500mg  bid EEG monitoring Neuro will follow  Total time spent 66min  Ray Church, MD Attending Neurologist

## 2021-05-16 NOTE — ED Notes (Signed)
°   05/16/21 1533 05/16/21 1535 05/16/21 1536  Vitals  BP 130/70 136/77 124/79  MAP (mmHg) 88 96 92  Patient Position (if appropriate) Lying Sitting Standing  Pulse Rate 65 65 64  ECG Heart Rate 64 68 74  Resp 20 12 15   MEWS COLOR  MEWS Score Color [place]  Oxygen Therapy  SpO2 100 % 100 % 100 %   Orthostatic vitals. Patient tolerated well, denies dizziness

## 2021-05-16 NOTE — H&P (Signed)
Date: 05/16/2021               Patient Name:  Courtney Burke MRN: [id number]  DOB: 1956-01-20 Age / Sex: 65 y.o., female   PCP: [name], PA-C         Medical Service: Internal Medicine Teaching Service         Attending Physician: Dr. Evette Doffing, Mallie Mussel, *    First Contact: Dr. Ileene Musa Pager: [phone]  Second Contact: Dr. Lisabeth Devoid Pager: [phone]       After Hours (After 5p/  First Contact Pager: [phone]  weekends / holidays): Second Contact Pager: [phone]   Chief Complaint: Loss of consciousness  History of Present Illness: Ms. Belladonna Lubinski is a 65 year old female with obesity, hypertension, type 2 diabetes, chronic kidney disease, gastric bypass surgery, expressive aphasia, previous left MCA stroke status post stenting and thrombectomy on Eliquis and recent right MCA cerebral infarction presenting after loss of consciousness.  Code stroke was called however discontinued on arrival based on presentation.    Patient was discharged home on Friday and doing well until this morning around 9 AM when she suddenly felt hot and started sweating.  She told her daughter that she needed to lay down and she was walking back towards her bed she collapsed.  Fortunately her daughter caught her and she did not fall or hit her head.  Daughter states that she saw her mother's eyes rolled back and she was drooling.  Denies any shaking or tremors.  States that it took a few minutes for her mother to come to.  She called EMS.  Patient does not recall majority of these events.  She does state that she has these episodes of feeling very hot and sweating nearly daily for over a year.  She has both heat and cold intolerance.  Denies any chest pain, shortness of breath, changes in her vision, weakness, worsening expressive aphasia, nausea, vomiting, abdominal pain, difficulty urinating diarrhea or constipation.  No episodes of incontinence during the episode.  Patient does endorse a  headache at this time around her left orbit.  States that this migrates and sometimes is on the right side.  Daughter states that she has been having severe headaches since before her stroke and requires a lot of medications for this.  She has been eating less, daughter notes that she is getting pickier.  Endorses good fluid intake.  States that she has had significant weight loss over the past year.  She also reports 2 episodes of bright red blood in her stool over the past few months.  She did have a colonoscopy scheduled but was in the hospital at that time and has not rescheduled.  She also endorses multiple breast lumps that she has noticed.  She was scheduled to have a mammogram but this was also not done because she was in the hospital.   Meds:  Current Meds  Medication Sig   acetaminophen (TYLENOL) 500 MG tablet Take 1,000 mg by mouth every 6 (six) hours as needed for moderate pain or headache.   atorvastatin (LIPITOR) 80 MG tablet Take 1 tablet (80 mg total) by mouth at bedtime.   ELIQUIS 5 MG TABS tablet TAKE 1 TABLET BY MOUTH TWICE A DAY (Patient taking differently: Take 5 mg by mouth 2 (two) times daily.)   levETIRAcetam (KEPPRA) 500 MG tablet Take 1 tablet (500 mg total) by mouth 2 (two) times daily.   Multiple Vitamin (MULTIVITAMIN WITH MINERALS)  TABS tablet Take 1 tablet by mouth daily.   omeprazole (PRILOSEC) 40 MG capsule Take 40 mg by mouth daily.   ticagrelor (BRILINTA) 90 MG TABS tablet Take 1 tablet (90 mg total) by mouth 2 (two) times daily.   ursodiol (ACTIGALL) 250 MG tablet Take 250 mg by mouth 2 (two) times daily.     Allergies: Allergies as of 05/15/2021 - Review Complete 05/15/2021  Allergen Reaction Noted   Iodinated diagnostic agents Itching 08/21/2019   Doxycycline Nausea And Vomiting 09/28/2015   Sulfa antibiotics Rash and Hives 03/16/2013   Codeine Hives and Swelling 09/28/2012   Hydrocodone bit-homatrop mbr Itching 05/15/2017   Ace inhibitors Cough, Itching,  and Rash 09/28/2015   Past Medical History:  Diagnosis Date   Acute ischemic left MCA stroke ([place]) 06/30/2020   Acute ischemic stroke (HCC)    Acute stroke due to occlusion of left middle cerebral artery ([place]) 06/30/2020   Age-related nuclear cataract of both eyes 09/28/2015   Arthralgia of multiple joints 09/28/2015   Asthma    Borderline diabetes    Cerebrovascular accident (CVA) ([place])    Chronic bilateral thoracic back pain 06/05/2018   Chronic renal insufficiency, stage 1 08/11/2014   Diabetes mellitus type 2 in obese ([place])    Dyslipidemia    Dysphagia, post-stroke    Erosive gastritis 07/20/2019   Formatting of this note might be different from the original. On EGD 07/2019   Esophagitis 07/20/2019   Formatting of this note might be different from the original. Added automatically from request for surgery [id number]  Formatting of this note might be different from the original. On EGD 07/2019   Essential hypertension 09/28/2015   Essential thrombocytosis ([place]) 09/28/2015   Gallstones 08/18/2019   Formatting of this note might be different from the original. 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  Social History: Lives at home with her daughter who assists in managing her medications and makes her meals.  Patient is able to complete most of her ADLs independently-able to ambulate on her own, uses the bathroom and bathes herself is also able to feed herself.  Previously worked as an Therapist, sports.  Denies any tobacco, alcohol or  illicit drug use  Review of Systems: A complete ROS was negative except as per HPI.   Physical Exam: Blood pressure (!) 144/127, pulse 74, temperature 98.3 F (36.8 C), temperature source Oral, resp. rate 13, last menstrual period 09/28/2012, SpO2 99 %. Physical Exam General: alert, appears stated age, in no acute distress HEENT: Normocephalic, atraumatic, EOM intact, conjunctiva normal CV: Regular rate and rhythm, no murmurs rubs or gallops Pulm: Clear to auscultation bilaterally, normal work of breathing Abdomen: Soft, nondistended, bowel sounds present, no tenderness to palpation MSK: No lower extremity edema Skin: Warm and dry, did become diaphoretic during examination briefly.  2 approximately 1 cm nontender right breast lumps appreciated in the approximately 3:00 and 5:00 positions, nontender Neuro: Alert and oriented x3, cranial nerves intact, bilateral upper and lower extremity strength 5 out of 5, sensation intact, expressive aphasia  EKG: personally reviewed my interpretation is NSR  CXR: n/a  CT head without contrast IMPRESSION: 1. No acute intracranial process. 2. Slightly increased hypodensity in the right posterior temporal lobe and lateral occipital lobe, consistent with evolution of previously noted acute infarct. 3. ASPECTS is 10  Assessment & Plan by Problem: Principal Problem:   Syncope Active Problems:   Asthma   Stage 3b chronic kidney disease ([place])   CVA (cerebral vascular accident) ([place])   Breast lump   Seizure ([place])  Ms. Aaron Mose is a 65 year old female with obesity, hypertension, type 2 diabetes, chronic kidney disease, gastric bypass surgery, expressive aphasia, previous left MCA stroke status post stenting and thrombectomy on Eliquis and recent right MCA cerebral infarction presenting after loss of consciousness.  Code stroke was called however discontinued on arrival based on presentation.  Admitted for syncope versus seizure.  Syncope  versus seizure Patient presented after an episode of syncope versus seizure.  Around 9 AM this morning she felt a prodromal symptom of feeling significantly hot and sweating.  Was trying to get back into bed and subsequently fell into her daughter's arms.  Daughter reports her eyes rolled back and she was drooling.  States the episode lasted several minutes before her mother came to answer following simple commands.  Code stroke was called on arrival however subsequently discontinued due to her presentation.  CT head shows evolving infarct but no new acute abnormality.  Neurology was consulted and recommends syncope work-up and EEG.  Patient's daughter endorses that she has had good oral intake however has had these episodes of feeling hot and sweating for over a year.  Patient is at her baseline without any new neurological deficits. Lab work is unremarkable. Suspect that her presentation may be due to syncope.  Of note she does have a history of seizures and has been consistently taking her Keppra.  -Neurology following, appreciate recommendations -Continue cardiac monitoring -Follow-up EEG -Consider MRI -Seizure precautions -Check orthostatics -Check TSH -Continue Keppra 500 mg twice daily  Recent right MCA stroke History of left MCA stroke status post stenting and thrombectomy Patient was just discharged on 12/9 for a right MCA cerebral infarct with no new  neurological deficits.  Previous left ischemic MCA infarct in January 2022 with minor residual right-sided weakness and expressive aphasia.  Discharged home with PT OT home health orders and continued on Eliquis, atorvastatin and Brilinta.  Brilinta stop date noted to be 12/13, will confirm with neurology.  Paroxysmal atrial fibrillation In normal sinus rhythm.  Continue Eliquis.  Stage IIIb chronic kidney disease Creatinine 1.5 on admission, baseline ~1.3-1.4.  -Trend BMP -Avoid nephrotoxic agents  Type 2 DM Hemoglobin A1c on 12/8 was  5.5.  Currently not on any diabetic medications.  Blood sugars have been normal since arrival. -CBG monitoring  Chronic normocytic anemia Hemoglobin stable at 10.7.  No signs or symptoms of bleed. -Trend CBC  GERD Home medication includes omeprazole 40 mg daily, continue Protonix 40 mg.  Breast lumps Patient complains of multiple breast lumps in her right breast, 2 of which were appreciated on exam.  1 was midline slightly closer to the right breast at approximately 3 o'clock position and nontender to palpation.  The second 1 was along the inferior margin of her right breast at the approximate 5 o'clock position.  No skin changes noted.  Patient is due for a breast mammogram however this was not done because she was admitted into the hospital.  Will need to arrange  for a diagnostic mammogram.  Hematochezia Patient reports 2 episodes of bright red blood in her stool over the past few months.  She was scheduled to have a colonoscopy however this was not done due to her being in the hospital.  Will need to reschedule  Diet: CM VTE ppx: eliquis Full code  Dispo: Admit patient to Observation with expected length of stay less than 2 midnights.  SignedMike Craze, DO 05/16/2021, 2:08 AM  Pager: [phone] After 5pm on weekdays and 1pm on weekends: On Call pager: [phone]

## 2021-05-16 NOTE — Progress Notes (Signed)
Patient unavailable for EEG due to MRI.  Will re-attempt.

## 2021-05-16 NOTE — Progress Notes (Signed)
HD#0 Subjective:  Overnight Events: NAEO   Patient says she really wasn't feeling well at home. She was throwing up and got hot and cold and then she passed out. Has had a headache some as well. Feeling a bit better this morning.  Objective:  Vital signs in last 24 hours: Vitals:   05/16/21 0700 05/16/21 0715 05/16/21 0745 05/16/21 0800  BP: 128/73 131/73 118/69 98/71  Pulse: (!) 55 (!) 57 63 87  Resp: 11 13 17 11   Temp:      TempSrc:      SpO2: 100% 100% 100% 94%   Supplemental O2: Room Air SpO2: 94 %   Physical Exam:  Constitutional: Elderly woman resting comfortably in bed, in no acute distress Cardiovascular: regular rate and rhythm, no m/r/g Pulmonary/Chest: normal work of breathing on room air Abdominal: soft, non-tender, non-distended MSK: normal bulk and tone Neurological: alert & oriented x 3, strength at baseline in bilateral extremities, expressive aphasia Skin: warm and dry Psych: normal affect  There were no vitals filed for this visit.  No intake or output data in the 24 hours ending 05/16/21 1033 Net IO Since Admission: No IO data has been entered for this period [05/16/21 1033]  Pertinent Labs: CBC Latest Ref Rng & Units 05/16/2021 05/15/2021 05/15/2021  WBC 4.0 - 10.5 K/uL 4.9 - 5.1  Hemoglobin 12.0 - 15.0 g/dL 10.1(L) 10.5(L) 10.7(L)  Hematocrit 36.0 - 46.0 % 30.8(L) 31.0(L) 32.1(L)  Platelets 150 - 400 K/uL 328 - 348    CMP Latest Ref Rng & Units 05/16/2021 05/15/2021 05/15/2021  Glucose 70 - 99 mg/dL 88 126(H) 139(H)  BUN 8 - 23 mg/dL 13 16 14   Creatinine 0.44 - 1.00 mg/dL 1.39(H) 1.70(H) 1.54(H)  Sodium 135 - 145 mmol/L 138 141 139  Potassium 3.5 - 5.1 mmol/L 3.7 4.0 4.0  Chloride 98 - 111 mmol/L 112(H) 112(H) 114(H)  CO2 22 - 32 mmol/L 19(L) - 19(L)  Calcium 8.9 - 10.3 mg/dL 9.0 - 8.9  Total Protein 6.5 - 8.1 g/dL - - 6.1(L)  Total Bilirubin 0.3 - 1.2 mg/dL - - 0.7  Alkaline Phos 38 - 126 U/L - - 125  AST 15 - 41 U/L - - 21  ALT 0 -  44 U/L - - 14    Imaging: MR BRAIN WO CONTRAST  Result Date: 05/16/2021 CLINICAL DATA:  Stroke.  Follow-up.  Intracranial stent. EXAM: MRI HEAD WITHOUT CONTRAST TECHNIQUE: Multiplanar, multiecho pulse sequences of the brain and surrounding structures were obtained without intravenous contrast. COMPARISON:  Head CT yesterday.  MRI 05/10/2021. FINDINGS: Brain: Compared to the study of 6 days ago, there is been some extension of the acute infarction in the right posterior temporal region. Volume of infarction is increased by 50%. New punctate infarctions seen at the margins of the previous infarction. Other punctate acute infarctions at the left temporoparietal junction and left posterior frontal cortical brain are unchanged. Few punctate infarctions in the right parietal region are unchanged. Old infarction within the left MCA territory is unchanged, with some hemosiderin deposition. Old small vessel infarctions of the cerebellum as seen previously. No evidence of acute hemorrhage, hydrocephalus or extra-axial collection. Vascular: Major vessels at the base of the brain show flow. Artifact present related to the left MCA stent. Skull and upper cervical spine: Negative Sinuses/Orbits: Clear/normal Other: None IMPRESSION: Extension of the acute infarction in the right MCA territory at the right temporoparietal junction, particularly along the inferior margin of the infarction and with a  few tiny punctate satellite foci additionally present. No evidence of hemorrhage in that region. No change in small areas of acute infarction at the left temporoparietal junction and left posterior frontal region are unchanged. Few punctate acute infarctions in the right parietal lobe are unchanged. Electronically Signed   By: Nelson Chimes M.D.   On: 05/16/2021 09:16   CT HEAD CODE STROKE WO CONTRAST  Result Date: 05/15/2021 CLINICAL DATA:  Code stroke. EXAM: CT HEAD WITHOUT CONTRAST TECHNIQUE: Contiguous axial images were  obtained from the base of the skull through the vertex without intravenous contrast. COMPARISON:  05/11/2021. FINDINGS: Brain: Slightly increased hypodensity in the right posterior temporal lobe and lateral occipital lobe, consistent with evolution of a previously noted acute infarct, without significant increase in size or evidence of acute hemorrhage. Additional more chronic left frontal, temporal, occipital, and parietal encephalomalacia is also unchanged. Remote right cerebellar infarct. No acute hemorrhage, mass, mass effect, or midline shift. No definite new area of infarction. No extra-axial collection or hydrocephalus. Vascular: Redemonstrated left M1 stent.  No hyperdense vessel. Skull: Normal. Negative for fracture or focal lesion. Sinuses/Orbits: No acute finding. Other: The mastoids are well aerated. ASPECTS [facility] Stroke Program Early CT Score) - Ganglionic level infarction (caudate, lentiform nuclei, internal capsule, insula, M1-M3 cortex): 7 - Supraganglionic infarction (M4-M6 cortex): 3 Total score (0-10 with 10 being normal): 10 IMPRESSION: 1. No acute intracranial process. 2. Slightly increased hypodensity in the right posterior temporal lobe and lateral occipital lobe, consistent with evolution of previously noted acute infarct. 3. ASPECTS is 10 Code stroke imaging results were communicated on 05/15/2021 at 9:47 pm to provider [facility] via secure text paging. Electronically Signed   By: Merilyn Baba M.D.   On: 05/15/2021 21:49    Assessment/Plan:   Principal Problem:   Syncope Active Problems:   Asthma   Stage 3b chronic kidney disease (HCC)   CVA (cerebral vascular accident) ([place])   Breast lump   Seizure ([place])   Patient Summary: Courtney Burke is a 65 y.o. 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  Extension of the acute infarction in the right MCA territory at the right temporoparietal junction History of left MCA infarction status post stenting and thrombectomy Patient coming in after a syncopal episode and some possible nausea and vomiting which likely led to a hypotensive event causing extension of the penumbra of the prior CVA. Per neurology she is finished with brilinta and should continue on aspirin 81 as well as her eliquis for afib. Given patient has failed to return to prior baseline will likely need further services/support to be successful at home. - PT/OT eval and treat - aspirin 81 - atorvastatin - avoid low BP - 500 L bolus followed by 100/hr for 12 hours LR - continue keppra 500 BID  Paroxysmal atrial fibrillation In normal sinus rhythm.  Continue Eliquis.  Stage IIIb chronic kidney disease Creatinine 1.5 on admission, baseline ~1.3-1.4.  -Trend BMP -Avoid nephrotoxic agents  Type 2 DM Hemoglobin A1c on 12/8 was 5.5.  Currently not on any diabetic medications.  Blood sugars have been normal since arrival. -CBG monitoring  Chronic normocytic anemia Hemoglobin stable at 10.7.  No signs or symptoms of bleed. -Trend CBC   GERD Home medication includes  omeprazole 40 mg daily, continue Protonix 40 mg.  Breast lumps Patient complains of multiple breast lumps in her right breast, 2 of which were appreciated on exam.  1 was midline slightly closer to the right breast at approximately 3 o'clock position and nontender to palpation.  The second 1 was along the inferior margin of her right breast at the approximate 5 o'clock position.  No skin changes noted.  Patient is due for a breast mammogram however this was not done because she was admitted into the hospital.  Will need to arrange for a diagnostic mammogram.   Hematochezia Patient reports 2 episodes of bright red  blood in her stool over the past few months.  She was scheduled to have a colonoscopy however this was not done due to her being in the hospital.  Will need to reschedule  Diet: CM VTE ppx: eliquis Full code  Courtney Presto, MD Internal Medicine Resident PGY-1 Pager [phone] Please contact the on call pager after 5 pm and on weekends at [phone].

## 2021-05-16 NOTE — Progress Notes (Signed)
EEG complete - results pending 

## 2021-05-16 NOTE — Progress Notes (Signed)
STROKE TEAM PROGRESS NOTE   SUBJECTIVE (INTERVAL HISTORY) Her brother and EEG tech are at the bedside.  Pt on EEG and still has expressive aphasia with word salad, but able to follow most simple commands. Moving all extremities. Per brother, pt impulsive at home and sometimes doing more than she could. Yesterday she had syncope event with feeling hot, sweating and passed out. Lasted about 1.14min and she regained consciousness.    OBJECTIVE Temp:  [98.3 F (36.8 C)] 98.3 F (36.8 C) (12/12 2158) Pulse Rate:  [54-127] 59 (12/13 1215) Resp:  [10-19] 17 (12/13 1215) BP: (98-144)/(60-127) 125/67 (12/13 1215) SpO2:  [94 %-100 %] 100 % (12/13 1215)  Recent Labs  Lab 05/12/21 0330 05/12/21 0852 05/12/21 1156 05/16/21 0924 05/16/21 1220  GLUCAP 64* 85 96 96 76   Recent Labs  Lab 05/10/21 1127 05/10/21 1136 05/10/21 1308 05/11/21 0416 05/12/21 0350 05/15/21 2129 05/15/21 2137 05/16/21 0230  NA 136   < >  --  136 138 139 141 138  K 5.1   < >  --  4.4 4.2 4.0 4.0 3.7  CL 114*   < >  --  114* 111 114* 112* 112*  CO2 17*  --   --  16* 19* 19*  --  19*  GLUCOSE 84   < >  --  82 105* 139* 126* 88  BUN 21   < >  --  16 22 14 16 13   CREATININE 1.82*   < >  --  1.43* 1.78* 1.54* 1.70* 1.39*  CALCIUM 9.2  --   --  9.0 8.8* 8.9  --  9.0  MG  --   --  1.7  --   --   --   --   --    < > = values in this interval not displayed.   Recent Labs  Lab 05/10/21 1127 05/15/21 2129  AST 18 21  ALT 14 14  ALKPHOS 135* 125  BILITOT 0.9 0.7  PROT 6.7 6.1*  ALBUMIN 3.6 3.3*   Recent Labs  Lab 05/10/21 1127 05/10/21 1136 05/11/21 0416 05/12/21 0350 05/15/21 2129 05/15/21 2137 05/16/21 0230  WBC 4.5  --  3.8* 4.7 5.1  --  4.9  NEUTROABS 2.0  --   --   --  3.0  --  2.8  HGB 11.1*   < > 10.2* 10.7* 10.7* 10.5* 10.1*  HCT 33.4*   < > 29.4* 31.2* 32.1* 31.0* 30.8*  MCV 85.4  --  83.1 83.4 85.1  --  84.8  PLT 366  --  316 321 348  --  328   < > = values in this interval not displayed.    No results for input(s): CKTOTAL, CKMB, CKMBINDEX, TROPONINI in the last 168 hours. Recent Labs    05/15/21 2129  LABPROT 16.4*  INR 1.3*   No results for input(s): COLORURINE, LABSPEC, PHURINE, GLUCOSEU, HGBUR, BILIRUBINUR, KETONESUR, PROTEINUR, UROBILINOGEN, NITRITE, LEUKOCYTESUR in the last 72 hours.  Invalid input(s): APPERANCEUR     Component Value Date/Time   CHOL 82 05/11/2021 0416   TRIG 62 05/11/2021 0416   HDL 49 05/11/2021 0416   CHOLHDL 1.7 05/11/2021 0416   VLDL 12 05/11/2021 0416   LDLCALC 21 05/11/2021 0416   Lab Results  Component Value Date   HGBA1C 5.5 05/11/2021      Component Value Date/Time   LABOPIA NONE DETECTED 06/30/2020 0825   COCAINSCRNUR NONE DETECTED 06/30/2020 0825   LABBENZ NONE DETECTED 06/30/2020  0825   AMPHETMU NONE DETECTED 06/30/2020 0825   THCU NONE DETECTED 06/30/2020 0825   LABBARB NONE DETECTED 06/30/2020 0825    No results for input(s): ETH in the last 168 hours.  I have personally reviewed the radiological images below and agree with the radiology interpretations.  CT HEAD WO CONTRAST (5MM)  Result Date: 05/11/2021 CLINICAL DATA:  Follow-up examination for acute stroke. EXAM: CT HEAD WITHOUT CONTRAST TECHNIQUE: Contiguous axial images were obtained from the base of the skull through the vertex without intravenous contrast. COMPARISON:  Previous CT and MRI from 05/10/2021. FINDINGS: Brain: Previously identified acute ischemic infarct involving the right temporal occipital junction again seen, relatively stable in size and morphology as compared to previous MRI. No evidence for hemorrhagic transformation or other complication. No significant regional mass effect. No other new or acute large vessel territory infarct. Chronic left MCA distribution infarcts again noted. Small remote right cerebellar infarct noted as well. No acute intracranial hemorrhage. No mass lesion or new midline shift. No hydrocephalus or extra-axial fluid  collection. Vascular: Vascular stent in place within the left M1 segment. No hyperdense vessel. Skull: Scalp soft tissues and calvarium demonstrate no new abnormality. Sinuses/Orbits: Globes and orbital soft tissues demonstrate no acute finding. Paranasal sinuses mastoid air cells remain clear. Other: None. IMPRESSION: 1. No significant interval change in size and morphology of acute ischemic infarct involving the right temporoccipital junction. No evidence for hemorrhagic transformation or other complication. 2. No other new acute intracranial abnormality. Electronically Signed   By: Jeannine Boga M.D.   On: 05/11/2021 02:56   CT HEAD WO CONTRAST  Result Date: 05/10/2021 CLINICAL DATA:  Neurological deficit, acute, stroke suspected. Aphasia and confusion. EXAM: CT HEAD WITHOUT CONTRAST TECHNIQUE: Contiguous axial images were obtained from the base of the skull through the vertex without intravenous contrast. COMPARISON:  02/07/2021 FINDINGS: Brain: Previous left middle cerebral artery stent with old infarction within the left middle cerebral artery territory as seen previously. There is newly seen loss of gray-white differentiation at the right temporoparietal junction. There could be acute infarction in this location. No sign of hemorrhage or mass effect. No hydrocephalus or extra-axial fluid collection. Vascular: Left MCA stent as noted above. Skull: Negative Sinuses/Orbits: Clear/normal Other: None IMPRESSION: Old left MCA territory strokes. Newly seen loss of gray-white differentiation at the right temporoparietal junction suggesting acute infarction in that region today. No hemorrhage or mass effect. Electronically Signed   By: Nelson Chimes M.D.   On: 05/10/2021 12:43   MR ANGIO HEAD WO CONTRAST  Result Date: 05/10/2021 CLINICAL DATA:  Initial evaluation for neuro deficit, stroke suspected. Abnormal CT. EXAM: MRI HEAD WITHOUT CONTRAST MRA HEAD WITHOUT CONTRAST MRA NECK WITHOUT CONTRAST  TECHNIQUE: Multiplanar, multiecho pulse sequences of the brain and surrounding structures were obtained without intravenous contrast. Angiographic images of the Circle of Willis were obtained using MRA technique without intravenous contrast. Angiographic images of the neck were obtained using MRA technique without intravenous contrast. Carotid stenosis measurements (when applicable) are obtained utilizing NASCET criteria, using the distal internal carotid diameter as the denominator. COMPARISON:  Prior head CT from earlier the same day as well as earlier studies. FINDINGS: MRI HEAD FINDINGS Brain: Cerebral volume within normal limits for age. Chronic encephalomalacia and gliosis involving the left cerebral hemisphere consistent with chronic left MCA distribution infarcts. Scattered areas of chronic hemosiderin staining and/or laminar necrosis noted throughout these areas. Small remote right cerebellar infarct noted. Underlying mild chronic small vessel ischemic disease. Abnormal restricted diffusion  involving the right temporal occipital junction consistent with an acute ischemic infarct, posterior right MCA distribution (series 5, image 72). Finding corresponds with abnormality on prior CT. No associated hemorrhage or mass effect. Few additional scattered small volume foci of diffusion abnormality involving the left frontal lobe (series 5, image 84) and left periatrial region (series 5, image 73) favored to be artifactual nature related underlying laminar necrosis and/or chronic blood products. Otherwise, gray-white matter differentiation maintained. No other areas of acute infarction. No acute intracranial hemorrhage. 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 No other mass lesion. Mild right left deviation related to chronic left cerebral volume loss. No hydrocephalus or extra-axial fluid collection. Pituitary gland and suprasellar region normal. Midline structures intact. Vascular: Major intracranial vascular  flow voids maintained. Vascular stent in place within the left M1 segment. Skull and upper cervical spine: Craniocervical junction within normal limits. Bone marrow signal intensity diffusely decreased on T1 weighted sequence, nonspecific, but most commonly related to anemia, smoking or obesity. No focal marrow replacing lesion. No scalp soft tissue abnormality. Sinuses/Orbits: Globes and orbital soft tissues within normal limits. Paranasal sinuses are clear. No mastoid effusion. Other: None. MRA HEAD FINDINGS ANTERIOR CIRCULATION: Visualized distal cervical segments of the internal carotid arteries are patent with antegrade flow. Petrous segments patent bilaterally. Cavernous and supraclinoid left ICA widely patent. Short-segment approximate 50% stenosis at the supraclinoid right ICA, grossly similar to prior arteriogram (series 1027, image 10). Right M1 segment remains patent. Normal right MCA bifurcation. Probable severe proximal right M2 stenosis with associated flow gap (series 1033, image 15). This is similar as compared to prior MRA. Right MCA branches remain perfused distally, although demonstrates small vessel atheromatous irregularity. Vascular stent in place within the left M1 segment. Flow through the stent itself not evaluated by MRA. Probable short-segment severe left M2 stenosis, seen on prior MRA (series 5, image 113). Left MCA branches otherwise patent distally. A1 segments patent bilaterally. Normal anterior communicating artery complex. Anterior cerebral arteries remain widely patent. POSTERIOR CIRCULATION: Both vertebral arteries remain widely patent to the vertebrobasilar junction. Right PICA origin patent. Left PICA not seen. Basilar patent to its distal aspect without stenosis. Superior cerebellar arteries patent bilaterally. Both PCA supplied via hypoplastic P1 segments and robust bilateral posterior communicating arteries. PCAs remain widely patent to their distal aspects. No aneurysm. MRA  NECK FINDINGS AORTIC ARCH: Visualized aortic arch normal caliber. Bovine branching pattern noted at the aortic arch. No hemodynamically significant stenosis about the origin of the great vessels. RIGHT CAROTID SYSTEM: Right common and internal carotid arteries patent without stenosis or evidence for dissection. Mild for age atheromatous irregularity about the right bifurcation without stenosis. LEFT CAROTID SYSTEM: Left common and internal carotid arteries patent without stenosis or evidence for dissection. Mild for age atheromatous irregularity about the left bifurcation without stenosis. VERTEBRAL ARTERIES: Both vertebral arteries arise from subclavian arteries. Vertebral arteries are largely codominant and widely patent without stenosis or evidence for dissection. IMPRESSION: MRI HEAD: 1. Acute ischemic nonhemorrhagic infarct involving the right temporoccipital junction, posterior right MCA distribution. 2. Chronic left MCA territory infarcts, stable from prior. 3. Additional small remote right cerebellar infarct. 4. Underlying age-related cerebral atrophy with mild chronic small vessel ischemic disease. MRA HEAD: 1. Negative intracranial MRA for large vessel occlusion. 2. Approximate 50% stenosis at the supraclinoid right ICA, with probable additional short-segment severe proximal right M2 stenosis. 3. Vascular stent in place within the left M1 segment. Flow through the stent itself not evaluated by MRA, although patent  flow seen distal to the stent. 4. Short-segment severe left M2 stenosis, similar to previous. 5. Wide patency of the posterior circulation. MRA NECK: Wide patency of both carotid artery systems and vertebral arteries within the neck. No hemodynamically significant stenosis or other acute vascular abnormality. Electronically Signed   By: Jeannine Boga M.D.   On: 05/10/2021 19:03   MR ANGIO NECK WO CONTRAST  Result Date: 05/10/2021 CLINICAL DATA:  Initial evaluation for neuro deficit,  stroke suspected. Abnormal CT. EXAM: MRI HEAD WITHOUT CONTRAST MRA HEAD WITHOUT CONTRAST MRA NECK WITHOUT CONTRAST TECHNIQUE: Multiplanar, multiecho pulse sequences of the brain and surrounding structures were obtained without intravenous contrast. Angiographic images of the Circle of Willis were obtained using MRA technique without intravenous contrast. Angiographic images of the neck were obtained using MRA technique without intravenous contrast. Carotid stenosis measurements (when applicable) are obtained utilizing NASCET criteria, using the distal internal carotid diameter as the denominator. COMPARISON:  Prior head CT from earlier the same day as well as earlier studies. FINDINGS: MRI HEAD FINDINGS Brain: Cerebral volume within normal limits for age. Chronic encephalomalacia and gliosis involving the left cerebral hemisphere consistent with chronic left MCA distribution infarcts. Scattered areas of chronic hemosiderin staining and/or laminar necrosis noted throughout these areas. Small remote right cerebellar infarct noted. Underlying mild chronic small vessel ischemic disease. Abnormal restricted diffusion involving the right temporal occipital junction consistent with an acute ischemic infarct, posterior right MCA distribution (series 5, image 72). Finding corresponds with abnormality on prior CT. No associated hemorrhage or mass effect. Few additional scattered small volume foci of diffusion abnormality involving the left frontal lobe (series 5, image 84) and left periatrial region (series 5, image 73) favored to be artifactual nature related underlying laminar necrosis and/or chronic blood products. Otherwise, gray-white matter differentiation maintained. No other areas of acute infarction. No acute intracranial hemorrhage. 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 No other mass lesion. Mild right left deviation related to chronic left cerebral volume loss. No hydrocephalus or extra-axial fluid collection.  Pituitary gland and suprasellar region normal. Midline structures intact. Vascular: Major intracranial vascular flow voids maintained. Vascular stent in place within the left M1 segment. Skull and upper cervical spine: Craniocervical junction within normal limits. Bone marrow signal intensity diffusely decreased on T1 weighted sequence, nonspecific, but most commonly related to anemia, smoking or obesity. No focal marrow replacing lesion. No scalp soft tissue abnormality. Sinuses/Orbits: Globes and orbital soft tissues within normal limits. Paranasal sinuses are clear. No mastoid effusion. Other: None. MRA HEAD FINDINGS ANTERIOR CIRCULATION: Visualized distal cervical segments of the internal carotid arteries are patent with antegrade flow. Petrous segments patent bilaterally. Cavernous and supraclinoid left ICA widely patent. Short-segment approximate 50% stenosis at the supraclinoid right ICA, grossly similar to prior arteriogram (series 1027, image 10). Right M1 segment remains patent. Normal right MCA bifurcation. Probable severe proximal right M2 stenosis with associated flow gap (series 1033, image 15). This is similar as compared to prior MRA. Right MCA branches remain perfused distally, although demonstrates small vessel atheromatous irregularity. Vascular stent in place within the left M1 segment. Flow through the stent itself not evaluated by MRA. Probable short-segment severe left M2 stenosis, seen on prior MRA (series 5, image 113). Left MCA branches otherwise patent distally. A1 segments patent bilaterally. Normal anterior communicating artery complex. Anterior cerebral arteries remain widely patent. POSTERIOR CIRCULATION: Both vertebral arteries remain widely patent to the vertebrobasilar junction. Right PICA origin patent. Left PICA not seen. Basilar patent to its distal  aspect without stenosis. Superior cerebellar arteries patent bilaterally. Both PCA supplied via hypoplastic P1 segments and robust  bilateral posterior communicating arteries. PCAs remain widely patent to their distal aspects. No aneurysm. MRA NECK FINDINGS AORTIC ARCH: Visualized aortic arch normal caliber. Bovine branching pattern noted at the aortic arch. No hemodynamically significant stenosis about the origin of the great vessels. RIGHT CAROTID SYSTEM: Right common and internal carotid arteries patent without stenosis or evidence for dissection. Mild for age atheromatous irregularity about the right bifurcation without stenosis. LEFT CAROTID SYSTEM: Left common and internal carotid arteries patent without stenosis or evidence for dissection. Mild for age atheromatous irregularity about the left bifurcation without stenosis. VERTEBRAL ARTERIES: Both vertebral arteries arise from subclavian arteries. Vertebral arteries are largely codominant and widely patent without stenosis or evidence for dissection. IMPRESSION: MRI HEAD: 1. Acute ischemic nonhemorrhagic infarct involving the right temporoccipital junction, posterior right MCA distribution. 2. Chronic left MCA territory infarcts, stable from prior. 3. Additional small remote right cerebellar infarct. 4. Underlying age-related cerebral atrophy with mild chronic small vessel ischemic disease. MRA HEAD: 1. Negative intracranial MRA for large vessel occlusion. 2. Approximate 50% stenosis at the supraclinoid right ICA, with probable additional short-segment severe proximal right M2 stenosis. 3. Vascular stent in place within the left M1 segment. Flow through the stent itself not evaluated by MRA, although patent flow seen distal to the stent. 4. Short-segment severe left M2 stenosis, similar to previous. 5. Wide patency of the posterior circulation. MRA NECK: Wide patency of both carotid artery systems and vertebral arteries within the neck. No hemodynamically significant stenosis or other acute vascular abnormality. Electronically Signed   By: Jeannine Boga M.D.   On: 05/10/2021 19:03    MR BRAIN WO CONTRAST  Result Date: 05/16/2021 CLINICAL DATA:  Stroke.  Follow-up.  Intracranial stent. EXAM: MRI HEAD WITHOUT CONTRAST TECHNIQUE: Multiplanar, multiecho pulse sequences of the brain and surrounding structures were obtained without intravenous contrast. COMPARISON:  Head CT yesterday.  MRI 05/10/2021. FINDINGS: Brain: Compared to the study of 6 days ago, there is been some extension of the acute infarction in the right posterior temporal region. Volume of infarction is increased by 50%. New punctate infarctions seen at the margins of the previous infarction. Other punctate acute infarctions at the left temporoparietal junction and left posterior frontal cortical brain are unchanged. Few punctate infarctions in the right parietal region are unchanged. Old infarction within the left MCA territory is unchanged, with some hemosiderin deposition. Old small vessel infarctions of the cerebellum as seen previously. No evidence of acute hemorrhage, hydrocephalus or extra-axial collection. Vascular: Major vessels at the base of the brain show flow. Artifact present related to the left MCA stent. 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 No evidence of hemorrhage in that region. No change in small areas of acute infarction at the left temporoparietal junction and left posterior frontal region are unchanged. Few punctate acute infarctions in the right parietal lobe are unchanged. Electronically Signed   By: Nelson Chimes M.D.   On: 05/16/2021 09:16   MR BRAIN WO CONTRAST  Result Date: 05/10/2021 CLINICAL DATA:  Initial evaluation for neuro deficit, stroke suspected. Abnormal CT. EXAM: MRI HEAD WITHOUT CONTRAST MRA HEAD WITHOUT  CONTRAST MRA NECK WITHOUT CONTRAST TECHNIQUE: Multiplanar, multiecho pulse sequences of  the brain and surrounding structures were obtained without intravenous contrast. Angiographic images of the Circle of Willis were obtained using MRA technique without intravenous contrast. Angiographic images of the neck were obtained using MRA technique without intravenous contrast. Carotid stenosis measurements (when applicable) are obtained utilizing NASCET criteria, using the distal internal carotid diameter as the denominator. COMPARISON:  Prior head CT from earlier the same day as well as earlier studies. FINDINGS: MRI HEAD FINDINGS Brain: Cerebral volume within normal limits for age. Chronic encephalomalacia and gliosis involving the left cerebral hemisphere consistent with chronic left MCA distribution infarcts. Scattered areas of chronic hemosiderin staining and/or laminar necrosis noted throughout these areas. Small remote right cerebellar infarct noted. Underlying mild chronic small vessel ischemic disease. Abnormal restricted diffusion involving the right temporal occipital junction consistent with an acute ischemic infarct, posterior right MCA distribution (series 5, image 72). Finding corresponds with abnormality on prior CT. No associated hemorrhage or mass effect. Few additional scattered small volume foci of diffusion abnormality involving the left frontal lobe (series 5, image 84) and left periatrial region (series 5, image 73) favored to be artifactual nature related underlying laminar necrosis and/or chronic blood products. Otherwise, gray-white matter differentiation maintained. No other areas of acute infarction. No acute intracranial hemorrhage. 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 No other mass lesion. Mild right left deviation related to chronic left cerebral volume loss. No hydrocephalus or extra-axial fluid collection. Pituitary gland and suprasellar region normal. Midline structures intact.  Vascular: Major intracranial vascular flow voids maintained. Vascular stent in place within the left M1 segment. Skull and upper cervical spine: Craniocervical junction within normal limits. Bone marrow signal intensity diffusely decreased on T1 weighted sequence, nonspecific, but most commonly related to anemia, smoking or obesity. No focal marrow replacing lesion. No scalp soft tissue abnormality. Sinuses/Orbits: Globes and orbital soft tissues within normal limits. Paranasal sinuses are clear. No mastoid effusion. Other: None. MRA HEAD FINDINGS ANTERIOR CIRCULATION: Visualized distal cervical segments of the internal carotid arteries are patent with antegrade flow. Petrous segments patent bilaterally. Cavernous and supraclinoid left ICA widely patent. Short-segment approximate 50% stenosis at the supraclinoid right ICA, grossly similar to prior arteriogram (series 1027, image 10). Right M1 segment remains patent. Normal right MCA bifurcation. Probable severe proximal right M2 stenosis with associated flow gap (series 1033, image 15). This is similar as compared to prior MRA. Right MCA branches remain perfused distally, although demonstrates small vessel atheromatous irregularity. Vascular stent in place within the left M1 segment. Flow through the stent itself not evaluated by MRA. Probable short-segment severe left M2 stenosis, seen on prior MRA (series 5, image 113). Left MCA branches otherwise patent distally. A1 segments patent bilaterally. Normal anterior communicating artery complex. Anterior cerebral arteries remain widely patent. POSTERIOR CIRCULATION: Both vertebral arteries remain widely patent to the vertebrobasilar junction. Right PICA origin patent. Left PICA not seen. Basilar patent to its distal aspect without stenosis. Superior cerebellar arteries patent bilaterally. Both PCA supplied via hypoplastic P1 segments and robust bilateral posterior communicating arteries. PCAs remain widely patent to  their distal aspects. No aneurysm. MRA NECK FINDINGS AORTIC ARCH: Visualized aortic arch normal caliber. Bovine branching pattern noted at the aortic arch. No hemodynamically significant stenosis about the origin of the great vessels. RIGHT CAROTID SYSTEM: Right common and internal carotid arteries patent without stenosis or evidence for dissection. Mild for age atheromatous irregularity about the right bifurcation without stenosis. LEFT CAROTID SYSTEM: Left common and internal carotid arteries patent without stenosis or evidence for  dissection. Mild for age atheromatous irregularity about the left bifurcation without stenosis. VERTEBRAL ARTERIES: Both vertebral arteries arise from subclavian arteries. Vertebral arteries are largely codominant and widely patent without stenosis or evidence for dissection. IMPRESSION: MRI HEAD: 1. Acute ischemic nonhemorrhagic infarct involving the right temporoccipital junction, posterior right MCA distribution. 2. Chronic left MCA territory infarcts, stable from prior. 3. Additional small remote right cerebellar infarct. 4. Underlying age-related cerebral atrophy with mild chronic small vessel ischemic disease. MRA HEAD: 1. Negative intracranial MRA for large vessel occlusion. 2. Approximate 50% stenosis at the supraclinoid right ICA, with probable additional short-segment severe proximal right M2 stenosis. 3. Vascular stent in place within the left M1 segment. Flow through the stent itself not evaluated by MRA, although patent flow seen distal to the stent. 4. Short-segment severe left M2 stenosis, similar to previous. 5. Wide patency of the posterior circulation. MRA NECK: Wide patency of both carotid artery systems and vertebral arteries within the neck. No hemodynamically significant stenosis or other acute vascular abnormality. Electronically Signed   By: Jeannine Boga M.D.   On: 05/10/2021 19:03   EEG adult  Result Date: 05/16/2021 Lora Havens, MD      05/16/2021 12:54 PM Patient Name: Nayzeth Altman MRN: [id number] Epilepsy Attending: Lora Havens Referring Physician/Provider: Dr Amie [place] Date: 05/16/2021 Duration: 23.25 mins Patient history:  65 yo F with hx recent CVA presenting aphasia, headache s/p syncopal event. EEG to evaluate for seizure Level of alertness: Awake AEDs during EEG study: LEV Technical aspects: This EEG study was done with scalp electrodes positioned according to the 10-20 International system of electrode placement. Electrical activity was acquired at a sampling rate of 500Hz  and reviewed with a high frequency filter of 70Hz  and a low frequency filter of 1Hz . EEG data were recorded continuously and digitally stored. Description: The posterior dominant rhythm consists of 9Hz  activity of moderate voltage (25-35 uV) seen predominantly in posterior head regions, symmetric and reactive to eye opening and eye closing.  EEG showed intermittent independent left and right temporal 2 to 3 Hz delta slowing.  Hyperventilation and photic stimulation were not performed.   ABNORMALITY -Intermittent slow, left and right temporal region IMPRESSION: This study is suggestive of cortical dysfunction arising from left and right temporal region, nonspecific etiology but most likely secondary to underlying strokes. No seizures or epileptiform discharges were seen throughout the recording. Lora Havens   EEG adult  Result Date: 05/11/2021 Lora Havens, MD     05/11/2021  1:21 PM Patient Name: Tiea Manninen MRN: [id number] Epilepsy Attending: Lora Havens Referring Physician/Provider: Dr Lalla Brothers Date: 05/11/2021 Duration: 25.37 mins Patient history: 65 year old female with history of seizures and temporoparietal CVA.  EEG to evaluate for seizure. Level of alertness: Awake, asleep AEDs during EEG study: Keppra Technical aspects: This EEG study was done with scalp electrodes positioned according to the 10-20 International  system of electrode placement. Electrical activity was acquired at a sampling rate of 500Hz  and reviewed with a high frequency filter of 70Hz  and a low frequency filter of 1Hz . EEG data were recorded continuously and digitally stored. Description: The posterior dominant rhythm consists of 8-9Hz  activity of moderate voltage (25-35 uV) seen predominantly in posterior head regions, symmetric and reactive to eye opening and eye closing. Sleep was characterized by vertex waves, sleep spindles (12 to 14 Hz), maximal frontocentral region.  EEG showed intermittent generalized  3 to 6 Hz theta-delta slowing. Hyperventilation and photic stimulation were not  performed.   ABNORMALITY - Intermittent slow, generalized IMPRESSION: This study is suggestive of mild diffuse encephalopathy, nonspecific etiology. No seizures or epileptiform discharges were seen throughout the recording. Lora Havens   ECHOCARDIOGRAM COMPLETE  Result Date: 05/11/2021    ECHOCARDIOGRAM REPORT   Patient Name:   [facility] Date of Exam: 05/11/2021 Medical Rec #:  [id number]            Height:       62.0 in Accession #:    [id number]           Weight:       157.0 lb Date of Birth:  November 18, 1955            BSA:          1.725 m Patient Age:    6 years             BP:           123/68 mmHg Patient Gender: F                    HR:           62 bpm. Exam Location:  Inpatient Procedure: 2D Echo, 3D Echo, Cardiac Doppler and Color Doppler Indications:    Stroke  History:        Patient has no prior history of Echocardiogram examinations.                 Signs/Symptoms:Altered Mental Status; Risk Factors:Hypertension,                 Diabetes and Dyslipidemia.  Sonographer:    Roseanna Rainbow RDCS Referring Phys: [id number] [facility] A GRAY  Sonographer Comments: Patient could not follow directions. IMPRESSIONS  1. Left ventricular ejection fraction, by estimation, is 55 to 60%. Left ventricular ejection fraction by 3D volume is 58 %. The left ventricle has  normal function. The left ventricle has no regional wall motion abnormalities. There is moderate asymmetric left ventricular hypertrophy of the basal-septal segment. Left ventricular diastolic parameters are consistent with Grade I diastolic dysfunction (impaired relaxation).  2. Right ventricular systolic function is normal. The right ventricular size is normal. There is mildly elevated pulmonary artery systolic pressure. The estimated right ventricular systolic pressure is 12.7 mmHg.  3. The mitral valve is degenerative. Trivial mitral valve regurgitation. No evidence of mitral stenosis.  4. Tricuspid valve regurgitation is mild to moderate.  5. The aortic valve is tricuspid. There is mild calcification of the aortic valve. There is mild thickening of the aortic valve. Aortic valve regurgitation is not visualized. Aortic valve sclerosis/calcification is present, without any evidence of aortic stenosis.  6. The inferior vena cava is normal in size with <50% respiratory variability, suggesting right atrial pressure of 8 mmHg. Comparison(s): No prior Echocardiogram. Conclusion(s)/Recommendation(s): No intracardiac source of embolism detected on this transthoracic study. Consider a transesophageal echocardiogram to exclude cardiac source of embolism if clinically indicated. FINDINGS  Left Ventricle: Left ventricular ejection fraction, by estimation, is 55 to 60%. Left ventricular ejection fraction by 3D volume is 58 %. The left ventricle has normal function. The left ventricle has no regional wall motion abnormalities. The left ventricular internal cavity size was normal in size. There is moderate asymmetric left ventricular hypertrophy of the basal-septal segment. Left ventricular diastolic parameters are consistent with Grade I diastolic dysfunction (impaired relaxation). Right Ventricle: The right ventricular size is normal. No increase in right ventricular wall thickness. Right ventricular systolic  function is  normal. There is mildly elevated pulmonary artery systolic pressure. The tricuspid regurgitant velocity is 2.65  m/s, and with an assumed right atrial pressure of 8 mmHg, the estimated right ventricular systolic pressure is 24.0 mmHg. Left Atrium: Left atrial size was normal in size. Right Atrium: Right atrial size was normal in size. Pericardium: There is no evidence of pericardial effusion. Mitral Valve: The mitral valve is degenerative in appearance. There is mild thickening of the mitral valve leaflet(s). There is mild calcification of the mitral valve leaflet(s). Mild mitral annular calcification. Trivial mitral valve regurgitation. No evidence of mitral valve stenosis. Tricuspid Valve: The tricuspid valve is normal in structure. Tricuspid valve regurgitation is mild to moderate. Aortic Valve: The aortic valve is tricuspid. There is mild calcification of the aortic valve. There is mild thickening of the aortic valve. Aortic valve regurgitation is not visualized. Aortic valve sclerosis/calcification is present, without any evidence of aortic stenosis. Pulmonic Valve: The pulmonic valve was normal in structure. Pulmonic valve regurgitation is trivial. Aorta: The aortic root and ascending aorta are structurally normal, with no evidence of dilitation. Venous: The inferior vena cava is normal in size with less than 50% respiratory variability, suggesting right atrial pressure of 8 mmHg. IAS/Shunts: The interatrial septum is aneurysmal. No atrial level shunt detected by color flow Doppler.  LEFT VENTRICLE PLAX 2D LVIDd:         3.90 cm         Diastology LVIDs:         2.50 cm         LV e' medial:    7.18 cm/s LV PW:         1.00 cm         LV E/e' medial:  11.9 LV IVS:        1.00 cm         LV e' lateral:   14.10 cm/s LVOT diam:     2.00 cm         LV E/e' lateral: 6.1 LV SV:         84 LV SV Index:   49 LVOT Area:     3.14 cm        3D Volume EF                                LV 3D EF:    Left                                              ventricul LV Volumes (MOD)                            ar LV vol d, MOD    73.8 ml                    ejection A2C:                                        fraction LV vol d, MOD    61.8 ml                    by  3D A4C:                                        volume is LV vol s, MOD    31.3 ml                    58 %. A2C: LV vol s, MOD    25.1 ml A4C:                           3D Volume EF: LV SV MOD A2C:   42.5 ml       3D EF:        58 % LV SV MOD A4C:   61.8 ml       LV EDV:       113 ml LV SV MOD BP:    41.0 ml       LV ESV:       48 ml                                LV SV:        65 ml RIGHT VENTRICLE            IVC RV S prime:     8.81 cm/s  IVC diam: 1.90 cm TAPSE (M-mode): 1.7 cm LEFT ATRIUM             Index        RIGHT ATRIUM           Index LA diam:        3.60 cm 2.09 cm/m   RA Area:     13.80 cm LA Vol (A2C):   36.5 ml 21.16 ml/m  RA Volume:   35.00 ml  20.29 ml/m LA Vol (A4C):   24.2 ml 14.03 ml/m LA Biplane Vol: 30.6 ml 17.74 ml/m  AORTIC VALVE LVOT Vmax:   124.00 cm/s LVOT Vmean:  78.600 cm/s LVOT VTI:    0.267 m  AORTA Ao Root diam: 2.70 cm Ao Asc diam:  3.15 cm MITRAL VALVE                TRICUSPID VALVE MV Area (PHT): 2.95 cm     TR Peak grad:   28.1 mmHg MV Decel Time: 257 msec     TR Vmax:        265.00 cm/s MV E velocity: 85.40 cm/s MV A velocity: 115.00 cm/s  SHUNTS MV E/A ratio:  0.74         Systemic VTI:  0.27 m                             Systemic Diam: 2.00 cm Gwyndolyn Kaufman MD Electronically signed by Gwyndolyn Kaufman MD Signature Date/Time: 05/11/2021/2:44:05 PM    Final    CT HEAD CODE STROKE WO CONTRAST  Result Date: 05/15/2021 CLINICAL DATA:  Code stroke. EXAM: CT HEAD WITHOUT CONTRAST TECHNIQUE: Contiguous axial images were obtained from the base of the skull through the vertex without intravenous contrast. COMPARISON:  05/11/2021. FINDINGS: Brain: Slightly increased hypodensity in the right posterior temporal lobe and lateral occipital lobe,  consistent with evolution of a previously noted acute infarct, without significant increase in size or evidence of acute  hemorrhage. Additional more chronic left frontal, temporal, occipital, and parietal encephalomalacia is also unchanged. Remote right cerebellar infarct. No acute hemorrhage, mass, mass effect, or midline shift. No definite new area of infarction. No extra-axial collection or hydrocephalus. Vascular: Redemonstrated left M1 stent.  No hyperdense vessel. Skull: Normal. Negative for fracture or focal lesion. Sinuses/Orbits: No acute finding. Other: The mastoids are well aerated. ASPECTS [facility] Stroke Program Early CT Score) - Ganglionic level infarction (caudate, lentiform nuclei, internal capsule, insula, M1-M3 cortex): 7 - Supraganglionic infarction (M4-M6 cortex): 3 Total score (0-10 with 10 being normal): 10 IMPRESSION: 1. No acute intracranial process. 2. Slightly increased hypodensity in the right posterior temporal lobe and lateral occipital lobe, consistent with evolution of previously noted acute infarct. 3. ASPECTS is 10 Code stroke imaging results were communicated on 05/15/2021 at 9:47 pm to provider OSIAS via secure text paging. Electronically Signed   By: Merilyn Baba M.D.   On: 05/15/2021 21:49     PHYSICAL EXAM  Temp:  [98.3 F (36.8 C)] 98.3 F (36.8 C) (12/12 2158) Pulse Rate:  [54-127] 59 (12/13 1215) Resp:  [10-19] 17 (12/13 1215) BP: (98-144)/(60-127) 125/67 (12/13 1215) SpO2:  [94 %-100 %] 100 % (12/13 1215)  General - Well nourished, well developed, in no apparent distress, mildly restless with sweating.  Ophthalmologic - fundi not visualized due to noncooperation.  Cardiovascular - Regular rhythm and rate.  Neuro - awake, alert, eyes open, mildly restless with sweating but able to re-direct. Expressive aphasia with word salad, however, able to follow most simple peripheral and midline commands. No gaze palsy, tracking bilaterally, blinking to visual threat  bilaterally, PERRL. No significant facial droop. Tongue midline. Bilateral UEs 5/5, no drift. Bilaterally LEs 5/5, no drift. Sensation, coordination not cooperative and gait not tested.   ASSESSMENT/PLAN Ms. Gulianna Hornsby is a 65 y.o. female with history of hypertension, diabetes, CKD, obesity status post gastric bypass in 05/2020, stroke in 06/2020, seizure activity due to stroke on keppra admitted for worsening aphasia post syncope event.    Stroke:  right ICA infarct extended from last admission, likely hypotension / syncope in the setting of right tICA and M2 stenosis.  CT head - Slightly increased hypodensity in the right posterior temporal lobe and lateral occipital lobe, consistent with evolution of previously noted acute infarct. MRI  Extension of the acute infarction in the right MCA territory at the right temporoparietal junction, particularly along the inferior margin of the infarction and with a few tiny punctate satellite foci additionally present. MRA 12/7 - Approximate 50% stenosis at the supraclinoid right ICA, with probable additional short-segment severe proximal right M2 stenosis.  EEG pending eliquis for VTE prophylaxis Brilinta (ticagrelor) 90 mg bid and Eliquis (apixaban) daily prior to admission, now on aspirin 81 mg daily and Eliquis (apixaban) daily.  Ongoing aggressive stroke risk factor management Therapy recommendations:  pending  Disposition:  pending  Hx of stroke and seizure 06/2020, patient admitted for confusion and seizure-like activity, left gaze and right-sided weakness.  CT showed left MCA early ischemic changes.  CTA head neck showed left M1 occlusion, severe right ACA origin and moderate right M2 stenosis.  Status post left MCA thrombectomy and MCA stenting.  EF 60 to 65%, DVT negative, loop recorder placed.  EEG showed intermittent delta activity left frontal region, put on Keppra.  A1c 5.4, LDL 68, discharged with aspirin Brilinta and Lipitor 80.    Over time, she follow-up with [facility] and also speech therapy.  Per brother,  patient recovered well, walking independently, able to communicate without difficulty.  Loop recorder found to have A. fib in 10/2020, she was put on Eliquis.   Seizure activity 02/2021, restarted Keppra. 05/10/2021 admitted for slurred speech, confusion, and headache.  CT head old left MCA infarct however new right temporal parietal infarct.  MRI confirmed right MCA temporal occipital junction moderate infarct.  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 EF 55-60%, LDL 21 and A1C 5.5. Discharged with eliquis and brilinta.  Syncope  Per brother, pt had syncope episode at home with LOC 1.64min Now back to baseline Etiology unclear Recommend syncope work up Recommend orthostatic vitals  Hypotension Hx of hypertension  Stable now with episode of low BP 90s On IVF Orthostatic vital pending Long term BP goal normotensive TED hose  Hyperlipidemia Home meds:  lipitor 80  LDL 21, goal < 70 Now decrease lipitor to 40 Continue statin at discharge  Other Stroke Risk Factors Advanced age obesity status post gastric bypass in 05/2020  Other Active Problems CKD III a - Cre 1.39  Hospital day # 0    Rosalin Hawking, MD PhD Stroke Neurology 05/16/2021 2:05 PM    To contact Stroke Continuity provider, please refer to [email or web address] After hours, contact General Neurology

## 2021-05-16 NOTE — Progress Notes (Signed)
Patietn unavailable for EEG.  PT in room.  Will r re-attempt at 1040.

## 2021-05-16 NOTE — ED Notes (Signed)
Called nurse on 3W to initiate report. Will review chart

## 2021-05-16 NOTE — Evaluation (Signed)
Physical Therapy Evaluation Patient Details Name: Courtney Burke MRN: [id number] DOB: [birth year] Today's Date: 05/16/2021  History of Present Illness  Pt is a 65 y/o female admitted 12/12 secondary to syncope. 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 PMH includes CVA, CKD, asthma, HTN, DM, and seizures.  Clinical Impression  Pt admitted secondary to problem above with deficits below. Pt with functional weakness noted in RLE, but per brother this is close to baseline. Required min A for transfers and min guard A for gait within the room. Continues to present with expressive and receptive deficits and per brother has been working with SLP. Recommending outpatient PT to address current deficits. Will continue to follow acutely.        Recommendations for follow up therapy are one component of a multi-disciplinary discharge planning process, led by the attending physician.  Recommendations may be updated based on patient status, additional functional criteria and insurance authorization.  Follow Up Recommendations Outpatient PT    Assistance Recommended at Discharge Frequent or constant Supervision/Assistance  Functional Status Assessment Patient has had a recent decline in their functional status and demonstrates the ability to make significant improvements in function in a reasonable and predictable amount of time.  Equipment Recommendations  None recommended by PT    Recommendations for Other Services       Precautions / Restrictions Precautions Precautions: Fall Restrictions Weight Bearing Restrictions: No      Mobility  Bed Mobility Overal bed mobility: Modified Independent                  Transfers Overall transfer level: Needs assistance Equipment used: None Transfers: Sit to/from Stand Sit to Stand: Min assist           General transfer comment: Min A for steadying assist     Ambulation/Gait Ambulation/Gait assistance: Min guard Gait Distance (Feet): 20 Feet Assistive device: None Gait Pattern/deviations: Step-to pattern;Decreased step length - right;Decreased stance time - right Gait velocity: Decreased     General Gait Details: Pt tending to drag RLE during mobility tasks. Per brother, this is baseline. Min guard for safety.  Stairs            Wheelchair Mobility    Modified Rankin (Stroke Patients Only) Modified Rankin (Stroke Patients Only) Pre-Morbid Rankin Score: Moderate disability Modified Rankin: Moderately severe disability     Balance Overall balance assessment: Needs assistance Sitting-balance support: No upper extremity supported;Feet supported Sitting balance-Leahy Scale: Fair     Standing balance support: No upper extremity supported Standing balance-Leahy Scale: Fair                               Pertinent Vitals/Pain Pain Assessment: No/denies pain    Home Living Family/patient expects to be discharged to:: Private residence Living Arrangements: Children Available Help at Discharge: Family Type of Home: House Home Access: Ramped entrance       Home Layout: Two level;Able to live on main level with bedroom/bathroom Home Equipment: Rolling Walker (2 wheels);Cane - single point;Shower seat      Prior Function Prior Level of Function : Independent/Modified Independent                     Hand Dominance        Extremity/Trunk Assessment   Upper Extremity Assessment Upper Extremity Assessment: Defer to OT evaluation  Lower Extremity Assessment Lower Extremity Assessment: Generalized weakness;RLE deficits/detail RLE Deficits / Details: Functional weakness noted in RLE    Cervical / Trunk Assessment Cervical / Trunk Assessment: Normal  Communication   Communication: Receptive difficulties;Expressive difficulties  Cognition Arousal/Alertness: Awake/alert Behavior During Therapy:  WFL for tasks assessed/performed Overall Cognitive Status: Difficult to assess                                          General Comments General comments (skin integrity, edema, etc.): Pt's brother present    Exercises     Assessment/Plan    PT Assessment Patient needs continued PT services  PT Problem List Decreased strength;Decreased mobility;Decreased activity tolerance;Decreased balance;Decreased knowledge of use of DME;Decreased knowledge of precautions;Decreased safety awareness       PT Treatment Interventions Gait training;Functional mobility training;Balance training;Patient/family education;Therapeutic exercise;Neuromuscular re-education;Cognitive remediation    PT Goals (Current goals can be found in the Care Plan section)  Acute Rehab PT Goals PT Goal Formulation: Patient unable to participate in goal setting Time For Goal Achievement: 05/30/21 Potential to Achieve Goals: Good    Frequency Min 3X/week   Barriers to discharge        Co-evaluation               AM-PAC PT "6 Clicks" Mobility  Outcome Measure Help needed turning from your back to your side while in a flat bed without using bedrails?: None Help needed moving from lying on your back to sitting on the side of a flat bed without using bedrails?: None Help needed moving to and from a bed to a chair (including a wheelchair)?: A Little Help needed standing up from a chair using your arms (e.g., wheelchair or bedside chair)?: A Little Help needed to walk in hospital room?: A Little Help needed climbing 3-5 steps with a railing? : A Little 6 Click Score: 20    End of Session Equipment Utilized During Treatment: Gait belt Activity Tolerance: Patient tolerated treatment well Patient left: in bed;with call bell/phone within reach (on stretcher in ED) Nurse Communication: Mobility status PT Visit Diagnosis: Other abnormalities of gait and mobility (R26.89)    Time: 8889-1694 PT  Time Calculation (min) (ACUTE ONLY): 14 min   Charges:   PT Evaluation $PT Eval Moderate Complexity: 1 Mod          Reuel Derby, PT, DPT  Acute Rehabilitation Services  Pager: [phone] Office: [phone]   Rudean Hitt 05/16/2021, 1:22 PM

## 2021-05-16 NOTE — ED Notes (Signed)
To MRI via stretcher while eating breakfast

## 2021-05-16 NOTE — Procedures (Signed)
Patient Name: Courtney Burke  MRN: [id number]  Epilepsy Attending: Lora Havens  Referring Physician/Provider: Dr Amie [place] Date: 05/16/2021 Duration: 23.25 mins  Patient history:  64 yo F with hx recent CVA presenting aphasia, headache s/p syncopal event. EEG to evaluate for seizure  Level of alertness: Awake  AEDs during EEG study: LEV  Technical aspects: This EEG study was done with scalp electrodes positioned according to the 10-20 International system of electrode placement. Electrical activity was acquired at a sampling rate of 500Hz  and reviewed with a high frequency filter of 70Hz  and a low frequency filter of 1Hz . EEG data were recorded continuously and digitally stored.   Description: The posterior dominant rhythm consists of 9Hz  activity of moderate voltage (25-35 uV) seen predominantly in posterior head regions, symmetric and reactive to eye opening and eye closing.  EEG showed intermittent independent left and right temporal 2 to 3 Hz delta slowing.  Hyperventilation and photic stimulation were not performed.     ABNORMALITY -Intermittent slow, left and right temporal region  IMPRESSION: This study is suggestive of cortical dysfunction arising from left and right temporal region, nonspecific etiology but most likely secondary to underlying strokes. No seizures or epileptiform discharges were seen throughout the recording.  [name] Barbra Sarks

## 2021-05-17 ENCOUNTER — Ambulatory Visit: Payer: Medicare Other | Admitting: Adult Health

## 2021-05-17 DIAGNOSIS — I63511 Cerebral infarction due to unspecified occlusion or stenosis of right middle cerebral artery: Principal | ICD-10-CM

## 2021-05-17 LAB — CBC
HCT: 28.2 % — ABNORMAL LOW (ref 36.0–46.0)
Hemoglobin: 10 g/dL — ABNORMAL LOW (ref 12.0–15.0)
MCH: 29.2 pg (ref 26.0–34.0)
MCHC: 35.5 g/dL (ref 30.0–36.0)
MCV: 82.5 fL (ref 80.0–100.0)
Platelets: 303 10*3/uL (ref 150–400)
RBC: 3.42 MIL/uL — ABNORMAL LOW (ref 3.87–5.11)
RDW: 15.2 % (ref 11.5–15.5)
WBC: 4.2 10*3/uL (ref 4.0–10.5)
nRBC: 0 % (ref 0.0–0.2)

## 2021-05-17 LAB — GLUCOSE, CAPILLARY
Glucose-Capillary: 85 mg/dL (ref 70–99)
Glucose-Capillary: 91 mg/dL (ref 70–99)
Glucose-Capillary: 86 mg/dL (ref 70–99)
Glucose-Capillary: 82 mg/dL (ref 70–99)

## 2021-05-17 LAB — BASIC METABOLIC PANEL
Anion gap: 7 (ref 5–15)
BUN: 16 mg/dL (ref 8–23)
CO2: 21 mmol/L — ABNORMAL LOW (ref 22–32)
Calcium: 8.8 mg/dL — ABNORMAL LOW (ref 8.9–10.3)
Chloride: 110 mmol/L (ref 98–111)
Creatinine, Ser: 1.43 mg/dL — ABNORMAL HIGH (ref 0.44–1.00)
GFR, Estimated: 40 mL/min — ABNORMAL LOW (ref >60)
Glucose, Bld: 77 mg/dL (ref 70–99)
Potassium: 3.4 mmol/L — ABNORMAL LOW (ref 3.5–5.1)
Sodium: 138 mmol/L (ref 135–145)

## 2021-05-17 MED ORDER — LEVETIRACETAM 500 MG PO TABS
1000.0000 mg | ORAL_TABLET | Freq: Two times a day (BID) | ORAL | Status: DC
  Administered 2021-05-17 – 2021-05-18 (×2): 1000 mg via ORAL
  Filled 2021-05-17 (×2): qty 2

## 2021-05-17 MED ORDER — LEVETIRACETAM 500 MG PO TABS
500.0000 mg | ORAL_TABLET | Freq: Once | ORAL | Status: AC
  Administered 2021-05-17: 16:00:00 500 mg via ORAL
  Filled 2021-05-17: qty 1

## 2021-05-17 MED ORDER — ATORVASTATIN CALCIUM 40 MG PO TABS: 40.0000 mg | ORAL_TABLET | Freq: Every day | ORAL | 0 refills | Status: DC

## 2021-05-17 MED ORDER — ASPIRIN 81 MG PO TBEC: 81.0000 mg | DELAYED_RELEASE_TABLET | Freq: Every day | ORAL | 0 refills | Status: AC

## 2021-05-17 MED ORDER — POTASSIUM CHLORIDE CRYS ER 20 MEQ PO TBCR
20.0000 meq | EXTENDED_RELEASE_TABLET | Freq: Once | ORAL | Status: AC
  Administered 2021-05-17: 09:00:00 20 meq via ORAL
  Filled 2021-05-17: qty 1

## 2021-05-17 NOTE — Evaluation (Signed)
Occupational Therapy Evaluation Patient Details Name: Courtney Burke MRN: [id number] DOB: 09/29/1955 Today's Date: 05/17/2021   History of Present Illness Pt is a 65 y/o female admitted 12/12 secondary to syncope. 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 PMH includes CVA, CKD, asthma, HTN, DM, and seizures.   Clinical Impression   Courtney Burke was evaluated s/p the above admission list; PTA she was mod I for BADLs and mobility. She lives in a 2 level home, ramped entrance with her daughter who is able to assist as needed. Upon evaluation pt is limited by expressive/receptive communication impairments, impaired cognition, R side weakness and impaired balance. Overall pt required supervision-min guard for functional ambulation and up to min A for ADLs. She would benefit from continued OT acutely however she has plans fro d/c home today. Recommend neuro OP OT.      Recommendations for follow up therapy are one component of a multi-disciplinary discharge planning process, led by the attending physician.  Recommendations may be updated based on patient status, additional functional criteria and insurance authorization.   Follow Up Recommendations  Outpatient OT    Assistance Recommended at Discharge Intermittent Supervision/Assistance  Functional Status Assessment  Patient has had a recent decline in their functional status and demonstrates the ability to make significant improvements in function in a reasonable and predictable amount of time.  Equipment Recommendations  None recommended by OT       Precautions / Restrictions Precautions Precautions: Fall Restrictions Weight Bearing Restrictions: No      Mobility Bed Mobility Overal bed mobility: Modified Independent             General bed mobility comments: no use of rail    Transfers Overall transfer level: Needs assistance Equipment used:  None Transfers: Sit to/from Stand Sit to Stand: Min guard           General transfer comment: incrased safety with RW      Balance Overall balance assessment: Needs assistance Sitting-balance support: No upper extremity supported;Feet supported Sitting balance-Leahy Scale: Fair     Standing balance support: No upper extremity supported Standing balance-Leahy Scale: Fair                             ADL either performed or assessed with clinical judgement   ADL Overall ADL's : Needs assistance/impaired Eating/Feeding: Independent;Sitting   Grooming: Supervision/safety;Standing   Upper Body Bathing: Set up;Sitting   Lower Body Bathing: Minimal assistance;Sit to/from stand   Upper Body Dressing : Set up;Sitting   Lower Body Dressing: Min guard;Sit to/from stand   Toilet Transfer: Min guard;Ambulation;Rolling walker (2 wheels)   Toileting- Clothing Manipulation and Hygiene: Supervision/safety;Sitting/lateral lean       Functional mobility during ADLs: Min guard;Rolling walker (2 wheels)       Vision Baseline Vision/History: 0 No visual deficits Ability to See in Adequate Light: 0 Adequate Patient Visual Report: No change from baseline Vision Assessment?: Yes Eye Alignment: Within Functional Limits Tracking/Visual Pursuits: Requires cues, head turns, or add eye shifts to track Additional Comments: difficult to fully assess. Pt denies vision changes. She had incrased head turns to the L.     Perception     Praxis      Pertinent Vitals/Pain Pain Assessment: No/denies pain     Hand Dominance Right   Extremity/Trunk Assessment Upper Extremity Assessment Upper Extremity Assessment: RUE deficits/detail;LUE deficits/detail RUE  Deficits / Details: AROM is WFL, strength is generally 4/5. weak grip strength. slow and deliberate coordination RUE Sensation: decreased light touch RUE Coordination: decreased fine motor LUE Deficits / Details: AROM is  WFL, strength is generally 4/5. weak grip strength. LUE Sensation: WNL LUE Coordination: WNL   Lower Extremity Assessment Lower Extremity Assessment: Defer to PT evaluation   Cervical / Trunk Assessment Cervical / Trunk Assessment: Normal   Communication Communication Communication: Receptive difficulties;Expressive difficulties   Cognition Arousal/Alertness: Awake/alert Behavior During Therapy: WFL for tasks assessed/performed Overall Cognitive Status: Difficult to assess                                 General Comments: pt following 50% of simple commands, she does much better with functional cues vs directional. Pt attempted to sit in bed when told to sit in chair. Able to communicate that she was hot (room therm at 80), and sequence through grooming tasks with no verbal cues.     General Comments  VSS on RA, no family present    Exercises     Shoulder Instructions      Home Living Family/patient expects to be discharged to:: Private residence Living Arrangements: Children Available Help at Discharge: Family Type of Home: House Home Access: Ramped entrance     [place]: Two level;Able to live on main level with bedroom/bathroom     Bathroom Shower/Tub: Teacher, early years/pre: Standard     Home Equipment: Conservation officer, nature (2 wheels);Cane - single point;Shower seat      Lives With: Family    Prior Functioning/Environment Prior Level of Function : Independent/Modified Independent                        OT Problem List: Decreased activity tolerance;Impaired balance (sitting and/or standing);Decreased coordination;Decreased cognition;Decreased safety awareness;Decreased knowledge of use of DME or AE      OT Treatment/Interventions: Self-care/ADL training;Therapeutic exercise;Energy conservation;DME and/or AE instruction;Therapeutic activities;Cognitive remediation/compensation;Patient/family education;Balance  training;Visual/perceptual remediation/compensation    OT Goals(Current goals can be found in the care plan section) Acute Rehab OT Goals Patient Stated Goal: unable to state OT Goal Formulation: With patient Time For Goal Achievement: 05/25/21 Potential to Achieve Goals: Good ADL Goals Pt Will Perform Grooming: with modified independence;standing Pt Will Perform Lower Body Bathing: with modified independence;sit to/from stand Pt Will Perform Lower Body Dressing: with modified independence;sit to/from stand Pt Will Transfer to Toilet: with modified independence;ambulating Pt/caregiver will Perform Home Exercise Program: Increased ROM;Increased strength;Both right and left upper extremity;With written HEP provided  OT Frequency: Min 2X/week   Barriers to D/C:            Co-evaluation              AM-PAC OT "6 Clicks" Daily Activity     Outcome Measure Help from another person eating meals?: None Help from another person taking care of personal grooming?: A Little Help from another person toileting, which includes using toliet, bedpan, or urinal?: A Little Help from another person bathing (including washing, rinsing, drying)?: A Little Help from another person to put on and taking off regular upper body clothing?: A Little Help from another person to put on and taking off regular lower body clothing?: A Little 6 Click Score: 19   End of Session Equipment Utilized During Treatment: Gait belt;Rolling walker (2 wheels) Nurse Communication: Mobility status  Activity Tolerance:  Patient tolerated treatment well Patient left: in chair;with call bell/phone within reach;with chair alarm set  OT Visit Diagnosis: Unsteadiness on feet (R26.81);Other abnormalities of gait and mobility (R26.89);Muscle weakness (generalized) (M62.81);Other symptoms and signs involving cognitive function                Time: 0912-0930 OT Time Calculation (min): 18 min Charges:  OT General Charges $OT  Visit: 1 Visit OT Evaluation $OT Eval Moderate Complexity: 1 Mod   Kohan Azizi A Dairon Procter 05/17/2021, 11:37 AM

## 2021-05-17 NOTE — Progress Notes (Signed)
Was paged to bedside by nurse after patient had an episode of unresponsiveness. Patient was about to be discharged and the nurse was going through her paperwork when all of the sudden she started looking up at the ceiling and not responding to her name. She had no jerking during this episode. This lasted about 1-2 minutes and then she was reportedly confused afterwards.  Went to bedside to assess  PE Gen: Elderly woman resting in chair in NAD CV: RRR no m/r/g Resp: speaking in normal sentences, normal WOB on room air Neuro: Alert, oriented, no changes from baseline expressive asphasia, no post-ictal state. Psych:tearful affect   A/P Suspect patient had another seizure. Spoke with neurology on the phone who do not feel like an EEG is needed at this time given that she is back to baseline. However given that patient had a possible episode like this one at home she may be having more seizures. She is likely still able to go home today but family is very worried about this happening and not having help/knowing what to do. Will observe her overnight for further episodes. - increase keppra to 1000 BID

## 2021-05-17 NOTE — Discharge Summary (Signed)
Name: Courtney Burke MRN: [id number] DOB: 1956-05-01 65 y.o. PCP: Stann Ore  Date of Admission: 05/15/2021  9:29 PM Date of Discharge:   05/17/21 Attending Physician: Axel Filler, *  Discharge Diagnosis: 1. 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 Paroxysmal atrial fibrillation 3. Stage IIIb chronic kidney disease 4. Type 2 DM 5. Chronic normocytic anemia 6. GERD 7. Breast lumps 8. Hematochezia  Discharge Medications: Allergies as of 05/17/2021       Reactions   Iodinated Diagnostic Agents Itching   Doxycycline Nausea And Vomiting   Sulfa Antibiotics Rash, Hives   Codeine Hives, Swelling   Swollen tongue   Hydrocodone Bit-homatrop Mbr Itching   Ace Inhibitors Cough, Itching, Rash        Medication List     STOP taking these medications    diphenhydrAMINE 50 MG tablet Commonly known as: BENADRYL   ticagrelor 90 MG Tabs tablet Commonly known as: BRILINTA   Vitamin D3 25 MCG tablet Commonly known as: Vitamin D       TAKE these medications    acetaminophen 500 MG tablet Commonly known as: TYLENOL Take 1,000 mg by mouth every 6 (six) hours as needed for moderate pain or headache.   aspirin 81 MG EC tablet Take 1 tablet (81 mg total) by mouth daily. Swallow whole.   atorvastatin 40 MG tablet Commonly known as: LIPITOR Take 1 tablet (40 mg total) by mouth at bedtime. What changed:  medication strength how much to take   Eliquis 5 MG Tabs tablet Generic drug: apixaban TAKE 1 TABLET BY MOUTH TWICE A DAY What changed: how much to take   levETIRAcetam 500 MG tablet Commonly known as: Keppra Take 1 tablet (500 mg total) by mouth 2 (two) times daily.   multivitamin with minerals Tabs tablet Take 1 tablet by mouth daily.   omeprazole 40 MG capsule Commonly known as: PRILOSEC Take 40 mg by mouth daily.   ursodiol 250 MG tablet Commonly known as:  ACTIGALL Take 250 mg by mouth 2 (two) times daily.        Disposition and follow-up:   Courtney Burke was discharged from [facility] in Good condition.  At the hospital follow up visit please address:  1.  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 Breast lumps- make sure patient was able to reschedule mammogram  3. Hematochezia- make sure patient was able to reschedule colonoscopy.  4. CKD- recheck creatinine  5.  Labs / imaging needed at time of follow-up: bmp  6.  Pending labs/ test needing follow-up: none  Follow-up Appointments:  Follow-up Information     [facility]. Schedule an appointment as soon as possible for a visit.   Contact information: [place] by problem list: 1. 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 MRI of her brain showed extension of her prior CVA which neurology thought was related to a hypotensive event likely in the setting of poor PO intake and the vomiting. Luckily she had no further deficits or symptoms from the  stroke progression and was seen by PT who still recommended HH. Patient's labs were otherwise unremarkable and her WBC was WNL. She did not have signs of an infection to suggest another cause for the hypotension. She was given IV fluids and was encouraged to increase her PO intake as an outpatient. She will discharge on aspirin 81 and eliquis per neurology. She will continue [facility] PT/OT and speech therapy.  2. Paroxysmal atrial fibrillation She was in normal sinus rhythm during admission. Continued on home eliquis  3. Stage IIIb chronic kidney disease Creatinine was  slightly elevated on admission, improved back to baseline after some fluids (1.3-1.4)  4. Type 2 DM Hemoglobin A1c on 12/8 was 5.5.  Currently not on any diabetic medications.  Blood sugars were well controlled during admission.  5. Chronic normocytic anemia Hemoglobin stable.  No signs or symptoms of bleed. Required no transfusions  6. GERD Continued home protonix 40 mg  7. Breast lumps Patient complains of multiple breast lumps in her right breast, 2 of which were appreciated on exam.  1 was midline slightly closer to the right breast at approximately 3 o'clock position and nontender to palpation.  The second 1 was along the inferior margin of her right breast at the approximate 5 o'clock position.  No skin changes noted.  Patient is due for a breast mammogram however this was not done because she was admitted into the hospital.  Will need to arrange for a diagnostic mammogram.  8. Hematochezia Patient reports 2 episodes of bright red blood in her stool over the past few months.  She was scheduled to have a colonoscopy however this was not done due to her being in the hospital.  Will need to reschedule  Discharge subjective Feeling better this morning. She is very grateful for all the help she has gotten. She is looking forward to going home. Has not yet had any breakfast,  Discharge Exam:   BP 123/73 (BP Location: Right Arm)    Pulse (!) 58    Temp 98.4 F (36.9 C) (Oral)    Resp 16    LMP 09/28/2012    SpO2 99%  Constitutional: Elderly woman resting comfortably in bed, in no acute distress Cardiovascular: regular rate and rhythm, no m/r/g Pulmonary/Chest: normal work of breathing on room air Abdominal: soft, non-tender, non-distended MSK: normal bulk and tone Neurological: alert & oriented x 3, strength at baseline in bilateral extremities, expressive aphasia Skin: warm and dry Psych: normal affect    Pertinent Labs, Studies, and Procedures:   MR BRAIN WO CONTRAST  Result  Date: 05/16/2021 CLINICAL DATA:  Stroke.  Follow-up.  Intracranial stent. EXAM: MRI HEAD WITHOUT CONTRAST TECHNIQUE: Multiplanar, multiecho pulse sequences of the brain and surrounding structures were obtained without intravenous contrast. COMPARISON:  Head CT yesterday.  MRI 05/10/2021. FINDINGS: Brain: Compared to the study of 6 days ago, there is been some extension of the acute infarction in the right posterior temporal region. Volume of infarction is increased by 50%. New punctate infarctions seen at the margins of the previous infarction. Other punctate acute infarctions at the left temporoparietal junction and left posterior frontal cortical brain are unchanged. Few punctate infarctions in the right parietal region are unchanged. Old infarction within the left MCA territory is unchanged, with some hemosiderin deposition. Old small vessel infarctions of the cerebellum as seen previously. No evidence of acute hemorrhage, hydrocephalus or extra-axial collection. Vascular: Major vessels at the base of the brain show flow. Artifact present related to  the left MCA stent. 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 No evidence of hemorrhage in that region. No change in small areas of acute infarction at the left temporoparietal junction and left posterior frontal region are unchanged. Few punctate acute infarctions in the right parietal lobe are unchanged. Electronically Signed   By: Nelson Chimes M.D.   On: 05/16/2021 09:16   EEG adult  Result Date: 05/16/2021 Lora Havens, MD     05/16/2021 12:54 PM Patient Name: Adriel Desrosier MRN: [id number] Epilepsy Attending: Lora Havens Referring Physician/Provider: Dr Amie [place] Date:  05/16/2021 Duration: 23.25 mins Patient history:  65 yo F with hx recent CVA presenting aphasia, headache s/p syncopal event. EEG to evaluate for seizure Level of alertness: Awake AEDs during EEG study: LEV Technical aspects: This EEG study was done with scalp electrodes positioned according to the 10-20 International system of electrode placement. Electrical activity was acquired at a sampling rate of 500Hz  and reviewed with a high frequency filter of 70Hz  and a low frequency filter of 1Hz . EEG data were recorded continuously and digitally stored. Description: The posterior dominant rhythm consists of 9Hz  activity of moderate voltage (25-35 uV) seen predominantly in posterior head regions, symmetric and reactive to eye opening and eye closing.  EEG showed intermittent independent left and right temporal 2 to 3 Hz delta slowing.  Hyperventilation and photic stimulation were not performed.   ABNORMALITY -Intermittent slow, left and right temporal region IMPRESSION: This study is suggestive of cortical dysfunction arising from left and right temporal region, nonspecific etiology but most likely secondary to underlying strokes. No seizures or epileptiform discharges were seen throughout the recording. Lora Havens   CT HEAD CODE STROKE WO CONTRAST  Result Date: 05/15/2021 CLINICAL DATA:  Code stroke. EXAM: CT HEAD WITHOUT CONTRAST TECHNIQUE: Contiguous axial images were obtained from the base of the skull through the vertex without intravenous contrast. COMPARISON:  05/11/2021. FINDINGS: Brain: Slightly increased hypodensity in the right posterior temporal lobe and lateral occipital lobe, consistent with evolution of a previously noted acute infarct, without significant increase in size or evidence of acute hemorrhage. Additional more chronic left frontal, temporal, occipital, and parietal encephalomalacia is also unchanged. Remote right cerebellar infarct. No acute hemorrhage, mass, mass effect, or midline  shift. No definite new area of infarction. No extra-axial collection or hydrocephalus. Vascular: Redemonstrated left M1 stent.  No hyperdense vessel. Skull: Normal. Negative for fracture or focal lesion. Sinuses/Orbits: No acute finding. Other: The mastoids are well aerated. ASPECTS [facility] Stroke Program Early CT Score) - Ganglionic level infarction (caudate, lentiform nuclei, internal capsule, insula, M1-M3 cortex): 7 - Supraganglionic infarction (M4-M6 cortex): 3 Total score (0-10 with 10 being normal): 10 IMPRESSION: 1. No acute intracranial process. 2. Slightly increased hypodensity in the right posterior temporal lobe and lateral occipital lobe, consistent with evolution of previously noted acute infarct. 3. ASPECTS is 10 Code stroke imaging results were communicated on 05/15/2021 at 9:47 pm to provider OSIAS via secure text paging. Electronically Signed   By: Merilyn Baba M.D.   On: 05/15/2021 21:49    CBC Latest Ref Rng & Units 05/17/2021 05/16/2021 05/15/2021  WBC 4.0 - 10.5 K/uL 4.2 4.9 -  Hemoglobin 12.0 - 15.0 g/dL 10.0(L) 10.1(L) 10.5(L)  Hematocrit 36.0 - 46.0 %  28.2(L) 30.8(L) 31.0(L)  Platelets 150 - 400 K/uL 303 328 -    BMP Latest Ref Rng & Units 05/17/2021 05/16/2021 05/15/2021  Glucose 70 - 99 mg/dL 77 88 126(H)  BUN 8 - 23 mg/dL 16 13 16   Creatinine 0.44 - 1.00 mg/dL 1.43(H) 1.39(H) 1.70(H)  Sodium 135 - 145 mmol/L 138 138 141  Potassium 3.5 - 5.1 mmol/L 3.4(L) 3.7 4.0  Chloride 98 - 111 mmol/L 110 112(H) 112(H)  CO2 22 - 32 mmol/L 21(L) 19(L) -  Calcium 8.9 - 10.3 mg/dL 8.8(L) 9.0 -    Discharge Instructions: Discharge Instructions     Ambulatory referral to Occupational Therapy   Complete by: As directed    Ambulatory referral to Physical Therapy   Complete by: As directed    Ambulatory referral to Speech Therapy   Complete by: As directed    Call MD for:  difficulty breathing, headache or visual disturbances   Complete by: As directed    Call MD for:  persistant  dizziness or light-headedness   Complete by: As directed    Call MD for:  persistant nausea and vomiting   Complete by: As directed    Diet - low sodium heart healthy   Complete by: As directed    Increase activity slowly   Complete by: As directed    Increase activity slowly   Complete by: As directed        Signed: [name], MD 05/17/2021, 11:38 AM   Pager: [phone]

## 2021-05-17 NOTE — Discharge Instructions (Addendum)
Shilee Biggs  You were recently admitted to [facility] after passing out. We gave you some fluids and took some pictures of your brain. We think that this episode was caused by getting dehydrated which led to some worsening of your prior stroke symptoms. It will be important to stay well hydrated when you go home. Continue working with physical therapy and speech therapy. You also may have had another seizure so we increased your seizure medications. You should follow up with the neurologists in clinic.  Continue taking your home medications with the following changes  Decrease your atorvastatin to 40 mg daily. Stop taking Brilinta. Start taking aspirin 81 mg daily. Continue taking eliquis Increase your keppra to 1000 mg twice a day   You should seek further medical care if you have further dizziness, lightheadedness, one sided weakness, confusion, or facial droop.  We recommend that you see your primary care doctor in about a week to make sure that you continue to improve. I called them to help schedule you an appointment and they should be calling you with an appointment. If you are unable to get a sooner appointment you can call our clinic and schedule an appointment. Our number is [phone]  We are so glad that you are feeling better.  Sincerely, [name], MD

## 2021-05-17 NOTE — TOC Transition Note (Signed)
Transition of Care [facility]) - CM/SW Discharge Note   Patient Details  Name: Addylynn Balin MRN: [id number] Date of Birth: 01/19/1956  [facility]) CM/SW Contact:  Pollie Friar, RN Phone Number: 05/17/2021, 11:26 AM   Clinical Narrative:    Patient is discharging home with resumption of outpatient therapy through [facility] will fax them the orders for resumption of services.  Daughter to provide transport home after work. CM will update the bedside RN.    Final next level of care: OP Rehab Barriers to Discharge: No Barriers Identified   Patient Goals and CMS Choice     Choice offered to / list presented to : Adult Children  Discharge Placement                       Discharge Plan and Services                                     Social Determinants of Health (SDOH) Interventions     Readmission Risk Interventions No flowsheet data found.

## 2021-05-17 NOTE — Plan of Care (Signed)
  Problem: Activity: Goal: Risk for activity intolerance will decrease Outcome: Progressing   Problem: Nutrition: Goal: Adequate nutrition will be maintained Outcome: Progressing   Problem: Coping: Goal: Level of anxiety will decrease Outcome: Progressing   

## 2021-05-17 NOTE — Progress Notes (Signed)
HD#1 Subjective:  Overnight Events: NAEO   Patient is very upset about not being able to leave today. She says that she wants to go home. She is trying so hard to get better and is praying a lot.  Objective:  Vital signs in last 24 hours: Vitals:   05/16/21 1900 05/16/21 2051 05/16/21 2343 05/17/21 0324  BP: 119/89 125/71 122/81 127/73  Pulse: 70 61 70 61  Resp: 20 16 16 15   Temp:  98.9 F (37.2 C) 98.6 F (37 C) 98.1 F (36.7 C)  TempSrc:  Oral Oral Oral  SpO2: 100% 100% 99% 100%   Supplemental O2: Room Air SpO2: 100 %   Physical Exam:  Constitutional: Elderly woman resting comfortably in bed, in no acute distress Cardiovascular: regular rate and rhythm, no m/r/g Pulmonary/Chest: normal work of breathing on room air Abdominal: soft, non-tender, non-distended MSK: normal bulk and tone Neurological: alert & oriented x 3, strength at baseline in bilateral extremities, expressive aphasia; no postictal state Skin: warm and dry Psych: normal affect  There were no vitals filed for this visit.  No intake or output data in the 24 hours ending 05/17/21 0703 Net IO Since Admission: No IO data has been entered for this period [05/17/21 0703]  Pertinent Labs: CBC Latest Ref Rng & Units 05/17/2021 05/16/2021 05/15/2021  WBC 4.0 - 10.5 K/uL 4.2 4.9 -  Hemoglobin 12.0 - 15.0 g/dL 10.0(L) 10.1(L) 10.5(L)  Hematocrit 36.0 - 46.0 % 28.2(L) 30.8(L) 31.0(L)  Platelets 150 - 400 K/uL 303 328 -    CMP Latest Ref Rng & Units 05/17/2021 05/16/2021 05/15/2021  Glucose 70 - 99 mg/dL 77 88 126(H)  BUN 8 - 23 mg/dL 16 13 16   Creatinine 0.44 - 1.00 mg/dL 1.43(H) 1.39(H) 1.70(H)  Sodium 135 - 145 mmol/L 138 138 141  Potassium 3.5 - 5.1 mmol/L 3.4(L) 3.7 4.0  Chloride 98 - 111 mmol/L 110 112(H) 112(H)  CO2 22 - 32 mmol/L 21(L) 19(L) -  Calcium 8.9 - 10.3 mg/dL 8.8(L) 9.0 -  Total Protein 6.5 - 8.1 g/dL - - -  Total Bilirubin 0.3 - 1.2 mg/dL - - -  Alkaline Phos 38 - 126 U/L - - -   AST 15 - 41 U/L - - -  ALT 0 - 44 U/L - - -    Imaging: MR BRAIN WO CONTRAST  Result Date: 05/16/2021 CLINICAL DATA:  Stroke.  Follow-up.  Intracranial stent. EXAM: MRI HEAD WITHOUT CONTRAST TECHNIQUE: Multiplanar, multiecho pulse sequences of the brain and surrounding structures were obtained without intravenous contrast. COMPARISON:  Head CT yesterday.  MRI 05/10/2021. FINDINGS: Brain: Compared to the study of 6 days ago, there is been some extension of the acute infarction in the right posterior temporal region. Volume of infarction is increased by 50%. New punctate infarctions seen at the margins of the previous infarction. Other punctate acute infarctions at the left temporoparietal junction and left posterior frontal cortical brain are unchanged. Few punctate infarctions in the right parietal region are unchanged. Old infarction within the left MCA territory is unchanged, with some hemosiderin deposition. Old small vessel infarctions of the cerebellum as seen previously. No evidence of acute hemorrhage, hydrocephalus or extra-axial collection. Vascular: Major vessels at the base of the brain show flow. Artifact present related to the left MCA stent. Skull and upper cervical spine: Negative Sinuses/Orbits: Clear/normal Other: None IMPRESSION: Extension of the acute infarction in the right MCA territory at the right temporoparietal junction, particularly along the inferior margin  of the infarction and with a few tiny punctate satellite foci additionally present. No evidence of hemorrhage in that region. No change in small areas of acute infarction at the left temporoparietal junction and left posterior frontal region are unchanged. Few punctate acute infarctions in the right parietal lobe are unchanged. Electronically Signed   By: Nelson Chimes M.D.   On: 05/16/2021 09:16   EEG adult  Result Date: 05/16/2021 Lora Havens, MD     05/16/2021 12:54 PM Patient Name: Charmian Forbis MRN:  [id number] Epilepsy Attending: Lora Havens Referring Physician/Provider: Dr Amie [place] Date: 05/16/2021 Duration: 23.25 mins Patient history:  65 yo F with hx recent CVA presenting aphasia, headache s/p syncopal event. EEG to evaluate for seizure Level of alertness: Awake AEDs during EEG study: LEV Technical aspects: This EEG study was done with scalp electrodes positioned according to the 10-20 International system of electrode placement. Electrical activity was acquired at a sampling rate of 500Hz  and reviewed with a high frequency filter of 70Hz  and a low frequency filter of 1Hz . EEG data were recorded continuously and digitally stored. Description: The posterior dominant rhythm consists of 9Hz  activity of moderate voltage (25-35 uV) seen predominantly in posterior head regions, symmetric and reactive to eye opening and eye closing.  EEG showed intermittent independent left and right temporal 2 to 3 Hz delta slowing.  Hyperventilation and photic stimulation were not performed.   ABNORMALITY -Intermittent slow, left and right temporal region IMPRESSION: This study is suggestive of cortical dysfunction arising from left and right temporal region, nonspecific etiology but most likely secondary to underlying strokes. No seizures or epileptiform discharges were seen throughout the recording. Priyanka Barbra Sarks    Assessment/Plan:   Principal Problem:   CVA (cerebral vascular accident) [facility]) Active Problems:   Asthma   Stage 3b chronic kidney disease ([place])   Syncope   Breast lump   Seizure [facility])   Patient Summary: Jemiah Cuadra is a 65 y.o. 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  Episode of  unresponsiveness Patient was initially going to be discharged today given that she was back to her baseline, however there was a witnessed seizure like episode that lasted 1-2 minutes. Patient was unresponsive at that time and was staring at the ceiling. She was reportedly confused for a few minutes after this but was back to baseline by the time patient was evaluated at bedside by providers. Spoke with neurology who said that getting an EEG would be low yield at this time given that her prior studies this admission had been normal and she was back to baseline now. Will increase her keppra to 1000 BID and watch her overnight. - keppra 1000 BID   Extension of the acute infarction in the right MCA territory at the right temporoparietal junction History of left MCA infarction status post stenting and thrombectomy Patient coming in after a syncopal episode and some possible nausea and vomiting which likely led to a hypotensive event causing extension of the penumbra of the prior CVA. Per neurology she is finished with brilinta and should continue on aspirin 81 as well as her eliquis for afib. She was back to baseline this morning and was initially going to be discharged however after staring spell above  she will be observed overnight. - aspirin 81 - atorvastatin - avoid low BP   Paroxysmal atrial fibrillation In normal sinus rhythm.  Continue Eliquis.   Stage IIIb chronic kidney disease Creatinine 1.5 on admission, baseline ~1.3-1.4.  -Trend BMP -Avoid nephrotoxic agents   Type 2 DM Hemoglobin A1c on 12/8 was 5.5.  Currently not on any diabetic medications.  Blood sugars have been normal since arrival. -CBG monitoring   Chronic normocytic anemia Hemoglobin stable at 10.7.  No signs or symptoms of bleed. -Trend CBC   GERD Home medication includes omeprazole 40 mg daily, continue Protonix 40 mg.   Breast lumps Patient complains of multiple breast lumps in her right breast, 2 of which were  appreciated on exam.  1 was midline slightly closer to the right breast at approximately 3 o'clock position and nontender to palpation.  The second 1 was along the inferior margin of her right breast at the approximate 5 o'clock position.  No skin changes noted.  Patient is due for a breast mammogram however this was not done because she was admitted into the hospital.  Will need to arrange for a diagnostic mammogram.   Hematochezia Patient reports 2 episodes of bright red blood in her stool over the past few months.  She was scheduled to have a colonoscopy however this was not done due to her being in the hospital.  Will need to reschedule    [name], MD Internal Medicine Resident PGY-1 Pager [phone] Please contact the on call pager after 5 pm and on weekends at [phone].

## 2021-05-17 NOTE — Progress Notes (Signed)
Received from ED on cart accompanied by nurse.  Oriented to room and surroundings.  No signs of distress.

## 2021-05-17 NOTE — Progress Notes (Signed)
Physical Therapy Treatment Patient Details Name: Courtney Burke MRN: [id number] DOB: [birth year] Today's Date: 05/17/2021   History of Present Illness Pt is a 65 y/o female admitted 12/12 secondary to syncope. 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 PMH includes CVA, CKD, asthma, HTN, DM, and seizures.    PT Comments    Pt received in chair, agreeable to therapy session and does well with visual/functional cues as she has difficulty understanding verbal cues using directional words (she frequently mixes up "left" and "right"). Pt making good progress with gait/stair training, mostly Supervision for gait without AD and for stairs with BUE support of rails. Mod cues for safe step sequencing on stairs, no LOB. BP stable seated/standing with limited orthostatic assessment (BP 129/83 seated and BP 116/83 standing, B TED hose donned throughout). Pt continues to benefit from PT services to progress toward functional mobility goals.    Recommendations for follow up therapy are one component of a multi-disciplinary discharge planning process, led by the attending physician.  Recommendations may be updated based on patient status, additional functional criteria and insurance authorization.  Follow Up Recommendations  Outpatient PT     Assistance Recommended at Discharge Frequent or constant Supervision/Assistance  Equipment Recommendations  None recommended by PT    Recommendations for Other Services       Precautions / Restrictions Precautions Precautions: Fall Precaution Comments: decr expressive and receptive language Restrictions Weight Bearing Restrictions: No     Mobility  Bed Mobility Overal bed mobility: Modified Independent             General bed mobility comments: received/remained in chair    Transfers Overall transfer level: Needs assistance Equipment used: None Transfers: Sit to/from  Stand Sit to Stand: Min guard           General transfer comment: from chair    Ambulation/Gait Ambulation/Gait assistance: Supervision;Min guard Gait Distance (Feet): 150 Feet Assistive device: None Gait Pattern/deviations: Step-to pattern;Decreased step length - right;Decreased stance time - right;Shuffle Gait velocity: Decreased Gait velocity interpretation: <1.8 ft/sec, indicate of risk for recurrent falls   General Gait Details: Pt at times dragging RLE but when cued to attend to RLE, does better with heel strike/step length. No LOB and mostly close Supervision for safety.   Stairs Stairs: Yes Stairs assistance: Supervision Stair Management: Two rails;Forwards;Step to pattern Number of Stairs: 10 General stair comments: cues for step sequencing, pt does not understand L/R cues and did better with "up with your strong leg and down with your weak leg"   Wheelchair Mobility    Modified Rankin (Stroke Patients Only) Modified Rankin (Stroke Patients Only) Pre-Morbid Rankin Score: Moderate disability Modified Rankin: Moderately severe disability     Balance Overall balance assessment: Needs assistance Sitting-balance support: No upper extremity supported;Feet supported Sitting balance-Leahy Scale: Fair     Standing balance support: No upper extremity supported Standing balance-Leahy Scale: Fair                              Cognition Arousal/Alertness: Awake/alert Behavior During Therapy: WFL for tasks assessed/performed Overall Cognitive Status: Difficult to assess                                 General Comments: pt following >50% of simple commands, she does much better with functional cues  vs directional. Pt frequently mixing up L/R directional commands. Pt frequently misunderstanding verbal cues/statements from PTA.        Exercises      General Comments General comments (skin integrity, edema, etc.): HR WFL per tele, BP  stable seated/standing with knee high TED hose donned      Pertinent Vitals/Pain Pain Assessment: No/denies pain    Home Living Family/patient expects to be discharged to:: Private residence Living Arrangements: Children Available Help at Discharge: Family Type of Home: House Home Access: Ramped entrance       Home Layout: Two level;Able to live on main level with bedroom/bathroom Home Equipment: Rolling Walker (2 wheels);Cane - single point;Shower seat          PT Goals (current goals can now be found in the care plan section) Acute Rehab PT Goals PT Goal Formulation: Patient unable to participate in goal setting Time For Goal Achievement: 05/30/21 Progress towards PT goals: Progressing toward goals    Frequency    Min 3X/week      PT Plan Current plan remains appropriate       AM-PAC PT "6 Clicks" Mobility   Outcome Measure  Help needed turning from your back to your side while in a flat bed without using bedrails?: None Help needed moving from lying on your back to sitting on the side of a flat bed without using bedrails?: None Help needed moving to and from a bed to a chair (including a wheelchair)?: A Little Help needed standing up from a chair using your arms (e.g., wheelchair or bedside chair)?: A Little Help needed to walk in hospital room?: A Little Help needed climbing 3-5 steps with a railing? : A Little 6 Click Score: 20    End of Session Equipment Utilized During Treatment: Gait belt Activity Tolerance: Patient tolerated treatment well Patient left: in chair;with call bell/phone within reach;with chair alarm set Nurse Communication: Mobility status PT Visit Diagnosis: Other abnormalities of gait and mobility (R26.89)     Time: 3403-7096 PT Time Calculation (min) (ACUTE ONLY): 18 min  Charges:  $Gait Training: 8-22 mins                     Brecken Dewoody P., PTA Acute Rehabilitation Services Pager: [phone] Office: [place] 05/17/2021, 2:41 PM

## 2021-05-17 NOTE — Progress Notes (Signed)
STROKE TEAM PROGRESS NOTE   SUBJECTIVE (INTERVAL HISTORY) No family is at the bedside. On rounding, pt was sitting in chair for lunch, awake alert and follows most simple commands, still has expressive aphasia and word salad. Moving all extremities.   Later, I was informed by resident that pt had episode of staring off and not responsive, observed by RN, no shaking jerking, resolved within a min. When resident seeing the pt, pt has back to baseline. EEG yesterday no seizure. She is on keppra 500 bid.     OBJECTIVE Temp:  [98.1 F (36.7 C)-98.9 F (37.2 C)] 98.6 F (37 C) (12/14 1358) Pulse Rate:  [58-72] 72 (12/14 1358) Cardiac Rhythm: Normal sinus rhythm (12/14 0800) Resp:  [12-20] 17 (12/14 1357) BP: (110-136)/(54-89) 113/54 (12/14 1358) SpO2:  [99 %-100 %] 100 % (12/14 1358)  Recent Labs  Lab 05/16/21 0924 05/16/21 1220 05/16/21 2125 05/17/21 0908 05/17/21 1146  GLUCAP 96 76 90 85 91   Recent Labs  Lab 05/11/21 0416 05/12/21 0350 05/15/21 2129 05/15/21 2137 05/16/21 0230 05/17/21 0429  NA 136 138 139 141 138 138  K 4.4 4.2 4.0 4.0 3.7 3.4*  CL 114* 111 114* 112* 112* 110  CO2 16* 19* 19*  --  19* 21*  GLUCOSE 82 105* 139* 126* 88 77  BUN 16 22 14 16 13 16   CREATININE 1.43* 1.78* 1.54* 1.70* 1.39* 1.43*  CALCIUM 9.0 8.8* 8.9  --  9.0 8.8*   Recent Labs  Lab 05/15/21 2129  AST 21  ALT 14  ALKPHOS 125  BILITOT 0.7  PROT 6.1*  ALBUMIN 3.3*   Recent Labs  Lab 05/11/21 0416 05/12/21 0350 05/15/21 2129 05/15/21 2137 05/16/21 0230 05/17/21 0429  WBC 3.8* 4.7 5.1  --  4.9 4.2  NEUTROABS  --   --  3.0  --  2.8  --   HGB 10.2* 10.7* 10.7* 10.5* 10.1* 10.0*  HCT 29.4* 31.2* 32.1* 31.0* 30.8* 28.2*  MCV 83.1 83.4 85.1  --  84.8 82.5  PLT 316 321 348  --  328 303   No results for input(s): CKTOTAL, CKMB, CKMBINDEX, TROPONINI in the last 168 hours. Recent Labs    05/15/21 2129  LABPROT 16.4*  INR 1.3*   No results for input(s): COLORURINE, LABSPEC,  PHURINE, GLUCOSEU, HGBUR, BILIRUBINUR, KETONESUR, PROTEINUR, UROBILINOGEN, NITRITE, LEUKOCYTESUR in the last 72 hours.  Invalid input(s): APPERANCEUR     Component Value Date/Time   CHOL 82 05/11/2021 0416   TRIG 62 05/11/2021 0416   HDL 49 05/11/2021 0416   CHOLHDL 1.7 05/11/2021 0416   VLDL 12 05/11/2021 0416   LDLCALC 21 05/11/2021 0416   Lab Results  Component Value Date   HGBA1C 5.5 05/11/2021      Component Value Date/Time   LABOPIA NONE DETECTED 06/30/2020 0825   COCAINSCRNUR NONE DETECTED 06/30/2020 0825   LABBENZ NONE DETECTED 06/30/2020 0825   AMPHETMU NONE DETECTED 06/30/2020 0825   THCU NONE DETECTED 06/30/2020 0825   LABBARB NONE DETECTED 06/30/2020 0825    No results for input(s): ETH in the last 168 hours.  I have personally reviewed the radiological images below and agree with the radiology interpretations.  CT HEAD WO CONTRAST (5MM)  Result Date: 05/11/2021 CLINICAL DATA:  Follow-up examination for acute stroke. EXAM: CT HEAD WITHOUT CONTRAST TECHNIQUE: Contiguous axial images were obtained from the base of the skull through the vertex without intravenous contrast. COMPARISON:  Previous CT and MRI from 05/10/2021. FINDINGS: Brain: Previously identified acute  ischemic infarct involving the right temporal occipital junction again seen, relatively stable in size and morphology as compared to previous MRI. No evidence for hemorrhagic transformation or other complication. No significant regional mass effect. No other new or acute large vessel territory infarct. Chronic left MCA distribution infarcts again noted. Small remote right cerebellar infarct noted as well. No acute intracranial hemorrhage. No mass lesion or new midline shift. No hydrocephalus or extra-axial fluid collection. Vascular: Vascular stent in place within the left M1 segment. No hyperdense vessel. Skull: Scalp soft tissues and calvarium demonstrate no new abnormality. Sinuses/Orbits: Globes and orbital  soft tissues demonstrate no acute finding. Paranasal sinuses mastoid air cells remain clear. Other: None. IMPRESSION: 1. No significant interval change in size and morphology of acute ischemic infarct involving the right temporoccipital junction. No evidence for hemorrhagic transformation or other complication. 2. No other new acute intracranial abnormality. Electronically Signed   By: Jeannine Boga M.D.   On: 05/11/2021 02:56   CT HEAD WO CONTRAST  Result Date: 05/10/2021 CLINICAL DATA:  Neurological deficit, acute, stroke suspected. Aphasia and confusion. EXAM: CT HEAD WITHOUT CONTRAST TECHNIQUE: Contiguous axial images were obtained from the base of the skull through the vertex without intravenous contrast. COMPARISON:  02/07/2021 FINDINGS: Brain: Previous left middle cerebral artery stent with old infarction within the left middle cerebral artery territory as seen previously. There is newly seen loss of gray-white differentiation at the right temporoparietal junction. There could be acute infarction in this location. No sign of hemorrhage or mass effect. No hydrocephalus or extra-axial fluid collection. Vascular: Left MCA stent as noted above. Skull: Negative Sinuses/Orbits: Clear/normal Other: None IMPRESSION: Old left MCA territory strokes. Newly seen loss of gray-white differentiation at the right temporoparietal junction suggesting acute infarction in that region today. No hemorrhage or mass effect. Electronically Signed   By: Nelson Chimes M.D.   On: 05/10/2021 12:43   MR ANGIO HEAD WO CONTRAST  Result Date: 05/10/2021 CLINICAL DATA:  Initial evaluation for neuro deficit, stroke suspected. Abnormal CT. EXAM: MRI HEAD WITHOUT CONTRAST MRA HEAD WITHOUT CONTRAST MRA NECK WITHOUT CONTRAST TECHNIQUE: Multiplanar, multiecho pulse sequences of the brain and surrounding structures were obtained without intravenous contrast. Angiographic images of the Circle of Willis were obtained using MRA technique  without intravenous contrast. Angiographic images of the neck were obtained using MRA technique without intravenous contrast. Carotid stenosis measurements (when applicable) are obtained utilizing NASCET criteria, using the distal internal carotid diameter as the denominator. COMPARISON:  Prior head CT from earlier the same day as well as earlier studies. FINDINGS: MRI HEAD FINDINGS Brain: Cerebral volume within normal limits for age. Chronic encephalomalacia and gliosis involving the left cerebral hemisphere consistent with chronic left MCA distribution infarcts. Scattered areas of chronic hemosiderin staining and/or laminar necrosis noted throughout these areas. Small remote right cerebellar infarct noted. Underlying mild chronic small vessel ischemic disease. Abnormal restricted diffusion involving the right temporal occipital junction consistent with an acute ischemic infarct, posterior right MCA distribution (series 5, image 72). Finding corresponds with abnormality on prior CT. No associated hemorrhage or mass effect. Few additional scattered small volume foci of diffusion abnormality involving the left frontal lobe (series 5, image 84) and left periatrial region (series 5, image 73) favored to be artifactual nature related underlying laminar necrosis and/or chronic blood products. Otherwise, gray-white matter differentiation maintained. No other areas of acute infarction. No acute intracranial hemorrhage. 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 No other mass lesion. Mild right left deviation related to  chronic left cerebral volume loss. No hydrocephalus or extra-axial fluid collection. Pituitary gland and suprasellar region normal. Midline structures intact. Vascular: Major intracranial vascular flow voids maintained. Vascular stent in place within the left M1 segment. Skull and upper cervical spine: Craniocervical junction within normal limits. Bone marrow signal intensity diffusely decreased on T1  weighted sequence, nonspecific, but most commonly related to anemia, smoking or obesity. No focal marrow replacing lesion. No scalp soft tissue abnormality. Sinuses/Orbits: Globes and orbital soft tissues within normal limits. Paranasal sinuses are clear. No mastoid effusion. Other: None. MRA HEAD FINDINGS ANTERIOR CIRCULATION: Visualized distal cervical segments of the internal carotid arteries are patent with antegrade flow. Petrous segments patent bilaterally. Cavernous and supraclinoid left ICA widely patent. Short-segment approximate 50% stenosis at the supraclinoid right ICA, grossly similar to prior arteriogram (series 1027, image 10). Right M1 segment remains patent. Normal right MCA bifurcation. Probable severe proximal right M2 stenosis with associated flow gap (series 1033, image 15). This is similar as compared to prior MRA. Right MCA branches remain perfused distally, although demonstrates small vessel atheromatous irregularity. Vascular stent in place within the left M1 segment. Flow through the stent itself not evaluated by MRA. Probable short-segment severe left M2 stenosis, seen on prior MRA (series 5, image 113). Left MCA branches otherwise patent distally. A1 segments patent bilaterally. Normal anterior communicating artery complex. Anterior cerebral arteries remain widely patent. POSTERIOR CIRCULATION: Both vertebral arteries remain widely patent to the vertebrobasilar junction. Right PICA origin patent. Left PICA not seen. Basilar patent to its distal aspect without stenosis. Superior cerebellar arteries patent bilaterally. Both PCA supplied via hypoplastic P1 segments and robust bilateral posterior communicating arteries. PCAs remain widely patent to their distal aspects. No aneurysm. MRA NECK FINDINGS AORTIC ARCH: Visualized aortic arch normal caliber. Bovine branching pattern noted at the aortic arch. No hemodynamically significant stenosis about the origin of the great vessels. RIGHT CAROTID  SYSTEM: Right common and internal carotid arteries patent without stenosis or evidence for dissection. Mild for age atheromatous irregularity about the right bifurcation without stenosis. LEFT CAROTID SYSTEM: Left common and internal carotid arteries patent without stenosis or evidence for dissection. Mild for age atheromatous irregularity about the left bifurcation without stenosis. VERTEBRAL ARTERIES: Both vertebral arteries arise from subclavian arteries. Vertebral arteries are largely codominant and widely patent without stenosis or evidence for dissection. IMPRESSION: MRI HEAD: 1. Acute ischemic nonhemorrhagic infarct involving the right temporoccipital junction, posterior right MCA distribution. 2. Chronic left MCA territory infarcts, stable from prior. 3. Additional small remote right cerebellar infarct. 4. Underlying age-related cerebral atrophy with mild chronic small vessel ischemic disease. MRA HEAD: 1. Negative intracranial MRA for large vessel occlusion. 2. Approximate 50% stenosis at the supraclinoid right ICA, with probable additional short-segment severe proximal right M2 stenosis. 3. Vascular stent in place within the left M1 segment. Flow through the stent itself not evaluated by MRA, although patent flow seen distal to the stent. 4. Short-segment severe left M2 stenosis, similar to previous. 5. Wide patency of the posterior circulation. MRA NECK: Wide patency of both carotid artery systems and vertebral arteries within the neck. No hemodynamically significant stenosis or other acute vascular abnormality. Electronically Signed   By: Jeannine Boga M.D.   On: 05/10/2021 19:03   MR ANGIO NECK WO CONTRAST  Result Date: 05/10/2021 CLINICAL DATA:  Initial evaluation for neuro deficit, stroke suspected. Abnormal CT. EXAM: MRI HEAD WITHOUT CONTRAST MRA HEAD WITHOUT CONTRAST MRA NECK WITHOUT CONTRAST TECHNIQUE: Multiplanar, multiecho pulse sequences of the brain  and surrounding structures were  obtained without intravenous contrast. Angiographic images of the Circle of Willis were obtained using MRA technique without intravenous contrast. Angiographic images of the neck were obtained using MRA technique without intravenous contrast. Carotid stenosis measurements (when applicable) are obtained utilizing NASCET criteria, using the distal internal carotid diameter as the denominator. COMPARISON:  Prior head CT from earlier the same day as well as earlier studies. FINDINGS: MRI HEAD FINDINGS Brain: Cerebral volume within normal limits for age. Chronic encephalomalacia and gliosis involving the left cerebral hemisphere consistent with chronic left MCA distribution infarcts. Scattered areas of chronic hemosiderin staining and/or laminar necrosis noted throughout these areas. Small remote right cerebellar infarct noted. Underlying mild chronic small vessel ischemic disease. Abnormal restricted diffusion involving the right temporal occipital junction consistent with an acute ischemic infarct, posterior right MCA distribution (series 5, image 72). Finding corresponds with abnormality on prior CT. No associated hemorrhage or mass effect. Few additional scattered small volume foci of diffusion abnormality involving the left frontal lobe (series 5, image 84) and left periatrial region (series 5, image 73) favored to be artifactual nature related underlying laminar necrosis and/or chronic blood products. Otherwise, gray-white matter differentiation maintained. No other areas of acute infarction. No acute intracranial hemorrhage. 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 No other mass lesion. Mild right left deviation related to chronic left cerebral volume loss. No hydrocephalus or extra-axial fluid collection. Pituitary gland and suprasellar region normal. Midline structures intact. Vascular: Major intracranial vascular flow voids maintained. Vascular stent in place within the left M1 segment. Skull and upper  cervical spine: Craniocervical junction within normal limits. Bone marrow signal intensity diffusely decreased on T1 weighted sequence, nonspecific, but most commonly related to anemia, smoking or obesity. No focal marrow replacing lesion. No scalp soft tissue abnormality. Sinuses/Orbits: Globes and orbital soft tissues within normal limits. Paranasal sinuses are clear. No mastoid effusion. Other: None. MRA HEAD FINDINGS ANTERIOR CIRCULATION: Visualized distal cervical segments of the internal carotid arteries are patent with antegrade flow. Petrous segments patent bilaterally. Cavernous and supraclinoid left ICA widely patent. Short-segment approximate 50% stenosis at the supraclinoid right ICA, grossly similar to prior arteriogram (series 1027, image 10). Right M1 segment remains patent. Normal right MCA bifurcation. Probable severe proximal right M2 stenosis with associated flow gap (series 1033, image 15). This is similar as compared to prior MRA. Right MCA branches remain perfused distally, although demonstrates small vessel atheromatous irregularity. Vascular stent in place within the left M1 segment. Flow through the stent itself not evaluated by MRA. Probable short-segment severe left M2 stenosis, seen on prior MRA (series 5, image 113). Left MCA branches otherwise patent distally. A1 segments patent bilaterally. Normal anterior communicating artery complex. Anterior cerebral arteries remain widely patent. POSTERIOR CIRCULATION: Both vertebral arteries remain widely patent to the vertebrobasilar junction. Right PICA origin patent. Left PICA not seen. Basilar patent to its distal aspect without stenosis. Superior cerebellar arteries patent bilaterally. Both PCA supplied via hypoplastic P1 segments and robust bilateral posterior communicating arteries. PCAs remain widely patent to their distal aspects. No aneurysm. MRA NECK FINDINGS AORTIC ARCH: Visualized aortic arch normal caliber. Bovine branching pattern  noted at the aortic arch. No hemodynamically significant stenosis about the origin of the great vessels. RIGHT CAROTID SYSTEM: Right common and internal carotid arteries patent without stenosis or evidence for dissection. Mild for age atheromatous irregularity about the right bifurcation without stenosis. LEFT CAROTID SYSTEM: Left common and internal carotid arteries patent without stenosis or evidence for dissection.  Mild for age atheromatous irregularity about the left bifurcation without stenosis. VERTEBRAL ARTERIES: Both vertebral arteries arise from subclavian arteries. Vertebral arteries are largely codominant and widely patent without stenosis or evidence for dissection. IMPRESSION: MRI HEAD: 1. Acute ischemic nonhemorrhagic infarct involving the right temporoccipital junction, posterior right MCA distribution. 2. Chronic left MCA territory infarcts, stable from prior. 3. Additional small remote right cerebellar infarct. 4. Underlying age-related cerebral atrophy with mild chronic small vessel ischemic disease. MRA HEAD: 1. Negative intracranial MRA for large vessel occlusion. 2. Approximate 50% stenosis at the supraclinoid right ICA, with probable additional short-segment severe proximal right M2 stenosis. 3. Vascular stent in place within the left M1 segment. Flow through the stent itself not evaluated by MRA, although patent flow seen distal to the stent. 4. Short-segment severe left M2 stenosis, similar to previous. 5. Wide patency of the posterior circulation. MRA NECK: Wide patency of both carotid artery systems and vertebral arteries within the neck. No hemodynamically significant stenosis or other acute vascular abnormality. Electronically Signed   By: Jeannine Boga M.D.   On: 05/10/2021 19:03   MR BRAIN WO CONTRAST  Result Date: 05/16/2021 CLINICAL DATA:  Stroke.  Follow-up.  Intracranial stent. EXAM: MRI HEAD WITHOUT CONTRAST TECHNIQUE: Multiplanar, multiecho pulse sequences of the brain  and surrounding structures were obtained without intravenous contrast. COMPARISON:  Head CT yesterday.  MRI 05/10/2021. FINDINGS: Brain: Compared to the study of 6 days ago, there is been some extension of the acute infarction in the right posterior temporal region. Volume of infarction is increased by 50%. New punctate infarctions seen at the margins of the previous infarction. Other punctate acute infarctions at the left temporoparietal junction and left posterior frontal cortical brain are unchanged. Few punctate infarctions in the right parietal region are unchanged. Old infarction within the left MCA territory is unchanged, with some hemosiderin deposition. Old small vessel infarctions of the cerebellum as seen previously. No evidence of acute hemorrhage, hydrocephalus or extra-axial collection. Vascular: Major vessels at the base of the brain show flow. Artifact present related to the left MCA stent. 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 No evidence of hemorrhage in that region. No change in small areas of acute infarction at the left temporoparietal junction and left posterior frontal region are unchanged. Few punctate acute infarctions in the right parietal lobe are unchanged. Electronically Signed   By: Nelson Chimes M.D.   On: 05/16/2021 09:16   MR BRAIN WO CONTRAST  Result Date: 05/10/2021 CLINICAL DATA:  Initial evaluation for neuro deficit, stroke suspected. Abnormal CT. EXAM: MRI HEAD WITHOUT CONTRAST MRA HEAD WITHOUT CONTRAST MRA NECK WITHOUT CONTRAST TECHNIQUE: Multiplanar, multiecho pulse sequences of the brain and surrounding structures were obtained without intravenous contrast. Angiographic images of the Circle of  [place] were obtained using MRA technique without intravenous contrast. Angiographic images of the neck were obtained using MRA technique without intravenous contrast. Carotid stenosis measurements (when applicable) are obtained utilizing NASCET criteria, using the distal internal carotid diameter as the denominator. COMPARISON:  Prior head CT from earlier the same day as well as earlier studies. FINDINGS: MRI HEAD FINDINGS Brain: Cerebral volume within normal limits for age. Chronic encephalomalacia and gliosis involving the left cerebral hemisphere consistent with chronic left MCA distribution infarcts. Scattered areas of chronic  hemosiderin staining and/or laminar necrosis noted throughout these areas. Small remote right cerebellar infarct noted. Underlying mild chronic small vessel ischemic disease. Abnormal restricted diffusion involving the right temporal occipital junction consistent with an acute ischemic infarct, posterior right MCA distribution (series 5, image 72). Finding corresponds with abnormality on prior CT. No associated hemorrhage or mass effect. Few additional scattered small volume foci of diffusion abnormality involving the left frontal lobe (series 5, image 84) and left periatrial region (series 5, image 73) favored to be artifactual nature related underlying laminar necrosis and/or chronic blood products. Otherwise, gray-white matter differentiation maintained. No other areas of acute infarction. No acute intracranial hemorrhage. 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 No other mass lesion. Mild right left deviation related to chronic left cerebral volume loss. No hydrocephalus or extra-axial fluid collection. Pituitary gland and suprasellar region normal. Midline structures intact. Vascular: Major intracranial vascular flow voids maintained. Vascular stent in place within the left M1 segment. Skull and upper cervical spine: Craniocervical junction within normal limits. Bone marrow signal  intensity diffusely decreased on T1 weighted sequence, nonspecific, but most commonly related to anemia, smoking or obesity. No focal marrow replacing lesion. No scalp soft tissue abnormality. Sinuses/Orbits: Globes and orbital soft tissues within normal limits. Paranasal sinuses are clear. No mastoid effusion. Other: None. MRA HEAD FINDINGS ANTERIOR CIRCULATION: Visualized distal cervical segments of the internal carotid arteries are patent with antegrade flow. Petrous segments patent bilaterally. Cavernous and supraclinoid left ICA widely patent. Short-segment approximate 50% stenosis at the supraclinoid right ICA, grossly similar to prior arteriogram (series 1027, image 10). Right M1 segment remains patent. Normal right MCA bifurcation. Probable severe proximal right M2 stenosis with associated flow gap (series 1033, image 15). This is similar as compared to prior MRA. Right MCA branches remain perfused distally, although demonstrates small vessel atheromatous irregularity. Vascular stent in place within the left M1 segment. Flow through the stent itself not evaluated by MRA. Probable short-segment severe left M2 stenosis, seen on prior MRA (series 5, image 113). Left MCA branches otherwise patent distally. A1 segments patent bilaterally. Normal anterior communicating artery complex. Anterior cerebral arteries remain widely patent. POSTERIOR CIRCULATION: Both vertebral arteries remain widely patent to the vertebrobasilar junction. Right PICA origin patent. Left PICA not seen. Basilar patent to its distal aspect without stenosis. Superior cerebellar arteries patent bilaterally. Both PCA supplied via hypoplastic P1 segments and robust bilateral posterior communicating arteries. PCAs remain widely patent to their distal aspects. No aneurysm. MRA NECK FINDINGS AORTIC ARCH: Visualized aortic arch normal caliber. Bovine branching pattern noted at the aortic arch. No hemodynamically significant stenosis about the origin  of the great vessels. RIGHT CAROTID SYSTEM: Right common and internal carotid arteries patent without stenosis or evidence for dissection. Mild for age atheromatous irregularity about the right bifurcation without stenosis. LEFT CAROTID SYSTEM: Left common and internal carotid arteries patent without stenosis or evidence for dissection. Mild for age atheromatous irregularity about the left bifurcation without stenosis. VERTEBRAL ARTERIES: Both vertebral arteries arise from subclavian arteries. Vertebral arteries are largely codominant and widely patent without stenosis or evidence for dissection. IMPRESSION: MRI HEAD: 1. Acute ischemic nonhemorrhagic infarct involving the right temporoccipital junction, posterior right MCA distribution. 2. Chronic left MCA territory infarcts, stable from prior. 3. Additional small remote right cerebellar infarct. 4. Underlying age-related cerebral atrophy with mild chronic small vessel ischemic disease. MRA HEAD: 1. Negative intracranial MRA for large vessel occlusion. 2. Approximate 50% stenosis at the supraclinoid right ICA, with probable additional short-segment severe  proximal right M2 stenosis. 3. Vascular stent in place within the left M1 segment. Flow through the stent itself not evaluated by MRA, although patent flow seen distal to the stent. 4. Short-segment severe left M2 stenosis, similar to previous. 5. Wide patency of the posterior circulation. MRA NECK: Wide patency of both carotid artery systems and vertebral arteries within the neck. No hemodynamically significant stenosis or other acute vascular abnormality. Electronically Signed   By: Jeannine Boga M.D.   On: 05/10/2021 19:03   EEG adult  Result Date: 05/16/2021 Lora Havens, MD     05/16/2021 12:54 PM Patient Name: Jhoana Upham MRN: [id number] Epilepsy Attending: Lora Havens Referring Physician/Provider: Dr Amie [place] Date: 05/16/2021 Duration: 23.25 mins Patient history:  65 yo F  with hx recent CVA presenting aphasia, headache s/p syncopal event. EEG to evaluate for seizure Level of alertness: Awake AEDs during EEG study: LEV Technical aspects: This EEG study was done with scalp electrodes positioned according to the 10-20 International system of electrode placement. Electrical activity was acquired at a sampling rate of 500Hz  and reviewed with a high frequency filter of 70Hz  and a low frequency filter of 1Hz . EEG data were recorded continuously and digitally stored. Description: The posterior dominant rhythm consists of 9Hz  activity of moderate voltage (25-35 uV) seen predominantly in posterior head regions, symmetric and reactive to eye opening and eye closing.  EEG showed intermittent independent left and right temporal 2 to 3 Hz delta slowing.  Hyperventilation and photic stimulation were not performed.   ABNORMALITY -Intermittent slow, left and right temporal region IMPRESSION: This study is suggestive of cortical dysfunction arising from left and right temporal region, nonspecific etiology but most likely secondary to underlying strokes. No seizures or epileptiform discharges were seen throughout the recording. Lora Havens   EEG adult  Result Date: 05/11/2021 Lora Havens, MD     05/11/2021  1:21 PM Patient Name: Samyia Motter MRN: [id number] Epilepsy Attending: Lora Havens Referring Physician/Provider: Dr Lalla Brothers Date: 05/11/2021 Duration: 25.37 mins Patient history: 65 year old female with history of seizures and temporoparietal CVA.  EEG to evaluate for seizure. Level of alertness: Awake, asleep AEDs during EEG study: Keppra Technical aspects: This EEG study was done with scalp electrodes positioned according to the 10-20 International system of electrode placement. Electrical activity was acquired at a sampling rate of 500Hz  and reviewed with a high frequency filter of 70Hz  and a low frequency filter of 1Hz . EEG data were recorded continuously and  digitally stored. Description: The posterior dominant rhythm consists of 8-9Hz  activity of moderate voltage (25-35 uV) seen predominantly in posterior head regions, symmetric and reactive to eye opening and eye closing. Sleep was characterized by vertex waves, sleep spindles (12 to 14 Hz), maximal frontocentral region.  EEG showed intermittent generalized  3 to 6 Hz theta-delta slowing. Hyperventilation and photic stimulation were not performed.   ABNORMALITY - Intermittent slow, generalized IMPRESSION: This study is suggestive of mild diffuse encephalopathy, nonspecific etiology. No seizures or epileptiform discharges were seen throughout the recording. Lora Havens   ECHOCARDIOGRAM COMPLETE  Result Date: 05/11/2021    ECHOCARDIOGRAM REPORT   Patient Name:   [facility] Date of Exam: 05/11/2021 Medical Rec #:  [id number]            Height:       62.0 in Accession #:    [id number]           Weight:  157.0 lb Date of Birth:  1955/06/08            BSA:          1.725 m Patient Age:    86 years             BP:           123/68 mmHg Patient Gender: F                    HR:           62 bpm. Exam Location:  Inpatient Procedure: 2D Echo, 3D Echo, Cardiac Doppler and Color Doppler Indications:    Stroke  History:        Patient has no prior history of Echocardiogram examinations.                 Signs/Symptoms:Altered Mental Status; Risk Factors:Hypertension,                 Diabetes and Dyslipidemia.  Sonographer:    Roseanna Rainbow RDCS Referring Phys: [id number] [facility] A GRAY  Sonographer Comments: Patient could not follow directions. IMPRESSIONS  1. Left ventricular ejection fraction, by estimation, is 55 to 60%. Left ventricular ejection fraction by 3D volume is 58 %. The left ventricle has normal function. The left ventricle has no regional wall motion abnormalities. There is moderate asymmetric left ventricular hypertrophy of the basal-septal segment. Left ventricular diastolic parameters are consistent  with Grade I diastolic dysfunction (impaired relaxation).  2. Right ventricular systolic function is normal. The right ventricular size is normal. There is mildly elevated pulmonary artery systolic pressure. The estimated right ventricular systolic pressure is 78.2 mmHg.  3. The mitral valve is degenerative. Trivial mitral valve regurgitation. No evidence of mitral stenosis.  4. Tricuspid valve regurgitation is mild to moderate.  5. The aortic valve is tricuspid. There is mild calcification of the aortic valve. There is mild thickening of the aortic valve. Aortic valve regurgitation is not visualized. Aortic valve sclerosis/calcification is present, without any evidence of aortic stenosis.  6. The inferior vena cava is normal in size with <50% respiratory variability, suggesting right atrial pressure of 8 mmHg. Comparison(s): No prior Echocardiogram. Conclusion(s)/Recommendation(s): No intracardiac source of embolism detected on this transthoracic study. Consider a transesophageal echocardiogram to exclude cardiac source of embolism if clinically indicated. FINDINGS  Left Ventricle: Left ventricular ejection fraction, by estimation, is 55 to 60%. Left ventricular ejection fraction by 3D volume is 58 %. The left ventricle has normal function. The left ventricle has no regional wall motion abnormalities. The left ventricular internal cavity size was normal in size. There is moderate asymmetric left ventricular hypertrophy of the basal-septal segment. Left ventricular diastolic parameters are consistent with Grade I diastolic dysfunction (impaired relaxation). Right Ventricle: The right ventricular size is normal. No increase in right ventricular wall thickness. Right ventricular systolic function is normal. There is mildly elevated pulmonary artery systolic pressure. The tricuspid regurgitant velocity is 2.65  m/s, and with an assumed right atrial pressure of 8 mmHg, the estimated right ventricular systolic pressure  is 42.3 mmHg. Left Atrium: Left atrial size was normal in size. Right Atrium: Right atrial size was normal in size. Pericardium: There is no evidence of pericardial effusion. Mitral Valve: The mitral valve is degenerative in appearance. There is mild thickening of the mitral valve leaflet(s). There is mild calcification of the mitral valve leaflet(s). Mild mitral annular calcification. Trivial mitral valve regurgitation. No evidence of mitral valve stenosis.  Tricuspid Valve: The tricuspid valve is normal in structure. Tricuspid valve regurgitation is mild to moderate. Aortic Valve: The aortic valve is tricuspid. There is mild calcification of the aortic valve. There is mild thickening of the aortic valve. Aortic valve regurgitation is not visualized. Aortic valve sclerosis/calcification is present, without any evidence of aortic stenosis. Pulmonic Valve: The pulmonic valve was normal in structure. Pulmonic valve regurgitation is trivial. Aorta: The aortic root and ascending aorta are structurally normal, with no evidence of dilitation. Venous: The inferior vena cava is normal in size with less than 50% respiratory variability, suggesting right atrial pressure of 8 mmHg. IAS/Shunts: The interatrial septum is aneurysmal. No atrial level shunt detected by color flow Doppler.  LEFT VENTRICLE PLAX 2D LVIDd:         3.90 cm         Diastology LVIDs:         2.50 cm         LV e' medial:    7.18 cm/s LV PW:         1.00 cm         LV E/e' medial:  11.9 LV IVS:        1.00 cm         LV e' lateral:   14.10 cm/s LVOT diam:     2.00 cm         LV E/e' lateral: 6.1 LV SV:         84 LV SV Index:   49 LVOT Area:     3.14 cm        3D Volume EF                                LV 3D EF:    Left                                             ventricul LV Volumes (MOD)                            ar LV vol d, MOD    73.8 ml                    ejection A2C:                                        fraction LV vol d, MOD    61.8 ml                     by 3D A4C:                                        volume is LV vol s, MOD    31.3 ml                    58 %. A2C: LV vol s, MOD    25.1 ml A4C:  3D Volume EF: LV SV MOD A2C:   42.5 ml       3D EF:        58 % LV SV MOD A4C:   61.8 ml       LV EDV:       113 ml LV SV MOD BP:    41.0 ml       LV ESV:       48 ml                                LV SV:        65 ml RIGHT VENTRICLE            IVC RV S prime:     8.81 cm/s  IVC diam: 1.90 cm TAPSE (M-mode): 1.7 cm LEFT ATRIUM             Index        RIGHT ATRIUM           Index LA diam:        3.60 cm 2.09 cm/m   RA Area:     13.80 cm LA Vol (A2C):   36.5 ml 21.16 ml/m  RA Volume:   35.00 ml  20.29 ml/m LA Vol (A4C):   24.2 ml 14.03 ml/m LA Biplane Vol: 30.6 ml 17.74 ml/m  AORTIC VALVE LVOT Vmax:   124.00 cm/s LVOT Vmean:  78.600 cm/s LVOT VTI:    0.267 m  AORTA Ao Root diam: 2.70 cm Ao Asc diam:  3.15 cm MITRAL VALVE                TRICUSPID VALVE MV Area (PHT): 2.95 cm     TR Peak grad:   28.1 mmHg MV Decel Time: 257 msec     TR Vmax:        265.00 cm/s MV E velocity: 85.40 cm/s MV A velocity: 115.00 cm/s  SHUNTS MV E/A ratio:  0.74         Systemic VTI:  0.27 m                             Systemic Diam: 2.00 cm Gwyndolyn Kaufman MD Electronically signed by Gwyndolyn Kaufman MD Signature Date/Time: 05/11/2021/2:44:05 PM    Final    CT HEAD CODE STROKE WO CONTRAST  Result Date: 05/15/2021 CLINICAL DATA:  Code stroke. EXAM: CT HEAD WITHOUT CONTRAST TECHNIQUE: Contiguous axial images were obtained from the base of the skull through the vertex without intravenous contrast. COMPARISON:  05/11/2021. FINDINGS: Brain: Slightly increased hypodensity in the right posterior temporal lobe and lateral occipital lobe, consistent with evolution of a previously noted acute infarct, without significant increase in size or evidence of acute hemorrhage. Additional more chronic left frontal, temporal, occipital, and parietal encephalomalacia  is also unchanged. Remote right cerebellar infarct. No acute hemorrhage, mass, mass effect, or midline shift. No definite new area of infarction. No extra-axial collection or hydrocephalus. Vascular: Redemonstrated left M1 stent.  No hyperdense vessel. Skull: Normal. Negative for fracture or focal lesion. Sinuses/Orbits: No acute finding. Other: The mastoids are well aerated. ASPECTS [facility] Stroke Program Early CT Score) - Ganglionic level infarction (caudate, lentiform nuclei, internal capsule, insula, M1-M3 cortex): 7 - Supraganglionic infarction (M4-M6 cortex): 3 Total score (0-10 with 10 being normal): 10 IMPRESSION: 1. No acute intracranial process. 2. Slightly increased hypodensity in  the right posterior temporal lobe and lateral occipital lobe, consistent with evolution of previously noted acute infarct. 3. ASPECTS is 10 Code stroke imaging results were communicated on 05/15/2021 at 9:47 pm to provider OSIAS via secure text paging. Electronically Signed   By: Merilyn Baba M.D.   On: 05/15/2021 21:49     PHYSICAL EXAM  Temp:  [98.1 F (36.7 C)-98.9 F (37.2 C)] 98.6 F (37 C) (12/14 1358) Pulse Rate:  [58-72] 72 (12/14 1358) Resp:  [12-20] 17 (12/14 1357) BP: (110-136)/(54-89) 113/54 (12/14 1358) SpO2:  [99 %-100 %] 100 % (12/14 1358)  General - Well nourished, well developed, in no apparent distress  Ophthalmologic - fundi not visualized due to noncooperation.  Cardiovascular - Regular rhythm and rate.  Neuro - awake, alert, eyes open, expressive aphasia with word salad, however, able to follow most simple peripheral and midline commands. No gaze palsy, tracking bilaterally, blinking to visual threat bilaterally, PERRL. No significant facial droop. Tongue midline. Bilateral UEs 5/5, no drift. Bilaterally LEs 5/5, no drift. Sensation, coordination not cooperative and gait not tested.   ASSESSMENT/PLAN Ms. Airi Copado is a 65 y.o. 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    Stroke:  right ICA infarct extended from last admission, likely hypotension / syncope in the setting of right tICA and M2 stenosis.  CT head - Slightly increased hypodensity in the right posterior temporal lobe and lateral occipital lobe, consistent with evolution of previously noted acute infarct. MRI  Extension of the acute infarction in the right MCA territory at the right temporoparietal junction, particularly along the inferior margin of the infarction and with a few tiny punctate satellite foci additionally present. MRA 12/7 - Approximate 50% stenosis at the supraclinoid right ICA, with probable additional short-segment severe proximal right M2 stenosis.  eliquis for VTE prophylaxis Brilinta (ticagrelor) 90 mg bid and Eliquis (apixaban) daily prior to admission, now on aspirin 81 mg daily and Eliquis (apixaban) daily.  Ongoing aggressive stroke risk factor management Therapy recommendations:  outpt PT/OT Disposition:  pending  ? Seizure  Per brother, pt had LOC episode at home 1.79min. Now back to baseline.  12/13 EEG cortical dysfunction arising from left and right temporal region, no seizure. 12/14 RN reported staring off to the ceiling for 1-2 min and not responding, reportedly confused for a few minutes after this but was back to baseline by the time patient was evaluated by resident.  Will increase keppra to 1g bid, with supplement of 500mg  stat.   Hx of stroke and seizure 06/2020, patient admitted for confusion and seizure-like activity, left gaze and right-sided weakness.  CT showed left MCA early ischemic changes.  CTA head neck showed left M1 occlusion, severe right ACA origin and moderate right M2 stenosis.  Status post left MCA thrombectomy and MCA stenting.  EF 60 to 65%, DVT negative, loop recorder placed.   EEG showed intermittent delta activity left frontal region, put on Keppra.  A1c 5.4, LDL 68, discharged with aspirin Brilinta and Lipitor 80.   Over time, she follow-up with [facility] and also speech therapy.  Per brother, patient recovered well, walking independently, able to communicate without difficulty.  Loop recorder found to have A. fib in 10/2020, she was put on Eliquis.   ED visit in 02/2021 some tonic-clonic jerking that lasted 5 to 6 minutes and appeared  somewhat confused afterwards.  She then had loss of bowel control. In ED, she is back to baseline, keppra restarted. 05/10/2021 admitted for slurred speech, confusion, and headache.  CT head old left MCA infarct however new right temporal parietal infarct.  MRI confirmed right MCA temporal occipital junction moderate infarct.  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 EEG mild diffuse encephalopathy. EF 55-60%, LDL 21 and A1C 5.5. Discharged with eliquis and brilinta.  Hypotension Hx of hypertension  Stable now with episode of low BP 90s On IVF Orthostatic vital unremarkable Long term BP goal normotensive TED hose added  Hyperlipidemia Home meds:  lipitor 80  LDL 21, goal < 70 Now decrease lipitor to 40 Continue statin at discharge  Other Stroke Risk Factors Advanced age obesity status post gastric bypass in 05/2020  Other Active Problems CKD III a - Cre 1.39  Hospital day # 1  Neurology will sign off. Please call with questions. Pt will follow up with stroke clinic NP at [facility] on 06/29/21. Thanks for the consult.   Rosalin Hawking, MD PhD Stroke Neurology 05/17/2021 2:40 PM    To contact Stroke Continuity provider, please refer to [email or web address] After hours, contact General Neurology

## 2021-05-18 ENCOUNTER — Other Ambulatory Visit (HOSPITAL_COMMUNITY): Payer: Self-pay

## 2021-05-18 LAB — GLUCOSE, CAPILLARY
Glucose-Capillary: 122 mg/dL — ABNORMAL HIGH (ref 70–99)
Glucose-Capillary: 85 mg/dL (ref 70–99)

## 2021-05-18 LAB — CBC
HCT: 29.5 % — ABNORMAL LOW (ref 36.0–46.0)
Hemoglobin: 10.3 g/dL — ABNORMAL LOW (ref 12.0–15.0)
MCH: 28.8 pg (ref 26.0–34.0)
MCHC: 34.9 g/dL (ref 30.0–36.0)
MCV: 82.4 fL (ref 80.0–100.0)
Platelets: 328 10*3/uL (ref 150–400)
RBC: 3.58 MIL/uL — ABNORMAL LOW (ref 3.87–5.11)
RDW: 15.1 % (ref 11.5–15.5)
WBC: 4.6 10*3/uL (ref 4.0–10.5)
nRBC: 0 % (ref 0.0–0.2)

## 2021-05-18 LAB — BASIC METABOLIC PANEL
Anion gap: 8 (ref 5–15)
BUN: 17 mg/dL (ref 8–23)
CO2: 21 mmol/L — ABNORMAL LOW (ref 22–32)
Calcium: 9 mg/dL (ref 8.9–10.3)
Chloride: 109 mmol/L (ref 98–111)
Creatinine, Ser: 1.35 mg/dL — ABNORMAL HIGH (ref 0.44–1.00)
GFR, Estimated: 43 mL/min — ABNORMAL LOW (ref >60)
Glucose, Bld: 82 mg/dL (ref 70–99)
Potassium: 3.6 mmol/L (ref 3.5–5.1)
Sodium: 138 mmol/L (ref 135–145)

## 2021-05-18 MED ORDER — LEVETIRACETAM 1000 MG PO TABS
1000.0000 mg | ORAL_TABLET | Freq: Two times a day (BID) | ORAL | 1 refills | Status: DC
  Filled 2021-05-18: qty 60, 30d supply, fill #0

## 2021-05-18 NOTE — Progress Notes (Signed)
Pt has been discharged from the unit via wheelchair. AVS has been given and all belongings has been sent. Pt sent in good spirits.

## 2021-05-18 NOTE — Care Management Important Message (Signed)
Important Message  Patient Details  Name: Courtney Burke MRN: [id number] Date of Birth: 1955-10-15   Medicare Important Message Given:  Yes     Orbie Pyo 05/18/2021, 2:21 PM

## 2021-05-18 NOTE — Plan of Care (Signed)

## 2021-05-18 NOTE — Discharge Summary (Signed)
Name: Aysa Larivee MRN: [id number] DOB: 05-20-1956 65 y.o. PCP: Stann Ore  Date of Admission: 05/15/2021  9:29 PM Date of Discharge:   05/18/21 Attending Physician: Axel Filler, *  Discharge Diagnosis: 1. 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 Paroxysmal atrial fibrillation 3. Stage IIIb chronic kidney disease 4. Type 2 DM 5. Chronic normocytic anemia 6. GERD 7. Breast lumps 8. Hematochezia 9. Seizures  Discharge Medications: Allergies as of 05/18/2021       Reactions   Iodinated Diagnostic Agents Itching   Doxycycline Nausea And Vomiting   Sulfa Antibiotics Rash, Hives   Codeine Hives, Swelling   Swollen tongue   Hydrocodone Bit-homatrop Mbr Itching   Ace Inhibitors Cough, Itching, Rash        Medication List     STOP taking these medications    diphenhydrAMINE 50 MG tablet Commonly known as: BENADRYL   ticagrelor 90 MG Tabs tablet Commonly known as: BRILINTA   Vitamin D3 25 MCG tablet Commonly known as: Vitamin D       TAKE these medications    acetaminophen 500 MG tablet Commonly known as: TYLENOL Take 1,000 mg by mouth every 6 (six) hours as needed for moderate pain or headache.   aspirin 81 MG EC tablet Take 1 tablet (81 mg total) by mouth daily. Swallow whole.   atorvastatin 40 MG tablet Commonly known as: LIPITOR Take 1 tablet (40 mg total) by mouth at bedtime. What changed:  medication strength how much to take   Eliquis 5 MG Tabs tablet Generic drug: apixaban TAKE 1 TABLET BY MOUTH TWICE A DAY What changed: how much to take   levETIRAcetam 1000 MG tablet Commonly known as: Keppra Take 1 tablet (1,000 mg total) by mouth 2 (two) times daily. What changed:  medication strength how much to take   multivitamin with minerals Tabs tablet Take 1 tablet by mouth daily.   omeprazole 40 MG capsule Commonly known as: PRILOSEC Take  40 mg by mouth daily.   ursodiol 250 MG tablet Commonly known as: ACTIGALL Take 250 mg by mouth 2 (two) times daily.        Disposition and follow-up:   Ms.Rubyann Nikkita Adeyemi was discharged from [facility] in Stable condition.  At the hospital follow up visit please address:  1.  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 Breast lumps- make sure patient was able to reschedule mammogram   3. Hematochezia- make sure patient was able to reschedule colonoscopy.   4. CKD- recheck creatinine  5. Seizures- patient had an unresponsive episode in the hospital that was presumed to be a seizure. Her keppra was increased to 1000 BID. Make sure she has neurology follow up scheduled/is able to make her appointment  6.  Labs / imaging needed at time of follow-up: bmp   7.  Pending labs/ test needing follow-up: none  Follow-up Appointments:  Follow-up Information     [facility]. Schedule an appointment as soon as possible for a visit.   Contact information: [phone]        Frann Rider, NP. Go on 06/29/2021.   Specialty: Neurology Why: stroke clinic Contact information: [address] [place] [address] [phone]  Hospital Course by problem list: 1. 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 MRI of her brain showed extension of her prior CVA which neurology thought was related to a hypotensive event likely in the setting of poor PO intake and the vomiting. Luckily she had no further deficits or symptoms from the stroke progression and was seen by PT who still recommended HH. Patient's labs were otherwise unremarkable  and her WBC was WNL. She did not have signs of an infection to suggest another cause for the hypotension. She was given IV fluids and was encouraged to increase her PO intake as an outpatient. She will discharge on aspirin 81 and eliquis per neurology. She will continue [facility] PT/OT and speech therapy.  As patient was being discharged on 12/14 she had an episode of unresponsiveness with gaze deviation but was back at baseline by the time she was evaluated by providers. Neurology did not feel that further imaging or EEG would be beneficial given her prior studies had been normal and she had no new focal deficits. Her keppra was increased to 1000 BID and she will follow up with neurology as an outpatient.   2. Paroxysmal atrial fibrillation She was in normal sinus rhythm during admission. Continued on home eliquis   3. Stage IIIb chronic kidney disease Creatinine was slightly elevated on admission, improved back to baseline after some fluids (1.3-1.4)   4. Type 2 DM Hemoglobin A1c on 12/8 was 5.5.  Currently not on any diabetic medications.  Blood sugars were well controlled during admission.   5. Chronic normocytic anemia Hemoglobin stable.  No signs or symptoms of bleed. Required no transfusions   6. GERD Continued home protonix 40 mg   7. Breast lumps Patient complains of multiple breast lumps in her right breast, 2 of which were appreciated on exam.  1 was midline slightly closer to the right breast at approximately 3 o'clock position and nontender to palpation.  The second 1 was along the inferior margin of her right breast at the approximate 5 o'clock position.  No skin changes noted.  Patient is due for a breast mammogram however this was not done because she was admitted into the hospital.  Will need to arrange for a diagnostic mammogram.   8. Hematochezia Patient reports 2 episodes of bright red blood in her stool over the past few months.  She was scheduled to have a colonoscopy however  this was not done due to her being in the hospital.  Will need to reschedule  Discharge Exam:   BP 115/69 (BP Location: Right Arm)    Pulse (!) 59    Temp 98.1 F (36.7 C) (Oral)    Resp 18    LMP 09/28/2012    SpO2 99%  Discharge exam:  Gen: Elderly woman resting comfortably in bed CV: RRR no m/r/g Resp: normal WOB on room air Abd: soft non-tender nondistended Neuro: alert, oriented, at her baseline Skin: warm and dry Psych: normal affect  Pertinent Labs, Studies, and Procedures:   MR BRAIN WO CONTRAST  Result Date: 05/16/2021 CLINICAL DATA:  Stroke.  Follow-up.  Intracranial stent. EXAM: MRI HEAD WITHOUT CONTRAST TECHNIQUE: Multiplanar, multiecho pulse sequences of the brain and surrounding structures were obtained without intravenous contrast. COMPARISON:  Head CT yesterday.  MRI 05/10/2021. FINDINGS: Brain: Compared to the study of 6 days ago, there is  been some extension of the acute infarction in the right posterior temporal region. Volume of infarction is increased by 50%. New punctate infarctions seen at the margins of the previous infarction. Other punctate acute infarctions at the left temporoparietal junction and left posterior frontal cortical brain are unchanged. Few punctate infarctions in the right parietal region are unchanged. Old infarction within the left MCA territory is unchanged, with some hemosiderin deposition. Old small vessel infarctions of the cerebellum as seen previously. No evidence of acute hemorrhage, hydrocephalus or extra-axial collection. Vascular: Major vessels at the base of the brain show flow. Artifact present related to the left MCA stent. 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 No  evidence of hemorrhage in that region. No change in small areas of acute infarction at the left temporoparietal junction and left posterior frontal region are unchanged. Few punctate acute infarctions in the right parietal lobe are unchanged. Electronically Signed   By: Nelson Chimes M.D.   On: 05/16/2021 09:16   EEG adult  Result Date: 05/16/2021 Lora Havens, MD     05/16/2021 12:54 PM Patient Name: Axel Meas MRN: [id number] Epilepsy Attending: Lora Havens Referring Physician/Provider: Dr Amie [place] Date: 05/16/2021 Duration: 23.25 mins Patient history:  65 yo F with hx recent CVA presenting aphasia, headache s/p syncopal event. EEG to evaluate for seizure Level of alertness: Awake AEDs during EEG study: LEV Technical aspects: This EEG study was done with scalp electrodes positioned according to the 10-20 International system of electrode placement. Electrical activity was acquired at a sampling rate of 500Hz  and reviewed with a high frequency filter of 70Hz  and a low frequency filter of 1Hz . EEG data were recorded continuously and digitally stored. Description: The posterior dominant rhythm consists of 9Hz  activity of moderate voltage (25-35 uV) seen predominantly in posterior head regions, symmetric and reactive to eye opening and eye closing.  EEG showed intermittent independent left and right temporal 2 to 3 Hz delta slowing.  Hyperventilation and photic stimulation were not performed.   ABNORMALITY -Intermittent slow, left and right temporal region IMPRESSION: This study is suggestive of cortical dysfunction arising from left and right temporal region, nonspecific etiology but most likely secondary to underlying strokes. No seizures or epileptiform discharges were seen throughout the recording. Lora Havens   CT HEAD CODE STROKE WO CONTRAST  Result Date: 05/15/2021 CLINICAL DATA:  Code stroke. EXAM: CT HEAD WITHOUT CONTRAST TECHNIQUE: Contiguous axial images were obtained  from the base of the skull through the vertex without intravenous contrast. COMPARISON:  05/11/2021. FINDINGS: Brain: Slightly increased hypodensity in the right posterior temporal lobe and lateral occipital lobe, consistent with evolution of a previously noted acute infarct, without significant increase in size or evidence of acute hemorrhage. Additional more chronic left frontal, temporal, occipital, and parietal encephalomalacia is also unchanged. Remote right cerebellar infarct. No acute hemorrhage, mass, mass effect, or midline shift. No definite new area of infarction. No extra-axial collection or hydrocephalus. Vascular: Redemonstrated left M1 stent.  No hyperdense vessel. Skull: Normal. Negative for fracture or focal lesion. Sinuses/Orbits: No acute finding. Other: The mastoids are well aerated. ASPECTS [facility] Stroke Program Early CT Score) - Ganglionic level infarction (caudate, lentiform nuclei, internal capsule, insula, M1-M3 cortex): 7 - Supraganglionic infarction (M4-M6 cortex): 3 Total score (  0-10 with 10 being normal): 10 IMPRESSION: 1. No acute intracranial process. 2. Slightly increased hypodensity in the right posterior temporal lobe and lateral occipital lobe, consistent with evolution of previously noted acute infarct. 3. ASPECTS is 10 Code stroke imaging results were communicated on 05/15/2021 at 9:47 pm to provider OSIAS via secure text paging. Electronically Signed   By: Merilyn Baba M.D.   On: 05/15/2021 21:49    CBC Latest Ref Rng & Units 05/18/2021 05/17/2021 05/16/2021  WBC 4.0 - 10.5 K/uL 4.6 4.2 4.9  Hemoglobin 12.0 - 15.0 g/dL 10.3(L) 10.0(L) 10.1(L)  Hematocrit 36.0 - 46.0 % 29.5(L) 28.2(L) 30.8(L)  Platelets 150 - 400 K/uL 328 303 328    BMP Latest Ref Rng & Units 05/18/2021 05/17/2021 05/16/2021  Glucose 70 - 99 mg/dL 82 77 88  BUN 8 - 23 mg/dL 17 16 13   Creatinine 0.44 - 1.00 mg/dL 1.35(H) 1.43(H) 1.39(H)  Sodium 135 - 145 mmol/L 138 138 138  Potassium 3.5 - 5.1  mmol/L 3.6 3.4(L) 3.7  Chloride 98 - 111 mmol/L 109 110 112(H)  CO2 22 - 32 mmol/L 21(L) 21(L) 19(L)  Calcium 8.9 - 10.3 mg/dL 9.0 8.8(L) 9.0    Discharge Instructions: Discharge Instructions     Ambulatory referral to Occupational Therapy   Complete by: As directed    Ambulatory referral to Physical Therapy   Complete by: As directed    Ambulatory referral to Speech Therapy   Complete by: As directed    Call MD for:  difficulty breathing, headache or visual disturbances   Complete by: As directed    Call MD for:  persistant dizziness or light-headedness   Complete by: As directed    Call MD for:  persistant nausea and vomiting   Complete by: As directed    Diet - low sodium heart healthy   Complete by: As directed    Increase activity slowly   Complete by: As directed    Increase activity slowly   Complete by: As directed        Signed: [name], MD 05/18/2021, 9:30 AM   Pager: [phone]

## 2021-05-18 NOTE — Progress Notes (Signed)
°   05/18/21 1100  Clinical Encounter Type  Visited With Patient and family together  Visit Type Initial;Psychological support;Spiritual support;Social support  Referral From Patient;Nurse    [facility] visited pt. per [facility] consult for support and prayer.  Pt. lying in bed when [place] arrived, seeming to be in good spirits.  Pt.'s speech was very clear but disorganized and it seemed apparent that she was attempting to communicate ideas that were clear in her head but that she was struggling to put into words.  Pt. called her daughter on FaceTime to help explain situation; dtr. shared that pt. suffered a stroke in January of 2022 and had been doing PT/OT/ST most of the past year in recovering from this; pt. suffered two additional strokes (or perhaps seizures) in the past month.  Dtr. says pt. is currently being kept at [facility] for observation.  Dtr. says pt. occasionally has episodes in which she gets overheated and begins to sweat and then soon after has a stroke-like response; dtr. feels strongly that if pt. can be calmed in these moments the subsequent bad reaction can be averted.  Pt. requested prayer for healing and for a deeper sense of God's love for herself and those around her.  Chaplains remain available as needed or requested.

## 2021-05-23 NOTE — Patient Outreach (Signed)
Received an [facility] Stroke program [employer] notification. I have assigned Joellyn Quails, RN to outreach and determine if there are any Case Management needs.

## 2021-05-24 ENCOUNTER — Other Ambulatory Visit: Payer: Self-pay | Admitting: *Deleted

## 2021-05-24 ENCOUNTER — Encounter: Payer: Self-pay | Admitting: *Deleted

## 2021-05-24 NOTE — Patient Outreach (Signed)
[place] [facility]) Care Management Telephonic RN Care Manager Note   05/24/2021 Name:  Courtney Burke MRN:  [id number] DOB:  Jan 16, 1956  Summary: [facility] outreach for EMMI- stroke RED ON Courtney Burke Day #   9   Date: Monday 12/ Red Alert Reason: Lost interest in things they used to enjoy? Yes Sad, hopeless, anxious, or empty? Yes Insurance: united healthcare medicare  [facility] admissions x 2 ED visits x 2 in the last 6 months    [facility] RN CM reviewed [facility] services with patient and daughter as patient has some residual speech deficits after her right MCA stroke.  Patient gave verbal consent for services [facility] telephonic RN CM and for all [facility] staff to speak with her daughter Courtney Burke from 05/24/21 forward about all  medical concerns RN Cm reviewed HIPAA, Designated Party Release (DPR) forms and POA  Advised patient that there will be further automated EMMI- post discharge calls to assess how the patient is doing following the recent hospitalization Advised the patient that another call may be received from a nurse if any of their responses were abnormal. Patient voiced understanding and was appreciative of f/u call. Advance Directives: has Power of Attorney [facility]) forms but has not completed yet   Courtney Burke shared her observed signs that Courtney Burke has been in distress like a history of getting frequent headaches, hot flashes, break out in a sweat (a history of stent placement) Patient and her daughter expressed some of Courtney Burke medical symptoms leading to both her strokes to medical providers but expresses feelings of "not being heard" by medical staff.  Allowed daughter Courtney Burke to ventilate her feelings  Walking fine, dressing independently    Recommendations/Changes made from today's visit: Assisted with outreach to [facility] [phone] to inquire about location of 05/25/21 speech therapy appointment for 0930   Sent an in basket message to speech  therapist  Subjective: Courtney Burke is an 65 y.o. year old female who is a primary patient of [name], PA-C. The care management team was consulted for assistance with care management and/or care coordination needs.    Telephonic RN Care Manager completed Telephone Visit today.   Objective: Medications Reviewed Today     Reviewed by Lannette Donath, CPhT (Pharmacy Technician) on 05/15/21 at 2307  Med List Status: Complete   Medication Order Taking? Sig Documenting Provider Last Dose Status Informant  acetaminophen (TYLENOL) 500 MG tablet [id number] Yes Take 1,000 mg by mouth every 6 (six) hours as needed for moderate pain or headache. [provider] 05/15/2021 Active Child  atorvastatin (LIPITOR) 80 MG tablet [id number] Yes Take 1 tablet (80 mg total) by mouth at bedtime. Cathlyn Parsons, PA-C 05/14/2021 Active Child           Med Note Gar Ponto   Thu Sep 08, 2020  4:02 PM)    diphenhydrAMINE (BENADRYL) 50 MG tablet [id number] No Take 1 tablet (50 mg total) by mouth at bedtime as needed for itching. Take Benadryl 50 mg at 7:30 am on 8/9 (with Prednisone 50 mg.)  Patient not taking: Reported on 05/10/2021   [place], Armando Gang, PA-C Not Taking Active Child  ELIQUIS 5 MG TABS tablet [id number] Yes TAKE 1 TABLET BY MOUTH TWICE A DAY  Patient taking differently: Take 5 mg by mouth 2 (two) times daily.   [name], MD 05/15/2021 0700 Active Child  levETIRAcetam (KEPPRA) 500 MG tablet [id number] Yes Take 1 tablet (500 mg total)  by mouth 2 (two) times daily. Frann Rider, NP 05/15/2021 Active Child  Multiple Vitamin (MULTIVITAMIN WITH MINERALS) TABS tablet [id number] Yes Take 1 tablet by mouth daily. [provider] 05/15/2021 Active Child  omeprazole (PRILOSEC) 40 MG capsule [id number] Yes Take 40 mg by mouth daily. [provider] 05/15/2021 Active Child  ticagrelor (BRILINTA) 90 MG TABS tablet [id number] Yes Take 1 tablet (90 mg total)  by mouth 2 (two) times daily. [name], MD 05/15/2021 Active Child           Med Note [place] Crane, MISTY D   Mon May 15, 2021 11:04 PM)    ursodiol (ACTIGALL) 250 MG tablet [id number] Yes Take 250 mg by mouth 2 (two) times daily. [provider] 05/15/2021 Active Child  Vitamin D3 (VITAMIN D) 25 MCG tablet [id number] No TAKE 1 TABLET (1,000 UNITS TOTAL) BY MOUTH DAILY BEFORE BREAKFAST.  Patient not taking: Reported on 05/10/2021   Cathlyn Parsons, PA-C Not Taking Active Child            Patient Active Problem List   Diagnosis Date Noted   Syncope 05/16/2021   Breast lump 05/16/2021   Seizure ([place]) 05/16/2021   Acute ischemic right MCA stroke ([place]) 05/11/2021   CVA (cerebral vascular accident) ([place]) 05/11/2021   Acute cerebral infarction ([place]) 05/10/2021   Dyslipidemia    Stage 3b chronic kidney disease ([place])    Dysphagia, post-stroke    Diabetes mellitus type 2 in obese ([place])    Gallstones 08/18/2019   Esophagitis 07/20/2019   Erosive gastritis 07/20/2019   Chronic bilateral thoracic back pain 06/05/2018   Age-related nuclear cataract of both eyes 09/28/2015   Asthma 09/28/2015   Essential hypertension 09/28/2015   Essential thrombocytosis ([place]) 09/28/2015   Gastroesophageal reflux disease without esophagitis 09/28/2015   Keratoconjunctivitis sicca of both eyes not specified as Sjogren's 09/28/2015   Status post gastric bypass for obesity 09/28/2015   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   Morbid obesity ([place]) 05/21/2013   Hyperlipidemia, unspecified 03/16/2013          SDOH:  (Social Determinants of Health) assessments and interventions performed:  SDOH Interventions    Flowsheet Row Most Recent Value  SDOH Interventions   Food Insecurity Interventions Intervention Not Indicated  Financial Strain Interventions Intervention Not Indicated  Housing Interventions Intervention Not Indicated  Intimate Partner Violence Interventions Intervention  Not Indicated  Stress Interventions Intervention Not Indicated  Social Connections Interventions Intervention Not Indicated  Transportation Interventions Intervention Not Indicated       Care Plan  Review of patient past medical history, allergies, medications, health status, including review of consultants reports, laboratory and other test data, was performed as part of comprehensive evaluation for care management services.   There are no care plans that you recently modified to display for this patient.    Plan: The patient has been provided with contact information for the care management team and has been advised to call with any health related questions or concerns.  The care management team will reach out to the patient again over the next 30 business days.  Meylin Stenzel L. Lavina Hamman, RN, BSN, [place] Coordinator Office number [phone] [facility] number [phone] Fax number [phone]

## 2021-05-30 ENCOUNTER — Other Ambulatory Visit: Payer: Self-pay | Admitting: *Deleted

## 2021-05-30 ENCOUNTER — Other Ambulatory Visit: Payer: Self-pay
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
## 2021-06-01 ENCOUNTER — Encounter (HOSPITAL_COMMUNITY): Payer: Self-pay

## 2021-06-01 ENCOUNTER — Emergency Department (HOSPITAL_COMMUNITY): Payer: Medicare Other

## 2021-06-01 ENCOUNTER — Emergency Department (HOSPITAL_COMMUNITY)
Admission: EM | Admit: 2021-06-01 | Discharge: 2021-06-01 | Disposition: A | Discharge status: Home / Self Care | Payer: Medicare Other | Attending: Emergency Medicine | Admitting: Emergency Medicine

## 2021-06-01 ENCOUNTER — Other Ambulatory Visit: Payer: Self-pay

## 2021-06-01 DIAGNOSIS — U071 COVID-19: Secondary | ICD-10-CM | POA: Insufficient documentation

## 2021-06-01 DIAGNOSIS — Z7901 Long term (current) use of anticoagulants: Secondary | ICD-10-CM | POA: Diagnosis not present

## 2021-06-01 DIAGNOSIS — I6523 Occlusion and stenosis of bilateral carotid arteries: Secondary | ICD-10-CM | POA: Diagnosis not present

## 2021-06-01 DIAGNOSIS — R4182 Altered mental status, unspecified: Secondary | ICD-10-CM | POA: Diagnosis not present

## 2021-06-01 DIAGNOSIS — J45909 Unspecified asthma, uncomplicated: Secondary | ICD-10-CM | POA: Insufficient documentation

## 2021-06-01 DIAGNOSIS — N1832 Chronic kidney disease, stage 3b: Secondary | ICD-10-CM | POA: Insufficient documentation

## 2021-06-01 DIAGNOSIS — R4781 Slurred speech: Secondary | ICD-10-CM | POA: Diagnosis not present

## 2021-06-01 DIAGNOSIS — R55 Syncope and collapse: Secondary | ICD-10-CM | POA: Insufficient documentation

## 2021-06-01 DIAGNOSIS — E119 Type 2 diabetes mellitus without complications: Secondary | ICD-10-CM | POA: Diagnosis not present

## 2021-06-01 DIAGNOSIS — Z7982 Long term (current) use of aspirin: Secondary | ICD-10-CM | POA: Insufficient documentation

## 2021-06-01 DIAGNOSIS — R42 Dizziness and giddiness: Secondary | ICD-10-CM | POA: Insufficient documentation

## 2021-06-01 DIAGNOSIS — I129 Hypertensive chronic kidney disease with stage 1 through stage 4 chronic kidney disease, or unspecified chronic kidney disease: Secondary | ICD-10-CM | POA: Diagnosis not present

## 2021-06-01 DIAGNOSIS — R519 Headache, unspecified: Secondary | ICD-10-CM | POA: Diagnosis present

## 2021-06-01 LAB — CBC WITH DIFFERENTIAL/PLATELET
Abs Immature Granulocytes: 0 10*3/uL (ref 0.00–0.07)
Basophils Absolute: 0 10*3/uL (ref 0.0–0.1)
Basophils Relative: 0 %
Eosinophils Absolute: 0.1 10*3/uL (ref 0.0–0.5)
Eosinophils Relative: 3 %
HCT: 33.7 % — ABNORMAL LOW (ref 36.0–46.0)
Hemoglobin: 10.8 g/dL — ABNORMAL LOW (ref 12.0–15.0)
Immature Granulocytes: 0 %
Lymphocytes Relative: 35 %
Lymphs Abs: 1 10*3/uL (ref 0.7–4.0)
MCH: 28.1 pg (ref 26.0–34.0)
MCHC: 32 g/dL (ref 30.0–36.0)
MCV: 87.5 fL (ref 80.0–100.0)
Monocytes Absolute: 0.2 10*3/uL (ref 0.1–1.0)
Monocytes Relative: 8 %
Neutro Abs: 1.6 10*3/uL — ABNORMAL LOW (ref 1.7–7.7)
Neutrophils Relative %: 54 %
Platelets: 302 10*3/uL (ref 150–400)
RBC: 3.85 MIL/uL — ABNORMAL LOW (ref 3.87–5.11)
RDW: 15.2 % (ref 11.5–15.5)
WBC: 3 10*3/uL — ABNORMAL LOW (ref 4.0–10.5)
nRBC: 0 % (ref 0.0–0.2)

## 2021-06-01 LAB — BASIC METABOLIC PANEL
Anion gap: 8 (ref 5–15)
BUN: 11 mg/dL (ref 8–23)
CO2: 21 mmol/L — ABNORMAL LOW (ref 22–32)
Calcium: 8.8 mg/dL — ABNORMAL LOW (ref 8.9–10.3)
Chloride: 109 mmol/L (ref 98–111)
Creatinine, Ser: 1.44 mg/dL — ABNORMAL HIGH (ref 0.44–1.00)
GFR, Estimated: 40 mL/min — ABNORMAL LOW (ref >60)
Glucose, Bld: 95 mg/dL (ref 70–99)
Potassium: 4.2 mmol/L (ref 3.5–5.1)
Sodium: 138 mmol/L (ref 135–145)

## 2021-06-01 LAB — RESP PANEL BY RT-PCR (FLU A&B, COVID) ARPGX2
Influenza A by PCR: NEGATIVE
Influenza B by PCR: NEGATIVE
SARS Coronavirus 2 by RT PCR: POSITIVE — AB

## 2021-06-01 LAB — TROPONIN I (HIGH SENSITIVITY): Troponin I (High Sensitivity): 6 ng/L (ref <18)

## 2021-06-01 IMAGING — MR MR MRA HEAD W/O CM
2 series · 17 of 48 positions shown · IV contrast (gadavist)
Comparison: Head CT [DATE]. Head MRI [DATE]. Head and neck
MRA [DATE].

CLINICAL DATA: Stroke, follow-up.  Vertigo, central.

EXAM:
MRI HEAD WITHOUT CONTRAST
MRA HEAD WITHOUT CONTRAST
MRA OF THE NECK WITHOUT AND WITH CONTRAST
TECHNIQUE: Multiplanar, multi-echo pulse sequences of the brain and surrounding
structures were acquired without intravenous contrast. Angiographic
images of the Circle of Willis were acquired using MRA technique
without intravenous contrast. Angiographic images of the neck were
acquired using MRA technique without and with intravenous contrast.
Carotid stenosis measurements (when applicable) are obtained
utilizing NASCET criteria, using the distal internal carotid
diameter as the denominator.
CONTRAST:  7 mL Gadavist

[Series 2: ax (id) · axial · 1.0mm · 0.43mm/px · z∈[-39,+47]mm · 16 of 183 slices shown]
[im 1/183]
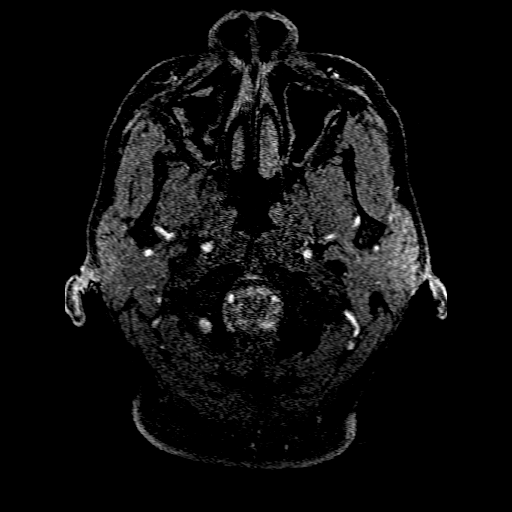
[im 4/183]
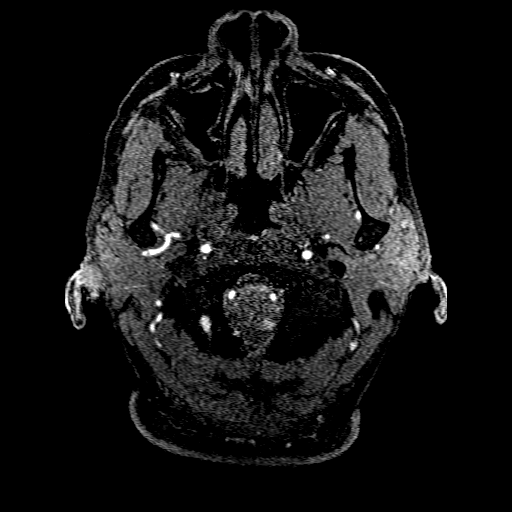
[im 8/183]
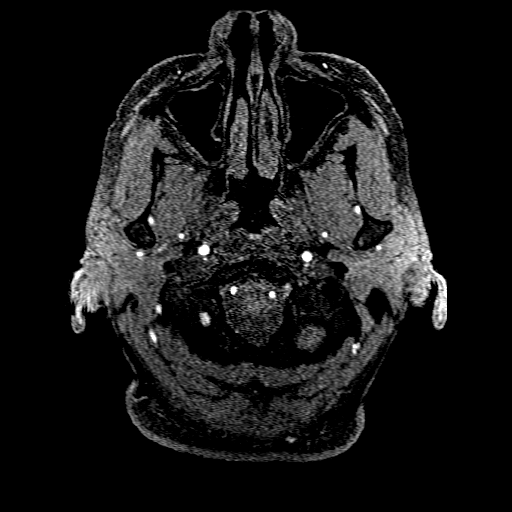
[im 12/183]
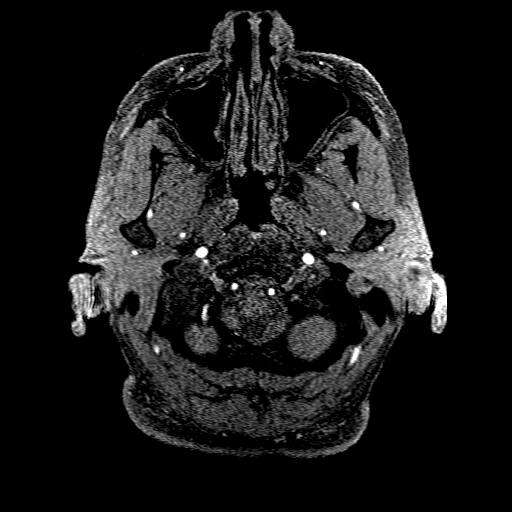
[im 16/183]
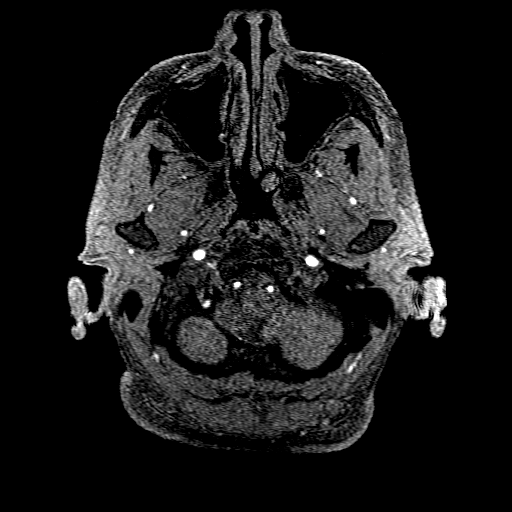
[im 20/183]
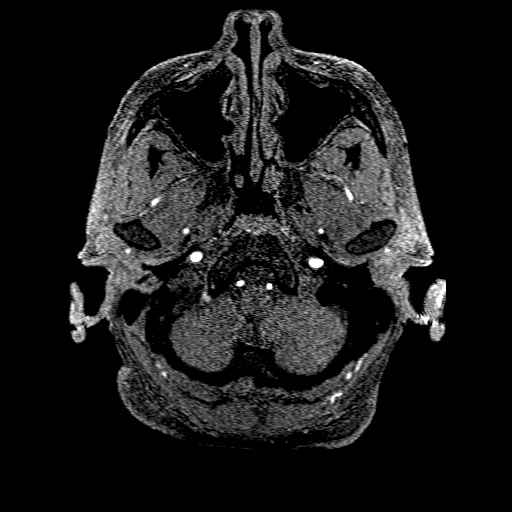
[im 28/183]
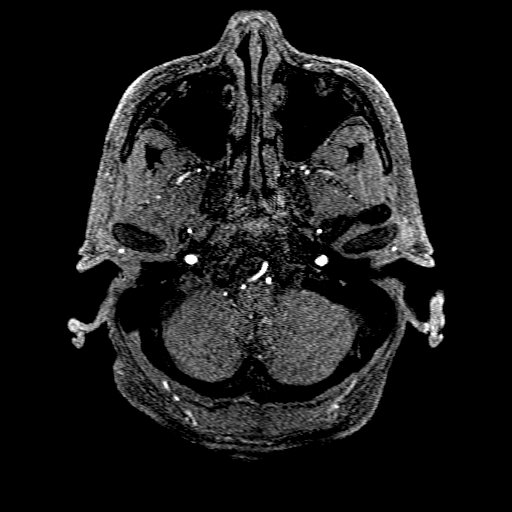
[im 32/183]
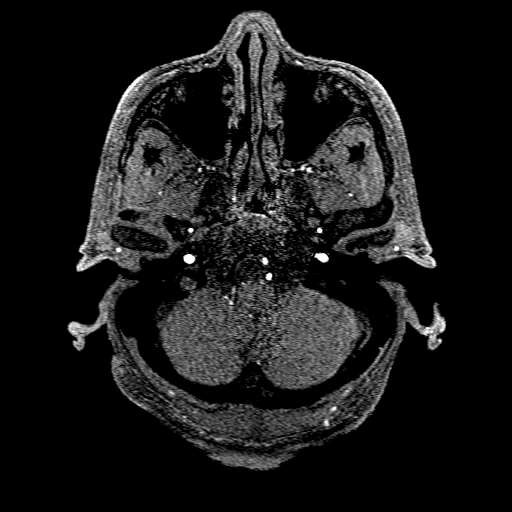
[im 56/183]
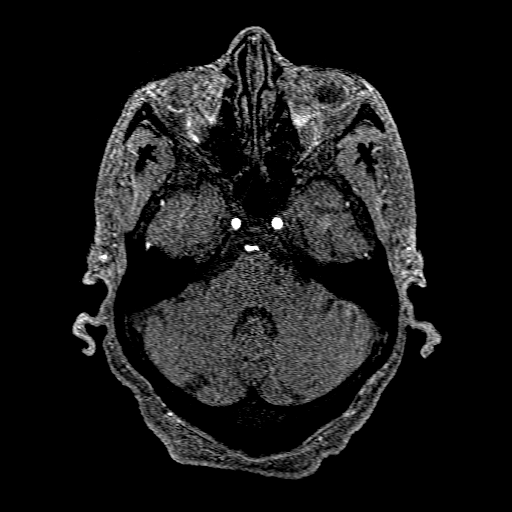
[im 80/183]
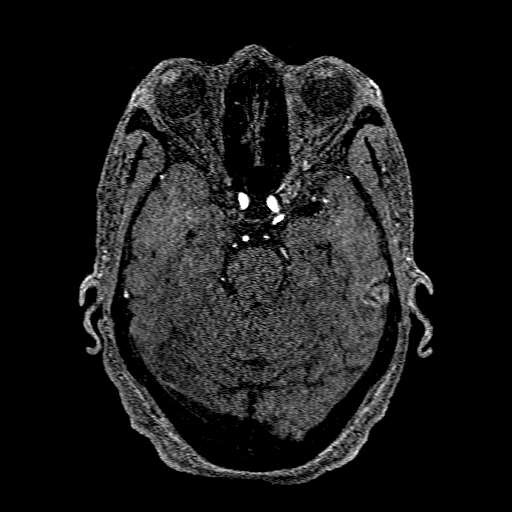
[im 92/183]
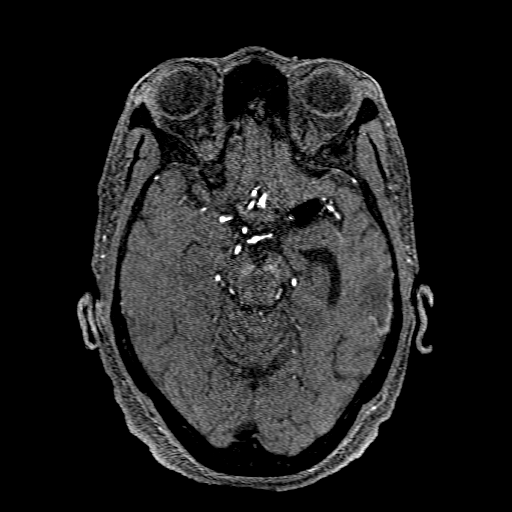
[im 103/183]
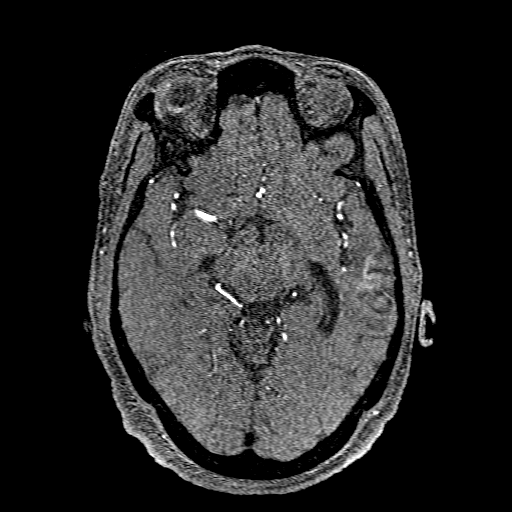
[im 127/183]
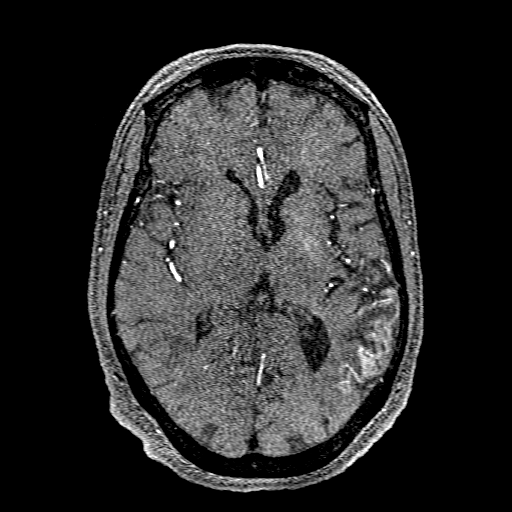
[im 151/183]
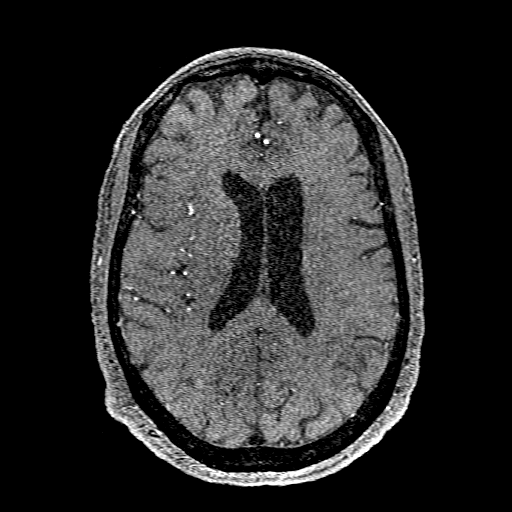
[im 155/183]
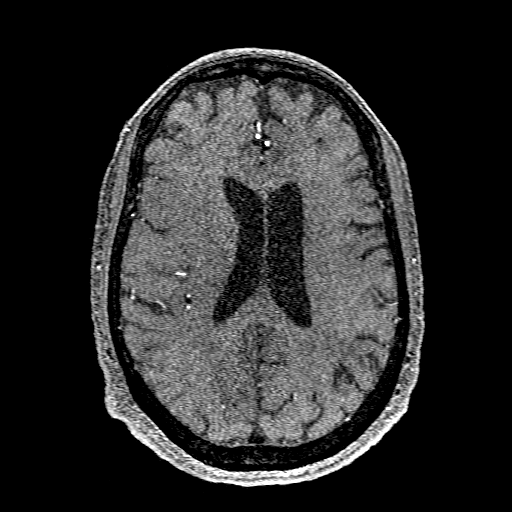
[im 175/183]
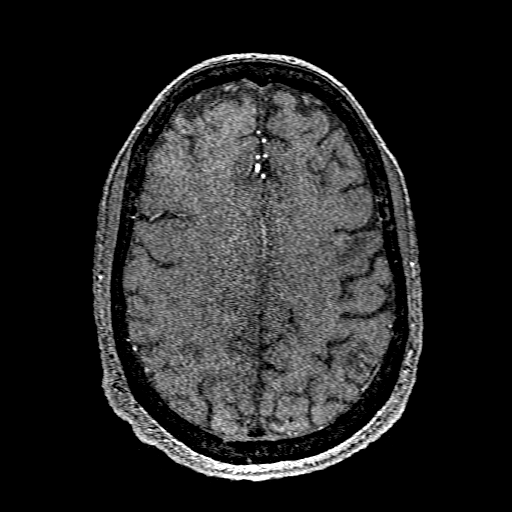

[Series 201: pjn:ax (id) · sagittal · 1.0mm · 0.43mm/px · 1 of 4 slices shown]
[im 1/4]
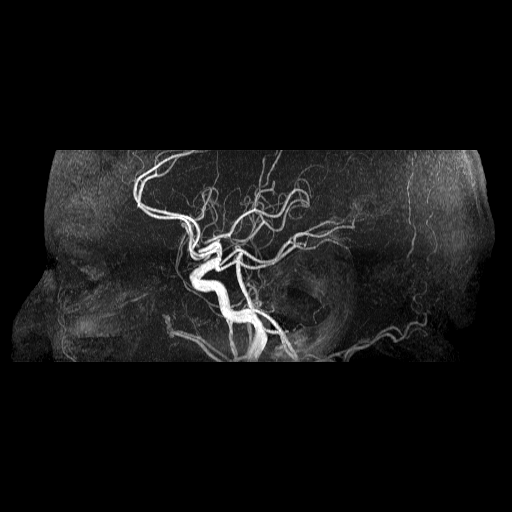

[17 of 48 positions shown; findings below may reference images not displayed]

FINDINGS: MR HEAD FINDINGS

Brain: There are subacute infarcts in the bilateral temporoparietal
regions and posterior left frontal cortex which are unchanged in
extent from the prior MRI and demonstrate persistent but overall
decreased diffusion weighted signal abnormality. New gyral
susceptibility artifact associated with the right temporoparietal
infarct corresponds to hyperdensity on CT and may reflect petechial
hemorrhage and/or mineralization. No definite new infarct is
identified. A moderate-sized chronic left MCA infarct is again noted
with associated chronic blood products. T2 hyperintensities
elsewhere in the cerebral white matter bilaterally are unchanged
from the prior MRI and are nonspecific but compatible with chronic
small vessel ischemia. A small chronic right cerebellar infarct is
again noted. There is mild ex vacuo dilatation of the left lateral
ventricle. No mass, midline shift, or extra-axial fluid collection
is evident.

Vascular: Major intracranial vascular flow voids are preserved.

Skull and upper cervical spine: Unremarkable bone marrow signal para

Sinuses/Orbits: Unremarkable orbits. Mild mucosal thickening in the
paranasal sinuses. Clear mastoid air cells.

Other: None.

MRA HEAD FINDINGS

Anterior circulation: The right intracranial internal carotid artery
is patent with an unchanged mild distal supraclinoid stenosis.
Artifact from a left M1 stent obscures the left ICA terminus with
the intracranial ICA being widely patent more proximally. Flow in
the left MCA stent cannot be directly evaluated on MRA, however flow
is present in left M2 and more distal branch vessels with a severe
distal left M2 stenosis again seen. The ACAs and right MCA are
patent with unchanged severe stenoses at the right MCA bifurcation
and in the proximal right A1 segment. The left A1 origin is obscured
by artifact from the MCA stent. No aneurysm is identified.

Posterior circulation: The intracranial vertebral arteries are
widely patent to the basilar. The right PICA and left AICA appear
dominant. Patent SCA is are seen bilaterally. The basilar artery is
widely patent. There are large posterior communicating arteries and
hypoplastic P1 segments bilaterally. Both PCAs are patent without
evidence of a significant proximal stenosis. No aneurysm is
identified.

MRA NECK FINDINGS

The patient became uncooperative, pulling at the IV, resulting in
suboptimal timing on the contrast-enhanced MRA. The study is still
diagnostic.

Aortic arch: Normal variant aortic arch branching pattern with
common origin of the brachiocephalic and left common carotid
arteries. Wide patency of the brachiocephalic and right subclavian
arteries. Suspected motion artifact through the left subclavian
origin.

Right carotid system: Patent without evidence of significant
stenosis or dissection.

Left carotid system: Patent without evidence of significant stenosis
or dissection.

Vertebral arteries: Patent and codominant with antegrade flow
bilaterally and no evidence of a significant stenosis or dissection.
IMPRESSION: 1. No acute infarct.
2. Evolving subacute bilateral temporoparietal and posterior left
frontal infarcts.
3. Chronic small vessel ischemia and old infarcts as above.
4. Unchanged intracranial atherosclerosis including severe stenoses
of the right MCA bifurcation, proximal right ACA, and distal left M2
segment.
5. Wide patency of the carotid arteries and vertebral arteries.

## 2021-06-01 IMAGING — MR MR MRA NECK WO/W CM
4 of 7 series · 17 of 48 positions shown · IV contrast (7 ML GAD)
Comparison: Head CT [DATE]. Head MRI [DATE]. Head and neck
MRA [DATE].

CLINICAL DATA: Stroke, follow-up.  Vertigo, central.

EXAM:
MRI HEAD WITHOUT CONTRAST
MRA HEAD WITHOUT CONTRAST
MRA OF THE NECK WITHOUT AND WITH CONTRAST
TECHNIQUE: Multiplanar, multi-echo pulse sequences of the brain and surrounding
structures were acquired without intravenous contrast. Angiographic
images of the Circle of Willis were acquired using MRA technique
without intravenous contrast. Angiographic images of the neck were
acquired using MRA technique without and with intravenous contrast.
Carotid stenosis measurements (when applicable) are obtained
utilizing NASCET criteria, using the distal internal carotid
diameter as the denominator.
CONTRAST:  7 mL Gadavist

[Series 400: cor cemra ft · coronal · 1.2mm · 0.59mm/px · 7 of 129 slices shown]
[im 1/129]
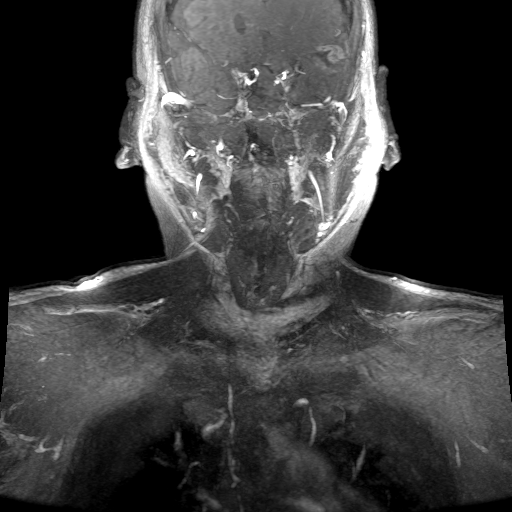
[im 22/129]
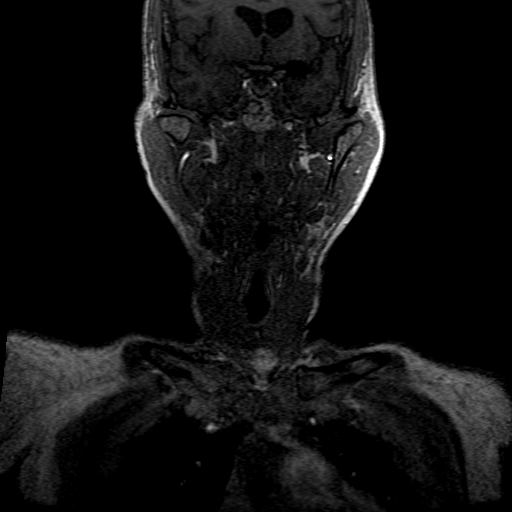
[im 43/129]
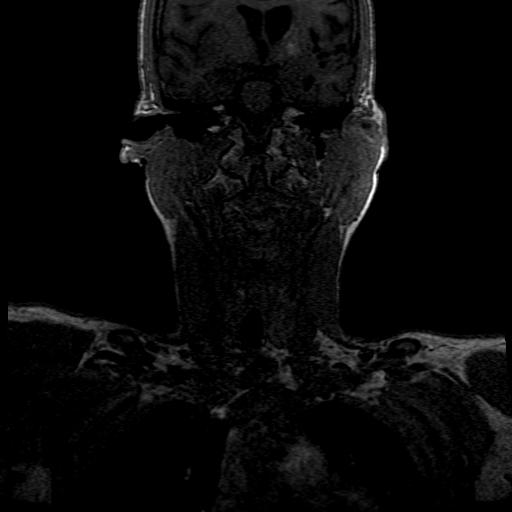
[im 65/129]
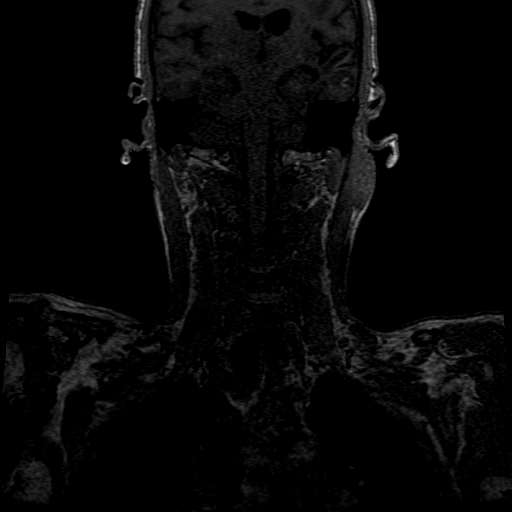
[im 86/129]
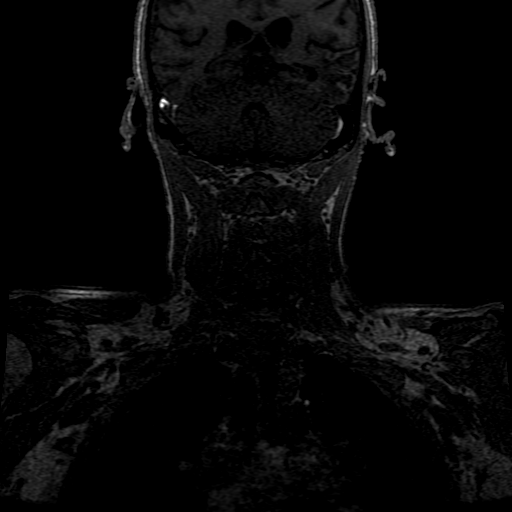
[im 107/129]
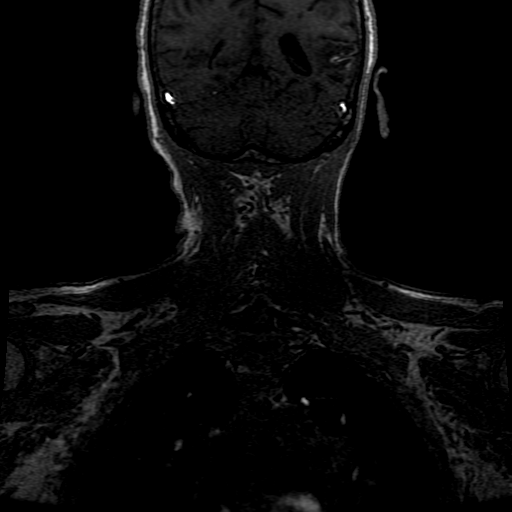
[im 129/129]
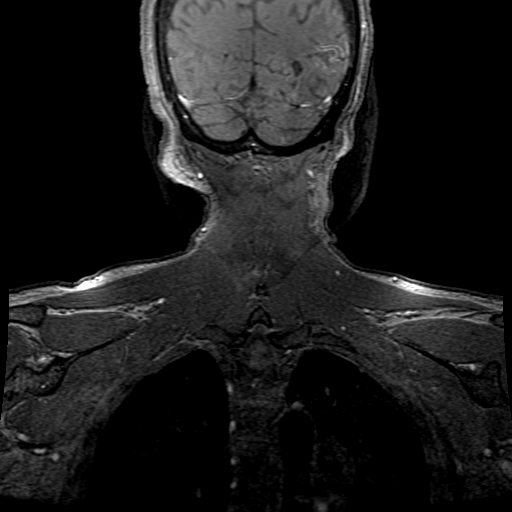

[Series 401: ph1/cor cemra ft · coronal · 1.2mm · 0.59mm/px · 4 of 128 slices shown]
[im 1/128]
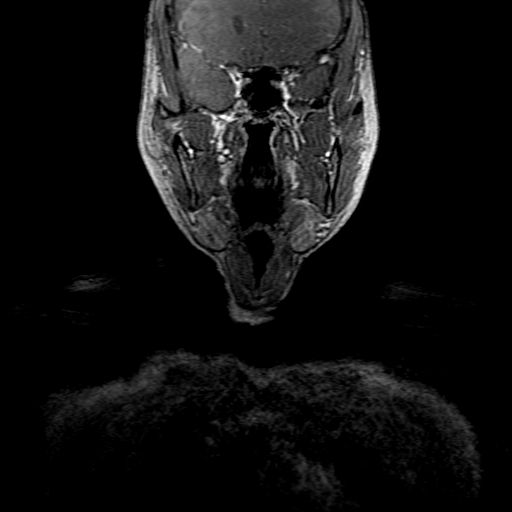
[im 19/128]
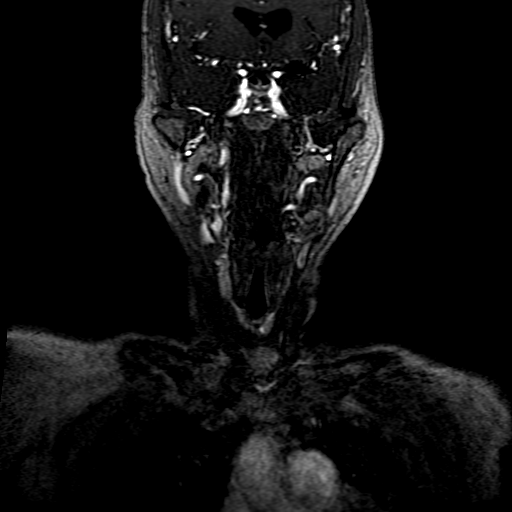
[im 73/128]
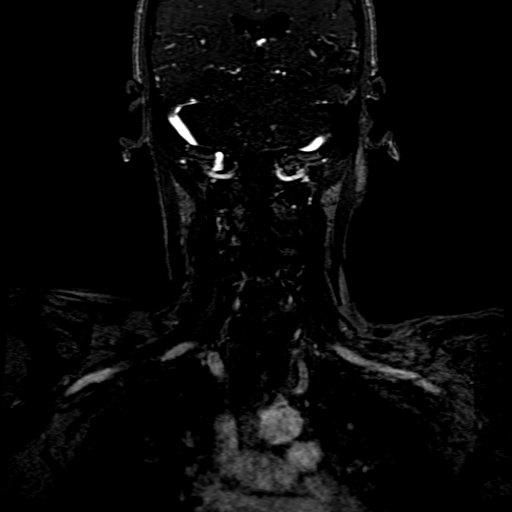
[im 109/128]
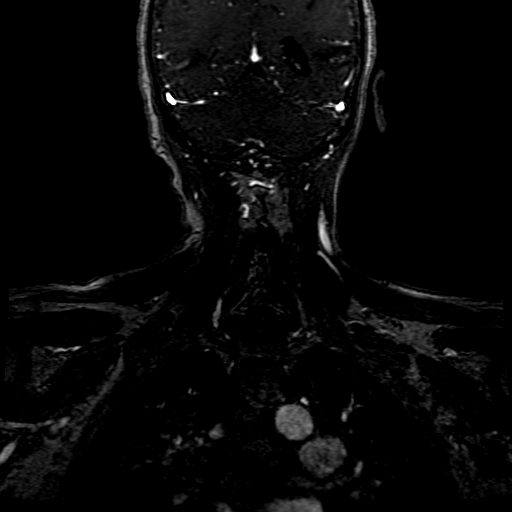

[Series 402: ph2/cor cemra ft · coronal · 1.2mm · 0.59mm/px · 3 of 128 slices shown]
[im 19/128]
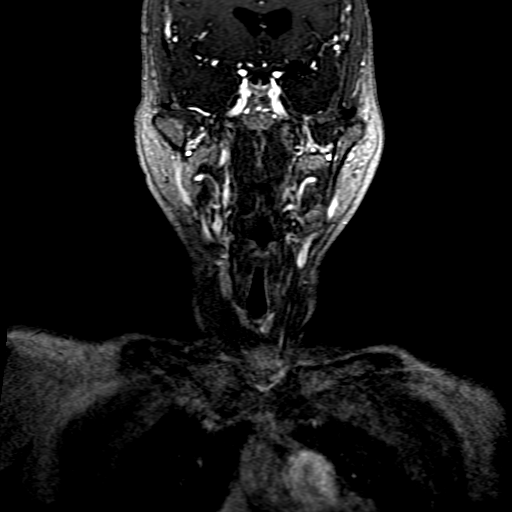
[im 73/128]
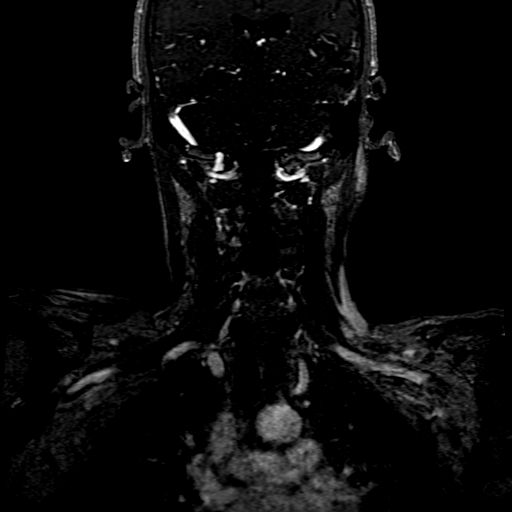
[im 109/128]
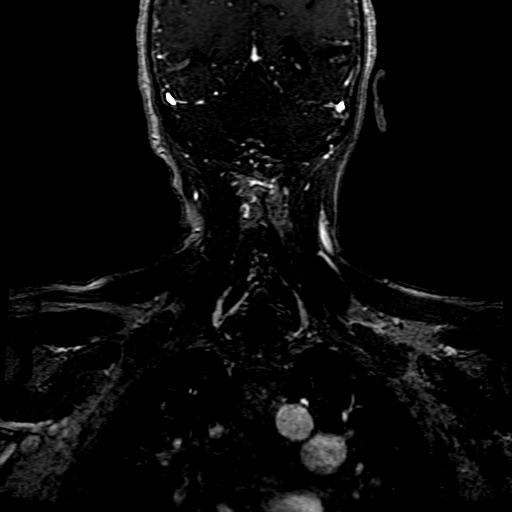

[((date))-((date)) · coronal · 1.2mm · 0.59mm/px · 3 of 129 slices shown]
[im 19/129]
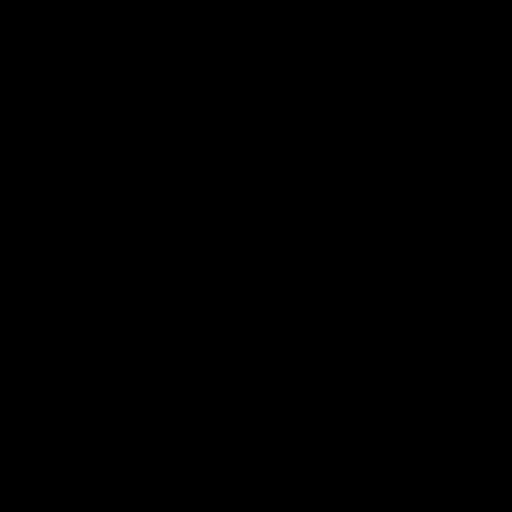
[im 74/129]
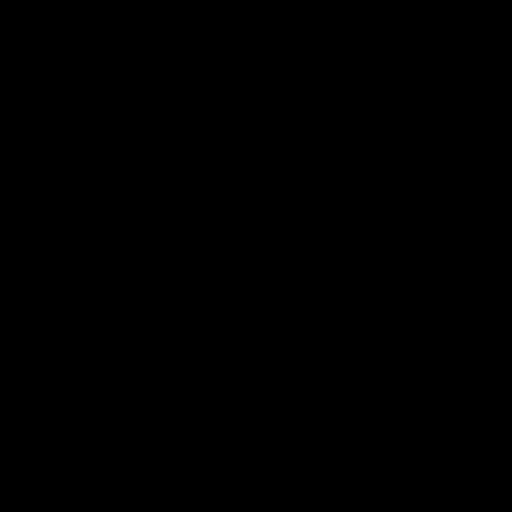
[im 110/129]
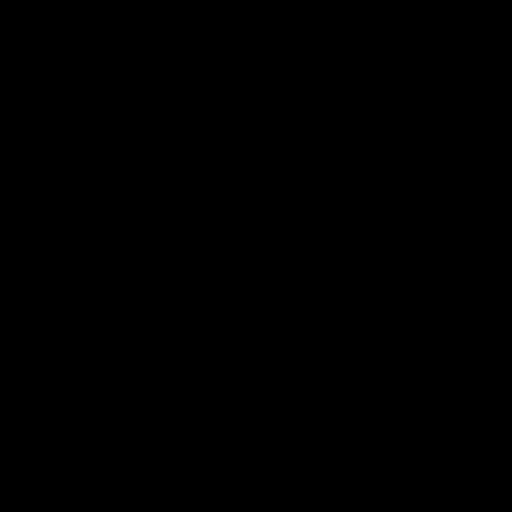

[17 of 48 positions shown; findings below may reference images not displayed]

FINDINGS: MR HEAD FINDINGS

Brain: There are subacute infarcts in the bilateral temporoparietal
regions and posterior left frontal cortex which are unchanged in
extent from the prior MRI and demonstrate persistent but overall
decreased diffusion weighted signal abnormality. New gyral
susceptibility artifact associated with the right temporoparietal
infarct corresponds to hyperdensity on CT and may reflect petechial
hemorrhage and/or mineralization. No definite new infarct is
identified. A moderate-sized chronic left MCA infarct is again noted
with associated chronic blood products. T2 hyperintensities
elsewhere in the cerebral white matter bilaterally are unchanged
from the prior MRI and are nonspecific but compatible with chronic
small vessel ischemia. A small chronic right cerebellar infarct is
again noted. There is mild ex vacuo dilatation of the left lateral
ventricle. No mass, midline shift, or extra-axial fluid collection
is evident.

Vascular: Major intracranial vascular flow voids are preserved.

Skull and upper cervical spine: Unremarkable bone marrow signal para

Sinuses/Orbits: Unremarkable orbits. Mild mucosal thickening in the
paranasal sinuses. Clear mastoid air cells.

Other: None.

MRA HEAD FINDINGS

Anterior circulation: The right intracranial internal carotid artery
is patent with an unchanged mild distal supraclinoid stenosis.
Artifact from a left M1 stent obscures the left ICA terminus with
the intracranial ICA being widely patent more proximally. Flow in
the left MCA stent cannot be directly evaluated on MRA, however flow
is present in left M2 and more distal branch vessels with a severe
distal left M2 stenosis again seen. The ACAs and right MCA are
patent with unchanged severe stenoses at the right MCA bifurcation
and in the proximal right A1 segment. The left A1 origin is obscured
by artifact from the MCA stent. No aneurysm is identified.

Posterior circulation: The intracranial vertebral arteries are
widely patent to the basilar. The right PICA and left AICA appear
dominant. Patent SCA is are seen bilaterally. The basilar artery is
widely patent. There are large posterior communicating arteries and
hypoplastic P1 segments bilaterally. Both PCAs are patent without
evidence of a significant proximal stenosis. No aneurysm is
identified.

MRA NECK FINDINGS

The patient became uncooperative, pulling at the IV, resulting in
suboptimal timing on the contrast-enhanced MRA. The study is still
diagnostic.

Aortic arch: Normal variant aortic arch branching pattern with
common origin of the brachiocephalic and left common carotid
arteries. Wide patency of the brachiocephalic and right subclavian
arteries. Suspected motion artifact through the left subclavian
origin.

Right carotid system: Patent without evidence of significant
stenosis or dissection.

Left carotid system: Patent without evidence of significant stenosis
or dissection.

Vertebral arteries: Patent and codominant with antegrade flow
bilaterally and no evidence of a significant stenosis or dissection.
IMPRESSION: 1. No acute infarct.
2. Evolving subacute bilateral temporoparietal and posterior left
frontal infarcts.
3. Chronic small vessel ischemia and old infarcts as above.
4. Unchanged intracranial atherosclerosis including severe stenoses
of the right MCA bifurcation, proximal right ACA, and distal left M2
segment.
5. Wide patency of the carotid arteries and vertebral arteries.

## 2021-06-01 IMAGING — MR MR HEAD W/O CM
6 of 11 series · 24 of 48 positions shown · IV contrast (gadavist)
Comparison: Head CT [DATE]. Head MRI [DATE]. Head and neck
MRA [DATE].

CLINICAL DATA: Stroke, follow-up.  Vertigo, central.

EXAM:
MRI HEAD WITHOUT CONTRAST
MRA HEAD WITHOUT CONTRAST
MRA OF THE NECK WITHOUT AND WITH CONTRAST
TECHNIQUE: Multiplanar, multi-echo pulse sequences of the brain and surrounding
structures were acquired without intravenous contrast. Angiographic
images of the Circle of Willis were acquired using MRA technique
without intravenous contrast. Angiographic images of the neck were
acquired using MRA technique without and with intravenous contrast.
Carotid stenosis measurements (when applicable) are obtained
utilizing NASCET criteria, using the distal internal carotid
diameter as the denominator.
CONTRAST:  7 mL Gadavist

[Series 3: DWI · axial · 3.0mm · 0.94mm/px · z∈[-36,+110]mm · 7 of 99 slices shown (1 of 2)]
[im 1/99]
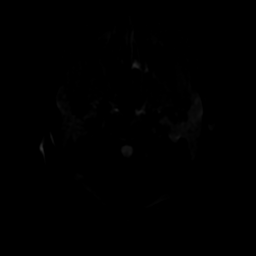
[im 17/99]
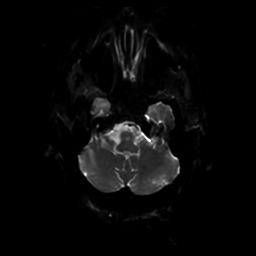
[im 33/99]
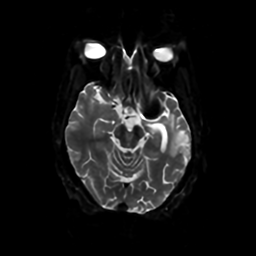
[im 50/99]
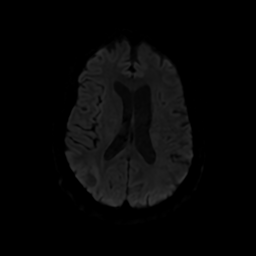
[im 66/99]
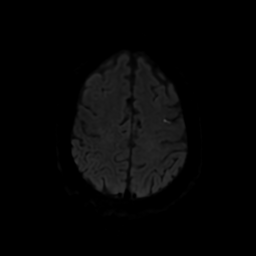
[im 82/99]
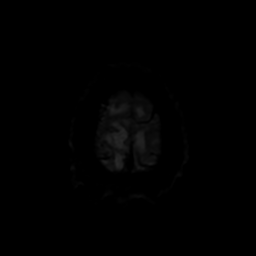
[im 99/99]
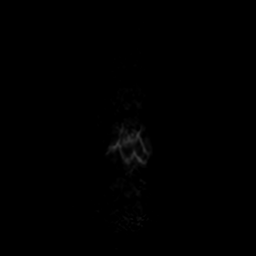

[Series 4: DWI · coronal · 4.0mm · 0.94mm/px · 5 of 74 slices shown (2 of 2)]
[im 1/74]
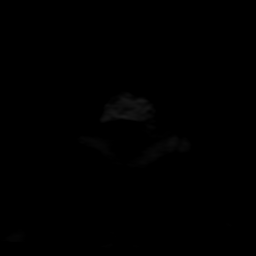
[im 19/74]
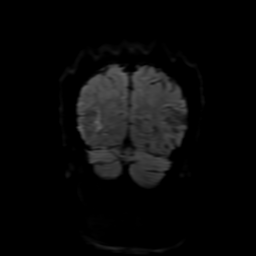
[im 37/74]
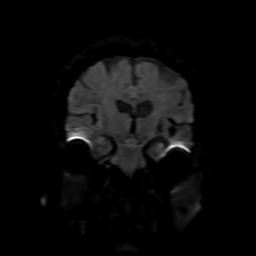
[im 55/74]
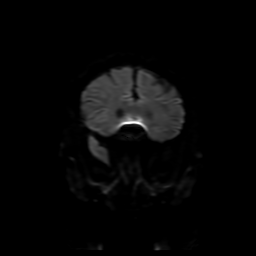
[im 74/74]
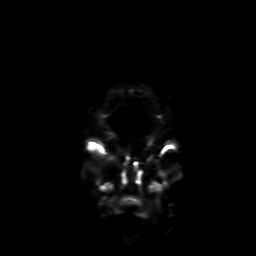

[Series 5: FLAIR · sagittal · 5.0mm · 0.23mm/px · 2 of 24 slices shown (1 of 2)]
[im 1/24]
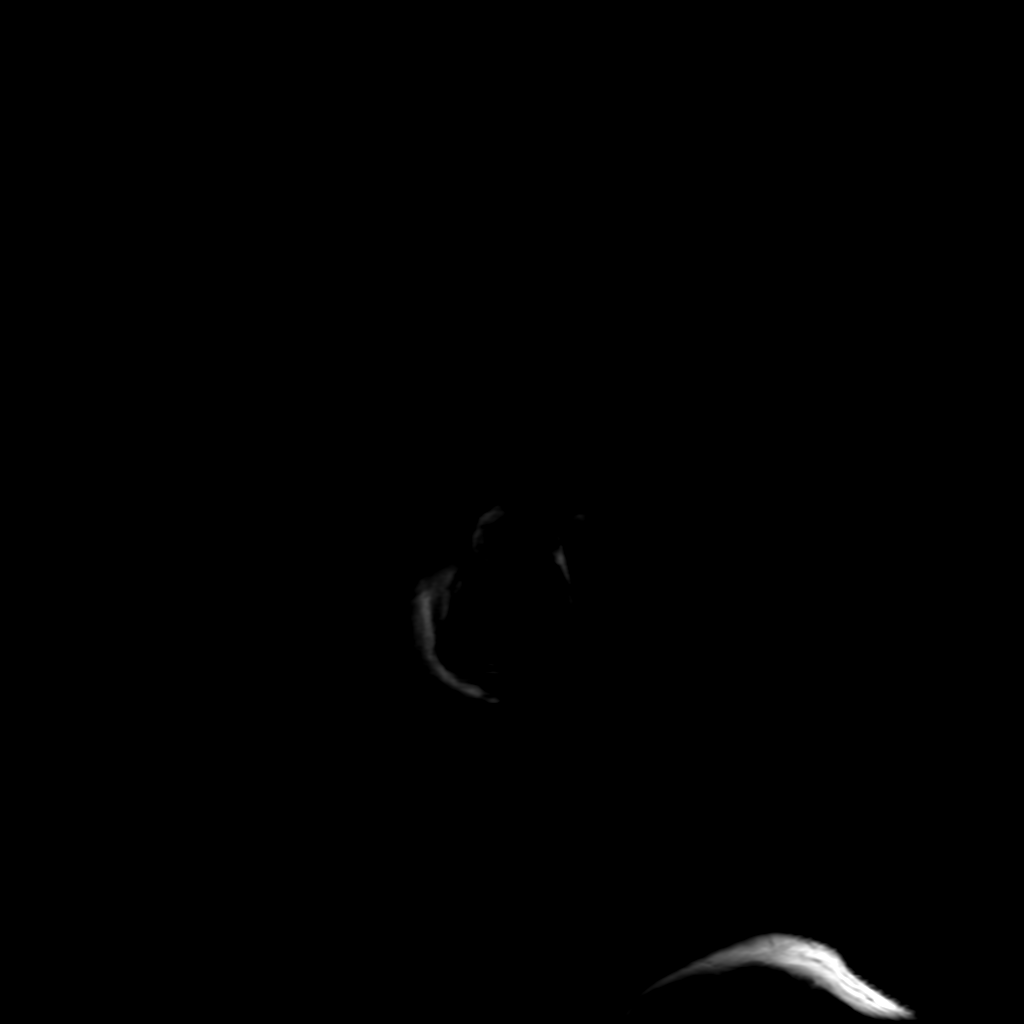
[im 24/24]
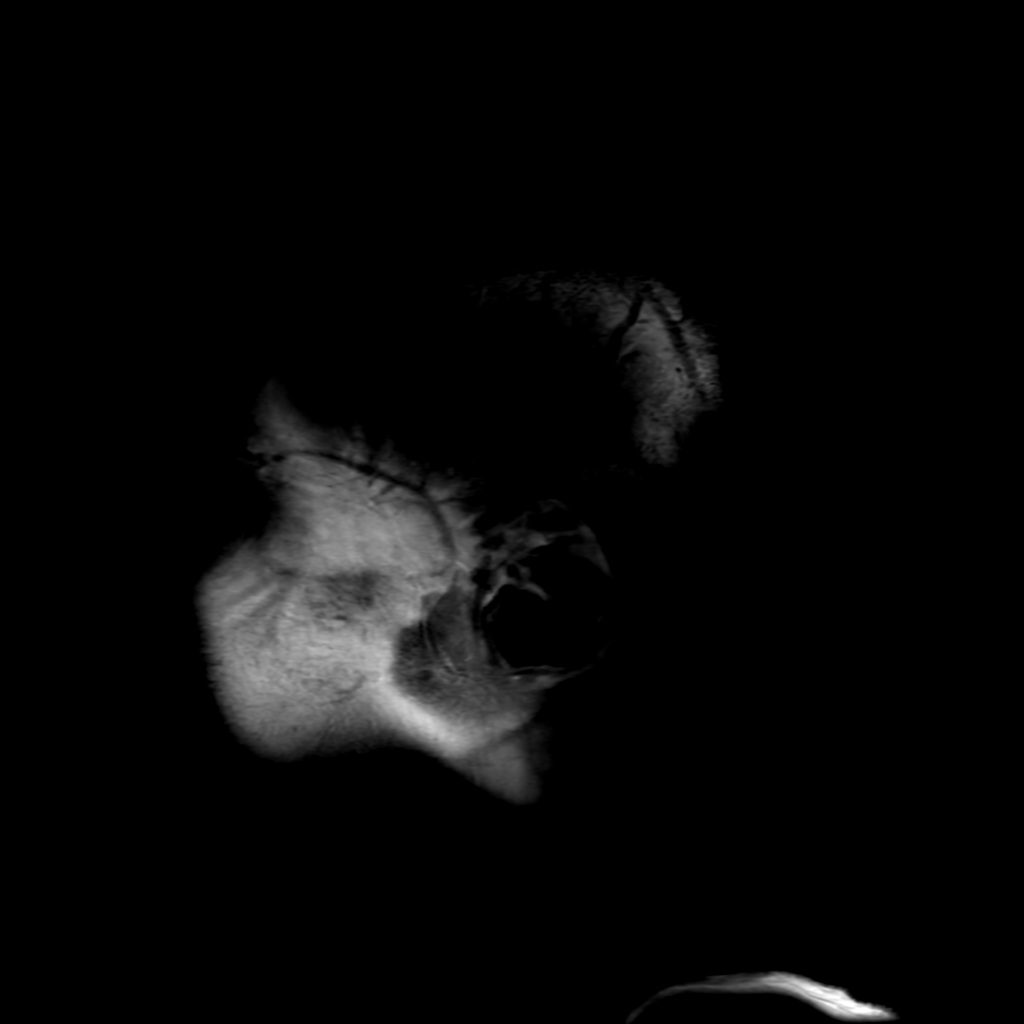

[Series 7: FLAIR · axial · 4.0mm · 0.45mm/px · z∈[-49,+109]mm · 3 of 37 slices shown (2 of 2)]
[im 1/37]
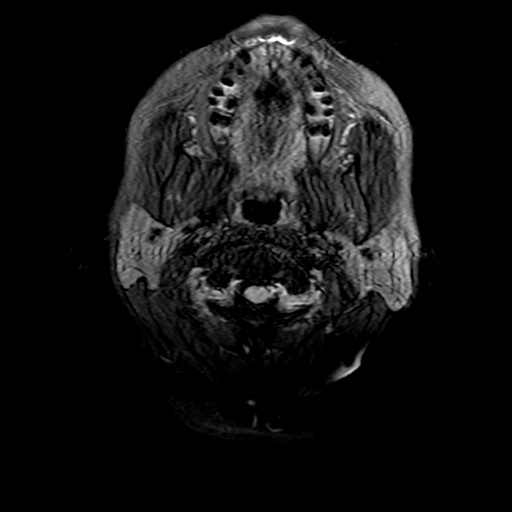
[im 19/37]
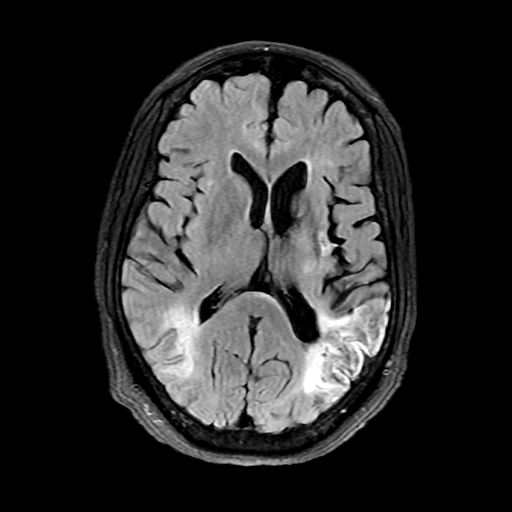
[im 37/37]
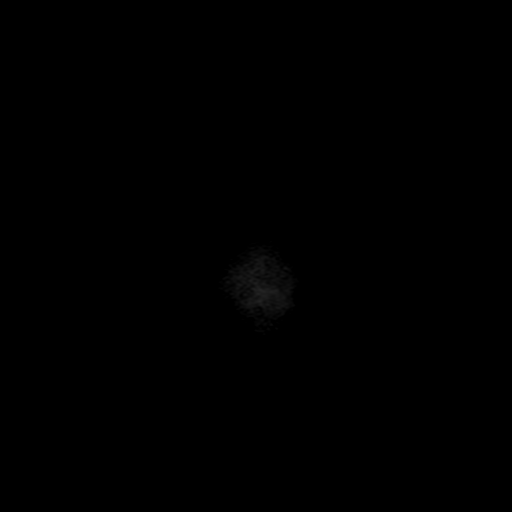

[Series 350: ADC · axial · 3.0mm · 0.94mm/px · z∈[-36,+110]mm · 4 of 50 slices shown (1 of 2)]
[im 1/50]
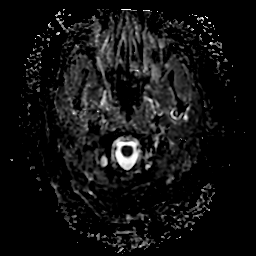
[im 17/50]
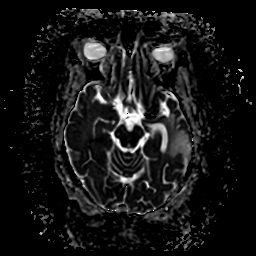
[im 33/50]
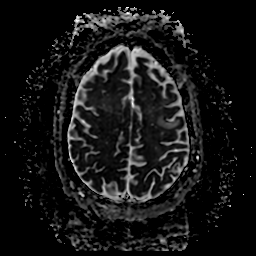
[im 50/50]
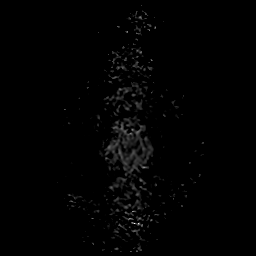

[Series 450: ADC · coronal · 4.0mm · 0.94mm/px · 3 of 36 slices shown (2 of 2)]
[im 1/36]
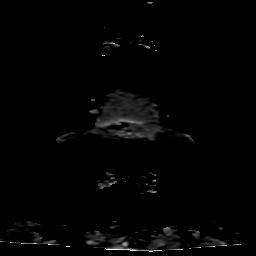
[im 18/36]
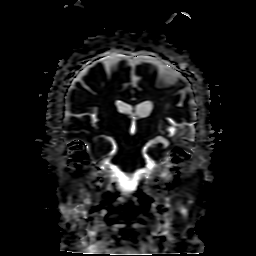
[im 36/36]
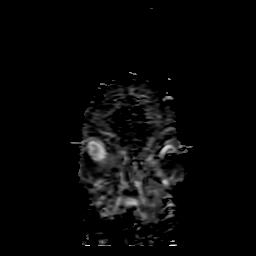

[24 of 48 positions shown; findings below may reference images not displayed]

FINDINGS: MR HEAD FINDINGS

Brain: There are subacute infarcts in the bilateral temporoparietal
regions and posterior left frontal cortex which are unchanged in
extent from the prior MRI and demonstrate persistent but overall
decreased diffusion weighted signal abnormality. New gyral
susceptibility artifact associated with the right temporoparietal
infarct corresponds to hyperdensity on CT and may reflect petechial
hemorrhage and/or mineralization. No definite new infarct is
identified. A moderate-sized chronic left MCA infarct is again noted
with associated chronic blood products. T2 hyperintensities
elsewhere in the cerebral white matter bilaterally are unchanged
from the prior MRI and are nonspecific but compatible with chronic
small vessel ischemia. A small chronic right cerebellar infarct is
again noted. There is mild ex vacuo dilatation of the left lateral
ventricle. No mass, midline shift, or extra-axial fluid collection
is evident.

Vascular: Major intracranial vascular flow voids are preserved.

Skull and upper cervical spine: Unremarkable bone marrow signal para

Sinuses/Orbits: Unremarkable orbits. Mild mucosal thickening in the
paranasal sinuses. Clear mastoid air cells.

Other: None.

MRA HEAD FINDINGS

Anterior circulation: The right intracranial internal carotid artery
is patent with an unchanged mild distal supraclinoid stenosis.
Artifact from a left M1 stent obscures the left ICA terminus with
the intracranial ICA being widely patent more proximally. Flow in
the left MCA stent cannot be directly evaluated on MRA, however flow
is present in left M2 and more distal branch vessels with a severe
distal left M2 stenosis again seen. The ACAs and right MCA are
patent with unchanged severe stenoses at the right MCA bifurcation
and in the proximal right A1 segment. The left A1 origin is obscured
by artifact from the MCA stent. No aneurysm is identified.

Posterior circulation: The intracranial vertebral arteries are
widely patent to the basilar. The right PICA and left AICA appear
dominant. Patent SCA is are seen bilaterally. The basilar artery is
widely patent. There are large posterior communicating arteries and
hypoplastic P1 segments bilaterally. Both PCAs are patent without
evidence of a significant proximal stenosis. No aneurysm is
identified.

MRA NECK FINDINGS

The patient became uncooperative, pulling at the IV, resulting in
suboptimal timing on the contrast-enhanced MRA. The study is still
diagnostic.

Aortic arch: Normal variant aortic arch branching pattern with
common origin of the brachiocephalic and left common carotid
arteries. Wide patency of the brachiocephalic and right subclavian
arteries. Suspected motion artifact through the left subclavian
origin.

Right carotid system: Patent without evidence of significant
stenosis or dissection.

Left carotid system: Patent without evidence of significant stenosis
or dissection.

Vertebral arteries: Patent and codominant with antegrade flow
bilaterally and no evidence of a significant stenosis or dissection.
IMPRESSION: 1. No acute infarct.
2. Evolving subacute bilateral temporoparietal and posterior left
frontal infarcts.
3. Chronic small vessel ischemia and old infarcts as above.
4. Unchanged intracranial atherosclerosis including severe stenoses
of the right MCA bifurcation, proximal right ACA, and distal left M2
segment.
5. Wide patency of the carotid arteries and vertebral arteries.

## 2021-06-01 IMAGING — CT CT HEAD W/O CM
4 series · 16 of 47 positions shown, 18 images · non-contrast
Comparison: Head CT [DATE] and MRI [DATE]

CLINICAL DATA: Stroke follow-up. Vertigo, vomiting, altered mental
status, and slurred speech.

EXAM:
CT HEAD WITHOUT CONTRAST
TECHNIQUE: Contiguous axial images were obtained from the base of the skull
through the vertex without intravenous contrast.

[Series 3: head bone · axial · 0.42mm/px · z∈[-145,-113]mm · 3 of 79 slices shown]
[im 8/79  bone]
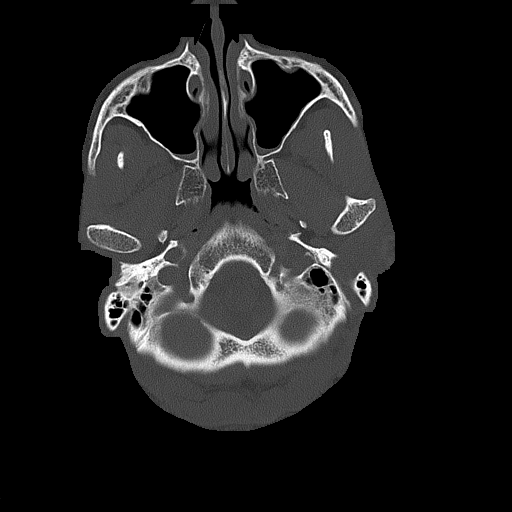
[im 16/79  bone]
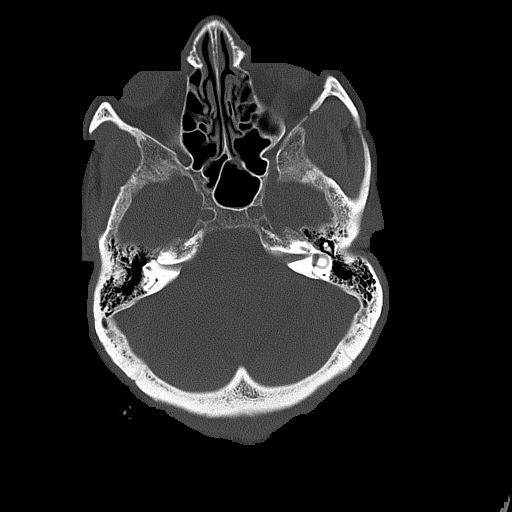
[im 24/79  bone]
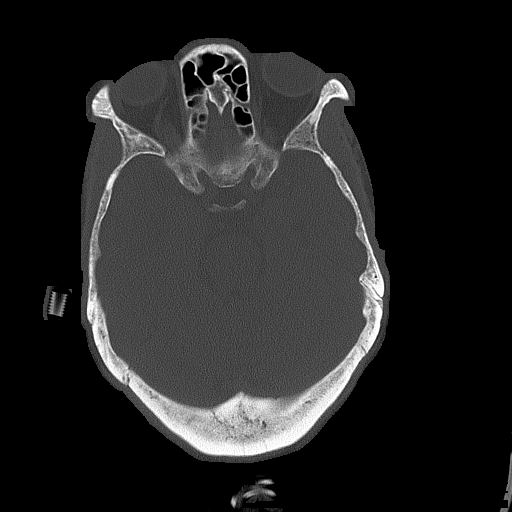

[Series 4: head without · axial · non-contrast · 0.42mm/px · z∈[-144,-24]mm · 7 of 32 slices shown, 9 images]
[im 4/32  brain]
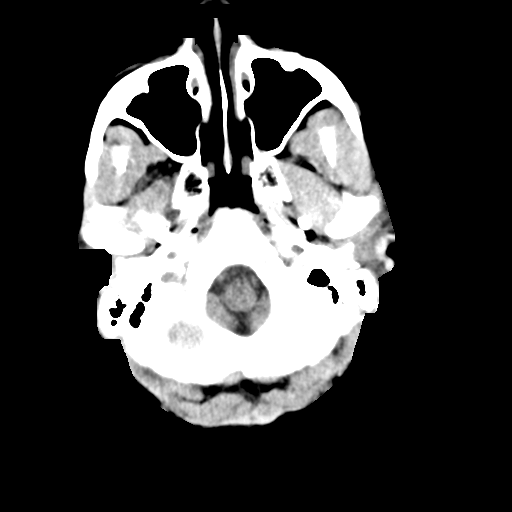
[im 4/32  bone]
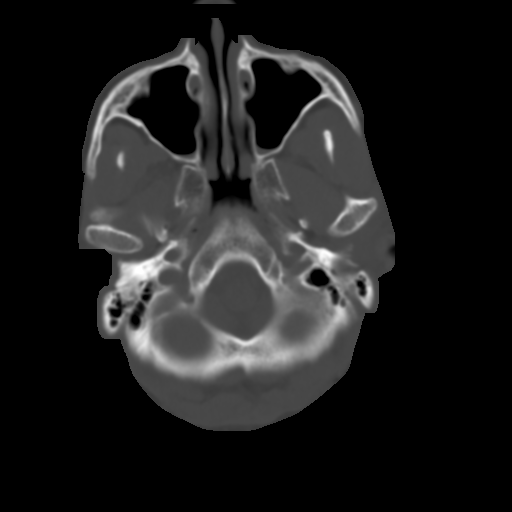
[im 8/32  brain]
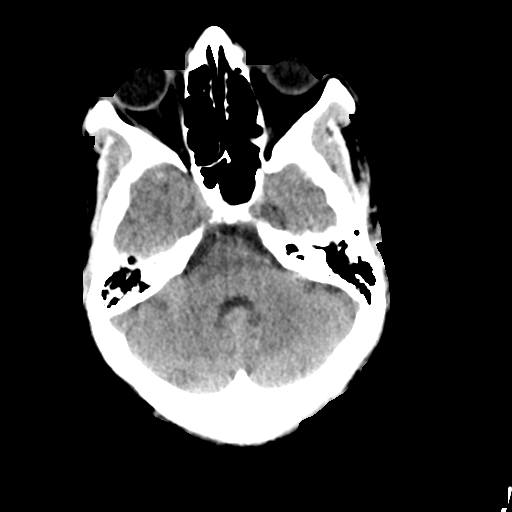
[im 12/32  brain]
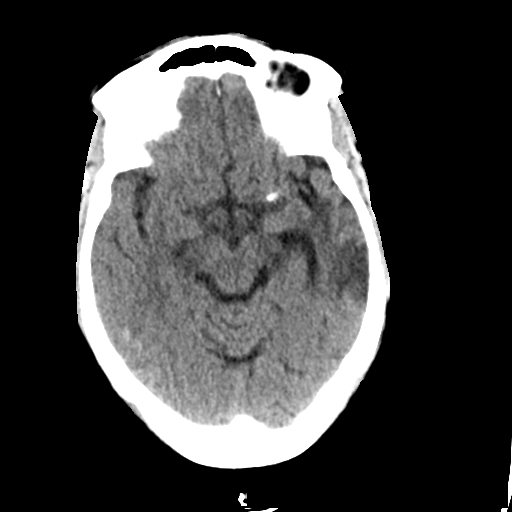
[im 16/32  brain]
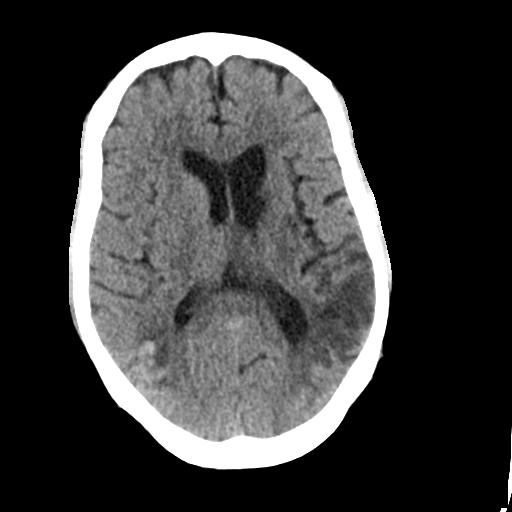
[im 20/32  brain]
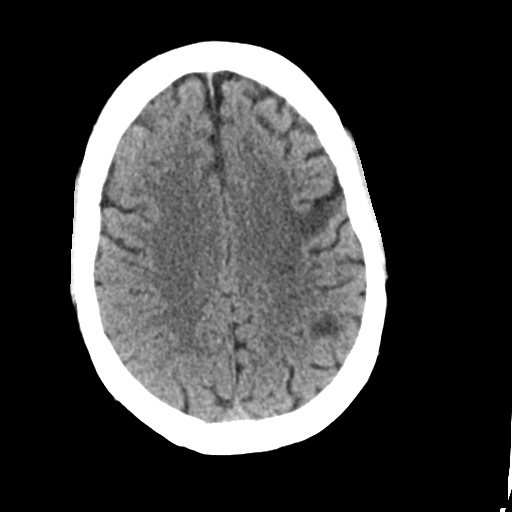
[im 20/32  bone]
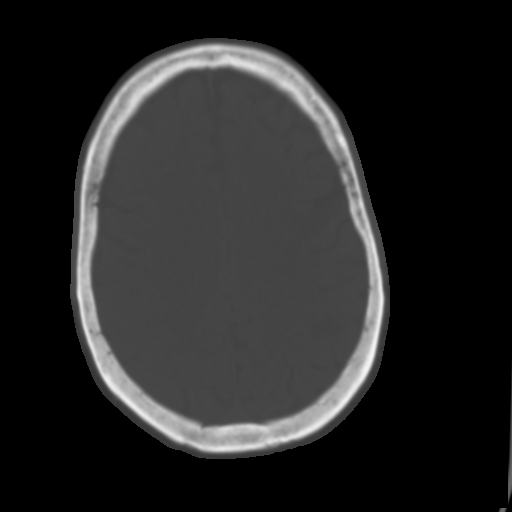
[im 24/32  brain]
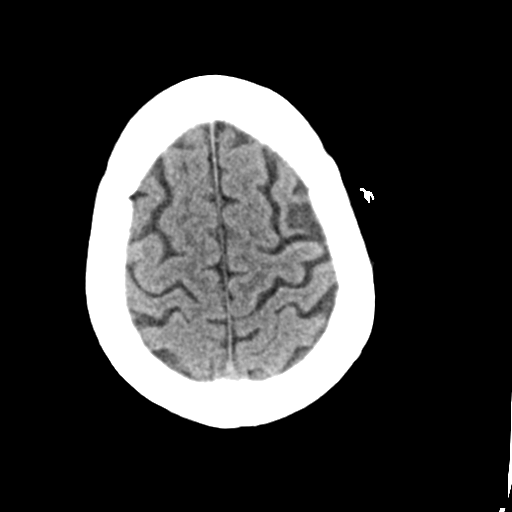
[im 28/32  brain]
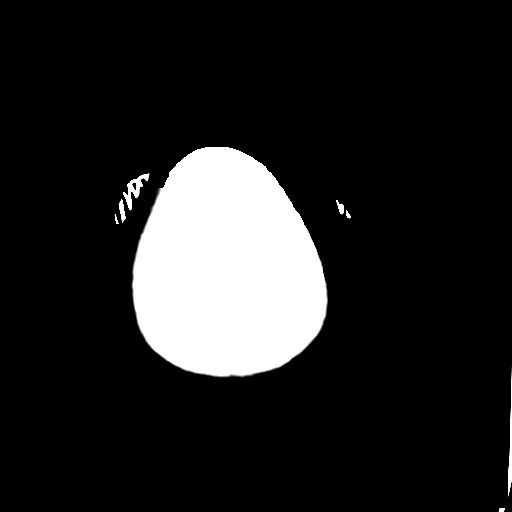

[Series 5: head without cor · coronal · non-contrast · 0.33mm/px · 3 of 66 slices shown]
[im 22/66  brain]
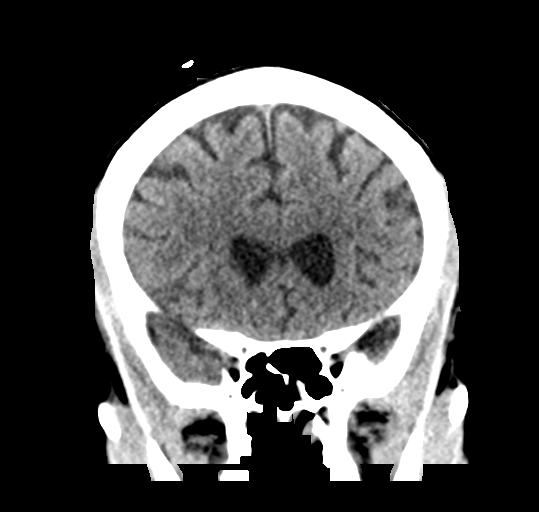
[im 29/66  brain]
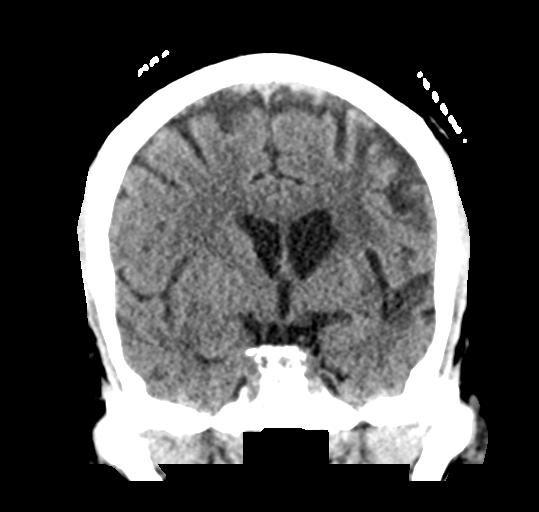
[im 37/66  brain]
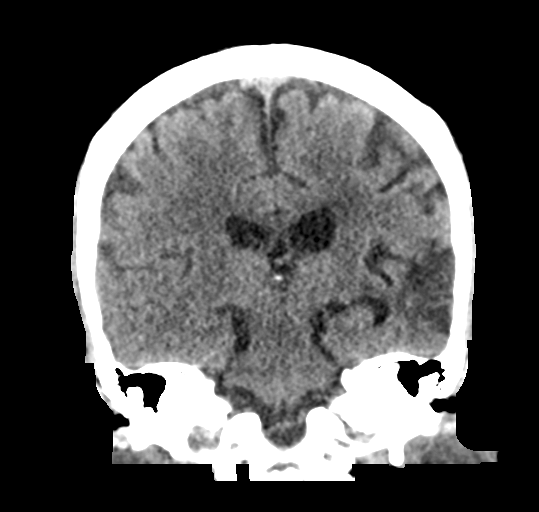

[Series 6: head without sag · sagittal · non-contrast · 0.32mm/px · 3 of 51 slices shown]
[im 17/51  brain]
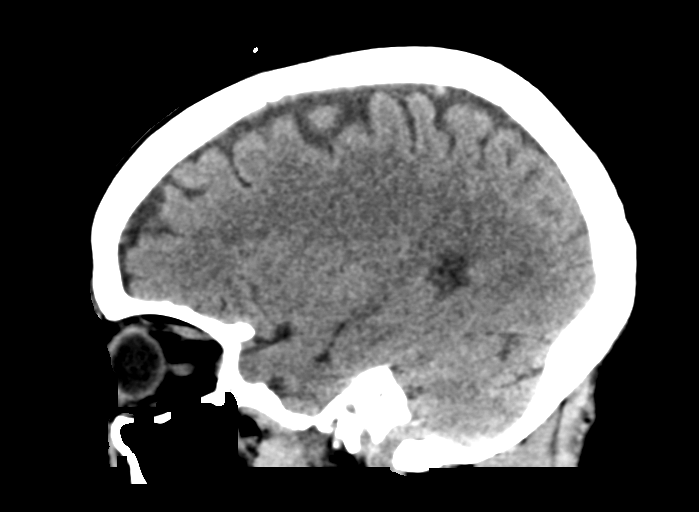
[im 26/51  brain]
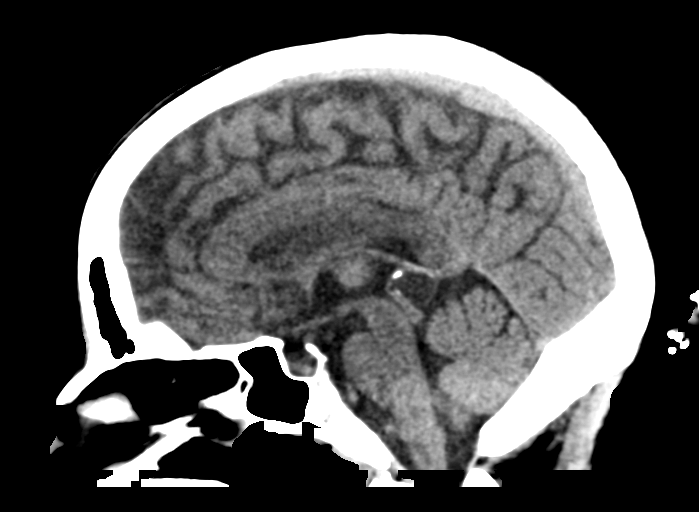
[im 34/51  brain]
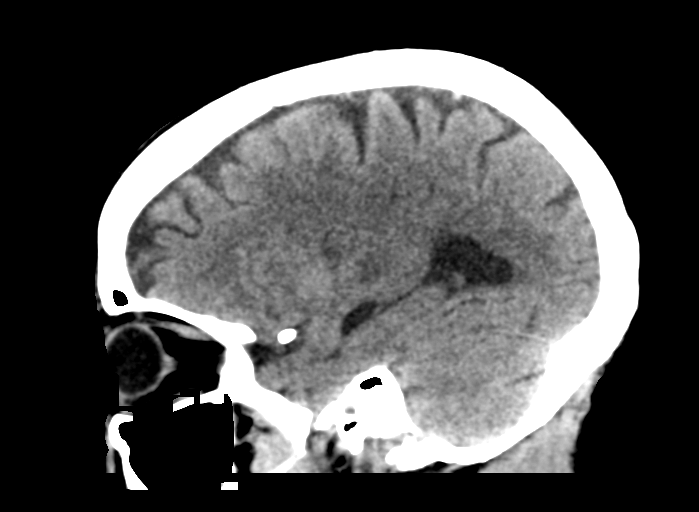

[16 of 47 positions shown; findings below may reference images not displayed]

FINDINGS: Brain: A subacute infarct in the right temporoparietal junction
region is similar in size to the prior studies but demonstrates new
gyral hyperdensity which may reflect petechial hemorrhage and/or
mineralization. Additional punctate bilateral cerebral infarcts on
the prior MRI are not well seen by CT. A moderate-sized chronic left
MCA infarct and small chronic right cerebellar infarct are again
noted. There is mild ex vacuo dilatation of the left lateral
ventricle. No definite new infarct, mass, midline shift, or
extra-axial fluid collection is identified. Background mild chronic
small vessel ischemia is noted in the cerebral white matter.

Vascular: Calcified atherosclerosis at the skull base. Left MCA
stent.

Skull: No fracture or suspicious osseous lesion.

Sinuses/Orbits: Mild mucosal thickening in the maxillary sinuses.
Clear mastoid air cells. Unremarkable orbits.

Other: None.
IMPRESSION: 1. Evolving subacute right temporoparietal infarct with new
petechial hemorrhage and/or mineralization.
2. Chronic left MCA and right cerebellar infarcts.

## 2021-06-01 MED ORDER — ONDANSETRON 4 MG PO TBDP: 4.0000 mg | ORAL_TABLET | Freq: Three times a day (TID) | ORAL | 0 refills | Status: DC | PRN

## 2021-06-01 MED ORDER — GADOBUTROL 1 MMOL/ML IV SOLN
7.0000 mL | Freq: Once | INTRAVENOUS | Status: AC | PRN
  Administered 2021-06-01: 16:00:00 7 mL via INTRAVENOUS

## 2021-06-01 NOTE — ED Triage Notes (Signed)
Pt was at speech therapy that she has been going to regularly sine she has had 4 strokes.  During therapy  today she started slurring her speech and had a syncope episode. Immediately afterwards it's reported and confirmed that she is back to baseline

## 2021-06-01 NOTE — ED Provider Notes (Signed)
[facility] EMERGENCY DEPARTMENT Provider Note   CSN: [id number] Arrival date & time: 06/01/21  1113     History Chief Complaint  Patient presents with   Altered Mental Status    Courtney Burke is a 65 y.o. 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  This episode occurred around 10:20 AM according to the patient's daughter, who is at bedside.  The patient lives with her daughter at home.  Patient reports that he is she was at therapy today and began to feel very lightheaded and nauseous, vomited a few times.  There was report of possible loss of consciousness or near syncope in the office.  She has now back to her baseline mental status according to her daughter.  At baseline, since her stroke, she does have some expressive aphasia and word confusion.  She intermittently has difficulty following commands.  She is currently taking Eliquis twice a day, did take her morning dose.  This is for paroxysmal A. fib.  The patient denies any ongoing symptoms aside from a mild headache and some nausea and dizziness.  She has a difficult time articulating any other symptoms to me.  Her daughter feels she is at her baseline.  The patient reports that she had URI type symptoms earlier this week with cough and congestion  HPI     Past Medical History:  Diagnosis Date   Acute ischemic left MCA stroke ([place]) 06/30/2020   Acute ischemic stroke (HCC)    Acute stroke due to occlusion of left middle cerebral artery ([place]) 06/30/2020   Age-related nuclear cataract of both eyes 09/28/2015   Arthralgia of multiple joints 09/28/2015   Asthma    Borderline diabetes    Cerebrovascular accident (CVA) ([place])    Chronic bilateral thoracic back pain 06/05/2018   Chronic renal  insufficiency, stage 1 08/11/2014   Diabetes mellitus type 2 in obese ([place])    Dyslipidemia    Dysphagia, post-stroke    Erosive gastritis 07/20/2019   Formatting of this note might be different from the original. On EGD 07/2019   Esophagitis 07/20/2019   Formatting of this note might be different from the original. Added automatically from request for surgery [id number]  Formatting of this note might be different from the original. On EGD 07/2019   Essential hypertension 09/28/2015   Essential thrombocytosis ([place]) 09/28/2015   Gallstones 08/18/2019   Formatting of this note might be different from the original. 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/28/2015    Patient Active Problem List   Diagnosis Date Noted   Syncope 05/16/2021   Breast lump 05/16/2021   Seizure ([place]) 05/16/2021   Acute ischemic right MCA stroke ([place]) 05/11/2021   CVA (cerebral vascular accident) ([place]) 05/11/2021   Acute cerebral infarction ([place]) 05/10/2021   Dyslipidemia    Stage 3b chronic kidney disease (HCC)    Dysphagia, post-stroke    Diabetes mellitus  type 2 in obese ([place])    Gallstones 08/18/2019   Esophagitis 07/20/2019   Erosive gastritis 07/20/2019   Chronic bilateral thoracic back pain 06/05/2018   Age-related nuclear cataract of both eyes 09/28/2015   Asthma 09/28/2015   Essential hypertension 09/28/2015   Essential thrombocytosis ([place]) 09/28/2015   Gastroesophageal reflux disease without esophagitis 09/28/2015   Keratoconjunctivitis sicca of both eyes not specified as Sjogren's  09/28/2015   Status post gastric bypass for obesity 09/28/2015   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   Morbid obesity ([place]) 05/21/2013   Hyperlipidemia, unspecified 03/16/2013    Past Surgical History:  Procedure Laterality Date   ANKLE SURGERY     CARPAL TUNNEL RELEASE     carpel tunnel     IR ANGIO INTRA EXTRACRAN SEL COM CAROTID INNOMINATE BILAT MOD SED  01/17/2021   IR ANGIO VERTEBRAL SEL SUBCLAVIAN INNOMINATE UNI R MOD SED  01/17/2021   IR CT HEAD LTD  06/30/2020   IR INTRA CRAN STENT  06/30/2020   IR PERCUTANEOUS ART THROMBECTOMY/INFUSION INTRACRANIAL INC DIAG ANGIO  06/30/2020   IR RADIOLOGIST EVAL & MGMT  08/26/2020   IR US GUIDE VASC ACCESS RIGHT  01/17/2021   RADIOLOGY WITH ANESTHESIA N/A 06/30/2020   Procedure: RADIOLOGY WITH ANESTHESIA;  Surgeon: Radiologist, Medication, MD;  Location: [facility];  Service: Radiology;  Laterality: N/A;     OB History   No obstetric history on file.     Family History  Problem Relation Age of Onset   Stroke Maternal Grandfather     Social History   Tobacco Use   Smoking status: Never   Smokeless tobacco: Never  Vaping Use   Vaping Use: Never used  Substance Use Topics   Alcohol use: No   Drug use: No    Home Medications Prior to Admission medications   Medication Sig Start Date End Date Taking? Authorizing Provider  acetaminophen (TYLENOL) 500 MG tablet Take 1,000 mg by mouth every 6 (six) hours as needed for moderate pain or headache.   Yes [provider]  aspirin EC 81 MG EC tablet Take 1 tablet (81 mg total) by mouth daily. Swallow whole. 05/17/21 06/16/21 Yes Demaio, Alexa, MD  atorvastatin (LIPITOR) 40 MG tablet Take 1 tablet (40 mg total) by mouth at bedtime. 05/17/21 06/16/21 Yes Demaio, Alexa, MD  ELIQUIS 5 MG TABS tablet TAKE 1 TABLET BY MOUTH TWICE A DAY Patient taking differently: Take 5 mg by mouth 2 (two) times daily. 02/08/21  Yes Camnitz, Will Hassell Done, MD  KLOR-CON M20 20 MEQ tablet Take 20 mEq by mouth  daily. 05/16/21  Yes [provider]  levETIRAcetam (KEPPRA) 1000 MG tablet Take 1 tablet (1,000 mg total) by mouth 2 (two) times daily. 05/18/21 07/17/21 Yes Demaio, Alexa, MD  Multiple Vitamin (MULTIVITAMIN WITH MINERALS) TABS tablet Take 1 tablet by mouth daily.   Yes [provider]  omeprazole (PRILOSEC) 40 MG capsule Take 40 mg by mouth daily. 07/28/20  Yes [provider]  ondansetron (ZOFRAN-ODT) 4 MG disintegrating tablet Take 1 tablet (4 mg total) by mouth every 8 (eight) hours as needed for up to 10 doses for nausea or vomiting. 06/01/21  Yes Elenora Hawbaker, Carola Rhine, MD  Pseudoephedrine-APAP-DM (DAYQUIL PO) Take 1 capsule by mouth daily as needed (coldy symptoms).   Yes [provider]  ursodiol (ACTIGALL) 250 MG tablet Take 250 mg by mouth 2 (two) times daily. 07/28/20  Yes [provider]    Allergies    Iodinated contrast  media, Doxycycline, Sulfa antibiotics, Codeine, Hydrocodone bit-homatrop mbr, and Ace inhibitors  Review of Systems   Review of Systems  Constitutional:  Negative for chills and fever.  HENT:  Positive for congestion.   Respiratory:  Positive for cough. Negative for shortness of breath.   Cardiovascular:  Negative for chest pain and palpitations.  Gastrointestinal:  Positive for nausea and vomiting. Negative for abdominal pain.  Neurological:  Positive for dizziness, light-headedness and headaches.   Physical Exam Updated Vital Signs BP 124/63    Pulse 62    Temp 98.1 F (36.7 C) (Oral)    Resp (!) 21    Ht 5\' 2"  (1.575 m)    Wt 71.2 kg    LMP 09/28/2012    SpO2 96%    BMI 28.71 kg/m   Physical Exam Constitutional:      General: She is not in acute distress. HENT:     Head: Normocephalic and atraumatic.  Eyes:     Conjunctiva/sclera: Conjunctivae normal.     Pupils: Pupils are equal, round, and reactive to light.  Cardiovascular:     Rate and Rhythm: Normal rate and regular rhythm.  Pulmonary:     Effort:  Pulmonary effort is normal. No respiratory distress.  Abdominal:     General: There is no distension.     Tenderness: There is no abdominal tenderness.  Skin:    General: Skin is warm and dry.  Neurological:     General: No focal deficit present.     Mental Status: She is alert. Mental status is at baseline.     Comments: No gaze deficits or palsy Stuttering speech, word confusion Moving all extremities on command No facial droop    ED Results / Procedures / Treatments   Labs (all labs ordered are listed, but only abnormal results are displayed) Labs Reviewed  RESP PANEL BY RT-PCR (FLU A&B, COVID) ARPGX2 - Abnormal; Notable for the following components:      Result Value   SARS Coronavirus 2 by RT PCR POSITIVE (*)    All other components within normal limits  BASIC METABOLIC PANEL - Abnormal; Notable for the following components:   CO2 21 (*)    Creatinine, Ser 1.44 (*)    Calcium 8.8 (*)    GFR, Estimated 40 (*)    All other components within normal limits  CBC WITH DIFFERENTIAL/PLATELET - Abnormal; Notable for the following components:   WBC 3.0 (*)    RBC 3.85 (*)    Hemoglobin 10.8 (*)    HCT 33.7 (*)    Neutro Abs 1.6 (*)    All other components within normal limits  TROPONIN I (HIGH SENSITIVITY)    EKG None  Radiology CT HEAD WO CONTRAST (5MM)  Result Date: 06/01/2021 CLINICAL DATA:  Stroke follow-up. Vertigo, vomiting, altered mental status, and slurred speech. EXAM: CT HEAD WITHOUT CONTRAST TECHNIQUE: Contiguous axial images were obtained from the base of the skull through the vertex without intravenous contrast. COMPARISON:  Head CT 05/15/2021 and MRI 05/16/2021 FINDINGS: Brain: A subacute infarct in the right temporoparietal junction region is similar in size to the prior studies but demonstrates new gyral hyperdensity which may reflect petechial hemorrhage and/or mineralization. Additional punctate bilateral cerebral infarcts on the prior MRI are not well seen  by CT. A moderate-sized chronic left MCA infarct and small chronic right cerebellar infarct are again noted. There is mild ex vacuo dilatation of the left lateral ventricle. No definite new infarct, mass, midline shift, or  extra-axial fluid collection is identified. Background mild chronic small vessel ischemia is noted in the cerebral white matter. Vascular: Calcified atherosclerosis at the skull base. Left MCA stent. Skull: No fracture or suspicious osseous lesion. Sinuses/Orbits: Mild mucosal thickening in the maxillary sinuses. Clear mastoid air cells. Unremarkable orbits. Other: None. IMPRESSION: 1. Evolving subacute right temporoparietal infarct with new petechial hemorrhage and/or mineralization. 2. Chronic left MCA and right cerebellar infarcts. Electronically Signed   By: Logan Bores M.D.   On: 06/01/2021 13:36   MR ANGIO HEAD WO CONTRAST  Result Date: 06/01/2021 CLINICAL DATA:  Stroke, follow-up.  Vertigo, central. EXAM: MRI HEAD WITHOUT CONTRAST MRA HEAD WITHOUT CONTRAST MRA OF THE NECK WITHOUT AND WITH CONTRAST TECHNIQUE: Multiplanar, multi-echo pulse sequences of the brain and surrounding structures were acquired without intravenous contrast. Angiographic images of the Circle of Willis were acquired using MRA technique without intravenous contrast. Angiographic images of the neck were acquired using MRA technique without and with intravenous contrast. Carotid stenosis measurements (when applicable) are obtained utilizing NASCET criteria, using the distal internal carotid diameter as the denominator. CONTRAST:  7 mL Gadavist COMPARISON:  Head CT 06/01/2021. Head MRI 05/16/2021. Head and neck MRA 05/10/2021. FINDINGS: MR HEAD FINDINGS Brain: There are subacute infarcts in the bilateral temporoparietal regions and posterior left frontal cortex which are unchanged in extent from the prior MRI and demonstrate persistent but overall decreased diffusion weighted signal abnormality. New gyral  susceptibility artifact associated with the right temporoparietal infarct corresponds to hyperdensity on CT and may reflect petechial hemorrhage and/or mineralization. No definite new infarct is identified. A moderate-sized chronic left MCA infarct is again noted with associated chronic blood products. T2 hyperintensities elsewhere in the cerebral white matter bilaterally are unchanged from the prior MRI and are nonspecific but compatible with chronic small vessel ischemia. A small chronic right cerebellar infarct is again noted. There is mild ex vacuo dilatation of the left lateral ventricle. No mass, midline shift, or extra-axial fluid collection is evident. Vascular: Major intracranial vascular flow voids are preserved. Skull and upper cervical spine: Unremarkable bone marrow signal para Sinuses/Orbits: Unremarkable orbits. Mild mucosal thickening in the paranasal sinuses. Clear mastoid air cells. Other: None. MRA HEAD FINDINGS Anterior circulation: The right intracranial internal carotid artery is patent with an unchanged mild distal supraclinoid stenosis. Artifact from a left M1 stent obscures the left ICA terminus with the intracranial ICA being widely patent more proximally. Flow in the left MCA stent cannot be directly evaluated on MRA, however flow is present in left M2 and more distal branch vessels with a severe distal left M2 stenosis again seen. The ACAs and right MCA are patent with unchanged severe stenoses at the right MCA bifurcation and in the proximal right A1 segment. The left A1 origin is obscured by artifact from the MCA stent. No aneurysm is identified. Posterior circulation: The intracranial vertebral arteries are widely patent to the basilar. The right PICA and left AICA appear dominant. Patent SCA is are seen bilaterally. The basilar artery is widely patent. There are large posterior communicating arteries and hypoplastic P1 segments bilaterally. Both PCAs are patent without evidence of a  significant proximal stenosis. No aneurysm is identified. MRA NECK FINDINGS The patient became uncooperative, pulling at the IV, resulting in suboptimal timing on the contrast-enhanced MRA. The study is still diagnostic. Aortic arch: Normal variant aortic arch branching pattern with common origin of the brachiocephalic and left common carotid arteries. Wide patency of the brachiocephalic and right subclavian arteries. Suspected motion  artifact through the left subclavian origin. Right carotid system: Patent without evidence of significant stenosis or dissection. Left carotid system: Patent without evidence of significant stenosis or dissection. Vertebral arteries: Patent and codominant with antegrade flow bilaterally and no evidence of a significant stenosis or dissection. IMPRESSION: 1. No acute infarct. 2. Evolving subacute bilateral temporoparietal and posterior left frontal infarcts. 3. Chronic small vessel ischemia and old infarcts as above. 4. Unchanged intracranial atherosclerosis including severe stenoses of the right MCA bifurcation, proximal right ACA, and distal left M2 segment. 5. Wide patency of the carotid arteries and vertebral arteries. Electronically Signed   By: Logan Bores M.D.   On: 06/01/2021 16:33   MR Angiogram Neck W or Wo Contrast  Result Date: 06/01/2021 CLINICAL DATA:  Stroke, follow-up.  Vertigo, central. EXAM: MRI HEAD WITHOUT CONTRAST MRA HEAD WITHOUT CONTRAST MRA OF THE NECK WITHOUT AND WITH CONTRAST TECHNIQUE: Multiplanar, multi-echo pulse sequences of the brain and surrounding structures were acquired without intravenous contrast. Angiographic images of the Circle of Willis were acquired using MRA technique without intravenous contrast. Angiographic images of the neck were acquired using MRA technique without and with intravenous contrast. Carotid stenosis measurements (when applicable) are obtained utilizing NASCET criteria, using the distal internal carotid diameter as the  denominator. CONTRAST:  7 mL Gadavist COMPARISON:  Head CT 06/01/2021. Head MRI 05/16/2021. Head and neck MRA 05/10/2021. FINDINGS: MR HEAD FINDINGS Brain: There are subacute infarcts in the bilateral temporoparietal regions and posterior left frontal cortex which are unchanged in extent from the prior MRI and demonstrate persistent but overall decreased diffusion weighted signal abnormality. New gyral susceptibility artifact associated with the right temporoparietal infarct corresponds to hyperdensity on CT and may reflect petechial hemorrhage and/or mineralization. No definite new infarct is identified. A moderate-sized chronic left MCA infarct is again noted with associated chronic blood products. T2 hyperintensities elsewhere in the cerebral white matter bilaterally are unchanged from the prior MRI and are nonspecific but compatible with chronic small vessel ischemia. A small chronic right cerebellar infarct is again noted. There is mild ex vacuo dilatation of the left lateral ventricle. No mass, midline shift, or extra-axial fluid collection is evident. Vascular: Major intracranial vascular flow voids are preserved. Skull and upper cervical spine: Unremarkable bone marrow signal para Sinuses/Orbits: Unremarkable orbits. Mild mucosal thickening in the paranasal sinuses. Clear mastoid air cells. Other: None. MRA HEAD FINDINGS Anterior circulation: The right intracranial internal carotid artery is patent with an unchanged mild distal supraclinoid stenosis. Artifact from a left M1 stent obscures the left ICA terminus with the intracranial ICA being widely patent more proximally. Flow in the left MCA stent cannot be directly evaluated on MRA, however flow is present in left M2 and more distal branch vessels with a severe distal left M2 stenosis again seen. The ACAs and right MCA are patent with unchanged severe stenoses at the right MCA bifurcation and in the proximal right A1 segment. The left A1 origin is obscured  by artifact from the MCA stent. No aneurysm is identified. Posterior circulation: The intracranial vertebral arteries are widely patent to the basilar. The right PICA and left AICA appear dominant. Patent SCA is are seen bilaterally. The basilar artery is widely patent. There are large posterior communicating arteries and hypoplastic P1 segments bilaterally. Both PCAs are patent without evidence of a significant proximal stenosis. No aneurysm is identified. MRA NECK FINDINGS The patient became uncooperative, pulling at the IV, resulting in suboptimal timing on the contrast-enhanced MRA. The study is still  diagnostic. Aortic arch: Normal variant aortic arch branching pattern with common origin of the brachiocephalic and left common carotid arteries. Wide patency of the brachiocephalic and right subclavian arteries. Suspected motion artifact through the left subclavian origin. Right carotid system: Patent without evidence of significant stenosis or dissection. Left carotid system: Patent without evidence of significant stenosis or dissection. Vertebral arteries: Patent and codominant with antegrade flow bilaterally and no evidence of a significant stenosis or dissection. IMPRESSION: 1. No acute infarct. 2. Evolving subacute bilateral temporoparietal and posterior left frontal infarcts. 3. Chronic small vessel ischemia and old infarcts as above. 4. Unchanged intracranial atherosclerosis including severe stenoses of the right MCA bifurcation, proximal right ACA, and distal left M2 segment. 5. Wide patency of the carotid arteries and vertebral arteries. Electronically Signed   By: Logan Bores M.D.   On: 06/01/2021 16:33   MR BRAIN WO CONTRAST  Result Date: 06/01/2021 CLINICAL DATA:  Stroke, follow-up.  Vertigo, central. EXAM: MRI HEAD WITHOUT CONTRAST MRA HEAD WITHOUT CONTRAST MRA OF THE NECK WITHOUT AND WITH CONTRAST TECHNIQUE: Multiplanar, multi-echo pulse sequences of the brain and surrounding structures were  acquired without intravenous contrast. Angiographic images of the Circle of Willis were acquired using MRA technique without intravenous contrast. Angiographic images of the neck were acquired using MRA technique without and with intravenous contrast. Carotid stenosis measurements (when applicable) are obtained utilizing NASCET criteria, using the distal internal carotid diameter as the denominator. CONTRAST:  7 mL Gadavist COMPARISON:  Head CT 06/01/2021. Head MRI 05/16/2021. Head and neck MRA 05/10/2021. FINDINGS: MR HEAD FINDINGS Brain: There are subacute infarcts in the bilateral temporoparietal regions and posterior left frontal cortex which are unchanged in extent from the prior MRI and demonstrate persistent but overall decreased diffusion weighted signal abnormality. New gyral susceptibility artifact associated with the right temporoparietal infarct corresponds to hyperdensity on CT and may reflect petechial hemorrhage and/or mineralization. No definite new infarct is identified. A moderate-sized chronic left MCA infarct is again noted with associated chronic blood products. T2 hyperintensities elsewhere in the cerebral white matter bilaterally are unchanged from the prior MRI and are nonspecific but compatible with chronic small vessel ischemia. A small chronic right cerebellar infarct is again noted. There is mild ex vacuo dilatation of the left lateral ventricle. No mass, midline shift, or extra-axial fluid collection is evident. Vascular: Major intracranial vascular flow voids are preserved. Skull and upper cervical spine: Unremarkable bone marrow signal para Sinuses/Orbits: Unremarkable orbits. Mild mucosal thickening in the paranasal sinuses. Clear mastoid air cells. Other: None. MRA HEAD FINDINGS Anterior circulation: The right intracranial internal carotid artery is patent with an unchanged mild distal supraclinoid stenosis. Artifact from a left M1 stent obscures the left ICA terminus with the  intracranial ICA being widely patent more proximally. Flow in the left MCA stent cannot be directly evaluated on MRA, however flow is present in left M2 and more distal branch vessels with a severe distal left M2 stenosis again seen. The ACAs and right MCA are patent with unchanged severe stenoses at the right MCA bifurcation and in the proximal right A1 segment. The left A1 origin is obscured by artifact from the MCA stent. No aneurysm is identified. Posterior circulation: The intracranial vertebral arteries are widely patent to the basilar. The right PICA and left AICA appear dominant. Patent SCA is are seen bilaterally. The basilar artery is widely patent. There are large posterior communicating arteries and hypoplastic P1 segments bilaterally. Both PCAs are patent without evidence of a significant proximal  stenosis. No aneurysm is identified. MRA NECK FINDINGS The patient became uncooperative, pulling at the IV, resulting in suboptimal timing on the contrast-enhanced MRA. The study is still diagnostic. Aortic arch: Normal variant aortic arch branching pattern with common origin of the brachiocephalic and left common carotid arteries. Wide patency of the brachiocephalic and right subclavian arteries. Suspected motion artifact through the left subclavian origin. Right carotid system: Patent without evidence of significant stenosis or dissection. Left carotid system: Patent without evidence of significant stenosis or dissection. Vertebral arteries: Patent and codominant with antegrade flow bilaterally and no evidence of a significant stenosis or dissection. IMPRESSION: 1. No acute infarct. 2. Evolving subacute bilateral temporoparietal and posterior left frontal infarcts. 3. Chronic small vessel ischemia and old infarcts as above. 4. Unchanged intracranial atherosclerosis including severe stenoses of the right MCA bifurcation, proximal right ACA, and distal left M2 segment. 5. Wide patency of the carotid arteries  and vertebral arteries. Electronically Signed   By: Logan Bores M.D.   On: 06/01/2021 16:33    Procedures Procedures   Medications Ordered in ED Medications  gadobutrol (GADAVIST) 1 MMOL/ML injection 7 mL (7 mLs Intravenous Contrast Given 06/01/21 1606)    ED Course  I have reviewed the triage vital signs and the nursing notes.  Pertinent labs & imaging results that were available during my care of the patient were reviewed by me and considered in my medical decision making (see chart for details).  This patient complains of lightheadedness, dizziness, nausea, vomiting.  This involves an extensive number of treatment options, and is a complaint that carries with it a high risk of complications and morbidity.  The differential diagnosis includes CVA versus viral syndrome versus other infection versus other  I ordered, reviewed, and interpreted labs. BMP and CBC near recent baseline levels, Trop 6, Covid positive, flu negative I ordered imaging studies which included MRI brain and MRA head and neck I independently visualized and interpreted imaging as noted below, and the monitor tracing which showed NSR Additional history was obtained from patient's brother and daughter at bedside Previous records obtained and reviewed showing recent neuroimaging, hospital course and discharge in Dec 2022 I consulted neurology and discussed lab and imaging findings  After the interventions stated above, I reevaluated the patient and found she remained at baseline, no new neuro deficits.    Clinical Course as of 06/01/21 1736  Thu Jun 01, 2021  1234 Dr Theda Sers at bedside, recommending [facility], unable for CTA due to high iodine allergies.  Pt not candidate for tNK with her eliquis use.  No LVO symptoms at this time. [MT]  1356 Dr Theda Sers requesting MR brain and MRA head and neck, ordered, patient and her brother at bedside updated and in agreement with plan. [MT]  1402 SARS Coronavirus 2 by RT PCR(!):  POSITIVE [MT]  1649 Patient remains asymptomatic and at her baseline mental state per her brother's report.  She has no active symptoms.  Per my discussion earlier with neurology, as well as review of the MRI, there does not appear to be any acute infarct, likely evolving prior infarcts.  I suspect her symptoms are most likely related to [place].  Return precautions were discussed with the patient and her family.  Okay for discharge [MT]    Clinical Course User Index [MT] Jalise Zawistowski, Carola Rhine, MD     Final Clinical Impression(s) / ED Diagnoses Final diagnoses:  COVID-19  Near syncope    Rx / DC Orders ED Discharge Orders  Ordered    ondansetron (ZOFRAN-ODT) 4 MG disintegrating tablet  Every 8 hours PRN        06/01/21 1651             [name], MD 06/01/21 1736

## 2021-06-01 NOTE — Discharge Instructions (Addendum)
You were diagnosed with COVID-19 today in the emergency department.  This may be causing the symptoms that you had, including dizziness, nausea and vomiting, and in some cases can even cause brief loss of consciousness. COVID typically causes sickness for 5 days on average, but can last up to 10 days in some people.  I prescribed you Zofran, which is a nausea medicine, to your pharmacy to take as needed.  You can also take over-the-counter medications for cold and flu as needed  Because of your stroke history, our neurologist recommended repeating MRI scans of your brain.  The scans thankfully did not show any signs of any large new stroke, but does show continued expected changes to your brain after your recent stroke.  We talked about the possibility that viruses can bring back-year-old stroke symptoms.  This is very common.  However, if you experience any new symptoms, including weakness in your arms or legs, worsening speech difficulty, severe headache, or new loss of feeling in your body, you need to come back to the ER.  New stroke symptoms are always concerning for new strokes, and need immediate attention.

## 2021-06-09 ENCOUNTER — Other Ambulatory Visit: Payer: Self-pay

## 2021-06-09 ENCOUNTER — Other Ambulatory Visit: Payer: Self-pay | Admitting: *Deleted

## 2021-06-09 NOTE — Patient Outreach (Signed)
[facility]) Care Management Telephonic RN Care Manager Note   06/13/2021 Name:  Courtney Burke MRN:  [id number] DOB:  30-May-1956  Summary: RN CM outreach to assess for worsening symptom, care coordination, disease management needs Confirms left side monitoring to try r/o Afib possible cause of stroke Pt found positive for covid with a noted "episode" (syncope) during speech therapy session -  daughter called [facility] 24 hr nurse call center and patient taken to ED  Daughter informed of patient's history of 4 strokes she was not of aware of by hospital staff Had syncope today 06/09/21 with over exertion Has not seen pcp since discharge related to covid symptoms Provide encouragement to daughter  Recommendations/Changes made from today's visit: Call attempt to pcp office [phone] to inquire about a possible assigned cardiologist for patient -extended wait time without an answer  This was discussed with daughter  Reviewed medical progression since last outreach Questions answered Encouragement provided to daughter Daughter to be e-mailed a list of in network medical providers as discussed   Subjective: Courtney Burke is an 66 y.o. year old female who is a primary patient of [name], PA-C. The care management team was consulted for assistance with care management and/or care coordination needs.   Mrs Courtney Burke was referred to [facility] on 05/23/21 for EMMI stroke red alert after she was outreached on 05/22/21 and voiced a loss of interest in things she use to enjoy and depression symptoms. This was reported to be related to her residual speech difficulties. She is presently in outpatient speech therapy on Wednesdays and Thursdays since 05/31/21.   Telephonic RN Care Manager completed Telephone Visit today.   Objective:  Medications Reviewed Today     Reviewed by [name], RN (Registered Nurse) on 06/09/21 at 66  Med List Status:  <None>   Medication Order Taking? Sig Documenting Provider Last Dose Status Informant  acetaminophen (TYLENOL) 500 MG tablet [id number] No Take 1,000 mg by mouth every 6 (six) hours as needed for moderate pain or headache. [provider] 05/31/2021 Active Child  aspirin EC 81 MG EC tablet [id number] No Take 1 tablet (81 mg total) by mouth daily. Swallow whole. [name], MD 06/01/2021 Active Child  atorvastatin (LIPITOR) 40 MG tablet [id number] No Take 1 tablet (40 mg total) by mouth at bedtime. [name], MD 05/31/2021 Active Child  ELIQUIS 5 MG TABS tablet [id number] No TAKE 1 TABLET BY MOUTH TWICE A DAY  Patient taking differently: Take 5 mg by mouth 2 (two) times daily.   [name], MD 06/01/2021 Active Child  KLOR-CON M20 20 MEQ tablet [id number] No Take 20 mEq by mouth daily. [provider] 06/01/2021 Active Child  levETIRAcetam (KEPPRA) 1000 MG tablet [id number] No Take 1 tablet (1,000 mg total) by mouth 2 (two) times daily. [name], MD 06/01/2021 Active Child  Multiple Vitamin (MULTIVITAMIN WITH MINERALS) TABS tablet [id number] No Take 1 tablet by mouth daily. [provider] 06/01/2021 Active Child  omeprazole (PRILOSEC) 40 MG capsule [id number] No Take 40 mg by mouth daily. [provider] 06/01/2021 Active Child  ondansetron (ZOFRAN-ODT) 4 MG disintegrating tablet [id number]  Take 1 tablet (4 mg total) by mouth every 8 (eight) hours as needed for up to 10 doses for nausea or vomiting. [name], MD  Active   Pseudoephedrine-APAP-DM (DAYQUIL PO) [id number] No Take 1 capsule by mouth daily as needed (coldy symptoms). [provider] 06/01/2021 Active Child  ursodiol (ACTIGALL) 250 MG tablet [id number] No Take 250 mg by mouth 2 (two) times daily. 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 ([place]) 06/30/2020   Gallstones 08/18/2019   Esophagitis 07/20/2019   Erosive gastritis 07/20/2019   Nausea vomiting and diarrhea 07/16/2019   Chronic bilateral thoracic back pain 06/05/2018   Large breasts 06/05/2018   Seasonal allergic rhinitis due to pollen 05/09/2017   Microcalcifications of the breast 07/02/2016   Mild intermittent asthma without complication 90/30/0923   Severe single current episode of major depressive disorder, without psychotic features ([place]) 05/08/2016   Vitamin D deficiency 05/08/2016   Persistent headaches 02/16/2016   Age-related nuclear cataract of both eyes 09/28/2015   Asthma 09/28/2015   Essential hypertension 09/28/2015   Essential thrombocytosis ([place]) 09/28/2015   Gastroesophageal reflux disease without esophagitis 09/28/2015   Keratoconjunctivitis sicca of both eyes not specified as Sjogren's 09/28/2015   Status post gastric bypass for obesity 09/28/2015   Stage 3a chronic kidney disease ([place]) 09/28/2015   Hot flashes 09/28/2015   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   IFG (impaired fasting glucose) 09/28/2015   Menopausal disorder 09/28/2015   Obesity (BMI 30-39.9) 09/28/2015   Non-smoker 09/28/2015   Pelvic pain in female 09/28/2015   Presbyopia of both eyes 09/28/2015   Snoring 09/28/2015   Tinea corporis 09/28/2015   Osteopenia 07/15/2015   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   Morbid obesity ([place]) 05/21/2013   Hyperlipidemia, unspecified 03/16/2013   Past Medical History:  Diagnosis Date   Acute ischemic left MCA stroke ([place]) 06/30/2020   Acute ischemic stroke (H[facility])    Acute stroke due to occlusion of left middle cerebral  artery ([place]) 06/30/2020   Age-related nuclear cataract of both eyes 09/28/2015   Arthralgia of multiple joints 09/28/2015   Asthma    Borderline diabetes    Cerebrovascular accident (CVA) ([place])    Chronic bilateral thoracic back pain 06/05/2018   Chronic renal insufficiency, stage 1 08/11/2014   Diabetes mellitus type 2 in obese ([place])    Dyslipidemia    Dysphagia, post-stroke    Erosive gastritis 07/20/2019   Formatting of this note might be different from the original. On EGD 07/2019   Esophagitis 07/20/2019   Formatting of this note might be different from the original. Added automatically from request for surgery [id number]  Formatting of this note might be different from the original. On EGD 07/2019   Essential hypertension 09/28/2015   Essential thrombocytosis ([place]) 09/28/2015   Gallstones 08/18/2019   Formatting of this note might be different from the original. 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   Care Plan : RN Care Manager Plan of Care  Updates made by [name], RN since 06/13/2021 12:00 AM     Problem: Complex Care Coordination Needs and disease management in patient with stroke   Priority: High  Onset Date: 05/24/2021      Long-Range Goal: Establish Plan of Care for Management Complex SDOH Barriers, disease management and Care Coordination Needs in patient with stroke   Start Date: 05/24/2021  This Visit's Progress: On track  Priority: High  Note:   Current Barriers:  Knowledge Deficits related to plan of care for management of Stroke  Care Coordination needs related to Limited education about stroke* PMH 4 previous strokes Residual speech difficulties  Barriers: Health Behaviors Knowledge  RNCM Clinical Goal(s):  Patient will verbalize understanding of plan for management of stroke as evidenced by prevention of re admission in next 30 days  through collaboration with RN Care manager, provider, and care team.   Interventions: Outreaches to provide care coordination and disease management/education Inter-disciplinary care team collaboration (see longitudinal plan of care) Evaluation of current treatment plan related to  self management and patient's adherence to plan as established by provider 06/13/21 sent e-mail to daughter with list of in network [employer] medicare providers as discussed    Stroke:  (Status:New goal.) Long Term Goal Reviewed Importance of taking all medications as prescribed Reviewed Importance of attending all scheduled provider appointments Advised to report any changes in symptoms or exercise tolerance Screening for signs and symptoms of depression related to chronic disease state Assessed social determinant of health barriers Reviewed referrals to outpatient therapy Assessed for fall status and safety in the home  Patient Goals/Self-Care Activities: Take all medications as prescribed Attend all scheduled provider appointments Attend church or other social activities Perform all self care activities independently  Call provider office for new concerns or questions  Attend speech therapy sessions as ordered  Follow Up Plan:  The patient has been provided with  contact information for the care management team and has been advised to call with any health related questions or concerns.  The care management team will reach out to the patient again over the next 30 business days.       Plan: The patient has been provided with contact information for the care management team and has been advised to call with any health related questions or concerns.  The care management team will reach out to the patient again over the next 30 business days.  Marquavius Scaife L. Lavina Hamman, RN, BSN, [place] Coordinator Office number [phone] [facility] number [phone] Fax number [phone]

## 2021-06-15 ENCOUNTER — Other Ambulatory Visit: Payer: Self-pay

## 2021-06-15 ENCOUNTER — Other Ambulatory Visit: Payer: Self-pay | Admitting: *Deleted

## 2021-06-15 ENCOUNTER — Telehealth: Payer: Self-pay

## 2021-06-15 NOTE — Telephone Encounter (Signed)
I spoke with Maudie Mercury with [employer] and the patient daughter. The patient has had several episodes and strokes in the past few months. They wanted to know if anything was picked up on the patient loop recorder. I gave the daughter the number to tech support and device clinic number to speak with someone. I told them I would check [facility] in the morning to see if a transmission came across.

## 2021-06-15 NOTE — Patient Outreach (Signed)
[facility]) Care Management Telephonic RN Care Manager Note   06/16/2021 Name:  Courtney Burke MRN:  [id number] DOB:  05-Nov-1955  Summary: Follow up outreach to daughter to update her on outreach with messages left for pcp and cardiac monitoring office.  Assessed if list of providers received from  [facility]) She did not receive the lists  Patient is reported to continue to have moments of anxiety (Hot water heater concerns but resolved by ex husband) Still has sweating on nose, shortness of breath, pacing, body sweats but vss wnl cbg wnl per daughter  Courtney Burke has been helping her to manage this episode with a fan, talking to pt to calm her and having her to rest and breath    Recommendations/Changes made from today's visit:  Inquired about previous counseling and medication therapy Patient has not received either as far as daughter is aware 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 Discussed [facility] SW services   Educated on how to link my chart accounts  Step by step instruction provided to assist daughter with linking patient medical accounts and appointments on my chart with success Discussed upcoming appointments to include speech, neurology and rehab MD Review previous stroke history  Re -emailed lists of [facility] providers via [facility] site and via secured [facility] site to daughter - She was able to receive the secured [facility] site e-mail  Spoke with [place], [place] [phone] Need for her to connect with the monitor's app and there has not been any results since November 2022 Dr Baird Kay put in CV physiology [place] [phone] and [place] support [phone] given to daughter for the app on the patient's phone to check for  recent possible activity   Subjective: Courtney Burke is an 66 y.o. year old female who is a primary patient of Courtney Octave, PA-C. The care management team was consulted for assistance with care management and/or care coordination needs.    Telephonic RN Care Manager completed Telephone Visit today.   Objective:  Medications Reviewed Today     Reviewed by Courtney Faster, RN (Registered Nurse) on 06/09/21 at 66  Med List Status: <None>   Medication Order Taking? Sig Documenting Provider Last Dose Status Informant  acetaminophen (TYLENOL) 500 MG tablet [id number] No Take 1,000 mg by mouth every 6 (six) hours as needed for moderate pain or headache. [provider] 05/31/2021 Active Child  aspirin EC 81 MG EC tablet [id number] No Take 1 tablet (81 mg total) by mouth daily. Swallow whole. [name], MD 06/01/2021 Active Child  atorvastatin (LIPITOR) 40 MG tablet [id number] No Take 1 tablet (40 mg total) by mouth at bedtime. [name], MD 05/31/2021 Active Child  ELIQUIS 5 MG TABS tablet [id number] No TAKE 1 TABLET BY MOUTH TWICE A DAY  Patient taking differently: Take 5 mg by mouth 2 (two) times daily.   [name], MD 06/01/2021 Active Child  KLOR-CON M20 20 MEQ tablet [id number] No Take 20 mEq by mouth daily. [provider] 06/01/2021 Active Child  levETIRAcetam (KEPPRA) 1000 MG tablet [id number] No Take 1 tablet (1,000 mg total) by mouth 2 (two) times daily. [name], MD 06/01/2021 Active Child  Multiple Vitamin (MULTIVITAMIN WITH MINERALS) TABS tablet  [id number] No Take 1 tablet by mouth daily. [provider] 06/01/2021 Active Child  omeprazole (PRILOSEC) 40 MG capsule [id number] No Take 40 mg by mouth daily. [provider] 06/01/2021 Active Child  ondansetron (ZOFRAN-ODT) 4 MG disintegrating tablet [id number]  Take 1 tablet (4 mg total) by mouth every 8 (eight) hours as needed for up to 10 doses for nausea or  vomiting. Courtney Dusky, MD  Active   Pseudoephedrine-APAP-DM (DAYQUIL PO) [id number] No Take 1 capsule by mouth daily as needed (coldy symptoms). [provider] 06/01/2021 Active Child  ursodiol (ACTIGALL) 250 MG tablet [id number] No Take 250 mg by mouth 2 (two) times daily. [provider] 06/01/2021 Active Child           Past Medical History:  Diagnosis Date   Acute ischemic left MCA stroke ([place]) 06/30/2020   Acute ischemic stroke ([place])    Acute stroke due to occlusion of left middle cerebral artery ([place]) 06/30/2020   Age-related nuclear cataract of both eyes 09/28/2015   Arthralgia of multiple joints 09/28/2015   Asthma    Borderline diabetes    Cerebrovascular accident (CVA) ([place])    Chronic bilateral thoracic back pain 06/05/2018   Chronic renal insufficiency, stage 1 08/11/2014   Diabetes mellitus type 2 in obese ([place])    Dyslipidemia    Dysphagia, post-stroke    Erosive gastritis 07/20/2019   Formatting of this note might be different from the original. On EGD 07/2019   Esophagitis 07/20/2019   Formatting of this note might be different from the original. Added automatically from request for surgery [id number]  Formatting of this note might be different from the original. On EGD 07/2019   Essential hypertension 09/28/2015   Essential thrombocytosis ([place]) 09/28/2015   Gallstones 08/18/2019   Formatting of this note might be different from the original. 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   Care Plan : RN Care Manager Plan of Care  Updates made by Courtney Faster, RN since 06/16/2021 12:00 AM     Problem: Complex Care Coordination Needs and disease management in patient with stroke   Priority: High  Onset Date: 05/24/2021     Long-Range Goal: Establish Plan of Care for Management Complex SDOH Barriers, disease management and Care Coordination Needs in patient with stroke   Start Date: 05/24/2021  Recent Progress: On track  Priority: High  Note:   Current Barriers:  Knowledge Deficits related to plan of care for management of Stroke  Care Coordination needs related to Limited education about stroke, coping with speech difficulties, follow up appointments, my chart access, cardiology/neurology providers* PMH 4 previous strokes Residual speech difficulties  Barriers: Health Behaviors Knowledge Other Coping, Changes in primary care office   RN CM Clinical Goal(s):  Patient/daughter will verbalize understanding of  plan for management of stroke as evidenced by prevention of re admission in next 30 days  through collaboration with RN Care manager, provider, and care team.  Pt did return to ED on 06/01/21 Dx with covid- discharged home  Interventions: Outreaches to provide care coordination and disease management/education Inter-disciplinary care team collaboration (see longitudinal plan of care) Evaluation of current treatment plan related to  self management and patient's adherence to plan as established by provider 06/13/21 sent e-mail to daughter with list of in network united healthcare medicare providers as discussed - Sent/faxed letter to pcp with identified needs for pt 06/15/21  Outreaches to pcp/Loop monitor staff- messages left; step/step telephonic education to daughter to link my chart accounts and appointments- Reviewed upcoming speech and rehab appointments 06/16/21 collaboration with pcp office [name]). Clarified her pcp on maternity leave and pt last saw [name] PA and to see her again. Had her to updated contact number to now outreach daughter primarily, to schedule follow office visit for cardiac clearance for colonoscopy, post hospital follow up, referral to ([facility]) counselor for coping concerns, referral to cardiology prn- Left RN CM office number  sent notes and letters previously sent to [place] to [place] later noted an appointment was scheduled in [facility] for pt to see pcp staff [name], PA on 06/23/20 Re emailed a list of [place] providers to daughter while she was on the phone Connected daughter and loop monitor staff [name] [phone] to review any possible noted abnormalities. [name] confirms pt loop recorder has not been remotely monitored since 02/2021 related to app error. Tech support info provided to daughter Dr Baird Kay is the Cardiology physiologist, no cardiology    Stroke:  (Status:Goal on track:  Yes.) Long Term Goal Reviewed Importance of taking all medications as prescribed Reviewed Importance of attending all scheduled provider appointments Advised to report any changes in symptoms or exercise tolerance Screening for signs and symptoms of depression related to chronic disease state Assessed social determinant of health barriers Reviewed referrals to outpatient therapy Assessed for fall status and safety in the home Reviewed the stroke history RN CM was able to find in EPIC with daughter CVAs in 1/22, 2/22, 12/22  Patient Goals/Self-Care Activities: Take all medications as prescribed Attend all scheduled provider appointments Attend church or other social activities Perform all self care activities independently   Call provider office for new concerns or questions  Attend speech therapy sessions as ordered  Follow Up Plan:  The patient has been provided with contact information for the care management team and has been advised to call with any health related questions or concerns.  The care management team will reach out to the patient again over the next 30 business days.       Plan: The patient has been provided with contact information for the care management team and has been advised to call with any health related questions or concerns.  The care management team will reach out to the patient again over the next 30  business days.  Shawnay Bramel L. Lavina Hamman, RN, BSN, [place] Coordinator Office number [phone] Main [facility] number [phone] Fax number [phone]

## 2021-06-16 ENCOUNTER — Other Ambulatory Visit: Payer: Self-pay | Admitting: *Deleted

## 2021-06-16 NOTE — Patient Outreach (Addendum)
[facility]) Care Management  06/16/2021  Courtney Burke 07-13-1955 [id number]  [facility] Care coordination- primary care provider office   RN CM outreach to primary care provider (PCP) office [phone] but through the appointment prompt  Spoke with [name] who pleasantly updated RN CM that pt's pcp is on maternity leave and name is now Judson Roch ([place]) Sonia Baller. [facility] patients are seen by Gaynelle Arabian.Patient last seen by Gaynelle Arabian on 04/25/21 prior to her 12/7-9/22 (CVA) & 12/13-12/15/22 (LOC) hospitalizations & 06/01/21 ED visit for covid  [name] confirms the office had been attempting to outreach to patient phone number unsuccessfully to get her in the office for a surgical clearance for an upcoming colonoscopy  RN CM updated [name] of pt hospitalization for another CVA with increase speech difficulties and her daughter assisting with more tasks, [facility] referral and identified needs  [name] updated EPIC to make daughter Lakisha's phone number primary as [phone] and will call [name] to schedule an appointment for patient to have an office visit for surgical clearance and post hospital follow up with Cheron Schaumann  RN CM reviewed patient hospitalizations/ED visits, needs for post hospital follow up, cardiology, behavioral health/counselor for coping concerns Left RN CM office number   RN CM later noted an appointment was scheduled in EPIC for pt to see pcp staff [name], [place] on 06/23/20  RN CM sent notes and letters previously sent to [place] to [place] RN CM will follow up with patient within the next 30  business days   Anzel Kearse L. Lavina Hamman, RN, BSN, [place] Coordinator Office number [phone] Mobile number [phone]  Main [facility] number [phone] Fax number [phone]

## 2021-06-16 NOTE — Telephone Encounter (Signed)
Reports reviewed. No AF alerts noted. 3 tachy events in which daughter does not recall patient having any symptoms or complaints that day. Advised if she or patient has further questions or concerns please feel free to reach out. Appreciative of f/u call.

## 2021-06-16 NOTE — Telephone Encounter (Signed)
The patient transmission came through. 06-16-2021. The patient daughter would like for the nurse to give her a call to discuss the transmission.

## 2021-06-23 ENCOUNTER — Other Ambulatory Visit: Payer: Self-pay | Admitting: *Deleted

## 2021-06-23 NOTE — Patient Outreach (Addendum)
[name]) Care Management Telephonic RN Care Manager Note   06/23/2021 Name:  Courtney Burke MRN:  [id number] DOB:  Oct 12, 1955  Summary: [name] Unsuccessful outreach Outreach attempt to the listed at the preferred outreach number in [place] [phone] No answer. [facility] RN CM left HIPAA [facility] Portability and Accountability Act) compliant voicemail message along with CMs contact info.   Recommendations/Changes made from today's visit: none RN CM reviewed EPIC notes since last outreach on 06/16/21- no further 06/23/21 appointment with [name], PA. It indicates pt continues to attend her speech therapy and is progressing well. There is a 06/22/21 new order for cardiology referral with a 06/27/21 cardiology appointment with Dr [name] for atrial fibrillation  Subjective: Courtney Burke is an 66 y.o. year old female who is a primary patient of [name], PA-C. The care management team was consulted for assistance with care management and/or care coordination needs.    Telephonic RN Care Manager completed Telephone Visit today.   Objective:  Medications Reviewed Today     Reviewed by [name], RN (Registered Nurse) on 06/23/21 at [place] List Status: <None>   Medication Order Taking? Sig Documenting Provider Last Dose Status Informant  acetaminophen (TYLENOL) 500 MG tablet [id number] No Take 1,000 mg by mouth every 6 (six) hours as needed for moderate pain or headache. [provider] 05/31/2021 Active Child  atorvastatin (LIPITOR) 40 MG tablet [id number] No Take 1 tablet (40 mg total) by mouth at bedtime. [name], MD 05/31/2021 Expired 06/16/21 2359 Child  ELIQUIS 5 MG TABS tablet [id number] No TAKE 1 TABLET BY MOUTH TWICE A DAY  Patient taking differently: Take 5 mg by mouth 2 (two) times daily.   [name], MD 06/01/2021 Active Child  KLOR-CON M20 20 MEQ tablet [id number] No Take 20 mEq by mouth daily.  [provider] 06/01/2021 Active Child  levETIRAcetam (KEPPRA) 1000 MG tablet [id number] No Take 1 tablet (1,000 mg total) by mouth 2 (two) times daily. [name], MD 06/01/2021 Active Child  Multiple Vitamin (MULTIVITAMIN WITH MINERALS) TABS tablet [id number] No Take 1 tablet by mouth daily. [provider] 06/01/2021 Active Child  omeprazole (PRILOSEC) 40 MG capsule [id number] No Take 40 mg by mouth daily. [provider] 06/01/2021 Active Child  ondansetron (ZOFRAN-ODT) 4 MG disintegrating tablet [id number]  Take 1 tablet (4 mg total) by mouth every 8 (eight) hours as needed for up to 10 doses for nausea or vomiting. [name], MD  Active   Pseudoephedrine-APAP-DM (DAYQUIL PO) [id number] No Take 1 capsule by mouth daily as needed (coldy symptoms). [provider] 06/01/2021 Active Child  ursodiol (ACTIGALL) 250 MG tablet [id number] No Take 250 mg by mouth 2 (two) times daily. [provider] 06/01/2021 Active Child             SDOH:  (Social Determinants of Health) assessments and interventions performed:    Care Plan  Review of patient past medical history, allergies, medications, health status, including review of consultants reports, laboratory and other test data, was performed as part of comprehensive evaluation for care management services.   Care Plan : RN Care Manager Plan of Care  Updates made by [name], RN since 06/23/2021 12:00 AM     Problem: Complex Care Coordination Needs and disease management in patient with stroke   Priority: High  Onset Date: 05/24/2021     Long-Range Goal: Establish Plan of Care for Management Complex SDOH  Barriers, disease management and Care Coordination Needs in patient with stroke   Start Date: 05/24/2021  Recent Progress: On track  Priority: High  Note:   Current Barriers:  Knowledge Deficits related to plan of care for management of Stroke  Care Coordination needs related  to Limited education about stroke, coping with speech difficulties, follow up appointments, my chart access, cardiology/neurology providers* PMH 4 previous strokes Residual speech difficulties  Barriers: Health Behaviors Knowledge Other Coping, Changes in primary care office   RN CM Clinical Goal(s):  Patient/daughter will verbalize understanding of plan for management of stroke as evidenced by prevention of re admission in next 30 days  through collaboration with RN Care manager, provider, and care team.  Pt did return to ED on 06/01/21 Dx with covid- discharged home  Interventions: Outreaches to provide care coordination and disease management/education Inter-disciplinary care team collaboration (see longitudinal plan of care) Evaluation of current treatment plan related to  self management and patient's adherence to plan as established by provider 06/13/21 sent e-mail to daughter with list of in network united healthcare medicare providers as discussed - Sent/faxed letter to pcp with identified needs for pt 06/15/21 Outreaches to pcp/Loop monitor staff- messages left; step/step telephonic education to daughter to link my chart accounts and appointments- Reviewed upcoming speech and rehab appointments 06/16/21 collaboration with pcp office [name]). Clarified her pcp on maternity leave and pt last saw [name] PA and to see her again. Had her to updated contact number to now outreach daughter primarily, to schedule follow office visit for cardiac clearance for colonoscopy, post hospital follow up, referral to ([facility]) counselor for coping concerns, referral to cardiology prn- Left RN CM office number  sent notes and letters previously sent to [place] to [place] later noted an appointment was scheduled in [facility] for pt to see pcp staff [name], PA on 06/23/20 Re emailed a list of [place] providers to daughter while she was on the phone Connected daughter and loop monitor  staff [name] [phone] to review any possible noted abnormalities. [name] confirms pt loop recorder has not been remotely monitored since 02/2021 related to app error. Tech support info provided to daughter Dr Baird Kay is the Cardiology physiologist, no cardiology  06/23/21 Unsuccessful outreach, EPIC chart review, Speech sessions continue & doing well. No notes for a 06/23/21 pcp visit, pending cardiology visit with Dr [name] 06/27/21 for atrial fibrillation- Sent EMMI education via e-mail to daughter for vitamin K diet and anticoagulants for atrial fibrillation. Noted other EMMIs not viewed   Stroke:  (Status:Goal on track:  Yes.) Long Term Goal Reviewed Importance of taking all medications as prescribed Reviewed Importance of attending all scheduled provider appointments Advised to report any changes in symptoms or exercise tolerance Screening for signs and symptoms of depression related to chronic disease state Assessed social determinant of health barriers Reviewed referrals to outpatient therapy Assessed for fall status and safety in the home Reviewed the stroke history RN CM was able to find in EPIC with daughter CVAs in 1/22, 2/22, 12/22  Patient Goals/Self-Care Activities: Take all medications as prescribed Attend all scheduled provider appointments Attend church or other social activities Perform all self care activities independently  Call provider office for new concerns or questions  Attend speech therapy sessions as ordered  Follow Up Plan:  The patient has been provided with contact information for the care management team and has been advised to call with any health related  questions or concerns.  The care management team will reach out to the patient again over the next 30 business days.       Plan: The patient has been provided with contact information for the care management team and has been advised to call with any health related questions or concerns.  The care management  team will reach out to the patient again over the next 30+ business days.  Keoki Mchargue L. Lavina Hamman, RN, BSN, [place] Coordinator Office number [phone] Main [facility] number [phone] Fax number [phone]

## 2021-06-29 ENCOUNTER — Other Ambulatory Visit: Payer: Self-pay

## 2021-06-29 ENCOUNTER — Encounter: Payer: Self-pay | Admitting: Adult Health

## 2021-06-29 ENCOUNTER — Ambulatory Visit (INDEPENDENT_AMBULATORY_CARE_PROVIDER_SITE_OTHER): Payer: Medicare Other | Admitting: Adult Health

## 2021-06-29 VITALS — BP 132/82 | HR 56 | Ht 62.0 in | Wt 145.0 lb

## 2021-06-29 DIAGNOSIS — I69320 Aphasia following cerebral infarction: Secondary | ICD-10-CM

## 2021-06-29 DIAGNOSIS — Z8673 Personal history of transient ischemic attack (TIA), and cerebral infarction without residual deficits: Secondary | ICD-10-CM

## 2021-06-29 DIAGNOSIS — I63411 Cerebral infarction due to embolism of right middle cerebral artery: Secondary | ICD-10-CM

## 2021-06-29 DIAGNOSIS — I69398 Other sequelae of cerebral infarction: Secondary | ICD-10-CM | POA: Diagnosis not present

## 2021-06-29 DIAGNOSIS — R569 Unspecified convulsions: Secondary | ICD-10-CM

## 2021-06-29 MED ORDER — AMITRIPTYLINE HCL 25 MG PO TABS: 25.0000 mg | ORAL_TABLET | Freq: Every day | ORAL | 5 refills | Status: DC

## 2021-06-29 MED ORDER — ASPIRIN EC 81 MG PO TBEC: 81.0000 mg | DELAYED_RELEASE_TABLET | Freq: Every day | ORAL | Status: DC

## 2021-06-29 MED ORDER — LEVETIRACETAM 1000 MG PO TABS
1000.0000 mg | ORAL_TABLET | Freq: Two times a day (BID) | ORAL | 3 refills | Status: DC
Start: 2021-06-29 — End: 2021-07-13

## 2021-06-29 NOTE — Patient Instructions (Addendum)
Continue working with speech therapy for hopeful ongoing recovery  Continue Eliquis (apixaban) daily  and atorvastatin 40 mg daily and restart aspirin 81mg  daily for secondary stroke prevention  Continue Keppra 1000mg  twice daily for seizure prevention  Start amitriptyline 25mg  nightly for headaches, anxiety and difficulty sleeping  Continue to follow up with PCP regarding cholesterol and blood pressure management  Maintain strict control of hypertension with blood pressure goal below 130/90 and cholesterol with LDL cholesterol (bad cholesterol) goal below 70 mg/dL.   Signs of a Stroke? Follow the BEFAST method:  Balance Watch for a sudden loss of balance, trouble with coordination or vertigo Eyes Is there a sudden loss of vision in one or both eyes? Or double vision?  Face: Ask the person to smile. Does one side of the face droop or is it numb?  Arms: Ask the person to raise both arms. Does one arm drift downward? Is there weakness or numbness of a leg? Speech: Ask the person to repeat a simple phrase. Does the speech sound slurred/strange? Is the person confused ? Time: If you observe any of these signs, call 911.      Followup in the future with me in 4 months or call earlier if needed     Thank you for coming to see [place] at [facility]. I hope we have been able to provide you high quality care today.  You may receive a patient satisfaction survey over the next few weeks. We would appreciate your feedback and comments so that we may continue to improve ourselves and the health of our patients.

## 2021-06-29 NOTE — Progress Notes (Signed)
[facility] [address] [place]. [place] [address] [phone]       OFFICE FOLLOW-UP NOTE  [name] Date of Birth:  [birth year] Medical Record Number:  [id number]    Reason for visit: Stroke follow-up Primary neurologist: Dr. 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 fib after presenting with headache, worsening speech and confusion. MRA right ICA 50% stenosis and severe proximal right M2 stenosis, L M1 stent patent, no LVO.  On Eliquis twice daily and Brilinta PTA recommended continuation as well as adding aspirin 81 mg daily.  LDL 21 on atorvastatin.  A1c 5.5.  EEG generalized slowing but no seizures - remained on keppra. Discharged 12/7. Returned on 12/12 after episode of vomiting and syncopal episode with MRI showing extension of recent R MCA stroke likely in setting of hypotension/syncope in setting of right ICA and M2 stenosis. Continued Eliquis and asa -Brilinta discontinued.  Concern of possible seizure on 12/14 with staring off and not responsive, confusion for a few minutes after therefore increased Keppra to 1g BID. LDL 21 - decreased atorvastatin to 40 mg daily. BP stabilized on IVF, orthostatics unremarkable.  Residual expressive aphasia with word salad. Therapy evals recommended OP therapies.    Today she is accompanied by her daughter, Courtney Burke. Having headaches since recent stroke. Will take tylenol with some benefit but will return. Continues to work with SLP noting improvement, family working with her on reading out loud and conversations. Per daughter, patient will push herself too much at times and try to do too much at once. Family tries to ensure she  takes breaks. Denies weakness, some imbalance but overall stable. Does c/o fear of additional strokes and difficulty sleeping at night due to mind wandering. No prior issues with depression/anxiety.   Reports compliance on Eliquis 5 mg twice daily and atorvastatin 40 mg daily without side effects. Has not been taking aspirin.  Blood pressure today 151/78 and on recheck, 132/82.  Compliant on Keppra but over past week has been taking 500mg  BID due to difficulty refilling new dose (had 500mg  tabs left over from prior prescription). Was doing well on 1000mg  BID.   Of note, diagnosed with COVID-19 12/29 after presenting to ED with lightheadedness, dizziness, nausea and vomiting and possible loss of consciousness or near syncope.  Extensive work-up largely unrevealing except COVID-positive which was felt to be contributing to her symptoms. MR brain and MRA head/neck no acute findings or changes from prior recent imaging.   No further concerns at this time     PERTINENT IMAGING  Per hospitalization 05/15/2021 CT head - Slightly increased hypodensity in the right posterior temporal lobe and lateral occipital lobe, consistent with evolution of previously noted acute infarct. MRI  Extension of the acute infarction in the right MCA territory at the right temporoparietal junction, particularly along the inferior margin of the infarction and with a few tiny punctate satellite foci additionally present. MRA 12/7 - Approximate 50% stenosis at the supraclinoid right ICA, with probable additional short-segment severe proximal right M2 stenosis.  EEG cortical dysfunction arising from left and right temporal region, no seizure   Per hospitalization  05/10/2021 CT head Old left MCA territory strokes. Newly seen loss of gray-white differentiation at the right temporoparietal junction suggesting acute infarction in that region today. No hemorrhage or mass effect MRI  1. Acute ischemic nonhemorrhagic infarct involving  the right temporoccipital junction, posterior right MCA distribution.2. Chronic left MCA territory infarcts, stable from prior.3. Additional small remote right cerebellar infarct.4. Underlying age-related cerebral atrophy with mild chronic small vessel ischemic disease. MRA  head1. Negative intracranial MRA for large vessel occlusion.2. Approximate 50% stenosis at the supraclinoid right ICA, with probable additional short-segment severe proximal right M2 stenosis.3. Vascular stent in place within the left M1 segment. Flow through the stent itself not evaluated by MRA, although patent flow seen distal to the stent.4. Short-segment severe left M2 stenosis, similar to previous.5. Wide patency of the posterior circulation MRA Neck Wide patency of both carotid artery systems and vertebral arteries within the neck. No hemodynamically significant stenosis or other acute vascular abnormality. 2D Echo EF 55 to 60% EEG generalized slowing.  No seizures identified       History provided for reference purposes only Update 02/13/2021 JM: Courtney Burke returns for acute visit in setting of recent seizure activity.  She is accompanied by her brother.  Seizure type event 02/07/2021 evaluated in ED with symptoms of headache and then tonic-clonic jerking lasting 5 to 6 minutes with residual confusion and bowel incontinence.  Initiated Keppra 500 mg twice daily. CT head unremarkable.  No additional events since discharge.  Remains on Keppra 500 mg twice daily tolerating without side effects.  Reports slight worsening of speech since seizure event.  Was working with SLP and PT prior to seizure but has not yet restarted.  Continues to use a cane and denies any recent falls.  Denies new stroke/[name] symptoms.  Remains on Eliquis and atorvastatin.  Also compliant on Brilinta per IR recommendations.  No further concerns at this time.  Update 01/04/2021 JM: Courtney Burke returns for 47-month stroke follow-up accompanied by her brother,  Courtney Burke.  Working with SLP for residual aphasia but notes ongoing improvement. Gait has also been improving working with PT - uses cane - did have a fall last month but thankfully without injury.  Denies new stroke/[name] symptoms. ILR showed evidence of atrial fibrillation on 10/05/2020 -Eliquis initiated in addition to [place]. ASA d/c'd.  She does have mild bruising but otherwise tolerating.  Has not yet scheduled diagnostic angiogram with Dr. Estanislado Pandy.  Compliant on atorvastatin without associated side effects.  Blood pressure 136/82. Monitors at home and typically stable 120s/70s.  No further concerns at this time.  Initial visit 10/05/2020 Dr. Leonie Burke: Ms. Murley is a 66 year old African-American lady seen today for initial office follow-up visit following hospital admission for stroke in January 2022.  She is accompanied by her daughter.  History is obtained from them and review of hospital electronic medical records and I personally reviewed imaging films in PACS.  She is a 66 year old pleasant African-American lady with past medical history of diabetes, hypertension, chronic kidney disease stage IIIa, dysphagia, obesity, s/p gastric sleeve surgery in December 2021.  She was brought in as a code stroke by EMS after she developed altered mental status.  She had a witnessed episodes in which her eyes rolled back in her head twice with brief loss of consciousness.  She is subsequently found to have right-sided weakness.  She was seen back the code stroke team upon arrival and NIH stroke scale was found to be 21 and emergent CT scan of  the head was obtained which was unremarkable but CT angiogram showed an acute left middle cerebral artery occlusion in the M1 segment with CT perfusion scan showing a favorable penumbra.  Aspect score was 6 on admission.  Infarct core was 11 to 20 cc only.  After discussion of risk benefits with the patient's family patient was taken for emergent mechanical thrombectomy which was performed  by Dr. Estanislado Pandy but there was underlying MCA stenosis requiring rescue MCA stenting with resultant TICI 2C revascularization.  She was admitted admitted to the intensive care unit blood pressure tightly controlled.  She was subsequently extubated and found to have global aphasia and right hemiparesis.  MRI scan showed acute infarct involving left MCA territory of basal ganglia as well as left patchy temporal frontal and parietal lobes.  There is small area of hemorrhage noted in the left posterior temporal lobe.  2D echo showed ejection fraction of 6065%.  Lower extremity venous Dopplers negative for DVT.  LDL cholesterol 68 mg percent.  Hemoglobin A1c was 5.4.  Patient was started on aspirin and Brilinta for MCA stent.  History of dysphagia and had a feeding tube inserted.  She was transferred to inpatient rehab where she stayed for few weeks.  She also started on Keppra for seizure prophylaxis due to her episodes of seizure-like presentation.  EEG she had shown intermittent rhythmic delta slowing in the left frontal region.  Patient has been at home since rehab.  She is to get getting physical occupational and speech therapy which is now down to 1 day a week.  She is able to speak better but still has to speak slowly and occasionally gets stuck and then some word finding difficulties.  She has noticed improvement in right-sided strength and now she can walk independently but needs a walker.  She still has some dragging of the right foot.  Her balance is poor.  However she has had no falls or injuries.  She is able to manage most activities of daily living but needs a little supervision.  She is tolerating aspirin and Brilinta well with only minor bruising and no bleeding.  Blood pressures well controlled today it is elevated in the office at 153/75.  She remains on Lipitor which is tolerating well without muscle aches and pains.    ROS:   14 system review of systems is positive for those listed in HPI and  all other systems negative  PMH:  Past Medical History:  Diagnosis Date   Acute ischemic left MCA stroke ([facility]) 06/30/2020   Acute ischemic stroke (HCC)    Acute stroke due to occlusion of left middle cerebral artery ([place]) 06/30/2020   Age-related nuclear cataract of both eyes 09/28/2015   Arthralgia of multiple joints 09/28/2015   Asthma    Borderline diabetes    Cerebrovascular accident (CVA) ([facility])    Chronic bilateral thoracic back pain 06/05/2018   Chronic renal insufficiency, stage 1 08/11/2014   Diabetes mellitus type 2 in obese ([place])    Dyslipidemia    Dysphagia, post-stroke    Erosive gastritis 07/20/2019   Formatting of this note might be different from the original. On EGD 07/2019   Esophagitis 07/20/2019   Formatting of this note might be different from the original. Added automatically from request for surgery [id number]  Formatting of this note might be different from the original. On EGD 07/2019   Essential hypertension 09/28/2015   Essential thrombocytosis ([place]) 09/28/2015   Gallstones 08/18/2019   Formatting of this  note might be different from the original. Added automatically from request for surgery [id number]   Gastroesophageal reflux disease without esophagitis 09/28/2015   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   Hyperlipidemia, unspecified 03/16/2013   Hypertension    Keratoconjunctivitis sicca of both eyes not specified as Sjogren's 09/28/2015   Left middle cerebral artery stroke ([place]) 07/09/2020   Morbid obesity ([place] [place]) 05/21/2013   Seizures ([place])    Stage 3b chronic kidney disease ([place])    Status post gastric bypass for obesity 09/28/2015    Social History:  Social History   Socioeconomic History   Marital status: Divorced    Spouse name: Not on file   Number of children: Not on file   Years of education: Not on file   Highest education level: Not on file  Occupational History   Not on file  Tobacco Use   Smoking status: Never   Smokeless tobacco:  Never  Vaping Use   Vaping Use: Never used  Substance and Sexual Activity   Alcohol use: No   Drug use: No   Sexual activity: Not on file  Other Topics Concern   Not on file  Social History Narrative   Lives with daughter Courtney Burke   Right [place] no caffeine   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[place] Strain: Low Risk    Difficulty of Paying Living Expenses: Not hard at all  Food Insecurity: No Food Insecurity   Worried About Charity fundraiser in the Last Year: Never true   Arboriculturist in the Last Year: Never true  Transportation Needs: No Transportation Needs   Lack of Transportation (Medical): No   Lack of Transportation (Non-Medical): No  Physical Activity: Not on file  Stress: No Stress Concern Present   Feeling of Stress : Only a little  Social Connections: Moderately Integrated   Frequency of Communication with Friends and Family: More than three times a week   Frequency of Social Gatherings with Friends and Family: More than three times a week   Attends Religious Services: 1 to 4 times per year   Active Member of [employer] or Organizations: Yes   Attends Archivist Meetings: 1 to 4 times per year   Marital Status: Divorced  Human resources officer Violence: Not At Risk   Fear of Current or Ex-Partner: No   Emotionally Abused: No   Physically Abused: No   Sexually Abused: No    Medications:   Current Outpatient Medications on File Prior to Visit  Medication Sig Dispense Refill   acetaminophen (TYLENOL) 500 MG tablet Take 1,000 mg by mouth every 6 (six) hours as needed for moderate pain or headache.     atorvastatin (LIPITOR) 40 MG tablet Take 1 tablet (40 mg total) by mouth at bedtime. 30 tablet 0   ELIQUIS 5 MG TABS tablet TAKE 1 TABLET BY MOUTH TWICE A DAY (Patient taking differently: Take 5 mg by mouth 2 (two) times daily.) 60 tablet 3   KLOR-CON M20  20 MEQ tablet Take 20 mEq by mouth daily.     levETIRAcetam (KEPPRA) 1000 MG tablet Take 1 tablet (1,000 mg total) by mouth 2 (two) times daily. 60 tablet 1   Multiple Vitamin (MULTIVITAMIN WITH MINERALS) TABS tablet Take 1 tablet by mouth daily.     omeprazole (PRILOSEC) 40 MG capsule Take 40 mg by  mouth daily.     ondansetron (ZOFRAN-ODT) 4 MG disintegrating tablet Take 1 tablet (4 mg total) by mouth every 8 (eight) hours as needed for up to 10 doses for nausea or vomiting. 20 tablet 0   Pseudoephedrine-APAP-DM (DAYQUIL PO) Take 1 capsule by mouth daily as needed (coldy symptoms).     ursodiol (ACTIGALL) 250 MG tablet Take 250 mg by mouth 2 (two) times daily.     No current facility-administered medications on file prior to visit.    Allergies:   Allergies  Allergen Reactions   Homatropine Itching   Iodinated Contrast Media Itching   Doxycycline Nausea And Vomiting   Sulfa Antibiotics Rash and Hives   Codeine Hives and Swelling    Swollen tongue   Hydrocodone Bit-Homatrop Mbr Itching   Ace Inhibitors Cough, Itching and Rash    Physical Exam Today's Vitals   06/29/21 1034  BP: (!) 151/78  Pulse: (!) 56  Weight: 145 lb (65.8 kg)  Height: 5\' 2"  (1.575 m)    Body mass index is 26.52 kg/m.   General: well developed, well nourished very pleasant middle-aged African-American lady, seated, in no evident distress Head: head normocephalic and atraumatic.  Neck: supple with no carotid or supraclavicular bruits Cardiovascular: regular rate and rhythm, no murmurs Musculoskeletal: no deformity Skin:  no rash/petichiae Vascular:  Normal pulses all extremities  Neurologic Exam Mental Status: Awake and fully alert.  Mild to moderate expressive aphasia.  Able to follow simple step commands without difficulty. Some difficulty understanding more complex sentences.  Oriented to place and person.  Recent memory impaired and remote memory intact. Attention span, concentration and fund of  knowledge appropriate during visit. Mood and affect appropriate.   Cranial Nerves: Pupils equal, briskly reactive to light. Extraocular movements full without nystagmus. Visual fields full to confrontation. Hearing intact. Facial sensation intact.  Face, tongue, palate moves normally and symmetrically.  Motor: Normal bulk and tone. Normal strength in all tested extremity muscles.  Orbits left over right upper extremity.   Sensory.: intact to touch ,pinprick .position and vibratory sensation.  Coordination: Rapid alternating movements normal in all extremities. Finger-to-nose and heel-to-shin performed accurately bilaterally. Gait and Station: Arises from chair without difficulty. Stance is normal. Gait demonstrates normal stride length with mild imbalance initially but stabilized without assistive device.   Reflexes: 1+ and symmetric. Toes downgoing.       ASSESSMENT/PLAN: 66 year old lady with left MCA infarct due to left MCA occlusion s/p rescue stenting 06/2020 s/p ILR and seizures placed on Keppra. ILR showed A fib 10/2020 - placed on Eliquis. Additional seizure 02/2021, restarted Keppra. R MCA temporal occipital junction infarct 05/2021 likely from A. fib with extension 05/15/2021 and seizure activity - increased Keppra dosage. Vascular risk factors of HLD, atrial fibrillation, HTN, and intracranial stenosis.      1.  Seizure activity likely late effect of stroke -Continue Keppra 1000 mg twice daily for seizure prevention -refill provided -no additional seizures since 05/2021 -Discussed seizure provoking triggers and importance of avoidance  2.  Right MCA stroke 2. Hx of L MCA stroke -Residual aphasia -continue working with SLP thru [place] in [facility] -start amitriptyline 25 mg nightly for headaches, anxiety and insomnia since stroke.  Discussed potential side effects and to let me know after 1 week for potential need of dosage increase -continue Eliquis 5 mg twice daily and  atorvastatin 40 mg daily and restart asa 81mg  daily (as advised at hospital discharge) for secondary stroke prevention measures  -  s/p MCA stent -recent MRA showed patent stent. Followed by IR -maintain strict control of hypertension with blood pressure goal below 130/90 and lipids with LDL cholesterol goal below 70 mg/dL.  -Stroke labs 05/11/2021 - LDL 21, A1c 5.5    Follow-up in 4 months or call earlier if needed   CC:  [facility] provider: Dr. Marcene Corning, Moss Mc, PA-C   I spent 54 minutes of face-to-face and non-face-to-face time with patient and daughter.  This included previsit chart review, lab review, study review, order entry, electronic health record documentation, patient and daughter education and discussion regarding recent stroke and hx of prior stroke with residual deficits and etiology, secondary stroke prevention measures and aggressive stroke risk factor management, seizures and medication use and answered all other questions to patient and daughters satisfaction  Frann Rider, [facility]  [facility] [address] [place], [place] [address]  Phone [phone] Fax [phone] Note: This document was prepared with digital dictation and possible smart phrase technology. Any transcriptional errors that result from this process are unintentional.

## 2021-06-30 ENCOUNTER — Other Ambulatory Visit: Payer: Self-pay

## 2021-06-30 ENCOUNTER — Other Ambulatory Visit: Payer: Self-pay | Admitting: *Deleted

## 2021-06-30 NOTE — Patient Outreach (Addendum)
Courtney Burke) Care Management Telephonic RN Care Manager Note   06/30/2021 Name:  Courtney Burke MRN:  [id number] DOB:  03-05-1956  Summary: Follow up outreach after 06/29/21 neurology visit  Daughter Courtney Burke discussed the missed 06/23/21 [facility] outreach and change in pcp visit with Dr Thayer Jew related to a funeral  Courtney Burke confirms the primary care provider (PCP) offered a referral for cardiology There is questionably another 07/17/21 appointment other than the noted loop monitor check but Courtney Burke can not recall what it is for or who it is with. RN CM is unable to find information RN CM reminded her that there should also be another reminder call or message in Courtney Burke soon She received messages and has to return calls to pcp soon- Barrier: wait time to reach staff  06/29/21 Neurology visit  Courtney Burke voiced her and Courtney Burke's appreciation for the neurology visit with Courtney Burke and time spent addressing patient's needs- Courtney Burke voices her and the patient's preference to remain with [facility] neurology (does not want to change to any other neurological services)   Courtney Burke was able to review the changed and added medications (Keppra, amitriptyline, Eliquis, ASA) Courtney Burke states she appreciated the detailed discussion of the pt's stroke history and being made aware that the second stroke was related to atrial fibrillation Courtney Burke stated she was made aware that Courtney Burke has been having headaches but had not been sharing this symptom. Now that she is aware she will be able to monitor for headaches more. Pt prescribed medicines for her headaches, anxiety and her decreased sleep  Courtney Burke states Courtney Burke slept "like a baby" on the night of 06/29/21 and had the best night's worth of sleep she had in months  Speech Improving- continues to attend therapy NO longer missing speech sessions Also using papers/cards with pictures for difficult words Courtney Burke is praying and reading the Bible with Courtney  Burke to increase her faith and to assist with speech   Atrial fibrillation The loop recorder that was questionably last properly checked in November 2022 is now properly intact/set up and properly being monitored   Still pending a GI appointment    Recommendations/Changes made from today's visit: See care plan's interventions section   Subjective: Courtney Burke is an 66 y.o. year old female who is a primary patient of Courtney Octave, PA-C. The care management team was consulted for assistance with care management and/or care coordination needs.    Telephonic RN Care Manager completed Telephone Visit today.   Objective:  Medications Reviewed Today     Reviewed by Frann Rider, NP (Nurse Practitioner) on 06/29/21 at 1146  Med List Status: <None>   Medication Order Taking? Sig Documenting Provider Last Dose Status Informant  acetaminophen (TYLENOL) 500 MG tablet [id number] Yes Take 1,000 mg by mouth every 6 (six) hours as needed for moderate pain or headache. [provider] Taking Active Child  amitriptyline (ELAVIL) 25 MG tablet [id number] Yes Take 1 tablet (25 mg total) by mouth at bedtime. Frann Rider, NP  Active   aspirin EC 81 MG tablet [id number] Yes Take 1 tablet (81 mg total) by mouth daily. Swallow whole. Frann Rider, NP  Active   atorvastatin (LIPITOR) 40 MG tablet [id number] Yes Take 1 tablet (40 mg total) by mouth at bedtime. [name], MD Taking Active Child  ELIQUIS 5 MG TABS tablet [id number] Yes TAKE 1 TABLET BY MOUTH TWICE A DAY  Patient taking differently: Take 5 mg by mouth 2 (two) times daily.  [name], MD Taking Active Child  KLOR-CON M20 20 MEQ tablet [id number] Yes Take 20 mEq by mouth daily. [provider] Taking Active Child  levETIRAcetam (KEPPRA) 1000 MG tablet [id number]  Take 1 tablet (1,000 mg total) by mouth 2 (two) times daily. Frann Rider, NP  Active   Multiple Vitamin (MULTIVITAMIN WITH  MINERALS) TABS tablet [id number] Yes Take 1 tablet by mouth daily. [provider] Taking Active Child  omeprazole (PRILOSEC) 40 MG capsule [id number] Yes Take 40 mg by mouth daily. [provider] Taking Active Child  ondansetron (ZOFRAN-ODT) 4 MG disintegrating tablet [id number] Yes Take 1 tablet (4 mg total) by mouth every 8 (eight) hours as needed for up to 10 doses for nausea or vomiting. [name], MD Taking Active   Pseudoephedrine-APAP-DM (DAYQUIL PO) [id number] Yes Take 1 capsule by mouth daily as needed (coldy symptoms). [provider] Taking Active Child  ursodiol (ACTIGALL) 250 MG tablet [id number] Yes Take 250 mg by mouth 2 (two) times daily. [provider] Taking Active Child           Patient Active Problem List   Diagnosis Date Noted   Syncope 05/16/2021   Breast lump 05/16/2021   Seizure ([place]) 05/16/2021   CVA (cerebral vascular accident) (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 ([place]) 06/30/2020   Gallstones 08/18/2019   Esophagitis 07/20/2019   Erosive gastritis 07/20/2019   Nausea vomiting and diarrhea 07/16/2019   Chronic bilateral thoracic back pain 06/05/2018   Large breasts 06/05/2018   Seasonal allergic rhinitis due to pollen 05/09/2017   Microcalcifications of the breast 07/02/2016   Mild intermittent asthma without complication 07/62/2633   Severe single current episode of major depressive disorder, without psychotic features ([place]) 05/08/2016   Vitamin D deficiency 05/08/2016   Encounter for general adult medical examination without abnormal findings 05/08/2016   Persistent headaches 02/16/2016   Age-related nuclear cataract of both eyes 09/28/2015   Asthma 09/28/2015   Essential hypertension  09/28/2015   Essential thrombocytosis ([place]) 09/28/2015   Gastroesophageal reflux disease without esophagitis 09/28/2015   Keratoconjunctivitis sicca of both eyes not specified as Sjogren's 09/28/2015   Status post gastric bypass for obesity 09/28/2015   Stage 3a chronic kidney disease ([place]) 09/28/2015   Hot flashes 09/28/2015   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   IFG (impaired fasting glucose) 09/28/2015   Menopausal disorder 09/28/2015   Obesity (BMI 30-39.9) 09/28/2015   Non-smoker 09/28/2015   Pelvic pain in female 09/28/2015   Presbyopia of both eyes 09/28/2015   Snoring 09/28/2015   Tinea corporis 09/28/2015   Osteopenia 07/15/2015   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   Morbid obesity ([place]) 05/21/2013   Hyperlipidemia, unspecified 03/16/2013     SDOH:  (Social Determinants of Health) assessments and interventions performed:  SDOH Interventions    Flowsheet Row Most Recent Value  SDOH Interventions   Food Insecurity Interventions Intervention Not Indicated  Housing Interventions Intervention Not Indicated  Intimate Partner Violence Interventions Intervention Not Indicated  Transportation Interventions Intervention Not Indicated       Care Plan  Review of patient past medical history, allergies, medications, health status, including review of consultants reports, laboratory and other test data, was performed as part of comprehensive evaluation for care management services.   Care Plan :  RN Care Manager Plan of Care  Updates made by [name], RN since 06/30/2021 12:00 AM     Problem: Complex Care Coordination Needs and disease management in patient with stroke   Priority: High  Onset Date: 05/24/2021     Long-Range Goal: Establish Plan of Care for Management Complex SDOH Barriers, disease management and Care Coordination Needs in patient with stroke   Start Date: 05/24/2021  This Visit's Progress: On track  Recent Progress: On track  Priority:  High  Note:   Current Barriers:  Knowledge Deficits related to plan of care for management of Stroke  Care Coordination needs related to Limited education about stroke, coping with speech difficulties, follow up appointments, my chart access, cardiology/neurology providers* PMH 4 previous strokes Residual speech difficulties  Barriers: Health Behaviors Knowledge Other Coping, Changes in primary care office staff, concerns with outreaches to pcp office (wait time/response)   RN CM Clinical Goal(s):  Patient/daughter will verbalize understanding of plan for management of stroke as evidenced by prevention of re admission in next 30 days  through collaboration with RN Care manager, provider, and care team.  Pt did return to ED on 06/01/21 Dx with covid- discharged home 06/30/21 Goals MET- coordination of care with covering pcp, cardiology referral, loop recorder that was questionably last properly checked in November 2022 is now properly intact/set up and properly being monitored, better attendance to speech therapy/no longer missing sessions, my chart usage continues, Received medicines to assist with anxiety & sleep, improved sleep reported -sent list of [facility] providers as requested  Interventions: Outreaches to provide care coordination and disease management/education Inter-disciplinary care team collaboration (see longitudinal plan of care) Evaluation of current treatment plan related to  self management and patient's adherence to plan as established by provider 06/13/21 sent e-mail to daughter with list of in network united healthcare medicare providers as discussed - Sent/faxed letter to pcp with identified needs for pt 06/15/21 Outreaches to pcp/Loop monitor staff- messages left; step/step telephonic education to daughter to link my chart accounts and appointments- Reviewed upcoming speech and rehab appointments 06/16/21 collaboration with pcp office [name]). Clarified her pcp on maternity  leave and pt last saw [name] PA and to see her again. Had her to updated contact number to now outreach daughter primarily, to schedule follow office visit for cardiac clearance for colonoscopy, post hospital follow up, referral to ([facility]) counselor for coping concerns, referral to cardiology prn- Left RN CM office number  sent notes and letters previously sent to [place] to [place] later noted an appointment was scheduled in [facility] for pt to see pcp staff [name], PA on 06/23/20 Re emailed a list of [place] providers to daughter while she was on the phone Connected daughter and loop monitor staff [name] [phone] to review any possible noted abnormalities. [name] confirms pt loop recorder has not been remotely monitored since 02/2021 related to app error. Tech support info provided to daughter Dr Baird Kay is the Cardiology physiologist, no cardiology  06/23/21 Unsuccessful outreach, EPIC chart review, Speech sessions continue & doing well. No notes for a 06/23/21 pcp visit, pending cardiology visit with Dr [name] 06/27/21 for atrial fibrillation- Sent EMMI education via e-mail to daughter for vitamin K diet and anticoagulants for atrial fibrillation. Noted other EMMIs not viewed 06/30/21 Suggested [name] using mychart to better communicate with all providers for a more possible prompt response Discussed how atrial fibrillation can cause possible  clots that can lead to stroke, stroke & seizure prevention Encouraged more use picture cards with patient to assist with speech Dicussed Tarren Sabree was listed as a behavioral health provider seen previously by pt. Encouraged daughter to inquire if pt had interest in re establishing services with this provider or any other counselor Commend Courtney Burke for encouraging her mother's faith   Stroke:  (Status:Goal on track:  Yes.) Long Term Goal 06/30/21 continues with headaches, poor sleep, speech difficulties -amitriptyline started ASA  restarted Reviewed Importance of taking all medications as prescribed Reviewed Importance of attending all scheduled provider appointments Advised to report any changes in symptoms or exercise tolerance Screening for signs and symptoms of depression related to chronic disease state Assessed social determinant of health barriers Reviewed referrals to outpatient therapy Assessed for fall status and safety in the home Reviewed the stroke history RN CM was able to find in EPIC with daughter CVAs in 1/22, 2/22, 12/22  Patient Goals/Self-Care Activities: Take all medications as prescribed Attend all scheduled provider appointments Attend church or other social activities Perform all self care activities independently  Call provider office for new concerns or questions  Attend speech therapy sessions as ordered  Follow Up Plan:  The patient has been provided with contact information for the care management team and has been advised to call with any health related questions or concerns.  The care management team will reach out to the patient again over the next 30+ business days.       Plan: The patient has been provided with contact information for the care management team and has been advised to call with any health related questions or concerns.  The care management team will reach out to the patient again over the next 30+ business days.  Vrishank Moster L. Lavina Hamman, RN, BSN, [place] Coordinator Office number [phone] [facility] number [phone] Fax number [phone]

## 2021-07-05 NOTE — Progress Notes (Signed)
I agree with the above plan 

## 2021-07-09 ENCOUNTER — Other Ambulatory Visit (HOSPITAL_COMMUNITY): Payer: Self-pay | Admitting: Cardiology

## 2021-07-10 NOTE — Telephone Encounter (Signed)
Pt last saw Roderic Palau, NP on 10/20/20, last labs 05/22/21 Creat 1.44, age 66, weight 65.8kg, based on specified criteria pt is on appropriate dosage of Eliquis 5mg  BID for afib.  Will refill rx.

## 2021-07-11 ENCOUNTER — Telehealth: Payer: Self-pay | Admitting: Adult Health

## 2021-07-11 NOTE — Telephone Encounter (Signed)
Larene [place] from [facility] called Maricela Bo., NP back due to call being disconnected on transfer. Please call back at [phone] Ext. [phone]

## 2021-07-11 NOTE — Telephone Encounter (Signed)
I called Courtney Burke back and left a vm asking for call back/clarification. Not sure if the message is asking for clearance for up coming GI procedure?

## 2021-07-11 NOTE — Telephone Encounter (Signed)
[facility] Courtney Burke) Courtney Burke concern with stopping blood thinner, apixaban (ELIQUIS) 5 MG TABS tablet due to patient has had a stroke. Would neurologist like for patient to have a bridge? Please have neurologist reach out to GI Dr. Dorrene German with any recommendation.

## 2021-07-12 NOTE — Telephone Encounter (Signed)
Called [name], NP, phone continued to ring with no answer. Called Dr Dinah Beers office, spoke with [name] and informed her per [name], NP  : Patient is on Eliquis for A fib which is managed by cardiology. Bridge would have to be advised by cardiology . Courtney Burke verbalized understanding, appreciation.

## 2021-07-12 NOTE — Telephone Encounter (Signed)
Patient is on Eliquis for A fib which is managed by cardiology. Bridge would have to be advised by cardiology .

## 2021-07-14 ENCOUNTER — Other Ambulatory Visit: Payer: Self-pay

## 2021-07-14 ENCOUNTER — Encounter: Payer: Self-pay | Admitting: Physical Medicine & Rehabilitation

## 2021-07-14 ENCOUNTER — Encounter: Payer: Medicare Other | Attending: Physical Medicine & Rehabilitation | Admitting: Physical Medicine & Rehabilitation

## 2021-07-14 VITALS — BP 123/75 | HR 78 | Temp 97.8°F | Ht 62.0 in | Wt 147.2 lb

## 2021-07-14 DIAGNOSIS — I69320 Aphasia following cerebral infarction: Secondary | ICD-10-CM | POA: Diagnosis not present

## 2021-07-14 DIAGNOSIS — I69398 Other sequelae of cerebral infarction: Secondary | ICD-10-CM | POA: Diagnosis not present

## 2021-07-14 DIAGNOSIS — R269 Unspecified abnormalities of gait and mobility: Secondary | ICD-10-CM

## 2021-07-14 NOTE — Patient Instructions (Signed)
Follow up with Neurology for post stroke seizures , Headache and stroke prevention

## 2021-07-14 NOTE — Progress Notes (Signed)
Subjective:    Patient ID: [name], female    DOB: [birth year], 66 y.o.   MRN: [id number] right-handed female with history of hypertension, diet-controlled diabetes mellitus, CKD stage III, morbid obesity gastric sleeve 05/24/2020.  Presented 06/30/2020 with altered mental status and aphasia.  Cranial CT scan showed acute left MCA territory infarction.  No hemorrhage or mass-effect.  Patient did not receive TPA.  EEG negative for seizures.  CT angiogram of head and neck positive for emergent large vessel occlusion.  Left MCA origin.  Some left MCA branch reconstitution underwent MCA stenting per interventional radiology.  Follow-up MRI showed acute infarct left MCA territory involving the basal ganglia as well as left temporal frontal parietal lobes.  Small amount of hemorrhage in the left posterior temporal lobe.  Additional small areas of acute infarction in the occipital white matter bilaterally and right cerebellum.  Echocardiogram with ejection fraction of 60 to 65%.  No wall motion abnormalities grade 1 diastolic dysfunction.  Maintained on aspirin 81 mg daily as well as Brilinta for CVA prophylaxis.  Patient follow-up outpatient cardiology service to discuss loop recorder placement.  Subcutaneous Lovenox for DVT prophylaxis.  Maintained on Keppra for seizure prophylaxis.  Hospital course complicated by post stroke dysphagia currently on a dysphagia #2 nectar thick liquid diet with alternative means of nutritional support.  Bouts of urinary retention placed on Urecholine.  Due to patient decreased functional ability and global aphasia was admitted for a comprehensive rehab program  Admit date: 07/09/2020 Discharge date: 07/26/2020 HPI New RIght temporo-occipital  CVA Stent placed by interventional radiology. Did not require additional inpatient rehab hospitalization.  According to daughter patient appears to be back at her baseline perhaps a little unsteady but no falls.  Interval history  hospitalized for CVA on 05/10/2021 until 05/12/2021, readmitted 05/15/2021 until 05/18/2021 for stroke extension as well as post stroke seizure And finally ED visit on 712/29/2022 diagnosed with COVID IMPRESSION: 1. No acute infarct. 2. Evolving subacute bilateral temporoparietal and posterior left frontal infarcts. 3. Chronic small vessel ischemia and old infarcts as above. 4. Unchanged intracranial atherosclerosis including severe stenoses of the right MCA bifurcation, proximal right ACA, and distal left M2 segment. 5. Wide patency of the carotid arteries and vertebral arteries.     Electronically Signed   By: Logan Bores M.D.   On: 06/01/2021 16:33 Pain Inventory Average Pain 0 Pain Right Now 0 My pain is  no pain  BOWEL Number of stools per week: 7-10 loose at times  BLADDER Normal    Mobility walk without assistance ability to climb steps?  yes do you drive?  no  Function retired  Neuro/Psych bowel control problems depression  Prior Studies Any changes since last visit?  yes  was readmitted in December with second stroke and seizure  Physicians involved in your care Any changes since last visit?  no   Family History  Problem Relation Age of Onset   Stroke Maternal Grandfather    Social History   Socioeconomic History   Marital status: Divorced    Spouse name: Not on file   Number of children: Not on file   Years of education: Not on file   Highest education level: Not on file  Occupational History   Not on file  Tobacco Use   Smoking status: Never   Smokeless tobacco: Never  Vaping Use   Vaping Use: Never used  Substance and Sexual Activity   Alcohol use: No   Drug use: No  Sexual activity: Not on file  Other Topics Concern   Not on file  Social History Narrative   Lives with daughter [name]   Right [place] no caffeine   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[place] Strain: Low Risk    Difficulty of Paying Living Expenses: Not hard at all  Food Insecurity: No Food Insecurity   Worried About Charity fundraiser in the Last Year: Never true   Arboriculturist in the Last Year: Never true  Transportation Needs: No Transportation Needs   Lack of Transportation (Medical): No   Lack of Transportation (Non-Medical): No  Physical Activity: Not on file  Stress: No Stress Concern Present   Feeling of Stress : Only a little  Social Connections: Moderately Integrated   Frequency of Communication with Friends and Family: More than three times a week   Frequency of Social Gatherings with Friends and Family: More than three times a week   Attends Religious Services: 1 to 4 times per year   Active Member of Clubs or Organizations: Yes   Attends Archivist Meetings: 1 to 4 times per year   Marital Status: Divorced   Past Surgical History:  Procedure Laterality Date   ANKLE SURGERY     CARPAL TUNNEL RELEASE     carpel tunnel     IR ANGIO INTRA EXTRACRAN SEL COM CAROTID INNOMINATE BILAT MOD SED  01/17/2021   IR ANGIO VERTEBRAL SEL SUBCLAVIAN INNOMINATE UNI R MOD SED  01/17/2021   IR CT HEAD LTD  06/30/2020   IR INTRA CRAN STENT  06/30/2020   IR PERCUTANEOUS ART THROMBECTOMY/INFUSION INTRACRANIAL INC DIAG ANGIO  06/30/2020   IR RADIOLOGIST EVAL & MGMT  08/26/2020   IR US GUIDE VASC ACCESS RIGHT  01/17/2021   RADIOLOGY WITH ANESTHESIA N/A 06/30/2020   Procedure: RADIOLOGY WITH ANESTHESIA;  Surgeon: Radiologist, Medication, MD;  Location: [facility];  Service: Radiology;  Laterality: N/A;   Past Medical History:  Diagnosis Date   Acute ischemic left MCA stroke ([place]) 06/30/2020   Acute ischemic stroke (HCC)    Acute stroke due to occlusion of left middle cerebral artery ([place]) 06/30/2020   Age-related nuclear cataract of both eyes 09/28/2015   Arthralgia of multiple joints 09/28/2015   Asthma    Borderline diabetes     Cerebrovascular accident (CVA) ([place])    Chronic bilateral thoracic back pain 06/05/2018   Chronic renal insufficiency, stage 1 08/11/2014   Diabetes mellitus type 2 in obese ([place])    Dyslipidemia    Dysphagia, post-stroke    Erosive gastritis 07/20/2019   Formatting of this note might be different from the original. On EGD 07/2019   Esophagitis 07/20/2019   Formatting of this note might be different from the original. Added automatically from request for surgery [id number]  Formatting of this note might be different from the original. On EGD 07/2019   Essential hypertension 09/28/2015   Essential thrombocytosis ([place]) 09/28/2015   Gallstones 08/18/2019   Formatting of this note might be different from the original. Added automatically from request for surgery [id number]   Gastroesophageal reflux disease without esophagitis 09/28/2015   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   Hyperlipidemia, unspecified 03/16/2013   Hypertension    Keratoconjunctivitis sicca of both eyes not specified as Sjogren's 09/28/2015   Left middle cerebral artery stroke ([place]) 07/09/2020  Morbid obesity ([place]) 05/21/2013   Seizures (HCC)    Stage 3b chronic kidney disease (HCC)    Status post gastric bypass for obesity 09/28/2015   BP 123/75    Pulse 78    Temp 97.8 F (36.6 C)    Ht 5\' 2"  (1.575 m)    Wt 147 lb 3.2 oz (66.8 kg)    LMP 09/28/2012    SpO2 99%    BMI 26.92 kg/m   Opioid Risk Score:   Fall Risk Score:  `1  Depression screen PHQ 2/9  Depression screen [facility] 2/9 07/14/2021 06/30/2021 05/24/2021 01/12/2021 09/02/2020 08/04/2020  Decreased Interest 1 0 1 0 0 0  Down, Depressed, Hopeless 1 1 1  0 0 1  PHQ - 2 Score 2 1 2  0 0 1  Altered sleeping - - 0 - - 0  Tired, decreased energy - - 0 - - 0  Change in appetite - - 0 - - 0  Feeling bad or failure about yourself  - - 1 - - 1  Trouble concentrating - - 1 - - 0  Moving slowly or fidgety/restless - - 0 - - 1  Suicidal thoughts - - 0 - - 0  PHQ-9 Score - - 4 -  - 3  Difficult doing work/chores - - Somewhat difficult - - -    Review of Systems  Constitutional: Negative.   HENT: Negative.    Eyes: Negative.   Respiratory: Negative.    Cardiovascular: Negative.   Gastrointestinal:  Positive for diarrhea.  Endocrine: Negative.   Genitourinary: Negative.   Musculoskeletal: Negative.   Skin: Negative.   Allergic/Immunologic: Negative.   Neurological: Negative.   Hematological:  Bruises/bleeds easily.       Apixaban  Psychiatric/Behavioral:  Positive for dysphoric mood.   All other systems reviewed and are negative.     Objective:   Physical Exam Vitals reviewed.  Constitutional:      Appearance: She is normal weight.  HENT:     Head: Normocephalic and atraumatic.  Eyes:     Extraocular Movements: Extraocular movements intact.     Conjunctiva/sclera: Conjunctivae normal.     Pupils: Pupils are equal, round, and reactive to light.  Musculoskeletal:     Comments: No pain with upper extremity and lower extremity range of motion no pain with cervical spine range of motion No joint swelling noted  Skin:    General: Skin is warm and dry.  Neurological:     Mental Status: She is alert.     Comments: Motor strength is 5/5 bilateral deltoid, bicep, tricep, grip, hip flexor, knee extensor, ankle dorsiflexor and plantar flexor Negative straight leg raise bilaterally Ambulates without assist device no evidence of toe drag and instability Remains with receptive aphasia.  Needs visual cues for manual muscle testing and other simple commands.  Continues with Neologistic errors and paraphasic errors  Psychiatric:        Mood and Affect: Mood normal.          Assessment & Plan:  1.  History of left MCA distribution infarct M2 inferior branch with persistent receptive aphasia.  Has had recurrent infarcts that have been fairly silent in terms of longer-term new deficits.  Patient will follow-up with neurology. No new round of inpatient or  outpatient therapy recommended at this time. Physical medicine rehab follow-up on as-needed basis. Patient here with daughter, difficult to communicate due to aphasia, prolonged service as result.

## 2021-07-17 ENCOUNTER — Ambulatory Visit (INDEPENDENT_AMBULATORY_CARE_PROVIDER_SITE_OTHER): Payer: Medicare Other

## 2021-07-17 DIAGNOSIS — I639 Cerebral infarction, unspecified: Secondary | ICD-10-CM

## 2021-07-17 LAB — CUP PACEART REMOTE DEVICE CHECK
Date Time Interrogation Session: 20230212230621
Implantable Pulse Generator Implant Date: 20220407

## 2021-07-18 ENCOUNTER — Other Ambulatory Visit: Payer: Self-pay

## 2021-07-18 ENCOUNTER — Other Ambulatory Visit: Payer: Self-pay | Admitting: *Deleted

## 2021-07-18 NOTE — Patient Outreach (Signed)
[facility]) Care Management Telephonic RN Care Manager Note   07/18/2021 Name:  Sumire Halbleib MRN:  [id number] DOB:  10/27/1955  Summary: Follow up outreach for EMMI stroke  Ms Robby Pirani [name] Confirms she is doing well, attending therapies and all doctor appointments She reports she continues a smaller intervals get frustrated with speech concerns but manageable  [name] mentions pt has been referred to a cardiologist ( Dr Otho Perl) in the [facility] system that the patient does not prefer. Care every where indicates an appointment with Dr Otho Perl on  07/27/21.  Ms Thom nor her daughter prefers a cardiology referral to a [facility] provider. Preference is to remain within the same system as Dr Shonna Chock ([place] [phone]  fax [phone]).   Behavior health appointment has been set up but not showing in EPIC Dr Read Drivers, physical med and rehab MD, released her on 07/14/21 Continue follow up with neurology  Need colonoscopy, breast exam ( for found breast lumps) - Daughter completed a message to pcp for mammogram and colonoscopy referrals during this outreach  [name] has not tried use of my chart since suggested by RN C  Recommendations/Changes made from today's visit: Assessed for worsening symptoms Discussed the differences in cardiology and cardiology physiologist, hospital systems Discussed provider access to care everywhere Discussed again the benefits of using my chart to communicate with providers if having difficulty with wait time when calling the offices etc.  Instructed daughter step by step on how to use my chart to message pcp to update that the patient is needing a mammogram and colonoscopy referrals -leaving daughter number and a time for outreach Encouraged daughter to merge the my chart systems and appointments  Discussed [facility] EMMI program, [facility] case closure and connection to possible other insurance or pcp  nurse resources for continuity as much as possible [facility] [place] nurse program/UHC accepting cardiologists  Subjective: Karolyne Timmons is an 66 y.o. year old female who is a primary patient of [name], PA-C. The care management team was consulted for assistance with care management and/or care coordination needs.    Telephonic RN Care Manager completed Telephone Visit today.   Objective:  Medications Reviewed Today     Reviewed by [name], RN (Registered Nurse) on 07/18/21 at 1639  Med List Status: <None>   Medication Order Taking? Sig Documenting Provider Last Dose Status Informant  acetaminophen (TYLENOL) 500 MG tablet [id number] No Take 1,000 mg by mouth every 6 (six) hours as needed for moderate pain or headache. [provider] Taking Active Child  amitriptyline (ELAVIL) 25 MG tablet [id number] No Take 1 tablet (25 mg total) by mouth at bedtime. Frann Rider, NP Taking Active   amLODipine (NORVASC) 10 MG tablet [id number] No Take by mouth. [provider] Taking Active   apixaban (ELIQUIS) 5 MG TABS tablet [id number] No TAKE 1 TABLET BY MOUTH TWICE A DAY Camnitz, Will Hassell Done, MD Taking Active   aspirin EC 81 MG tablet [id number] No Take 1 tablet (81 mg total) by mouth daily. Swallow whole. Frann Rider, NP Taking Active   atorvastatin (LIPITOR) 40 MG tablet [id number] No Take 1 tablet (40 mg total) by mouth at bedtime. [name], MD Taking Expired 06/29/21 2359 Child  KLOR-CON M20 20 MEQ tablet [id number] No Take 20 mEq by mouth daily. [provider] Taking Active Child  levETIRAcetam (KEPPRA) 1000 MG tablet [id number] No Take  1,000 mg by mouth 2 (two) times daily. [provider] Taking Active   Multiple Vitamin (MULTIVITAMIN WITH MINERALS) TABS tablet [id number] No Take 1 tablet by mouth daily. [provider] Taking Active Child  omeprazole (PRILOSEC) 40 MG capsule [id number] No Take 1 capsule by mouth  daily. [provider] Taking Active   ondansetron (ZOFRAN-ODT) 4 MG disintegrating tablet [id number] No Take 1 tablet (4 mg total) by mouth every 8 (eight) hours as needed for up to 10 doses for nausea or vomiting. [name], MD Taking Active   predniSONE (DELTASONE) 50 MG tablet [id number] No Pre medication for contrast allergy. Take 50 mg Prednisone at 7:30 pm on 8/8, at 1:30 am on 8/9; and 7:30 am on 8/9 (with Benadryl 50 mg at 7:30 am.) [provider] Taking Active   Pseudoephedrine-APAP-DM (DAYQUIL PO) [id number] No Take 1 capsule by mouth daily as needed (coldy symptoms). [provider] Taking Active Child  ursodiol (ACTIGALL) 250 MG tablet [id number] No Take 250 mg by mouth 2 (two) times daily. [provider] Taking Active Child  valsartan (DIOVAN) 160 MG tablet [id number] No Take by mouth. [provider] Taking Active              SDOH:  (Social Determinants of Health) assessments and interventions performed:  SDOH Interventions    Flowsheet Row Most Recent Value  SDOH Interventions   Food Insecurity Interventions Intervention Not Indicated  Financial Strain Interventions Intervention Not Indicated  Transportation Interventions Intervention Not Indicated       Care Plan  Review of patient past medical history, allergies, medications, health status, including review of consultants reports, laboratory and other test data, was performed as part of comprehensive evaluation for care management services.   Care Plan : RN Care Manager Plan of Care  Updates made by [name], RN since 07/18/2021 12:00 AM     Problem: Complex Care Coordination Needs and disease management in patient with stroke   Priority: High  Onset Date: 05/24/2021     Long-Range Goal: Establish Plan of Care for Management Complex SDOH Barriers, disease management and Care Coordination Needs in patient with stroke   Start Date: 05/24/2021  This  Visit's Progress: On track  Recent Progress: On track  Priority: High  Note:   Current Barriers:  Knowledge Deficits related to plan of care for management of Stroke  Care Coordination needs related to Limited education about stroke, coping with speech difficulties, follow up appointments, my chart access, cardiology/neurology providers* PMH 4 previous strokes Residual speech difficulties  Barriers: Health Behaviors Knowledge Other Coping, Changes in primary care office staff, concerns with outreaches to pcp office (wait time/response)   RN CM Clinical Goal(s):  Patient/daughter will verbalize understanding of plan for management of stroke as evidenced by prevention of re admission in next 30 days  through collaboration with RN Care manager, provider, and care team.  Pt did return to ED on 06/01/21 Dx with covid- discharged home 06/30/21 Goals MET- coordination of care with covering pcp, cardiology referral, loop recorder that was questionably last properly checked in November 2022 is now properly intact/set up and properly being monitored, better attendance to speech therapy/no longer missing sessions, my chart usage continues, Received medicines to assist with anxiety & sleep, improved sleep reported -sent list of [facility] providers as requested 07/18/21 last admission in December 2022 Last ED visit 06/01/21 Re admission goal MET  Interventions: Outreaches to provide care coordination and disease management/education Inter-disciplinary  care team collaboration (see longitudinal plan of care) Evaluation of current treatment plan related to  self management and patient's adherence to plan as established by provider 06/13/21 sent e-mail to daughter with list of in network united healthcare medicare providers as discussed - Sent/faxed letter to pcp with identified needs for pt 06/15/21 Outreaches to pcp/Loop monitor staff- messages left; step/step telephonic education to daughter to link my chart  accounts and appointments- Reviewed upcoming speech and rehab appointments 06/16/21 collaboration with pcp office [name]). Clarified her pcp on maternity leave and pt last saw [name] PA and to see her again. Had her to updated contact number to now outreach daughter primarily, to schedule follow office visit for cardiac clearance for colonoscopy, post hospital follow up, referral to ([facility]) counselor for coping concerns, referral to cardiology prn- Left RN CM office number  sent notes and letters previously sent to [place] to [place] later noted an appointment was scheduled in [facility] for pt to see pcp staff [name], PA on 06/23/20 Re emailed a list of [place] providers to daughter while she was on the phone Connected daughter and loop monitor staff [name] [phone] to review any possible noted abnormalities. [name] confirms pt loop recorder has not been remotely monitored since 02/2021 related to app error. Tech support info provided to daughter Dr Baird Kay is the Cardiology physiologist, no cardiology  06/23/21 Unsuccessful outreach, EPIC chart review, Speech sessions continue & doing well. No notes for a 06/23/21 pcp visit, pending cardiology visit with Dr [name] 06/27/21 for atrial fibrillation- Sent EMMI education via e-mail to daughter for vitamin K diet and anticoagulants for atrial fibrillation. Noted other EMMIs not viewed 06/30/21 Suggested [name] using my chart to better communicate with all providers for a more possible prompt response Discussed how atrial fibrillation can cause possible clots that can lead to stroke, stroke & seizure prevention Encouraged more use picture cards with patient to assist with speech Dicussed Artemis Koller was listed as a behavioral health provider seen previously by pt. Encouraged daughter to inquire if pt had interest in re establishing services with this provider or any other counselor Commend [name] for encouraging her mother's  faith 07/18/21  Improved Attending all therapies, office visits, has behavioral appointment, Not preferring referred cardiologist, assisted to communicate with pcp via my chart for mammogram/colonoscopy Case closure with transfer to possible [facility] RN program or pcp RN program Assessed for worsening symptoms Discussed the differences in cardiology and cardiology physiologist, hospital systems Discussed provider access to care everywhere Discussed again the benefits of using my chart to communicate with providers if having difficulty with wait time when calling the offices etc.  Instructed daughter step by step on how to use my chart to message pcp to update that the patient is needing a mammogram and colonoscopy referrals -leaving daughter number and a time for outreach Encouraged daughter to merge the my chart systems and appointments  Discussed [facility] EMMI program, [facility] case closure and connection to possible other insurance or pcp nurse resources for continuity as much as possible [name] [facility] nurse program/UHC accepting cardiologists  Stroke:  (Status:Goal Met.) Long Term Goal 07/18/21 less headaches, better sleep, improved speech difficulties released from Rehab MD services to Neurology  Reviewed Importance of taking all medications as prescribed Reviewed Importance of attending all scheduled provider appointments Advised to report any changes in symptoms or exercise tolerance Screening for signs and symptoms of depression related to chronic disease  state Assessed social determinant of health barriers Reviewed referrals to outpatient therapy Assessed for fall status and safety in the home Reviewed the stroke history RN CM was able to find in EPIC with daughter CVAs in 1/22, 2/22, 12/22  Patient Goals/Self-Care Activities: Take all medications as prescribed Attend all scheduled provider appointments Attend church or other social activities Perform all self care activities independently   Call provider office for new concerns or questions  Attend speech therapy sessions as ordered  Follow Up Plan:   case closure after offered resource for nurse program (UHC/pcp)  Encouraged outreach to RN CM prn        Plan: case closure after offered resource for nurse program ((UHC/pcp) Encouraged outreach to [facility] prn   [employer] Lavina Hamman, RN, BSN, [place] Coordinator Office number [phone] [address] [facility] number [phone] Fax number [phone]

## 2021-07-19 NOTE — Progress Notes (Signed)
Carelink Summary Report / Loop Recorder 

## 2021-07-20 ENCOUNTER — Other Ambulatory Visit: Payer: Self-pay | Admitting: *Deleted

## 2021-07-20 NOTE — Patient Outreach (Signed)
[facility]) Care Management  07/20/2021  Regena Delucchi 04/10/1956 [id number]   [facility] Care coordination- UHC case management , cardiology  Spoke with catherine at  [facility] care [facility]) [phone] to attempt to warm transfer patient to her united healthcare care management program  Barnetta Chapel referred RN CM to the customer service line for [facility] case management services referral   Updated daughter via e-mail  Spoke with customer service staff, Alexis at [phone] RN CM referred to [phone] Cathy Answered for Clinical Quality Department This is the incorrect area Provided the [facility] navigator/care coordinator number [phone]  Outreach to cardiology [phone] spoke with [name] to confirm Dr Lennie Odor is the patients Cardiologist and Cardiology physiologist for the patient. She also confirms there are cardiologists accepting new patients at this location and pt will need to be referred  Routed 09/08/20 cardiology note for Dr Baird Kay to pcp   [address] [place] transferred to [place] with [place] transferred  [place] with Summer who attempted to assist but after an extensive wait time, she and RN CM agreed to conclude the outreach attempt for [facility] case management services  Provider express number [phone] provided as   Updated daughter via e-mail  Plan [facility] RN CM will follow up with patient within the next 30+business days    Chau Savell L. Lavina Hamman, RN, BSN, [place] Coordinator Office number [phone]

## 2021-07-21 ENCOUNTER — Other Ambulatory Visit: Payer: Self-pay | Admitting: Adult Health

## 2021-07-21 ENCOUNTER — Other Ambulatory Visit: Payer: Self-pay | Admitting: *Deleted

## 2021-07-21 NOTE — Patient Outreach (Signed)
[facility] [facility]) Care Management  07/21/2021  Courtney Burke 12/26/1955 [id number]   [facility] Case closure   Care Plan : RN Care Manager Plan of Care  Updates made by [name], RN since 07/21/2021 12:00 AM  Completed 07/21/2021   Problem: Complex Care Coordination Needs and disease management in patient with stroke Resolved 07/21/2021  Priority: High  Onset Date: 05/24/2021     Long-Range Goal: Establish Plan of Care for Management Complex SDOH Barriers, disease management and Care Coordination Needs in patient with stroke Completed 07/21/2021  Start Date: 05/24/2021  This Visit's Progress: On track  Recent Progress: On track  Priority: High  Note:   Current Barriers:  Knowledge Deficits related to plan of care for management of Stroke  Care Coordination needs related to Limited education about stroke, coping with speech difficulties, follow up appointments, my chart access, cardiology/neurology providers* PMH 4 previous strokes Residual speech difficulties  Barriers: Health Behaviors Knowledge Other Coping, Changes in primary care office staff, concerns with outreaches to pcp office (wait time/response)   RN CM Clinical Goal(s):  Patient/daughter will verbalize understanding of plan for management of stroke as evidenced by prevention of re admission in next 30 days  through collaboration with RN Care manager, provider, and care team.  Pt did return to ED on 06/01/21 Dx with covid- discharged home 06/30/21 Goals MET- coordination of care with covering pcp, cardiology referral, loop recorder that was questionably last properly checked in November 2022 is now properly intact/set up and properly being monitored, better attendance to speech therapy/no longer missing sessions, my chart usage continues, Received medicines to assist with anxiety & sleep, improved sleep reported -sent list of [facility] providers as requested 07/18/21 last admission in December 2022 Last  ED visit 06/01/21 Re admission goal MET 07/21/21 case closure for stroke EMMI pt  Interventions: Outreaches to provide care coordination and disease management/education Inter-disciplinary care team collaboration (see longitudinal plan of care) Evaluation of current treatment plan related to  self management and patient's adherence to plan as established by provider 06/13/21 sent e-mail to daughter with list of in network united healthcare medicare providers as discussed - Sent/faxed letter to pcp with identified needs for pt 06/15/21 Outreaches to pcp/Loop monitor staff- messages left; step/step telephonic education to daughter to link my chart accounts and appointments- Reviewed upcoming speech and rehab appointments 06/16/21 collaboration with pcp office [name]). Clarified her pcp on maternity leave and pt last saw [name] PA and to see her again. Had her to updated contact number to now outreach daughter primarily, to schedule follow office visit for cardiac clearance for colonoscopy, post hospital follow up, referral to ([facility]) counselor for coping concerns, referral to cardiology prn- Left RN CM office number  sent notes and letters previously sent to [place] to [place] later noted an appointment was scheduled in [facility] for pt to see pcp staff [name], PA on 06/23/20 Re emailed a list of [place] providers to daughter while she was on the phone Connected daughter and loop monitor staff [name] to review any possible noted abnormalities. [name] confirms pt loop recorder has not been remotely monitored since 02/2021 related to app error. Tech support info provided to daughter Dr Baird Kay is the Cardiology physiologist, no cardiology  06/23/21 Unsuccessful outreach, EPIC chart review, Speech sessions continue & doing well. No notes for a 06/23/21 pcp visit, pending cardiology visit with Dr [name] 06/27/21 for atrial  fibrillation- Sent EMMI education via e-mail to daughter for  vitamin K diet and anticoagulants for atrial fibrillation. Noted other EMMIs not viewed 06/30/21 Suggested [name] using my chart to better communicate with all providers for a more possible prompt response Discussed how atrial fibrillation can cause possible clots that can lead to stroke, stroke & seizure prevention Encouraged more use picture cards with patient to assist with speech Dicussed [name] was listed as a behavioral health provider seen previously by pt. Encouraged daughter to inquire if pt had interest in re establishing services with this provider or any other counselor Commend [name] for encouraging her mother's faith 07/18/21  Improved Attending all therapies, office visits, has behavioral appointment, Not preferring referred cardiologist, assisted to communicate with pcp via my chart for mammogram/colonoscopy Case closure with transfer to possible [facility] RN program or pcp RN program Assessed for worsening symptoms Discussed the differences in cardiology and cardiology physiologist, hospital systems Discussed provider access to care everywhere Discussed again the benefits of using my chart to communicate with providers if having difficulty with wait time when calling the offices etc.  Instructed daughter step by step on how to use my chart to message pcp to update that the patient is needing a mammogram and colonoscopy referrals -leaving daughter number and a time for outreach Encouraged daughter to merge the my chart systems and appointments  Discussed [facility] EMMI program, [facility] case closure and connection to possible other insurance or pcp nurse resources for continuity as much as possible [name] [facility] nurse program/UHC accepting cardiologists 07/20/21 attempt to get [facility] CM services initiated unsuccessfully. Confirmed with [name] at cardiology office that Dr Curt Bears is pt's cardiologist and physiologist. Updated daughter vis e-mail   Stroke:  (Status:Goal Met.) Long Term  Goal 07/18/21 less headaches, better sleep, improved speech difficulties released from Rehab MD services to Neurology  Reviewed Importance of taking all medications as prescribed Reviewed Importance of attending all scheduled provider appointments Advised to report any changes in symptoms or exercise tolerance Screening for signs and symptoms of depression related to chronic disease state Assessed social determinant of health barriers Reviewed referrals to outpatient therapy Assessed for fall status and safety in the home Reviewed the stroke history RN CM was able to find in EPIC with daughter CVAs in 1/22, 2/22, 12/22  Patient Goals/Self-Care Activities: Take all medications as prescribed Attend all scheduled provider appointments Attend church or other social activities Perform all self care activities independently  Call provider office for new concerns or questions  Attend speech therapy sessions as ordered  Follow Up Plan:   case closure after offered resource for nurse program (UHC/pcp)  Encouraged outreach to RN CM prn       Joelene Millin L. Lavina Hamman, RN, BSN, [place] Coordinator Office number [phone] Mobile number [phone]  Main [facility] number [phone] Fax number [phone]'

## 2021-07-24 DIAGNOSIS — Z8673 Personal history of transient ischemic attack (TIA), and cerebral infarction without residual deficits: Secondary | ICD-10-CM | POA: Insufficient documentation

## 2021-07-28 ENCOUNTER — Other Ambulatory Visit: Payer: Self-pay | Admitting: *Deleted

## 2021-07-28 NOTE — Patient Outreach (Signed)
[place] [facility]) Care Management  07/28/2021  Courtney Burke 01-06-1956 [id number]   [facility] Care coordination- external care management services  RN CM outreached to  [facility] care [facility]) provider express number [phone] spoke with Venora Maples who informed RN CM she had reached [facility] supplemental coverage department  He attempted to transfer RN CM to [phone] - extended wait time  RN CM outreached to pt's pcp office and spoke with Veita at [phone] inquiry about a chronic care management services connected to the office to further assist patient and daughter   Pt to be referred to complex care program at [address] [place]  [phone]  RN CM left her number for any questions  E-mail to daughter to provide an update and to wish pt a pre happy birthday  Encouraged daughter to return a call if needed  Online information about the complex care program provided   [place] L. Lavina Hamman, RN, BSN, [place] Coordinator Office number [phone]

## 2021-07-31 ENCOUNTER — Other Ambulatory Visit: Payer: Self-pay | Admitting: *Deleted

## 2021-07-31 ENCOUNTER — Encounter: Payer: Self-pay | Admitting: *Deleted

## 2021-07-31 NOTE — Patient Outreach (Signed)
[place] [facility]) Care Management  07/31/2021  Courtney Burke [birth year] [id number]   [place] coordination- collaboration with pcp staff  RN CM received a call from [place] from [phone] [name] and RN CM reviewed the 07/28/21 RN CM outreach to the pcp office with attempt to request assist with connecting patient and daughter to any available external care management services available to her  The complex care program offered on 07/28/21 is not appropriate for the patient  Questions answered for [name] who confirms she understands the possible resource patient may need  [name], RN, BSN, [place] Coordinator Office number [phone]

## 2021-08-21 ENCOUNTER — Ambulatory Visit (INDEPENDENT_AMBULATORY_CARE_PROVIDER_SITE_OTHER): Payer: Medicare Other

## 2021-08-21 DIAGNOSIS — I639 Cerebral infarction, unspecified: Secondary | ICD-10-CM | POA: Diagnosis not present

## 2021-08-21 NOTE — Progress Notes (Signed)
? ?Cardiology Office Note ?Date:  08/21/2021  ?Patient ID:  Courtney Burke, Courtney Burke 1955-06-13, MRN [id number] ?PCP:  Courtney Octave, PA-C  ?Electrophysiologist: Dr. Curt Bears ? ?  ?Chief Complaint: recent strokes ? ?History of Present Illness: ?Courtney Burke is a 66 y.o. female with history of DM, HTN, HLD, CKD (IIIb), obesity s/p gastric bypass 2017, Lap sleeve gastrectomy revision to sleeve gastric bypass surgery on 05/09/20.), strokes, post stroke seizure d/o ? ?She was referred to Dr. Curt Bears April 2022, after being admitted February 2022 with confusion, seizure-like activity, drooling, left gaze, and right-sided weakness.  She was found to have a MCA occlusion and is status post MCA stenting.  This was an embolic pattern with an unclear source.  She had a full work-up without cause for her stroke, for consideration of loop implant ?She underwent monitor implant at her visit. ? ?Afib was subsequently found and referred to the AFib clinic May 2022, in d/w Dr. Leonie Man,  stop ASA but to continue brilinta for MCA stenting with resultant TICI 2C revascularization and add eliquis 5 mg bid. ?AT her f/u there 10/20/20, she was doing well, tolerating medication changes. ? ?Re admitted from 05/10/21 to 05/12/21 for acute temporooccipital cerebral infarction, seizures and AKI, ASA added for triple therapy ? ?05/15/21 readmitted with more symptoms and found to have extension of her stroke, suspect 2/2 hypotension ?Continued on Eliquis, ASA ?12/14/ suspect seizure and keppra increased ? ?Saw neurology Jan 2023 with no new events/symptoms, was not taking the ASA but complainct with Eliquis, and advised to resume ASA as well ? ? ?TODAY ?She is accompanied by her daughter ?Observed to walk in, slightly unsteady gate ?She answers her name to question of what is her birth date, though eventually answers appropriately ?Initially she is communicating well, though sudden absence in verbal response ?She did not lose  postural tone, though had a blank stare and no response, facial expression notes clenched teeth ?She was on the exam table, brought to supine without change in MS< code called for siezure. ?Her daughter confirms this is how her known seizures appear ?BP 128/70 ?She was connected to loop tablet and was SR 70's-80's. ?She started to slowly start mumbling, stuttering, and then connecting visually to [place]. ?She had no convulsive activity. ?BP and HR/rhythm remained stable throughout. ?She was post ictal, able to eventually tell us who her daughter was ?Remained with repetitive speech patter, confusion for abut 10 minutes then steadily improving. ?No focal neuro/motor deficits are appreciated. ?DOD felt that if she cleared to baseline, would be OK to go home. ? ?Her daughter confirmed this as usual seizure for her. And did get back to her baseline ? ? ?Device information ?MDT LINQ implanted 09/08/2020, cryptogenic stroke ? ? ?Past Medical History:  ?Diagnosis Date  ? Acute ischemic left MCA stroke ([place]) 06/30/2020  ? Acute ischemic stroke ([place])   ? Acute stroke due to occlusion of left middle cerebral artery ([place]) 06/30/2020  ? Age-related nuclear cataract of both eyes 09/28/2015  ? Arthralgia of multiple joints 09/28/2015  ? Asthma   ? Borderline diabetes   ? Cerebrovascular accident (CVA) ([place])   ? Chronic bilateral thoracic back pain 06/05/2018  ? Chronic renal insufficiency, stage 1 08/11/2014  ? Diabetes mellitus type 2 in obese [facility])   ? Dyslipidemia   ? Dysphagia, post-stroke   ? Erosive gastritis 07/20/2019  ? Formatting of this note might be different from the original. On EGD 07/2019  ? Esophagitis 07/20/2019  ?  Formatting of this note might be different from the original. Added automatically from request for surgery [id number]  Formatting of this note might be different from the original. On EGD 07/2019  ? Essential hypertension 09/28/2015  ? Essential thrombocytosis ([place]) 09/28/2015  ? Gallstones 08/18/2019  ?  Formatting of this note might be different from the original. Added automatically from request for surgery [id number]  ? Gastroesophageal reflux disease without esophagitis 09/28/2015  ? 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  ? Hyperlipidemia, unspecified 03/16/2013  ? Hypertension   ? Keratoconjunctivitis sicca of both eyes not specified as Sjogren's 09/28/2015  ? Left middle cerebral artery stroke ([place]) 07/09/2020  ? Morbid obesity ([place]) 05/21/2013  ? Seizures ([place])   ? Stage 3b chronic kidney disease ([place])   ? Status post gastric bypass for obesity 09/28/2015  ? ? ?Past Surgical History:  ?Procedure Laterality Date  ? ANKLE SURGERY    ? CARPAL TUNNEL RELEASE    ? carpel tunnel    ? IR ANGIO INTRA EXTRACRAN SEL COM CAROTID INNOMINATE BILAT MOD SED  01/17/2021  ? IR ANGIO VERTEBRAL SEL SUBCLAVIAN INNOMINATE UNI R MOD SED  01/17/2021  ? IR CT HEAD LTD  06/30/2020  ? IR INTRA CRAN STENT  06/30/2020  ? IR PERCUTANEOUS ART THROMBECTOMY/INFUSION INTRACRANIAL INC DIAG ANGIO  06/30/2020  ? IR RADIOLOGIST EVAL & MGMT  08/26/2020  ? IR US GUIDE VASC ACCESS RIGHT  01/17/2021  ? RADIOLOGY WITH ANESTHESIA N/A 06/30/2020  ? Procedure: RADIOLOGY WITH ANESTHESIA;  Surgeon: Radiologist, Medication, MD;  Location: [place];  Service: Radiology;  Laterality: N/A;  ? ? ?Current Outpatient Medications  ?Medication Sig Dispense Refill  ? acetaminophen (TYLENOL) 500 MG tablet Take 1,000 mg by mouth every 6 (six) hours as needed for moderate pain or headache.    ? amitriptyline (ELAVIL) 25 MG tablet TAKE 1 TABLET BY MOUTH EVERYDAY AT BEDTIME 90 tablet 1  ? amLODipine (NORVASC) 10 MG tablet Take by mouth.    ? apixaban (ELIQUIS) 5 MG TABS tablet TAKE 1 TABLET BY MOUTH TWICE A DAY 60 tablet 5  ? aspirin EC 81 MG tablet Take 1 tablet (81 mg total) by mouth daily. Swallow whole.    ? atorvastatin (LIPITOR) 40 MG tablet Take 1 tablet (40 mg total) by mouth at bedtime. 30 tablet 0  ? KLOR-CON M20 20 MEQ tablet Take 20 mEq by mouth daily.    ?  levETIRAcetam (KEPPRA) 1000 MG tablet Take 1,000 mg by mouth 2 (two) times daily.    ? Multiple Vitamin (MULTIVITAMIN WITH MINERALS) TABS tablet Take 1 tablet by mouth daily.    ? omeprazole (PRILOSEC) 40 MG capsule Take 1 capsule by mouth daily.    ? ondansetron (ZOFRAN-ODT) 4 MG disintegrating tablet Take 1 tablet (4 mg total) by mouth every 8 (eight) hours as needed for up to 10 doses for nausea or vomiting. 20 tablet 0  ? predniSONE (DELTASONE) 50 MG tablet Pre medication for contrast allergy. Take 50 mg Prednisone at 7:30 pm on 8/8, at 1:30 am on 8/9; and 7:30 am on 8/9 (with Benadryl 50 mg at 7:30 am.)    ? Pseudoephedrine-APAP-DM (DAYQUIL PO) Take 1 capsule by mouth daily as needed (coldy symptoms).    ? ursodiol (ACTIGALL) 250 MG tablet Take 250 mg by mouth 2 (two) times daily.    ? valsartan (DIOVAN) 160 MG tablet Take by mouth.    ? ?No current facility-administered medications for this visit.  ? ? ?Allergies:  Homatropine, Iodinated contrast media, Doxycycline, Sulfa antibiotics, Codeine, Hydrocodone bit-homatrop mbr, and Ace inhibitors  ? ?Social History:  The patient  reports that she has never smoked. She has never used smokeless tobacco. She reports that she does not drink alcohol and does not use drugs.  ? ?Family History:  The patient's family history includes Stroke in her maternal grandfather. ? ?ROS:  Please see the history of present illness.    ?All other systems are reviewed and otherwise negative.  ? ?PHYSICAL EXAM:  ?VS:  LMP 09/28/2012  BMI: There is no height or weight on file to calculate BMI. ?Well nourished, well developed, in no acute distress ?HEENT: normocephalic, atraumatic ?Neck: no JVD, carotid bruits or masses ?Cardiac:  RRR; no significant murmurs, no rubs, or gallops ?Lungs:  CTA b/l, no wheezing, rhonchi or rales ?Abd: soft, nontender ?MS: no deformity or atrophy ?Ext: no edema ?Skin: warm and dry, no rash ?Neuro:  as discussed above ?Psych: euthymic mood, full affect ? ?ILR  site is stable, no tethering or discomfort ? ? ?EKG: not done today ?Loop real-time monitoring remained SR ? ?Device interrogation done today and reviewed by myself:  ?Battery is good ?R wave 0.27m ?+ AFib, 0.1% burden ? ? ?

## 2021-08-22 LAB — CUP PACEART REMOTE DEVICE CHECK
Date Time Interrogation Session: 20230321075710
Implantable Pulse Generator Implant Date: 20220407

## 2021-08-24 ENCOUNTER — Encounter: Payer: Self-pay | Admitting: Physician Assistant

## 2021-08-24 ENCOUNTER — Ambulatory Visit (INDEPENDENT_AMBULATORY_CARE_PROVIDER_SITE_OTHER): Payer: Medicare Other | Admitting: Physician Assistant

## 2021-08-24 ENCOUNTER — Other Ambulatory Visit: Payer: Self-pay

## 2021-08-24 VITALS — BP 126/70 | HR 78 | Ht 62.0 in | Wt 148.8 lb

## 2021-08-24 DIAGNOSIS — R569 Unspecified convulsions: Secondary | ICD-10-CM | POA: Diagnosis not present

## 2021-08-24 DIAGNOSIS — Z4509 Encounter for adjustment and management of other cardiac device: Secondary | ICD-10-CM

## 2021-08-24 DIAGNOSIS — I480 Paroxysmal atrial fibrillation: Secondary | ICD-10-CM

## 2021-08-24 NOTE — Patient Instructions (Signed)
Medication Instructions:  ? ?Per Dr. Leonie Man: ? ?START TAKING KEPPRA 1000 MG IN THE AM AND 1500 MG IN THE PM ( PLEASE CONTACT HIS  ? ?(OFFICE FOR UPDATE PRESCRIPTION AND APPOINTMENT) ? ? ?*If you need a refill on your cardiac medications before your next appointment, please call your pharmacy* ? ? ?Lab Work:NONE ORDERED  TODAY ? ? ? ? ?If you have labs (blood work) drawn today and your tests are completely normal, you will receive your results only by: ?MyChart Message (if you have MyChart) OR ?A paper copy in the mail ?If you have any lab test that is abnormal or we need to change your treatment, we will call you to review the results. ? ? ?Testing/Procedures:NONE ORDERED  TODAY ? ? ? ?Follow-Up: ?At [facility], you and your health needs are our priority.  As part of our continuing mission to provide you with exceptional heart care, we have created designated Provider Care Teams.  These Care Teams include your primary Cardiologist (physician) and Advanced Practice Providers (APPs -  Physician Assistants and Nurse Practitioners) who all work together to provide you with the care you need, when you need it. ? ?We recommend signing up for the patient portal called "MyChart".  Sign up information is provided on this After Visit Summary.  MyChart is used to connect with patients for Virtual Visits (Telemedicine).  Patients are able to view lab/test results, encounter notes, upcoming appointments, etc.  Non-urgent messages can be sent to your provider as well.   ?To learn more about what you can do with MyChart, go to [email or web address].   ? ?Your next appointment:   ?1 month(s) ? ?The format for your next appointment:   ?In Person ? ?Provider:   ?You may see Dr.Camnitz  or one of the following Advanced Practice Providers on your designated Care Team:   ?Tommye Standard, PA-C ?Legrand Como "Jonni Sanger" Chalmers Cater, PA-C{ ? ? ? ? ?Other Instructions ? ?

## 2021-08-28 ENCOUNTER — Ambulatory Visit (INDEPENDENT_AMBULATORY_CARE_PROVIDER_SITE_OTHER): Payer: Medicare Other | Admitting: Adult Health

## 2021-08-28 ENCOUNTER — Encounter: Payer: Self-pay | Admitting: Adult Health

## 2021-08-28 ENCOUNTER — Telehealth: Payer: Self-pay | Admitting: Adult Health

## 2021-08-28 VITALS — BP 116/70 | HR 68 | Ht 62.0 in | Wt 145.0 lb

## 2021-08-28 DIAGNOSIS — G441 Vascular headache, not elsewhere classified: Secondary | ICD-10-CM | POA: Diagnosis not present

## 2021-08-28 DIAGNOSIS — I69398 Other sequelae of cerebral infarction: Secondary | ICD-10-CM | POA: Diagnosis not present

## 2021-08-28 DIAGNOSIS — I63411 Cerebral infarction due to embolism of right middle cerebral artery: Secondary | ICD-10-CM | POA: Diagnosis not present

## 2021-08-28 DIAGNOSIS — G40919 Epilepsy, unspecified, intractable, without status epilepticus: Secondary | ICD-10-CM

## 2021-08-28 DIAGNOSIS — R569 Unspecified convulsions: Secondary | ICD-10-CM

## 2021-08-28 MED ORDER — LEVETIRACETAM 750 MG PO TABS: 1500.0000 mg | ORAL_TABLET | Freq: Two times a day (BID) | ORAL | 5 refills | Status: DC

## 2021-08-28 NOTE — Patient Instructions (Addendum)
Increase keppra to '1500mg'$  twice daily ? ?Schedule an EEG to ensure no underlying or silent seizures  ? ?Please follow up with your psychiatrist to further discuss sertraline ? ?Continue amitriptyline for headaches  ? ? ? ? ?Follow up in 3 months or call earlier if needed ? ? ?

## 2021-08-28 NOTE — Progress Notes (Signed)
?[facility] ?[address] ?[name] ?[phone] ? ?     OFFICE FOLLOW-UP NOTE ? ?[name] ?Date of Birth:  [birth year] ?Medical Record Number:  [id number]  ? ? ?Reason for visit: acute visit for headaches ?Primary neurologist: Dr. Leonie Man ? ?Chief Complaint  ?Patient presents with  ? Follow-up  ?  Rm 3 with daughter Courtney Burke ?Pt is well, here to FU on recent headaches  ?  ? ?HPI:  ? ?Update 08/28/2021 JM: Returns for acute visit due to complaints of worsening headaches.  At prior visit 2 months ago, she was started on amitriptyline for headache complaints since her stroke in 05/2021. Has been taking amitriptyline '25mg'$  nightly - per daughter, thought they were improving as no mention of having headaches since starting amitriptyline but at cardiology visit last week, was complaining of headache and not feeling right upon arriving to appt. She was able to ambulate back but walking slow, sat down and then stopped talking. Mouth was twisted towards the side, teeth clenched, and lost consciousness for 2-3 minutes, she did have postictal confusion but improved after a few minutes. She was not sent to ED as she recovered back to baseline within reasonable time. No additional seizures since that time. She has been complaining of a headache since her seizure but no additional headaches over the past 2 days. Denies any new/worsening speech/language difficulties, weakness, vision changes or mental status changes. Daughter does report starting on sertraline '50mg'$  daily by psychiatry 2 weeks ago for depression, no other medication changes. No recent illness, trauma, falls, or other potential provoking factors. She does not drink alcohol or use recreational drugs.  Reports compliance with all medications. ? ? ? ? ? 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 fib after presenting with headache, worsening speech and confusion. MRA right ICA 50% stenosis and severe proximal right M2 stenosis, L M1 stent patent, no LVO.  On Eliquis twice daily and Brilinta PTA recommended continuation as well as adding aspirin 81 mg daily.  LDL 21 on atorvastatin.  A1c 5.5.  EEG generalized slowing but no seizures - remained on keppra. Discharged 12/7. Returned on 12/12 after episode of vomiting and syncopal episode with MRI showing extension of recent R MCA stroke likely in setting of hypotension/syncope in setting of right ICA and M2 stenosis. Continued Eliquis and asa -Brilinta discontinued.  Concern of possible seizure on 12/14 with staring off and not responsive, confusion for a few minutes after therefore increased Keppra to 1g BID. LDL 21 - decreased atorvastatin to 40 mg daily. BP stabilized on IVF, orthostatics unremarkable.  Residual expressive aphasia with word salad. Therapy evals recommended OP therapies.  ? ? ?Today she is accompanied by her daughter, Courtney Burke. Having headaches since recent stroke. Will take tylenol with some benefit but will return. Continues to work with SLP noting improvement, family working with her on reading out loud and conversations. Per daughter, patient will push herself too much at times and try to do too much at once. Family tries to ensure she takes breaks. Denies weakness, some imbalance but overall stable. Does c/o fear of additional strokes and difficulty sleeping at night due to mind wandering. No prior issues with depression/anxiety.  ? ?Reports compliance on Eliquis 5 mg twice daily and atorvastatin 40 mg daily without side effects. Has not been  taking aspirin.  Blood pressure today 151/78 and on recheck, 132/82.  Compliant on Keppra but over past week has been taking '500mg'$  BID due to difficulty refilling new dose (had '500mg'$  tabs left over from prior prescription). Was doing well on '1000mg'$  BID.   ? ?Of note, diagnosed with COVID-19 12/29 after presenting to ED with lightheadedness, dizziness, nausea and vomiting and possible loss of consciousness or near syncope.  Extensive work-up largely unrevealing except COVID-positive which was felt to be contributing to her symptoms. MR brain and MRA head/neck no acute findings or changes from prior recent imaging.  ? ?No further concerns at this time ? ? ? ? ?PERTINENT IMAGING ? ?Per hospitalization 05/15/2021 ?CT head - Slightly increased hypodensity in the right posterior temporal lobe and lateral occipital lobe, consistent with evolution of previously noted acute infarct. ?MRI  Extension of the acute infarction in the right MCA territory at the right temporoparietal junction, particularly along the inferior margin of the infarction and with a few tiny punctate satellite foci additionally present. ?MRA 12/7 - Approximate 50% stenosis at the supraclinoid right ICA, with probable additional short-segment severe proximal right M2 stenosis.  ?EEG cortical dysfunction arising from left and right temporal region, no seizure ? ? ?Per hospitalization 05/10/2021 ?CT head Old left MCA territory strokes. Newly seen loss of gray-white differentiation at the right temporoparietal junction suggesting acute infarction in that region today. No hemorrhage or mass effect ?MRI  1. Acute ischemic nonhemorrhagic infarct involving the right temporoccipital junction, posterior right MCA distribution.2. Chronic left MCA territory infarcts, stable from prior.3. Additional small remote right cerebellar infarct.4. Underlying age-related cerebral atrophy with mild chronic small vessel ischemic disease. ?MRA  head1. Negative intracranial MRA for large vessel occlusion.2. Approximate 50% stenosis at the supraclinoid right ICA, with probable additional short-segment severe proximal right M2 stenosis.3. Vascular stent in place within the left M1 segment. Flow through the stent itself not evaluated  by MRA, although patent flow seen distal to the stent.4. Short-segment severe left M2 stenosis, similar to previous.5. Wide patency of the posterior circulation ?MRA Neck Wide patency of both carotid artery systems and vertebral arteries within the neck. No hemodynamically significant stenosis or other acute vascular abnormality. ?2D Echo EF 55 to 60% ?EEG generalized slowing.  No seizures identified ? ? ? ? ? ?Update 02/13/2021 JM: Ms. Marcoe returns for acute visit in setting of recent seizure activity.  She is accompanied by her brother.  Seizure type event 02/07/2021 evaluated in ED with symptoms of headache and then tonic-clonic jerking lasting 5 to 6 minutes with residual confusion and bowel incontinence.  Initiated Keppra 500 mg twice daily. CT head unremarkable. ? ?No additional events since discharge.  Remains on Keppra 500 mg twice daily tolerating without side effects.  Reports slight worsening of speech since seizure event.  Was working with SLP and PT prior to seizure but has not yet restarted.  Continues to use a cane and denies any recent falls.  Denies new stroke/[name] symptoms.  Remains on Eliquis and atorvastatin.  Also compliant on Brilinta per IR recommendations.  No further concerns at this time. ? ?Update 01/04/2021 JM: Ms. Miler returns for 39-monthstroke follow-up accompanied by her brother, JMerry Proud  Working with SLP for residual aphasia but notes ongoing improvement. Gait has also been improving working with PT - uses cane - did have a fall last month but thankfully without injury.  Denies new stroke/[name] symptoms. ILR showed evidence of atrial fibrillation on 10/05/2020 -Eliquis initiated in  addition to [place]. ASA d/c'd.  She does have mild bruising but otherwise tolerating.  Has not yet scheduled diagnostic angiogram with Dr. Estanislado Pandy.  Compliant on atorvastatin without associated side effects.  Blood pressure 136/82. Monitors at home and typically stable 120s/70s.  No further concerns at this  time. ? ?Initial visit 10/05/2020 Dr. Leonie Man: Ms. Jann is a 66 year old African-American lady seen today for initial office follow-up visit following hospital admission for stroke in January 2022.  She is accompa

## 2021-08-28 NOTE — Telephone Encounter (Signed)
[name], Jaydeen Darley (on [place]) Thursday, 08/24/21, cardiologist recommended to get a sooner appt to see neurologist. Because she is having headaches, due to new blockage in right side of brain. ? ?Have scheduled pt today with Janett Billow, NP. ?

## 2021-08-29 LAB — COMPREHENSIVE METABOLIC PANEL
ALT: 12 IU/L (ref 0–32)
AST: 17 IU/L (ref 0–40)
Albumin/Globulin Ratio: 1.8 (ref 1.2–2.2)
Albumin: 4.2 g/dL (ref 3.8–4.8)
Alkaline Phosphatase: 164 IU/L — ABNORMAL HIGH (ref 44–121)
BUN/Creatinine Ratio: 13 (ref 12–28)
BUN: 20 mg/dL (ref 8–27)
Bilirubin Total: 0.4 mg/dL (ref 0.0–1.2)
CO2: 22 mmol/L (ref 20–29)
Calcium: 9.2 mg/dL (ref 8.7–10.3)
Chloride: 110 mmol/L — ABNORMAL HIGH (ref 96–106)
Creatinine, Ser: 1.53 mg/dL — ABNORMAL HIGH (ref 0.57–1.00)
Globulin, Total: 2.3 g/dL (ref 1.5–4.5)
Glucose: 80 mg/dL (ref 70–99)
Potassium: 4.9 mmol/L (ref 3.5–5.2)
Sodium: 144 mmol/L (ref 134–144)
Total Protein: 6.5 g/dL (ref 6.0–8.5)
eGFR: 37 mL/min/{1.73_m2} — ABNORMAL LOW (ref >59)

## 2021-08-29 LAB — LEVETIRACETAM LEVEL: Levetiracetam Lvl: 64.1 ug/mL — ABNORMAL HIGH (ref 10.0–40.0)

## 2021-08-30 NOTE — Progress Notes (Signed)
Carelink Summary Report / Loop Recorder 

## 2021-09-07 ENCOUNTER — Ambulatory Visit (INDEPENDENT_AMBULATORY_CARE_PROVIDER_SITE_OTHER): Payer: Medicare Other | Admitting: Neurology

## 2021-09-07 DIAGNOSIS — I69398 Other sequelae of cerebral infarction: Secondary | ICD-10-CM

## 2021-09-07 DIAGNOSIS — G40919 Epilepsy, unspecified, intractable, without status epilepticus: Secondary | ICD-10-CM

## 2021-09-25 ENCOUNTER — Ambulatory Visit (INDEPENDENT_AMBULATORY_CARE_PROVIDER_SITE_OTHER): Payer: Medicare Other

## 2021-09-25 DIAGNOSIS — I639 Cerebral infarction, unspecified: Secondary | ICD-10-CM

## 2021-09-25 LAB — CUP PACEART REMOTE DEVICE CHECK
Date Time Interrogation Session: 20230421230349
Implantable Pulse Generator Implant Date: 20220407

## 2021-09-25 NOTE — Progress Notes (Signed)
? ? ?Electrophysiology Office Note ?Date: 09/25/2021 ? ?ID:  [name], DOB Nov 21, 1955, MRN [id number] ? ?PCP: [name], PA-C ?Primary Cardiologist: None ?Electrophysiologist: Will Meredith Leeds, MD  ? ?CC: ILR follow-up ? ?Courtney Burke is a 66 y.o. female seen today for Dr. Curt Bears . she presents today for routine electrophysiology followup.  Since last being seen in our clinic, the patient reports doing very well. No further seizures. Mild lightheadedness at times with rapid standing. she denies chest pain, palpitations, dyspnea, PND, orthopnea, nausea, vomiting, syncope, edema, weight gain, or early satiety. ? ?Device History: ?Device information ?MDT LINQ implanted 09/08/2020, cryptogenic stroke ? ?Past Medical History:  ?Diagnosis Date  ? Acute ischemic left MCA stroke ([place]) 06/30/2020  ? Acute ischemic stroke ([place])   ? Acute stroke due to occlusion of left middle cerebral artery ([place]) 06/30/2020  ? Age-related nuclear cataract of both eyes 09/28/2015  ? Arthralgia of multiple joints 09/28/2015  ? Asthma   ? Borderline diabetes   ? Cerebrovascular accident (CVA) ([place])   ? Chronic bilateral thoracic back pain 06/05/2018  ? Chronic renal insufficiency, stage 1 08/11/2014  ? Diabetes mellitus type 2 in obese [facility])   ? Dyslipidemia   ? Dysphagia, post-stroke   ? Erosive gastritis 07/20/2019  ? Formatting of this note might be different from the original. On EGD 07/2019  ? Esophagitis 07/20/2019  ? Formatting of this note might be different from the original. Added automatically from request for surgery [id number]  Formatting of this note might be different from the original. On EGD 07/2019  ? Essential hypertension 09/28/2015  ? Essential thrombocytosis ([place]) 09/28/2015  ? Gallstones 08/18/2019  ? Formatting of this note might be different from the original. Added automatically from request for surgery [id number]  ? Gastroesophageal reflux disease without esophagitis 09/28/2015  ? 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 Hyperlipidemia, unspecified 03/16/2013  ? Hypertension   ? Keratoconjunctivitis sicca of both eyes not specified as Sjogren's 09/28/2015  ? Left middle cerebral artery stroke ([place]) 07/09/2020  ? Morbid obesity ([place]) 05/21/2013  ? Seizures ([place])   ? Stage 3b chronic kidney disease ([place])   ? Status post gastric bypass for obesity 09/28/2015  ? ?Past Surgical History:  ?Procedure Laterality Date  ? ANKLE SURGERY    ? CARPAL TUNNEL RELEASE    ? carpel tunnel    ? IR ANGIO INTRA EXTRACRAN SEL COM CAROTID INNOMINATE BILAT MOD SED  01/17/2021  ? IR ANGIO VERTEBRAL SEL SUBCLAVIAN INNOMINATE UNI R MOD SED  01/17/2021  ? IR CT HEAD LTD  06/30/2020  ? IR INTRA CRAN STENT  06/30/2020  ? IR PERCUTANEOUS ART THROMBECTOMY/INFUSION INTRACRANIAL INC DIAG ANGIO  06/30/2020  ? IR RADIOLOGIST EVAL & MGMT  08/26/2020  ? IR US GUIDE VASC ACCESS RIGHT  01/17/2021  ? RADIOLOGY WITH ANESTHESIA N/A 06/30/2020  ? Procedure: RADIOLOGY WITH ANESTHESIA;  Surgeon: Radiologist, Medication, MD;  Location: [facility];  Service: Radiology;  Laterality: N/A;  ? ? ?Current Outpatient Medications  ?Medication Sig Dispense Refill  ? acetaminophen (TYLENOL) 500 MG tablet Take 1,000 mg by mouth every 6 (six) hours as needed for moderate pain or headache.    ? amitriptyline (ELAVIL) 25 MG tablet TAKE 1 TABLET BY MOUTH EVERYDAY AT BEDTIME 90 tablet 1  ? amLODipine (NORVASC) 10 MG tablet Take by mouth.    ? apixaban (ELIQUIS) 5 MG TABS tablet TAKE 1 TABLET BY MOUTH TWICE A DAY 60 tablet 5  ? aspirin  EC 81 MG tablet Take 1 tablet (81 mg total) by mouth daily. Swallow whole.    ? atorvastatin (LIPITOR) 40 MG tablet Take 1 tablet (40 mg total) by mouth at bedtime. 30 tablet 0  ? KLOR-CON M20 20 MEQ tablet Take 20 mEq by mouth daily.    ? levETIRAcetam (KEPPRA) 750 MG tablet Take 2 tablets (1,500 mg total) by mouth 2 (two) times daily. 120 tablet 5  ? Multiple Vitamin (MULTIVITAMIN WITH MINERALS) TABS tablet Take 1 tablet by mouth daily.    ?  omeprazole (PRILOSEC) 40 MG capsule Take 1 capsule by mouth daily.    ? ondansetron (ZOFRAN-ODT) 4 MG disintegrating tablet Take 1 tablet (4 mg total) by mouth every 8 (eight) hours as needed for up to 10 doses for nausea or vomiting. 20 tablet 0  ? predniSONE (DELTASONE) 50 MG tablet Pre medication for contrast allergy. Take 50 mg Prednisone at 7:30 pm on 8/8, at 1:30 am on 8/9; and 7:30 am on 8/9 (with Benadryl 50 mg at 7:30 am.)    ? Pseudoephedrine-APAP-DM (DAYQUIL PO) Take 1 capsule by mouth daily as needed (coldy symptoms).    ? sertraline (ZOLOFT) 50 MG tablet Take 50 mg by mouth daily.    ? ursodiol (ACTIGALL) 250 MG tablet Take 250 mg by mouth 2 (two) times daily.    ? valsartan (DIOVAN) 160 MG tablet Take by mouth.    ? ?No current facility-administered medications for this visit.  ? ? ?Allergies:   Homatropine, Iodinated contrast media, Doxycycline, Sulfa antibiotics, Codeine, Hydrocodone, Hydrocodone bit-homatrop mbr, and Ace inhibitors  ? ?Social History: ?Social History  ? ?Socioeconomic History  ? Marital status: Divorced  ?  Spouse name: Not on file  ? Number of children: Not on file  ? Years of education: Not on file  ? Highest education level: Not on file  ?Occupational History  ? Not on file  ?Tobacco Use  ? Smoking status: Never  ? Smokeless tobacco: Never  ?Vaping Use  ? Vaping Use: Never used  ?Substance and Sexual Activity  ? Alcohol use: No  ? Drug use: No  ? Sexual activity: Not on file  ?Other Topics Concern  ? Not on file  ?Social History Narrative  ? Lives with daughter [name]  ? Right Handed  ? Drinks no caffeine  ? 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 ?Social Determinants of Health  ? ?Financial Resource Strain: Low Risk   ? Difficulty of Paying Living Expenses: Not hard at all  ?Food Insecurity: No Food Insecurity  ? Worried About Charity fundraiser in the Last Year: Never true  ? Ran Out of Food in the Last Year: Never true   ?Transportation Needs: No Transportation Needs  ? Lack of Transportation (Medical): No  ? Lack of Transportation (Non-Medical): No  ?Physical Activity: Not on file  ?Stress: No Stress Concern Present  ? Feeling of Stress : Only a little  ?Social Connections: Moderately Integrated  ? Frequency of Communication with Friends and Family: More than three times a week  ? Frequency of Social Gatherings with Friends and Family: More than three times a week  ? Attends Religious Services: 1 to 4 times per year  ? Active Member of Clubs or Organizations: Yes  ? Attends Archivist Meetings: 1 to 4 times per year  ? Marital Status: Divorced  ?Intimate Partner Violence: Not At Risk  ?  Fear of Current or Ex-Partner: No  ? Emotionally Abused: No  ? Physically Abused: No  ? Sexually Abused: No  ? ? ?Family History: ?Family History  ?Problem Relation Age of Onset  ? Stroke Maternal Grandfather   ? ? ? ?Review of Systems: ?All other systems reviewed and are otherwise negative except as noted above. ? ?Physical Exam: ?There were no vitals filed for this visit.  ? ?GEN- The patient is well appearing, alert and oriented x 3 today.   ?HEENT: normocephalic, atraumatic; sclera clear, conjunctiva pink; hearing intact; oropharynx clear; neck supple  ?Lungs- Clear to ausculation bilaterally, normal work of breathing.  No wheezes, rales, rhonchi ?Heart- Regular rate and rhythm, no murmurs, rubs or gallops  ?GI- soft, non-tender, non-distended, bowel sounds present  ?Extremities- no clubbing, cyanosis, or edema  ?MS- no significant deformity or atrophy ?Skin- warm and dry, no rash or lesion; PPM pocket well healed ?Psych- euthymic mood, full affect ?Neuro- strength and sensation are intact ? ?PPM Interrogation- reviewed in detail today,  See PACEART report ? ?EKG:  EKG is not ordered today. ? ?Recent Labs: ?05/10/2021: Magnesium 1.7 ?05/16/2021: TSH 2.298 ?06/01/2021: Hemoglobin 10.8; Platelets 302 ?08/28/2021: ALT 12; BUN 20;  Creatinine, Ser 1.53; Potassium 4.9; Sodium 144  ? ?Wt Readings from Last 3 Encounters:  ?08/28/21 145 lb (65.8 kg)  ?08/24/21 148 lb 12.8 oz (67.5 kg)  ?07/14/21 147 lb 3.2 oz (66.8 kg)  ?  ? ?Other studies [place]

## 2021-09-28 ENCOUNTER — Ambulatory Visit (INDEPENDENT_AMBULATORY_CARE_PROVIDER_SITE_OTHER): Payer: Medicare Other | Admitting: Student

## 2021-09-28 ENCOUNTER — Encounter: Payer: Self-pay | Admitting: Student

## 2021-09-28 VITALS — BP 128/68 | HR 81 | Ht 62.0 in | Wt 149.2 lb

## 2021-09-28 DIAGNOSIS — Z4509 Encounter for adjustment and management of other cardiac device: Secondary | ICD-10-CM

## 2021-09-28 DIAGNOSIS — R569 Unspecified convulsions: Secondary | ICD-10-CM | POA: Diagnosis not present

## 2021-09-28 DIAGNOSIS — I480 Paroxysmal atrial fibrillation: Secondary | ICD-10-CM

## 2021-09-28 NOTE — Patient Instructions (Signed)
Medication Instructions:  ?Your physician recommends that you continue on your current medications as directed. Please refer to the Current Medication list given to you today. ? ?*If you need a refill on your cardiac medications before your next appointment, please call your pharmacy* ? ? ?Lab Work: ?None  ?If you have labs (blood work) drawn today and your tests are completely normal, you will receive your results only by: ?MyChart Message (if you have MyChart) OR ?A paper copy in the mail ?If you have any lab test that is abnormal or we need to change your treatment, we will call you to review the results. ? ?Follow-Up: ?At [facility], you and your health needs are our priority.  As part of our continuing mission to provide you with exceptional heart care, we have created designated Provider Care Teams.  These Care Teams include your primary Cardiologist (physician) and Advanced Practice Providers (APPs -  Physician Assistants and Nurse Practitioners) who all work together to provide you with the care you need, when you need it. ? ? ?Your next appointment:   ?6 month(s) ? ?The format for your next appointment:   ?In Person ? ?Provider:   ?Allegra Lai, MD  ? ? ?Important Information About Sugar ? ? ? ? ?  ?

## 2021-10-10 NOTE — Progress Notes (Signed)
Carelink Summary Report / Loop Recorder 

## 2021-10-26 ENCOUNTER — Ambulatory Visit (INDEPENDENT_AMBULATORY_CARE_PROVIDER_SITE_OTHER): Payer: Medicare Other

## 2021-10-26 DIAGNOSIS — I480 Paroxysmal atrial fibrillation: Secondary | ICD-10-CM | POA: Diagnosis not present

## 2021-10-26 LAB — CUP PACEART REMOTE DEVICE CHECK
Date Time Interrogation Session: 20230525084426
Implantable Pulse Generator Implant Date: 20220407

## 2021-11-01 ENCOUNTER — Ambulatory Visit: Payer: Medicare Other | Admitting: Adult Health

## 2021-11-02 ENCOUNTER — Telehealth (HOSPITAL_COMMUNITY): Payer: Self-pay

## 2021-11-02 NOTE — Telephone Encounter (Signed)
Called to schedule f/u mri/mra, no answer, left vm. AW

## 2021-11-06 NOTE — Progress Notes (Signed)
Carelink Summary Report / Loop Recorder 

## 2021-11-08 ENCOUNTER — Inpatient Hospital Stay (HOSPITAL_COMMUNITY)
Admission: EM | Admit: 2021-11-08 | Discharge: 2021-11-15 | DRG: 683 | Disposition: A | Discharge status: Home Health | Payer: Medicare Other | Attending: Student | Admitting: Student

## 2021-11-08 ENCOUNTER — Emergency Department (HOSPITAL_COMMUNITY): Payer: Medicare Other

## 2021-11-08 ENCOUNTER — Other Ambulatory Visit: Payer: Self-pay

## 2021-11-08 DIAGNOSIS — E785 Hyperlipidemia, unspecified: Secondary | ICD-10-CM | POA: Diagnosis present

## 2021-11-08 DIAGNOSIS — E860 Dehydration: Secondary | ICD-10-CM | POA: Diagnosis present

## 2021-11-08 DIAGNOSIS — Z8711 Personal history of peptic ulcer disease: Secondary | ICD-10-CM

## 2021-11-08 DIAGNOSIS — D649 Anemia, unspecified: Secondary | ICD-10-CM

## 2021-11-08 DIAGNOSIS — Z934 Other artificial openings of gastrointestinal tract status: Secondary | ICD-10-CM

## 2021-11-08 DIAGNOSIS — Z888 Allergy status to other drugs, medicaments and biological substances status: Secondary | ICD-10-CM

## 2021-11-08 DIAGNOSIS — Z823 Family history of stroke: Secondary | ICD-10-CM

## 2021-11-08 DIAGNOSIS — E871 Hypo-osmolality and hyponatremia: Secondary | ICD-10-CM

## 2021-11-08 DIAGNOSIS — G40909 Epilepsy, unspecified, not intractable, without status epilepticus: Secondary | ICD-10-CM

## 2021-11-08 DIAGNOSIS — Z903 Acquired absence of stomach [part of]: Secondary | ICD-10-CM

## 2021-11-08 DIAGNOSIS — I69320 Aphasia following cerebral infarction: Secondary | ICD-10-CM

## 2021-11-08 DIAGNOSIS — R112 Nausea with vomiting, unspecified: Secondary | ICD-10-CM | POA: Diagnosis present

## 2021-11-08 DIAGNOSIS — H2513 Age-related nuclear cataract, bilateral: Secondary | ICD-10-CM | POA: Diagnosis present

## 2021-11-08 DIAGNOSIS — E876 Hypokalemia: Secondary | ICD-10-CM

## 2021-11-08 DIAGNOSIS — Z8673 Personal history of transient ischemic attack (TIA), and cerebral infarction without residual deficits: Secondary | ICD-10-CM

## 2021-11-08 DIAGNOSIS — K219 Gastro-esophageal reflux disease without esophagitis: Secondary | ICD-10-CM | POA: Diagnosis present

## 2021-11-08 DIAGNOSIS — E1122 Type 2 diabetes mellitus with diabetic chronic kidney disease: Secondary | ICD-10-CM | POA: Diagnosis present

## 2021-11-08 DIAGNOSIS — Z79899 Other long term (current) drug therapy: Secondary | ICD-10-CM

## 2021-11-08 DIAGNOSIS — K2970 Gastritis, unspecified, without bleeding: Secondary | ICD-10-CM | POA: Diagnosis present

## 2021-11-08 DIAGNOSIS — F32A Depression, unspecified: Secondary | ICD-10-CM | POA: Diagnosis present

## 2021-11-08 DIAGNOSIS — I129 Hypertensive chronic kidney disease with stage 1 through stage 4 chronic kidney disease, or unspecified chronic kidney disease: Secondary | ICD-10-CM | POA: Diagnosis present

## 2021-11-08 DIAGNOSIS — Z7901 Long term (current) use of anticoagulants: Secondary | ICD-10-CM

## 2021-11-08 DIAGNOSIS — I10 Essential (primary) hypertension: Secondary | ICD-10-CM | POA: Diagnosis present

## 2021-11-08 DIAGNOSIS — K2960 Other gastritis without bleeding: Secondary | ICD-10-CM | POA: Diagnosis present

## 2021-11-08 DIAGNOSIS — Z7982 Long term (current) use of aspirin: Secondary | ICD-10-CM

## 2021-11-08 DIAGNOSIS — N179 Acute kidney failure, unspecified: Principal | ICD-10-CM | POA: Diagnosis present

## 2021-11-08 DIAGNOSIS — N1832 Chronic kidney disease, stage 3b: Secondary | ICD-10-CM | POA: Diagnosis present

## 2021-11-08 DIAGNOSIS — F419 Anxiety disorder, unspecified: Secondary | ICD-10-CM | POA: Diagnosis present

## 2021-11-08 DIAGNOSIS — Z91041 Radiographic dye allergy status: Secondary | ICD-10-CM

## 2021-11-08 DIAGNOSIS — E872 Acidosis, unspecified: Secondary | ICD-10-CM

## 2021-11-08 DIAGNOSIS — K2100 Gastro-esophageal reflux disease with esophagitis, without bleeding: Secondary | ICD-10-CM | POA: Diagnosis present

## 2021-11-08 DIAGNOSIS — I480 Paroxysmal atrial fibrillation: Secondary | ICD-10-CM | POA: Diagnosis present

## 2021-11-08 DIAGNOSIS — Z881 Allergy status to other antibiotic agents status: Secondary | ICD-10-CM

## 2021-11-08 DIAGNOSIS — Z882 Allergy status to sulfonamides status: Secondary | ICD-10-CM

## 2021-11-08 DIAGNOSIS — J45909 Unspecified asthma, uncomplicated: Secondary | ICD-10-CM | POA: Diagnosis present

## 2021-11-08 DIAGNOSIS — Z885 Allergy status to narcotic agent status: Secondary | ICD-10-CM

## 2021-11-08 DIAGNOSIS — I69391 Dysphagia following cerebral infarction: Secondary | ICD-10-CM

## 2021-11-08 DIAGNOSIS — Z9884 Bariatric surgery status: Secondary | ICD-10-CM

## 2021-11-08 DIAGNOSIS — K209 Esophagitis, unspecified without bleeding: Secondary | ICD-10-CM | POA: Diagnosis present

## 2021-11-08 LAB — CBC WITH DIFFERENTIAL/PLATELET
Abs Immature Granulocytes: 0.02 10*3/uL (ref 0.00–0.07)
Basophils Absolute: 0 10*3/uL (ref 0.0–0.1)
Basophils Relative: 1 %
Eosinophils Absolute: 0 10*3/uL (ref 0.0–0.5)
Eosinophils Relative: 0 %
HCT: 44.7 % (ref 36.0–46.0)
Hemoglobin: 14.9 g/dL (ref 12.0–15.0)
Immature Granulocytes: 0 %
Lymphocytes Relative: 33 %
Lymphs Abs: 2.2 10*3/uL (ref 0.7–4.0)
MCH: 28.1 pg (ref 26.0–34.0)
MCHC: 33.3 g/dL (ref 30.0–36.0)
MCV: 84.3 fL (ref 80.0–100.0)
Monocytes Absolute: 0.6 10*3/uL (ref 0.1–1.0)
Monocytes Relative: 8 %
Neutro Abs: 3.9 10*3/uL (ref 1.7–7.7)
Neutrophils Relative %: 58 %
Platelets: 609 10*3/uL — ABNORMAL HIGH (ref 150–400)
RBC: 5.3 MIL/uL — ABNORMAL HIGH (ref 3.87–5.11)
RDW: 12.9 % (ref 11.5–15.5)
WBC: 6.8 10*3/uL (ref 4.0–10.5)
nRBC: 0 % (ref 0.0–0.2)

## 2021-11-08 LAB — URINALYSIS, ROUTINE W REFLEX MICROSCOPIC
Bilirubin Urine: NEGATIVE
Glucose, UA: NEGATIVE mg/dL
Ketones, ur: 5 mg/dL — AB
Nitrite: NEGATIVE
Protein, ur: NEGATIVE mg/dL
Specific Gravity, Urine: 1.013 (ref 1.005–1.030)
pH: 5 (ref 5.0–8.0)

## 2021-11-08 LAB — COMPREHENSIVE METABOLIC PANEL
ALT: 15 U/L (ref 0–44)
AST: 26 U/L (ref 15–41)
Albumin: 4.1 g/dL (ref 3.5–5.0)
Alkaline Phosphatase: 165 U/L — ABNORMAL HIGH (ref 38–126)
Anion gap: 11 (ref 5–15)
BUN: 37 mg/dL — ABNORMAL HIGH (ref 8–23)
CO2: 15 mmol/L — ABNORMAL LOW (ref 22–32)
Calcium: 9.9 mg/dL (ref 8.9–10.3)
Chloride: 109 mmol/L (ref 98–111)
Creatinine, Ser: 2.22 mg/dL — ABNORMAL HIGH (ref 0.44–1.00)
GFR, Estimated: 24 mL/min — ABNORMAL LOW (ref >60)
Glucose, Bld: 150 mg/dL — ABNORMAL HIGH (ref 70–99)
Potassium: 4.9 mmol/L (ref 3.5–5.1)
Sodium: 135 mmol/L (ref 135–145)
Total Bilirubin: 1 mg/dL (ref 0.3–1.2)
Total Protein: 7.6 g/dL (ref 6.5–8.1)

## 2021-11-08 LAB — LIPASE, BLOOD: Lipase: 31 U/L (ref 11–51)

## 2021-11-08 LAB — LACTIC ACID, PLASMA: Lactic Acid, Venous: 1.2 mmol/L (ref 0.5–1.9)

## 2021-11-08 IMAGING — CT CT ABD-PELV W/O CM
2 of 4 series · 16 of 46 positions shown, 18 images · non-contrast
Comparison: [DATE]

CLINICAL DATA: Abdominal pain, nausea



[Series 3: ap without · axial · non-contrast · 0.73mm/px · z∈[+802,+1227]mm · 13 of 95 slices shown, 15 images]
[im 5/95  soft-tissue]
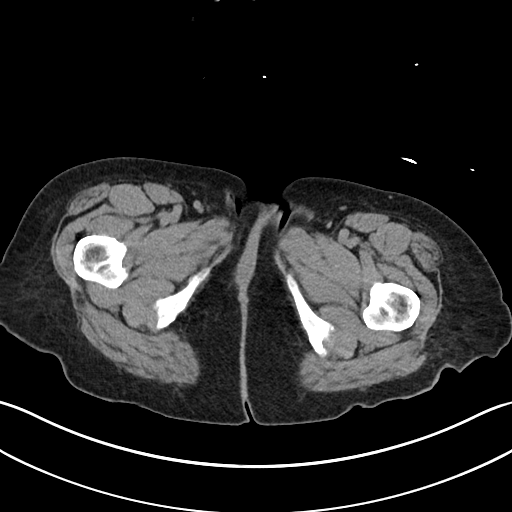
[im 5/95  bone]
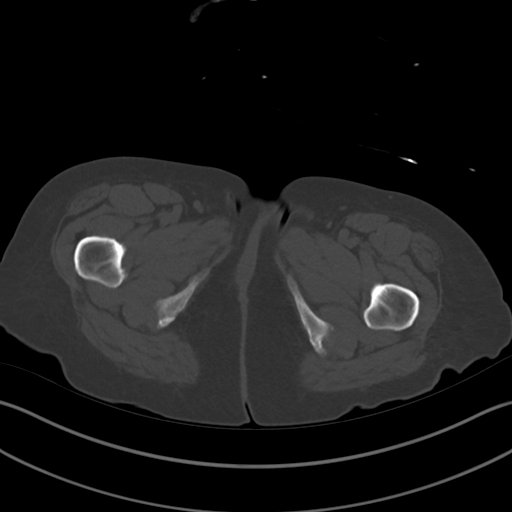
[im 15/95  soft-tissue]
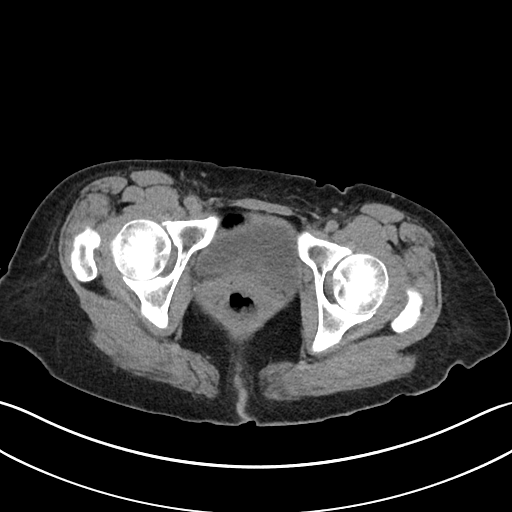
[im 20/95  soft-tissue]
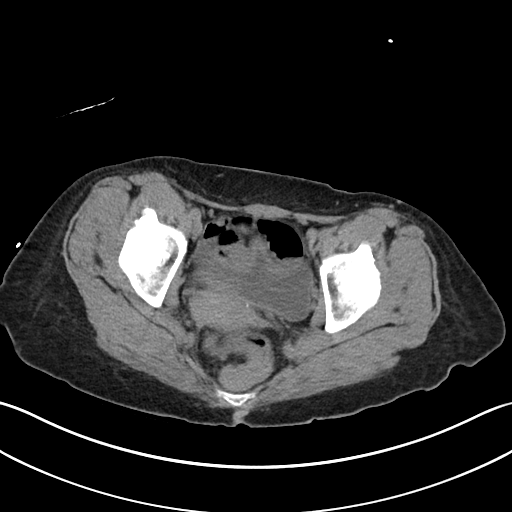
[im 25/95  soft-tissue]
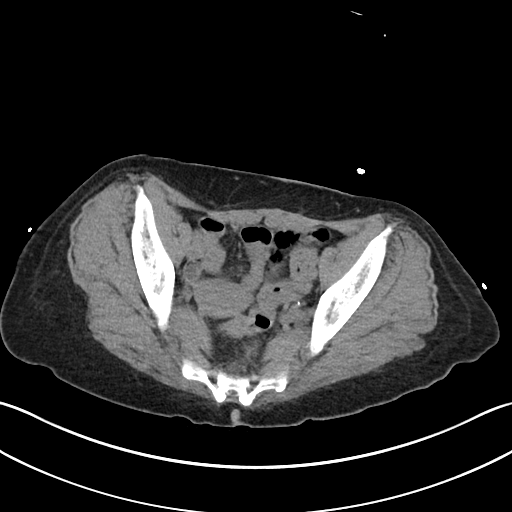
[im 35/95  soft-tissue]
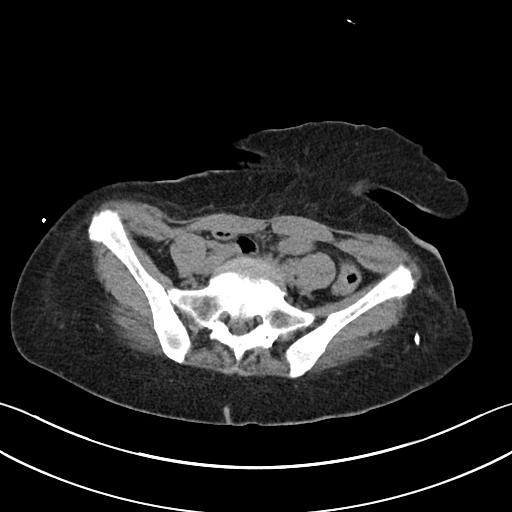
[im 40/95  soft-tissue]
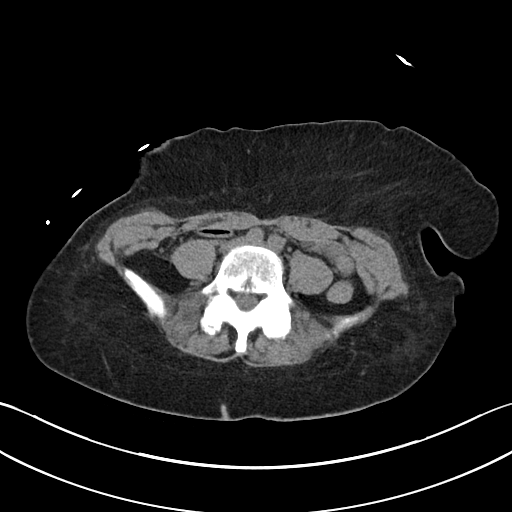
[im 50/95  soft-tissue]
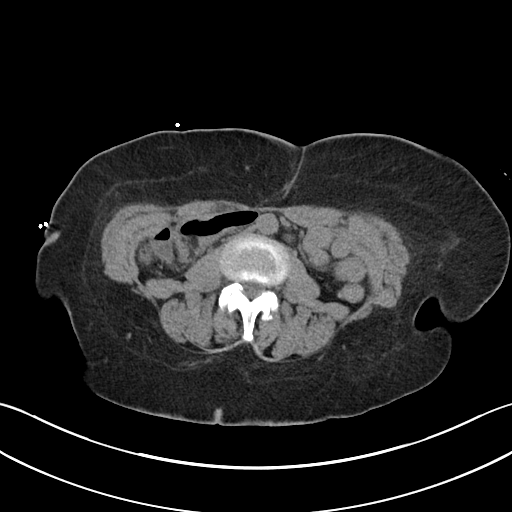
[im 55/95  soft-tissue]
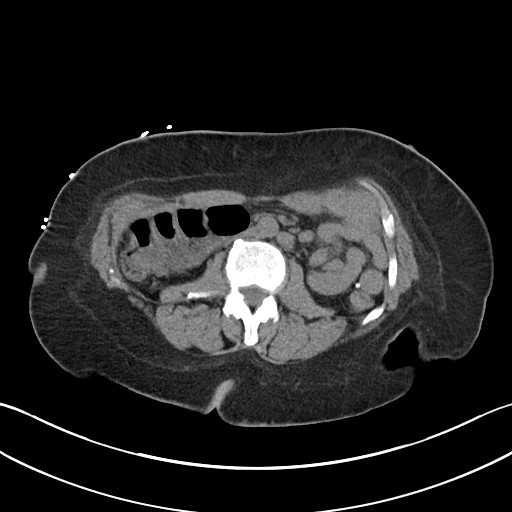
[im 60/95  soft-tissue]
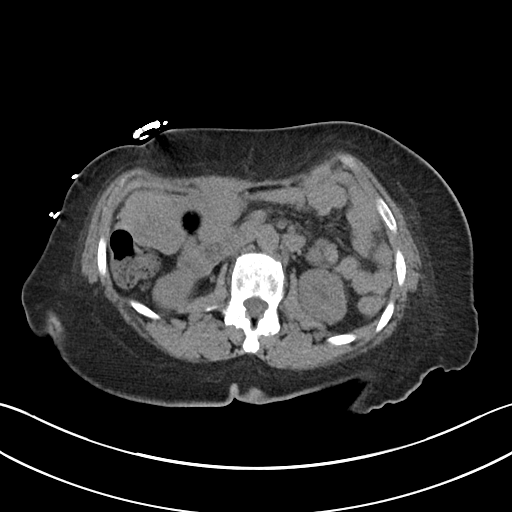
[im 60/95  bone]
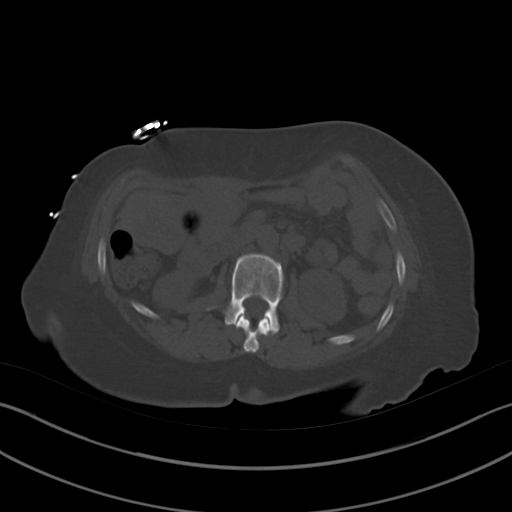
[im 70/95  soft-tissue]
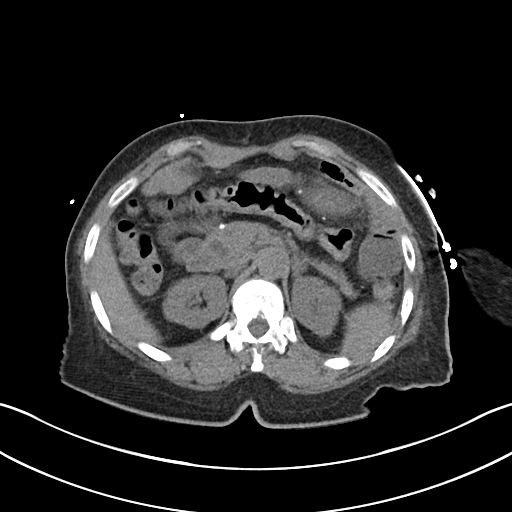
[im 75/95  soft-tissue]
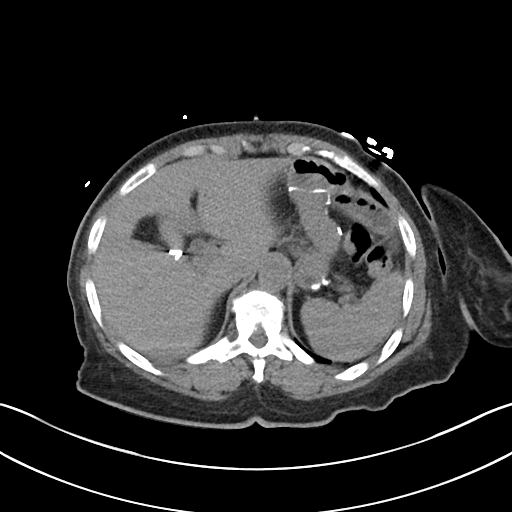
[im 80/95  soft-tissue]
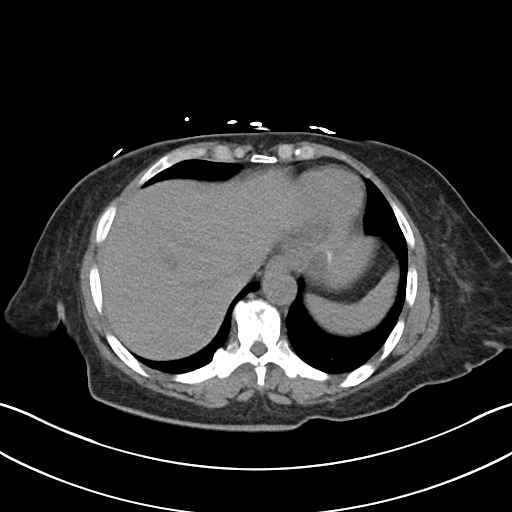
[im 90/95  soft-tissue]
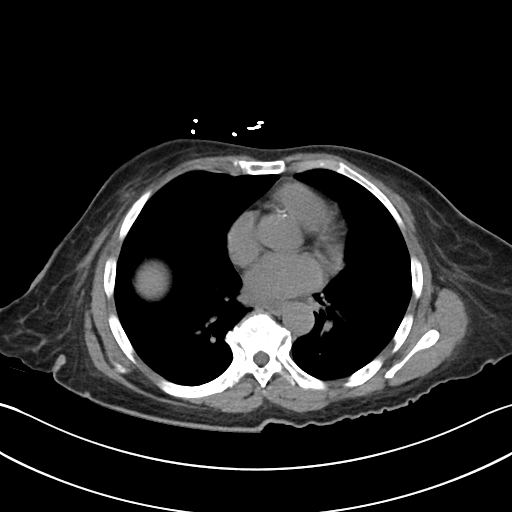

[Series 6: cor · coronal · 0.72mm/px · 3 of 81 slices shown]
[im 27/81  soft-tissue]
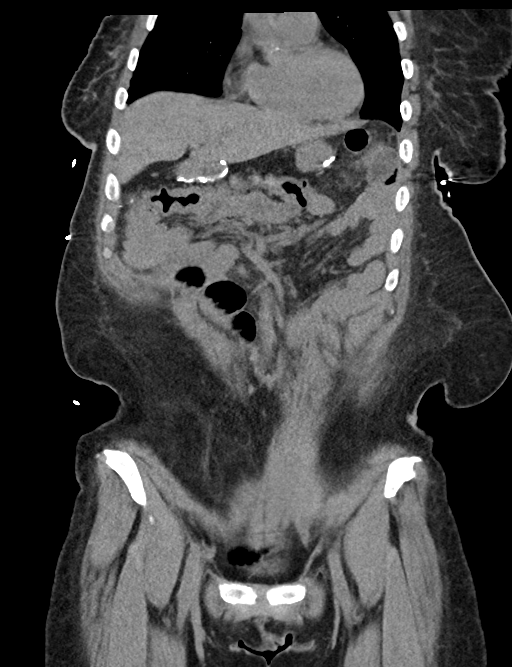
[im 36/81  soft-tissue]
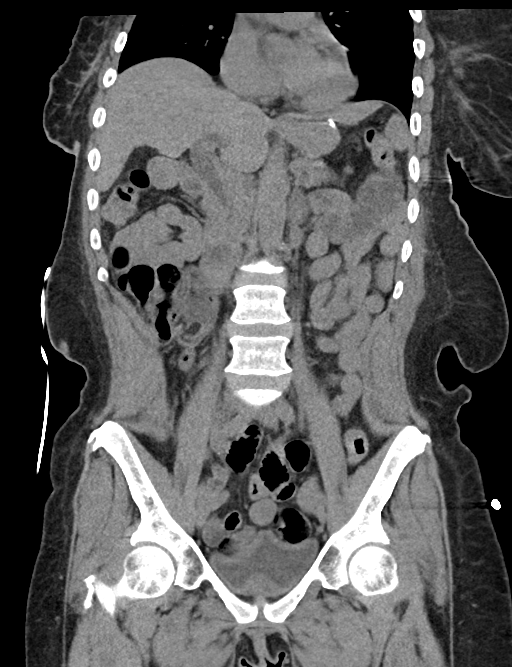
[im 45/81  soft-tissue]
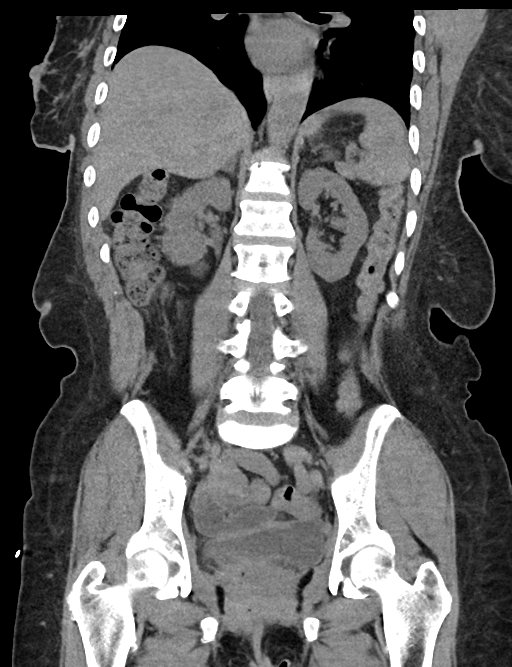

[16 of 46 positions shown; findings below may reference images not displayed]

FINDINGS: Lower chest: No pleural or pericardial effusion. Improved aeration
in the visualized lung bases.

Hepatobiliary: No focal liver abnormality is seen. Status post
cholecystectomy. No biliary dilatation.

Pancreas: Unremarkable. No pancreatic ductal dilatation or
surrounding inflammatory changes.

Spleen: Normal in size without focal abnormality.

Adrenals/Urinary Tract: No adrenal mass. Symmetric renal contours
with no urolithiasis or hydronephrosis. Urinary bladder incompletely
distended.

Stomach/Bowel: Changes of partial gastrectomy and gastrojejunostomy.
Small bowel is nondilated. Appendix not discretely identified. The
colon is incompletely distended, unremarkable.

Vascular/Lymphatic: Mild scattered aortoiliac calcified atheromatous
plaque without aneurysm. No abdominal or pelvic adenopathy.

Reproductive: Uterus and bilateral adnexa are unremarkable.

Other: No ascites.  Bilateral pelvic phleboliths.  No free air.

Musculoskeletal: No acute or significant osseous findings.
IMPRESSION: 1. No acute findings.
2. Post partial gastrectomy
3.  Aortic Atherosclerosis ([CP]-170.0).

## 2021-11-08 MED ORDER — AMITRIPTYLINE HCL 25 MG PO TABS
25.0000 mg | ORAL_TABLET | Freq: Every day | ORAL | Status: DC
  Administered 2021-11-08 – 2021-11-14 (×6): 25 mg via ORAL
  Filled 2021-11-08 (×7): qty 1

## 2021-11-08 MED ORDER — ATORVASTATIN CALCIUM 80 MG PO TABS
80.0000 mg | ORAL_TABLET | Freq: Every day | ORAL | Status: DC
  Administered 2021-11-09 – 2021-11-15 (×7): 80 mg via ORAL
  Filled 2021-11-08 (×7): qty 1

## 2021-11-08 MED ORDER — METOCLOPRAMIDE HCL 5 MG/ML IJ SOLN
10.0000 mg | Freq: Once | INTRAMUSCULAR | Status: AC
  Administered 2021-11-08: 10 mg via INTRAVENOUS
  Filled 2021-11-08: qty 2

## 2021-11-08 MED ORDER — MORPHINE SULFATE (PF) 4 MG/ML IV SOLN
4.0000 mg | Freq: Once | INTRAVENOUS | Status: AC
  Administered 2021-11-08: 4 mg via INTRAVENOUS
  Filled 2021-11-08: qty 1

## 2021-11-08 MED ORDER — ONDANSETRON HCL 4 MG PO TABS
4.0000 mg | ORAL_TABLET | Freq: Four times a day (QID) | ORAL | Status: DC | PRN
  Administered 2021-11-12: 4 mg via ORAL
  Filled 2021-11-08: qty 1

## 2021-11-08 MED ORDER — ONDANSETRON HCL 4 MG/2ML IJ SOLN
4.0000 mg | Freq: Four times a day (QID) | INTRAMUSCULAR | Status: DC | PRN
  Administered 2021-11-09 – 2021-11-11 (×2): 4 mg via INTRAVENOUS
  Filled 2021-11-08 (×2): qty 2

## 2021-11-08 MED ORDER — LEVETIRACETAM 500 MG PO TABS
1500.0000 mg | ORAL_TABLET | Freq: Two times a day (BID) | ORAL | Status: DC
  Administered 2021-11-08 – 2021-11-15 (×13): 1500 mg via ORAL
  Filled 2021-11-08 (×14): qty 3

## 2021-11-08 MED ORDER — DIPHENHYDRAMINE HCL 50 MG/ML IJ SOLN
25.0000 mg | Freq: Once | INTRAMUSCULAR | Status: AC
Start: 2021-11-08 — End: 2021-11-08
  Administered 2021-11-08: 25 mg via INTRAVENOUS
  Filled 2021-11-08: qty 1

## 2021-11-08 MED ORDER — APIXABAN 5 MG PO TABS
5.0000 mg | ORAL_TABLET | Freq: Two times a day (BID) | ORAL | Status: DC
  Administered 2021-11-08 – 2021-11-12 (×7): 5 mg via ORAL
  Filled 2021-11-08 (×8): qty 1

## 2021-11-08 MED ORDER — ACETAMINOPHEN 650 MG RE SUPP: 650.0000 mg | Freq: Four times a day (QID) | RECTAL | Status: DC | PRN

## 2021-11-08 MED ORDER — SERTRALINE HCL 50 MG PO TABS
50.0000 mg | ORAL_TABLET | Freq: Every day | ORAL | Status: DC
  Administered 2021-11-09 – 2021-11-15 (×7): 50 mg via ORAL
  Filled 2021-11-08 (×7): qty 1

## 2021-11-08 MED ORDER — ACETAMINOPHEN 325 MG PO TABS: 650.0000 mg | ORAL_TABLET | Freq: Four times a day (QID) | ORAL | Status: DC | PRN

## 2021-11-08 MED ORDER — ASPIRIN 81 MG PO TBEC
81.0000 mg | DELAYED_RELEASE_TABLET | Freq: Every day | ORAL | Status: DC
  Administered 2021-11-09 – 2021-11-15 (×7): 81 mg via ORAL
  Filled 2021-11-08 (×7): qty 1

## 2021-11-08 MED ORDER — ONDANSETRON 4 MG PO TBDP: 8.0000 mg | ORAL_TABLET | Freq: Once | ORAL | Status: DC

## 2021-11-08 MED ORDER — SODIUM CHLORIDE 0.9 % IV BOLUS
1000.0000 mL | Freq: Once | INTRAVENOUS | Status: AC
  Administered 2021-11-08: 1000 mL via INTRAVENOUS

## 2021-11-08 MED ORDER — METOCLOPRAMIDE HCL 5 MG/ML IJ SOLN
10.0000 mg | Freq: Three times a day (TID) | INTRAMUSCULAR | Status: DC
  Administered 2021-11-08 – 2021-11-12 (×11): 10 mg via INTRAVENOUS
  Filled 2021-11-08 (×12): qty 2

## 2021-11-08 MED ORDER — LACTATED RINGERS IV SOLN: INTRAVENOUS | Status: AC

## 2021-11-08 MED ORDER — SODIUM CHLORIDE 0.9% FLUSH
3.0000 mL | Freq: Two times a day (BID) | INTRAVENOUS | Status: DC
  Administered 2021-11-08 – 2021-11-15 (×7): 3 mL via INTRAVENOUS

## 2021-11-08 NOTE — Assessment & Plan Note (Signed)
Continue home Keppra 1500 mg BID.

## 2021-11-08 NOTE — ED Provider Triage Note (Signed)
Emergency Medicine Provider Triage Evaluation Note  Marketa Midkiff , a 66 y.o. female  was evaluated in triage.  Pt complains of nausea and vomiting x6 days.  Has not been able to tolerate anything by mouth, she is having diffuse abdominal pain.  Feels nauseated, seen primary yesterday with no improvement of the nausea and vomiting.  Lab work shows signs of dehydration..  Review of Systems  Per hpi  Physical Exam  BP 100/74 (BP Location: Right Arm)   Pulse (!) 122   Temp 97.7 F (36.5 C) (Oral)   Resp 15   LMP 09/28/2012   SpO2 98%  Gen:   Awake, no distress   Resp:  Normal effort  MSK:   Moves extremities without difficulty  Other:  Tachycardic, abdomen is soft without rigidity or guarding  Medical Decision Making  Medically screening exam initiated at 12:08 PM.  Appropriate orders placed.  [name] was informed that the remainder of the evaluation will be completed by another provider, this initial triage assessment does not replace that evaluation, and the importance of remaining in the ED until their evaluation is complete.     Sherrill Raring, PA-C 11/08/21 1208

## 2021-11-08 NOTE — ED Triage Notes (Signed)
Pt with n/v and abdominal pain since last Thursday. Unable to tolerate PO intake since then, including any of her medications. Went to PCP yesterday who drew blood and gave shot for nausea without any relief. Per patient, blood work showed dehydration.

## 2021-11-08 NOTE — ED Provider Notes (Signed)
  Physical Exam  BP 129/68   Pulse 75   Temp 97.7 F (36.5 C) (Oral)   Resp 18   LMP 09/28/2012   SpO2 99%   Physical Exam Vitals and nursing note reviewed.  Constitutional:      General: She is not in acute distress.    Appearance: She is well-developed.  HENT:     Head: Normocephalic and atraumatic.  Eyes:     Conjunctiva/sclera: Conjunctivae normal.  Cardiovascular:     Rate and Rhythm: Normal rate and regular rhythm.     Heart sounds: No murmur heard. Pulmonary:     Effort: Pulmonary effort is normal. No respiratory distress.     Breath sounds: Normal breath sounds.  Abdominal:     Palpations: Abdomen is soft.     Tenderness: There is no abdominal tenderness.  Musculoskeletal:        General: No swelling.     Cervical back: Neck supple.  Skin:    General: Skin is warm and dry.     Capillary Refill: Capillary refill takes less than 2 seconds.  Neurological:     Mental Status: She is alert.  Psychiatric:        Mood and Affect: Mood normal.    Procedures  Procedures  ED Course / MDM    Medical Decision Making Amount and/or Complexity of Data Reviewed Labs: ordered. Radiology: ordered.  Risk Prescription drug management. Decision regarding hospitalization.   Patient received in handoff.  Inability to tolerate p.o.  Pending CT at time of handoff.  CT unremarkable.  Patient with AKI and inability to tolerate p.o.  Patient then admitted.       Teressa Lower, MD 11/08/21 2252

## 2021-11-08 NOTE — ED Notes (Signed)
Pt cant urinate at time

## 2021-11-08 NOTE — Assessment & Plan Note (Addendum)
Recent Labs    05/15/21 2129 05/15/21 2137 05/16/21 0230 05/17/21 0429 05/18/21 0251 06/01/21 1225 08/28/21 1316 11/08/21 1214 11/09/21 0305 11/10/21 0336  BUN '14 16 13 16 17 11 20 '$ 37* 35* 26*  CREATININE 1.54* 1.70* 1.39* 1.43* 1.35* 1.44* 1.53* 2.22* 1.90* 1.75*  Likely prerenal in the setting of GI loss.  Improving with IV fluid. -Continue IV fluid hydration -Continue monitoring

## 2021-11-08 NOTE — Assessment & Plan Note (Addendum)
In sinus rhythm.  Not on rate/rhythm control meds. -Continue Eliquis

## 2021-11-08 NOTE — Assessment & Plan Note (Addendum)
Unclear etiology of this.  She has no abdominal pain but mild diffuse tenderness.  She has no leukocytosis, fever or diarrhea.  CT A/P without acute finding.  She has history of sleeve gastrectomy.  Continues to endorse nausea and heaving but no witnessed emesis.  She has epigastric pain and tenderness.. -Continue IV fluid, scheduled Reglan and as needed Zofran. -Increase IV Protonix to 40 mg twice daily -Advance to full liquid diet. -Monitor electrolytes and replenish as appropriate

## 2021-11-08 NOTE — H&P (Signed)
History and Physical    [name] ZRA:[id number] DOB: Nov 27, 1955 DOA: 11/08/2021  PCP: [name], PA-C  Patient coming from: Home  I have personally briefly reviewed patient's old medical records in [place]  Chief Complaint: Nausea and vomiting  HPI: Courtney Burke is a 66 y.o. 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  Patient reports 1 week of persistent nausea and vomiting.  She has not been able to maintain any adequate oral intake or keep down her medications.  She has had intermittent generalized abdominal pain.  She has decreased urine output due to feeling dehydrated.  Denies chest pain, dyspnea, diarrhea, peripheral edema.  Patient states she recently ran out of a few of her medications including her antihypertensives.  ED Course  Labs/Imaging on admission: I have personally reviewed following labs and imaging studies.  Initial vitals showed BP 100/74, pulse 122, RR 15, temp 97.7 F, SPO2 98% on room air.  Labs show creatinine 2.22 (1.53 on 08/28/2021), BUN 37, serum glucose 150, sodium 135, potassium 4.9, bicarb 15, AST 26, ALT 15, alk phos 165, total bilirubin 1.0, lipase 31, WBC 6.8, hemoglobin 14.9, platelets 609,000, lactic acid 1.2.  CT abdomen/pelvis without contrast is negative for acute findings.  Post partial gastrectomy changes noted.  Patient was given 1 L normal saline, IV morphine 4 mg, IV Reglan 10 mg, IV Benadryl 25 mg.  The hospitalist service was consulted to admit for further evaluation and management.  Review of Systems: All systems reviewed and are negative except as documented in history of present illness above.   Past Medical History:  Diagnosis Date   Acute  ischemic left MCA stroke ([place]) 06/30/2020   Acute ischemic stroke (HCC)    Acute stroke due to occlusion of left middle cerebral artery ([place]) 06/30/2020   Age-related nuclear cataract of both eyes 09/28/2015   Arthralgia of multiple joints 09/28/2015   Asthma    Borderline diabetes    Cerebrovascular accident (CVA) ([place])    Chronic bilateral thoracic back pain 06/05/2018   Chronic renal insufficiency, stage 1 08/11/2014   Diabetes mellitus type 2 in obese ([place])    Dyslipidemia    Dysphagia, post-stroke    Erosive gastritis 07/20/2019   Formatting of this note might be different from the original. On EGD 07/2019   Esophagitis 07/20/2019   Formatting of this note might be different from the original. Added automatically from request for surgery [id number]  Formatting of this note might be different from the original. On EGD 07/2019   Essential hypertension 09/28/2015   Essential thrombocytosis ([place]) 09/28/2015   Gallstones 08/18/2019   Formatting of this note might be different from the original. 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/09/2020   Morbid obesity ([place]) 05/21/2013   Seizures ([place])    Stage 3b chronic kidney disease ([place])    Status post gastric bypass for obesity 09/28/2015    Past Surgical History:  Procedure Laterality Date   ANKLE SURGERY     CARPAL TUNNEL RELEASE  carpel tunnel     IR ANGIO INTRA EXTRACRAN SEL COM CAROTID INNOMINATE BILAT MOD SED  01/17/2021   IR ANGIO VERTEBRAL SEL SUBCLAVIAN INNOMINATE UNI R MOD SED  01/17/2021   IR CT HEAD LTD  06/30/2020   IR INTRA CRAN STENT  06/30/2020   IR PERCUTANEOUS ART THROMBECTOMY/INFUSION INTRACRANIAL INC DIAG ANGIO   06/30/2020   IR RADIOLOGIST EVAL & MGMT  08/26/2020   IR US GUIDE VASC ACCESS RIGHT  01/17/2021   RADIOLOGY WITH ANESTHESIA N/A 06/30/2020   Procedure: RADIOLOGY WITH ANESTHESIA;  Surgeon: Radiologist, Medication, MD;  Location: [facility];  Service: Radiology;  Laterality: N/A;    Social History:  reports that she has never smoked. She has never used smokeless tobacco. She reports that she does not drink alcohol and does not use drugs.  Allergies  Allergen Reactions   Homatropine Itching   Iodinated Contrast Media Itching   Doxycycline Nausea And Vomiting   Sulfa Antibiotics Rash and Hives   Codeine Hives and Swelling    Swollen tongue   Hydrocodone Itching   Hydrocodone Bit-Homatrop Mbr Itching   Ace Inhibitors Cough, Itching and Rash    Family History  Problem Relation Age of Onset   Stroke Maternal Grandfather      Prior to Admission medications   Medication Sig Start Date End Date Taking? Authorizing Provider  acetaminophen (TYLENOL) 500 MG tablet Take 1,000 mg by mouth every 6 (six) hours as needed for moderate pain or headache.    [provider]  amitriptyline (ELAVIL) 25 MG tablet TAKE 1 TABLET BY MOUTH EVERYDAY AT BEDTIME 07/24/21   Frann Rider, NP  amLODipine (NORVASC) 10 MG tablet Take by mouth. 04/11/21   [provider]  apixaban (ELIQUIS) 5 MG TABS tablet TAKE 1 TABLET BY MOUTH TWICE A DAY 07/10/21   Camnitz, Ocie Doyne, MD  aspirin EC 81 MG tablet Take 1 tablet (81 mg total) by mouth daily. Swallow whole. 06/29/21   Frann Rider, NP  atorvastatin (LIPITOR) 80 MG tablet Take 80 mg by mouth daily. 09/25/21   [provider]  KLOR-CON M20 20 MEQ tablet Take 20 mEq by mouth daily. 05/16/21   [provider]  levETIRAcetam (KEPPRA) 750 MG tablet Take 2 tablets (1,500 mg total) by mouth 2 (two) times daily. 08/28/21   Frann Rider, NP  Multiple Vitamin (MULTIVITAMIN WITH MINERALS) TABS tablet Take 1 tablet by mouth daily.    [provider]  omeprazole (PRILOSEC) 40 MG capsule Take 1 capsule by mouth daily. 07/28/20   [provider]  ondansetron (ZOFRAN-ODT) 4 MG disintegrating tablet Take 1 tablet (4 mg total) by mouth every 8 (eight) hours as needed for up to 10 doses for nausea or vomiting. 06/01/21   [name], MD  Pseudoephedrine-APAP-DM (DAYQUIL PO) Take 1 capsule by mouth daily as needed (coldy symptoms).    [provider]  sertraline (ZOLOFT) 50 MG tablet Take 50 mg by mouth daily. 08/16/21   [provider]  ursodiol (ACTIGALL) 250 MG tablet Take 250 mg by mouth 2 (two) times daily. 07/28/20   [provider]  valsartan (DIOVAN) 160 MG tablet Take by mouth. 07/06/18   [provider]    Physical Exam: Vitals:   11/08/21 1645 11/08/21 1700 11/08/21 1915 11/08/21 1933  BP: 123/74 (!) 142/77 (!) 152/91 129/68  Pulse: 100 91 97 75  Resp:  18 18   Temp:      TempSrc:  SpO2: 97% 99% 97% 99%   Constitutional: Resting supine in bed, still nauseous but in NAD Eyes: EOMI, lids and conjunctivae normal ENMT: Mucous membranes are dry. Posterior pharynx clear of any exudate or lesions.Normal dentition.  Neck: normal, supple, no masses. Respiratory: clear to auscultation bilaterally, no wheezing, no crackles. Normal respiratory effort. No accessory muscle use.  Cardiovascular: Regular rate and rhythm, no murmurs / rubs / gallops. No extremity edema. 2+ pedal pulses. Abdomen: Mild generalized tenderness, soft abdomen, no masses palpated. No hepatosplenomegaly. Bowel sounds positive.  Musculoskeletal: no clubbing / cyanosis. No joint deformity upper and lower extremities. Good ROM, no contractures. Normal muscle tone.  Skin: no rashes, lesions, ulcers. No induration Neurologic: Sensation intact. Strength 5/5 in all 4.  Psychiatric: Alert and oriented x 3  EKG: Personally reviewed. Sinus tachycardia, rate 120, RAE, no acute ischemic changes.  Rate is faster when  compared to prior.  Assessment/Plan Principal Problem:   Intractable nausea and vomiting Active Problems:   Acute renal failure superimposed on stage 3b chronic kidney disease (HCC)   Paroxysmal atrial fibrillation (HCC)   Essential hypertension   Hyperlipidemia, unspecified   Seizure disorder ([place])   History of CVA (cerebrovascular accident)   Britany Callicott is a 66 y.o. female with medical history significant for paroxysmal atrial fibrillation on Eliquis, history of CVA (s/p left MCA stent 06/2020), HTN, HLD, CKD stage IIIb, seizure disorder, depression/anxiety, hx of sleeve gastrectomy who is admitted with persistent nausea and vomiting.  Assessment and Plan: * Intractable nausea and vomiting Presenting with 1 week persistent nausea and vomiting. CT A/P negative for acute findings, post partial gastrectomy changes noted.  She is dehydrated and has associated AKI on CKD stage IIIb. -Keep n.p.o. except sips/meds as tolerated -Start on scheduled IV Reglan with IV Zofran as needed -Continue IV fluid hydration overnight  Acute renal failure superimposed on stage 3b chronic kidney disease (HCC) Creatinine 2.22 on admission compared to recent baseline 1.3-1.5 in setting of dehydration due to GI losses and poor oral intake. -Continue IV fluid hydration overnight -Monitor urine output, bladder scan as needed  Paroxysmal atrial fibrillation (HCC) In sinus rhythm on admission.  Not on rate/rhythm controlling medications as an outpatient. -Continue Eliquis  History of CVA (cerebrovascular accident) S/p left MCA stent January 2022. -Continue Eliquis, aspirin, atorvastatin  Seizure disorder (HCC) Continue home Keppra 1500 mg BID.  Hyperlipidemia, unspecified Continue atorvastatin.  Essential hypertension BP stable on admission despite not taking home amlodipine and valsartan recently due to running out of her medication.  DVT prophylaxis:  apixaban (ELIQUIS) tablet 5 mg   Code  Status: Full code, confirmed with patient on admission Family Communication: Discussed with patient's daughter and son at bedside Disposition Plan: From home and likely discharged home pending clinical progress Consults called: None Severity of Illness: The appropriate patient status for this patient is OBSERVATION. Observation status is judged to be reasonable and necessary in order to provide the required intensity of service to ensure the patient's safety. The patient's presenting symptoms, physical exam findings, and initial radiographic and laboratory data in the context of their medical condition is felt to place them at decreased risk for further clinical deterioration. Furthermore, it is anticipated that the patient will be medically stable for discharge from the hospital within 2 midnights of admission.   Zada Finders MD [facility]  If 7PM-7AM, please contact night-coverage [email or web address]  11/08/2021, 9:01 PM

## 2021-11-08 NOTE — Assessment & Plan Note (Addendum)
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  S/p left MCA stent January 2022. -Continue Eliquis, aspirin, atorvastatin -PT/OT eval

## 2021-11-08 NOTE — Assessment & Plan Note (Addendum)
Normotensive off home antihypertensive meds.  Reportedly not taking amlodipine and Diovan -Continue monitoring

## 2021-11-08 NOTE — ED Provider Notes (Signed)
[facility] EMERGENCY DEPARTMENT Provider Note   CSN: [id number] Arrival date & time: 11/08/21  1150     History  Chief Complaint  Patient presents with   Abdominal Pain   Emesis    Courtney Burke is a 66 y.o. female.  66 yo F with a chief complaints of epigastric abdominal discomfort.  This been going on for at least a couple weeks.  It sounds like she is not really been able to eat or drink anything.  No fevers or chills.  Has had decreased stool output but was able to have a bowel movement yesterday.  Has a history of abdominal surgery in the past.  Also has a history of a stroke and has some headaches off and on.  Has a headache currently.  Saw her family doctor yesterday.  With ongoing symptoms was encouraged to come to the ED today.   Abdominal Pain Associated symptoms: vomiting   Emesis Associated symptoms: abdominal pain        Home Medications Prior to Admission medications   Medication Sig Start Date End Date Taking? Authorizing Provider  acetaminophen (TYLENOL) 500 MG tablet Take 1,000 mg by mouth every 6 (six) hours as needed for moderate pain or headache.   Yes [provider]  apixaban (ELIQUIS) 5 MG TABS tablet TAKE 1 TABLET BY MOUTH TWICE A DAY Patient taking differently: Take 5 mg by mouth 2 (two) times daily. 07/10/21  Yes Camnitz, Ocie Doyne, MD  aspirin EC 81 MG tablet Take 1 tablet (81 mg total) by mouth daily. Swallow whole. 06/29/21  Yes McCue, Janett Billow, NP  atorvastatin (LIPITOR) 80 MG tablet Take 80 mg by mouth daily. 09/25/21  Yes [provider]  KLOR-CON M20 20 MEQ tablet Take 40 mEq by mouth 2 (two) times daily. 05/16/21  Yes [provider]  levETIRAcetam (KEPPRA) 750 MG tablet Take 2 tablets (1,500 mg total) by mouth 2 (two) times daily. 08/28/21  Yes Frann Rider, NP  Multiple Vitamin (MULTIVITAMIN WITH MINERALS) TABS tablet Take 1 tablet by mouth daily.   Yes [provider]  ondansetron  (ZOFRAN-ODT) 4 MG disintegrating tablet Take 1 tablet (4 mg total) by mouth every 8 (eight) hours as needed for up to 10 doses for nausea or vomiting. 06/01/21  Yes Trifan, Carola Rhine, MD  sertraline (ZOLOFT) 50 MG tablet Take 50 mg by mouth daily. 08/16/21  Yes [provider]  ursodiol (ACTIGALL) 250 MG tablet Take 250 mg by mouth 2 (two) times daily. 07/28/20  Yes [provider]  amitriptyline (ELAVIL) 25 MG tablet TAKE 1 TABLET BY MOUTH EVERYDAY AT BEDTIME Patient not taking: Reported on 11/08/2021 07/24/21   Frann Rider, NP  amLODipine (NORVASC) 10 MG tablet Take by mouth. Patient not taking: Reported on 11/08/2021 04/11/21   [provider]  omeprazole (PRILOSEC) 40 MG capsule Take 1 capsule by mouth daily. Patient not taking: Reported on 11/08/2021 07/28/20   [provider]  Pseudoephedrine-APAP-DM (DAYQUIL PO) Take 1 capsule by mouth daily as needed (coldy symptoms). Patient not taking: Reported on 11/08/2021    [provider]  valsartan (DIOVAN) 160 MG tablet Take by mouth. Patient not taking: Reported on 11/08/2021 07/06/18   [provider]      Allergies    Homatropine, Iodinated contrast media, Doxycycline, Sulfa antibiotics, Codeine, Hydrocodone, Hydrocodone bit-homatrop mbr, and Ace inhibitors    Review of Systems   Review of Systems  Gastrointestinal:  Positive for abdominal pain and vomiting.  Physical Exam Updated Vital Signs BP 102/66   Pulse 91   Temp 98 F (36.7 C) (Oral)   Resp 18   LMP 09/28/2012   SpO2 97%  Physical Exam Vitals and nursing note reviewed.  Constitutional:      General: She is not in acute distress.    Appearance: She is well-developed. She is not diaphoretic.  HENT:     Head: Normocephalic and atraumatic.  Eyes:     Pupils: Pupils are equal, round, and reactive to light.  Cardiovascular:     Rate and Rhythm: Normal rate and regular rhythm.     Heart sounds: No murmur heard.    No friction  rub. No gallop.  Pulmonary:     Effort: Pulmonary effort is normal.     Breath sounds: No wheezing or rales.  Abdominal:     General: There is no distension.     Palpations: Abdomen is soft.     Tenderness: There is no abdominal tenderness.     Comments: Old abdominal scars.  Abdomen is soft no obvious focal area of tenderness.  Musculoskeletal:        General: No tenderness.     Cervical back: Normal range of motion and neck supple.  Skin:    General: Skin is warm and dry.  Neurological:     Mental Status: She is alert and oriented to person, place, and time.     Comments: Some difficulty with speech sounds baseline per family due to a prior stroke.  Psychiatric:        Behavior: Behavior normal.     ED Results / Procedures / Treatments   Labs (all labs ordered are listed, but only abnormal results are displayed) Labs Reviewed  CBC WITH DIFFERENTIAL/PLATELET - Abnormal; Notable for the following components:      Result Value   RBC 5.30 (*)    Platelets 609 (*)    All other components within normal limits  COMPREHENSIVE METABOLIC PANEL - Abnormal; Notable for the following components:   CO2 15 (*)    Glucose, Bld 150 (*)    BUN 37 (*)    Creatinine, Ser 2.22 (*)    Alkaline Phosphatase 165 (*)    GFR, Estimated 24 (*)    All other components within normal limits  URINALYSIS, ROUTINE W REFLEX MICROSCOPIC - Abnormal; Notable for the following components:   APPearance HAZY (*)    Hgb urine dipstick SMALL (*)    Ketones, ur 5 (*)    Leukocytes,Ua TRACE (*)    Bacteria, UA RARE (*)    All other components within normal limits  COMPREHENSIVE METABOLIC PANEL - Abnormal; Notable for the following components:   Sodium 133 (*)    CO2 14 (*)    Glucose, Bld 100 (*)    BUN 35 (*)    Creatinine, Ser 1.90 (*)    Total Protein 6.3 (*)    Albumin 3.3 (*)    AST 44 (*)    Alkaline Phosphatase 138 (*)    GFR, Estimated 29 (*)    All other components within normal limits   LIPASE, BLOOD  LACTIC ACID, PLASMA  MAGNESIUM  HIV ANTIBODY (ROUTINE TESTING W REFLEX)    EKG EKG Interpretation  Date/Time:  Wednesday November 08 2021 12:11:30 EDT Ventricular Rate:  120 PR Interval:  130 QRS Duration: 68 QT Interval:  320 QTC Calculation: 452 R Axis:   35 Text Interpretation: Sinus tachycardia Right atrial enlargement Cannot rule out  Anterior infarct , age undetermined Abnormal ECG Since last tracing rate faster Otherwise no significant change Confirmed by Deno Etienne [phone]) on 11/08/2021 1:08:28 PM  Radiology CT ABDOMEN PELVIS WO CONTRAST  Result Date: 11/08/2021 CLINICAL DATA:  Abdominal pain, nausea EXAM: CT ABDOMEN AND PELVIS WITHOUT CONTRAST TECHNIQUE: Multidetector CT imaging of the abdomen and pelvis was performed following the standard protocol without IV contrast. RADIATION DOSE REDUCTION: This exam was performed according to the departmental dose-optimization program which includes automated exposure control, adjustment of the mA and/or kV according to patient size and/or use of iterative reconstruction technique. COMPARISON:  07/02/2020 FINDINGS: Lower chest: No pleural or pericardial effusion. Improved aeration in the visualized lung bases. Hepatobiliary: No focal liver abnormality is seen. Status post cholecystectomy. No biliary dilatation. Pancreas: Unremarkable. No pancreatic ductal dilatation or surrounding inflammatory changes. Spleen: Normal in size without focal abnormality. Adrenals/Urinary Tract: No adrenal mass. Symmetric renal contours with no urolithiasis or hydronephrosis. Urinary bladder incompletely distended. Stomach/Bowel: Changes of partial gastrectomy and gastrojejunostomy. Small bowel is nondilated. Appendix not discretely identified. The colon is incompletely distended, unremarkable. Vascular/Lymphatic: Mild scattered aortoiliac calcified atheromatous plaque without aneurysm. No abdominal or pelvic adenopathy. Reproductive: Uterus and bilateral  adnexa are unremarkable. Other: No ascites.  Bilateral pelvic phleboliths.  No free air. Musculoskeletal: No acute or significant osseous findings. IMPRESSION: 1. No acute findings. 2. Post partial gastrectomy 3.  Aortic Atherosclerosis (ICD10-170.0). Electronically Signed   By: Lucrezia Europe M.D.   On: 11/08/2021 17:26    Procedures Procedures    Medications Ordered in ED Medications  sodium chloride flush (NS) 0.9 % injection 3 mL (3 mLs Intravenous Given 11/08/21 2213)  lactated ringers infusion (0 mLs Intravenous Stopped 11/09/21 0539)  acetaminophen (TYLENOL) tablet 650 mg (has no administration in time range)    Or  acetaminophen (TYLENOL) suppository 650 mg (has no administration in time range)  ondansetron (ZOFRAN) tablet 4 mg (has no administration in time range)    Or  ondansetron (ZOFRAN) injection 4 mg (has no administration in time range)  metoCLOPramide (REGLAN) injection 10 mg (10 mg Intravenous Given 11/09/21 0552)  apixaban (ELIQUIS) tablet 5 mg (5 mg Oral Given 11/08/21 2208)  aspirin EC tablet 81 mg (has no administration in time range)  atorvastatin (LIPITOR) tablet 80 mg (has no administration in time range)  levETIRAcetam (KEPPRA) tablet 1,500 mg (1,500 mg Oral Given 11/08/21 2207)  sertraline (ZOLOFT) tablet 50 mg (has no administration in time range)  amitriptyline (ELAVIL) tablet 25 mg (25 mg Oral Given 11/08/21 2208)  sodium chloride 0.9 % bolus 1,000 mL (0 mLs Intravenous Stopped 11/08/21 1946)  morphine (PF) 4 MG/ML injection 4 mg (4 mg Intravenous Given 11/08/21 1630)  metoCLOPramide (REGLAN) injection 10 mg (10 mg Intravenous Given 11/08/21 1626)  diphenhydrAMINE (BENADRYL) injection 25 mg (25 mg Intravenous Given 11/08/21 1626)    ED Course/ Medical Decision Making/ A&P                           Medical Decision Making Amount and/or Complexity of Data Reviewed Labs: ordered. Radiology: ordered.  Risk Prescription drug management. Decision regarding  hospitalization.   75 yOF with a chief complaint of epigastric abdominal pain nausea and vomiting.  Going on for almost 2 weeks.  Saw her family doctor yesterday and had blood work obtained.  I reviewed that note and it sounds consistent with my exam today, no focal tenderness she otherwise looks well-appearing.  She is tachycardic  here with soft blood pressures.  Has a metabolic acidosis without anion gap.  No diarrhea.  She has mild worsening renal dysfunction.  We will obtain a CT scan without IV contrast.  Treat pain and nausea.  Bolus of IV fluids.  Add on a lactate.  Reassess.  LA normal, awaiting CT.  Signed out to Dr. Matilde Sprang, please see their note for further details of care in the ED.   The patients results and plan were reviewed and discussed.   Any x-rays performed were independently reviewed by myself.   Differential diagnosis were considered with the presenting HPI.  Medications  sodium chloride flush (NS) 0.9 % injection 3 mL (3 mLs Intravenous Given 11/08/21 2213)  lactated ringers infusion (0 mLs Intravenous Stopped 11/09/21 0539)  acetaminophen (TYLENOL) tablet 650 mg (has no administration in time range)    Or  acetaminophen (TYLENOL) suppository 650 mg (has no administration in time range)  ondansetron (ZOFRAN) tablet 4 mg (has no administration in time range)    Or  ondansetron (ZOFRAN) injection 4 mg (has no administration in time range)  metoCLOPramide (REGLAN) injection 10 mg (10 mg Intravenous Given 11/09/21 0552)  apixaban (ELIQUIS) tablet 5 mg (5 mg Oral Given 11/08/21 2208)  aspirin EC tablet 81 mg (has no administration in time range)  atorvastatin (LIPITOR) tablet 80 mg (has no administration in time range)  levETIRAcetam (KEPPRA) tablet 1,500 mg (1,500 mg Oral Given 11/08/21 2207)  sertraline (ZOLOFT) tablet 50 mg (has no administration in time range)  amitriptyline (ELAVIL) tablet 25 mg (25 mg Oral Given 11/08/21 2208)  sodium chloride 0.9 % bolus 1,000 mL (0 mLs  Intravenous Stopped 11/08/21 1946)  morphine (PF) 4 MG/ML injection 4 mg (4 mg Intravenous Given 11/08/21 1630)  metoCLOPramide (REGLAN) injection 10 mg (10 mg Intravenous Given 11/08/21 1626)  diphenhydrAMINE (BENADRYL) injection 25 mg (25 mg Intravenous Given 11/08/21 1626)    Vitals:   11/09/21 0245 11/09/21 0253 11/09/21 0254 11/09/21 0600  BP: 103/66  103/66 102/66  Pulse: 97 94 96 91  Resp:  '16 16 18  '$ Temp:   98 F (36.7 C)   TempSrc:   Oral   SpO2: 98% 97% 97% 97%    Final diagnoses:  Nausea and vomiting, unspecified vomiting type    Admission/ observation were discussed with the admitting physician, patient and/or family and they are comfortable with the plan.           Final Clinical Impression(s) / ED Diagnoses Final diagnoses:  Nausea and vomiting, unspecified vomiting type    Rx / DC Orders ED Discharge Orders     None         Deno Etienne, DO 11/09/21 [phone]

## 2021-11-08 NOTE — Hospital Course (Addendum)
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  Creatinine 2.2 (baseline 1.5).  CBC and UA without significant finding.  CT abdomen and pelvis without significant finding.  She was started on IV fluid and antiemetics and admitted.  Patient seems to be improving.  Advance diet to full liquid.  Remains on IV fluid.

## 2021-11-08 NOTE — Assessment & Plan Note (Signed)
Continue atorvastatin

## 2021-11-09 DIAGNOSIS — E785 Hyperlipidemia, unspecified: Secondary | ICD-10-CM | POA: Diagnosis present

## 2021-11-09 DIAGNOSIS — Z8673 Personal history of transient ischemic attack (TIA), and cerebral infarction without residual deficits: Secondary | ICD-10-CM

## 2021-11-09 DIAGNOSIS — Z9889 Other specified postprocedural states: Secondary | ICD-10-CM | POA: Diagnosis not present

## 2021-11-09 DIAGNOSIS — E872 Acidosis, unspecified: Secondary | ICD-10-CM

## 2021-11-09 DIAGNOSIS — D649 Anemia, unspecified: Secondary | ICD-10-CM | POA: Diagnosis not present

## 2021-11-09 DIAGNOSIS — I480 Paroxysmal atrial fibrillation: Secondary | ICD-10-CM

## 2021-11-09 DIAGNOSIS — G40909 Epilepsy, unspecified, not intractable, without status epilepticus: Secondary | ICD-10-CM | POA: Diagnosis present

## 2021-11-09 DIAGNOSIS — I69320 Aphasia following cerebral infarction: Secondary | ICD-10-CM | POA: Diagnosis not present

## 2021-11-09 DIAGNOSIS — Z934 Other artificial openings of gastrointestinal tract status: Secondary | ICD-10-CM | POA: Diagnosis not present

## 2021-11-09 DIAGNOSIS — N179 Acute kidney failure, unspecified: Secondary | ICD-10-CM | POA: Diagnosis present

## 2021-11-09 DIAGNOSIS — K2100 Gastro-esophageal reflux disease with esophagitis, without bleeding: Secondary | ICD-10-CM | POA: Diagnosis present

## 2021-11-09 DIAGNOSIS — Z823 Family history of stroke: Secondary | ICD-10-CM | POA: Diagnosis not present

## 2021-11-09 DIAGNOSIS — I10 Essential (primary) hypertension: Secondary | ICD-10-CM

## 2021-11-09 DIAGNOSIS — N1832 Chronic kidney disease, stage 3b: Secondary | ICD-10-CM

## 2021-11-09 DIAGNOSIS — E860 Dehydration: Secondary | ICD-10-CM | POA: Diagnosis present

## 2021-11-09 DIAGNOSIS — E871 Hypo-osmolality and hyponatremia: Secondary | ICD-10-CM | POA: Diagnosis present

## 2021-11-09 DIAGNOSIS — F32A Depression, unspecified: Secondary | ICD-10-CM | POA: Diagnosis present

## 2021-11-09 DIAGNOSIS — E1122 Type 2 diabetes mellitus with diabetic chronic kidney disease: Secondary | ICD-10-CM | POA: Diagnosis present

## 2021-11-09 DIAGNOSIS — J45909 Unspecified asthma, uncomplicated: Secondary | ICD-10-CM | POA: Diagnosis present

## 2021-11-09 DIAGNOSIS — N1831 Chronic kidney disease, stage 3a: Secondary | ICD-10-CM | POA: Diagnosis not present

## 2021-11-09 DIAGNOSIS — I129 Hypertensive chronic kidney disease with stage 1 through stage 4 chronic kidney disease, or unspecified chronic kidney disease: Secondary | ICD-10-CM | POA: Diagnosis present

## 2021-11-09 DIAGNOSIS — K209 Esophagitis, unspecified without bleeding: Secondary | ICD-10-CM | POA: Diagnosis not present

## 2021-11-09 DIAGNOSIS — K2960 Other gastritis without bleeding: Secondary | ICD-10-CM | POA: Diagnosis present

## 2021-11-09 DIAGNOSIS — Z980 Intestinal bypass and anastomosis status: Secondary | ICD-10-CM | POA: Diagnosis not present

## 2021-11-09 DIAGNOSIS — Z9884 Bariatric surgery status: Secondary | ICD-10-CM | POA: Diagnosis not present

## 2021-11-09 DIAGNOSIS — K2970 Gastritis, unspecified, without bleeding: Secondary | ICD-10-CM | POA: Diagnosis present

## 2021-11-09 DIAGNOSIS — R112 Nausea with vomiting, unspecified: Secondary | ICD-10-CM | POA: Diagnosis present

## 2021-11-09 DIAGNOSIS — R1115 Cyclical vomiting syndrome unrelated to migraine: Secondary | ICD-10-CM | POA: Diagnosis not present

## 2021-11-09 DIAGNOSIS — Z8711 Personal history of peptic ulcer disease: Secondary | ICD-10-CM | POA: Diagnosis not present

## 2021-11-09 DIAGNOSIS — E876 Hypokalemia: Secondary | ICD-10-CM | POA: Diagnosis present

## 2021-11-09 LAB — HIV ANTIBODY (ROUTINE TESTING W REFLEX): HIV Screen 4th Generation wRfx: NONREACTIVE

## 2021-11-09 LAB — HEMOGLOBIN A1C
Hgb A1c MFr Bld: 5.4 % (ref 4.8–5.6)
Mean Plasma Glucose: 108.28 mg/dL

## 2021-11-09 LAB — GLUCOSE, CAPILLARY: Glucose-Capillary: 104 mg/dL — ABNORMAL HIGH (ref 70–99)

## 2021-11-09 LAB — COMPREHENSIVE METABOLIC PANEL
ALT: 19 U/L (ref 0–44)
AST: 44 U/L — ABNORMAL HIGH (ref 15–41)
Albumin: 3.3 g/dL — ABNORMAL LOW (ref 3.5–5.0)
Alkaline Phosphatase: 138 U/L — ABNORMAL HIGH (ref 38–126)
Anion gap: 11 (ref 5–15)
BUN: 35 mg/dL — ABNORMAL HIGH (ref 8–23)
CO2: 14 mmol/L — ABNORMAL LOW (ref 22–32)
Calcium: 9 mg/dL (ref 8.9–10.3)
Chloride: 108 mmol/L (ref 98–111)
Creatinine, Ser: 1.9 mg/dL — ABNORMAL HIGH (ref 0.44–1.00)
GFR, Estimated: 29 mL/min — ABNORMAL LOW (ref >60)
Glucose, Bld: 100 mg/dL — ABNORMAL HIGH (ref 70–99)
Potassium: 4.2 mmol/L (ref 3.5–5.1)
Sodium: 133 mmol/L — ABNORMAL LOW (ref 135–145)
Total Bilirubin: 0.8 mg/dL (ref 0.3–1.2)
Total Protein: 6.3 g/dL — ABNORMAL LOW (ref 6.5–8.1)

## 2021-11-09 LAB — MAGNESIUM: Magnesium: 1.9 mg/dL (ref 1.7–2.4)

## 2021-11-09 MED ORDER — SODIUM BICARBONATE 650 MG PO TABS
650.0000 mg | ORAL_TABLET | Freq: Three times a day (TID) | ORAL | Status: DC
  Administered 2021-11-09 – 2021-11-15 (×16): 650 mg via ORAL
  Filled 2021-11-09 (×18): qty 1

## 2021-11-09 MED ORDER — SODIUM CHLORIDE 0.9 % IV SOLN: INTRAVENOUS | Status: DC

## 2021-11-09 MED ORDER — PANTOPRAZOLE SODIUM 40 MG IV SOLR
40.0000 mg | INTRAVENOUS | Status: DC
  Administered 2021-11-09: 40 mg via INTRAVENOUS
  Filled 2021-11-09: qty 10

## 2021-11-09 NOTE — Plan of Care (Signed)
  Problem: Education: Goal: Knowledge of General Education information will improve Description: Including pain rating scale, medication(s)/side effects and non-pharmacologic comfort measures Outcome: Progressing   Problem: Clinical Measurements: Goal: Ability to maintain clinical measurements within normal limits will improve Outcome: Progressing   

## 2021-11-09 NOTE — Assessment & Plan Note (Signed)
In the setting of dehydration and AKI.  Continue monitoring

## 2021-11-09 NOTE — TOC Initial Note (Signed)
Transition of Care [facility]) - Initial/Assessment Note    Patient Details  Name: Courtney Burke MRN: [id number] Date of Birth: November 18, 1955  [facility] CM/SW Contact:    Tom-Johnson, Renea Ee, RN Phone Number: 11/09/2021, 3:37 PM  Clinical Narrative:                  CM spoke with patient and daughter, Courtney Burke at bedside about needs for post hospital transition. Patient gave CM permission to speak with [facility]. Admitted for Intractable Nausea and Vomiting.  From home with [facility], her only child. Has one living sibling who is very supportive, takes patient to and from her appointments and patient has not been able to drive since her prior Stroke. 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  Has a walker, w/c, ramp, and shower seat at home.  PCP is [name], PA-C [facility] [address]  Patient was active with [facility] in 2022 with home health disciplines. Patient states she would like to use their services if recommended. Awaits PT/OT eval for disposition and needs.      Barriers to Discharge: Continued Medical Work up   Patient Goals and CMS Choice Patient states their goals for this hospitalization and ongoing recovery are:: To return home CMS Medicare.gov Compare Post Acute Care list provided to:: Patient    Expected Discharge Plan and Services     Discharge Planning Services: CM Consult   Living arrangements for the past 2 months: Single Family Home                                      Prior Living Arrangements/Services Living arrangements for the past 2 months: Single Family Home Lives with:: Adult Children (Daughter) Patient language and need for interpreter reviewed:: Yes Do you feel safe going back to the place where you live?: Yes      Need for Family Participation in Patient Care: Yes (Comment) Care giver support system in place?: Yes (comment) Current home services: DME (W/c, walker, shower seat,  ramp.) Criminal Activity/Legal Involvement Pertinent to Current Situation/Hospitalization: No - Comment as needed  Activities of Daily Living      Permission Sought/Granted Permission sought to share information with : Case Manager Permission granted to share information with : Yes, Verbal Permission Granted              Emotional Assessment Appearance:: Appears stated age Attitude/Demeanor/Rapport: Engaged, Gracious Affect (typically observed): Accepting, Appropriate, Calm, Hopeful Orientation: : Oriented to Self, Oriented to Place, Oriented to Situation Alcohol / Substance Use: Not Applicable Psych Involvement: No (comment)  Admission diagnosis:  Intractable nausea and vomiting [R11.2] Nausea and vomiting, unspecified vomiting type [R11.2] Patient Active Problem List   Diagnosis Date Noted   Hyponatremia 25/42/7062   Metabolic acidosis 37/62/8315   Intractable nausea and vomiting 11/08/2021   AKI on CKD-3B 11/08/2021   Paroxysmal atrial fibrillation ([place]) 11/08/2021   History of CVA (cerebrovascular accident) 07/24/2021   Syncope 05/16/2021   Breast lump 05/16/2021   Seizure disorder ([place]) 05/16/2021   CVA (cerebral vascular accident) ([place]) 05/11/2021   Acute cerebral infarction ([place]) 05/10/2021   Dyslipidemia 05/09/2021   Diabetes mellitus type 2 in obese ([place]) 05/09/2021   Dysphagia, post-stroke    Cerebral infarction due to unspecified occlusion or stenosis of left middle cerebral artery ([place]) 06/30/2020   Gallstones 08/18/2019   Esophagitis 07/20/2019  Erosive gastritis 07/20/2019   Nausea vomiting and diarrhea 07/16/2019   Chronic bilateral thoracic back pain 06/05/2018   Large breasts 06/05/2018   Seasonal allergic rhinitis due to pollen 05/09/2017   Microcalcifications of the breast 07/02/2016   Mild intermittent asthma without complication 83/38/2505   Severe single current episode of major depressive disorder, without psychotic features ([place]) 05/08/2016    Vitamin D deficiency 05/08/2016   Encounter for general adult medical examination without abnormal findings 05/08/2016   Persistent headaches 02/16/2016   Age-related nuclear cataract of both eyes 09/28/2015   Asthma 09/28/2015   Essential hypertension 09/28/2015   Essential thrombocytosis ([place]) 09/28/2015   Gastroesophageal reflux disease without esophagitis 09/28/2015   Keratoconjunctivitis sicca of both eyes not specified as Sjogren's 09/28/2015   Status post gastric bypass for obesity 09/28/2015   Stage 3a chronic kidney disease ([place]) 09/28/2015   Hot flashes 09/28/2015   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   IFG (impaired fasting glucose) 09/28/2015   Menopausal disorder 09/28/2015   Obesity (BMI 30-39.9) 09/28/2015   Pelvic pain in female 09/28/2015   Presbyopia of both eyes 09/28/2015   Snoring 09/28/2015   Tinea corporis 09/28/2015   Osteopenia 07/15/2015   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

## 2021-11-09 NOTE — Progress Notes (Signed)
PROGRESS NOTE  Courtney Burke JME:[id number] DOB: 06-15-1955   PCP: [name], PA-C  Patient is from: Home.  Lives with daughter.  Independently ambulates at baseline.  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  Creatinine 2.2 (baseline 1.5).  CBC and UA without significant finding.  CT abdomen and pelvis without significant finding.  She was started on IV fluid and antiemetics and admitted.   Subjective: Seen and examined earlier this morning.  No major events overnight of this morning.  Continues to endorse nausea and heaving.  She is spitting up some phlegm.  No large-volume emesis.  She denies abdominal pain or diarrhea.  Denies chest pain, dyspnea or UTI symptoms.  Objective: Vitals:   11/09/21 0800 11/09/21 0930 11/09/21 1015 11/09/21 1118  BP: 105/70 113/68 118/68 120/74  Pulse: 91 80 83   Resp: '16 16 16   '$ Temp:   98.7 F (37.1 C) 98.4 F (36.9 C)  TempSrc:    Oral  SpO2: 97% 97% 98% 98%  Weight:    61.8 kg  Height:    '5\' 2"'$  (1.575 m)    Examination:  GENERAL: No apparent distress.  Nontoxic. HEENT: MMM.  Vision and hearing grossly intact.  NECK: Supple.  No apparent JVD.  RESP:  No IWOB.  Fair aeration bilaterally. CVS:  RRR. Heart sounds normal.  ABD/GI/GU: BS+. Abd soft.  Mild diffuse tenderness. MSK/EXT:  Moves extremities. No apparent deformity. No edema.  SKIN: no apparent skin lesion or wound NEURO: Awake and alert.  Fairly oriented but looks confused at times probably due to underlying receptive  aphasia and expressive aphasia.  No apparent focal neuro deficit other than aphasia. PSYCH: Appears anxious.  Procedures:  None  Microbiology summarized: None  Assessment and Plan: * Intractable nausea and vomiting Unclear etiology of this.  She has no abdominal pain but mild diffuse tenderness.  She has no leukocytosis, fever or diarrhea.  CT A/P without acute finding.  She has history of sleeve gastrectomy.  She continues to endorse nausea and dry heaving, and spitting up some phlegm but no large-volume emesis. -Continue IV fluid -Continue scheduled IV Reglan with IV Zofran as needed -Add IV Protonix. -Advance to clear liquid diet. -Monitor electrolytes and replenish as appropriate  AKI on CKD-3B Recent Labs    05/12/21 0350 05/15/21 2129 05/15/21 2137 05/16/21 0230 05/17/21 0429 05/18/21 0251 06/01/21 1225 08/28/21 1316 11/08/21 1214 11/09/21 0305  BUN '22 14 16 13 16 17 11 20 '$ 37* 35*  CREATININE 1.78* 1.54* 1.70* 1.39* 1.43* 1.35* 1.44* 1.53* 2.22* 1.90*  Likely prerenal in the setting of GI loss.  Improving with IV fluid. -Continue IV fluid hydration -Continue monitoring   History of CVA (cerebrovascular accident) Seems to have residual receptive and expressive aphasia.  S/p left MCA stent January 2022. -Continue Eliquis, aspirin, atorvastatin -PT/OT eval  Paroxysmal atrial fibrillation (HCC) In sinus rhythm.  Not on rate/rhythm control meds. -Continue Eliquis  Metabolic acidosis In the setting of nausea and vomiting. -Start sodium bicarbonate  Hyponatremia In the setting of dehydration  and AKI.  Continue monitoring  Seizure disorder (HCC) Continue home Keppra 1500 mg BID.  Hyperlipidemia, unspecified Continue atorvastatin.  Essential hypertension Normotensive off home antihypertensive meds.  Reportedly not taking amlodipine and Diovan -Continue monitoring    DVT prophylaxis:   apixaban (ELIQUIS) tablet 5 mg  Code Status: Full code Family  Communication: Updated patient's daughter at bedside. Level of care: Telemetry Medical Status is: Observation The patient will require care spanning > 2 midnights and should be moved to inpatient because: Intractable nausea and vomiting, dehydration and AKI requiring IV fluid   Final disposition: TBD after therapy. Consultants:  None  Sch Meds:  Scheduled Meds:  amitriptyline  25 mg Oral QHS   apixaban  5 mg Oral BID   aspirin EC  81 mg Oral Daily   atorvastatin  80 mg Oral Daily   levETIRAcetam  1,500 mg Oral BID   metoCLOPramide (REGLAN) injection  10 mg Intravenous Q8H   pantoprazole (PROTONIX) IV  40 mg Intravenous Q24H   sertraline  50 mg Oral Daily   sodium bicarbonate  650 mg Oral TID   sodium chloride flush  3 mL Intravenous Q12H   Continuous Infusions: PRN Meds:.acetaminophen **OR** acetaminophen, ondansetron **OR** ondansetron (ZOFRAN) IV  Antimicrobials: Anti-infectives (From admission, onward)    None        I have personally reviewed the following labs and images: CBC: Recent Labs  Lab 11/08/21 1214  WBC 6.8  NEUTROABS 3.9  HGB 14.9  HCT 44.7  MCV 84.3  PLT 609*   BMP &GFR Recent Labs  Lab 11/08/21 1214 11/09/21 0305  NA 135 133*  K 4.9 4.2  CL 109 108  CO2 15* 14*  GLUCOSE 150* 100*  BUN 37* 35*  CREATININE 2.22* 1.90*  CALCIUM 9.9 9.0  MG  --  1.9   Estimated Creatinine Clearance: 24.9 mL/min (A) (by C-G formula based on SCr of 1.9 mg/dL (H)). Liver & Pancreas: Recent Labs  Lab 11/08/21 1214 11/09/21 0305  AST 26 44*  ALT 15 19  ALKPHOS 165* 138*  BILITOT 1.0 0.8  PROT 7.6 6.3*  ALBUMIN 4.1 3.3*   Recent Labs  Lab 11/08/21 1214  LIPASE 31   No results for input(s): "AMMONIA" in the last 168 hours. Diabetic: No results for input(s): "HGBA1C" in the last 72 hours. Recent Labs  Lab 11/09/21 1125  GLUCAP 104*   Cardiac Enzymes: No results for input(s): "CKTOTAL", "CKMB", "CKMBINDEX", "TROPONINI" in the last 168  hours. No results for input(s): "PROBNP" in the last 8760 hours. Coagulation Profile: No results for input(s): "INR", "PROTIME" in the last 168 hours. Thyroid Function Tests: No results for input(s): "TSH", "T4TOTAL", "FREET4", "T3FREE", "THYROIDAB" in the last 72 hours. Lipid Profile: No results for input(s): "CHOL", "HDL", "LDLCALC", "TRIG", "CHOLHDL", "LDLDIRECT" in the last 72 hours. Anemia Panel: No results for input(s): "VITAMINB12", "FOLATE", "FERRITIN", "TIBC", "IRON", "RETICCTPCT" in the last 72 hours. Urine analysis:    Component Value Date/Time   COLORURINE YELLOW 11/08/2021 1250   APPEARANCEUR HAZY (A) 11/08/2021 1250   LABSPEC 1.013 11/08/2021 1250   PHURINE 5.0 11/08/2021 1250   GLUCOSEU NEGATIVE 11/08/2021 1250   HGBUR SMALL (A) 11/08/2021 1250   BILIRUBINUR NEGATIVE 11/08/2021 1250   KETONESUR 5 (A) 11/08/2021 1250   PROTEINUR NEGATIVE 11/08/2021 1250   NITRITE NEGATIVE 11/08/2021 1250   LEUKOCYTESUR TRACE (A) 11/08/2021 1250   Sepsis Labs: Invalid input(s): "PROCALCITONIN", "LACTICIDVEN"  Microbiology: No results found for this or any previous visit (from the  past 240 hour(s)).  Radiology Studies: CT ABDOMEN PELVIS WO CONTRAST  Result Date: 11/08/2021 CLINICAL DATA:  Abdominal pain, nausea EXAM: CT ABDOMEN AND PELVIS WITHOUT CONTRAST TECHNIQUE: Multidetector CT imaging of the abdomen and pelvis was performed following the standard protocol without IV contrast. RADIATION DOSE REDUCTION: This exam was performed according to the departmental dose-optimization program which includes automated exposure control, adjustment of the mA and/or kV according to patient size and/or use of iterative reconstruction technique. COMPARISON:  07/02/2020 FINDINGS: Lower chest: No pleural or pericardial effusion. Improved aeration in the visualized lung bases. Hepatobiliary: No focal liver abnormality is seen. Status post cholecystectomy. No biliary dilatation. Pancreas: Unremarkable. No  pancreatic ductal dilatation or surrounding inflammatory changes. Spleen: Normal in size without focal abnormality. Adrenals/Urinary Tract: No adrenal mass. Symmetric renal contours with no urolithiasis or hydronephrosis. Urinary bladder incompletely distended. Stomach/Bowel: Changes of partial gastrectomy and gastrojejunostomy. Small bowel is nondilated. Appendix not discretely identified. The colon is incompletely distended, unremarkable. Vascular/Lymphatic: Mild scattered aortoiliac calcified atheromatous plaque without aneurysm. No abdominal or pelvic adenopathy. Reproductive: Uterus and bilateral adnexa are unremarkable. Other: No ascites.  Bilateral pelvic phleboliths.  No free air. Musculoskeletal: No acute or significant osseous findings. IMPRESSION: 1. No acute findings. 2. Post partial gastrectomy 3.  Aortic Atherosclerosis (ICD10-170.0). Electronically Signed   By: Lucrezia Europe M.D.   On: 11/08/2021 17:26      Jovani Flury T. [place]  If 7PM-7AM, please contact night-coverage [email or web address] 11/09/2021, 12:34 PM

## 2021-11-09 NOTE — Assessment & Plan Note (Addendum)
In the setting of nausea and vomiting.  Improving. -Continue sodium bicarbonate

## 2021-11-10 DIAGNOSIS — Z8673 Personal history of transient ischemic attack (TIA), and cerebral infarction without residual deficits: Secondary | ICD-10-CM | POA: Diagnosis not present

## 2021-11-10 DIAGNOSIS — D649 Anemia, unspecified: Secondary | ICD-10-CM

## 2021-11-10 DIAGNOSIS — R112 Nausea with vomiting, unspecified: Secondary | ICD-10-CM | POA: Diagnosis not present

## 2021-11-10 DIAGNOSIS — N179 Acute kidney failure, unspecified: Secondary | ICD-10-CM | POA: Diagnosis not present

## 2021-11-10 DIAGNOSIS — I480 Paroxysmal atrial fibrillation: Secondary | ICD-10-CM | POA: Diagnosis not present

## 2021-11-10 LAB — RENAL FUNCTION PANEL
Albumin: 2.9 g/dL — ABNORMAL LOW (ref 3.5–5.0)
Anion gap: 4 — ABNORMAL LOW (ref 5–15)
BUN: 26 mg/dL — ABNORMAL HIGH (ref 8–23)
CO2: 19 mmol/L — ABNORMAL LOW (ref 22–32)
Calcium: 8.6 mg/dL — ABNORMAL LOW (ref 8.9–10.3)
Chloride: 112 mmol/L — ABNORMAL HIGH (ref 98–111)
Creatinine, Ser: 1.75 mg/dL — ABNORMAL HIGH (ref 0.44–1.00)
GFR, Estimated: 32 mL/min — ABNORMAL LOW (ref >60)
Glucose, Bld: 93 mg/dL (ref 70–99)
Phosphorus: 3.5 mg/dL (ref 2.5–4.6)
Potassium: 3.6 mmol/L (ref 3.5–5.1)
Sodium: 135 mmol/L (ref 135–145)

## 2021-11-10 LAB — LIPASE, BLOOD: Lipase: 31 U/L (ref 11–51)

## 2021-11-10 LAB — CBC
HCT: 33.5 % — ABNORMAL LOW (ref 36.0–46.0)
Hemoglobin: 11.7 g/dL — ABNORMAL LOW (ref 12.0–15.0)
MCH: 28.8 pg (ref 26.0–34.0)
MCHC: 34.9 g/dL (ref 30.0–36.0)
MCV: 82.5 fL (ref 80.0–100.0)
Platelets: 376 10*3/uL (ref 150–400)
RBC: 4.06 MIL/uL (ref 3.87–5.11)
RDW: 12.9 % (ref 11.5–15.5)
WBC: 6.1 10*3/uL (ref 4.0–10.5)
nRBC: 0 % (ref 0.0–0.2)

## 2021-11-10 LAB — MAGNESIUM: Magnesium: 1.8 mg/dL (ref 1.7–2.4)

## 2021-11-10 LAB — CK: Total CK: 93 U/L (ref 38–234)

## 2021-11-10 MED ORDER — PANTOPRAZOLE SODIUM 40 MG IV SOLR
40.0000 mg | Freq: Two times a day (BID) | INTRAVENOUS | Status: DC
Start: 2021-11-10 — End: 2021-11-15
  Administered 2021-11-10 – 2021-11-15 (×11): 40 mg via INTRAVENOUS
  Filled 2021-11-10 (×11): qty 10

## 2021-11-10 NOTE — Assessment & Plan Note (Signed)
Recent Labs    05/11/21 0416 05/12/21 0350 05/15/21 2129 05/15/21 2137 05/16/21 0230 05/17/21 0429 05/18/21 0251 06/01/21 1225 11/08/21 1214 11/10/21 0336  HGB 10.2* 10.7* 10.7* 10.5* 10.1* 10.0* 10.3* 10.8* 14.9 11.7*  Initial Hgb likely hemoconcentration in the setting of dehydration.  H&H seems to be at baseline. -Check anemia panel -Continue monitoring

## 2021-11-10 NOTE — Progress Notes (Signed)
PROGRESS NOTE  Courtney Burke DQQ:[id number] DOB: 1955/12/22   PCP: [name], PA-C  Patient is from: Home.  Lives with daughter.  Independently ambulates at baseline.  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  Creatinine 2.2 (baseline 1.5).  CBC and UA without significant finding.  CT abdomen and pelvis without significant finding.  She was started on IV fluid and antiemetics and admitted.  Patient seems to be improving.  Advance diet to full liquid.  Remains on IV fluid.   Subjective: Seen and examined earlier this morning.  No major events overnight or this morning.  Feels better today although she continues to endorse nausea and dry heaving.  She also reports epigastric pain.  Has not a bowel movement yet.  No notable emesis per patient's brother at bedside.  Objective: Vitals:   11/09/21 1855 11/09/21 2237 11/10/21 0456 11/10/21 0929  BP: 110/68 109/73 104/64 100/64  Pulse: 78 82 69 68  Resp: '18 18 15 18  '$ Temp: 98 F (36.7 C) 98.8 F (37.1 C) (!) 97.3 F (36.3 C) 97.6 F (36.4 C)  TempSrc: Oral Oral Oral Oral  SpO2: 98% 98% 99% 100%  Weight:      Height:        Examination:  GENERAL: No apparent distress.  Nontoxic. HEENT: MMM.  Vision and hearing grossly intact.  NECK: Supple.  No apparent JVD.  RESP:  No IWOB.  Fair aeration bilaterally. CVS:  RRR. Heart sounds normal.  ABD/GI/GU: BS+. Abd soft.  Epigastric tenderness. MSK/EXT:  Moves extremities. No apparent deformity. No edema.  SKIN: no apparent skin  lesion or wound NEURO: Awake and alert.  Follows commands.  Fairly oriented but she seems to have receptive and expressive aphasia.  Motor strength is symmetric in all extremities. PSYCH: Calm. Normal affect.   Procedures:  None  Microbiology summarized: None  Assessment and Plan: * Intractable nausea and vomiting Unclear etiology of this.  She has no abdominal pain but mild diffuse tenderness.  She has no leukocytosis, fever or diarrhea.  CT A/P without acute finding.  She has history of sleeve gastrectomy.  Continues to endorse nausea and heaving but no witnessed emesis.  She has epigastric pain and tenderness.. -Continue IV fluid, scheduled Reglan and as needed Zofran. -Increase IV Protonix to 40 mg twice daily -Advance to full liquid diet. -Monitor electrolytes and replenish as appropriate  AKI on CKD-3B Recent Labs    05/15/21 2129 05/15/21 2137 05/16/21 0230 05/17/21 0429 05/18/21 0251 06/01/21 1225 08/28/21 1316 11/08/21 1214 11/09/21 0305 11/10/21 0336  BUN '14 16 13 16 17 11 20 '$ 37* 35* 26*  CREATININE 1.54* 1.70* 1.39* 1.43* 1.35* 1.44* 1.53* 2.22* 1.90* 1.75*  Likely prerenal in the setting of GI loss.  Improving with IV fluid. -Continue IV fluid hydration -Continue monitoring   History of CVA (cerebrovascular accident) Seems to have residual receptive and expressive aphasia.  S/p left MCA stent January 2022. -Continue Eliquis, aspirin, atorvastatin -PT/OT eval  Paroxysmal atrial fibrillation (HCC) In sinus rhythm.  Not on rate/rhythm control meds. -  Continue Eliquis  Normocytic anemia Recent Labs    05/11/21 0416 05/12/21 0350 05/15/21 2129 05/15/21 2137 05/16/21 0230 05/17/21 0429 05/18/21 0251 06/01/21 1225 11/08/21 1214 11/10/21 0336  HGB 10.2* 10.7* 10.7* 10.5* 10.1* 10.0* 10.3* 10.8* 14.9 11.7*  Initial Hgb likely hemoconcentration in the setting of dehydration.  H&H seems to be at baseline. -Check anemia panel -Continue  monitoring  Metabolic acidosis In the setting of nausea and vomiting.  Improving. -Continue sodium bicarbonate  Hyponatremia In the setting of dehydration and AKI.  Continue monitoring  Seizure disorder (HCC) Continue home Keppra 1500 mg BID.  Hyperlipidemia, unspecified Continue atorvastatin.  Essential hypertension Normotensive off home antihypertensive meds.  Reportedly not taking amlodipine and Diovan -Continue monitoring    DVT prophylaxis:   apixaban (ELIQUIS) tablet 5 mg  Code Status: Full code Family Communication: Updated patient's brother at bedside. Level of care: Med-Surg Status is: Inpatient The patient will remain inpatient because: Intractable nausea and vomiting, dehydration and AKI requiring IV fluid   Final disposition: Home with home health once medically stable. Consultants:  None  Sch Meds:  Scheduled Meds:  amitriptyline  25 mg Oral QHS   apixaban  5 mg Oral BID   aspirin EC  81 mg Oral Daily   atorvastatin  80 mg Oral Daily   levETIRAcetam  1,500 mg Oral BID   metoCLOPramide (REGLAN) injection  10 mg Intravenous Q8H   pantoprazole (PROTONIX) IV  40 mg Intravenous Q12H   sertraline  50 mg Oral Daily   sodium bicarbonate  650 mg Oral TID   sodium chloride flush  3 mL Intravenous Q12H   Continuous Infusions:  sodium chloride 100 mL/hr at 11/09/21 1555   PRN Meds:.acetaminophen **OR** acetaminophen, ondansetron **OR** ondansetron (ZOFRAN) IV  Antimicrobials: Anti-infectives (From admission, onward)    None        I have personally reviewed the following labs and images: CBC: Recent Labs  Lab 11/08/21 1214 11/10/21 0336  WBC 6.8 6.1  NEUTROABS 3.9  --   HGB 14.9 11.7*  HCT 44.7 33.5*  MCV 84.3 82.5  PLT 609* 376   BMP &GFR Recent Labs  Lab 11/08/21 1214 11/09/21 0305 11/10/21 0336  NA 135 133* 135  K 4.9 4.2 3.6  CL 109 108 112*  CO2 15* 14* 19*  GLUCOSE 150* 100* 93  BUN 37* 35* 26*  CREATININE 2.22* 1.90* 1.75*   CALCIUM 9.9 9.0 8.6*  MG  --  1.9 1.8  PHOS  --   --  3.5   Estimated Creatinine Clearance: 27 mL/min (A) (by C-G formula based on SCr of 1.75 mg/dL (H)). Liver & Pancreas: Recent Labs  Lab 11/08/21 1214 11/09/21 0305 11/10/21 0336  AST 26 44*  --   ALT 15 19  --   ALKPHOS 165* 138*  --   BILITOT 1.0 0.8  --   PROT 7.6 6.3*  --   ALBUMIN 4.1 3.3* 2.9*   Recent Labs  Lab 11/08/21 1214 11/10/21 0336  LIPASE 31 31   No results for input(s): "AMMONIA" in the last 168 hours. Diabetic: Recent Labs    11/09/21 0305  HGBA1C 5.4   Recent Labs  Lab 11/09/21 1125  GLUCAP 104*   Cardiac Enzymes: Recent Labs  Lab 11/10/21 0336  CKTOTAL 93   No results for input(s): "PROBNP" in the last 8760 hours. Coagulation Profile: No results for input(s): "INR", "PROTIME" in the last 168 hours. Thyroid Function Tests: No results for input(s): "TSH", "T4TOTAL", "FREET4", "  T3FREE", "THYROIDAB" in the last 72 hours. Lipid Profile: No results for input(s): "CHOL", "HDL", "LDLCALC", "TRIG", "CHOLHDL", "LDLDIRECT" in the last 72 hours. Anemia Panel: No results for input(s): "VITAMINB12", "FOLATE", "FERRITIN", "TIBC", "IRON", "RETICCTPCT" in the last 72 hours. Urine analysis:    Component Value Date/Time   COLORURINE YELLOW 11/08/2021 1250   APPEARANCEUR HAZY (A) 11/08/2021 1250   LABSPEC 1.013 11/08/2021 1250   PHURINE 5.0 11/08/2021 1250   GLUCOSEU NEGATIVE 11/08/2021 1250   HGBUR SMALL (A) 11/08/2021 1250   BILIRUBINUR NEGATIVE 11/08/2021 1250   KETONESUR 5 (A) 11/08/2021 1250   PROTEINUR NEGATIVE 11/08/2021 1250   NITRITE NEGATIVE 11/08/2021 1250   LEUKOCYTESUR TRACE (A) 11/08/2021 1250   Sepsis Labs: Invalid input(s): "PROCALCITONIN", "LACTICIDVEN"  Microbiology: No results found for this or any previous visit (from the past 240 hour(s)).  Radiology Studies: No results found.    Jacobus Colvin T. [place]  If 7PM-7AM, please contact  night-coverage [email or web address] 11/10/2021, 2:57 PM

## 2021-11-10 NOTE — Evaluation (Signed)
Occupational Therapy Evaluation Patient Details Name: Courtney Burke MRN: [id number] DOB: 1955-09-09 Today's Date: 11/10/2021   History of Present Illness 66 y/o female presented to ED on 11/08/21 for persistent nausea and vomiting x 1 week. Admitted for AKI on CKD. PMH: L MCA stroke s/p stenting, seizures on keppra, expressive aphasia, HTN, gastric sleeve surgery, CKD3B,T2DM   Clinical Impression   Pt admitted for concerns listed above. PTA pt has had a previous CVA resulting in Global Aphasia. Pt is able to express some things, however often demonstrates difficulties. From her report she states she is fairly independent with functional mobility and BADL's, and family assists with IADL's. At this time, pt presents with increased weakness and balance deficits, with noted decreased safety awareness. Overall requiring min A for BADL's and functional mobility with a RW. OT will follow acutely.      Recommendations for follow up therapy are one component of a multi-disciplinary discharge planning process, led by the attending physician.  Recommendations may be updated based on patient status, additional functional criteria and insurance authorization.   Follow Up Recommendations  Home health OT    Assistance Recommended at Discharge Intermittent Supervision/Assistance  Patient can return home with the following A little help with walking and/or transfers;A little help with bathing/dressing/bathroom;Assistance with cooking/housework;Direct supervision/assist for medications management;Direct supervision/assist for financial management;Assist for transportation;Help with stairs or ramp for entrance    Functional Status Assessment  Patient has had a recent decline in their functional status and demonstrates the ability to make significant improvements in function in a reasonable and predictable amount of time.  Equipment Recommendations  None recommended by OT    Recommendations for Other  Services       Precautions / Restrictions Precautions Precautions: Fall Restrictions Weight Bearing Restrictions: No      Mobility Bed Mobility Overal bed mobility: Modified Independent                  Transfers Overall transfer level: Needs assistance Equipment used: Rolling walker (2 wheels) Transfers: Sit to/from Stand Sit to Stand: Min guard           General transfer comment: Min guard to stand and steady for sfaety      Balance Overall balance assessment: Mild deficits observed, not formally tested                                         ADL either performed or assessed with clinical judgement   ADL Overall ADL's : Needs assistance/impaired Eating/Feeding: Set up;Sitting   Grooming: Set up;Sitting   Upper Body Bathing: Min guard;Sitting   Lower Body Bathing: Minimal assistance;Sitting/lateral leans;Sit to/from stand   Upper Body Dressing : Min guard;Sitting   Lower Body Dressing: Minimal assistance;Sitting/lateral leans;Sit to/from stand   Toilet Transfer: Minimal assistance;Ambulation   Toileting- Clothing Manipulation and Hygiene: Minimal assistance;Sitting/lateral lean;Sit to/from stand       Functional mobility during ADLs: Minimal assistance;Rolling walker (2 wheels) General ADL Comments: Pt presents with increased weakness and balance concerns, requiring up to min A with standing BADL's     Vision Baseline Vision/History: 2 Legally blind Ability to See in Adequate Light: 1 Impaired Patient Visual Report: No change from baseline Vision Assessment?: Vision impaired- to be further tested in functional context Additional Comments: Vision not further impaired from prior CVA     Perception     Praxis  Pertinent Vitals/Pain Pain Assessment Pain Assessment: No/denies pain     Hand Dominance Right   Extremity/Trunk Assessment Upper Extremity Assessment Upper Extremity Assessment: Generalized weakness    Lower Extremity Assessment Lower Extremity Assessment: Defer to PT evaluation   Cervical / Trunk Assessment Cervical / Trunk Assessment: Normal   Communication Communication Communication: Receptive difficulties;Expressive difficulties   Cognition Arousal/Alertness: Awake/alert Behavior During Therapy: WFL for tasks assessed/performed Overall Cognitive Status: History of cognitive impairments - at baseline                                 General Comments: Pt globally aphasic, with limited safety awareness     General Comments  VSS on RA    Exercises     Shoulder Instructions      Home Living Family/patient expects to be discharged to:: Private residence Living Arrangements: Children;Other relatives Available Help at Discharge: Family Type of Home: House Home Access: Ramped entrance     Home Layout: Two level;Able to live on main level with bedroom/bathroom     Bathroom Shower/Tub: Teacher, early years/pre: Standard Bathroom Accessibility: Yes How Accessible: Accessible via walker Home Equipment: [place] (2 wheels);Cane - single point;Shower seat   Additional Comments: Pt unable to report information regarding her d/c location due to global aphasia; information from previous medical record      Prior Functioning/Environment Prior Level of Function : Needs assist             Mobility Comments: indicates she used to use a RW and does not use anymore ADLs Comments: reports indepednence with BADL's, family assists with IADL's        OT Problem List: Decreased strength;Decreased activity tolerance;Impaired balance (sitting and/or standing);Decreased coordination;Decreased safety awareness      OT Treatment/Interventions: Self-care/ADL training;Therapeutic exercise;Energy conservation;DME and/or AE instruction;Therapeutic activities;Cognitive remediation/compensation;Balance training;Patient/family education    OT Goals(Current  goals can be found in the care plan section) Acute Rehab OT Goals Patient Stated Goal: None stated OT Goal Formulation: With patient Time For Goal Achievement: 11/24/21 Potential to Achieve Goals: Good ADL Goals Pt Will Perform Grooming: with modified independence;standing Pt Will Perform Lower Body Bathing: with modified independence;sitting/lateral leans;sit to/from stand Pt Will Perform Lower Body Dressing: with modified independence;sitting/lateral leans;sit to/from stand Pt Will Transfer to Toilet: with modified independence;ambulating Pt Will Perform Toileting - Clothing Manipulation and hygiene: with modified independence;sitting/lateral leans;sit to/from stand  OT Frequency: Min 2X/week    Co-evaluation              AM-PAC OT "6 Clicks" Daily Activity     Outcome Measure Help from another person eating meals?: A Little Help from another person taking care of personal grooming?: A Little Help from another person toileting, which includes using toliet, bedpan, or urinal?: A Little Help from another person bathing (including washing, rinsing, drying)?: A Little Help from another person to put on and taking off regular upper body clothing?: A Little Help from another person to put on and taking off regular lower body clothing?: A Little 6 Click Score: 18   End of Session Equipment Utilized During Treatment: Gait belt;Rolling walker (2 wheels) Nurse Communication: Mobility status  Activity Tolerance: Patient tolerated treatment well Patient left: in bed;with call bell/phone within reach;with bed alarm set  OT Visit Diagnosis: Unsteadiness on feet (R26.81);Other abnormalities of gait and mobility (R26.89);Muscle weakness (generalized) (M62.81)  Time: 2890-2284 OT Time Calculation (min): 19 min Charges:  OT General Charges $OT Visit: 1 Visit OT Evaluation $OT Eval Moderate Complexity: 1 Mod  Frans Valente H., OTR/L Acute Rehabilitation  Tanette Chauca Elane  Yolanda Bonine 11/10/2021, 12:39 PM

## 2021-11-10 NOTE — Evaluation (Signed)
Physical Therapy Evaluation Patient Details Name: Courtney Burke MRN: [id number] DOB: 02-02-1956 Today's Date: 11/10/2021  History of Present Illness  66 y/o female presented to ED on 11/08/21 for persistent nausea and vomiting x 1 week. Admitted for AKI on CKD. PMH: L MCA stroke s/p stenting, seizures on keppra, expressive aphasia, HTN, gastric sleeve surgery, CKD3B,T2DM  Clinical Impression  Patient admitted with the above. PTA, patient has hx of CVA with R sided residual weakness and global aphasia which makes obtaining hx difficult. Patient attempting to tell this therapist that she was independent with mobility at home. Patient presents with weakness, impaired balance, decreased activity tolerance, and impulsivity. Patient requires min guard for ambulation with RW for safety and balance. Patient will benefit from skilled PT services during acute stay to address listed deficits. Recommend HHPT at discharge to maximize functional independence and safety.        Recommendations for follow up therapy are one component of a multi-disciplinary discharge planning process, led by the attending physician.  Recommendations may be updated based on patient status, additional functional criteria and insurance authorization.  Follow Up Recommendations Home health PT    Assistance Recommended at Discharge Frequent or constant Supervision/Assistance  Patient can return home with the following  A little help with walking and/or transfers;A little help with bathing/dressing/bathroom;Assistance with cooking/housework;Direct supervision/assist for financial management;Direct supervision/assist for medications management;Assist for transportation;Help with stairs or ramp for entrance    Equipment Recommendations None recommended by PT  Recommendations for Other Services       Functional Status Assessment Patient has had a recent decline in their functional status and demonstrates the ability to make  significant improvements in function in a reasonable and predictable amount of time.     Precautions / Restrictions Precautions Precautions: Fall Restrictions Weight Bearing Restrictions: No      Mobility  Bed Mobility Overal bed mobility: Modified Independent                  Transfers Overall transfer level: Needs assistance Equipment used: Rolling Courtney Burke (2 wheels) Transfers: Sit to/from Stand Sit to Stand: Min guard           General transfer comment: Min guard to stand and steady for sfaety    Ambulation/Gait Ambulation/Gait assistance: Min assist, Min guard Gait Distance (Feet): 10 Feet (+150') Assistive device: Rolling Courtney Burke (2 wheels) Gait Pattern/deviations: Step-through pattern, Decreased stride length, Decreased step length - right, Narrow base of support Gait velocity: decreased     General Gait Details: initially minA to ambulate to bathroom as patient was impulsively moving and using 1 hand to steer RW as she was holding gown with the other hand. Cues for hands on RW. Progressed to min guard in hallway with use of RW. Decreased weight shift to R and decreased step length. At times leaving R foot behind  Stairs            Wheelchair Mobility    Modified Rankin (Stroke Patients Only)       Balance Overall balance assessment: Needs assistance Sitting-balance support: No upper extremity supported, Feet supported Sitting balance-Courtney Burke: Good     Standing balance support: Bilateral upper extremity supported, Reliant on assistive device for balance Standing balance-Courtney Burke: Poor Standing balance comment: reliant on UE support                             Pertinent Vitals/Pain Pain Assessment Pain Assessment: No/denies  pain    Home Living Family/patient expects to be discharged to:: Private residence Living Arrangements: Children;Other relatives Available Help at Discharge: Family Type of Home: House Home  Access: Ramped entrance       Home Layout: Two level;Able to live on main level with bedroom/bathroom Home Equipment: Rolling [name] (2 wheels);Cane - single point;Shower seat Additional Comments: Pt unable to report information regarding her d/c location due to global aphasia; information from previous medical record    Prior Function Prior Level of Function : Needs assist             Mobility Comments: indicates she used to use a RW and does not use anymore ADLs Comments: reports indepednence with BADL's, family assists with IADL's     Hand Dominance   Dominant Hand: Right    Extremity/Trunk Assessment   Upper Extremity Assessment Upper Extremity Assessment: Defer to OT evaluation    Lower Extremity Assessment Lower Extremity Assessment: Generalized weakness (R>L)    Cervical / Trunk Assessment Cervical / Trunk Assessment: Normal  Communication   Communication: Receptive difficulties;Expressive difficulties  Cognition Arousal/Alertness: Awake/alert Behavior During Therapy: Impulsive Overall Cognitive Status: History of cognitive impairments - at baseline                                 General Comments: Pt globally aphasic, with limited safety awareness        General Comments General comments (skin integrity, edema, etc.): VSS on RA    Exercises     Assessment/Plan    PT Assessment Patient needs continued PT services  PT Problem List Decreased strength;Decreased activity tolerance;Decreased balance;Decreased mobility;Decreased coordination;Decreased cognition;Decreased knowledge of use of DME;Decreased safety awareness;Decreased knowledge of precautions       PT Treatment Interventions DME instruction;Gait training;Therapeutic activities;Functional mobility training;Therapeutic exercise;Balance training;Patient/family education    PT Goals (Current goals can be found in the Care Plan section)  Acute Rehab PT Goals Patient Stated Goal:  unable to state PT Goal Formulation: Patient unable to participate in goal setting Time For Goal Achievement: 11/24/21 Potential to Achieve Goals: Good    Frequency Min 3X/week     Co-evaluation               AM-PAC PT "6 Clicks" Mobility  Outcome Measure Help needed turning from your back to your side while in a flat bed without using bedrails?: None Help needed moving from lying on your back to sitting on the side of a flat bed without using bedrails?: None Help needed moving to and from a bed to a chair (including a wheelchair)?: A Little Help needed standing up from a chair using your arms (e.g., wheelchair or bedside chair)?: A Little Help needed to walk in hospital room?: A Little Help needed climbing 3-5 steps with a railing? : A Little 6 Click Score: 20    End of Session   Activity Tolerance: Patient tolerated treatment well Patient left: in bed;with bed alarm set;with call bell/phone within reach Nurse Communication: Mobility status PT Visit Diagnosis: Unsteadiness on feet (R26.81);Muscle weakness (generalized) (M62.81);Other abnormalities of gait and mobility (R26.89)    Time: 8338-2505 PT Time Calculation (min) (ACUTE ONLY): 16 min   Charges:   PT Evaluation $PT Eval Moderate Complexity: 1 Mod          Ichael Pullara A. Gilford Rile PT, DPT Acute Rehabilitation Services Office [phone]   Linna Hoff 11/10/2021, 5:11 PM

## 2021-11-11 ENCOUNTER — Encounter (HOSPITAL_COMMUNITY): Payer: Self-pay | Admitting: Student

## 2021-11-11 DIAGNOSIS — R112 Nausea with vomiting, unspecified: Secondary | ICD-10-CM | POA: Diagnosis not present

## 2021-11-11 DIAGNOSIS — I480 Paroxysmal atrial fibrillation: Secondary | ICD-10-CM | POA: Diagnosis not present

## 2021-11-11 DIAGNOSIS — Z8673 Personal history of transient ischemic attack (TIA), and cerebral infarction without residual deficits: Secondary | ICD-10-CM | POA: Diagnosis not present

## 2021-11-11 DIAGNOSIS — N179 Acute kidney failure, unspecified: Secondary | ICD-10-CM | POA: Diagnosis not present

## 2021-11-11 LAB — RENAL FUNCTION PANEL
Albumin: 2.6 g/dL — ABNORMAL LOW (ref 3.5–5.0)
Anion gap: 5 (ref 5–15)
BUN: 18 mg/dL (ref 8–23)
CO2: 21 mmol/L — ABNORMAL LOW (ref 22–32)
Calcium: 8.5 mg/dL — ABNORMAL LOW (ref 8.9–10.3)
Chloride: 114 mmol/L — ABNORMAL HIGH (ref 98–111)
Creatinine, Ser: 1.5 mg/dL — ABNORMAL HIGH (ref 0.44–1.00)
GFR, Estimated: 38 mL/min — ABNORMAL LOW (ref >60)
Glucose, Bld: 81 mg/dL (ref 70–99)
Phosphorus: 3 mg/dL (ref 2.5–4.6)
Potassium: 4 mmol/L (ref 3.5–5.1)
Sodium: 140 mmol/L (ref 135–145)

## 2021-11-11 LAB — CBC
HCT: 30.2 % — ABNORMAL LOW (ref 36.0–46.0)
Hemoglobin: 10 g/dL — ABNORMAL LOW (ref 12.0–15.0)
MCH: 28.2 pg (ref 26.0–34.0)
MCHC: 33.1 g/dL (ref 30.0–36.0)
MCV: 85.3 fL (ref 80.0–100.0)
Platelets: 341 10*3/uL (ref 150–400)
RBC: 3.54 MIL/uL — ABNORMAL LOW (ref 3.87–5.11)
RDW: 13.1 % (ref 11.5–15.5)
WBC: 5.1 10*3/uL (ref 4.0–10.5)
nRBC: 0 % (ref 0.0–0.2)

## 2021-11-11 LAB — RETICULOCYTES
Immature Retic Fract: 11.6 % (ref 2.3–15.9)
RBC.: 3.52 MIL/uL — ABNORMAL LOW (ref 3.87–5.11)
Retic Count, Absolute: 54.6 10*3/uL (ref 19.0–186.0)
Retic Ct Pct: 1.6 % (ref 0.4–3.1)

## 2021-11-11 LAB — MAGNESIUM: Magnesium: 1.8 mg/dL (ref 1.7–2.4)

## 2021-11-11 LAB — FERRITIN: Ferritin: 72 ng/mL (ref 11–307)

## 2021-11-11 LAB — IRON AND TIBC
Iron: 45 ug/dL (ref 28–170)
Saturation Ratios: 20 % (ref 10.4–31.8)
TIBC: 223 ug/dL — ABNORMAL LOW (ref 250–450)
UIBC: 178 ug/dL

## 2021-11-11 LAB — FOLATE: Folate: 9.3 ng/mL (ref >5.9)

## 2021-11-11 LAB — VITAMIN B12: Vitamin B-12: 776 pg/mL (ref 180–914)

## 2021-11-11 MED ORDER — MORPHINE SULFATE (PF) 2 MG/ML IV SOLN
2.0000 mg | INTRAVENOUS | Status: DC | PRN
  Administered 2021-11-11: 4 mg via INTRAVENOUS
  Administered 2021-11-12: 2 mg via INTRAVENOUS
  Filled 2021-11-11: qty 1
  Filled 2021-11-11: qty 2

## 2021-11-11 MED ORDER — SENNOSIDES-DOCUSATE SODIUM 8.6-50 MG PO TABS: 1.0000 | ORAL_TABLET | Freq: Two times a day (BID) | ORAL | Status: DC | PRN

## 2021-11-11 MED ORDER — POLYETHYLENE GLYCOL 3350 17 G PO PACK: 17.0000 g | PACK | Freq: Two times a day (BID) | ORAL | Status: DC | PRN

## 2021-11-11 NOTE — Progress Notes (Signed)
PROGRESS NOTE  Courtney Burke RKY:[id number] DOB: 11-16-1955   PCP: [name], PA-C  Patient is from: Home.  Lives with daughter.  Independently ambulates at baseline.  DOA: 11/08/2021 LOS: 2  Chief complaints Chief Complaint  Patient presents with   Abdominal Pain   Emesis     Brief Narrative / Interim history: 66 year old F with PMH of paroxysmal A-fib on Eliquis, left MCA CVA s/p left MCA stent in 06/2020, residual aphasia, CKD-3B, seizure disorder, anxiety, depression, HTN and HLD presenting with nausea and vomiting for about a week and intermittent abdominal pain for the same duration, and admitted for intractable nausea and vomiting and AKI.  Creatinine 2.2 (baseline 1.5).  CBC and UA without significant finding.  CT abdomen and pelvis without significant finding.  She was started on IV fluid and antiemetics and admitted.  Patient seems to be improving.  Likely home in the next 24 to 48 hours.  Subjective: Seen and examined earlier this morning.  No major events overnight of this morning.  Continues to endorse some nausea and epigastric pain.  No other complaints but not a great historian.  No family member at bedside.  Objective: Vitals:   11/10/21 2128 11/11/21 0358 11/11/21 0846 11/11/21 1616  BP: (!) 98/56 111/66 (!) 95/58 111/68  Pulse: 79 (!) 59 70 68  Resp: '18 16 16 16  '$ Temp: 98.1 F (36.7 C) 97.8 F (36.6 C) 97.8 F (36.6 C) 98.5 F (36.9 C)  TempSrc: Oral Oral Oral Oral  SpO2: 100% 99% 100% 98%  Weight:      Height:        Examination:  GENERAL: No apparent distress.  Nontoxic. HEENT: MMM.  Vision and hearing grossly intact.  NECK: Supple.  No apparent JVD.  RESP:  No IWOB.  Fair aeration bilaterally. CVS:  RRR. Heart sounds normal.  ABD/GI/GU: BS+. Abd soft.  Some epigastric tenderness. MSK/EXT:  Moves extremities. No apparent deformity. No edema.  SKIN: no apparent skin lesion or wound NEURO: Awake and alert.  Fairly oriented.   Follows commands.  She has some receptive and expressive aphasia from prior stroke.  Motor strength symmetric. PSYCH: Calm. Normal affect.   Procedures:  None  Microbiology summarized: None  Assessment and Plan: Intractable nausea and vomiting: unclear etiology of this.  She has no abdominal pain but mild diffuse tenderness.  She has no leukocytosis, fever or diarrhea.  CT A/P without acute finding.  She has history of sleeve gastrectomy.  Continues to endorse nausea and heaving but no witnessed emesis.  She has some epigastric pain and tenderness. -Continue IV fluid, scheduled Reglan and as needed Zofran. -Increased IV Protonix to 40 mg twice daily -Advance to soft diet -Monitor electrolytes and replenish as appropriate  AKI on CKD-3B: Likely prerenal in the setting of GI loss.  Improving with IV fluid. Recent Labs    05/15/21 2137 05/16/21 0230 05/17/21 0429 05/18/21 0251 06/01/21 1225 08/28/21 1316 11/08/21 1214 11/09/21 0305 11/10/21 0336 11/11/21 0214  BUN '16 13 16 17 11 20 '$ 37* 35* 26* 18  CREATININE 1.70* 1.39* 1.43* 1.35* 1.44* 1.53* 2.22* 1.90* 1.75* 1.50*  -Continue IV fluid hydration -Continue monitoring  History of CVA (cerebrovascular accident): has residual receptive and expressive aphasia.  S/p left MCA stent January 2022. -Continue Eliquis, aspirin, atorvastatin -PT/OT eval  Paroxysmal atrial fibrillation ([place]): in sinus rhythm.  Not on rate/rhythm control meds. -Continue Eliquis  Normocytic anemia: Initial Hgb higher than baseline likely hemoconcentration in the setting of  dehydration.   Recent Labs    05/12/21 0350 05/15/21 2129 05/15/21 2137 05/16/21 0230 05/17/21 0429 05/18/21 0251 06/01/21 1225 11/08/21 1214 11/10/21 0336 11/11/21 0214  HGB 10.7* 10.7* 10.5* 10.1* 10.0* 10.3* 10.8* 14.9 11.7* 10.0*  -Continue monitoring  Metabolic acidosis: In the setting of nausea and vomiting.  Improving. -Continue sodium bicarbonate  Hyponatremia:  Resolved.  Seizure disorder ([place]): Stable. -Continue home Keppra 1500 mg BID.  Hyperlipidemia, unspecified -Continue atorvastatin.  Essential hypertension: normotensive off home antihypertensive meds.  Reportedly not taking amlodipine and Diovan -Continue monitoring  DVT prophylaxis:   apixaban (ELIQUIS) tablet 5 mg  Code Status: Full code Family Communication: None at bedside.  Attempted to call patient's daughter but no answer. Level of care: Med-Surg Status is: Inpatient  The patient will remain inpatient because: Intractable nausea,  dehydration and AKI requiring IV fluid   Final disposition: Likely home with home health on 6/11. Consultants:  None  Sch Meds:  Scheduled Meds:  amitriptyline  25 mg Oral QHS   apixaban  5 mg Oral BID   aspirin EC  81 mg Oral Daily   atorvastatin  80 mg Oral Daily   levETIRAcetam  1,500 mg Oral BID   metoCLOPramide (REGLAN) injection  10 mg Intravenous Q8H   pantoprazole (PROTONIX) IV  40 mg Intravenous Q12H   sertraline  50 mg Oral Daily   sodium bicarbonate  650 mg Oral TID   sodium chloride flush  3 mL Intravenous Q12H   Continuous Infusions:  sodium chloride 100 mL/hr at 11/11/21 0955   PRN Meds:.acetaminophen **OR** acetaminophen, ondansetron **OR** ondansetron (ZOFRAN) IV, polyethylene glycol, senna-docusate  Antimicrobials: Anti-infectives (From admission, onward)    None        I have personally reviewed the following labs and images: CBC: Recent Labs  Lab 11/08/21 1214 11/10/21 0336 11/11/21 0214  WBC 6.8 6.1 5.1  NEUTROABS 3.9  --   --   HGB 14.9 11.7* 10.0*  HCT 44.7 33.5* 30.2*  MCV 84.3 82.5 85.3  PLT 609* 376 341   BMP &GFR Recent Labs  Lab 11/08/21 1214 11/09/21 0305 11/10/21 0336 11/11/21 0214  NA 135 133* 135 140  K 4.9 4.2 3.6 4.0  CL 109 108 112* 114*  CO2 15* 14* 19* 21*  GLUCOSE 150* 100* 93 81  BUN 37* 35* 26* 18  CREATININE 2.22* 1.90* 1.75* 1.50*  CALCIUM 9.9 9.0 8.6* 8.5*  MG   --  1.9 1.8 1.8  PHOS  --   --  3.5 3.0   Estimated Creatinine Clearance: 31.5 mL/min (A) (by C-G formula based on SCr of 1.5 mg/dL (H)). Liver & Pancreas: Recent Labs  Lab 11/08/21 1214 11/09/21 0305 11/10/21 0336 11/11/21 0214  AST 26 44*  --   --   ALT 15 19  --   --   ALKPHOS 165* 138*  --   --   BILITOT 1.0 0.8  --   --   PROT 7.6 6.3*  --   --   ALBUMIN 4.1 3.3* 2.9* 2.6*   Recent Labs  Lab 11/08/21 1214 11/10/21 0336  LIPASE 31 31   No results for input(s): "AMMONIA" in the last 168 hours. Diabetic: Recent Labs    11/09/21 0305  HGBA1C 5.4   Recent Labs  Lab 11/09/21 1125  GLUCAP 104*   Cardiac Enzymes: Recent Labs  Lab 11/10/21 0336  CKTOTAL 93   No results for input(s): "PROBNP" in the last 8760 hours. Coagulation Profile: No results  for input(s): "INR", "PROTIME" in the last 168 hours. Thyroid Function Tests: No results for input(s): "TSH", "T4TOTAL", "FREET4", "T3FREE", "THYROIDAB" in the last 72 hours. Lipid Profile: No results for input(s): "CHOL", "HDL", "LDLCALC", "TRIG", "CHOLHDL", "LDLDIRECT" in the last 72 hours. Anemia Panel: Recent Labs    11/11/21 0214  VITAMINB12 776  FOLATE 9.3  FERRITIN 72  TIBC 223*  IRON 45  RETICCTPCT 1.6   Urine analysis:    Component Value Date/Time   COLORURINE YELLOW 11/08/2021 1250   APPEARANCEUR HAZY (A) 11/08/2021 1250   LABSPEC 1.013 11/08/2021 1250   PHURINE 5.0 11/08/2021 1250   GLUCOSEU NEGATIVE 11/08/2021 1250   HGBUR SMALL (A) 11/08/2021 1250   BILIRUBINUR NEGATIVE 11/08/2021 1250   KETONESUR 5 (A) 11/08/2021 1250   PROTEINUR NEGATIVE 11/08/2021 1250   NITRITE NEGATIVE 11/08/2021 1250   LEUKOCYTESUR TRACE (A) 11/08/2021 1250   Sepsis Labs: Invalid input(s): "PROCALCITONIN", "LACTICIDVEN"  Microbiology: No results found for this or any previous visit (from the past 240 hour(s)).  Radiology Studies: No results found.    Adalia Pettis T. [place]  If 7PM-7AM, please  contact night-coverage [email or web address] 11/11/2021, 4:59 PM

## 2021-11-11 NOTE — Progress Notes (Signed)
Pt started a regular diet today.  She has vomited and having significant abd pain.  PRN Zofran given.  Dr. Alcario Drought on call made aware and PRN Morphine given.  Will make pt NPO x sips/chips for now. Ayesha Mohair BSN RN [place] 11/11/2021, 8:04 PM

## 2021-11-12 DIAGNOSIS — Z9889 Other specified postprocedural states: Secondary | ICD-10-CM

## 2021-11-12 DIAGNOSIS — Z8673 Personal history of transient ischemic attack (TIA), and cerebral infarction without residual deficits: Secondary | ICD-10-CM | POA: Diagnosis not present

## 2021-11-12 DIAGNOSIS — E876 Hypokalemia: Secondary | ICD-10-CM

## 2021-11-12 DIAGNOSIS — K219 Gastro-esophageal reflux disease without esophagitis: Secondary | ICD-10-CM

## 2021-11-12 DIAGNOSIS — Z9884 Bariatric surgery status: Secondary | ICD-10-CM

## 2021-11-12 DIAGNOSIS — K2970 Gastritis, unspecified, without bleeding: Secondary | ICD-10-CM

## 2021-11-12 DIAGNOSIS — N179 Acute kidney failure, unspecified: Secondary | ICD-10-CM | POA: Diagnosis not present

## 2021-11-12 DIAGNOSIS — K2960 Other gastritis without bleeding: Secondary | ICD-10-CM

## 2021-11-12 DIAGNOSIS — I480 Paroxysmal atrial fibrillation: Secondary | ICD-10-CM | POA: Diagnosis not present

## 2021-11-12 DIAGNOSIS — R112 Nausea with vomiting, unspecified: Secondary | ICD-10-CM | POA: Diagnosis not present

## 2021-11-12 DIAGNOSIS — K209 Esophagitis, unspecified without bleeding: Secondary | ICD-10-CM

## 2021-11-12 LAB — COMPREHENSIVE METABOLIC PANEL
ALT: 13 U/L (ref 0–44)
AST: 16 U/L (ref 15–41)
Albumin: 2.7 g/dL — ABNORMAL LOW (ref 3.5–5.0)
Alkaline Phosphatase: 117 U/L (ref 38–126)
Anion gap: 6 (ref 5–15)
BUN: 10 mg/dL (ref 8–23)
CO2: 18 mmol/L — ABNORMAL LOW (ref 22–32)
Calcium: 8.2 mg/dL — ABNORMAL LOW (ref 8.9–10.3)
Chloride: 115 mmol/L — ABNORMAL HIGH (ref 98–111)
Creatinine, Ser: 1.3 mg/dL — ABNORMAL HIGH (ref 0.44–1.00)
GFR, Estimated: 45 mL/min — ABNORMAL LOW (ref >60)
Glucose, Bld: 92 mg/dL (ref 70–99)
Potassium: 3.1 mmol/L — ABNORMAL LOW (ref 3.5–5.1)
Sodium: 139 mmol/L (ref 135–145)
Total Bilirubin: 0.5 mg/dL (ref 0.3–1.2)
Total Protein: 5.2 g/dL — ABNORMAL LOW (ref 6.5–8.1)

## 2021-11-12 LAB — CBC
HCT: 30 % — ABNORMAL LOW (ref 36.0–46.0)
Hemoglobin: 10.5 g/dL — ABNORMAL LOW (ref 12.0–15.0)
MCH: 29.2 pg (ref 26.0–34.0)
MCHC: 35 g/dL (ref 30.0–36.0)
MCV: 83.3 fL (ref 80.0–100.0)
Platelets: 370 10*3/uL (ref 150–400)
RBC: 3.6 MIL/uL — ABNORMAL LOW (ref 3.87–5.11)
RDW: 13.1 % (ref 11.5–15.5)
WBC: 6.2 10*3/uL (ref 4.0–10.5)
nRBC: 0 % (ref 0.0–0.2)

## 2021-11-12 LAB — TSH: TSH: 1.018 u[IU]/mL (ref 0.350–4.500)

## 2021-11-12 LAB — MAGNESIUM: Magnesium: 1.6 mg/dL — ABNORMAL LOW (ref 1.7–2.4)

## 2021-11-12 LAB — PHOSPHORUS: Phosphorus: 3.1 mg/dL (ref 2.5–4.6)

## 2021-11-12 LAB — APTT: aPTT: 46 seconds — ABNORMAL HIGH (ref 24–36)

## 2021-11-12 MED ORDER — METOCLOPRAMIDE HCL 5 MG/ML IJ SOLN
5.0000 mg | Freq: Three times a day (TID) | INTRAMUSCULAR | Status: DC
Start: 2021-11-12 — End: 2021-11-15
  Administered 2021-11-12 – 2021-11-15 (×11): 5 mg via INTRAVENOUS
  Filled 2021-11-12 (×10): qty 2

## 2021-11-12 MED ORDER — HEPARIN (PORCINE) 25000 UT/250ML-% IV SOLN
1300.0000 [IU]/h | INTRAVENOUS | Status: AC
  Administered 2021-11-12: 900 [IU]/h via INTRAVENOUS
  Administered 2021-11-13: 1300 [IU]/h via INTRAVENOUS
  Filled 2021-11-12 (×2): qty 250

## 2021-11-12 MED ORDER — MAGNESIUM SULFATE 2 GM/50ML IV SOLN
2.0000 g | Freq: Once | INTRAVENOUS | Status: AC
  Administered 2021-11-12: 2 g via INTRAVENOUS
  Filled 2021-11-12: qty 50

## 2021-11-12 MED ORDER — POTASSIUM CHLORIDE 20 MEQ PO PACK
40.0000 meq | PACK | ORAL | Status: AC
  Administered 2021-11-12 (×2): 40 meq via ORAL
  Filled 2021-11-12 (×2): qty 2

## 2021-11-12 MED ORDER — POTASSIUM CHLORIDE IN NACL 20-0.9 MEQ/L-% IV SOLN
INTRAVENOUS | Status: DC
  Filled 2021-11-12 (×6): qty 1000

## 2021-11-12 MED ORDER — SUCRALFATE 1 GM/10ML PO SUSP
1.0000 g | Freq: Two times a day (BID) | ORAL | Status: DC
  Administered 2021-11-12 – 2021-11-14 (×4): 1 g via ORAL
  Filled 2021-11-12 (×4): qty 10

## 2021-11-12 NOTE — Progress Notes (Signed)
Updated patient's daughter over the phone. 

## 2021-11-12 NOTE — Progress Notes (Signed)
Pharmacy Consult for heparin gtt  Indication: atrial fibrillation  Allergies  Allergen Reactions   Homatropine Itching   Iodinated Contrast Media Itching   Doxycycline Nausea And Vomiting   Sulfa Antibiotics Rash and Hives   Codeine Hives and Swelling    Swollen tongue   Hydrocodone Itching   Hydrocodone Bit-Homatrop Mbr Itching   Ace Inhibitors Cough, Itching and Rash    Patient Measurements: Height: '5\' 2"'$  (157.5 cm) Weight: 61.8 kg (136 lb 3.9 oz) IBW/kg (Calculated) : 50.1 Heparin Dosing Weight: HEPARIN DW (KG): 61.8  Vital Signs: Temp: 98 F (36.7 C) (06/11 1743) Temp Source: Oral (06/11 1743) BP: 124/66 (06/11 1743) Pulse Rate: 66 (06/11 1743)  Labs: Recent Labs    11/10/21 0336 11/11/21 0214 11/12/21 0450 11/12/21 2007  HGB 11.7* 10.0* 10.5*  --   HCT 33.5* 30.2* 30.0*  --   PLT 376 341 370  --   APTT  --   --   --  46*  CREATININE 1.75* 1.50* 1.30*  --   CKTOTAL 93  --   --   --      Estimated Creatinine Clearance: 36.3 mL/min (A) (by C-G formula based on SCr of 1.3 mg/dL (H)).  Assessment: Courtney Burke a 66 y.o. female presented with intractable nausea and vomiting/ Patient has a PMH of atrial fibrillation on eliquis, and pharmacy consulted to dose heparin prior to patient going to endoscopy. aPTT this afternoon subtherapeutic at 47. No signs of bleed noted. Most recent CBC stable.   Anticoagulation PTA: Patient taking apixaban (Eliquis) 5 mg BID PTA for atrial fibrillation. This will require aPTT/HL correlation before transitioning to only heparin level monitoring. Last dose inpatient 6/11 AM. CBC stable.   Goal of Therapy:  Goal aPTT 66-102 Heparin level 0.3-0.7 units/ml Monitor platelets by anticoagulation protocol: Yes   Plan:  Increase heparin to 1050 units/hr  Check aPTT in 6 hours  aPTT/heparin level/CBC daily Monitor for signs and symptoms of bleeding F/U post-endoscopy plans    Cephus Slater, PharmD, [facility]  Resident [phone] 11/12/2021 8:49 PM

## 2021-11-12 NOTE — Progress Notes (Signed)
PROGRESS NOTE  Courtney Burke DTO:[id number] DOB: 1956-05-30   PCP: [name], PA-C  Patient is from: Home.  Lives with daughter.  Independently ambulates at baseline.  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  Creatinine 2.2 (baseline 1.5).  CBC and UA without significant finding.  CT abdomen and pelvis without significant finding.  She was started on IV fluid and antiemetics and admitted.  Initially improved with scheduled Reglan and diet adjustment.  However, she had 2 episodes of emesis after advancing diet to soft.  GI consulted.  Subjective: Seen and examined earlier this morning.  Reportedly, she had 2 episodes of emesis and abdominal pain yesterday evening.  Diet discontinued.  She was given IV morphine with improvement in his symptoms.  Seems to be complaining emesis and abdominal pain but very difficult to understand due to her aphasia.  Objective: Vitals:   11/11/21 1616 11/11/21 2024 11/12/21 0530 11/12/21 0927  BP: 111/68 (!) 163/78 132/82 127/73  Pulse: 68 72 (!) 101 70  Resp: '16 18 20 18  '$ Temp: 98.5 F (36.9 C) 98.8 F (37.1 C) 98.2 F (36.8 C) 98.2 F (36.8 C)  TempSrc: Oral Oral Oral Oral  SpO2: 98% 100% 100% 99%  Weight:      Height:        Examination:  GENERAL: No apparent distress.  Nontoxic. HEENT: MMM.  Vision and hearing grossly intact.  NECK: Supple.  No apparent JVD.  RESP:  No IWOB.  Fair aeration bilaterally. CVS:  RRR.  Heart sounds normal.  ABD/GI/GU: BS+. Abd soft, NTND.  MSK/EXT:  Moves extremities. No apparent deformity. No edema.  SKIN: no apparent skin lesion or wound NEURO: Awake and alert.  Seems to be oriented.  Follows commands.  No focal neurodeficit other than aphasia. PSYCH: Calm. Normal affect.   Procedures:  None  Microbiology summarized: None  Assessment and Plan: Intractable nausea and vomiting: unclear etiology of this.  She has history of gastric bypass, esophagitis, gastritis.  She had epigastric tenderness that seems to have resolved.  She has no leukocytosis, fever or diarrhea.  CT A/P without acute finding other than previous partial gastrectomy.  She had 2 episodes of emesis and significant abdominal pain after advancing diet to soft yesterday. -Consult gastroenterology -Continue IV fluid.  Changed Reglan to ACHS.  Continue IV Zofran as needed -Continue IV Protonix to 40 mg twice daily -Clear liquid diet -Monitor electrolytes and replenish as appropriate  AKI on CKD-3B: Likely prerenal in the setting of GI loss.  AKI resolved. Recent Labs    05/16/21 0230 05/17/21 0429 05/18/21 0251 06/01/21 1225 08/28/21 1316 11/08/21 1214 11/09/21 0305 11/10/21 0336 11/11/21 0214 11/12/21 0450  BUN '13 16 17 11 20 '$ 37* 35* 26* 18 10  CREATININE 1.39* 1.43* 1.35* 1.44* 1.53* 2.22* 1.90* 1.75* 1.50* 1.30*  -Continue IV fluid hydration -Continue monitoring  History of CVA (cerebrovascular accident): has residual receptive and expressive aphasia.  S/p left MCA stent January 2022. -Continue Eliquis, aspirin, atorvastatin -PT/OT eval  Paroxysmal  atrial fibrillation ([place]): in sinus rhythm.  Not on rate/rhythm control meds. -Continue Eliquis  Normocytic anemia: Initial Hgb higher than baseline likely hemoconcentration in the setting of dehydration.   Recent Labs    05/15/21 2129 05/15/21 2137 05/16/21 0230 05/17/21 0429 05/18/21 0251 06/01/21 1225 11/08/21 1214 11/10/21 0336  11/11/21 0214 11/12/21 0450  HGB 10.7* 10.5* 10.1* 10.0* 10.3* 10.8* 14.9 11.7* 10.0* 10.5*  -Continue monitoring  Hypokalemia and hypomagnesemia: Likely from GI loss. -Replenish and recheck  Metabolic acidosis: In the setting of nausea and vomiting.  Improving. -Continue sodium bicarbonate  Hyponatremia: Resolved.  Seizure disorder ([place]): Stable. -Continue home Keppra 1500 mg BID.  Hyperlipidemia, unspecified -Continue atorvastatin.  Essential hypertension: normotensive off home antihypertensive meds.  Reportedly not taking amlodipine and Diovan -Continue monitoring  DVT prophylaxis:   apixaban (ELIQUIS) tablet 5 mg  Code Status: Full code Family Communication: None at bedside.  Attempted to call patient's daughter but no answer. Level of care: Med-Surg Status is: Inpatient  The patient will remain inpatient because: Intractable nausea,  dehydration and AKI requiring IV fluid   Final disposition: Likely home with home health on 6/11. Consultants:  None  Sch Meds:  Scheduled Meds:  amitriptyline  25 mg Oral QHS   apixaban  5 mg Oral BID   aspirin EC  81 mg Oral Daily   atorvastatin  80 mg Oral Daily   levETIRAcetam  1,500 mg Oral BID   metoCLOPramide (REGLAN) injection  5 mg Intravenous TID AC & HS   pantoprazole (PROTONIX) IV  40 mg Intravenous Q12H   potassium chloride  40 mEq Oral Q4H   sertraline  50 mg Oral Daily   sodium bicarbonate  650 mg Oral TID   sodium chloride flush  3 mL Intravenous Q12H   Continuous Infusions:  0.9 % NaCl with KCl 20 mEq / L 100 mL/hr at 11/12/21 1046   PRN Meds:.acetaminophen **OR** acetaminophen, morphine injection, ondansetron **OR** ondansetron (ZOFRAN) IV, polyethylene glycol, senna-docusate  Antimicrobials: Anti-infectives (From admission, onward)    None        I have personally reviewed the following labs and images: CBC: Recent Labs  Lab 11/08/21 1214 11/10/21 0336 11/11/21 0214 11/12/21 0450  WBC 6.8  6.1 5.1 6.2  NEUTROABS 3.9  --   --   --   HGB 14.9 11.7* 10.0* 10.5*  HCT 44.7 33.5* 30.2* 30.0*  MCV 84.3 82.5 85.3 83.3  PLT 609* 376 341 370   BMP &GFR Recent Labs  Lab 11/08/21 1214 11/09/21 0305 11/10/21 0336 11/11/21 0214 11/12/21 0450  NA 135 133* 135 140 139  K 4.9 4.2 3.6 4.0 3.1*  CL 109 108 112* 114* 115*  CO2 15* 14* 19* 21* 18*  GLUCOSE 150* 100* 93 81 92  BUN 37* 35* 26* 18 10  CREATININE 2.22* 1.90* 1.75* 1.50* 1.30*  CALCIUM 9.9 9.0 8.6* 8.5* 8.2*  MG  --  1.9 1.8 1.8 1.6*  PHOS  --   --  3.5 3.0 3.1   Estimated Creatinine Clearance: 36.3 mL/min (A) (by C-G formula based on SCr of 1.3 mg/dL (H)). Liver & Pancreas: Recent Labs  Lab 11/08/21 1214 11/09/21 0305 11/10/21 0336 11/11/21 0214 11/12/21 0450  AST 26 44*  --   --  16  ALT 15 19  --   --  13  ALKPHOS 165* 138*  --   --  117  BILITOT 1.0 0.8  --   --  0.5  PROT 7.6 6.3*  --   --  5.2*  ALBUMIN 4.1 3.3* 2.9* 2.6* 2.7*   Recent Labs  Lab 11/08/21 1214 11/10/21 0336  LIPASE 31 31   No results for input(s): "AMMONIA" in the last 168 hours. Diabetic: No results for input(s): "HGBA1C" in the last 72 hours.  Recent Labs  Lab 11/09/21 1125  GLUCAP 104*   Cardiac Enzymes: Recent Labs  Lab 11/10/21 0336  CKTOTAL 93   No results for input(s): "PROBNP" in the last 8760 hours. Coagulation Profile: No results for input(s): "INR", "PROTIME" in the last 168 hours. Thyroid Function Tests: No results for input(s): "TSH", "T4TOTAL", "FREET4", "T3FREE", "THYROIDAB" in the last 72 hours. Lipid Profile: No results for input(s): "CHOL", "HDL", "LDLCALC", "TRIG", "CHOLHDL", "LDLDIRECT" in the last 72 hours. Anemia Panel: Recent Labs    11/11/21 0214  VITAMINB12 776  FOLATE 9.3  FERRITIN 72  TIBC 223*  IRON 45  RETICCTPCT 1.6   Urine analysis:    Component Value Date/Time   COLORURINE YELLOW 11/08/2021 1250   APPEARANCEUR HAZY (A) 11/08/2021 1250   LABSPEC 1.013 11/08/2021 1250    PHURINE 5.0 11/08/2021 1250   GLUCOSEU NEGATIVE 11/08/2021 1250   HGBUR SMALL (A) 11/08/2021 1250   BILIRUBINUR NEGATIVE 11/08/2021 1250   KETONESUR 5 (A) 11/08/2021 1250   PROTEINUR NEGATIVE 11/08/2021 1250   NITRITE NEGATIVE 11/08/2021 1250   LEUKOCYTESUR TRACE (A) 11/08/2021 1250   Sepsis Labs: Invalid input(s): "PROCALCITONIN", "LACTICIDVEN"  Microbiology: No results found for this or any previous visit (from the past 240 hour(s)).  Radiology Studies: No results found.    Chanell Nadeau [facility] [place] [facility]  If 7PM-7AM, please contact night-coverage [email or web address] 11/12/2021, 12:03 PM

## 2021-11-12 NOTE — Progress Notes (Deleted)
Referring Provider: Dr. Cyndia Skeeters, [facility] Primary Care Physician:  [name], PA-C Primary Gastroenterologist:  Dr. Dorrene German  Reason for Consultation:  Intractable nausea and vomiting  HPI: Courtney Burke is a 66 y.o. 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  Admitted for intractable nausea and vomiting and AKI.  I spoke with her daughter by phone and she says that her mother was unable to keep down her pills so she was concerned since she was not getting her seizure and stroke medication, etc.     CT abdomen and pelvis without contrast without significant finding.     Initially improved with scheduled Reglan and diet adjustment.  However, she had 2 episodes of emesis after advancing diet to soft.  GI consulted.  Patient is unable to provide history due to expressive aphasia.   She is on pantoprazole 40 mg IV BID here but her daughter says that she was not on any acid reflux medication at home.  EGD 07/2019 by Dr. Jerene Pitch showed LA grade D esophagitis and gastric erosions, particularly several within the remaining gastric remnant.  Colonoscopy December 2017 by Dr. Shana Chute for colon cancer screening. Colonoscopy revealed mild tics of the sigmoid colon, internal hemorrhoids. Repeat was recommended in 05/2026 for colon cancer screening.    Past Medical History:  Diagnosis Date   Acute ischemic left MCA stroke ([place]) 06/30/2020   Acute ischemic stroke (HCC)    Acute stroke due to occlusion of left middle cerebral artery ([place]) 06/30/2020   Age-related nuclear cataract of both eyes 09/28/2015   Arthralgia of multiple joints 09/28/2015   Asthma    Borderline  diabetes    Cerebrovascular accident (CVA) ([place])    Chronic bilateral thoracic back pain 06/05/2018   Chronic renal insufficiency, stage 1 08/11/2014   Diabetes mellitus type 2 in obese ([place])    Dyslipidemia    Dysphagia, post-stroke    Erosive gastritis 07/20/2019   Formatting of this note might be different from the original. On EGD 07/2019   Esophagitis 07/20/2019   Formatting of this note might be different from the original. Added automatically from request for surgery [id number]  Formatting of this note might be different from the original. On EGD 07/2019   Essential hypertension 09/28/2015   Essential thrombocytosis ([place]) 09/28/2015   Gallstones 08/18/2019   Formatting of this note might be different from the original. Added automatically from request for surgery [id number]   Gastroesophageal reflux disease without esophagitis 09/28/2015   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   Hyperlipidemia, unspecified 03/16/2013   Hypertension    Keratoconjunctivitis sicca of both eyes not specified as Sjogren's 09/28/2015   Left middle cerebral artery stroke ([place]) 07/09/2020   Morbid obesity ([place]) 05/21/2013   Seizures ([place])    Stage 3b chronic kidney disease ([place])    Status post gastric bypass for obesity 09/28/2015    Past Surgical History:  Procedure Laterality Date   ANKLE SURGERY     CARPAL TUNNEL RELEASE     carpel tunnel     IR ANGIO INTRA EXTRACRAN SEL COM CAROTID INNOMINATE BILAT MOD SED  01/17/2021   IR ANGIO VERTEBRAL SEL SUBCLAVIAN INNOMINATE UNI R MOD SED  01/17/2021   IR CT HEAD  LTD  06/30/2020   IR INTRA CRAN STENT  06/30/2020   IR PERCUTANEOUS ART THROMBECTOMY/INFUSION INTRACRANIAL INC DIAG ANGIO  06/30/2020   IR RADIOLOGIST EVAL & MGMT  08/26/2020   IR US GUIDE VASC ACCESS RIGHT  01/17/2021   RADIOLOGY WITH ANESTHESIA N/A 06/30/2020   Procedure: RADIOLOGY WITH ANESTHESIA;  Surgeon: Radiologist, Medication, MD;  Location: [place];  Service: Radiology;  Laterality: N/A;    Prior  to Admission medications   Medication Sig Start Date End Date Taking? Authorizing Provider  acetaminophen (TYLENOL) 500 MG tablet Take 1,000 mg by mouth every 6 (six) hours as needed for moderate pain or headache.   Yes [provider]  apixaban (ELIQUIS) 5 MG TABS tablet TAKE 1 TABLET BY MOUTH TWICE A DAY Patient taking differently: Take 5 mg by mouth 2 (two) times daily. 07/10/21  Yes Camnitz, Ocie Doyne, MD  aspirin EC 81 MG tablet Take 1 tablet (81 mg total) by mouth daily. Swallow whole. 06/29/21  Yes McCue, Janett Billow, NP  atorvastatin (LIPITOR) 80 MG tablet Take 80 mg by mouth daily. 09/25/21  Yes [provider]  KLOR-CON M20 20 MEQ tablet Take 40 mEq by mouth 2 (two) times daily. 05/16/21  Yes [provider]  levETIRAcetam (KEPPRA) 750 MG tablet Take 2 tablets (1,500 mg total) by mouth 2 (two) times daily. 08/28/21  Yes Frann Rider, NP  Multiple Vitamin (MULTIVITAMIN WITH MINERALS) TABS tablet Take 1 tablet by mouth daily.   Yes [provider]  ondansetron (ZOFRAN-ODT) 4 MG disintegrating tablet Take 1 tablet (4 mg total) by mouth every 8 (eight) hours as needed for up to 10 doses for nausea or vomiting. 06/01/21  Yes Trifan, Carola Rhine, MD  sertraline (ZOLOFT) 50 MG tablet Take 50 mg by mouth daily. 08/16/21  Yes [provider]  ursodiol (ACTIGALL) 250 MG tablet Take 250 mg by mouth 2 (two) times daily. 07/28/20  Yes [provider]  amitriptyline (ELAVIL) 25 MG tablet TAKE 1 TABLET BY MOUTH EVERYDAY AT BEDTIME Patient not taking: Reported on 11/08/2021 07/24/21   Frann Rider, NP  amLODipine (NORVASC) 10 MG tablet Take by mouth. Patient not taking: Reported on 11/08/2021 04/11/21   [provider]  omeprazole (PRILOSEC) 40 MG capsule Take 1 capsule by mouth daily. Patient not taking: Reported on 11/08/2021 07/28/20   [provider]  Pseudoephedrine-APAP-DM (DAYQUIL PO) Take 1 capsule by mouth daily as needed (coldy  symptoms). Patient not taking: Reported on 11/08/2021    [provider]  valsartan (DIOVAN) 160 MG tablet Take by mouth. Patient not taking: Reported on 11/08/2021 07/06/18   [provider]    Current Facility-Administered Medications  Medication Dose Route Frequency Provider Last Rate Last Admin   0.9 % NaCl with KCl 20 mEq/ L  infusion   Intravenous Continuous Wendee Beavers T, MD 100 mL/hr at 11/12/21 1046 New Bag at 11/12/21 1046   acetaminophen (TYLENOL) tablet 650 mg  650 mg Oral Q6H PRN Lenore Cordia, MD       Or   acetaminophen (TYLENOL) suppository 650 mg  650 mg Rectal Q6H PRN Lenore Cordia, MD       amitriptyline (ELAVIL) tablet 25 mg  25 mg Oral QHS Zada Finders R, MD   25 mg at 11/10/21 2256   apixaban (ELIQUIS) tablet 5 mg  5 mg Oral BID Zada Finders R, MD   5 mg at 11/12/21 1058   aspirin EC tablet 81 mg  81 mg Oral  Daily Lenore Cordia, MD   81 mg at 11/12/21 1057   atorvastatin (LIPITOR) tablet 80 mg  80 mg Oral Daily Zada Finders R, MD   80 mg at 11/12/21 1057   levETIRAcetam (KEPPRA) tablet 1,500 mg  1,500 mg Oral BID Zada Finders R, MD   1,500 mg at 11/12/21 1100   metoCLOPramide (REGLAN) injection 5 mg  5 mg Intravenous TID AC & HS Wendee Beavers T, MD   5 mg at 11/12/21 0957   morphine (PF) 2 MG/ML injection 2-4 mg  2-4 mg Intravenous Q4H PRN Etta Quill, DO   2 mg at 11/12/21 0551   ondansetron (ZOFRAN) tablet 4 mg  4 mg Oral Q6H PRN Lenore Cordia, MD       Or   ondansetron [facility]) injection 4 mg  4 mg Intravenous Q6H PRN Lenore Cordia, MD   4 mg at 11/11/21 1940   pantoprazole (PROTONIX) injection 40 mg  40 mg Intravenous Q12H Wendee Beavers T, MD   40 mg at 11/12/21 1056   polyethylene glycol (MIRALAX / GLYCOLAX) packet 17 g  17 g Oral BID PRN Mercy Riding, MD       potassium chloride (KLOR-CON) packet 40 mEq  40 mEq Oral Q4H Wendee Beavers T, MD   40 mEq at 11/12/21 1058   senna-docusate (Senokot-S) tablet 1 tablet  1 tablet Oral BID PRN Mercy Riding, MD       sertraline (ZOLOFT) tablet 50 mg  50 mg Oral Daily Zada Finders R, MD   50 mg at 11/12/21 1058   sodium bicarbonate tablet 650 mg  650 mg Oral TID Wendee Beavers T, MD   650 mg at 11/12/21 1058   sodium chloride flush (NS) 0.9 % injection 3 mL  3 mL Intravenous Q12H Zada Finders R, MD   3 mL at 11/11/21 0957    Allergies as of 11/08/2021 - Review Complete 11/08/2021  Allergen Reaction Noted   Homatropine Itching 05/15/2017   Iodinated contrast media Itching 08/21/2019   Doxycycline Nausea And Vomiting 09/28/2015   Sulfa antibiotics Rash and Hives 03/16/2013   Codeine Hives and Swelling 09/28/2012   Hydrocodone Itching 08/16/2021   Hydrocodone bit-homatrop mbr Itching 05/15/2017   Ace inhibitors Cough, Itching, and Rash 09/28/2015    Family History  Problem Relation Age of Onset   Stroke Maternal Grandfather     Social History   Socioeconomic History   Marital status: Divorced    Spouse name: Not on file   Number of children: Not on file   Years of education: Not on file   Highest education level: Not on file  Occupational History   Not on file  Tobacco Use   Smoking status: Never   Smokeless tobacco: Never  Vaping Use   Vaping Use: Never used  Substance and Sexual Activity   Alcohol use: No   Drug use: No   Sexual activity: Not on file  Other Topics Concern   Not on file  Social History Narrative   Lives with daughter Courtney Burke   Right [place] no caffeine   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[place] Strain: [place]  (07/18/2021)   Overall Financial Resource Strain (CARDIA)    Difficulty of Paying Living Expenses: Not hard at all  Food Insecurity: No Food Insecurity (07/18/2021)  Hunger Vital Sign    Worried About Running Out of Food in the Last Year: Never true    Ran Out of Food in the Last Year: Never true  Transportation Needs: No  Transportation Needs (07/18/2021)   PRAPARE - Hydrologist (Medical): No    Lack of Transportation (Non-Medical): No  Physical Activity: Not on file  Stress: No Stress Concern Present (05/24/2021)   [place]    Feeling of Stress : Only a little  Social Connections: Moderately Integrated (05/24/2021)   Social Connection and Isolation Panel [NHANES]    Frequency of Communication with Friends and Family: More than three times a week    Frequency of Social Gatherings with Friends and Family: More than three times a week    Attends Religious Services: 1 to 4 times per year    Active Member of [employer] or Organizations: Yes    Attends Archivist Meetings: 1 to 4 times per year    Marital Status: Divorced  Human resources officer Violence: Not At Risk (06/30/2021)   Humiliation, Afraid, Rape, and Kick questionnaire    Fear of Current or Ex-Partner: No    Emotionally Abused: No    Physically Abused: No    Sexually Abused: No    Review of Systems: ROS is O/W negative except as mentioned in HPI.  Physical Exam: Vital signs in last 24 hours: Temp:  [98.2 F (36.8 C)-98.8 F (37.1 C)] 98.2 F (36.8 C) (06/11 0927) Pulse Rate:  [68-101] 70 (06/11 0927) Resp:  [16-20] 18 (06/11 0927) BP: (111-163)/(68-82) 127/73 (06/11 0927) SpO2:  [98 %-100 %] 99 % (06/11 0927) Last BM Date : 11/11/21 General:  Alert, Well-developed, well-nourished, pleasant and cooperative in NAD Head:  Normocephalic and atraumatic. Eyes:  Sclera clear, no icterus.  Conjunctiva pink. Ears:  Normal auditory acuity. Mouth:  No deformity or lesions.   Lungs:  Clear throughout to auscultation.  No wheezes, crackles, or rhonchi.  Heart:  Regular rate and rhythm; no murmurs, clicks, rubs, or gallops. Abdomen:  Soft, non-distended.  BS present.  Some diffuse TTP. Msk:  Symmetrical without gross deformities. Pulses:  Normal pulses  noted. Extremities:  Without clubbing or edema. Neurologic:  Alert.  Expressive aphasia. Skin:  Intact without significant lesions or rashes.  Intake/Output from previous day: 06/10 0701 - 06/11 0700 In: 2203.9 [P.O.:400; I.V.:1803.9] Out: -   Lab Results: Recent Labs    11/10/21 0336 11/11/21 0214 11/12/21 0450  WBC 6.1 5.1 6.2  HGB 11.7* 10.0* 10.5*  HCT 33.5* 30.2* 30.0*  PLT 376 341 370   BMET Recent Labs    11/10/21 0336 11/11/21 0214 11/12/21 0450  NA 135 140 139  K 3.6 4.0 3.1*  CL 112* 114* 115*  CO2 19* 21* 18*  GLUCOSE 93 81 92  BUN 26* 18 10  CREATININE 1.75* 1.50* 1.30*  CALCIUM 8.6* 8.5* 8.2*   LFT Recent Labs    11/12/21 0450  PROT 5.2*  ALBUMIN 2.7*  AST 16  ALT 13  ALKPHOS 117  BILITOT 0.5   IMPRESSION:  *Intractable nausea and vomiting as well as upper abdominal pain:  Had been unable to keep her pills down which is why her daughter brought her to the emergency department.  Initially improved with some Reglan, but then when trying to advance past clear liquids symptoms started again.  She has a history of a sleeve gastrectomy converted to gastric bypass.  Had last EGD in 07/2019 with esophagitis and several gastric erosions in her remnant.  Was not on any acid reflux medication at home. *AKI on CKD-3B *History of CVA (cerebrovascular accident): Has residual receptive and expressive aphasia.  S/p left MCA stent January 2022. *Paroxysmal atrial fibrillation: In sinus rhythm.  On Eliquis.  PLAN: -Continue Reglan 5 mg IV ACHS for now.  Continue IV Zofran as needed. -Continue IV Protonix to 40 mg twice daily. -Discussed with Dr. Cyndia Skeeters.  He is going to hold her Eliquis and place her on heparin gtt for now in anticipation for EGD on 6/13 after Eliquis washout.  I discussed the procedure with her daughter, Courtney Burke Alda Berthold) via phone and she was agreeable to proceed.  Laban Emperor. Keeon Zurn  11/12/2021, 12:02 PM

## 2021-11-12 NOTE — Progress Notes (Signed)
Pharmacy Consult for heparin gtt  Indication: atrial fibrillation  Allergies  Allergen Reactions   Homatropine Itching   Iodinated Contrast Media Itching   Doxycycline Nausea And Vomiting   Sulfa Antibiotics Rash and Hives   Codeine Hives and Swelling    Swollen tongue   Hydrocodone Itching   Hydrocodone Bit-Homatrop Mbr Itching   Ace Inhibitors Cough, Itching and Rash    Patient Measurements: Height: '5\' 2"'$  (157.5 cm) Weight: 61.8 kg (136 lb 3.9 oz) IBW/kg (Calculated) : 50.1 Heparin Dosing Weight: HEPARIN DW (KG): 61.8  Vital Signs: Temp: 98.2 F (36.8 C) (06/11 0927) Temp Source: Oral (06/11 0927) BP: 127/73 (06/11 0927) Pulse Rate: 70 (06/11 0927)  Labs: Recent Labs    11/10/21 0336 11/11/21 0214 11/12/21 0450  HGB 11.7* 10.0* 10.5*  HCT 33.5* 30.2* 30.0*  PLT 376 341 370  CREATININE 1.75* 1.50* 1.30*  CKTOTAL 93  --   --     Estimated Creatinine Clearance: 36.3 mL/min (A) (by C-G formula based on SCr of 1.3 mg/dL (H)).  Assessment: Labrittany Burke a 66 y.o. female presented with intractable nausea and vomiting/ Patient has a PMH of atrial fibrillation on eliquis, and pharmacy consulted to dose heparin prior to patient going to endoscopy.    Anticoagulation PTA: Patient taking apixaban (Eliquis) 5 mg BID PTA for atrial fibrillation. This will require aPTT/HL correlation before transitioning to only heparin level monitoring. Last dose inpatient 6/11 AM. CBC stable.   Goal of Therapy:  Heparin level 0.3-0.7 units/ml Monitor platelets by anticoagulation protocol: Yes   Plan:  Start heparin infusion at 1900 units/hour (no bolus)  Check aPTT in 6 hours  aPTT/heparin level/CBC daily Monitor for signs and symptoms of bleeding F/U post-endoscopy plans    Adria Dill, PharmD PGY-1 Acute Care Resident  11/12/2021 1:37 PM

## 2021-11-12 NOTE — Consult Note (Signed)
Referring Provider: Dr. Cyndia Skeeters, [facility] Primary Care Physician:  [name], PA-C Primary Gastroenterologist:  Dr. Dorrene German   Reason for Consultation:  Intractable nausea and vomiting   HPI: Courtney Burke is a 66 y.o. 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  Admitted for intractable nausea and vomiting and AKI.  I spoke with her daughter by phone and she says that her mother was unable to keep down her pills so she was concerned since she was not getting her seizure and stroke medication, etc.      CT abdomen and pelvis without contrast without significant finding.     Initially improved with scheduled Reglan and diet adjustment.  However, she had 2 episodes of emesis after advancing diet to soft.  GI consulted.   Patient is unable to provide history due to expressive aphasia.    She is on pantoprazole 40 mg IV BID here but her daughter says that she was not on any acid reflux medication at home.   EGD 07/2019 by Dr. Jerene Pitch showed LA grade D esophagitis and gastric erosions, particularly several within the remaining gastric remnant.   Colonoscopy December 2017 by Dr. Shana Chute for colon cancer screening. Colonoscopy revealed mild tics of the sigmoid colon, internal hemorrhoids. Repeat was recommended in 05/2026 for colon cancer screening.          Past Medical History:  Diagnosis Date   Acute ischemic left MCA stroke ([place]) 06/30/2020   Acute ischemic stroke (HCC)     Acute stroke due to occlusion of left middle cerebral artery ([place]) 06/30/2020   Age-related nuclear cataract of both eyes 09/28/2015   Arthralgia of multiple joints 09/28/2015   Asthma      Borderline diabetes     Cerebrovascular accident (CVA) ([place])     Chronic bilateral thoracic back pain 06/05/2018   Chronic renal insufficiency, stage 1 08/11/2014   Diabetes mellitus type 2 in obese ([place])     Dyslipidemia     Dysphagia, post-stroke     Erosive gastritis 07/20/2019    Formatting of this note might be different from the original. On EGD 07/2019   Esophagitis 07/20/2019    Formatting of this note might be different from the original. Added automatically from request for surgery [id number]  Formatting of this note might be different from the original. On EGD 07/2019   Essential hypertension 09/28/2015   Essential thrombocytosis ([place]) 09/28/2015   Gallstones 08/18/2019    Formatting of this note might be different from the original. 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   Hyperlipidemia, unspecified 03/16/2013   Hypertension     Keratoconjunctivitis sicca of both eyes not specified as Sjogren's 09/28/2015   Left middle cerebral artery stroke ([place]) 07/09/2020   Morbid obesity ([place]) 05/21/2013   Seizures ([place])     Stage 3b chronic kidney disease ([place])     Status post gastric bypass for obesity 09/28/2015           Past Surgical History:  Procedure Laterality Date   ANKLE SURGERY       CARPAL TUNNEL RELEASE  carpel tunnel       IR ANGIO INTRA EXTRACRAN SEL COM CAROTID INNOMINATE BILAT MOD SED   01/17/2021   IR ANGIO VERTEBRAL SEL SUBCLAVIAN INNOMINATE UNI R MOD SED   01/17/2021   IR CT HEAD LTD   06/30/2020   IR INTRA CRAN STENT   06/30/2020   IR PERCUTANEOUS ART THROMBECTOMY/INFUSION INTRACRANIAL INC DIAG ANGIO   06/30/2020   IR RADIOLOGIST EVAL & MGMT   08/26/2020   IR US GUIDE VASC ACCESS RIGHT   01/17/2021   RADIOLOGY WITH ANESTHESIA N/A 06/30/2020    Procedure: RADIOLOGY WITH ANESTHESIA;  Surgeon: Radiologist, Medication, MD;  Location: [place];   Service: Radiology;  Laterality: N/A;             Prior to Admission medications   Medication Sig Start Date End Date Taking? Authorizing Provider  acetaminophen (TYLENOL) 500 MG tablet Take 1,000 mg by mouth every 6 (six) hours as needed for moderate pain or headache.     Yes [provider]  apixaban (ELIQUIS) 5 MG TABS tablet TAKE 1 TABLET BY MOUTH TWICE A DAY Patient taking differently: Take 5 mg by mouth 2 (two) times daily. 07/10/21   Yes Camnitz, Ocie Doyne, MD  aspirin EC 81 MG tablet Take 1 tablet (81 mg total) by mouth daily. Swallow whole. 06/29/21   Yes McCue, Janett Billow, NP  atorvastatin (LIPITOR) 80 MG tablet Take 80 mg by mouth daily. 09/25/21   Yes [provider]  KLOR-CON M20 20 MEQ tablet Take 40 mEq by mouth 2 (two) times daily. 05/16/21   Yes [provider]  levETIRAcetam (KEPPRA) 750 MG tablet Take 2 tablets (1,500 mg total) by mouth 2 (two) times daily. 08/28/21   Yes Frann Rider, NP  Multiple Vitamin (MULTIVITAMIN WITH MINERALS) TABS tablet Take 1 tablet by mouth daily.     Yes [provider]  ondansetron (ZOFRAN-ODT) 4 MG disintegrating tablet Take 1 tablet (4 mg total) by mouth every 8 (eight) hours as needed for up to 10 doses for nausea or vomiting. 06/01/21   Yes Trifan, Carola Rhine, MD  sertraline (ZOLOFT) 50 MG tablet Take 50 mg by mouth daily. 08/16/21   Yes [provider]  ursodiol (ACTIGALL) 250 MG tablet Take 250 mg by mouth 2 (two) times daily. 07/28/20   Yes [provider]  amitriptyline (ELAVIL) 25 MG tablet TAKE 1 TABLET BY MOUTH EVERYDAY AT BEDTIME Patient not taking: Reported on 11/08/2021 07/24/21     Frann Rider, NP  amLODipine (NORVASC) 10 MG tablet Take by mouth. Patient not taking: Reported on 11/08/2021 04/11/21     [provider]  omeprazole (PRILOSEC) 40 MG capsule Take 1 capsule by mouth daily. Patient not taking: Reported on 11/08/2021 07/28/20     [provider]   Pseudoephedrine-APAP-DM (DAYQUIL PO) Take 1 capsule by mouth daily as needed (coldy symptoms). Patient not taking: Reported on 11/08/2021       [provider]  valsartan (DIOVAN) 160 MG tablet Take by mouth. Patient not taking: Reported on 11/08/2021 07/06/18     [provider]               Current Facility-Administered Medications  Medication Dose Route Frequency Provider Last Rate Last Admin   0.9 % NaCl with KCl 20 mEq/ L  infusion   Intravenous Continuous Wendee Beavers T, MD 100 mL/hr at 11/12/21 1046 New Bag at 11/12/21 1046   acetaminophen (TYLENOL) tablet 650 mg  650 mg Oral  Q6H PRN Lenore Cordia, MD        Or   acetaminophen (TYLENOL) suppository 650 mg  650 mg Rectal Q6H PRN Lenore Cordia, MD       amitriptyline (ELAVIL) tablet 25 mg  25 mg Oral QHS Zada Finders R, MD   25 mg at 11/10/21 2256   apixaban (ELIQUIS) tablet 5 mg  5 mg Oral BID Zada Finders R, MD   5 mg at 11/12/21 1058   aspirin EC tablet 81 mg  81 mg Oral Daily Lenore Cordia, MD   81 mg at 11/12/21 1057   atorvastatin (LIPITOR) tablet 80 mg  80 mg Oral Daily Zada Finders R, MD   80 mg at 11/12/21 1057   levETIRAcetam (KEPPRA) tablet 1,500 mg  1,500 mg Oral BID Zada Finders R, MD   1,500 mg at 11/12/21 1100   metoCLOPramide (REGLAN) injection 5 mg  5 mg Intravenous TID AC & HS Wendee Beavers T, MD   5 mg at 11/12/21 0957   morphine (PF) 2 MG/ML injection 2-4 mg  2-4 mg Intravenous Q4H PRN Etta Quill, DO   2 mg at 11/12/21 0551   ondansetron (ZOFRAN) tablet 4 mg  4 mg Oral Q6H PRN Lenore Cordia, MD        Or   ondansetron (ZOFRAN) injection 4 mg  4 mg Intravenous Q6H PRN Lenore Cordia, MD   4 mg at 11/11/21 1940   pantoprazole (PROTONIX) injection 40 mg  40 mg Intravenous Q12H Wendee Beavers T, MD   40 mg at 11/12/21 1056   polyethylene glycol (MIRALAX / GLYCOLAX) packet 17 g  17 g Oral BID PRN Wendee Beavers T, MD       potassium chloride (KLOR-CON) packet 40 mEq  40 mEq Oral Q4H Gonfa, Taye T,  MD   40 mEq at 11/12/21 1058   senna-docusate (Senokot-S) tablet 1 tablet  1 tablet Oral BID PRN Mercy Riding, MD       sertraline (ZOLOFT) tablet 50 mg  50 mg Oral Daily Zada Finders R, MD   50 mg at 11/12/21 1058   sodium bicarbonate tablet 650 mg  650 mg Oral TID Wendee Beavers T, MD   650 mg at 11/12/21 1058   sodium chloride flush (NS) 0.9 % injection 3 mL  3 mL Intravenous Q12H Zada Finders R, MD   3 mL at 11/11/21 0957           Allergies as of 11/08/2021 - Review Complete 11/08/2021  Allergen Reaction Noted   Homatropine Itching 05/15/2017   Iodinated contrast media Itching 08/21/2019   Doxycycline Nausea And Vomiting 09/28/2015   Sulfa antibiotics Rash and Hives 03/16/2013   Codeine Hives and Swelling 09/28/2012   Hydrocodone Itching 08/16/2021   Hydrocodone bit-homatrop mbr Itching 05/15/2017   Ace inhibitors Cough, Itching, and Rash 09/28/2015           Family History  Problem Relation Age of Onset   Stroke Maternal Grandfather        Social History         Socioeconomic History   Marital status: Divorced      Spouse name: Not on file   Number of children: Not on file   Years of education: Not on file   Highest education level: Not on file  Occupational History   Not on file  Tobacco Use   Smoking status: Never   Smokeless tobacco: Never  Vaping Use  Vaping Use: Never used  Substance and Sexual Activity   Alcohol use: No   Drug use: No   Sexual activity: Not on file  Other Topics Concern   Not on file  Social History Narrative    Lives with daughter [name]    Right Merna no caffeine    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[place] Strain: [place]  (07/18/2021)    Overall Financial Resource Strain (CARDIA)     Difficulty of Paying Living Expenses: Not hard at all  Food Insecurity: No Food Insecurity (07/18/2021)    Hunger Vital Sign      Worried About Running Out of Food in the Last Year: Never true     [place] in the Last Year: Never true  Transportation Needs: No Transportation Needs (07/18/2021)    PRAPARE - Armed forces logistics/support/administrative officer (Medical): No     Lack of Transportation (Non-Medical): No  Physical Activity: Not on file  Stress: No Stress Concern Present (05/24/2021)    [place]     Feeling of Stress : Only a little  Social Connections: Moderately Integrated (05/24/2021)    Social Connection and Isolation Panel [NHANES]     Frequency of Communication with Friends and Family: More than three times a week     Frequency of Social Gatherings with Friends and Family: More than three times a week     Attends Religious Services: 1 to 4 times per year     Active Member of [employer] or Organizations: Yes     Attends Archivist Meetings: 1 to 4 times per year     Marital Status: Divorced  Human resources officer Violence: Not At Risk (06/30/2021)    Humiliation, Afraid, Rape, and Kick questionnaire     Fear of Current or Ex-Partner: No     Emotionally Abused: No     Physically Abused: No     Sexually Abused: No      Review of Systems: ROS is O/W negative except as mentioned in HPI.   Physical Exam: Vital signs in last 24 hours: Temp:  [98.2 F (36.8 C)-98.8 F (37.1 C)] 98.2 F (36.8 C) (06/11 0927) Pulse Rate:  [68-101] 70 (06/11 0927) Resp:  [16-20] 18 (06/11 0927) BP: (111-163)/(68-82) 127/73 (06/11 0927) SpO2:  [98 %-100 %] 99 % (06/11 0927) Last BM Date : 11/11/21 General:  Alert, Well-developed, well-nourished, pleasant and cooperative in NAD Head:  Normocephalic and atraumatic. Eyes:  Sclera clear, no icterus.  Conjunctiva pink. Ears:  Normal auditory acuity. Mouth:  No deformity or lesions.   Lungs:  Clear throughout to auscultation.  No wheezes, crackles, or rhonchi.  Heart:  Regular rate and rhythm; no  murmurs, clicks, rubs, or gallops. Abdomen:  Soft, non-distended.  BS present.  Some diffuse TTP. Msk:  Symmetrical without gross deformities. Pulses:  Normal pulses noted. Extremities:  Without clubbing or edema. Neurologic:  Alert.  Expressive aphasia. Skin:  Intact without significant lesions or rashes.   Intake/Output from previous day: 06/10 0701 - 06/11 0700 In: 2203.9 [P.O.:400; I.V.:1803.9] Out: -    Lab Results: Recent Labs (last 2 labs)        Recent Labs    11/10/21 0336 11/11/21 0214 11/12/21 0450  WBC 6.1 5.1 6.2  HGB 11.7* 10.0* 10.5*  HCT 33.5* 30.2* 30.0*  PLT 376 341 370      BMET Recent Labs (last 2 labs)        Recent Labs    11/10/21 0336 11/11/21 0214 11/12/21 0450  NA 135 140 139  K 3.6 4.0 3.1*  CL 112* 114* 115*  CO2 19* 21* 18*  GLUCOSE 93 81 92  BUN 26* 18 10  CREATININE 1.75* 1.50* 1.30*  CALCIUM 8.6* 8.5* 8.2*      LFT Recent Labs (last 2 labs)      Recent Labs    11/12/21 0450  PROT 5.2*  ALBUMIN 2.7*  AST 16  ALT 13  ALKPHOS 117  BILITOT 0.5      IMPRESSION:  *Intractable nausea and vomiting as well as upper abdominal pain:  Had been unable to keep her pills down which is why her daughter brought her to the emergency department.  Initially improved with some Reglan, but then when trying to advance past clear liquids symptoms started again.  She has a history of a sleeve gastrectomy converted to gastric bypass.  Had last EGD in 07/2019 with esophagitis and several gastric erosions in her remnant.  Was not on any acid reflux medication at home. *AKI on CKD-3B *History of CVA (cerebrovascular accident): Has residual receptive and expressive aphasia.  S/p left MCA stent January 2022. *Paroxysmal atrial fibrillation: In sinus rhythm.  On Eliquis.   PLAN: -Continue Reglan 5 mg IV ACHS for now.  Continue IV Zofran as needed. -Continue IV Protonix to 40 mg twice daily. -Discussed with Dr. Cyndia Skeeters.  He is going to hold her Eliquis  and place her on heparin gtt for now in anticipation for EGD on 6/13 after Eliquis washout.  I discussed the procedure with her daughter, [name] Alda Berthold) via phone and she was agreeable to proceed.   Laban Emperor. Char Feltman  11/12/2021, 12:02 PM

## 2021-11-13 DIAGNOSIS — R112 Nausea with vomiting, unspecified: Secondary | ICD-10-CM | POA: Diagnosis not present

## 2021-11-13 DIAGNOSIS — R1115 Cyclical vomiting syndrome unrelated to migraine: Secondary | ICD-10-CM

## 2021-11-13 DIAGNOSIS — I480 Paroxysmal atrial fibrillation: Secondary | ICD-10-CM | POA: Diagnosis not present

## 2021-11-13 DIAGNOSIS — N179 Acute kidney failure, unspecified: Secondary | ICD-10-CM | POA: Diagnosis not present

## 2021-11-13 DIAGNOSIS — Z8673 Personal history of transient ischemic attack (TIA), and cerebral infarction without residual deficits: Secondary | ICD-10-CM | POA: Diagnosis not present

## 2021-11-13 LAB — CBC
HCT: 29.9 % — ABNORMAL LOW (ref 36.0–46.0)
Hemoglobin: 10.1 g/dL — ABNORMAL LOW (ref 12.0–15.0)
MCH: 28.8 pg (ref 26.0–34.0)
MCHC: 33.8 g/dL (ref 30.0–36.0)
MCV: 85.2 fL (ref 80.0–100.0)
Platelets: 376 10*3/uL (ref 150–400)
RBC: 3.51 MIL/uL — ABNORMAL LOW (ref 3.87–5.11)
RDW: 13.2 % (ref 11.5–15.5)
WBC: 4.2 10*3/uL (ref 4.0–10.5)
nRBC: 0 % (ref 0.0–0.2)

## 2021-11-13 LAB — RENAL FUNCTION PANEL
Albumin: 2.5 g/dL — ABNORMAL LOW (ref 3.5–5.0)
Anion gap: 4 — ABNORMAL LOW (ref 5–15)
BUN: 7 mg/dL — ABNORMAL LOW (ref 8–23)
CO2: 19 mmol/L — ABNORMAL LOW (ref 22–32)
Calcium: 8.4 mg/dL — ABNORMAL LOW (ref 8.9–10.3)
Chloride: 119 mmol/L — ABNORMAL HIGH (ref 98–111)
Creatinine, Ser: 1.36 mg/dL — ABNORMAL HIGH (ref 0.44–1.00)
GFR, Estimated: 43 mL/min — ABNORMAL LOW (ref >60)
Glucose, Bld: 76 mg/dL (ref 70–99)
Phosphorus: 2.5 mg/dL (ref 2.5–4.6)
Potassium: 4.4 mmol/L (ref 3.5–5.1)
Sodium: 142 mmol/L (ref 135–145)

## 2021-11-13 LAB — APTT
aPTT: 31 seconds (ref 24–36)
aPTT: 78 seconds — ABNORMAL HIGH (ref 24–36)

## 2021-11-13 LAB — MAGNESIUM: Magnesium: 1.9 mg/dL (ref 1.7–2.4)

## 2021-11-13 LAB — HEPARIN LEVEL (UNFRACTIONATED): Heparin Unfractionated: 0.67 IU/mL (ref 0.30–0.70)

## 2021-11-13 LAB — CORTISOL-AM, BLOOD: Cortisol - AM: 3.8 ug/dL — ABNORMAL LOW (ref 6.7–22.6)

## 2021-11-13 NOTE — Progress Notes (Signed)
Pharmacy Consult for heparin gtt  Indication: atrial fibrillation  Allergies  Allergen Reactions   Homatropine Itching   Iodinated Contrast Media Itching   Doxycycline Nausea And Vomiting   Sulfa Antibiotics Rash and Hives   Codeine Hives and Swelling    Swollen tongue   Hydrocodone Itching   Hydrocodone Bit-Homatrop Mbr Itching   Ace Inhibitors Cough, Itching and Rash    Patient Measurements: Height: '5\' 2"'$  (157.5 cm) Weight: 61.8 kg (136 lb 3.9 oz) IBW/kg (Calculated) : 50.1 Heparin Dosing Weight: HEPARIN DW (KG): 61.8  Vital Signs: Temp: 98.1 F (36.7 C) (06/12 0940) Temp Source: Oral (06/12 0940) BP: 146/82 (06/12 0940) Pulse Rate: 60 (06/12 0940)  Labs: Recent Labs    11/11/21 0214 11/12/21 0450 11/12/21 2007 11/13/21 0439 11/13/21 1454  HGB 10.0* 10.5*  --  10.1*  --   HCT 30.2* 30.0*  --  29.9*  --   PLT 341 370  --  376  --   APTT  --   --  46* 31 78*  HEPARINUNFRC  --   --   --  0.67  --   CREATININE 1.50* 1.30*  --  1.36*  --      Estimated Creatinine Clearance: 34.7 mL/min (A) (by C-G formula based on SCr of 1.36 mg/dL (H)).  Assessment: Courtney Burke a 66 y.o. female presented with intractable nausea and vomiting/ Patient has a PMH of atrial fibrillation on eliquis, and pharmacy consulted to dose heparin prior to patient going to endoscopy. aPTT this afternoon therapeutic at 78. No signs of bleed noted. Most recent CBC stable.   Anticoagulation PTA: Patient taking apixaban (Eliquis) 5 mg BID PTA for atrial fibrillation. This will require aPTT/HL correlation before transitioning to only heparin level monitoring. Last dose inpatient 6/11 AM. CBC stable.   Goal of Therapy:  Goal aPTT 66-102 Heparin level 0.3-0.7 units/ml Monitor platelets by anticoagulation protocol: Yes   Plan:  Continue heparin at 1300 units/hr  Hold heparin for EGD at 0300 in am 6/13 F/u restart of apixaban or heparin post procedure aPTT/heparin level/CBC daily Monitor  for signs and symptoms of bleeding   Haiden Rawlinson A. Levada Dy, PharmD, BCPS, FNKF Clinical Pharmacist [place] Please utilize Amion for appropriate phone number to reach the unit pharmacist ([place])

## 2021-11-13 NOTE — Care Management Important Message (Signed)
Important Message  Patient Details  Name: Courtney Burke MRN: [id number] Date of Birth: [birth year]   Medicare Important Message Given:  Yes     Amalee Olsen Montine Circle 11/13/2021, 4:04 PM

## 2021-11-13 NOTE — Progress Notes (Signed)
PROGRESS NOTE  Courtney Burke YFV:[id number] DOB: 22-Jan-1956   PCP: [name], PA-C  Patient is from: Home.  Lives with daughter.  Independently ambulates at baseline.  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  Creatinine 2.2 (baseline 1.5).  CBC and UA without significant finding.  CT abdomen and pelvis without significant finding.  She was started on IV fluid and antiemetics and admitted.  Patient had episodes of emesis and abdominal pain after interval improvement.  GI consulted and planning EGD on 6/13.  Subjective: Seen and examined earlier this morning.  No major events overnight of this morning.  Has no further emesis but she reports nausea.  She also reports abdominal pain from epigastrium to suprapubic area.  She is not a great historian due to CVA and residual aphasia.  Objective: Vitals:   11/12/21 1743 11/12/21 2057 11/13/21 0544 11/13/21 0940  BP: 124/66 126/64 (!) 153/78 (!) 146/82  Pulse: 66 70 (!) 57 60  Resp: '16 18 18 17  '$ Temp: 98 F (36.7 C) 98.9 F (37.2 C) (!) 97.4 F (36.3 C) 98.1 F (36.7 C)  TempSrc: Oral Oral Oral Oral  SpO2: 100% 98% 100% 100%  Weight:      Height:        Examination:  GENERAL: No apparent distress.  Nontoxic. HEENT: MMM.  Vision and hearing grossly intact.  NECK: Supple.  No apparent JVD.  RESP:  No IWOB.  Fair aeration bilaterally. CVS:  RRR. Heart sounds normal.  ABD/GI/GU: BS+. Abd soft, NTND.   MSK/EXT:  Moves extremities. No apparent deformity. No edema.  SKIN: no apparent skin lesion or wound NEURO: Awake and alert. Oriented fairly.  Follows commands.  Aphasia.  No apparent focal neuro deficit. PSYCH: Appears anxious.  Procedures:  None  Microbiology summarized: None  Assessment and Plan: Intractable nausea, vomiting and abdominal pain: unclear etiology of this.  She has history of gastric bypass, esophagitis, gastritis.  She has no leukocytosis, fever or diarrhea.  CT A/P without acute finding other than previous partial gastrectomy.  Had emesis and abdominal pain after advancing diet to soft on 6/10.  Epigastric tenderness for most part. -GI consulted.  Plan for EGD. -Continue IV fluid.  Changed Reglan to ACHS.  Continue IV Zofran as needed -Continue IV Protonix to 40 mg twice daily -Continue clear liquid diet -Monitor electrolytes and replenish as appropriate  AKI on CKD-3B: Likely prerenal in the setting of GI loss.  AKI resolved. Recent Labs    05/17/21 0429 05/18/21 0251 06/01/21 1225 08/28/21 1316 11/08/21 1214 11/09/21 0305 11/10/21 0336 11/11/21 0214 11/12/21 0450 11/13/21 0439  BUN '16 17 11 20 '$ 37* 35* 26* 18 10 7*  CREATININE 1.43* 1.35* 1.44* 1.53* 2.22* 1.90* 1.75* 1.50* 1.30* 1.36*  -Continue IV fluid hydration -Continue monitoring  History of CVA (cerebrovascular accident): has residual receptive and expressive aphasia.  S/p left MCA stent January 2022. -Continue Eliquis, aspirin, atorvastatin -PT/OT eval  Paroxysmal atrial fibrillation ([place]): in sinus rhythm.  Not on rate/rhythm control meds. -  Continue Eliquis  Normocytic anemia: Initial Hgb higher than baseline likely hemoconcentration in the setting of dehydration.   Recent Labs    05/15/21 2137 05/16/21 0230 05/17/21 0429 05/18/21 0251 06/01/21 1225 11/08/21 1214 11/10/21 0336 11/11/21 0214 11/12/21 0450 11/13/21 0439  HGB 10.5* 10.1* 10.0* 10.3* 10.8* 14.9 11.7* 10.0* 10.5*  10.1*  -Continue monitoring  Hypokalemia and hypomagnesemia: Likely from GI loss. -Replenish and recheck  Metabolic acidosis: In the setting of nausea and vomiting and IV fluid..  Stable. -Continue sodium bicarbonate  Hyponatremia: Resolved.  Seizure disorder ([place]): Stable. -Continue home Keppra 1500 mg BID.  Hyperlipidemia, unspecified -Continue atorvastatin.  Essential hypertension: normotensive off home antihypertensive meds for most part.  Reportedly not taking amlodipine and Diovan -Continue monitoring  DVT prophylaxis:  On IV heparin for anticoagulation.  Code Status: Full code Family Communication: Updated patient's daughter over the phone on 6/11. Level of care: Med-Surg Status is: Inpatient  The patient will remain inpatient because: Intractable nausea and vomiting, abdominal pain, dehydration and AKI requiring IV fluid   Final disposition: Likely home once medically clear. Consultants:  Gastroenterology  Sch Meds:  Scheduled Meds:  amitriptyline  25 mg Oral QHS   aspirin EC  81 mg Oral Daily   atorvastatin  80 mg Oral Daily   levETIRAcetam  1,500 mg Oral BID   metoCLOPramide (REGLAN) injection  5 mg Intravenous TID AC & HS   pantoprazole (PROTONIX) IV  40 mg Intravenous Q12H   sertraline  50 mg Oral Daily   sodium bicarbonate  650 mg Oral TID   sodium chloride flush  3 mL Intravenous Q12H   sucralfate  1 g Oral BID   Continuous Infusions:  0.9 % NaCl with KCl 20 mEq / L 100 mL/hr at 11/13/21 1100   heparin 1,300 Units/hr (11/13/21 1100)   PRN Meds:.acetaminophen **OR** acetaminophen, morphine injection, ondansetron **OR** ondansetron (ZOFRAN) IV, polyethylene glycol, senna-docusate  Antimicrobials: Anti-infectives (From admission, onward)    None        I have personally reviewed the following labs and images: CBC: Recent Labs  Lab 11/08/21 1214 11/10/21 0336 11/11/21 0214 11/12/21 0450 11/13/21 0439  WBC 6.8 6.1 5.1 6.2 4.2   NEUTROABS 3.9  --   --   --   --   HGB 14.9 11.7* 10.0* 10.5* 10.1*  HCT 44.7 33.5* 30.2* 30.0* 29.9*  MCV 84.3 82.5 85.3 83.3 85.2  PLT 609* 376 341 370 376   BMP &GFR Recent Labs  Lab 11/09/21 0305 11/10/21 0336 11/11/21 0214 11/12/21 0450 11/13/21 0439  NA 133* 135 140 139 142  K 4.2 3.6 4.0 3.1* 4.4  CL 108 112* 114* 115* 119*  CO2 14* 19* 21* 18* 19*  GLUCOSE 100* 93 81 92 76  BUN 35* 26* 18 10 7*  CREATININE 1.90* 1.75* 1.50* 1.30* 1.36*  CALCIUM 9.0 8.6* 8.5* 8.2* 8.4*  MG 1.9 1.8 1.8 1.6* 1.9  PHOS  --  3.5 3.0 3.1 2.5   Estimated Creatinine Clearance: 34.7 mL/min (A) (by C-G formula based on SCr of 1.36 mg/dL (H)). Liver & Pancreas: Recent Labs  Lab 11/08/21 1214 11/09/21 0305 11/10/21 0336 11/11/21 0214 11/12/21 0450 11/13/21 0439  AST 26 44*  --   --  16  --   ALT 15 19  --   --  13  --   ALKPHOS 165* 138*  --   --  117  --   BILITOT 1.0 0.8  --   --  0.5  --  PROT 7.6 6.3*  --   --  5.2*  --   ALBUMIN 4.1 3.3* 2.9* 2.6* 2.7* 2.5*   Recent Labs  Lab 11/08/21 1214 11/10/21 0336  LIPASE 31 31   No results for input(s): "AMMONIA" in the last 168 hours. Diabetic: No results for input(s): "HGBA1C" in the last 72 hours.  Recent Labs  Lab 11/09/21 1125  GLUCAP 104*   Cardiac Enzymes: Recent Labs  Lab 11/10/21 0336  CKTOTAL 93   No results for input(s): "PROBNP" in the last 8760 hours. Coagulation Profile: No results for input(s): "INR", "PROTIME" in the last 168 hours. Thyroid Function Tests: Recent Labs    11/12/21 1321  TSH 1.018   Lipid Profile: No results for input(s): "CHOL", "HDL", "LDLCALC", "TRIG", "CHOLHDL", "LDLDIRECT" in the last 72 hours. Anemia Panel: Recent Labs    11/11/21 0214  VITAMINB12 776  FOLATE 9.3  FERRITIN 72  TIBC 223*  IRON 45  RETICCTPCT 1.6   Urine analysis:    Component Value Date/Time   COLORURINE YELLOW 11/08/2021 1250   APPEARANCEUR HAZY (A) 11/08/2021 1250   LABSPEC 1.013 11/08/2021 1250    PHURINE 5.0 11/08/2021 1250   GLUCOSEU NEGATIVE 11/08/2021 1250   HGBUR SMALL (A) 11/08/2021 1250   BILIRUBINUR NEGATIVE 11/08/2021 1250   KETONESUR 5 (A) 11/08/2021 1250   PROTEINUR NEGATIVE 11/08/2021 1250   NITRITE NEGATIVE 11/08/2021 1250   LEUKOCYTESUR TRACE (A) 11/08/2021 1250   Sepsis Labs: Invalid input(s): "PROCALCITONIN", "LACTICIDVEN"  Microbiology: No results found for this or any previous visit (from the past 240 hour(s)).  Radiology Studies: No results found.    Aldena Worm T. [place]  If 7PM-7AM, please contact night-coverage [email or web address] 11/13/2021, 4:26 PM

## 2021-11-13 NOTE — H&P (View-Only) (Signed)
Daily Rounding Note  11/13/2021, 11:28 AM  LOS: 4 days   SUBJECTIVE:   Chief complaint: Nausea, vomiting.  No reports of hematemesis  Patient started vomiting when her diet was advanced from clear liquids so made NPO as of last night.  She was tolerating clears however.  This morning she has had some dry heaves but overall nausea is better.  Had a light brown stool, small quantity yesterday and today.  Slight right-sided upper abdominal pain, not severe.  OBJECTIVE:         Vital signs in last 24 hours:    Temp:  [97.4 F (36.3 C)-98.9 F (37.2 C)] 98.1 F (36.7 C) (06/12 0940) Pulse Rate:  [57-70] 60 (06/12 0940) Resp:  [16-18] 17 (06/12 0940) BP: (124-153)/(64-82) 146/82 (06/12 0940) SpO2:  [98 %-100 %] 100 % (06/12 0940) Last BM Date : 11/12/21 Filed Weights   11/09/21 1118  Weight: 61.8 kg   General: Pleasant, calm.  Expressive aphasia.  Tries hard to answer questions Heart: RRR. Chest: Diminished breath sounds but clear bilaterally. Abdomen: Soft.  Minimal right mid to upper quadrant discomfort without guarding or rebound.  Active bowel sounds.  No distention.  No organomegaly. Extremities: No CCE. Neuro/Psych: Expressive aphasia.  Follows commands.  Intake/Output from previous day: 06/11 0701 - 06/12 0700 In: 2147.9 [P.O.:120; I.V.:2027.9] Out: 0   Intake/Output this shift: Total I/O In: 557.2 [I.V.:557.2] Out: -   Lab Results: Recent Labs    11/11/21 0214 11/12/21 0450 11/13/21 0439  WBC 5.1 6.2 4.2  HGB 10.0* 10.5* 10.1*  HCT 30.2* 30.0* 29.9*  PLT 341 370 376   BMET Recent Labs    11/11/21 0214 11/12/21 0450 11/13/21 0439  NA 140 139 142  K 4.0 3.1* 4.4  CL 114* 115* 119*  CO2 21* 18* 19*  GLUCOSE 81 92 76  BUN 18 10 7*  CREATININE 1.50* 1.30* 1.36*  CALCIUM 8.5* 8.2* 8.4*   LFT Recent Labs    11/11/21 0214 11/12/21 0450 11/13/21 0439  PROT  --  5.2*  --   ALBUMIN 2.6*  2.7* 2.5*  AST  --  16  --   ALT  --  13  --   ALKPHOS  --  117  --   BILITOT  --  0.5  --    PT/INR No results for input(s): "LABPROT", "INR" in the last 72 hours. Hepatitis Panel No results for input(s): "HEPBSAG", "HCVAB", "HEPAIGM", "HEPBIGM" in the last 72 hours.  Studies/Results: No results found.  Scheduled Meds:  amitriptyline  25 mg Oral QHS   aspirin EC  81 mg Oral Daily   atorvastatin  80 mg Oral Daily   levETIRAcetam  1,500 mg Oral BID   metoCLOPramide (REGLAN) injection  5 mg Intravenous TID AC & HS   pantoprazole (PROTONIX) IV  40 mg Intravenous Q12H   sertraline  50 mg Oral Daily   sodium bicarbonate  650 mg Oral TID   sodium chloride flush  3 mL Intravenous Q12H   sucralfate  1 g Oral BID   Continuous Infusions:  0.9 % NaCl with KCl 20 mEq / L 100 mL/hr at 11/13/21 1100   heparin 1,300 Units/hr (11/13/21 1100)   PRN Meds:.acetaminophen **OR** acetaminophen, morphine injection, ondansetron **OR** ondansetron (ZOFRAN) IV, polyethylene glycol, senna-docusate   ASSESMENT:   Intractable nausea, vomiting.  Previous esophagitis, erosive gastritis.  No acid suppressing medications PTA.  07/2019 EGD with grade D esophagitis,  gastric erosions.  PPI therapy initiated along with Carafate this admission.  Rule out ongoing esophagitis, gastritis, esophageal dysmotility.    [place] anemia.  Hgb baseline around 10.5.  14.9 at admission in setting of dehydration, diminished p.o. intake.  Dropped to 11.7 within 2 days and now 10.1 today Hospital day 5.  No PRBC to date.    Chronic Eliquis.  History CVA and stenting.  Expressive aphasia.  Last dose was 6/11, on hold.  Heparin drip in place.  Screening colonoscopy 05/2016, Dr. Shana Chute showing sigmoid diverticulosis, internal hemorrhoids.  Repeat recommended 05/2026   PLAN   EGD tomorrow at 7:30 AM.  Stop heparin at 3 AM.  Wrote patient for clears and n.p.o. after midnight.  Continue PPI, Carafate.    Azucena Freed  11/13/2021,  11:28 AM Phone [phone]     [facility] Attending   I have taken an interval history, reviewed the chart and examined the patient. I agree with the Advanced Practitioner's note, impression and recommendations.  Majority the medical decision-making in the formulation of the assessment and plan were performed by me.  EGD tomorrow. Reviewed w/ patient and brother.  Gatha Mayer, MD, [facility] See Shea Evans on call - gastroenterology for best contact person 11/13/2021 1:38 PM

## 2021-11-13 NOTE — Progress Notes (Signed)
PIV consult: Current medications are compatible, running. Discussed with Margreta Journey, RN: she will enter new IV Team consult if incompatible medications are ordered.

## 2021-11-13 NOTE — Anesthesia Preprocedure Evaluation (Addendum)
Anesthesia Evaluation  Patient identified by MRN, date of birth, ID band Patient awake    Reviewed: Allergy & Precautions, NPO status , Patient's Chart, lab work & pertinent test results  History of Anesthesia Complications Negative for: history of anesthetic complications  Airway Mallampati: II  TM Distance: >3 FB Neck ROM: Full    Dental  (+) Dental Advisory Given   Pulmonary asthma ,    Pulmonary exam normal        Cardiovascular hypertension (not taking meds currently), Normal cardiovascular exam   '22 TTE - EF 55 to 60%. Moderate asymmetric left ventricular hypertrophy of the basal-septal segment. Grade I diastolic dysfunction (impaired relaxation). Mildly elevated pulmonary artery systolic pressure. Trivial MR. Mild-mod TR.     Neuro/Psych  Headaches, Seizures -, Well Controlled,  PSYCHIATRIC DISORDERS Depression CVA, Residual Symptoms    GI/Hepatic Neg liver ROS, PUD, GERD  ,  Endo/Other  diabetes  Renal/GU CRFRenal disease     Musculoskeletal negative musculoskeletal ROS (+)   Abdominal   Peds  Hematology  (+) Blood dyscrasia, anemia ,  On eliquis    Anesthesia Other Findings   Reproductive/Obstetrics                           Anesthesia Physical Anesthesia Plan  ASA: 3  Anesthesia Plan: MAC   Post-op Pain Management: Minimal or no pain anticipated   Induction:   PONV Risk Score and Plan: 2 and Propofol infusion and Treatment may vary due to age or medical condition  Airway Management Planned: Nasal Cannula and Natural Airway  Additional Equipment: None  Intra-op Plan:   Post-operative Plan:   Informed Consent: I have reviewed the patients History and Physical, chart, labs and discussed the procedure including the risks, benefits and alternatives for the proposed anesthesia with the patient or authorized representative who has indicated his/her understanding and  acceptance.       Plan Discussed with: CRNA and Anesthesiologist  Anesthesia Plan Comments:        Anesthesia Quick Evaluation

## 2021-11-13 NOTE — TOC Progression Note (Signed)
Transition of Care [facility]) - Progression Note    Patient Details  Name: Courtney Burke MRN: [id number] Date of Birth: 10/09/1955  Transition of Care [facility]) CM/SW Contact  Tom-Johnson, Renea Ee, RN Phone Number: 11/13/2021, 4:11 PM  Clinical Narrative:     Home Health PT/OT recommended. Patient was active with [facility] and requests using their disciplines again. CM contacted Amy and acceptance voiced. Info on AVS. CM will continue to follow with needs  Expected Discharge Plan: [place] Barriers to Discharge: Continued Medical Work up  Expected Discharge Plan and Services Expected Discharge Plan: [place]   Discharge Planning Services: CM Consult   Living arrangements for the past 2 months: Single Family Home                 DME Arranged: N/A DME Agency: NA       HH Arranged: PT, OT HH Agency: [place] Date HH Agency Contacted: 11/13/21 Time HH Agency Contacted: 104 Representative spoke with at [place]: Amy   Social Determinants of Health ([place]) Interventions    Readmission Risk Interventions     No data to display

## 2021-11-13 NOTE — Progress Notes (Signed)
Occupational Therapy Treatment Patient Details Name: Courtney Burke MRN: [id number] DOB: 1956/02/18 Today's Date: 11/13/2021   History of present illness 66 y/o female presented to ED on 11/08/21 for persistent nausea and vomiting x 1 week. Admitted for AKI on CKD. PMH: L MCA stroke s/p stenting, seizures on keppra, expressive aphasia, HTN, gastric sleeve surgery, CKD3B,T2DM   OT comments  Patient received in bed and asking to use bathroom. Patient able to get to EOB without assistance and ambulated to bathroom with IV pole. Patient was min assist for toilet transfer due to safety and was able to manage hygiene. Patient stood at sink for hand hygiene with min guard. Patient was able to change socks and donn pants with min assist when standing. Patient is making good progress and will continue to be followed by acute OT.    Recommendations for follow up therapy are one component of a multi-disciplinary discharge planning process, led by the attending physician.  Recommendations may be updated based on patient status, additional functional criteria and insurance authorization.    Follow Up Recommendations  Home health OT    Assistance Recommended at Discharge Intermittent Supervision/Assistance  Patient can return home with the following  A little help with walking and/or transfers;A little help with bathing/dressing/bathroom;Assistance with cooking/housework;Direct supervision/assist for medications management;Direct supervision/assist for financial management;Assist for transportation;Help with stairs or ramp for entrance   Equipment Recommendations  None recommended by OT    Recommendations for Other Services      Precautions / Restrictions Precautions Precautions: Fall Restrictions Weight Bearing Restrictions: No       Mobility Bed Mobility Overal bed mobility: Modified Independent             General bed mobility comments: able to get to EOB without assistance     Transfers Overall transfer level: Needs assistance Equipment used: None Transfers: Sit to/from Stand Sit to Stand: Min guard           General transfer comment: min assist to transfer to toilet in bathroom     Balance Overall balance assessment: Needs assistance Sitting-balance support: No upper extremity supported, Feet supported Sitting balance-Leahy Scale: Good     Standing balance support: Bilateral upper extremity supported, Reliant on assistive device for balance Standing balance-Leahy Scale: Fair Standing balance comment: reliant on UE support                           ADL either performed or assessed with clinical judgement   ADL Overall ADL's : Needs assistance/impaired     Grooming: Wash/dry hands;Wash/dry face;Min guard;Standing Grooming Details (indicate cue type and reason): at sink             Lower Body Dressing: Minimal assistance;Sitting/lateral leans;Sit to/from stand Lower Body Dressing Details (indicate cue type and reason): donn pants and changed socks Toilet Transfer: Minimal assistance;Ambulation Toilet Transfer Details (indicate cue type and reason): min assist for balance with verbal cues for safety           General ADL Comments: used IV pole for mobility    Extremity/Trunk Assessment              Vision       Perception     Praxis      Cognition Arousal/Alertness: Awake/alert Behavior During Therapy: WFL for tasks assessed/performed Overall Cognitive Status: History of cognitive impairments - at baseline  General Comments: Expressive aphasia, patient spoke of deficits and states communcation has her biggest issue        Exercises      Shoulder Instructions       General Comments Pt seemed to want to communicate    Pertinent Vitals/ Pain       Pain Assessment Pain Assessment: Faces Faces Pain Scale: Hurts a little bit Pain Location: Abdominal  discomfort Pain Descriptors / Indicators: Discomfort Pain Intervention(s): Monitored during session  Home Living                                          Prior Functioning/Environment              Frequency  Min 2X/week        Progress Toward Goals  OT Goals(current goals can now be found in the care plan section)  Progress towards OT goals: Progressing toward goals  Acute Rehab OT Goals Patient Stated Goal: increase communication OT Goal Formulation: With patient Time For Goal Achievement: 11/24/21 Potential to Achieve Goals: Good ADL Goals Pt Will Perform Grooming: with modified independence;standing Pt Will Perform Lower Body Bathing: with modified independence;sitting/lateral leans;sit to/from stand Pt Will Perform Lower Body Dressing: with modified independence;sitting/lateral leans;sit to/from stand Pt Will Transfer to Toilet: with modified independence;ambulating Pt Will Perform Toileting - Clothing Manipulation and hygiene: with modified independence;sitting/lateral leans;sit to/from stand  Plan Discharge plan remains appropriate    Co-evaluation                 AM-PAC OT "6 Clicks" Daily Activity     Outcome Measure   Help from another person eating meals?: A Little Help from another person taking care of personal grooming?: A Little Help from another person toileting, which includes using toliet, bedpan, or urinal?: A Little Help from another person bathing (including washing, rinsing, drying)?: A Little Help from another person to put on and taking off regular upper body clothing?: A Little Help from another person to put on and taking off regular lower body clothing?: A Little 6 Click Score: 18    End of Session Equipment Utilized During Treatment: Gait belt  OT Visit Diagnosis: Unsteadiness on feet (R26.81);Other abnormalities of gait and mobility (R26.89);Muscle weakness (generalized) (M62.81)   Activity Tolerance Patient  tolerated treatment well   Patient Left in chair;with call bell/phone within reach;with chair alarm set   Nurse Communication Mobility status        Time: 4098-1191 OT Time Calculation (min): 28 min  Charges: OT General Charges $OT Visit: 1 Visit OT Treatments $Self Care/Home Management : 23-37 mins  Lodema Hong, [place]  Pager [phone] Office [place] 11/13/2021, 3:17 PM

## 2021-11-13 NOTE — Progress Notes (Signed)
ANTICOAGULATION CONSULT NOTE - Follow Up Consult  Pharmacy Consult for heparin Indication: atrial fibrillation  Labs: Recent Labs    11/11/21 0214 11/12/21 0450 11/12/21 2007 11/13/21 0439  HGB 10.0* 10.5*  --  10.1*  HCT 30.2* 30.0*  --  29.9*  PLT 341 370  --  376  APTT  --   --  46* 31  HEPARINUNFRC  --   --   --  0.67  CREATININE 1.50* 1.30*  --  1.36*    Assessment: 66yo female subtherapeutic on heparin with lower PTT despite rate increase; no infusion issues or signs of bleeding per RN.  Goal of Therapy:  aPTT 66-102 seconds   Plan:  Will increase heparin infusion by 4 units/kg/hr to 1300 units/hr and check PTT in 8 hours.    Wynona Neat, PharmD, BCPS  11/13/2021,6:34 AM

## 2021-11-13 NOTE — Progress Notes (Signed)
Physical Therapy Treatment Patient Details Name: Courtney Burke MRN: [id number] DOB: 05/06/1956 Today's Date: 11/13/2021   History of Present Illness 66 y/o female presented to ED on 11/08/21 for persistent nausea and vomiting x 1 week. Admitted for AKI on CKD. PMH: L MCA stroke s/p stenting, seizures on keppra, expressive aphasia, HTN, gastric sleeve surgery, CKD3B,T2DM    PT Comments    Continuing work on functional mobility and activity tolerance;  Noting very nice progress towards goals; walked the hallway with handheld assist, pushing IV pole, and occasionally using hallway rail; still some gait assymetry, with decr time in R stance compared to L, but smoothing out compared to last PT session;   Pt seemed to want to communicate her frustration with her medical care leading up to this hospitalization; [name] onthe phone with pt and this PT; they are considering changing PCP.   Recommendations for follow up therapy are one component of a multi-disciplinary discharge planning process, led by the attending physician.  Recommendations may be updated based on patient status, additional functional criteria and insurance authorization.  Follow Up Recommendations  Home health PT     Assistance Recommended at Discharge Frequent or constant Supervision/Assistance  Patient can return home with the following A little help with walking and/or transfers;A little help with bathing/dressing/bathroom;Assistance with cooking/housework;Direct supervision/assist for financial management;Direct supervision/assist for medications management;Assist for transportation;Help with stairs or ramp for entrance   Equipment Recommendations  None recommended by PT    Recommendations for Other Services       Precautions / Restrictions Precautions Precautions: Fall Restrictions Weight Bearing Restrictions: No     Mobility  Bed Mobility                    Transfers Overall transfer  level: Needs assistance Equipment used: None Transfers: Sit to/from Stand Sit to Stand: Min guard (without physical assist)           General transfer comment: Used armrests to push up; wide stance at initial stand, but overall steady    Ambulation/Gait Ambulation/Gait assistance: Min assist, Min guard Gait Distance (Feet): 160 Feet Assistive device: IV Pole, 1 person hand held assist (and occasionally using hallway rail) Gait Pattern/deviations: Step-through pattern Gait velocity: decreased     General Gait Details: Much improved gait pattern with no overt losses of balance; still with decr stance time R, but did not note any leaving R foot behind   Stairs             Wheelchair Mobility    Modified Rankin (Stroke Patients Only)       Balance     Sitting balance-Leahy Scale: Good       Standing balance-Leahy Scale: Fair                              Cognition Arousal/Alertness: Awake/alert Behavior During Therapy: WFL for tasks assessed/performed Overall Cognitive Status: History of cognitive impairments - at baseline                                 General Comments: Expressive aphasia; pt called her daughter Courtney Burke for FaceTime during session to help her communicate to this PT some of the problems with N/V that lead to this admission        Exercises      General Comments General comments (skin integrity,  edema, etc.): Pt seemed to want to communicate her frustration with her medical care leading up to this hospitalization; [name] onthe phone with pt and this PT; they are considering changing PCP      Pertinent Vitals/Pain Pain Assessment Pain Assessment: Faces Faces Pain Scale: Hurts a little bit Pain Location: Abdominal discomfort Pain Descriptors / Indicators: Discomfort Pain Intervention(s): Monitored during session    Home Living                          Prior Function            PT Goals  (current goals can now be found in the care plan section) Acute Rehab PT Goals Patient Stated Goal: Difficulty stating; wants to be understood PT Goal Formulation: With patient Time For Goal Achievement: 11/24/21 Potential to Achieve Goals: Good Progress towards PT goals: Progressing toward goals    Frequency    Min 3X/week      PT Plan Current plan remains appropriate (Can consider Outpt therapies - PT, OT, ST to challenge pt more; but getting to [facility] appointments may be a hardship for pt)    Co-evaluation              AM-PAC PT "6 Clicks" Mobility   Outcome Measure  Help needed turning from your back to your side while in a flat bed without using bedrails?: None Help needed moving from lying on your back to sitting on the side of a flat bed without using bedrails?: None Help needed moving to and from a bed to a chair (including a wheelchair)?: None Help needed standing up from a chair using your arms (e.g., wheelchair or bedside chair)?: A Little Help needed to walk in hospital room?: A Little Help needed climbing 3-5 steps with a railing? : A Little 6 Click Score: 21    End of Session Equipment Utilized During Treatment: Gait belt Activity Tolerance: Patient tolerated treatment well Patient left: in chair;with call bell/phone within reach;with chair alarm set Nurse Communication: Mobility status PT Visit Diagnosis: Unsteadiness on feet (R26.81);Muscle weakness (generalized) (M62.81);Other abnormalities of gait and mobility (R26.89)     Time: 4481-8563 PT Time Calculation (min) (ACUTE ONLY): 29 min  Charges:  $Gait Training: 23-37 mins                     Roney Marion, [place] Office [place] 11/13/2021, 5:17 PM

## 2021-11-13 NOTE — Progress Notes (Addendum)
Daily Rounding Note  11/13/2021, 11:28 AM  LOS: 4 days   SUBJECTIVE:   Chief complaint: Nausea, vomiting.  No reports of hematemesis  Patient started vomiting when her diet was advanced from clear liquids so made NPO as of last night.  She was tolerating clears however.  This morning she has had some dry heaves but overall nausea is better.  Had a light brown stool, small quantity yesterday and today.  Slight right-sided upper abdominal pain, not severe.  OBJECTIVE:         Vital signs in last 24 hours:    Temp:  [97.4 F (36.3 C)-98.9 F (37.2 C)] 98.1 F (36.7 C) (06/12 0940) Pulse Rate:  [57-70] 60 (06/12 0940) Resp:  [16-18] 17 (06/12 0940) BP: (124-153)/(64-82) 146/82 (06/12 0940) SpO2:  [98 %-100 %] 100 % (06/12 0940) Last BM Date : 11/12/21 Filed Weights   11/09/21 1118  Weight: 61.8 kg   General: Pleasant, calm.  Expressive aphasia.  Tries hard to answer questions Heart: RRR. Chest: Diminished breath sounds but clear bilaterally. Abdomen: Soft.  Minimal right mid to upper quadrant discomfort without guarding or rebound.  Active bowel sounds.  No distention.  No organomegaly. Extremities: No CCE. Neuro/Psych: Expressive aphasia.  Follows commands.  Intake/Output from previous day: 06/11 0701 - 06/12 0700 In: 2147.9 [P.O.:120; I.V.:2027.9] Out: 0   Intake/Output this shift: Total I/O In: 557.2 [I.V.:557.2] Out: -   Lab Results: Recent Labs    11/11/21 0214 11/12/21 0450 11/13/21 0439  WBC 5.1 6.2 4.2  HGB 10.0* 10.5* 10.1*  HCT 30.2* 30.0* 29.9*  PLT 341 370 376   BMET Recent Labs    11/11/21 0214 11/12/21 0450 11/13/21 0439  NA 140 139 142  K 4.0 3.1* 4.4  CL 114* 115* 119*  CO2 21* 18* 19*  GLUCOSE 81 92 76  BUN 18 10 7*  CREATININE 1.50* 1.30* 1.36*  CALCIUM 8.5* 8.2* 8.4*   LFT Recent Labs    11/11/21 0214 11/12/21 0450 11/13/21 0439  PROT  --  5.2*  --   ALBUMIN 2.6*  2.7* 2.5*  AST  --  16  --   ALT  --  13  --   ALKPHOS  --  117  --   BILITOT  --  0.5  --    PT/INR No results for input(s): "LABPROT", "INR" in the last 72 hours. Hepatitis Panel No results for input(s): "HEPBSAG", "HCVAB", "HEPAIGM", "HEPBIGM" in the last 72 hours.  Studies/Results: No results found.  Scheduled Meds:  amitriptyline  25 mg Oral QHS   aspirin EC  81 mg Oral Daily   atorvastatin  80 mg Oral Daily   levETIRAcetam  1,500 mg Oral BID   metoCLOPramide (REGLAN) injection  5 mg Intravenous TID AC & HS   pantoprazole (PROTONIX) IV  40 mg Intravenous Q12H   sertraline  50 mg Oral Daily   sodium bicarbonate  650 mg Oral TID   sodium chloride flush  3 mL Intravenous Q12H   sucralfate  1 g Oral BID   Continuous Infusions:  0.9 % NaCl with KCl 20 mEq / L 100 mL/hr at 11/13/21 1100   heparin 1,300 Units/hr (11/13/21 1100)   PRN Meds:.acetaminophen **OR** acetaminophen, morphine injection, ondansetron **OR** ondansetron (ZOFRAN) IV, polyethylene glycol, senna-docusate   ASSESMENT:   Intractable nausea, vomiting.  Previous esophagitis, erosive gastritis.  No acid suppressing medications PTA.  07/2019 EGD with grade D esophagitis,  gastric erosions.  PPI therapy initiated along with Carafate this admission.  Rule out ongoing esophagitis, gastritis, esophageal dysmotility.    Waller anemia.  Hgb baseline around 10.5.  14.9 at admission in setting of dehydration, diminished p.o. intake.  Dropped to 11.7 within 2 days and now 10.1 today Hospital day 5.  No PRBC to date.    Chronic Eliquis.  History CVA and stenting.  Expressive aphasia.  Last dose was 6/11, on hold.  Heparin drip in place.  Screening colonoscopy 05/2016, Dr. Shana Chute showing sigmoid diverticulosis, internal hemorrhoids.  Repeat recommended 05/2026   PLAN   EGD tomorrow at 7:30 AM.  Stop heparin at 3 AM.  Wrote patient for clears and n.p.o. after midnight.  Continue PPI, Carafate.    Courtney Burke  11/13/2021,  11:28 AM Phone [phone]     [facility] Attending   I have taken an interval history, reviewed the chart and examined the patient. I agree with the Advanced Practitioner's note, impression and recommendations.  Majority the medical decision-making in the formulation of the assessment and plan were performed by me.  EGD tomorrow. Reviewed w/ patient and brother.  Gatha Mayer, MD, [facility] See Shea Evans on call - gastroenterology for best contact person 11/13/2021 1:38 PM

## 2021-11-14 ENCOUNTER — Inpatient Hospital Stay (HOSPITAL_COMMUNITY): Payer: Medicare Other | Admitting: Anesthesiology

## 2021-11-14 ENCOUNTER — Encounter (HOSPITAL_COMMUNITY): Payer: Self-pay | Admitting: Student

## 2021-11-14 ENCOUNTER — Encounter (HOSPITAL_COMMUNITY)
Admission: EM | Disposition: A | Discharge status: Home Health | Payer: Self-pay | Source: Home / Self Care | Attending: Student

## 2021-11-14 DIAGNOSIS — N179 Acute kidney failure, unspecified: Secondary | ICD-10-CM | POA: Diagnosis not present

## 2021-11-14 DIAGNOSIS — R1115 Cyclical vomiting syndrome unrelated to migraine: Secondary | ICD-10-CM

## 2021-11-14 DIAGNOSIS — R112 Nausea with vomiting, unspecified: Secondary | ICD-10-CM | POA: Diagnosis not present

## 2021-11-14 DIAGNOSIS — E1122 Type 2 diabetes mellitus with diabetic chronic kidney disease: Secondary | ICD-10-CM

## 2021-11-14 DIAGNOSIS — N1831 Chronic kidney disease, stage 3a: Secondary | ICD-10-CM

## 2021-11-14 DIAGNOSIS — I480 Paroxysmal atrial fibrillation: Secondary | ICD-10-CM | POA: Diagnosis not present

## 2021-11-14 DIAGNOSIS — I129 Hypertensive chronic kidney disease with stage 1 through stage 4 chronic kidney disease, or unspecified chronic kidney disease: Secondary | ICD-10-CM

## 2021-11-14 DIAGNOSIS — Z8673 Personal history of transient ischemic attack (TIA), and cerebral infarction without residual deficits: Secondary | ICD-10-CM | POA: Diagnosis not present

## 2021-11-14 DIAGNOSIS — Z980 Intestinal bypass and anastomosis status: Secondary | ICD-10-CM

## 2021-11-14 HISTORY — PX: ESOPHAGOGASTRODUODENOSCOPY (EGD) WITH PROPOFOL: SHX5813

## 2021-11-14 LAB — APTT
aPTT: >200 seconds (ref 24–36)
aPTT: 76 seconds — ABNORMAL HIGH (ref 24–36)

## 2021-11-14 LAB — CBC
HCT: 27 % — ABNORMAL LOW (ref 36.0–46.0)
Hemoglobin: 9.5 g/dL — ABNORMAL LOW (ref 12.0–15.0)
MCH: 28.9 pg (ref 26.0–34.0)
MCHC: 35.2 g/dL (ref 30.0–36.0)
MCV: 82.1 fL (ref 80.0–100.0)
Platelets: 366 10*3/uL (ref 150–400)
RBC: 3.29 MIL/uL — ABNORMAL LOW (ref 3.87–5.11)
RDW: 13.1 % (ref 11.5–15.5)
WBC: 5.1 10*3/uL (ref 4.0–10.5)
nRBC: 0 % (ref 0.0–0.2)

## 2021-11-14 LAB — RENAL FUNCTION PANEL
Albumin: 2.5 g/dL — ABNORMAL LOW (ref 3.5–5.0)
Anion gap: 3 — ABNORMAL LOW (ref 5–15)
BUN: 6 mg/dL — ABNORMAL LOW (ref 8–23)
CO2: 19 mmol/L — ABNORMAL LOW (ref 22–32)
Calcium: 8.4 mg/dL — ABNORMAL LOW (ref 8.9–10.3)
Chloride: 117 mmol/L — ABNORMAL HIGH (ref 98–111)
Creatinine, Ser: 1.21 mg/dL — ABNORMAL HIGH (ref 0.44–1.00)
GFR, Estimated: 49 mL/min — ABNORMAL LOW (ref >60)
Glucose, Bld: 74 mg/dL (ref 70–99)
Phosphorus: 2.4 mg/dL — ABNORMAL LOW (ref 2.5–4.6)
Potassium: 4.2 mmol/L (ref 3.5–5.1)
Sodium: 139 mmol/L (ref 135–145)

## 2021-11-14 LAB — HEPARIN LEVEL (UNFRACTIONATED): Heparin Unfractionated: 1.08 IU/mL — ABNORMAL HIGH (ref 0.30–0.70)

## 2021-11-14 LAB — MAGNESIUM: Magnesium: 1.7 mg/dL (ref 1.7–2.4)

## 2021-11-14 SURGERY — ESOPHAGOGASTRODUODENOSCOPY (EGD) WITH PROPOFOL
Anesthesia: Monitor Anesthesia Care

## 2021-11-14 MED ORDER — HYDRALAZINE HCL 25 MG PO TABS: 25.0000 mg | ORAL_TABLET | Freq: Four times a day (QID) | ORAL | Status: DC | PRN

## 2021-11-14 MED ORDER — LIDOCAINE 2% (20 MG/ML) 5 ML SYRINGE
INTRAMUSCULAR | Status: DC | PRN
  Administered 2021-11-14: 30 mg via INTRAVENOUS

## 2021-11-14 MED ORDER — PROPOFOL 10 MG/ML IV BOLUS
INTRAVENOUS | Status: DC | PRN
  Administered 2021-11-14: 10 mg via INTRAVENOUS

## 2021-11-14 MED ORDER — PROPOFOL 500 MG/50ML IV EMUL
INTRAVENOUS | Status: DC | PRN
  Administered 2021-11-14: 100 ug/kg/min via INTRAVENOUS

## 2021-11-14 MED ORDER — LACTATED RINGERS IV SOLN
INTRAVENOUS | Status: AC | PRN
  Administered 2021-11-14: 20 mL/h via INTRAVENOUS

## 2021-11-14 MED ORDER — AMLODIPINE BESYLATE 10 MG PO TABS
10.0000 mg | ORAL_TABLET | Freq: Every day | ORAL | Status: DC
  Administered 2021-11-14 – 2021-11-15 (×2): 10 mg via ORAL
  Filled 2021-11-14 (×2): qty 1

## 2021-11-14 MED ORDER — APIXABAN 5 MG PO TABS
5.0000 mg | ORAL_TABLET | Freq: Two times a day (BID) | ORAL | Status: DC
  Administered 2021-11-14 – 2021-11-15 (×2): 5 mg via ORAL
  Filled 2021-11-14 (×3): qty 1

## 2021-11-14 MED ORDER — SODIUM CHLORIDE 0.9 % IV SOLN: INTRAVENOUS | Status: DC

## 2021-11-14 SURGICAL SUPPLY — 15 items

## 2021-11-14 NOTE — Progress Notes (Signed)
PROGRESS NOTE  Courtney Burke ALP:[id number] DOB: 1955-12-27   PCP: [name], PA-C  Patient is from: Home.  Lives with daughter.  Independently ambulates at baseline.  DOA: 11/08/2021 LOS: 5  Chief complaints Chief Complaint  Patient presents with   Abdominal Pain   Emesis     Brief Narrative / Interim history: 66 year old F with PMH of gastric bypass/sleeve gastrectomy, esophagitis, erosive gastritis, paroxysmal A-fib on Eliquis, left MCA CVA s/p left MCA stent in 06/2020, residual aphasia, CKD-3B, seizure disorder, anxiety, depression, HTN and HLD presenting with nausea and vomiting for about a week and intermittent abdominal pain for the same duration, and admitted for intractable nausea and vomiting and AKI.  Creatinine 2.2 (baseline 1.5).  CBC and UA without significant finding.  CT abdomen and pelvis without significant finding.  She was started on IV fluid and antiemetics and admitted.  Patient had episodes of emesis and abdominal pain after interval improvement.  GI consulted.  She underwent EGD on 6/13 which was basically normal.  GI recommended advancing diet as tolerated.  Subjective: Seen and examined earlier this afternoon after she returned from EGD.  No major events overnight of this morning.  No complaints.  She feels hungry.  She states they did not bring enough chicken broth.  She likes to have more.  I advance her to full liquid diet.  She is very happy.  She denies abdominal pain or nausea.  Objective: Vitals:   11/14/21 1100 11/14/21 1110 11/14/21 1120 11/14/21 1128  BP: (!) 162/86 (!) 154/96 (!) 189/72 (!) 183/98  Pulse:  (!) 58 (!) 58 (!) 59  Resp:  '17 13 18  '$ Temp:    97.8 F (36.6 C)  TempSrc:    Oral  SpO2:  100% 100% 100%  Weight:      Height:        Examination:  GENERAL: No apparent distress.  Nontoxic. HEENT: MMM.  Vision and hearing grossly intact.  NECK: Supple.  No apparent JVD.  RESP:  No IWOB.  Fair aeration  bilaterally. CVS:  RRR. Heart sounds normal.  ABD/GI/GU: BS+. Abd soft, NTND.  MSK/EXT:  Moves extremities. No apparent deformity. No edema.  SKIN: no apparent skin lesion or wound NEURO: Awake and alert. Oriented fairly.  Aphasia.  No apparent focal neuro deficit. PSYCH: Calm. Normal affect.   Procedures:  6/13-EGD basically normal.  Microbiology summarized: None  Assessment and Plan: Intractable nausea, vomiting and abdominal pain: unclear etiology of this.  She has history of gastric bypass, esophagitis, gastritis but her EGD is normal now.  She has no leukocytosis, fever or diarrhea.  CT A/P without acute finding other than previous partial gastrectomy.  -Appreciate help by GI-recommended advancing diet. -Continue Reglan to ACHS.  Continue IV Zofran as needed -Continue IV Protonix to 40 mg twice daily -Advance to full liquid diet. -Monitor electrolytes and replenish as appropriate  AKI on CKD-3B: Likely prerenal in the setting of GI loss.  AKI resolved. Recent Labs    05/18/21 0251 06/01/21 1225 08/28/21 1316 11/08/21 1214 11/09/21 0305 11/10/21 0336 11/11/21 0214 11/12/21 0450 11/13/21 0439 11/14/21 0425  BUN '17 11 20 '$ 37* 35* 26* 18 10 7* 6*  CREATININE 1.35* 1.44* 1.53* 2.22* 1.90* 1.75* 1.50* 1.30* 1.36* 1.21*  -Discontinue IV fluid.  History of CVA: has residual receptive and expressive aphasia.  S/p left MCA stent January 2022. -Continue Eliquis, aspirin, atorvastatin -PT/OT  Paroxysmal atrial fibrillation: in sinus rhythm.  Not on rate/rhythm control meds. -  Continue Eliquis  Normocytic anemia: Initial Hgb higher than baseline likely hemoconcentration in the setting of dehydration.   Recent Labs    05/16/21 0230 05/17/21 0429 05/18/21 0251 06/01/21 1225 11/08/21 1214 11/10/21 0336 11/11/21 0214 11/12/21 0450 11/13/21 0439 11/14/21 0425  HGB 10.1* 10.0* 10.3* 10.8* 14.9 11.7* 10.0* 10.5* 10.1* 9.5*  -Continue monitoring  Hypokalemia and  hypomagnesemia: Likely from GI loss. -Replenish and recheck  Metabolic acidosis: In the setting of nausea and vomiting and IV fluid..  Stable. -IV fluid discontinued. -Continue sodium bicarbonate  Hyponatremia: Resolved.  Seizure disorder: Stable. -Continue home Keppra 1500 mg BID.  Hyperlipidemia, unspecified -Continue atorvastatin.  Essential hypertension: normotensive off home antihypertensive meds for most part.  Reportedly not taking amlodipine and Diovan -Continue monitoring  DVT prophylaxis:  apixaban (ELIQUIS) tablet 5 mg   Code Status: Full code Family Communication: Updated patient's daughter over the phone Level of care: Med-Surg Status is: Inpatient  The patient will remain inpatient because: Intractable nausea and vomiting   Final disposition: Likely home in the next 24 to 48 hours if she tolerates soft diet. Consultants:  Gastroenterology  Sch Meds:  Scheduled Meds:  amitriptyline  25 mg Oral QHS   amLODipine  10 mg Oral Daily   apixaban  5 mg Oral BID   aspirin EC  81 mg Oral Daily   atorvastatin  80 mg Oral Daily   levETIRAcetam  1,500 mg Oral BID   metoCLOPramide (REGLAN) injection  5 mg Intravenous TID AC & HS   pantoprazole (PROTONIX) IV  40 mg Intravenous Q12H   sertraline  50 mg Oral Daily   sodium bicarbonate  650 mg Oral TID   sodium chloride flush  3 mL Intravenous Q12H   Continuous Infusions:  0.9 % NaCl with KCl 20 mEq / L 100 mL/hr at 11/14/21 1226   PRN Meds:.acetaminophen **OR** acetaminophen, hydrALAZINE, morphine injection, ondansetron **OR** ondansetron (ZOFRAN) IV, polyethylene glycol, senna-docusate  Antimicrobials: Anti-infectives (From admission, onward)    None        I have personally reviewed the following labs and images: CBC: Recent Labs  Lab 11/08/21 1214 11/10/21 0336 11/11/21 0214 11/12/21 0450 11/13/21 0439 11/14/21 0425  WBC 6.8 6.1 5.1 6.2 4.2 5.1  NEUTROABS 3.9  --   --   --   --   --   HGB 14.9  11.7* 10.0* 10.5* 10.1* 9.5*  HCT 44.7 33.5* 30.2* 30.0* 29.9* 27.0*  MCV 84.3 82.5 85.3 83.3 85.2 82.1  PLT 609* 376 341 370 376 366   BMP &GFR Recent Labs  Lab 11/10/21 0336 11/11/21 0214 11/12/21 0450 11/13/21 0439 11/14/21 0425  NA 135 140 139 142 139  K 3.6 4.0 3.1* 4.4 4.2  CL 112* 114* 115* 119* 117*  CO2 19* 21* 18* 19* 19*  GLUCOSE 93 81 92 76 74  BUN 26* 18 10 7* 6*  CREATININE 1.75* 1.50* 1.30* 1.36* 1.21*  CALCIUM 8.6* 8.5* 8.2* 8.4* 8.4*  MG 1.8 1.8 1.6* 1.9 1.7  PHOS 3.5 3.0 3.1 2.5 2.4*   Estimated Creatinine Clearance: 39 mL/min (A) (by C-G formula based on SCr of 1.21 mg/dL (H)). Liver & Pancreas: Recent Labs  Lab 11/08/21 1214 11/09/21 0305 11/10/21 0336 11/11/21 0214 11/12/21 0450 11/13/21 0439 11/14/21 0425  AST 26 44*  --   --  16  --   --   ALT 15 19  --   --  13  --   --   ALKPHOS 165* 138*  --   --  117  --   --   BILITOT 1.0 0.8  --   --  0.5  --   --   PROT 7.6 6.3*  --   --  5.2*  --   --   ALBUMIN 4.1 3.3* 2.9* 2.6* 2.7* 2.5* 2.5*   Recent Labs  Lab 11/08/21 1214 11/10/21 0336  LIPASE 31 31   No results for input(s): "AMMONIA" in the last 168 hours. Diabetic: No results for input(s): "HGBA1C" in the last 72 hours.  Recent Labs  Lab 11/09/21 1125  GLUCAP 104*   Cardiac Enzymes: Recent Labs  Lab 11/10/21 0336  CKTOTAL 93   No results for input(s): "PROBNP" in the last 8760 hours. Coagulation Profile: No results for input(s): "INR", "PROTIME" in the last 168 hours. Thyroid Function Tests: Recent Labs    11/12/21 1321  TSH 1.018   Lipid Profile: No results for input(s): "CHOL", "HDL", "LDLCALC", "TRIG", "CHOLHDL", "LDLDIRECT" in the last 72 hours. Anemia Panel: No results for input(s): "VITAMINB12", "FOLATE", "FERRITIN", "TIBC", "IRON", "RETICCTPCT" in the last 72 hours.  Urine analysis:    Component Value Date/Time   COLORURINE YELLOW 11/08/2021 1250   APPEARANCEUR HAZY (A) 11/08/2021 1250   LABSPEC 1.013  11/08/2021 1250   PHURINE 5.0 11/08/2021 1250   GLUCOSEU NEGATIVE 11/08/2021 1250   HGBUR SMALL (A) 11/08/2021 1250   BILIRUBINUR NEGATIVE 11/08/2021 1250   KETONESUR 5 (A) 11/08/2021 1250   PROTEINUR NEGATIVE 11/08/2021 1250   NITRITE NEGATIVE 11/08/2021 1250   LEUKOCYTESUR TRACE (A) 11/08/2021 1250   Sepsis Labs: Invalid input(s): "PROCALCITONIN", "LACTICIDVEN"  Microbiology: No results found for this or any previous visit (from the past 240 hour(s)).  Radiology Studies: No results found.    Benito Lemmerman T. [place]  If 7PM-7AM, please contact night-coverage [email or web address] 11/14/2021, 3:26 PM

## 2021-11-14 NOTE — Op Note (Signed)
[facility] Patient Name: Courtney Burke Procedure Date : 11/14/2021 MRN: [id number] Attending MD: Gatha Mayer , MD Date of Birth: [birth year] CSN: [id number] Age: 66 Admit Type: Inpatient Procedure:                Upper GI endoscopy Indications:              Persistent vomiting of unknown cause Providers:                Gatha Mayer, MD, Ervin Knack, RN, Cherylynn Ridges, Technician, Virgilio Belling. Beckner, CRNA Referring MD:              Medicines:                Monitored Anesthesia Care Complications:            No immediate complications. Estimated Blood Loss:     Estimated blood loss: none. Procedure:                Pre-Anesthesia Assessment:                           - Prior to the procedure, a History and Physical                            was performed, and patient medications and                            allergies were reviewed. The patient's tolerance of                            previous anesthesia was also reviewed. The risks                            and benefits of the procedure and the sedation                            options and risks were discussed with the patient.                            All questions were answered, and informed consent                            was obtained. Prior Anticoagulants: The patient                            last took Eliquis (apixaban) 2 days prior to the                            procedure and last took heparin on the day of the                            procedure. ASA Grade Assessment: III - A patient  with severe systemic disease. After reviewing the                            risks and benefits, the patient was deemed in                            satisfactory condition to undergo the procedure.                           After obtaining informed consent, the endoscope was                            passed under direct vision. Throughout the                             procedure, the patient's blood pressure, pulse, and                            oxygen saturations were monitored continuously. The                            [id number]-H190 ([id number] Olympus endoscope was introduced                            through the mouth, and advanced to the jejunum. The                            upper GI endoscopy was accomplished without                            difficulty. The patient tolerated the procedure                            well. Scope In: Scope Out: Findings:      The examined esophagus was normal.      Evidence of a gastrojejunostomy was found in the stomach. This was       characterized by healthy appearing mucosa.      The examined jejunum was normal. Impression:               - Normal esophagus.                           - A gastrojejunostomy was found, characterized by                            healthy appearing mucosa. There was an afferent and                            effernt limb. Both NL                           - Normal examined jejunum.                           - No specimens  collected. Recommendation:           - Return patient to hospital ward for ongoing care.                           - No cause of nausea and vomiting seen - this is a                            normal post-op exam                           Reattempt diet with clears and advance as tolerated                            (ordered)                           If vomits more look for non-GI causes                           OK to restart Eliquis                           I stopped sucralfate - no indication Procedure Code(s):        --- Professional ---                           [phone], Esophagogastroduodenoscopy, flexible,                            transoral; diagnostic, including collection of                            specimen(s) by brushing or washing, when performed                            (separate procedure) Diagnosis Code(s):        ---  Professional ---                           Z98.0, Intestinal bypass and anastomosis status                           U04.54, Cyclical vomiting syndrome unrelated to                            migraine CPT copyright 2019 [employer]. All rights reserved. The codes documented in this report are preliminary and upon coder review may  be revised to meet current compliance requirements. Gatha Mayer, MD 11/14/2021 11:00:53 AM This report has been signed electronically. Number of Addenda: 0

## 2021-11-14 NOTE — Progress Notes (Signed)
CRITICAL VALUE ALERT  Critical Value:  PTT >200s  Date & Time Notied:  11/14/21 '@7'$ :35am  Provider Notified: MD Cyndia Skeeters Also notify RN in [place]. Patient is in ENDO.   Orders Received/Actions taken: awaiting

## 2021-11-14 NOTE — Transfer of Care (Signed)
Immediate Anesthesia Transfer of Care Note  Patient: Courtney Burke  Procedure(s) Performed: ESOPHAGOGASTRODUODENOSCOPY (EGD) WITH PROPOFOL  Patient Location: Endoscopy Unit  Anesthesia Type:MAC  Level of Consciousness: awake, alert  and oriented  Airway & Oxygen Therapy: Patient Spontanous Breathing and Patient connected to nasal cannula oxygen  Post-op Assessment: Report given to RN and Post -op Vital signs reviewed and stable  Post vital signs: Reviewed and stable  Last Vitals:  Vitals Value Taken Time  BP    Temp    Pulse 62 11/14/21 1057  Resp 14 11/14/21 1057  SpO2 100 % 11/14/21 1057  Vitals shown include unvalidated device data.  Last Pain:  Vitals:   11/14/21 0706  TempSrc: Temporal  PainSc: 0-No pain         Complications: No notable events documented.

## 2021-11-14 NOTE — Interval H&P Note (Signed)
History and Physical Interval Note:  11/14/2021 10:10 AM  Courtney Burke  has presented today for surgery, with the diagnosis of Nausea, vomiting, and abdominal pain.  The various methods of treatment have been discussed with the patient and family. After consideration of risks, benefits and other options for treatment, the patient has consented to  Procedure(s): ESOPHAGOGASTRODUODENOSCOPY (EGD) WITH PROPOFOL (N/A) as a surgical intervention.  The patient's history has been reviewed, patient examined, no change in status, stable for surgery.  I have reviewed the patient's chart and labs.  Questions were answered to the patient's satisfaction.   We rechecked PTT due to > 200, returned at a therapeutic level and delayed case longer for further washout of heparin   Silvano Rusk

## 2021-11-14 NOTE — Anesthesia Postprocedure Evaluation (Signed)
Anesthesia Post Note  Patient: Courtney Burke  Procedure(s) Performed: ESOPHAGOGASTRODUODENOSCOPY (EGD) WITH PROPOFOL     Patient location during evaluation: PACU Anesthesia Type: MAC Level of consciousness: awake and alert Pain management: pain level controlled Vital Signs Assessment: post-procedure vital signs reviewed and stable Respiratory status: spontaneous breathing, nonlabored ventilation and respiratory function stable Cardiovascular status: stable and blood pressure returned to baseline Anesthetic complications: no   No notable events documented.  Last Vitals:  Vitals:   11/14/21 1120 11/14/21 1128  BP: (!) 189/72 (!) 183/98  Pulse: (!) 58 (!) 59  Resp: 13 18  Temp:  36.6 C  SpO2: 100% 100%    Last Pain:  Vitals:   11/14/21 1128  TempSrc: Oral  PainSc:                  Audry Pili

## 2021-11-14 NOTE — Anesthesia Procedure Notes (Signed)
Procedure Name: MAC Date/Time: 11/14/2021 10:35 AM  Performed by: Mariea Clonts, CRNAPre-anesthesia Checklist: Patient identified, Emergency Drugs available, Suction available, Patient being monitored and Timeout performed Patient Re-evaluated:Patient Re-evaluated prior to induction Oxygen Delivery Method: Simple face mask and Nasal cannula

## 2021-11-15 LAB — RENAL FUNCTION PANEL
Albumin: 2.7 g/dL — ABNORMAL LOW (ref 3.5–5.0)
Anion gap: 4 — ABNORMAL LOW (ref 5–15)
BUN: <5 mg/dL — ABNORMAL LOW (ref 8–23)
CO2: 22 mmol/L (ref 22–32)
Calcium: 8.5 mg/dL — ABNORMAL LOW (ref 8.9–10.3)
Chloride: 113 mmol/L — ABNORMAL HIGH (ref 98–111)
Creatinine, Ser: 1.26 mg/dL — ABNORMAL HIGH (ref 0.44–1.00)
GFR, Estimated: 47 mL/min — ABNORMAL LOW (ref >60)
Glucose, Bld: 74 mg/dL (ref 70–99)
Phosphorus: 2.3 mg/dL — ABNORMAL LOW (ref 2.5–4.6)
Potassium: 3.6 mmol/L (ref 3.5–5.1)
Sodium: 139 mmol/L (ref 135–145)

## 2021-11-15 LAB — CBC
HCT: 28.6 % — ABNORMAL LOW (ref 36.0–46.0)
Hemoglobin: 9.9 g/dL — ABNORMAL LOW (ref 12.0–15.0)
MCH: 28.5 pg (ref 26.0–34.0)
MCHC: 34.6 g/dL (ref 30.0–36.0)
MCV: 82.4 fL (ref 80.0–100.0)
Platelets: 374 10*3/uL (ref 150–400)
RBC: 3.47 MIL/uL — ABNORMAL LOW (ref 3.87–5.11)
RDW: 13.1 % (ref 11.5–15.5)
WBC: 3.9 10*3/uL — ABNORMAL LOW (ref 4.0–10.5)
nRBC: 0 % (ref 0.0–0.2)

## 2021-11-15 LAB — APTT: aPTT: 33 seconds (ref 24–36)

## 2021-11-15 LAB — MAGNESIUM: Magnesium: 1.5 mg/dL — ABNORMAL LOW (ref 1.7–2.4)

## 2021-11-15 LAB — HEPARIN LEVEL (UNFRACTIONATED): Heparin Unfractionated: 0.82 IU/mL — ABNORMAL HIGH (ref 0.30–0.70)

## 2021-11-15 MED ORDER — SENNOSIDES-DOCUSATE SODIUM 8.6-50 MG PO TABS: 1.0000 | ORAL_TABLET | Freq: Two times a day (BID) | ORAL | 0 refills | Status: DC | PRN

## 2021-11-15 MED ORDER — METOCLOPRAMIDE HCL 5 MG PO TABS: 5.0000 mg | ORAL_TABLET | Freq: Three times a day (TID) | ORAL | 0 refills | Status: DC | PRN

## 2021-11-15 MED ORDER — METOCLOPRAMIDE HCL 5 MG/ML IJ SOLN
5.0000 mg | Freq: Three times a day (TID) | INTRAMUSCULAR | Status: DC | PRN
  Administered 2021-11-15: 5 mg via INTRAVENOUS
  Filled 2021-11-15: qty 2

## 2021-11-15 MED ORDER — MAGNESIUM SULFATE 2 GM/50ML IV SOLN
2.0000 g | Freq: Once | INTRAVENOUS | Status: AC
  Administered 2021-11-15: 2 g via INTRAVENOUS
  Filled 2021-11-15: qty 50

## 2021-11-15 MED ORDER — PANTOPRAZOLE SODIUM 40 MG PO TBEC: 40.0000 mg | DELAYED_RELEASE_TABLET | Freq: Every day | ORAL | 1 refills | Status: DC

## 2021-11-15 MED ORDER — POLYETHYLENE GLYCOL 3350 17 GM/SCOOP PO POWD: 17.0000 g | Freq: Two times a day (BID) | ORAL | 0 refills | Status: DC | PRN

## 2021-11-15 MED ORDER — POTASSIUM CHLORIDE 20 MEQ PO PACK
40.0000 meq | PACK | Freq: Once | ORAL | Status: AC
  Administered 2021-11-15: 40 meq via ORAL
  Filled 2021-11-15: qty 2

## 2021-11-15 MED ORDER — METOCLOPRAMIDE HCL 5 MG PO TABS: ORAL_TABLET | ORAL | 0 refills | Status: DC

## 2021-11-15 NOTE — Progress Notes (Signed)
PT Cancellation Note  Patient Details Name: Courtney Burke MRN: [id number] DOB: 1955/12/17   Cancelled Treatment:    Reason Eval/Treat Not Completed: Other (comment)  Attempted PT session earlier, however pt politely declined, wanting to eat her lunch;  Of course, lunch is important, knowing we are watching pt's response to soft solid foods;   Noting how well pt did with OT earlier, and discussed with RN;  She is much improved and walking well;   Does not need an acute PT session before she discharges;   Roney Marion, PT  Acute Rehabilitation Services Office [phone]   [name] 11/15/2021, 2:20 PM

## 2021-11-15 NOTE — Discharge Summary (Signed)
Physician Discharge Summary  Courtney Burke FOY:[id number] DOB: 1955/07/11 DOA: 11/08/2021  PCP: [name], PA-C  Admit date: 11/08/2021 Discharge date: 11/15/2021 Admitted From: Home Disposition: Home Recommendations for Outpatient Follow-up:  Follow ups as below. Please obtain BMP and CBC at follow-up. Please follow up on the following pending results: None  Home Health: PT/OT Equipment/Devices: Not indicated  Discharge Condition: Stable CODE STATUS: Full code  Follow-up Information     [facility] Follow up.   Specialty: Rehabilitation Why: Someone will call you to schedule first home visit. Contact information: [place] [place] [address] [phone]        [name], PA-C. Schedule an appointment as soon as possible for a visit in 1 week(s).   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  Creatinine 2.2 (baseline 1.5).  CBC and UA without significant finding.  CT abdomen and pelvis without significant finding.  She was started on IV fluid and antiemetics and admitted.   Patient had episodes of emesis and abdominal pain after interval improvement.  GI consulted.  She underwent EGD on 6/13 which was basically normal.  Diet advanced as of that she tolerated.  She was cleared for discharge by gastroenterology.  She is discharged on Reglan as needed  See individual  problem list below for more.   Problems addressed during this hospitalization Principal Problem:   Intractable nausea and vomiting Active Problems:   AKI on CKD-3B   History of CVA (cerebrovascular accident)   Paroxysmal atrial fibrillation (HCC)   Esophagitis   Erosive gastritis   Essential hypertension   Gastroesophageal reflux disease without esophagitis   Hyperlipidemia, unspecified   Status post gastric bypass for obesity   Seizure disorder (HCC)   Hyponatremia   Metabolic acidosis   Normocytic anemia   Hypokalemia   Hypomagnesemia   Intractable nausea, vomiting and abdominal pain: unclear etiology of this.  She has history of gastric bypass, esophagitis, gastritis but her EGD is normal now.  She has no leukocytosis, fever or diarrhea.  CT A/P without acute finding other than previous partial gastrectomy.  Vomiting and abdominal pain resolved.  Nausea improved.  Tolerated soft diet.  Cleared for discharge by gastroenterology. -Continue p.o. Protonix 40 mg daily -Reglan as needed for nausea -Advised to continue soft diet for the next few days   AKI on CKD-3B: Likely prerenal in the setting of GI loss.  AKI resolved. -Discontinued Diovan.  Reportedly not taking before admission. -Recheck renal function at follow-up   History of CVA: has residual receptive and expressive aphasia.  S/p left MCA stent January 2022. -Continue Eliquis, aspirin, atorvastatin -Home health PT/OT ordered as recommended by therapy   Paroxysmal atrial fibrillation: in sinus rhythm.  Not on rate/rhythm control meds. -Continue Eliquis   Normocytic anemia: Initial Hgb higher than baseline likely hemoconcentration in the setting of dehydration.   Recent Labs    05/17/21 0429 05/18/21 0251 06/01/21 1225 11/08/21 1214 11/10/21 0336 11/11/21  0214 11/12/21 0450 11/13/21 0439 11/14/21 0425 11/15/21 0228  HGB 10.0* 10.3* 10.8* 14.9 11.7* 10.0* 10.5* 10.1* 9.5* 9.9*  Recheck CBC at  follow-up.  Hypokalemia and hypomagnesemia: K3.6.  Mg 1.5. -Received p.o. KCl 40 and magnesium sulfate 2 g prior to discharge   Metabolic acidosis: Resolved   Hyponatremia: Resolved.   Seizure disorder: Stable. -Continue home Keppra 1500 mg BID.   Hyperlipidemia, unspecified -Continue atorvastatin.   Essential hypertension: BP slightly elevated.  Reportedly not taking amlodipine and Diovan -Continue amlodipine -Discontinue Diovan.  Vital signs Vitals:   11/14/21 1128 11/14/21 1900 11/14/21 2207 11/15/21 0627  BP: (!) 183/98 (!) 178/96 (!) 146/67 (!) 149/78  Pulse: (!) 59 60 71 60  Temp: 97.8 F (36.6 C) 98 F (36.7 C) 97.8 F (36.6 C) 97.8 F (36.6 C)  Resp: '18  16 18  '$ Height:      Weight:      SpO2: 100% 100% 100% 100%  TempSrc: Oral Oral Oral Oral  BMI (Calculated):         Discharge exam  GENERAL: No apparent distress.  Nontoxic. HEENT: MMM.  Vision and hearing grossly intact.  NECK: Supple.  No apparent JVD.  RESP:  No IWOB.  Fair aeration bilaterally. CVS:  RRR. Heart sounds normal.  ABD/GI/GU: BS+. Abd soft, NTND.  MSK/EXT:  Moves extremities. No apparent deformity. No edema.  SKIN: no apparent skin lesion or wound NEURO: Awake and alert. Oriented fairly.  Aphasia.  No apparent focal neuro deficit. PSYCH: Appears anxious.  Discharge Instructions Discharge Instructions     Call MD for:  extreme fatigue   Complete by: As directed    Call MD for:  persistant nausea and vomiting   Complete by: As directed    Call MD for:  severe uncontrolled pain   Complete by: As directed    Diet - low sodium heart healthy   Complete by: As directed    Soft diet over the next 4 to 5 days.   Discharge instructions   Complete by: As directed    It has been a pleasure taking care of you!  You were hospitalized due to nausea and vomiting.  It is unclear what caused the symptoms.  Your endoscopy did not show any significant abnormality to explain his symptoms.  We have  started you on acid reflux medication and different nausea and vomiting medication.  Your symptoms improved to the point we think it is safe to let you go home and follow-up with your primary care doctor.  Please review your new medication list and the directions on your medications before you take them.  Continue soft diet over the next 4 to 5 days.  Recommend frequent small portion.  Follow-up with your primary care doctor in the next 1 to 2 weeks or sooner if needed.   Take care,   Increase activity slowly   Complete by: As directed       Allergies as of 11/15/2021       Reactions   Homatropine Itching   Iodinated Contrast Media Itching   Doxycycline Nausea And Vomiting   Sulfa Antibiotics Rash, Hives   Codeine Hives, Swelling   Swollen tongue   Hydrocodone Itching   Hydrocodone Bit-homatrop Mbr Itching   Ace Inhibitors Cough, Itching, Rash        Medication List     STOP taking these medications    DAYQUIL PO   omeprazole 40 MG capsule Commonly known as: PRILOSEC   ondansetron  4 MG disintegrating tablet Commonly known as: ZOFRAN-ODT   valsartan 160 MG tablet Commonly known as: DIOVAN       TAKE these medications    acetaminophen 500 MG tablet Commonly known as: TYLENOL Take 1,000 mg by mouth every 6 (six) hours as needed for moderate pain or headache.   amitriptyline 25 MG tablet Commonly known as: ELAVIL TAKE 1 TABLET BY MOUTH EVERYDAY AT BEDTIME   amLODipine 10 MG tablet Commonly known as: NORVASC Take by mouth.   aspirin EC 81 MG tablet Take 1 tablet (81 mg total) by mouth daily. Swallow whole.   atorvastatin 80 MG tablet Commonly known as: LIPITOR Take 80 mg by mouth daily.   Eliquis 5 MG Tabs tablet Generic drug: apixaban TAKE 1 TABLET BY MOUTH TWICE A DAY What changed: how much to take   Klor-Con M20 20 MEQ tablet Generic drug: potassium chloride SA Take 40 mEq by mouth 2 (two) times daily.   levETIRAcetam 750 MG tablet Commonly  known as: KEPPRA Take 2 tablets (1,500 mg total) by mouth 2 (two) times daily.   metoCLOPramide 5 MG tablet Commonly known as: Reglan Take 1 tablet (5 mg total) by mouth every 8 (eight) hours as needed for up to 10 days for nausea or vomiting.   multivitamin with minerals Tabs tablet Take 1 tablet by mouth daily.   pantoprazole 40 MG tablet Commonly known as: Protonix Take 1 tablet (40 mg total) by mouth daily.   polyethylene glycol powder 17 GM/SCOOP powder Commonly known as: MiraLax Take 17 g by mouth 2 (two) times daily as needed for moderate constipation.   senna-docusate 8.6-50 MG tablet Commonly known as: Senokot-S Take 1 tablet by mouth 2 (two) times daily between meals as needed for mild constipation.   sertraline 50 MG tablet Commonly known as: ZOLOFT Take 50 mg by mouth daily.   ursodiol 250 MG tablet Commonly known as: ACTIGALL Take 250 mg by mouth 2 (two) times daily.        Consultations: Gastroenterology  Procedures/Studies: EGD-normal   CT ABDOMEN PELVIS WO CONTRAST  Result Date: 11/08/2021 CLINICAL DATA:  Abdominal pain, nausea EXAM: CT ABDOMEN AND PELVIS WITHOUT CONTRAST TECHNIQUE: Multidetector CT imaging of the abdomen and pelvis was performed following the standard protocol without IV contrast. RADIATION DOSE REDUCTION: This exam was performed according to the departmental dose-optimization program which includes automated exposure control, adjustment of the mA and/or kV according to patient size and/or use of iterative reconstruction technique. COMPARISON:  07/02/2020 FINDINGS: Lower chest: No pleural or pericardial effusion. Improved aeration in the visualized lung bases. Hepatobiliary: No focal liver abnormality is seen. Status post cholecystectomy. No biliary dilatation. Pancreas: Unremarkable. No pancreatic ductal dilatation or surrounding inflammatory changes. Spleen: Normal in size without focal abnormality. Adrenals/Urinary Tract: No adrenal mass.  Symmetric renal contours with no urolithiasis or hydronephrosis. Urinary bladder incompletely distended. Stomach/Bowel: Changes of partial gastrectomy and gastrojejunostomy. Small bowel is nondilated. Appendix not discretely identified. The colon is incompletely distended, unremarkable. Vascular/Lymphatic: Mild scattered aortoiliac calcified atheromatous plaque without aneurysm. No abdominal or pelvic adenopathy. Reproductive: Uterus and bilateral adnexa are unremarkable. Other: No ascites.  Bilateral pelvic phleboliths.  No free air. Musculoskeletal: No acute or significant osseous findings. IMPRESSION: 1. No acute findings. 2. Post partial gastrectomy 3.  Aortic Atherosclerosis (ICD10-170.0). Electronically Signed   By: Lucrezia Europe M.D.   On: 11/08/2021 17:26   CUP PACEART REMOTE DEVICE CHECK  Result Date: 10/26/2021 ILR summary report received. Battery status OK. Normal  device function. No new symptom, tachy, brady, or pause episodes. No new AF episodes. AF burden is 0% of the time.  Monthly summary reports and ROV/PRN Kathy Breach, RN, CCDS, [employer]      The results of significant diagnostics from this hospitalization (including imaging, microbiology, ancillary and laboratory) are listed below for reference.     Microbiology: No results found for this or any previous visit (from the past 240 hour(s)).   Labs:  CBC: Recent Labs  Lab 11/11/21 0214 11/12/21 0450 11/13/21 0439 11/14/21 0425 11/15/21 0228  WBC 5.1 6.2 4.2 5.1 3.9*  HGB 10.0* 10.5* 10.1* 9.5* 9.9*  HCT 30.2* 30.0* 29.9* 27.0* 28.6*  MCV 85.3 83.3 85.2 82.1 82.4  PLT 341 370 376 366 374   BMP &GFR Recent Labs  Lab 11/11/21 0214 11/12/21 0450 11/13/21 0439 11/14/21 0425 11/15/21 0228  NA 140 139 142 139 139  K 4.0 3.1* 4.4 4.2 3.6  CL 114* 115* 119* 117* 113*  CO2 21* 18* 19* 19* 22  GLUCOSE 81 92 76 74 74  BUN 18 10 7* 6* <5*  CREATININE 1.50* 1.30* 1.36* 1.21* 1.26*  CALCIUM 8.5* 8.2* 8.4* 8.4*  8.5*  MG 1.8 1.6* 1.9 1.7 1.5*  PHOS 3.0 3.1 2.5 2.4* 2.3*   Estimated Creatinine Clearance: 37.5 mL/min (A) (by C-G formula based on SCr of 1.26 mg/dL (H)). Liver & Pancreas: Recent Labs  Lab 11/09/21 0305 11/10/21 0336 11/11/21 0214 11/12/21 0450 11/13/21 0439 11/14/21 0425 11/15/21 0228  AST 44*  --   --  16  --   --   --   ALT 19  --   --  13  --   --   --   ALKPHOS 138*  --   --  117  --   --   --   BILITOT 0.8  --   --  0.5  --   --   --   PROT 6.3*  --   --  5.2*  --   --   --   ALBUMIN 3.3*   < > 2.6* 2.7* 2.5* 2.5* 2.7*   < > = values in this interval not displayed.   Recent Labs  Lab 11/10/21 0336  LIPASE 31   No results for input(s): "AMMONIA" in the last 168 hours. Diabetic: No results for input(s): "HGBA1C" in the last 72 hours. Recent Labs  Lab 11/09/21 1125  GLUCAP 104*   Cardiac Enzymes: Recent Labs  Lab 11/10/21 0336  CKTOTAL 93   No results for input(s): "PROBNP" in the last 8760 hours. Coagulation Profile: No results for input(s): "INR", "PROTIME" in the last 168 hours. Thyroid Function Tests: No results for input(s): "TSH", "T4TOTAL", "FREET4", "T3FREE", "THYROIDAB" in the last 72 hours. Lipid Profile: No results for input(s): "CHOL", "HDL", "LDLCALC", "TRIG", "CHOLHDL", "LDLDIRECT" in the last 72 hours. Anemia Panel: No results for input(s): "VITAMINB12", "FOLATE", "FERRITIN", "TIBC", "IRON", "RETICCTPCT" in the last 72 hours. Urine analysis:    Component Value Date/Time   COLORURINE YELLOW 11/08/2021 1250   APPEARANCEUR HAZY (A) 11/08/2021 1250   LABSPEC 1.013 11/08/2021 1250   PHURINE 5.0 11/08/2021 1250   GLUCOSEU NEGATIVE 11/08/2021 1250   HGBUR SMALL (A) 11/08/2021 1250   BILIRUBINUR NEGATIVE 11/08/2021 1250   KETONESUR 5 (A) 11/08/2021 1250   PROTEINUR NEGATIVE 11/08/2021 1250   NITRITE NEGATIVE 11/08/2021 1250   LEUKOCYTESUR TRACE (A) 11/08/2021 1250   Sepsis Labs: Invalid input(s): "PROCALCITONIN",  "LACTICIDVEN"   SIGNED:  Mercy Riding, MD  [facility] 11/15/2021, 4:15 PM

## 2021-11-15 NOTE — TOC Transition Note (Signed)
Transition of Care [facility]) - CM/SW Discharge Note   Patient Details  Name: Courtney Burke MRN: [id number] Date of Birth: August 25, 1955  Transition of Care [facility]) CM/SW Contact:  Tom-Johnson, Renea Ee, RN Phone Number: 11/15/2021, 1:04 PM   Clinical Narrative:     Patient is scheduled for discharge today. Home health disciplines with [facility]. Info on AVS. Daughter to transport at discharge. No further TOC needs noted.   Final next level of care: [place] Barriers to Discharge: Continued Medical Work up   Patient Goals and CMS Choice Patient states their goals for this hospitalization and ongoing recovery are:: To return home CMS Medicare.gov Compare Post Acute Care list provided to:: Patient    Discharge Placement                       Discharge Plan and Services   Discharge Planning Services: CM Consult            DME Arranged: N/A DME Agency: NA       HH Arranged: PT, OT HH Agency: [place] Date [place]: 11/13/21 Time [place]: 1410 Representative spoke with at [place]: Amy  Social Determinants of Health ([place]) Interventions     Readmission Risk Interventions    11/15/2021    1:04 PM  Readmission Risk Prevention Plan  Transportation Screening Complete  PCP or Specialist Appt within 3-5 Days Complete  HRI or [place] Complete  Social Work Consult for [place] Planning/Counseling Complete  Palliative Care Screening Not Applicable  Medication Review [employer]) Complete

## 2021-11-15 NOTE — Progress Notes (Signed)
Occupational Therapy Treatment Patient Details Name: Courtney Burke MRN: [id number] DOB: 1955/06/25 Today's Date: 11/15/2021   History of present illness 66 y/o female presented to ED on 11/08/21 for persistent nausea and vomiting x 1 week. Admitted for AKI on CKD. PMH: L MCA stroke s/p stenting, seizures on keppra, expressive aphasia, HTN, gastric sleeve surgery, CKD3B,T2DM   OT comments  Patient making good progress with OT treatment. Patient able to get to EOB and was setup with supervision to perform UB and LB dressing. Patient stood at sink to perform grooming and UB bathing with setup and supervision. Patient performed mobility in hallway and in room without an assistive device and supervision to min guard assist. Patient is making good progress and would benefit from [facility] to increase safety in home with mobility and self care tasks.    Recommendations for follow up therapy are one component of a multi-disciplinary discharge planning process, led by the attending physician.  Recommendations may be updated based on patient status, additional functional criteria and insurance authorization.    Follow Up Recommendations  Home health OT    Assistance Recommended at Discharge Intermittent Supervision/Assistance  Patient can return home with the following  A little help with walking and/or transfers;A little help with bathing/dressing/bathroom;Assistance with cooking/housework;Direct supervision/assist for medications management;Direct supervision/assist for financial management;Assist for transportation;Help with stairs or ramp for entrance   Equipment Recommendations  None recommended by OT    Recommendations for Other Services      Precautions / Restrictions Precautions Precautions: Fall Restrictions Weight Bearing Restrictions: No       Mobility Bed Mobility Overal bed mobility: Modified Independent             General bed mobility comments: able to get to EOB without  assistance    Transfers Overall transfer level: Needs assistance Equipment used: None Transfers: Sit to/from Stand Sit to Stand: Supervision           General transfer comment: min guard for safety     Balance Overall balance assessment: Needs assistance Sitting-balance support: No upper extremity supported, Feet supported Sitting balance-Leahy Scale: Good     Standing balance support: No upper extremity supported, Single extremity supported, During functional activity Standing balance-Leahy Scale: Fair Standing balance comment: able to stand at sink for grooming                           ADL either performed or assessed with clinical judgement   ADL Overall ADL's : Needs assistance/impaired     Grooming: Wash/dry hands;Wash/dry face;Oral care;Applying deodorant;Supervision/safety;Standing Grooming Details (indicate cue type and reason): at sink Upper Body Bathing: Supervision/ safety;Standing Upper Body Bathing Details (indicate cue type and reason): at sink     Upper Body Dressing : Supervision/safety;Sitting Upper Body Dressing Details (indicate cue type and reason): donned scrub top Lower Body Dressing: Min guard;Sit to/from stand Lower Body Dressing Details (indicate cue type and reason): changed socks and donned scrub pants             Functional mobility during ADLs: Supervision/safety General ADL Comments: ambualted without AD    Extremity/Trunk Assessment              Vision       Perception     Praxis      Cognition Arousal/Alertness: Awake/alert Behavior During Therapy: WFL for tasks assessed/performed Overall Cognitive Status: History of cognitive impairments - at baseline  General Comments: expressive aphasia, followed commands well        Exercises      Shoulder Instructions       General Comments      Pertinent Vitals/ Pain       Pain Assessment Pain Assessment:  No/denies pain Pain Intervention(s): Monitored during session  Home Living                                          Prior Functioning/Environment              Frequency  Min 2X/week        Progress Toward Goals  OT Goals(current goals can now be found in the care plan section)  Progress towards OT goals: Progressing toward goals  Acute Rehab OT Goals Patient Stated Goal: go home OT Goal Formulation: With patient Time For Goal Achievement: 11/24/21 Potential to Achieve Goals: Good ADL Goals Pt Will Perform Grooming: with modified independence;standing Pt Will Perform Lower Body Bathing: with modified independence;sitting/lateral leans;sit to/from stand Pt Will Perform Lower Body Dressing: with modified independence;sitting/lateral leans;sit to/from stand Pt Will Transfer to Toilet: with modified independence;ambulating Pt Will Perform Toileting - Clothing Manipulation and hygiene: with modified independence;sitting/lateral leans;sit to/from stand  Plan Discharge plan remains appropriate    Co-evaluation                 AM-PAC OT "6 Clicks" Daily Activity     Outcome Measure   Help from another person eating meals?: None Help from another person taking care of personal grooming?: A Little Help from another person toileting, which includes using toliet, bedpan, or urinal?: A Little Help from another person bathing (including washing, rinsing, drying)?: A Little Help from another person to put on and taking off regular upper body clothing?: A Little Help from another person to put on and taking off regular lower body clothing?: A Little 6 Click Score: 19    End of Session Equipment Utilized During Treatment: Gait belt  OT Visit Diagnosis: Unsteadiness on feet (R26.81);Other abnormalities of gait and mobility (R26.89);Muscle weakness (generalized) (M62.81)   Activity Tolerance Patient tolerated treatment well   Patient Left in chair;with  call bell/phone within reach;with chair alarm set   Nurse Communication Mobility status        Time: 5885-0277 OT Time Calculation (min): 23 min  Charges: OT General Charges $OT Visit: 1 Visit OT Treatments $Self Care/Home Management : 23-37 mins  Lodema Hong, [place]  Pager [phone] Office [place] 11/15/2021, 9:57 AM

## 2021-11-15 NOTE — Progress Notes (Signed)
   Patient Name: Courtney Burke Date of Encounter: 11/15/2021, 12:25 PM    Subjective  Tolerating dysphagia 3 diet   Objective  BP (!) 149/78 (BP Location: Right Arm)   Pulse 60   Temp 97.8 F (36.6 C) (Oral)   Resp 18   Ht '5\' 2"'$  (1.575 m)   Wt 61.8 kg   LMP 09/28/2012   SpO2 100%   BMI 24.92 kg/m   NAD   Assessment and Plan  Persistent nausea and vomiting - improved/resolved. Cause not clear  GI signing off No GI outpatient appointment needed w/ [place] she has been a patient w/ [facility] and should f/u there prn    Gatha Mayer, MD, [facility] See Shea Evans on call - gastroenterology for best contact person 11/15/2021 12:25 PM

## 2021-11-16 ENCOUNTER — Encounter (HOSPITAL_COMMUNITY): Payer: Self-pay | Admitting: Internal Medicine

## 2021-11-28 ENCOUNTER — Ambulatory Visit (INDEPENDENT_AMBULATORY_CARE_PROVIDER_SITE_OTHER): Payer: Medicare Other

## 2021-11-28 DIAGNOSIS — I639 Cerebral infarction, unspecified: Secondary | ICD-10-CM

## 2021-11-28 LAB — CUP PACEART REMOTE DEVICE CHECK
Date Time Interrogation Session: 20230626231534
Implantable Pulse Generator Implant Date: 20220407

## 2021-11-30 ENCOUNTER — Ambulatory Visit (INDEPENDENT_AMBULATORY_CARE_PROVIDER_SITE_OTHER): Payer: Medicare Other | Admitting: Adult Health

## 2021-11-30 ENCOUNTER — Encounter: Payer: Self-pay | Admitting: Adult Health

## 2021-11-30 VITALS — BP 123/77 | HR 72 | Ht 62.0 in | Wt 144.0 lb

## 2021-11-30 DIAGNOSIS — R569 Unspecified convulsions: Secondary | ICD-10-CM

## 2021-11-30 DIAGNOSIS — I69320 Aphasia following cerebral infarction: Secondary | ICD-10-CM

## 2021-11-30 DIAGNOSIS — I63411 Cerebral infarction due to embolism of right middle cerebral artery: Secondary | ICD-10-CM

## 2021-11-30 DIAGNOSIS — G441 Vascular headache, not elsewhere classified: Secondary | ICD-10-CM | POA: Diagnosis not present

## 2021-11-30 DIAGNOSIS — I69398 Other sequelae of cerebral infarction: Secondary | ICD-10-CM | POA: Diagnosis not present

## 2021-11-30 DIAGNOSIS — Z5181 Encounter for therapeutic drug level monitoring: Secondary | ICD-10-CM

## 2021-11-30 DIAGNOSIS — G40919 Epilepsy, unspecified, intractable, without status epilepticus: Secondary | ICD-10-CM

## 2021-11-30 DIAGNOSIS — E785 Hyperlipidemia, unspecified: Secondary | ICD-10-CM

## 2021-11-30 MED ORDER — LEVETIRACETAM 750 MG PO TABS: 1500.0000 mg | ORAL_TABLET | Freq: Two times a day (BID) | ORAL | 6 refills | Status: DC

## 2021-11-30 MED ORDER — ATORVASTATIN CALCIUM 80 MG PO TABS: 80.0000 mg | ORAL_TABLET | Freq: Every day | ORAL | 3 refills | Status: DC

## 2021-11-30 MED ORDER — AMITRIPTYLINE HCL 50 MG PO TABS: 50.0000 mg | ORAL_TABLET | Freq: Every day | ORAL | 5 refills | Status: DC

## 2021-11-30 NOTE — Patient Instructions (Addendum)
Increase amitriptyline to '50mg'$  nightly  Restart atorvastatin '80mg'$  daily  Continue keppra '1500mg'$  twice daily for seizure prevention   Continue Eliquis (apixaban) daily for secondary stroke prevention  Continue to follow up with PCP regarding cholesterol and blood pressure management  Maintain strict control of hypertension with blood pressure goal below 130/90, diabetes with hemoglobin A1c goal below 7.0 % and cholesterol with LDL cholesterol (bad cholesterol) goal below 70 mg/dL.   Signs of a Stroke? Follow the BEFAST method:  Balance Watch for a sudden loss of balance, trouble with coordination or vertigo Eyes Is there a sudden loss of vision in one or both eyes? Or double vision?  Face: Ask the person to smile. Does one side of the face droop or is it numb?  Arms: Ask the person to raise both arms. Does one arm drift downward? Is there weakness or numbness of a leg? Speech: Ask the person to repeat a simple phrase. Does the speech sound slurred/strange? Is the person confused ? Time: If you observe any of these signs, call 911.    Followup in the future with me in 4 months or call earlier if needed       Thank you for coming to see [place] at [facility]. I hope we have been able to provide you high quality care today.  You may receive a patient satisfaction survey over the next few weeks. We would appreciate your feedback and comments so that we may continue to improve ourselves and the health of our patients.

## 2021-11-30 NOTE — Progress Notes (Signed)
[facility] [address] [place]. [place] [address] [phone]       OFFICE FOLLOW-UP NOTE  [name] Date of Birth:  [birth year] Medical Record Number:  [id number]    Reason for visit: Stroke, seizure, headaches Primary neurologist: Dr. Leonie Man  Chief Complaint  Patient presents with   Cerebrovascular Accident    Rm 3, 3 month FU  dgtr- Rich Reining "still having headaches- amitriptyline/sertraline have helped a lot; we were told she has a blockage on right side of brain"     HPI:   Update 11/30/2021 JM: Patient returns for follow-up visit accompanied by her daughter.  Previously seen 3 months ago with complaints of worsening headache as well as breakthrough seizure.   Keppra dosage increased to 1500 mg twice daily, tolerating without side effects, denies any recurrent seizure activity.    Reports continued daily frontal headaches 2-3x per day, usually short lasting, takes tylenol '1000mg'$  of tyneol 2-3x daily which resolves headaches.  Remains on amitriptyline 25 mg nightly, denies side effects.  Stable from stroke standpoint without new stroke/[name] symptoms.  Residual speech deficit stable.  Compliant on Eliquis, and aspirin, denies side effects. Has had difficulty obtaining amlodipine and atorvastatin refill, has been out over the past 3 months, reports PCP will not refill as she did not start medications.  blood pressure today 123/77. Monitors at home and has been stable.   Was hospitalized 6/7 through 6/14 for 1 week nausea/vomiting and intermittent abdominal pain.  No clear source identified.  Did note dehydration with elevated creatinine and anemia, hypokalemia, hypomagnesemia and hyponatremia. She has not had any additional n/v or abdominal pains.  No further concerns at this time     History provided for reference purposes only Update 08/28/2021 JM: Returns for acute visit due to complaints of worsening headaches.  At prior visit 2 months ago,  she was started on amitriptyline for headache complaints since her stroke in 05/2021. Has been taking amitriptyline '25mg'$  nightly - per daughter, thought they were improving as no mention of having headaches since starting amitriptyline but at cardiology visit last week, was complaining of headache and not feeling right upon arriving to appt. She was able to ambulate back but walking slow, sat down and then stopped talking. Mouth was twisted towards the side, teeth clenched, and lost consciousness for 2-3 minutes, she did have postictal confusion but improved after a few minutes. She was not sent to ED as she recovered back to baseline within reasonable time. No additional seizures since that time. She has been complaining of a headache since her seizure but no additional headaches over the past 2 days. Denies any new/worsening speech/language difficulties, weakness, vision changes or mental status changes. Daughter does report starting on sertraline '50mg'$  daily by psychiatry 2 weeks ago for depression, no other medication changes. No recent illness, trauma, falls, or other potential provoking factors. She does not drink alcohol or use recreational drugs.  Reports compliance with all medications.  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 fib after presenting with headache, worsening speech and confusion. MRA right ICA 50% stenosis and severe proximal right M2 stenosis, L M1 stent patent, no LVO.  On Eliquis twice daily and Brilinta PTA recommended continuation as well as adding aspirin 81 mg daily.  LDL 21 on atorvastatin.  A1c 5.5.  EEG generalized slowing but no seizures - remained on keppra.  Discharged 12/7. Returned on 12/12 after episode of vomiting and syncopal episode with MRI showing extension of recent R MCA stroke likely in setting of hypotension/syncope in setting of right ICA and M2  stenosis. Continued Eliquis and asa -Brilinta discontinued.  Concern of possible seizure on 12/14 with staring off and not responsive, confusion for a few minutes after therefore increased Keppra to 1g BID. LDL 21 - decreased atorvastatin to 40 mg daily. BP stabilized on IVF, orthostatics unremarkable.  Residual expressive aphasia with word salad. Therapy evals recommended OP therapies.    Today she is accompanied by her daughter, Courtney Burke. Having headaches since recent stroke. Will take tylenol with some benefit but will return. Continues to work with SLP noting improvement, family working with her on reading out loud and conversations. Per daughter, patient will push herself too much at times and try to do too much at once. Family tries to ensure she takes breaks. Denies weakness, some imbalance but overall stable. Does c/o fear of additional strokes and difficulty sleeping at night due to mind wandering. No prior issues with depression/anxiety.   Reports compliance on Eliquis 5 mg twice daily and atorvastatin 40 mg daily without side effects. Has not been taking aspirin.  Blood pressure today 151/78 and on recheck, 132/82.  Compliant on Keppra but over past week has been taking '500mg'$  BID due to difficulty refilling new dose (had '500mg'$  tabs left over from prior prescription). Was doing well on '1000mg'$  BID.   Of note, diagnosed with COVID-19 12/29 after presenting to ED with lightheadedness, dizziness, nausea and vomiting and possible loss of consciousness or near syncope.  Extensive work-up largely unrevealing except COVID-positive which was felt to be contributing to her symptoms. MR brain and MRA head/neck no acute findings or changes from prior recent imaging.   No further concerns at this time     PERTINENT IMAGING  Per hospitalization 05/15/2021 CT head - Slightly increased hypodensity in the right posterior temporal lobe and lateral occipital lobe, consistent with evolution of previously noted  acute infarct. MRI  Extension of the acute infarction in the right MCA territory at the right temporoparietal junction, particularly along the inferior margin of the infarction and with a few tiny punctate satellite foci additionally present. MRA 12/7 - Approximate 50% stenosis at the supraclinoid right ICA, with probable additional short-segment severe proximal right M2 stenosis.  EEG cortical dysfunction arising from left and right temporal region, no seizure   Per hospitalization 05/10/2021 CT head Old left MCA territory strokes. Newly seen loss of gray-white differentiation at the right temporoparietal junction suggesting acute infarction in that region today. No hemorrhage or mass effect MRI  1. Acute ischemic nonhemorrhagic infarct involving the right temporoccipital junction, posterior right MCA distribution.2. Chronic left MCA territory infarcts, stable from prior.3. Additional small remote right cerebellar infarct.4. Underlying age-related cerebral atrophy with mild chronic small vessel ischemic disease. MRA  head1. Negative intracranial MRA for large vessel occlusion.2. Approximate 50% stenosis at the supraclinoid right ICA, with probable additional short-segment severe proximal right M2 stenosis.3. Vascular stent in place within the left M1 segment. Flow through the stent itself not evaluated by MRA, although patent flow seen distal to the stent.4. Short-segment severe left M2 stenosis, similar to previous.5. Wide patency of the posterior circulation MRA Neck Wide patency of both carotid artery systems and vertebral arteries within the neck. No hemodynamically significant stenosis or other acute vascular abnormality. 2D Echo EF 55 to 60% EEG generalized slowing.  No seizures identified  Update 02/13/2021 JM: Ms. Fuquay returns for acute visit in setting of recent seizure activity.  She is accompanied by her brother.  Seizure type event 02/07/2021 evaluated in ED with symptoms of headache  and then tonic-clonic jerking lasting 5 to 6 minutes with residual confusion and bowel incontinence.  Initiated Keppra 500 mg twice daily. CT head unremarkable.  No additional events since discharge.  Remains on Keppra 500 mg twice daily tolerating without side effects.  Reports slight worsening of speech since seizure event.  Was working with SLP and PT prior to seizure but has not yet restarted.  Continues to use a cane and denies any recent falls.  Denies new stroke/[name] symptoms.  Remains on Eliquis and atorvastatin.  Also compliant on Brilinta per IR recommendations.  No further concerns at this time.  Update 01/04/2021 JM: Ms. Bagby returns for 73-monthstroke follow-up accompanied by her brother, JMerry Proud  Working with SLP for residual aphasia but notes ongoing improvement. Gait has also been improving working with PT - uses cane - did have a fall last month but thankfully without injury.  Denies new stroke/[name] symptoms. ILR showed evidence of atrial fibrillation on 10/05/2020 -Eliquis initiated in addition to [place] ASA d/c'd.  She does have mild bruising but otherwise tolerating.  Has not yet scheduled diagnostic angiogram with Dr. DEstanislado Pandy  Compliant on atorvastatin without associated side effects.  Blood pressure 136/82. Monitors at home and typically stable 120s/70s.  No further concerns at this time.  Initial visit 10/05/2020 Dr. SLeonie Man Ms. DStrussis a 66year old African-American lady seen today for initial office follow-up visit following hospital admission for stroke in January 2022.  She is accompanied by her daughter.  History is obtained from them and review of hospital electronic medical records and I personally reviewed imaging films in PACS.  She is a 66year old pleasant African-American lady with past medical history of diabetes, hypertension, chronic kidney disease stage IIIa, dysphagia, obesity, s/p gastric sleeve surgery in December 2021.  She was brought in as a code stroke by EMS after  she developed altered mental status.  She had a witnessed episodes in which her eyes rolled back in her head twice with brief loss of consciousness.  She is subsequently found to have right-sided weakness.  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  Aspect score was 6 on admission.  Infarct core was 11 to 20 cc only.  After discussion of risk benefits with the patient's family patient was taken for emergent mechanical thrombectomy which was performed by Dr. DEstanislado Pandybut there was underlying MCA stenosis requiring rescue MCA stenting with resultant TICI 2C revascularization.  She was admitted admitted to the intensive care unit blood pressure tightly controlled.  She was subsequently extubated and found to have global aphasia and right hemiparesis.  MRI scan showed acute infarct involving left MCA territory of basal ganglia as well as left patchy temporal frontal and parietal lobes.  There is small area of hemorrhage noted in the left posterior temporal lobe.  2D echo showed ejection fraction of 6065%.  Lower extremity venous Dopplers negative for DVT.  LDL cholesterol 68 mg percent.  Hemoglobin A1c was 5.4.  Patient was started on aspirin and Brilinta for MCA stent.  History of dysphagia and had a feeding tube inserted.  She was transferred to inpatient rehab where she stayed for few weeks.  She also started on Keppra for seizure prophylaxis due to her episodes of seizure-like presentation.  EEG she had shown intermittent rhythmic delta slowing in the left frontal region.  Patient has been at home since rehab.  She is to get getting physical occupational and speech therapy which is now down to 1 day a week.  She is able to speak better but still has to speak  slowly and occasionally gets stuck and then some word finding difficulties.  She has noticed improvement in right-sided strength and now she can walk independently but needs a walker.  She still has some dragging of the right foot.  Her balance is poor.  However she has had no falls or injuries.  She is able to manage most activities of daily living but needs a little supervision.  She is tolerating aspirin and Brilinta well with only minor bruising and no bleeding.  Blood pressures well controlled today it is elevated in the office at 153/75.  She remains on Lipitor which is tolerating well without muscle aches and pains.    ROS:   14 system review of systems is positive for those listed in HPI and all other systems negative  PMH:  Past Medical History:  Diagnosis Date   Acute ischemic left MCA stroke ([place]) 06/30/2020   Acute ischemic stroke (HCC)    Acute stroke due to occlusion of left middle cerebral artery ([place]) 06/30/2020   Age-related nuclear cataract of both eyes 09/28/2015   Arthralgia of multiple joints 09/28/2015   Asthma    Borderline diabetes    Cerebrovascular accident (CVA) ([place])    Chronic bilateral thoracic back pain 06/05/2018   Chronic renal insufficiency, stage 1 08/11/2014   Diabetes mellitus type 2 in obese ([place])    Dyslipidemia    Dysphagia, post-stroke    Erosive gastritis 07/20/2019   Formatting of this note might be different from the original. On EGD 07/2019   Esophagitis 07/20/2019   Formatting of this note might be different from the original. Added automatically from request for surgery [id number]  Formatting of this note might be different from the original. On EGD 07/2019   Essential hypertension 09/28/2015   Essential thrombocytosis ([place]) 09/28/2015   Gallstones 08/18/2019   Formatting of this note might be different from the original. Added automatically from request for surgery [id number]   Gastroesophageal reflux disease without esophagitis 09/28/2015    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   Hyperlipidemia, unspecified 03/16/2013   Hypertension    Keratoconjunctivitis sicca of both eyes not specified as Sjogren's 09/28/2015   Left middle cerebral artery stroke ([place]) 07/09/2020   Morbid obesity ([place]) 05/21/2013   Seizures ([place])    Stage 3b chronic kidney disease ([place])    Status post gastric bypass for obesity 09/28/2015    Social History:  Social History   Socioeconomic History   Marital status: Divorced    Spouse name: Not on file   Number of children: Not on file   Years of education: Not on file   Highest education level: Not on file  Occupational History   Not on file  Tobacco Use   Smoking status: Never   Smokeless tobacco: Never  Vaping Use   Vaping Use: Never used  Substance and Sexual Activity   Alcohol use: No   Drug use: No   Sexual activity: Not on  file  Other Topics Concern   Not on file  Social History Narrative   Lives with daughter Courtney Burke   Right Katy no caffeine   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[place] Strain: [place]  (07/18/2021)   Overall Financial Resource Strain (CARDIA)    Difficulty of Paying Living Expenses: Not hard at all  Food Insecurity: No Food Insecurity (07/18/2021)   Hunger Vital Sign    Worried About Running Out of Food in the Last Year: Never true    [place] in the Last Year: Never true  Transportation Needs: No Transportation Needs (07/18/2021)   PRAPARE - Hydrologist (Medical): No    Lack of Transportation (Non-Medical): No  Physical Activity: Not on file  Stress: No Stress Concern Present (05/24/2021)   [place]    Feeling of Stress : Only a little  Social Connections: Moderately Integrated (05/24/2021)   Social Connection and Isolation Panel [NHANES]     Frequency of Communication with Friends and Family: More than three times a week    Frequency of Social Gatherings with Friends and Family: More than three times a week    Attends Religious Services: 1 to 4 times per year    Active Member of [employer] or Organizations: Yes    Attends Archivist Meetings: 1 to 4 times per year    Marital Status: Divorced  Human resources officer Violence: Not At Risk (06/30/2021)   Humiliation, Afraid, Rape, and Kick questionnaire    Fear of Current or Ex-Partner: No    Emotionally Abused: No    Physically Abused: No    Sexually Abused: No    Medications:   Current Outpatient Medications on File Prior to Visit  Medication Sig Dispense Refill   acetaminophen (TYLENOL) 500 MG tablet Take 1,000 mg by mouth every 6 (six) hours as needed for moderate pain or headache.     amitriptyline (ELAVIL) 25 MG tablet TAKE 1 TABLET BY MOUTH EVERYDAY AT BEDTIME 90 tablet 1   apixaban (ELIQUIS) 5 MG TABS tablet TAKE 1 TABLET BY MOUTH TWICE A DAY (Patient taking differently: Take 5 mg by mouth 2 (two) times daily.) 60 tablet 5   aspirin EC 81 MG tablet Take 1 tablet (81 mg total) by mouth daily. Swallow whole.     KLOR-CON M20 20 MEQ tablet Take 40 mEq by mouth 2 (two) times daily.     levETIRAcetam (KEPPRA) 750 MG tablet Take 2 tablets (1,500 mg total) by mouth 2 (two) times daily. 120 tablet 5   metoCLOPramide (REGLAN) 5 MG tablet Take 1 tablet (5 mg total) by mouth every 8 (eight) hours as needed for up to 10 days for nausea or vomiting. 30 tablet 0   Multiple Vitamin (MULTIVITAMIN WITH MINERALS) TABS tablet Take 1 tablet by mouth daily.     pantoprazole (PROTONIX) 40 MG tablet Take 1 tablet (40 mg total) by mouth daily. 30 tablet 1   sertraline (ZOLOFT) 50 MG tablet Take 50 mg by mouth daily.     ursodiol (ACTIGALL) 250 MG tablet Take 250 mg by mouth 2 (two) times daily.     amLODipine (NORVASC) 10 MG tablet Take by mouth. (Patient not taking: Reported on 11/08/2021)      atorvastatin (LIPITOR) 80 MG tablet Take  80 mg by mouth daily. (Patient not taking: Reported on 11/30/2021)     polyethylene glycol powder (MIRALAX) 17 GM/SCOOP powder Take 17 g by mouth 2 (two) times daily as needed for moderate constipation. (Patient not taking: Reported on 11/30/2021) 255 g 0   senna-docusate (SENOKOT-S) 8.6-50 MG tablet Take 1 tablet by mouth 2 (two) times daily between meals as needed for mild constipation. (Patient not taking: Reported on 11/30/2021) 180 tablet 0   No current facility-administered medications on file prior to visit.    Allergies:   Allergies  Allergen Reactions   Homatropine Itching   Iodinated Contrast Media Itching   Doxycycline Nausea And Vomiting   Sulfa Antibiotics Rash and Hives   Codeine Hives and Swelling    Swollen tongue   Hydrocodone Itching   Hydrocodone Bit-Homatrop Mbr Itching   Ace Inhibitors Cough, Itching and Rash    Physical Exam Today's Vitals   11/30/21 1417  BP: 123/77  Pulse: 72  Weight: 144 lb (65.3 kg)  Height: '5\' 2"'$  (1.575 m)    Body mass index is 26.34 kg/m.    General: well developed, well nourished very pleasant middle-aged African-American lady, seated, in no evident distress Head: head normocephalic and atraumatic.  Neck: supple with no carotid or supraclavicular bruits Cardiovascular: regular rate and rhythm, no murmurs Musculoskeletal: no deformity Skin:  no rash/petichiae Vascular:  Normal pulses all extremities  Neurologic Exam Mental Status: Awake and fully alert.  moderate expressive > mild receptive aphasia.  Able to follow simple step commands without difficulty.  Improvement of comprehension although some impairment.  Oriented to place and person.  Recent memory impaired and remote memory intact. Attention span, concentration and fund of knowledge appropriate during visit. Mood and affect appropriate.   Cranial Nerves: Pupils equal, briskly reactive to light. Extraocular movements full without  nystagmus. Visual fields full to confrontation. Hearing intact. Facial sensation intact.  Face, tongue, palate moves normally and symmetrically.  Motor: Normal bulk and tone. Normal strength in all tested extremity muscles.  Sensory.: intact to touch ,pinprick .position and vibratory sensation.  Coordination: Rapid alternating movements normal in all extremities. Finger-to-nose and heel-to-shin performed accurately bilaterally. Gait and Station: Arises from chair without difficulty. Stance is normal. Gait demonstrates normal stride length with mild imbalance initially but stabilized without assistive device.   Reflexes: 1+ and symmetric. Toes downgoing.       ASSESSMENT/PLAN: 66 year old lady with left MCA infarct due to left MCA occlusion s/p rescue stenting 06/2020 s/p ILR and seizures placed on Keppra. ILR showed A fib 10/2020 - placed on Eliquis. Additional seizure 02/2021, restarted Keppra. R MCA temporal occipital junction infarct 05/2021 likely from A. fib with extension 05/15/2021 and seizure activity - increased Keppra dosage. Vascular risk factors of HLD, atrial fibrillation, HTN, and intracranial stenosis.  Breakthrough seizure 3/23, unable to confirm provoking event therefore Keppra dosage increased.     1.  Seizure activity likely late effect of stroke 2. Breakthrough seizure -No additional seizures -Continue Keppra 1500 mg twice daily -Obtain CMP and CBC -EEG 09/07/2021 normal -Discussed seizure provoking triggers and importance of avoidance -advised to call with any seizure activity    3. Vascular headaches -Increase amitriptyline to 50 mg nightly -discussed potential side effects and advised to call after 1 to 2 weeks if no benefit or sooner if any difficulty tolerating -advised to call with any worsening of headaches or proceed to ED/call 911 with any severe headache or associated with red flag signs/symptoms -Repeat CBC, CMP  and mag level   4.  Right MCA stroke 5. Hx of L  MCA stroke -Continue language deficits stable -continue Eliquis 5 mg twice daily, asa '81mg'$  daily and atorvastatin 80 mg daily for secondary stroke prevention measures - medications managed by PCP/cardiology - did refill atorvastatin today but ongoing management can appropriately be managed by PCP.  -No indication to restart amlodipine as blood pressure stable and importance of avoiding hypotension in setting of intracranial stenosis -advised to continue to closely monitor at home and to contact PCP/cardiology if she starts to have elevated levels -s/p MCA stent followed by IR -maintain strict control of hypertension with blood pressure goal below 130/90 and lipids with LDL cholesterol goal below 70 mg/dL.  -Stroke labs 05/11/2021 - LDL 21, A1c 5.5 -Repeat lipid panel today    Follow-up in 4 months or call earlier if needed   CC:  [name], PA-C   I spent 36 minutes of face-to-face and non-face-to-face time with patient and daughter.  This included previsit chart review, lab review, study review, order entry, electronic health record documentation, patient and daughter education and discussion regarding above diagnoses and answered all other questions to patient and daughters satisfaction   Frann Rider, [facility]  [facility] [address] [place], [place] [address]  Phone [phone] Fax [phone] Note: This document was prepared with digital dictation and possible smart phrase technology. Any transcriptional errors that result from this process are unintentional.

## 2021-12-01 LAB — CBC WITH DIFFERENTIAL/PLATELET
Basophils Absolute: 0 10*3/uL (ref 0.0–0.2)
Basos: 1 %
EOS (ABSOLUTE): 0.2 10*3/uL (ref 0.0–0.4)
Eos: 4 %
Hematocrit: 34.1 % (ref 34.0–46.6)
Hemoglobin: 11.3 g/dL (ref 11.1–15.9)
Immature Grans (Abs): 0 10*3/uL (ref 0.0–0.1)
Immature Granulocytes: 0 %
Lymphocytes Absolute: 1.7 10*3/uL (ref 0.7–3.1)
Lymphs: 46 %
MCH: 28.2 pg (ref 26.6–33.0)
MCHC: 33.1 g/dL (ref 31.5–35.7)
MCV: 85 fL (ref 79–97)
Monocytes Absolute: 0.3 10*3/uL (ref 0.1–0.9)
Monocytes: 7 %
Neutrophils Absolute: 1.6 10*3/uL (ref 1.4–7.0)
Neutrophils: 42 %
Platelets: 395 10*3/uL (ref 150–450)
RBC: 4.01 x10E6/uL (ref 3.77–5.28)
RDW: 13 % (ref 11.7–15.4)
WBC: 3.9 10*3/uL (ref 3.4–10.8)

## 2021-12-01 LAB — LIPID PANEL
Chol/HDL Ratio: 2.2 ratio (ref 0.0–4.4)
Cholesterol, Total: 157 mg/dL (ref 100–199)
HDL: 70 mg/dL (ref >39)
LDL Chol Calc (NIH): 72 mg/dL (ref 0–99)
Triglycerides: 82 mg/dL (ref 0–149)
VLDL Cholesterol Cal: 15 mg/dL (ref 5–40)

## 2021-12-01 LAB — COMPREHENSIVE METABOLIC PANEL
ALT: 8 IU/L (ref 0–32)
AST: 16 IU/L (ref 0–40)
Albumin/Globulin Ratio: 1.7 (ref 1.2–2.2)
Albumin: 4 g/dL (ref 3.8–4.8)
Alkaline Phosphatase: 125 IU/L — ABNORMAL HIGH (ref 44–121)
BUN/Creatinine Ratio: 12 (ref 12–28)
BUN: 17 mg/dL (ref 8–27)
Bilirubin Total: 0.3 mg/dL (ref 0.0–1.2)
CO2: 22 mmol/L (ref 20–29)
Calcium: 9.3 mg/dL (ref 8.7–10.3)
Chloride: 107 mmol/L — ABNORMAL HIGH (ref 96–106)
Creatinine, Ser: 1.38 mg/dL — ABNORMAL HIGH (ref 0.57–1.00)
Globulin, Total: 2.4 g/dL (ref 1.5–4.5)
Glucose: 79 mg/dL (ref 70–99)
Potassium: 5.1 mmol/L (ref 3.5–5.2)
Sodium: 142 mmol/L (ref 134–144)
Total Protein: 6.4 g/dL (ref 6.0–8.5)
eGFR: 42 mL/min/{1.73_m2} — ABNORMAL LOW (ref >59)

## 2021-12-01 LAB — MAGNESIUM: Magnesium: 2.1 mg/dL (ref 1.6–2.3)

## 2021-12-07 ENCOUNTER — Telehealth: Payer: Self-pay

## 2021-12-07 NOTE — Telephone Encounter (Signed)
-----   Message from Alric Ran, MD sent at 12/07/2021 11:27 AM EDT ----- Please call and advise patient that the recent labs were within normal limits except for mild decrease in kidney function.

## 2021-12-07 NOTE — Progress Notes (Signed)
Please call and advise patient that the recent labs were within normal limits except for mild decrease in kidney function.

## 2021-12-07 NOTE — Telephone Encounter (Signed)
I called the pt's daughter ( ok per dpr) and advised of lab results. She verbalized understanding and appreciation for the call.

## 2021-12-19 NOTE — Progress Notes (Signed)
Carelink Summary Report / Loop Recorder 

## 2021-12-26 ENCOUNTER — Other Ambulatory Visit: Payer: Self-pay | Admitting: Adult Health

## 2022-01-01 ENCOUNTER — Ambulatory Visit (INDEPENDENT_AMBULATORY_CARE_PROVIDER_SITE_OTHER): Payer: Medicare Other

## 2022-01-01 DIAGNOSIS — I639 Cerebral infarction, unspecified: Secondary | ICD-10-CM

## 2022-01-01 LAB — CUP PACEART REMOTE DEVICE CHECK
Date Time Interrogation Session: 20230729230720
Implantable Pulse Generator Implant Date: 20220407

## 2022-01-30 ENCOUNTER — Other Ambulatory Visit: Payer: Self-pay | Admitting: Adult Health

## 2022-02-01 NOTE — Progress Notes (Signed)
Carelink Summary Report / Loop Recorder 

## 2022-02-04 ENCOUNTER — Other Ambulatory Visit (HOSPITAL_COMMUNITY): Payer: Self-pay | Admitting: Cardiology

## 2022-02-06 ENCOUNTER — Ambulatory Visit (INDEPENDENT_AMBULATORY_CARE_PROVIDER_SITE_OTHER): Payer: Medicare Other

## 2022-02-06 DIAGNOSIS — I639 Cerebral infarction, unspecified: Secondary | ICD-10-CM

## 2022-02-06 LAB — CUP PACEART REMOTE DEVICE CHECK
Date Time Interrogation Session: 20230905113517
Implantable Pulse Generator Implant Date: 20220407

## 2022-02-06 NOTE — Telephone Encounter (Signed)
Prescription refill request for Eliquis received. Indication:Afib Last office visit:4/23 Scr:1.4 Age: 66 Weight:65.3 kg  Prescription refilled

## 2022-02-27 NOTE — Progress Notes (Signed)
Carelink Summary Report / Loop Recorder 

## 2022-03-07 LAB — CUP PACEART REMOTE DEVICE CHECK
Date Time Interrogation Session: 20231003231002
Implantable Pulse Generator Implant Date: 20220407

## 2022-03-12 ENCOUNTER — Ambulatory Visit (INDEPENDENT_AMBULATORY_CARE_PROVIDER_SITE_OTHER): Payer: Medicare Other

## 2022-03-12 ENCOUNTER — Other Ambulatory Visit: Payer: Self-pay | Admitting: Adult Health

## 2022-03-12 DIAGNOSIS — I639 Cerebral infarction, unspecified: Secondary | ICD-10-CM | POA: Diagnosis not present

## 2022-03-12 DIAGNOSIS — I69398 Other sequelae of cerebral infarction: Secondary | ICD-10-CM

## 2022-03-21 NOTE — Progress Notes (Signed)
Carelink Summary Report / Loop Recorder 

## 2022-03-28 ENCOUNTER — Ambulatory Visit (INDEPENDENT_AMBULATORY_CARE_PROVIDER_SITE_OTHER): Payer: Medicare Other | Admitting: Family Medicine

## 2022-03-28 ENCOUNTER — Encounter: Payer: Self-pay | Admitting: Family Medicine

## 2022-03-28 VITALS — BP 110/70 | Temp 98.5°F | Ht 62.0 in | Wt 160.4 lb

## 2022-03-28 DIAGNOSIS — R112 Nausea with vomiting, unspecified: Secondary | ICD-10-CM

## 2022-03-28 DIAGNOSIS — N1831 Chronic kidney disease, stage 3a: Secondary | ICD-10-CM | POA: Diagnosis not present

## 2022-03-28 DIAGNOSIS — Z23 Encounter for immunization: Secondary | ICD-10-CM

## 2022-03-28 DIAGNOSIS — Z903 Acquired absence of stomach [part of]: Secondary | ICD-10-CM | POA: Diagnosis not present

## 2022-03-28 DIAGNOSIS — Z780 Asymptomatic menopausal state: Secondary | ICD-10-CM

## 2022-03-28 DIAGNOSIS — R635 Abnormal weight gain: Secondary | ICD-10-CM

## 2022-03-28 DIAGNOSIS — I69320 Aphasia following cerebral infarction: Secondary | ICD-10-CM

## 2022-03-28 DIAGNOSIS — M65332 Trigger finger, left middle finger: Secondary | ICD-10-CM

## 2022-03-28 DIAGNOSIS — I480 Paroxysmal atrial fibrillation: Secondary | ICD-10-CM

## 2022-03-28 DIAGNOSIS — E559 Vitamin D deficiency, unspecified: Secondary | ICD-10-CM

## 2022-03-28 DIAGNOSIS — G40909 Epilepsy, unspecified, not intractable, without status epilepticus: Secondary | ICD-10-CM

## 2022-03-28 DIAGNOSIS — Z8673 Personal history of transient ischemic attack (TIA), and cerebral infarction without residual deficits: Secondary | ICD-10-CM | POA: Diagnosis not present

## 2022-03-28 LAB — BASIC METABOLIC PANEL
BUN: 20 mg/dL (ref 6–23)
CO2: 23 mEq/L (ref 19–32)
Calcium: 9 mg/dL (ref 8.4–10.5)
Chloride: 111 mEq/L (ref 96–112)
Creatinine, Ser: 1.53 mg/dL — ABNORMAL HIGH (ref 0.40–1.20)
GFR: 34.96 mL/min — ABNORMAL LOW (ref >60.00)
Glucose, Bld: 50 mg/dL — ABNORMAL LOW (ref 70–99)
Potassium: 4.1 mEq/L (ref 3.5–5.1)
Sodium: 139 mEq/L (ref 135–145)

## 2022-03-28 LAB — VITAMIN D 25 HYDROXY (VIT D DEFICIENCY, FRACTURES): VITD: 23.57 ng/mL — ABNORMAL LOW (ref 30.00–100.00)

## 2022-03-28 LAB — VITAMIN B12: Vitamin B-12: 464 pg/mL (ref 211–911)

## 2022-03-28 MED ORDER — VITAMIN D (ERGOCALCIFEROL) 1.25 MG (50000 UNIT) PO CAPS: 50000.0000 [IU] | ORAL_CAPSULE | ORAL | 0 refills | Status: DC

## 2022-03-28 NOTE — Progress Notes (Signed)
Patient presents to clinic today to establish care and follow-up on ongoing concerns.  Patient is accompanied by her daughter.  SUBJECTIVE: PMH: Patient is a 66 year old female with pmh sig for h/o CVA, seizures, aphasia, history of gastric bypass who was previously seen by Roderic Ovens, PA-C at [facility].  34 of history given by patient's daughter 2/2 patient's history of aphasia.  CVA: -Initial stroke November 2022 -Per patient's daughter, pt had her 3rd CVA in Dec 2022, but they were not aware of the 2nd one. -taking lipitor 80 mg daily, sertraline 50 mg daily -followed by Neurology -has aphasia and seizures -recent insomnia.  Up around 2 or 3 am.  [name] now sleeping downstairs on couch to monitor pt in case she wakes up.  Hydroxyzine 10 mg as needed listed on med list, however pt's daughter unsure what it is for.  Seizures: -Occurred status post CVA  -On Keppra 1500 mg twice daily -Having mini seizures--staring spells in the last week or so.  Of note 1 occurred in clinic this visit. -Followed by neurology, Frann Rider.  Has appointment 11/2.  Aphasia: -Residual side effects from CVA -Patient able to understand what is being said to her but has difficulty fully communicating.  Able to understand some of what pt says.  Weight gain: -Patient is upset scale says 160 pounds this visit. -Was in the 140s but would like to get down to the 120s like when she was in high school. -Patient states she does not like her skin being loose on arms and breast.  Patient jokingly mentions had surgery for device in chest, but they did nothing for her breast.  A-fib: -Per patient's daughter question of if A-fib cause stroke -Had pacemaker placed in left upper chest. -Patient on Eliquis 5 mg twice daily -Followed by cardiology  Finger pain: -Ongoing concern -Patient mentions pain/discomfort in finger of left hand at times having to use other hand to  straighten finger.  History of bariatric surgery: -Gastric sleeve November 2021 -Taking over-the-counter daily multivitamin -Does not recall having bariatric vitamins  History of severe nausea/vomiting: -Much improved -Episode occurred several months ago requiring ED and subsequent hospitalization x1 week.  No cause of symptoms found. -Was prescribed metoclopramide 5 mg twice daily which helped symptoms. -Patient's daughter wean patient off metoclopramide as was unsure if patient was to stay on meds since it did not have refills -No GI follow-up noted after hospitalization -Patient concerned it may be due to all the medication she is taking.  Typically takes morning pills all together at once.  History of DM: -Normal A1c status post gastric bypass  Dizziness: -Intermittent -Patient endorses mild episode while in clinic -Staying hydrated throughout the day and eating regular meals.  Allergies: Homatroprine-itching Iodinated contrast media-itching Doxycycline-nausea and vomiting Sulfa antibiotics-rash, hives Codeine-hives, edema of tongue Hydrocodone-itching ACE inhibitors-cough, itching, rash  Social history: Pt is divorced.  She attended [employer] where she obtained a degree in nursing.  Patient has 1 daughter who moved back in with pt after CVA.  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  Patient denies alcohol, tobacco, drug use.  Health Maintenance: Dental -- Vision -- Immunizations -- Colonoscopy --every BTA normal 07/02/2020 Mammogram --February 2023 PAP --  Bone Density --   Family medical history: Mom-deceased, DM, heart disease, kidney disease Dad-deceased, alcohol abuse Sister-Faye, deceased Brother-Jeff, alive   Past Medical History:  Diagnosis Date   Acute  ischemic left MCA stroke ([place]) 06/30/2020   Acute ischemic stroke (HCC)    Acute stroke due to occlusion of left middle cerebral artery ([place]) 06/30/2020    Age-related nuclear cataract of both eyes 09/28/2015   Arthralgia of multiple joints 09/28/2015   Asthma    Borderline diabetes    Cerebrovascular accident (CVA) ([place])    Chronic bilateral thoracic back pain 06/05/2018   Chronic renal insufficiency, stage 1 08/11/2014   Diabetes mellitus type 2 in obese ([place])    Dyslipidemia    Dysphagia, post-stroke    Erosive gastritis 07/20/2019   Formatting of this note might be different from the original. On EGD 07/2019   Esophagitis 07/20/2019   Formatting of this note might be different from the original. Added automatically from request for surgery [id number]  Formatting of this note might be different from the original. On EGD 07/2019   Essential hypertension 09/28/2015   Essential thrombocytosis ([place]) 09/28/2015   Gallstones 08/18/2019   Formatting of this note might be different from the original. 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   Morbid obesity ([place]) 05/21/2013   Seizures ([place])    Stage 3b chronic kidney disease ([place])    Status post gastric bypass for obesity 09/28/2015    Past Surgical History:  Procedure Laterality Date   ANKLE SURGERY     CARPAL TUNNEL RELEASE     carpel tunnel     ESOPHAGOGASTRODUODENOSCOPY (EGD) WITH PROPOFOL N/A 11/14/2021   Procedure: ESOPHAGOGASTRODUODENOSCOPY (EGD) WITH PROPOFOL;  Surgeon: Gatha Mayer, MD;  Location: [facility];  Service: Gastroenterology;  Laterality: N/A;   IR ANGIO INTRA EXTRACRAN SEL COM CAROTID INNOMINATE BILAT MOD SED  01/17/2021   IR ANGIO VERTEBRAL SEL SUBCLAVIAN INNOMINATE UNI R MOD SED  01/17/2021   IR CT HEAD LTD  06/30/2020   IR INTRA CRAN STENT  06/30/2020   IR  PERCUTANEOUS ART THROMBECTOMY/INFUSION INTRACRANIAL INC DIAG ANGIO  06/30/2020   IR RADIOLOGIST EVAL & MGMT  08/26/2020   IR US GUIDE VASC ACCESS RIGHT  01/17/2021   RADIOLOGY WITH ANESTHESIA N/A 06/30/2020   Procedure: RADIOLOGY WITH ANESTHESIA;  Surgeon: Radiologist, Medication, MD;  Location: [facility];  Service: Radiology;  Laterality: N/A;    Current Outpatient Medications on File Prior to Visit  Medication Sig Dispense Refill   acetaminophen (TYLENOL) 500 MG tablet Take 1,000 mg by mouth every 6 (six) hours as needed for moderate pain or headache.     amitriptyline (ELAVIL) 50 MG tablet TAKE 1 TABLET BY MOUTH EVERYDAY AT BEDTIME 90 tablet 2   aspirin EC 81 MG tablet Take 1 tablet (81 mg total) by mouth daily. Swallow whole.     atorvastatin (LIPITOR) 80 MG tablet Take 1 tablet (80 mg total) by mouth daily. 90 tablet 3   ELIQUIS 5 MG TABS tablet TAKE 1 TABLET BY MOUTH TWICE A DAY 60 tablet 5   KLOR-CON M20 20 MEQ tablet Take 40 mEq by mouth 2 (two) times daily.     levETIRAcetam (KEPPRA) 750 MG tablet Take 2 tablets (1,500 mg total) by mouth 2 (two) times daily. 120 tablet 6   Multiple Vitamin (MULTIVITAMIN WITH MINERALS) TABS tablet Take 1 tablet by mouth daily.  sertraline (ZOLOFT) 50 MG tablet Take 50 mg by mouth daily.     ursodiol (ACTIGALL) 250 MG tablet Take 250 mg by mouth 2 (two) times daily.     metoCLOPramide (REGLAN) 5 MG tablet Take 1 tablet (5 mg total) by mouth every 8 (eight) hours as needed for up to 10 days for nausea or vomiting. 30 tablet 0   pantoprazole (PROTONIX) 40 MG tablet Take 1 tablet (40 mg total) by mouth daily. 30 tablet 1   No current facility-administered medications on file prior to visit.    Allergies  Allergen Reactions   Homatropine Itching   Iodinated Contrast Media Itching   Doxycycline Nausea And Vomiting   Sulfa Antibiotics Rash and Hives   Codeine Hives and Swelling    Swollen tongue   Hydrocodone Itching   Hydrocodone Bit-Homatrop Mbr  Itching   Ace Inhibitors Cough, Itching and Rash    Family History  Problem Relation Age of Onset   Stroke Maternal Grandfather     Social History   Socioeconomic History   Marital status: Divorced    Spouse name: Not on file   Number of children: Not on file   Years of education: Not on file   Highest education level: Not on file  Occupational History   Not on file  Tobacco Use   Smoking status: Never   Smokeless tobacco: Never  Vaping Use   Vaping Use: Never used  Substance and Sexual Activity   Alcohol use: No   Drug use: No   Sexual activity: Not on file  Other Topics Concern   Not on file  Social History Narrative   Lives with daughter [name]   Right [place] no caffeine   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[employer] [place]     Social Determinants of Health   Financial Resource Strain: Low Risk  (07/18/2021)   Overall Financial Resource Strain (CARDIA)    Difficulty of Paying Living Expenses: Not hard at all  Food Insecurity: No Food Insecurity (07/18/2021)   Hunger Vital Sign    Worried About Running Out of Food in the Last Year: Never true    Ran Out of Food in the Last Year: Never true  Transportation Needs: No Transportation Needs (07/18/2021)   PRAPARE - Hydrologist (Medical): No    Lack of Transportation (Non-Medical): No  Physical Activity: Not on file  Stress: No Stress Concern Present (05/24/2021)   [place]    Feeling of Stress : Only a little  Social Connections: Moderately Integrated (05/24/2021)   Social Connection and Isolation Panel [NHANES]    Frequency of Communication with Friends and Family: More than three times a week    Frequency of Social Gatherings with Friends and Family: More than three times a week    Attends Religious Services: 1 to 4 times per year    Active Member of [employer] or Organizations:  Yes    Attends Archivist Meetings: 1 to 4 times per year    Marital Status: Divorced  Human resources officer Violence: Not At Risk (06/30/2021)   Humiliation, Afraid, Rape, and Kick questionnaire    Fear of Current or Ex-Partner: No    Emotionally Abused: No    Physically Abused: No    Sexually Abused: No    ROS General: Denies fever, chills, night sweats,changes in appetite  +  changes in weight, dizziness HEENT: Denies headaches, ear pain, changes in vision, rhinorrhea, sore throat CV: Denies CP, palpitations, SOB, orthopnea Pulm: Denies SOB, cough, wheezing GI: Denies abdominal pain, diarrhea, constipation + nausea/vomiting GU: Denies dysuria, hematuria, frequency, vaginal discharge Msk: Denies muscle cramps, joint pains  + left third digit pain with flexion Neuro: Denies weakness, numbness, tingling  + staring spell Skin: Denies rashes, bruising Psych: Denies depression, anxiety, hallucinations + insomnia   BP 110/70 (BP Location: Left Arm, Patient Position: Sitting, Cuff Size: Normal)   Temp 98.5 F (36.9 C) (Oral)   Ht '5\' 2"'$  (1.575 m)   Wt 160 lb 6.4 oz (72.8 kg)   LMP 09/28/2012   BMI 29.34 kg/m   Physical Exam Gen. Pleasant, well developed, well-nourished, in NAD, expressive aphasia though able to understand some of what patient says. HEENT - [place]/AT, PERRL, EOMI, conjunctive clear, no scleral icterus, no nasal drainage, pharynx without erythema or exudate.  TMs normal bilaterally. Lungs: no use of accessory muscles, CTAB, no wheezes, rales or rhonchi Cardiovascular: RRR, No r/g/m, no peripheral edema Abdomen: BS present, soft, nontender,nondistended Musculoskeletal: Left third digit triggering with palpable firm nodule at MCP joint.  No deformities, moves all four extremities, no cyanosis or clubbing, normal tone Neuro:  A&Ox3, CN II-XII intact, normal gait.  Episode of staring while in clinic lasting 5-10 seconds Skin:  Warm, dry, intact, no lesions   Recent  Results (from the past 2160 hour(s))  CUP PACEART REMOTE DEVICE CHECK     Status: None   Collection Time: 12/30/21 11:07 PM  Result Value Ref Range   Date Time Interrogation Session [id number]    Pulse Generator Manufacturer MERM    Pulse Gen Model LNQ22 LINQ II    Pulse Gen Serial Number [id number] [place] [facility] Name [facility] Pulse Generator Type ICM/ILR    Implantable Pulse Generator Implant Date [id number]   CUP PACEART REMOTE DEVICE CHECK     Status: None   Collection Time: 02/06/22 11:35 AM  Result Value Ref Range   Pulse Generator Manufacturer MERM    Date Time Interrogation Session [id number]    Pulse Gen Model LNQ22 LINQ II    Pulse Gen Serial Number [id number]    Clinic Name [facility] Pulse Generator Type ICM/ILR    Implantable Pulse Generator Implant Date [id number]   CUP PACEART REMOTE DEVICE CHECK     Status: None   Collection Time: 03/06/22 11:10 PM  Result Value Ref Range   Date Time Interrogation Session [id number]    Pulse Generator Manufacturer MERM    Pulse Gen Model LNQ22 LINQ II    Pulse Gen Serial Number [id number]    Clinic Name [facility]    Implantable Pulse Generator Type ICM/ILR    Implantable Pulse Generator Implant Date [id number]    Eval Rhythm SR at 90 bpm     Assessment/Plan: Trigger middle finger of left hand  -Discussed treatment options including steroid injection -We will place referral to hand surgery -Given handout - Plan: Ambulatory referral to Hand Surgery  Seizure disorder ([place]) -Status post CVA -continue Keppra 1500 mg twice daily -Absence seizure's noted in the last few weeks.  Patient had brief absence seizure while in clinic this visit -Encouraged to keep follow-up with neurology on 04/05/2022  History of CVA (cerebrovascular accident) -With residual aphasia and seizures -Continue BP control -Continue Lipitor 80 mg  History of sleeve gastrectomy -Continue MVI -Consider  bariatric  multivitamin  - Plan: Vitamin B12, Vitamin D, 25-hydroxy  Aphasia following cerebrovascular accident (CVA)  Nausea and vomiting, unspecified vomiting type -Improving -discussed possible causes including gastroparesis due to history of gastric sleeve, GERD, medications -Consider keeping food diary -For continued or worsening symptoms will have patient follow-up with GI  Stage 3a chronic kidney disease ([place])  - Plan: Basic metabolic panel  Asymptomatic postmenopausal state  - Plan: DG Bone Density  Need for influenza vaccination  - Plan: Flu Vaccine QUAD High Dose(Fluad)  Paroxysmal atrial fibrillation (HCC) -Stable, RRR on exam -Pacemaker in place -Continue Eliquis 5 mg twice daily -Continue follow-up with cardiology  Weight gain: -Body mass index is 29.34 kg/m. -Patient reassured -Discussed portion sizes and other lifestyle modifications -Consider chair exercises  F/u as needed in the next few months, sooner if needed  Update: Vitamin D low at 23.57.  Ergocalciferol 50,000 IU weekly sent to pharmacy for vitamin D deficiency.  Blood glucose at time of lab draw 50 which could explain pt feeling lightheaded while in clinic.  AKI noted as creatinine 1.53 with GFR 34.96.  Baseline appears 1.21-1.3.  Increase p.o. intake of water and fluids.  Recheck  renal function in a week.  Grier Mitts, MD

## 2022-03-28 NOTE — Patient Instructions (Addendum)
It was nice meeting you guys today.  A referral to the hand surgeon was placed for trigger finger of left middle finger.  A referral was also placed to have a bone density scan.  This is to screen for osteoporosis or osteopenia which is a weakening of bones that can occur in postmenopausal women.  If you have not already done so consider getting a shingles vaccine.  Shingles is caused by the virus that causes chickenpox.  If you have previously had chickenpox as a child on your adult life the virus stays in your body and can reinfect you when you are stressed or your immune system is down.  Typically when it reinfect you if picks a nerve root to follow and you develop a rash on one side of your body.  The rash from shingles can be painful causing numbness/burning sensation in that area.  Some people develop postherpetic neuralgia which is the continuation of that pain for up to a year after the rash has gone away.  I put in orders for labs if you want to get this done today you can while you are here in clinic.  We can check vitamin D and vitamin B12 levels along with your kidney function.  We will have you follow-up in the next few months for ongoing concerns.  I know the holidays can be a little hectic so you can schedule the appointment at your convenience.

## 2022-03-30 ENCOUNTER — Other Ambulatory Visit: Payer: Self-pay

## 2022-03-30 DIAGNOSIS — N1831 Chronic kidney disease, stage 3a: Secondary | ICD-10-CM

## 2022-03-30 NOTE — Addendum Note (Signed)
Addended by: Anderson Malta on: 03/30/2022 03:28 PM   Modules accepted: Orders

## 2022-04-05 ENCOUNTER — Ambulatory Visit (INDEPENDENT_AMBULATORY_CARE_PROVIDER_SITE_OTHER): Payer: Medicare Other | Admitting: Adult Health

## 2022-04-05 ENCOUNTER — Encounter: Payer: Self-pay | Admitting: Adult Health

## 2022-04-05 ENCOUNTER — Other Ambulatory Visit (INDEPENDENT_AMBULATORY_CARE_PROVIDER_SITE_OTHER): Payer: Medicare Other

## 2022-04-05 VITALS — BP 119/65 | HR 67 | Ht 62.5 in | Wt 164.1 lb

## 2022-04-05 DIAGNOSIS — R569 Unspecified convulsions: Secondary | ICD-10-CM

## 2022-04-05 DIAGNOSIS — N1831 Chronic kidney disease, stage 3a: Secondary | ICD-10-CM

## 2022-04-05 DIAGNOSIS — G441 Vascular headache, not elsewhere classified: Secondary | ICD-10-CM | POA: Diagnosis not present

## 2022-04-05 DIAGNOSIS — I69398 Other sequelae of cerebral infarction: Secondary | ICD-10-CM | POA: Diagnosis not present

## 2022-04-05 DIAGNOSIS — I63411 Cerebral infarction due to embolism of right middle cerebral artery: Secondary | ICD-10-CM

## 2022-04-05 DIAGNOSIS — G40919 Epilepsy, unspecified, intractable, without status epilepticus: Secondary | ICD-10-CM | POA: Diagnosis not present

## 2022-04-05 LAB — BASIC METABOLIC PANEL
BUN: 18 mg/dL (ref 6–23)
CO2: 22 mEq/L (ref 19–32)
Calcium: 9 mg/dL (ref 8.4–10.5)
Chloride: 108 mEq/L (ref 96–112)
Creatinine, Ser: 1.38 mg/dL — ABNORMAL HIGH (ref 0.40–1.20)
GFR: 39.56 mL/min — ABNORMAL LOW (ref >60.00)
Glucose, Bld: 166 mg/dL — ABNORMAL HIGH (ref 70–99)
Potassium: 4.1 mEq/L (ref 3.5–5.1)
Sodium: 138 mEq/L (ref 135–145)

## 2022-04-05 MED ORDER — AMITRIPTYLINE HCL 50 MG PO TABS: 50.0000 mg | ORAL_TABLET | Freq: Every day | ORAL | 5 refills | Status: DC

## 2022-04-05 MED ORDER — AMITRIPTYLINE HCL 75 MG PO TABS: 75.0000 mg | ORAL_TABLET | Freq: Every day | ORAL | 5 refills | Status: DC

## 2022-04-05 MED ORDER — TOPIRAMATE 25 MG PO TABS: 25.0000 mg | ORAL_TABLET | Freq: Two times a day (BID) | ORAL | 3 refills | Status: DC

## 2022-04-05 MED ORDER — LEVETIRACETAM 750 MG PO TABS: 1500.0000 mg | ORAL_TABLET | Freq: Two times a day (BID) | ORAL | 6 refills | Status: DC

## 2022-04-05 NOTE — Patient Instructions (Addendum)
Your Plan:  Continue amitriptyline '50mg'$  nightly for headache prevention  Start topamax '25mg'$  twice daily for 7 days then increase to '50mg'$  twice daily  Please call with any additional seizure activity or if headaches continue   Continue keppra '1500mg'$  twice daily for seizure prevention  Try melatonin '5mg'$  nightly to help you sleep    Follow up in 4 months or call earlier if needed     Thank you for coming to see Courtney Burke at [facility]. I hope we have been able to provide you high quality care today.  You may receive a patient satisfaction survey over the next few weeks. We would appreciate your feedback and comments so that we may continue to improve ourselves and the health of our patients.    Topiramate Tablets What is this medication? TOPIRAMATE (toe PYRE a mate) prevents and controls seizures in people with epilepsy. It may also be used to prevent migraine headaches. It works by calming overactive nerves in your body. This medicine may be used for other purposes; ask your health care provider or pharmacist if you have questions. COMMON BRAND NAME(S): Topamax, Topiragen What should I tell my care team before I take this medication? They need to know if you have any of these conditions: Bleeding disorder Kidney disease Lung disease Suicidal thoughts, plans, or attempt by you or a family member An unusual or allergic reaction to topiramate, other medications, foods, dyes, or preservatives Pregnant or trying to get pregnant Breast-feeding How should I use this medication? Take this medication by mouth with water. Take it as directed on the prescription label at the same time every day. Do not cut, crush or chew this medicine. Swallow the tablets whole. You can take it with or without food. If it upsets your stomach, take it with food. Keep taking it unless your care team tells you to stop. A special MedGuide will be given to you by the pharmacist with each  prescription and refill. Be sure to read this information carefully each time. Talk to your care team about the use of this medication in children. While it may be prescribed for children as young as 2 years for selected conditions, precautions do apply. Overdosage: If you think you have taken too much of this medicine contact a poison control center or emergency room at once. NOTE: This medicine is only for you. Do not share this medicine with others. What if I miss a dose? If you miss a dose, take it as soon as you can unless it is within 6 hours of the next dose. If it is within 6 hours of the next dose, skip the missed dose. Take the next dose at the normal time. Do not take double or extra doses. What may interact with this medication? Acetazolamide Alcohol Antihistamines for allergy, cough, and cold Aspirin and aspirin-like medications Atropine Certain medications for anxiety or sleep Certain medications for bladder problems, such as oxybutynin, tolterodine Certain medications for depression, such as amitriptyline, fluoxetine, sertraline Certain medications for Parkinson disease, such as benztropine, trihexyphenidyl Certain medications for seizures, such as carbamazepine, lamotrigine, phenobarbital, phenytoin, primidone, valproic acid, zonisamide Certain medications for stomach problems, such as dicyclomine, hyoscyamine Certain medications for travel sickness, such as scopolamine Certain medications that treat or prevent blood clots, such as warfarin, enoxaparin, dalteparin, apixaban, dabigatran, rivaroxaban Digoxin Diltiazem Estrogen and progestin hormones General anesthetics, such as halothane, isoflurane, methoxyflurane, propofol Glyburide Hydrochlorothiazide Ipratropium Lithium Medications that relax muscles Metformin NSAIDs, medications for pain and inflammation,  such as ibuprofen or naproxen Opioid medications for pain Phenothiazines, such as chlorpromazine, mesoridazine,  prochlorperazine, thioridazine Pioglitazone This list may not describe all possible interactions. Give your health care provider a list of all the medicines, herbs, non-prescription drugs, or dietary supplements you use. Also tell them if you smoke, drink alcohol, or use illegal drugs. Some items may interact with your medicine. What should I watch for while using this medication? Visit your care team for regular checks on your progress. Tell your care team if your symptoms do not start to get better or if they get worse. Do not suddenly stop taking this medication. You may develop a severe reaction. Your care team will tell you how much medication to take. If your care team wants you to stop the medication, the dose may be slowly lowered over time to avoid any side effects. Wear a medical ID bracelet or chain. Carry a card that describes your condition. List the medications and doses you take on the card. This medication may affect your coordination, reaction time, or judgment. Do not drive or operate machinery until you know how this medication affects you. Sit up or stand slowly to reduce the risk of dizzy or fainting spells. Drinking alcohol with this medication can increase the risk of these side effects. This medication may cause serious skin reactions. They can happen weeks to months after starting the medication. Contact your care team right away if you notice fevers or flu-like symptoms with a rash. The rash may be red or purple and then turn into blisters or peeling of the skin. You may also notice a red rash with swelling of the face, lips, or lymph nodes in your neck or under your arms. This medication may cause thoughts of suicide or depression. This includes sudden changes in mood, behaviors, or thoughts. These changes can happen at any time but are more common in the beginning of treatment or after a change in dose. Call your care team right away if you experience these thoughts or worsening  depression. This medication may slow your child's growth if it is taken for a long time at high doses. Your child's care team will monitor your child's growth. Using this medication for a long time may weaken your bones. The risk of bone fractures may be increased. Talk to your care team about your bone health. Discuss this medication with your care team if you may be pregnant. Serious birth defects can occur if you take this medication during pregnancy. There are benefits and risks to taking medications during pregnancy. Your care team can help you find the option that works for you. Contraception is recommended while taking this medication. Estrogen and progestin hormones may not work as well while you are taking this medication. Your care team can help you find the option that works for you. Talk to your care team before breastfeeding. Changes to your treatment plan may be needed. What side effects may I notice from receiving this medication? Side effects that you should report to your care team as soon as possible: Allergic reactions--skin rash, itching, hives, swelling of the face, lips, tongue, or throat High acid level--trouble breathing, unusual weakness or fatigue, confusion, headache, fast or irregular heartbeat, nausea, vomiting High ammonia level--unusual weakness or fatigue, confusion, loss of appetite, nausea, vomiting, seizures Fever that does not go away, decrease in sweat Kidney stones--blood in the urine, pain or trouble passing urine, pain in the lower back or sides Redness, blistering, peeling or loosening  of the skin, including inside the mouth Sudden eye pain or change in vision such as blurry vision, seeing halos around lights, vision loss Thoughts of suicide or self-harm, worsening mood, feelings of depression Side effects that usually do not require medical attention (report to your care team if they continue or are bothersome): Burning or tingling sensation in hands or  feet Difficulty with paying attention, memory, or speech Dizziness Drowsiness Fatigue Loss of appetite with weight loss Slow or sluggish movements of the body This list may not describe all possible side effects. Call your doctor for medical advice about side effects. You may report side effects to FDA at 1-800-FDA-1088. Where should I keep my medication? Keep out of the reach of children and pets. Store between 15 and 30 degrees C (59 and 86 degrees F). Protect from moisture. Keep the container tightly closed. Get rid of any unused medication after the expiration date. To get rid of medications that are no longer needed or have expired: Take the medication to a medication take-back program. Check with your pharmacy or law enforcement to find a location. If you cannot return the medication, check the label or package insert to see if the medication should be thrown out in the garbage or flushed down the toilet. If you are not sure, ask your care team. If it is safe to put it in the trash, empty the medication out of the container. Mix the medication with cat litter, dirt, coffee grounds, or other unwanted substance. Seal the mixture in a bag or container. Put it in the trash. NOTE: This sheet is a summary. It may not cover all possible information. If you have questions about this medicine, talk to your doctor, pharmacist, or health care provider.  2023 [employer] Standard (2020-08-15 00:00:00)

## 2022-04-05 NOTE — Progress Notes (Signed)
[facility] [address] [place]. [place] [address] [phone]       OFFICE FOLLOW-UP NOTE  [name] Date of Birth:  04-14-1956 Medical Record Number:  [id number]    Reason for visit: Stroke, seizure, headaches Primary neurologist: Dr. Leonie Man  Chief Complaint  Patient presents with   follow up stroke    Pt reports having a bad headache. She reports feeling okay today but just tired. Daughter has questions about headaches and her not sleeping. Pt is worried about her weight. Daughter notices she has been stumbling and tripping a lot lately. Room 2 with daughter     HPI:   Update 04/05/2022 JM: Patient returns for 30-monthfollow-up accompanied by her daughter  Seizure: Currently on Keppra 1500 mg twice daily Daughter reports seizure last week, was sitting on the side of her bed folding laundry talking with her daughter and then suddenly stopped talking and leaned back into bed frame, eyes open, was not responding to daughter, after a couple of minutes daughter splashed water on her face and patient came to.  She was drowsy for short while after.  No additional events since that time.  Denies any missed dosages of Keppra, recent illness or infection or other specific provoking factors.   Headaches: Currently on amitriptyline 50 mg nightly Reports continued headaches, some improvement initially after increasing amitriptyline dosage at last visit but more lately has been having more headaches.  Reports these occur almost daily. Will use Tylenol couple times weekly for more severe headaches. Daughter mentions she has not been sleeping well recently. Currently working with PCP regarding this.  She also complains of daytime fatigue, recently started on vitamin D supplement for vitamin D deficiency.    Stroke: Stable. No new stroke/[name] symptoms. Remains on Eliquis, aspirin and atorvastatin Blood pressure well controlled      History provided for  reference purposes only Update 11/30/2021 JM: Patient returns for follow-up visit accompanied by her daughter.  Previously seen 3 months ago with complaints of worsening headache as well as breakthrough seizure.   Keppra dosage increased to 1500 mg twice daily, tolerating without side effects, denies any recurrent seizure activity.    Reports continued daily frontal headaches 2-3x per day, usually short lasting, takes tylenol '1000mg'$  of tyneol 2-3x daily which resolves headaches.  Remains on amitriptyline 25 mg nightly, denies side effects.  Stable from stroke standpoint without new stroke/[name] symptoms.  Residual speech deficit stable.  Compliant on Eliquis, and aspirin, denies side effects. Has had difficulty obtaining amlodipine and atorvastatin refill, has been out over the past 3 months, reports PCP will not refill as she did not start medications.  blood pressure today 123/77. Monitors at home and has been stable.   Was hospitalized 6/7 through 6/14 for 1 week nausea/vomiting and intermittent abdominal pain.  No clear source identified.  Did note dehydration with elevated creatinine and anemia, hypokalemia, hypomagnesemia and hyponatremia. She has not had any additional n/v or abdominal pains.  No further concerns at this time   Update 08/28/2021 JM: Returns for acute visit due to complaints of worsening headaches.  At prior visit 2 months ago, she was started on amitriptyline for headache complaints since her stroke in 05/2021. Has been taking amitriptyline '25mg'$  nightly - per daughter, thought they were improving as no mention of having headaches since starting amitriptyline but at cardiology visit last week, was complaining of headache and not feeling right upon arriving to appt. She was able to ambulate  back but walking slow, sat down and then stopped talking. Mouth was twisted towards the side, teeth clenched, and lost consciousness for 2-3 minutes, she did have postictal confusion but improved  after a few minutes. She was not sent to ED as she recovered back to baseline within reasonable time. No additional seizures since that time. She has been complaining of a headache since her seizure but no additional headaches over the past 2 days. Denies any new/worsening speech/language difficulties, weakness, vision changes or mental status changes. Daughter does report starting on sertraline '50mg'$  daily by psychiatry 2 weeks ago for depression, no other medication changes. No recent illness, trauma, falls, or other potential provoking factors. She does not drink alcohol or use recreational drugs.  Reports compliance with all medications.  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 fib after presenting with headache, worsening speech and confusion. MRA right ICA 50% stenosis and severe proximal right M2 stenosis, L M1 stent patent, no LVO.  On Eliquis twice daily and Brilinta PTA recommended continuation as well as adding aspirin 81 mg daily.  LDL 21 on atorvastatin.  A1c 5.5.  EEG generalized slowing but no seizures - remained on keppra. Discharged 12/7. Returned on 12/12 after episode of vomiting and syncopal episode with MRI showing extension of recent R MCA stroke likely in setting of hypotension/syncope in setting of right ICA and M2 stenosis. Continued Eliquis and asa -Brilinta discontinued.  Concern of possible seizure on 12/14 with staring off and not responsive, confusion for a few minutes after therefore increased Keppra to 1g BID. LDL 21 - decreased atorvastatin to 40 mg daily. BP stabilized on IVF, orthostatics unremarkable.  Residual expressive aphasia with word salad. Therapy evals recommended OP therapies.    Today she is accompanied by her daughter, Courtney Burke. Having headaches since recent stroke. Will take tylenol with some benefit but will return. Continues to work with SLP  noting improvement, family working with her on reading out loud and conversations. Per daughter, patient will push herself too much at times and try to do too much at once. Family tries to ensure she takes breaks. Denies weakness, some imbalance but overall stable. Does c/o fear of additional strokes and difficulty sleeping at night due to mind wandering. No prior issues with depression/anxiety.   Reports compliance on Eliquis 5 mg twice daily and atorvastatin 40 mg daily without side effects. Has not been taking aspirin.  Blood pressure today 151/78 and on recheck, 132/82.  Compliant on Keppra but over past week has been taking '500mg'$  BID due to difficulty refilling new dose (had '500mg'$  tabs left over from prior prescription). Was doing well on '1000mg'$  BID.   Of note, diagnosed with COVID-19 12/29 after presenting to ED with lightheadedness, dizziness, nausea and vomiting and possible loss of consciousness or near syncope.  Extensive work-up largely unrevealing except COVID-positive which was felt to be contributing to her symptoms. MR brain and MRA head/neck no acute findings or changes from prior recent imaging.   No further concerns at this time     PERTINENT IMAGING  Per hospitalization 05/15/2021 CT head - Slightly increased hypodensity in the right posterior temporal lobe and lateral occipital lobe, consistent with evolution of previously noted acute infarct. MRI  Extension of the acute infarction in the right MCA territory at the right temporoparietal junction, particularly along the inferior margin of the infarction and with a  few tiny punctate satellite foci additionally present. MRA 12/7 - Approximate 50% stenosis at the supraclinoid right ICA, with probable additional short-segment severe proximal right M2 stenosis.  EEG cortical dysfunction arising from left and right temporal region, no seizure   Per hospitalization 05/10/2021 CT head Old left MCA territory strokes. Newly seen loss of  gray-white differentiation at the right temporoparietal junction suggesting acute infarction in that region today. No hemorrhage or mass effect MRI  1. Acute ischemic nonhemorrhagic infarct involving the right temporoccipital junction, posterior right MCA distribution.2. Chronic left MCA territory infarcts, stable from prior.3. Additional small remote right cerebellar infarct.4. Underlying age-related cerebral atrophy with mild chronic small vessel ischemic disease. MRA  head1. Negative intracranial MRA for large vessel occlusion.2. Approximate 50% stenosis at the supraclinoid right ICA, with probable additional short-segment severe proximal right M2 stenosis.3. Vascular stent in place within the left M1 segment. Flow through the stent itself not evaluated by MRA, although patent flow seen distal to the stent.4. Short-segment severe left M2 stenosis, similar to previous.5. Wide patency of the posterior circulation MRA Neck Wide patency of both carotid artery systems and vertebral arteries within the neck. No hemodynamically significant stenosis or other acute vascular abnormality. 2D Echo EF 55 to 60% EEG generalized slowing.  No seizures identified      Update 02/13/2021 JM: Courtney Burke returns for acute visit in setting of recent seizure activity.  She is accompanied by her brother.  Seizure type event 02/07/2021 evaluated in ED with symptoms of headache and then tonic-clonic jerking lasting 5 to 6 minutes with residual confusion and bowel incontinence.  Initiated Keppra 500 mg twice daily. CT head unremarkable.  No additional events since discharge.  Remains on Keppra 500 mg twice daily tolerating without side effects.  Reports slight worsening of speech since seizure event.  Was working with SLP and PT prior to seizure but has not yet restarted.  Continues to use a cane and denies any recent falls.  Denies new stroke/[name] symptoms.  Remains on Eliquis and atorvastatin.  Also compliant on Brilinta per IR  recommendations.  No further concerns at this time.  Update 01/04/2021 JM: Courtney Burke returns for 33-monthstroke follow-up accompanied by her brother, JMerry Proud  Working with SLP for residual aphasia but notes ongoing improvement. Gait has also been improving working with PT - uses cane - did have a fall last month but thankfully without injury.  Denies new stroke/[name] symptoms. ILR showed evidence of atrial fibrillation on 10/05/2020 -Eliquis initiated in addition to BCoy ASA d/c'd.  She does have mild bruising but otherwise tolerating.  Has not yet scheduled diagnostic angiogram with Dr. DEstanislado Pandy  Compliant on atorvastatin without associated side effects.  Blood pressure 136/82. Monitors at home and typically stable 120s/70s.  No further concerns at this time.  Initial visit 10/05/2020 Dr. SLeonie Man Ms. DMowbrayis a 66year old African-American lady seen today for initial office follow-up visit following hospital admission for stroke in January 2022.  She is accompanied by her daughter.  History is obtained from them and review of hospital electronic medical records and I personally reviewed imaging films in PACS.  She is a 66year old pleasant African-American lady with past medical history of diabetes, hypertension, chronic kidney disease stage IIIa, dysphagia, obesity, s/p gastric sleeve surgery in December 2021.  She was brought in as a code stroke by EMS after she developed altered mental status.  She had a witnessed episodes in which her eyes rolled back in her head twice with brief  loss of consciousness.  She is subsequently found to have right-sided weakness.  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  Aspect score was 6 on admission.  Infarct core was 11  to 20 cc only.  After discussion of risk benefits with the patient's family patient was taken for emergent mechanical thrombectomy which was performed by Dr. Estanislado Pandy but there was underlying MCA stenosis requiring rescue MCA stenting with resultant TICI 2C revascularization.  She was admitted admitted to the intensive care unit blood pressure tightly controlled.  She was subsequently extubated and found to have global aphasia and right hemiparesis.  MRI scan showed acute infarct involving left MCA territory of basal ganglia as well as left patchy temporal frontal and parietal lobes.  There is small area of hemorrhage noted in the left posterior temporal lobe.  2D echo showed ejection fraction of 6065%.  Lower extremity venous Dopplers negative for DVT.  LDL cholesterol 68 mg percent.  Hemoglobin A1c was 5.4.  Patient was started on aspirin and Brilinta for MCA stent.  History of dysphagia and had a feeding tube inserted.  She was transferred to inpatient rehab where she stayed for few weeks.  She also started on Keppra for seizure prophylaxis due to her episodes of seizure-like presentation.  EEG she had shown intermittent rhythmic delta slowing in the left frontal region.  Patient has been at home since rehab.  She is to get getting physical occupational and speech therapy which is now down to 1 day a week.  She is able to speak better but still has to speak slowly and occasionally gets stuck and then some word finding difficulties.  She has noticed improvement in right-sided strength and now she can walk independently but needs a walker.  She still has some dragging of the right foot.  Her balance is poor.  However she has had no falls or injuries.  She is able to manage most activities of daily living but needs a little supervision.  She is tolerating aspirin and Brilinta well with only minor bruising and no bleeding.  Blood pressures well controlled today it is elevated in the office at 153/75.  She remains on  Lipitor which is tolerating well without muscle aches and pains.    ROS:   14 system review of systems is positive for those listed in HPI and all other systems negative  PMH:  Past Medical History:  Diagnosis Date   Acute ischemic left MCA stroke ([place]) 06/30/2020   Acute ischemic stroke (HCC)    Acute stroke due to occlusion of left middle cerebral artery ([place]) 06/30/2020   Age-related nuclear cataract of both eyes 09/28/2015   Arthralgia of multiple joints 09/28/2015   Asthma    Borderline diabetes    Cerebrovascular accident (CVA) ([place])    Chronic bilateral thoracic back pain 06/05/2018   Chronic renal insufficiency, stage 1 08/11/2014   Diabetes mellitus type 2 in obese ([place])    Dyslipidemia    Dysphagia, post-stroke    Erosive gastritis 07/20/2019   Formatting of this note might be different from the original. On EGD 07/2019   Esophagitis 07/20/2019   Formatting of this note might be different from the original. Added automatically from request  for surgery [phone]  Formatting of this note might be different from the original. On EGD 07/2019   Essential hypertension 09/28/2015   Essential thrombocytosis ([place]) 09/28/2015   Gallstones 08/18/2019   Formatting of this note might be different from the original. Added automatically from request for surgery [id number]   Gastroesophageal reflux disease without esophagitis 09/28/2015   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   Hyperlipidemia, unspecified 03/16/2013   Hypertension    Keratoconjunctivitis sicca of both eyes not specified as Sjogren's 09/28/2015   Left middle cerebral artery stroke ([place]) 07/09/2020   Morbid obesity ([place]) 05/21/2013   Seizures ([place])    Stage 3b chronic kidney disease ([place])    Status post gastric bypass for obesity 09/28/2015    Social History:  Social History   Socioeconomic History   Marital status: Divorced    Spouse name: Not on file   Number of children: Not on file   Years of education: Not  on file   Highest education level: Not on file  Occupational History   Not on file  Tobacco Use   Smoking status: Never   Smokeless tobacco: Never  Vaping Use   Vaping Use: Never used  Substance and Sexual Activity   Alcohol use: No   Drug use: No   Sexual activity: Not on file  Other Topics Concern   Not on file  Social History Narrative   Lives with daughter Courtney Burke   Right Placer no caffeine   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[place] Strain: [place]  (07/18/2021)   Overall Financial Resource Strain (CARDIA)    Difficulty of Paying Living Expenses: Not hard at all  Food Insecurity: No Food Insecurity (07/18/2021)   Hunger Vital Sign    Worried About Running Out of Food in the Last Year: Never true    Ran Out of Food in the Last Year: Never true  Transportation Needs: No Transportation Needs (07/18/2021)   PRAPARE - Hydrologist (Medical): No    Lack of Transportation (Non-Medical): No  Physical Activity: Not on file  Stress: No Stress Concern Present (05/24/2021)   [place]    Feeling of Stress : Only a little  Social Connections: Moderately Integrated (05/24/2021)   Social Connection and Isolation Panel [NHANES]    Frequency of Communication with Friends and Family: More than three times a week    Frequency of Social Gatherings with Friends and Family: More than three times a week    Attends Religious Services: 1 to 4 times per year    Active Member of [employer] or Organizations: Yes    Attends Archivist Meetings: 1 to 4 times per year    Marital Status: Divorced  Human resources officer Violence: Not At Risk (06/30/2021)   Humiliation, Afraid, Rape, and Kick questionnaire    Fear of Current or Ex-Partner: No    Emotionally Abused: No    Physically Abused: No     Sexually Abused: No    Medications:   Current Outpatient Medications on File Prior to Visit  Medication Sig Dispense Refill   acetaminophen (TYLENOL) 500 MG tablet Take 1,000 mg by mouth every 6 (six) hours as needed for moderate pain or headache.     amitriptyline (ELAVIL) 50 MG tablet TAKE 1 TABLET BY MOUTH  EVERYDAY AT BEDTIME 90 tablet 2   aspirin EC 81 MG tablet Take 1 tablet (81 mg total) by mouth daily. Swallow whole.     atorvastatin (LIPITOR) 80 MG tablet Take 1 tablet (80 mg total) by mouth daily. 90 tablet 3   ELIQUIS 5 MG TABS tablet TAKE 1 TABLET BY MOUTH TWICE A DAY 60 tablet 5   KLOR-CON M20 20 MEQ tablet Take 40 mEq by mouth 2 (two) times daily.     levETIRAcetam (KEPPRA) 750 MG tablet Take 2 tablets (1,500 mg total) by mouth 2 (two) times daily. 120 tablet 6   Multiple Vitamin (MULTIVITAMIN WITH MINERALS) TABS tablet Take 1 tablet by mouth daily.     pantoprazole (PROTONIX) 40 MG tablet Take 40 mg by mouth daily.     sertraline (ZOLOFT) 50 MG tablet Take 50 mg by mouth daily.     ursodiol (ACTIGALL) 250 MG tablet Take 250 mg by mouth 2 (two) times daily.     Vitamin D, Ergocalciferol, (DRISDOL) 1.25 MG (50000 UNIT) CAPS capsule Take 1 capsule (50,000 Units total) by mouth every 7 (seven) days. 12 capsule 0   metoCLOPramide (REGLAN) 5 MG tablet Take 1 tablet (5 mg total) by mouth every 8 (eight) hours as needed for up to 10 days for nausea or vomiting. 30 tablet 0   No current facility-administered medications on file prior to visit.    Allergies:   Allergies  Allergen Reactions   Homatropine Itching   Iodinated Contrast Media Itching   Doxycycline Nausea And Vomiting   Sulfa Antibiotics Rash and Hives   Codeine Hives and Swelling    Swollen tongue   Hydrocodone Itching   Hydrocodone Bit-Homatrop Mbr Itching   Ace Inhibitors Cough, Itching and Rash    Physical Exam There were no vitals filed for this visit.   There is no height or weight on file to calculate  BMI.    General: well developed, well nourished very pleasant middle-aged African-American lady, seated, in no evident distress Head: head normocephalic and atraumatic.  Neck: supple with no carotid or supraclavicular bruits Cardiovascular: regular rate and rhythm, no murmurs Musculoskeletal: no deformity Skin:  no rash/petichiae Vascular:  Normal pulses all extremities  Neurologic Exam Mental Status: Awake and fully alert.  moderate expressive > mild receptive aphasia.  Able to follow simple step commands without difficulty.  Improvement of comprehension although some impairment.  Oriented to place and person.  Recent memory impaired and remote memory intact. Attention span, concentration and fund of knowledge appropriate during visit. Mood and affect appropriate.   Cranial Nerves: Pupils equal, briskly reactive to light. Extraocular movements full without nystagmus. Visual fields full to confrontation. Hearing intact. Facial sensation intact.  Face, tongue, palate moves normally and symmetrically.  Motor: Normal bulk and tone. Normal strength in all tested extremity muscles.  Sensory.: intact to touch ,pinprick .position and vibratory sensation.  Coordination: Rapid alternating movements normal in all extremities. Finger-to-nose and heel-to-shin performed accurately bilaterally. Gait and Station: Arises from chair without difficulty. Stance is normal. Gait demonstrates normal stride length with mild imbalance initially but stabilized without assistive device.   Reflexes: 1+ and symmetric. Toes downgoing.       ASSESSMENT/PLAN: 66 year old lady with left MCA infarct due to left MCA occlusion s/p rescue stenting 06/2020 s/p ILR and seizures placed on Keppra. ILR showed A fib 10/2020 - placed on Eliquis. Additional seizure 02/2021, restarted Keppra. R MCA temporal occipital junction infarct 05/2021 likely from A. fib with extension  05/15/2021 and seizure activity     1.  Seizure activity  likely late effect of stroke 2. Breakthrough seizure -recent seizure last week seemingly unprovoked -start topiramate with gradual titration to 50 mg twice daily (can also help with headaches), may possibly need to increase at follow up visit -Continue Keppra 1500 mg twice daily -Lab work recently completed by PCP -EEG 09/07/2021 normal -Discussed seizure provoking triggers and importance of avoidance -advised to call with any seizure activity    3. Vascular headaches -Start topiramate to gradual titration to 50 mg twice daily -Continue amitriptyline to 50 mg nightly  -advised to call with any worsening of headaches or proceed to ED/call 911 with any severe headache or associated with red flag signs/symptoms -Does complain of insomnia and daytime fatigue which could be contributing to continued daily headaches. May need to consider sleep evaluation if symptoms persist after trial of melatonin and initiating vitamin D supplement   4.  Right MCA stroke 5. Hx of L MCA stroke -Continue language deficits stable -continue Eliquis 5 mg twice daily, asa '81mg'$  daily and atorvastatin 80 mg daily for secondary stroke prevention measures  -s/p MCA stent followed by IR -maintain strict control of hypertension with blood pressure goal below 130/90 and lipids with LDL cholesterol goal below 70 mg/dL.       Follow-up in 4 months or call earlier if needed    CC:  Billie Ruddy, MD   I spent a prolonged 43 minutes of face-to-face and non-face-to-face time with patient and daughter.  This included previsit chart review, lab review, study review, order entry, electronic health record documentation, patient and daughter education and discussion regarding above diagnoses and answered all other questions to patient and daughters satisfaction   Frann Rider, [facility]  [facility] [address] [place], [place] [address]  Phone [phone] Fax [phone] Note:  This document was prepared with digital dictation and possible smart phrase technology. Any transcriptional errors that result from this process are unintentional.

## 2022-04-08 ENCOUNTER — Other Ambulatory Visit: Payer: Self-pay | Admitting: Family Medicine

## 2022-04-08 DIAGNOSIS — E559 Vitamin D deficiency, unspecified: Secondary | ICD-10-CM

## 2022-04-16 ENCOUNTER — Ambulatory Visit (INDEPENDENT_AMBULATORY_CARE_PROVIDER_SITE_OTHER): Payer: Medicare Other

## 2022-04-16 DIAGNOSIS — I639 Cerebral infarction, unspecified: Secondary | ICD-10-CM | POA: Diagnosis not present

## 2022-04-17 LAB — CUP PACEART REMOTE DEVICE CHECK
Date Time Interrogation Session: 20231112231053
Implantable Pulse Generator Implant Date: 20220407

## 2022-04-19 ENCOUNTER — Other Ambulatory Visit (HOSPITAL_COMMUNITY): Payer: Self-pay

## 2022-05-11 ENCOUNTER — Other Ambulatory Visit: Payer: Self-pay | Admitting: Family Medicine

## 2022-05-11 ENCOUNTER — Other Ambulatory Visit: Payer: Self-pay | Admitting: Adult Health

## 2022-05-11 DIAGNOSIS — I69398 Other sequelae of cerebral infarction: Secondary | ICD-10-CM

## 2022-05-11 DIAGNOSIS — E559 Vitamin D deficiency, unspecified: Secondary | ICD-10-CM

## 2022-05-20 ENCOUNTER — Other Ambulatory Visit: Payer: Self-pay | Admitting: Adult Health

## 2022-05-21 ENCOUNTER — Ambulatory Visit (INDEPENDENT_AMBULATORY_CARE_PROVIDER_SITE_OTHER): Payer: Medicare Other

## 2022-05-21 DIAGNOSIS — I639 Cerebral infarction, unspecified: Secondary | ICD-10-CM

## 2022-05-22 LAB — CUP PACEART REMOTE DEVICE CHECK
Date Time Interrogation Session: 20231217231717
Implantable Pulse Generator Implant Date: 20220407

## 2022-05-24 NOTE — Progress Notes (Signed)
Carelink Summary Report / Loop Recorder 

## 2022-06-01 ENCOUNTER — Other Ambulatory Visit: Payer: Self-pay | Admitting: Family Medicine

## 2022-06-01 ENCOUNTER — Other Ambulatory Visit: Payer: Self-pay | Admitting: Adult Health

## 2022-06-01 DIAGNOSIS — I69398 Other sequelae of cerebral infarction: Secondary | ICD-10-CM

## 2022-06-01 DIAGNOSIS — E559 Vitamin D deficiency, unspecified: Secondary | ICD-10-CM

## 2022-06-01 NOTE — Telephone Encounter (Signed)
Attempt to reach pt. Spoke to patient's daughter(on DPR). Inform her the rx is only for one time refill. Patient can get OTC vitamin D of 1000-2000 ui a day. Verbalized understanding.

## 2022-06-22 ENCOUNTER — Ambulatory Visit: Payer: Medicare Other

## 2022-06-25 ENCOUNTER — Ambulatory Visit: Payer: Medicare Other | Attending: Cardiology

## 2022-06-25 DIAGNOSIS — I639 Cerebral infarction, unspecified: Secondary | ICD-10-CM | POA: Diagnosis not present

## 2022-06-25 NOTE — Progress Notes (Signed)
[facility] Summary Report / Loop Recorder

## 2022-06-26 LAB — CUP PACEART REMOTE DEVICE CHECK
Date Time Interrogation Session: 20240119230540
Implantable Pulse Generator Implant Date: 20220407

## 2022-06-29 ENCOUNTER — Ambulatory Visit (INDEPENDENT_AMBULATORY_CARE_PROVIDER_SITE_OTHER): Payer: Medicare Other

## 2022-06-29 VITALS — Ht 62.5 in | Wt 164.0 lb

## 2022-06-29 DIAGNOSIS — Z0000 Encounter for general adult medical examination without abnormal findings: Secondary | ICD-10-CM

## 2022-06-29 NOTE — Patient Instructions (Addendum)
Courtney Burke , Thank you for taking time to come for your Medicare Wellness Visit. I appreciate your ongoing commitment to your health goals. Please review the following plan we discussed and let me know if I can assist you in the future.   These are the goals we discussed:  Goals       Patient stated (pt-stated)      I want to get my speech back and go back to work and make money.        This is a list of the screening recommended for you and due dates:  Health Maintenance  Topic Date Due   DEXA scan (bone density measurement)  Never done   Eye exam for diabetics  06/29/2022*   Yearly kidney health urinalysis for diabetes  06/30/2022*   Complete foot exam   06/30/2022*   COVID-19 Vaccine (4 - 2023-24 season) 07/15/2022*   Hemoglobin A1C  07/30/2022*   Zoster (Shingles) Vaccine (1 of 2) 09/28/2022*   Colon Cancer Screening  03/29/2023*   Pneumonia Vaccine (2 - PCV) 06/30/2023*   Hepatitis C Screening: USPSTF Recommendation to screen - Ages 18-79 yo.  06/30/2023*   Yearly kidney function blood test for diabetes  04/06/2023   DTaP/Tdap/Td vaccine (4 - Td or Tdap) 06/27/2023   Medicare Annual Wellness Visit  06/30/2023   Mammogram  07/06/2023   Flu Shot  Completed   HPV Vaccine  Aged Out  *Topic was postponed. The date shown is not the original due date.    Advanced directives: Advance directive discussed with you today. Even though you declined this today, please call our office should you change your mind, and we can give you the proper paperwork for you to fill out.   Conditions/risks identified: None  Next appointment: Follow up in one year for your annual wellness visit     Preventive Care 65 Years and Older, Female Preventive care refers to lifestyle choices and visits with your health care provider that can promote health and wellness. What does preventive care include? A yearly physical exam. This is also called an annual well check. Dental exams once or twice a  year. Routine eye exams. Ask your health care provider how often you should have your eyes checked. Personal lifestyle choices, including: Daily care of your teeth and gums. Regular physical activity. Eating a healthy diet. Avoiding tobacco and drug use. Limiting alcohol use. Practicing safe sex. Taking low-dose aspirin every day. Taking vitamin and mineral supplements as recommended by your health care provider. What happens during an annual well check? The services and screenings done by your health care provider during your annual well check will depend on your age, overall health, lifestyle risk factors, and family history of disease. Counseling  Your health care provider may ask you questions about your: Alcohol use. Tobacco use. Drug use. Emotional well-being. Home and relationship well-being. Sexual activity. Eating habits. History of falls. Memory and ability to understand (cognition). Work and work Statistician. Reproductive health. Screening  You may have the following tests or measurements: Height, weight, and BMI. Blood pressure. Lipid and cholesterol levels. These may be checked every 5 years, or more frequently if you are over 22 years old. Skin check. Lung cancer screening. You may have this screening every year starting at age 32 if you have a 30-pack-year history of smoking and currently smoke or have quit within the past 15 years. Fecal occult blood test (FOBT) of the stool. You may have this test every year  starting at age 70. Flexible sigmoidoscopy or colonoscopy. You may have a sigmoidoscopy every 5 years or a colonoscopy every 10 years starting at age 8. Hepatitis C blood test. Hepatitis B blood test. Sexually transmitted disease (STD) testing. Diabetes screening. This is done by checking your blood sugar (glucose) after you have not eaten for a while (fasting). You may have this done every 1-3 years. Bone density scan. This is done to screen for  osteoporosis. You may have this done starting at age 25. Mammogram. This may be done every 1-2 years. Talk to your health care provider about how often you should have regular mammograms. Talk with your health care provider about your test results, treatment options, and if necessary, the need for more tests. Vaccines  Your health care provider may recommend certain vaccines, such as: Influenza vaccine. This is recommended every year. Tetanus, diphtheria, and acellular pertussis (Tdap, Td) vaccine. You may need a Td booster every 10 years. Zoster vaccine. You may need this after age 24. Pneumococcal 13-valent conjugate (PCV13) vaccine. One dose is recommended after age 4. Pneumococcal polysaccharide (PPSV23) vaccine. One dose is recommended after age 45. Talk to your health care provider about which screenings and vaccines you need and how often you need them. This information is not intended to replace advice given to you by your health care provider. Make sure you discuss any questions you have with your health care provider. Document Released: 06/17/2015 Document Revised: 02/08/2016 Document Reviewed: 03/22/2015 Elsevier Interactive Patient Education  2017 [employer] Prevention in the Home Falls can cause injuries. They can happen to people of all ages. There are many things you can do to make your home safe and to help prevent falls. What can I do on the outside of my home? Regularly fix the edges of walkways and driveways and fix any cracks. Remove anything that might make you trip as you walk through a door, such as a raised step or threshold. Trim any bushes or trees on the path to your home. Use bright outdoor lighting. Clear any walking paths of anything that might make someone trip, such as rocks or tools. Regularly check to see if handrails are loose or broken. Make sure that both sides of any steps have handrails. Any raised decks and porches should have guardrails on  the edges. Have any leaves, snow, or ice cleared regularly. Use sand or salt on walking paths during winter. Clean up any spills in your garage right away. This includes oil or grease spills. What can I do in the bathroom? Use night lights. Install grab bars by the toilet and in the tub and shower. Do not use towel bars as grab bars. Use non-skid mats or decals in the tub or shower. If you need to sit down in the shower, use a plastic, non-slip stool. Keep the floor dry. Clean up any water that spills on the floor as soon as it happens. Remove soap buildup in the tub or shower regularly. Attach bath mats securely with double-sided non-slip rug tape. Do not have throw rugs and other things on the floor that can make you trip. What can I do in the bedroom? Use night lights. Make sure that you have a light by your bed that is easy to reach. Do not use any sheets or blankets that are too big for your bed. They should not hang down onto the floor. Have a firm chair that has side arms. You can use this for  support while you get dressed. Do not have throw rugs and other things on the floor that can make you trip. What can I do in the kitchen? Clean up any spills right away. Avoid walking on wet floors. Keep items that you use a lot in easy-to-reach places. If you need to reach something above you, use a strong step stool that has a grab bar. Keep electrical cords out of the way. Do not use floor polish or wax that makes floors slippery. If you must use wax, use non-skid floor wax. Do not have throw rugs and other things on the floor that can make you trip. What can I do with my stairs? Do not leave any items on the stairs. Make sure that there are handrails on both sides of the stairs and use them. Fix handrails that are broken or loose. Make sure that handrails are as long as the stairways. Check any carpeting to make sure that it is firmly attached to the stairs. Fix any carpet that is loose  or worn. Avoid having throw rugs at the top or bottom of the stairs. If you do have throw rugs, attach them to the floor with carpet tape. Make sure that you have a light switch at the top of the stairs and the bottom of the stairs. If you do not have them, ask someone to add them for you. What else can I do to help prevent falls? Wear shoes that: Do not have high heels. Have rubber bottoms. Are comfortable and fit you well. Are closed at the toe. Do not wear sandals. If you use a stepladder: Make sure that it is fully opened. Do not climb a closed stepladder. Make sure that both sides of the stepladder are locked into place. Ask someone to hold it for you, if possible. Clearly mark and make sure that you can see: Any grab bars or handrails. First and last steps. Where the edge of each step is. Use tools that help you move around (mobility aids) if they are needed. These include: Canes. Walkers. Scooters. Crutches. Turn on the lights when you go into a dark area. Replace any light bulbs as soon as they burn out. Set up your furniture so you have a clear path. Avoid moving your furniture around. If any of your floors are uneven, fix them. If there are any pets around you, be aware of where they are. Review your medicines with your doctor. Some medicines can make you feel dizzy. This can increase your chance of falling. Ask your doctor what other things that you can do to help prevent falls. This information is not intended to replace advice given to you by your health care provider. Make sure you discuss any questions you have with your health care provider. Document Released: 03/17/2009 Document Revised: 10/27/2015 Document Reviewed: 06/25/2014 Elsevier Interactive Patient Education  2017 [employer].

## 2022-06-29 NOTE — Progress Notes (Signed)
Subjective:   Courtney Burke is a 67 y.o. female who presents for Medicare Annual (Subsequent) preventive examination.  Review of Systems    Virtual Visit via Telephone Note  I connected with  [name] on 06/29/22 at  2:30 PM EST by telephone and verified that I am speaking with the correct person using two identifiers.  Location: Patient: Home Provider: Office Persons participating in the virtual visit: patient/Nurse Health Advisor   I discussed the limitations, risks, security and privacy concerns of performing an evaluation and management service by telephone and the availability of in person appointments. The patient expressed understanding and agreed to proceed.  Interactive audio and video telecommunications were attempted between this nurse and patient, however failed, due to patient having technical difficulties OR patient did not have access to video capability.  We continued and completed visit with audio only.  Some vital signs may be absent or patient reported.   Courtney Peaches, LPN  Cardiac Risk Factors include: advanced age (>10mn, >>67women);hypertension     Objective:    Today's Vitals   06/29/22 1441  Weight: 164 lb (74.4 kg)  Height: 5' 2.5" (1.588 m)   Body mass index is 29.52 kg/m.     06/29/2022    2:54 PM 11/09/2021    7:00 PM 06/09/2021    4:38 PM 06/01/2021   11:38 AM 05/24/2021    7:07 PM 01/17/2021    6:44 AM 07/09/2020    4:20 PM  Advanced Directives  Does Patient Have a Medical Advance Directive? No No No No No No No  Would patient like information on creating a medical advance directive? No - Patient declined Yes (Inpatient - patient requests chaplain consult to create a medical advance directive) No - Patient declined No - Patient declined No - Patient declined No - Patient declined No - Patient declined    Current Medications (verified) Outpatient Encounter Medications as of 06/29/2022  Medication Sig   acetaminophen  (TYLENOL) 500 MG tablet Take 1,000 mg by mouth every 6 (six) hours as needed for moderate pain or headache.   amitriptyline (ELAVIL) 50 MG tablet Take 1 tablet (50 mg total) by mouth at bedtime.   aspirin EC 81 MG tablet Take 1 tablet (81 mg total) by mouth daily. Swallow whole.   atorvastatin (LIPITOR) 80 MG tablet Take 1 tablet (80 mg total) by mouth daily.   ELIQUIS 5 MG TABS tablet TAKE 1 TABLET BY MOUTH TWICE A DAY   KLOR-CON M20 20 MEQ tablet Take 40 mEq by mouth 2 (two) times daily.   levETIRAcetam (KEPPRA) 750 MG tablet Take 2 tablets (1,500 mg total) by mouth 2 (two) times daily.   metoCLOPramide (REGLAN) 5 MG tablet Take 1 tablet (5 mg total) by mouth every 8 (eight) hours as needed for up to 10 days for nausea or vomiting.   Multiple Vitamin (MULTIVITAMIN WITH MINERALS) TABS tablet Take 1 tablet by mouth daily.   pantoprazole (PROTONIX) 40 MG tablet Take 40 mg by mouth daily.   sertraline (ZOLOFT) 50 MG tablet Take 50 mg by mouth daily.   topiramate (TOPAMAX) 25 MG tablet TAKE 1 TABLET (25 MG TOTAL) BY MOUTH 2 (TWO) TIMES DAILY. START '25MG'$  TWICE DAILY AND INCREASE TO '50MG'$  TWICE DAILY   ursodiol (ACTIGALL) 250 MG tablet Take 250 mg by mouth 2 (two) times daily.   Vitamin D, Ergocalciferol, (DRISDOL) 1.25 MG (50000 UNIT) CAPS capsule Take 1 capsule (50,000 Units total) by mouth every 7 (  seven) days.   No facility-administered encounter medications on file as of 06/29/2022.    Allergies (verified) Homatropine, Iodinated contrast media, Doxycycline, Sulfa antibiotics, Codeine, Hydrocodone, Hydrocodone bit-homatrop mbr, and Ace inhibitors   History: Past Medical History:  Diagnosis Date   Acute ischemic left MCA stroke (HCC) 06/30/2020   Acute ischemic stroke (HCC)    Acute stroke due to occlusion of left middle cerebral artery ([place]) 06/30/2020   Age-related nuclear cataract of both eyes 09/28/2015   Arthralgia of multiple joints 09/28/2015   Asthma    Borderline diabetes     Cerebrovascular accident (CVA) ([place])    Chronic bilateral thoracic back pain 06/05/2018   Chronic renal insufficiency, stage 1 08/11/2014   Diabetes mellitus type 2 in obese ([place])    Dyslipidemia    Dysphagia, post-stroke    Erosive gastritis 07/20/2019   Formatting of this note might be different from the original. On EGD 07/2019   Esophagitis 07/20/2019   Formatting of this note might be different from the original. Added automatically from request for surgery [id number]  Formatting of this note might be different from the original. On EGD 07/2019   Essential hypertension 09/28/2015   Essential thrombocytosis ([place]) 09/28/2015   Gallstones 08/18/2019   Formatting of this note might be different from the original. 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   Morbid obesity ([place]) 05/21/2013   Seizures ([place])    Stage 3b chronic kidney disease ([place])    Status post gastric bypass for obesity 09/28/2015   Past Surgical History:  Procedure Laterality Date   ANKLE SURGERY     CARPAL TUNNEL RELEASE     carpel tunnel     ESOPHAGOGASTRODUODENOSCOPY (EGD) WITH PROPOFOL N/A 11/14/2021   Procedure: ESOPHAGOGASTRODUODENOSCOPY (EGD) WITH PROPOFOL;  Surgeon: Gatha Mayer, MD;  Location: [facility];  Service: Gastroenterology;  Laterality: N/A;   IR ANGIO INTRA EXTRACRAN SEL COM CAROTID INNOMINATE BILAT MOD SED  01/17/2021   IR ANGIO VERTEBRAL SEL SUBCLAVIAN INNOMINATE UNI R MOD SED  01/17/2021   IR CT HEAD LTD  06/30/2020   IR INTRA CRAN STENT  06/30/2020   IR PERCUTANEOUS ART THROMBECTOMY/INFUSION INTRACRANIAL INC DIAG ANGIO  06/30/2020   IR RADIOLOGIST EVAL & MGMT  08/26/2020   IR US GUIDE VASC  ACCESS RIGHT  01/17/2021   RADIOLOGY WITH ANESTHESIA N/A 06/30/2020   Procedure: RADIOLOGY WITH ANESTHESIA;  Surgeon: Radiologist, Medication, MD;  Location: [facility];  Service: Radiology;  Laterality: N/A;   Family History  Problem Relation Age of Onset   Stroke Maternal Grandfather    Social History   Socioeconomic History   Marital status: Divorced    Spouse name: Not on file   Number of children: Not on file   Years of education: Not on file   Highest education level: Not on file  Occupational History   Not on file  Tobacco Use   Smoking status: Never   Smokeless tobacco: Never  Vaping Use   Vaping Use: Never used  Substance and Sexual Activity   Alcohol use: No   Drug use: No   Sexual activity: Not on file  Other Topics Concern   Not on file  Social History Narrative   Lives with daughter  Lakisha   Right Handed   Drinks no caffeine   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[place] Strain: [place]  (06/29/2022)   Overall Financial Resource Strain (CARDIA)    Difficulty of Paying Living Expenses: Not hard at all  Food Insecurity: No Food Insecurity (06/29/2022)   Hunger Vital Sign    Worried About Running Out of Food in the Last Year: Never true    [place] in the Last Year: Never true  Transportation Needs: No Transportation Needs (06/29/2022)   PRAPARE - Hydrologist (Medical): No    Lack of Transportation (Non-Medical): No  Physical Activity: Inactive (06/29/2022)   Exercise Vital Sign    Days of Exercise per Week: 0 days    Minutes of Exercise per Session: 0 min  Stress: No Stress Concern Present (06/29/2022)   [place]    Feeling of Stress : Not at all  Social Connections: Moderately Integrated (06/29/2022)   Social Connection and Isolation Panel [NHANES]    Frequency  of Communication with Friends and Family: More than three times a week    Frequency of Social Gatherings with Friends and Family: More than three times a week    Attends Religious Services: More than 4 times per year    Active Member of [employer] or Organizations: Yes    Attends Music therapist: More than 4 times per year    Marital Status: Divorced    Tobacco Counseling Counseling given: Not Answered   Clinical Intake:  Pre-visit preparation completed: No  Pain : No/denies pain     BMI - recorded: 29.52 Nutritional Status: BMI 25 -29 Overweight Nutritional Risks: None Diabetes: No  How often do you need to have someone help you when you read instructions, pamphlets, or other written materials from your doctor or pharmacy?: 2 - Rarely (Daughter Assist)  Diabetic?  No  Interpreter Needed?: No  Information entered by :: Rolene Arbour LPN   Activities of Daily Living    06/29/2022    2:48 PM 11/09/2021    7:00 PM  In your present state of health, do you have any difficulty performing the following activities:  Hearing? 0 0  Vision? 0 0  Difficulty concentrating or making decisions? 1 0  Comment Daughter assist   Walking or climbing stairs? 0 0  Dressing or bathing? 0 0  Doing errands, shopping? 1 1  Comment Daughter Diplomatic Services operational officer and eating ? N   Using the Toilet? N   In the past six months, have you accidently leaked urine? N   Do you have problems with loss of bowel control? N   Managing your Medications? N   Managing your Finances? N   Housekeeping or managing your Housekeeping? N     Patient Care Team: Billie Ruddy, MD as PCP - General (Family Medicine) [name], MD as PCP - Electrophysiology (Cardiology) Ward, Sarah, [place] (Inactive) (General Practice) Frann Rider, NP as Nurse Practitioner (Neurology) Letta Pate Luanna Salk, MD as Consulting Physician (Physical Medicine and Rehabilitation) Dasher, Jeneen Rinks, MD  (Surgery) Damaris Schooner (Family Medicine) [name], MD as Consulting Physician (Cardiology)  Indicate any recent Medical Services you may have received from other than [facility] providers in the past year (date may be approximate).  Assessment:   This is a routine wellness examination for Cheresa.  Hearing/Vision screen Hearing Screening - Comments:: Denies hearing difficulties   Vision Screening - Comments:: Wears reading glasses - up to date with routine eye exams with [facility]   Dietary issues and exercise activities discussed: Exercise limited by: None identified;neurologic condition(s)   Goals Addressed               This Visit's Progress     Patient stated (pt-stated)        I want to get my speech back and go back to work and make money.       Depression Screen    06/29/2022    2:47 PM 03/28/2022   10:17 AM 07/18/2021    7:06 PM 07/14/2021   10:02 AM 06/30/2021    6:05 PM 05/24/2021    7:04 PM 01/12/2021    1:46 PM  PHQ 2/9 Scores  PHQ - 2 Score 0 '2 1 2 1 2 '$ 0  PHQ- 9 Score  6    4     Fall Risk    06/29/2022    2:52 PM 03/28/2022   10:17 AM 07/14/2021   10:02 AM 01/12/2021    1:46 PM 10/14/2020   10:21 AM  Fall Risk   Falls in the past year? 1 1 0 0 0  Number falls in past yr: 0 0  0   Injury with Fall? 0 0  0   Risk for fall due to : No Fall Risks Impaired balance/gait     Follow up Falls prevention discussed Falls evaluation completed       FALL RISK PREVENTION PERTAINING TO THE HOME:  Any stairs in or around the home? Yes  If so, are there any without handrails? No  Home free of loose throw rugs in walkways, pet beds, electrical cords, etc? Yes  Adequate lighting in your home to reduce risk of falls? Yes   ASSISTIVE DEVICES UTILIZED TO PREVENT FALLS:  Life alert? No  Use of a cane, walker or w/c? Yes  Grab bars in the bathroom? Yes Shower chair or bench in shower? Yes  Elevated toilet seat or a handicapped toilet?  Yes   TIMED UP AND GO:  Was the test performed? No . Visit   Cognitive Function:        06/29/2022    2:54 PM  6CIT Screen  What Year? 4 points  What month? 3 points  What time? 3 points  Count back from 20 4 points  Months in reverse 4 points  Repeat phrase 10 points  Total Score 28 points    Immunizations Immunization History  Administered Date(s) Administered   Fluad Quad(high Dose 65+) 03/28/2022   Influenza Split 05/17/2013, 03/30/2014, 04/08/2015   Influenza, High Dose Seasonal PF 04/21/2020   Influenza, Quadrivalent, Recombinant, Inj, Pf 05/09/2017   Influenza,inj,Quad PF,6-35 Mos 05/08/2016, 03/31/2018   PFIZER(Purple Top)SARS-COV-2 Vaccination 08/06/2019, 08/28/2019, 04/21/2020   Pneumococcal Polysaccharide-23 10/02/2002   Td 03/26/1994, 12/17/2005   Tdap 06/26/2013    TDAP status: Up to date  Flu Vaccine status: Up to date  Pneumococcal vaccine status: Due, Education has been provided regarding the importance of this vaccine. Advised may receive this vaccine at local pharmacy or Health Dept. Aware to provide a copy of the vaccination record if obtained from local pharmacy or Health Dept. Verbalized acceptance and understanding.  Covid-19 vaccine status: Completed vaccines  Qualifies for Shingles Vaccine? Yes   Zostavax completed No  Shingrix Completed?: No.    Education has been provided regarding the importance of this vaccine. Patient has been advised to call insurance company to determine out of pocket expense if they have not yet received this vaccine. Advised may also receive vaccine at local pharmacy or Health Dept. Verbalized acceptance and understanding.  Screening Tests Health Maintenance  Topic Date Due   DEXA SCAN  Never done   OPHTHALMOLOGY EXAM  06/29/2022 (Originally 07/29/1965)   Diabetic kidney evaluation - Urine ACR  06/30/2022 (Originally 07/29/1973)   FOOT EXAM  06/30/2022 (Originally 07/29/1965)   COVID-19 Vaccine (4 - 2023-24 season)  07/15/2022 (Originally 02/02/2022)   HEMOGLOBIN A1C  07/30/2022 (Originally 05/11/2022)   Zoster Vaccines- Shingrix (1 of 2) 09/28/2022 (Originally 07/29/1974)   COLONOSCOPY (Pts 45-57yr Insurance coverage will need to be confirmed)  03/29/2023 (Originally 07/30/1999)   Pneumonia Vaccine 68 Years old (2 - PCV) 06/30/2023 (Originally 10/02/2003)   Hepatitis C Screening  06/30/2023 (Originally 07/29/1973)   Diabetic kidney evaluation - eGFR measurement  04/06/2023   DTaP/Tdap/Td (4 - Td or Tdap) 06/27/2023   Medicare Annual Wellness (AWV)  06/30/2023   MAMMOGRAM  07/06/2023   INFLUENZA VACCINE  Completed   HPV VACCINES  Aged Out    Health Maintenance  Health Maintenance Due  Topic Date Due   DEXA SCAN  Never done    Colorectal cancer screening: Referral to GI placed Patient deferred. Pt aware the office will call re: appt.  Mammogram status: Completed 07/05/21. Repeat every year  Bone Density status: Ordered 03/28/22. Pt provided with contact info and advised to call to schedule appt.  Lung Cancer Screening: (Low Dose CT Chest recommended if Age 68-80years, 30 pack-year currently smoking OR have quit w/in 15years.) does not qualify.     Additional Screening:  Hepatitis C Screening: does not qualify; Completed [facility]  Vision Screening: Recommended annual ophthalmology exams for early detection of glaucoma and other disorders of the eye. Is the patient up to date with their annual eye exam?  No Who is the provider or what is the name of the office in which the patient attends annual eye exams? [facility] If pt is not established with a provider, would they like to be referred to a provider to establish care? No .   Dental Screening: Recommended annual dental exams for proper oral hygiene  Community Resource Referral / Chronic Care Management:  CRR required this visit?  No   CCM required this visit?  No      Plan:     I have personally reviewed and noted the following in the  patient's chart:   Medical and social history Use of alcohol, tobacco or illicit drugs  Current medications and supplements including opioid prescriptions. Patient is not currently taking opioid prescriptions. Functional ability and status Nutritional status Physical activity Advanced directives List of other physicians Hospitalizations, surgeries, and ER visits in previous 12 months Vitals Screenings to include cognitive, depression, and falls Referrals and appointments  In addition, I have reviewed and discussed with patient certain preventive protocols, quality metrics, and best practice recommendations. A written personalized care plan for preventive services as well as general preventive health recommendations were provided to patient.     BCriselda Peaches LPN    Notes: Patient due Diabetic Kidney Evaluation- Urine ACR, Hep-C Screening and Hemoglobin A1C. Patient request f/u with concerns of continued headaches.

## 2022-07-12 ENCOUNTER — Encounter (HOSPITAL_COMMUNITY): Payer: Self-pay | Admitting: *Deleted

## 2022-07-26 ENCOUNTER — Ambulatory Visit (INDEPENDENT_AMBULATORY_CARE_PROVIDER_SITE_OTHER): Payer: Medicare Other

## 2022-07-26 DIAGNOSIS — I639 Cerebral infarction, unspecified: Secondary | ICD-10-CM

## 2022-07-26 LAB — CUP PACEART REMOTE DEVICE CHECK
Date Time Interrogation Session: 20240221231630
Implantable Pulse Generator Implant Date: 20220407

## 2022-08-03 ENCOUNTER — Other Ambulatory Visit (HOSPITAL_COMMUNITY): Payer: Self-pay | Admitting: Cardiology

## 2022-08-03 DIAGNOSIS — I480 Paroxysmal atrial fibrillation: Secondary | ICD-10-CM

## 2022-08-03 NOTE — Telephone Encounter (Signed)
Eliquis '5mg'$  refill request received. Patient is 67 years old, weight-74.4kg, Crea-1.38 on 04/05/22, Diagnosis-Afib, and last seen by Oda Kilts on 09/28/21. Dose is appropriate based on dosing criteria. Will send in refill to requested pharmacy.

## 2022-08-10 NOTE — Progress Notes (Signed)
Carelink Summary Report / Loop Recorder 

## 2022-08-20 NOTE — Progress Notes (Signed)
Carelink Summary Report / Loop Recorder 

## 2022-08-23 ENCOUNTER — Encounter: Payer: Self-pay | Admitting: Adult Health

## 2022-08-23 ENCOUNTER — Telehealth: Payer: Self-pay | Admitting: Adult Health

## 2022-08-23 ENCOUNTER — Ambulatory Visit (INDEPENDENT_AMBULATORY_CARE_PROVIDER_SITE_OTHER): Payer: Medicare Other | Admitting: Adult Health

## 2022-08-23 VITALS — BP 121/78 | HR 84 | Ht 62.0 in | Wt 177.0 lb

## 2022-08-23 DIAGNOSIS — I69398 Other sequelae of cerebral infarction: Secondary | ICD-10-CM

## 2022-08-23 DIAGNOSIS — R471 Dysarthria and anarthria: Secondary | ICD-10-CM

## 2022-08-23 DIAGNOSIS — R569 Unspecified convulsions: Secondary | ICD-10-CM

## 2022-08-23 DIAGNOSIS — I63411 Cerebral infarction due to embolism of right middle cerebral artery: Secondary | ICD-10-CM | POA: Diagnosis not present

## 2022-08-23 DIAGNOSIS — G441 Vascular headache, not elsewhere classified: Secondary | ICD-10-CM | POA: Diagnosis not present

## 2022-08-23 DIAGNOSIS — E785 Hyperlipidemia, unspecified: Secondary | ICD-10-CM

## 2022-08-23 MED ORDER — TOPIRAMATE 100 MG PO TABS: 100.0000 mg | ORAL_TABLET | Freq: Two times a day (BID) | ORAL | 5 refills | Status: DC

## 2022-08-23 MED ORDER — NURTEC 75 MG PO TBDP: 75.0000 mg | ORAL_TABLET | ORAL | 5 refills | Status: DC | PRN

## 2022-08-23 NOTE — Progress Notes (Signed)
[facility] [address] [place] [address] [phone]       OFFICE FOLLOW-UP NOTE  [name] Date of Birth:  [birth year] Medical Record Number:  [id number]    Reason for visit: Stroke, seizure, headaches Primary neurologist: Dr. Leonie Man  Chief Complaint  Patient presents with   Follow-up    Patient in room #3 with her daughter. Patient daughter states a few weeks ago she felt her mother was slurring her words. Patient states she has been vomiting and headaches.     HPI:   Update 08/23/2022 JM: Patient returns for routine follow-up accompanied by her daughter.  Seizure: At prior visit, reported seizure event likely unprovoked.  Reported compliance on Keppra 1500 mg twice daily.  She was started on topiramate with gradual titration to 50 mg twice daily (which can also help with reported continued headaches).  She has not had any additional seizure activity and tolerated medications well.   Headache: still having headaches but improved since starting topamax, currently having about 2-3 per week, will take extra dose of aspirin as needed for headache (in addition to daily 81mg  dosage).    Stroke: daughter reports episode last week where she was slurring her words while laying down in bed, lasted about 30 minutes. Was fatigued at that time. Was able to follow commands and answer questions but daughter believes she had slight confusion. Unsure if headache present.  No evidence of weakness or any other neurological deficits at that time. Daughter dose endorse increased stressors and unsure if this was related.  No other new stroke/[name] symptoms since that time. Continued aphasia stable.  Compliant on Eliquis, aspirin and atorvastatin.  Blood pressure well-controlled.  Routinely follows with PCP and cardiology.        History provided for reference purposes only Update 04/05/2022 JM: Patient returns for 60-month follow-up accompanied by her  daughter  Seizure: Currently on Keppra 1500 mg twice daily Daughter reports seizure last week, was sitting on the side of her bed folding laundry talking with her daughter and then suddenly stopped talking and leaned back into bed frame, eyes open, was not responding to daughter, after a couple of minutes daughter splashed water on her face and patient came to.  She was drowsy for short while after.  No additional events since that time.  Denies any missed dosages of Keppra, recent illness or infection or other specific provoking factors.   Headaches: Currently on amitriptyline 50 mg nightly Reports continued headaches, some improvement initially after increasing amitriptyline dosage at last visit but more lately has been having more headaches.  Reports these occur almost daily. Will use Tylenol couple times weekly for more severe headaches. Daughter mentions she has not been sleeping well recently. Currently working with PCP regarding this.  She also complains of daytime fatigue, recently started on vitamin D supplement for vitamin D deficiency.    Stroke: Stable. No new stroke/[name] symptoms. Remains on Eliquis, aspirin and atorvastatin Blood pressure well controlled   Update 11/30/2021 JM: Patient returns for follow-up visit accompanied by her daughter.  Previously seen 3 months ago with complaints of worsening headache as well as breakthrough seizure.   Keppra dosage increased to 1500 mg twice daily, tolerating without side effects, denies any recurrent seizure activity.    Reports continued daily frontal headaches 2-3x per day, usually short lasting, takes tylenol 1000mg  of tyneol 2-3x daily which resolves headaches.  Remains on amitriptyline 25 mg nightly, denies side effects.  Stable from  stroke standpoint without new stroke/[name] symptoms.  Residual speech deficit stable.  Compliant on Eliquis, and aspirin, denies side effects. Has had difficulty obtaining amlodipine and atorvastatin  refill, has been out over the past 3 months, reports PCP will not refill as she did not start medications.  blood pressure today 123/77. Monitors at home and has been stable.   Was hospitalized 6/7 through 6/14 for 1 week nausea/vomiting and intermittent abdominal pain.  No clear source identified.  Did note dehydration with elevated creatinine and anemia, hypokalemia, hypomagnesemia and hyponatremia. She has not had any additional n/v or abdominal pains.  No further concerns at this time   Update 08/28/2021 JM: Returns for acute visit due to complaints of worsening headaches.  At prior visit 2 months ago, she was started on amitriptyline for headache complaints since her stroke in 05/2021. Has been taking amitriptyline 25mg  nightly - per daughter, thought they were improving as no mention of having headaches since starting amitriptyline but at cardiology visit last week, was complaining of headache and not feeling right upon arriving to appt. She was able to ambulate back but walking slow, sat down and then stopped talking. Mouth was twisted towards the side, teeth clenched, and lost consciousness for 2-3 minutes, she did have postictal confusion but improved after a few minutes. She was not sent to ED as she recovered back to baseline within reasonable time. No additional seizures since that time. She has been complaining of a headache since her seizure but no additional headaches over the past 2 days. Denies any new/worsening speech/language difficulties, weakness, vision changes or mental status changes. Daughter does report starting on sertraline 50mg  daily by psychiatry 2 weeks ago for depression, no other medication changes. No recent illness, trauma, falls, or other potential provoking factors. She does not drink alcohol or use recreational drugs.  Reports compliance with all medications.  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 fib after presenting with headache, worsening speech and confusion. MRA right ICA 50% stenosis and severe proximal right M2 stenosis, L M1 stent patent, no LVO.  On Eliquis twice daily and Brilinta PTA recommended continuation as well as adding aspirin 81 mg daily.  LDL 21 on atorvastatin.  A1c 5.5.  EEG generalized slowing but no seizures - remained on keppra. Discharged 12/7. Returned on 12/12 after episode of vomiting and syncopal episode with MRI showing extension of recent R MCA stroke likely in setting of hypotension/syncope in setting of right ICA and M2 stenosis. Continued Eliquis and asa -Brilinta discontinued.  Concern of possible seizure on 12/14 with staring off and not responsive, confusion for a few minutes after therefore increased Keppra to 1g BID. LDL 21 - decreased atorvastatin to 40 mg daily. BP stabilized on IVF, orthostatics unremarkable.  Residual expressive aphasia with word salad. Therapy evals recommended OP therapies.    Today she is accompanied by her daughter, Courtney Burke. Having headaches since recent stroke. Will take tylenol with some benefit but will return. Continues to work with SLP noting improvement, family working with her on reading out loud and conversations. Per daughter, patient will push herself too much at times and try to do too much at once. Family tries to ensure she takes breaks. Denies weakness, some imbalance but overall stable. Does c/o fear of additional strokes and difficulty sleeping at night due to mind wandering. No prior issues with depression/anxiety.  Reports compliance on Eliquis 5 mg twice daily and atorvastatin 40 mg daily without side effects. Has not been taking aspirin.  Blood pressure today 151/78 and on recheck, 132/82.  Compliant on Keppra but over past week has been taking 500mg  BID due to difficulty refilling new dose (had 500mg  tabs left over from prior prescription). Was doing well on 1000mg   BID.   Of note, diagnosed with COVID-19 12/29 after presenting to ED with lightheadedness, dizziness, nausea and vomiting and possible loss of consciousness or near syncope.  Extensive work-up largely unrevealing except COVID-positive which was felt to be contributing to her symptoms. MR brain and MRA head/neck no acute findings or changes from prior recent imaging.   No further concerns at this time     PERTINENT IMAGING  Per hospitalization 05/15/2021 CT head - Slightly increased hypodensity in the right posterior temporal lobe and lateral occipital lobe, consistent with evolution of previously noted acute infarct. MRI  Extension of the acute infarction in the right MCA territory at the right temporoparietal junction, particularly along the inferior margin of the infarction and with a few tiny punctate satellite foci additionally present. MRA 12/7 - Approximate 50% stenosis at the supraclinoid right ICA, with probable additional short-segment severe proximal right M2 stenosis.  EEG cortical dysfunction arising from left and right temporal region, no seizure   Per hospitalization 05/10/2021 CT head Old left MCA territory strokes. Newly seen loss of gray-white differentiation at the right temporoparietal junction suggesting acute infarction in that region today. No hemorrhage or mass effect MRI  1. Acute ischemic nonhemorrhagic infarct involving the right temporoccipital junction, posterior right MCA distribution.2. Chronic left MCA territory infarcts, stable from prior.3. Additional small remote right cerebellar infarct.4. Underlying age-related cerebral atrophy with mild chronic small vessel ischemic disease. MRA  head1. Negative intracranial MRA for large vessel occlusion.2. Approximate 50% stenosis at the supraclinoid right ICA, with probable additional short-segment severe proximal right M2 stenosis.3. Vascular stent in place within the left M1 segment. Flow through the stent itself not  evaluated by MRA, although patent flow seen distal to the stent.4. Short-segment severe left M2 stenosis, similar to previous.5. Wide patency of the posterior circulation MRA Neck Wide patency of both carotid artery systems and vertebral arteries within the neck. No hemodynamically significant stenosis or other acute vascular abnormality. 2D Echo EF 55 to 60% EEG generalized slowing.  No seizures identified      Update 02/13/2021 JM: Courtney Burke returns for acute visit in setting of recent seizure activity.  She is accompanied by her brother.  Seizure type event 02/07/2021 evaluated in ED with symptoms of headache and then tonic-clonic jerking lasting 5 to 6 minutes with residual confusion and bowel incontinence.  Initiated Keppra 500 mg twice daily. CT head unremarkable.  No additional events since discharge.  Remains on Keppra 500 mg twice daily tolerating without side effects.  Reports slight worsening of speech since seizure event.  Was working with SLP and PT prior to seizure but has not yet restarted.  Continues to use a cane and denies any recent falls.  Denies new stroke/[name] symptoms.  Remains on Eliquis and atorvastatin.  Also compliant on Brilinta per IR recommendations.  No further concerns at this time.  Update 01/04/2021 JM: Courtney Burke returns for 76-month stroke follow-up accompanied by her brother, Merry Proud.  Working with SLP for residual aphasia but notes ongoing improvement. Gait has also been improving working with PT - uses cane - did have a fall last month but  thankfully without injury.  Denies new stroke/[name] symptoms. ILR showed evidence of atrial fibrillation on 10/05/2020 -Eliquis initiated in addition to [place]. ASA d/c'd.  She does have mild bruising but otherwise tolerating.  Has not yet scheduled diagnostic angiogram with Dr. Estanislado Pandy.  Compliant on atorvastatin without associated side effects.  Blood pressure 136/82. Monitors at home and typically stable 120s/70s.  No further concerns at  this time.  Initial visit 10/05/2020 Dr. Leonie Man: Courtney Burke is a 67 year old African-American lady seen today for initial office follow-up visit following hospital admission for stroke in January 2022.  She is accompanied by her daughter.  History is obtained from them and review of hospital electronic medical records and I personally reviewed imaging films in PACS.  She is a 67 year old pleasant African-American lady with past medical history of diabetes, hypertension, chronic kidney disease stage IIIa, dysphagia, obesity, s/p gastric sleeve surgery in December 2021.  She was brought in as a code stroke by EMS after she developed altered mental status.  She had a witnessed episodes in which her eyes rolled back in her head twice with brief loss of consciousness.  She is subsequently found to have right-sided weakness.  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  Aspect score was 6 on admission.  Infarct core was 11 to 20 cc only.  After discussion of risk benefits with the patient's family patient was taken for emergent mechanical thrombectomy which was performed by Dr. Estanislado Pandy but there was underlying MCA stenosis requiring rescue MCA stenting with resultant TICI 2C revascularization.  She was admitted admitted to the intensive care unit blood pressure tightly controlled.  She was subsequently extubated and found to have global aphasia and right hemiparesis.  MRI scan showed acute infarct involving left MCA territory of basal ganglia as well as left patchy temporal frontal and parietal lobes.  There is small area of hemorrhage noted in the left posterior temporal lobe.  2D echo showed ejection fraction of 6065%.  Lower extremity venous Dopplers negative for  DVT.  LDL cholesterol 68 mg percent.  Hemoglobin A1c was 5.4.  Patient was started on aspirin and Brilinta for MCA stent.  History of dysphagia and had a feeding tube inserted.  She was transferred to inpatient rehab where she stayed for few weeks.  She also started on Keppra for seizure prophylaxis due to her episodes of seizure-like presentation.  EEG she had shown intermittent rhythmic delta slowing in the left frontal region.  Patient has been at home since rehab.  She is to get getting physical occupational and speech therapy which is now down to 1 day a week.  She is able to speak better but still has to speak slowly and occasionally gets stuck and then some word finding difficulties.  She has noticed improvement in right-sided strength and now she can walk independently but needs a walker.  She still has some dragging of the right foot.  Her balance is poor.  However she has had no falls or injuries.  She is able to manage most activities of daily living but needs a little supervision.  She is tolerating aspirin and Brilinta well with only minor bruising and no bleeding.  Blood pressures well controlled today it is  elevated in the office at 153/75.  She remains on Lipitor which is tolerating well without muscle aches and pains.    ROS:   14 system review of systems is positive for those listed in HPI and all other systems negative  PMH:  Past Medical History:  Diagnosis Date   Acute ischemic left MCA stroke ([place]) 06/30/2020   Acute ischemic stroke (HCC)    Acute stroke due to occlusion of left middle cerebral artery ([place]) 06/30/2020   Age-related nuclear cataract of both eyes 09/28/2015   Arthralgia of multiple joints 09/28/2015   Asthma    Borderline diabetes    Cerebrovascular accident (CVA) ([place])    Chronic bilateral thoracic back pain 06/05/2018   Chronic renal insufficiency, stage 1 08/11/2014   Diabetes mellitus type 2 in obese ([place])    Dyslipidemia    Dysphagia, post-stroke     Erosive gastritis 07/20/2019   Formatting of this note might be different from the original. On EGD 07/2019   Esophagitis 07/20/2019   Formatting of this note might be different from the original. Added automatically from request for surgery [id number]  Formatting of this note might be different from the original. On EGD 07/2019   Essential hypertension 09/28/2015   Essential thrombocytosis ([place]) 09/28/2015   Gallstones 08/18/2019   Formatting of this note might be different from the original. 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   Hyperlipidemia, unspecified 03/16/2013   Hypertension    Keratoconjunctivitis sicca of both eyes not specified as Sjogren's 09/28/2015   Left middle cerebral artery stroke ([place]) 07/09/2020   Morbid obesity ([place]) 05/21/2013   Seizures ([place])    Stage 3b chronic kidney disease ([place])    Status post gastric bypass for obesity 09/28/2015    Social History:  Social History   Socioeconomic History   Marital status: Divorced    Spouse name: Not on file   Number of children: Not on file   Years of education: Not on file   Highest education level: Not on file  Occupational History   Not on file  Tobacco Use   Smoking status: Never   Smokeless tobacco: Never  Vaping Use   Vaping Use: Never used  Substance and Sexual Activity   Alcohol use: No   Drug use: No   Sexual activity: Not on file  Other Topics Concern   Not on file  Social History Narrative   Lives with daughter Courtney Burke   Right [place] no caffeine   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[place] Strain: [place]  (06/29/2022)   Overall Financial Resource Strain (CARDIA)    Difficulty of Paying Living Expenses: Not hard at all  Food Insecurity: No Food Insecurity (06/29/2022)    Hunger Vital Sign    Worried About Running Out of Food in the Last Year: Never true    Ran Out of Food in the Last Year: Never true  Transportation Needs: No Transportation Needs (06/29/2022)   PRAPARE - Hydrologist (Medical): No    Lack of Transportation (Non-Medical): No  Physical Activity: Inactive (06/29/2022)   Exercise Vital Sign    Days of Exercise per Week: 0 days    Minutes of Exercise per Session: 0 min  Stress: No  Stress Concern Present (06/29/2022)   [place]    Feeling of Stress : Not at all  Social Connections: Moderately Integrated (06/29/2022)   Social Connection and Isolation Panel [NHANES]    Frequency of Communication with Friends and Family: More than three times a week    Frequency of Social Gatherings with Friends and Family: More than three times a week    Attends Religious Services: More than 4 times per year    Active Member of [employer] or Organizations: Yes    Attends Music therapist: More than 4 times per year    Marital Status: Divorced  Intimate Partner Violence: Not At Risk (06/29/2022)   Humiliation, Afraid, Rape, and Kick questionnaire    Fear of Current or Ex-Partner: No    Emotionally Abused: No    Physically Abused: No    Sexually Abused: No    Medications:   Current Outpatient Medications on File Prior to Visit  Medication Sig Dispense Refill   acetaminophen (TYLENOL) 500 MG tablet Take 1,000 mg by mouth every 6 (six) hours as needed for moderate pain or headache.     amitriptyline (ELAVIL) 50 MG tablet Take 1 tablet (50 mg total) by mouth at bedtime. 30 tablet 5   aspirin EC 81 MG tablet Take 1 tablet (81 mg total) by mouth daily. Swallow whole.     atorvastatin (LIPITOR) 80 MG tablet Take 1 tablet (80 mg total) by mouth daily. 90 tablet 3   ELIQUIS 5 MG TABS tablet TAKE 1 TABLET BY MOUTH TWICE A DAY 60 tablet 5   KLOR-CON M20 20 MEQ  tablet Take 40 mEq by mouth 2 (two) times daily.     levETIRAcetam (KEPPRA) 750 MG tablet Take 2 tablets (1,500 mg total) by mouth 2 (two) times daily. 120 tablet 6   Multiple Vitamin (MULTIVITAMIN WITH MINERALS) TABS tablet Take 1 tablet by mouth daily.     pantoprazole (PROTONIX) 40 MG tablet Take 40 mg by mouth daily.     sertraline (ZOLOFT) 50 MG tablet Take 50 mg by mouth daily.     ursodiol (ACTIGALL) 250 MG tablet Take 250 mg by mouth 2 (two) times daily.     Vitamin D, Ergocalciferol, (DRISDOL) 1.25 MG (50000 UNIT) CAPS capsule Take 1 capsule (50,000 Units total) by mouth every 7 (seven) days. 12 capsule 0   metoCLOPramide (REGLAN) 5 MG tablet Take 1 tablet (5 mg total) by mouth every 8 (eight) hours as needed for up to 10 days for nausea or vomiting. 30 tablet 0   No current facility-administered medications on file prior to visit.    Allergies:   Allergies  Allergen Reactions   Homatropine Itching   Iodinated Contrast Media Itching   Doxycycline Nausea And Vomiting   Sulfa Antibiotics Rash and Hives   Codeine Hives and Swelling    Swollen tongue   Hydrocodone Itching   Hydrocodone Bit-Homatrop Mbr Itching   Ace Inhibitors Cough, Itching and Rash    Physical Exam Today's Vitals   08/23/22 1403  BP: 121/78  Pulse: 84  Weight: 177 lb (80.3 kg)  Height: 5\' 2"  (1.575 m)   Body mass index is 32.37 kg/m.    General: well developed, well nourished very pleasant middle-aged African-American lady, seated, in no evident distress Head: head normocephalic and atraumatic.  Neck: supple with no carotid or supraclavicular bruits Cardiovascular: regular rate and rhythm, no murmurs Musculoskeletal: no deformity Skin:  no rash/petichiae  Vascular:  Normal pulses all extremities  Neurologic Exam Mental Status: Awake and fully alert.  moderate expressive > mild receptive aphasia.  No evidence of dysarthria.  Able to follow simple step commands without difficulty.  Improvement of  comprehension although some impairment.  Oriented to place and person.  Recent memory impaired and remote memory intact. Attention span, concentration and fund of knowledge appropriate during visit. Mood and affect appropriate.   Cranial Nerves: Pupils equal, briskly reactive to light. Extraocular movements full without nystagmus. Visual fields full to confrontation. Hearing intact. Facial sensation intact.  Face, tongue, palate moves normally and symmetrically.  Motor: Normal bulk and tone. Normal strength in all tested extremity muscles.  Sensory.: intact to touch ,pinprick .position and vibratory sensation.  Coordination: Rapid alternating movements normal in all extremities. Finger-to-nose and heel-to-shin performed accurately bilaterally. Gait and Station: Arises from chair without difficulty. Stance is normal. Gait demonstrates normal stride length with mild imbalance initially but stabilized without assistive device.   Reflexes: 1+ and symmetric. Toes downgoing.       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 ILR showed A fib 10/2020 - placed on Eliquis. Additional seizure 02/2021, restarted Keppra. 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      1.  Seizure activity likely late effect of stroke 2. Breakthrough seizure -Last seizure event 03/2022 -Continue Keppra 1500 mg nightly and topiramate - increasing to 100mg  twice daily for further headache prevention -EEG 09/07/2021 normal -Discussed seizure provoking triggers and importance of avoidance -advised to call with any seizure activity    3. Vascular headaches -Increase topiramate to 100 mg twice daily -Continue amitriptyline to 50 mg nightly  -start Nurtec as needed for migraine rescue. Okay to use tylenol as needed but advised to refrain from extra dose  of aspirin or ibuprofen for symptom management due to current use of Eliquis and daily aspirin dosage -advised to call with any worsening of headaches or proceed to ED/call 911 with any severe headache or associated with red flag signs/symptoms   4.  Right MCA stroke 5. Hx of L MCA stroke -Episode of dysarthria and confusion last week lasting for about 30 minutes. Will complete MRI brain to rule out vascular etiology.  -obtain CMP and lipid panel today -continue Eliquis 5 mg twice daily, asa 81mg  daily and atorvastatin 80 mg daily for secondary stroke prevention measures  -s/p MCA stent followed by IR -Continue to follow with cardiology as scheduled in routine follow-up with PCP -maintain strict control of hypertension with blood pressure goal below 130/90 and lipids with LDL cholesterol goal below 70 mg/dL.       Follow-up in 6 months or call earlier if needed    CC:  Billie Ruddy, MD   I spent 34 minutes of face-to-face and non-face-to-face time with patient and daughter.  This included previsit chart review, lab review, study review, order entry, electronic health record documentation, patient and daughter education and discussion regarding above diagnoses and answered all other questions to patient and daughters satisfaction   Frann Rider, [facility]  [facility] [address] [place], [place] [address]  Phone [phone] Fax [phone] Note: This document was prepared with digital dictation and possible smart phrase technology. Any transcriptional errors that result from this process are unintentional.

## 2022-08-23 NOTE — Telephone Encounter (Signed)
[employer] medicare NPR sent to [facility] [phone] 

## 2022-08-23 NOTE — Patient Instructions (Addendum)
Your Plan:  Increase topamax to 100mg  twice daily for headaches  Recommend trying Nurtec 75mg  as needed for onset of migraine headaches  You will be called to schedule an MRI brain to look for possible stroke after episode of dysarthria   We will check lab work today - we will let you know how these look on Monday after they reviewed   Continue Eliquis, aspirin and atorvastatin for secondary stroke prevention      Follow up in 6 months or call earlier if needed       Thank you for coming to see [place] at [facility]. I hope we have been able to provide you high quality care today.  You may receive a patient satisfaction survey over the next few weeks. We would appreciate your feedback and comments so that we may continue to improve ourselves and the health of our patients.

## 2022-08-24 LAB — COMPREHENSIVE METABOLIC PANEL
ALT: 167 IU/L — ABNORMAL HIGH (ref 0–32)
AST: 173 IU/L — ABNORMAL HIGH (ref 0–40)
Albumin/Globulin Ratio: 1.5 (ref 1.2–2.2)
Albumin: 4.1 g/dL (ref 3.9–4.9)
Alkaline Phosphatase: 192 IU/L — ABNORMAL HIGH (ref 44–121)
BUN/Creatinine Ratio: 13 (ref 12–28)
BUN: 20 mg/dL (ref 8–27)
Bilirubin Total: 0.4 mg/dL (ref 0.0–1.2)
CO2: 18 mmol/L — ABNORMAL LOW (ref 20–29)
Calcium: 9.3 mg/dL (ref 8.7–10.3)
Chloride: 109 mmol/L — ABNORMAL HIGH (ref 96–106)
Creatinine, Ser: 1.55 mg/dL — ABNORMAL HIGH (ref 0.57–1.00)
Globulin, Total: 2.7 g/dL (ref 1.5–4.5)
Glucose: 91 mg/dL (ref 70–99)
Potassium: 4.9 mmol/L (ref 3.5–5.2)
Sodium: 140 mmol/L (ref 134–144)
Total Protein: 6.8 g/dL (ref 6.0–8.5)
eGFR: 36 mL/min/{1.73_m2} — ABNORMAL LOW (ref >59)

## 2022-08-24 LAB — LIPID PANEL
Chol/HDL Ratio: 1.6 ratio (ref 0.0–4.4)
Cholesterol, Total: 111 mg/dL (ref 100–199)
HDL: 70 mg/dL (ref >39)
LDL Chol Calc (NIH): 26 mg/dL (ref 0–99)
Triglycerides: 71 mg/dL (ref 0–149)
VLDL Cholesterol Cal: 15 mg/dL (ref 5–40)

## 2022-09-03 ENCOUNTER — Telehealth: Payer: Self-pay

## 2022-09-03 NOTE — Telephone Encounter (Signed)
PA was sent to [facility]/Nurtec Key: [facility] Waiting for response.

## 2022-09-04 NOTE — Telephone Encounter (Signed)
Patient was Approve: Request Reference Number: [id number]. NURTEC TAB 75MG  ODT is approved through 06/04/2023.

## 2022-09-10 ENCOUNTER — Ambulatory Visit: Payer: Medicare Other | Attending: Cardiology | Admitting: Cardiology

## 2022-09-20 ENCOUNTER — Other Ambulatory Visit: Payer: Self-pay | Admitting: Adult Health

## 2022-09-24 ENCOUNTER — Ambulatory Visit: Payer: Medicare Other

## 2022-10-01 ENCOUNTER — Ambulatory Visit (INDEPENDENT_AMBULATORY_CARE_PROVIDER_SITE_OTHER): Payer: Medicare Other

## 2022-10-01 ENCOUNTER — Encounter: Payer: Self-pay | Admitting: Family Medicine

## 2022-10-01 ENCOUNTER — Ambulatory Visit (INDEPENDENT_AMBULATORY_CARE_PROVIDER_SITE_OTHER): Payer: Medicare Other | Admitting: Family Medicine

## 2022-10-01 VITALS — BP 110/72 | HR 77 | Temp 98.5°F | Wt 178.8 lb

## 2022-10-01 DIAGNOSIS — I639 Cerebral infarction, unspecified: Secondary | ICD-10-CM | POA: Diagnosis not present

## 2022-10-01 DIAGNOSIS — N1831 Chronic kidney disease, stage 3a: Secondary | ICD-10-CM

## 2022-10-01 DIAGNOSIS — R7989 Other specified abnormal findings of blood chemistry: Secondary | ICD-10-CM

## 2022-10-01 DIAGNOSIS — R8290 Unspecified abnormal findings in urine: Secondary | ICD-10-CM

## 2022-10-01 DIAGNOSIS — E785 Hyperlipidemia, unspecified: Secondary | ICD-10-CM

## 2022-10-01 LAB — POCT URINALYSIS DIPSTICK
Bilirubin, UA: NEGATIVE
Blood, UA: NEGATIVE
Glucose, UA: NEGATIVE
Ketones, UA: NEGATIVE
Nitrite, UA: NEGATIVE
Protein, UA: POSITIVE — AB
Spec Grav, UA: 1.02 (ref 1.010–1.025)
Urobilinogen, UA: NEGATIVE E.U./dL — AB
pH, UA: 6 (ref 5.0–8.0)

## 2022-10-01 LAB — CUP PACEART REMOTE DEVICE CHECK
Date Time Interrogation Session: 20240427230417
Implantable Pulse Generator Implant Date: 20220407

## 2022-10-01 MED ORDER — ATORVASTATIN CALCIUM 80 MG PO TABS: 80.0000 mg | ORAL_TABLET | Freq: Every day | ORAL | 3 refills | Status: DC

## 2022-10-01 NOTE — Progress Notes (Signed)
Established Patient Office Visit   Subjective  Patient ID: Courtney Burke, female    DOB: Jun 16, 1955  Age: 67 y.o. MRN: [id number]  Chief Complaint  Patient presents with   Follow-up    Micah Flesher to neurologist and had blood work. They want her to see PCP.   Patient accompanied by her daughter.  Patient is a 67 year old female with pmh sig for DM II, HTN, history of CVA with aphasia, paroxysmal A-fib, seizure disorder, CKD 3a, obesity s/p sleeve gastrectomy, HLD who is seen for follow-up.  Per patient's daughter her neurologist advised her to see PCP regarding kidney function.  Pt reports seeing deceased family members.  Denies dysuria, fever, chills.  Pt concerned about her weight.      ROS Negative unless stated above    Objective:     BP 110/72 (BP Location: Left Arm, Patient Position: Sitting, Cuff Size: Normal)   Pulse 77   Temp 98.5 F (36.9 C) (Oral)   Wt 178 lb 12.8 oz (81.1 kg)   LMP 09/28/2012   SpO2 93%   BMI 32.70 kg/m    Physical Exam Constitutional:      General: She is not in acute distress.    Appearance: Normal appearance.  HENT:     Head: Normocephalic and atraumatic.     Nose: Nose normal.     Mouth/Throat:     Mouth: Mucous membranes are moist.  Cardiovascular:     Rate and Rhythm: Normal rate and regular rhythm.     Heart sounds: Normal heart sounds. No murmur heard.    No gallop.  Pulmonary:     Effort: Pulmonary effort is normal. No respiratory distress.     Breath sounds: Normal breath sounds. No wheezing, rhonchi or rales.  Skin:    General: Skin is warm and dry.  Neurological:     Mental Status: She is alert and oriented to person, place, and time.      Results for orders placed or performed in visit on 10/01/22  POCT urinalysis dipstick  Result Value Ref Range   Color, UA LIGHT YELLOW    Clarity, UA clear    Glucose, UA Negative Negative   Bilirubin, UA neg    Ketones, UA neg    Spec Grav, UA 1.020 1.010 - 1.025    Blood, UA neg    pH, UA 6.0 5.0 - 8.0   Protein, UA Positive (A) Negative   Urobilinogen, UA negative (A) 0.2 or 1.0 E.U./dL   Nitrite, UA neg    Leukocytes, UA Moderate (2+) (A) Negative   Appearance     Odor    Results for orders placed or performed in visit on 10/01/22  CUP PACEART REMOTE DEVICE CHECK  Result Value Ref Range   Date Time Interrogation Session [id number]    Pulse Generator Manufacturer MERM    Pulse Gen Model LNQ22 LINQ II    Pulse Gen Serial Number [id number]    Clinic Name [facility]    Implantable Pulse Generator Type ICM/ILR    Implantable Pulse Generator Implant Date [id number]       Assessment & Plan:  Stage 3a chronic kidney disease (HCC) -creatinine baseline 1.21-1.5.  eGFR 30s-40s -referral to Nephrology based on results. -     Comprehensive metabolic panel -     CBC with Differential/Platelet -     VITAMIN D 25 Hydroxy (Vit-D Deficiency, Fractures) -     POCT urinalysis dipstick  Abnormal urinalysis -UA with 2+ leuks,  1+ protein -obtain Ucx to r/o UTI -     Urine Culture  Elevated LFTs -AST 173, ALT 167, alk phos 192 on 08/23/2022 -recheck.  RUQ [place] if needed. -     Comprehensive metabolic panel   No follow-ups on file.   Deeann Saint, MD

## 2022-10-02 LAB — CBC WITH DIFFERENTIAL/PLATELET
Basophils Absolute: 0 10*3/uL (ref 0.0–0.1)
Basophils Relative: 0.8 % (ref 0.0–3.0)
Eosinophils Absolute: 0.4 10*3/uL (ref 0.0–0.7)
Eosinophils Relative: 8.7 % — ABNORMAL HIGH (ref 0.0–5.0)
HCT: 34.4 % — ABNORMAL LOW (ref 36.0–46.0)
Hemoglobin: 11.3 g/dL — ABNORMAL LOW (ref 12.0–15.0)
Lymphocytes Relative: 36.6 % (ref 12.0–46.0)
Lymphs Abs: 1.8 10*3/uL (ref 0.7–4.0)
MCHC: 32.8 g/dL (ref 30.0–36.0)
MCV: 86.9 fl (ref 78.0–100.0)
Monocytes Absolute: 0.3 10*3/uL (ref 0.1–1.0)
Monocytes Relative: 6.8 % (ref 3.0–12.0)
Neutro Abs: 2.4 10*3/uL (ref 1.4–7.7)
Neutrophils Relative %: 47.1 % (ref 43.0–77.0)
Platelets: 351 10*3/uL (ref 150.0–400.0)
RBC: 3.96 Mil/uL (ref 3.87–5.11)
RDW: 13.7 % (ref 11.5–15.5)
WBC: 5 10*3/uL (ref 4.0–10.5)

## 2022-10-02 LAB — COMPREHENSIVE METABOLIC PANEL
ALT: 72 U/L — ABNORMAL HIGH (ref 0–35)
AST: 74 U/L — ABNORMAL HIGH (ref 0–37)
Albumin: 3.8 g/dL (ref 3.5–5.2)
Alkaline Phosphatase: 129 U/L — ABNORMAL HIGH (ref 39–117)
BUN: 20 mg/dL (ref 6–23)
CO2: 20 mEq/L (ref 19–32)
Calcium: 8.7 mg/dL (ref 8.4–10.5)
Chloride: 111 mEq/L (ref 96–112)
Creatinine, Ser: 1.81 mg/dL — ABNORMAL HIGH (ref 0.40–1.20)
GFR: 28.47 mL/min — ABNORMAL LOW (ref >60.00)
Glucose, Bld: 77 mg/dL (ref 70–99)
Potassium: 4.1 mEq/L (ref 3.5–5.1)
Sodium: 138 mEq/L (ref 135–145)
Total Bilirubin: 0.3 mg/dL (ref 0.2–1.2)
Total Protein: 6.8 g/dL (ref 6.0–8.3)

## 2022-10-02 LAB — VITAMIN D 25 HYDROXY (VIT D DEFICIENCY, FRACTURES): VITD: 36.64 ng/mL (ref 30.00–100.00)

## 2022-10-03 LAB — URINE CULTURE
MICRO NUMBER:: 14892979
Result:: NO GROWTH
SPECIMEN QUALITY:: ADEQUATE

## 2022-10-04 ENCOUNTER — Other Ambulatory Visit: Payer: Self-pay | Admitting: Family Medicine

## 2022-10-04 DIAGNOSIS — N184 Chronic kidney disease, stage 4 (severe): Secondary | ICD-10-CM

## 2022-11-01 NOTE — Progress Notes (Signed)
Carelink Summary Report / Loop Recorder 

## 2022-11-05 ENCOUNTER — Ambulatory Visit (INDEPENDENT_AMBULATORY_CARE_PROVIDER_SITE_OTHER): Payer: Medicare Other

## 2022-11-05 DIAGNOSIS — I639 Cerebral infarction, unspecified: Secondary | ICD-10-CM | POA: Diagnosis not present

## 2022-11-05 LAB — CUP PACEART REMOTE DEVICE CHECK
Date Time Interrogation Session: 20240602231206
Implantable Pulse Generator Implant Date: 20220407

## 2022-11-22 ENCOUNTER — Other Ambulatory Visit: Payer: Self-pay | Admitting: Adult Health

## 2022-11-22 DIAGNOSIS — G40919 Epilepsy, unspecified, intractable, without status epilepticus: Secondary | ICD-10-CM

## 2022-11-22 DIAGNOSIS — I69398 Other sequelae of cerebral infarction: Secondary | ICD-10-CM

## 2022-11-27 NOTE — Progress Notes (Signed)
Carelink Summary Report / Loop Recorder 

## 2022-12-10 ENCOUNTER — Ambulatory Visit (INDEPENDENT_AMBULATORY_CARE_PROVIDER_SITE_OTHER): Payer: Medicare Other

## 2022-12-10 DIAGNOSIS — I639 Cerebral infarction, unspecified: Secondary | ICD-10-CM

## 2022-12-10 LAB — CUP PACEART REMOTE DEVICE CHECK
Date Time Interrogation Session: 20240705230510
Implantable Pulse Generator Implant Date: 20220407

## 2022-12-24 ENCOUNTER — Ambulatory Visit: Payer: Medicare Other

## 2022-12-25 NOTE — Progress Notes (Signed)
Carelink Summary Report / Loop Recorder 

## 2023-01-13 ENCOUNTER — Other Ambulatory Visit (HOSPITAL_COMMUNITY): Payer: Self-pay | Admitting: Cardiology

## 2023-01-13 ENCOUNTER — Other Ambulatory Visit: Payer: Self-pay | Admitting: Adult Health

## 2023-01-13 ENCOUNTER — Other Ambulatory Visit: Payer: Self-pay | Admitting: Diagnostic Neuroimaging

## 2023-01-13 DIAGNOSIS — I480 Paroxysmal atrial fibrillation: Secondary | ICD-10-CM

## 2023-01-14 ENCOUNTER — Ambulatory Visit (INDEPENDENT_AMBULATORY_CARE_PROVIDER_SITE_OTHER): Payer: Medicare Other

## 2023-01-14 DIAGNOSIS — I639 Cerebral infarction, unspecified: Secondary | ICD-10-CM | POA: Diagnosis not present

## 2023-01-14 NOTE — Telephone Encounter (Signed)
Prescription refill request for Eliquis received. Indication:afib Last office visit:needs appt Scr:1.81  4/24 Age: 67 Weight:81.1  kg  Prescription refilled

## 2023-01-28 NOTE — Progress Notes (Signed)
Carelink Summary Report / Loop Recorder 

## 2023-02-13 ENCOUNTER — Ambulatory Visit (INDEPENDENT_AMBULATORY_CARE_PROVIDER_SITE_OTHER): Payer: Medicare Other | Admitting: Family Medicine

## 2023-02-13 ENCOUNTER — Encounter: Payer: Self-pay | Admitting: Family Medicine

## 2023-02-13 VITALS — BP 126/72 | HR 83 | Temp 98.7°F | Ht 62.0 in | Wt 189.4 lb

## 2023-02-13 DIAGNOSIS — Z23 Encounter for immunization: Secondary | ICD-10-CM | POA: Diagnosis not present

## 2023-02-13 DIAGNOSIS — E1169 Type 2 diabetes mellitus with other specified complication: Secondary | ICD-10-CM | POA: Diagnosis not present

## 2023-02-13 DIAGNOSIS — Z8673 Personal history of transient ischemic attack (TIA), and cerebral infarction without residual deficits: Secondary | ICD-10-CM

## 2023-02-13 DIAGNOSIS — R4586 Emotional lability: Secondary | ICD-10-CM | POA: Diagnosis not present

## 2023-02-13 DIAGNOSIS — I10 Essential (primary) hypertension: Secondary | ICD-10-CM | POA: Diagnosis not present

## 2023-02-13 MED ORDER — SERTRALINE HCL 25 MG PO TABS
25.0000 mg | ORAL_TABLET | Freq: Every day | ORAL | 1 refills | Status: DC
Start: 2023-02-13 — End: 2023-09-16

## 2023-02-13 NOTE — Progress Notes (Signed)
Established Patient Office Visit   Subjective  Patient ID: Courtney Burke, female    DOB: December 28, 1955  Age: 67 y.o. MRN: [id number]  Chief Complaint  Patient presents with   Medical Management of Chronic Issues    residuals of stroke, mood swings  Pt obtained by her brother.  Patient's daughter and granddaughter also present in clinic at their own appointments.  Patient is a 67 year old female seen for follow-up.  Patient with history of residual aphasia status post CVA.  Family expresses concerns about patient's mood.  Patient recently became more angry, argumentative towards her daughter and granddaughter.  Patient acknowledges she has been upset lately but does not go into detail.  Patient previously on Zoloft which seemed to help symptoms.    Past Medical History:  Diagnosis Date   Acute ischemic left MCA stroke (HCC) 06/30/2020   Acute ischemic stroke (HCC)    Acute stroke due to occlusion of left middle cerebral artery (HCC) 06/30/2020   Age-related nuclear cataract of both eyes 09/28/2015   Arthralgia of multiple joints 09/28/2015   Asthma    Borderline diabetes    Cerebrovascular accident (CVA) (HCC)    Chronic bilateral thoracic back pain 06/05/2018   Chronic renal insufficiency, stage 1 08/11/2014   Diabetes mellitus type 2 in obese    Dyslipidemia    Dysphagia, post-stroke    Erosive gastritis 07/20/2019   Formatting of this note might be different from the original. On EGD 07/2019   Esophagitis 07/20/2019   Formatting of this note might be different from the original. Added automatically from request for surgery [id number]  Formatting of this note might be different from the original. On EGD 07/2019   Essential hypertension 09/28/2015   Essential thrombocytosis (HCC) 09/28/2015   Gallstones 08/18/2019   Formatting of this note might be different from the original. Added automatically from request for surgery [id number]   Gastroesophageal reflux disease without  esophagitis 09/28/2015   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   Hyperlipidemia, unspecified 03/16/2013   Hypertension    Keratoconjunctivitis sicca of both eyes not specified as Sjogren's 09/28/2015   Left middle cerebral artery stroke (HCC) 07/09/2020   Morbid obesity (HCC) 05/21/2013   Seizures (HCC)    Stage 3b chronic kidney disease (HCC)    Status post gastric bypass for obesity 09/28/2015   Past Surgical History:  Procedure Laterality Date   ANKLE SURGERY     CARPAL TUNNEL RELEASE     carpel tunnel     ESOPHAGOGASTRODUODENOSCOPY (EGD) WITH PROPOFOL N/A 11/14/2021   Procedure: ESOPHAGOGASTRODUODENOSCOPY (EGD) WITH PROPOFOL;  Surgeon: Iva Boop, MD;  [facility];  Service: Gastroenterology;  Laterality: N/A;   IR ANGIO INTRA EXTRACRAN SEL COM CAROTID INNOMINATE BILAT MOD SED  01/17/2021   IR ANGIO VERTEBRAL SEL SUBCLAVIAN INNOMINATE UNI R MOD SED  01/17/2021   IR CT HEAD LTD  06/30/2020   IR INTRA CRAN STENT  06/30/2020   IR PERCUTANEOUS ART THROMBECTOMY/INFUSION INTRACRANIAL INC DIAG ANGIO  06/30/2020   IR RADIOLOGIST EVAL & MGMT  08/26/2020   IR US GUIDE VASC ACCESS RIGHT  01/17/2021   RADIOLOGY WITH ANESTHESIA N/A 06/30/2020   Procedure: RADIOLOGY WITH ANESTHESIA;  Surgeon: Radiologist, Medication, MD;  Location: [facility] OR;  Service: Radiology;  Laterality: N/A;   Social History   Tobacco Use   Smoking status: Never   Smokeless tobacco: Never  Vaping Use   Vaping status: Never Used  Substance Use Topics   Alcohol use: No  Drug use: No   Family History  Problem Relation Age of Onset   Stroke Maternal Grandfather    Allergies  Allergen Reactions   Homatropine Itching   Iodinated Contrast Media Itching and Other (See Comments)   Doxycycline Nausea And Vomiting and Other (See Comments)   Sulfa Antibiotics Rash and Hives   Codeine Hives and Swelling    Swollen tongue   Hydrocodone Itching   Hydrocodone Bit-Homatrop Mbr Itching   Sulfamethoxazole Hives    Ace Inhibitors Cough, Itching and Rash      ROS Negative unless stated above    Objective:     BP 126/72 (BP Location: Right Arm, Patient Position: Sitting, Cuff Size: Large)   Pulse 83   Temp 98.7 F (37.1 C) (Oral)   Ht 5\' 2"  (1.575 m)   Wt 189 lb 6.4 oz (85.9 kg)   LMP 09/28/2012   SpO2 97%   BMI 34.64 kg/m  BP Readings from Last 3 Encounters:  02/13/23 126/72  10/01/22 110/72  08/23/22 121/78   Wt Readings from Last 3 Encounters:  02/13/23 189 lb 6.4 oz (85.9 kg)  10/01/22 178 lb 12.8 oz (81.1 kg)  08/23/22 177 lb (80.3 kg)      Physical Exam Constitutional:      General: She is not in acute distress.    Appearance: Normal appearance.  HENT:     Head: Normocephalic and atraumatic.     Nose: Nose normal.     Mouth/Throat:     Mouth: Mucous membranes are moist.  Cardiovascular:     Rate and Rhythm: Normal rate and regular rhythm.     Heart sounds: Normal heart sounds. No murmur heard.    No gallop.  Pulmonary:     Effort: Pulmonary effort is normal. No respiratory distress.     Breath sounds: Normal breath sounds. No wheezing, rhonchi or rales.  Skin:    General: Skin is warm and dry.  Neurological:     Mental Status: She is alert and oriented to person, place, and time.     Comments: Aphasia.  Every 2-3 words intelligible.       02/13/2023    4:01 PM 06/29/2022    2:47 PM 03/28/2022   10:17 AM  Depression screen PHQ 2/9  Decreased Interest 0 0 1  Down, Depressed, Hopeless 2 0 1  PHQ - 2 Score 2 0 2  Altered sleeping 2  1  Tired, decreased energy 1  1  Change in appetite 0  0  Feeling bad or failure about yourself  1  0  Trouble concentrating 0  1  Moving slowly or fidgety/restless 1  1  Suicidal thoughts 0  0  PHQ-9 Score 7  6  Difficult doing work/chores Somewhat difficult  Not difficult at all      02/13/2023    4:02 PM  GAD 7 : Generalized Anxiety Score  Nervous, Anxious, on Edge 2  Control/stop worrying 1  Worry too much -  different things 0  Trouble relaxing 0  Restless 1  Easily annoyed or irritable 1  Afraid - awful might happen 1  Total GAD 7 Score 6  Anxiety Difficulty Somewhat difficult      No results found for any visits on 02/13/23.    Assessment & Plan:  History of CVA (cerebrovascular accident) -     Sertraline HCl; Take 1 tablet (25 mg total) by mouth daily.  Dispense: 90 tablet; Refill: 1  Essential hypertension -  Comprehensive metabolic panel -     CBC with Differential/Platelet -     TSH  Mood changes -     Sertraline HCl; Take 1 tablet (25 mg total) by mouth daily.  Dispense: 90 tablet; Refill: 1 -     VITAMIN D 25 Hydroxy (Vit-D Deficiency, Fractures) -     Vitamin B12  Type 2 diabetes mellitus with other specified complication, without long-term current use of insulin (HCC) -     POCT glycosylated hemoglobin (Hb A1C) -     CBC with Differential/Platelet  Need for influenza vaccination -     Flu Vaccine Trivalent High Dose (Fluad)  Patient seen for follow-up and concerns regarding behavior.  Requesting to restart Zoloft.  Given precautions is also taking Topamax and amitriptyline.  BP well-controlled.  Continue to monitor.  Continue Eliquis and Lipitor.  Hemoglobin A1c 5.3% this visit.  Influenza vaccine given this visit.  Return in about 3 months (around 05/15/2023).   Deeann Saint, MD

## 2023-02-14 LAB — COMPREHENSIVE METABOLIC PANEL
ALT: 18 U/L (ref 0–35)
AST: 30 U/L (ref 0–37)
Albumin: 3.9 g/dL (ref 3.5–5.2)
Alkaline Phosphatase: 140 U/L — ABNORMAL HIGH (ref 39–117)
BUN: 13 mg/dL (ref 6–23)
CO2: 19 meq/L (ref 19–32)
Calcium: 9 mg/dL (ref 8.4–10.5)
Chloride: 112 meq/L (ref 96–112)
Creatinine, Ser: 1.49 mg/dL — ABNORMAL HIGH (ref 0.40–1.20)
GFR: 35.86 mL/min — ABNORMAL LOW (ref >60.00)
Glucose, Bld: 82 mg/dL (ref 70–99)
Potassium: 4 meq/L (ref 3.5–5.1)
Sodium: 141 meq/L (ref 135–145)
Total Bilirubin: 0.4 mg/dL (ref 0.2–1.2)
Total Protein: 7 g/dL (ref 6.0–8.3)

## 2023-02-14 LAB — CBC WITH DIFFERENTIAL/PLATELET
Basophils Absolute: 0.1 10*3/uL (ref 0.0–0.1)
Basophils Relative: 1.2 % (ref 0.0–3.0)
Eosinophils Absolute: 0.2 10*3/uL (ref 0.0–0.7)
Eosinophils Relative: 2.8 % (ref 0.0–5.0)
HCT: 38 % (ref 36.0–46.0)
Hemoglobin: 12.3 g/dL (ref 12.0–15.0)
Lymphocytes Relative: 32.9 % (ref 12.0–46.0)
Lymphs Abs: 2.1 10*3/uL (ref 0.7–4.0)
MCHC: 32.4 g/dL (ref 30.0–36.0)
MCV: 87.6 fl (ref 78.0–100.0)
Monocytes Absolute: 0.4 10*3/uL (ref 0.1–1.0)
Monocytes Relative: 6.1 % (ref 3.0–12.0)
Neutro Abs: 3.6 10*3/uL (ref 1.4–7.7)
Neutrophils Relative %: 57 % (ref 43.0–77.0)
Platelets: 420 10*3/uL — ABNORMAL HIGH (ref 150.0–400.0)
RBC: 4.33 Mil/uL (ref 3.87–5.11)
RDW: 14.1 % (ref 11.5–15.5)
WBC: 6.3 10*3/uL (ref 4.0–10.5)

## 2023-02-14 LAB — VITAMIN D 25 HYDROXY (VIT D DEFICIENCY, FRACTURES): VITD: 59 ng/mL (ref 30.00–100.00)

## 2023-02-14 LAB — VITAMIN B12: Vitamin B-12: 649 pg/mL (ref 211–911)

## 2023-02-14 LAB — TSH: TSH: 1.4 u[IU]/mL (ref 0.35–5.50)

## 2023-02-15 ENCOUNTER — Encounter: Payer: Self-pay | Admitting: Family Medicine

## 2023-02-15 LAB — POCT GLYCOSYLATED HEMOGLOBIN (HGB A1C): HbA1c, POC (controlled diabetic range): 5.3 % (ref 0.0–7.0)

## 2023-02-18 ENCOUNTER — Ambulatory Visit (INDEPENDENT_AMBULATORY_CARE_PROVIDER_SITE_OTHER): Payer: Medicare Other

## 2023-02-18 DIAGNOSIS — I639 Cerebral infarction, unspecified: Secondary | ICD-10-CM

## 2023-02-19 LAB — CUP PACEART REMOTE DEVICE CHECK
Date Time Interrogation Session: 20240913230610
Implantable Pulse Generator Implant Date: 20220407

## 2023-02-25 ENCOUNTER — Other Ambulatory Visit: Payer: Self-pay | Admitting: Adult Health

## 2023-02-25 DIAGNOSIS — G40919 Epilepsy, unspecified, intractable, without status epilepticus: Secondary | ICD-10-CM

## 2023-02-25 DIAGNOSIS — R569 Unspecified convulsions: Secondary | ICD-10-CM

## 2023-02-27 ENCOUNTER — Other Ambulatory Visit (HOSPITAL_COMMUNITY): Payer: Self-pay | Admitting: Cardiology

## 2023-02-27 DIAGNOSIS — I480 Paroxysmal atrial fibrillation: Secondary | ICD-10-CM

## 2023-02-27 NOTE — Telephone Encounter (Signed)
Prescription refill request for Eliquis received. Indication: Afib  Last office visit:09/28/21 ([place])  Scr: 1.49 (02/13/23)  Age: 67 Weight: 85.9kg  Office visit overdue. Called and spoke with pt' daughter, made her aware of overdue visit. Transferred to scheduling.

## 2023-02-27 NOTE — Telephone Encounter (Signed)
Pt has scheduled appt with Keitha Butte, PA on 04/09/23. Refill sent to prevent any missed doses.

## 2023-03-04 NOTE — Progress Notes (Signed)
Carelink Summary Report / Loop Recorder 

## 2023-03-06 ENCOUNTER — Ambulatory Visit (INDEPENDENT_AMBULATORY_CARE_PROVIDER_SITE_OTHER): Payer: Medicare Other | Admitting: Adult Health

## 2023-03-06 ENCOUNTER — Encounter: Payer: Self-pay | Admitting: Adult Health

## 2023-03-06 VITALS — BP 123/73 | Ht 62.0 in | Wt 189.0 lb

## 2023-03-06 DIAGNOSIS — R410 Disorientation, unspecified: Secondary | ICD-10-CM | POA: Diagnosis not present

## 2023-03-06 DIAGNOSIS — I63411 Cerebral infarction due to embolism of right middle cerebral artery: Secondary | ICD-10-CM | POA: Diagnosis not present

## 2023-03-06 DIAGNOSIS — I69398 Other sequelae of cerebral infarction: Secondary | ICD-10-CM | POA: Diagnosis not present

## 2023-03-06 DIAGNOSIS — G40919 Epilepsy, unspecified, intractable, without status epilepticus: Secondary | ICD-10-CM

## 2023-03-06 DIAGNOSIS — G441 Vascular headache, not elsewhere classified: Secondary | ICD-10-CM | POA: Diagnosis not present

## 2023-03-06 MED ORDER — AMITRIPTYLINE HCL 50 MG PO TABS: 50.0000 mg | ORAL_TABLET | Freq: Every day | ORAL | 3 refills | Status: DC

## 2023-03-06 MED ORDER — LEVETIRACETAM 750 MG PO TABS
750.0000 mg | ORAL_TABLET | Freq: Two times a day (BID) | ORAL | 3 refills | Status: DC
Start: 2023-03-06 — End: 2024-02-13

## 2023-03-06 MED ORDER — TOPIRAMATE 100 MG PO TABS: 100.0000 mg | ORAL_TABLET | Freq: Two times a day (BID) | ORAL | 3 refills | Status: DC

## 2023-03-06 NOTE — Patient Instructions (Addendum)
Your Plan:  We will check lab work today to look for causes of more recent confusion  Will place order for MRI brain to evaluate for new stroke causing worsening confusion   You will be scheduled to repeat EEG to rule out seizure activity contributing   Continue keppra - please decrease down to 750mg  in the morning and keep 1500mg  nightly for 2 weeks then decrease to 750mg  twice daily due to kidney function and may be causing some mood changes  Continue topamax 100mg  twice daily and amitriptyline 50mg  nightly   Please call with any seizure activity - if this should occur, we will need to discuss starting another medication for seizure prevention   If confusion persists or worsens or associated with any new or worsening neurological symptoms, please call 911 immediately for emergent evaluation      Follow up in 6 months or call earlier if needed      Thank you for coming to see [place] at [facility]. I hope we have been able to provide you high quality care today.  You may receive a patient satisfaction survey over the next few weeks. We would appreciate your feedback and comments so that we may continue to improve ourselves and the health of our patients.

## 2023-03-06 NOTE — Progress Notes (Signed)
[facility] [address] [place]. [place] [address] [phone]       OFFICE FOLLOW-UP NOTE  Ms. Kristeen Miss Date of Birth:  Dec 01, 1955 Medical Record Number:  [id number]    Reason for visit: Stroke, seizure, headaches Primary neurologist: Dr. Pearlean Brownie  Chief Complaint  Patient presents with   Follow-up    Patient in room #3 with her daughter. Patient daughter states her mother has time when she communicating and then she not.     HPI:   Update 03/06/2023 JM: Patient returns for follow-up visit accompanied by her daughter. Daughter reports a fall after tripping on a stick over the weekend while she was out with her friends and has been more fatigued since, she has been staying in her bed the past several days despite family trying to discourage this. Per daughter, patient has not complained of any worsening headaches (denies hitting head with fall), no new weakness or change in speech. Upon arrival to today's visit with CMA, patient had difficulty stating her birthday (which she is normally able to do), she continued to focus on her weight after she was weighed initially. Later during visit, she was asked her name which she was able to state and then when asked her birthday, she kept repeatedly trying to say her name, she was later able to state her birthday. Per daughter, she appears more confused since arriving to visit. At one point, she started to become very emotional for unclear reason but daughter able to calm her down. After further discussion, patient restarted sertraline last week for more recent changes in mood. Has been more angry and agitated over the past several weeks.  Previously on sertraline by psychiatry but apparently they would no longer prescribe and was abruptly discontinued.  No other changes to medications, no recent illness. Reports sleeps well and good appetite although admits to no water intake, reports to drink tea during the day.    Headaches overall well controlled, occasional headaches, will resolve with Nurtec. Remains on amitriptyline 50 mg daily. No witnessed seizure activity, continues on Keppra 1500 mg twice daily and topiramate 100 mg twice daily.  Compliant on stroke prevention medications.  Routinely follows with PCP.     History provided for reference purposes only Update 08/23/2022 JM: Patient returns for routine follow-up accompanied by her daughter.  Seizure: At prior visit, reported seizure event likely unprovoked.  Reported compliance on Keppra 1500 mg twice daily.  She was started on topiramate with gradual titration to 50 mg twice daily (which can also help with reported continued headaches).  She has not had any additional seizure activity and tolerated medications well.   Headache: still having headaches but improved since starting topamax, currently having about 2-3 per week, will take extra dose of aspirin as needed for headache (in addition to daily 81mg  dosage).    Stroke: daughter reports episode last week where she was slurring her words while laying down in bed, lasted about 30 minutes. Was fatigued at that time. Was able to follow commands and answer questions but daughter believes she had slight confusion. Unsure if headache present.  No evidence of weakness or any other neurological deficits at that time. Daughter dose endorse increased stressors and unsure if this was related.  No other new stroke/[name] symptoms since that time. Continued aphasia stable.  Compliant on Eliquis, aspirin and atorvastatin.  Blood pressure well-controlled.  Routinely follows with PCP and cardiology.    Update 04/05/2022 JM: Patient returns for 90-month  follow-up accompanied by her daughter  Seizure: Currently on Keppra 1500 mg twice daily Daughter reports seizure last week, was sitting on the side of her bed folding laundry talking with her daughter and then suddenly stopped talking and leaned back into bed frame,  eyes open, was not responding to daughter, after a couple of minutes daughter splashed water on her face and patient came to.  She was drowsy for short while after.  No additional events since that time.  Denies any missed dosages of Keppra, recent illness or infection or other specific provoking factors.   Headaches: Currently on amitriptyline 50 mg nightly Reports continued headaches, some improvement initially after increasing amitriptyline dosage at last visit but more lately has been having more headaches.  Reports these occur almost daily. Will use Tylenol couple times weekly for more severe headaches. Daughter mentions she has not been sleeping well recently. Currently working with PCP regarding this.  She also complains of daytime fatigue, recently started on vitamin D supplement for vitamin D deficiency.    Stroke: Stable. No new stroke/[name] symptoms. Remains on Eliquis, aspirin and atorvastatin Blood pressure well controlled   Update 11/30/2021 JM: Patient returns for follow-up visit accompanied by her daughter.  Previously seen 3 months ago with complaints of worsening headache as well as breakthrough seizure.   Keppra dosage increased to 1500 mg twice daily, tolerating without side effects, denies any recurrent seizure activity.    Reports continued daily frontal headaches 2-3x per day, usually short lasting, takes tylenol 1000mg  of tyneol 2-3x daily which resolves headaches.  Remains on amitriptyline 25 mg nightly, denies side effects.  Stable from stroke standpoint without new stroke/[name] symptoms.  Residual speech deficit stable.  Compliant on Eliquis, and aspirin, denies side effects. Has had difficulty obtaining amlodipine and atorvastatin refill, has been out over the past 3 months, reports PCP will not refill as she did not start medications.  blood pressure today 123/77. Monitors at home and has been stable.   Was hospitalized 6/7 through 6/14 for 1 week nausea/vomiting and  intermittent abdominal pain.  No clear source identified.  Did note dehydration with elevated creatinine and anemia, hypokalemia, hypomagnesemia and hyponatremia. She has not had any additional n/v or abdominal pains.  No further concerns at this time   Update 08/28/2021 JM: Returns for acute visit due to complaints of worsening headaches.  At prior visit 2 months ago, she was started on amitriptyline for headache complaints since her stroke in 05/2021. Has been taking amitriptyline 25mg  nightly - per daughter, thought they were improving as no mention of having headaches since starting amitriptyline but at cardiology visit last week, was complaining of headache and not feeling right upon arriving to appt. She was able to ambulate back but walking slow, sat down and then stopped talking. Mouth was twisted towards the side, teeth clenched, and lost consciousness for 2-3 minutes, she did have postictal confusion but improved after a few minutes. She was not sent to ED as she recovered back to baseline within reasonable time. No additional seizures since that time. She has been complaining of a headache since her seizure but no additional headaches over the past 2 days. Denies any new/worsening speech/language difficulties, weakness, vision changes or mental status changes. Daughter does report starting on sertraline 50mg  daily by psychiatry 2 weeks ago for depression, no other medication changes. No recent illness, trauma, falls, or other potential provoking factors. She does not drink alcohol or use recreational drugs.  Reports compliance with  all medications.  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 fib after presenting with headache, worsening speech and confusion. MRA right ICA 50% stenosis and severe proximal right M2 stenosis, L M1 stent patent, no LVO.  On Eliquis twice daily and  Brilinta PTA recommended continuation as well as adding aspirin 81 mg daily.  LDL 21 on atorvastatin.  A1c 5.5.  EEG generalized slowing but no seizures - remained on keppra. Discharged 12/7. Returned on 12/12 after episode of vomiting and syncopal episode with MRI showing extension of recent R MCA stroke likely in setting of hypotension/syncope in setting of right ICA and M2 stenosis. Continued Eliquis and asa -Brilinta discontinued.  Concern of possible seizure on 12/14 with staring off and not responsive, confusion for a few minutes after therefore increased Keppra to 1g BID. LDL 21 - decreased atorvastatin to 40 mg daily. BP stabilized on IVF, orthostatics unremarkable.  Residual expressive aphasia with word salad. Therapy evals recommended OP therapies.    Today she is accompanied by her daughter, Alvy Beal. Having headaches since recent stroke. Will take tylenol with some benefit but will return. Continues to work with SLP noting improvement, family working with her on reading out loud and conversations. Per daughter, patient will push herself too much at times and try to do too much at once. Family tries to ensure she takes breaks. Denies weakness, some imbalance but overall stable. Does c/o fear of additional strokes and difficulty sleeping at night due to mind wandering. No prior issues with depression/anxiety.   Reports compliance on Eliquis 5 mg twice daily and atorvastatin 40 mg daily without side effects. Has not been taking aspirin.  Blood pressure today 151/78 and on recheck, 132/82.  Compliant on Keppra but over past week has been taking 500mg  BID due to difficulty refilling new dose (had 500mg  tabs left over from prior prescription). Was doing well on 1000mg  BID.   Of note, diagnosed with COVID-19 12/29 after presenting to ED with lightheadedness, dizziness, nausea and vomiting and possible loss of consciousness or near syncope.  Extensive work-up largely unrevealing except COVID-positive which  was felt to be contributing to her symptoms. MR brain and MRA head/neck no acute findings or changes from prior recent imaging.   No further concerns at this time     PERTINENT IMAGING  Per hospitalization 05/15/2021 CT head - Slightly increased hypodensity in the right posterior temporal lobe and lateral occipital lobe, consistent with evolution of previously noted acute infarct. MRI  Extension of the acute infarction in the right MCA territory at the right temporoparietal junction, particularly along the inferior margin of the infarction and with a few tiny punctate satellite foci additionally present. MRA 12/7 - Approximate 50% stenosis at the supraclinoid right ICA, with probable additional short-segment severe proximal right M2 stenosis.  EEG cortical dysfunction arising from left and right temporal region, no seizure   Per hospitalization 05/10/2021 CT head Old left MCA territory strokes. Newly seen loss of gray-white differentiation at the right temporoparietal junction suggesting acute infarction in that region today. No hemorrhage or mass effect MRI  1. Acute ischemic nonhemorrhagic infarct involving the right temporoccipital junction, posterior right MCA distribution.2. Chronic left MCA territory infarcts, stable from prior.3. Additional small remote right cerebellar infarct.4. Underlying age-related cerebral atrophy with mild chronic small vessel ischemic disease. MRA  head1. Negative intracranial MRA for large vessel occlusion.2. Approximate 50% stenosis  at the supraclinoid right ICA, with probable additional short-segment severe proximal right M2 stenosis.3. Vascular stent in place within the left M1 segment. Flow through the stent itself not evaluated by MRA, although patent flow seen distal to the stent.4. Short-segment severe left M2 stenosis, similar to previous.5. Wide patency of the posterior circulation MRA Neck Wide patency of both carotid artery systems and vertebral arteries  within the neck. No hemodynamically significant stenosis or other acute vascular abnormality. 2D Echo EF 55 to 60% EEG generalized slowing.  No seizures identified      Update 02/13/2021 JM: Ms. Devanney returns for acute visit in setting of recent seizure activity.  She is accompanied by her brother.  Seizure type event 02/07/2021 evaluated in ED with symptoms of headache and then tonic-clonic jerking lasting 5 to 6 minutes with residual confusion and bowel incontinence.  Initiated Keppra 500 mg twice daily. CT head unremarkable.  No additional events since discharge.  Remains on Keppra 500 mg twice daily tolerating without side effects.  Reports slight worsening of speech since seizure event.  Was working with SLP and PT prior to seizure but has not yet restarted.  Continues to use a cane and denies any recent falls.  Denies new stroke/[name] symptoms.  Remains on Eliquis and atorvastatin.  Also compliant on Brilinta per IR recommendations.  No further concerns at this time.  Update 01/04/2021 JM: Ms. Gervasio returns for 67-month stroke follow-up accompanied by her brother, Trey Paula.  Working with SLP for residual aphasia but notes ongoing improvement. Gait has also been improving working with PT - uses cane - did have a fall last month but thankfully without injury.  Denies new stroke/[name] symptoms. ILR showed evidence of atrial fibrillation on 10/05/2020 -Eliquis initiated in addition to Brilinta. ASA d/c'd.  She does have mild bruising but otherwise tolerating.  Has not yet scheduled diagnostic angiogram with Dr. Corliss Skains.  Compliant on atorvastatin without associated side effects.  Blood pressure 136/82. Monitors at home and typically stable 120s/70s.  No further concerns at this time.  Initial visit 10/05/2020 Dr. Pearlean Brownie: Ms. Hurrell is a 67 year old African-American lady seen today for initial office follow-up visit following hospital admission for stroke in January 2022.  She is accompanied by her daughter.  History  is obtained from them and review of hospital electronic medical records and I personally reviewed imaging films in PACS.  She is a 67 year old pleasant African-American lady with past medical history of diabetes, hypertension, chronic kidney disease stage IIIa, dysphagia, obesity, s/p gastric sleeve surgery in December 2021.  She was brought in as a code stroke by EMS after she developed altered mental status.  She had a witnessed episodes in which her eyes rolled back in her head twice with brief loss of consciousness.  She is subsequently found to have right-sided weakness.  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  Aspect score was 6 on admission.  Infarct core was 11 to 20 cc only.  After discussion of risk benefits with the patient's family patient was taken for emergent mechanical thrombectomy which was performed by Dr. Corliss Skains but there was underlying MCA stenosis requiring rescue MCA stenting with resultant TICI 2C revascularization.  She was admitted admitted to the intensive  care unit blood pressure tightly controlled.  She was subsequently extubated and found to have global aphasia and right hemiparesis.  MRI scan showed acute infarct involving left MCA territory of basal ganglia as well as left patchy temporal frontal and parietal lobes.  There is small area of hemorrhage noted in the left posterior temporal lobe.  2D echo showed ejection fraction of 6065%.  Lower extremity venous Dopplers negative for DVT.  LDL cholesterol 68 mg percent.  Hemoglobin A1c was 5.4.  Patient was started on aspirin and Brilinta for MCA stent.  History of dysphagia and had a feeding tube inserted.  She was transferred to inpatient rehab where she stayed for few  weeks.  She also started on Keppra for seizure prophylaxis due to her episodes of seizure-like presentation.  EEG she had shown intermittent rhythmic delta slowing in the left frontal region.  Patient has been at home since rehab.  She is to get getting physical occupational and speech therapy which is now down to 1 day a week.  She is able to speak better but still has to speak slowly and occasionally gets stuck and then some word finding difficulties.  She has noticed improvement in right-sided strength and now she can walk independently but needs a walker.  She still has some dragging of the right foot.  Her balance is poor.  However she has had no falls or injuries.  She is able to manage most activities of daily living but needs a little supervision.  She is tolerating aspirin and Brilinta well with only minor bruising and no bleeding.  Blood pressures well controlled today it is elevated in the office at 153/75.  She remains on Lipitor which is tolerating well without muscle aches and pains.    ROS:   14 system review of systems is positive for those listed in HPI and all other systems negative  PMH:  Past Medical History:  Diagnosis Date   Acute ischemic left MCA stroke (HCC) 06/30/2020   Acute ischemic stroke (HCC)    Acute stroke due to occlusion of left middle cerebral artery (HCC) 06/30/2020   Age-related nuclear cataract of both eyes 09/28/2015   Arthralgia of multiple joints 09/28/2015   Asthma    Borderline diabetes    Cerebrovascular accident (CVA) (HCC)    Chronic bilateral thoracic back pain 06/05/2018   Chronic renal insufficiency, stage 1 08/11/2014   Diabetes mellitus type 2 in obese    Dyslipidemia    Dysphagia, post-stroke    Erosive gastritis 07/20/2019   Formatting of this note might be different from the original. On EGD 07/2019   Esophagitis 07/20/2019   Formatting of this note might be different from the original. Added automatically from request for surgery [id number]   Formatting of this note might be different from the original. On EGD 07/2019   Essential hypertension 09/28/2015   Essential thrombocytosis (HCC) 09/28/2015   Gallstones 08/18/2019   Formatting of this note might be different from the original. Added automatically from request for surgery [id number]   Gastroesophageal reflux disease without esophagitis 09/28/2015   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   Hyperlipidemia, unspecified 03/16/2013   Hypertension    Keratoconjunctivitis sicca of both eyes not specified as Sjogren's 09/28/2015   Left middle cerebral artery stroke (HCC) 07/09/2020   Morbid obesity (HCC) 05/21/2013   Seizures (HCC)    Stage 3b chronic kidney disease (HCC)    Status post gastric bypass for obesity 09/28/2015    Social History:  Social History   Socioeconomic History   Marital status: Divorced    Spouse name: Not on file   Number of children: Not on file   Years of education: Not on file   Highest education level: Not on file  Occupational History   Not on file  Tobacco Use   Smoking status: Never   Smokeless tobacco: Never  Vaping Use   Vaping status: Never Used  Substance and Sexual Activity   Alcohol use: No   Drug use: No   Sexual activity: Not on file  Other Topics Concern   Not on file  Social History Narrative   Lives with daughter Alvy Beal   Right Handed   Drinks no caffeine   Retired Engineer, 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[employer] [place]    Social Determinants of Health   Financial Resource Strain: Low Risk  (06/29/2022)   Overall Financial Resource Strain (CARDIA)    Difficulty of Paying Living Expenses: Not hard at all  Food Insecurity: No Food Insecurity (06/29/2022)   Hunger Vital Sign    Worried About Running Out of Food in the Last Year: Never true    Ran Out of Food in the Last Year: Never true  Transportation Needs: No Transportation Needs (06/29/2022)   PRAPARE - Scientist, research (physical sciences) (Medical): No    Lack of Transportation (Non-Medical): No  Physical Activity: Inactive (06/29/2022)   Exercise Vital Sign    Days of Exercise per Week: 0 days    Minutes of Exercise per Session: 0 min  Stress: No Stress Concern Present (06/29/2022)   [employer] of Occupational Health - Occupational Stress Questionnaire    Feeling of Stress : Not at all  Social Connections: Moderately Integrated (06/29/2022)   Social Connection and Isolation Panel [NHANES]    Frequency of Communication with Friends and Family: More than three times a week    Frequency of Social Gatherings with Friends and Family: More than three times a week    Attends Religious Services: More than 4 times per year    Active Member of [employer] or Organizations: Yes    Attends Engineer, structural: More than 4 times per year    Marital Status: Divorced  Intimate Partner Violence: Not At Risk (06/29/2022)   Humiliation, Afraid, Rape, and Kick questionnaire    Fear of Current or Ex-Partner: No    Emotionally Abused: No    Physically Abused: No    Sexually Abused: No    Medications:   Current Outpatient Medications on File Prior to Visit  Medication Sig Dispense Refill   acetaminophen (TYLENOL) 500 MG tablet Take 1,000 mg by mouth every 6 (six) hours as needed for moderate pain or headache.     amitriptyline (ELAVIL) 50 MG tablet TAKE 1 TABLET BY MOUTH EVERYDAY AT BEDTIME 90 tablet 1   amLODipine (NORVASC) 10 MG tablet Take 1 tablet by mouth daily.     apixaban (ELIQUIS) 5 MG TABS tablet Take 1 tablet (5 mg total) by mouth 2 (two) times daily. 60 tablet 2   aspirin EC 81 MG tablet Take 1 tablet (81 mg total) by mouth daily. Swallow whole.     atorvastatin (LIPITOR) 80 MG tablet Take 1 tablet (80 mg total) by mouth daily. 90 tablet 3   cholecalciferol (VITAMIN D3) 25 MCG (1000 UNIT) tablet Take by mouth.     KLOR-CON M20 20 MEQ tablet Take 40 mEq  by mouth 2 (two) times daily.     levETIRAcetam  (KEPPRA) 750 MG tablet TAKE 2 TABLETS (1,500 MG TOTAL) BY MOUTH 2 (TWO) TIMES DAILY. 360 tablet 1   Multiple Vitamin (MULTIVITAMIN WITH MINERALS) TABS tablet Take 1 tablet by mouth daily.     omeprazole (PRILOSEC) 40 MG capsule Take 1 tablet by mouth daily.     ondansetron (ZOFRAN-ODT) 4 MG disintegrating tablet Take by mouth.     pantoprazole (PROTONIX) 40 MG tablet Take 40 mg by mouth daily.     Rimegepant Sulfate (NURTEC) 75 MG TBDP Take 1 tablet (75 mg total) by mouth as needed. 16 tablet 5   sertraline (ZOLOFT) 25 MG tablet Take 1 tablet (25 mg total) by mouth daily. 90 tablet 1   ticagrelor (BRILINTA) 90 MG TABS tablet      topiramate (TOPAMAX) 100 MG tablet Take 1 tablet (100 mg total) by mouth 2 (two) times daily. Take 1 tablet twice daily 180 tablet 1   ursodiol (ACTIGALL) 250 MG tablet Take 250 mg by mouth 2 (two) times daily.     valsartan (DIOVAN) 160 MG tablet Take by mouth.     Vitamin D, Ergocalciferol, (DRISDOL) 1.25 MG (50000 UNIT) CAPS capsule Take 1 capsule (50,000 Units total) by mouth every 7 (seven) days. 12 capsule 0   metoCLOPramide (REGLAN) 5 MG tablet Take 1 tablet (5 mg total) by mouth every 8 (eight) hours as needed for up to 10 days for nausea or vomiting. 30 tablet 0   No current facility-administered medications on file prior to visit.    Allergies:   Allergies  Allergen Reactions   Homatropine Itching   Iodinated Contrast Media Itching and Other (See Comments)   Doxycycline Nausea And Vomiting and Other (See Comments)   Sulfa Antibiotics Rash and Hives   Codeine Hives and Swelling    Swollen tongue   Hydrocodone Itching   Hydrocodone Bit-Homatrop Mbr Itching   Sulfamethoxazole Hives   Ace Inhibitors Cough, Itching and Rash    Physical Exam Today's Vitals   03/06/23 1042  BP: 123/73  Weight: 189 lb (85.7 kg)  Height: 5\' 2"  (1.575 m)   Body mass index is 34.57 kg/m.   General: well developed, well nourished very pleasant middle-aged  African-American lady, seated, in no evident distress Head: head normocephalic and atraumatic.  Neck: supple with no carotid or supraclavicular bruits Cardiovascular: regular rate and rhythm, no murmurs Musculoskeletal: no deformity Skin:  no rash/petichiae Vascular:  Normal pulses all extremities  Neurologic Exam Mental Status: Awake and fully alert.  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  No evidence of dysarthria.  Able to follow simple step commands without difficulty. Difficulty answering a couple questions in a row, would constantly repeat answer to prior question. Recent memory impaired and remote memory intact. Attention span, concentration and fund of knowledge impaired during visit. Mood and affect appropriate.   Cranial Nerves: Pupils equal, briskly reactive to light. Extraocular movements full without nystagmus. Visual fields full to confrontation. Hearing intact. Facial sensation intact.  Face, tongue, palate moves normally and symmetrically.  Motor: Normal bulk and tone. Normal strength in all tested extremity muscles.  Sensory.: intact to touch ,pinprick .position and vibratory sensation.  Coordination: Rapid alternating movements normal in all extremities. Finger-to-nose and heel-to-shin performed accurately bilaterally. Gait and Station: Arises from chair without difficulty. Stance is normal. Gait demonstrates normal stride length with mild imbalance initially but stabilized without assistive device.   Reflexes: 1+ and symmetric. Toes downgoing.  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 ILR showed A fib 10/2020 - placed on Eliquis. Additional seizure 02/2021, restarted Keppra. 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      Acute confusion Unclear etiology, due to aphasia post  stroke difficulty fully assessing orientation although per daughter, not acting right and seemingly more confused has been more fatigued (wanting to stay in bed) since Sat (9/28) after a fall although unsure if related, does not believe she hit her head, no other neurological symptoms Restarted on sertraline last week, would recommend holding for a few days due to possible side effect Discussed proceeding to ED for emergent evaluation or continue to monitor and doing further workup outpatient. Both patient and daughter agree to continue to monitor for now and proceed with further workup outpatient, will order lab work and urinalysis, will order MRI brain to evaluate for vascular etiology and EEG to rule out underlying seizures Advised to call 911 immediately with any persistent or worsening symptoms and/or new/worsening neurological/stroke symptoms - daughter verbalized understanding  Seizure activity likely late effect of stroke Last seizure event 03/2022 due to CrCl of 49 and recent mood changes, recommend gradually reducing Keppra dosage. Will decrease to 750mg  AM and 1500mg  PM for 2 weeks then decrease to 750mg  BID.  Continue topiramate 100 mg twice daily Advised to monitor for any seizure activity, if this should occur, can consider initiating lamotrigine and gradually tapering off keppra if able  EEG 09/2021 normal  Vascular headaches Continue topiramate 100 mg twice daily Continue amitriptyline 50 mg nightly Continue Nurtec as needed  Hx of R MCA and L MCA stroke Eliquis 5 mg twice daily, asa 81mg  daily and atorvastatin 80 mg daily for secondary stroke prevention managed/monitored by PCP/cardiology Continue routine follow-up with cardiology and PCP for stroke risk factor management      Follow-up in 6 months or call earlier if needed    CC:  Deeann Saint, MD   I spent 45 minutes of face-to-face and non-face-to-face time with patient and daughter.  This included previsit  chart review, lab review, study review, order entry, electronic health record documentation, patient and daughter education and discussion regarding above diagnoses and answered all other questions to patient and daughters satisfaction  Ihor Austin, [facility]  [facility] [address] [place], [place] [address]  Phone [phone] Fax [phone] Note: This document was prepared with digital dictation and possible smart phrase technology. Any transcriptional errors that result from this process are unintentional.

## 2023-03-07 ENCOUNTER — Telehealth: Payer: Self-pay | Admitting: Adult Health

## 2023-03-07 LAB — URINALYSIS
Bilirubin, UA: NEGATIVE
Glucose, UA: NEGATIVE
Ketones, UA: NEGATIVE
Nitrite, UA: NEGATIVE
RBC, UA: NEGATIVE
Specific Gravity, UA: 1.013 (ref 1.005–1.030)
Urobilinogen, Ur: 0.2 mg/dL (ref 0.2–1.0)
pH, UA: 6 (ref 5.0–7.5)

## 2023-03-07 LAB — CBC WITH DIFFERENTIAL/PLATELET
Basophils Absolute: 0 10*3/uL (ref 0.0–0.2)
Basos: 1 %
EOS (ABSOLUTE): 0.2 10*3/uL (ref 0.0–0.4)
Eos: 4 %
Hematocrit: 37.9 % (ref 34.0–46.6)
Hemoglobin: 11.7 g/dL (ref 11.1–15.9)
Immature Grans (Abs): 0 10*3/uL (ref 0.0–0.1)
Immature Granulocytes: 0 %
Lymphocytes Absolute: 1.5 10*3/uL (ref 0.7–3.1)
Lymphs: 34 %
MCH: 27.9 pg (ref 26.6–33.0)
MCHC: 30.9 g/dL — ABNORMAL LOW (ref 31.5–35.7)
MCV: 91 fL (ref 79–97)
Monocytes Absolute: 0.4 10*3/uL (ref 0.1–0.9)
Monocytes: 9 %
Neutrophils Absolute: 2.4 10*3/uL (ref 1.4–7.0)
Neutrophils: 52 %
Platelets: 416 10*3/uL (ref 150–450)
RBC: 4.19 x10E6/uL (ref 3.77–5.28)
RDW: 12.9 % (ref 11.7–15.4)
WBC: 4.5 10*3/uL (ref 3.4–10.8)

## 2023-03-07 LAB — COMPREHENSIVE METABOLIC PANEL
ALT: 12 [IU]/L (ref 0–32)
AST: 24 [IU]/L (ref 0–40)
Albumin: 3.8 g/dL — ABNORMAL LOW (ref 3.9–4.9)
Alkaline Phosphatase: 191 [IU]/L — ABNORMAL HIGH (ref 44–121)
BUN/Creatinine Ratio: 11 — ABNORMAL LOW (ref 12–28)
BUN: 19 mg/dL (ref 8–27)
Bilirubin Total: 0.2 mg/dL (ref 0.0–1.2)
CO2: 18 mmol/L — ABNORMAL LOW (ref 20–29)
Calcium: 8.9 mg/dL (ref 8.7–10.3)
Chloride: 111 mmol/L — ABNORMAL HIGH (ref 96–106)
Creatinine, Ser: 1.72 mg/dL — ABNORMAL HIGH (ref 0.57–1.00)
Globulin, Total: 2.6 g/dL (ref 1.5–4.5)
Glucose: 65 mg/dL — ABNORMAL LOW (ref 70–99)
Potassium: 4.9 mmol/L (ref 3.5–5.2)
Sodium: 144 mmol/L (ref 134–144)
Total Protein: 6.4 g/dL (ref 6.0–8.5)
eGFR: 32 mL/min/{1.73_m2} — ABNORMAL LOW (ref >59)

## 2023-03-07 NOTE — Telephone Encounter (Signed)
[employer] medicare NPR sent to [facility] due to her pacemaker. [phone]

## 2023-03-08 LAB — URINE CULTURE

## 2023-03-14 ENCOUNTER — Ambulatory Visit (HOSPITAL_COMMUNITY)
Admission: RE | Admit: 2023-03-14 | Discharge: 2023-03-14 | Disposition: A | Discharge status: Home / Self Care | Payer: Medicare Other | Source: Ambulatory Visit | Attending: Adult Health | Admitting: Adult Health

## 2023-03-14 DIAGNOSIS — R410 Disorientation, unspecified: Secondary | ICD-10-CM | POA: Insufficient documentation

## 2023-03-25 ENCOUNTER — Ambulatory Visit: Payer: Medicare Other

## 2023-03-25 ENCOUNTER — Ambulatory Visit (INDEPENDENT_AMBULATORY_CARE_PROVIDER_SITE_OTHER): Payer: Medicare Other

## 2023-03-25 DIAGNOSIS — I639 Cerebral infarction, unspecified: Secondary | ICD-10-CM

## 2023-03-25 LAB — CUP PACEART REMOTE DEVICE CHECK
Date Time Interrogation Session: 20241020230521
Implantable Pulse Generator Implant Date: 20220407

## 2023-04-04 ENCOUNTER — Ambulatory Visit: Payer: Medicare Other | Admitting: Neurology

## 2023-04-04 DIAGNOSIS — R410 Disorientation, unspecified: Secondary | ICD-10-CM

## 2023-04-04 DIAGNOSIS — R4182 Altered mental status, unspecified: Secondary | ICD-10-CM

## 2023-04-07 NOTE — Progress Notes (Unsigned)
Cardiology Office Note Date:  04/07/2023  Patient ID:  Courtney Burke, Courtney Burke 1955-10-07, MRN [id number] PCP:  Deeann Saint, MD  Electrophysiologist: Dr. Elberta Fortis    Chief Complaint:  scheduled as a 16 mo visit  History of Present Illness: Courtney Burke is a 67 y.o. female with history of DM, HTN, HLD, CKD (IIIb), obesity s/p gastric bypass 2017, Lap sleeve gastrectomy revision to sleeve gastric bypass surgery on 05/09/20.), strokes, post stroke seizure d/o  She was referred to Dr. Elberta Fortis April 2022, after being admitted February 2022 with confusion, seizure-like activity, drooling, left gaze, and right-sided weakness.  She was found to have a MCA occlusion and is status post MCA stenting.  This was an embolic pattern with an unclear source.  She had a full work-up without cause for her stroke, for consideration of loop implant She underwent monitor implant at her visit.  Afib was subsequently found and referred to the AFib clinic May 2022, in d/w Dr. Pearlean Brownie,  stop ASA but to continue brilinta for MCA stenting with resultant TICI 2C revascularization and add eliquis 5 mg bid. AT her f/u there 10/20/20, she was doing well, tolerating medication changes.  Re admitted from 05/10/21 to 05/12/21 for acute temporooccipital cerebral infarction, seizures and AKI, ASA added for triple therapy  05/15/21 readmitted with more symptoms and found to have extension of her stroke, suspect 2/2 hypotension Continued on Eliquis, ASA 12/14/ suspect seizure and keppra increased  Saw neurology Jan 2023 with no new events/symptoms, was not taking the ASA but compliant with Eliquis, and advised to resume ASA as well  I saw her 08/24/21 She is accompanied by her daughter Observed to walk in, slightly unsteady gate She answers her name to question of what is her birth date, though eventually answers appropriately Initially she is communicating well, though sudden absence in verbal response She did not  lose postural tone, though had a blank stare and no response, facial expression notes clenched teeth She was on the exam table, brought to supine without change in MS, code called for siezure. Her daughter confirms this is how her known seizures appear BP 128/70 She was connected to loop tablet and was SR 70's-80's. She started to slowly start mumbling, stuttering, and then connecting visually to [place]. She had no convulsive activity. BP and HR/rhythm remained stable throughout. She was post ictal, able to eventually tell us who her daughter was Remained with repetitive speech pattern, confusion for abut 10 minutes then steadily improving. No focal neuro/motor deficits are appreciated. DOD felt that if she cleared to baseline, would be OK to go home. Her daughter confirmed this as usual seizure for her. And did get back to her baseline I reached out to Dr. Pearlean Brownie. He agreed if back to baseline OK to go home, recommended that her Keppra be increased to 1000mg  AM and up to 1500mg  (1 1/2 tabs) PM and to the ER if another seizure.  Pt/daughter to call their office for follow up with them Discussed her hx of 2 bariatric surgeries, discussed some concern of perhaps poor absorption of her Eliquis, ? If change to warfarin given her strokes,  with Dr. Pearlean Brownie, for now, he would like no changes. ALSO in d/w Dr. Elberta Fortis as well (afterwards), date of her last stroke, does not line up with an AFib episode, and perhaps AFib not the cause of all her strokes either.  She saw A Tillery, PA-C 09/28/21, some reported orthostatic lightheadedness No recurrent AF No  changes made  TODAY She is accompanied by her daughter She has lsot her phone, not sure where, we discussed hoe transmitter vs installing APP when she gets a new phone they prefer the transmitter  She is doing well She is either doing nothing or going 100%, nothing in between, sometimes when she gets worked up she will get a bit sweaty, start to look like  she is having a seizure, but will get her settled and if needed gives an extra Keppra, this apparently at the direction of neurology.  No syncope, near syncope The pt today reports her breasts are to big and heavy No obvious c/o palpitations    Device information MDT LINQ implanted 09/08/2020, cryptogenic stroke   Past Medical History:  Diagnosis Date   Acute ischemic left MCA stroke (HCC) 06/30/2020   Acute ischemic stroke (HCC)    Acute stroke due to occlusion of left middle cerebral artery (HCC) 06/30/2020   Age-related nuclear cataract of both eyes 09/28/2015   Arthralgia of multiple joints 09/28/2015   Asthma    Borderline diabetes    Cerebrovascular accident (CVA) (HCC)    Chronic bilateral thoracic back pain 06/05/2018   Chronic renal insufficiency, stage 1 08/11/2014   Diabetes mellitus type 2 in obese    Dyslipidemia    Dysphagia, post-stroke    Erosive gastritis 07/20/2019   Formatting of this note might be different from the original. On EGD 07/2019   Esophagitis 07/20/2019   Formatting of this note might be different from the original. Added automatically from request for surgery [id number]  Formatting of this note might be different from the original. On EGD 07/2019   Essential hypertension 09/28/2015   Essential thrombocytosis (HCC) 09/28/2015   Gallstones 08/18/2019   Formatting of this note might be different from the original. 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   Hyperlipidemia, unspecified 03/16/2013   Hypertension    Keratoconjunctivitis sicca of both eyes not specified as Sjogren's 09/28/2015   Left middle cerebral artery stroke (HCC) 07/09/2020   Morbid obesity (HCC) 05/21/2013   Seizures (HCC)    Stage 3b chronic kidney disease (HCC)    Status post gastric bypass for obesity 09/28/2015    Past Surgical History:  Procedure  Laterality Date   ANKLE SURGERY     CARPAL TUNNEL RELEASE     carpel tunnel     ESOPHAGOGASTRODUODENOSCOPY (EGD) WITH PROPOFOL N/A 11/14/2021   Procedure: ESOPHAGOGASTRODUODENOSCOPY (EGD) WITH PROPOFOL;  Surgeon: Iva Boop, MD;  Location: [facility] ENDOSCOPY;  Service: Gastroenterology;  Laterality: N/A;   IR ANGIO INTRA EXTRACRAN SEL COM CAROTID INNOMINATE BILAT MOD SED  01/17/2021   IR ANGIO VERTEBRAL SEL SUBCLAVIAN INNOMINATE UNI R MOD SED  01/17/2021   IR CT HEAD LTD  06/30/2020   IR INTRA CRAN STENT  06/30/2020   IR PERCUTANEOUS ART THROMBECTOMY/INFUSION INTRACRANIAL INC DIAG ANGIO  06/30/2020   IR RADIOLOGIST EVAL & MGMT  08/26/2020   IR US GUIDE VASC ACCESS RIGHT  01/17/2021   RADIOLOGY WITH ANESTHESIA N/A 06/30/2020   Procedure: RADIOLOGY WITH ANESTHESIA;  Surgeon: Radiologist, Medication, MD;  Location: [facility] OR;  Service: Radiology;  Laterality: N/A;    Current Outpatient Medications  Medication Sig Dispense Refill   acetaminophen (TYLENOL) 500 MG tablet Take 1,000 mg by mouth every 6 (six) hours as needed for moderate pain or headache.     amitriptyline (ELAVIL) 50 MG tablet  Take 1 tablet (50 mg total) by mouth at bedtime. 90 tablet 3   amLODipine (NORVASC) 10 MG tablet Take 1 tablet by mouth daily.     apixaban (ELIQUIS) 5 MG TABS tablet Take 1 tablet (5 mg total) by mouth 2 (two) times daily. 60 tablet 2   aspirin EC 81 MG tablet Take 1 tablet (81 mg total) by mouth daily. Swallow whole.     atorvastatin (LIPITOR) 80 MG tablet Take 1 tablet (80 mg total) by mouth daily. 90 tablet 3   cholecalciferol (VITAMIN D3) 25 MCG (1000 UNIT) tablet Take by mouth.     KLOR-CON M20 20 MEQ tablet Take 40 mEq by mouth 2 (two) times daily.     levETIRAcetam (KEPPRA) 750 MG tablet Take 1 tablet (750 mg total) by mouth 2 (two) times daily. 180 tablet 3   metoCLOPramide (REGLAN) 5 MG tablet Take 1 tablet (5 mg total) by mouth every 8 (eight) hours as needed for up to 10 days for nausea or vomiting. 30  tablet 0   Multiple Vitamin (MULTIVITAMIN WITH MINERALS) TABS tablet Take 1 tablet by mouth daily.     omeprazole (PRILOSEC) 40 MG capsule Take 1 tablet by mouth daily.     ondansetron (ZOFRAN-ODT) 4 MG disintegrating tablet Take by mouth.     pantoprazole (PROTONIX) 40 MG tablet Take 40 mg by mouth daily.     Rimegepant Sulfate (NURTEC) 75 MG TBDP Take 1 tablet (75 mg total) by mouth as needed. 16 tablet 5   sertraline (ZOLOFT) 25 MG tablet Take 1 tablet (25 mg total) by mouth daily. 90 tablet 1   topiramate (TOPAMAX) 100 MG tablet Take 1 tablet (100 mg total) by mouth 2 (two) times daily. Take 1 tablet twice daily 180 tablet 3   ursodiol (ACTIGALL) 250 MG tablet Take 250 mg by mouth 2 (two) times daily.     valsartan (DIOVAN) 160 MG tablet Take by mouth.     Vitamin D, Ergocalciferol, (DRISDOL) 1.25 MG (50000 UNIT) CAPS capsule Take 1 capsule (50,000 Units total) by mouth every 7 (seven) days. 12 capsule 0   No current facility-administered medications for this visit.    Allergies:   Homatropine, Iodinated contrast media, Doxycycline, Sulfa antibiotics, Codeine, Hydrocodone, Hydrocodone bit-homatrop mbr, Sulfamethoxazole, and Ace inhibitors   Social History:  The patient  reports that she has never smoked. She has never used smokeless tobacco. She reports that she does not drink alcohol and does not use drugs.   Family History:  The patient's family history includes Stroke in her maternal grandfather.  ROS:  Please see the history of present illness.    All other systems are reviewed and otherwise negative.   PHYSICAL EXAM:  VS:  LMP 09/28/2012  BMI: There is no height or weight on file to calculate BMI. Well nourished, well developed, in no acute distress HEENT: normocephalic, atraumatic Neck: no JVD, carotid bruits or masses Cardiac:  RRR; no significant murmurs, no rubs, or gallops Lungs: CTA b/l, no wheezing, rhonchi or rales Abd: soft, nontender MS: no deformity or  atrophy Ext: no edema Skin: warm and dry, no rash Neuro:  as discussed above Psych: euthymic mood, full affect  ILR site is stable, no tethering or discomfort   EKG: done today and reviewed by myself SR 70bpm   Device interrogation done today and reviewed by myself:  Battery is good R wave 0.78mV + false AFib episodes   05/11/2021: TTE  1. Left ventricular ejection fraction,  by estimation, is 55 to 60%. Left  ventricular ejection fraction by 3D volume is 58 %. The left ventricle has  normal function. The left ventricle has no regional wall motion  abnormalities. There is moderate  asymmetric left ventricular hypertrophy of the basal-septal segment. Left  ventricular diastolic parameters are consistent with Grade I diastolic  dysfunction (impaired relaxation).   2. Right ventricular systolic function is normal. The right ventricular  size is normal. There is mildly elevated pulmonary artery systolic  pressure. The estimated right ventricular systolic pressure is 36.1 mmHg.   3. The mitral valve is degenerative. Trivial mitral valve regurgitation.  No evidence of mitral stenosis.   4. Tricuspid valve regurgitation is mild to moderate.   5. The aortic valve is tricuspid. There is mild calcification of the  aortic valve. There is mild thickening of the aortic valve. Aortic valve  regurgitation is not visualized. Aortic valve sclerosis/calcification is  present, without any evidence of  aortic stenosis.   6. The inferior vena cava is normal in size with <50% respiratory  variability, suggesting right atrial pressure of 8 mmHg.   Comparison(s): No prior Echocardiogram.   Recent Labs: 02/13/2023: TSH 1.40 03/06/2023: ALT 12; BUN 19; Creatinine, Ser 1.72; Hemoglobin 11.7; Platelets 416; Potassium 4.9; Sodium 144  08/23/2022: Chol/HDL Ratio 1.6; Cholesterol, Total 111; HDL 70; LDL Chol Calc (NIH) 26; Triglycerides 71   CrCl cannot be calculated (Patient's most recent lab result is  older than the maximum 21 days allowed.).   Wt Readings from Last 3 Encounters:  03/06/23 189 lb (85.7 kg)  02/13/23 189 lb 6.4 oz (85.9 kg)  10/01/22 178 lb 12.8 oz (81.1 kg)     Other studies reviewed: Additional studies/records reviewed today include: summarized above  ASSESSMENT AND PLAN:  Paroxysmal Afib CHA2DS2Vasc is 6, on Eliquis, appropriately dosed for age/weight Zero true % burden  They were provided a transmitter, advised on pairing and to call the device clini 24 hours after pairing to confirm completed  2. HTN She is off valsartan for a couple weeks, pending refill auth from her new PMD Given today's BP, advised to stop it  3. Secondary hypercoagulable state     Disposition: remotes as usual, in clinic annually, sooner if needed  Current medicines are reviewed at length with the patient today.  The patient did not have any concerns regarding medicines.  Norma Fredrickson, PA-C 04/07/2023 9:06 AM     [facility] [address] [place] [address] [phone] (office)  [phone] (fax)

## 2023-04-09 ENCOUNTER — Encounter: Payer: Self-pay | Admitting: Physician Assistant

## 2023-04-09 ENCOUNTER — Ambulatory Visit: Payer: Medicare Other | Attending: Physician Assistant | Admitting: Physician Assistant

## 2023-04-09 VITALS — BP 110/72 | HR 70 | Ht 62.0 in | Wt 186.8 lb

## 2023-04-09 DIAGNOSIS — D6869 Other thrombophilia: Secondary | ICD-10-CM

## 2023-04-09 DIAGNOSIS — Z4509 Encounter for adjustment and management of other cardiac device: Secondary | ICD-10-CM | POA: Diagnosis not present

## 2023-04-09 DIAGNOSIS — I480 Paroxysmal atrial fibrillation: Secondary | ICD-10-CM | POA: Diagnosis not present

## 2023-04-09 DIAGNOSIS — I10 Essential (primary) hypertension: Secondary | ICD-10-CM | POA: Diagnosis not present

## 2023-04-09 LAB — CUP PACEART INCLINIC DEVICE CHECK
Date Time Interrogation Session: 20241105164500
Implantable Pulse Generator Implant Date: 20220407

## 2023-04-09 NOTE — Progress Notes (Signed)
Carelink Summary Report / Loop Recorder 

## 2023-04-09 NOTE — Patient Instructions (Addendum)
Medication Instructions:  STOP Valsartan  *If you need a refill on your cardiac medications before your next appointment, please call your pharmacy*   Lab Work: None ordered   Testing/Procedures: CALL [name] 24 HOURS AFTERT PAIRING THE DEVICE AT [phone]   Follow-Up: At [facility], you and your health needs are our priority.  As part of our continuing mission to provide you with exceptional heart care, we have created designated Provider Care Teams.  These Care Teams include your primary Cardiologist (physician) and Advanced Practice Providers (APPs -  Physician Assistants and Nurse Practitioners) who all work together to provide you with the care you need, when you need it.  We recommend signing up for the patient portal called "MyChart".  Sign up information is provided on this After Visit Summary.  MyChart is used to connect with patients for Virtual Visits (Telemedicine).  Patients are able to view lab/test results, encounter notes, upcoming appointments, etc.  Non-urgent messages can be sent to your provider as well.   To learn more about what you can do with MyChart, go to [email or web address].    Your next appointment:   12 month(s)  Provider:   Loman Brooklyn, MD or Francis Dowse, PA-C    Other Instructions

## 2023-04-29 ENCOUNTER — Ambulatory Visit: Payer: Medicare Other

## 2023-04-29 DIAGNOSIS — I639 Cerebral infarction, unspecified: Secondary | ICD-10-CM | POA: Diagnosis not present

## 2023-04-30 ENCOUNTER — Telehealth: Payer: Self-pay | Admitting: Adult Health

## 2023-04-30 NOTE — Telephone Encounter (Signed)
Called and advised per last OV note that patient probably needs to be seen by Urgent care or ER for evaluation and labs to rule out any underlying infection. She was agreeable to plan and grateful for the call

## 2023-04-30 NOTE — Telephone Encounter (Signed)
[name] is asking for a call  to discuss the concern of pt leaving home, she walks without giving notice to family.  Please call her to discuss.

## 2023-05-01 LAB — CUP PACEART REMOTE DEVICE CHECK
Date Time Interrogation Session: 20241126110053
Implantable Pulse Generator Implant Date: 20220407

## 2023-05-15 ENCOUNTER — Encounter: Payer: Self-pay | Admitting: Family Medicine

## 2023-05-15 ENCOUNTER — Ambulatory Visit: Payer: Medicare Other | Admitting: Family Medicine

## 2023-05-15 VITALS — BP 122/70 | HR 82 | Temp 98.8°F | Ht 62.0 in | Wt 185.8 lb

## 2023-05-15 DIAGNOSIS — M67479 Ganglion, unspecified ankle and foot: Secondary | ICD-10-CM

## 2023-05-15 DIAGNOSIS — I69320 Aphasia following cerebral infarction: Secondary | ICD-10-CM | POA: Diagnosis not present

## 2023-05-15 DIAGNOSIS — M79671 Pain in right foot: Secondary | ICD-10-CM | POA: Diagnosis not present

## 2023-05-15 DIAGNOSIS — M65332 Trigger finger, left middle finger: Secondary | ICD-10-CM | POA: Diagnosis not present

## 2023-05-15 DIAGNOSIS — I10 Essential (primary) hypertension: Secondary | ICD-10-CM

## 2023-05-15 DIAGNOSIS — R4586 Emotional lability: Secondary | ICD-10-CM

## 2023-05-15 NOTE — Progress Notes (Signed)
Established Patient Office Visit   Subjective  Patient ID: Courtney Burke, female    DOB: 05/27/1956  Age: 67 y.o. MRN: [id number]  Chief Complaint  Patient presents with   Medical Management of Chronic Issues    3 month follow-up, for stroke and mood swings, patient daughter states she is doing better since the last visit   Pt accompanied by her daughter  Patient is a 67 year old female seen for follow-up.  Pt sad due to the death of her puppy on Jun 06, 2023.  Patient now adjusting to being at home alone during the day.  Patient's brother who was in the home during the day is currently out of town visiting relatives.  Patient wants to work on her speech.  Suffers from aphasia status post CVA.  Patient's daughter inquires about adult daycare programs in the area.  Improvement in mood noted since starting Zoloft 25 mg daily.  Patient with cyst on right foot that is painful.  Patient also notes continued triggering of third and fourth digits on bilateral hands.  Also notes bony nodules on dorsum of hands at DIP joints.   Patient Active Problem List   Diagnosis Date Noted   Trigger middle finger of left hand 03/28/2022   Aphasia following cerebrovascular accident (CVA) 03/28/2022   Hypokalemia 11/12/2021   Hypomagnesemia 11/12/2021   Normocytic anemia 11/10/2021   Hyponatremia 11/09/2021   Metabolic acidosis 11/09/2021   Intractable nausea and vomiting 11/08/2021   Paroxysmal atrial fibrillation (HCC) 11/08/2021   History of CVA (cerebrovascular accident) 07/24/2021   Syncope 05/16/2021   Breast lump 05/16/2021   Seizure disorder (HCC) 05/16/2021   CVA (cerebral vascular accident) (HCC) 05/11/2021   Acute cerebral infarction (HCC) 05/10/2021   Dyslipidemia 05/09/2021   Type 2 diabetes mellitus with obesity (HCC) 05/09/2021   Cerebral infarction due to unspecified occlusion or stenosis of left middle cerebral artery (HCC) 06/30/2020   Gallstones 08/18/2019   Esophagitis  07/20/2019   Erosive gastritis 07/20/2019   Nausea vomiting and diarrhea 07/16/2019   AKI (acute kidney injury) (HCC) 07/15/2019   Chronic bilateral thoracic back pain 06/05/2018   Large breasts 06/05/2018   Seasonal allergic rhinitis due to pollen 05/09/2017   Microcalcifications of the breast 07/02/2016   Mild intermittent asthma without complication 05/08/2016   Severe single current episode of major depressive disorder, without psychotic features (HCC) 05/08/2016   Vitamin D deficiency 05/08/2016   Encounter for health maintenance examination 05/08/2016   Persistent headaches 02/16/2016   Arthralgia of multiple joints 09/28/2015   Age-related nuclear cataract of both eyes 09/28/2015   Asthma 09/28/2015   Essential hypertension 09/28/2015   Essential thrombocytosis (HCC) 09/28/2015   Gastroesophageal reflux disease without esophagitis 09/28/2015   Keratoconjunctivitis sicca of both eyes not specified as Sjogren's 09/28/2015   Status post gastric bypass for obesity 09/28/2015   Stage 3b chronic kidney disease (HCC) 09/28/2015   Esophageal dysphagia 09/28/2015   Hot flashes 09/28/2015   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   IFG (impaired fasting glucose) 09/28/2015   Menopausal disorder 09/28/2015   Morbid obesity with BMI of 40.0-44.9, adult (HCC) 09/28/2015   Non-smoker 09/28/2015   Pelvic pain in female 09/28/2015   Presbyopia of both eyes 09/28/2015   Snoring 09/28/2015   Tinea corporis 09/28/2015   Osteopenia 07/15/2015   Benign hypertension with CKD (chronic kidney disease) stage III (HCC) 08/11/2014   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   Hyperlipidemia 03/16/2013   Past Medical History:  Diagnosis Date   Acute  ischemic left MCA stroke (HCC) 06/30/2020   Acute ischemic stroke (HCC)    Acute stroke due to occlusion of left middle cerebral artery (HCC) 06/30/2020   Age-related nuclear cataract of both eyes 09/28/2015   Arthralgia of multiple joints 09/28/2015    Asthma    Borderline diabetes    Cerebrovascular accident (CVA) (HCC)    Chronic bilateral thoracic back pain 06/05/2018   Chronic renal insufficiency, stage 1 08/11/2014   Diabetes mellitus type 2 in obese    Dyslipidemia    Dysphagia, post-stroke    Erosive gastritis 07/20/2019   Formatting of this note might be different from the original. On EGD 07/2019   Esophagitis 07/20/2019   Formatting of this note might be different from the original. Added automatically from request for surgery [id number]  Formatting of this note might be different from the original. On EGD 07/2019   Essential hypertension 09/28/2015   Essential thrombocytosis (HCC) 09/28/2015   Gallstones 08/18/2019   Formatting of this note might be different from the original. Added automatically from request for surgery [id number]   Gastroesophageal reflux disease without esophagitis 09/28/2015   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   Hyperlipidemia, unspecified 03/16/2013   Hypertension    Keratoconjunctivitis sicca of both eyes not specified as Sjogren's 09/28/2015   Left middle cerebral artery stroke (HCC) 07/09/2020   Morbid obesity (HCC) 05/21/2013   Seizures (HCC)    Stage 3b chronic kidney disease (HCC)    Status post gastric bypass for obesity 09/28/2015   Past Surgical History:  Procedure Laterality Date   ANKLE SURGERY     CARPAL TUNNEL RELEASE     carpel tunnel     ESOPHAGOGASTRODUODENOSCOPY (EGD) WITH PROPOFOL N/A 11/14/2021   Procedure: ESOPHAGOGASTRODUODENOSCOPY (EGD) WITH PROPOFOL;  Surgeon: Iva Boop, MD;  Location: [facility] ENDOSCOPY;  Service: Gastroenterology;  Laterality: N/A;   IR ANGIO INTRA EXTRACRAN SEL COM CAROTID INNOMINATE BILAT MOD SED  01/17/2021   IR ANGIO VERTEBRAL SEL SUBCLAVIAN INNOMINATE UNI R MOD SED  01/17/2021   IR CT HEAD LTD  06/30/2020   IR INTRA CRAN STENT  06/30/2020   IR PERCUTANEOUS ART THROMBECTOMY/INFUSION INTRACRANIAL INC DIAG ANGIO  06/30/2020   IR RADIOLOGIST EVAL & MGMT   08/26/2020   IR US GUIDE VASC ACCESS RIGHT  01/17/2021   RADIOLOGY WITH ANESTHESIA N/A 06/30/2020   Procedure: RADIOLOGY WITH ANESTHESIA;  Surgeon: Radiologist, Medication, MD;  Location: [facility] OR;  Service: Radiology;  Laterality: N/A;   Social History   Tobacco Use   Smoking status: Never   Smokeless tobacco: Never  Vaping Use   Vaping status: Never Used  Substance Use Topics   Alcohol use: No   Drug use: No   Family History  Problem Relation Age of Onset   Stroke Maternal Grandfather    Allergies  Allergen Reactions   Homatropine Itching   Iodinated Contrast Media Itching and Other (See Comments)   Doxycycline Nausea And Vomiting and Other (See Comments)   Sulfa Antibiotics Rash and Hives   Codeine Hives and Swelling    Swollen tongue   Hydrocodone Itching   Hydrocodone Bit-Homatrop Mbr Itching   Sulfamethoxazole Hives   Ace Inhibitors Cough, Itching and Rash      ROS Negative unless stated above    Objective:     BP 122/70 (BP Location: Left Arm, Patient Position: Sitting, Cuff Size: Large)   Pulse 82   Temp 98.8 F (37.1 C) (Oral)   Ht 5\' 2"  (1.575 m)  Wt 185 lb 12.8 oz (84.3 kg)   LMP 09/28/2012   SpO2 97%   BMI 33.98 kg/m  BP Readings from Last 3 Encounters:  05/15/23 122/70  04/09/23 110/72  03/06/23 123/73   Wt Readings from Last 3 Encounters:  05/15/23 185 lb 12.8 oz (84.3 kg)  04/09/23 186 lb 12.8 oz (84.7 kg)  03/06/23 189 lb (85.7 kg)      Physical Exam Constitutional:      General: She is not in acute distress.    Appearance: Normal appearance.  HENT:     Head: Normocephalic and atraumatic.     Nose: Nose normal.     Mouth/Throat:     Mouth: Mucous membranes are moist.  Cardiovascular:     Rate and Rhythm: Normal rate and regular rhythm.     Heart sounds: Normal heart sounds. No murmur heard.    No gallop.  Pulmonary:     Effort: Pulmonary effort is normal. No respiratory distress.     Breath sounds: Normal breath sounds. No  wheezing, rhonchi or rales.  Skin:    General: Skin is warm and dry.  Neurological:     Mental Status: She is alert and oriented to person, place, and time. Mental status is at baseline.     Comments: Aphasia.   Diabetic Foot Exam - Simple   Simple Foot Form Diabetic Foot exam was performed with the following findings: Yes 05/15/2023  2:23 PM  Visual Inspection No deformities, no ulcerations, no other skin breakdown bilaterally: Yes See comments: Yes Sensation Testing Intact to touch and monofilament testing bilaterally: Yes Pulse Check Posterior Tibialis and Dorsalis pulse intact bilaterally: Yes Comments Cyst of dorsum of R mid foot.  Bilateral heels with thick calluses      No results found for any visits on 05/15/23.    Assessment & Plan:  Trigger middle finger of left hand -     Ambulatory referral to Hand Surgery  Ganglion cyst of foot -     Ambulatory referral to Podiatry  Right foot pain -Likely due to cyst -Okay to use Tylenol, heat, rest, supportive insoles. -     Ambulatory referral to Podiatry  Aphasia following cerebrovascular accident (CVA) -     Ambulatory referral to Speech Therapy  Essential hypertension -Controlled -Continue current medication including Norvasc 10 mg daily -Lifestyle modifications  Mood change -Improvement with Zoloft 25 mg daily -Continue Zoloft 25 mg -Currently dealing with grief due to the loss of her puppy.  Continue to monitor.   Return in about 3 months (around 08/13/2023), or if symptoms worsen or fail to improve.   Deeann Saint, MD

## 2023-05-21 ENCOUNTER — Ambulatory Visit (INDEPENDENT_AMBULATORY_CARE_PROVIDER_SITE_OTHER): Payer: Medicare Other

## 2023-05-21 ENCOUNTER — Ambulatory Visit (INDEPENDENT_AMBULATORY_CARE_PROVIDER_SITE_OTHER): Payer: Medicare Other | Admitting: Podiatry

## 2023-05-21 ENCOUNTER — Encounter: Payer: Self-pay | Admitting: Podiatry

## 2023-05-21 DIAGNOSIS — M6740 Ganglion, unspecified site: Secondary | ICD-10-CM

## 2023-05-21 DIAGNOSIS — M19071 Primary osteoarthritis, right ankle and foot: Secondary | ICD-10-CM | POA: Diagnosis not present

## 2023-05-21 DIAGNOSIS — M19072 Primary osteoarthritis, left ankle and foot: Secondary | ICD-10-CM

## 2023-05-21 MED ORDER — TRIAMCINOLONE ACETONIDE 40 MG/ML IJ SUSP
40.0000 mg | Freq: Once | INTRAMUSCULAR | Status: AC
  Administered 2023-05-21: 40 mg

## 2023-05-21 NOTE — Progress Notes (Signed)
Subjective:  Patient ID: Courtney Burke, female    DOB: 19-Nov-1955,  MRN: [id number] HPI Chief Complaint  Patient presents with   Foot Pain    RM#8 pt-possible ganglion cyst R foot with pain     67 y.o. female presents with the above complaint.   ROS: Denies fever chills nausea vomiting muscle aches pains calf pain back pain chest pain shortness of breath.  Had a severe stroke that affects her speech center.  So she cannot accurately verbalize her thoughts.  Her daughter usually has to help her.  Past Medical History:  Diagnosis Date   Acute ischemic left MCA stroke (HCC) 06/30/2020   Acute ischemic stroke (HCC)    Acute stroke due to occlusion of left middle cerebral artery (HCC) 06/30/2020   Age-related nuclear cataract of both eyes 09/28/2015   Arthralgia of multiple joints 09/28/2015   Asthma    Borderline diabetes    Cerebrovascular accident (CVA) (HCC)    Chronic bilateral thoracic back pain 06/05/2018   Chronic renal insufficiency, stage 1 08/11/2014   Diabetes mellitus type 2 in obese    Dyslipidemia    Dysphagia, post-stroke    Erosive gastritis 07/20/2019   Formatting of this note might be different from the original. On EGD 07/2019   Esophagitis 07/20/2019   Formatting of this note might be different from the original. Added automatically from request for surgery [id number]  Formatting of this note might be different from the original. On EGD 07/2019   Essential hypertension 09/28/2015   Essential thrombocytosis (HCC) 09/28/2015   Gallstones 08/18/2019   Formatting of this note might be different from the original. Added automatically from request for surgery [id number]   Gastroesophageal reflux disease without esophagitis 09/28/2015   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   Hyperlipidemia, unspecified 03/16/2013   Hypertension    Keratoconjunctivitis sicca of both eyes not specified as Sjogren's 09/28/2015   Left middle cerebral artery stroke (HCC) 07/09/2020    Morbid obesity (HCC) 05/21/2013   Seizures (HCC)    Stage 3b chronic kidney disease (HCC)    Status post gastric bypass for obesity 09/28/2015   Past Surgical History:  Procedure Laterality Date   ANKLE SURGERY     CARPAL TUNNEL RELEASE     carpel tunnel     ESOPHAGOGASTRODUODENOSCOPY (EGD) WITH PROPOFOL N/A 11/14/2021   Procedure: ESOPHAGOGASTRODUODENOSCOPY (EGD) WITH PROPOFOL;  Surgeon: Iva Boop, MD;  Location: [facility] ENDOSCOPY;  Service: Gastroenterology;  Laterality: N/A;   IR ANGIO INTRA EXTRACRAN SEL COM CAROTID INNOMINATE BILAT MOD SED  01/17/2021   IR ANGIO VERTEBRAL SEL SUBCLAVIAN INNOMINATE UNI R MOD SED  01/17/2021   IR CT HEAD LTD  06/30/2020   IR INTRA CRAN STENT  06/30/2020   IR PERCUTANEOUS ART THROMBECTOMY/INFUSION INTRACRANIAL INC DIAG ANGIO  06/30/2020   IR RADIOLOGIST EVAL & MGMT  08/26/2020   IR US GUIDE VASC ACCESS RIGHT  01/17/2021   RADIOLOGY WITH ANESTHESIA N/A 06/30/2020   Procedure: RADIOLOGY WITH ANESTHESIA;  Surgeon: Radiologist, Medication, MD;  Location: [facility] OR;  Service: Radiology;  Laterality: N/A;    Current Outpatient Medications:    acetaminophen (TYLENOL) 500 MG tablet, Take 1,000 mg by mouth every 6 (six) hours as needed for moderate pain or headache., Disp: , Rfl:    amitriptyline (ELAVIL) 50 MG tablet, Take 1 tablet (50 mg total) by mouth at bedtime., Disp: 90 tablet, Rfl: 3   amLODipine (NORVASC) 10 MG tablet, Take 1 tablet by mouth daily., Disp: , Rfl:  apixaban (ELIQUIS) 5 MG TABS tablet, Take 1 tablet (5 mg total) by mouth 2 (two) times daily., Disp: 60 tablet, Rfl: 2   aspirin EC 81 MG tablet, Take 1 tablet (81 mg total) by mouth daily. Swallow whole., Disp: , Rfl:    atorvastatin (LIPITOR) 80 MG tablet, Take 1 tablet (80 mg total) by mouth daily., Disp: 90 tablet, Rfl: 3   cholecalciferol (VITAMIN D3) 25 MCG (1000 UNIT) tablet, Take by mouth., Disp: , Rfl:    KLOR-CON M20 20 MEQ tablet, Take 40 mEq by mouth 2 (two) times daily., Disp: , Rfl:     levETIRAcetam (KEPPRA) 750 MG tablet, Take 1 tablet (750 mg total) by mouth 2 (two) times daily., Disp: 180 tablet, Rfl: 3   Multiple Vitamin (MULTIVITAMIN WITH MINERALS) TABS tablet, Take 1 tablet by mouth daily., Disp: , Rfl:    omeprazole (PRILOSEC) 40 MG capsule, Take 1 tablet by mouth daily., Disp: , Rfl:    ondansetron (ZOFRAN-ODT) 4 MG disintegrating tablet, Take by mouth., Disp: , Rfl:    pantoprazole (PROTONIX) 40 MG tablet, Take 40 mg by mouth daily., Disp: , Rfl:    Rimegepant Sulfate (NURTEC) 75 MG TBDP, Take 1 tablet (75 mg total) by mouth as needed., Disp: 16 tablet, Rfl: 5   sertraline (ZOLOFT) 25 MG tablet, Take 1 tablet (25 mg total) by mouth daily., Disp: 90 tablet, Rfl: 1   topiramate (TOPAMAX) 100 MG tablet, Take 1 tablet (100 mg total) by mouth 2 (two) times daily. Take 1 tablet twice daily, Disp: 180 tablet, Rfl: 3   ursodiol (ACTIGALL) 250 MG tablet, Take 250 mg by mouth 2 (two) times daily., Disp: , Rfl:    Vitamin D, Ergocalciferol, (DRISDOL) 1.25 MG (50000 UNIT) CAPS capsule, Take 1 capsule (50,000 Units total) by mouth every 7 (seven) days., Disp: 12 capsule, Rfl: 0   metoCLOPramide (REGLAN) 5 MG tablet, Take 1 tablet (5 mg total) by mouth every 8 (eight) hours as needed for up to 10 days for nausea or vomiting., Disp: 30 tablet, Rfl: 0  Allergies  Allergen Reactions   Homatropine Itching   Iodinated Contrast Media Itching and Other (See Comments)   Doxycycline Nausea And Vomiting and Other (See Comments)   Sulfa Antibiotics Rash and Hives   Codeine Hives and Swelling    Swollen tongue   Hydrocodone Itching   Hydrocodone Bit-Homatrop Mbr Itching   Sulfamethoxazole Hives   Ace Inhibitors Cough, Itching and Rash   Review of Systems Objective:  There were no vitals filed for this visit.  General: Well developed, nourished, in no acute distress, alert and oriented x3   Dermatological: Skin is warm, dry and supple bilateral. Nails x 10 are well maintained;  remaining integument appears unremarkable at this time. There are no open sores, no preulcerative lesions, no rash or signs of infection present.  Vascular: Dorsalis Pedis artery and Posterior Tibial artery pedal pulses are 2/4 bilateral with immedate capillary fill time. Pedal hair growth present. No varicosities and no lower extremity edema present bilateral.   Neruologic: Grossly intact via light touch bilateral. Vibratory intact via tuning fork bilateral. Protective threshold with Semmes Wienstein monofilament intact to all pedal sites bilateral. Patellar and Achilles deep tendon reflexes 2+ bilateral. No Babinski or clonus noted bilateral.   Musculoskeletal: No gross boney pedal deformities bilateral. No pain, crepitus, or limitation noted with foot and ankle range of motion bilateral. Muscular strength 5/5 in all groups tested bilateral.  Dorsal spurs tarsometatarsal joints left and  right.  Gait: Unassisted, Nonantalgic.    Radiographs:  Radiographs left foot does demonstrate dorsal exostosis and spurring at the navicular cuneiform joint left.  Also ankle fracture that has healed.  Assessment & Plan:   Assessment: Dorsal spurring no cysts are identified.  This is bilateral.  Plan: Injected around the spurs today with Kenalog and local anesthetic tolerated procedure well.  A total of 20 mg split between the 2 feet was injected.     Clarita Mcelvain T. [place], [place]

## 2023-05-23 NOTE — Addendum Note (Signed)
Addended by: Geralyn Flash D on: 05/23/2023 04:48 PM   Modules accepted: Orders

## 2023-05-23 NOTE — Progress Notes (Signed)
Carelink Summary Report / Loop Recorder 

## 2023-06-01 ENCOUNTER — Other Ambulatory Visit (HOSPITAL_COMMUNITY): Payer: Self-pay | Admitting: Cardiology

## 2023-06-01 DIAGNOSIS — I480 Paroxysmal atrial fibrillation: Secondary | ICD-10-CM

## 2023-06-03 ENCOUNTER — Ambulatory Visit (INDEPENDENT_AMBULATORY_CARE_PROVIDER_SITE_OTHER): Payer: Medicare Other

## 2023-06-03 DIAGNOSIS — I639 Cerebral infarction, unspecified: Secondary | ICD-10-CM

## 2023-06-03 LAB — CUP PACEART REMOTE DEVICE CHECK
Date Time Interrogation Session: 20241229230550
Implantable Pulse Generator Implant Date: 20220407

## 2023-06-03 NOTE — Telephone Encounter (Signed)
Prescription refill request for Eliquis received. Indication:afib Last office visit:11/24 Scr:1.72  10/24 Age: 67 Weight:84.3  kg  Prescription refilled

## 2023-06-24 ENCOUNTER — Ambulatory Visit: Payer: Medicare Other

## 2023-07-03 ENCOUNTER — Encounter: Payer: Medicare Other | Admitting: Family Medicine

## 2023-07-07 ENCOUNTER — Other Ambulatory Visit: Payer: Self-pay | Admitting: Family Medicine

## 2023-07-07 ENCOUNTER — Other Ambulatory Visit: Payer: Self-pay | Admitting: Adult Health

## 2023-07-07 DIAGNOSIS — Z8673 Personal history of transient ischemic attack (TIA), and cerebral infarction without residual deficits: Secondary | ICD-10-CM

## 2023-07-07 DIAGNOSIS — R4586 Emotional lability: Secondary | ICD-10-CM

## 2023-07-08 ENCOUNTER — Ambulatory Visit (INDEPENDENT_AMBULATORY_CARE_PROVIDER_SITE_OTHER): Payer: Medicare Other

## 2023-07-08 DIAGNOSIS — I639 Cerebral infarction, unspecified: Secondary | ICD-10-CM

## 2023-07-08 LAB — CUP PACEART REMOTE DEVICE CHECK
Date Time Interrogation Session: 20250202230522
Implantable Pulse Generator Implant Date: 20220407

## 2023-07-08 NOTE — Telephone Encounter (Signed)
Last seen on 03/06/23 Follow up scheduled on 09/26/22

## 2023-07-12 ENCOUNTER — Emergency Department (HOSPITAL_COMMUNITY)
Admission: EM | Admit: 2023-07-12 | Discharge: 2023-07-12 | Disposition: A | Discharge status: Home / Self Care | Payer: Medicare Other | Attending: Emergency Medicine | Admitting: Emergency Medicine

## 2023-07-12 ENCOUNTER — Emergency Department (HOSPITAL_COMMUNITY): Payer: Medicare Other

## 2023-07-12 ENCOUNTER — Ambulatory Visit: Payer: Medicare Other | Admitting: Urgent Care

## 2023-07-12 ENCOUNTER — Other Ambulatory Visit: Payer: Self-pay

## 2023-07-12 ENCOUNTER — Ambulatory Visit: Payer: Self-pay | Admitting: Family Medicine

## 2023-07-12 VITALS — BP 104/71 | HR 78 | Wt 183.8 lb

## 2023-07-12 DIAGNOSIS — R197 Diarrhea, unspecified: Secondary | ICD-10-CM | POA: Diagnosis not present

## 2023-07-12 DIAGNOSIS — R945 Abnormal results of liver function studies: Secondary | ICD-10-CM | POA: Diagnosis not present

## 2023-07-12 DIAGNOSIS — Z7982 Long term (current) use of aspirin: Secondary | ICD-10-CM | POA: Insufficient documentation

## 2023-07-12 DIAGNOSIS — N184 Chronic kidney disease, stage 4 (severe): Secondary | ICD-10-CM

## 2023-07-12 DIAGNOSIS — R569 Unspecified convulsions: Secondary | ICD-10-CM | POA: Insufficient documentation

## 2023-07-12 DIAGNOSIS — Z7901 Long term (current) use of anticoagulants: Secondary | ICD-10-CM | POA: Diagnosis not present

## 2023-07-12 DIAGNOSIS — R404 Transient alteration of awareness: Secondary | ICD-10-CM

## 2023-07-12 LAB — BASIC METABOLIC PANEL
Anion gap: 7 (ref 5–15)
BUN: 13 mg/dL (ref 8–23)
CO2: 19 mmol/L — ABNORMAL LOW (ref 22–32)
Calcium: 8.8 mg/dL — ABNORMAL LOW (ref 8.9–10.3)
Chloride: 112 mmol/L — ABNORMAL HIGH (ref 98–111)
Creatinine, Ser: 1.72 mg/dL — ABNORMAL HIGH (ref 0.44–1.00)
GFR, Estimated: 32 mL/min — ABNORMAL LOW (ref >60)
Glucose, Bld: 92 mg/dL (ref 70–99)
Potassium: 4.1 mmol/L (ref 3.5–5.1)
Sodium: 138 mmol/L (ref 135–145)

## 2023-07-12 LAB — CBC WITH DIFFERENTIAL/PLATELET
Basophils Absolute: 0 10*3/uL (ref 0.0–0.1)
Basophils Relative: 0.9 % (ref 0.0–3.0)
Eosinophils Absolute: 0.2 10*3/uL (ref 0.0–0.7)
Eosinophils Relative: 4.4 % (ref 0.0–5.0)
HCT: 35.7 % — ABNORMAL LOW (ref 36.0–46.0)
Hemoglobin: 11.6 g/dL — ABNORMAL LOW (ref 12.0–15.0)
Lymphocytes Relative: 46.4 % — ABNORMAL HIGH (ref 12.0–46.0)
Lymphs Abs: 1.8 10*3/uL (ref 0.7–4.0)
MCHC: 32.6 g/dL (ref 30.0–36.0)
MCV: 89 fL (ref 78.0–100.0)
Monocytes Absolute: 0.3 10*3/uL (ref 0.1–1.0)
Monocytes Relative: 6.7 % (ref 3.0–12.0)
Neutro Abs: 1.6 10*3/uL (ref 1.4–7.7)
Neutrophils Relative %: 41.6 % — ABNORMAL LOW (ref 43.0–77.0)
Platelets: 374 10*3/uL (ref 150.0–400.0)
RBC: 4.01 Mil/uL (ref 3.87–5.11)
RDW: 13.7 % (ref 11.5–15.5)
WBC: 4 10*3/uL (ref 4.0–10.5)

## 2023-07-12 LAB — CBC
HCT: 35.4 % — ABNORMAL LOW (ref 36.0–46.0)
Hemoglobin: 11.4 g/dL — ABNORMAL LOW (ref 12.0–15.0)
MCH: 28.5 pg (ref 26.0–34.0)
MCHC: 32.2 g/dL (ref 30.0–36.0)
MCV: 88.5 fL (ref 80.0–100.0)
Platelets: 368 10*3/uL (ref 150–400)
RBC: 4 MIL/uL (ref 3.87–5.11)
RDW: 13.3 % (ref 11.5–15.5)
WBC: 4.2 10*3/uL (ref 4.0–10.5)
nRBC: 0 % (ref 0.0–0.2)

## 2023-07-12 LAB — COMPREHENSIVE METABOLIC PANEL
ALT: 10 U/L (ref 0–35)
AST: 16 U/L (ref 0–37)
Albumin: 3.7 g/dL (ref 3.5–5.2)
Alkaline Phosphatase: 178 U/L — ABNORMAL HIGH (ref 39–117)
BUN: 14 mg/dL (ref 6–23)
CO2: 23 meq/L (ref 19–32)
Calcium: 8.6 mg/dL (ref 8.4–10.5)
Chloride: 112 meq/L (ref 96–112)
Creatinine, Ser: 1.66 mg/dL — ABNORMAL HIGH (ref 0.40–1.20)
GFR: 31.41 mL/min — ABNORMAL LOW (ref >60.00)
Glucose, Bld: 84 mg/dL (ref 70–99)
Potassium: 4.4 meq/L (ref 3.5–5.1)
Sodium: 141 meq/L (ref 135–145)
Total Bilirubin: 0.4 mg/dL (ref 0.2–1.2)
Total Protein: 6.1 g/dL (ref 6.0–8.3)

## 2023-07-12 LAB — MAGNESIUM: Magnesium: 2 mg/dL (ref 1.7–2.4)

## 2023-07-12 LAB — CBG MONITORING, ED: Glucose-Capillary: 89 mg/dL (ref 70–99)

## 2023-07-12 MED ORDER — SODIUM CHLORIDE 0.9 % IV SOLN
2000.0000 mg | Freq: Once | INTRAVENOUS | Status: AC
  Administered 2023-07-12: 2000 mg via INTRAVENOUS
  Filled 2023-07-12: qty 20

## 2023-07-12 NOTE — Discharge Instructions (Signed)
 Take seizure medication as previously discussed with your primary doctor today. Return for persistent vomiting, change in mental status, persistent seizures or new concerns.

## 2023-07-12 NOTE — Progress Notes (Signed)
 Established Patient Office Visit  Subjective:  Patient ID: Courtney Burke, female    DOB: Aug 16, 1955  Age: 68 y.o. MRN: [id number]  Chief Complaint  Patient presents with   Diarrhea    Diarrhea that started yesterday and stomach pains. Daughter states earlier this week she looks like she may have had a seizure. She was in a daze for a few mins.    Diarrhea     Discussed the use of AI scribe software for clinical note transcription with the patient, who gave verbal consent to proceed.  History of Present Illness   Courtney Burke is a 68 year old female with a history of stroke and seizures who presents with projectile diarrhea and altered mental status. She is accompanied by her daughter, who is her primary caregiver.  She has been experiencing projectile diarrhea since yesterday morning, with approximately three to four episodes of completely liquid stool. She has abdominal pain and has been unable to reach the bathroom in time. No nausea or vomiting is present, and she has not eaten since yesterday morning.  She appears disoriented and drained, which is concerning given her history of stroke. Her daughter notes that during her last stroke, she had diarrhea prior to the event. She has episodes of blank staring where she does not respond to her name being called. There is occasional slurring of speech, but it resolves quickly. Her speech is at baseline despite a history of aphasia. Her daughter is concerned about her hydration status as she has not been eating.  She has a history of seizures following her stroke and is currently on Keppra , which she takes twice daily. During episodes of blank staring, her daughter administered Keppra , after which she was able to respond to questions and perform basic motor tasks. There is a discrepancy in her Topamax  dosage, currently taking 25 mg twice daily instead of the prescribed 100 mg twice daily, which may affect seizure control.  Her  current medications include Keppra , taken twice daily, and Topamax , with the incorrect dosage due to a prescription issue. She does not have her Reglan , which was previously used for nausea and vomiting. Her daughter notes no fevers and normal blood pressure.       Patient Active Problem List   Diagnosis Date Noted   Trigger middle finger of left hand 03/28/2022   Aphasia following cerebrovascular accident (CVA) 03/28/2022   Hypokalemia 11/12/2021   Hypomagnesemia 11/12/2021   Normocytic anemia 11/10/2021   Hyponatremia 11/09/2021   Metabolic acidosis 11/09/2021   Intractable nausea and vomiting 11/08/2021   Paroxysmal atrial fibrillation (HCC) 11/08/2021   History of CVA (cerebrovascular accident) 07/24/2021   Syncope 05/16/2021   Breast lump 05/16/2021   Seizure disorder (HCC) 05/16/2021   CVA (cerebral vascular accident) (HCC) 05/11/2021   Acute cerebral infarction (HCC) 05/10/2021   Dyslipidemia 05/09/2021   Type 2 diabetes mellitus with obesity (HCC) 05/09/2021   Cerebral infarction due to unspecified occlusion or stenosis of left middle cerebral artery (HCC) 06/30/2020   Gallstones 08/18/2019   Esophagitis 07/20/2019   Erosive gastritis 07/20/2019   Nausea vomiting and diarrhea 07/16/2019   AKI (acute kidney injury) (HCC) 07/15/2019   Chronic bilateral thoracic back pain 06/05/2018   Large breasts 06/05/2018   Seasonal allergic rhinitis due to pollen 05/09/2017   Microcalcifications of the breast 07/02/2016   Mild intermittent asthma without complication 05/08/2016   Severe single current episode of major depressive disorder, without psychotic features (HCC) 05/08/2016   Vitamin  D deficiency 05/08/2016   Encounter for health maintenance examination 05/08/2016   Persistent headaches 02/16/2016   Arthralgia of multiple joints 09/28/2015   Age-related nuclear cataract of both eyes 09/28/2015   Asthma 09/28/2015   Essential hypertension 09/28/2015   Essential  thrombocytosis (HCC) 09/28/2015   Gastroesophageal reflux disease without esophagitis 09/28/2015   Keratoconjunctivitis sicca of both eyes not specified as Sjogren's 09/28/2015   Status post gastric bypass for obesity 09/28/2015   Stage 3b chronic kidney disease (HCC) 09/28/2015   Esophageal dysphagia 09/28/2015   Hot flashes 09/28/2015   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   IFG (impaired fasting glucose) 09/28/2015   Menopausal disorder 09/28/2015   Morbid obesity with BMI of 40.0-44.9, adult (HCC) 09/28/2015   Non-smoker 09/28/2015   Pelvic pain in female 09/28/2015   Presbyopia of both eyes 09/28/2015   Snoring 09/28/2015   Tinea corporis 09/28/2015   Osteopenia 07/15/2015   Benign hypertension with CKD (chronic kidney disease) stage III (HCC) 08/11/2014   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   Hyperlipidemia 03/16/2013   Past Medical History:  Diagnosis Date   Acute ischemic left MCA stroke (HCC) 06/30/2020   Acute ischemic stroke (HCC)    Acute stroke due to occlusion of left middle cerebral artery (HCC) 06/30/2020   Age-related nuclear cataract of both eyes 09/28/2015   Arthralgia of multiple joints 09/28/2015   Asthma    Borderline diabetes    Cerebrovascular accident (CVA) (HCC)    Chronic bilateral thoracic back pain 06/05/2018   Chronic renal insufficiency, stage 1 08/11/2014   Diabetes mellitus type 2 in obese    Dyslipidemia    Dysphagia, post-stroke    Erosive gastritis 07/20/2019   Formatting of this note might be different from the original. On EGD 07/2019   Esophagitis 07/20/2019   Formatting of this note might be different from the original. Added automatically from request for surgery [id number]  Formatting of this note might be different from the original. On EGD 07/2019   Essential hypertension 09/28/2015   Essential thrombocytosis (HCC) 09/28/2015   Gallstones 08/18/2019   Formatting of this note might be different from the original. Added automatically  from request for surgery [id number]   Gastroesophageal reflux disease without esophagitis 09/28/2015   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   Hyperlipidemia, unspecified 03/16/2013   Hypertension    Keratoconjunctivitis sicca of both eyes not specified as Sjogren's 09/28/2015   Left middle cerebral artery stroke (HCC) 07/09/2020   Morbid obesity (HCC) 05/21/2013   Seizures (HCC)    Stage 3b chronic kidney disease (HCC)    Status post gastric bypass for obesity 09/28/2015   Past Surgical History:  Procedure Laterality Date   ANKLE SURGERY     CARPAL TUNNEL RELEASE     carpel tunnel     ESOPHAGOGASTRODUODENOSCOPY (EGD) WITH PROPOFOL  N/A 11/14/2021   Procedure: ESOPHAGOGASTRODUODENOSCOPY (EGD) WITH PROPOFOL ;  Surgeon: Avram Lupita BRAVO, MD;  Location: [facility] ENDOSCOPY;  Service: Gastroenterology;  Laterality: N/A;   IR ANGIO INTRA EXTRACRAN SEL COM CAROTID INNOMINATE BILAT MOD SED  01/17/2021   IR ANGIO VERTEBRAL SEL SUBCLAVIAN INNOMINATE UNI R MOD SED  01/17/2021   IR CT HEAD LTD  06/30/2020   IR INTRA CRAN STENT  06/30/2020   IR PERCUTANEOUS ART THROMBECTOMY/INFUSION INTRACRANIAL INC DIAG ANGIO  06/30/2020   IR RADIOLOGIST EVAL & MGMT  08/26/2020   IR US  GUIDE VASC ACCESS RIGHT  01/17/2021   RADIOLOGY WITH ANESTHESIA N/A 06/30/2020   Procedure: RADIOLOGY WITH ANESTHESIA;  Surgeon:  Radiologist, Medication, MD;  Location: [facility] OR;  Service: Radiology;  Laterality: N/A;   Social History   Tobacco Use   Smoking status: Never   Smokeless tobacco: Never  Vaping Use   Vaping status: Never Used  Substance Use Topics   Alcohol use: No   Drug use: No      ROS: as noted in HPI  Objective:     BP 104/71   Pulse 78   Wt 183 lb 12.8 oz (83.4 kg)   LMP 09/28/2012   SpO2 97%   BMI 33.62 kg/m  BP Readings from Last 3 Encounters:  07/12/23 104/71  05/15/23 122/70  04/09/23 110/72   Wt Readings from Last 3 Encounters:  07/12/23 183 lb 12.8 oz (83.4 kg)  05/15/23 185 lb 12.8 oz (84.3 kg)   04/09/23 186 lb 12.8 oz (84.7 kg)      Physical Exam Vitals and nursing note reviewed. Exam conducted with a chaperone present.  Constitutional:      Appearance: Normal appearance. She is normal weight.     Comments: Pt was alert at first evaluation, however had a transient episode of altered mentation to where she was unable to respond to verbal or pain stimuli for roughly 2 minutes  HENT:     Head: Normocephalic and atraumatic.     Mouth/Throat:     Mouth: Mucous membranes are moist.     Pharynx: Oropharynx is clear. No oropharyngeal exudate.     Comments: Pt with baseline aphasia, intermittently worse during encounter; one episode of inability to communicate x 2 minutes Cardiovascular:     Rate and Rhythm: Normal rate.  Pulmonary:     Effort: Pulmonary effort is normal.     Breath sounds: Normal breath sounds.  Abdominal:     General: Abdomen is flat. Bowel sounds are normal. There is no distension.     Palpations: Abdomen is soft.     Tenderness: There is abdominal tenderness (mild, generalized). There is no right CVA tenderness, left CVA tenderness, guarding or rebound.     Comments: Percussion tympanic to R side, dullness to L  Skin:    General: Skin is warm and dry.     Findings: No erythema or rash.  Neurological:     Cranial Nerves: No cranial nerve deficit.     Sensory: Sensory deficit present.     Motor: Weakness present.     Gait: Gait abnormal.  Psychiatric:     Comments: Confusion/ altered level of consciousness noted transiently      No results found for any visits on 07/12/23.  Last CBC Lab Results  Component Value Date   WBC 4.5 03/06/2023   HGB 11.7 03/06/2023   HCT 37.9 03/06/2023   MCV 91 03/06/2023   MCH 27.9 03/06/2023   RDW 12.9 03/06/2023   PLT 416 03/06/2023   Last metabolic panel Lab Results  Component Value Date   GLUCOSE 65 (L) 03/06/2023   NA 144 03/06/2023   K 4.9 03/06/2023   CL 111 (H) 03/06/2023   CO2 18 (L) 03/06/2023    BUN 19 03/06/2023   CREATININE 1.72 (H) 03/06/2023   EGFR 32 (L) 03/06/2023   CALCIUM  8.9 03/06/2023   PHOS 2.3 (L) 11/15/2021   PROT 6.4 03/06/2023   ALBUMIN 3.8 (L) 03/06/2023   LABGLOB 2.6 03/06/2023   AGRATIO 1.5 08/23/2022   BILITOT 0.2 03/06/2023   ALKPHOS 191 (H) 03/06/2023   AST 24 03/06/2023   ALT 12 03/06/2023  ANIONGAP 4 (L) 11/15/2021      The ASCVD Risk score (Arnett DK, et al., 2019) failed to calculate for the following reasons:   Risk score cannot be calculated because patient has a medical history suggesting prior/existing ASCVD  Assessment & Plan:  Chronic kidney disease, stage IV (severe) (HCC) -     CBC with Differential/Platelet -     Comprehensive metabolic panel  Diarrhea, unspecified type -     CBC with Differential/Platelet -     Comprehensive metabolic panel  Abnormal liver function -     CBC with Differential/Platelet -     Comprehensive metabolic panel    Assessment and Plan    Acute Gastroenteritis Sudden onset of projectile diarrhea and abdominal pain. No vomiting. Possible viral etiology (norovirus or adenovirus). Concern for dehydration and electrolyte imbalance. -Encourage hydration with water , Gatorade, and Pedialyte. -Avoid antidiarrheal medication. -Consider Zofran  for nausea if needed. -Obtain blood work to assess kidney function and electrolyte status.  Seizure Disorder History of seizures post-stroke. On Keppra  and topamax , however upon review pt has been prescribed 200mg  daily and has only been receiving 50mg  daily of a bottle filled in January of 2024. Noted episode in office of unresponsiveness, possibly absence seizure vs acute CVA based on prior presentation. -Continue Keppra  as prescribed. -Will send pt to ER via EMS given her altered level of conciousness for stroke vs seizure workup  Medication Management Noted discrepancy in Topamax  dosage (patient taking 25mg  twice daily, prescribed 100mg  twice daily). -Clarify and  correct Topamax  dosage.  Stroke History Last stroke in December 2022. Noted episodes of profuse sweating prior to stroke. -Regular follow-up with neurologist [facility]). -Pt concerned as current presenting symptoms are identical to her stroke in 2022 - will send to ER for further assessment.   -EMS contacted for transport -Plan for hospital evaluation to assess for new seizure activity, possible infection, and to manage acute gastroenteritis.       No follow-ups on file.   Benton LITTIE Gave, PA

## 2023-07-12 NOTE — ED Triage Notes (Signed)
 Patient via EMS from PCP. While at the PCP for eval of diarrhea, she had a two minute absent seizure. Patient taking 1/4 the amount of Keppra  she is prescribed. Hx CVA and has expressive aphasia and shuffling gait at baseline. Daughter on scene states she is back to her baseline.

## 2023-07-12 NOTE — Telephone Encounter (Signed)
 Chief Complaint: Diarrhea Symptoms: Fatigue Frequency: 3-4 watery loose stools Pertinent Negatives: Patient denies blood in stool, vomiting Disposition: [] ED /[] Urgent Care (no appt availability in office) / [x] Appointment(In office/virtual)/ []  [facility]/ [] Home Care/ [] Refused Recommended Disposition /[] [facility]/ []  Follow-up with PCP Additional Notes:  Spoke with pt daughter, [name]. Pt had a stroke x3 years ago. Pt having diarrhea starting yesterday. Pt had chinese takeout. Recent death in family and when discussing pt went intro trance. Pt has hx of seizures and hasn't had one in about a year. Pt daughter is concerned she may have had a seizure yesterday with the trance - gave her keppra . Pt had to pour water  on her to ger her out of trance. Pt checked mobility and neuroglic function after and pt did well. Pt having discomfort with diarrhea. Appt scheduled for today. This RN educated pt daughter on home care, new-worsening symptoms, when to call back/seek emergent care. Pt daughter verbalized understanding and agrees to plan.     Copied from CRM [id number]. Topic: Clinical - Medical Advice >> Jul 12, 2023  9:53 AM Tiffany H wrote: Reason for CRM: Patient's daughter Loretto called to advise patient is having stomach problems. Extreme bowel movements. She has trouble gaging how fast her bowels are moving.  There were two episodes where patient nearly lost control of her bowels. While trying to explain herself to her daughter, patient went silent for a stretch of time. The first time this happened, patient got up and went to the bathroom.   The second time this happened, daughter had to pour some water  on her to snap her out of the trance. Patient's daughter then gave patient some Keppra  to avoid a seizure. Daughter is concerned that patient is having silent seizures.   Recent grief - patient lost mother figure. Mother figure was hospitalized due to stroke but passed  from pneumonia. Daughter concerned that she is having a prolonged emotional response due to her own stroke history.   No urgent symptoms as of time of call. Please follow up with patient regarding recent bowel issues. Reason for Disposition  [1] SEVERE diarrhea (e.g., 7 or more times / day more than normal) AND [2] age > 60 years  Answer Assessment - Initial Assessment Questions 1. DIARRHEA SEVERITY: How bad is the diarrhea? How many more stools have you had in the past 24 hours than normal?    - NO DIARRHEA (SCALE 0)   - MILD (SCALE 1-3): Few loose or mushy BMs; increase of 1-3 stools over normal daily number of stools; mild increase in ostomy output.   -  MODERATE (SCALE 4-7): Increase of 4-6 stools daily over normal; moderate increase in ostomy output.   -  SEVERE (SCALE 8-10; OR WORST POSSIBLE): Increase of 7 or more stools daily over normal; moderate increase in ostomy output; incontinence.     3-4 2. ONSET: When did the diarrhea begin?      Yesterday 3. BM CONSISTENCY: How loose or watery is the diarrhea?      Loose and watery 4. VOMITING: Are you also vomiting? If Yes, ask: How many times in the past 24 hours?      No 5. ABDOMEN PAIN: Are you having any abdomen pain? If Yes, ask: What does it feel like? (e.g., crampy, dull, intermittent, constant)      Yes, middle of stomach under ribcage  6. ABDOMEN PAIN SEVERITY: If present, ask: How bad is the pain?  (e.g., Scale 1-10;  mild, moderate, or severe)   - MILD (1-3): doesn't interfere with normal activities, abdomen soft and not tender to touch    - MODERATE (4-7): interferes with normal activities or awakens from sleep, abdomen tender to touch    - SEVERE (8-10): excruciating pain, doubled over, unable to do any normal activities       Not sure- might be in the middle  7. ORAL INTAKE: If vomiting, Have you been able to drink liquids? How much liquids have you had in the past 24 hours?     Yes, plenty of water   given to her and held down 8. HYDRATION: Any signs of dehydration? (e.g., dry mouth [not just dry lips], too weak to stand, dizziness, new weight loss) When did you last urinate?     Pt doesn't try to  move too much-baseline 10. ANTIBIOTIC USE: Are you taking antibiotics now or have you taken antibiotics in the past 2 months?       Not that I know  Protocols used: [facility]

## 2023-07-12 NOTE — ED Provider Notes (Signed)
 [place] EMERGENCY DEPARTMENT AT [facility] Provider Note   CSN: [id number] Arrival date & time: 07/12/23  1308     History  Chief Complaint  Patient presents with   Seizures    Courtney Burke is a 68 y.o. female.  Patient presents after approximately 2-minute seizure-like activity staring off.  Per report patient is taking quarter of the amount of the Keppra  she is prescribed.  EMS brought the patient in.  Patient has been evaluated by prior doctor for diarrhea.  Patient has history of expressive aphasia, stroke, shuffling gait and per report at baseline.  No fever or infectious symptoms.  Difficulty getting details from patient due to her stroke history.  The history is provided by the patient and the EMS personnel.  Seizures      Home Medications Prior to Admission medications   Medication Sig Start Date End Date Taking? Authorizing Provider  acetaminophen  (TYLENOL ) 500 MG tablet Take 1,000 mg by mouth every 6 (six) hours as needed for moderate pain or headache.    [provider]  amitriptyline  (ELAVIL ) 50 MG tablet Take 1 tablet (50 mg total) by mouth at bedtime. 03/06/23   Whitfield Raisin, NP  amLODipine  (NORVASC ) 10 MG tablet Take 1 tablet by mouth daily. 11/23/22   [provider]  apixaban  (ELIQUIS ) 5 MG TABS tablet TAKE 1 TABLET BY MOUTH TWICE A DAY 06/03/23   Camnitz, Soyla Lunger, MD  aspirin  EC 81 MG tablet Take 1 tablet (81 mg total) by mouth daily. Swallow whole. 06/29/21   Whitfield Raisin, NP  atorvastatin  (LIPITOR ) 80 MG tablet Take 1 tablet (80 mg total) by mouth daily. 10/01/22   [name], MD  cholecalciferol  (VITAMIN D3) 25 MCG (1000 UNIT) tablet Take by mouth. 09/30/20   [provider]  KLOR-CON  M20 20 MEQ tablet Take 40 mEq by mouth 2 (two) times daily. 05/16/21   [provider]  levETIRAcetam  (KEPPRA ) 750 MG tablet Take 1 tablet (750 mg total) by mouth 2 (two) times daily. 03/06/23   Whitfield Raisin, NP   metoCLOPramide  (REGLAN ) 5 MG tablet Take 1 tablet (5 mg total) by mouth every 8 (eight) hours as needed for up to 10 days for nausea or vomiting. 11/15/21 11/30/21  Gonfa, Taye T, MD  Multiple Vitamin (MULTIVITAMIN WITH MINERALS) TABS tablet Take 1 tablet by mouth daily.    [provider]  omeprazole (PRILOSEC) 40 MG capsule Take 1 tablet by mouth daily. 07/28/20   [provider]  ondansetron  (ZOFRAN -ODT) 4 MG disintegrating tablet Take by mouth. 06/01/21   [provider]  pantoprazole  (PROTONIX ) 40 MG tablet Take 40 mg by mouth daily.    [provider]  Rimegepant Sulfate (NURTEC) 75 MG TBDP Take 1 tablet (75 mg total) by mouth as needed. 08/23/22   Whitfield Raisin, NP  sertraline  (ZOLOFT ) 25 MG tablet Take 1 tablet (25 mg total) by mouth daily. 02/13/23   [name], MD  topiramate  (TOPAMAX ) 100 MG tablet Take 1 tablet (100 mg total) by mouth 2 (two) times daily. Take 1 tablet twice daily 03/06/23   Whitfield Raisin, NP  ursodiol  (ACTIGALL ) 250 MG tablet Take 250 mg by mouth 2 (two) times daily. 07/28/20   [provider]  Vitamin D , Ergocalciferol , (DRISDOL ) 1.25 MG (50000 UNIT) CAPS capsule Take 1 capsule (50,000 Units total) by mouth every 7 (seven) days. 03/28/22   [name], MD      Allergies    Homatropine, Iodinated  contrast media, Doxycycline, Sulfa antibiotics, Codeine, Hydrocodone, Hydrocodone bit-homatrop mbr, Sulfamethoxazole, and Ace inhibitors    Review of Systems   Review of Systems  Constitutional:  Negative for chills and fever.  HENT:  Negative for congestion.   Eyes:  Negative for visual disturbance.  Respiratory:  Negative for shortness of breath.   Cardiovascular:  Negative for chest pain.  Gastrointestinal:  Negative for abdominal pain and vomiting.  Genitourinary:  Negative for dysuria and flank pain.  Musculoskeletal:  Negative for back pain, neck pain and neck stiffness.  Skin:  Negative for rash.   Neurological:  Positive for seizures. Negative for light-headedness and headaches.    Physical Exam Updated Vital Signs BP (!) 153/51   Pulse 70   Temp (!) 97.5 F (36.4 C)   Resp 18   LMP 09/28/2012   SpO2 94%  Physical Exam Vitals and nursing note reviewed.  Constitutional:      General: She is not in acute distress.    Appearance: She is well-developed.  HENT:     Head: Normocephalic and atraumatic.     Mouth/Throat:     Mouth: Mucous membranes are moist.  Eyes:     General:        Right eye: No discharge.        Left eye: No discharge.     Conjunctiva/sclera: Conjunctivae normal.  Neck:     Trachea: No tracheal deviation.  Cardiovascular:     Rate and Rhythm: Normal rate and regular rhythm.     Heart sounds: No murmur heard. Pulmonary:     Effort: Pulmonary effort is normal.     Breath sounds: Normal breath sounds.  Abdominal:     General: There is no distension.     Palpations: Abdomen is soft.     Tenderness: There is no abdominal tenderness. There is no guarding.  Musculoskeletal:     Cervical back: Normal range of motion and neck supple. No rigidity.  Skin:    General: Skin is warm.     Capillary Refill: Capillary refill takes less than 2 seconds.     Findings: No rash.  Neurological:     General: No focal deficit present.     Mental Status: She is alert.     Comments: Expressive aphasia patient can lift arms equal bilateral, finger-nose bilateral with mild dysmetria.  Equal leg strength bilateral.  Gross sensation intact palpation, extraocular muscle function intact pupils equal.  Expressive aphasia.  Psychiatric:        Mood and Affect: Mood normal.     ED Results / Procedures / Treatments   Labs (all labs ordered are listed, but only abnormal results are displayed) Labs Reviewed  BASIC METABOLIC PANEL - Abnormal; Notable for the following components:      Result Value   Chloride 112 (*)    CO2 19 (*)    Creatinine, Ser 1.72 (*)    Calcium  8.8  (*)    GFR, Estimated 32 (*)    All other components within normal limits  CBC - Abnormal; Notable for the following components:   Hemoglobin 11.4 (*)    HCT 35.4 (*)    All other components within normal limits  MAGNESIUM   CBG MONITORING, ED    EKG None  Radiology CT Head Wo Contrast Result Date: 07/12/2023 CLINICAL DATA:  Seizure, new onset, no history of trauma EXAM: CT HEAD WITHOUT CONTRAST TECHNIQUE: Contiguous axial images were obtained from the base of the skull through the  vertex without intravenous contrast. RADIATION DOSE REDUCTION: This exam was performed according to the departmental dose-optimization program which includes automated exposure control, adjustment of the mA and/or kV according to patient size and/or use of iterative reconstruction technique. COMPARISON:  12/29/2021 FINDINGS: Brain: No evidence of acute infarction, hemorrhage, mass, mass effect, or midline shift. No hydrocephalus or extra-axial fluid collection. Redemonstrated remote infarcts in the left frontal, temporal, occipital, and parietal lobes and left basal ganglia, as well as the right posterior temporal and occipital lobes and right cerebellum. Ex vacuo dilatation of the left lateral ventricle. Vascular: No hyperdense vessel. Left MCA stent. Atherosclerotic calcifications in the intracranial carotid and vertebral arteries. Skull: Negative for fracture or focal lesion. Sinuses/Orbits: No acute finding. Other: The mastoid air cells are well aerated. IMPRESSION: No acute intracranial process. Electronically Signed   By: Donald Campion M.D.   On: 07/12/2023 18:11    Procedures Procedures    Medications Ordered in ED Medications  levETIRAcetam  (KEPPRA ) 2,000 mg in sodium chloride  0.9 % 250 mL IVPB (0 mg Intravenous Stopped 07/12/23 2035)    ED Course/ Medical Decision Making/ A&P                                 Medical Decision Making Amount and/or Complexity of Data Reviewed Labs: ordered. Radiology:  ordered.   Patient with history of stroke, on seizure medicines reportedly not adequate dose presents after witnessed seizure-like activity.  Patient at baseline clinically with expressive aphasia. Blood work reviewed independently normal glucose, creatinine at baseline 1.7, normal hemoglobin.  Spoke with daughter on the phone and then met her at bedside.  Patient per report with primary doctor has not been taking Depakote adequately was only taking 25 mg instead of 100 mg.  Patient has been taking Keppra .  Patient's had few seizures recently which is abnormal.  No head injuries.  On recheck patient well-appearing at baseline.  No further seizure activity.  Discussed with neurology recommended loading Keppra  and following up with primary doctor taking appropriate dose of medications.       Final Clinical Impression(s) / ED Diagnoses Final diagnoses:  Seizure [facility])    Rx / DC Orders ED Discharge Orders     None         Tonia Chew, MD 07/12/23 2042

## 2023-07-18 ENCOUNTER — Other Ambulatory Visit (HOSPITAL_COMMUNITY): Payer: Self-pay

## 2023-07-18 ENCOUNTER — Telehealth: Payer: Self-pay | Admitting: Pharmacy Technician

## 2023-07-18 NOTE — Telephone Encounter (Signed)
Pharmacy Patient Advocate Encounter   Received notification from CoverMyMeds that prior authorization for Nurtec 75MG  dispersible tablets is required/requested.   Insurance verification completed.   The patient is insured through [facility] .   Per test claim: The current 30 day co-pay is, $65.00.  No PA needed at this time. This test claim was processed through [facility]- copay amounts may vary at other pharmacies due to pharmacy/plan contracts, or as the patient moves through the different stages of their insurance plan.

## 2023-08-07 ENCOUNTER — Telehealth: Payer: Self-pay | Admitting: Cardiology

## 2023-08-07 NOTE — Telephone Encounter (Signed)
   Pre-operative Risk Assessment    Patient Name: Courtney Burke  DOB: 09/28/1955 MRN: [id number]   Date of last office visit:  Date of next office visit:    Request for Surgical Clearance    Procedure:  Right long and ring trigger release  Date of Surgery:  Clearance 09-09-23                                Surgeon:  Dr Allegra Lai Surgeon's Group or Practice Name:   Phone number:  [phone] Fax number:  [phone]   Type of Clearance Requested:   - Pharmacy:  Hold Aspirin how many day need to hold and Eliquis also   Type of Anesthesia:    Additional requests/questions:    Rivka Safer   08/07/2023, 2:23 PM

## 2023-08-07 NOTE — Telephone Encounter (Signed)
   Pre-operative Risk Assessment    Patient Name: Courtney Burke  DOB: June 22, 1955 MRN: [id number]   Date of last office visit: 04/09/23 w/ Luster Landsberg, PA  Date of next office visit: not due until November 2025    Request for Surgical Clearance    Procedure:   Right long and ring trigger releases  Date of Surgery:  Clearance 09/09/23                                Surgeon:  Francee Gentile   Surgeon's Group or Practice Name:  [facility]  Phone number:  [phone]  Fax number:  [phone]    Type of Clearance Requested:   - Medical  - Pharmacy:  Hold Apixaban (Eliquis)     Type of Anesthesia:  General    Additional requests/questions:    Alben Spittle   08/07/2023, 2:16 PM

## 2023-08-08 ENCOUNTER — Telehealth: Payer: Self-pay

## 2023-08-08 NOTE — Telephone Encounter (Signed)
 Patient with diagnosis of PAF on Eliquis for anticoagulation.    Procedure: Right long and ring trigger release  Date of procedure: 09/09/2023   CHA2DS2-VASc Score = 6   This indicates a 9.7% annual risk of stroke. The patient's score is based upon: CHF History: 0 HTN History: 1 Diabetes History: 1 Stroke History: 2 (06/2020) Vascular Disease History: 0 Age Score: 1 Gender Score: 1   CrCl 40 mL/min Platelet count 368 K   Per office protocol, patient can hold Eliquis for 2 days prior to procedure.     **This guidance is not considered finalized until pre-operative APP has relayed final recommendations.**

## 2023-08-08 NOTE — Telephone Encounter (Signed)
 Brenda from [facility] would like to clarify if this is for medical clearance as well so she'd like a callback. Please advise

## 2023-08-08 NOTE — Telephone Encounter (Signed)
 Pharmacy please advise on holding Eliquis prior to long and ring trigger release scheduled 09/09/23. Thank you.

## 2023-08-08 NOTE — Telephone Encounter (Signed)
   Patient Name: Courtney Burke  DOB: 16-Nov-1955 MRN: [id number]  Primary Cardiologist: None  Clinical pharmacists have reviewed the patient's past medical history, labs, and current medications as part of preoperative protocol coverage. The following recommendations have been made:   Per office protocol, patient can hold Eliquis for 2 days prior to procedure.     I will route this recommendation to the requesting party via Epic fax function and remove from pre-op pool.  Please call with questions.  Napoleon Form, Leodis Rains, NP 08/08/2023, 1:27 PM

## 2023-08-08 NOTE — Telephone Encounter (Signed)
 Preop televisit now scheduled, med rec and consent done.

## 2023-08-08 NOTE — Telephone Encounter (Signed)
   Name: Courtney Burke  DOB: [birth year]  MRN: [id number]  Primary Cardiologist: None   Preoperative team, please contact this patient and set up a phone call appointment for further preoperative risk assessment. Please obtain consent and complete medication review. Thank you for your help.  Please contact requesting provider's office and make them aware that recent clearance request was only for pharmacy clearance.  I confirm that guidance regarding antiplatelet and oral anticoagulation therapy has been completed and, if necessary, noted below.  Per office protocol, patient can hold Eliquis for 2 days prior to procedure.     I also confirmed the patient resides in the state of [place]. As per [facility] Medical Board telemedicine laws, the patient must reside in the state in which the provider is licensed.   Napoleon Form, Leodis Rains, NP 08/08/2023, 2:48 PM [facility]

## 2023-08-08 NOTE — Telephone Encounter (Signed)
  Patient Consent for Virtual Visit        Courtney Burke has provided verbal consent on 08/08/2023 for a virtual visit (video or telephone).   CONSENT FOR VIRTUAL VISIT FOR:  Courtney Burke  By participating in this virtual visit I agree to the following:  I hereby voluntarily request, consent and authorize [facility] and its employed or contracted physicians, physician assistants, nurse practitioners or other licensed health care professionals (the Practitioner), to provide me with telemedicine health care services (the "Services") as deemed necessary by the treating Practitioner. I acknowledge and consent to receive the Services by the Practitioner via telemedicine. I understand that the telemedicine visit will involve communicating with the Practitioner through live audiovisual communication technology and the disclosure of certain medical information by electronic transmission. I acknowledge that I have been given the opportunity to request an in-person assessment or other available alternative prior to the telemedicine visit and am voluntarily participating in the telemedicine visit.  I understand that I have the right to withhold or withdraw my consent to the use of telemedicine in the course of my care at any time, without affecting my right to future care or treatment, and that the Practitioner or I may terminate the telemedicine visit at any time. I understand that I have the right to inspect all information obtained and/or recorded in the course of the telemedicine visit and may receive copies of available information for a reasonable fee.  I understand that some of the potential risks of receiving the Services via telemedicine include:  Delay or interruption in medical evaluation due to technological equipment failure or disruption; Information transmitted may not be sufficient (e.g. poor resolution of images) to allow for appropriate medical decision making by the  Practitioner; and/or  In rare instances, security protocols could fail, causing a breach of personal health information.  Furthermore, I acknowledge that it is my responsibility to provide information about my medical history, conditions and care that is complete and accurate to the best of my ability. I acknowledge that Practitioner's advice, recommendations, and/or decision may be based on factors not within their control, such as incomplete or inaccurate data provided by me or distortions of diagnostic images or specimens that may result from electronic transmissions. I understand that the practice of medicine is not an exact science and that Practitioner makes no warranties or guarantees regarding treatment outcomes. I acknowledge that a copy of this consent can be made available to me via my patient portal [facility] MyChart), or I can request a printed copy by calling the office of  [facility].    I understand that my insurance will be billed for this visit.   I have read or had this consent read to me. I understand the contents of this consent, which adequately explains the benefits and risks of the Services being provided via telemedicine.  I have been provided ample opportunity to ask questions regarding this consent and the Services and have had my questions answered to my satisfaction. I give my informed consent for the services to be provided through the use of telemedicine in my medical care

## 2023-08-12 ENCOUNTER — Ambulatory Visit: Payer: Medicare Other

## 2023-08-12 DIAGNOSIS — I639 Cerebral infarction, unspecified: Secondary | ICD-10-CM

## 2023-08-12 LAB — CUP PACEART REMOTE DEVICE CHECK
Date Time Interrogation Session: 20250309230557
Implantable Pulse Generator Implant Date: 20220407

## 2023-08-13 ENCOUNTER — Telehealth: Payer: Self-pay | Admitting: Neurology

## 2023-08-13 NOTE — Telephone Encounter (Signed)
 Received a surgical clearance form for the pt to have a right ring finger trigger release surgery scheduled for 09/09/23. Stroke was recorded in 2022. Pt also has a hx of seizures and last reported seizure was recorded to be on 2/7 when the pt was last seen in the ER. We also see and treat her for headaches.  Pt is on asa and eliquis. The eliquis is prescribed by the cardiology team.  Will place in Jesssica's office for her to review and sign for the pt.

## 2023-08-13 NOTE — Telephone Encounter (Signed)
 I have sent the clearance form in and received confirmation that it went through.

## 2023-08-13 NOTE — Telephone Encounter (Signed)
 Cleared to hold aspirin for 3 to 5 days prior to procedure but small acceptable risk of recurrent stroke while off therapy and recommend starting immediately after or once stable.  Holding of Eliquis will need to be determined by cardiology.  She should still take her morning dose of antiseizure medications prior to surgery with small sip of water.  Clearance form signed and placed in outbox.

## 2023-08-13 NOTE — Progress Notes (Signed)
 [facility] Summary Report / Loop Recorder

## 2023-08-14 ENCOUNTER — Ambulatory Visit: Payer: Medicare Other | Admitting: Family Medicine

## 2023-08-14 ENCOUNTER — Encounter: Payer: Self-pay | Admitting: Family Medicine

## 2023-08-14 VITALS — BP 110/80 | HR 78 | Temp 97.5°F | Ht 62.0 in | Wt 179.8 lb

## 2023-08-14 DIAGNOSIS — E1169 Type 2 diabetes mellitus with other specified complication: Secondary | ICD-10-CM

## 2023-08-14 DIAGNOSIS — R4701 Aphasia: Secondary | ICD-10-CM

## 2023-08-14 DIAGNOSIS — E785 Hyperlipidemia, unspecified: Secondary | ICD-10-CM

## 2023-08-14 DIAGNOSIS — I10 Essential (primary) hypertension: Secondary | ICD-10-CM | POA: Diagnosis not present

## 2023-08-14 DIAGNOSIS — R635 Abnormal weight gain: Secondary | ICD-10-CM

## 2023-08-14 DIAGNOSIS — Z1211 Encounter for screening for malignant neoplasm of colon: Secondary | ICD-10-CM

## 2023-08-14 DIAGNOSIS — G40909 Epilepsy, unspecified, not intractable, without status epilepticus: Secondary | ICD-10-CM

## 2023-08-14 DIAGNOSIS — N184 Chronic kidney disease, stage 4 (severe): Secondary | ICD-10-CM

## 2023-08-14 DIAGNOSIS — G43909 Migraine, unspecified, not intractable, without status migrainosus: Secondary | ICD-10-CM

## 2023-08-14 DIAGNOSIS — Z13820 Encounter for screening for osteoporosis: Secondary | ICD-10-CM

## 2023-08-14 DIAGNOSIS — E559 Vitamin D deficiency, unspecified: Secondary | ICD-10-CM

## 2023-08-14 LAB — MICROALBUMIN / CREATININE URINE RATIO
Creatinine,U: 182.6 mg/dL
Microalb Creat Ratio: 14.2 mg/g (ref 0.0–30.0)
Microalb, Ur: 2.6 mg/dL — ABNORMAL HIGH (ref 0.0–1.9)

## 2023-08-14 MED ORDER — TOPIRAMATE 100 MG PO TABS: 100.0000 mg | ORAL_TABLET | Freq: Two times a day (BID) | ORAL | 1 refills | Status: DC

## 2023-08-14 MED ORDER — AMLODIPINE BESYLATE 10 MG PO TABS: 10.0000 mg | ORAL_TABLET | Freq: Every day | ORAL | 3 refills | Status: DC

## 2023-08-14 MED ORDER — ATORVASTATIN CALCIUM 80 MG PO TABS: 80.0000 mg | ORAL_TABLET | Freq: Every day | ORAL | 3 refills | Status: AC

## 2023-08-14 NOTE — Progress Notes (Signed)
 Established Patient Office Visit   Subjective  Patient ID: Courtney Burke, female    DOB: 06-16-1955  Age: 68 y.o. MRN: [id number]  Chief Complaint  Patient presents with   Medical Management of Chronic Issues    3 month follow-up for BP, mood changes, aphasia, headaches and hand surg   Pt accompanied by her daughter.  Patient is a 67 year old female seen for follow-up.  Pt had a seizure during recent office visit with another provider for acute issue.  Pt sent to ED.  Discovered pt was taking incorrect dose of topiramate.  Now on correct dose, 100 mg.  Pt's seizures are noted as staring spells.  Was having them more frequently at home prior to the visit.  Endorses HAs in the last wk.  Tyelnol helps.  Pt has lost some wt since last OFV.  Interested in having screenings scheduled for mammogram and colonoscopy.  Pt's speech has been improving, more intelligible.      Patient Active Problem List   Diagnosis Date Noted   Trigger middle finger of left hand 03/28/2022   Aphasia following cerebrovascular accident (CVA) 03/28/2022   Hypokalemia 11/12/2021   Hypomagnesemia 11/12/2021   Normocytic anemia 11/10/2021   Hyponatremia 11/09/2021   Metabolic acidosis 11/09/2021   Intractable nausea and vomiting 11/08/2021   Paroxysmal atrial fibrillation (HCC) 11/08/2021   History of CVA (cerebrovascular accident) 07/24/2021   Syncope 05/16/2021   Breast lump 05/16/2021   Seizure disorder (HCC) 05/16/2021   CVA (cerebral vascular accident) (HCC) 05/11/2021   Acute cerebral infarction (HCC) 05/10/2021   Dyslipidemia 05/09/2021   Type 2 diabetes mellitus with obesity (HCC) 05/09/2021   Cerebral infarction due to unspecified occlusion or stenosis of left middle cerebral artery (HCC) 06/30/2020   Gallstones 08/18/2019   Esophagitis 07/20/2019   Erosive gastritis 07/20/2019   Nausea vomiting and diarrhea 07/16/2019   AKI (acute kidney injury) (HCC) 07/15/2019   Chronic bilateral thoracic  back pain 06/05/2018   Large breasts 06/05/2018   Seasonal allergic rhinitis due to pollen 05/09/2017   Microcalcifications of the breast 07/02/2016   Mild intermittent asthma without complication 05/08/2016   Severe single current episode of major depressive disorder, without psychotic features (HCC) 05/08/2016   Vitamin D deficiency 05/08/2016   Encounter for health maintenance examination 05/08/2016   Persistent headaches 02/16/2016   Arthralgia of multiple joints 09/28/2015   Age-related nuclear cataract of both eyes 09/28/2015   Asthma 09/28/2015   Essential hypertension 09/28/2015   Essential thrombocytosis (HCC) 09/28/2015   Gastroesophageal reflux disease without esophagitis 09/28/2015   Keratoconjunctivitis sicca of both eyes not specified as Sjogren's 09/28/2015   Status post gastric bypass for obesity 09/28/2015   Stage 3b chronic kidney disease (HCC) 09/28/2015   Esophageal dysphagia 09/28/2015   Hot flashes 09/28/2015   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   IFG (impaired fasting glucose) 09/28/2015   Menopausal disorder 09/28/2015   Morbid obesity with BMI of 40.0-44.9, adult (HCC) 09/28/2015   Non-smoker 09/28/2015   Pelvic pain in female 09/28/2015   Presbyopia of both eyes 09/28/2015   Snoring 09/28/2015   Tinea corporis 09/28/2015   Osteopenia 07/15/2015   Benign hypertension with CKD (chronic kidney disease) stage III (HCC) 08/11/2014   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   Hyperlipidemia 03/16/2013   Past Medical History:  Diagnosis Date   Acute ischemic left MCA stroke (HCC) 06/30/2020   Acute ischemic stroke (HCC)    Acute stroke due to occlusion of left middle cerebral artery (HCC)  06/30/2020   Age-related nuclear cataract of both eyes 09/28/2015   Arthralgia of multiple joints 09/28/2015   Asthma    Borderline diabetes    Cerebrovascular accident (CVA) (HCC)    Chronic bilateral thoracic back pain 06/05/2018   Chronic renal insufficiency, stage  1 08/11/2014   Diabetes mellitus type 2 in obese    Dyslipidemia    Dysphagia, post-stroke    Erosive gastritis 07/20/2019   Formatting of this note might be different from the original. On EGD 07/2019   Esophagitis 07/20/2019   Formatting of this note might be different from the original. Added automatically from request for surgery [id number]  Formatting of this note might be different from the original. On EGD 07/2019   Essential hypertension 09/28/2015   Essential thrombocytosis (HCC) 09/28/2015   Gallstones 08/18/2019   Formatting of this note might be different from the original. Added automatically from request for surgery [id number]   Gastroesophageal reflux disease without esophagitis 09/28/2015   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   Hyperlipidemia, unspecified 03/16/2013   Hypertension    Keratoconjunctivitis sicca of both eyes not specified as Sjogren's 09/28/2015   Left middle cerebral artery stroke (HCC) 07/09/2020   Morbid obesity (HCC) 05/21/2013   Seizures (HCC)    Stage 3b chronic kidney disease (HCC)    Status post gastric bypass for obesity 09/28/2015   Past Surgical History:  Procedure Laterality Date   ANKLE SURGERY     CARPAL TUNNEL RELEASE     carpel tunnel     ESOPHAGOGASTRODUODENOSCOPY (EGD) WITH PROPOFOL N/A 11/14/2021   Procedure: ESOPHAGOGASTRODUODENOSCOPY (EGD) WITH PROPOFOL;  Surgeon: Iva Boop, MD;  Location: [facility] ENDOSCOPY;  Service: Gastroenterology;  Laterality: N/A;   IR ANGIO INTRA EXTRACRAN SEL COM CAROTID INNOMINATE BILAT MOD SED  01/17/2021   IR ANGIO VERTEBRAL SEL SUBCLAVIAN INNOMINATE UNI R MOD SED  01/17/2021   IR CT HEAD LTD  06/30/2020   IR INTRA CRAN STENT  06/30/2020   IR PERCUTANEOUS ART THROMBECTOMY/INFUSION INTRACRANIAL INC DIAG ANGIO  06/30/2020   IR RADIOLOGIST EVAL & MGMT  08/26/2020   IR US GUIDE VASC ACCESS RIGHT  01/17/2021   RADIOLOGY WITH ANESTHESIA N/A 06/30/2020   Procedure: RADIOLOGY WITH ANESTHESIA;  Surgeon: Radiologist,  Medication, MD;  Location: [facility] OR;  Service: Radiology;  Laterality: N/A;   Social History   Tobacco Use   Smoking status: Never   Smokeless tobacco: Never  Vaping Use   Vaping status: Never Used  Substance Use Topics   Alcohol use: No   Drug use: No   Family History  Problem Relation Age of Onset   Stroke Maternal Grandfather    Allergies  Allergen Reactions   Homatropine Itching   Iodinated Contrast Media Itching and Other (See Comments)   Doxycycline Nausea And Vomiting and Other (See Comments)   Sulfa Antibiotics Rash and Hives   Codeine Hives and Swelling    Swollen tongue   Hydrocodone Itching   Hydrocodone Bit-Homatrop Mbr Itching   Sulfamethoxazole Hives   Ace Inhibitors Cough, Itching and Rash      ROS Negative unless stated above    Objective:     BP 110/80 (BP Location: Left Arm, Patient Position: Sitting, Cuff Size: Large)   Pulse 78   Temp (!) 97.5 F (36.4 C) (Oral)   Ht 5\' 2"  (1.575 m)   Wt 179 lb 12.8 oz (81.6 kg)   LMP 09/28/2012   SpO2 96%   BMI 32.89 kg/m  BP Readings from  Last 3 Encounters:  08/14/23 110/80  07/12/23 (!) 153/51  07/12/23 104/71   Wt Readings from Last 3 Encounters:  08/14/23 179 lb 12.8 oz (81.6 kg)  07/12/23 183 lb 12.8 oz (83.4 kg)  05/15/23 185 lb 12.8 oz (84.3 kg)    Physical Exam Constitutional:      General: She is not in acute distress.    Appearance: Normal appearance.  HENT:     Head: Normocephalic and atraumatic.     Nose: Nose normal.     Mouth/Throat:     Mouth: Mucous membranes are moist.  Cardiovascular:     Rate and Rhythm: Normal rate and regular rhythm.     Heart sounds: Normal heart sounds. No murmur heard.    No gallop.  Pulmonary:     Effort: Pulmonary effort is normal. No respiratory distress.     Breath sounds: Normal breath sounds. No wheezing, rhonchi or rales.  Skin:    General: Skin is warm and dry.  Neurological:     Mental Status: She is alert and oriented to person, place,  and time.     No results found for any visits on 08/14/23.    Assessment & Plan:  Seizure disorder (HCC) -     Topiramate; Take 1 tablet (100 mg total) by mouth 2 (two) times daily. Take 1 tablet twice daily  Dispense: 180 tablet; Refill: 1  Chronic kidney disease, stage IV (severe) (HCC)  Type 2 diabetes mellitus with other specified complication, without long-term current use of insulin (HCC) -     Microalbumin / creatinine urine ratio  Essential hypertension -     amLODIPine Besylate; Take 1 tablet (10 mg total) by mouth daily.  Dispense: 90 tablet; Refill: 3  Expressive aphasia  Screening for osteoporosis -     DG Bone Density; Future  Hyperlipidemia LDL goal <70 -     Atorvastatin Calcium; Take 1 tablet (80 mg total) by mouth daily.  Dispense: 90 tablet; Refill: 3  Migraine without status migrainosus, not intractable, unspecified migraine type -     Topiramate; Take 1 tablet (100 mg total) by mouth 2 (two) times daily. Take 1 tablet twice daily  Dispense: 180 tablet; Refill: 1  Colon cancer screening -     Ambulatory referral to Gastroenterology  Discussed sz prevention.  No seizure activity noted since taking topiramate 100 mg. F/u with Neurology prn.  CKD stable.  Renally dose meds and avoid nephrotoxic meds.  F/u with Nephro.  Discussed HA prevention.  Consider trial of antihistamine as allergies may be contributing.  Tyelnol prn.  Avoid regular use as may trigger rebound HAs.  Referrals/orders placed for mammogram, colonoscopy, and bone density.  Continue stating for elevated LDL.  Expressive aphasia 2/2 h/o CVA improving slightly.  Pt more intelligible.   BP well controlled.  Continue current meds.   Return in about 6 months (around 02/14/2024). Sooner if needed.  Deeann Saint, MD

## 2023-08-15 ENCOUNTER — Encounter: Payer: Self-pay | Admitting: Family Medicine

## 2023-08-26 ENCOUNTER — Ambulatory Visit: Attending: Nurse Practitioner | Admitting: Nurse Practitioner

## 2023-08-26 ENCOUNTER — Encounter: Payer: Self-pay | Admitting: Nurse Practitioner

## 2023-08-26 DIAGNOSIS — Z01810 Encounter for preprocedural cardiovascular examination: Secondary | ICD-10-CM

## 2023-08-26 NOTE — Progress Notes (Signed)
 Virtual Visit via Telephone Note   Because of Courtney Burke co-morbid illnesses, she is at least at moderate risk for complications without adequate follow up.  This format is felt to be most appropriate for this patient at this time.  Due to technical limitations with video connection (technology), today's appointment will be conducted as an audio only telehealth visit, and Torria Fromer verbally agreed to proceed in this manner.   All issues noted in this document were discussed and addressed.  No physical exam could be performed with this format.  Evaluation Performed:  Preoperative cardiovascular risk assessment _____________   Date:  08/26/2023   Patient ID:  Courtney Burke, DOB April 24, 1956, MRN [id number] Patient Location:  Home Provider location:   Office  Primary Care Provider:  Deeann Saint, MD Primary Cardiologist:  None  Chief Complaint / Patient Profile   68 y.o. y/o female with a h/o type 2 diabetes, hypertension, CKD, obesity s/p gastric bypass 2017, hyperlipidemia, stroke s/p MCA stenting, atrial fibrillation on chronic anticoagulation who is pending right long and ring trigger release on 09/09/23 with Dr. Merlyn Lot and presents today for telephonic preoperative cardiovascular risk assessment.  History of Present Illness    Courtney Burke is a 68 y.o. female who presents via audio/video conferencing for a telehealth visit today.  Pt was last seen in cardiology clinic on 04/09/23 by Francis Dowse, PA.  At that time Hannelore Bova was doing well.  The patient is now pending procedure as outlined above. Since her last visit, she  denies chest pain, shortness of breath, lower extremity edema, fatigue, palpitations, melena, hematuria, hemoptysis, diaphoresis, weakness, presyncope, syncope, orthopnea, and PND. Spoke with patient and her daughter Alvy Beal. Patient remains active with house work and yard work and walking her dog. She is able to achieve > 4  METS activity without concerning cardiac symptoms.  Past Medical History    Past Medical History:  Diagnosis Date   Acute ischemic left MCA stroke (HCC) 06/30/2020   Acute ischemic stroke (HCC)    Acute stroke due to occlusion of left middle cerebral artery (HCC) 06/30/2020   Age-related nuclear cataract of both eyes 09/28/2015   Arthralgia of multiple joints 09/28/2015   Asthma    Borderline diabetes    Cerebrovascular accident (CVA) (HCC)    Chronic bilateral thoracic back pain 06/05/2018   Chronic renal insufficiency, stage 1 08/11/2014   Diabetes mellitus type 2 in obese    Dyslipidemia    Dysphagia, post-stroke    Erosive gastritis 07/20/2019   Formatting of this note might be different from the original. On EGD 07/2019   Esophagitis 07/20/2019   Formatting of this note might be different from the original. Added automatically from request for surgery [id number]  Formatting of this note might be different from the original. On EGD 07/2019   Essential hypertension 09/28/2015   Essential thrombocytosis (HCC) 09/28/2015   Gallstones 08/18/2019   Formatting of this note might be different from the original. Added automatically from request for surgery [id number]   Gastroesophageal reflux disease without esophagitis 09/28/2015   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   Hyperlipidemia, unspecified 03/16/2013   Hypertension    Keratoconjunctivitis sicca of both eyes not specified as Sjogren's 09/28/2015   Left middle cerebral artery stroke (HCC) 07/09/2020   Morbid obesity (HCC) 05/21/2013   Seizures (HCC)    Stage 3b chronic kidney disease (HCC)    Status post gastric bypass for obesity 09/28/2015   Past  Surgical History:  Procedure Laterality Date   ANKLE SURGERY     CARPAL TUNNEL RELEASE     carpel tunnel     ESOPHAGOGASTRODUODENOSCOPY (EGD) WITH PROPOFOL N/A 11/14/2021   Procedure: ESOPHAGOGASTRODUODENOSCOPY (EGD) WITH PROPOFOL;  Surgeon: Iva Boop, MD;  Location: [facility]  ENDOSCOPY;  Service: Gastroenterology;  Laterality: N/A;   IR ANGIO INTRA EXTRACRAN SEL COM CAROTID INNOMINATE BILAT MOD SED  01/17/2021   IR ANGIO VERTEBRAL SEL SUBCLAVIAN INNOMINATE UNI R MOD SED  01/17/2021   IR CT HEAD LTD  06/30/2020   IR INTRA CRAN STENT  06/30/2020   IR PERCUTANEOUS ART THROMBECTOMY/INFUSION INTRACRANIAL INC DIAG ANGIO  06/30/2020   IR RADIOLOGIST EVAL & MGMT  08/26/2020   IR US GUIDE VASC ACCESS RIGHT  01/17/2021   RADIOLOGY WITH ANESTHESIA N/A 06/30/2020   Procedure: RADIOLOGY WITH ANESTHESIA;  Surgeon: Radiologist, Medication, MD;  Location: [facility] OR;  Service: Radiology;  Laterality: N/A;    Allergies  Allergies  Allergen Reactions   Homatropine Itching   Iodinated Contrast Media Itching and Other (See Comments)   Doxycycline Nausea And Vomiting and Other (See Comments)   Sulfa Antibiotics Rash and Hives   Codeine Hives and Swelling    Swollen tongue   Hydrocodone Itching   Hydrocodone Bit-Homatrop Mbr Itching   Sulfamethoxazole Hives   Ace Inhibitors Cough, Itching and Rash    Home Medications    Prior to Admission medications   Medication Sig Start Date End Date Taking? Authorizing Provider  acetaminophen (TYLENOL) 500 MG tablet Take 1,000 mg by mouth every 6 (six) hours as needed for moderate pain or headache.    [provider]  amitriptyline (ELAVIL) 50 MG tablet Take 1 tablet (50 mg total) by mouth at bedtime. 03/06/23   Ihor Austin, NP  amLODipine (NORVASC) 10 MG tablet Take 1 tablet (10 mg total) by mouth daily. 08/14/23   Deeann Saint, MD  apixaban (ELIQUIS) 5 MG TABS tablet TAKE 1 TABLET BY MOUTH TWICE A DAY 06/03/23   Camnitz, Andree Coss, MD  aspirin EC 81 MG tablet Take 1 tablet (81 mg total) by mouth daily. Swallow whole. 06/29/21   Ihor Austin, NP  atorvastatin (LIPITOR) 80 MG tablet Take 1 tablet (80 mg total) by mouth daily. 08/14/23   Deeann Saint, MD  cholecalciferol (VITAMIN D3) 25 MCG (1000 UNIT) tablet Take by mouth.  09/30/20   [provider]  KLOR-CON M20 20 MEQ tablet Take 40 mEq by mouth 2 (two) times daily. 05/16/21   [provider]  levETIRAcetam (KEPPRA) 750 MG tablet Take 1 tablet (750 mg total) by mouth 2 (two) times daily. 03/06/23   Ihor Austin, NP  Multiple Vitamin (MULTIVITAMIN WITH MINERALS) TABS tablet Take 1 tablet by mouth daily.    [provider]  omeprazole (PRILOSEC) 40 MG capsule Take 1 tablet by mouth daily. 07/28/20   [provider]  pantoprazole (PROTONIX) 40 MG tablet Take 40 mg by mouth daily.    [provider]  Rimegepant Sulfate (NURTEC) 75 MG TBDP Take 1 tablet (75 mg total) by mouth as needed. 08/23/22   Ihor Austin, NP  sertraline (ZOLOFT) 25 MG tablet Take 1 tablet (25 mg total) by mouth daily. 02/13/23   Deeann Saint, MD  topiramate (TOPAMAX) 100 MG tablet Take 1 tablet (100 mg total) by mouth 2 (two) times daily. Take 1 tablet twice daily 08/14/23   Deeann Saint, MD  ursodiol (ACTIGALL) 250 MG tablet Take 250 mg  by mouth 2 (two) times daily. 07/28/20   [provider]  Vitamin D, Ergocalciferol, (DRISDOL) 1.25 MG (50000 UNIT) CAPS capsule Take 1 capsule (50,000 Units total) by mouth every 7 (seven) days. 03/28/22   Deeann Saint, MD    Physical Exam    Vital Signs:  Bridgitte Felicetti does not have vital signs available for review today.  Given telephonic nature of communication, physical exam is limited. AAOx3. NAD. Normal affect.  Speech and respirations are unlabored.  Accessory Clinical Findings    None  Assessment & Plan    1.  Preoperative Cardiovascular Risk Assessment: According to the Revised Cardiac Risk Index (RCRI), her Perioperative Risk of Major Cardiac Event is (%): 6.6. Her Functional Capacity in METs is: 6.61 according to the Duke Activity Status Index (DASI). The patient is doing well from a cardiac perspective. Therefore, based on ACC/AHA guidelines, the patient would be at  acceptable risk for the planned procedure without further cardiovascular testing.   The patient was advised that if she develops new symptoms prior to surgery to contact our office to arrange for a follow-up visit, and she verbalized understanding.  Per office protocol, patient can hold Eliquis for 2 days prior to procedure.   A copy of this note will be routed to requesting surgeon.  Time:   Today, I have spent 10 minutes with the patient with telehealth technology discussing medical history, symptoms, and management plan.    Levi Aland, NP-C  08/26/2023, 2:38 PM [address], [address] Office [phone] Fax [phone]

## 2023-09-13 NOTE — Progress Notes (Signed)
 [facility] Summary Report / Loop Recorder

## 2023-09-15 ENCOUNTER — Other Ambulatory Visit: Payer: Self-pay | Admitting: Adult Health

## 2023-09-15 ENCOUNTER — Other Ambulatory Visit: Payer: Self-pay | Admitting: Family Medicine

## 2023-09-15 DIAGNOSIS — Z8673 Personal history of transient ischemic attack (TIA), and cerebral infarction without residual deficits: Secondary | ICD-10-CM

## 2023-09-15 DIAGNOSIS — R4586 Emotional lability: Secondary | ICD-10-CM

## 2023-09-16 ENCOUNTER — Ambulatory Visit (INDEPENDENT_AMBULATORY_CARE_PROVIDER_SITE_OTHER): Payer: Medicare Other

## 2023-09-16 DIAGNOSIS — I639 Cerebral infarction, unspecified: Secondary | ICD-10-CM

## 2023-09-16 LAB — CUP PACEART REMOTE DEVICE CHECK
Date Time Interrogation Session: 20250413230639
Implantable Pulse Generator Implant Date: 20220407

## 2023-09-23 ENCOUNTER — Ambulatory Visit: Payer: Medicare Other

## 2023-09-26 ENCOUNTER — Encounter: Payer: Self-pay | Admitting: Adult Health

## 2023-09-26 ENCOUNTER — Ambulatory Visit (INDEPENDENT_AMBULATORY_CARE_PROVIDER_SITE_OTHER): Payer: Medicare Other | Admitting: Adult Health

## 2023-09-26 VITALS — BP 132/81 | HR 76 | Ht 62.0 in | Wt 180.0 lb

## 2023-09-26 DIAGNOSIS — I69398 Other sequelae of cerebral infarction: Secondary | ICD-10-CM

## 2023-09-26 DIAGNOSIS — I69320 Aphasia following cerebral infarction: Secondary | ICD-10-CM | POA: Diagnosis not present

## 2023-09-26 DIAGNOSIS — I63411 Cerebral infarction due to embolism of right middle cerebral artery: Secondary | ICD-10-CM

## 2023-09-26 DIAGNOSIS — I69319 Unspecified symptoms and signs involving cognitive functions following cerebral infarction: Secondary | ICD-10-CM | POA: Diagnosis not present

## 2023-09-26 DIAGNOSIS — G441 Vascular headache, not elsewhere classified: Secondary | ICD-10-CM

## 2023-09-26 DIAGNOSIS — R269 Unspecified abnormalities of gait and mobility: Secondary | ICD-10-CM

## 2023-09-26 MED ORDER — DULOXETINE HCL 30 MG PO CPEP
30.0000 mg | ORAL_CAPSULE | Freq: Every day | ORAL | 11 refills | Status: DC
Start: 2023-09-26 — End: 2023-10-22

## 2023-09-26 NOTE — Patient Instructions (Addendum)
 Your Plan:  Referral placed for home health physical therapy to help with exercise and generalized strengthening   Continue Keppra  750 mg twice daily and Topamax  100 mg twice daily for seizure prevention  Please call with any additional seizure activity  Start Cymbalta  30 mg daily for further help with headaches - this will also help with anxiety  Continue amitriptyline  for headache prevention and Nurtec as needed for rescue Please ensure limiting use of Tylenol  to no more than 2-3 times per week or 8 times per month to avoid rebound headache  Continue current stroke prevention medications and ensure ongoing follow-up with PCP and cardiology for stroke risk factor management     Follow-up in 6 months or call earlier if needed     Thank you for coming to see us  at [facility]. I hope we have been able to provide you high quality care today.  You may receive a patient satisfaction survey over the next few weeks. We would appreciate your feedback and comments so that we may continue to improve ourselves and the health of our patients.     Analgesic Rebound Headache An analgesic rebound headache is a secondary disorder that is caused by the overuse of pain medicine (analgesic) to treat the original (primary) headache. It is sometimes called a medication overuse headache or a drug-induced headache. Any type of primary headache can return as a rebound headache if a person regularly takes pain medicine. The types of primary headaches that are commonly related to rebound headaches include: Migraines. Tension headaches. These are caused by tense muscles in the head and neck area. Cluster headaches. These happen on one side of the head and around the eye. If rebound headaches continue, they can become long-term, daily headaches. What are the causes? Rebound headaches may be caused by frequent use of: Over-the-counter medicines, such as aspirin , ibuprofen, and  acetaminophen . Sinus-relief medicines. Medicines that contain caffeine. Narcotic pain medicines, such as codeine and oxycodone. Some prescription migraine medicines. What are the signs or symptoms? The symptoms of a rebound headache are the same as the symptoms of the primary headache. Symptoms of specific types of headaches include: Migraine headache Pulsing or throbbing pain on one or both sides of the head. Severe pain that makes it hard to do daily activities. Pain that gets worse with physical activity. Nausea, vomiting, or both. Pain that may get worse around bright lights, loud noises, or smells. Vision changes. Numbness in one or both arms. Tension headache Pressure around the head. Dull, aching head pain. Pain felt over the front and sides of the head. Tenderness in the muscles of the head, neck, and shoulders. Cluster headache Severe pain that begins in or around one eye or temple. Droopy or swollen eyelid, or redness and tearing in the eye on the same side as the pain. One-sided head pain. Nausea. Runny nose. Sweaty, pale facial skin. Restlessness. How is this diagnosed? Rebound headaches are diagnosed by reviewing: Your medical history, including a description of your primary headaches. Your pain medicines for your primary headaches and how often you take them. How is this treated? Rebound headaches may be treated or managed by: Stopping frequent use of pain medicine. This may make your headaches worse at first, but the pain should then become less manageable and less frequent and severe. Seeing a headache specialist. They may be able to help you manage your headaches and help make sure there is not another cause of the headaches. Using methods of stress  relief, such as acupuncture, counseling, biofeedback, and massage. Follow these instructions at home: Medicines  Take over-the-counter and prescription medicines only as told by your health care provider. Stop the  repeated use of pain medicine as told by your provider. Stopping can be hard. Carefully follow instructions from your provider. Lifestyle Do not drink alcohol. Do not use any products that contain nicotine or tobacco. These products include cigarettes, chewing tobacco, and vaping devices, such as e-cigarettes. If you need help quitting, ask your provider. Get 7-9 hours of sleep each night, or the amount recommended by your provider. Find ways to manage stress, such as acupuncture, counseling, biofeedback, and massage. Exercise regularly. Exercise for at least 30 minutes, 5 times each week. General instructions Avoid things that may bring on (trigger) your primary headaches, such as certain foods. Contact a health care provider if: Medicine does not help your migraine. Your pain keeps coming back even with medicine. Get help right away if: You have new headache pain. You have headache pain that is different than what you have felt in the past. You have numbness or tingling in your arms or legs. You have changes in your speech or vision. This information is not intended to replace advice given to you by your health care provider. Make sure you discuss any questions you have with your health care provider. Document Revised: 01/15/2022 Document Reviewed: 01/15/2022 Elsevier Patient Education  2024 [employer]     Duloxetine  Delayed-Release Capsules What is this medication? DULOXETINE  (doo LOX e teen) treats depression, anxiety, fibromyalgia, and certain types of chronic pain such as nerve, bone, or joint pain. It increases the amount of serotonin and norepinephrine in the brain, hormones that help regulate mood and pain. It belongs to a group of medications called SNRIs. This medicine may be used for other purposes; ask your health care provider or pharmacist if you have questions. COMMON BRAND NAME(S): Cymbalta , Drizalma, Irenka What should I tell my care team before I take this  medication? They need to know if you have any of these conditions: Bipolar disorder Glaucoma High blood pressure Kidney disease Liver disease Seizures Suicidal thoughts, plans, or attempt by you or a family member Take medications that treat or prevent blood clots Taken an MAOI, such as Carbex, Eldepryl, Marplan, Nardil, or Parnate in the last 14 days Trouble passing urine An unusual reaction to duloxetine , other medications, foods, dyes, or preservatives Pregnant or trying to get pregnant Breastfeeding How should I use this medication? Take this medication by mouth with water . Take it as directed on the prescription label at the same time every day. Do not cut, crush, or chew this medication. Swallow the capsules whole. Some capsules may be opened and sprinkled on applesauce. Check with your care team or pharmacist if you are not sure. You can take this medication with or without food. Do not take your medication more often than directed. Do not stop taking this medication suddenly except upon the advice of your care team. Stopping this medication too quickly may cause serious side effects or your condition may worsen. A special MedGuide will be given to you by the pharmacist with each prescription and refill. Be sure to read this information carefully each time. Talk to your care team about the use of this medication in children. While it may be prescribed for children as young as 7 years for selected conditions, precautions do apply. Overdosage: If you think you have taken too much of this medicine contact a poison  control center or emergency room at once. NOTE: This medicine is only for you. Do not share this medicine with others. What if I miss a dose? If you miss a dose, take it as soon as you can. If it is almost time for your next dose, take only that dose. Do not take double or extra doses. What may interact with this medication? Do not take this medication with any of the  following: Desvenlafaxine Levomilnacipran Linezolid MAOIs, such as Carbex, Eldepryl, Emsam, Marplan, Nardil, and Parnate Methylene blue (injected into a vein) Milnacipran Safinamide Thioridazine Venlafaxine Viloxazine This medication may also interact with the following: Alcohol Amphetamines Aspirin  and aspirin -like medications Certain antibiotics, such as ciprofloxacin and enoxacin Certain medications for blood pressure, heart disease, irregular heart beat Certain medications for mental health conditions Certain medications for migraine headache, such as almotriptan, eletriptan, frovatriptan, naratriptan, rizatriptan, sumatriptan, zolmitriptan Certain medications that treat or prevent blood clots, such as warfarin, enoxaparin , and dalteparin Cimetidine Fentanyl  Lithium NSAIDS, medications for pain and inflammation, such as ibuprofen or naproxen Phentermine Procarbazine Rasagiline Sibutramine St. John's Wort Theophylline Tramadol Tryptophan This list may not describe all possible interactions. Give your health care provider a list of all the medicines, herbs, non-prescription drugs, or dietary supplements you use. Also tell them if you smoke, drink alcohol, or use illegal drugs. Some items may interact with your medicine. What should I watch for while using this medication? Tell your care team if your symptoms do not get better or if they get worse. Visit your care team for regular checks on your progress. Because it may take several weeks to see the full effects of this medication, it is important to continue your treatment as prescribed by your care team. This medication may cause serious skin reactions. They can happen weeks to months after starting the medication. Contact your care team right away if you notice fevers or flu-like symptoms with a rash. The rash may be red or purple and then turn into blisters or peeling of the skin. You may also notice a red rash with swelling  of the face, lips, or lymph nodes in your neck or under your arms. Watch for new or worsening thoughts of suicide or depression. This includes sudden changes in mood, behaviors, or thoughts. These changes can happen at any time but are more common in the beginning of treatment or after a change in dose. Call your care team right away if you experience these thoughts or worsening depression. This medication may cause mood and behavior changes, such as anxiety, nervousness, irritability, hostility, restlessness, excitability, hyperactivity, or trouble sleeping. These changes can happen at any time but are more common in the beginning of treatment or after a change in dose. Call your care team right away if you notice any of these symptoms. This medication may affect your coordination, reaction time, or judgment. Do not drive or operate machinery until you know how this medication affects you. Sit up or stand slowly to reduce the risk of dizzy or fainting spells. Drinking alcohol with this medication can increase the risk of these side effects. This medication may increase blood sugar. The risk may be higher in patients who already have diabetes. Ask your care team what you can do to lower your risk of diabetes while taking this medication. This medication can cause an increase in blood pressure. This medication can also cause a sudden drop in your blood pressure, which may make you feel faint and increase the chance of a  fall. These effects are most common when you first start the medication or when the dose is increased, or during use of other medications that can cause a sudden drop in blood pressure. Check with your care team for instructions on monitoring your blood pressure while taking this medication. Your mouth may get dry. Chewing sugarless gum or sucking hard candy and drinking plenty of water  may help. Contact your care team if the problem does not go away or is severe. What side effects may I notice  from receiving this medication? Side effects that you should report to your care team as soon as possible: Allergic reactions--skin rash, itching, hives, swelling of the face, lips, tongue, or throat Bleeding--bloody or black, tar-like stools, red or dark brown urine, vomiting blood or brown material that looks like coffee grounds, small, red or purple spots on skin, unusual bleeding or bruising Increase in blood pressure Liver injury--right upper belly pain, loss of appetite, nausea, light-colored stool, dark yellow or brown urine, yellowing skin or eyes, unusual weakness or fatigue Low sodium level--muscle weakness, fatigue, dizziness, headache, confusion Redness, blistering, peeling, or loosening of the skin, including inside the mouth Serotonin syndrome--irritability, confusion, fast or irregular heartbeat, muscle stiffness, twitching muscles, sweating, high fever, seizures, chills, vomiting, diarrhea Sudden eye pain or change in vision such as blurry vision, seeing halos around lights, vision loss Thoughts of suicide or self-harm, worsening mood, feelings of depression Trouble passing urine Side effects that usually do not require medical attention (report to your care team if they continue or are bothersome): Change in sex drive or performance Constipation Diarrhea Dizziness Dry mouth Excessive sweating Loss of appetite Nausea Vomiting This list may not describe all possible side effects. Call your doctor for medical advice about side effects. You may report side effects to FDA at 1-800-FDA-1088. Where should I keep my medication? Keep out of the reach of children and pets. Store at room temperature between 15 and 30 degrees C (59 to 86 degrees F). Get rid of any unused medication after the expiration date. To get rid of medications that are no longer needed or have expired: Take the medication to a medication take-back program. Check with your pharmacy or law enforcement to find a  location. If you cannot return the medication, check the label or package insert to see if the medication should be thrown out in the garbage or flushed down the toilet. If you are not sure, ask your care team. If it is safe to put it in the trash, take the medication out of the container. Mix the medication with cat litter, dirt, coffee grounds, or other unwanted substance. Seal the mixture in a bag or container. Put it in the trash. NOTE: This sheet is a summary. It may not cover all possible information. If you have questions about this medicine, talk to your doctor, pharmacist, or health care provider.  2024 Elsevier/Gold Standard (2022-02-22 00:00:00)

## 2023-09-26 NOTE — Progress Notes (Signed)
 [facility] [address] [place]. [place] [address] [phone]       OFFICE FOLLOW-UP NOTE  Ms. Abagail Aase Date of Birth:  January 28, 1956 Medical Record Number:  [id number]    Reason for visit: Stroke, seizure, headaches Primary neurologist: Dr. Janett Medin  Chief Complaint  Patient presents with   Seizures    Rm 8 with brother Dee Farber Pt is well and stable. Reports No new stroke/seizure concerns.      HPI:   Update 09/26/2023 JM: Patient returns for scheduled follow-up visit accompanied by her brother.   She was seen in the ED back in February with 2-minute seizure-like activity with staring off.  Noted taking an adequate dose of topiramate , only taking 25 mg twice daily instead of prescribed 100 mg twice daily, reported compliance with Keppra .   No additional seizure activity since taking correct dose of topiramate  100 mg twice daily in addition to Keppra  750mg  BID (lowered after prior visit d/t kidney impairment).  Tolerating without side effects.  She reports continued headaches that can be associated with dizziness. Occurs daily. She feels this limits her activity and functioning.  Continues on amitriptyline .  She reports taking Tylenol  twice daily but did have a short conversation with her daughter prior to end of visit and noted only taking 1 Tylenol  a day more recently after finger trigger release surgery.  Persistent aphasia, family does feel it has been gradually improving.  No recent confusional episodes.  Gait impaired, daughter reports difficulty getting her up and moving some days, frequently refuses exercise.  No new stroke/[name] symptoms. Reports compliance on Eliquis , aspirin  and atorvastatin  without side effects.  Routinely follows with PCP and cardiology.      History provided for reference purposes only Update 03/06/2023 JM: Patient returns for follow-up visit accompanied by her daughter. Daughter reports a fall after tripping on a stick over  the weekend while she was out with her friends and has been more fatigued since, she has been staying in her bed the past several days despite family trying to discourage this. Per daughter, patient has not complained of any worsening headaches (denies hitting head with fall), no new weakness or change in speech. Upon arrival to today's visit with CMA, patient had difficulty stating her birthday (which she is normally able to do), she continued to focus on her weight after she was weighed initially. Later during visit, she was asked her name which she was able to state and then when asked her birthday, she kept repeatedly trying to say her name, she was later able to state her birthday. Per daughter, she appears more confused since arriving to visit. At one point, she started to become very emotional for unclear reason but daughter able to calm her down. After further discussion, patient restarted sertraline  last week for more recent changes in mood. Has been more angry and agitated over the past several weeks.  Previously on sertraline  by psychiatry but apparently they would no longer prescribe and was abruptly discontinued.  No other changes to medications, no recent illness. Reports sleeps well and good appetite although admits to no water  intake, reports to drink tea during the day.   Headaches overall well controlled, occasional headaches, will resolve with Nurtec. Remains on amitriptyline  50 mg daily. No witnessed seizure activity, continues on Keppra  1500 mg twice daily and topiramate  100 mg twice daily.  Compliant on stroke prevention medications.  Routinely follows with PCP.  Update 08/23/2022 JM: Patient returns for routine follow-up accompanied  by her daughter.  Seizure: At prior visit, reported seizure event likely unprovoked.  Reported compliance on Keppra  1500 mg twice daily.  She was started on topiramate  with gradual titration to 50 mg twice daily (which can also help with reported continued  headaches).  She has not had any additional seizure activity and tolerated medications well.   Headache: still having headaches but improved since starting topamax , currently having about 2-3 per week, will take extra dose of aspirin  as needed for headache (in addition to daily 81mg  dosage).    Stroke: daughter reports episode last week where she was slurring her words while laying down in bed, lasted about 30 minutes. Was fatigued at that time. Was able to follow commands and answer questions but daughter believes she had slight confusion. Unsure if headache present.  No evidence of weakness or any other neurological deficits at that time. Daughter dose endorse increased stressors and unsure if this was related.  No other new stroke/[name] symptoms since that time. Continued aphasia stable.  Compliant on Eliquis , aspirin  and atorvastatin .  Blood pressure well-controlled.  Routinely follows with PCP and cardiology.    Update 04/05/2022 JM: Patient returns for 69-month follow-up accompanied by her daughter  Seizure: Currently on Keppra  1500 mg twice daily Daughter reports seizure last week, was sitting on the side of her bed folding laundry talking with her daughter and then suddenly stopped talking and leaned back into bed frame, eyes open, was not responding to daughter, after a couple of minutes daughter splashed water  on her face and patient came to.  She was drowsy for short while after.  No additional events since that time.  Denies any missed dosages of Keppra , recent illness or infection or other specific provoking factors.   Headaches: Currently on amitriptyline  50 mg nightly Reports continued headaches, some improvement initially after increasing amitriptyline  dosage at last visit but more lately has been having more headaches.  Reports these occur almost daily. Will use Tylenol  couple times weekly for more severe headaches. Daughter mentions she has not been sleeping well recently. Currently  working with PCP regarding this.  She also complains of daytime fatigue, recently started on vitamin D  supplement for vitamin D  deficiency.    Stroke: Stable. No new stroke/[name] symptoms. Remains on Eliquis , aspirin  and atorvastatin  Blood pressure well controlled   Update 11/30/2021 JM: Patient returns for follow-up visit accompanied by her daughter.  Previously seen 3 months ago with complaints of worsening headache as well as breakthrough seizure.   Keppra  dosage increased to 1500 mg twice daily, tolerating without side effects, denies any recurrent seizure activity.    Reports continued daily frontal headaches 2-3x per day, usually short lasting, takes tylenol  1000mg  of tyneol 2-3x daily which resolves headaches.  Remains on amitriptyline  25 mg nightly, denies side effects.  Stable from stroke standpoint without new stroke/[name] symptoms.  Residual speech deficit stable.  Compliant on Eliquis , and aspirin , denies side effects. Has had difficulty obtaining amlodipine  and atorvastatin  refill, has been out over the past 3 months, reports PCP will not refill as she did not start medications.  blood pressure today 123/77. Monitors at home and has been stable.   Was hospitalized 6/7 through 6/14 for 1 week nausea/vomiting and intermittent abdominal pain.  No clear source identified.  Did note dehydration with elevated creatinine and anemia, hypokalemia, hypomagnesemia and hyponatremia. She has not had any additional n/v or abdominal pains.  No further concerns at this time   Update 08/28/2021 JM: Returns for  acute visit due to complaints of worsening headaches.  At prior visit 2 months ago, she was started on amitriptyline  for headache complaints since her stroke in 05/2021. Has been taking amitriptyline  25mg  nightly - per daughter, thought they were improving as no mention of having headaches since starting amitriptyline  but at cardiology visit last week, was complaining of headache and not feeling  right upon arriving to appt. She was able to ambulate back but walking slow, sat down and then stopped talking. Mouth was twisted towards the side, teeth clenched, and lost consciousness for 2-3 minutes, she did have postictal confusion but improved after a few minutes. She was not sent to ED as she recovered back to baseline within reasonable time. No additional seizures since that time. She has been complaining of a headache since her seizure but no additional headaches over the past 2 days. Denies any new/worsening speech/language difficulties, weakness, vision changes or mental status changes. Daughter does report starting on sertraline  50mg  daily by psychiatry 2 weeks ago for depression, no other medication changes. No recent illness, trauma, falls, or other potential provoking factors. She does not drink alcohol or use recreational drugs.  Reports compliance with all medications.  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 fib after presenting with headache, worsening speech and confusion. MRA right ICA 50% stenosis and severe proximal right M2 stenosis, L M1 stent patent, no LVO.  On Eliquis  twice daily and Brilinta  PTA recommended continuation as well as adding aspirin  81 mg daily.  LDL 21 on atorvastatin .  A1c 5.5.  EEG generalized slowing but no seizures - remained on keppra . Discharged 12/7. Returned on 12/12 after episode of vomiting and syncopal episode with MRI showing extension of recent R MCA stroke likely in setting of hypotension/syncope in setting of right ICA and M2 stenosis. Continued Eliquis  and asa -Brilinta  discontinued.  Concern of possible seizure on 12/14 with staring off and not responsive, confusion for a few minutes after therefore increased Keppra  to 1g BID. LDL 21 - decreased atorvastatin  to 40 mg daily. BP stabilized on IVF, orthostatics unremarkable.  Residual  expressive aphasia with word salad. Therapy evals recommended OP therapies.    Today she is accompanied by her daughter, Nettie Barb. Having headaches since recent stroke. Will take tylenol  with some benefit but will return. Continues to work with SLP noting improvement, family working with her on reading out loud and conversations. Per daughter, patient will push herself too much at times and try to do too much at once. Family tries to ensure she takes breaks. Denies weakness, some imbalance but overall stable. Does c/o fear of additional strokes and difficulty sleeping at night due to mind wandering. No prior issues with depression/anxiety.   Reports compliance on Eliquis  5 mg twice daily and atorvastatin  40 mg daily without side effects. Has not been taking aspirin .  Blood pressure today 151/78 and on recheck, 132/82.  Compliant on Keppra  but over past week has been taking 500mg  BID due to difficulty refilling new dose (had 500mg  tabs left over from prior prescription). Was doing well on 1000mg  BID.   Of note, diagnosed with COVID-19 12/29 after presenting to ED with lightheadedness, dizziness, nausea and vomiting and possible loss of consciousness or near syncope.  Extensive work-up largely unrevealing except COVID-positive which was felt to be contributing to her symptoms. MR brain and MRA head/neck no acute findings or changes from  prior recent imaging.   No further concerns at this time     PERTINENT IMAGING  Per hospitalization 05/15/2021 CT head - Slightly increased hypodensity in the right posterior temporal lobe and lateral occipital lobe, consistent with evolution of previously noted acute infarct. MRI  Extension of the acute infarction in the right MCA territory at the right temporoparietal junction, particularly along the inferior margin of the infarction and with a few tiny punctate satellite foci additionally present. MRA 12/7 - Approximate 50% stenosis at the supraclinoid right ICA,  with probable additional short-segment severe proximal right M2 stenosis.  EEG cortical dysfunction arising from left and right temporal region, no seizure   Per hospitalization 05/10/2021 CT head Old left MCA territory strokes. Newly seen loss of gray-white differentiation at the right temporoparietal junction suggesting acute infarction in that region today. No hemorrhage or mass effect MRI  1. Acute ischemic nonhemorrhagic infarct involving the right temporoccipital junction, posterior right MCA distribution.2. Chronic left MCA territory infarcts, stable from prior.3. Additional small remote right cerebellar infarct.4. Underlying age-related cerebral atrophy with mild chronic small vessel ischemic disease. MRA  head1. Negative intracranial MRA for large vessel occlusion.2. Approximate 50% stenosis at the supraclinoid right ICA, with probable additional short-segment severe proximal right M2 stenosis.3. Vascular stent in place within the left M1 segment. Flow through the stent itself not evaluated by MRA, although patent flow seen distal to the stent.4. Short-segment severe left M2 stenosis, similar to previous.5. Wide patency of the posterior circulation MRA Neck Wide patency of both carotid artery systems and vertebral arteries within the neck. No hemodynamically significant stenosis or other acute vascular abnormality. 2D Echo EF 55 to 60% EEG generalized slowing.  No seizures identified      Update 02/13/2021 JM: Ms. Blandino returns for acute visit in setting of recent seizure activity.  She is accompanied by her brother.  Seizure type event 02/07/2021 evaluated in ED with symptoms of headache and then tonic-clonic jerking lasting 5 to 6 minutes with residual confusion and bowel incontinence.  Initiated Keppra  500 mg twice daily. CT head unremarkable.  No additional events since discharge.  Remains on Keppra  500 mg twice daily tolerating without side effects.  Reports slight worsening of speech  since seizure event.  Was working with SLP and PT prior to seizure but has not yet restarted.  Continues to use a cane and denies any recent falls.  Denies new stroke/[name] symptoms.  Remains on Eliquis  and atorvastatin .  Also compliant on Brilinta  per IR recommendations.  No further concerns at this time.  Update 01/04/2021 JM: Ms. Novoa returns for 93-month stroke follow-up accompanied by her brother, Dee Farber.  Working with SLP for residual aphasia but notes ongoing improvement. Gait has also been improving working with PT - uses cane - did have a fall last month but thankfully without injury.  Denies new stroke/[name] symptoms. ILR showed evidence of atrial fibrillation on 10/05/2020 -Eliquis  initiated in addition to Brilinta . ASA d/c'd.  She does have mild bruising but otherwise tolerating.  Has not yet scheduled diagnostic angiogram with Dr. Alvira Josephs.  Compliant on atorvastatin  without associated side effects.  Blood pressure 136/82. Monitors at home and typically stable 120s/70s.  No further concerns at this time.  Initial visit 10/05/2020 Dr. Janett Medin: Ms. Minch is a 68 year old African-American lady seen today for initial office follow-up visit following hospital admission for stroke in January 2022.  She is accompanied by her daughter.  History is obtained from them and review of hospital electronic medical  records and I personally reviewed imaging films in PACS.  She is a 68 year old pleasant African-American lady with past medical history of diabetes, hypertension, chronic kidney disease stage IIIa, dysphagia, obesity, s/p gastric sleeve surgery in December 2021.  She was brought in as a code stroke by EMS after she developed altered mental status.  She had a witnessed episodes in which her eyes rolled back in her head twice with brief loss of consciousness.  She is subsequently found to have right-sided weakness.  She was seen back the code stroke team upon arrival and NIH stroke scale was found to be 21 and emergent  CT scan of the head was obtained which was unremarkable but CT angiogram showed an acute left middle cerebral artery occlusion in the M1 segment with CT perfusion scan showing a favorable penumbra.  Aspect score was 6 on admission.  Infarct core was 11 to 20 cc only.  After discussion of risk benefits with the patient's family patient was taken for emergent mechanical thrombectomy which was performed by Dr. Alvira Josephs but there was underlying MCA stenosis requiring rescue MCA stenting with resultant TICI 2C revascularization.  She was admitted admitted to the intensive care unit blood pressure tightly controlled.  She was subsequently extubated and found to have global aphasia and right hemiparesis.  MRI scan showed acute infarct involving left MCA territory of basal ganglia as well as left patchy temporal frontal and parietal lobes.  There is small area of hemorrhage noted in the left posterior temporal lobe.  2D echo showed ejection fraction of 6065%.  Lower extremity venous Dopplers negative for DVT.  LDL cholesterol 68 mg percent.  Hemoglobin A1c was 5.4.  Patient was started on aspirin  and Brilinta  for MCA stent.  History of dysphagia and had a feeding tube inserted.  She was transferred to inpatient rehab where she stayed for few weeks.  She also started on Keppra  for seizure prophylaxis due to her episodes of seizure-like presentation.  EEG she had shown intermittent rhythmic delta slowing in the left frontal region.  Patient has been at home since rehab.  She is to get getting physical occupational and speech therapy which is now down to 1 day a week.  She is able to speak better but still has to speak slowly and occasionally gets stuck and then some word finding difficulties.  She has noticed improvement in right-sided strength and now she can walk independently but needs a walker.  She still has some dragging of the right foot.  Her balance is poor.  However she has had no falls or injuries.  She is able to  manage most activities of daily living but needs a little supervision.  She is tolerating aspirin  and Brilinta  well with only minor bruising and no bleeding.  Blood pressures well controlled today it is elevated in the office at 153/75.  She remains on Lipitor  which is tolerating well without muscle aches and pains.    ROS:   14 system review of systems is positive for those listed in HPI and all other systems negative  PMH:  Past Medical History:  Diagnosis Date   Acute ischemic left MCA stroke (HCC) 06/30/2020   Acute ischemic stroke (HCC)    Acute stroke due to occlusion of left middle cerebral artery (HCC) 06/30/2020   Age-related nuclear cataract of both eyes 09/28/2015   Arthralgia of multiple joints 09/28/2015   Asthma    Borderline diabetes    Cerebrovascular accident (CVA) (HCC)  Chronic bilateral thoracic back pain 06/05/2018   Chronic renal insufficiency, stage 1 08/11/2014   Diabetes mellitus type 2 in obese    Dyslipidemia    Dysphagia, post-stroke    Erosive gastritis 07/20/2019   Formatting of this note might be different from the original. On EGD 07/2019   Esophagitis 07/20/2019   Formatting of this note might be different from the original. Added automatically from request for surgery [id number]  Formatting of this note might be different from the original. On EGD 07/2019   Essential hypertension 09/28/2015   Essential thrombocytosis (HCC) 09/28/2015   Gallstones 08/18/2019   Formatting of this note might be different from the original. Added automatically from request for surgery [id number]   Gastroesophageal reflux disease without esophagitis 09/28/2015   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   Hyperlipidemia, unspecified 03/16/2013   Hypertension    Keratoconjunctivitis sicca of both eyes not specified as Sjogren's 09/28/2015   Left middle cerebral artery stroke (HCC) 07/09/2020   Morbid obesity (HCC) 05/21/2013   Seizures (HCC)    Stage 3b chronic kidney  disease (HCC)    Status post gastric bypass for obesity 09/28/2015    Social History:  Social History   Socioeconomic History   Marital status: Divorced    Spouse name: Not on file   Number of children: Not on file   Years of education: Not on file   Highest education level: Bachelor's degree (e.g., BA, AB, BS)  Occupational History   Not on file  Tobacco Use   Smoking status: Never   Smokeless tobacco: Never  Vaping Use   Vaping status: Never Used  Substance and Sexual Activity   Alcohol use: No   Drug use: No   Sexual activity: Not on file  Other Topics Concern   Not on file  Social History Narrative   Lives with daughter Nettie Barb   Right Handed   Drinks no caffeine   Retired Engineer, 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[employer] [place]    Social Drivers of Health   Financial Resource Strain: High Risk (07/12/2023)   Overall Financial Resource Strain (CARDIA)    Difficulty of Paying Living Expenses: Hard  Food Insecurity: Low Risk  (09/17/2023)   Received from [facility]   Hunger Vital Sign    Worried About Running Out of Food in the Last Year: Never true    Ran Out of Food in the Last Year: Never true  Recent Concern: Food Insecurity - Food Insecurity Present (07/12/2023)   Hunger Vital Sign    Worried About Running Out of Food in the Last Year: Sometimes true    Ran Out of Food in the Last Year: Sometimes true  Transportation Needs: No Transportation Needs (09/17/2023)   Received from [facility]    In the past 12 months, has lack of reliable transportation kept you from medical appointments, meetings, work or from getting things needed for daily living? : No  Physical Activity: Insufficiently Active (07/12/2023)   Exercise Vital Sign    Days of Exercise per Week: 1 day    Minutes of Exercise per Session: 10 min  Stress: No Stress Concern Present (06/29/2022)   [employer] of Occupational Health - Occupational Stress  Questionnaire    Feeling of Stress : Not at all  Social Connections: Unknown (07/12/2023)   Social Connection and Isolation Panel [NHANES]    Frequency of Communication with Friends and Family: Three times  a week    Frequency of Social Gatherings with Friends and Family: Never    Attends Religious Services: Patient declined    Active Member of Clubs or Organizations: No    Attends Engineer, structural: Not on file    Marital Status: Patient declined  Intimate Partner Violence: Not At Risk (06/29/2022)   Humiliation, Afraid, Rape, and Kick questionnaire    Fear of Current or Ex-Partner: No    Emotionally Abused: No    Physically Abused: No    Sexually Abused: No    Medications:   Current Outpatient Medications on File Prior to Visit  Medication Sig Dispense Refill   acetaminophen  (TYLENOL ) 500 MG tablet Take 1,000 mg by mouth every 6 (six) hours as needed for moderate pain or headache.     amitriptyline  (ELAVIL ) 50 MG tablet Take 1 tablet (50 mg total) by mouth at bedtime. 90 tablet 3   amLODipine  (NORVASC ) 10 MG tablet Take 1 tablet (10 mg total) by mouth daily. 90 tablet 3   apixaban  (ELIQUIS ) 5 MG TABS tablet TAKE 1 TABLET BY MOUTH TWICE A DAY 60 tablet 5   aspirin  EC 81 MG tablet Take 1 tablet (81 mg total) by mouth daily. Swallow whole.     atorvastatin  (LIPITOR ) 80 MG tablet Take 1 tablet (80 mg total) by mouth daily. 90 tablet 3   cholecalciferol  (VITAMIN D3) 25 MCG (1000 UNIT) tablet Take by mouth.     KLOR-CON  M20 20 MEQ tablet Take 40 mEq by mouth 2 (two) times daily.     levETIRAcetam  (KEPPRA ) 750 MG tablet Take 1 tablet (750 mg total) by mouth 2 (two) times daily. 180 tablet 3   Multiple Vitamin (MULTIVITAMIN WITH MINERALS) TABS tablet Take 1 tablet by mouth daily.     NURTEC 75 MG TBDP TAKE 1 TABLET (75 MG TOTAL) BY MOUTH AS NEEDED. 16 tablet 5   omeprazole (PRILOSEC) 40 MG capsule Take 1 tablet by mouth daily.     pantoprazole  (PROTONIX ) 40 MG tablet Take 40 mg by  mouth daily.     sertraline  (ZOLOFT ) 25 MG tablet TAKE 1 TABLET (25 MG TOTAL) BY MOUTH DAILY. 90 tablet 1   topiramate  (TOPAMAX ) 100 MG tablet Take 1 tablet (100 mg total) by mouth 2 (two) times daily. Take 1 tablet twice daily 180 tablet 1   ursodiol  (ACTIGALL ) 250 MG tablet Take 250 mg by mouth 2 (two) times daily.     Vitamin D , Ergocalciferol , (DRISDOL ) 1.25 MG (50000 UNIT) CAPS capsule Take 1 capsule (50,000 Units total) by mouth every 7 (seven) days. 12 capsule 0   No current facility-administered medications on file prior to visit.    Allergies:   Allergies  Allergen Reactions   Homatropine Itching   Iodinated Contrast Media Itching and Other (See Comments)   Doxycycline Nausea And Vomiting and Other (See Comments)   Sulfa Antibiotics Rash and Hives   Codeine Hives and Swelling    Swollen tongue   Hydrocodone Itching   Hydrocodone Bit-Homatrop Mbr Itching   Sulfamethoxazole Hives   Ace Inhibitors Cough, Itching and Rash    Physical Exam Today's Vitals   09/26/23 1044  BP: 132/81  Pulse: 76  Weight: 180 lb (81.6 kg)  Height: 5\' 2"  (1.575 m)   Body mass index is 32.92 kg/m.   General: well developed, well nourished very pleasant middle-aged African-American lady, seated, moderately anxious  Neurologic Exam Mental Status: Awake and fully alert.  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  No evidence of dysarthria.  Able to follow simple step commands without difficulty. Difficulty answering a couple questions in a row, had difficulty transitioning from 1 question to another.  She would frequently speak off topic.  Recent memory impaired and remote memory intact. Attention span, concentration and fund of knowledge impaired during visit. Mood and affect anxious and tearful.   Cranial Nerves: Pupils equal, briskly reactive to light. Extraocular movements full without nystagmus. Visual fields full to confrontation. Hearing intact. Facial sensation intact.  Face,  tongue, palate moves normally and symmetrically.  Motor: Normal bulk and tone. Normal strength in all tested extremity muscles.  Sensory.: intact to touch ,pinprick .position and vibratory sensation.  Coordination: Rapid alternating movements normal in all extremities. Finger-to-nose and heel-to-shin performed accurately bilaterally. Gait and Station: Arises from chair without difficulty. Stance is normal. Gait demonstrates normal stride length with mild imbalance initially but stabilized without assistive device.   Reflexes: 1+ and symmetric. Toes downgoing.       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 ILR showed A fib 10/2020 - placed on Eliquis . Additional seizure 02/2021, restarted Keppra . 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  Discussed with daughter potential side effect/interaction between duloxetine  and amitriptyline .  If headaches persist, consider CRGP Continue topiramate  100 mg twice daily Continue amitriptyline  50 mg nightly Continue Nurtec as needed  Hx of R MCA and L MCA stroke Eliquis  5 mg twice daily, asa 81mg  daily and atorvastatin  80 mg daily for secondary stroke prevention managed/monitored by PCP/cardiology Continue routine follow-up with cardiology and PCP for  stroke risk factor management   Cognitive impairment Gait impairment Confusional episode Gait impairment poststroke.  Suspect some decline as she is sedentary and likely deconditioned.   Suspect decline in cognition gradually although unable to fully evaluate due to aphasia.  Underlying anxiety possibly contributing Referral placed to home health PT and SLP No recent confusional events, prior EEG 03/2023 no evidence of seizures, prior MRI 04/2023 negative for acute infarcts      Follow-up in 6 months or call earlier if needed    CC:  Viola Greulich, MD   I spent 40 minutes of face-to-face and non-face-to-face time with patient and brother and short telephone call with daughter.  This included previsit chart review, lab review, study review, order entry, electronic health record documentation, patient and brother education and discussion regarding above diagnoses and answered all other questions to patient and brothers and daughters satisfaction  Johny Nap, [facility]  [facility] [address] [place], [place] [address]  Phone [phone] Fax [phone] Note: This document was prepared with digital dictation and possible smart phrase technology. Any transcriptional errors that result from this process are unintentional.

## 2023-10-04 ENCOUNTER — Ambulatory Visit: Payer: Medicare Other

## 2023-10-07 ENCOUNTER — Telehealth: Payer: Self-pay | Admitting: Adult Health

## 2023-10-07 DIAGNOSIS — I63411 Cerebral infarction due to embolism of right middle cerebral artery: Secondary | ICD-10-CM

## 2023-10-07 DIAGNOSIS — I69398 Other sequelae of cerebral infarction: Secondary | ICD-10-CM

## 2023-10-07 NOTE — Telephone Encounter (Signed)
 The only placement we have been able to find for this patient is [facility] who can only accept for PT, not nursing. Is this ok? If so, can you reorder referral as home health PT only? Thank you.

## 2023-10-08 NOTE — Addendum Note (Signed)
 Addended by: Johny Nap L on: 10/08/2023 10:01 AM   Modules accepted: Orders

## 2023-10-08 NOTE — Telephone Encounter (Signed)
 Sent info to [facility], they will reach out to schedule patient.

## 2023-10-08 NOTE — Telephone Encounter (Signed)
 New order placed requesting PT through [facility]. Thank you.

## 2023-10-09 ENCOUNTER — Telehealth: Payer: Self-pay

## 2023-10-09 NOTE — Telephone Encounter (Signed)
 Copied from CRM [id number]. Topic: General - Other >> Oct 09, 2023  3:47 PM Adonis Hoot wrote: Reason for EAV:[id number] from [facility] called stating that patient would like them to come on Friday instead of tomorrow which is the 48 hour window.  She would like to know if that would be okay? Cb#: [phone]

## 2023-10-10 NOTE — Telephone Encounter (Signed)
 Patients appt has been canceled.

## 2023-10-11 ENCOUNTER — Telehealth: Payer: Self-pay

## 2023-10-11 NOTE — Telephone Encounter (Signed)
 Copied from CRM [id number]. Topic: Clinical - Home Health Verbal Orders >> Oct 11, 2023  1:01 PM Marlan Silva wrote: Caller/Agency: [facility]  Callback Number: [phone] secure line Service Requested: At Home PT Frequency: Starting next week twice a week for two weeks. One time a week for 3 weeks. Any new concerns about the patient? No

## 2023-10-14 NOTE — Telephone Encounter (Signed)
 Okay for PT sessions as requested. Thank you.

## 2023-10-14 NOTE — Telephone Encounter (Signed)
 Called the PT and left the VO order for PT for the pt

## 2023-10-16 ENCOUNTER — Ambulatory Visit: Payer: Self-pay | Admitting: Family Medicine

## 2023-10-16 ENCOUNTER — Telehealth: Payer: Self-pay | Admitting: Family Medicine

## 2023-10-16 NOTE — Telephone Encounter (Signed)
 Ok

## 2023-10-16 NOTE — Telephone Encounter (Signed)
 [facility] is calling to report pt will be d/c today due to insurance contract changes and pt will be readmitted sometime this week or next week. Pt bp is 92/60 and she is dizzy almira was transferred to triage nurse

## 2023-10-16 NOTE — Telephone Encounter (Signed)
 If continues f/u  in clinic or proceed to ED.

## 2023-10-16 NOTE — Telephone Encounter (Signed)
 Chief Complaint: Low blood pressure  Symptoms: Dizziness (now gone) Frequency: Once this morning  Disposition: [x] Home Care  Additional Notes: Spoke with pt's home physical therapist, Almria. Pt was dizzy this morning and her BP was 92/60. Pt was advised to drink water  by physical therapist. Pt current BP is 114/60 and HR is ranging between 60-74. Pt states she is no longer dizzy. This RN advised pt to continue drinking fluids and check her BP again throughout the day if she is able to. This RN advised pt to call back if she feels dizzy again or has any other symptoms. Understanding stated.   Reason for Disposition  Dizziness caused by not drinking enough fluids  Answer Assessment - Initial Assessment Questions BLOOD PRESSURE: "What is the blood pressure?" "Did you take at least two measurements 5 minutes apart?"     92/60 this morning and now 114/60 HOW: "How did you obtain the blood pressure?" (e.g., visiting nurse, automatic home BP monitor)     Home physical therapist HISTORY: "Do you have a history of low blood pressure?" "What is your blood pressure normally?"     "I don't think so" MEDICINES: "Are you taking any medications for blood pressure?" If Yes, ask: "Have they been changed recently?"     No PULSE RATE: "Do you know what your pulse rate is?"      60-74  Protocols used: Blood Pressure - Low-A-AH, Dizziness - Lightheadedness-A-AH

## 2023-10-20 ENCOUNTER — Other Ambulatory Visit: Payer: Self-pay | Admitting: Adult Health

## 2023-10-31 NOTE — Progress Notes (Signed)
 [facility] Summary Report / Loop Recorder

## 2023-11-25 ENCOUNTER — Other Ambulatory Visit: Payer: Self-pay

## 2023-11-25 ENCOUNTER — Encounter

## 2023-11-25 ENCOUNTER — Emergency Department (HOSPITAL_COMMUNITY)
Admission: EM | Admit: 2023-11-25 | Discharge: 2023-11-25 | Disposition: A | Discharge status: Home / Self Care | Attending: Emergency Medicine | Admitting: Emergency Medicine

## 2023-11-25 DIAGNOSIS — R55 Syncope and collapse: Secondary | ICD-10-CM | POA: Diagnosis present

## 2023-11-25 DIAGNOSIS — Z87898 Personal history of other specified conditions: Secondary | ICD-10-CM

## 2023-11-25 DIAGNOSIS — Z7982 Long term (current) use of aspirin: Secondary | ICD-10-CM | POA: Diagnosis not present

## 2023-11-25 DIAGNOSIS — G40909 Epilepsy, unspecified, not intractable, without status epilepticus: Secondary | ICD-10-CM | POA: Insufficient documentation

## 2023-11-25 DIAGNOSIS — E876 Hypokalemia: Secondary | ICD-10-CM | POA: Diagnosis not present

## 2023-11-25 DIAGNOSIS — Z7901 Long term (current) use of anticoagulants: Secondary | ICD-10-CM | POA: Diagnosis not present

## 2023-11-25 LAB — COMPREHENSIVE METABOLIC PANEL WITH GFR
ALT: 16 U/L (ref 0–44)
AST: 26 U/L (ref 15–41)
Albumin: 3.4 g/dL — ABNORMAL LOW (ref 3.5–5.0)
Alkaline Phosphatase: 140 U/L — ABNORMAL HIGH (ref 38–126)
Anion gap: 8 (ref 5–15)
BUN: 11 mg/dL (ref 8–23)
CO2: 20 mmol/L — ABNORMAL LOW (ref 22–32)
Calcium: 8.3 mg/dL — ABNORMAL LOW (ref 8.9–10.3)
Chloride: 112 mmol/L — ABNORMAL HIGH (ref 98–111)
Creatinine, Ser: 1.73 mg/dL — ABNORMAL HIGH (ref 0.44–1.00)
GFR, Estimated: 32 mL/min — ABNORMAL LOW (ref >60)
Glucose, Bld: 113 mg/dL — ABNORMAL HIGH (ref 70–99)
Potassium: 2.8 mmol/L — ABNORMAL LOW (ref 3.5–5.1)
Sodium: 140 mmol/L (ref 135–145)
Total Bilirubin: 1 mg/dL (ref 0.0–1.2)
Total Protein: 6.3 g/dL — ABNORMAL LOW (ref 6.5–8.1)

## 2023-11-25 LAB — URINALYSIS, ROUTINE W REFLEX MICROSCOPIC
Bacteria, UA: NONE SEEN
Bilirubin Urine: NEGATIVE
Glucose, UA: NEGATIVE mg/dL
Hgb urine dipstick: NEGATIVE
Ketones, ur: NEGATIVE mg/dL
Nitrite: NEGATIVE
Protein, ur: NEGATIVE mg/dL
Specific Gravity, Urine: 1.008 (ref 1.005–1.030)
pH: 6 (ref 5.0–8.0)

## 2023-11-25 LAB — CBC
HCT: 35.8 % — ABNORMAL LOW (ref 36.0–46.0)
Hemoglobin: 11.8 g/dL — ABNORMAL LOW (ref 12.0–15.0)
MCH: 27.9 pg (ref 26.0–34.0)
MCHC: 33 g/dL (ref 30.0–36.0)
MCV: 84.6 fL (ref 80.0–100.0)
Platelets: 371 10*3/uL (ref 150–400)
RBC: 4.23 MIL/uL (ref 3.87–5.11)
RDW: 13.8 % (ref 11.5–15.5)
WBC: 4.9 10*3/uL (ref 4.0–10.5)
nRBC: 0 % (ref 0.0–0.2)

## 2023-11-25 LAB — CBG MONITORING, ED: Glucose-Capillary: 106 mg/dL — ABNORMAL HIGH (ref 70–99)

## 2023-11-25 LAB — TROPONIN I (HIGH SENSITIVITY)
Troponin I (High Sensitivity): 9 ng/L (ref <18)
Troponin I (High Sensitivity): 8 ng/L (ref <18)

## 2023-11-25 LAB — PROTIME-INR
INR: 1.7 — ABNORMAL HIGH (ref 0.8–1.2)
Prothrombin Time: 19.8 s — ABNORMAL HIGH (ref 11.4–15.2)

## 2023-11-25 MED ORDER — POTASSIUM CHLORIDE CRYS ER 20 MEQ PO TBCR
40.0000 meq | EXTENDED_RELEASE_TABLET | Freq: Once | ORAL | Status: AC
  Administered 2023-11-25: 40 meq via ORAL
  Filled 2023-11-25: qty 2

## 2023-11-25 MED ORDER — POTASSIUM CHLORIDE CRYS ER 20 MEQ PO TBCR: 20.0000 meq | EXTENDED_RELEASE_TABLET | Freq: Two times a day (BID) | ORAL | 0 refills | Status: DC

## 2023-11-25 NOTE — Discharge Instructions (Addendum)
 Take your potassium as prescribed.  Drink lots of fluids.  Stay on your other medications as prescribed.  Call your primary care doctor and your neurologist to arrange a follow-up appointment.  Return to the ER for any new or worsening symptoms.

## 2023-11-25 NOTE — ED Provider Notes (Signed)
 [place] EMERGENCY DEPARTMENT AT [facility] Provider Note   CSN: [id number] Arrival date & time: 11/25/23  1128     Patient presents with: Near Syncope   Courtney Burke is a 68 y.o. female.   68 year old female presents for evaluation of syncopal episode that happened at therapy today.  Patient has aphasia and part of the history is provided by family that is at bedside.  They states she has had a couple episodes of seizures which she has a history of in feet over the last few weeks.  Patient states she fainted today and threw up afterward.  She states now she is feeling improved.  Family states she is on Keppra  and has not missed any doses.  They state that therapist had that her blood pressure has been low the last couple days with changing position.  Patient denies any other symptoms or concerns at this time.   Near Syncope Pertinent negatives include no chest pain, no abdominal pain and no shortness of breath.       Prior to Admission medications   Medication Sig Start Date End Date Taking? Authorizing Provider  potassium chloride  SA (KLOR-CON  M) 20 MEQ tablet Take 1 tablet (20 mEq total) by mouth 2 (two) times daily for 4 days. 11/25/23 11/29/23 Yes Aaron Bostwick L, DO  acetaminophen  (TYLENOL ) 500 MG tablet Take 1,000 mg by mouth every 6 (six) hours as needed for moderate pain or headache.    [provider]  amitriptyline  (ELAVIL ) 50 MG tablet Take 1 tablet (50 mg total) by mouth at bedtime. 03/06/23   Whitfield Raisin, NP  amLODipine  (NORVASC ) 10 MG tablet Take 1 tablet (10 mg total) by mouth daily. 08/14/23   [name], MD  apixaban  (ELIQUIS ) 5 MG TABS tablet TAKE 1 TABLET BY MOUTH TWICE A DAY 06/03/23   Camnitz, Soyla Lunger, MD  aspirin  EC 81 MG tablet Take 1 tablet (81 mg total) by mouth daily. Swallow whole. 06/29/21   Whitfield Raisin, NP  atorvastatin  (LIPITOR ) 80 MG tablet Take 1 tablet (80 mg total) by mouth daily. 08/14/23   [name],  MD  cholecalciferol  (VITAMIN D3) 25 MCG (1000 UNIT) tablet Take by mouth. 09/30/20   [provider]  DULoxetine  (CYMBALTA ) 30 MG capsule TAKE 1 CAPSULE BY MOUTH EVERY DAY 10/22/23   Whitfield Raisin, NP  KLOR-CON  M20 20 MEQ tablet Take 40 mEq by mouth 2 (two) times daily. 05/16/21   [provider]  levETIRAcetam  (KEPPRA ) 750 MG tablet Take 1 tablet (750 mg total) by mouth 2 (two) times daily. 03/06/23   Whitfield Raisin, NP  Multiple Vitamin (MULTIVITAMIN WITH MINERALS) TABS tablet Take 1 tablet by mouth daily.    [provider]  NURTEC 75 MG TBDP TAKE 1 TABLET (75 MG TOTAL) BY MOUTH AS NEEDED. 09/16/23   Whitfield Raisin, NP  omeprazole (PRILOSEC) 40 MG capsule Take 1 tablet by mouth daily. 07/28/20   [provider]  pantoprazole  (PROTONIX ) 40 MG tablet Take 40 mg by mouth daily.    [provider]  sertraline  (ZOLOFT ) 25 MG tablet TAKE 1 TABLET (25 MG TOTAL) BY MOUTH DAILY. 09/16/23   [name], MD  topiramate  (TOPAMAX ) 100 MG tablet Take 1 tablet (100 mg total) by mouth 2 (two) times daily. Take 1 tablet twice daily 08/14/23   [name], MD  ursodiol  (ACTIGALL ) 250 MG tablet Take 250 mg by mouth 2 (two) times daily. 07/28/20   [provider]  Vitamin D , Ergocalciferol , (DRISDOL ) 1.25 MG (50000 UNIT) CAPS capsule Take 1 capsule (50,000 Units total) by mouth every 7 (seven) days. 03/28/22   [name], MD    Allergies: Homatropine, Iodinated contrast media, Doxycycline, Sulfa antibiotics, Codeine, Hydrocodone, Hydrocodone bit-homatrop mbr, Sulfamethoxazole, and Ace inhibitors    Review of Systems  Constitutional:  Negative for chills and fever.  HENT:  Negative for ear pain and sore throat.   Eyes:  Negative for pain and visual disturbance.  Respiratory:  Negative for cough and shortness of breath.   Cardiovascular:  Positive for near-syncope. Negative for chest pain and palpitations.  Gastrointestinal:  Negative for abdominal  pain and vomiting.  Genitourinary:  Negative for dysuria and hematuria.  Musculoskeletal:  Negative for arthralgias and back pain.  Skin:  Negative for color change and rash.  Neurological:  Positive for seizures and syncope.  All other systems reviewed and are negative.   Updated Vital Signs BP 108/68   Pulse 64   Temp 97.8 F (36.6 C) (Oral)   Resp 17   Wt 81 kg   LMP 09/28/2012   SpO2 100%   BMI 32.66 kg/m   Physical Exam Vitals and nursing note reviewed.  Constitutional:      General: She is not in acute distress.    Appearance: Normal appearance. She is well-developed. She is not ill-appearing.  HENT:     Head: Normocephalic and atraumatic.   Eyes:     Conjunctiva/sclera: Conjunctivae normal.    Cardiovascular:     Rate and Rhythm: Normal rate and regular rhythm.     Heart sounds: Normal heart sounds. No murmur heard. Pulmonary:     Effort: Pulmonary effort is normal. No respiratory distress.     Breath sounds: Normal breath sounds.  Abdominal:     Palpations: Abdomen is soft.     Tenderness: There is no abdominal tenderness.   Musculoskeletal:        General: No swelling.     Cervical back: Neck supple.   Skin:    General: Skin is warm and dry.     Capillary Refill: Capillary refill takes less than 2 seconds.   Neurological:     Mental Status: She is alert. Mental status is at baseline.     Comments: Patient with aphasia  Psychiatric:        Mood and Affect: Mood normal.     (all labs ordered are listed, but only abnormal results are displayed) Labs Reviewed  COMPREHENSIVE METABOLIC PANEL WITH GFR - Abnormal; Notable for the following components:      Result Value   Potassium 2.8 (*)    Chloride 112 (*)    CO2 20 (*)    Glucose, Bld 113 (*)    Creatinine, Ser 1.73 (*)    Calcium  8.3 (*)    Total Protein 6.3 (*)    Albumin 3.4 (*)    Alkaline Phosphatase 140 (*)    GFR, Estimated 32 (*)    All other components within normal limits  CBC -  Abnormal; Notable for the following components:   Hemoglobin 11.8 (*)    HCT 35.8 (*)    All other components within normal limits  URINALYSIS, ROUTINE W REFLEX MICROSCOPIC - Abnormal; Notable for the following components:   APPearance HAZY (*)    Leukocytes,Ua SMALL (*)    All other components within normal limits  PROTIME-INR - Abnormal; Notable for the following components:   Prothrombin Time 19.8 (*)  INR 1.7 (*)    All other components within normal limits  CBG MONITORING, ED - Abnormal; Notable for the following components:   Glucose-Capillary 106 (*)    All other components within normal limits  TROPONIN I (HIGH SENSITIVITY)  TROPONIN I (HIGH SENSITIVITY)    EKG: EKG Interpretation Date/Time:  Monday November 25 2023 11:54:28 EDT Ventricular Rate:  69 PR Interval:  166 QRS Duration:  80 QT Interval:  416 QTC Calculation: 445 R Axis:   -11  Text Interpretation: Normal sinus rhythm Minimal voltage criteria for LVH, may be normal variant ( R in aVL ) flattened T waves in lateral leads no STEMI LVH is new when compared with prior EKG from 04/09/23 Confirmed by Gennaro Bouchard ([id number]) on 11/25/2023 11:28:09 PM  Radiology: No results found.   Procedures   Medications Ordered in the ED  potassium chloride  SA (KLOR-CON  M) CR tablet 40 mEq (40 mEq Oral Given 11/25/23 1716)                                    Medical Decision Making Medical Decision Making Nursing notes are reviewed. Differential diagnosis for this patient would include but not limited to: Syncope, seizure, orthostasis, electro abnormality, dehydration, other  Records reviewed: Outpatient records reviewed and patient follows with neurology and last seen 09-26-2023 for CVA  EKG interpretation: Interpreted by me in the absence of cardiology shows sinus rhythm, LVH, no STEMI and LVH is new compared to prior EKG  Cardiac monitor interpretation: sinus rhythm, no ectopy  Emergency Department Course:  Vital  signs and pulse oximetry are reviewed, evaluated by myself and found to be within normal limits prior to final disposition. Findings of laboratory testing and medical imaging are discussed with patient and family that is available. Patient agrees with the medical care plan as follows:  Patient's lab workup reviewed by me and she has hypokalemia and creatinine is baseline.  Labs are otherwise fairly unremarkable.  Orthostatics were negative.  She is feeling improved.  She may have had a breakthrough seizure due to her hypokalemia and I advised her to stay on her medications as prescribed.  She has not missed any doses.  Troponins are negative x 2 and she is feeling much better.  Family will plan to have the patient follow-up with her specialist and primary care doctor and she is advised return to the ER for any worsening symptoms.  They felt comfortable with the plan be discharged home.  She will be given prescription for potassium for few days was given 1 dose here.  As well as IV fluids  Problems Addressed: History of seizures: chronic illness or injury Hypokalemia: acute illness or injury Syncope, unspecified syncope type: undiagnosed new problem with uncertain prognosis  Amount and/or Complexity of Data Reviewed Independent Historian: caregiver    Details: Patient has aphasia and caregiver help to provide history External Data Reviewed: notes. Labs: ordered. Decision-making details documented in ED Course. ECG/medicine tests: ordered and independent interpretation performed. Decision-making details documented in ED Course.  Risk OTC drugs. Prescription drug management. Drug therapy requiring intensive monitoring for toxicity.    Final diagnoses:  Hypokalemia  Syncope, unspecified syncope type  History of seizures    ED Discharge Orders          Ordered    potassium chloride  SA (KLOR-CON  M) 20 MEQ tablet  2 times daily  11/25/23 1733               Gennaro Duwaine CROME, DO 11/25/23 2330

## 2023-11-25 NOTE — ED Triage Notes (Signed)
 PT BIB EMS from home. Pt was getting PT at home. Had a syncopal episode while seated for approx 30 seconds. No injury no trauma. Pt has hx of seizures. Pt is at baseline A&O x 4. Pt + orthostatic for EMS.   EMS VS Cbg 126 Hr 54 100% RA 18 RR 119/60

## 2023-11-25 NOTE — ED Provider Triage Note (Signed)
 Emergency Medicine Provider Triage Evaluation Note  Courtney Burke , a 68 y.o. female  was evaluated in triage.  Pt complains of syncope during PT.  History of aphasia BioLon history.    Physical Exam  Wt 81 kg   LMP 09/28/2012   BMI 32.66 kg/m  Patient with aphasia  Medical Decision Making  Medically screening exam initiated at 11:45 AM.  Appropriate orders placed.  Courtney Burke was informed that the remainder of the evaluation will be completed by another provider, this initial triage assessment does not replace that evaluation, and the importance of remaining in the ED until their evaluation is complete.  Patient with syncope.  Does have a history of aphasia so difficult to get history from.  Will get blood work EKG and chest   Courtney Lot, MD 11/25/23 1147

## 2023-11-26 ENCOUNTER — Telehealth: Payer: Self-pay

## 2023-11-26 NOTE — Telephone Encounter (Signed)
 Copied from CRM [id number]. Topic: Clinical - Home Health Verbal Orders >> Nov 26, 2023  1:28 PM Suzen RAMAN wrote: Caller/Agency: [facility] Callback Number: [phone] Service Requested: Physical Therapy Frequency: 2x a week for 3 weeks  Any new concerns about the patient? Yes, seen in ER yesterday. D/C 11/25/23 for low potassium

## 2023-11-27 NOTE — Telephone Encounter (Signed)
 Ok

## 2023-11-27 NOTE — Telephone Encounter (Signed)
 Called and Left a VM [facility], VO given for P.T.

## 2023-11-30 ENCOUNTER — Encounter (HOSPITAL_COMMUNITY): Payer: Self-pay | Admitting: Interventional Radiology

## 2023-12-21 ENCOUNTER — Other Ambulatory Visit (HOSPITAL_COMMUNITY): Payer: Self-pay | Admitting: Cardiology

## 2023-12-21 DIAGNOSIS — I480 Paroxysmal atrial fibrillation: Secondary | ICD-10-CM

## 2023-12-23 NOTE — Telephone Encounter (Signed)
 Eliquis  5mg  refill request received. Patient is 68 years old, weight-81kg, Crea-1.73 on 11/25/23, Diagnosis-Afib, and last seen by Charlies Arthur on 04/09/23. Dose is appropriate based on dosing criteria. Will send in refill to requested pharmacy.

## 2023-12-26 ENCOUNTER — Encounter

## 2024-01-01 ENCOUNTER — Ambulatory Visit: Admitting: Family Medicine

## 2024-01-01 ENCOUNTER — Encounter: Payer: Self-pay | Admitting: Family Medicine

## 2024-01-01 VITALS — BP 112/66 | HR 67 | Temp 98.8°F | Ht 62.0 in | Wt 174.0 lb

## 2024-01-01 DIAGNOSIS — L816 Other disorders of diminished melanin formation: Secondary | ICD-10-CM

## 2024-01-01 DIAGNOSIS — R0989 Other specified symptoms and signs involving the circulatory and respiratory systems: Secondary | ICD-10-CM | POA: Diagnosis not present

## 2024-01-01 DIAGNOSIS — M79672 Pain in left foot: Secondary | ICD-10-CM | POA: Diagnosis not present

## 2024-01-01 DIAGNOSIS — Z8673 Personal history of transient ischemic attack (TIA), and cerebral infarction without residual deficits: Secondary | ICD-10-CM

## 2024-01-01 DIAGNOSIS — W19XXXA Unspecified fall, initial encounter: Secondary | ICD-10-CM | POA: Diagnosis not present

## 2024-01-01 DIAGNOSIS — R42 Dizziness and giddiness: Secondary | ICD-10-CM

## 2024-01-01 DIAGNOSIS — N1832 Chronic kidney disease, stage 3b: Secondary | ICD-10-CM | POA: Diagnosis not present

## 2024-01-01 DIAGNOSIS — E876 Hypokalemia: Secondary | ICD-10-CM

## 2024-01-01 DIAGNOSIS — Z09 Encounter for follow-up examination after completed treatment for conditions other than malignant neoplasm: Secondary | ICD-10-CM

## 2024-01-01 DIAGNOSIS — G40909 Epilepsy, unspecified, not intractable, without status epilepticus: Secondary | ICD-10-CM

## 2024-01-01 LAB — COMPREHENSIVE METABOLIC PANEL WITH GFR
ALT: 14 U/L (ref 0–35)
AST: 24 U/L (ref 0–37)
Albumin: 3.9 g/dL (ref 3.5–5.2)
Alkaline Phosphatase: 145 U/L — ABNORMAL HIGH (ref 39–117)
BUN: 19 mg/dL (ref 6–23)
CO2: 23 meq/L (ref 19–32)
Calcium: 8.8 mg/dL (ref 8.4–10.5)
Chloride: 116 meq/L — ABNORMAL HIGH (ref 96–112)
Creatinine, Ser: 2.04 mg/dL — ABNORMAL HIGH (ref 0.40–1.20)
GFR: 24.45 mL/min — ABNORMAL LOW (ref >60.00)
Glucose, Bld: 95 mg/dL (ref 70–99)
Potassium: 3.7 meq/L (ref 3.5–5.1)
Sodium: 148 meq/L — ABNORMAL HIGH (ref 135–145)
Total Bilirubin: 0.4 mg/dL (ref 0.2–1.2)
Total Protein: 6.7 g/dL (ref 6.0–8.3)

## 2024-01-01 LAB — POCT URINALYSIS DIPSTICK
Bilirubin, UA: NEGATIVE
Blood, UA: NEGATIVE
Glucose, UA: NEGATIVE
Ketones, UA: NEGATIVE
Nitrite, UA: NEGATIVE
Protein, UA: POSITIVE — AB
Spec Grav, UA: 1.015 (ref 1.010–1.025)
Urobilinogen, UA: 0.2 U/dL
pH, UA: 6 (ref 5.0–8.0)

## 2024-01-01 LAB — CBC WITH DIFFERENTIAL/PLATELET
Basophils Absolute: 0 K/uL (ref 0.0–0.1)
Basophils Relative: 0.8 % (ref 0.0–3.0)
Eosinophils Absolute: 0.2 K/uL (ref 0.0–0.7)
Eosinophils Relative: 4.6 % (ref 0.0–5.0)
HCT: 33.3 % — ABNORMAL LOW (ref 36.0–46.0)
Hemoglobin: 10.9 g/dL — ABNORMAL LOW (ref 12.0–15.0)
Lymphocytes Relative: 32.8 % (ref 12.0–46.0)
Lymphs Abs: 1.8 K/uL (ref 0.7–4.0)
MCHC: 32.6 g/dL (ref 30.0–36.0)
MCV: 83.9 fl (ref 78.0–100.0)
Monocytes Absolute: 0.3 K/uL (ref 0.1–1.0)
Monocytes Relative: 5.9 % (ref 3.0–12.0)
Neutro Abs: 3 K/uL (ref 1.4–7.7)
Neutrophils Relative %: 55.9 % (ref 43.0–77.0)
Platelets: 334 K/uL (ref 150.0–400.0)
RBC: 3.96 Mil/uL (ref 3.87–5.11)
RDW: 13.7 % (ref 11.5–15.5)
WBC: 5.4 K/uL (ref 4.0–10.5)

## 2024-01-01 LAB — MAGNESIUM: Magnesium: 1.9 mg/dL (ref 1.5–2.5)

## 2024-01-01 LAB — TSH: TSH: 3.73 u[IU]/mL (ref 0.35–5.50)

## 2024-01-01 NOTE — Patient Instructions (Signed)
 A referral to vascular surgery was placed.  They will contact you about setting up an appointment.
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
## 2024-01-02 LAB — URINE CULTURE
MICRO NUMBER:: 16764881
Result:: NO GROWTH
SPECIMEN QUALITY:: ADEQUATE

## 2024-01-04 ENCOUNTER — Ambulatory Visit: Payer: Self-pay | Admitting: Family Medicine

## 2024-01-04 DIAGNOSIS — R748 Abnormal levels of other serum enzymes: Secondary | ICD-10-CM

## 2024-01-04 DIAGNOSIS — D649 Anemia, unspecified: Secondary | ICD-10-CM

## 2024-01-04 DIAGNOSIS — N184 Chronic kidney disease, stage 4 (severe): Secondary | ICD-10-CM

## 2024-01-07 ENCOUNTER — Ambulatory Visit
Admission: RE | Admit: 2024-01-07 | Discharge: 2024-01-07 | Disposition: A | Discharge status: Home / Self Care | Source: Ambulatory Visit | Attending: Family Medicine | Admitting: Family Medicine

## 2024-01-07 DIAGNOSIS — R748 Abnormal levels of other serum enzymes: Secondary | ICD-10-CM

## 2024-01-10 ENCOUNTER — Other Ambulatory Visit: Payer: Self-pay | Admitting: *Deleted

## 2024-01-10 DIAGNOSIS — R0989 Other specified symptoms and signs involving the circulatory and respiratory systems: Secondary | ICD-10-CM

## 2024-01-24 ENCOUNTER — Other Ambulatory Visit: Payer: Self-pay | Admitting: Family Medicine

## 2024-01-24 ENCOUNTER — Other Ambulatory Visit (INDEPENDENT_AMBULATORY_CARE_PROVIDER_SITE_OTHER)

## 2024-01-24 ENCOUNTER — Ambulatory Visit: Payer: Self-pay | Admitting: Family Medicine

## 2024-01-24 ENCOUNTER — Telehealth: Payer: Self-pay

## 2024-01-24 DIAGNOSIS — D649 Anemia, unspecified: Secondary | ICD-10-CM

## 2024-01-24 DIAGNOSIS — K7689 Other specified diseases of liver: Secondary | ICD-10-CM

## 2024-01-24 DIAGNOSIS — R748 Abnormal levels of other serum enzymes: Secondary | ICD-10-CM

## 2024-01-24 DIAGNOSIS — N184 Chronic kidney disease, stage 4 (severe): Secondary | ICD-10-CM | POA: Diagnosis not present

## 2024-01-24 DIAGNOSIS — E876 Hypokalemia: Secondary | ICD-10-CM

## 2024-01-24 LAB — COMPREHENSIVE METABOLIC PANEL WITH GFR
ALT: 14 U/L (ref 0–35)
AST: 25 U/L (ref 0–37)
Albumin: 3.8 g/dL (ref 3.5–5.2)
Alkaline Phosphatase: 156 U/L — ABNORMAL HIGH (ref 39–117)
BUN: 15 mg/dL (ref 6–23)
CO2: 22 meq/L (ref 19–32)
Calcium: 8.3 mg/dL — ABNORMAL LOW (ref 8.4–10.5)
Chloride: 110 meq/L (ref 96–112)
Creatinine, Ser: 2.06 mg/dL — ABNORMAL HIGH (ref 0.40–1.20)
GFR: 24.15 mL/min — ABNORMAL LOW (ref >60.00)
Glucose, Bld: 85 mg/dL (ref 70–99)
Potassium: 2.6 meq/L — CL (ref 3.5–5.1)
Sodium: 143 meq/L (ref 135–145)
Total Bilirubin: 0.9 mg/dL (ref 0.2–1.2)
Total Protein: 6.7 g/dL (ref 6.0–8.3)

## 2024-01-24 LAB — CBC WITH DIFFERENTIAL/PLATELET
Basophils Absolute: 0 K/uL (ref 0.0–0.1)
Basophils Relative: 0.7 % (ref 0.0–3.0)
Eosinophils Absolute: 0.2 K/uL (ref 0.0–0.7)
Eosinophils Relative: 4.3 % (ref 0.0–5.0)
HCT: 32.8 % — ABNORMAL LOW (ref 36.0–46.0)
Hemoglobin: 10.8 g/dL — ABNORMAL LOW (ref 12.0–15.0)
Lymphocytes Relative: 36.5 % (ref 12.0–46.0)
Lymphs Abs: 1.6 K/uL (ref 0.7–4.0)
MCHC: 33.1 g/dL (ref 30.0–36.0)
MCV: 82.3 fl (ref 78.0–100.0)
Monocytes Absolute: 0.4 K/uL (ref 0.1–1.0)
Monocytes Relative: 8.3 % (ref 3.0–12.0)
Neutro Abs: 2.2 K/uL (ref 1.4–7.7)
Neutrophils Relative %: 50.2 % (ref 43.0–77.0)
Platelets: 342 K/uL (ref 150.0–400.0)
RBC: 3.98 Mil/uL (ref 3.87–5.11)
RDW: 13.6 % (ref 11.5–15.5)
WBC: 4.4 K/uL (ref 4.0–10.5)

## 2024-01-24 MED ORDER — POTASSIUM CHLORIDE CRYS ER 20 MEQ PO TBCR: EXTENDED_RELEASE_TABLET | ORAL | 1 refills | Status: DC

## 2024-01-24 NOTE — Telephone Encounter (Signed)
 Provider aware and pt made aware.

## 2024-01-24 NOTE — Telephone Encounter (Signed)
 CRITICAL VALUE STICKER  CRITICAL VALUE:  Potassium low at 2.6  RECEIVER (on-site recipient of call):  DATE & TIME NOTIFIED:   MESSENGER (representative from lab): Hope from Post lab   MD NOTIFIED: Dr. Mercer  TIME OF NOTIFICATION:  RESPONSE:

## 2024-01-27 ENCOUNTER — Encounter

## 2024-01-27 NOTE — Telephone Encounter (Signed)
 Copied from CRM #[id number]. Topic: Clinical - Prescription Issue >> Jan 24, 2024  5:29 PM Tinnie BROCKS wrote: Reason for CRM: Daughter [name] calling in to let us  know the pharmacy says they do not have potassium chloride  SA (KLOR-CON  M) 20 MEQ tablet refill. It looks like on our end receipt was confirmed with pharmacy less than an hour ago. I let her know to try again later or possibly tomorrow. Unable to reach out to pharmacy since it is after hours. She will take pt to hospital if they are unable to get refill. She says this was the plan of action given to her via Mercer' nurse earlier.
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
## 2024-02-12 NOTE — Progress Notes (Unsigned)
 Patient ID: Courtney Burke, female   DOB: 01-02-1956, 68 y.o.   MRN: [id number]  Reason for Consult: No chief complaint on file.   Referred by [name], MD  Subjective:     HPI Courtney Burke is a 69 y.o. female presenting for evaluation of discoloration and pain in her left foot. ***  Past Medical History:  Diagnosis Date   Acute ischemic left MCA stroke (HCC) 06/30/2020   Acute ischemic stroke (HCC)    Acute stroke due to occlusion of left middle cerebral artery (HCC) 06/30/2020   Age-related nuclear cataract of both eyes 09/28/2015   Arthralgia of multiple joints 09/28/2015   Asthma    Borderline diabetes    Cerebrovascular accident (CVA) (HCC)    Chronic bilateral thoracic back pain 06/05/2018   Chronic renal insufficiency, stage 1 08/11/2014   Diabetes mellitus type 2 in obese    Dyslipidemia    Dysphagia, post-stroke    Erosive gastritis 07/20/2019   Formatting of this note might be different from the original. On EGD 07/2019   Esophagitis 07/20/2019   Formatting of this note might be different from the original. Added automatically from request for surgery [id number]  Formatting of this note might be different from the original. On EGD 07/2019   Essential hypertension 09/28/2015   Essential thrombocytosis (HCC) 09/28/2015   Gallstones 08/18/2019   Formatting of this note might be different from the original. 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   Morbid obesity (HCC) 05/21/2013   Seizures (HCC)    Stage 3b chronic kidney disease (HCC)    Status post gastric bypass for obesity 09/28/2015   Family History  Problem Relation Age of Onset    Stroke Maternal Grandfather    Past Surgical History:  Procedure Laterality Date   ANKLE SURGERY     CARPAL TUNNEL RELEASE     carpel tunnel     ESOPHAGOGASTRODUODENOSCOPY (EGD) WITH PROPOFOL  N/A 11/14/2021   Procedure: ESOPHAGOGASTRODUODENOSCOPY (EGD) WITH PROPOFOL ;  Surgeon: Avram Lupita BRAVO, MD;  Location: [facility] ENDOSCOPY;  Service: Gastroenterology;  Laterality: N/A;   IR ANGIO INTRA EXTRACRAN SEL COM CAROTID INNOMINATE BILAT MOD SED  01/17/2021   IR ANGIO VERTEBRAL SEL SUBCLAVIAN INNOMINATE UNI R MOD SED  01/17/2021   IR CT HEAD LTD  06/30/2020   IR INTRA CRAN STENT  06/30/2020   IR PERCUTANEOUS ART THROMBECTOMY/INFUSION INTRACRANIAL INC DIAG ANGIO  06/30/2020   IR RADIOLOGIST EVAL & MGMT  08/26/2020   IR US  GUIDE VASC ACCESS RIGHT  01/17/2021   RADIOLOGY WITH ANESTHESIA N/A 06/30/2020   Procedure: RADIOLOGY WITH ANESTHESIA;  Surgeon: Radiologist, Medication, MD;  Location: [facility] OR;  Service: Radiology;  Laterality: N/A;    Short Social History:  Social History   Tobacco Use   Smoking status: Never   Smokeless tobacco: Never  Substance Use Topics   Alcohol use: No    Allergies  Allergen Reactions   Homatropine Itching   Iodinated Contrast Media Itching and Other (See Comments)   Doxycycline Nausea And Vomiting and Other (See Comments)   Sulfa Antibiotics Rash and Hives   Codeine Hives and Swelling    Swollen tongue   Hydrocodone Itching   Hydrocodone Bit-Homatrop Mbr Itching  Sulfamethoxazole Hives   Ace Inhibitors Cough, Itching and Rash    Current Outpatient Medications  Medication Sig Dispense Refill   acetaminophen  (TYLENOL ) 500 MG tablet Take 1,000 mg by mouth every 6 (six) hours as needed for moderate pain or headache.     amitriptyline  (ELAVIL ) 50 MG tablet Take 1 tablet (50 mg total) by mouth at bedtime. 90 tablet 3   amLODipine  (NORVASC ) 10 MG tablet Take 1 tablet (10 mg total) by mouth daily. 90 tablet 3   aspirin  EC 81 MG tablet Take 1 tablet (81 mg total) by mouth  daily. Swallow whole.     atorvastatin  (LIPITOR ) 80 MG tablet Take 1 tablet (80 mg total) by mouth daily. 90 tablet 3   cholecalciferol  (VITAMIN D3) 25 MCG (1000 UNIT) tablet Take by mouth.     DULoxetine  (CYMBALTA ) 30 MG capsule TAKE 1 CAPSULE BY MOUTH EVERY DAY 90 capsule 4   ELIQUIS  5 MG TABS tablet TAKE 1 TABLET BY MOUTH TWICE A DAY 60 tablet 5   KLOR-CON  M20 20 MEQ tablet Take 40 mEq by mouth 2 (two) times daily.     levETIRAcetam  (KEPPRA ) 750 MG tablet Take 1 tablet (750 mg total) by mouth 2 (two) times daily. 180 tablet 3   Multiple Vitamin (MULTIVITAMIN WITH MINERALS) TABS tablet Take 1 tablet by mouth daily.     NURTEC 75 MG TBDP TAKE 1 TABLET (75 MG TOTAL) BY MOUTH AS NEEDED. 16 tablet 5   omeprazole (PRILOSEC) 40 MG capsule Take 1 tablet by mouth daily.     pantoprazole  (PROTONIX ) 40 MG tablet Take 40 mg by mouth daily.     potassium chloride  SA (KLOR-CON  M) 20 MEQ tablet Take 2 tabs (40mEq) daily for 4 days.  Then take 1 tab (20 mEq) daily. 90 tablet 1   sertraline  (ZOLOFT ) 25 MG tablet TAKE 1 TABLET (25 MG TOTAL) BY MOUTH DAILY. 90 tablet 1   topiramate  (TOPAMAX ) 100 MG tablet Take 1 tablet (100 mg total) by mouth 2 (two) times daily. Take 1 tablet twice daily 180 tablet 1   ursodiol  (ACTIGALL ) 250 MG tablet Take 250 mg by mouth 2 (two) times daily.     Vitamin D , Ergocalciferol , (DRISDOL ) 1.25 MG (50000 UNIT) CAPS capsule Take 1 capsule (50,000 Units total) by mouth every 7 (seven) days. 12 capsule 0   No current facility-administered medications for this visit.    REVIEW OF SYSTEMS  All other systems were reviewed and are negative     Objective:  Objective   There were no vitals filed for this visit. There is no height or weight on file to calculate BMI.  Physical Exam General: no acute distress Cardiac: hemodynamically stable Pulm: normal work of breathing Abdomen: non-tender, no pulsatile mass*** Neuro: alert, no focal deficit Extremities: no edema, cyanosis or  wounds*** Vascular:   Right: ***  Left: ***  Data: ABI ***  CMP reviewed, creatinine 2.06  Echo from 2022 reviewed EF 55 to 60%, mildly elevated RVSP 36, mild to moderate tricuspid regurg.     Assessment/Plan:   Courtney Burke is a 68 y.o. female with ***  Recommendations to optimize cardiovascular risk: Abstinence from all tobacco products. Blood glucose control with goal A1c < 7%. Blood pressure control with goal blood pressure < 140/90 mmHg. Lipid reduction therapy with goal LDL-C <100 mg/dL  Aspirin  81mg  PO QD.  Atorvastatin  40-80mg  PO QD (or other high intensity statin therapy).   Norman GORMAN Serve MD [facility]

## 2024-02-13 ENCOUNTER — Ambulatory Visit (INDEPENDENT_AMBULATORY_CARE_PROVIDER_SITE_OTHER): Admitting: Adult Health

## 2024-02-13 ENCOUNTER — Other Ambulatory Visit: Payer: Self-pay | Admitting: Family Medicine

## 2024-02-13 ENCOUNTER — Encounter: Payer: Self-pay | Admitting: Adult Health

## 2024-02-13 VITALS — BP 101/68 | HR 68 | Ht 62.0 in | Wt 164.0 lb

## 2024-02-13 DIAGNOSIS — G43909 Migraine, unspecified, not intractable, without status migrainosus: Secondary | ICD-10-CM

## 2024-02-13 DIAGNOSIS — E876 Hypokalemia: Secondary | ICD-10-CM | POA: Diagnosis not present

## 2024-02-13 DIAGNOSIS — R4189 Other symptoms and signs involving cognitive functions and awareness: Secondary | ICD-10-CM

## 2024-02-13 DIAGNOSIS — Z5181 Encounter for therapeutic drug level monitoring: Secondary | ICD-10-CM

## 2024-02-13 DIAGNOSIS — I63411 Cerebral infarction due to embolism of right middle cerebral artery: Secondary | ICD-10-CM

## 2024-02-13 DIAGNOSIS — I69398 Other sequelae of cerebral infarction: Secondary | ICD-10-CM | POA: Diagnosis not present

## 2024-02-13 DIAGNOSIS — G40919 Epilepsy, unspecified, intractable, without status epilepticus: Secondary | ICD-10-CM

## 2024-02-13 DIAGNOSIS — G40909 Epilepsy, unspecified, not intractable, without status epilepticus: Secondary | ICD-10-CM

## 2024-02-13 MED ORDER — LEVETIRACETAM 750 MG PO TABS: 750.0000 mg | ORAL_TABLET | Freq: Two times a day (BID) | ORAL | 3 refills | Status: DC

## 2024-02-13 MED ORDER — VALTOCO 15 MG DOSE 2 X 7.5 MG/0.1ML NA LQPK: 15.0000 mg | NASAL | 5 refills | Status: AC | PRN

## 2024-02-13 MED ORDER — TOPIRAMATE 100 MG PO TABS: 100.0000 mg | ORAL_TABLET | Freq: Two times a day (BID) | ORAL | 3 refills | Status: AC

## 2024-02-13 MED ORDER — AMITRIPTYLINE HCL 50 MG PO TABS: 50.0000 mg | ORAL_TABLET | Freq: Every day | ORAL | 3 refills | Status: AC

## 2024-02-13 NOTE — Progress Notes (Signed)
 [facility] [address] [place]. [place] [address] [phone]       OFFICE FOLLOW-UP NOTE  [name] Date of Birth:  Dec 30, 1955 Medical Record Number:  [id number]    Reason for visit: Stroke, seizure, headaches Primary neurologist: Dr. Rosemarie  Chief Complaint  Patient presents with   Cerebrovascular Accident    Rm 3 with daughter Pt is well, daughter reports last Sunday she believes she may have had a seizure. No new stroke concerns she is aware. Headaches have been stable as far as daughter is aware, pt has not complained of any.      HPI:   Update 02/13/2024 JM: Patient returns for follow-up visit after prior visit 5 months ago accompanied by her daughter who provides majority of history.  At that time, she was advised to continue Keppra  750 mg twice daily and topiramate  100 mg twice daily.  She was started on duloxetine  for daily headaches.   She was seen in the ED back in June after syncopal episode.  Evaluated in ED who noted possible breakthrough seizure in setting of hypokalemia, she was prescribed potassium and advised to follow-up with PCP.  Reported compliance on Keppra  and topiramate .  Daughter reports possible seizure activity last Sunday, 8/31. She was sitting on the couch waiting to go to a funeral for a friend of the family, she started to stare off and verbally unresponsive and then started gagging like she was going to throw up. Daughter layed her on her side and patient started coming to, was initially combative as she thought daughter was trying to attack, she returned back to baseline without postictal symptoms, daughter believes whole event lasted about 5 minutes.   Did have repeat lab work with PCP on 8/22 which showed persistent hypokalemia (2.6) and advised to restart on potassium supplement. Reports taking potassium supplement twice daily.  She has follow-up visit tomorrow with PCP.  Patient has not complained of any  recent headaches.  She has continued on duloxetine  30 mg daily as well as amitriptyline  50 mg nightly.  Daughter also mentions increase in agitation as well as episodes of paranoia.  She can easily lose items and then blame her family members for taking them.  This has caused a strain on her and her daughters relationship.  Daughter notes improvement of overall mood when she has someone to go during the day even if it is a doctor's appointment.  She questions participation in adult day program. While speaking of this, daughter noticed patient staring off with head turned slightly towards the left and eyes fluttering, patient would not respond verbally, daughter would move patients head straight on and patients head would move back looking towards the left. Episode lasted approx 60 seconds. No extremity jerking, loss of consciousness, tongue biting or urinary incontinence. She did feel confused and fatigued after. Blood pressure after event 92/62 and on recheck 101/68, denied symptomatic symptoms.       History provided for reference purposes only Update 09/26/2023 JM: Patient returns for scheduled follow-up visit accompanied by her brother.   She was seen in the ED back in February with 2-minute seizure-like activity with staring off.  Noted taking an adequate dose of topiramate , only taking 25 mg twice daily instead of prescribed 100 mg twice daily, reported compliance with Keppra .   No additional seizure activity since taking correct dose of topiramate  100 mg twice daily in addition to Keppra  750mg  BID (lowered after prior visit d/t kidney impairment).  Tolerating without side effects.  She reports continued headaches that can be associated with dizziness. Occurs daily. She feels this limits her activity and functioning.  Continues on amitriptyline .  She reports taking Tylenol  twice daily but did have a short conversation with her daughter prior to end of visit and noted only taking 1 Tylenol  a day  more recently after finger trigger release surgery.  Persistent aphasia, family does feel it has been gradually improving.  No recent confusional episodes.  Gait impaired, daughter reports difficulty getting her up and moving some days, frequently refuses exercise.  No new stroke/[name] symptoms. Reports compliance on Eliquis , aspirin  and atorvastatin  without side effects.  Routinely follows with PCP and cardiology.  Update 03/06/2023 JM: Patient returns for follow-up visit accompanied by her daughter. Daughter reports a fall after tripping on a stick over the weekend while she was out with her friends and has been more fatigued since, she has been staying in her bed the past several days despite family trying to discourage this. Per daughter, patient has not complained of any worsening headaches (denies hitting head with fall), no new weakness or change in speech. Upon arrival to today's visit with CMA, patient had difficulty stating her birthday (which she is normally able to do), she continued to focus on her weight after she was weighed initially. Later during visit, she was asked her name which she was able to state and then when asked her birthday, she kept repeatedly trying to say her name, she was later able to state her birthday. Per daughter, she appears more confused since arriving to visit. At one point, she started to become very emotional for unclear reason but daughter able to calm her down. After further discussion, patient restarted sertraline  last week for more recent changes in mood. Has been more angry and agitated over the past several weeks.  Previously on sertraline  by psychiatry but apparently they would no longer prescribe and was abruptly discontinued.  No other changes to medications, no recent illness. Reports sleeps well and good appetite although admits to no water  intake, reports to drink tea during the day.   Headaches overall well controlled, occasional headaches, will resolve with  Nurtec. Remains on amitriptyline  50 mg daily. No witnessed seizure activity, continues on Keppra  1500 mg twice daily and topiramate  100 mg twice daily.  Compliant on stroke prevention medications.  Routinely follows with PCP.  Update 08/23/2022 JM: Patient returns for routine follow-up accompanied by her daughter.  Seizure: At prior visit, reported seizure event likely unprovoked.  Reported compliance on Keppra  1500 mg twice daily.  She was started on topiramate  with gradual titration to 50 mg twice daily (which can also help with reported continued headaches).  She has not had any additional seizure activity and tolerated medications well.   Headache: still having headaches but improved since starting topamax , currently having about 2-3 per week, will take extra dose of aspirin  as needed for headache (in addition to daily 81mg  dosage).    Stroke: daughter reports episode last week where she was slurring her words while laying down in bed, lasted about 30 minutes. Was fatigued at that time. Was able to follow commands and answer questions but daughter believes she had slight confusion. Unsure if headache present.  No evidence of weakness or any other neurological deficits at that time. Daughter dose endorse increased stressors and unsure if this was related.  No other new stroke/[name] symptoms since that time. Continued aphasia stable.  Compliant on Eliquis , aspirin   and atorvastatin .  Blood pressure well-controlled.  Routinely follows with PCP and cardiology.    Update 04/05/2022 JM: Patient returns for 108-month follow-up accompanied by her daughter  Seizure: Currently on Keppra  1500 mg twice daily Daughter reports seizure last week, was sitting on the side of her bed folding laundry talking with her daughter and then suddenly stopped talking and leaned back into bed frame, eyes open, was not responding to daughter, after a couple of minutes daughter splashed water  on her face and patient came to.  She  was drowsy for short while after.  No additional events since that time.  Denies any missed dosages of Keppra , recent illness or infection or other specific provoking factors.   Headaches: Currently on amitriptyline  50 mg nightly Reports continued headaches, some improvement initially after increasing amitriptyline  dosage at last visit but more lately has been having more headaches.  Reports these occur almost daily. Will use Tylenol  couple times weekly for more severe headaches. Daughter mentions she has not been sleeping well recently. Currently working with PCP regarding this.  She also complains of daytime fatigue, recently started on vitamin D  supplement for vitamin D  deficiency.    Stroke: Stable. No new stroke/[name] symptoms. Remains on Eliquis , aspirin  and atorvastatin  Blood pressure well controlled   Update 11/30/2021 JM: Patient returns for follow-up visit accompanied by her daughter.  Previously seen 3 months ago with complaints of worsening headache as well as breakthrough seizure.   Keppra  dosage increased to 1500 mg twice daily, tolerating without side effects, denies any recurrent seizure activity.    Reports continued daily frontal headaches 2-3x per day, usually short lasting, takes tylenol  1000mg  of tyneol 2-3x daily which resolves headaches.  Remains on amitriptyline  25 mg nightly, denies side effects.  Stable from stroke standpoint without new stroke/[name] symptoms.  Residual speech deficit stable.  Compliant on Eliquis , and aspirin , denies side effects. Has had difficulty obtaining amlodipine  and atorvastatin  refill, has been out over the past 3 months, reports PCP will not refill as she did not start medications.  blood pressure today 123/77. Monitors at home and has been stable.   Was hospitalized 6/7 through 6/14 for 1 week nausea/vomiting and intermittent abdominal pain.  No clear source identified.  Did note dehydration with elevated creatinine and anemia, hypokalemia,  hypomagnesemia and hyponatremia. She has not had any additional n/v or abdominal pains.  No further concerns at this time   Update 08/28/2021 JM: Returns for acute visit due to complaints of worsening headaches.  At prior visit 2 months ago, she was started on amitriptyline  for headache complaints since her stroke in 05/2021. Has been taking amitriptyline  25mg  nightly - per daughter, thought they were improving as no mention of having headaches since starting amitriptyline  but at cardiology visit last week, was complaining of headache and not feeling right upon arriving to appt. She was able to ambulate back but walking slow, sat down and then stopped talking. Mouth was twisted towards the side, teeth clenched, and lost consciousness for 2-3 minutes, she did have postictal confusion but improved after a few minutes. She was not sent to ED as she recovered back to baseline within reasonable time. No additional seizures since that time. She has been complaining of a headache since her seizure but no additional headaches over the past 2 days. Denies any new/worsening speech/language difficulties, weakness, vision changes or mental status changes. Daughter does report starting on sertraline  50mg  daily by psychiatry 2 weeks ago for depression, no other medication changes.  No recent illness, trauma, falls, or other potential provoking factors. She does not drink alcohol or use recreational drugs.  Reports compliance with all medications.  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 fib after presenting with headache, worsening speech and confusion. MRA right ICA 50% stenosis and severe proximal right M2 stenosis, L M1 stent patent, no LVO.  On Eliquis  twice daily and Brilinta  PTA recommended continuation as well as adding aspirin  81 mg daily.  LDL 21 on atorvastatin .  A1c 5.5.  EEG generalized  slowing but no seizures - remained on keppra . Discharged 12/7. Returned on 12/12 after episode of vomiting and syncopal episode with MRI showing extension of recent R MCA stroke likely in setting of hypotension/syncope in setting of right ICA and M2 stenosis. Continued Eliquis  and asa -Brilinta  discontinued.  Concern of possible seizure on 12/14 with staring off and not responsive, confusion for a few minutes after therefore increased Keppra  to 1g BID. LDL 21 - decreased atorvastatin  to 40 mg daily. BP stabilized on IVF, orthostatics unremarkable.  Residual expressive aphasia with word salad. Therapy evals recommended OP therapies.    Today she is accompanied by her daughter, Courtney Burke. Having headaches since recent stroke. Will take tylenol  with some benefit but will return. Continues to work with SLP noting improvement, family working with her on reading out loud and conversations. Per daughter, patient will push herself too much at times and try to do too much at once. Family tries to ensure she takes breaks. Denies weakness, some imbalance but overall stable. Does c/o fear of additional strokes and difficulty sleeping at night due to mind wandering. No prior issues with depression/anxiety.   Reports compliance on Eliquis  5 mg twice daily and atorvastatin  40 mg daily without side effects. Has not been taking aspirin .  Blood pressure today 151/78 and on recheck, 132/82.  Compliant on Keppra  but over past week has been taking 500mg  BID due to difficulty refilling new dose (had 500mg  tabs left over from prior prescription). Was doing well on 1000mg  BID.   Of note, diagnosed with COVID-19 12/29 after presenting to ED with lightheadedness, dizziness, nausea and vomiting and possible loss of consciousness or near syncope.  Extensive work-up largely unrevealing except COVID-positive which was felt to be contributing to her symptoms. MR brain and MRA head/neck no acute findings or changes from prior recent imaging.    No further concerns at this time     PERTINENT IMAGING  Per hospitalization 05/15/2021 CT head - Slightly increased hypodensity in the right posterior temporal lobe and lateral occipital lobe, consistent with evolution of previously noted acute infarct. MRI  Extension of the acute infarction in the right MCA territory at the right temporoparietal junction, particularly along the inferior margin of the infarction and with a few tiny punctate satellite foci additionally present. MRA 12/7 - Approximate 50% stenosis at the supraclinoid right ICA, with probable additional short-segment severe proximal right M2 stenosis.  EEG cortical dysfunction arising from left and right temporal region, no seizure   Per hospitalization 05/10/2021 CT head Old left MCA territory strokes. Newly seen loss of gray-white differentiation at the right temporoparietal junction suggesting acute infarction in that region today. No hemorrhage or mass effect MRI  1. Acute ischemic nonhemorrhagic infarct involving the right temporoccipital junction, posterior right MCA distribution.2. Chronic left MCA territory infarcts, stable from prior.3. Additional small remote right cerebellar infarct.4. Underlying  age-related cerebral atrophy with mild chronic small vessel ischemic disease. MRA  head1. Negative intracranial MRA for large vessel occlusion.2. Approximate 50% stenosis at the supraclinoid right ICA, with probable additional short-segment severe proximal right M2 stenosis.3. Vascular stent in place within the left M1 segment. Flow through the stent itself not evaluated by MRA, although patent flow seen distal to the stent.4. Short-segment severe left M2 stenosis, similar to previous.5. Wide patency of the posterior circulation MRA Neck Wide patency of both carotid artery systems and vertebral arteries within the neck. No hemodynamically significant stenosis or other acute vascular abnormality. 2D Echo EF 55 to 60% EEG  generalized slowing.  No seizures identified      Update 02/13/2021 JM: [name] returns for acute visit in setting of recent seizure activity.  She is accompanied by her brother.  Seizure type event 02/07/2021 evaluated in ED with symptoms of headache and then tonic-clonic jerking lasting 5 to 6 minutes with residual confusion and bowel incontinence.  Initiated Keppra  500 mg twice daily. CT head unremarkable.  No additional events since discharge.  Remains on Keppra  500 mg twice daily tolerating without side effects.  Reports slight worsening of speech since seizure event.  Was working with SLP and PT prior to seizure but has not yet restarted.  Continues to use a cane and denies any recent falls.  Denies new stroke/[name] symptoms.  Remains on Eliquis  and atorvastatin .  Also compliant on Brilinta  per IR recommendations.  No further concerns at this time.  Update 01/04/2021 JM: [name] returns for 44-month stroke follow-up accompanied by her brother, Courtney Burke.  Working with SLP for residual aphasia but notes ongoing improvement. Gait has also been improving working with PT - uses cane - did have a fall last month but thankfully without injury.  Denies new stroke/[name] symptoms. ILR showed evidence of atrial fibrillation on 10/05/2020 -Eliquis  initiated in addition to Brilinta . ASA d/c'd.  She does have mild bruising but otherwise tolerating.  Has not yet scheduled diagnostic angiogram with Dr. Dolphus.  Compliant on atorvastatin  without associated side effects.  Blood pressure 136/82. Monitors at home and typically stable 120s/70s.  No further concerns at this time.  Initial visit 10/05/2020 Dr. Rosemarie: [name] is a 68 year old African-American lady seen today for initial office follow-up visit following hospital admission for stroke in January 2022.  She is accompanied by her daughter.  History is obtained from them and review of hospital electronic medical records and I personally reviewed imaging films in PACS.   She is a 68 year old pleasant African-American lady with past medical history of diabetes, hypertension, chronic kidney disease stage IIIa, dysphagia, obesity, s/p gastric sleeve surgery in December 2021.  She was brought in as a code stroke by EMS after she developed altered mental status.  She had a witnessed episodes in which her eyes rolled back in her head twice with brief loss of consciousness.  She is subsequently found to have right-sided weakness.  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  Aspect score was 6 on admission.  Infarct core was 11 to 20 cc only.  After discussion of risk benefits with the patient's family patient was taken for emergent mechanical thrombectomy which was performed by Dr. Dolphus  but there was underlying MCA stenosis requiring rescue MCA stenting with resultant TICI 2C revascularization.  She was admitted admitted to the intensive care unit blood pressure tightly controlled.  She was subsequently extubated and found to have global aphasia and right hemiparesis.  MRI scan showed acute infarct involving left MCA territory of basal ganglia as well as left patchy temporal frontal and parietal lobes.  There is small area of hemorrhage noted in the left posterior temporal lobe.  2D echo showed ejection fraction of 6065%.  Lower extremity venous Dopplers negative for DVT.  LDL cholesterol 68 mg percent.  Hemoglobin A1c was 5.4.  Patient was started on aspirin  and Brilinta  for MCA stent.  History of dysphagia and had a feeding tube inserted.  She was transferred to inpatient rehab where she stayed for few weeks.  She also started on Keppra  for seizure prophylaxis due to her episodes of seizure-like presentation.  EEG she had shown  intermittent rhythmic delta slowing in the left frontal region.  Patient has been at home since rehab.  She is to get getting physical occupational and speech therapy which is now down to 1 day a week.  She is able to speak better but still has to speak slowly and occasionally gets stuck and then some word finding difficulties.  She has noticed improvement in right-sided strength and now she can walk independently but needs a walker.  She still has some dragging of the right foot.  Her balance is poor.  However she has had no falls or injuries.  She is able to manage most activities of daily living but needs a little supervision.  She is tolerating aspirin  and Brilinta  well with only minor bruising and no bleeding.  Blood pressures well controlled today it is elevated in the office at 153/75.  She remains on Lipitor  which is tolerating well without muscle aches and pains.    ROS:   14 system review of systems is positive for those listed in HPI and all other systems negative  PMH:  Past Medical History:  Diagnosis Date   Acute ischemic left MCA stroke (HCC) 06/30/2020   Acute ischemic stroke (HCC)    Acute stroke due to occlusion of left middle cerebral artery (HCC) 06/30/2020   Age-related nuclear cataract of both eyes 09/28/2015   Arthralgia of multiple joints 09/28/2015   Asthma    Borderline diabetes    Cerebrovascular accident (CVA) (HCC)    Chronic bilateral thoracic back pain 06/05/2018   Chronic renal insufficiency, stage 1 08/11/2014   Diabetes mellitus type 2 in obese    Dyslipidemia    Dysphagia, post-stroke    Erosive gastritis 07/20/2019   Formatting of this note might be different from the original. On EGD 07/2019   Esophagitis 07/20/2019   Formatting of this note might be different from the original. Added automatically from request for surgery [id number]  Formatting of this note might be different from the original. On EGD 07/2019   Essential hypertension 09/28/2015   Essential  thrombocytosis (HCC) 09/28/2015   Gallstones 08/18/2019   Formatting of this note might be different from the original. 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   Morbid obesity (HCC) 05/21/2013   Seizures (HCC)  Stage 3b chronic kidney disease (HCC)    Status post gastric bypass for obesity 09/28/2015    Social History:  Social History   Socioeconomic History   Marital status: Divorced    Spouse name: Not on file   Number of children: Not on file   Years of education: Not on file   Highest education level: Bachelor's degree (e.g., BA, AB, BS)  Occupational History   Not on file  Tobacco Use   Smoking status: Never   Smokeless tobacco: Never  Vaping Use   Vaping status: Never Used  Substance and Sexual Activity   Alcohol use: No   Drug use: No   Sexual activity: Not on file  Other Topics Concern   Not on file  Social History Narrative   Lives with daughter Courtney Burke   Right Handed   Drinks no caffeine   Retired Engineer, 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 : Never true  Recent Concern: Food Insecurity - Food Insecurity Present (07/12/2023)   Hunger Vital  Sign    Worried About Running Out of Food in the Last Year: Sometimes true    Ran Out of Food in the Last Year: Sometimes true  Transportation Needs: No Transportation Needs (09/17/2023)   Received from [facility]    In the past 12 months, has lack of reliable transportation kept you from medical appointments, meetings, work or from getting things needed for daily living? : No  Physical Activity: Insufficiently Active (07/12/2023)   Exercise Vital Sign    Days of Exercise per Week: 1 day    Minutes of Exercise per Session: 10 min  Stress: No Stress Concern Present (06/29/2022)   [employer] - Occupational Stress Questionnaire    Feeling of Stress : Not at all  Social Connections: Unknown (07/12/2023)   Social Connection and Isolation Panel    Frequency of Communication with Friends and Family: Three times a week    Frequency of Social Gatherings with Friends and Family: Never    Attends Religious Services: Patient declined    Active Member of Clubs or Organizations: No    Attends Engineer, structural: Not on file    Marital Status: Patient declined  Intimate Partner Violence: Not At Risk (06/29/2022)   Humiliation, Afraid, Rape, and Kick questionnaire    Fear of Current or Ex-Partner: No    Emotionally Abused: No    Physically Abused: No    Sexually Abused: No    Medications:   Current Outpatient Medications on File Prior to Visit  Medication Sig Dispense Refill   acetaminophen  (TYLENOL ) 500 MG tablet Take 1,000 mg by mouth every 6 (six) hours as needed for moderate pain or headache.     amitriptyline  (ELAVIL ) 50 MG tablet Take 1 tablet (50 mg total) by mouth at bedtime. 90 tablet 3   amLODipine  (  NORVASC ) 10 MG tablet Take 1 tablet (10 mg total) by mouth daily. 90 tablet 3   aspirin  EC 81 MG tablet Take 1 tablet (81 mg total) by mouth daily. Swallow whole.     atorvastatin  (LIPITOR ) 80 MG tablet Take 1 tablet (80 mg total) by  mouth daily. 90 tablet 3   cholecalciferol  (VITAMIN D3) 25 MCG (1000 UNIT) tablet Take by mouth.     DULoxetine  (CYMBALTA ) 30 MG capsule TAKE 1 CAPSULE BY MOUTH EVERY DAY 90 capsule 4   ELIQUIS  5 MG TABS tablet TAKE 1 TABLET BY MOUTH TWICE A DAY 60 tablet 5   KLOR-CON  M20 20 MEQ tablet Take 40 mEq by mouth 2 (two) times daily.     levETIRAcetam  (KEPPRA ) 750 MG tablet Take 1 tablet (750 mg total) by mouth 2 (two) times daily. 180 tablet 3   Multiple Vitamin (MULTIVITAMIN WITH MINERALS) TABS tablet Take 1 tablet by mouth daily.     NURTEC 75 MG TBDP TAKE 1 TABLET (75 MG TOTAL) BY MOUTH AS NEEDED. 16 tablet 5   omeprazole (PRILOSEC) 40 MG capsule Take 1 tablet by mouth daily.     pantoprazole  (PROTONIX ) 40 MG tablet Take 40 mg by mouth daily.     potassium chloride  SA (KLOR-CON  M) 20 MEQ tablet Take 2 tabs (40mEq) daily for 4 days.  Then take 1 tab (20 mEq) daily. 90 tablet 1   topiramate  (TOPAMAX ) 100 MG tablet Take 1 tablet (100 mg total) by mouth 2 (two) times daily. Take 1 tablet twice daily 180 tablet 1   ursodiol  (ACTIGALL ) 250 MG tablet Take 250 mg by mouth 2 (two) times daily.     Vitamin D , Ergocalciferol , (DRISDOL ) 1.25 MG (50000 UNIT) CAPS capsule Take 1 capsule (50,000 Units total) by mouth every 7 (seven) days. 12 capsule 0   sertraline  (ZOLOFT ) 25 MG tablet TAKE 1 TABLET (25 MG TOTAL) BY MOUTH DAILY. (Patient not taking: Reported on 02/13/2024) 90 tablet 1   No current facility-administered medications on file prior to visit.    Allergies:   Allergies  Allergen Reactions   Homatropine Itching   Iodinated Contrast Media Itching and Other (See Comments)   Doxycycline Nausea And Vomiting and Other (See Comments)   Sulfa Antibiotics Rash and Hives   Codeine Hives and Swelling    Swollen tongue   Hydrocodone Itching   Hydrocodone Bit-Homatrop Mbr Itching   Sulfamethoxazole Hives   Ace Inhibitors Cough, Itching and Rash    Physical Exam Today's Vitals   02/13/24 1113  BP:  120/74  Pulse: 67  Weight: 164 lb (74.4 kg)  Height: 5' 2 (1.575 m)    Body mass index is 30 kg/m.   General: well developed, well nourished very pleasant middle-aged African-American lady, seated, moderately anxious  Neurologic Exam Mental Status: Awake and fully alert.  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  No evidence of dysarthria.  Able to follow simple step commands without difficulty. Difficulty answering a couple questions in a row, had difficulty transitioning from 1 question to another.  Recent memory impaired and remote memory intact. Attention span, concentration and fund of knowledge impaired during visit. Mood and affect flat compared to normal Cranial Nerves: Pupils equal, briskly reactive to light. Extraocular movements full without nystagmus. Visual fields full to confrontation. Hearing intact. Facial sensation intact.  Face, tongue, palate moves normally and symmetrically.  Motor: Normal bulk and tone. Normal strength in all tested extremity muscles.  Sensory.: intact to touch ,pinprick .position and  vibratory sensation.  Coordination: Rapid alternating movements normal in all extremities. Finger-to-nose and heel-to-shin performed accurately bilaterally. Gait and Station: Arises from chair without difficulty. Stance is normal. Gait demonstrates normal stride length with mild imbalance initially but stabilized without assistive device.   Reflexes: 1+ and symmetric. Toes downgoing.       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 ILR showed A fib 10/2020 - placed on Eliquis . Additional seizure 02/2021, restarted Keppra . 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 Was unable to obtain EEG acutely at that time. Possible seizure at home 8/31 and 6/23. Unclear if breakthrough seizure in setting of hypokalemia (most recent level 2.6). will repeat CMP today. Will also check keppra  level Recommend completion of 72 hr EEG to further characterize events Unable to further increase keppra  and topiramate  dosages due to kidney function (CrCl 30) Once potassium level stabilizes, will consider adding additional agent such as lamotrigine if seizures persist  Start Valtoco  as needed for seizure rescue - education provided to daughter EEG 03/2023 mild background slowing but no evidence of seizures  Vascular headaches No recent reported headache - improved after starting duloxetine  Continue duloxetine  30 mg daily Continue amitriptyline  50 mg nightly Continue topiramate  100 mg twice daily Continue Nurtec as needed  Hx of R MCA and L MCA stroke Eliquis  5 mg twice daily, asa 81mg  daily and atorvastatin  80 mg daily for secondary stroke prevention managed/monitored by PCP/cardiology Continue routine follow-up with cardiology and PCP for stroke risk factor management   Cognitive impairment Suspect cognitive impairment now progressed to dementia but unable to fully evaluate due to aphasia. Would recommend participation in adult day programs if patient willing. Suspect caregiver burnout in daughter, discussed participation in support groups which can be greatly beneficial.  Can consider use of Aricept or Namenda in the future but would hold off currently while trying to get seizures under control       Follow-up in 6 months or call earlier if needed    CC:  [name], MD   I personally spent a total of 50 minutes in the care of the patient today including preparing to see the patient, getting/reviewing separately obtained history, performing a medically appropriate  exam/evaluation, counseling and educating, placing orders, and documenting clinical information in the EHR.   Harlene Bogaert, AGNP-BC  [facility] [address] [place], [place] [address]  Phone [phone] Fax [phone] Note: This document was prepared with digital dictation and possible smart phrase technology. Any transcriptional errors that result from this process are unintentional.

## 2024-02-13 NOTE — Patient Instructions (Addendum)
 Your Plan:  Continue keppra  750mg  twice daily and topamax  100mg  twice daily for seizure prevention  Will start Valtoco  as needed for seizure rescue  Order placed to complete a 3 day EEG monitoring at home - you will be called to schedule   Please call if seizures persist, may have to consider adding additional seizure prevention medication  We will recheck lab work today, possible break through seizures due to low potassium level. If this gets corrected and seizures persist, would recommend adding additional agent  Continue Cymbalta  and amitriptyline  for headache prevention       Follow up in 6-8 months or call earlier if needed      Thank you for coming to see us  at [facility]. I hope we have been able to provide you high quality care today.  You may receive a patient satisfaction survey over the next few weeks. We would appreciate your feedback and comments so that we may continue to improve ourselves and the health of our patients.    Dementia Caregiver Guide Dementia is a condition that affects the way the brain works. It often affects thinking and memory. A person with dementia may: Forget things. Have trouble talking or responding to your questions. Have trouble paying attention. Have trouble thinking clearly and making good decisions. Get lost or wander away from home or other places. Have big changes in their mood or emotions. They may: Feel very worried, nervous, or depressed. Have angry outbursts. Be suspicious or accuse you of things. Have childlike behavior and language. Taking care of someone with dementia can be a challenge. The tips below can help you care for the person. How to help manage lifestyle changes Dementia usually gets worse slowly over time. In the early stages, people with dementia can stay safe and take care of themselves with some help. In later stages, they need help with daily tasks like getting dressed, grooming, and  going to the bathroom. Communicating When the person is talking and seems frustrated, make eye contact and hold the person's hand. Ask questions that can be answered with a yes or no. Use simple words and a calm voice. Only give one direction at a time. Limit choices for the person. Too many choices can be stressful. Avoid correcting the person in a negative way. If the person can't find the right words, gently try to help. Preventing injury  Keep floors clear. Remove rugs, magazine racks, and floor lamps. Keep hallways well lit, especially at night. Put a handrail and nonslip mat in the bathtub or shower. Put childproof locks on cabinets that have dangerous items in them. These items include medicine, alcohol, guns, cleaning products, and sharp tools. For doors to the outside, put locks where the person can't see or reach them. This helps keep the person from going out of the house and getting lost. Be ready for emergencies. Keep a list of emergency phone numbers and addresses close by. Remove car keys and lock garage doors so the person doesn't try to drive. Have the person wear a bracelet that tracks where they are and shows that they're a person with memory loss. This should be worn at all times for safety. Helping with daily life  Keep the person on track with their daily routine. Try to identify areas where the person may need help. Be supportive, patient, calm, and encouraging. Gently remind the person that adjusting to changes takes time. Help with the tasks that the person has asked for help  with. Keep the person involved in daily tasks and decisions as much as you can. Encourage conversation, but try not to get frustrated if the person struggles to find words or doesn't seem to appreciate your help. Other tips Think about any safety risks and take steps to avoid them. Keep things organized: Organize medicines in a pill box for each day of the week. Keep a calendar in a  central place. Use it to remind the person of health care visits or other activities. Create a plan to handle any legal or financial matters. Get help from a professional if needed. Help make sure the person: Takes medicines only as told by their health care providers. Eats regular, healthy meals. They should also drink plenty of fluids. Goes to all scheduled health care appointments. Gets regular sleep. Taking care of yourself Being a caregiver for someone with dementia can be hard. You may feel stressed and have many other emotions. It's important to also take care of yourself. Here are some tips: Find out about services that can provide short-term care for the person. This is called respite care. It can allow you to take a break when you need one. Find healthy ways to deal with stress. Some ways include: Spending time with other people. Exercising. Meditating or doing deep breathing exercises. Take care of your own health by: Getting enough sleep. Eating healthy foods. Getting regular exercise. Join a support group with others who are caregivers. These groups can help you: Learn other ways to deal with stress. Share experiences with others. Get emotional comfort and support. Learn about caregiving as the disease gets worse. Find resources in your community. Where to find support: Many people and organizations offer support. These include: Support groups for people with dementia. Support groups for caregivers. Counselors or therapists. Home health care services. Adult day care centers. Where to find more information Alzheimer's Association: [employer] Family Caregiver Alliance: caregiver.org Alzheimer's Foundation of [place]: alzfdn.org Contact a health care provider if: The person's health is quickly getting worse. You're no longer able to care for the person. Caring for the person is affecting your physical and emotional health. You're feeling worried, nervous, or depressed  about caring for the person. Get help right away if: You feel like the person may hurt themselves or others. The person has talked about taking their own life. These symptoms may be an emergency. Take one of these steps right away: Go to your nearest emergency room. Call 911. Call the National Suicide Prevention Lifeline at 248 557 0703 or 988. Text the Crisis Text Line at (404) 463-2462. This information is not intended to replace advice given to you by your health care provider. Make sure you discuss any questions you have with your health care provider. Document Revised: 08/31/2022 Document Reviewed: 08/31/2022 Elsevier Patient Education  2024 [employer].

## 2024-02-14 ENCOUNTER — Encounter: Payer: Self-pay | Admitting: Vascular Surgery

## 2024-02-14 ENCOUNTER — Ambulatory Visit (HOSPITAL_COMMUNITY)
Admission: RE | Admit: 2024-02-14 | Discharge: 2024-02-14 | Disposition: A | Discharge status: Home / Self Care | Source: Ambulatory Visit | Attending: Vascular Surgery | Admitting: Vascular Surgery

## 2024-02-14 ENCOUNTER — Ambulatory Visit (INDEPENDENT_AMBULATORY_CARE_PROVIDER_SITE_OTHER): Admitting: Vascular Surgery

## 2024-02-14 ENCOUNTER — Encounter: Payer: Self-pay | Admitting: Family Medicine

## 2024-02-14 ENCOUNTER — Ambulatory Visit: Admitting: Family Medicine

## 2024-02-14 ENCOUNTER — Ambulatory Visit: Payer: Self-pay | Admitting: Adult Health

## 2024-02-14 VITALS — BP 100/60 | HR 82 | Temp 98.4°F | Ht 62.0 in | Wt 165.4 lb

## 2024-02-14 VITALS — BP 104/70 | HR 73 | Temp 98.2°F | Ht 62.0 in | Wt 164.0 lb

## 2024-02-14 DIAGNOSIS — R0989 Other specified symptoms and signs involving the circulatory and respiratory systems: Secondary | ICD-10-CM | POA: Diagnosis present

## 2024-02-14 DIAGNOSIS — I10 Essential (primary) hypertension: Secondary | ICD-10-CM

## 2024-02-14 DIAGNOSIS — G40909 Epilepsy, unspecified, not intractable, without status epilepticus: Secondary | ICD-10-CM | POA: Diagnosis not present

## 2024-02-14 DIAGNOSIS — M1990 Unspecified osteoarthritis, unspecified site: Secondary | ICD-10-CM | POA: Diagnosis present

## 2024-02-14 DIAGNOSIS — Z23 Encounter for immunization: Secondary | ICD-10-CM

## 2024-02-14 DIAGNOSIS — E1169 Type 2 diabetes mellitus with other specified complication: Secondary | ICD-10-CM

## 2024-02-14 DIAGNOSIS — M79672 Pain in left foot: Secondary | ICD-10-CM

## 2024-02-14 DIAGNOSIS — N184 Chronic kidney disease, stage 4 (severe): Secondary | ICD-10-CM | POA: Diagnosis not present

## 2024-02-14 DIAGNOSIS — I69320 Aphasia following cerebral infarction: Secondary | ICD-10-CM

## 2024-02-14 LAB — COMPREHENSIVE METABOLIC PANEL WITH GFR
ALT: 8 IU/L (ref 0–32)
AST: 14 IU/L (ref 0–40)
Albumin: 4 g/dL (ref 3.9–4.9)
Alkaline Phosphatase: 215 IU/L — ABNORMAL HIGH (ref 44–121)
BUN/Creatinine Ratio: 7 — ABNORMAL LOW (ref 12–28)
BUN: 14 mg/dL (ref 8–27)
Bilirubin Total: 0.4 mg/dL (ref 0.0–1.2)
CO2: 17 mmol/L — ABNORMAL LOW (ref 20–29)
Calcium: 9.2 mg/dL (ref 8.7–10.3)
Chloride: 113 mmol/L — ABNORMAL HIGH (ref 96–106)
Creatinine, Ser: 1.98 mg/dL — ABNORMAL HIGH (ref 0.57–1.00)
Globulin, Total: 2.4 g/dL (ref 1.5–4.5)
Glucose: 83 mg/dL (ref 70–99)
Potassium: 5.2 mmol/L (ref 3.5–5.2)
Sodium: 142 mmol/L (ref 134–144)
Total Protein: 6.4 g/dL (ref 6.0–8.5)
eGFR: 27 mL/min/1.73 — ABNORMAL LOW (ref >59)

## 2024-02-14 LAB — POCT GLYCOSYLATED HEMOGLOBIN (HGB A1C): Hemoglobin A1C: 4.8 % (ref 4.0–5.6)

## 2024-02-14 LAB — LEVETIRACETAM LEVEL: Levetiracetam Lvl: 36.6 ug/mL (ref 10.0–40.0)

## 2024-02-14 MED ORDER — AMLODIPINE BESYLATE 5 MG PO TABS: 5.0000 mg | ORAL_TABLET | Freq: Every day | ORAL | 3 refills | Status: DC

## 2024-02-14 NOTE — Patient Instructions (Signed)
 Monitor BP at home.  For continued low readings notify clinic or for elevations in readings.

## 2024-02-14 NOTE — Progress Notes (Signed)
 Established Patient Office Visit   Subjective  Patient ID: Courtney Burke, female    DOB: 1956/03/01  Age: 68 y.o. MRN: [id number]  Chief Complaint  Patient presents with   Medical Management of Chronic Issues    Patient came in today for 6 month follow-up     Pt is a 68 yo female seen for f/u on chronic conditions.  Pt had f/u with Neurology for seizures.  Had one yesterday characterized by staring episodes and confusion upon regaining awareness. During these episodes, she appears confused and does not recall the events. Another seizure last Sunday involved a blank stare and regurgitation without emesis. Her daughter managed the situation by placing her on her side. Neurology is attempting to prescribe a nasal spray to manage these episodes, but she is awaiting insurance approval.  Pt with significant L foot pain, particularly when her shoe rubs against the affected area. The pain is severe, with a sensation of swelling and a 'hole' that causes discomfort. It is localized to the top of her foot and does not affect the bottom. Seen by vascular, who felt circulation was normal.  The pain is exacerbated by shoe pressure and has been persistent, causing distress.  Her recent blood pressure readings have included 100/60 mmHg and 101/96 mmHg. She is currently taking amlodipine  10 mg. She feels dizzy and unwell.  Her daughter mentions that she has been experiencing puffiness under her eyes, which subsides as she calms down. Pt often taking multiple pills at once with minimal water , which sometimes causes nausea.    Patient Active Problem List   Diagnosis Date Noted   Trigger middle finger of left hand 03/28/2022   Aphasia following cerebrovascular accident (CVA) 03/28/2022   Hypokalemia 11/12/2021   Hypomagnesemia 11/12/2021   Normocytic anemia 11/10/2021   Hyponatremia 11/09/2021   Metabolic acidosis 11/09/2021   Intractable nausea and vomiting 11/08/2021   Paroxysmal atrial  fibrillation (HCC) 11/08/2021   History of CVA (cerebrovascular accident) 07/24/2021   Syncope 05/16/2021   Breast lump 05/16/2021   Seizure disorder (HCC) 05/16/2021   CVA (cerebral vascular accident) (HCC) 05/11/2021   Acute cerebral infarction (HCC) 05/10/2021   Dyslipidemia 05/09/2021   Type 2 diabetes mellitus with obesity (HCC) 05/09/2021   Cerebral infarction due to unspecified occlusion or stenosis of left middle cerebral artery (HCC) 06/30/2020   Gallstones 08/18/2019   Esophagitis 07/20/2019   Erosive gastritis 07/20/2019   Nausea vomiting and diarrhea 07/16/2019   AKI (acute kidney injury) (HCC) 07/15/2019   Chronic bilateral thoracic back pain 06/05/2018   Large breasts 06/05/2018   Seasonal allergic rhinitis due to pollen 05/09/2017   Microcalcifications of the breast 07/02/2016   Mild intermittent asthma without complication 05/08/2016   Severe single current episode of major depressive disorder, without psychotic features (HCC) 05/08/2016   Vitamin D  deficiency 05/08/2016   Encounter for health maintenance examination 05/08/2016   Persistent headaches 02/16/2016   Arthralgia of multiple joints 09/28/2015   Age-related nuclear cataract of both eyes 09/28/2015   Asthma 09/28/2015   Essential hypertension 09/28/2015   Essential thrombocytosis (HCC) 09/28/2015   Gastroesophageal reflux disease without esophagitis 09/28/2015   Keratoconjunctivitis sicca of both eyes not specified as Sjogren's 09/28/2015   Status post gastric bypass for obesity 09/28/2015   Stage 3b chronic kidney disease (HCC) 09/28/2015   Esophageal dysphagia 09/28/2015   Hot flashes 09/28/2015   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   IFG (impaired fasting glucose) 09/28/2015   Menopausal disorder 09/28/2015  Morbid obesity with BMI of 40.0-44.9, adult (HCC) 09/28/2015   Non-smoker 09/28/2015   Pelvic pain in female 09/28/2015   Presbyopia of both eyes 09/28/2015   Snoring 09/28/2015   Tinea  corporis 09/28/2015   Osteopenia 07/15/2015   Benign hypertension with CKD (chronic kidney disease) stage III (HCC) 08/11/2014   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   Hyperlipidemia 03/16/2013   Past Medical History:  Diagnosis Date   Acute ischemic left MCA stroke (HCC) 06/30/2020   Acute ischemic stroke (HCC)    Acute stroke due to occlusion of left middle cerebral artery (HCC) 06/30/2020   Age-related nuclear cataract of both eyes 09/28/2015   Arthralgia of multiple joints 09/28/2015   Asthma    Borderline diabetes    Cerebrovascular accident (CVA) (HCC)    Chronic bilateral thoracic back pain 06/05/2018   Chronic renal insufficiency, stage 1 08/11/2014   Diabetes mellitus type 2 in obese    Dyslipidemia    Dysphagia, post-stroke    Erosive gastritis 07/20/2019   Formatting of this note might be different from the original. On EGD 07/2019   Esophagitis 07/20/2019   Formatting of this note might be different from the original. Added automatically from request for surgery [id number]  Formatting of this note might be different from the original. On EGD 07/2019   Essential hypertension 09/28/2015   Essential thrombocytosis (HCC) 09/28/2015   Gallstones 08/18/2019   Formatting of this note might be different from the original. 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   Morbid obesity (HCC) 05/21/2013   Seizures (HCC)    Stage 3b chronic kidney disease (HCC)    Status post gastric bypass for obesity 09/28/2015   Past Surgical History:  Procedure Laterality Date   ANKLE SURGERY     CARPAL TUNNEL RELEASE     carpel tunnel     ESOPHAGOGASTRODUODENOSCOPY (EGD) WITH PROPOFOL   N/A 11/14/2021   Procedure: ESOPHAGOGASTRODUODENOSCOPY (EGD) WITH PROPOFOL ;  Surgeon: Avram Lupita BRAVO, MD;  Location: [facility] ENDOSCOPY;  Service: Gastroenterology;  Laterality: N/A;   IR ANGIO INTRA EXTRACRAN SEL COM CAROTID INNOMINATE BILAT MOD SED  01/17/2021   IR ANGIO VERTEBRAL SEL SUBCLAVIAN INNOMINATE UNI R MOD SED  01/17/2021   IR CT HEAD LTD  06/30/2020   IR INTRA CRAN STENT  06/30/2020   IR PERCUTANEOUS ART THROMBECTOMY/INFUSION INTRACRANIAL INC DIAG ANGIO  06/30/2020   IR RADIOLOGIST EVAL & MGMT  08/26/2020   IR US  GUIDE VASC ACCESS RIGHT  01/17/2021   RADIOLOGY WITH ANESTHESIA N/A 06/30/2020   Procedure: RADIOLOGY WITH ANESTHESIA;  Surgeon: Radiologist, Medication, MD;  Location: [facility] OR;  Service: Radiology;  Laterality: N/A;   Social History   Tobacco Use   Smoking status: Never   Smokeless tobacco: Never  Vaping Use   Vaping status: Never Used  Substance Use Topics   Alcohol use: No   Drug use: No   Family History  Problem Relation Age of Onset   Stroke Maternal Grandfather    Allergies  Allergen Reactions   Homatropine Itching   Iodinated Contrast Media Itching and Other (See Comments)   Doxycycline Nausea And Vomiting and Other (See Comments)   Sulfa Antibiotics Rash and Hives   Codeine Hives and Swelling  Swollen tongue   Hydrocodone Itching   Hydrocodone Bit-Homatrop Mbr Itching   Sulfamethoxazole Hives   Ace Inhibitors Cough, Itching and Rash    ROS Negative unless stated above    Objective:     BP 100/60 (BP Location: Left Arm, Patient Position: Sitting, Cuff Size: Large)   Pulse 82   Temp 98.4 F (36.9 C) (Oral)   Ht 5' 2 (1.575 m)   Wt 165 lb 6.4 oz (75 kg)   LMP 09/28/2012   SpO2 99%   BMI 30.25 kg/m  BP Readings from Last 3 Encounters:  02/14/24 100/60  02/14/24 104/70  02/13/24 101/68   Wt Readings from Last 3 Encounters:  02/14/24 165 lb 6.4 oz (75 kg)  02/14/24 164 lb (74.4 kg)  02/13/24 164 lb (74.4 kg)      Physical  Exam Constitutional:      General: She is not in acute distress.    Appearance: Normal appearance.  HENT:     Head: Normocephalic and atraumatic.     Nose: Nose normal.     Mouth/Throat:     Mouth: Mucous membranes are moist.  Cardiovascular:     Rate and Rhythm: Normal rate and regular rhythm.     Heart sounds: Normal heart sounds. No murmur heard.    No gallop.  Pulmonary:     Effort: Pulmonary effort is normal. No respiratory distress.     Breath sounds: Normal breath sounds. No wheezing, rhonchi or rales.  Skin:    General: Skin is warm and dry.     Comments: Discoloration of dorsum of L foot.  Neurological:     Mental Status: She is alert and oriented to person, place, and time.        02/13/2023    4:01 PM 06/29/2022    2:47 PM 03/28/2022   10:17 AM  Depression screen PHQ 2/9  Decreased Interest 0 0 1  Down, Depressed, Hopeless 2 0 1  PHQ - 2 Score 2 0 2  Altered sleeping 2  1  Tired, decreased energy 1  1  Change in appetite 0  0  Feeling bad or failure about yourself  1  0  Trouble concentrating 0  1  Moving slowly or fidgety/restless 1  1  Suicidal thoughts 0  0  PHQ-9 Score 7  6  Difficult doing work/chores Somewhat difficult  Not difficult at all      02/13/2023    4:02 PM  GAD 7 : Generalized Anxiety Score  Nervous, Anxious, on Edge 2  Control/stop worrying 1  Worry too much - different things 0  Trouble relaxing 0  Restless 1  Easily annoyed or irritable 1  Afraid - awful might happen 1  Total GAD 7 Score 6  Anxiety Difficulty Somewhat difficult     Results for orders placed or performed in visit on 02/14/24  POC HgB A1c  Result Value Ref Range   Hemoglobin A1C 4.8 4.0 - 5.6 %   HbA1c POC (<> result, manual entry)     HbA1c, POC (prediabetic range)     HbA1c, POC (controlled diabetic range)    Results for orders placed or performed during the hospital encounter of 02/14/24  VAS US  ABI WITH/WO TBI  Result Value Ref Range   Right ABI 1.15     Left ABI 1.24       Assessment & Plan:   Essential hypertension -     amLODIPine  Besylate; Take 1 tablet (5 mg total)  by mouth daily. (Patient not taking: Reported on 02/25/2024)  Dispense: 90 tablet; Refill: 3  Type 2 diabetes mellitus with other specified complication, without long-term current use of insulin  (HCC) -     POCT glycosylated hemoglobin (Hb A1C)  Stage 4 chronic kidney disease (HCC)  Seizure disorder (HCC)  Aphasia following cerebrovascular accident (CVA)  Need for influenza vaccination -     Flu vaccine HIGH DOSE PF(Fluzone Trivalent)  Left foot pain   BP well controlled.  Discussed decreasing dose of Norvasc  from 10 mg to 5 mg given concerns for hypotension.  Monitor BP at home.  Notify clinic for consistently low or elevated readings.  DM well-controlled.  A1c this visit 4.8%.  Continue lifestyle modifications.  Continue follow-up with neurology regarding seizure disorder.  Continue current medications.  Continue to maximize medical management for stroke prevention including BP and cholesterol control.  Continue Lipitor  80 mg daily.  Discussed possible causes of foot pain.  Discussed supportive care.  Consider wearing shoes that do not rub/press against dorsum of left foot and have appropriate arch support.  Return in about 4 months (around 06/15/2024).  Sooner if needed.  Clotilda JONELLE Single, MD

## 2024-02-15 LAB — VAS US ABI WITH/WO TBI
Left ABI: 1.24
Right ABI: 1.15

## 2024-02-18 ENCOUNTER — Encounter: Payer: Self-pay | Admitting: Family Medicine

## 2024-02-18 ENCOUNTER — Ambulatory Visit (INDEPENDENT_AMBULATORY_CARE_PROVIDER_SITE_OTHER): Admitting: Family Medicine

## 2024-02-18 DIAGNOSIS — Z1231 Encounter for screening mammogram for malignant neoplasm of breast: Secondary | ICD-10-CM

## 2024-02-18 DIAGNOSIS — Z0000 Encounter for general adult medical examination without abnormal findings: Secondary | ICD-10-CM

## 2024-02-18 NOTE — Patient Instructions (Signed)
 I really enjoyed getting to talk with you today! I am available on Tuesdays and Thursdays for virtual visits if you have any questions or concerns, or if I can be of any further assistance.   CHECKLIST FROM ANNUAL WELLNESS VISIT:  -Follow up (please call to schedule if not scheduled after visit):   -yearly for annual wellness visit with primary care office  Here is a list of your preventive care/health maintenance measures and the plan for each if any are due:  PLAN For any measures below that may be due:    1. Please obtain report for last colon cancer screening test (cologuard, stool cards or cologuard test) and provide to our office so that we can ensure colon cancer testing is up to date.   2. Please obtain last bone density test report and immunization record and provide to our office so that we can update these.   3. We ordered the mammogram. You can schedule by calling [phone].   4. Please schedule eye exam and send report to Dr. Mercer.  Health Maintenance  Topic Date Due   OPHTHALMOLOGY EXAM  Never done   COVID-19 Vaccine (4 - 2025-26 season) 02/03/2024   Zoster Vaccines- Shingrix (1 of 2) 04/02/2024 (Originally 07/29/1974)   DTaP/Tdap/Td (6 - Td or Tdap) 12/31/2024 (Originally 06/27/2023)   Pneumococcal Vaccine: 50+ Years (2 of 2 - PCV) 12/31/2024 (Originally 10/02/2003)   Mammogram  12/31/2024 (Originally 07/06/2023)   DEXA SCAN  12/31/2024 (Originally 07/30/2019)   Colonoscopy  12/31/2024 (Originally 07/30/1999)   Hepatitis C Screening  12/31/2024 (Originally 07/29/1972)   FOOT EXAM  05/14/2024   Diabetic kidney evaluation - Urine ACR  08/13/2024   HEMOGLOBIN A1C  08/13/2024   Diabetic kidney evaluation - eGFR measurement  02/12/2025   Medicare Annual Wellness (AWV)  02/17/2025   Influenza Vaccine  Completed   HPV VACCINES  Aged Out   Meningococcal B Vaccine  Aged Out    -See a dentist at least yearly  -Get your eyes checked and then per your eye specialist's  recommendations  -Other issues addressed today:   -I have included below further information regarding a healthy whole foods based diet, physical activity guidelines for adults, stress management and opportunities for social connections. I hope you find this information useful.   -----------------------------------------------------------------------------------------------------------------------------------------------------------------------------------------------------------------------------------------------------------    NUTRITION: -eat real food: lots of colorful vegetables (half the plate) and fruits -5-7 servings of vegetables and fruits per day (fresh or steamed is best), exp. 2 servings of vegetables with lunch and dinner and 2 servings of fruit per day. Berries and greens such as kale and collards are great choices.  -consume on a regular basis:  fresh fruits, fresh veggies, fish, nuts, seeds, healthy oils (such as olive oil, avocado oil), whole grains (make sure for bread/pasta/crackers/etc., that the first ingredient on label contains the word whole), legumes. -can eat small amounts of dairy and lean meat (no larger than the palm of your hand), but avoid processed meats such as ham, bacon, lunch meat, etc. -drink water  -try to avoid fast food and pre-packaged foods, processed meat, ultra processed foods/beverages (donuts, candy, etc.) -most experts advise limiting sodium to < 2300mg  per day, should limit further is any chronic conditions such as high blood pressure, heart disease, diabetes, etc. The American Heart Association advised that < 1500mg  is is ideal -try to avoid foods/beverages that contain any ingredients with names you do not recognize  -try to avoid foods/beverages  with added sugar or sweeteners/sweets  -  try to avoid sweet drinks (including diet drinks): soda, juice, Gatorade, sweet tea, power drinks, diet drinks -try to avoid white rice, white bread, pasta  (unless whole grain)  EXERCISE GUIDELINES FOR ADULTS: -if you wish to increase your physical activity, do so gradually and with the approval of your doctor -STOP and seek medical care immediately if you have any chest pain, chest discomfort or trouble breathing when starting or increasing exercise  -move and stretch your body, legs, feet and arms when sitting for long periods -Physical activity guidelines for optimal health in adults: -get at least 150 minutes per week of moderate exercise (can talk, but not sing); this is about 20-30 minutes of sustained activity 5-7 days per week or two 10-15 minute episodes of sustained activity 5-7 days per week -do some muscle building/resistance training/strength training at least 2 days per week  -balance exercises 3+ days per week:   Stand somewhere where you have something sturdy to hold onto if you lose balance    1) lift up on toes, then back down, start with 5x per day and work up to 20x   2) stand and lift one leg straight out to the side so that foot is a few inches of the floor, start with 5x each side and work up to 20x each side   3) stand on one foot, start with 5 seconds each side and work up to 20 seconds on each side  If you need ideas or help with getting more active:  -Silver sneakers https://tools.silversneakers.com  -Walk with a Doc: http://www.duncan-williams.com/  -try to include resistance (weight lifting/strength building) and balance exercises twice per week: or the following link for ideas: http://castillo-powell.com/  BuyDucts.dk  STRESS MANAGEMENT: -can try meditating, or just sitting quietly with deep breathing while intentionally relaxing all parts of your body for 5 minutes daily -if you need further help with stress, anxiety or depression please follow up with your primary doctor or contact the wonderful folks at [facility]: [phone]  SOCIAL CONNECTIONS: -options in [place] if you wish to engage in more social and exercise related activities:  -Silver sneakers https://tools.silversneakers.com  -Walk with a Doc: http://www.duncan-williams.com/  -Check out the [facility] Active Adults 50+ section on the [place] [employer] (hiking clubs, book clubs, cards and games, chess, exercise classes, aquatic classes and much more) - see the website for details: https://www.[place].gov/departments/parks-recreation/active-adults50  -YouTube has lots of exercise videos for different ages and abilities as well  -[facility] (a variety of indoor and outdoor inperson activities for adults). 9895401924. [address]  -Virtual Online Classes (a variety of topics): see seniorplanet.org or call [phone]  -consider volunteering at a school, hospice center, church, senior center or elsewhere

## 2024-02-18 NOTE — Progress Notes (Signed)
 PATIENT CHECK-IN and HEALTH RISK ASSESSMENT QUESTIONNAIRE:  -completed by phone/video for upcoming Medicare Preventive Visit   Pre-Visit Check-in: 1)Vitals (height, wt, BP, etc) - record in vitals section for visit on day of visit Request home vitals (wt, BP, etc.) and enter into vitals, THEN update Vital Signs SmartPhrase below at the top of the HPI. See below.  2)Review and Update Medications, Allergies PMH, Surgeries, Social history in Epic 3)Hospitalizations in the last year with date/reason? NO   4)Review and Update Care Team (patient's specialists) in Epic 5) Complete PHQ9 in Epic  6) Complete Fall Screening in Epic 7)Review all Health Maintenance Due and order if not done.  Medicare Wellness Patient Questionnaire:  Answer theses question about your habits: How often do you have a drink containing alcohol?No  How many drinks containing alcohol do you have on a typical day when you are drinking?Na  How often do you have six or more drinks on one occasion?NA  Have you ever smoked?NA  Quit date if applicable? Na   How many packs a day do/did you smoke? Na  Do you use smokeless tobacco?Na Do you use an illicit drugs?Na On average, how many days per week do you engage in moderate to strenuous exercise (like a brisk walk)?3 days  On average, how many minutes do you engage in exercise at this level?15-20 minutes, family is helping her with this. Are you sexually active? No Number of partners?NA  Typical breakfast: Fruit, grits and eggs and bacon  Typical lunch:Sandwich or salad  Typical dinner:Meat potatoes Vegetables  Typical snacks: Fruit, chips, crackers, cookies, italian ice   Beverages: Water , and sweet tea   Answer theses question about your everyday activities: Can you perform most household chores?No  Are you deaf or have significant trouble hearing?Unknown  Do you feel that you have a problem with memory?Yes  Do you feel safe at home?Unknown  Last dentist visit?over a  year  8. Do you have any difficulty performing your everyday activities? Yes, patient had a stroke  Are you having any difficulty walking, taking medications on your own, and or difficulty managing daily home needs?Yes  Do you have difficulty walking or climbing stairs?no  Do you have difficulty dressing or bathing?no  Do you have difficulty doing errands alone such as visiting a doctor's office or shopping?Yes, patient needs help  Do you currently have any difficulty preparing food and eating? Yes  Do you currently have any difficulty using the toilet?No  Do you have any difficulty managing your finances?Yes  Do you have any difficulties with housekeeping of managing your housekeeping?no    Do you have Advanced Directives in place (Living Will, Healthcare Power or Attorney)? Daughter is HCPOA   Last eye Exam and location?unknown    Do you currently use prescribed or non-prescribed narcotic or opioid pain medications?yes   Do you have a history or close family history of breast, ovarian, tubal or peritoneal cancer or a family member with BRCA (breast cancer susceptibility 1 and 2) gene mutations? No    Nurse/Assistant Credentials/time stamp: Courtney Burke CMA 12pm     ----------------------------------------------------------------------------------------------------------------------------------------------------------------------------------------------------------------------  Because this visit was a virtual/telehealth visit, some criteria may be missing or patient reported. Any vitals not documented were not able to be obtained and vitals that have been documented are patient reported.    MEDICARE ANNUAL PREVENTIVE VISIT WITH PROVIDER: (Welcome to Medicare, initial annual wellness or annual wellness exam)  Virtual Visit via Phone Note  I connected with Courtney  Courtney Burke on 02/18/24 by phone and verified that I am speaking with the correct person using two identifiers.  They prefer phone.   Location patient: home Location provider:work or home office Persons participating in the virtual visit: patient, provider, daughter Courtney Burke, caregiver  Concerns and/or follow up today: just saw Dr. Mercer. They are seeing Nephrology about her kidneys. Patient is moving a lot more - doing more exercise. Lives with Daughter Courtney Burke.    See HM section in Epic for other details of completed HM.    ROS: negative for report of fevers, unintentional weight loss, vision changes, vision loss, hearing loss or change, chest pain, sob, hemoptysis, melena, hematochezia, hematuria, falls, bleeding or bruising, thoughts of suicide or self harm, memory loss  Patient-completed extensive health risk assessment - reviewed and discussed with the patient: See Health Risk Assessment completed with patient prior to the visit either above or in recent phone note. This was reviewed in detailed with the patient today and appropriate recommendations, orders and referrals were placed as needed per Summary below and patient instructions.   Review of Medical History: -PMH, PSH, Family History and current specialty and care providers reviewed and updated and listed below   Patient Care Team: Courtney Clotilda SAUNDERS, MD as PCP - General (Family Medicine) Courtney Soyla Lunger, MD as PCP - Electrophysiology (Cardiology) Courtney Burke, CCC-SLP (Inactive) (General Practice) Courtney Raisin, NP as Nurse Practitioner (Neurology) Carilyn, Prentice BRAVO, MD as Consulting Physician (Physical Medicine and Rehabilitation) Dasher, Lynwood, MD (Surgery) Courtney Burke (Family Medicine) Courtney Soyla Lunger, MD as Consulting Physician (Cardiology) Courtney Mateo Larger, MD as Consulting Physician (Nephrology)   Past Medical History:  Diagnosis Date   Acute ischemic left MCA stroke (HCC) 06/30/2020   Acute ischemic stroke [facility])    Acute stroke due to occlusion of left middle cerebral artery (HCC)  06/30/2020   Age-related nuclear cataract of both eyes 09/28/2015   Arthralgia of multiple joints 09/28/2015   Asthma    Borderline diabetes    Cerebrovascular accident (CVA) (HCC)    Chronic bilateral thoracic back pain 06/05/2018   Chronic renal insufficiency, stage 1 08/11/2014   Diabetes mellitus type 2 in obese    Dyslipidemia    Dysphagia, post-stroke    Erosive gastritis 07/20/2019   Formatting of this note might be different from the original. On EGD 07/2019   Esophagitis 07/20/2019   Formatting of this note might be different from the original. Added automatically from request for surgery [id number]  Formatting of this note might be different from the original. On EGD 07/2019   Essential hypertension 09/28/2015   Essential thrombocytosis (HCC) 09/28/2015   Gallstones 08/18/2019   Formatting of this note might be different from the original. 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   Morbid obesity (HCC) 05/21/2013   Seizures (HCC)    Stage 3b chronic kidney disease (HCC)    Status post gastric bypass for obesity 09/28/2015    Past Surgical History:  Procedure Laterality Date   ANKLE SURGERY     CARPAL TUNNEL RELEASE     carpel tunnel     ESOPHAGOGASTRODUODENOSCOPY (EGD) WITH PROPOFOL  N/A 11/14/2021   Procedure: ESOPHAGOGASTRODUODENOSCOPY (EGD) WITH PROPOFOL ;  Surgeon: Avram Lupita BRAVO,  MD;  Location: [facility];  Service: Gastroenterology;  Laterality: N/A;   IR ANGIO INTRA EXTRACRAN SEL COM CAROTID INNOMINATE BILAT MOD SED  01/17/2021   IR ANGIO VERTEBRAL SEL SUBCLAVIAN INNOMINATE UNI R MOD SED  01/17/2021   IR CT HEAD LTD  06/30/2020   IR INTRA CRAN STENT  06/30/2020    IR PERCUTANEOUS ART THROMBECTOMY/INFUSION INTRACRANIAL INC DIAG ANGIO  06/30/2020   IR RADIOLOGIST EVAL & MGMT  08/26/2020   IR US  GUIDE VASC ACCESS RIGHT  01/17/2021   RADIOLOGY WITH ANESTHESIA N/A 06/30/2020   Procedure: RADIOLOGY WITH ANESTHESIA;  Surgeon: Radiologist, Medication, MD;  Location: [facility] OR;  Service: Radiology;  Laterality: N/A;    Social History   Socioeconomic History   Marital status: Divorced    Spouse name: Not on file   Number of children: Not on file   Years of education: Not on file   Highest education level: Bachelor's degree (e.g., BA, AB, BS)  Occupational History   Not on file  Tobacco Use   Smoking status: Never   Smokeless tobacco: Never  Vaping Use   Vaping status: Never Used  Substance and Sexual Activity   Alcohol use: No   Drug use: No   Sexual activity: Not on file  Other Topics Concern   Not on file  Social History Narrative   Lives with daughter Courtney Burke   Right Handed   Drinks no caffeine   Retired Engineer, 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[employer] [place]    Social Drivers of Health   Financial Resource Strain: High Risk (02/14/2024)   Overall Financial Resource Strain (CARDIA)    Difficulty of Paying Living Expenses: Hard  Food Insecurity: Food Insecurity Present (02/14/2024)   Hunger Vital Sign    Worried About Running Out of Food in the Last Year: Often true    Ran Out of Food in the Last Year: Never true  Transportation Needs: No Transportation Needs (02/14/2024)   PRAPARE - Administrator, Civil Service (Medical): No    Lack of Transportation (Non-Medical): No  Physical Activity: Unknown (02/14/2024)   Exercise Vital Sign    Days of Exercise per Week: 1 day    Minutes of Exercise per Session: Not on file  Stress: Stress Concern Present (02/14/2024)   [employer] of Occupational Health - Occupational Stress Questionnaire    Feeling of Stress: Very much  Social Connections: Socially Isolated  (02/14/2024)   Social Connection and Isolation Panel    Frequency of Communication with Friends and Family: Never    Frequency of Social Gatherings with Friends and Family: Never    Attends Religious Services: Never    Database administrator or Organizations: No    Attends Engineer, structural: Not on file    Marital Status: Divorced  Intimate Partner Violence: Not At Risk (06/29/2022)   Humiliation, Afraid, Rape, and Kick questionnaire    Fear of Current or Ex-Partner: No    Emotionally Abused: No    Physically Abused: No    Sexually Abused: No    Family History  Problem Relation Age of Onset   Stroke Maternal Grandfather     Current Outpatient Medications on File Prior to Visit  Medication Sig Dispense Refill   acetaminophen  (TYLENOL ) 500 MG tablet Take 1,000 mg by mouth every 6 (six) hours as needed for moderate pain or headache.     amitriptyline  (ELAVIL ) 50 MG tablet Take 1  tablet (50 mg total) by mouth at bedtime. 90 tablet 3   amLODipine  (NORVASC ) 5 MG tablet Take 1 tablet (5 mg total) by mouth daily. 90 tablet 3   aspirin  EC 81 MG tablet Take 1 tablet (81 mg total) by mouth daily. Swallow whole.     atorvastatin  (LIPITOR ) 80 MG tablet Take 1 tablet (80 mg total) by mouth daily. 90 tablet 3   cholecalciferol  (VITAMIN D3) 25 MCG (1000 UNIT) tablet Take by mouth.     diazePAM , 15 MG Dose, (VALTOCO  15 MG DOSE) 2 x 7.5 MG/0.1ML LQPK Place 15 mg into the nose as needed (seizure rescue). At onset of seizure, may repeat once based on response and tolerability after >=4 hours. Maximum dose: Two doses per episode. Do not use for more than 1 episode every 5 days or more than 5 episodes per month. 5 each 5   DULoxetine  (CYMBALTA ) 30 MG capsule TAKE 1 CAPSULE BY MOUTH EVERY DAY 90 capsule 4   ELIQUIS  5 MG TABS tablet TAKE 1 TABLET BY MOUTH TWICE A DAY 60 tablet 5   KLOR-CON  M20 20 MEQ tablet Take 40 mEq by mouth 2 (two) times daily.     levETIRAcetam  (KEPPRA ) 750 MG tablet Take 1  tablet (750 mg total) by mouth 2 (two) times daily. 180 tablet 3   Multiple Vitamin (MULTIVITAMIN WITH MINERALS) TABS tablet Take 1 tablet by mouth daily.     NURTEC 75 MG TBDP TAKE 1 TABLET (75 MG TOTAL) BY MOUTH AS NEEDED. 16 tablet 5   omeprazole (PRILOSEC) 40 MG capsule Take 1 tablet by mouth daily.     pantoprazole  (PROTONIX ) 40 MG tablet Take 40 mg by mouth daily.     potassium chloride  SA (KLOR-CON  M) 20 MEQ tablet Take 1 tablet (20 mEq total) by mouth daily. Take 2 tabs (40mEq) daily for 4 days.  Then take 1 tab (20 mEq) daily. 90 tablet 3   topiramate  (TOPAMAX ) 100 MG tablet Take 1 tablet (100 mg total) by mouth 2 (two) times daily. Take 1 tablet twice daily 180 tablet 3   ursodiol  (ACTIGALL ) 250 MG tablet Take 250 mg by mouth 2 (two) times daily.     Vitamin D , Ergocalciferol , (DRISDOL ) 1.25 MG (50000 UNIT) CAPS capsule Take 1 capsule (50,000 Units total) by mouth every 7 (seven) days. 12 capsule 0   No current facility-administered medications on file prior to visit.    Allergies  Allergen Reactions   Homatropine Itching   Iodinated Contrast Media Itching and Other (See Comments)   Doxycycline Nausea And Vomiting and Other (See Comments)   Sulfa Antibiotics Rash and Hives   Codeine Hives and Swelling    Swollen tongue   Hydrocodone Itching   Hydrocodone Bit-Homatrop Mbr Itching   Sulfamethoxazole Hives   Ace Inhibitors Cough, Itching and Rash       Physical Exam Vitals requested from patient and listed below if patient had equipment and was able to obtain at home for this virtual visit: There were no vitals filed for this visit. Estimated body mass index is 30.25 kg/m as calculated from the following:   Height as of 02/14/24: 5' 2 (1.575 m).   Weight as of 02/14/24: 165 lb 6.4 oz (75 kg).  EKG (optional): deferred due to virtual visit  GENERAL: alert, oriented, no acute distress detected, full vision exam deferred due to pandemic and/or virtual  encounter  PSYCH/NEURO: pleasant and cooperative, no obvious depression or anxiety, speech and thought processing grossly intact,  Cognitive function grossly intact  Flowsheet Row Office Visit from [facility]/13/2023 in [facility] Total Score 7        02/13/2023    4:01 PM 06/29/2022    2:47 PM 03/28/2022   10:17 AM 07/18/2021    7:06 PM 07/14/2021   10:02 AM  Depression screen PHQ 2/9  Decreased Interest 0 0 1 0 1  Down, Depressed, Hopeless 2 0 1 1 1   PHQ - 2 Score 2 0 2 1 2   Altered sleeping 2  1    Tired, decreased energy 1  1    Change in appetite 0  0    Feeling bad or failure about yourself  1  0    Trouble concentrating 0  1    Moving slowly or fidgety/restless 1  1    Suicidal thoughts 0  0    PHQ-9 Score 7  6    Difficult doing work/chores Somewhat difficult  Not difficult at all         11/15/2021    9:00 AM 03/28/2022   10:17 AM 06/29/2022    2:52 PM 02/13/2023    4:01 PM 01/01/2024    9:39 AM  Fall Risk  Falls in the past year?  1 1 0 1  Was there an injury with Fall?  0 0 0 0  Fall Risk Category Calculator  1 1 0 2  Fall Risk Category (Retired)  Low      (RETIRED) Patient Fall Risk Level Moderate fall risk  Low fall risk      Patient at Risk for Falls Due to  Impaired balance/gait No Fall Risks Impaired balance/gait   Fall risk Follow up  Falls evaluation completed  Falls prevention discussed Falls evaluation completed Falls evaluation completed     Data saved with a previous flowsheet row definition     SUMMARY AND PLAN:  Medicare annual wellness visit, subsequent  Encounter for screening mammogram for malignant neoplasm of breast - Plan: MM 3D SCREENING MAMMOGRAM BILATERAL BREAST   Discussed applicable health maintenance/preventive health measures and advised and referred or ordered per patient preferences: -discussed mammogram , dexa, colon ca screening  -she wanted us  to go ahead and order the mammogram, orders  placed -daughter is going to check in on last bone density, last colon cancer screening and immunizations from prior PCP office and follow up with us  about these - also asked staff to check on this and try to obtain reports.  -daughter says she will schedule eye exam Health Maintenance  Topic Date Due   OPHTHALMOLOGY EXAM  Never done   COVID-19 Vaccine (4 - 2025-26 season) 02/03/2024   Zoster Vaccines- Shingrix (1 of 2) 04/02/2024 (Originally 07/29/1974)   DTaP/Tdap/Td (6 - Td or Tdap) 12/31/2024 (Originally 06/27/2023)   Pneumococcal Vaccine: 50+ Years (2 of 2 - PCV) 12/31/2024 (Originally 10/02/2003)   Mammogram  12/31/2024 (Originally 07/06/2023)   DEXA SCAN  12/31/2024 (Originally 07/30/2019)   Colonoscopy  12/31/2024 (Originally 07/30/1999)   Hepatitis C Screening  12/31/2024 (Originally [birth year]1972)   FOOT EXAM  05/14/2024   Diabetic kidney evaluation - Urine ACR  08/13/2024   HEMOGLOBIN A1C  08/13/2024   Diabetic kidney evaluation - eGFR measurement  02/12/2025   Medicare Annual Wellness (AWV)  02/17/2025   Influenza Vaccine  Completed   HPV VACCINES  Aged Out   Meningococcal B Vaccine  Aged [employer] and counseling on the  following was provided based on the above review of health and a plan/checklist for the patient, along with additional information discussed, was provided for the patient in the patient instructions :  -Advised on importance of completing advanced directives, discussed options for completing and provided information in patient instructions as well -Advised and counseled on a healthy lifestyle  -Reviewed patient's current diet. Advised and counseled on a whole foods based healthy diet. A summary of a healthy diet was provided in the Patient Instructions.  -reviewed patient's current physical activity level and discussed exercise guidelines for adults. Congratulated on exercise they have started and encouraged to continue. Further resources provided in patient  instructions.  -Advise yearly dental visits at minimum and regular eye exams   Follow up: see patient instructions     Patient Instructions  I really enjoyed getting to talk with you today! I am available on Tuesdays and Thursdays for virtual visits if you have any questions or concerns, or if I can be of any further assistance.   CHECKLIST FROM ANNUAL WELLNESS VISIT:  -Follow up (please call to schedule if not scheduled after visit):   -yearly for annual wellness visit with primary care office  Here is a list of your preventive care/health maintenance measures and the plan for each if any are due:  PLAN For any measures below that may be due:    1. Please obtain report for last colon cancer screening test (cologuard, stool cards or cologuard test) and provide to our office so that we can ensure colon cancer testing is up to date.   2. Please obtain last bone density test report and immunization record and provide to our office so that we can update these.   3. We ordered the mammogram. You can schedule by calling [phone].   4. Please schedule eye exam and send report to Dr. Mercer.  Health Maintenance  Topic Date Due   OPHTHALMOLOGY EXAM  Never done   COVID-19 Vaccine (4 - 2025-26 season) 02/03/2024   Zoster Vaccines- Shingrix (1 of 2) 04/02/2024 (Originally 07/29/1974)   DTaP/Tdap/Td (6 - Td or Tdap) 12/31/2024 (Originally 06/27/2023)   Pneumococcal Vaccine: 50+ Years (2 of 2 - PCV) 12/31/2024 (Originally 10/02/2003)   Mammogram  12/31/2024 (Originally 07/06/2023)   DEXA SCAN  12/31/2024 (Originally 07/30/2019)   Colonoscopy  12/31/2024 (Originally 07/30/1999)   Hepatitis C Screening  12/31/2024 (Originally [birth year]1972)   FOOT EXAM  05/14/2024   Diabetic kidney evaluation - Urine ACR  08/13/2024   HEMOGLOBIN A1C  08/13/2024   Diabetic kidney evaluation - eGFR measurement  02/12/2025   Medicare Annual Wellness (AWV)  02/17/2025   Influenza Vaccine  Completed   HPV VACCINES   Aged Out   Meningococcal B Vaccine  Aged Out    -See a dentist at least yearly  -Get your eyes checked and then per your eye specialist's recommendations  -Other issues addressed today:   -I have included below further information regarding a healthy whole foods based diet, physical activity guidelines for adults, stress management and opportunities for social connections. I hope you find this information useful.   -----------------------------------------------------------------------------------------------------------------------------------------------------------------------------------------------------------------------------------------------------------    NUTRITION: -eat real food: lots of colorful vegetables (half the plate) and fruits -5-7 servings of vegetables and fruits per day (fresh or steamed is best), exp. 2 servings of vegetables with lunch and dinner and 2 servings of fruit per day. Berries and greens such as kale and collards are great choices.  -consume on a regular basis:  fresh  fruits, fresh veggies, fish, nuts, seeds, healthy oils (such as olive oil, avocado oil), whole grains (make sure for bread/pasta/crackers/etc., that the first ingredient on label contains the word whole), legumes. -can eat small amounts of dairy and lean meat (no Burke than the palm of your hand), but avoid processed meats such as ham, bacon, lunch meat, etc. -drink water  -try to avoid fast food and pre-packaged foods, processed meat, ultra processed foods/beverages (donuts, candy, etc.) -most experts advise limiting sodium to < 2300mg  per day, should limit further is any chronic conditions such as high blood pressure, heart disease, diabetes, etc. The American Heart Association advised that < 1500mg  is is ideal -try to avoid foods/beverages that contain any ingredients with names you do not recognize  -try to avoid foods/beverages  with added sugar or sweeteners/sweets  -try to avoid  sweet drinks (including diet drinks): soda, juice, Gatorade, sweet tea, power drinks, diet drinks -try to avoid white rice, white bread, pasta (unless whole grain)  EXERCISE GUIDELINES FOR ADULTS: -if you wish to increase your physical activity, do so gradually and with the approval of your doctor -STOP and seek medical care immediately if you have any chest pain, chest discomfort or trouble breathing when starting or increasing exercise  -move and stretch your body, legs, feet and arms when sitting for long periods -Physical activity guidelines for optimal health in adults: -get at least 150 minutes per week of moderate exercise (can talk, but not sing); this is about 20-30 minutes of sustained activity 5-7 days per week or two 10-15 minute episodes of sustained activity 5-7 days per week -do some muscle building/resistance training/strength training at least 2 days per week  -balance exercises 3+ days per week:   Stand somewhere where you have something sturdy to hold onto if you lose balance    1) lift up on toes, then back down, start with 5x per day and work up to 20x   2) stand and lift one leg straight out to the side so that foot is a few inches of the floor, start with 5x each side and work up to 20x each side   3) stand on one foot, start with 5 seconds each side and work up to 20 seconds on each side  If you need ideas or help with getting more active:  -Silver sneakers https://tools.silversneakers.com  -Walk with a Doc: http://www.duncan-williams.com/  -try to include resistance (weight lifting/strength building) and balance exercises twice per week: or the following link for ideas: http://castillo-powell.com/  BuyDucts.dk  STRESS MANAGEMENT: -can try meditating, or just sitting quietly with deep breathing while intentionally relaxing all parts of your body for 5 minutes daily -if you need  further help with stress, anxiety or depression please follow up with your primary doctor or contact the wonderful folks at [facility]: [phone]  SOCIAL CONNECTIONS: -options in [place] if you wish to engage in more social and exercise related activities:  -Silver sneakers https://tools.silversneakers.com  -Walk with a Doc: http://www.duncan-williams.com/  -Check out the [facility] Active Adults 50+ section on the [place] [employer] (hiking clubs, book clubs, cards and games, chess, exercise classes, aquatic classes and much more) - see the website for details: https://www.[place].gov/departments/parks-recreation/active-adults50  -YouTube has lots of exercise videos for different ages and abilities as well  -[facility] (a variety of indoor and outdoor inperson activities for adults). [phone]. [address]  -Virtual Online Classes (a variety of topics): see seniorplanet.org or call [phone]  -consider volunteering  at a school, hospice center, church, senior center or elsewhere            Chiquita JONELLE Cramp, DO

## 2024-02-25 ENCOUNTER — Emergency Department (HOSPITAL_COMMUNITY)

## 2024-02-25 ENCOUNTER — Other Ambulatory Visit: Payer: Self-pay

## 2024-02-25 ENCOUNTER — Telehealth: Payer: Self-pay | Admitting: Adult Health

## 2024-02-25 ENCOUNTER — Other Ambulatory Visit (HOSPITAL_COMMUNITY): Payer: Self-pay

## 2024-02-25 ENCOUNTER — Observation Stay (HOSPITAL_COMMUNITY)

## 2024-02-25 ENCOUNTER — Inpatient Hospital Stay (HOSPITAL_COMMUNITY)
Admission: EM | Admit: 2024-02-25 | Discharge: 2024-02-29 | DRG: 101 | Disposition: A | Discharge status: Home / Self Care | Attending: Family Medicine | Admitting: Family Medicine

## 2024-02-25 ENCOUNTER — Telehealth: Payer: Self-pay

## 2024-02-25 DIAGNOSIS — E876 Hypokalemia: Secondary | ICD-10-CM

## 2024-02-25 DIAGNOSIS — Z9884 Bariatric surgery status: Secondary | ICD-10-CM

## 2024-02-25 DIAGNOSIS — G40909 Epilepsy, unspecified, not intractable, without status epilepticus: Secondary | ICD-10-CM | POA: Diagnosis not present

## 2024-02-25 DIAGNOSIS — I10 Essential (primary) hypertension: Secondary | ICD-10-CM | POA: Diagnosis present

## 2024-02-25 DIAGNOSIS — I480 Paroxysmal atrial fibrillation: Secondary | ICD-10-CM | POA: Diagnosis present

## 2024-02-25 DIAGNOSIS — Z882 Allergy status to sulfonamides status: Secondary | ICD-10-CM

## 2024-02-25 DIAGNOSIS — E8729 Other acidosis: Secondary | ICD-10-CM | POA: Diagnosis present

## 2024-02-25 DIAGNOSIS — E878 Other disorders of electrolyte and fluid balance, not elsewhere classified: Secondary | ICD-10-CM | POA: Diagnosis present

## 2024-02-25 DIAGNOSIS — I951 Orthostatic hypotension: Secondary | ICD-10-CM | POA: Diagnosis present

## 2024-02-25 DIAGNOSIS — G40919 Epilepsy, unspecified, intractable, without status epilepticus: Secondary | ICD-10-CM

## 2024-02-25 DIAGNOSIS — N1832 Chronic kidney disease, stage 3b: Secondary | ICD-10-CM | POA: Diagnosis present

## 2024-02-25 DIAGNOSIS — Z7901 Long term (current) use of anticoagulants: Secondary | ICD-10-CM

## 2024-02-25 DIAGNOSIS — E785 Hyperlipidemia, unspecified: Secondary | ICD-10-CM | POA: Diagnosis present

## 2024-02-25 DIAGNOSIS — Z885 Allergy status to narcotic agent status: Secondary | ICD-10-CM

## 2024-02-25 DIAGNOSIS — K219 Gastro-esophageal reflux disease without esophagitis: Secondary | ICD-10-CM | POA: Diagnosis present

## 2024-02-25 DIAGNOSIS — Z79899 Other long term (current) drug therapy: Secondary | ICD-10-CM

## 2024-02-25 DIAGNOSIS — E1122 Type 2 diabetes mellitus with diabetic chronic kidney disease: Secondary | ICD-10-CM | POA: Diagnosis present

## 2024-02-25 DIAGNOSIS — Z7982 Long term (current) use of aspirin: Secondary | ICD-10-CM

## 2024-02-25 DIAGNOSIS — I69398 Other sequelae of cerebral infarction: Secondary | ICD-10-CM

## 2024-02-25 DIAGNOSIS — I129 Hypertensive chronic kidney disease with stage 1 through stage 4 chronic kidney disease, or unspecified chronic kidney disease: Secondary | ICD-10-CM | POA: Diagnosis present

## 2024-02-25 DIAGNOSIS — Z903 Acquired absence of stomach [part of]: Secondary | ICD-10-CM

## 2024-02-25 DIAGNOSIS — I69320 Aphasia following cerebral infarction: Secondary | ICD-10-CM

## 2024-02-25 DIAGNOSIS — Z91041 Radiographic dye allergy status: Secondary | ICD-10-CM

## 2024-02-25 DIAGNOSIS — Z8673 Personal history of transient ischemic attack (TIA), and cerebral infarction without residual deficits: Secondary | ICD-10-CM

## 2024-02-25 DIAGNOSIS — I69391 Dysphagia following cerebral infarction: Secondary | ICD-10-CM

## 2024-02-25 DIAGNOSIS — R569 Unspecified convulsions: Principal | ICD-10-CM

## 2024-02-25 DIAGNOSIS — F39 Unspecified mood [affective] disorder: Secondary | ICD-10-CM | POA: Diagnosis present

## 2024-02-25 LAB — URINALYSIS, W/ REFLEX TO CULTURE (INFECTION SUSPECTED)
Bilirubin Urine: NEGATIVE
Glucose, UA: NEGATIVE mg/dL
Hgb urine dipstick: NEGATIVE
Ketones, ur: NEGATIVE mg/dL
Nitrite: NEGATIVE
Protein, ur: NEGATIVE mg/dL
Specific Gravity, Urine: 1.01 (ref 1.005–1.030)
pH: 5 (ref 5.0–8.0)

## 2024-02-25 LAB — COMPREHENSIVE METABOLIC PANEL WITH GFR
ALT: 10 U/L (ref 0–44)
AST: 17 U/L (ref 15–41)
Albumin: 3.1 g/dL — ABNORMAL LOW (ref 3.5–5.0)
Alkaline Phosphatase: 142 U/L — ABNORMAL HIGH (ref 38–126)
Anion gap: 9 (ref 5–15)
BUN: 17 mg/dL (ref 8–23)
CO2: 17 mmol/L — ABNORMAL LOW (ref 22–32)
Calcium: 8.5 mg/dL — ABNORMAL LOW (ref 8.9–10.3)
Chloride: 113 mmol/L — ABNORMAL HIGH (ref 98–111)
Creatinine, Ser: 1.97 mg/dL — ABNORMAL HIGH (ref 0.44–1.00)
GFR, Estimated: 27 mL/min — ABNORMAL LOW (ref >60)
Glucose, Bld: 87 mg/dL (ref 70–99)
Potassium: 4.3 mmol/L (ref 3.5–5.1)
Sodium: 139 mmol/L (ref 135–145)
Total Bilirubin: 0.6 mg/dL (ref 0.0–1.2)
Total Protein: 6 g/dL — ABNORMAL LOW (ref 6.5–8.1)

## 2024-02-25 LAB — CBC WITH DIFFERENTIAL/PLATELET
Abs Immature Granulocytes: 0.02 K/uL (ref 0.00–0.07)
Basophils Absolute: 0 K/uL (ref 0.0–0.1)
Basophils Relative: 1 %
Eosinophils Absolute: 0.2 K/uL (ref 0.0–0.5)
Eosinophils Relative: 4 %
HCT: 34.7 % — ABNORMAL LOW (ref 36.0–46.0)
Hemoglobin: 11.2 g/dL — ABNORMAL LOW (ref 12.0–15.0)
Immature Granulocytes: 0 %
Lymphocytes Relative: 30 %
Lymphs Abs: 1.4 K/uL (ref 0.7–4.0)
MCH: 27.5 pg (ref 26.0–34.0)
MCHC: 32.3 g/dL (ref 30.0–36.0)
MCV: 85.3 fL (ref 80.0–100.0)
Monocytes Absolute: 0.3 K/uL (ref 0.1–1.0)
Monocytes Relative: 7 %
Neutro Abs: 2.8 K/uL (ref 1.7–7.7)
Neutrophils Relative %: 58 %
Platelets: 310 K/uL (ref 150–400)
RBC: 4.07 MIL/uL (ref 3.87–5.11)
RDW: 14 % (ref 11.5–15.5)
WBC: 4.8 K/uL (ref 4.0–10.5)
nRBC: 0 % (ref 0.0–0.2)

## 2024-02-25 MED ORDER — ACETAMINOPHEN 650 MG RE SUPP: 650.0000 mg | Freq: Four times a day (QID) | RECTAL | Status: DC | PRN

## 2024-02-25 MED ORDER — LEVETIRACETAM (KEPPRA) 500 MG/5 ML ADULT IV PUSH
750.0000 mg | Freq: Two times a day (BID) | INTRAVENOUS | Status: DC
  Administered 2024-02-25 – 2024-02-27 (×4): 750 mg via INTRAVENOUS
  Filled 2024-02-25 (×4): qty 10

## 2024-02-25 MED ORDER — LORAZEPAM 2 MG/ML IJ SOLN
1.0000 mg | INTRAMUSCULAR | Status: DC | PRN
  Administered 2024-02-26: 1 mg via INTRAVENOUS
  Filled 2024-02-25: qty 1

## 2024-02-25 MED ORDER — TOPIRAMATE 100 MG PO TABS
100.0000 mg | ORAL_TABLET | Freq: Two times a day (BID) | ORAL | Status: DC
Start: 2024-02-25 — End: 2024-02-29
  Administered 2024-02-25 – 2024-02-29 (×7): 100 mg via ORAL
  Filled 2024-02-25: qty 1
  Filled 2024-02-25 (×2): qty 4
  Filled 2024-02-25 (×5): qty 1

## 2024-02-25 MED ORDER — ASPIRIN 81 MG PO TBEC
81.0000 mg | DELAYED_RELEASE_TABLET | Freq: Every day | ORAL | Status: DC
  Administered 2024-02-27 – 2024-02-29 (×3): 81 mg via ORAL
  Filled 2024-02-25 (×3): qty 1

## 2024-02-25 MED ORDER — ATORVASTATIN CALCIUM 80 MG PO TABS
80.0000 mg | ORAL_TABLET | Freq: Every day | ORAL | Status: DC
  Administered 2024-02-27 – 2024-02-29 (×3): 80 mg via ORAL
  Filled 2024-02-25 (×3): qty 1

## 2024-02-25 MED ORDER — SODIUM CHLORIDE 0.9 % IV SOLN: INTRAVENOUS | Status: AC

## 2024-02-25 MED ORDER — LEVETIRACETAM (KEPPRA) 500 MG/5 ML ADULT IV PUSH
1000.0000 mg | Freq: Once | INTRAVENOUS | Status: AC
  Administered 2024-02-25: 1000 mg via INTRAVENOUS
  Filled 2024-02-25: qty 10

## 2024-02-25 MED ORDER — DULOXETINE HCL 30 MG PO CPEP
30.0000 mg | ORAL_CAPSULE | Freq: Every day | ORAL | Status: DC
  Administered 2024-02-27 – 2024-02-29 (×3): 30 mg via ORAL
  Filled 2024-02-25 (×3): qty 1

## 2024-02-25 MED ORDER — ACETAMINOPHEN 325 MG PO TABS: 650.0000 mg | ORAL_TABLET | Freq: Four times a day (QID) | ORAL | Status: DC | PRN

## 2024-02-25 MED ORDER — POLYETHYLENE GLYCOL 3350 17 G PO PACK: 17.0000 g | PACK | Freq: Every day | ORAL | Status: DC | PRN

## 2024-02-25 MED ORDER — URSODIOL 300 MG PO CAPS
300.0000 mg | ORAL_CAPSULE | Freq: Two times a day (BID) | ORAL | Status: DC
  Administered 2024-02-25 – 2024-02-29 (×7): 300 mg via ORAL
  Filled 2024-02-25 (×9): qty 1

## 2024-02-25 MED ORDER — APIXABAN 5 MG PO TABS
5.0000 mg | ORAL_TABLET | Freq: Two times a day (BID) | ORAL | Status: DC
Start: 2024-02-25 — End: 2024-02-29
  Administered 2024-02-25 – 2024-02-29 (×7): 5 mg via ORAL
  Filled 2024-02-25 (×7): qty 1

## 2024-02-25 MED ORDER — AMITRIPTYLINE HCL 25 MG PO TABS
50.0000 mg | ORAL_TABLET | Freq: Every day | ORAL | Status: DC
Start: 2024-02-25 — End: 2024-02-29
  Administered 2024-02-25 – 2024-02-28 (×4): 50 mg via ORAL
  Filled 2024-02-25 (×4): qty 2

## 2024-02-25 MED ORDER — ONDANSETRON HCL 4 MG PO TABS: 4.0000 mg | ORAL_TABLET | Freq: Four times a day (QID) | ORAL | Status: DC | PRN

## 2024-02-25 MED ORDER — ONDANSETRON HCL 4 MG/2ML IJ SOLN: 4.0000 mg | Freq: Four times a day (QID) | INTRAMUSCULAR | Status: DC | PRN

## 2024-02-25 NOTE — ED Notes (Signed)
 Placed patient on bedpan to encourage urine sample.

## 2024-02-25 NOTE — Progress Notes (Cosign Needed Addendum)
 STAT LTM EEG hooked up and running - no initial skin breakdown -MRI safe leads used. Not monitored at this time pt in ED

## 2024-02-25 NOTE — H&P (Signed)
 History and Physical  Patient: Courtney Burke FMW:[id number] DOB: 12-28-1955 DOA: 02/25/2024 DOS: the patient was seen and examined on 02/25/2024 Patient coming from: Home  Chief Complaint: Seizure like episode.  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 Patient presented to the hospital with staring spells. Patient was at home.  Lives with her brother.  Sitting in the chair and suddenly started having staring spell. Daughter at bedside reports that she is having this episode of almost once a month prior to this admission but for last 1 week these have been on a daily basis. This episode starts with bleeding of sweat on her forehead followed by her being not responsive and staring in a void followed by her being confused. Daughter reports that the patient has been compliant with all her medication.  No recent change in medications reported.  Does not take any over-the-counter supplements as well. Has been eating drinking okay. No nausea no vomiting. No fever no chills no burning urination.  No cough. No fall no trauma no injury reported recently as well.  Neurology was consulted in the ED.  Patient is started on LTM EEG monitoring.  Assessment and plan. Seizure disorder with acute seizures. Mentation appears to be close to baseline right now. Able to follow commands. Presents with staring spells. No tonic-clonic movement, tongue biting reported. Compliance is reported by the family.  Unknown etiology for now. CT of the head unremarkable. No new focal deficits seen. Neurology following the patient. Received 1 g IV Keppra  loading dose. Will continue with Keppra  twice daily 750 mg IV for now although neurology specifies further. On LTM EEG monitoring. Ativan  as needed. PT consult  ordered.  Prior history of CVA with expressive aphasia. No new focal deficit. CT of the head unremarkable. On Eliquis  at home.  Which I will continue. Unable to get an MRI brain. Will continue with baby aspirin  as well. SLP consulted.  Orthostatic hypotension. Concern for vasovagal syncope. Systolic blood pressure 111/75 lying, on standing drops down to 89/69. Will stop Norvasc . Provide IV hydration.  HLD. On statin. Lipitor  80 mg continue.  Mood disorder. On amitriptyline . Continue.  Paroxysmal A-fib. Currently rate controlled. Not on any rate control medication at home. On Eliquis  at home. Has ILR. Will require ILR interrogation. Monitor on telemetry for now.  GERD.   She will continue PPI.   Advance Care Planning:   Code Status: Full Code discussed with patient and family. Chaplain consulted for [facility] documents. Patient wants her daughter to be her HCPOA.  Consults: Neurology  Prior to Admission medications   Medication Sig Start Date End Date Taking? Authorizing Provider  acetaminophen  (TYLENOL ) 500 MG tablet Take 1,000 mg by mouth every 6 (six) hours as needed for moderate pain or headache.    [provider]  amitriptyline  (ELAVIL ) 50 MG tablet Take 1 tablet (50 mg total) by mouth at bedtime. 02/13/24   Whitfield Raisin, NP  amLODipine  (NORVASC ) 10 MG tablet Take 10 mg by mouth daily. 02/20/24   [provider]  amLODipine  (NORVASC ) 5 MG tablet Take 1 tablet (5 mg total) by mouth daily. 02/14/24   [name], MD  aspirin  EC 81 MG tablet Take 1 tablet (81 mg total) by mouth daily. Swallow whole. 06/29/21   Whitfield Raisin, NP  atorvastatin  (LIPITOR ) 80 MG tablet  Take 1 tablet (80 mg total) by mouth daily. 08/14/23   [name], MD  cholecalciferol  (VITAMIN D3) 25 MCG (1000 UNIT) tablet Take by mouth. 09/30/20   [provider]  diazePAM , 15 MG Dose, (VALTOCO  15 MG DOSE) 2 x 7.5 MG/0.1ML LQPK Place 15 mg into the nose as needed  (seizure rescue). At onset of seizure, may repeat once based on response and tolerability after >=4 hours. Maximum dose: Two doses per episode. Do not use for more than 1 episode every 5 days or more than 5 episodes per month. 02/13/24   Whitfield Raisin, NP  DULoxetine  (CYMBALTA ) 30 MG capsule TAKE 1 CAPSULE BY MOUTH EVERY DAY 10/22/23   Whitfield Raisin, NP  ELIQUIS  5 MG TABS tablet TAKE 1 TABLET BY MOUTH TWICE A DAY 12/23/23   Camnitz, Soyla Lunger, MD  KLOR-CON  M20 20 MEQ tablet Take 40 mEq by mouth 2 (two) times daily. 05/16/21   [provider]  levETIRAcetam  (KEPPRA ) 750 MG tablet Take 1 tablet (750 mg total) by mouth 2 (two) times daily. 02/13/24   Whitfield Raisin, NP  Multiple Vitamin (MULTIVITAMIN WITH MINERALS) TABS tablet Take 1 tablet by mouth daily.    [provider]  NURTEC 75 MG TBDP TAKE 1 TABLET (75 MG TOTAL) BY MOUTH AS NEEDED. 09/16/23   Whitfield Raisin, NP  pantoprazole  (PROTONIX ) 40 MG tablet Take 40 mg by mouth daily.    [provider]  potassium chloride  SA (KLOR-CON  M) 20 MEQ tablet Take 1 tablet (20 mEq total) by mouth daily. Take 2 tabs (40mEq) daily for 4 days.  Then take 1 tab (20 mEq) daily. 02/17/24   [name], MD  topiramate  (TOPAMAX ) 100 MG tablet Take 1 tablet (100 mg total) by mouth 2 (two) times daily. Take 1 tablet twice daily 02/13/24   Whitfield Raisin, NP  ursodiol  (ACTIGALL ) 250 MG tablet Take 250 mg by mouth 2 (two) times daily. 07/28/20   [provider]  Vitamin D , Ergocalciferol , (DRISDOL ) 1.25 MG (50000 UNIT) CAPS capsule Take 1 capsule (50,000 Units total) by mouth every 7 (seven) days. 03/28/22   [name], MD    Past Medical History:  Diagnosis Date   Acute ischemic left MCA stroke (HCC) 06/30/2020   Acute ischemic stroke (HCC)    Acute stroke due to occlusion of left middle cerebral artery (HCC) 06/30/2020   Age-related nuclear cataract of both eyes 09/28/2015   Arthralgia of multiple joints 09/28/2015   Asthma     Borderline diabetes    Cerebrovascular accident (CVA) (HCC)    Chronic bilateral thoracic back pain 06/05/2018   Chronic renal insufficiency, stage 1 08/11/2014   Diabetes mellitus type 2 in obese    Dyslipidemia    Dysphagia, post-stroke    Erosive gastritis 07/20/2019   Formatting of this note might be different from the original. On EGD 07/2019   Esophagitis 07/20/2019   Formatting of this note might be different from the original. Added automatically from request for surgery [id number]  Formatting of this note might be different from the original. On EGD 07/2019   Essential hypertension 09/28/2015   Essential thrombocytosis (HCC) 09/28/2015   Gallstones 08/18/2019   Formatting of this note might be different from the original. 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   Hyperlipidemia, unspecified 03/16/2013   Hypertension    Keratoconjunctivitis sicca of both eyes not specified as  Sjogren's 09/28/2015   Left middle cerebral artery stroke (HCC) 07/09/2020   Morbid obesity (HCC) 05/21/2013   Seizures (HCC)    Stage 3b chronic kidney disease (HCC)    Status post gastric bypass for obesity 09/28/2015   Past Surgical History:  Procedure Laterality Date   ANKLE SURGERY     CARPAL TUNNEL RELEASE     carpel tunnel     ESOPHAGOGASTRODUODENOSCOPY (EGD) WITH PROPOFOL  N/A 11/14/2021   Procedure: ESOPHAGOGASTRODUODENOSCOPY (EGD) WITH PROPOFOL ;  Surgeon: Avram Lupita BRAVO, MD;  Location: [facility] ENDOSCOPY;  Service: Gastroenterology;  Laterality: N/A;   IR ANGIO INTRA EXTRACRAN SEL COM CAROTID INNOMINATE BILAT MOD SED  01/17/2021   IR ANGIO VERTEBRAL SEL SUBCLAVIAN INNOMINATE UNI R MOD SED  01/17/2021   IR CT HEAD LTD  06/30/2020   IR INTRA CRAN STENT  06/30/2020   IR PERCUTANEOUS ART THROMBECTOMY/INFUSION INTRACRANIAL INC DIAG ANGIO  06/30/2020   IR RADIOLOGIST EVAL & MGMT  08/26/2020    IR US  GUIDE VASC ACCESS RIGHT  01/17/2021   RADIOLOGY WITH ANESTHESIA N/A 06/30/2020   Procedure: RADIOLOGY WITH ANESTHESIA;  Surgeon: Radiologist, Medication, MD;  Location: [facility] OR;  Service: Radiology;  Laterality: N/A;   Social History:  reports that she has never smoked. She has never used smokeless tobacco. She reports that she does not drink alcohol and does not use drugs. Allergies  Allergen Reactions   Homatropine Itching   Iodinated Contrast Media Itching and Other (See Comments)   Doxycycline Nausea And Vomiting and Other (See Comments)   Sulfa Antibiotics Rash and Hives   Codeine Hives and Swelling    Swollen tongue   Hydrocodone Itching   Hydrocodone Bit-Homatrop Mbr Itching   Sulfamethoxazole Hives   Ace Inhibitors Cough, Itching and Rash   Family History  Problem Relation Age of Onset   Stroke Maternal Grandfather    Physical Exam: Vitals:   02/25/24 1834 02/25/24 1835 02/25/24 1837 02/25/24 1845  BP: 111/75 99/71 (!) 89/69 103/61  Pulse: 69 71 77 69  Resp: 13   15  Temp:      TempSrc:      SpO2: 100%   100%   Alert awake and oriented x 3. Chronic expressive aphasia. Bilateral equal strength upper extremity. Occasionally have difficulty following commands and requires repetition. Pupils are equal and reactive to light. S1-S2 present. Bowel sound present Nontender. No edema.  Data Reviewed: I have reviewed ED notes, Vitals, Lab results and outpatient records. Since last encounter, pertinent lab results CBC and BMP   . I have ordered test including CBC and BMP  . I have discussed pt's care plan and test results with neurology and ED provider  .   Family Communication: Daughter at bedside.  Author: Yetta Blanch, MD 02/25/2024 7:15 PM For on call review [email or web address].

## 2024-02-25 NOTE — ED Notes (Signed)
 CCMD called.

## 2024-02-25 NOTE — Consult Note (Signed)
 NEUROLOGY CONSULT NOTE   Date of service: February 25, 2024 Patient Name: Courtney Burke MRN:  [id number] DOB:  [birth year] Chief Complaint: seizure Requesting Provider: Patt Alm Macho, MD  History of Present Illness  Courtney Burke is a 68 y.o. 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 She was BIB family after what they described as a staring-off spell. She then went unresponsive in a seated position, was moved to her bed by family, EMS called. Per EMS, patient was responsive to verbal stimuli with confusion, appearing postictal. Neurology consulted for concern for seizures.   On exam, patient is awake, confused to time and place (which daughter at bedside states is not normal for her), she perseverates on her incorrect answers, follows simple commands, no weakness or sensory deficits seen. Daughter is also concerned that she may have had another seizure since being here as she had another staring off spell, but was responsive afterwards.    Daughter states patient is compliant with her medication on my assessment. However, she did inform RN that patient does not have the valtoco  nasal spray that was prescribed for her by outpatient [facility]ue at [facility] on 02/13/2024 for seizure rescue.    ROS   Unable to ascertain due to AMS  Past History   Past Medical History:  Diagnosis Date   Acute ischemic left MCA stroke (HCC) 06/30/2020   Acute ischemic stroke (HCC)    Acute stroke due to occlusion of left middle cerebral artery (HCC) 06/30/2020   Age-related nuclear cataract of both eyes 09/28/2015   Arthralgia of multiple joints 09/28/2015   Asthma    Borderline diabetes    Cerebrovascular accident (CVA) (HCC)    Chronic bilateral thoracic back pain 06/05/2018   Chronic renal insufficiency, stage 1 08/11/2014   Diabetes mellitus type 2 in  obese    Dyslipidemia    Dysphagia, post-stroke    Erosive gastritis 07/20/2019   Formatting of this note might be different from the original. On EGD 07/2019   Esophagitis 07/20/2019   Formatting of this note might be different from the original. Added automatically from request for surgery [id number]  Formatting of this note might be different from the original. On EGD 07/2019   Essential hypertension 09/28/2015   Essential thrombocytosis (HCC) 09/28/2015   Gallstones 08/18/2019   Formatting of this note might be different from the original. 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   Morbid obesity (HCC) 05/21/2013   Seizures (HCC)    Stage 3b chronic kidney disease (HCC)    Status post gastric bypass for obesity 09/28/2015    Past Surgical History:  Procedure Laterality Date   ANKLE SURGERY     CARPAL TUNNEL RELEASE     carpel tunnel     ESOPHAGOGASTRODUODENOSCOPY (EGD) WITH PROPOFOL  N/A 11/14/2021   Procedure: ESOPHAGOGASTRODUODENOSCOPY (EGD) WITH PROPOFOL ;  Surgeon: Avram Lupita BRAVO, MD;  Location: [facility] ENDOSCOPY;  Service: Gastroenterology;  Laterality: N/A;   IR ANGIO INTRA EXTRACRAN SEL COM CAROTID INNOMINATE BILAT MOD SED  01/17/2021   IR ANGIO VERTEBRAL SEL SUBCLAVIAN INNOMINATE UNI R MOD SED  01/17/2021   IR CT HEAD LTD  06/30/2020   IR INTRA CRAN STENT  06/30/2020   IR PERCUTANEOUS ART THROMBECTOMY/INFUSION INTRACRANIAL INC DIAG ANGIO  06/30/2020   IR RADIOLOGIST EVAL & MGMT  08/26/2020   IR US  GUIDE VASC ACCESS RIGHT  01/17/2021   RADIOLOGY WITH ANESTHESIA N/A 06/30/2020   Procedure: RADIOLOGY WITH ANESTHESIA;  Surgeon: Radiologist, Medication, MD;  Location: [facility] OR;  Service:  Radiology;  Laterality: N/A;    Family History: Family History  Problem Relation Age of Onset   Stroke Maternal Grandfather     Social History  reports that she has never smoked. She has never used smokeless tobacco. She reports that she does not drink alcohol and does not use drugs.  Allergies  Allergen Reactions   Homatropine Itching   Iodinated Contrast Media Itching and Other (See Comments)   Doxycycline Nausea And Vomiting and Other (See Comments)   Sulfa Antibiotics Rash and Hives   Codeine Hives and Swelling    Swollen tongue   Hydrocodone Itching   Hydrocodone Bit-Homatrop Mbr Itching   Sulfamethoxazole Hives   Ace Inhibitors Cough, Itching and Rash    Medications  No current facility-administered medications for this encounter.  Current Outpatient Medications:    acetaminophen  (TYLENOL ) 500 MG tablet, Take 1,000 mg by mouth every 6 (six) hours as needed for moderate pain or headache., Disp: , Rfl:    amitriptyline  (ELAVIL ) 50 MG tablet, Take 1 tablet (50 mg total) by mouth at bedtime., Disp: 90 tablet, Rfl: 3   amLODipine  (NORVASC ) 5 MG tablet, Take 1 tablet (5 mg total) by mouth daily., Disp: 90 tablet, Rfl: 3   aspirin  EC 81 MG tablet, Take 1 tablet (81 mg total) by mouth daily. Swallow whole., Disp: , Rfl:    atorvastatin  (LIPITOR ) 80 MG tablet, Take 1 tablet (80 mg total) by mouth daily., Disp: 90 tablet, Rfl: 3   cholecalciferol  (VITAMIN D3) 25 MCG (1000 UNIT) tablet, Take by mouth., Disp: , Rfl:    diazePAM , 15 MG Dose, (VALTOCO  15 MG DOSE) 2 x 7.5 MG/0.1ML LQPK, Place 15 mg into the nose as needed (seizure rescue). At onset of seizure, may repeat once based on response and tolerability after >=4 hours. Maximum dose: Two doses per episode. Do not use for more than 1 episode every 5 days or more than 5 episodes per month., Disp: 5 each, Rfl: 5   DULoxetine  (CYMBALTA ) 30 MG capsule, TAKE 1 CAPSULE BY MOUTH EVERY DAY, Disp: 90 capsule, Rfl: 4   ELIQUIS  5 MG TABS  tablet, TAKE 1 TABLET BY MOUTH TWICE A DAY, Disp: 60 tablet, Rfl: 5   KLOR-CON  M20 20 MEQ tablet, Take 40 mEq by mouth 2 (two) times daily., Disp: , Rfl:    levETIRAcetam  (KEPPRA ) 750 MG tablet, Take 1 tablet (750 mg total) by mouth 2 (two) times daily., Disp: 180 tablet, Rfl: 3   Multiple Vitamin (MULTIVITAMIN WITH MINERALS) TABS tablet, Take 1 tablet by mouth daily., Disp: , Rfl:    NURTEC 75 MG TBDP, TAKE 1 TABLET (75 MG TOTAL) BY MOUTH AS NEEDED., Disp: 16 tablet, Rfl: 5   omeprazole (PRILOSEC) 40 MG capsule, Take 1 tablet by mouth daily., Disp: , Rfl:    pantoprazole  (PROTONIX ) 40 MG tablet, Take 40 mg by mouth daily., Disp: , Rfl:    potassium chloride  SA (KLOR-CON  M) 20 MEQ tablet, Take 1 tablet (20 mEq total) by mouth daily. Take 2 tabs (40mEq) daily for 4 days.  Then take  1 tab (20 mEq) daily., Disp: 90 tablet, Rfl: 3   topiramate  (TOPAMAX ) 100 MG tablet, Take 1 tablet (100 mg total) by mouth 2 (two) times daily. Take 1 tablet twice daily, Disp: 180 tablet, Rfl: 3   ursodiol  (ACTIGALL ) 250 MG tablet, Take 250 mg by mouth 2 (two) times daily., Disp: , Rfl:    Vitamin D , Ergocalciferol , (DRISDOL ) 1.25 MG (50000 UNIT) CAPS capsule, Take 1 capsule (50,000 Units total) by mouth every 7 (seven) days., Disp: 12 capsule, Rfl: 0  Vitals   Vitals:   2024/03/02 1224 2024/03/02 1600  BP: (!) 105/54 98/65  Pulse: 68 62  Resp: 18 13  Temp: 97.9 F (36.6 C) 98.3 F (36.8 C)  TempSrc: Oral Oral  SpO2: 100% 100%    There is no height or weight on file to calculate BMI.   Physical Exam   Constitutional: Appears well-developed and well-nourished.  Cardiovascular: Normal rate and regular rhythm.  Respiratory: Effort normal, non-labored breathing.   Neurologic Examination   Neuro: Mental Status: Patient is awake, alert, oriented to person only. She is confused to time and place.  She perseverates on her answers She has difficult naming objects as she continues with the perseveration when  asked to name them.  Poor attention and concentration.  Cranial Nerves: II: Visual Fields are full. Pupils are equal, round, and reactive to light.   III,IV, VI: EOMI without ptosis or diploplia.  V: Facial sensation is symmetric to light touch  VII: Facial movement is symmetric.  VIII: hearing is intact to voice X: Uvula elevates symmetrically XI: Shoulder shrug is symmetric. XII: tongue is midline without atrophy or fasciculations.  Motor: Tone is normal. Bulk is normal. 5/5 strength was present in all four extremities.  No drifts noted.  Sensory: Sensation is symmetric to light touch in the arms and legs. Cerebellar: FNF intact bilaterally   Labs/Imaging/Neurodiagnostic studies   CBC:  Recent Labs  Lab 03-02-24 1225  WBC 4.8  NEUTROABS 2.8  HGB 11.2*  HCT 34.7*  MCV 85.3  PLT 310   Basic Metabolic Panel:  Lab Results  Component Value Date   NA 139 03-02-2024   K 4.3 March 02, 2024   CO2 17 (L) 03/02/24   GLUCOSE 87 03/02/2024   BUN 17 03-02-24   CREATININE 1.97 (H) 2024/03/02   CALCIUM  8.5 (L) 2024-03-02   GFRNONAA 27 (L) 03-02-2024   GFRAA 39 (L) 07/08/2019   Lipid Panel:  Lab Results  Component Value Date   LDLCALC 26 08/23/2022   HgbA1c:  Lab Results  Component Value Date   HGBA1C 4.8 02/14/2024   Urine Drug Screen:     Component Value Date/Time   LABOPIA NONE DETECTED 06/30/2020 0825   COCAINSCRNUR NONE DETECTED 06/30/2020 0825   LABBENZ NONE DETECTED 06/30/2020 0825   AMPHETMU NONE DETECTED 06/30/2020 0825   THCU NONE DETECTED 06/30/2020 0825   LABBARB NONE DETECTED 06/30/2020 0825    Alcohol Level No results found for: [facility] INR  Lab Results  Component Value Date   INR 1.7 (H) 11/25/2023   APTT  Lab Results  Component Value Date   APTT 33 11/15/2021   AED levels:  Lab Results  Component Value Date   LEVETIRACETA 36.6 02/13/2024    CT Head without contrast(Personally reviewed): No acute intracranial abnormality  Sequelae of  prior left MCA territory infarct again noted.  Metal stent present in the left middle cerebral artery.   MRI Brain: ordered  Neurodiagnostics LTM EEG:  ordered  ASSESSMENT   Lark Langenfeld is a 68 y.o. 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 She was BIB family after what they described as a staring-off spell, unresponsive after. Per EMS, patient was responsive to verbal stimuli with confusion, appearing postictal.  On exam, patient is confused (more so than usual per daughter at bedside) with poor attention and perseveration.   No signs of infection or metabolic derangement on labs so far.   Impression: Breakthrough seizure in patient with known seizure disorder  RECOMMENDATIONS   - MRI brain - Seizure Precautions - Keppra  level - LTM EEG for spell characterization - Continue PTA AED medications  Keppra  75mg  BID  Topiramate  100mg  BID ______________________________________________________________________  Signed, Rocky JAYSON Likes, NP [facility]   I have seen the patient reviewed the above note.  The patient has baseline aphasia and currently appears close to that baseline.  She has had multiple episodes including two today, however given the description by the ER physician, as well as the fact that the daughter tells me that she can sometimes talk her down when I start to notice her going into one I do think there is some concern as far as etiology of these.  She is at very high risk of seizures, and if we are unable to characterize these on EEG, then I would favor increasing her medications but for now I would favor trying to capture one of these episodes with long-term EEG for spell characterization.  Aisha Seals, MD [facility]   If 7pm- 7am, please page neurology on call as listed in AMION.

## 2024-02-25 NOTE — Telephone Encounter (Signed)
 [name] has called indicating that a PA is needed for the diazePAM , 15 MG Dose, (VALTOCO  15 MG DOSE) 2 x 7.5 MG/0.1ML LQPK

## 2024-02-25 NOTE — ED Notes (Signed)
 Daughter at bedside, pt w/o complaints at this time. Continuous EEG monitoring in place.

## 2024-02-25 NOTE — ED Notes (Signed)
 Attempted to call CCMD but was placed on hold for greater than 5 minutes.

## 2024-02-25 NOTE — Progress Notes (Signed)
 Patient moved from rm 22 to 36 by ER staff. Study up and running. LTM maint complete - no skin breakdown seen Fp1 Fp2 Fz; serviced Fz   Not monitored by [facility] while in ER. MRI compatible leads in use.

## 2024-02-25 NOTE — Telephone Encounter (Signed)
 Pharmacy Patient Advocate Encounter  Received notification from [employer] that Prior Authorization for VALTOCO  has been APPROVED from 02/25/2024 to 06/03/2024. Ran test claim, Copay is $65.00. This test claim was processed through [facility]- copay amounts may vary at other pharmacies due to pharmacy/plan contracts, or as the patient moves through the different stages of their insurance plan.   PA #/Case ID/Reference #: [id number]

## 2024-02-25 NOTE — Telephone Encounter (Signed)
 [name] has called to report seizure activity today, she is unsure if pt had 2 back to back or if 1 was a seizure and another episode was a stroke.  Daughter reports the entire ordeal lasted about 20 mins.  Pt is on the way to ED, daughter is driving there, she would like either a call or my chart message to discuss the diazePAM , 15 MG Dose, (VALTOCO  15 MG DOSE) 2 x 7.5 MG/0.1ML LQPK

## 2024-02-25 NOTE — ED Provider Notes (Signed)
 [place] EMERGENCY DEPARTMENT AT [facility] Provider Note   CSN: [id number] Arrival date & time: 02/25/24  1206     Patient presents with: Seizures and Loss of Consciousness   Courtney Burke is a 68 y.o. female.   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   Per daughter: Today episode did appear like prior In total 20 minutes of being not herself About 5 minutes unresponsive Usually she comes back around more quickly  Does seem to be more at her baseline now  Keppra , no changes, has not missed doses No other medication changes No recent illness, no fever, no cough, no urinary symptoms. Some diarrhea.   Past Medical History:  Diagnosis Date   Acute ischemic left MCA stroke (HCC) 06/30/2020   Acute ischemic stroke (HCC)    Acute stroke due to occlusion of left middle cerebral artery (HCC) 06/30/2020   Age-related nuclear cataract of both eyes 09/28/2015   Arthralgia of multiple joints 09/28/2015   Asthma    Borderline diabetes    Cerebrovascular accident (CVA) (HCC)    Chronic bilateral thoracic back pain 06/05/2018   Chronic renal insufficiency, stage 1 08/11/2014   Diabetes mellitus type 2 in obese    Dyslipidemia    Dysphagia, post-stroke    Erosive gastritis 07/20/2019   Formatting of this note might be different from the original. On EGD 07/2019   Esophagitis 07/20/2019   Formatting of this note might be different from the original. Added automatically from request for surgery [id number]  Formatting of this note might be different from the original. On EGD 07/2019   Essential hypertension 09/28/2015   Essential thrombocytosis (HCC)  09/28/2015   Gallstones 08/18/2019   Formatting of this note might be different from the original. 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 Authorizing Provider  acetaminophen  (TYLENOL ) 500 MG tablet Take 1,000 mg by mouth every 6 (six) hours as needed for moderate pain or headache.   Yes [provider]  amitriptyline  (ELAVIL ) 50 MG tablet Take 1 tablet (50 mg total) by mouth at bedtime. 02/13/24  Yes McCue, Harlene, NP  amLODipine  (NORVASC ) 10 MG tablet Take 10 mg by mouth daily. 02/20/24  Yes [provider]  aspirin  EC 81 MG tablet Take 1 tablet (81 mg total) by mouth daily. Swallow whole. 06/29/21  Yes McCue, Harlene, NP  atorvastatin  (LIPITOR ) 80 MG tablet Take 1 tablet (80 mg total) by mouth daily. 08/14/23  Yes [name], MD  DULoxetine  (CYMBALTA ) 30 MG capsule TAKE 1 CAPSULE BY MOUTH EVERY DAY 10/22/23  Yes McCue, Harlene, NP  ELIQUIS  5 MG TABS tablet TAKE 1 TABLET BY MOUTH TWICE A DAY 12/23/23  Yes Camnitz, Soyla Lunger, MD  levETIRAcetam  (KEPPRA ) 750 MG tablet Take 1 tablet (750 mg total) by mouth 2 (two) times daily. 02/13/24  Yes McCue, Jessica, NP  NURTEC 75 MG TBDP TAKE 1 TABLET (75 MG TOTAL) BY MOUTH AS NEEDED. 09/16/23  Yes McCue, Harlene, NP  potassium chloride  SA (KLOR-CON  M) 20 MEQ tablet Take 1 tablet (20 mEq total) by mouth daily. Take 2 tabs (40mEq) daily for 4 days.  Then take 1 tab (20 mEq) daily. Patient taking  differently: Take 20 mEq by mouth daily. 02/17/24  Yes [name], MD  topiramate  (TOPAMAX ) 100 MG tablet Take 1 tablet (100 mg total) by mouth 2 (two) times daily. Take 1 tablet twice daily 02/13/24  Yes McCue, Harlene, NP  ursodiol  (ACTIGALL ) 250 MG tablet Take 250 mg by mouth 2 (two) times daily. 07/28/20  Yes [provider]  amLODipine  (NORVASC ) 5 MG tablet Take 1 tablet (5 mg total) by mouth daily. Patient not taking: Reported on 02/25/2024 02/14/24   [name], MD  diazePAM , 15 MG Dose, (VALTOCO  15 MG DOSE) 2 x 7.5 MG/0.1ML LQPK Place 15 mg into the nose as needed (seizure rescue). At onset of seizure, may repeat once based on response and tolerability after >=4 hours. Maximum dose: Two doses per episode. Do not use for more than 1 episode every 5 days or more than 5 episodes per month. Patient not taking: Reported on 02/25/2024 02/13/24   Whitfield Harlene, NP    Allergies: Homatropine, Iodinated contrast media, Doxycycline, Sulfa antibiotics, Codeine, Hydrocodone, Hydrocodone bit-homatrop mbr, Sulfamethoxazole, and Ace inhibitors    Review of Systems  Updated Vital Signs BP 96/64   Pulse 82   Temp 97.8 F (36.6 C) (Temporal)   Resp 11   LMP 09/28/2012   SpO2 97%   Physical Exam Vitals and nursing note reviewed.  Constitutional:      General: She is not in acute distress.    Appearance: She is well-developed. She is not diaphoretic.  HENT:     Head: Normocephalic and atraumatic.  Eyes:     Conjunctiva/sclera: Conjunctivae normal.  Cardiovascular:     Rate and Rhythm: Normal rate and regular rhythm.     Heart sounds: Normal heart sounds. No murmur heard.    No friction rub. No gallop.  Pulmonary:     Effort: Pulmonary effort is normal. No respiratory distress.     Breath sounds: Normal breath sounds. No wheezing or rales.  Abdominal:     General: There is no distension.     Palpations: Abdomen is soft.     Tenderness: There is no abdominal tenderness. There  is no guarding.  Musculoskeletal:        General: No tenderness.     Cervical back: Normal range of motion.  Skin:    General: Skin is warm and dry.     Findings: No erythema or rash.  Neurological:     Mental Status: She is alert.     Cranial Nerves: No cranial nerve deficit.     Sensory: No sensory deficit.     Motor: No weakness.     Comments: Has aphasia, daughter reports baseline at this time      (all labs ordered are listed, but only abnormal results are displayed) Labs Reviewed  CBC WITH DIFFERENTIAL/PLATELET - Abnormal; Notable for the following components:      Result Value   Hemoglobin 11.2 (*)    HCT 34.7 (*)    All other components within normal limits  COMPREHENSIVE METABOLIC PANEL WITH GFR - Abnormal; Notable for the following components:   Chloride 113 (*)    CO2 17 (*)    Creatinine, Ser 1.97 (*)    Calcium  8.5 (*)    Total Protein 6.0 (*)    Albumin 3.1 (*)    Alkaline Phosphatase 142 (*)    GFR, Estimated 27 (*)    All other components within normal limits  URINALYSIS, W/ REFLEX TO CULTURE (INFECTION SUSPECTED) - Abnormal; Notable for the following components:   Leukocytes,Ua MODERATE (*)    Bacteria, UA RARE (*)    All other components within normal limits  HEMOGLOBIN A1C - Abnormal; Notable for the following components:   Hgb A1c MFr Bld 4.7 (*)    All other components within normal limits  BASIC METABOLIC PANEL WITH GFR - Abnormal; Notable for the following components:   Chloride 114 (*)    CO2 18 (*)    Creatinine, Ser 1.85 (*)    Calcium  8.6 (*)    GFR, Estimated 29 (*)    All other components within normal limits  CBC - Abnormal; Notable for the following components:   Hemoglobin 10.7 (*)    HCT 32.8 (*)    All other components within normal limits  LEVETIRACETAM  LEVEL  HIV ANTIBODY (ROUTINE TESTING W REFLEX)    EKG: EKG Interpretation Date/Time:  Tuesday February 25 2024 12:20:49 EDT Ventricular Rate:  71 PR Interval:  190 QRS  Duration:  94 QT Interval:  386 QTC Calculation: 420 R Axis:   9  Text Interpretation: Sinus rhythm Low voltage, precordial leads No significant change since last tracing Confirmed by Patt Alm DEL [phone]) on 02/25/2024 3:32:08 PM  Radiology: Overnight EEG with video Result Date: 02/26/2024 Shelton Arlin KIDD, MD     02/26/2024  9:00 AM Patient Name: Erminie Foulks MRN: [id number] Epilepsy Attending: Arlin KIDD Shelton Referring Physician/Provider: Judithe Rocky BROCKS, NP Duration: 01/25/2024 1656 to 01/26/2024 0900 Patient history: 68 y.o. 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 She was BIB family after what they described as a staring-off spell. She then went unresponsive in a seated position, was moved to her bed by family, EMS called. Per EMS, patient was responsive to verbal stimuli with confusion, appearing postictal. EEG to evaluate for seizure Level of alertness: Awake, asleep AEDs during EEG study: LEV, TPM Technical aspects: This EEG study was done with scalp electrodes positioned according to the 10-20 International system of electrode placement. Electrical activity was reviewed with band pass filter of 1-70Hz , sensitivity of 7 uV/mm, display speed of 42mm/sec with a 60Hz  notched filter applied as appropriate. EEG data were recorded continuously and digitally stored.  Video monitoring was available and reviewed as appropriate. Description: The posterior dominant rhythm consists of 7.5 Hz activity of moderate voltage (25-35 uV) seen predominantly in posterior head regions, symmetric and reactive to eye opening and eye closing. Sleep was characterized by vertex waves, sleep spindles (12 to 14 Hz), maximal frontocentral region.  There is intermittent generalized 3 to 6 Hz theta-delta slowing. Hyperventilation and photic stimulation were not performed.   EEG was  disconnected between 02/25/2024 1814 to 1849 for unclear reasons. ABNORMALITY - Intermittent slow, generalized IMPRESSION: This study is suggestive of mild diffuse encephalopathy. No seizures or epileptiform discharges were seen throughout the recording. Priyanka O Yadav   CT Head  Wo Contrast Result Date: 02/25/2024 CLINICAL DATA:  Seizure disorder EXAM: CT HEAD WITHOUT CONTRAST TECHNIQUE: Contiguous axial images were obtained from the base of the skull through the vertex without intravenous contrast. RADIATION DOSE REDUCTION: This exam was performed according to the departmental dose-optimization program which includes automated exposure control, adjustment of the mA and/or kV according to patient size and/or use of iterative reconstruction technique. COMPARISON:  Most recent prior CT scan of the head 07/12/2023 FINDINGS: Brain: No evidence of acute infarction, hemorrhage, hydrocephalus, extra-axial collection or mass lesion/mass effect. Stable encephalomalacia in the left frontal, temporal and parietal lobes extending into the left basal ganglia consistent with remote prior ischemic insult. Vascular: Metal stent present within the left middle cerebral artery. Skull: Normal. Negative for fracture or focal lesion. Sinuses/Orbits: No acute finding. Other: None. IMPRESSION: 1. No acute intracranial abnormality. 2. Sequelae of prior left MCA territory infarct again noted. 3. Metal stent present in the left middle cerebral artery. Electronically Signed   By: Wilkie Lent M.D.   On: 02/25/2024 14:53     Procedures   Medications Ordered in the ED  amitriptyline  (ELAVIL ) tablet 50 mg (50 mg Oral Given 02/25/24 2138)  aspirin  EC tablet 81 mg (81 mg Oral Not Given 02/26/24 0911)  atorvastatin  (LIPITOR ) tablet 80 mg (80 mg Oral Not Given 02/26/24 0911)  DULoxetine  (CYMBALTA ) DR capsule 30 mg (30 mg Oral Not Given 02/26/24 0911)  apixaban  (ELIQUIS ) tablet 5 mg (5 mg Oral Not Given 02/26/24 0911)  topiramate   (TOPAMAX ) tablet 100 mg (100 mg Oral Not Given 02/26/24 0911)  ursodiol  (ACTIGALL ) capsule 300 mg (300 mg Oral Not Given 02/26/24 0912)  0.9 %  sodium chloride  infusion ( Intravenous New Bag/Given 02/26/24 0711)  acetaminophen  (TYLENOL ) tablet 650 mg (has no administration in time range)    Or  acetaminophen  (TYLENOL ) suppository 650 mg (has no administration in time range)  ondansetron  (ZOFRAN ) tablet 4 mg (has no administration in time range)    Or  ondansetron  (ZOFRAN ) injection 4 mg (has no administration in time range)  polyethylene glycol (MIRALAX  / GLYCOLAX ) packet 17 g (has no administration in time range)  levETIRAcetam  (KEPPRA ) undiluted injection 750 mg (750 mg Intravenous Given 02/25/24 2139)  LORazepam  (ATIVAN ) injection 1 mg (has no administration in time range)  levETIRAcetam  (KEPPRA ) undiluted injection 1,000 mg (1,000 mg Intravenous Given 02/25/24 1319)                                    Medical Decision Making Amount and/or Complexity of Data Reviewed Labs: ordered. Radiology: ordered.  Risk Decision regarding hospitalization.    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   DDx includes seizure, syncope (with ddx for syncope including dehydration, vasovagal, sepsis, PE, tamponade, electrolyte abnormalities, anemia, arrhythmia).  Also reports dizziness with ddx including above as well as CVA, peripheral vertigo, ICH.   EKG evaluated by me shows normal sinus rhythm.  CT head without signs of ICH.  Labs evaluated by me with mildly decreased bicarb, Cr at baseline, no clinically significant anemia.  UA borderline.   No cp or dyspnea to suggest ACS, PE, tamponade, no  infectious symptoms to suggest pneumonia or sepsis.  No acute  medication changes.    Daughter reports episodes like this staring followed by increased aphasia and confusion are similar to prior seizures. Did have 2 episodes in the ED when she was not responding normally. One when her head looked up and did not respond-no shaking, no clear postictal state, then did respond to prompting. Another when she did not respond but was pushing away my hand with her right arm.  UNclear if these are nonepileptic seizure like behaviors or focal seizure. Have consulted Neurology for further evaluation. Signed out to Dr. Patt. She was given 1g keppra .       Final diagnoses:  Seizure [facility])    ED Discharge Orders     None          Dreama Longs, MD 02/26/24 [phone]

## 2024-02-25 NOTE — ED Notes (Signed)
 Pt ambulated with stand by assist to bathroom for a one time non volume urine and stool occurrence.   Upon return to room patient was taken to CT.

## 2024-02-25 NOTE — ED Provider Notes (Signed)
  Physical Exam  BP 98/65   Pulse 62   Temp 98.3 F (36.8 C) (Oral)   Resp 13   LMP 09/28/2012   SpO2 100%   Physical Exam  Procedures  Procedures  ED Course / MDM    Medical Decision Making Care assumed at 3 PM.  Patient is here with seizure-like activity.  Patient has a history of seizure and stroke.  Patient was given Keppra .  Neurology consulted and will see patient  5:42 PM Dr. Michaela saw patient.  He wants patient to be monitored overnight on continuous EEG.  He wants to cancel the MRI for now and monitor her.  He has low suspicion for stroke  Problems Addressed: Seizure [facility]): acute illness or injury  Amount and/or Complexity of Data Reviewed Labs: ordered. Decision-making details documented in ED Course. Radiology: ordered and independent interpretation performed. Decision-making details documented in ED Course.          Patt Alm Macho, MD 02/25/24 [phone]

## 2024-02-25 NOTE — Telephone Encounter (Signed)
 Spoke w/Pt daughter regarding seizure Pt had today. Pt in ED being evaluated. Daughter stated they are doing labs and may do a brain scan. Daughter stated Pt has been taking levetiracetam  but has been unable to attain the Valtoco  ordered for Pt as PA is required. Made daughter aware PA team has sent a request. Informed daughter will make NP aware of what occurred today. Pt daughter voiced understanding and thanks for the call back.

## 2024-02-25 NOTE — ED Notes (Signed)
 [facility] EMS bring patient from home.   Pt lives with brother who called 911. The pt was sitting in a chair and started to stare off - She then went unresponsive in a seated position - did not fall out of her chair or hit her head - No trauma. The brother moved her into her bed which is where EMS found her. Upon EMS meeting the patient she was responsive only to verbal stimuli with some confusion and appeared somewhat postictal according to EMS.   Hx of seizures. According to brother all other seizures have resembled this one. Pt is on keppra .   Hx stroke.   En route was AxOx2 disoriented to situation and time. Unknown baseline mentation.   No reports of recent illness.    EMS vitals: BP 110/66 HR 76 CBG 103 O2 99 on RA 18G  LAC

## 2024-02-25 NOTE — Telephone Encounter (Signed)
 Pharmacy Patient Advocate Encounter   Received notification from Physician's Office that prior authorization for Valtoco  is required/requested.   Insurance verification completed.   The patient is insured through [facility] .   Per test claim: PA required; PA submitted to above mentioned insurance via Latent Key/confirmation #/EOC [id number] Status is pending

## 2024-02-26 ENCOUNTER — Inpatient Hospital Stay (HOSPITAL_COMMUNITY)

## 2024-02-26 ENCOUNTER — Encounter (HOSPITAL_COMMUNITY): Payer: Self-pay | Admitting: Internal Medicine

## 2024-02-26 DIAGNOSIS — E785 Hyperlipidemia, unspecified: Secondary | ICD-10-CM | POA: Diagnosis present

## 2024-02-26 DIAGNOSIS — Z91041 Radiographic dye allergy status: Secondary | ICD-10-CM | POA: Diagnosis not present

## 2024-02-26 DIAGNOSIS — K219 Gastro-esophageal reflux disease without esophagitis: Secondary | ICD-10-CM | POA: Diagnosis present

## 2024-02-26 DIAGNOSIS — E1122 Type 2 diabetes mellitus with diabetic chronic kidney disease: Secondary | ICD-10-CM | POA: Diagnosis present

## 2024-02-26 DIAGNOSIS — Z7901 Long term (current) use of anticoagulants: Secondary | ICD-10-CM | POA: Diagnosis not present

## 2024-02-26 DIAGNOSIS — I480 Paroxysmal atrial fibrillation: Secondary | ICD-10-CM | POA: Diagnosis present

## 2024-02-26 DIAGNOSIS — I10 Essential (primary) hypertension: Secondary | ICD-10-CM | POA: Diagnosis not present

## 2024-02-26 DIAGNOSIS — Z8673 Personal history of transient ischemic attack (TIA), and cerebral infarction without residual deficits: Secondary | ICD-10-CM | POA: Diagnosis not present

## 2024-02-26 DIAGNOSIS — R569 Unspecified convulsions: Secondary | ICD-10-CM | POA: Diagnosis present

## 2024-02-26 DIAGNOSIS — E878 Other disorders of electrolyte and fluid balance, not elsewhere classified: Secondary | ICD-10-CM | POA: Diagnosis present

## 2024-02-26 DIAGNOSIS — Z882 Allergy status to sulfonamides status: Secondary | ICD-10-CM | POA: Diagnosis not present

## 2024-02-26 DIAGNOSIS — I69320 Aphasia following cerebral infarction: Secondary | ICD-10-CM | POA: Diagnosis not present

## 2024-02-26 DIAGNOSIS — G40909 Epilepsy, unspecified, not intractable, without status epilepticus: Secondary | ICD-10-CM | POA: Diagnosis present

## 2024-02-26 DIAGNOSIS — F39 Unspecified mood [affective] disorder: Secondary | ICD-10-CM | POA: Diagnosis present

## 2024-02-26 DIAGNOSIS — Z79899 Other long term (current) drug therapy: Secondary | ICD-10-CM | POA: Diagnosis not present

## 2024-02-26 DIAGNOSIS — E8729 Other acidosis: Secondary | ICD-10-CM | POA: Diagnosis present

## 2024-02-26 DIAGNOSIS — Z885 Allergy status to narcotic agent status: Secondary | ICD-10-CM | POA: Diagnosis not present

## 2024-02-26 DIAGNOSIS — Z7982 Long term (current) use of aspirin: Secondary | ICD-10-CM | POA: Diagnosis not present

## 2024-02-26 DIAGNOSIS — Z9884 Bariatric surgery status: Secondary | ICD-10-CM | POA: Diagnosis not present

## 2024-02-26 DIAGNOSIS — I951 Orthostatic hypotension: Secondary | ICD-10-CM | POA: Diagnosis present

## 2024-02-26 DIAGNOSIS — I129 Hypertensive chronic kidney disease with stage 1 through stage 4 chronic kidney disease, or unspecified chronic kidney disease: Secondary | ICD-10-CM | POA: Diagnosis present

## 2024-02-26 DIAGNOSIS — N1832 Chronic kidney disease, stage 3b: Secondary | ICD-10-CM | POA: Diagnosis present

## 2024-02-26 DIAGNOSIS — I69391 Dysphagia following cerebral infarction: Secondary | ICD-10-CM | POA: Diagnosis not present

## 2024-02-26 LAB — BASIC METABOLIC PANEL WITH GFR
Anion gap: 7 (ref 5–15)
BUN: 16 mg/dL (ref 8–23)
CO2: 18 mmol/L — ABNORMAL LOW (ref 22–32)
Calcium: 8.6 mg/dL — ABNORMAL LOW (ref 8.9–10.3)
Chloride: 114 mmol/L — ABNORMAL HIGH (ref 98–111)
Creatinine, Ser: 1.85 mg/dL — ABNORMAL HIGH (ref 0.44–1.00)
GFR, Estimated: 29 mL/min — ABNORMAL LOW (ref >60)
Glucose, Bld: 80 mg/dL (ref 70–99)
Potassium: 4.2 mmol/L (ref 3.5–5.1)
Sodium: 139 mmol/L (ref 135–145)

## 2024-02-26 LAB — CBC
HCT: 32.8 % — ABNORMAL LOW (ref 36.0–46.0)
Hemoglobin: 10.7 g/dL — ABNORMAL LOW (ref 12.0–15.0)
MCH: 27.6 pg (ref 26.0–34.0)
MCHC: 32.6 g/dL (ref 30.0–36.0)
MCV: 84.5 fL (ref 80.0–100.0)
Platelets: 295 K/uL (ref 150–400)
RBC: 3.88 MIL/uL (ref 3.87–5.11)
RDW: 14.1 % (ref 11.5–15.5)
WBC: 4.2 K/uL (ref 4.0–10.5)
nRBC: 0 % (ref 0.0–0.2)

## 2024-02-26 LAB — HEMOGLOBIN A1C
Hgb A1c MFr Bld: 4.7 % — ABNORMAL LOW (ref 4.8–5.6)
Mean Plasma Glucose: 88.19 mg/dL

## 2024-02-26 LAB — TSH: TSH: 1.775 u[IU]/mL (ref 0.350–4.500)

## 2024-02-26 LAB — HIV ANTIBODY (ROUTINE TESTING W REFLEX): HIV Screen 4th Generation wRfx: NONREACTIVE

## 2024-02-26 LAB — LEVETIRACETAM LEVEL: Levetiracetam Lvl: 54 ug/mL — ABNORMAL HIGH (ref 10.0–40.0)

## 2024-02-26 MED ORDER — MIDODRINE HCL 5 MG PO TABS
5.0000 mg | ORAL_TABLET | Freq: Once | ORAL | Status: AC
  Administered 2024-02-26: 5 mg via ORAL
  Filled 2024-02-26: qty 1

## 2024-02-26 NOTE — Plan of Care (Signed)
  Problem: Clinical Measurements: Goal: Will remain free from infection 02/26/2024 1733 by Waddell Colton BROCKS, RN Outcome: Progressing 02/26/2024 1732 by Waddell Colton BROCKS, RN Outcome: Progressing Goal: Respiratory complications will improve 02/26/2024 1733 by Waddell Colton BROCKS, RN Outcome: Progressing 02/26/2024 1732 by Waddell Colton BROCKS, RN Outcome: Progressing Goal: Cardiovascular complication will be avoided 02/26/2024 1733 by Waddell Colton BROCKS, RN Outcome: Progressing 02/26/2024 1732 by Waddell Colton BROCKS, RN Outcome: Progressing   Problem: Nutrition: Goal: Adequate nutrition will be maintained 02/26/2024 1733 by Waddell Colton BROCKS, RN Outcome: Progressing 02/26/2024 1732 by Waddell Colton BROCKS, RN Outcome: Progressing   Problem: Elimination: Goal: Will not experience complications related to bowel motility 02/26/2024 1733 by Waddell Colton BROCKS, RN Outcome: Progressing 02/26/2024 1732 by Waddell Colton BROCKS, RN Outcome: Progressing Goal: Will not experience complications related to urinary retention 02/26/2024 1733 by Waddell Colton BROCKS, RN Outcome: Progressing 02/26/2024 1732 by Waddell Colton BROCKS, RN Outcome: Progressing   Problem: Pain Managment: Goal: General experience of comfort will improve and/or be controlled 02/26/2024 1733 by Waddell Colton BROCKS, RN Outcome: Progressing 02/26/2024 1732 by Waddell Colton BROCKS, RN Outcome: Progressing   Problem: Safety: Goal: Ability to remain free from injury will improve 02/26/2024 1733 by Waddell Colton BROCKS, RN Outcome: Progressing 02/26/2024 1732 by Waddell Colton BROCKS, RN Outcome: Progressing   Problem: Skin Integrity: Goal: Risk for impaired skin integrity will decrease 02/26/2024 1733 by Waddell Colton BROCKS, RN Outcome: Progressing 02/26/2024 1732 by Waddell Colton BROCKS, RN Outcome: Progressing

## 2024-02-26 NOTE — Evaluation (Signed)
 Clinical/Bedside Swallow Evaluation Patient Details  Name: Courtney Burke MRN: [id number] Date of Birth: Feb 24, 1956  Today's Date: 02/26/2024 Time: SLP Start Time (ACUTE ONLY): 1534 SLP Stop Time (ACUTE ONLY): 1547 SLP Time Calculation (min) (ACUTE ONLY): 13 min  Past Medical History:  Past Medical History:  Diagnosis Date   Acute ischemic left MCA stroke (HCC) 06/30/2020   Acute ischemic stroke (HCC)    Acute stroke due to occlusion of left middle cerebral artery (HCC) 06/30/2020   Age-related nuclear cataract of both eyes 09/28/2015   Arthralgia of multiple joints 09/28/2015   Asthma    Borderline diabetes    Cerebrovascular accident (CVA) (HCC)    Chronic bilateral thoracic back pain 06/05/2018   Chronic renal insufficiency, stage 1 08/11/2014   Diabetes mellitus type 2 in obese    Dyslipidemia    Dysphagia, post-stroke    Erosive gastritis 07/20/2019   Formatting of this note might be different from the original. On EGD 07/2019   Esophagitis 07/20/2019   Formatting of this note might be different from the original. Added automatically from request for surgery [id number]  Formatting of this note might be different from the original. On EGD 07/2019   Essential hypertension 09/28/2015   Essential thrombocytosis (HCC) 09/28/2015   Gallstones 08/18/2019   Formatting of this note might be different from the original. 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   Morbid obesity (HCC) 05/21/2013   Seizures (HCC)    Stage 3b chronic kidney disease (HCC)    Status post gastric bypass for obesity 09/28/2015   Past Surgical History:  Past Surgical History:  Procedure Laterality  Date   ANKLE SURGERY     CARPAL TUNNEL RELEASE     carpel tunnel     ESOPHAGOGASTRODUODENOSCOPY (EGD) WITH PROPOFOL  N/A 11/14/2021   Procedure: ESOPHAGOGASTRODUODENOSCOPY (EGD) WITH PROPOFOL ;  Surgeon: Avram Lupita BRAVO, MD;  Location: [facility] ENDOSCOPY;  Service: Gastroenterology;  Laterality: N/A;   IR ANGIO INTRA EXTRACRAN SEL COM CAROTID INNOMINATE BILAT MOD SED  01/17/2021   IR ANGIO VERTEBRAL SEL SUBCLAVIAN INNOMINATE UNI R MOD SED  01/17/2021   IR CT HEAD LTD  06/30/2020   IR INTRA CRAN STENT  06/30/2020   IR PERCUTANEOUS ART THROMBECTOMY/INFUSION INTRACRANIAL INC DIAG ANGIO  06/30/2020   IR RADIOLOGIST EVAL & MGMT  08/26/2020   IR US  GUIDE VASC ACCESS RIGHT  01/17/2021   RADIOLOGY WITH ANESTHESIA N/A 06/30/2020   Procedure: RADIOLOGY WITH ANESTHESIA;  Surgeon: Radiologist, Medication, MD;  Location: [facility] OR;  Service: Radiology;  Laterality: N/A;   HPI:  Patient is a 68 yo female who presented to the ED with a seizure like episode (staring spells). CT of head: no acute events. Currently undergoing 24 hour EEG. Pt seen primarily for aphasia with one isolated swallow evaluation (05/2022) that was [facility]. 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 She fed herself without s/s of dysphagia or aspiration. Recommend resuming regular diet with thin liquids.  Based on most recent OP SLP notes, pt's receptive and expressive language appear to be at baseline and encephalopathy is not treatable by SLP. Pt and her family could consider ongoing OP SLP if desired but acute f/u is not warranted.  SLP Visit Diagnosis: Dysphagia, unspecified (R13.10)    Aspiration Risk  Mild aspiration risk    Diet  Recommendation Regular;Thin liquid    Liquid Administration via: Cup;Straw Medication Administration: Whole meds with liquid Supervision: Patient able to self feed;Intermittent supervision to cue for compensatory strategies Compensations: Slow rate;Small sips/bites Postural Changes: Seated upright at 90 degrees    Other  Recommendations Oral Care Recommendations: Oral care BID     Assistance Recommended at Discharge    Functional Status Assessment Patient has not had a recent decline in their functional status  Frequency and Duration            Prognosis Prognosis for improved oropharyngeal function: Good Barriers to Reach Goals: Language deficits      Swallow Study   General HPI: Patient is a 68 yo female who presented to the ED with a seizure like episode (staring spells). CT of head: no acute events. Currently undergoing 24 hour EEG. Pt seen primarily for aphasia with one isolated swallow evaluation (05/2022) that was [facility]. 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, HTN, seizure disorder, HLD, gastric bypass history, DM2, mood disorder, GERD, paroxysmal A-fib, ILR implant, CKD3B Type of Study: Bedside Swallow Evaluation Previous Swallow Assessment: see HPI Diet Prior to this Study: NPO Temperature Spikes Noted: No Respiratory Status: Room air History of Recent Intubation: No Behavior/Cognition: Alert;Cooperative Oral Cavity Assessment: Within Functional Limits Oral Care Completed by SLP: No Oral Cavity - Dentition: Adequate natural dentition Vision: Functional for self-feeding Self-Feeding Abilities: Able to feed self Patient Positioning: Upright in bed Baseline Vocal Quality: Normal Volitional Cough: Cognitively unable to elicit Volitional Swallow: Able to elicit    Oral/Motor/Sensory Function Overall Oral Motor/Sensory Function: Within functional limits   Ice Chips Ice chips: Not tested   Thin Liquid Thin Liquid: Within functional limits Presentation:  Straw;Self Fed    Nectar Thick Nectar Thick Liquid: Not tested   Honey Thick Honey Thick Liquid: Not tested   Puree Puree: Within functional limits Presentation: Spoon   Solid     Solid: Within functional limits Presentation: Self Fed      Damien Blumenthal, M.A., CCC-SLP Speech Language Pathology, Acute Rehabilitation Services  Secure Chat preferred [phone]  02/26/2024,4:01 PM

## 2024-02-26 NOTE — Progress Notes (Signed)
 PT Cancellation Note  Patient Details Name: Courtney Burke MRN: [id number] DOB: Dec 18, 1955   Cancelled Treatment:    Reason Eval/Treat Not Completed: Medical issues which prohibited therapy (OT recommended PT wait to evaluate when off continuous EEG. Will return tomorrow.)   Courtney Burke Courtney Burke 02/26/2024, 1:42 PM Courtney Burke M,PT Acute Rehab Services [phone]

## 2024-02-26 NOTE — Procedures (Addendum)
 Patient Name: Starkeisha Vanwinkle  MRN: [id number]  Epilepsy Attending: Arlin MALVA Krebs  Referring Physician/Provider: Judithe Rocky BROCKS, NP  Duration: 01/25/2024 1656 to 01/26/2024 1730  Patient history: 68 y.o. 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 She was BIB family after what they described as a staring-off spell. She then went unresponsive in a seated position, was moved to her bed by family, EMS called. Per EMS, patient was responsive to verbal stimuli with confusion, appearing postictal. EEG to evaluate for seizure  Level of alertness: Awake, asleep  AEDs during EEG study: LEV, TPM  Technical aspects: This EEG study was done with scalp electrodes positioned according to the 10-20 International system of electrode placement. Electrical activity was reviewed with band pass filter of 1-70Hz , sensitivity of 7 uV/mm, display speed of 26mm/sec with a 60Hz  notched filter applied as appropriate. EEG data were recorded continuously and digitally stored.  Video monitoring was available and reviewed as appropriate.  Description: The posterior dominant rhythm consists of 7.5 Hz activity of moderate voltage (25-35 uV) seen predominantly in posterior head regions, symmetric and reactive to eye opening and eye closing. Sleep was characterized by vertex waves, sleep spindles (12 to 14 Hz), maximal frontocentral region.  There is intermittent generalized 3 to 6 Hz theta-delta slowing. Hyperventilation and photic stimulation were not performed.     EEG was disconnected between 02/25/2024 1814 to 1849 for unclear reasons.  ABNORMALITY - Intermittent slow, generalized  IMPRESSION: This study is suggestive of mild diffuse encephalopathy. No seizures or epileptiform discharges were seen throughout the recording.  Laquasia Pincus O Jadin Kagel

## 2024-02-26 NOTE — Progress Notes (Signed)
 NEUROLOGY CONSULT FOLLOW UP NOTE   Date of service: February 26, 2024 Patient Name: Courtney Burke MRN:  [id number] DOB:  Sep 05, 1955  Interval Hx/subjective   No family at the bedside. Patient is asleep and awakens easily.   She is confused. She is able to state her name, stated age as 70 and perseverates on her name with further questioning. At times she has word salad  and tangential speech, can follow commands   Vitals   Vitals:   02/26/24 0415 02/26/24 0430 02/26/24 0445 02/26/24 0500  BP:  101/65  96/64  Pulse: 64 (!) 34 65 82  Resp: 14 11 11 11   Temp:    97.8 F (36.6 C)  TempSrc:    Temporal  SpO2: 100% 100% 98% 97%     There is no height or weight on file to calculate BMI.  Physical Exam   Constitutional: Appears well-developed and well-nourished.   Psych: Affect appropriate to situation.   Eyes: No scleral injection.   HENT: No OP obstrucion.   Head: Normocephalic.   Cardiovascular: Normal rate and regular rhythm.   Respiratory: Effort normal, non-labored breathing.   GI: Soft.  No distension. There is no tenderness.   Skin: WDI.    Neurologic Examination   Mental Status -  Confused. able to state her name, stated age as 57 and perseverates on her name with further questioning. At times she has word salad  and tangential speech, can follow commands   Cranial Nerves II - XII - II - Visual field intact OU. III, IV, VI - Extraocular movements intact. V - Facial sensation intact bilaterally. VII - Facial movement intact bilaterally. VIII - Hearing & vestibular intact bilaterally. X - Palate elevates symmetrically. XI - Chin turning & shoulder shrug intact bilaterally. XII - Tongue protrusion intact.  Motor Strength - The patient's strength was normal in all extremities and pronator drift was absent.  Bulk was normal and fasciculations were absent.   Motor Tone - Muscle tone was assessed at the neck and appendages and was normal. Sensory -  symmetric to light touch bilaterally  Coordination - FTN is intact bilaterally, unable to assess HTS  Gait and Station - deferred.  Medications  Current Facility-Administered Medications:    0.9 %  sodium chloride  infusion, , Intravenous, Continuous, Tobie Yetta HERO, MD, Last Rate: 100 mL/hr at 02/26/24 0711, New Bag at 02/26/24 0711   acetaminophen  (TYLENOL ) tablet 650 mg, 650 mg, Oral, Q6H PRN **OR** acetaminophen  (TYLENOL ) suppository 650 mg, 650 mg, Rectal, Q6H PRN, Tobie Yetta HERO, MD   amitriptyline  (ELAVIL ) tablet 50 mg, 50 mg, Oral, QHS, Patel, Pranav M, MD, 50 mg at 02/25/24 2138   apixaban  (ELIQUIS ) tablet 5 mg, 5 mg, Oral, BID, Patel, Pranav M, MD, 5 mg at 02/25/24 2139   aspirin  EC tablet 81 mg, 81 mg, Oral, Daily, Patel, Pranav M, MD   atorvastatin  (LIPITOR ) tablet 80 mg, 80 mg, Oral, Daily, Patel, Pranav M, MD   DULoxetine  (CYMBALTA ) DR capsule 30 mg, 30 mg, Oral, Daily, Tobie, Pranav M, MD   levETIRAcetam  (KEPPRA ) undiluted injection 750 mg, 750 mg, Intravenous, BID, Patel, Pranav M, MD, 750 mg at 02/25/24 2139   LORazepam  (ATIVAN ) injection 1 mg, 1 mg, Intravenous, Q4H PRN, Patel, Pranav M, MD   ondansetron  (ZOFRAN ) tablet 4 mg, 4 mg, Oral, Q6H PRN **OR** ondansetron  (ZOFRAN ) injection 4 mg, 4 mg, Intravenous, Q6H PRN, Patel, Pranav M, MD   polyethylene glycol (MIRALAX  / GLYCOLAX ) packet 17 g,  17 g, Oral, Daily PRN, Patel, Pranav M, MD   topiramate  (TOPAMAX ) tablet 100 mg, 100 mg, Oral, BID, Patel, Pranav M, MD, 100 mg at 02/25/24 2141   ursodiol  (ACTIGALL ) capsule 300 mg, 300 mg, Oral, BID, Patel, Pranav M, MD, 300 mg at 02/25/24 2139  Current Outpatient Medications:    acetaminophen  (TYLENOL ) 500 MG tablet, Take 1,000 mg by mouth every 6 (six) hours as needed for moderate pain or headache., Disp: , Rfl:    amitriptyline  (ELAVIL ) 50 MG tablet, Take 1 tablet (50 mg total) by mouth at bedtime., Disp: 90 tablet, Rfl: 3   amLODipine  (NORVASC ) 10 MG tablet, Take 10 mg by mouth  daily., Disp: , Rfl:    aspirin  EC 81 MG tablet, Take 1 tablet (81 mg total) by mouth daily. Swallow whole., Disp: , Rfl:    atorvastatin  (LIPITOR ) 80 MG tablet, Take 1 tablet (80 mg total) by mouth daily., Disp: 90 tablet, Rfl: 3   DULoxetine  (CYMBALTA ) 30 MG capsule, TAKE 1 CAPSULE BY MOUTH EVERY DAY, Disp: 90 capsule, Rfl: 4   ELIQUIS  5 MG TABS tablet, TAKE 1 TABLET BY MOUTH TWICE A DAY, Disp: 60 tablet, Rfl: 5   levETIRAcetam  (KEPPRA ) 750 MG tablet, Take 1 tablet (750 mg total) by mouth 2 (two) times daily., Disp: 180 tablet, Rfl: 3   NURTEC 75 MG TBDP, TAKE 1 TABLET (75 MG TOTAL) BY MOUTH AS NEEDED., Disp: 16 tablet, Rfl: 5   potassium chloride  SA (KLOR-CON  M) 20 MEQ tablet, Take 1 tablet (20 mEq total) by mouth daily. Take 2 tabs (40mEq) daily for 4 days.  Then take 1 tab (20 mEq) daily. (Patient taking differently: Take 20 mEq by mouth daily.), Disp: 90 tablet, Rfl: 3   topiramate  (TOPAMAX ) 100 MG tablet, Take 1 tablet (100 mg total) by mouth 2 (two) times daily. Take 1 tablet twice daily, Disp: 180 tablet, Rfl: 3   ursodiol  (ACTIGALL ) 250 MG tablet, Take 250 mg by mouth 2 (two) times daily., Disp: , Rfl:    amLODipine  (NORVASC ) 5 MG tablet, Take 1 tablet (5 mg total) by mouth daily. (Patient not taking: Reported on 02/25/2024), Disp: 90 tablet, Rfl: 3   diazePAM , 15 MG Dose, (VALTOCO  15 MG DOSE) 2 x 7.5 MG/0.1ML LQPK, Place 15 mg into the nose as needed (seizure rescue). At onset of seizure, may repeat once based on response and tolerability after >=4 hours. Maximum dose: Two doses per episode. Do not use for more than 1 episode every 5 days or more than 5 episodes per month. (Patient not taking: Reported on 02/25/2024), Disp: 5 each, Rfl: 5  Labs and Diagnostic Imaging   CBC:  Recent Labs  Lab 02/25/24 1225 02/26/24 0600  WBC 4.8 4.2  NEUTROABS 2.8  --   HGB 11.2* 10.7*  HCT 34.7* 32.8*  MCV 85.3 84.5  PLT 310 295    Basic Metabolic Panel:  Lab Results  Component Value Date   NA  139 02/26/2024   K 4.2 02/26/2024   CO2 18 (L) 02/26/2024   GLUCOSE 80 02/26/2024   BUN 16 02/26/2024   CREATININE 1.85 (H) 02/26/2024   CALCIUM  8.6 (L) 02/26/2024   GFRNONAA 29 (L) 02/26/2024   GFRAA 39 (L) 07/08/2019   Lipid Panel:  Lab Results  Component Value Date   LDLCALC 26 08/23/2022   HgbA1c:  Lab Results  Component Value Date   HGBA1C 4.7 (L) 02/26/2024   Urine Drug Screen:     Component Value Date/Time  LABOPIA NONE DETECTED 06/30/2020 0825   COCAINSCRNUR NONE DETECTED 06/30/2020 0825   LABBENZ NONE DETECTED 06/30/2020 0825   AMPHETMU NONE DETECTED 06/30/2020 0825   THCU NONE DETECTED 06/30/2020 0825   LABBARB NONE DETECTED 06/30/2020 0825    Alcohol Level No results found for: [facility] INR  Lab Results  Component Value Date   INR 1.7 (H) 11/25/2023   APTT  Lab Results  Component Value Date   APTT 33 11/15/2021   AED levels:  Lab Results  Component Value Date   LEVETIRACETA 36.6 02/13/2024    CT Head without contrast(Personally reviewed): No acute process. Sequelae of prior left MCA territory infarct again noted. Metal stent present in the left middle cerebral artery  LTM EEG 9/24:  - Intermittent slow, generalized  IMPRESSION: This study is suggestive of mild diffuse encephalopathy. No seizures or epileptiform discharges were seen throughout the recording.  Labs  Keppra  level pending    Assessment   Zaydee Aina is a 68 y.o. 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 She was BIB family after what they described as a staring-off spell, unresponsive after. Per EMS, patient was responsive to verbal stimuli with confusion, appearing postictal.  No signs of infection or metabolic derangement on labs so far.    Impression: Breakthrough seizure in patient with known seizure disorder Recommendations  Seizure  precautions  Continue LTM for another 24 hours to attempt to capture a spell  Continue home AEDs: Keppra  750mg  BID and Topiramate  100mg  BID Neurology will continue to follow  ______________________________________________________________________  Signed, Karna DELENA Geralds, NP [facility]  I have seen the patient reviewed the above note.  We have not captured a spell yet, and given that they were happening daily, I would favor continuing to try to capture one.  We will continue EEG overnight again tonight.  If we do not capture a spell, then I would favor increasing her Keppra  slightly.  Aisha Seals, MD [facility]   If 7pm- 7am, please page neurology on call as listed in [facility]ION.

## 2024-02-26 NOTE — Evaluation (Signed)
 Occupational Therapy Evaluation Patient Details Name: Courtney Burke MRN: [id number] DOB: [birth year] Today's Date: 02/26/2024   History of Present Illness   Patient is a 68 yo female who presented to the ED with a seizure like episode (staring spells). CT of head: no acute events. Currently undergoing 24 hour EEG. 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 Currently she is Mod A-Max A overall for ADLs with min A for bed mobility and sit<>stand. Pt perseverating over all the lines attached to her and wants to keep holding them in her hands thus affecting her ability to follow commands and do more of her ADLs. She will continue to benefit from acute OT with follow up HHOT as long as she has 24 hour care at home; otherwise patient will benefit from continued inpatient follow up therapy, <3 hours/day.      If plan is discharge home, recommend the following:   A little help with walking and/or transfers;A lot of help with bathing/dressing/bathroom;Direct supervision/assist for medications management;Direct supervision/assist for financial management;Help with stairs or ramp for entrance;Assist for transportation     Functional Status Assessment   Patient has had a recent decline in their functional status and demonstrates the ability to make significant improvements in function in a reasonable and predictable amount of time.     Equipment Recommendations   None recommended by OT      Precautions/Restrictions   Precautions Precautions: Fall Recall of Precautions/Restrictions: Impaired Restrictions Weight Bearing Restrictions Per Provider Order: No     Mobility Bed Mobility Overal bed mobility: Needs  Assistance Bed Mobility: Supine to Sit, Sit to Supine     Supine to sit: Min assist (A for trunk) Sit to supine: Min assist (A for legs)        Transfers Overall transfer level: Needs assistance Equipment used: 1 person hand held assist Transfers: Sit to/from Stand Sit to Stand: Min assist                  Balance Overall balance assessment: Needs assistance Sitting-balance support: No upper extremity supported, Feet supported Sitting balance-Leahy Scale: Good     Standing balance support: Single extremity supported Standing balance-Leahy Scale: Poor                             ADL either performed or assessed with clinical judgement   ADL Overall ADL's : Needs assistance/impaired Eating/Feeding: Moderate assistance;Sitting   Grooming: Moderate assistance;Sitting   Upper Body Bathing: Moderate assistance;Sitting   Lower Body Bathing: Maximal assistance Lower Body Bathing Details (indicate cue type and reason): min A sit <>stand Upper Body Dressing : Moderate assistance;Sitting   Lower Body Dressing: Maximal assistance;Sit to/from stand                 General ADL Comments: Increased A needed due to confusion and perservation on all the lines that are attached to her     Vision   Additional Comments: unsure if vision is impaired--will attempt to keep trying to assess functionally            Pertinent Vitals/Pain Pain Assessment Pain Assessment: Faces Pain Score: 0-No pain     Extremity/Trunk Assessment Upper Extremity  Assessment Upper Extremity Assessment: Overall WFL for tasks assessed           Communication Communication Communication: Impaired (receptive and expressive difficulties) Factors Affecting Communication: Difficulty expressing self   Cognition Arousal: Alert Behavior During Therapy: Restless (due to lines) Cognition: History of cognitive impairments, No family/caregiver present to determine baseline                                Following commands: Impaired Following commands impaired: Follows one step commands inconsistently     Cueing    Cueing Techniques: Verbal cues;Gestural cues;Tactile cues;Visual cues              Home Living Family/patient expects to be discharged to:: Private residence (per chart pt is from home)                                 Additional Comments: Pt unable to report her current information due to her receptive and expressive difficulties and no family present          OT Problem List: Impaired balance (sitting and/or standing);Decreased cognition   OT Treatment/Interventions: Self-care/ADL training;DME and/or AE instruction;Patient/family education;Balance training      OT Goals(Current goals can be found in the care plan section)   Acute Rehab OT Goals Patient Stated Goal: pt unable to state OT Goal Formulation: Patient unable to participate in goal setting Time For Goal Achievement: 03/11/24 Potential to Achieve Goals: Good   OT Frequency:  Min 2X/week       AM-PAC OT 6 Clicks Daily Activity     Outcome Measure Help from another person eating meals?: A Lot Help from another person taking care of personal grooming?: A Lot Help from another person toileting, which includes using toliet, bedpan, or urinal?: A Lot Help from another person bathing (including washing, rinsing, drying)?: A Lot Help from another person to put on and taking off regular upper body clothing?: A Lot Help from another person to put on and taking off regular lower body clothing?: A Lot 6 Click Score: 12   End of Session Nurse Communication:  (secure chat: empitied urine from bed pan)  Activity Tolerance: Patient tolerated treatment well Patient left: in bed;with call bell/phone within reach (both side rails of stretcher up)  OT Visit Diagnosis: Unsteadiness on feet (R26.81);Other abnormalities of gait and mobility (R26.89);Other  symptoms and signs involving cognitive function;Cognitive communication deficit (R41.841) Symptoms and signs involving cognitive functions: Cerebral infarction                Time: 1001-1020 OT Time Calculation (min): 19 min Charges:  OT General Charges $OT Visit: 1 Visit OT Evaluation $OT Eval Moderate Complexity: 1 Mod  Cathy L. OT Acute Rehabilitation Services Office [phone]   Courtney Burke 02/26/2024, 11:43 AM

## 2024-02-26 NOTE — Progress Notes (Signed)
 Transfer patient from ED to 3W01.[facility] now monitoring

## 2024-02-26 NOTE — Progress Notes (Signed)
 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 Patient presented to the hospital with staring spells. Patient was at home.  Lives with her brother.  Sitting in the chair and suddenly started having staring spell. Daughter at bedside reports that she is having this episode of almost once a month prior to this admission but for last 1 week these have been on a daily basis. This episode starts with bleeding of sweat on her forehead followed by her being not responsive and staring in a void followed by her being confused.   Assessment/Plan:   Seizure disorder with acute seizures. Mentation appears to be close to baseline right now. Able to follow commands. Presents with staring spells. No tonic-clonic movement, tongue biting reported. Compliance is reported by the family.  Unknown etiology for now. CT of the head unremarkable. No new focal deficits seen. Neurology following the patient. Received 1 g IV Keppra  loading dose. Will continue with Keppra  twice daily 750 mg IV for now although neurology specifies further. On LTM EEG monitoring. Ativan  as needed. PT consult ordered.   Prior history of CVA with expressive aphasia. No new focal deficit. CT of the head unremarkable. On Eliquis  at home.  Which I will continue. Unable to get an MRI brain. Will continue with baby aspirin  as well. SLP consulted.   Orthostatic hypotension. Concern for vasovagal syncope. Systolic blood pressure 111/75 lying, on standing drops down to 89/69. Norvasc  stopped    Hyperlipidemia On statin. Lipitor  80 mg continue.   Mood disorder. On amitriptyline . Continue.    Paroxysmal A-fib. Currently rate controlled. Not on any rate control medication at home. On Eliquis  at home. Has implantable loop recorder(ILR) Will require ILR interrogation. Monitor on telemetry for now.   GERD.   She will continue PPI.    Medications     amitriptyline   50 mg Oral QHS   apixaban   5 mg Oral BID   aspirin  EC  81 mg Oral Daily   atorvastatin   80 mg Oral Daily   DULoxetine   30 mg Oral Daily   levETIRAcetam   750 mg Intravenous BID   topiramate   100 mg Oral BID   ursodiol   300 mg Oral BID     Data Reviewed:   CBG:  No results for input(s): GLUCAP in the last 168 hours.  SpO2: 97 %    Vitals:   02/26/24 0415 02/26/24 0430 02/26/24 0445 02/26/24 0500  BP:  101/65  96/64  Pulse: 64 (!) 34 65 82  Resp: 14 11 11 11   Temp:    97.8 F (36.6 C)  TempSrc:    Temporal  SpO2: 100% 100% 98% 97%      Data Reviewed:  Basic Metabolic Panel: Recent Labs  Lab 02/25/24 1225 02/26/24 0600  NA 139 139  K 4.3 4.2  CL 113* 114*  CO2 17* 18*  GLUCOSE 87 80  BUN 17 16  CREATININE 1.97* 1.85*  CALCIUM  8.5* 8.6*    CBC: Recent Labs  Lab 02/25/24 1225 02/26/24 0600  WBC 4.8 4.2  NEUTROABS 2.8  --   HGB 11.2* 10.7*  HCT 34.7* 32.8*  MCV 85.3 84.5  PLT 310 295  LFT Recent Labs  Lab 02/25/24 1225  AST 17  ALT 10  ALKPHOS 142*  BILITOT 0.6  PROT 6.0*  ALBUMIN 3.1*     Antibiotics: Anti-infectives (From admission, onward)    None        DVT prophylaxis: Apixaban   Code Status: Full code  Family Communication: No family at bedside   CONSULTS neurology   Subjective   Pleasantly confused   Objective    Physical Examination:   Pleasantly confused, not following commands S1-S2, regular Lungs clear to auscultation bilaterally   Status is: Inpatient:             Sabas GORMAN Brod   [facility] If 7PM-7AM, please contact night-coverage at [email or web address], Office  [phone]   02/26/2024, 8:08 AM   LOS: 0 days

## 2024-02-26 NOTE — Progress Notes (Signed)
 LTM maint complete - P8,P4, FP1, FP2, no skinbreakdown. Headwrap in place

## 2024-02-27 ENCOUNTER — Encounter

## 2024-02-27 ENCOUNTER — Inpatient Hospital Stay (HOSPITAL_COMMUNITY)

## 2024-02-27 ENCOUNTER — Telehealth: Payer: Self-pay

## 2024-02-27 DIAGNOSIS — R569 Unspecified convulsions: Secondary | ICD-10-CM | POA: Diagnosis not present

## 2024-02-27 DIAGNOSIS — I480 Paroxysmal atrial fibrillation: Secondary | ICD-10-CM | POA: Diagnosis not present

## 2024-02-27 DIAGNOSIS — Z8673 Personal history of transient ischemic attack (TIA), and cerebral infarction without residual deficits: Secondary | ICD-10-CM | POA: Diagnosis not present

## 2024-02-27 LAB — GLUCOSE, CAPILLARY
Glucose-Capillary: 157 mg/dL — ABNORMAL HIGH (ref 70–99)
Glucose-Capillary: 137 mg/dL — ABNORMAL HIGH (ref 70–99)
Glucose-Capillary: 192 mg/dL — ABNORMAL HIGH (ref 70–99)

## 2024-02-27 MED ORDER — LEVETIRACETAM (KEPPRA) 500 MG/5 ML ADULT IV PUSH
1000.0000 mg | Freq: Two times a day (BID) | INTRAVENOUS | Status: DC
  Administered 2024-02-27 – 2024-02-29 (×4): 1000 mg via INTRAVENOUS
  Filled 2024-02-27 (×4): qty 10

## 2024-02-27 NOTE — Progress Notes (Signed)
 NEUROLOGY CONSULT FOLLOW UP NOTE   Date of service: February 27, 2024 Patient Name: Courtney Burke MRN:  [id number] DOB:  11-03-1955  Interval Hx/subjective   No family at the bedside. Patient is awake and alert, sitting up in bed eating breakfast.  She is more awake and interactive today. She is able to state her name, tells me age 68 and perseverates on the previous answers to following questions. She is unable to name most objects, was able to identify the water  bottle, having trouble with repeating, can follow commands. She remains on LTM.   Vitals   Vitals:   02/26/24 2044 02/26/24 2206 02/26/24 2321 02/27/24 0415  BP: (!) 81/44 106/68 110/67 104/68  Pulse: 77 73 72 64  Resp: 18  17   Temp: 98.2 F (36.8 C) 98.2 F (36.8 C) 97.7 F (36.5 C) 97.8 F (36.6 C)  TempSrc: Oral Oral Oral Oral  SpO2: 100% 99% 99% 100%     There is no height or weight on file to calculate BMI.  Physical Exam   Constitutional: Appears well-developed and well-nourished.   Psych: Affect appropriate to situation.   Eyes: No scleral injection.   HENT: No OP obstrucion.   Head: Normocephalic.   Cardiovascular: Normal rate and regular rhythm.   Respiratory: Effort normal, non-labored breathing.   GI: Soft.  No distension. There is no tenderness.   Skin: WDI.    Neurologic Examination   Mental Status -  Confused. able to state her name, stated age as 68 and perseverates with further questioning. Speech is word salad, can follow commands   Cranial Nerves II - XII - II - Visual field intact OU. III, IV, VI - Extraocular movements intact. V - Facial sensation intact bilaterally. VII - Facial movement intact bilaterally. VIII - Hearing & vestibular intact bilaterally. X - Palate elevates symmetrically. XI - Chin turning & shoulder shrug intact bilaterally. XII - Tongue protrusion intact.  Motor Strength - The patient's strength was normal in all extremities and pronator drift was  absent.  Bulk was normal and fasciculations were absent.   Motor Tone - Muscle tone was assessed at the neck and appendages and was normal. Sensory - symmetric to light touch bilaterally  Coordination - FTN is intact bilaterally, unable to assess HTS  Gait and Station - deferred.  Medications  Current Facility-Administered Medications:    acetaminophen  (TYLENOL ) tablet 650 mg, 650 mg, Oral, Q6H PRN **OR** acetaminophen  (TYLENOL ) suppository 650 mg, 650 mg, Rectal, Q6H PRN, Tobie Yetta HERO, MD   amitriptyline  (ELAVIL ) tablet 50 mg, 50 mg, Oral, QHS, Patel, Pranav M, MD, 50 mg at 02/26/24 2207   apixaban  (ELIQUIS ) tablet 5 mg, 5 mg, Oral, BID, Patel, Pranav M, MD, 5 mg at 02/26/24 2207   aspirin  EC tablet 81 mg, 81 mg, Oral, Daily, Patel, Pranav M, MD   atorvastatin  (LIPITOR ) tablet 80 mg, 80 mg, Oral, Daily, Patel, Pranav M, MD   DULoxetine  (CYMBALTA ) DR capsule 30 mg, 30 mg, Oral, Daily, Patel, Pranav M, MD   levETIRAcetam  (KEPPRA ) undiluted injection 750 mg, 750 mg, Intravenous, BID, Patel, Pranav M, MD, 750 mg at 02/26/24 2207   LORazepam  (ATIVAN ) injection 1 mg, 1 mg, Intravenous, Q4H PRN, Patel, Pranav M, MD, 1 mg at 02/26/24 1254   ondansetron  (ZOFRAN ) tablet 4 mg, 4 mg, Oral, Q6H PRN **OR** ondansetron  (ZOFRAN ) injection 4 mg, 4 mg, Intravenous, Q6H PRN, Patel, Pranav M, MD   polyethylene glycol (MIRALAX  / GLYCOLAX ) packet 17 g, 17  g, Oral, Daily PRN, Tobie Yetta HERO, MD   topiramate  (TOPAMAX ) tablet 100 mg, 100 mg, Oral, BID, Patel, Pranav M, MD, 100 mg at 02/26/24 2207   ursodiol  (ACTIGALL ) capsule 300 mg, 300 mg, Oral, BID, Patel, Pranav M, MD, 300 mg at 02/26/24 2207  Labs and Diagnostic Imaging   CBC:  Recent Labs  Lab 02/25/24 1225 02/26/24 0600  WBC 4.8 4.2  NEUTROABS 2.8  --   HGB 11.2* 10.7*  HCT 34.7* 32.8*  MCV 85.3 84.5  PLT 310 295    Basic Metabolic Panel:  Lab Results  Component Value Date   NA 139 02/26/2024   K 4.2 02/26/2024   CO2 18 (L) 02/26/2024    GLUCOSE 80 02/26/2024   BUN 16 02/26/2024   CREATININE 1.85 (H) 02/26/2024   CALCIUM  8.6 (L) 02/26/2024   GFRNONAA 29 (L) 02/26/2024   GFRAA 39 (L) 07/08/2019   Lipid Panel:  Lab Results  Component Value Date   LDLCALC 26 08/23/2022   HgbA1c:  Lab Results  Component Value Date   HGBA1C 4.7 (L) 02/26/2024   Urine Drug Screen:     Component Value Date/Time   LABOPIA NONE DETECTED 06/30/2020 0825   COCAINSCRNUR NONE DETECTED 06/30/2020 0825   LABBENZ NONE DETECTED 06/30/2020 0825   AMPHETMU NONE DETECTED 06/30/2020 0825   THCU NONE DETECTED 06/30/2020 0825   LABBARB NONE DETECTED 06/30/2020 0825    Alcohol Level No results found for: [facility] INR  Lab Results  Component Value Date   INR 1.7 (H) 11/25/2023   APTT  Lab Results  Component Value Date   APTT 33 11/15/2021   AED levels:  Lab Results  Component Value Date   LEVETIRACETA 54.0 (H) 02/25/2024    CT Head without contrast(Personally reviewed): No acute process. Sequelae of prior left MCA territory infarct again noted. Metal stent present in the left middle cerebral artery  LTM EEG 9/24:  - Intermittent slow, generalized  IMPRESSION: This study is suggestive of mild diffuse encephalopathy. No seizures or epileptiform discharges were seen throughout the recording.  LTM EEG 9/25:  - Intermittent slow, generalized IMPRESSION: This study is suggestive of mild diffuse encephalopathy. No seizures or epileptiform discharges were seen throughout the recording  Labs  Keppra  level 54   Assessment   Courtney Burke is a 68 y.o. 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 She was BIB family after what they described as a staring-off spell, unresponsive after. Per EMS, patient was responsive to verbal stimuli with confusion, appearing postictal.  No signs of infection or metabolic  derangement on labs so far.    Impression: Breakthrough seizure in patient with known seizure disorder Recommendations  Seizure precautions  continue LTM today. Likely will discontinue tomorrow  Increase Keppra  to 1000mg  BID and Continue Topiramate  100mg  BID If no spells captured on EEG overnight, patient can likely be discharged home and continue above meds with outpatient follow up  Neurology will continue to follow  ______________________________________________________________________  Signed, Karna DELENA Geralds, NP [facility]

## 2024-02-27 NOTE — Evaluation (Addendum)
 Physical Therapy Evaluation Patient Details Name: Courtney Burke MRN: [id number] DOB: 09-18-1955 Today's Date: 02/27/2024  History of Present Illness  Patient is a 68 yo female who presented to the ED with a seizure like episode (staring spells). EEG suggestive of mild diffuse encephalopathy. 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 Pt was able to state that she ambulated with a RW prior to admission. Pt was CGA/MinA for all mobility with ability to ambulate 38ft with CGA and RW. If pt has 24/7 care at home, pt would benefit from HHPT. If pt does not have assist available, would then recommend <3hrs post acute rehab. Acute PT to follow to address functional deficits.          If plan is discharge home, recommend the following: A little help with walking and/or transfers;A little help with bathing/dressing/bathroom;Direct supervision/assist for medications management;Direct supervision/assist for financial management;Assist for transportation;Help with stairs or ramp for entrance   Can travel by private vehicle    Yes    Equipment Recommendations Other (comment) (RW if pt does not have one at home)     Functional Status Assessment Patient has had a recent decline in their functional status and demonstrates the ability to make significant improvements in function in a reasonable and predictable amount of time.     Precautions / Restrictions Precautions Precautions: Fall Recall of Precautions/Restrictions: Impaired Precaution/Restrictions Comments: continuous EEG Restrictions Weight Bearing Restrictions Per Provider Order: No      Mobility  Bed Mobility Overal bed mobility: Needs Assistance Bed Mobility: Supine to Sit, Sit to Supine     Supine to sit: Contact guard Sit to supine: Min assist   General bed mobility comments: CGA for safety with cueing for technique, MinA to return to supine for slight LE management    Transfers Overall transfer level: Needs assistance Equipment used: Rolling walker (2 wheels) Transfers: Sit to/from Stand Sit to Stand: Min assist    General transfer comment: MinA for slight boost-up with cueing for hand placement    Ambulation/Gait Ambulation/Gait assistance: Contact guard assist Gait Distance (Feet): 15 Feet Assistive device: Rolling walker (2 wheels) Gait Pattern/deviations: Step-through pattern, Decreased stride length, Trunk flexed Gait velocity: decr     General Gait Details: Slightly forward flexed trunk with cues for upright posture. Distance limited by lines     Balance Overall balance assessment: Needs assistance Sitting-balance support: No upper extremity supported, Feet supported Sitting balance-Leahy Scale: Good     Standing balance support: Bilateral upper extremity supported, During functional activity, Reliant on assistive device for balance Standing balance-Leahy Scale: Poor Standing balance comment: reliant on UE and external support       Pertinent Vitals/Pain Pain Assessment Pain Assessment: No/denies pain    Home Living Family/patient expects to be discharged to:: Private residence  Additional Comments: Pt unable to report her current information due to her receptive and expressive difficulties and no family present    Prior Function    Mobility Comments: Pt reported ambulating with a RW       Extremity/Trunk Assessment   Upper Extremity Assessment Upper Extremity Assessment: Defer to OT evaluation    Lower Extremity Assessment Lower Extremity Assessment: Generalized weakness       Communication   Communication Communication: Impaired (receptive and  expressive difficulties) Factors Affecting Communication: Difficulty expressing  self;Reduced clarity of speech    Cognition Arousal: Alert Behavior During Therapy: Restless   PT - Cognitive impairments: No family/caregiver present to determine baseline, Orientation, Awareness, Memory, Attention, Sequencing, Problem solving, Safety/Judgement   Orientation impairments: Place, Situation, Time    PT - Cognition Comments: A&Ox1 with mostly incomprehensible speech. Frequent cueing for safety and to sequence mobility Following commands: Impaired Following commands impaired: Follows one step commands inconsistently     Cueing Cueing Techniques: Verbal cues, Gestural cues, Tactile cues, Visual cues      PT Assessment Patient needs continued PT services  PT Problem List Decreased strength;Decreased activity tolerance;Decreased balance;Decreased mobility;Decreased cognition;Decreased knowledge of use of DME;Decreased safety awareness       PT Treatment Interventions DME instruction;Gait training;Stair training;Functional mobility training;Therapeutic activities;Therapeutic exercise;Balance training;Neuromuscular re-education;Patient/family education    PT Goals (Current goals can be found in the Care Plan section)  Acute Rehab PT Goals Patient Stated Goal: unable to state goal PT Goal Formulation: Patient unable to participate in goal setting Time For Goal Achievement: 03/12/24 Potential to Achieve Goals: Good    Frequency Min 2X/week        AM-PAC PT 6 Clicks Mobility  Outcome Measure Help needed turning from your back to your side while in a flat bed without using bedrails?: None Help needed moving from lying on your back to sitting on the side of a flat bed without using bedrails?: A Little Help needed moving to and from a bed to a chair (including a wheelchair)?: A Little Help needed standing up from a chair using your arms (e.g., wheelchair or bedside chair)?: A Little Help needed to walk in hospital room?: A Little Help needed climbing 3-5 steps with a  railing? : A Lot 6 Click Score: 18    End of Session Equipment Utilized During Treatment: Gait belt Activity Tolerance: Patient tolerated treatment well Patient left: in bed;with call bell/phone within reach;with bed alarm set Nurse Communication: Mobility status PT Visit Diagnosis: Unsteadiness on feet (R26.81);Other abnormalities of gait and mobility (R26.89);Muscle weakness (generalized) (M62.81)    Time: 8762-8745 PT Time Calculation (min) (ACUTE ONLY): 17 min   Charges:   PT Evaluation $PT Eval Moderate Complexity: 1 Mod   PT General Charges $$ ACUTE PT VISIT: 1 Visit        Kate ORN, PT, DPT Secure Chat Preferred  Rehab Office [phone]  Kate BRAVO Wendolyn 02/27/2024, 1:19 PM

## 2024-02-27 NOTE — Plan of Care (Signed)

## 2024-02-27 NOTE — Plan of Care (Signed)
  Problem: Health Behavior/Discharge Planning: Goal: Ability to manage health-related needs will improve Outcome: Progressing   Problem: Clinical Measurements: Goal: Will remain free from infection Outcome: Progressing   Problem: Clinical Measurements: Goal: Cardiovascular complication will be avoided Outcome: Progressing   Problem: Activity: Goal: Risk for activity intolerance will decrease Outcome: Progressing   Problem: Coping: Goal: Level of anxiety will decrease Outcome: Progressing   Problem: Elimination: Goal: Will not experience complications related to bowel motility Outcome: Progressing   Problem: Elimination: Goal: Will not experience complications related to urinary retention Outcome: Progressing   Problem: Safety: Goal: Ability to remain free from injury will improve Outcome: Progressing   Problem: Skin Integrity: Goal: Risk for impaired skin integrity will decrease Outcome: Progressing

## 2024-02-27 NOTE — Telephone Encounter (Signed)
 Patient's EEG order is on hold and packet has been provided to Harlene Bogaert, NP. Once signed forms and patient information can be emailed to [email or web address]

## 2024-02-27 NOTE — Progress Notes (Signed)
   02/27/24 0945  Spiritual Encounters  Type of Visit Initial  Care provided to: Patient  Conversation partners present during encounter Nurse  Reason for visit Advance directives  OnCall Visit No    Chaplain responded to spiritual care consult for advance directives. Paperwork and information provided and all questions answered at this time. Pt Courtney Burke wishes for her mother to serve as primary HCPOA and her father to serve as secondary.  Chaplains remain available as further needs arise.

## 2024-02-27 NOTE — Progress Notes (Signed)
 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 Patient presented to the hospital with staring spells. Patient was at home.  Lives with her brother.  Sitting in the chair and suddenly started having staring spell. Daughter at bedside reports that she is having this episode of almost once a month prior to this admission but for last 1 week these have been on a daily basis. This episode starts with bleeding of sweat on her forehead followed by her being not responsive and staring in a void followed by her being confused.   Assessment/Plan:   Seizure disorder with concern for recurrent seizures Mentation appears to be close to baseline right now. No tonic-clonic movement, tongue biting reported. Compliance is reported by the family.  Unknown etiology for now. CT of the head unremarkable. No new focal deficits seen. Neurology following the patient. Received 1 g IV Keppra  loading dose. Will continue with Keppra  twice daily 750 mg IV for now although neurology specifies further. On LTM EEG monitoring. Ativan  as needed. PT consult ordered.   Prior history of CVA with expressive aphasia. No new focal deficit. CT of the head unremarkable. On Eliquis  at home. . Unable to get an MRI brain. Will continue with baby aspirin  as well. SLP consulted.  Mild aspiration risk.  Started on regular diet with thin liquid.   Orthostatic hypotension. Concern for vasovagal syncope. Systolic blood pressure 111/75 lying, on standing drops down to 89/69. Norvasc  stopped  CKD stage IIIb - Creatinine is 1.85, at baseline    Hyperlipidemia On statin. Lipitor  80 mg continue.    Mood disorder. On amitriptyline . Continue.   Paroxysmal A-fib. Currently rate controlled. Not on any rate control medication at home. On Eliquis  at home. Has implantable loop recorder(ILR) Will require ILR interrogation. Monitor on telemetry for now.   GERD.   She will continue PPI.    Medications     amitriptyline   50 mg Oral QHS   apixaban   5 mg Oral BID   aspirin  EC  81 mg Oral Daily   atorvastatin   80 mg Oral Daily   DULoxetine   30 mg Oral Daily   levETIRAcetam   750 mg Intravenous BID   topiramate   100 mg Oral BID   ursodiol   300 mg Oral BID     Data Reviewed:   CBG:  Recent Labs  Lab 02/27/24 1127  GLUCAP 157*    SpO2: 100 %    Vitals:   02/26/24 2321 02/27/24 0415 02/27/24 0912 02/27/24 1126  BP: 110/67 104/68 (!) 94/55 107/64  Pulse: 72 64 77 78  Resp: 17  18 18   Temp: 97.7 F (36.5 C) 97.8 F (36.6 C) 97.8 F (36.6 C) 98.3 F (36.8 C)  TempSrc: Oral Oral Oral Oral  SpO2: 99% 100% 100% 100%      Data Reviewed:  Basic Metabolic Panel: Recent Labs  Lab 02/25/24 1225 02/26/24 0600  NA 139 139  K 4.3 4.2  CL 113* 114*  CO2 17* 18*  GLUCOSE 87 80  BUN 17 16  CREATININE 1.97* 1.85*  CALCIUM  8.5* 8.6*    CBC: Recent Labs  Lab 02/25/24 1225 02/26/24 0600  WBC 4.8 4.2  NEUTROABS 2.8  --  HGB 11.2* 10.7*  HCT 34.7* 32.8*  MCV 85.3 84.5  PLT 310 295    LFT Recent Labs  Lab 02/25/24 1225  AST 17  ALT 10  ALKPHOS 142*  BILITOT 0.6  PROT 6.0*  ALBUMIN 3.1*     Antibiotics: Anti-infectives (From admission, onward)    None        DVT prophylaxis: Apixaban   Code Status: Full code  Family Communication: No family at bedside   CONSULTS neurology   Subjective   Patient seen, able to tell her name.  Does not know which place she is at currently.   Objective    Physical Examination:  Alert, not following commands Talking in gibberish S1-S2, regular Lungs are clear to auscultation  bilaterally Abdomen is soft, nontender, no organomegaly   Status is: Inpatient:             Sabas GORMAN Brod   [facility] If 7PM-7AM, please contact night-coverage at [email or web address], Office  [phone]   02/27/2024, 11:51 AM  LOS: 1 day

## 2024-02-27 NOTE — Procedures (Signed)
 Patient Name: Courtney Burke  MRN: [id number]  Epilepsy Attending: Arlin MALVA Krebs  Referring Physician/Provider: Judithe Rocky BROCKS, NP  Duration: 01/26/2024 1730 to 01/27/2024 1730   Patient history: 68 y.o. 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 She was BIB family after what they described as a staring-off spell. She then went unresponsive in a seated position, was moved to her bed by family, EMS called. Per EMS, patient was responsive to verbal stimuli with confusion, appearing postictal. EEG to evaluate for seizure   Level of alertness: Awake, asleep   AEDs during EEG study: LEV, TPM   Technical aspects: This EEG study was done with scalp electrodes positioned according to the 10-20 International system of electrode placement. Electrical activity was reviewed with band pass filter of 1-70Hz , sensitivity of 7 uV/mm, display speed of 38mm/sec with a 60Hz  notched filter applied as appropriate. EEG data were recorded continuously and digitally stored.  Video monitoring was available and reviewed as appropriate.   Description: The posterior dominant rhythm consists of 7.5 Hz activity of moderate voltage (25-35 uV) seen predominantly in posterior head regions, symmetric and reactive to eye opening and eye closing. Sleep was characterized by vertex waves, sleep spindles (12 to 14 Hz), maximal frontocentral region.  There is intermittent generalized 3 to 6 Hz theta-delta slowing. Hyperventilation and photic stimulation were not performed.      ABNORMALITY - Intermittent slow, generalized   IMPRESSION: This study is suggestive of mild diffuse encephalopathy. No seizures or epileptiform discharges were seen throughout the recording.   Kyandra Mcclaine O Janique Hoefer

## 2024-02-27 NOTE — Progress Notes (Signed)
 LTM maint complete - no skin breakdown under: Fp2 F4 Serviced P3 MRI compatible leads  [facility] monitored, Event button test confirmed by [facility].

## 2024-02-27 NOTE — Progress Notes (Signed)
 vLTM maintenance  All impedances below 10k  No skin breakdown noted at P3 A1  T3 F7

## 2024-02-27 NOTE — TOC Initial Note (Signed)
 Transition of Care [facility]) - Initial/Assessment Note    Patient Details  Name: Courtney Burke MRN: [id number] Date of Birth: May 22, 1956  Transition of Care [facility] CM/SW Contact:    Andrez JULIANNA George, RN Phone Number: 02/27/2024, 2:16 PM  Clinical Narrative:                 Patient with PMH of CVA with residual deficit with expressive aphasia, HTN, seizure disorder, HLD, gastric bypass history, type 2 diabetes mellitus now not on any medication, mood disorder, GERD, paroxysmal A-fib on Eliquis , ILR implant.   Pt is from home with her daughter. Daughter states she or family can provide 24 hour supervision at home. Daughter prefers home over SNF.  Daughter manages her medications and provides needed transportation.  Home health arranged with [facility]. Information on the AVS. [facility] will contact her for the first home visit.   IP Care management following.  Expected Discharge Plan: Home w Home Health Services Barriers to Discharge: Continued Medical Work up   Patient Goals and CMS Choice   CMS Medicare.gov Compare Post Acute Care list provided to:: Patient Represenative (must comment) Choice offered to / list presented to : Adult Children      Expected Discharge Plan and Services   Discharge Planning Services: CM Consult Post Acute Care Choice: Home Health Living arrangements for the past 2 months: Single Family Home                           HH Arranged: PT, OT HH Agency: [facility] Date [facility] Agency Contacted: 02/27/24   Representative spoke with at [facility] Agency: Amy  Prior Living Arrangements/Services Living arrangements for the past 2 months: Single Family Home Lives with:: Adult Children Patient language and need for interpreter reviewed:: Yes Do you feel safe going back to the place where you live?: Yes        Care giver support system in place?: Yes (comment) Current home services: DME (wheelchair/ walker/ shower seat/ [facility]) Criminal Activity/Legal  Involvement Pertinent to Current Situation/Hospitalization: No - Comment as needed  Activities of Daily Living      Permission Sought/Granted                  Emotional Assessment Appearance:: Appears stated age Attitude/Demeanor/Rapport:  (Aphasic)   Orientation: : Oriented to Self   Psych Involvement: No (comment)  Admission diagnosis:  Seizure (HCC) [R56.9] Patient Active Problem List   Diagnosis Date Noted   Seizure (HCC) 02/25/2024   Trigger middle finger of left hand 03/28/2022   Aphasia following cerebrovascular accident (CVA) 03/28/2022   Hypokalemia 11/12/2021   Hypomagnesemia 11/12/2021   Normocytic anemia 11/10/2021   Hyponatremia 11/09/2021   Metabolic acidosis 11/09/2021   Intractable nausea and vomiting 11/08/2021   Paroxysmal atrial fibrillation (HCC) 11/08/2021   History of CVA (cerebrovascular accident) 07/24/2021   Syncope 05/16/2021   Breast lump 05/16/2021   Seizure disorder (HCC) 05/16/2021   CVA (cerebral vascular accident) (HCC) 05/11/2021   Acute cerebral infarction (HCC) 05/10/2021   Dyslipidemia 05/09/2021   Type 2 diabetes mellitus with obesity 05/09/2021   Cerebral infarction due to unspecified occlusion or stenosis of left middle cerebral artery (HCC) 06/30/2020   Gallstones 08/18/2019   Esophagitis 07/20/2019   Erosive gastritis 07/20/2019   Nausea vomiting and diarrhea 07/16/2019   AKI (acute kidney injury) 07/15/2019   Chronic bilateral thoracic back pain 06/05/2018   Large breasts 06/05/2018   Seasonal allergic  rhinitis due to pollen 05/09/2017   Microcalcifications of the breast 07/02/2016   Mild intermittent asthma without complication 05/08/2016   Severe single current episode of major depressive disorder, without psychotic features (HCC) 05/08/2016   Vitamin D  deficiency 05/08/2016   Encounter for health maintenance examination 05/08/2016   Persistent headaches 02/16/2016   Arthralgia of multiple joints 09/28/2015    Age-related nuclear cataract of both eyes 09/28/2015   Asthma 09/28/2015   Essential hypertension 09/28/2015   Essential thrombocytosis (HCC) 09/28/2015   Gastroesophageal reflux disease without esophagitis 09/28/2015   Keratoconjunctivitis sicca of both eyes not specified as Sjogren's 09/28/2015   Status post gastric bypass for obesity 09/28/2015   Stage 3b chronic kidney disease (HCC) 09/28/2015   Esophageal dysphagia 09/28/2015   Hot flashes 09/28/2015   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   IFG (impaired fasting glucose) 09/28/2015   Menopausal disorder 09/28/2015   Morbid obesity with BMI of 40.0-44.9, adult (HCC) 09/28/2015   Non-smoker 09/28/2015   Pelvic pain in female 09/28/2015   Presbyopia of both eyes 09/28/2015   Snoring 09/28/2015   Tinea corporis 09/28/2015   Osteopenia 07/15/2015   Benign hypertension with CKD (chronic kidney disease) stage III (HCC) 08/11/2014   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   Hyperlipidemia 03/16/2013   PCP:  [name], MD Pharmacy:   [id number] - [place], [place] - [address] [place] [address] Phone: [phone] Fax: [phone]     Social Drivers of Health (SDOH) Social History: SDOH Screenings   Food Insecurity: Patient Unable To Answer (02/27/2024)  Recent Concern: Food Insecurity - Food Insecurity Present (02/14/2024)  Housing: Unknown (02/27/2024)  Recent Concern: Housing - High Risk (02/14/2024)  Transportation Needs: Patient Unable To Answer (02/27/2024)  Utilities: Patient Unable To Answer (02/27/2024)  Alcohol Screen: Low Risk  (02/14/2024)  Depression (PHQ2-9): Medium Risk (02/13/2023)  Financial Resource Strain: High Risk (02/14/2024)  Physical Activity: Unknown (02/14/2024)  Social Connections: Patient Unable To Answer (02/27/2024)  Recent Concern: Social Connections - Socially Isolated (02/14/2024)  Stress: Stress Concern Present (02/14/2024)   Tobacco Use: Low Risk  (02/26/2024)   SDOH Interventions:     Readmission Risk Interventions    11/15/2021    1:04 PM  Readmission Risk Prevention Plan  Transportation Screening Complete  PCP or Specialist Appt within 3-5 Days Complete  HRI or Home Care Consult Complete  Social Work Consult for Recovery Care Planning/Counseling Complete  Palliative Care Screening Not Applicable  Medication Review Oceanographer) Complete

## 2024-02-28 ENCOUNTER — Inpatient Hospital Stay (HOSPITAL_COMMUNITY)

## 2024-02-28 DIAGNOSIS — E785 Hyperlipidemia, unspecified: Secondary | ICD-10-CM

## 2024-02-28 DIAGNOSIS — I10 Essential (primary) hypertension: Secondary | ICD-10-CM

## 2024-02-28 LAB — GLUCOSE, CAPILLARY
Glucose-Capillary: 98 mg/dL (ref 70–99)
Glucose-Capillary: 109 mg/dL — ABNORMAL HIGH (ref 70–99)
Glucose-Capillary: 134 mg/dL — ABNORMAL HIGH (ref 70–99)
Glucose-Capillary: 87 mg/dL (ref 70–99)

## 2024-02-28 MED ORDER — LACTATED RINGERS IV SOLN: INTRAVENOUS | Status: AC

## 2024-02-28 NOTE — Progress Notes (Signed)
 vLTM discontinued.  [facility] notified.  No skin breakdown noted at all skin sites. ?

## 2024-02-28 NOTE — Progress Notes (Signed)
 NEUROLOGY CONSULT FOLLOW UP NOTE   Date of service: February 28, 2024 Patient Name: Courtney Burke MRN:  [id number] DOB:  01-10-1956  Interval Hx/subjective   No family at the bedside. Patient is awake and alert, sitting up in bed eating breakfast.  She is awake and alert in NAD. No new neurological event overnight. Neurological exam remains stable and unchanged  Still on LTM however with discontinue today   Vitals   Vitals:   02/27/24 1618 02/27/24 2031 02/28/24 0102 02/28/24 0538  BP: 107/70 (!) 92/58 117/70 (!) 100/59  Pulse: 74 77 75 69  Resp: 18 18 18 18   Temp: 98.1 F (36.7 C) 98.7 F (37.1 C) 98.1 F (36.7 C) 98.5 F (36.9 C)  TempSrc: Oral Oral Oral Oral  SpO2: 100% 100% 100% 95%     There is no height or weight on file to calculate BMI.  Physical Exam   Constitutional: Appears well-developed and well-nourished.   Psych: Affect appropriate to situation.   Eyes: No scleral injection.   HENT: No OP obstrucion.   Head: Normocephalic.   Cardiovascular: Normal rate and regular rhythm.   Respiratory: Effort normal, non-labored breathing.   GI: Soft.  No distension. There is no tenderness.   Skin: WDI.    Neurologic Examination   Mental Status -  Confused. able to state her name, unable to state her age but able to state her full date of birth and perseverates with further questioning. Speech is word salad, can follow commands   Cranial Nerves II - XII - II - Visual field intact OU. III, IV, VI - Extraocular movements intact. V - Facial sensation intact bilaterally. VII - Facial movement intact bilaterally. VIII - Hearing & vestibular intact bilaterally. X - Palate elevates symmetrically. XI - Chin turning & shoulder shrug intact bilaterally. XII - Tongue protrusion intact.  Motor Strength - The patient's strength was normal in all extremities and pronator drift was absent.  Bulk was normal and fasciculations were absent.   Motor Tone - Muscle tone was  assessed at the neck and appendages and was normal. Sensory - symmetric to light touch bilaterally  Coordination - FTN is intact bilaterally, unable to assess HTS  Gait and Station - deferred.  Medications  Current Facility-Administered Medications:    acetaminophen  (TYLENOL ) tablet 650 mg, 650 mg, Oral, Q6H PRN **OR** acetaminophen  (TYLENOL ) suppository 650 mg, 650 mg, Rectal, Q6H PRN, Tobie Yetta HERO, MD   amitriptyline  (ELAVIL ) tablet 50 mg, 50 mg, Oral, QHS, Patel, Pranav M, MD, 50 mg at 02/27/24 2201   apixaban  (ELIQUIS ) tablet 5 mg, 5 mg, Oral, BID, Patel, Pranav M, MD, 5 mg at 02/27/24 2115   aspirin  EC tablet 81 mg, 81 mg, Oral, Daily, Patel, Pranav M, MD, 81 mg at 02/27/24 1003   atorvastatin  (LIPITOR ) tablet 80 mg, 80 mg, Oral, Daily, Patel, Pranav M, MD, 80 mg at 02/27/24 1003   DULoxetine  (CYMBALTA ) DR capsule 30 mg, 30 mg, Oral, Daily, Patel, Pranav M, MD, 30 mg at 02/27/24 1003   levETIRAcetam  (KEPPRA ) undiluted injection 1,000 mg, 1,000 mg, Intravenous, BID, Waddell Aquas A, NP, 1,000 mg at 02/27/24 2116   LORazepam  (ATIVAN ) injection 1 mg, 1 mg, Intravenous, Q4H PRN, Patel, Pranav M, MD, 1 mg at 02/26/24 1254   ondansetron  (ZOFRAN ) tablet 4 mg, 4 mg, Oral, Q6H PRN **OR** ondansetron  (ZOFRAN ) injection 4 mg, 4 mg, Intravenous, Q6H PRN, Patel, Pranav M, MD   polyethylene glycol (MIRALAX  / GLYCOLAX ) packet 17 g, 17  g, Oral, Daily PRN, Tobie Yetta HERO, MD   topiramate  (TOPAMAX ) tablet 100 mg, 100 mg, Oral, BID, Patel, Pranav M, MD, 100 mg at 02/27/24 2116   ursodiol  (ACTIGALL ) capsule 300 mg, 300 mg, Oral, BID, Patel, Pranav M, MD, 300 mg at 02/27/24 2115  Labs and Diagnostic Imaging   CBC:  Recent Labs  Lab 02/25/24 1225 02/26/24 0600  WBC 4.8 4.2  NEUTROABS 2.8  --   HGB 11.2* 10.7*  HCT 34.7* 32.8*  MCV 85.3 84.5  PLT 310 295    Basic Metabolic Panel:  Lab Results  Component Value Date   NA 139 02/26/2024   K 4.2 02/26/2024   CO2 18 (L) 02/26/2024   GLUCOSE 80  02/26/2024   BUN 16 02/26/2024   CREATININE 1.85 (H) 02/26/2024   CALCIUM  8.6 (L) 02/26/2024   GFRNONAA 29 (L) 02/26/2024   GFRAA 39 (L) 07/08/2019   Lipid Panel:  Lab Results  Component Value Date   LDLCALC 26 08/23/2022   HgbA1c:  Lab Results  Component Value Date   HGBA1C 4.7 (L) 02/26/2024   Urine Drug Screen:     Component Value Date/Time   LABOPIA NONE DETECTED 06/30/2020 0825   COCAINSCRNUR NONE DETECTED 06/30/2020 0825   LABBENZ NONE DETECTED 06/30/2020 0825   AMPHETMU NONE DETECTED 06/30/2020 0825   THCU NONE DETECTED 06/30/2020 0825   LABBARB NONE DETECTED 06/30/2020 0825    Alcohol Level No results found for: [facility] INR  Lab Results  Component Value Date   INR 1.7 (H) 11/25/2023   APTT  Lab Results  Component Value Date   APTT 33 11/15/2021   AED levels:  Lab Results  Component Value Date   LEVETIRACETA 54.0 (H) 02/25/2024    CT Head without contrast(Personally reviewed): No acute process. Sequelae of prior left MCA territory infarct again noted. Metal stent present in the left middle cerebral artery  LTM EEG 9/24:  - Intermittent slow, generalized  IMPRESSION: This study is suggestive of mild diffuse encephalopathy. No seizures or epileptiform discharges were seen throughout the recording.  LTM EEG 9/25:  - Intermittent slow, generalized IMPRESSION: This study is suggestive of mild diffuse encephalopathy. No seizures or epileptiform discharges were seen throughout the recording  LTM EEG 9/26:    Labs  Keppra  level 54   Assessment   Courtney Burke is a 68 y.o. 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 She was BIB family after what they described as a staring-off spell, unresponsive after. Per EMS, patient was responsive to verbal stimuli with confusion, appearing postictal.  No signs of infection or  metabolic derangement on labs so far.    Impression: Breakthrough seizure in patient with known seizure disorder Recommendations  Seizure precautions  Discontinue LTM EEG today  Continue Keppra  1000mg  BID and Continue Topiramate  100mg  BID Patient will need follow up with neurology in 1 month after discharge.  From neurology standpoint patient can be discharged home today on current AEDs Neurology will sign off Please call with questions or concerns   ______________________________________________________________________  Signed, Karna DELENA Geralds, NP [facility]

## 2024-02-28 NOTE — Progress Notes (Signed)
 [facility]  PROGRESS NOTE  Courtney Burke FMW:[id number] DOB: [birth year] DOA: 02/25/2024 PCP: [name], MD   Brief HPI:    Patient with PMH of CVA with residual deficit with expressive aphasia, HTN, seizure disorder, HLD, gastric bypass history, type 2 diabetes mellitus now not on any medication, mood disorder, GERD, paroxysmal A-fib on Eliquis , ILR implant. Patient presented to the hospital with staring spells. Patient was at home.  Lives with her brother.  Sitting in the chair and suddenly started having staring spell. Daughter at bedside reports that she is having this episode of almost once a month prior to this admission but for last 1 week these have been on a daily basis. This episode starts with bleeding of sweat on her forehead followed by her being not responsive and staring in a void followed by her being confused.   Assessment/Plan:   Seizure disorder with concern for recurrent seizures Mentation appears to be close to baseline right now. No tonic-clonic movement, tongue biting reported. Compliance is reported by the family.  Unknown etiology for now. CT of the head unremarkable. No new focal deficits seen. Neurology following the patient. Received 1 g IV Keppra  loading dose. -Continue Keppra  1000 mg p.o. twice daily, continue topiramate  100 mg p.o. twice daily -LTM EEG did not show any more seizures -Neurology has signed off -Need home health PT    Prior history of CVA with expressive aphasia. No new focal deficit. CT of the head unremarkable. On Eliquis  at home. . Unable to get an MRI brain. Will continue with baby aspirin  as well. SLP consulted.  Mild aspiration risk.  Started on regular diet with thin liquid.   Orthostatic hypotension. Concern for vasovagal syncope. Systolic blood pressure 111/75 lying, on standing drops down to 89/69. Norvasc  stopped - Will start TED hose, start LR at 75 mL/h for 12 hours - Check orthostatics in  a.m.  CKD stage IIIb - Creatinine is 1.85, at baseline - Follow renal function in a.m.    Hyperlipidemia On statin. Lipitor  80 mg continue.   Mood disorder. On amitriptyline . Continue.   Paroxysmal A-fib. Currently rate controlled. Not on any rate control medication at home. On Eliquis  at home. Has implantable loop recorder(ILR) Will require ILR interrogation. Monitor on telemetry for now.   GERD.   She will continue PPI.    Medications     amitriptyline   50 mg Oral QHS   apixaban   5 mg Oral BID   aspirin  EC  81 mg Oral Daily   atorvastatin   80 mg Oral Daily   DULoxetine   30 mg Oral Daily   levETIRAcetam   1,000 mg Intravenous BID   topiramate   100 mg Oral BID   ursodiol   300 mg Oral BID     Data Reviewed:   CBG:  Recent Labs  Lab 02/27/24 2113 02/28/24 0649 02/28/24 1015 02/28/24 1203 02/28/24 1519  GLUCAP 192* 98 109* 134* 87    SpO2: 100 %    Vitals:   02/28/24 0538 02/28/24 0833 02/28/24 1201 02/28/24 1521  BP: (!) 100/59 (!) 96/56 94/60 100/62  Pulse: 69 77 74 72  Resp: 18 19 18 18   Temp: 98.5 F (36.9 C) (!) 97.5 F (36.4 C) 97.9 F (36.6 C) 98.1 F (36.7 C)  TempSrc: Oral Oral Oral Oral  SpO2: 95% 95% 98% 100%      Data Reviewed:  Basic Metabolic Panel: Recent Labs  Lab 02/25/24 1225 02/26/24 0600  NA 139 139  K 4.3  4.2  CL 113* 114*  CO2 17* 18*  GLUCOSE 87 80  BUN 17 16  CREATININE 1.97* 1.85*  CALCIUM  8.5* 8.6*    CBC: Recent Labs  Lab 02/25/24 1225 02/26/24 0600  WBC 4.8 4.2  NEUTROABS 2.8  --   HGB 11.2* 10.7*  HCT 34.7* 32.8*  MCV 85.3 84.5  PLT 310 295    LFT Recent Labs  Lab 02/25/24 1225  AST 17  ALT 10  ALKPHOS 142*  BILITOT 0.6  PROT 6.0*  ALBUMIN 3.1*     Antibiotics: Anti-infectives (From admission, onward)    None        DVT prophylaxis: Apixaban   Code Status: Full code  Family Communication: No family at bedside   CONSULTS neurology   Subjective   Patient seen  and examined, no new complaints.  Neurology has signed off.  Blood pressure continues to be on the low side.   Objective    Physical Examination:  Appears in no acute distress Alert but confused at baseline, follows simple commands, able to tell me her name Lungs clear to auscultation bilaterally Abdomen is soft, nontender, no organomegaly Extremities no edema   Status is: Inpatient:             Sabas GORMAN Brod   [facility] If 7PM-7AM, please contact night-coverage at [email or web address], Office  [phone]   02/28/2024, 4:40 PM  LOS: 2 days

## 2024-02-28 NOTE — Plan of Care (Signed)

## 2024-02-28 NOTE — Procedures (Signed)
 Patient Name: Petrice Beedy  MRN: [id number]  Epilepsy Attending: Arlin MALVA Krebs  Referring Physician/Provider: Judithe Rocky BROCKS, NP  Duration: 01/27/2024 1730 to 01/27/2024 0143   Patient history: 68 y.o. 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 She was BIB family after what they described as a staring-off spell. She then went unresponsive in a seated position, was moved to her bed by family, EMS called. Per EMS, patient was responsive to verbal stimuli with confusion, appearing postictal. EEG to evaluate for seizure   Level of alertness: Awake, asleep   AEDs during EEG study: LEV, TPM   Technical aspects: This EEG study was done with scalp electrodes positioned according to the 10-20 International system of electrode placement. Electrical activity was reviewed with band pass filter of 1-70Hz , sensitivity of 7 uV/mm, display speed of 56mm/sec with a 60Hz  notched filter applied as appropriate. EEG data were recorded continuously and digitally stored.  Video monitoring was available and reviewed as appropriate.   Description: The posterior dominant rhythm consists of 7.5 Hz activity of moderate voltage (25-35 uV) seen predominantly in posterior head regions, symmetric and reactive to eye opening and eye closing. Sleep was characterized by vertex waves, sleep spindles (12 to 14 Hz), maximal frontocentral region.  There is intermittent generalized 3 to 6 Hz theta-delta slowing. Hyperventilation and photic stimulation were not performed.      ABNORMALITY - Intermittent slow, generalized   IMPRESSION: This study is suggestive of mild diffuse encephalopathy. No seizures or epileptiform discharges were seen throughout the recording.   Raygen Dahm O Elishah Ashmore

## 2024-02-28 NOTE — Progress Notes (Signed)
 Occupational Therapy Treatment Patient Details Name: Courtney Burke MRN: [id number] DOB: 12-28-1955 Today's Date: 02/28/2024   History of present illness Patient is a 68 yo female who presented to the ED with a seizure like episode (staring spells). EEG suggestive of mild diffuse encephalopathy. 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 RN reported EEG discontinued earlier this AM. Pt progressed to standing ADLs at the sink. Min A for transfers and short mobility via RW. Session ceased early because while at the sink, pt became unresponsive to noxious stim (sternal rub), observed cervical rigidity, max assisted pt to seated position. OT called for help with RN coming to room to assist pt back to bed, multiple RN in room to assess pt with VSS - BP: 118/71 (84), O2 100% on RA, HR 73. Pt aroused once in supine without recollection of events. RN in room upon OT departure. Will continue to follow.      If plan is discharge home, recommend the following:  A little help with walking and/or transfers;A lot of help with bathing/dressing/bathroom;Direct supervision/assist for medications management;Direct supervision/assist for financial management;Help with stairs or ramp for entrance;Assist for transportation   Equipment Recommendations  None recommended by OT    Recommendations for Other Services      Precautions / Restrictions Precautions Precautions: Fall Recall of Precautions/Restrictions: Impaired Precaution/Restrictions Comments: monitor VS; EEG d/c'd 9/26 AM Restrictions Weight Bearing Restrictions Per Provider Order: No       Mobility Bed Mobility Overal bed mobility: Needs Assistance Bed Mobility: Supine to Sit, Sit to Supine     Supine to sit: Contact guard, HOB elevated, Used  rails Sit to supine: Total assist, +2 for physical assistance, HOB elevated, Used rails   General bed mobility comments: Total A +2 for return to supine due to pt being unresponsive. RN present to assist. Observed pt with rigid neck and LEs    Transfers Overall transfer level: Needs assistance Equipment used: Rolling walker (2 wheels) Transfers: Sit to/from Stand Sit to Stand: Min assist           General transfer comment: Stood from EOB, cued for hand placement.     Balance Overall balance assessment: Needs assistance Sitting-balance support: No upper extremity supported, Feet supported Sitting balance-Leahy Scale: Good Sitting balance - Comments: seated EOB - no LOB   Standing balance support: Bilateral upper extremity supported, During functional activity, Reliant on assistive device for balance Standing balance-Leahy Scale: Poor Standing balance comment: reliant on RW and sink vanity for UE support                           ADL either performed or assessed with clinical judgement   ADL Overall ADL's : Needs assistance/impaired     Grooming: Oral care;Contact guard assist;Minimal assistance;Standing Grooming Details (indicate cue type and reason): oral care ceased prematurely, pt became unresponsive standing at the sink requiring to urgently return pt back to supine                                    Extremity/Trunk Assessment              Vision       Perception  Praxis     Communication Communication Communication: Impaired Factors Affecting Communication: Difficulty expressing self;Reduced clarity of speech (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 CVA)   Cognition Arousal: Alert (became unresponsive during OT session) Behavior During Therapy: Flat affect Cognition: History of cognitive impairments, No family/caregiver present to determine baseline             OT - Cognition Comments: pt with flat affect but participatory; anxious  after unresponsive episode at the sink                 Following commands: Impaired Following commands impaired: Follows one step commands inconsistently      Cueing   Cueing Techniques: Verbal cues, Gestural cues, Tactile cues, Visual cues  Exercises      Shoulder Instructions       General Comments Pt VSS at end of session after unresponsive episode. Multiple RN in room to assess patient once in supine, with pt awakening without recollection of unresponsive episode.    Pertinent Vitals/ Pain       Pain Assessment Pain Assessment: No/denies pain Pain Score: 0-No pain  Home Living                                          Prior Functioning/Environment              Frequency  Min 2X/week        Progress Toward Goals  OT Goals(current goals can now be found in the care plan section)  Progress towards OT goals: Progressing toward goals     Plan      Co-evaluation                 AM-PAC OT 6 Clicks Daily Activity     Outcome Measure   Help from another person eating meals?: A Little Help from another person taking care of personal grooming?: A Little Help from another person toileting, which includes using toliet, bedpan, or urinal?: A Lot Help from another person bathing (including washing, rinsing, drying)?: A Little Help from another person to put on and taking off regular upper body clothing?: A Little Help from another person to put on and taking off regular lower body clothing?: A Lot 6 Click Score: 16    End of Session Equipment Utilized During Treatment: Gait belt;Rolling walker (2 wheels)  OT Visit Diagnosis: Unsteadiness on feet (R26.81);Other abnormalities of gait and mobility (R26.89);Other symptoms and signs involving cognitive function;Cognitive communication deficit (R41.841) Symptoms and signs involving cognitive functions: Cerebral infarction   Activity Tolerance Patient tolerated treatment well    Patient Left in bed;with call bell/phone within reach;with nursing/sitter in room;with bed alarm set   Nurse Communication          Time: 9046-8979 OT Time Calculation (min): 27 min  Charges: OT General Charges $OT Visit: 1 Visit OT Treatments $Self Care/Home Management : 23-37 mins  Thula Stewart D., MSOT, OTR/L Acute Rehabilitation Services [phone] Secure Chat Preferred  Courtney Burke 02/28/2024, 11:19 AM

## 2024-02-29 DIAGNOSIS — G40919 Epilepsy, unspecified, intractable, without status epilepticus: Secondary | ICD-10-CM

## 2024-02-29 LAB — BASIC METABOLIC PANEL WITH GFR
Anion gap: 10 (ref 5–15)
BUN: 18 mg/dL (ref 8–23)
CO2: 18 mmol/L — ABNORMAL LOW (ref 22–32)
Calcium: 8.5 mg/dL — ABNORMAL LOW (ref 8.9–10.3)
Chloride: 113 mmol/L — ABNORMAL HIGH (ref 98–111)
Creatinine, Ser: 1.84 mg/dL — ABNORMAL HIGH (ref 0.44–1.00)
GFR, Estimated: 29 mL/min — ABNORMAL LOW (ref >60)
Glucose, Bld: 79 mg/dL (ref 70–99)
Potassium: 4 mmol/L (ref 3.5–5.1)
Sodium: 141 mmol/L (ref 135–145)

## 2024-02-29 MED ORDER — LEVETIRACETAM 1000 MG PO TABS
1000.0000 mg | ORAL_TABLET | Freq: Two times a day (BID) | ORAL | 3 refills | Status: AC
Start: 2024-02-29 — End: ?

## 2024-02-29 MED ORDER — MIDODRINE HCL 5 MG PO TABS
2.5000 mg | ORAL_TABLET | Freq: Two times a day (BID) | ORAL | Status: DC
  Administered 2024-02-29: 2.5 mg via ORAL
  Filled 2024-02-29: qty 1

## 2024-02-29 NOTE — Discharge Summary (Signed)
 Physician Discharge Summary   Patient: Courtney Burke MRN: [id number] DOB: 1955-06-30  Admit date:     02/25/2024  Discharge date: 02/29/24  Discharge Physician: Sabas GORMAN Brod   PCP: [name], MD   Recommendations at discharge:   Follow-up PCP in 1 week Stop amlodipine  Continue using TED hose when out of bed Take high-dose of Keppra  1000 mg p.o. twice a day  Discharge Diagnoses: Principal Problem:   Seizure (HCC) Active Problems:   History of CVA (cerebrovascular accident)   Paroxysmal atrial fibrillation (HCC)   Essential hypertension   History of sleeve gastrectomy   Hyperlipidemia   Dyslipidemia   Stage 3b chronic kidney disease (HCC)   Aphasia following cerebrovascular accident (CVA)  Resolved Problems:   * No resolved hospital problems. 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 Patient presented to the hospital with staring spells. Patient was at home.  Lives with her brother.  Sitting in the chair and suddenly started having staring spell. Daughter at bedside reports that she is having this episode of almost once a month prior to this admission but for last 1 week these have been on a daily basis. This episode starts with bleeding of sweat on her forehead followed by her being not responsive and staring in a void followed by her being confused.    Assessment and Plan:  Seizure disorder with concern for recurrent seizures Mentation appears to be close to baseline right now. No tonic-clonic movement, tongue biting reported. Compliance is reported by the family.  Unknown etiology for now. CT of the head unremarkable. No new focal deficits seen. Neurology following the patient. Received 1 g IV Keppra  loading dose. -Continue Keppra  1000 mg  p.o. twice daily, continue topiramate  100 mg p.o. twice daily -LTM EEG did not show any more seizures -Neurology has signed off -Need home health PT     Prior history of CVA with expressive aphasia. No new focal deficit. CT of the head unremarkable. On Eliquis  at home. . Unable to get an MRI brain. Will continue with baby aspirin  as well. SLP consulted.  Mild aspiration risk.  Started on regular diet with thin liquid.   Orthostatic hypotension. Concern for vasovagal syncope. Systolic blood pressure 111/75 lying, on standing drops down to 89/69. Norvasc  stopped - Blood pressure improved after starting TED hose. -Will give 1 dose of midodrine  2.5 mg p.o. x 1 today   CKD stage IIIb - Creatinine is 1.85, at baseline  Mild hyperchloremic metabolic acidosis -Patient takes KCl at home, will discontinue KCl. -Potassium has been normal. -Check BMP in 1 week at PCP office     Hyperlipidemia On statin. Lipitor  80 mg continue.   Mood disorder. On amitriptyline . Continue.   Paroxysmal A-fib. Currently rate controlled. Not on any rate control medication at home. On Eliquis  at home. Has implantable loop recorder(ILR) Follow-up cardiology as outpatient            Consultants: Neurology Procedures performed: Long-term EEG monitoring Disposition: Home Diet recommendation:  Discharge Diet Orders (From admission, onward)     Start     Ordered   02/29/24 0000  Diet - low sodium heart healthy        02/29/24 1401           Regular diet DISCHARGE MEDICATION:  Allergies as of 02/29/2024       Reactions   Homatropine Itching   Iodinated Contrast Media Itching, Other (See Comments)   Doxycycline Nausea And Vomiting, Other (See Comments)   Sulfa Antibiotics Rash, Hives   Codeine Hives, Swelling   Swollen tongue   Hydrocodone Itching   Hydrocodone Bit-homatrop Mbr Itching   Sulfamethoxazole Hives   Ace Inhibitors Cough, Itching, Rash        Medication List      STOP taking these medications    amLODipine  10 MG tablet Commonly known as: NORVASC    amLODipine  5 MG tablet Commonly known as: NORVASC    potassium chloride  SA 20 MEQ tablet Commonly known as: KLOR-CON  M       TAKE these medications    acetaminophen  500 MG tablet Commonly known as: TYLENOL  Take 1,000 mg by mouth every 6 (six) hours as needed for moderate pain or headache.   amitriptyline  50 MG tablet Commonly known as: ELAVIL  Take 1 tablet (50 mg total) by mouth at bedtime.   aspirin  EC 81 MG tablet Take 1 tablet (81 mg total) by mouth daily. Swallow whole.   atorvastatin  80 MG tablet Commonly known as: Lipitor  Take 1 tablet (80 mg total) by mouth daily.   DULoxetine  30 MG capsule Commonly known as: CYMBALTA  TAKE 1 CAPSULE BY MOUTH EVERY DAY   Eliquis  5 MG Tabs tablet Generic drug: apixaban  TAKE 1 TABLET BY MOUTH TWICE A DAY   levETIRAcetam  1000 MG tablet Commonly known as: KEPPRA  Take 1 tablet (1,000 mg total) by mouth 2 (two) times daily. What changed:  medication strength how much to take   Nurtec 75 MG Tbdp Generic drug: Rimegepant Sulfate TAKE 1 TABLET (75 MG TOTAL) BY MOUTH AS NEEDED.   topiramate  100 MG tablet Commonly known as: TOPAMAX  Take 1 tablet (100 mg total) by mouth 2 (two) times daily. Take 1 tablet twice daily   ursodiol  250 MG tablet Commonly known as: ACTIGALL  Take 250 mg by mouth 2 (two) times daily.   Valtoco  15 MG Dose 2 x 7.5 MG/0.1ML Lqpk Generic drug: diazePAM  (15 MG Dose) Place 15 mg into the nose as needed (seizure rescue). At onset of seizure, may repeat once based on response and tolerability after >=4 hours. Maximum dose: Two doses per episode. Do not use for more than 1 episode every 5 days or more than 5 episodes per month.        Follow-up Information     [facility] Follow up.   Why: [facility] will contact you for the first home visit. Physical and occupational therapy Contact  information: [address] [place] [address] [phone]         [name], MD Follow up.   Specialty: Family Medicine Contact information: [address] [place] [address] [phone]         [name], MD Follow up in 1 week(s).   Specialty: Family Medicine Why: Check BMP in 1 week at PCP office Contact information: [address] [place] [facility] [place] [address] [phone]                Discharge Exam: There were no vitals filed for this visit. Patient is alert, pleasantly confused S1-S2, regular, no murmur auscultated Lungs are clear to auscultation bilaterally  Condition at discharge: good  The results of significant diagnostics from this hospitalization (including imaging, microbiology, ancillary and laboratory) are listed below for reference.  Imaging Studies: Overnight EEG with video Result Date: 02/26/2024 Shelton Arlin KIDD, MD     02/27/2024  9:44 AM Patient Name: Meilin Brosh MRN: [id number] Epilepsy Attending: Arlin KIDD Shelton Referring Physician/Provider: Judithe Rocky BROCKS, NP Duration: 01/25/2024 1656 to 01/26/2024 1730 Patient history: 68 y.o. 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 She was BIB family after what they described as a staring-off spell. She then went unresponsive in a seated position, was moved to her bed by family, EMS called. Per EMS, patient was responsive to verbal stimuli with confusion, appearing postictal. EEG to evaluate for seizure Level of alertness: Awake, asleep AEDs during EEG study: LEV, TPM Technical aspects: This EEG study was done with scalp electrodes positioned according to the 10-20 International system of electrode placement. Electrical activity was reviewed with band pass filter of 1-70Hz , sensitivity of 7 uV/mm, display speed of 83mm/sec  with a 60Hz  notched filter applied as appropriate. EEG data were recorded continuously and digitally stored.  Video monitoring was available and reviewed as appropriate. Description: The posterior dominant rhythm consists of 7.5 Hz activity of moderate voltage (25-35 uV) seen predominantly in posterior head regions, symmetric and reactive to eye opening and eye closing. Sleep was characterized by vertex waves, sleep spindles (12 to 14 Hz), maximal frontocentral region.  There is intermittent generalized 3 to 6 Hz theta-delta slowing. Hyperventilation and photic stimulation were not performed.   EEG was disconnected between 02/25/2024 1814 to 1849 for unclear reasons. ABNORMALITY - Intermittent slow, generalized IMPRESSION: This study is suggestive of mild diffuse encephalopathy. No seizures or epileptiform discharges were seen throughout the recording. Priyanka O Yadav   CT Head Wo Contrast Result Date: 02/25/2024 CLINICAL DATA:  Seizure disorder EXAM: CT HEAD WITHOUT CONTRAST TECHNIQUE: Contiguous axial images were obtained from the base of the skull through the vertex without intravenous contrast. RADIATION DOSE REDUCTION: This exam was performed according to the departmental dose-optimization program which includes automated exposure control, adjustment of the mA and/or kV according to patient size and/or use of iterative reconstruction technique. COMPARISON:  Most recent prior CT scan of the head 07/12/2023 FINDINGS: Brain: No evidence of acute infarction, hemorrhage, hydrocephalus, extra-axial collection or mass lesion/mass effect. Stable encephalomalacia in the left frontal, temporal and parietal lobes extending into the left basal ganglia consistent with remote prior ischemic insult. Vascular: Metal stent present within the left middle cerebral artery. Skull: Normal. Negative for fracture or focal lesion. Sinuses/Orbits: No acute finding. Other: None. IMPRESSION: 1. No acute intracranial abnormality. 2.  Sequelae of prior left MCA territory infarct again noted. 3. Metal stent present in the left middle cerebral artery. Electronically Signed   By: Wilkie Lent M.D.   On: 02/25/2024 14:53   VAS US  ABI WITH/WO TBI Result Date: 02/15/2024  LOWER EXTREMITY DOPPLER STUDY Patient Name:  Chloeann Alfred  Date of Exam:   02/14/2024 Medical Rec #: [id number]             Accession #:    [id number] Date of Birth: [birth year]             Patient Gender: F Patient Age:   29 years Exam Location:  [address] Procedure:      VAS US  ABI WITH/WO TBI Referring Phys: NORMAN SERVE --------------------------------------------------------------------------------  Indications: Reduced pedal pulses High Risk Factors: Prior CVA.  Comparison Study: None. Performing  Technologist: Garnette Rockers  Examination Guidelines: A complete evaluation includes at minimum, Doppler waveform signals and systolic blood pressure reading at the level of bilateral brachial, anterior tibial, and posterior tibial arteries, when vessel segments are accessible. Bilateral testing is considered an integral part of a complete examination. Photoelectric Plethysmograph (PPG) waveforms and toe systolic pressure readings are included as required and additional duplex testing as needed. Limited examinations for reoccurring indications may be performed as noted.  ABI Findings: +---------+------------------+-----+----------+--------+ Right    Rt Pressure (mmHg)IndexWaveform  Comment  +---------+------------------+-----+----------+--------+ Brachial 127                                       +---------+------------------+-----+----------+--------+ PTA      146               1.15 biphasic           +---------+------------------+-----+----------+--------+ DP       141               1.11 monophasic         +---------+------------------+-----+----------+--------+ Great Toe26                0.20 Abnormal            +---------+------------------+-----+----------+--------+ +---------+------------------+-----+--------+-------+ Left     Lt Pressure (mmHg)IndexWaveformComment +---------+------------------+-----+--------+-------+ Brachial 126                                    +---------+------------------+-----+--------+-------+ PTA      158               1.24 biphasic        +---------+------------------+-----+--------+-------+ DP       151               1.19 biphasic        +---------+------------------+-----+--------+-------+ Great Toe83                0.65 Abnormal        +---------+------------------+-----+--------+-------+ +-------+-----------+-----------+------------+------------+ ABI/TBIToday's ABIToday's TBIPrevious ABIPrevious TBI +-------+-----------+-----------+------------+------------+ Right  1.15       0.2                                 +-------+-----------+-----------+------------+------------+ Left   1.24       0.65                                +-------+-----------+-----------+------------+------------+  Summary: Right: Resting right ankle-brachial index is within normal range. The right toe-brachial index is abnormal.  Left: Resting left ankle-brachial index is within normal range. The left toe-brachial index is abnormal. Left toe pressure is >60 mmHg which suggests adequate perfusion for healing. *See table(s) above for measurements and observations.  Electronically signed by Fonda Rim on 02/15/2024 at 8:06:20 AM.    Final     Microbiology: Results for orders placed or performed in visit on 01/01/24  Urine Culture     Status: None   Collection Time: 01/01/24 12:44 PM   Specimen: Urine  Result Value Ref Range Status   MICRO NUMBER: [id number]  Final   SPECIMEN QUALITY: Adequate  Final   Sample Source URINE  Final   STATUS: FINAL  Final   Result:  No Growth  Final    Labs: CBC: Recent Labs  Lab 02/25/24 1225 02/26/24 0600  WBC 4.8 4.2   NEUTROABS 2.8  --   HGB 11.2* 10.7*  HCT 34.7* 32.8*  MCV 85.3 84.5  PLT 310 295   Basic Metabolic Panel: Recent Labs  Lab 02/25/24 1225 02/26/24 0600 02/29/24 0402  NA 139 139 141  K 4.3 4.2 4.0  CL 113* 114* 113*  CO2 17* 18* 18*  GLUCOSE 87 80 79  BUN 17 16 18   CREATININE 1.97* 1.85* 1.84*  CALCIUM  8.5* 8.6* 8.5*   Liver Function Tests: Recent Labs  Lab 02/25/24 1225  AST 17  ALT 10  ALKPHOS 142*  BILITOT 0.6  PROT 6.0*  ALBUMIN 3.1*   CBG: Recent Labs  Lab 02/27/24 2113 02/28/24 0649 02/28/24 1015 02/28/24 1203 02/28/24 1519  GLUCAP 192* 98 109* 134* 87    Discharge time spent: greater than 30 minutes.  Signed: Sabas GORMAN Brod, MD [facility] 02/29/2024

## 2024-02-29 NOTE — Plan of Care (Signed)

## 2024-02-29 NOTE — Progress Notes (Incomplete)
 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 Patient presented to the hospital with staring spells. Patient was at home.  Lives with her brother.  Sitting in the chair and suddenly started having staring spell. Daughter at bedside reports that she is having this episode of almost once a month prior to this admission but for last 1 week these have been on a daily basis. This episode starts with bleeding of sweat on her forehead followed by her being not responsive and staring in a void followed by her being confused.   Assessment/Plan:   Seizure disorder with concern for recurrent seizures Mentation appears to be close to baseline right now. No tonic-clonic movement, tongue biting reported. Compliance is reported by the family.  Unknown etiology for now. CT of the head unremarkable. No new focal deficits seen. Neurology following the patient. Received 1 g IV Keppra  loading dose. -Continue Keppra  1000 mg p.o. twice daily, continue topiramate  100 mg p.o. twice daily -LTM EEG did not show any more seizures -Neurology has signed off -Need home health PT    Prior history of CVA with expressive aphasia. No new focal deficit. CT of the head unremarkable. On Eliquis  at home. . Unable to get an MRI brain. Will continue with baby aspirin  as well. SLP consulted.  Mild aspiration risk.  Started on regular diet with thin liquid.   Orthostatic hypotension. Concern for vasovagal syncope. Systolic blood pressure 111/75 lying, on standing drops down to 89/69. Norvasc  stopped - Will start TED hose, start LR at 75 mL/h for 12 hours - Check orthostatics in  a.m.  CKD stage IIIb - Creatinine is 1.85, at baseline - Follow renal function in a.m.    Hyperlipidemia On statin. Lipitor  80 mg continue.   Mood disorder. On amitriptyline . Continue.   Paroxysmal A-fib. Currently rate controlled. Not on any rate control medication at home. On Eliquis  at home. Has implantable loop recorder(ILR) Will require ILR interrogation. Monitor on telemetry for now.   GERD.   She will continue PPI.    Medications     amitriptyline   50 mg Oral QHS   apixaban   5 mg Oral BID   aspirin  EC  81 mg Oral Daily   atorvastatin   80 mg Oral Daily   DULoxetine   30 mg Oral Daily   levETIRAcetam   1,000 mg Intravenous BID   topiramate   100 mg Oral BID   ursodiol   300 mg Oral BID     Data Reviewed:   CBG:  Recent Labs  Lab 02/27/24 2113 02/28/24 0649 02/28/24 1015 02/28/24 1203 02/28/24 1519  GLUCAP 192* 98 109* 134* 87    SpO2: 100 %    Vitals:   02/28/24 1521 02/28/24 2051 02/29/24 0000 02/29/24 0345  BP: 100/62 105/65 115/66 98/71  Pulse: 72 78 77 73  Resp: 18 18 18 18   Temp: 98.1 F (36.7 C) 97.8 F (36.6 C) 97.9 F (36.6 C) (!) 97.5 F (36.4 C)  TempSrc: Oral Oral Oral Oral  SpO2: 100% 100% 99% 100%      Data Reviewed:  Basic Metabolic Panel: Recent Labs  Lab 02/25/24 1225 02/26/24 0600 02/29/24 0402  NA 139 139 141  K  4.3 4.2 4.0  CL 113* 114* 113*  CO2 17* 18* 18*  GLUCOSE 87 80 79  BUN 17 16 18   CREATININE 1.97* 1.85* 1.84*  CALCIUM  8.5* 8.6* 8.5*    CBC: Recent Labs  Lab 02/25/24 1225 02/26/24 0600  WBC 4.8 4.2  NEUTROABS 2.8  --   HGB 11.2* 10.7*  HCT 34.7* 32.8*  MCV 85.3 84.5  PLT 310 295    LFT Recent Labs  Lab 02/25/24 1225  AST 17  ALT 10  ALKPHOS 142*  BILITOT 0.6  PROT 6.0*  ALBUMIN 3.1*     Antibiotics: Anti-infectives (From admission, onward)    None        DVT prophylaxis: Apixaban   Code Status: Full code  Family Communication: No family at bedside   CONSULTS  neurology   Subjective      Objective    Physical Examination:     Status is: Inpatient:             Courtney Burke   [facility] If 7PM-7AM, please contact night-coverage at [email or web address], Office  [phone]   02/29/2024, 8:06 AM  LOS: 3 days

## 2024-02-29 NOTE — Progress Notes (Signed)
 Pt being d/c.  PIVs removed Discharge information given daughter Bet[place]) Daughter verb understanding and had no further questions  Wheel chaired pt out to vehicle.

## 2024-02-29 NOTE — Plan of Care (Signed)

## 2024-02-29 NOTE — Progress Notes (Signed)
Daughter is on her way to pick up pt.

## 2024-02-29 NOTE — Progress Notes (Deleted)
 Pt called husband to pick her up. Waiting on transportation

## 2024-02-29 NOTE — TOC CM/SW Note (Addendum)
 PT is recommending a RW. Unable to discuss DME with pt due to aphasia. Contacted pt's daughter, [name], left VM regarding the RW. Will continue to f/u to assist with DME if needed.   Spoke with daughter, she reports that pt has a RW.

## 2024-03-02 ENCOUNTER — Telehealth: Payer: Self-pay

## 2024-03-02 NOTE — Transitions of Care (Post Inpatient/ED Visit) (Signed)
   03/02/2024  Name: Courtney Burke MRN: [id number] DOB: 1955-08-10  Today's TOC FU Call Status: Today's TOC FU Call Status:: Unsuccessful Call (1st Attempt) Unsuccessful Call (1st Attempt) Date: 03/02/24  Attempted to reach the patient regarding the most recent Inpatient/ED visit.  Follow Up Plan: Additional outreach attempts will be made to reach the patient to complete the Transitions of Care (Post Inpatient/ED visit) call.  Alan Ee, RN, BSN, CEN [employer]- Transition of Care Team.  [facility] [phone]

## 2024-03-03 ENCOUNTER — Telehealth: Payer: Self-pay

## 2024-03-03 NOTE — Transitions of Care (Post Inpatient/ED Visit) (Signed)
 03/03/2024  Name: Courtney Burke MRN: [id number] DOB: 11/12/1955  Today's TOC FU Call Status: Today's TOC FU Call Status:: Successful TOC FU Call Completed TOC FU Call Complete Date: 03/03/24 Patient's Name and Date of Birth confirmed.  Transition Care Management Follow-up Telephone Call How have you been since you were released from the hospital?: Better (no seizures since hospital discharge) Any questions or concerns?: No  Items Reviewed: Did you receive and understand the discharge instructions provided?: Yes Medications obtained,verified, and reconciled?: Yes (Medications Reviewed) Any new allergies since your discharge?: No Dietary orders reviewed?: Yes Type of Diet Ordered:: Low salt, heart healthy Do you have support at home?: Yes People in Home [RPT]: child(ren), adult, sibling(s) Name of Support/Comfort Primary Source: daughter and brothers  Medications Reviewed Today: Medications Reviewed Today     Reviewed by Rumalda Alan PENNER, RN (Registered Nurse) on 03/03/24 at 1339  Med List Status: <None>   Medication Order Taking? Sig Documenting Provider Last Dose Status Informant  acetaminophen  (TYLENOL ) 500 MG tablet [id number] Yes Take 1,000 mg by mouth every 6 (six) hours as needed for moderate pain or headache. [provider]  Active Child, Pharmacy Records  amitriptyline  (ELAVIL ) 50 MG tablet [id number] Yes Take 1 tablet (50 mg total) by mouth at bedtime. Whitfield Raisin, NP  Active Child, Pharmacy Records  aspirin  EC 81 MG tablet [id number] Yes Take 1 tablet (81 mg total) by mouth daily. Swallow whole. Whitfield Raisin, NP  Active Child, Pharmacy Records  atorvastatin  (LIPITOR ) 80 MG tablet [id number] Yes Take 1 tablet (80 mg total) by mouth daily. [name], MD  Active Child, Pharmacy Records  diazePAM , 15 MG Dose, (VALTOCO  15 MG DOSE) 2 x 7.5 MG/0.1ML LQPK [id number] Yes Place 15 mg into the nose as needed (seizure rescue). At onset of seizure, may repeat once  based on response and tolerability after >=4 hours. Maximum dose: Two doses per episode. Do not use for more than 1 episode every 5 days or more than 5 episodes per month. Whitfield Raisin, NP  Active Child, Pharmacy Records  DULoxetine  (CYMBALTA ) 30 MG capsule [id number] Yes TAKE 1 CAPSULE BY MOUTH EVERY DAY Whitfield Raisin, NP  Active Child, Pharmacy Records  ELIQUIS  5 MG TABS tablet [id number] Yes TAKE 1 TABLET BY MOUTH TWICE A DAY Camnitz, Will Gladis, MD  Active Child, Pharmacy Records  levETIRAcetam  (KEPPRA ) 1000 MG tablet [id number] Yes Take 1 tablet (1,000 mg total) by mouth 2 (two) times daily. Drusilla Sabas RAMAN, MD  Active   NURTEC 75 MG TBDP [id number] Yes TAKE 1 TABLET (75 MG TOTAL) BY MOUTH AS NEEDED. Whitfield Raisin, NP  Active Child, Pharmacy Records  topiramate  (TOPAMAX ) 100 MG tablet [id number] Yes Take 1 tablet (100 mg total) by mouth 2 (two) times daily. Take 1 tablet twice daily Whitfield Raisin, NP  Active Child, Pharmacy Records  ursodiol  (ACTIGALL ) 250 MG tablet [id number] Yes Take 250 mg by mouth 2 (two) times daily. [provider]  Active Child, Pharmacy Records            Home Care and Equipment/Supplies: Were Home Health Services Ordered?: Yes Name of Home Health Agency:: [facility] Has Agency set up a time to come to your home?: Yes First Home Health Visit Date: 03/03/24 Any new equipment or medical supplies ordered?: No  Functional Questionnaire: Do you need assistance with bathing/showering or dressing?: No Do you need assistance with meal preparation?: No Do you need assistance with eating?: No Do you have difficulty maintaining continence: Yes (  occasionally) Do you need assistance with getting out of bed/getting out of a chair/moving?: No Do you have difficulty managing or taking your medications?: Yes (daughter)  Follow up appointments reviewed: PCP Follow-up appointment confirmed?: No (daughter will call today) MD Provider Line [phone] Given:  No Specialist Hospital Follow-up appointment confirmed?: No Reason Specialist Follow-Up Not Confirmed: Patient has Specialist Provider Number and will Call for Appointment Do you need transportation to your follow-up appointment?: No Do you understand care options if your condition(s) worsen?: Yes-patient verbalized understanding  SDOH Interventions Today    Flowsheet Row Most Recent Value  SDOH Interventions   Food Insecurity Interventions Intervention Not Indicated  Housing Interventions Intervention Not Indicated  Transportation Interventions Intervention Not Indicated    Goals       Patient stated (pt-stated)      I want to get my speech back and go back to work and make money.      [facility] Transitions of Care (TOC) Care Plan      Problems:  Recent Hospitalization for treatment of seizures No Hospital Follow Up Provider appointment No Specialist appointment   Goal:  Over the next 30 days, the patient will not experience hospital readmission  Interventions:  Transitions of Care: Doctor Visits  - discussed the importance of doctor visits Reviewed medication changes Reviewed importance of monitoring BP Reviewed importance be fall prevention.  Reviewed importance of safety with seizures Assessed SDOH Assessed speech and swallowing Reviewed and offered 30 day TOC program and daughter has agreed.     Patient Self Care Activities:  Attend all scheduled provider appointments Call pharmacy for medication refills 3-7 days in advance of running out of medications Call provider office for new concerns or questions  Notify RN Care Manager of [facility] call rescheduling needs Participate in Transition of Care Program/Attend TOC scheduled calls Take medications as prescribed    Plan:  Telephone follow up appointment with care management team member scheduled for:  03/10/2024 at noon The patient has been provided with contact information for the care management team and has been advised  to call with any health related questions or concerns.         This note sent to MD.   Alan Ee, RN, BSN, CEN [facility]- Transition of Care Team.  [facility] [phone]

## 2024-03-03 NOTE — Telephone Encounter (Signed)
 Copied from CRM #[id number]. Topic: Clinical - Home Health Verbal Orders >> Mar 03, 2024  3:42 PM Laymon HERO wrote: Caller/Agency: [facility] Callback Number: [phone] Service Requested: Physical Therapy Frequency: 2 week 4; 1 week 2 Any new concerns about the patient? No

## 2024-03-03 NOTE — Telephone Encounter (Signed)
 Reviewed recent ED notes after patient presented with staring episodes.  Completed 24-hour EEG without evidence of seizure activity.  No dosage adjustment to ASMs recommended.  They did note orthostatic hypotension and concern for vasovagal syncope, noted improvement of blood pressure with use of TED hose and advised follow-up with cardiology.  As EEG only completed for 24 hours, would recommend proceeding with 72-hour EEG to further characterize these recurrent spells. Order will be signed and placed in outbox. Thank you.

## 2024-03-04 NOTE — Telephone Encounter (Signed)
 Called and left a VM for [facility], given VO for P.T. per Dr. Mercer

## 2024-03-04 NOTE — Telephone Encounter (Signed)
 EEG order completed and signed. Faxed to [facility], rec'd transmission confirmation.

## 2024-03-05 ENCOUNTER — Ambulatory Visit (INDEPENDENT_AMBULATORY_CARE_PROVIDER_SITE_OTHER): Admitting: Family Medicine

## 2024-03-05 ENCOUNTER — Encounter: Payer: Self-pay | Admitting: Family Medicine

## 2024-03-05 VITALS — BP 84/60 | HR 65 | Temp 98.6°F | Ht 62.0 in | Wt 164.6 lb

## 2024-03-05 DIAGNOSIS — I69320 Aphasia following cerebral infarction: Secondary | ICD-10-CM | POA: Diagnosis not present

## 2024-03-05 DIAGNOSIS — M21371 Foot drop, right foot: Secondary | ICD-10-CM

## 2024-03-05 DIAGNOSIS — N1832 Chronic kidney disease, stage 3b: Secondary | ICD-10-CM

## 2024-03-05 DIAGNOSIS — I951 Orthostatic hypotension: Secondary | ICD-10-CM

## 2024-03-05 DIAGNOSIS — G40909 Epilepsy, unspecified, not intractable, without status epilepticus: Secondary | ICD-10-CM

## 2024-03-05 DIAGNOSIS — Z903 Acquired absence of stomach [part of]: Secondary | ICD-10-CM

## 2024-03-05 DIAGNOSIS — I480 Paroxysmal atrial fibrillation: Secondary | ICD-10-CM

## 2024-03-05 DIAGNOSIS — Z09 Encounter for follow-up examination after completed treatment for conditions other than malignant neoplasm: Secondary | ICD-10-CM

## 2024-03-05 DIAGNOSIS — Z8673 Personal history of transient ischemic attack (TIA), and cerebral infarction without residual deficits: Secondary | ICD-10-CM

## 2024-03-05 NOTE — Progress Notes (Signed)
 Established Patient Office Visit   Subjective  Patient ID: Courtney Burke, female    DOB: 16-Jan-1956  Age: 68 y.o. MRN: [id number]  No chief complaint on file.   Pt is a 68 yo female seen for HFU.  Pt admitted 9/  9/23-9/27/25 for sz d/o with recurrent sz.  Pt presented to ED after having increased episodes of unresponsiveness and staring followed by confusion.  Per d/c summary: Seizure disorder with concern for recurrent seizures. Mentation appears to be close to baseline right now. No tonic-clonic movement, tongue biting reported. Compliance is reported by the family.  Unknown etiology for now. CT of the head unremarkable. No new focal deficits seen. Neurology following the patient. Received 1 g IV Keppra  loading dose. -Continue Keppra  1000 mg p.o. twice daily, continue topiramate  100 mg p.o. twice daily -LTM EEG did not show any more seizures -Neurology has signed off -Need home health PT    Prior history of CVA with expressive aphasia. No new focal deficit. CT of the head unremarkable. On Eliquis  at home. . Unable to get an MRI brain. Will continue with baby aspirin  as well. SLP consulted.  Mild aspiration risk.  Started on regular diet with thin liquid.   Orthostatic hypotension. Concern for vasovagal syncope. Systolic blood pressure 111/75 lying, on standing drops down to 89/69. Norvasc  stopped - Blood pressure improved after starting TED hose. -Will give 1 dose of midodrine  2.5 mg p.o. x 1 today   CKD stage IIIb - Creatinine is 1.85, at baseline   Mild hyperchloremic metabolic acidosis -Patient takes KCl at home, will discontinue KCl. -Potassium has been normal. -Check BMP in 1 week at PCP office   Hyperlipidemia On statin. Lipitor  80 mg continue.   Mood disorder. On amitriptyline . Continue.   Paroxysmal A-fib. Currently rate controlled. Not on any rate control medication at home. On Eliquis  at home. Has implantable loop  recorder(ILR) Follow-up cardiology as outpatient   Pt had another seizure this am.  Rescue nasal spray used.  Pt tired.  Endorses dizziness with standing.  EEG done in Hospital but unsure if will need to be redone as pt pulled out some of the leads. HH came to the house this wk.  Pt working on drinking more. Appetite is ok.    Patient Active Problem List   Diagnosis Date Noted   Seizure (HCC) 02/25/2024   Trigger middle finger of left hand 03/28/2022   Aphasia following cerebrovascular accident (CVA) 03/28/2022   Hypokalemia 11/12/2021   Hypomagnesemia 11/12/2021   Normocytic anemia 11/10/2021   Hyponatremia 11/09/2021   Metabolic acidosis 11/09/2021   Intractable nausea and vomiting 11/08/2021   Paroxysmal atrial fibrillation (HCC) 11/08/2021   History of CVA (cerebrovascular accident) 07/24/2021   Syncope 05/16/2021   Breast lump 05/16/2021   Seizure disorder (HCC) 05/16/2021   CVA (cerebral vascular accident) (HCC) 05/11/2021   Acute cerebral infarction (HCC) 05/10/2021   Dyslipidemia 05/09/2021   Type 2 diabetes mellitus with obesity 05/09/2021   Cerebral infarction due to unspecified occlusion or stenosis of left middle cerebral artery (HCC) 06/30/2020   Gallstones 08/18/2019   Esophagitis 07/20/2019   Erosive gastritis 07/20/2019   Nausea vomiting and diarrhea 07/16/2019   AKI (acute kidney injury) 07/15/2019   Chronic bilateral thoracic back pain 06/05/2018   Large breasts 06/05/2018   Seasonal allergic rhinitis due to pollen 05/09/2017   Microcalcifications of the breast 07/02/2016   Mild intermittent asthma without complication 05/08/2016   Severe single current episode of major  depressive disorder, without psychotic features (HCC) 05/08/2016   Vitamin D  deficiency 05/08/2016   Encounter for health maintenance examination 05/08/2016   Persistent headaches 02/16/2016   Arthralgia of multiple joints 09/28/2015   Age-related nuclear cataract of both eyes 09/28/2015    Asthma 09/28/2015   Essential hypertension 09/28/2015   Essential thrombocytosis (HCC) 09/28/2015   Gastroesophageal reflux disease without esophagitis 09/28/2015   Keratoconjunctivitis sicca of both eyes not specified as Sjogren's 09/28/2015   Status post gastric bypass for obesity 09/28/2015   Stage 3b chronic kidney disease (HCC) 09/28/2015   Esophageal dysphagia 09/28/2015   Hot flashes 09/28/2015   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   IFG (impaired fasting glucose) 09/28/2015   Menopausal disorder 09/28/2015   Morbid obesity with BMI of 40.0-44.9, adult (HCC) 09/28/2015   Non-smoker 09/28/2015   Pelvic pain in female 09/28/2015   Presbyopia of both eyes 09/28/2015   Snoring 09/28/2015   Tinea corporis 09/28/2015   Osteopenia 07/15/2015   Benign hypertension with CKD (chronic kidney disease) stage III (HCC) 08/11/2014   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   Hyperlipidemia 03/16/2013   Past Medical History:  Diagnosis Date   Acute ischemic left MCA stroke (HCC) 06/30/2020   Acute ischemic stroke (HCC)    Acute stroke due to occlusion of left middle cerebral artery (HCC) 06/30/2020   Age-related nuclear cataract of both eyes 09/28/2015   Arthralgia of multiple joints 09/28/2015   Asthma    Borderline diabetes    Cerebrovascular accident (CVA) (HCC)    Chronic bilateral thoracic back pain 06/05/2018   Chronic renal insufficiency, stage 1 08/11/2014   Diabetes mellitus type 2 in obese    Dyslipidemia    Dysphagia, post-stroke    Erosive gastritis 07/20/2019   Formatting of this note might be different from the original. On EGD 07/2019   Esophagitis 07/20/2019   Formatting of this note might be different from the original. Added automatically from request for surgery [id number]  Formatting of this note might be different from the original. On EGD 07/2019   Essential hypertension 09/28/2015   Essential thrombocytosis (HCC) 09/28/2015   Gallstones 08/18/2019   Formatting of  this note might be different from the original. 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   Morbid obesity (HCC) 05/21/2013   Seizures (HCC)    Stage 3b chronic kidney disease (HCC)    Status post gastric bypass for obesity 09/28/2015   Past Surgical History:  Procedure Laterality Date   ANKLE SURGERY     CARPAL TUNNEL RELEASE     carpel tunnel     ESOPHAGOGASTRODUODENOSCOPY (EGD) WITH PROPOFOL  N/A 11/14/2021   Procedure: ESOPHAGOGASTRODUODENOSCOPY (EGD) WITH PROPOFOL ;  Surgeon: Avram Lupita BRAVO, MD;  Location: [facility] ENDOSCOPY;  Service: Gastroenterology;  Laterality: N/A;   IR ANGIO INTRA EXTRACRAN SEL COM CAROTID INNOMINATE BILAT MOD SED  01/17/2021   IR ANGIO VERTEBRAL SEL SUBCLAVIAN INNOMINATE UNI R MOD SED  01/17/2021   IR CT HEAD LTD  06/30/2020   IR INTRA CRAN STENT  06/30/2020   IR PERCUTANEOUS ART THROMBECTOMY/INFUSION INTRACRANIAL INC DIAG ANGIO  06/30/2020   IR RADIOLOGIST EVAL & MGMT  08/26/2020   IR US  GUIDE VASC ACCESS RIGHT  01/17/2021   RADIOLOGY WITH ANESTHESIA  N/A 06/30/2020   Procedure: RADIOLOGY WITH ANESTHESIA;  Surgeon: Radiologist, Medication, MD;  Location: [facility] OR;  Service: Radiology;  Laterality: N/A;   Social History   Tobacco Use   Smoking status: Never   Smokeless tobacco: Never  Vaping Use   Vaping status: Never Used  Substance Use Topics   Alcohol use: No   Drug use: No   Family History  Problem Relation Age of Onset   Stroke Maternal Grandfather    Allergies  Allergen Reactions   Homatropine Itching   Iodinated Contrast Media Itching and Other (See Comments)   Doxycycline Nausea And Vomiting and Other (See Comments)   Sulfa Antibiotics Rash and  Hives   Codeine Hives and Swelling    Swollen tongue   Hydrocodone Itching   Hydrocodone Bit-Homatrop Mbr Itching   Sulfamethoxazole Hives   Ace Inhibitors Cough, Itching and Rash    ROS Negative unless stated above    Objective:     LMP 09/28/2012  BP Readings from Last 3 Encounters:  02/29/24 107/69  02/14/24 100/60  02/14/24 104/70   Wt Readings from Last 3 Encounters:  02/14/24 165 lb 6.4 oz (75 kg)  02/14/24 164 lb (74.4 kg)  02/13/24 164 lb (74.4 kg)      Physical Exam Constitutional:      General: She is not in acute distress.    Appearance: Normal appearance.  HENT:     Head: Normocephalic and atraumatic.     Nose: Nose normal.     Mouth/Throat:     Mouth: Mucous membranes are moist.  Eyes:     General: Lids are normal. Gaze aligned appropriately.     Extraocular Movements: Extraocular movements intact.     Right eye: Normal extraocular motion and no nystagmus.     Left eye: Normal extraocular motion and no nystagmus.     Conjunctiva/sclera: Conjunctivae normal.  Cardiovascular:     Rate and Rhythm: Normal rate and regular rhythm.     Heart sounds: Normal heart sounds. No murmur heard.    No gallop.  Pulmonary:     Effort: Pulmonary effort is normal. No respiratory distress.     Breath sounds: Normal breath sounds. No wheezing, rhonchi or rales.  Abdominal:     General: Bowel sounds are normal.     Palpations: Abdomen is soft.  Skin:    General: Skin is warm and dry.  Neurological:     Mental Status: She is alert and oriented to person, place, and time.     Motor: No weakness.     Gait: Gait abnormal.     Comments: R foot drop.        02/13/2023    4:01 PM 06/29/2022    2:47 PM 03/28/2022   10:17 AM  Depression screen PHQ 2/9  Decreased Interest 0 0 1  Down, Depressed, Hopeless 2 0 1  PHQ - 2 Score 2 0 2  Altered sleeping 2  1  Tired, decreased energy 1  1  Change in appetite 0  0  Feeling bad or failure about yourself  1  0  Trouble  concentrating 0  1  Moving slowly or fidgety/restless 1  1  Suicidal thoughts 0  0  PHQ-9 Score 7  6  Difficult doing work/chores Somewhat difficult  Not difficult at all      02/13/2023    4:02 PM  GAD 7 : Generalized Anxiety Score  Nervous, Anxious, on Edge 2  Control/stop worrying 1  Worry too much - different things 0  Trouble relaxing 0  Restless 1  Easily annoyed or irritable 1  Afraid - awful might happen 1  Total GAD 7 Score 6  Anxiety Difficulty Somewhat difficult     No results found for any visits on 03/05/24.    Assessment & Plan:   Hospital discharge follow-up  Orthostatic hypotension -     Comprehensive metabolic panel with GFR; Future  Seizure disorder (HCC) -     Comprehensive metabolic panel with GFR; Future  Aphasia following cerebrovascular accident (CVA)  Paroxysmal atrial fibrillation (HCC) -     Comprehensive metabolic panel with GFR; Future  History of sleeve gastrectomy -     Comprehensive metabolic panel with GFR; Future  History of CVA (cerebrovascular accident) -     Ambulatory referral to Physical Medicine Rehab  Stage 3b chronic kidney disease (HCC) -     Comprehensive metabolic panel with GFR; Future  Right foot drop -     Ambulatory referral to Physical Medicine Rehab  Hospitalization for breakthrough seizures.  TOC phone call made.  Labs, imaging, notes during hospitalization reviewed.  EEG negative.  Continue increased dose of Keppra  1000 mg twice daily.  Diazepam  nasal spray as needed for seizure rescue.  Continue f/u with Neurology.  Increase fluid intake.  Norvasc  d/c'd during visit.  Monitor bp, consider restarting Norvasc  at 2.5 mg for bp consistently >150/90.   Pt to take her time when moving from one position to the next.  Compression socks.  Afib currently stable.  Continue ASA 81 mg and lipitor  80 mg daily.  Referral to PM&R for residual effects of CVA, R foot drop for possible brace.  Given strict precautions.   Return in  about 4 weeks (around 04/02/2024), or if symptoms worsen or fail to improve.   Clotilda JONELLE Single, MD

## 2024-03-10 ENCOUNTER — Other Ambulatory Visit: Payer: Self-pay

## 2024-03-10 NOTE — Patient Instructions (Signed)
 Visit Information  Thank you for taking time to visit with me today. Please don't hesitate to contact me if I can be of assistance to you before our next scheduled telephone appointment.  Our next appointment is by telephone on 03/17/2024 at noon  Following is a copy of your care plan:   Goals Addressed             This Visit's Progress    [facility] Transitions of Care (TOC) Care Plan       Problems:  Recent Hospitalization for treatment of seizures 03/10/2024  daughter states that patient has had 2 seizures since hospital discharge.  EEG today.  No changes to medications.  No Hospital Follow Up Provider appointment.  03/10/2024 completed this  No Specialist appointment 03/10/2024  neurology appointment scheduled for November.  Goal:  Over the next 30 days, the patient will not experience hospital readmission  Interventions:  Transitions of Care: Doctor Visits  - discussed the importance of doctor visits Reviewed medication changes Reviewed importance of monitoring BP Reviewed importance be fall prevention.  Reviewed importance of safety with seizures Reviewed that patient is less active since hospital discharge. Reviewed changing positions slowly.     Patient Self Care Activities:  Attend all scheduled provider appointments Call pharmacy for medication refills 3-7 days in advance of running out of medications Call provider office for new concerns or questions  Notify RN Care Manager of [facility] call rescheduling needs Participate in Transition of Care Program/Attend TOC scheduled calls Take medications as prescribed    Plan:  Telephone follow up appointment with care management team member scheduled for:  03/17/2024 at noon The patient has been provided with contact information for the care management team and has been advised to call with any health related questions or concerns.         Patient verbalizes understanding of instructions and care plan provided today and  agrees to view in MyChart. Active MyChart status and patient understanding of how to access instructions and care plan via MyChart confirmed with patient.     Telephone follow up appointment with care management team member scheduled for:03/17/2024  Please call the care guide team at [phone] if you need to cancel or reschedule your appointment.   Please call the Suicide and Crisis Lifeline: 988 call the [place]  National Suicide Prevention Lifeline: 801-862-3617 or TTY: 425-225-4737 TTY 548-652-4705) to talk to a trained counselor call 1-800-273-TALK (toll free, 24 hour hotline) call 911 if you are experiencing a Mental Health or Behavioral Health Crisis or need someone to talk to.  Alan Ee, RN, BSN, CEN [employer]- Transition of Care Team.  [facility] [phone]

## 2024-03-10 NOTE — Transitions of Care (Post Inpatient/ED Visit) (Signed)
 Transition of Care week 2  Visit Note  03/10/2024  Name: Courtney Burke MRN: [id number]          DOB: 26-Nov-1955  Situation: Patient enrolled in [facility] 30-day program. Visit completed with daughter by telephone.   Background:   Initial Transition Care Management Follow-up Telephone Call    Past Medical History:  Diagnosis Date   Acute ischemic left MCA stroke (HCC) 06/30/2020   Acute ischemic stroke (HCC)    Acute stroke due to occlusion of left middle cerebral artery (HCC) 06/30/2020   Age-related nuclear cataract of both eyes 09/28/2015   Arthralgia of multiple joints 09/28/2015   Asthma    Borderline diabetes    Cerebrovascular accident (CVA) (HCC)    Chronic bilateral thoracic back pain 06/05/2018   Chronic renal insufficiency, stage 1 08/11/2014   Diabetes mellitus type 2 in obese    Dyslipidemia    Dysphagia, post-stroke    Erosive gastritis 07/20/2019   Formatting of this note might be different from the original. On EGD 07/2019   Esophagitis 07/20/2019   Formatting of this note might be different from the original. Added automatically from request for surgery [id number]  Formatting of this note might be different from the original. On EGD 07/2019   Essential hypertension 09/28/2015   Essential thrombocytosis (HCC) 09/28/2015   Gallstones 08/18/2019   Formatting of this note might be different from the original. 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  2 seizures since hospital discharge. Has had follow up with PCP post admission.  Patient Reported Symptoms: Cognitive Cognitive Status: Unable to Assess (phone call with daughter only)      Neurological Neurological Review of Symptoms: Other: [place] Neurological Symptoms/Conditions [RPT]: daughter reports seizure this am.  daughter reports the 2 seizures that patient has had since hospital discharge are when patient has been up moving around.  Daughter reports that patient is getting and EEG now. Neurological Self-Management Outcome: 4 (good) Neurological Comment: daughter reports that she thinks her mom is afraid of falling and this is why she is not as active.  HEENT HEENT Symptoms Reported: Not assessed      Cardiovascular Cardiovascular Symptoms Reported: No symptoms reported    Respiratory Respiratory Symptoms Reported: No symptoms reported    Endocrine Endocrine Symptoms Reported: No symptoms reported Is patient diabetic?: No Endocrine Self-Management Outcome: 4 (good)  Gastrointestinal Gastrointestinal Symptoms Reported: No symptoms reported      Genitourinary Genitourinary Symptoms Reported: No symptoms reported    Integumentary Integumentary Symptoms Reported: No symptoms reported    Musculoskeletal Musculoskelatal Symptoms Reviewed: Unsteady gait        Psychosocial Psychosocial Symptoms Reported: Not assessed         There were no vitals filed for this visit.  Medications Reviewed Today     Reviewed by Rumalda Alan PENNER, RN (Registered Nurse) on 03/10/24 at 1208  Med List Status: <None>   Medication Order Taking? Sig Documenting Provider Last Dose Status Informant  acetaminophen  (TYLENOL ) 500  MG tablet [id number] Yes Take 1,000 mg by mouth every 6 (six) hours as needed for moderate pain or headache. [provider]  Active Child, Pharmacy Records  amitriptyline  (ELAVIL ) 50 MG tablet [id number] Yes Take 1 tablet (50 mg total) by mouth at bedtime. Whitfield Raisin,  NP  Active Child, Pharmacy Records  aspirin  EC 81 MG tablet [id number] Yes Take 1 tablet (81 mg total) by mouth daily. Swallow whole. Whitfield Raisin, NP  Active Child, Pharmacy Records  atorvastatin  (LIPITOR ) 80 MG tablet [id number] Yes Take 1 tablet (80 mg total) by mouth daily. [name], MD  Active Child, Pharmacy Records  diazePAM , 15 MG Dose, (VALTOCO  15 MG DOSE) 2 x 7.5 MG/0.1ML LQPK [id number] Yes Place 15 mg into the nose as needed (seizure rescue). At onset of seizure, may repeat once based on response and tolerability after >=4 hours. Maximum dose: Two doses per episode. Do not use for more than 1 episode every 5 days or more than 5 episodes per month. Whitfield Raisin, NP  Active Child, Pharmacy Records  DULoxetine  (CYMBALTA ) 30 MG capsule [id number] Yes TAKE 1 CAPSULE BY MOUTH EVERY DAY Whitfield Raisin, NP  Active Child, Pharmacy Records  ELIQUIS  5 MG TABS tablet [id number] Yes TAKE 1 TABLET BY MOUTH TWICE A DAY Camnitz, Will Gladis, MD  Active Child, Pharmacy Records  levETIRAcetam  (KEPPRA ) 1000 MG tablet [id number] Yes Take 1 tablet (1,000 mg total) by mouth 2 (two) times daily. Drusilla Sabas RAMAN, MD  Active   NURTEC 75 MG TBDP [id number] Yes TAKE 1 TABLET (75 MG TOTAL) BY MOUTH AS NEEDED. Whitfield Raisin, NP  Active Child, Pharmacy Records  topiramate  (TOPAMAX ) 100 MG tablet [id number] Yes Take 1 tablet (100 mg total) by mouth 2 (two) times daily. Take 1 tablet twice daily Whitfield Raisin, NP  Active Child, Pharmacy Records  ursodiol  (ACTIGALL ) 250 MG tablet [id number] Yes Take 250 mg by mouth 2 (two) times daily. [provider]  Active Child, Pharmacy Records            Goals       Patient stated (pt-stated)      I want to get my speech back and go back to work and make money.      VBCI Transitions of Care (TOC) Care Plan      Problems:  Recent Hospitalization for treatment of seizures 03/10/2024  daughter states that patient has had 2 seizures since hospital discharge.  EEG  today.  No changes to medications.  No Hospital Follow Up Provider appointment.  03/10/2024 completed this  No Specialist appointment 03/10/2024  neurology appointment scheduled for November.  Goal:  Over the next 30 days, the patient will not experience hospital readmission  Interventions:  Transitions of Care: Doctor Visits  - discussed the importance of doctor visits Reviewed medication changes Reviewed importance of monitoring BP Reviewed importance be fall prevention.  Reviewed importance of safety with seizures Reviewed that patient is less active since hospital discharge. Reviewed changing positions slowly.     Patient Self Care Activities:  Attend all scheduled provider appointments Call pharmacy for medication refills 3-7 days in advance of running out of medications Call provider office for new concerns or questions  Notify RN Care Manager of [facility] call rescheduling needs Participate in Transition of Care Program/Attend TOC scheduled calls Take medications as prescribed    Plan:  Telephone follow up appointment with care management team member scheduled for:  03/17/2024 at noon The patient has been provided with contact information  for the care management team and has been advised to call with any health related questions or concerns.         Recommendation:   Continue Current Plan of Care  Follow Up Plan:   Telephone follow up appointment date/time:  03/17/2024 noon  Alan Ee, RN, BSN, CEN [facility]- Transition of Care Team.  [facility] [phone]

## 2024-03-13 ENCOUNTER — Ambulatory Visit

## 2024-03-13 DIAGNOSIS — G40909 Epilepsy, unspecified, not intractable, without status epilepticus: Secondary | ICD-10-CM

## 2024-03-13 DIAGNOSIS — Z8673 Personal history of transient ischemic attack (TIA), and cerebral infarction without residual deficits: Secondary | ICD-10-CM | POA: Diagnosis not present

## 2024-03-13 DIAGNOSIS — I63411 Cerebral infarction due to embolism of right middle cerebral artery: Secondary | ICD-10-CM

## 2024-03-13 DIAGNOSIS — R9401 Abnormal electroencephalogram [EEG]: Secondary | ICD-10-CM | POA: Diagnosis not present

## 2024-03-13 NOTE — Therapy (Unsigned)
 OUTPATIENT PHYSICAL THERAPY NEURO EVALUATION   Patient Name: Courtney Burke MRN: [id number] DOB:1956/01/06, 68 y.o., female Today's Date: 03/13/2024   PCP: *** REFERRING PROVIDER: ***  END OF SESSION:   Past Medical History:  Diagnosis Date   Acute ischemic left MCA stroke (HCC) 06/30/2020   Acute ischemic stroke (HCC)    Acute stroke due to occlusion of left middle cerebral artery (HCC) 06/30/2020   Age-related nuclear cataract of both eyes 09/28/2015   Arthralgia of multiple joints 09/28/2015   Asthma    Borderline diabetes    Cerebrovascular accident (CVA) (HCC)    Chronic bilateral thoracic back pain 06/05/2018   Chronic renal insufficiency, stage 1 08/11/2014   Diabetes mellitus type 2 in obese    Dyslipidemia    Dysphagia, post-stroke    Erosive gastritis 07/20/2019   Formatting of this note might be different from the original. On EGD 07/2019   Esophagitis 07/20/2019   Formatting of this note might be different from the original. Added automatically from request for surgery [id number]  Formatting of this note might be different from the original. On EGD 07/2019   Essential hypertension 09/28/2015   Essential thrombocytosis (HCC) 09/28/2015   Gallstones 08/18/2019   Formatting of this note might be different from the original. 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   Morbid obesity (HCC) 05/21/2013   Seizures (HCC)    Stage 3b chronic kidney disease (HCC)    Status post gastric bypass for obesity 09/28/2015   Past Surgical History:  Procedure Laterality Date   ANKLE SURGERY     CARPAL TUNNEL RELEASE     carpel tunnel     ESOPHAGOGASTRODUODENOSCOPY  (EGD) WITH PROPOFOL  N/A 11/14/2021   Procedure: ESOPHAGOGASTRODUODENOSCOPY (EGD) WITH PROPOFOL ;  Surgeon: Avram Lupita BRAVO, MD;  Location: [facility] ENDOSCOPY;  Service: Gastroenterology;  Laterality: N/A;   IR ANGIO INTRA EXTRACRAN SEL COM CAROTID INNOMINATE BILAT MOD SED  01/17/2021   IR ANGIO VERTEBRAL SEL SUBCLAVIAN INNOMINATE UNI R MOD SED  01/17/2021   IR CT HEAD LTD  06/30/2020   IR INTRA CRAN STENT  06/30/2020   IR PERCUTANEOUS ART THROMBECTOMY/INFUSION INTRACRANIAL INC DIAG ANGIO  06/30/2020   IR RADIOLOGIST EVAL & MGMT  08/26/2020   IR US  GUIDE VASC ACCESS RIGHT  01/17/2021   RADIOLOGY WITH ANESTHESIA N/A 06/30/2020   Procedure: RADIOLOGY WITH ANESTHESIA;  Surgeon: Radiologist, Medication, MD;  Location: [facility] OR;  Service: Radiology;  Laterality: N/A;   Patient Active Problem List   Diagnosis Date Noted   Seizure (HCC) 02/25/2024   Trigger middle finger of left hand 03/28/2022   Aphasia following cerebrovascular accident (CVA) 03/28/2022   Hypokalemia 11/12/2021   Hypomagnesemia 11/12/2021   Normocytic anemia 11/10/2021   Hyponatremia 11/09/2021   Metabolic acidosis 11/09/2021   Intractable nausea and vomiting 11/08/2021   Paroxysmal atrial fibrillation (HCC) 11/08/2021   History of CVA (cerebrovascular accident) 07/24/2021   Syncope 05/16/2021   Breast lump 05/16/2021   Seizure disorder (HCC) 05/16/2021   CVA (cerebral vascular accident) (HCC) 05/11/2021   Acute cerebral infarction (HCC) 05/10/2021   Dyslipidemia 05/09/2021   Type 2 diabetes mellitus with obesity 05/09/2021   Cerebral infarction due to unspecified occlusion or stenosis of left middle cerebral artery (  HCC) 06/30/2020   Gallstones 08/18/2019   Esophagitis 07/20/2019   Erosive gastritis 07/20/2019   Nausea vomiting and diarrhea 07/16/2019   AKI (acute kidney injury) 07/15/2019   Chronic bilateral thoracic back pain 06/05/2018   Large breasts 06/05/2018   Seasonal allergic rhinitis due to pollen 05/09/2017    Microcalcifications of the breast 07/02/2016   Mild intermittent asthma without complication 05/08/2016   Severe single current episode of major depressive disorder, without psychotic features (HCC) 05/08/2016   Vitamin D  deficiency 05/08/2016   Encounter for health maintenance examination 05/08/2016   Persistent headaches 02/16/2016   Arthralgia of multiple joints 09/28/2015   Age-related nuclear cataract of both eyes 09/28/2015   Asthma 09/28/2015   Essential hypertension 09/28/2015   Essential thrombocytosis (HCC) 09/28/2015   Gastroesophageal reflux disease without esophagitis 09/28/2015   Keratoconjunctivitis sicca of both eyes not specified as Sjogren's 09/28/2015   Status post gastric bypass for obesity 09/28/2015   Stage 3b chronic kidney disease (HCC) 09/28/2015   Esophageal dysphagia 09/28/2015   Hot flashes 09/28/2015   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  Rationale for Evaluation and Treatment: {HABREHAB:27488}  SUBJECTIVE:                                                                                                                                                                                             SUBJECTIVE STATEMENT: *** Pt accompanied by: {accompnied:27141}  PERTINENT HISTORY: ***  PAIN:  Are you having pain? {OPRCPAIN:27236}  PRECAUTIONS: {Therapy precautions:24002}  RED FLAGS: {PT Red  Flags:29287}   WEIGHT BEARING RESTRICTIONS: {Yes ***/No:24003}  FALLS: Has patient fallen in last 6 months? {fallsyesno:27318}  LIVING ENVIRONMENT: Lives with: {OPRC lives with:25569::lives with their family} Lives in: {Lives in:25570} Stairs: {opstairs:27293} Has following equipment at home: {Assistive devices:23999}  PLOF: {PLOF:24004}  PATIENT GOALS: ***  OBJECTIVE:  Note: Objective measures were completed at Evaluation unless otherwise noted.  DIAGNOSTIC FINDINGS: ***  COGNITION: Overall cognitive status: {cognition:24006}   SENSATION: {sensation:27233}  COORDINATION: ***  EDEMA:  {edema:24020}  MUSCLE TONE: {LE tone:25568}  MUSCLE LENGTH: Hamstrings: Right ***  deg; Left *** deg Debby test: Right *** deg; Left *** deg  DTRs:  {DTR SITE:24025}  POSTURE: {posture:25561}  LOWER EXTREMITY ROM:     {AROM/PROM:27142}  Right Eval Left Eval  Hip flexion    Hip extension    Hip abduction    Hip adduction    Hip internal rotation    Hip external rotation    Knee flexion    Knee extension    Ankle dorsiflexion    Ankle plantarflexion    Ankle inversion    Ankle eversion     (Blank rows = not tested)  LOWER EXTREMITY MMT:    MMT Right Eval Left Eval  Hip flexion    Hip extension    Hip abduction    Hip adduction    Hip internal rotation    Hip external rotation    Knee flexion    Knee extension    Ankle dorsiflexion    Ankle plantarflexion    Ankle inversion    Ankle eversion    (Blank rows = not tested)  BED MOBILITY:  {bed mobility:32615:p}  TRANSFERS: {transfers eval:32620}  RAMP:  {ramp eval:32616}  CURB:  {curb eval:32617}  STAIRS: {stairs eval:32618} GAIT: Findings: {GaitneuroPT:32644::Distance walked: ***,Comments: ***}  FUNCTIONAL TESTS:  {Functional tests:24029}  PATIENT SURVEYS:  {rehab surveys:24030}                                                                                                                               TREATMENT DATE: ***    PATIENT EDUCATION: Education details: *** Person educated: {Person educated:25204} Education method: {Education Method:25205} Education comprehension: {Education Comprehension:25206}  HOME EXERCISE PROGRAM: ***  GOALS: Goals reviewed with patient? {yes/no:20286}  SHORT TERM GOALS: Target date: ***  *** Baseline: Goal status: INITIAL  2.  *** Baseline:  Goal status: INITIAL  3.  *** Baseline:  Goal status: INITIAL  4.  *** Baseline:  Goal status: INITIAL  5.  *** Baseline:  Goal status: INITIAL  6.  *** Baseline:  Goal status: INITIAL  LONG TERM GOALS: Target date: ***  *** Baseline:  Goal status: INITIAL  2.  *** Baseline:  Goal status: INITIAL  3.  *** Baseline:  Goal status: INITIAL  4.  *** Baseline:  Goal status: INITIAL  5.  *** Baseline:  Goal status: INITIAL  6.  *** Baseline:  Goal status: INITIAL  ASSESSMENT:  CLINICAL IMPRESSION: Patient is a *** y.o. *** who was seen today for physical therapy evaluation and treatment for ***.   OBJECTIVE IMPAIRMENTS: {opptimpairments:25111}.   ACTIVITY LIMITATIONS: {activitylimitations:27494}  PARTICIPATION LIMITATIONS: {participationrestrictions:25113}  PERSONAL FACTORS: {Personal factors:25162} are also affecting patient's functional outcome.   REHAB POTENTIAL: {rehabpotential:25112}  CLINICAL DECISION MAKING: {clinical decision making:25114}  EVALUATION COMPLEXITY: {Evaluation complexity:25115}  PLAN:  PT FREQUENCY: {rehab frequency:25116}  PT DURATION: {rehab duration:25117}  PLANNED INTERVENTIONS: {rehab planned interventions:25118::97110-Therapeutic exercises,97530- Therapeutic [id number]- Neuromuscular re-education,97535- Self Rjmz,02859- Manual therapy,Patient/Family education}  PLAN FOR NEXT  SESSION: ***   Greig LITTIE Credit, PT 03/13/2024, 11:15 AM       [place] [facility]  at [facility] [address]  [address] [place], [place], [address] Phone: [phone]   Fax:  [phone]  Patient Details  Name: Courtney Burke MRN: [id number] Date of Birth: [birth year] Referring Provider:  [name], MD  Encounter Date: 03/13/2024   Greig LITTIE Credit, PT 03/13/2024, 11:15 AM  [place] [facility] at [facility] [address]  [address] [place], [place], [address] Phone: [phone]   Fax:  [phone]

## 2024-03-17 ENCOUNTER — Other Ambulatory Visit: Payer: Self-pay

## 2024-03-19 ENCOUNTER — Telehealth: Payer: Self-pay

## 2024-03-19 NOTE — Transitions of Care (Post Inpatient/ED Visit) (Signed)
 Transition of Care week 3  Visit Note  03/19/2024  Name: Courtney Burke MRN: [id number]          DOB: 1956/03/04  Situation: [facility]. Visit completed with daughter,   Hollenbach,Lakisha by telephone.   Background:   Initial Transition Care Management Follow-up Telephone Call Discharge Date and Diagnosis: 02/29/24, Breakthrough seizures   Past Medical History:  Diagnosis Date   Acute ischemic left MCA stroke (HCC) 06/30/2020   Acute ischemic stroke (HCC)    Acute stroke due to occlusion of left middle cerebral artery (HCC) 06/30/2020   Age-related nuclear cataract of both eyes 09/28/2015   Arthralgia of multiple joints 09/28/2015   Asthma    Borderline diabetes    Cerebrovascular accident (CVA) (HCC)    Chronic bilateral thoracic back pain 06/05/2018   Chronic renal insufficiency, stage 1 08/11/2014   Diabetes mellitus type 2 in obese    Dyslipidemia    Dysphagia, post-stroke    Erosive gastritis 07/20/2019   Formatting of this note might be different from the original. On EGD 07/2019   Esophagitis 07/20/2019   Formatting of this note might be different from the original. Added automatically from request for surgery [id number]  Formatting of this note might be different from the original. On EGD 07/2019   Essential hypertension 09/28/2015   Essential thrombocytosis (HCC) 09/28/2015   Gallstones 08/18/2019   Formatting of this note might be different from the original. 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 Daughter reports no seizures this week.  Daughter reports that patient is staying in the bed a lot.  Not eating well but drinking well. No new concerns today.  Patient Reported Symptoms: Cognitive Cognitive Status: Unable to Assess (phone call with daughter only)      Neurological Neurological Review of Symptoms: Headaches Oher Neurological Symptoms/Conditions [RPT]: daughter reports no seizures this week. Continues to have headaches.  Daughter reports she has not gotten EEG result yet,    HEENT HEENT Symptoms Reported: Not assessed      Cardiovascular Cardiovascular Symptoms Reported: No symptoms reported    Respiratory Respiratory Symptoms Reported: No symptoms reported    Endocrine Endocrine Symptoms Reported: No symptoms reported Is patient diabetic?: No Endocrine Self-Management Outcome: 5 (very good)  Gastrointestinal Gastrointestinal Symptoms Reported: No symptoms reported      Genitourinary Genitourinary Symptoms Reported: No symptoms reported    Integumentary Integumentary Symptoms Reported: No symptoms reported    Musculoskeletal Musculoskelatal Symptoms Reviewed: Unsteady gait Additional Musculoskeletal Details: continues to go to PT        Psychosocial Psychosocial Symptoms Reported: Not assessed         Vitals:   03/19/24 0945  BP: (!) 110/98    Medications Reviewed Today     Reviewed by Rumalda Alan PENNER, RN (Registered Nurse) on 03/19/24 at [phone]  Med List Status: <None>   Medication Order Taking? Sig Documenting Provider Last Dose Status Informant  acetaminophen  (TYLENOL ) 500 MG  tablet [id number]  Take 1,000 mg by mouth every 6 (six) hours as needed for moderate pain or headache. [provider]  Active Child, Pharmacy Records  amitriptyline  (ELAVIL ) 50 MG tablet [id number]  Take 1 tablet (50 mg total) by mouth at bedtime. Whitfield Raisin, NP  Active Child,  Pharmacy Records  aspirin  EC 81 MG tablet [id number]  Take 1 tablet (81 mg total) by mouth daily. Swallow whole. Whitfield Raisin, NP  Active Child, Pharmacy Records  atorvastatin  (LIPITOR ) 80 MG tablet [id number]  Take 1 tablet (80 mg total) by mouth daily. [name], MD  Active Child, Pharmacy Records  diazePAM , 15 MG Dose, (VALTOCO  15 MG DOSE) 2 x 7.5 MG/0.1ML LQPK [id number]  Place 15 mg into the nose as needed (seizure rescue). At onset of seizure, may repeat once based on response and tolerability after >=4 hours. Maximum dose: Two doses per episode. Do not use for more than 1 episode every 5 days or more than 5 episodes per month. Whitfield Raisin, NP  Active Child, Pharmacy Records  DULoxetine  (CYMBALTA ) 30 MG capsule [id number]  TAKE 1 CAPSULE BY MOUTH EVERY DAY Whitfield Raisin, NP  Active Child, Pharmacy Records  ELIQUIS  5 MG TABS tablet [id number]  TAKE 1 TABLET BY MOUTH TWICE A DAY Camnitz, Will Gladis, MD  Active Child, Pharmacy Records  levETIRAcetam  (KEPPRA ) 1000 MG tablet [id number]  Take 1 tablet (1,000 mg total) by mouth 2 (two) times daily. Drusilla Sabas RAMAN, MD  Active   NURTEC 75 MG TBDP [id number]  TAKE 1 TABLET (75 MG TOTAL) BY MOUTH AS NEEDED. Whitfield Raisin, NP  Active Child, Pharmacy Records  topiramate  (TOPAMAX ) 100 MG tablet [id number]  Take 1 tablet (100 mg total) by mouth 2 (two) times daily. Take 1 tablet twice daily Whitfield Raisin, NP  Active Child, Pharmacy Records  ursodiol  (ACTIGALL ) 250 MG tablet [id number]  Take 250 mg by mouth 2 (two) times daily. [provider]  Active Child, Pharmacy Records            Goals Addressed             This Visit's Progress    [facility] Transitions of Care (TOC) Care Plan       Problems:  Recent Hospitalization for treatment of seizures 03/10/2024  daughter states that patient has had 2 seizures since hospital discharge.  EEG today.  No changes to medications. 03/19/2024 No seizures this week.  Taking medications as prescribed.   Less motivated when daughter is around. Not eating well.  Drinking well. Continues to have difficulty speaking.  Continues to have PT.  No Hospital Follow Up Provider appointment.  03/10/2024 completed this  No Specialist appointment 03/10/2024  neurology appointment scheduled for November.  Goal:  Over the next 30 days, the patient will not experience hospital readmission  Interventions:  Transitions of Care: Doctor Visits  - discussed the importance of doctor visits Reviewed medication- no changes Reviewed importance of monitoring BP- 110/98 Reviewed importance of fall prevention.  Reviewed importance of safety with seizures- no seizures this week Reviewed that patient is less active since hospital discharge- daughter reports that she is staying in the bed more now.  Reviewed changing positions slowly.     Patient Self Care Activities:  Attend all scheduled provider appointments Call pharmacy for medication refills 3-7 days in advance of running out of medications Call provider office for new concerns or questions  Notify RN Care Manager of [facility] call rescheduling needs Participate in Transition of Care  Program/Attend TOC scheduled calls Take medications as prescribed    Plan:  Telephone follow up appointment with care management team member scheduled for:  03/26/2024 at 10am The patient has been provided with contact information for the care management team and has been advised to call with any health related questions or concerns.         Recommendation:   Continue Current Plan of Care  Follow Up Plan:   Telephone follow up appointment date/time:  03/26/2024 at 10 am  Alan Ee, RN, BSN, [employer]- Transition of Care Team.  [facility] [phone]

## 2024-03-19 NOTE — Patient Instructions (Signed)
 Visit Information  Thank you for taking time to visit with me today. Please don't hesitate to contact me if I can be of assistance to you before our next scheduled telephone appointment.  Our next appointment is by telephone on 03/26/2024 at 10 am  Following is a copy of your care plan:   Goals Addressed             This Visit's Progress    [facility] Transitions of Care (TOC) Care Plan       Problems:  Recent Hospitalization for treatment of seizures 03/10/2024  daughter states that patient has had 2 seizures since hospital discharge.  EEG today.  No changes to medications. 03/19/2024 No seizures this week.  Taking medications as prescribed.  Less motivated when daughter is around. Not eating well.  Drinking well. Continues to have difficulty speaking.  Continues to have PT.  No Hospital Follow Up Provider appointment.  03/10/2024 completed this  No Specialist appointment 03/10/2024  neurology appointment scheduled for November.  Goal:  Over the next 30 days, the patient will not experience hospital readmission  Interventions:  Transitions of Care: Doctor Visits  - discussed the importance of doctor visits Reviewed medication- no changes Reviewed importance of monitoring BP- 110/98 Reviewed importance of fall prevention.  Reviewed importance of safety with seizures- no seizures this week Reviewed that patient is less active since hospital discharge- daughter reports that she is staying in the bed more now.  Reviewed changing positions slowly.     Patient Self Care Activities:  Attend all scheduled provider appointments Call pharmacy for medication refills 3-7 days in advance of running out of medications Call provider office for new concerns or questions  Notify RN Care Manager of TOC call rescheduling needs Participate in Transition of Care Program/Attend TOC scheduled calls Take medications as prescribed    Plan:  Telephone follow up appointment with care management team  member scheduled for:  03/26/2024 at 10am The patient has been provided with contact information for the care management team and has been advised to call with any health related questions or concerns.         Patient verbalizes understanding of instructions and care plan provided today and agrees to view in MyChart. Active MyChart status and patient understanding of how to access instructions and care plan via MyChart confirmed with patient.     Telephone follow up appointment with care management team member scheduled for:  Please call the care guide team at [phone] if you need to cancel or reschedule your appointment.   Please call the Suicide and Crisis Lifeline: 988 call the [place]  National Suicide Prevention Lifeline: 320-866-9816 or TTY: 319 012 0510 TTY 775-115-2820) to talk to a trained counselor call 1-800-273-TALK (toll free, 24 hour hotline) call 911 if you are experiencing a Mental Health or Behavioral Health Crisis or need someone to talk to.  Alan Ee, RN, BSN, CEN [employer]- Transition of Care Team.  [facility] [phone]

## 2024-03-23 ENCOUNTER — Encounter (INDEPENDENT_AMBULATORY_CARE_PROVIDER_SITE_OTHER): Admitting: Neurology

## 2024-03-23 DIAGNOSIS — R4189 Other symptoms and signs involving cognitive functions and awareness: Secondary | ICD-10-CM

## 2024-03-23 DIAGNOSIS — I69398 Other sequelae of cerebral infarction: Secondary | ICD-10-CM

## 2024-03-23 DIAGNOSIS — G40909 Epilepsy, unspecified, not intractable, without status epilepticus: Secondary | ICD-10-CM

## 2024-03-23 DIAGNOSIS — I63411 Cerebral infarction due to embolism of right middle cerebral artery: Secondary | ICD-10-CM

## 2024-03-23 DIAGNOSIS — G40919 Epilepsy, unspecified, intractable, without status epilepticus: Secondary | ICD-10-CM

## 2024-03-23 DIAGNOSIS — E876 Hypokalemia: Secondary | ICD-10-CM

## 2024-03-23 NOTE — Procedures (Signed)
 Patient Name: Courtney Burke  MRN: [id number]  Referring Physician/Provider: [facility] Study start date: 03/10/2024 at 1154 AM Study end date: 03/13/2024 at 0848AM Duration: 69 hours    Clinical History:  This is a 68 y/o F being seen for stroke, seizure, and headaches. June 2025 syncopal event with possible breakthrough seizure in setting of hypokalemia and prescribed potassium. 8/31 daughter reported seizure described as staring, verbally unresponsive, then, started gagging like she was going to throw up. Daughter laid her on her side and patient started coming to, initially combative as she thought daughter was trying to 'attack', she returned back to baseline w/o postictal symptoms, daughter believes duration was . 8/22 repeat lab work showed persistent hypokalemia (2.6). Daughter reports increase in agitation and episodes of paranoia, easily losing items and blaming family members. Other staring episodes described as head turning left and eyes fluttering, and nonresponsive-duration 60sec.   INTERMITTENT MONITORING with VIDEO TECHNICAL SUMMARY:  This AVEEG was performed using equipment provided by [employer] utilizing Bluetooth ( Trackit ) amplifiers with continuous EEGT attended video collection using encrypted remote transmission via [employer] secured cellular tower network with data rates for each AVEEG performed. This is a Therapist, music AVEEG, obtained, according to the 10-20 international electrode placement system, reformatted digitally into referential and bipolar montages. Data was acquired with a minimum of 21 bipolar connections and sampled at a minimum rate of 250 cycles per second per channel, maximum rate of 450 cycles per second per channel and two channels for EKG. The entire VEEG study was recorded through cable and or radio telemetry for subsequent analysis. Specified epochs of the AVEEG data were identified at the direction of the subject by the depression of a  push button by the patient. Each patients event file included data acquired two minutes prior to the push button activation and continuing until two minutes afterwards. AVEEG files were reviewed on [employer] server, Licensed Software provided by [employer] with a digital high frequency filter set at 70 Hz and a low frequency filter set at 1 Hz with a paper speed of 64mm/s resulting in 10 seconds per digital page. This entire AVEEG was reviewed by the EEG Technologist. Random time samples, random sleep samples, clips, patient initiated push button files with included patient daily diary logs, EEG Technologist pruned data was reviewed and verified for accuracy and validity by the governing reading neurologist in full details. This AEEGV was fully compliant with all requirements for CPT 97500 for setup, patient education, take down and administered by an EEG technologist.   Long-Term EEG with Video was monitored intermittently by a qualified EEG technologist for the entirety of the recording; quality check-ins were performed at a minimum of every two hours, checking and documenting real-time data and video to assure the integrity and quality of the recording (e.g., camera position, electrode integrity and impedance), and identify the need for maintenance. For intermittent monitoring, an EEG Technologist monitored no more than 12 patients concurrently. Diagnostic video was captured at least 80% of the time during the recording.   PATIENT EVENTS:  There were no patient events noted or captured during this recording.   TECHNOLOGIST EVENTS:  No clear epileptiform activity was detected by the reviewing neurodiagnostic technologist for further review.   TIME SAMPLES:  10-minutes of every 2 hours recorded are reviewed as random time samples.   SLEEP SAMPLES:  5-minutes of every 24 hours recorded are reviewed as random sleep samples.   AWAKE:  At maximal  level of alertness, the posterior  dominant background activity was continuous, reactive, low voltage rhythm of 5-7 Hz. This was symmetric, well-modulated, and attenuated with eye opening. Diffuse, symmetric, frontocentral beta range activity was present. There was additional intermittent left temporal focal slowing   SLEEP:  N1 Sleep (Stage 1) was observed and characterized by the disappearance of alpha rhythm and the appearance of vertex activity.   N2 Sleep (Stage 2) was observed and characterized by vertex waves, K-complexes, and sleep spindles.   N3 (Stage 3) sleep was observed and characterized by high amplitude Delta activity of 20%.   REM sleep was observed.   EKG:  There were no arrhythmias or abnormalities noted during this recording.   Impression:  Abnormal EEG due to presence of:  1. Intermittent left temporal focal slowing  2. Mild diffuse slowing    Clinical Correlation:  This 3-day ambulatory EEG tracing is suggestive of a generalized brain dysfunction, such as encephalopathy, nonspecific etiology. There is additional neuronal dysfunction in the left temporal region. No epileptiform discharges were seen. There were no electrographic seizures noted. No events were captured during the recording.   Girtrude Enslin, MD [facility]

## 2024-03-26 ENCOUNTER — Telehealth: Payer: Self-pay

## 2024-03-30 ENCOUNTER — Encounter

## 2024-03-31 ENCOUNTER — Encounter

## 2024-04-14 NOTE — Progress Notes (Signed)
 Courtney Burke                                          MRN: [id number]   04/14/2024   The [facility]BC[facility] Quality Team Specialist reviewed this patient medical record for the purposes of chart review for care gap closure. The following were reviewed: abstraction for care gap closure-glycemic status assessment.    VBCI Quality Team

## 2024-05-02 ENCOUNTER — Encounter

## 2024-05-11 ENCOUNTER — Telehealth: Payer: Self-pay

## 2024-05-11 ENCOUNTER — Ambulatory Visit: Admitting: Adult Health

## 2024-05-11 NOTE — Telephone Encounter (Signed)
 Hello Prescriber!  We are in the process of submitting a prior authorization for your patient for Valtoco . We are reaching out for clinical guidance for the following information to complete the request: Plan requiring documentation of clinical benefit since starting therapy.   Thank you! Pharmacy Team

## 2024-05-13 NOTE — Telephone Encounter (Signed)
 I contacted pt, spoke to daughter per DPR, regarding PA for Valtaco. Advised we need documentation that she is receiving positive benefit  from mediation. She stated she will discuss with mom and respond via [facility]

## 2024-05-18 ENCOUNTER — Emergency Department (HOSPITAL_COMMUNITY)
Admission: EM | Admit: 2024-05-18 | Discharge: 2024-05-19 | Disposition: A | Discharge status: Home / Self Care | Attending: Emergency Medicine | Admitting: Emergency Medicine

## 2024-05-18 ENCOUNTER — Ambulatory Visit: Admitting: Family Medicine

## 2024-05-18 ENCOUNTER — Other Ambulatory Visit: Payer: Self-pay

## 2024-05-18 ENCOUNTER — Encounter (HOSPITAL_COMMUNITY): Payer: Self-pay

## 2024-05-18 VITALS — HR 105 | Temp 98.4°F | Ht 62.0 in | Wt 146.8 lb

## 2024-05-18 DIAGNOSIS — G40909 Epilepsy, unspecified, not intractable, without status epilepticus: Secondary | ICD-10-CM

## 2024-05-18 DIAGNOSIS — E860 Dehydration: Secondary | ICD-10-CM

## 2024-05-18 DIAGNOSIS — Z724 Inappropriate diet and eating habits: Secondary | ICD-10-CM

## 2024-05-18 DIAGNOSIS — R1084 Generalized abdominal pain: Secondary | ICD-10-CM

## 2024-05-18 DIAGNOSIS — R1024 Suprapubic pain: Secondary | ICD-10-CM | POA: Diagnosis not present

## 2024-05-18 DIAGNOSIS — R112 Nausea with vomiting, unspecified: Secondary | ICD-10-CM

## 2024-05-18 DIAGNOSIS — Z7982 Long term (current) use of aspirin: Secondary | ICD-10-CM | POA: Diagnosis not present

## 2024-05-18 DIAGNOSIS — N189 Chronic kidney disease, unspecified: Secondary | ICD-10-CM | POA: Diagnosis not present

## 2024-05-18 DIAGNOSIS — Z7901 Long term (current) use of anticoagulants: Secondary | ICD-10-CM | POA: Diagnosis not present

## 2024-05-18 LAB — URINALYSIS, ROUTINE W REFLEX MICROSCOPIC
Bacteria, UA: NONE SEEN
Bilirubin Urine: NEGATIVE
Glucose, UA: NEGATIVE mg/dL
Hgb urine dipstick: NEGATIVE
Ketones, ur: 5 mg/dL — AB
Nitrite: NEGATIVE
Protein, ur: 30 mg/dL — AB
Specific Gravity, Urine: 1.015 (ref 1.005–1.030)
pH: 5 (ref 5.0–8.0)

## 2024-05-18 LAB — CBC WITH DIFFERENTIAL/PLATELET
Abs Immature Granulocytes: 0.09 K/uL — ABNORMAL HIGH (ref 0.00–0.07)
Basophils Absolute: 0.1 K/uL (ref 0.0–0.1)
Basophils Relative: 1 %
Eosinophils Absolute: 0.1 K/uL (ref 0.0–0.5)
Eosinophils Relative: 1 %
HCT: 40.3 % (ref 36.0–46.0)
Hemoglobin: 13.6 g/dL (ref 12.0–15.0)
Immature Granulocytes: 1 %
Lymphocytes Relative: 26 %
Lymphs Abs: 2.4 K/uL (ref 0.7–4.0)
MCH: 28.6 pg (ref 26.0–34.0)
MCHC: 33.7 g/dL (ref 30.0–36.0)
MCV: 84.8 fL (ref 80.0–100.0)
Monocytes Absolute: 0.7 K/uL (ref 0.1–1.0)
Monocytes Relative: 8 %
Neutro Abs: 5.9 K/uL (ref 1.7–7.7)
Neutrophils Relative %: 63 %
Platelets: 748 K/uL — ABNORMAL HIGH (ref 150–400)
RBC: 4.75 MIL/uL (ref 3.87–5.11)
RDW: 14.1 % (ref 11.5–15.5)
WBC: 9.3 K/uL (ref 4.0–10.5)
nRBC: 0 % (ref 0.0–0.2)

## 2024-05-18 LAB — COMPREHENSIVE METABOLIC PANEL WITH GFR
ALT: 8 U/L (ref 0–44)
AST: 18 U/L (ref 15–41)
Albumin: 3.5 g/dL (ref 3.5–5.0)
Alkaline Phosphatase: 180 U/L — ABNORMAL HIGH (ref 38–126)
Anion gap: 14 (ref 5–15)
BUN: 24 mg/dL — ABNORMAL HIGH (ref 8–23)
CO2: 18 mmol/L — ABNORMAL LOW (ref 22–32)
Calcium: 8.9 mg/dL (ref 8.9–10.3)
Chloride: 102 mmol/L (ref 98–111)
Creatinine, Ser: 2.09 mg/dL — ABNORMAL HIGH (ref 0.44–1.00)
GFR, Estimated: 25 mL/min — ABNORMAL LOW (ref >60)
Glucose, Bld: 168 mg/dL — ABNORMAL HIGH (ref 70–99)
Potassium: 3.6 mmol/L (ref 3.5–5.1)
Sodium: 134 mmol/L — ABNORMAL LOW (ref 135–145)
Total Bilirubin: 0.9 mg/dL (ref 0.0–1.2)
Total Protein: 7.1 g/dL (ref 6.5–8.1)

## 2024-05-18 LAB — POC URINALSYSI DIPSTICK (AUTOMATED)
Bilirubin, UA: NEGATIVE
Blood, UA: NEGATIVE
Glucose, UA: NEGATIVE
Ketones, UA: POSITIVE — AB
Nitrite, UA: NEGATIVE
Protein, UA: POSITIVE — AB
Spec Grav, UA: 1.015 (ref 1.010–1.025)
Urobilinogen, UA: 0.2 U/dL
pH, UA: 5.5 (ref 5.0–8.0)

## 2024-05-18 LAB — LIPASE, BLOOD: Lipase: 25 U/L (ref 11–51)

## 2024-05-18 MED ORDER — ONDANSETRON 4 MG PO TBDP
4.0000 mg | ORAL_TABLET | Freq: Once | ORAL | Status: AC
  Administered 2024-05-18: 17:00:00 4 mg via ORAL
  Filled 2024-05-18: qty 1

## 2024-05-18 NOTE — Progress Notes (Signed)
 Established Patient Office Visit   Subjective  Patient ID: Courtney Burke, female    DOB: Oct 10, 1955  Age: 68 y.o. MRN: [id number]  Chief Complaint  Patient presents with   Acute Visit    Patient is here because she has been vomiting, refusing to eat, stomach hurts, started Thursday night.  Pt accompanied by her daughter.  Pt is a 68 yo female with pmh sig for h/o CVA wth aphasia, seizure d/o, HLD, GERD, asthma, h/p gallstones, h/o sleeve gastrectomy who is seen for acute concern.  Pt endorses abdominal pain . The pain is sometimes accompanied by nausea and vomiting.  She also reports fatigue and feeling both cold and hot.  [name] states she has been vomiting intermittently since Thursday and not eating since then. Will no longer drink supplemental shakes.  There is concern about intentional weight loss, as pt admits to possibly making herself vomit.   Pt worries about wt at baseline.  Denies constipation, feeling depressed, HA, dizziness, CP.  Recent emotional stressors include a conflict with her brother, which may have contributed to her current emotional state. She feels unloved and has been distancing herself from family members.     Patient Active Problem List   Diagnosis Date Noted   Seizure (HCC) 02/25/2024   Trigger middle finger of left hand 03/28/2022   Aphasia following cerebrovascular accident (CVA) 03/28/2022   Hypokalemia 11/12/2021   Hypomagnesemia 11/12/2021   Normocytic anemia 11/10/2021   Hyponatremia 11/09/2021   Metabolic acidosis 11/09/2021   Intractable nausea and vomiting 11/08/2021   Paroxysmal atrial fibrillation (HCC) 11/08/2021   History of CVA (cerebrovascular accident) 07/24/2021   Syncope 05/16/2021   Breast lump 05/16/2021   Seizure disorder (HCC) 05/16/2021   CVA (cerebral vascular accident) (HCC) 05/11/2021   Acute cerebral infarction (HCC) 05/10/2021   Dyslipidemia 05/09/2021   Type 2 diabetes mellitus with obesity 05/09/2021    Cerebral infarction due to unspecified occlusion or stenosis of left middle cerebral artery (HCC) 06/30/2020   Gallstones 08/18/2019   Esophagitis 07/20/2019   Erosive gastritis 07/20/2019   Nausea vomiting and diarrhea 07/16/2019   AKI (acute kidney injury) 07/15/2019   Chronic bilateral thoracic back pain 06/05/2018   Large breasts 06/05/2018   Seasonal allergic rhinitis due to pollen 05/09/2017   Microcalcifications of the breast 07/02/2016   Mild intermittent asthma without complication 05/08/2016   Severe single current episode of major depressive disorder, without psychotic features (HCC) 05/08/2016   Vitamin D  deficiency 05/08/2016   Encounter for health maintenance examination 05/08/2016   Persistent headaches 02/16/2016   Arthralgia of multiple joints 09/28/2015   Age-related nuclear cataract of both eyes 09/28/2015   Asthma 09/28/2015   Essential hypertension 09/28/2015   Essential thrombocytosis (HCC) 09/28/2015   Gastroesophageal reflux disease without esophagitis 09/28/2015   Keratoconjunctivitis sicca of both eyes not specified as Sjogren's 09/28/2015   Status post gastric bypass for obesity 09/28/2015   Stage 3b chronic kidney disease (HCC) 09/28/2015   Esophageal dysphagia 09/28/2015   Hot flashes 09/28/2015   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   IFG (impaired fasting glucose) 09/28/2015   Menopausal disorder 09/28/2015   Morbid obesity with BMI of 40.0-44.9, adult (HCC) 09/28/2015   Non-smoker 09/28/2015   Pelvic pain in female 09/28/2015   Presbyopia of both eyes 09/28/2015   Snoring 09/28/2015   Tinea corporis 09/28/2015   Osteopenia 07/15/2015   Benign hypertension with CKD (chronic kidney disease) stage III (HCC) 08/11/2014   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  Hyperlipidemia 03/16/2013   Past Medical History:  Diagnosis Date   Acute ischemic left MCA stroke (HCC) 06/30/2020   Acute ischemic stroke (HCC)    Acute stroke due to occlusion of  left middle cerebral artery (HCC) 06/30/2020   Age-related nuclear cataract of both eyes 09/28/2015   Arthralgia of multiple joints 09/28/2015   Asthma    Borderline diabetes    Cerebrovascular accident (CVA) (HCC)    Chronic bilateral thoracic back pain 06/05/2018   Chronic renal insufficiency, stage 1 08/11/2014   Diabetes mellitus type 2 in obese    Dyslipidemia    Dysphagia, post-stroke    Erosive gastritis 07/20/2019   Formatting of this note might be different from the original. On EGD 07/2019   Esophagitis 07/20/2019   Formatting of this note might be different from the original. Added automatically from request for surgery [id number]  Formatting of this note might be different from the original. On EGD 07/2019   Essential hypertension 09/28/2015   Essential thrombocytosis (HCC) 09/28/2015   Gallstones 08/18/2019   Formatting of this note might be different from the original. 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   Morbid obesity (HCC) 05/21/2013   Seizures (HCC)    Stage 3b chronic kidney disease (HCC)    Status post gastric bypass for obesity 09/28/2015   Past Surgical History:  Procedure Laterality Date   ANKLE SURGERY     CARPAL TUNNEL RELEASE     carpel tunnel     ESOPHAGOGASTRODUODENOSCOPY (EGD) WITH PROPOFOL  N/A 11/14/2021   Procedure: ESOPHAGOGASTRODUODENOSCOPY (EGD) WITH PROPOFOL ;  Surgeon: Avram Lupita BRAVO, MD;  Location: [facility] ENDOSCOPY;  Service: Gastroenterology;  Laterality: N/A;   IR ANGIO INTRA EXTRACRAN SEL COM CAROTID INNOMINATE BILAT MOD SED  01/17/2021   IR ANGIO VERTEBRAL SEL SUBCLAVIAN INNOMINATE UNI R MOD SED  01/17/2021   IR CT HEAD LTD  06/30/2020    IR INTRA CRAN STENT  06/30/2020   IR PERCUTANEOUS ART THROMBECTOMY/INFUSION INTRACRANIAL INC DIAG ANGIO  06/30/2020   IR RADIOLOGIST EVAL & MGMT  08/26/2020   IR US  GUIDE VASC ACCESS RIGHT  01/17/2021   RADIOLOGY WITH ANESTHESIA N/A 06/30/2020   Procedure: RADIOLOGY WITH ANESTHESIA;  Surgeon: Radiologist, Medication, MD;  Location: [facility] OR;  Service: Radiology;  Laterality: N/A;   Social History[1] Family History  Problem Relation Age of Onset   Stroke Maternal Grandfather    Allergies[2]  ROS Negative unless stated above    Objective:     Pulse (!) 105   Temp 98.4 F (36.9 C) (Oral)   Ht 5' 2 (1.575 m)   Wt 146 lb 12.8 oz (66.6 kg)   LMP 09/28/2012   SpO2 99%   BMI 26.85 kg/m  BP Readings from Last 3 Encounters:  05/18/24 96/83  03/19/24 (!) 110/98  03/05/24 (!) 84/60   Wt Readings from Last 3 Encounters:  05/18/24 146 lb 12.8 oz (66.6 kg)  03/05/24 164 lb 9.6 oz (74.7 kg)  02/14/24 165 lb 6.4 oz (75 kg)      Physical Exam Constitutional:      General: She is awake.     Appearance: She is ill-appearing.  HENT:     Mouth/Throat:     Mouth: Mucous membranes  are dry.     Comments: Lips pale and dry. Cardiovascular:     Rate and Rhythm: Tachycardia present.     Heart sounds: Murmur heard.     Systolic murmur is present with a grade of 1/6.  Skin:    General: Skin is warm and dry.     Coloration: Skin is ashen and pale.  Neurological:     Mental Status: She is alert.  Psychiatric:        Behavior: Behavior is cooperative.        02/13/2023    4:01 PM 06/29/2022    2:47 PM 03/28/2022   10:17 AM  Depression screen PHQ 2/9  Decreased Interest 0 0 1  Down, Depressed, Hopeless 2 0 1  PHQ - 2 Score 2 0 2  Altered sleeping 2  1  Tired, decreased energy 1  1  Change in appetite 0  0  Feeling bad or failure about yourself  1  0  Trouble concentrating 0  1  Moving slowly or fidgety/restless 1  1  Suicidal thoughts 0  0  PHQ-9 Score 7   6   Difficult doing  work/chores Somewhat difficult  Not difficult at all     Data saved with a previous flowsheet row definition      02/13/2023    4:02 PM  GAD 7 : Generalized Anxiety Score  Nervous, Anxious, on Edge 2  Control/stop worrying 1  Worry too much - different things 0  Trouble relaxing 0  Restless 1  Easily annoyed or irritable 1  Afraid - awful might happen 1  Total GAD 7 Score 6  Anxiety Difficulty Somewhat difficult     Results for orders placed or performed in visit on 05/18/24  POCT Urinalysis Dipstick (Automated)  Result Value Ref Range   Color, UA yellow    Clarity, UA clear    Glucose, UA Negative Negative   Bilirubin, UA neg    Ketones, UA positive (A)    Spec Grav, UA 1.015 1.010 - 1.025   Blood, UA neg    pH, UA 5.5 5.0 - 8.0   Protein, UA Positive (A) Negative   Urobilinogen, UA 0.2 0.2 or 1.0 E.U./dL   Nitrite, UA neg    Leukocytes, UA Trace (A) Negative      Assessment & Plan:   Nausea and vomiting, unspecified vomiting type -     Urine Culture; Future -     POCT Urinalysis Dipstick (Automated)  Abdominal pain, generalized -     Urine Culture; Future -     POCT Urinalysis Dipstick (Automated)  Dehydration  Eating problem  Seizure disorder (HCC)  Pt w/ signs of dehydration including dry mouth on exam. POC with trace leuks, protein, no nitrites.  Send for Cx.  As unable/unwilling to keep food, fluids, or meds down advised to proceed to nearest ED for hydration and further evaluation.  Pt has a fixation with her wt.  Concerned it has acutely worsened due to depression.  Also concerns for seizures starting as has not taken meds.  No follow-ups on file.   Pt to proceed to nearest ED.  Offered to call EMS, declines.  Going by PV.  Clotilda JONELLE Single, MD     [1]  Social History Tobacco Use   Smoking status: Never   Smokeless tobacco: Never  Vaping Use   Vaping status: Never Used  Substance Use Topics   Alcohol use: No   Drug use: No  [  2]   Allergies Allergen Reactions   Homatropine Itching   Iodinated Contrast Media Itching and Other (See Comments)   Doxycycline Nausea And Vomiting and Other (See Comments)   Sulfa Antibiotics Rash and Hives   Codeine Hives and Swelling    Swollen tongue   Hydrocodone Itching   Hydrocodone Bit-Homatrop Mbr Itching   Sulfamethoxazole Hives   Ace Inhibitors Cough, Itching and Rash

## 2024-05-18 NOTE — Patient Instructions (Signed)
 Proceed to nearest ED.

## 2024-05-18 NOTE — ED Provider Triage Note (Signed)
 Emergency Medicine Provider Triage Evaluation Note  Courtney Burke , a 68 y.o. female  was evaluated in triage.  Pt complains of generalized weakness, abdominal pain, NV for 4-5 days. No fever, CP, SOB.   Review of Systems  Positive: CP Negative: Fever  Physical Exam  BP 96/83 (BP Location: Left Arm)   Pulse (!) 109   Temp (!) 97.4 F (36.3 C)   Resp 17   LMP 09/28/2012   SpO2 95%  Gen:   Awake, no distress   Resp:  Normal effort  MSK:   Moves extremities without difficulty  Other:   Medical Decision Making  Medically screening exam initiated at 5:10 PM.  Appropriate orders placed.  Courtney Burke was informed that the remainder of the evaluation will be completed by another provider, this initial triage assessment does not replace that evaluation, and the importance of remaining in the ED until their evaluation is complete.     Courtney Warren SAILOR, PA-C 05/18/24 1710

## 2024-05-18 NOTE — ED Triage Notes (Signed)
 Quick triage note: Pt to ED from PCP  accompanied with daughter c/o emesis , lower abdominal pain X 4 days. Pt mentation at baseline per daughter . Decrease PO intake

## 2024-05-19 ENCOUNTER — Emergency Department (HOSPITAL_COMMUNITY)

## 2024-05-19 LAB — URINE CULTURE
MICRO NUMBER:: 17356176
Result:: NO GROWTH
SPECIMEN QUALITY:: ADEQUATE

## 2024-05-19 MED ORDER — SODIUM CHLORIDE 0.9 % IV BOLUS
1000.0000 mL | Freq: Once | INTRAVENOUS | Status: AC
  Administered 2024-05-19: 05:00:00 1000 mL via INTRAVENOUS

## 2024-05-19 MED ORDER — ONDANSETRON 4 MG PO TBDP: 4.0000 mg | ORAL_TABLET | Freq: Three times a day (TID) | ORAL | 0 refills | Status: DC | PRN

## 2024-05-19 MED ORDER — ONDANSETRON HCL 4 MG/2ML IJ SOLN
4.0000 mg | Freq: Once | INTRAMUSCULAR | Status: AC
  Administered 2024-05-19: 05:00:00 4 mg via INTRAVENOUS
  Filled 2024-05-19: qty 2

## 2024-05-19 NOTE — ED Notes (Signed)
 Pt was successful with PO challenge, no nausea reported.

## 2024-05-19 NOTE — ED Provider Notes (Signed)
 [place] EMERGENCY DEPARTMENT AT [facility] Provider Note   CSN: [id number] Arrival date & time: 05/18/24  1558     Patient presents with: Emesis   Breiana Burke is a 68 y.o. female.   The history is provided by medical records and a relative.  Courtney Burke is a 68 y.o. female who presents to the Emergency Department complaining of vomiting.  She presents to the emergency department for evaluation of vomiting and abdominal pain.  Patient with a history of expressive aphasia secondary to previous stroke, daughter provides all of her history.  Symptoms started on Thursday when [name] started eating and drinking less.  She has been experiencing 3-4 episodes of yellow emesis daily with very mild diarrhea.  She has been complaining of lower abdominal pain intermittently since Thursday where she goes to grab her abdomen and seems uncomfortable.  These episodes occur primarily prior to vomiting.  No definite dysuria.  No fevers.  She has a history of seizure disorder and has been unable to keep her medications down.  She saw her PCP today, who recommended ED evaluation.         Prior to Admission medications  Medication Sig Start Date End Date Taking? Authorizing Provider  ondansetron  (ZOFRAN -ODT) 4 MG disintegrating tablet Take 1 tablet (4 mg total) by mouth every 8 (eight) hours as needed. 05/19/24  Yes Griselda Norris, MD  acetaminophen  (TYLENOL ) 500 MG tablet Take 1,000 mg by mouth every 6 (six) hours as needed for moderate pain or headache.    [provider]  amitriptyline  (ELAVIL ) 50 MG tablet Take 1 tablet (50 mg total) by mouth at bedtime. 02/13/24   Whitfield Raisin, NP  aspirin  EC 81 MG tablet Take 1 tablet (81 mg total) by mouth daily. Swallow whole. 06/29/21   Whitfield Raisin, NP  atorvastatin  (LIPITOR ) 80 MG tablet Take 1 tablet (80 mg total) by mouth daily. 08/14/23   [name], MD  diazePAM , 15 MG Dose, (VALTOCO  15 MG DOSE) 2 x 7.5  MG/0.1ML LQPK Place 15 mg into the nose as needed (seizure rescue). At onset of seizure, may repeat once based on response and tolerability after >=4 hours. Maximum dose: Two doses per episode. Do not use for more than 1 episode every 5 days or more than 5 episodes per month. 02/13/24   Whitfield Raisin, NP  DULoxetine  (CYMBALTA ) 30 MG capsule TAKE 1 CAPSULE BY MOUTH EVERY DAY 10/22/23   Whitfield Raisin, NP  ELIQUIS  5 MG TABS tablet TAKE 1 TABLET BY MOUTH TWICE A DAY 12/23/23   Camnitz, Soyla Lunger, MD  levETIRAcetam  (KEPPRA ) 1000 MG tablet Take 1 tablet (1,000 mg total) by mouth 2 (two) times daily. 02/29/24   Drusilla Sabas RAMAN, MD  NURTEC 75 MG TBDP TAKE 1 TABLET (75 MG TOTAL) BY MOUTH AS NEEDED. 09/16/23   Whitfield Raisin, NP  topiramate  (TOPAMAX ) 100 MG tablet Take 1 tablet (100 mg total) by mouth 2 (two) times daily. Take 1 tablet twice daily 02/13/24   Whitfield Raisin, NP  ursodiol  (ACTIGALL ) 250 MG tablet Take 250 mg by mouth 2 (two) times daily. 07/28/20   [provider]    Allergies: Homatropine, Iodinated contrast media, Doxycycline, Sulfa antibiotics, Codeine, Hydrocodone, Hydrocodone bit-homatrop mbr, Sulfamethoxazole, and Ace inhibitors    Review of Systems  All other systems reviewed and are negative.   Updated Vital Signs BP 117/72 (BP Location: Right Arm)   Pulse 97   Temp 98.7 F (37.1 C) (Oral)  Resp 17   LMP 09/28/2012   SpO2 99%   Physical Exam Vitals and nursing note reviewed.  Constitutional:      Appearance: She is well-developed.  HENT:     Head: Normocephalic and atraumatic.  Cardiovascular:     Rate and Rhythm: Normal rate and regular rhythm.     Heart sounds: No murmur heard. Pulmonary:     Effort: Pulmonary effort is normal. No respiratory distress.     Breath sounds: Normal breath sounds.  Abdominal:     Palpations: Abdomen is soft.     Tenderness: There is no abdominal tenderness. There is no guarding or rebound.  Musculoskeletal:        General: No  tenderness.  Skin:    General: Skin is warm and dry.  Neurological:     Mental Status: She is alert.  Psychiatric:        Behavior: Behavior normal.     (all labs ordered are listed, but only abnormal results are displayed) Labs Reviewed  CBC WITH DIFFERENTIAL/PLATELET - Abnormal; Notable for the following components:      Result Value   Platelets 748 (*)    Abs Immature Granulocytes 0.09 (*)    All other components within normal limits  COMPREHENSIVE METABOLIC PANEL WITH GFR - Abnormal; Notable for the following components:   Sodium 134 (*)    CO2 18 (*)    Glucose, Bld 168 (*)    BUN 24 (*)    Creatinine, Ser 2.09 (*)    Alkaline Phosphatase 180 (*)    GFR, Estimated 25 (*)    All other components within normal limits  URINALYSIS, ROUTINE W REFLEX MICROSCOPIC - Abnormal; Notable for the following components:   APPearance HAZY (*)    Ketones, ur 5 (*)    Protein, ur 30 (*)    Leukocytes,Ua LARGE (*)    All other components within normal limits  URINE CULTURE  LIPASE, BLOOD    EKG: None  Radiology: CT ABDOMEN PELVIS WO CONTRAST Result Date: 05/19/2024 EXAM: CT ABDOMEN AND PELVIS WITHOUT CONTRAST 05/19/2024 04:31:06 AM TECHNIQUE: CT of the abdomen and pelvis was performed without the administration of intravenous contrast. Multiplanar reformatted images are provided for review. Automated exposure control, iterative reconstruction, and/or weight-based adjustment of the mA/kV was utilized to reduce the radiation dose to as low as reasonably achievable. COMPARISON: CT of the abdomen and pelvis dated 11/08/2021. CLINICAL HISTORY: Abdominal pain, acute, nonlocalized; Bowel obstruction suspected. FINDINGS: LOWER CHEST: No acute abnormality. LIVER: The liver is unremarkable. GALLBLADDER AND BILE DUCTS: Status post cholecystectomy. No biliary ductal dilatation. SPLEEN: No acute abnormality. PANCREAS: No acute abnormality. ADRENAL GLANDS: No acute abnormality. KIDNEYS, URETERS AND  BLADDER: No stones in the kidneys or ureters. No hydronephrosis. No perinephric or periureteral stranding. Urinary bladder is unremarkable. GI AND BOWEL: Status post gastric bypass surgery. There are a few colonic diverticuli, but no evidence of diverticulitis. The small bowel is unremarkable. The appendix is not clearly identified. There is no bowel obstruction. PERITONEUM AND RETROPERITONEUM: No ascites. No free air. VASCULATURE: Aorta is normal in caliber. LYMPH NODES: No lymphadenopathy. REPRODUCTIVE ORGANS: No acute abnormality. BONES AND SOFT TISSUES: No acute osseous abnormality. No focal soft tissue abnormality. IMPRESSION: 1. No acute findings in the abdomen or pelvis. 2. Status post gastric bypass surgery and cholecystectomy. 3. A few colonic diverticuli without evidence of diverticulitis. Electronically signed by: Evalene Coho MD 05/19/2024 04:49 AM EST RP Workstation: [id number]     Procedures  Medications Ordered in the ED  ondansetron  (ZOFRAN -ODT) disintegrating tablet 4 mg (4 mg Oral Given 05/18/24 1715)  sodium chloride  0.9 % bolus 1,000 mL (0 mLs Intravenous Stopped 05/19/24 0601)  ondansetron  (ZOFRAN ) injection 4 mg (4 mg Intravenous Given 05/19/24 0501)                                    Medical Decision Making Amount and/or Complexity of Data Reviewed Labs: ordered. Radiology: ordered.  Risk Prescription drug management.   Pt with hx/o prior CVA with expressive aphasia, ckd, sz do here for evaluation of vomiting and intermittent abdominal discomfort for several days.  On exam she is at her baseline, nontoxic appearing with no focal abdominal tenderness.  Daughter is at bedside to assist with hx.  Labs with renal function similar when compared to priors.  UA not c/w UTI but will send culture given her lower abdominal pain and aphasia limiting ability to ascertain dysuria/change in urine. CBC with elevated platelets, otherwise unremarkable.  CT scan without evidence of  obstruction or additional acute process.  She was treated with IVF, antiemetic in the ED and felt improved on recheck.  She was able to tolerate po.  Feels she is stable for discharge with antiemetic at home with close pcp follow up and return precautions.     Final diagnoses:  Dehydration  Nausea and vomiting, unspecified vomiting type    ED Discharge Orders          Ordered    ondansetron  (ZOFRAN -ODT) 4 MG disintegrating tablet  Every 8 hours PRN        05/19/24 0536               Griselda Norris, MD 05/19/24 [phone]

## 2024-05-20 ENCOUNTER — Ambulatory Visit: Payer: Self-pay | Admitting: Family Medicine

## 2024-05-21 LAB — URINE CULTURE: Culture: <10000 — AB

## 2024-05-26 ENCOUNTER — Other Ambulatory Visit (HOSPITAL_COMMUNITY): Payer: Self-pay

## 2024-05-26 NOTE — Telephone Encounter (Signed)
 Pharmacy Patient Advocate Encounter  Received notification from [employer] that Prior Authorization for Valtoco  has been APPROVED from 05/26/2024 to 06/03/2025. Ran test claim, Copay is $0. This test claim was processed through [facility]- copay amounts may vary at other pharmacies due to pharmacy/plan contracts, or as the patient moves through the different stages of their insurance plan.   PA #/Case ID/Reference #: [id number]

## 2024-05-26 NOTE — Telephone Encounter (Signed)
 Pharmacy Patient Advocate Encounter   Received notification from Cover[employer]eds that prior authorization for Valtoco  is required/requested.   Insurance verification completed.   The patient is insured through [facility].   Per test claim: PA required; PA submitted to above mentioned insurance via Latent Key/confirmation #/EOC [facility] Status is pending

## 2024-06-02 ENCOUNTER — Encounter

## 2024-06-15 ENCOUNTER — Ambulatory Visit: Admitting: Family Medicine

## 2024-06-15 ENCOUNTER — Inpatient Hospital Stay (HOSPITAL_COMMUNITY)
Admission: EM | Admit: 2024-06-15 | Discharge: 2024-06-18 | DRG: 378 | Disposition: A | Discharge status: Home / Self Care | Attending: Internal Medicine | Admitting: Internal Medicine

## 2024-06-15 ENCOUNTER — Emergency Department (HOSPITAL_COMMUNITY)

## 2024-06-15 DIAGNOSIS — I69320 Aphasia following cerebral infarction: Secondary | ICD-10-CM | POA: Diagnosis not present

## 2024-06-15 DIAGNOSIS — D509 Iron deficiency anemia, unspecified: Secondary | ICD-10-CM | POA: Diagnosis present

## 2024-06-15 DIAGNOSIS — H2513 Age-related nuclear cataract, bilateral: Secondary | ICD-10-CM | POA: Diagnosis present

## 2024-06-15 DIAGNOSIS — E1165 Type 2 diabetes mellitus with hyperglycemia: Secondary | ICD-10-CM | POA: Diagnosis present

## 2024-06-15 DIAGNOSIS — E1122 Type 2 diabetes mellitus with diabetic chronic kidney disease: Secondary | ICD-10-CM | POA: Diagnosis present

## 2024-06-15 DIAGNOSIS — R569 Unspecified convulsions: Secondary | ICD-10-CM | POA: Diagnosis not present

## 2024-06-15 DIAGNOSIS — Z888 Allergy status to other drugs, medicaments and biological substances status: Secondary | ICD-10-CM

## 2024-06-15 DIAGNOSIS — G40909 Epilepsy, unspecified, not intractable, without status epilepticus: Secondary | ICD-10-CM | POA: Diagnosis present

## 2024-06-15 DIAGNOSIS — Z79899 Other long term (current) drug therapy: Secondary | ICD-10-CM

## 2024-06-15 DIAGNOSIS — Z885 Allergy status to narcotic agent status: Secondary | ICD-10-CM

## 2024-06-15 DIAGNOSIS — D62 Acute posthemorrhagic anemia: Secondary | ICD-10-CM | POA: Diagnosis present

## 2024-06-15 DIAGNOSIS — Z881 Allergy status to other antibiotic agents status: Secondary | ICD-10-CM

## 2024-06-15 DIAGNOSIS — I129 Hypertensive chronic kidney disease with stage 1 through stage 4 chronic kidney disease, or unspecified chronic kidney disease: Secondary | ICD-10-CM | POA: Diagnosis present

## 2024-06-15 DIAGNOSIS — D6832 Hemorrhagic disorder due to extrinsic circulating anticoagulants: Secondary | ICD-10-CM | POA: Diagnosis present

## 2024-06-15 DIAGNOSIS — I480 Paroxysmal atrial fibrillation: Secondary | ICD-10-CM | POA: Diagnosis present

## 2024-06-15 DIAGNOSIS — I951 Orthostatic hypotension: Secondary | ICD-10-CM | POA: Diagnosis present

## 2024-06-15 DIAGNOSIS — Z8673 Personal history of transient ischemic attack (TIA), and cerebral infarction without residual deficits: Secondary | ICD-10-CM

## 2024-06-15 DIAGNOSIS — I959 Hypotension, unspecified: Secondary | ICD-10-CM | POA: Diagnosis not present

## 2024-06-15 DIAGNOSIS — I69398 Other sequelae of cerebral infarction: Secondary | ICD-10-CM

## 2024-06-15 DIAGNOSIS — F32A Depression, unspecified: Secondary | ICD-10-CM | POA: Diagnosis present

## 2024-06-15 DIAGNOSIS — R55 Syncope and collapse: Principal | ICD-10-CM | POA: Diagnosis present

## 2024-06-15 DIAGNOSIS — Z9884 Bariatric surgery status: Secondary | ICD-10-CM

## 2024-06-15 DIAGNOSIS — N1832 Chronic kidney disease, stage 3b: Secondary | ICD-10-CM | POA: Diagnosis present

## 2024-06-15 DIAGNOSIS — Z7982 Long term (current) use of aspirin: Secondary | ICD-10-CM | POA: Diagnosis not present

## 2024-06-15 DIAGNOSIS — K284 Chronic or unspecified gastrojejunal ulcer with hemorrhage: Secondary | ICD-10-CM | POA: Diagnosis present

## 2024-06-15 DIAGNOSIS — Z91041 Radiographic dye allergy status: Secondary | ICD-10-CM

## 2024-06-15 DIAGNOSIS — R195 Other fecal abnormalities: Secondary | ICD-10-CM | POA: Diagnosis present

## 2024-06-15 DIAGNOSIS — R339 Retention of urine, unspecified: Secondary | ICD-10-CM | POA: Diagnosis not present

## 2024-06-15 DIAGNOSIS — Z980 Intestinal bypass and anastomosis status: Secondary | ICD-10-CM

## 2024-06-15 DIAGNOSIS — Z7901 Long term (current) use of anticoagulants: Secondary | ICD-10-CM | POA: Diagnosis not present

## 2024-06-15 DIAGNOSIS — G43909 Migraine, unspecified, not intractable, without status migrainosus: Secondary | ICD-10-CM

## 2024-06-15 DIAGNOSIS — G40919 Epilepsy, unspecified, intractable, without status epilepticus: Secondary | ICD-10-CM

## 2024-06-15 DIAGNOSIS — E876 Hypokalemia: Secondary | ICD-10-CM | POA: Diagnosis not present

## 2024-06-15 DIAGNOSIS — E785 Hyperlipidemia, unspecified: Secondary | ICD-10-CM | POA: Diagnosis present

## 2024-06-15 DIAGNOSIS — K289 Gastrojejunal ulcer, unspecified as acute or chronic, without hemorrhage or perforation: Secondary | ICD-10-CM | POA: Diagnosis not present

## 2024-06-15 DIAGNOSIS — K922 Gastrointestinal hemorrhage, unspecified: Secondary | ICD-10-CM | POA: Insufficient documentation

## 2024-06-15 DIAGNOSIS — K2960 Other gastritis without bleeding: Secondary | ICD-10-CM | POA: Diagnosis not present

## 2024-06-15 DIAGNOSIS — K219 Gastro-esophageal reflux disease without esophagitis: Secondary | ICD-10-CM | POA: Diagnosis present

## 2024-06-15 DIAGNOSIS — Z59868 Other specified financial insecurity: Secondary | ICD-10-CM

## 2024-06-15 DIAGNOSIS — Z882 Allergy status to sulfonamides status: Secondary | ICD-10-CM

## 2024-06-15 LAB — URINALYSIS, W/ REFLEX TO CULTURE (INFECTION SUSPECTED)
Bacteria, UA: NONE SEEN
Bilirubin Urine: NEGATIVE
Glucose, UA: NEGATIVE mg/dL
Hgb urine dipstick: NEGATIVE
Ketones, ur: NEGATIVE mg/dL
Leukocytes,Ua: NEGATIVE
Nitrite: NEGATIVE
Protein, ur: 30 mg/dL — AB
Specific Gravity, Urine: 1.011 (ref 1.005–1.030)
pH: 6 (ref 5.0–8.0)

## 2024-06-15 LAB — CBC WITH DIFFERENTIAL/PLATELET
Abs Immature Granulocytes: 0.14 K/uL — ABNORMAL HIGH (ref 0.00–0.07)
Basophils Absolute: 0 K/uL (ref 0.0–0.1)
Basophils Relative: 0 %
Eosinophils Absolute: 0.1 K/uL (ref 0.0–0.5)
Eosinophils Relative: 1 %
HCT: 29.2 % — ABNORMAL LOW (ref 36.0–46.0)
Hemoglobin: 9.5 g/dL — ABNORMAL LOW (ref 12.0–15.0)
Immature Granulocytes: 1 %
Lymphocytes Relative: 12 %
Lymphs Abs: 1.2 K/uL (ref 0.7–4.0)
MCH: 27.5 pg (ref 26.0–34.0)
MCHC: 32.5 g/dL (ref 30.0–36.0)
MCV: 84.6 fL (ref 80.0–100.0)
Monocytes Absolute: 0.4 K/uL (ref 0.1–1.0)
Monocytes Relative: 4 %
Neutro Abs: 8.1 K/uL — ABNORMAL HIGH (ref 1.7–7.7)
Neutrophils Relative %: 82 %
Platelets: 488 K/uL — ABNORMAL HIGH (ref 150–400)
RBC: 3.45 MIL/uL — ABNORMAL LOW (ref 3.87–5.11)
RDW: 14.4 % (ref 11.5–15.5)
WBC: 10.1 K/uL (ref 4.0–10.5)
nRBC: 0 % (ref 0.0–0.2)

## 2024-06-15 LAB — COMPREHENSIVE METABOLIC PANEL WITH GFR
ALT: 9 U/L (ref 0–44)
AST: 31 U/L (ref 15–41)
Albumin: 2.6 g/dL — ABNORMAL LOW (ref 3.5–5.0)
Alkaline Phosphatase: 100 U/L (ref 38–126)
Anion gap: 16 — ABNORMAL HIGH (ref 5–15)
BUN: 18 mg/dL (ref 8–23)
CO2: 16 mmol/L — ABNORMAL LOW (ref 22–32)
Calcium: 7.6 mg/dL — ABNORMAL LOW (ref 8.9–10.3)
Chloride: 103 mmol/L (ref 98–111)
Creatinine, Ser: 1.75 mg/dL — ABNORMAL HIGH (ref 0.44–1.00)
GFR, Estimated: 31 mL/min — ABNORMAL LOW
Glucose, Bld: 214 mg/dL — ABNORMAL HIGH (ref 70–99)
Potassium: 2.6 mmol/L — CL (ref 3.5–5.1)
Sodium: 135 mmol/L (ref 135–145)
Total Bilirubin: 0.5 mg/dL (ref 0.0–1.2)
Total Protein: 5.9 g/dL — ABNORMAL LOW (ref 6.5–8.1)

## 2024-06-15 LAB — PHOSPHORUS: Phosphorus: 2.3 mg/dL — ABNORMAL LOW (ref 2.5–4.6)

## 2024-06-15 LAB — URINE DRUG SCREEN
Amphetamines: NEGATIVE
Barbiturates: NEGATIVE
Benzodiazepines: NEGATIVE
Cocaine: NEGATIVE
Fentanyl: NEGATIVE
Methadone Scn, Ur: NEGATIVE
Opiates: NEGATIVE
Tetrahydrocannabinol: NEGATIVE

## 2024-06-15 LAB — TROPONIN T, HIGH SENSITIVITY
Troponin T High Sensitivity: 41 ng/L — ABNORMAL HIGH (ref 0–19)
Troponin T High Sensitivity: 41 ng/L — ABNORMAL HIGH (ref 0–19)

## 2024-06-15 LAB — I-STAT CG4 LACTIC ACID, ED
Lactic Acid, Venous: 3.2 mmol/L (ref 0.5–1.9)
Lactic Acid, Venous: 1.8 mmol/L (ref 0.5–1.9)

## 2024-06-15 LAB — POC OCCULT BLOOD, ED: Fecal Occult Bld: POSITIVE — AB

## 2024-06-15 LAB — I-STAT CHEM 8, ED
BUN: 17 mg/dL (ref 8–23)
Calcium, Ion: 0.99 mmol/L — ABNORMAL LOW (ref 1.15–1.40)
Chloride: 104 mmol/L (ref 98–111)
Creatinine, Ser: 1.7 mg/dL — ABNORMAL HIGH (ref 0.44–1.00)
Glucose, Bld: 207 mg/dL — ABNORMAL HIGH (ref 70–99)
HCT: 28 % — ABNORMAL LOW (ref 36.0–46.0)
Hemoglobin: 9.5 g/dL — ABNORMAL LOW (ref 12.0–15.0)
Potassium: 2.5 mmol/L — CL (ref 3.5–5.1)
Sodium: 138 mmol/L (ref 135–145)
TCO2: 16 mmol/L — ABNORMAL LOW (ref 22–32)

## 2024-06-15 LAB — ETHANOL: Alcohol, Ethyl (B): <15 mg/dL

## 2024-06-15 LAB — CBG MONITORING, ED: Glucose-Capillary: 180 mg/dL — ABNORMAL HIGH (ref 70–99)

## 2024-06-15 LAB — MAGNESIUM: Magnesium: 1.7 mg/dL (ref 1.7–2.4)

## 2024-06-15 LAB — ABO/RH: ABO/RH(D): O POS

## 2024-06-15 MED ORDER — POTASSIUM CHLORIDE CRYS ER 20 MEQ PO TBCR
40.0000 meq | EXTENDED_RELEASE_TABLET | ORAL | Status: AC
  Administered 2024-06-15 – 2024-06-16 (×2): 40 meq via ORAL
  Filled 2024-06-15 (×2): qty 2

## 2024-06-15 MED ORDER — POTASSIUM PHOSPHATES 15 MMOLE/5ML IV SOLN
15.0000 mmol | Freq: Once | INTRAVENOUS | Status: AC
  Administered 2024-06-15: 15 mmol via INTRAVENOUS
  Filled 2024-06-15: qty 5

## 2024-06-15 MED ORDER — ACETAMINOPHEN 325 MG PO TABS
650.0000 mg | ORAL_TABLET | Freq: Four times a day (QID) | ORAL | Status: DC | PRN
  Administered 2024-06-17: 650 mg via ORAL
  Filled 2024-06-15: qty 2

## 2024-06-15 MED ORDER — ACETAMINOPHEN 650 MG RE SUPP: 650.0000 mg | Freq: Four times a day (QID) | RECTAL | Status: DC | PRN

## 2024-06-15 MED ORDER — ONDANSETRON HCL 4 MG PO TABS: 4.0000 mg | ORAL_TABLET | Freq: Four times a day (QID) | ORAL | Status: DC | PRN

## 2024-06-15 MED ORDER — LEVETIRACETAM (KEPPRA) 500 MG/5 ML ADULT IV PUSH
1000.0000 mg | Freq: Once | INTRAVENOUS | Status: AC
  Administered 2024-06-15: 1000 mg via INTRAVENOUS

## 2024-06-15 MED ORDER — PANTOPRAZOLE SODIUM 40 MG IV SOLR
80.0000 mg | Freq: Once | INTRAVENOUS | Status: AC
  Administered 2024-06-15: 80 mg via INTRAVENOUS
  Filled 2024-06-15: qty 20

## 2024-06-15 MED ORDER — POTASSIUM CHLORIDE 10 MEQ/100ML IV SOLN
10.0000 meq | Freq: Once | INTRAVENOUS | Status: AC
  Administered 2024-06-15: 10 meq via INTRAVENOUS
  Filled 2024-06-15: qty 100

## 2024-06-15 MED ORDER — SENNOSIDES-DOCUSATE SODIUM 8.6-50 MG PO TABS: 1.0000 | ORAL_TABLET | Freq: Every evening | ORAL | Status: DC | PRN

## 2024-06-15 MED ORDER — LEVETIRACETAM 500 MG PO TABS
1000.0000 mg | ORAL_TABLET | Freq: Two times a day (BID) | ORAL | Status: DC
  Administered 2024-06-16 – 2024-06-18 (×5): 1000 mg via ORAL
  Filled 2024-06-15: qty 4
  Filled 2024-06-15 (×4): qty 2

## 2024-06-15 MED ORDER — AMITRIPTYLINE HCL 50 MG PO TABS
50.0000 mg | ORAL_TABLET | Freq: Every day | ORAL | Status: DC
  Administered 2024-06-16 – 2024-06-17 (×3): 50 mg via ORAL
  Filled 2024-06-15 (×3): qty 1
  Filled 2024-06-15: qty 2

## 2024-06-15 MED ORDER — ATORVASTATIN CALCIUM 80 MG PO TABS
80.0000 mg | ORAL_TABLET | Freq: Every day | ORAL | Status: DC
  Administered 2024-06-16 – 2024-06-18 (×3): 80 mg via ORAL
  Filled 2024-06-15 (×2): qty 1
  Filled 2024-06-15: qty 2

## 2024-06-15 MED ORDER — TOPIRAMATE 25 MG PO TABS
100.0000 mg | ORAL_TABLET | Freq: Two times a day (BID) | ORAL | Status: DC
  Administered 2024-06-16 – 2024-06-18 (×6): 100 mg via ORAL
  Filled 2024-06-15 (×6): qty 4

## 2024-06-15 MED ORDER — MAGNESIUM SULFATE 2 GM/50ML IV SOLN
2.0000 g | Freq: Once | INTRAVENOUS | Status: AC
  Administered 2024-06-15: 2 g via INTRAVENOUS
  Filled 2024-06-15: qty 50

## 2024-06-15 MED ORDER — DULOXETINE HCL 30 MG PO CPEP
30.0000 mg | ORAL_CAPSULE | Freq: Every day | ORAL | Status: DC
  Administered 2024-06-16 – 2024-06-18 (×3): 30 mg via ORAL
  Filled 2024-06-15 (×3): qty 1

## 2024-06-15 MED ORDER — URSODIOL 300 MG PO CAPS
300.0000 mg | ORAL_CAPSULE | Freq: Two times a day (BID) | ORAL | Status: DC
  Administered 2024-06-16 – 2024-06-18 (×6): 300 mg via ORAL
  Filled 2024-06-15 (×8): qty 1

## 2024-06-15 MED ORDER — ASPIRIN 81 MG PO TBEC: 81.0000 mg | DELAYED_RELEASE_TABLET | Freq: Every day | ORAL | Status: DC

## 2024-06-15 MED ORDER — SODIUM CHLORIDE 0.9 % IV BOLUS
1000.0000 mL | Freq: Once | INTRAVENOUS | Status: AC
  Administered 2024-06-15: 1000 mL via INTRAVENOUS

## 2024-06-15 MED ORDER — ONDANSETRON HCL 4 MG/2ML IJ SOLN: 4.0000 mg | Freq: Four times a day (QID) | INTRAMUSCULAR | Status: DC | PRN

## 2024-06-15 MED ORDER — BISACODYL 5 MG PO TBEC: 5.0000 mg | DELAYED_RELEASE_TABLET | Freq: Every day | ORAL | Status: DC | PRN

## 2024-06-15 NOTE — ED Notes (Addendum)
 Pt started complaining of right arm hurting. IV infiltrated. NS and potassium running. Medication and fluid stopped. Pharmacist called, right arm elevated with towel. Pt complains with touch, site left alone, IV still in place at this time. Right arm is swollen, no change in color at this time.

## 2024-06-15 NOTE — H&P (Incomplete)
 " History and Physical  Courtney Burke FMW:[id number] DOB: Jun 09, 1955 DOA: 06/15/2024  PCP: [name], MD   Chief Complaint: Syncope, seizure  HPI: Courtney Burke is a 69 y.o. 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 Per daughter, patient has been weak/fatigued over the last few days. Today, as she was getting up for doctor's appointment, she started banging her head against the wall and collapsed on the floor. She did not lose consciousness however when she sat patient up, patient's body became stiff with mild shaking of the upper extremities and her eyes rolled back. Patient had a bowel and urinary incontinence during the episode so they called EMS for evaluation.  Reports that patient has recently dark watery stools over the last 2 days and a mild cough but no fevers, chills, nausea, vomiting or abdominal pain.  Reports patient's last seizure was a 1 month ago.  Patient has been compliant with her Keppra  and Eliquis .  ED Course: Initial vitals show afebrile, RR 18-23, HR 80s, SBP 90-1 tens. Initial labs significant for KCl 2.6, glucose 214, creatinine 1.75, calcium  7.6, bicarb 16, AG 16, WBC 12.1, Hgb 9.5, platelet 488, troponin 41-41, lactic acid 1.8, phosphorus 2.3, magnesium  1.7, UA with no signs of infection, UDS negative. Trauma scan negative. Pt received IV Keppra  1 g, IV Protonix  80 mg x 1 and IV KCl 10 mEq x 1. [facility] was consulted for evaluation. [facility] was consulted for admission.   Review of Systems: Please see HPI for pertinent positives and negatives. A complete 10 system review of systems are otherwise negative.  Past Medical History:  Diagnosis Date   Acute ischemic left MCA stroke (HCC) 06/30/2020   Acute ischemic  stroke (HCC)    Acute stroke due to occlusion of left middle cerebral artery (HCC) 06/30/2020   Age-related nuclear cataract of both eyes 09/28/2015   Arthralgia of multiple joints 09/28/2015   Asthma    Borderline diabetes    Cerebrovascular accident (CVA) (HCC)    Chronic bilateral thoracic back pain 06/05/2018   Chronic renal insufficiency, stage 1 08/11/2014   Diabetes mellitus type 2 in obese    Dyslipidemia    Dysphagia, post-stroke    Erosive gastritis 07/20/2019   Formatting of this note might be different from the original. On EGD 07/2019   Esophagitis 07/20/2019   Formatting of this note might be different from the original. Added automatically from request for surgery [id number]  Formatting of this note might be different from the original. On EGD 07/2019   Essential hypertension 09/28/2015   Essential thrombocytosis (HCC) 09/28/2015   Gallstones 08/18/2019   Formatting of this note might be different from the original. 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   Morbid obesity (HCC) 05/21/2013   Seizures (HCC)    Stage 3b chronic kidney disease (HCC)    Status post gastric bypass for obesity 09/28/2015   Past Surgical History:  Procedure Laterality Date   ANKLE SURGERY     CARPAL TUNNEL RELEASE  carpel tunnel     ESOPHAGOGASTRODUODENOSCOPY (EGD) WITH PROPOFOL  N/A 11/14/2021   Procedure: ESOPHAGOGASTRODUODENOSCOPY (EGD) WITH PROPOFOL ;  Surgeon: Avram Lupita BRAVO, MD;  Location: [facility] ENDOSCOPY;  Service: Gastroenterology;  Laterality: N/A;   IR ANGIO INTRA EXTRACRAN SEL COM CAROTID INNOMINATE BILAT MOD SED  01/17/2021   IR ANGIO VERTEBRAL SEL SUBCLAVIAN INNOMINATE UNI R  MOD SED  01/17/2021   IR CT HEAD LTD  06/30/2020   IR INTRA CRAN STENT  06/30/2020   IR PERCUTANEOUS ART THROMBECTOMY/INFUSION INTRACRANIAL INC DIAG ANGIO  06/30/2020   IR RADIOLOGIST EVAL & MGMT  08/26/2020   IR US  GUIDE VASC ACCESS RIGHT  01/17/2021   RADIOLOGY WITH ANESTHESIA N/A 06/30/2020   Procedure: RADIOLOGY WITH ANESTHESIA;  Surgeon: Radiologist, Medication, MD;  Location: [facility] OR;  Service: Radiology;  Laterality: N/A;   Social History:  reports that she has never smoked. She has never used smokeless tobacco. She reports that she does not drink alcohol and does not use drugs.  Allergies[1]  Family History  Problem Relation Age of Onset   Stroke Maternal Grandfather      Prior to Admission medications  Medication Sig Start Date End Date Taking? Authorizing Provider  acetaminophen  (TYLENOL ) 500 MG tablet Take 1,000 mg by mouth every 6 (six) hours as needed for moderate pain or headache.   Yes [provider]  amitriptyline  (ELAVIL ) 50 MG tablet Take 1 tablet (50 mg total) by mouth at bedtime. 02/13/24  Yes Whitfield Raisin, NP  aspirin  EC 81 MG tablet Take 1 tablet (81 mg total) by mouth daily. Swallow whole. 06/29/21  Yes McCue, Raisin, NP  atorvastatin  (LIPITOR ) 80 MG tablet Take 1 tablet (80 mg total) by mouth daily. 08/14/23  Yes [name], MD  diazePAM , 15 MG Dose, (VALTOCO  15 MG DOSE) 2 x 7.5 MG/0.1ML LQPK Place 15 mg into the nose as needed (seizure rescue). At onset of seizure, may repeat once based on response and tolerability after >=4 hours. Maximum dose: Two doses per episode. Do not use for more than 1 episode every 5 days or more than 5 episodes per month. 02/13/24  Yes McCue, Raisin, NP  DULoxetine  (CYMBALTA ) 30 MG capsule TAKE 1 CAPSULE BY MOUTH EVERY DAY 10/22/23  Yes McCue, Raisin, NP  ELIQUIS  5 MG TABS tablet TAKE 1 TABLET BY MOUTH TWICE A DAY 12/23/23  Yes Camnitz, Soyla Lunger, MD  levETIRAcetam  (KEPPRA ) 1000 MG tablet Take 1 tablet (1,000 mg total) by mouth 2  (two) times daily. 02/29/24  Yes Lama, Sabas RAMAN, MD  NURTEC 75 MG TBDP TAKE 1 TABLET (75 MG TOTAL) BY MOUTH AS NEEDED. 09/16/23  Yes McCue, Raisin, NP  omeprazole (PRILOSEC) 40 MG capsule Take 40 mg by mouth daily. 02/29/24  Yes [provider]  topiramate  (TOPAMAX ) 100 MG tablet Take 1 tablet (100 mg total) by mouth 2 (two) times daily. Take 1 tablet twice daily 02/13/24  Yes McCue, Raisin, NP  ursodiol  (ACTIGALL ) 250 MG tablet Take 250 mg by mouth 2 (two) times daily. 07/28/20  Yes [provider]    Physical Exam: BP 119/69 (BP Location: Left Arm)   Pulse 81   Temp 97.9 F (36.6 C) (Oral)   Resp 20   LMP 09/28/2012   SpO2 100%  General: Pleasant, weak appearing elderly woman laying in bed. No acute distress. HEENT: [place]/AT. Anicteric sclera. Pale conjunctiva. CV: RRR. No murmurs, rubs, or gallops. No LE edema Pulmonary: Lungs CTAB. Normal effort. No wheezing or rales. Abdominal: Soft, nontender,  nondistended. Normal bowel sounds. Extremities: Palpable radial and DP pulses. Normal ROM. Skin: Warm and dry. No obvious rash or lesions. Neuro: A&Ox3. Expressive aphasia. Able to say name and a few words. Follows commands. Moves all extremities. Normal sensation to light touch.  Psych: Normal mood and affect          Labs on Admission:  Basic Metabolic Panel: Recent Labs  Lab 06/15/24 1555 06/15/24 1603 06/15/24 1955  NA 138 135  --   K 2.5* 2.6*  --   CL 104 103  --   CO2  --  16*  --   GLUCOSE 207* 214*  --   BUN 17 18  --   CREATININE 1.70* 1.75*  --   CALCIUM   --  7.6*  --   MG  --   --  1.7  PHOS  --   --  2.3*   Liver Function Tests: Recent Labs  Lab 06/15/24 1603  AST 31  ALT 9  ALKPHOS 100  BILITOT 0.5  PROT 5.9*  ALBUMIN 2.6*   No results for input(s): LIPASE, AMYLASE in the last 168 hours. No results for input(s): AMMONIA in the last 168 hours. CBC: Recent Labs  Lab 06/15/24 1555 06/15/24 1606  WBC  --  10.1  NEUTROABS  --  8.1*   HGB 9.5* 9.5*  HCT 28.0* 29.2*  MCV  --  84.6  PLT  --  488*   Cardiac Enzymes: No results for input(s): CKTOTAL, CKMB, CKMBINDEX, TROPONINI in the last 168 hours. BNP (last 3 results) No results for input(s): BNP in the last 8760 hours.  ProBNP (last 3 results) No results for input(s): PROBNP in the last 8760 hours.  CBG: Recent Labs  Lab 06/15/24 1547  GLUCAP 180*    Radiological Exams on Admission: DG Pelvis Portable Result Date: 06/15/2024 CLINICAL DATA:  Clemens, seizures EXAM: PORTABLE PELVIS 1-2 VIEWS COMPARISON:  07/13/2020 FINDINGS: Supine frontal view of the pelvis includes both hips. No acute displaced fracture. Alignment is anatomic. Mild symmetrical bilateral hip osteoarthritis. Sacroiliac joints are unremarkable. IMPRESSION: 1. No acute displaced fracture. 2. Mild symmetrical bilateral hip osteoarthritis. Electronically Signed   By: Ozell Daring M.D.   On: 06/15/2024 17:07   CT Cervical Spine Wo Contrast Result Date: 06/15/2024 CLINICAL DATA:  Clemens, hit head EXAM: CT CERVICAL SPINE WITHOUT CONTRAST TECHNIQUE: Multidetector CT imaging of the cervical spine was performed without intravenous contrast. Multiplanar CT image reconstructions were also generated. RADIATION DOSE REDUCTION: This exam was performed according to the departmental dose-optimization program which includes automated exposure control, adjustment of the mA and/or kV according to patient size and/or use of iterative reconstruction technique. COMPARISON:  None Available. FINDINGS: Alignment: Alignment is anatomic. Skull base and vertebrae: No acute fracture. Benign cyst or hemangioma within the C5 vertebral body. No destructive bony abnormalities. Soft tissues and spinal canal: No prevertebral fluid or swelling. No visible canal hematoma. Prominent calcified atheromatous plaque at the carotid bifurcations. Disc levels: Hypertrophic changes at the C1-C2 interface. Disc spaces are relatively well  preserved. Upper chest: Airway is patent.  Lung apices are clear. Other: Reconstructed images demonstrate no additional findings. IMPRESSION: 1. No acute cervical spine fracture. Electronically Signed   By: Ozell Daring M.D.   On: 06/15/2024 17:06   CT CHEST ABDOMEN PELVIS WO CONTRAST Result Date: 06/15/2024 CLINICAL DATA:  Clemens, sepsis EXAM: CT CHEST, ABDOMEN AND PELVIS WITHOUT CONTRAST TECHNIQUE: Multidetector CT imaging of the chest, abdomen and pelvis was performed following the  standard protocol without IV contrast. RADIATION DOSE REDUCTION: This exam was performed according to the departmental dose-optimization program which includes automated exposure control, adjustment of the mA and/or kV according to patient size and/or use of iterative reconstruction technique. COMPARISON:  05/19/2024 FINDINGS: CT CHEST FINDINGS Cardiovascular: Unenhanced imaging of the heart is unremarkable without pericardial effusion. Normal caliber of the thoracic aorta. Assessment of the vascular lumen cannot be performed without intravenous contrast. Mediastinum/Nodes: No enlarged mediastinal, hilar, or axillary lymph nodes. Thyroid  gland, trachea, and esophagus demonstrate no significant findings. Lungs/Pleura: No acute airspace disease, effusion, or pneumothorax. Central airways are patent. Musculoskeletal: No acute or destructive bony abnormalities. Reconstructed images demonstrate no additional findings. CT ABDOMEN PELVIS FINDINGS Hepatobiliary: Unremarkable unenhanced appearance of the liver. Prior cholecystectomy. Pancreas: Unremarkable unenhanced appearance. Spleen: Unremarkable unenhanced appearance. Adrenals/Urinary Tract: The adrenals are stable. No urinary tract calculi or obstructive uropathy. Bladder is unremarkable. Stomach/Bowel: No bowel obstruction or ileus. Scattered colonic gas fluid levels are identified, which may reflect diarrhea or recent laxative/enema use. Prior gastric bypass surgery.  Vascular/Lymphatic: No significant vascular findings are present. No enlarged abdominal or pelvic lymph nodes. Reproductive: Uterus and bilateral adnexa are unremarkable. Other: No free fluid or free intraperitoneal gas. No abdominal wall hernia. Musculoskeletal: No acute or destructive bony abnormalities. Reconstructed images demonstrate no additional findings. IMPRESSION: 1. No acute intrathoracic, intra-abdominal or intrapelvic trauma identified on this unenhanced exam. 2. Scattered colonic gas fluid levels, which may reflect diarrhea or recent laxative/enema use. No bowel obstruction. Electronically Signed   By: Ozell Daring M.D.   On: 06/15/2024 17:04   CT Head Wo Contrast Result Date: 06/15/2024 CLINICAL DATA:  Seizures, hit head EXAM: CT HEAD WITHOUT CONTRAST TECHNIQUE: Contiguous axial images were obtained from the base of the skull through the vertex without intravenous contrast. RADIATION DOSE REDUCTION: This exam was performed according to the departmental dose-optimization program which includes automated exposure control, adjustment of the mA and/or kV according to patient size and/or use of iterative reconstruction technique. COMPARISON:  02/25/2024 FINDINGS: Brain: Stable areas of encephalomalacia within the left frontal, left parietal, left temporal, and right parietal regions consistent with previous infarcts. No signs of acute infarct or hemorrhage. Lateral ventricles and midline structures are stable. No acute extra-axial fluid collections. No mass effect. Vascular: No hyperdense vessel or unexpected calcification. Stable stent within the left MCA distribution. Skull: Normal. Negative for fracture or focal lesion. Sinuses/Orbits: No acute finding. Other: None. IMPRESSION: 1. No acute intracranial process. 2. Stable chronic ischemic changes as above. Electronically Signed   By: Ozell Daring M.D.   On: 06/15/2024 17:01   DG Chest Portable 1 View Result Date: 06/15/2024 CLINICAL DATA:   Clemens, seizures EXAM: PORTABLE CHEST 1 VIEW COMPARISON:  07/02/2020 FINDINGS: Single frontal view of the chest demonstrates an unremarkable cardiac silhouette. No airspace disease, effusion, or pneumothorax. No acute bony abnormalities. IMPRESSION: 1. No acute intrathoracic process. Electronically Signed   By: Ozell Daring M.D.   On: 06/15/2024 16:59   My independent interpretation of EKG: Sinus tachycardia with flattening of the T waves  Assessment/Plan Courtney Burke is a 69 y.o. 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  # Syncope and collapse - Patient presented after a syncope episode at home  followed by a seizure episode - Patient found to be hypotensive by EMS, BP remains soft on admission - Syncope likely secondary to hypovolemia in the setting of GI bleed - S/p 2 L IV NS bolus, start IV NS 100 cc/h for 20 hours - Telemetry  # GI bleed - Pt presented with a few days of dark/black diarrhea with associated generalized weakness/fatigue - Hgb of 9.5 on admission from 13.6 4 weeks ago, baseline seems to be around 11 - FOBT positive, Pt with a syncope episode prior to arrival - [facility] consulted, plan for evaluation in the morning for possible endoscopic evaluation - IV Protonix  40 mg every 12 hours - Follow up iron studies, ferritin and vitamin B12 - Trend CBC and Transfuse for Hgb goal > 7  # Seizure - Patient with a history of seizure presenting after a witnessed seizure episode at home, no further episodes since admission - Reports compliance with her Keppra , seizure likely in the setting of hypotension/GI bleed - Continue home Keppra  and Topamax   - Check Keppra  levels - Seizure  precautions  # Hypokalemia - K+ significant low at 2.6 on admission EKG showing flattening of the T waves - Likely secondary to recent dark watery stools/GI bleed - S/p total of 10 mEq KCl given in the ED, IV infiltrated - Give KCl 40 mEq  x 2 - F/u morning potassium and mag  # T2DM with hyperglycemia - Last A1c was 4.7%, appropriately not on any diabetic medications  - Glucose elevated to 214 on admission with signs consistent with possible early DKA in the setting of acute GI bleed - Continue IV hydration and start Q4H SSI with CBG monitoring - Follow-up repeat A1c  # CKD 3B - Creatinine of 1.75 stable compared to recent baseline of 1.8-2.0 - Continue IV hydration, trend renal function and avoid nephrotoxic agent  # Paroxysmal A-fib - Remains in normal sinus, rate controlled - Hold Eliquis  in the setting of GI bleed  # Hypophosphatemia # Hypomagnesemia - Phosphorus low at 2.3, mag 1.7 - Replete with IV potassium phosphate  and IV mag - Follow-up repeat phosphorus and mag  # HLD - Continue atorvastatin   # Hx of CVA - Has expressive aphasia from repeated strokes - Continue aspirin  and atorvastatin   # GERD - Continue IV PPI  # Mood disorder - Continue duloxetine  and amitriptyline   DVT prophylaxis: SCDs    Code Status: Full Code  Consults called: GI  Family Communication: Discussed results/findings and plan of admission with daughter at bedside  Severity of Illness: The appropriate patient status for this patient is INPATIENT. Inpatient status is judged to be reasonable and necessary in order to provide the required intensity of service to ensure the patient's safety. The patient's presenting symptoms, physical exam findings, and initial radiographic and laboratory data in the context of their chronic comorbidities is felt to place them at high risk for further clinical deterioration. Furthermore, it is not anticipated that the patient will be medically stable for  discharge from the hospital within 2 midnights of admission.   * I certify that at the point of admission it is my clinical judgment that the patient will require inpatient hospital care spanning beyond 2 midnights from the point of admission due to high intensity of service, high risk for further deterioration and high frequency of surveillance required.*  Level of care: Progressive   I personally spent a total of 80 minutes in the care of the patient today including preparing to see  the patient, getting/reviewing separately obtained history, performing a medically appropriate exam/evaluation, placing orders, documenting clinical information in the EHR, independently interpreting results, and communicating results.   Lou Claretta HERO, MD 06/15/2024, 10:12 PM [facility] Pager: [phone] Isaiah 41:10   If 7PM-7AM, please contact night-coverage [email or web address] Password [email or web address]     [1]  Allergies Allergen Reactions   Homatropine Itching   Iodinated Contrast Media Itching and Other (See Comments)   Doxycycline Nausea And Vomiting and Other (See Comments)   Sulfa Antibiotics Rash and Hives   Codeine Hives and Swelling    Swollen tongue   Hydrocodone Itching   Hydrocodone Bit-Homatrop Mbr Itching   Sulfamethoxazole Hives   Ace Inhibitors Cough, Itching and Rash   "

## 2024-06-15 NOTE — Progress Notes (Signed)
 Chaplain responded to trauma page as medical staff checked vitals and prepared patient for scans. No family present at this time. Chaplain assistance available, as desired.    06/15/24 1620  Spiritual Encounters  Type of Visit Initial  Care provided to: Patient  Conversation partners present during encounter Nurse  Referral source Trauma page  Reason for visit Trauma  OnCall Visit No  Spiritual Framework  Patient Stress Factors Other (Comment) (Seizures and fall)  Interventions  Spiritual Care Interventions Made  [employer] of presence)

## 2024-06-15 NOTE — ED Notes (Signed)
 C- collar removed by Laurice, MD

## 2024-06-15 NOTE — ED Provider Notes (Signed)
 " [place] EMERGENCY DEPARTMENT AT [facility] Provider Note   CSN: [id number] Arrival date & time: 06/15/24  1531     Patient presents with: Seizures   Courtney Burke is a 69 y.o. female.   69 year old female with prior medical history as detailed below presents for evaluation.  Patient arrives with EMS from home.  Family noted that the patient seemed to collapse and slumped against the wall.  Patient with apparent history of seizures.  Family is concerned that she may have had a breakthrough seizure.  She is on Keppra  1000 mg twice daily.  Patient with history of expressive aphasia status post prior stroke.  Patient is on Eliquis .  EMS administered 4 mg of Zofran  during transport.  Patient is reportedly compliant with both Keppra  and Eliquis  per family per EMS.  The history is provided by the patient, medical records and the EMS personnel.       Prior to Admission medications  Medication Sig Start Date End Date Taking? Authorizing Provider  acetaminophen  (TYLENOL ) 500 MG tablet Take 1,000 mg by mouth every 6 (six) hours as needed for moderate pain or headache.    [provider]  amitriptyline  (ELAVIL ) 50 MG tablet Take 1 tablet (50 mg total) by mouth at bedtime. 02/13/24   Whitfield Raisin, NP  aspirin  EC 81 MG tablet Take 1 tablet (81 mg total) by mouth daily. Swallow whole. 06/29/21   Whitfield Raisin, NP  atorvastatin  (LIPITOR ) 80 MG tablet Take 1 tablet (80 mg total) by mouth daily. 08/14/23   [name], MD  diazePAM , 15 MG Dose, (VALTOCO  15 MG DOSE) 2 x 7.5 MG/0.1ML LQPK Place 15 mg into the nose as needed (seizure rescue). At onset of seizure, may repeat once based on response and tolerability after >=4 hours. Maximum dose: Two doses per episode. Do not use for more than 1 episode every 5 days or more than 5 episodes per month. 02/13/24   Whitfield Raisin, NP  DULoxetine  (CYMBALTA ) 30 MG capsule TAKE 1 CAPSULE BY MOUTH EVERY DAY 10/22/23   Whitfield Raisin,  NP  ELIQUIS  5 MG TABS tablet TAKE 1 TABLET BY MOUTH TWICE A DAY 12/23/23   Camnitz, Soyla Lunger, MD  levETIRAcetam  (KEPPRA ) 1000 MG tablet Take 1 tablet (1,000 mg total) by mouth 2 (two) times daily. 02/29/24   Drusilla Sabas RAMAN, MD  NURTEC 75 MG TBDP TAKE 1 TABLET (75 MG TOTAL) BY MOUTH AS NEEDED. 09/16/23   Whitfield Raisin, NP  ondansetron  (ZOFRAN -ODT) 4 MG disintegrating tablet Take 1 tablet (4 mg total) by mouth every 8 (eight) hours as needed. 05/19/24   Griselda Norris, MD  topiramate  (TOPAMAX ) 100 MG tablet Take 1 tablet (100 mg total) by mouth 2 (two) times daily. Take 1 tablet twice daily 02/13/24   Whitfield Raisin, NP  ursodiol  (ACTIGALL ) 250 MG tablet Take 250 mg by mouth 2 (two) times daily. 07/28/20   [provider]    Allergies: Homatropine, Iodinated contrast media, Doxycycline, Sulfa antibiotics, Codeine, Hydrocodone, Hydrocodone bit-homatrop mbr, Sulfamethoxazole, and Ace inhibitors    Review of Systems  Unable to perform ROS: Acuity of condition    Updated Vital Signs BP 95/68   Pulse 80   Resp (!) 22   LMP 09/28/2012   SpO2 100%   Physical Exam Vitals and nursing note reviewed.  Constitutional:      General: She is not in acute distress.    Appearance: She is well-developed.     Comments: Patient is  alert but poorly responsive.  Blood pressure is low with initial systolics in the 60s.  HENT:     Head: Normocephalic and atraumatic.     Nose: Nose normal.  Eyes:     Conjunctiva/sclera: Conjunctivae normal.  Cardiovascular:     Rate and Rhythm: Normal rate and regular rhythm.     Heart sounds: No murmur heard. Pulmonary:     Effort: Pulmonary effort is normal. No respiratory distress.     Breath sounds: Normal breath sounds.  Abdominal:     Palpations: Abdomen is soft.     Tenderness: There is no abdominal tenderness.  Genitourinary:    Comments: DRE demonstrates brown stool which is guaiac positive. Musculoskeletal:        General: No swelling.      Cervical back: Neck supple.  Skin:    General: Skin is warm and dry.     Capillary Refill: Capillary refill takes less than 2 seconds.  Neurological:     General: No focal deficit present.     Mental Status: She is alert.     Comments: Patient is slow to respond, with improvement in blood pressure, her level of alertness improves.  She does have expressive aphasia at baseline.  She is moving all 4 extremities equally.  No apparent seizure activity noted in the ED.  Psychiatric:        Mood and Affect: Mood normal.     (all labs ordered are listed, but only abnormal results are displayed) Labs Reviewed  CBC WITH DIFFERENTIAL/PLATELET - Abnormal; Notable for the following components:      Result Value   RBC 3.45 (*)    Hemoglobin 9.5 (*)    HCT 29.2 (*)    Platelets 488 (*)    Neutro Abs 8.1 (*)    Abs Immature Granulocytes 0.14 (*)    All other components within normal limits  I-STAT CHEM 8, ED - Abnormal; Notable for the following components:   Potassium 2.5 (*)    Creatinine, Ser 1.70 (*)    Glucose, Bld 207 (*)    Calcium , Ion 0.99 (*)    TCO2 16 (*)    Hemoglobin 9.5 (*)    HCT 28.0 (*)    All other components within normal limits  I-STAT CG4 LACTIC ACID, ED - Abnormal; Notable for the following components:   Lactic Acid, Venous 3.2 (*)    All other components within normal limits  CBG MONITORING, ED - Abnormal; Notable for the following components:   Glucose-Capillary 180 (*)    All other components within normal limits  POC OCCULT BLOOD, ED - Abnormal; Notable for the following components:   Fecal Occult Bld POSITIVE (*)    All other components within normal limits  ETHANOL  COMPREHENSIVE METABOLIC PANEL WITH GFR  CBC WITH DIFFERENTIAL/PLATELET  URINALYSIS, W/ REFLEX TO CULTURE (INFECTION SUSPECTED)  URINE DRUG SCREEN  TYPE AND SCREEN  TROPONIN T, HIGH SENSITIVITY    EKG: EKG Interpretation Date/Time:  Monday June 15 2024 16:02:29 EST Ventricular Rate:   115 PR Interval:  191 QRS Duration:  72 QT Interval:  349 QTC Calculation: 483 R Axis:   1  Text Interpretation: Sinus tachycardia Borderline T abnormalities, lateral leads Confirmed by Laurice Coy [phone]) on 06/15/2024 4:19:04 PM  Radiology: No results found.   Procedures   Medications Ordered in the ED  levETIRAcetam  (KEPPRA ) undiluted injection 1,000 mg (1,000 mg Intravenous Given 06/15/24 1546)  sodium chloride  0.9 % bolus 1,000 mL (1,000  mLs Intravenous New Bag/Given 06/15/24 1547)  pantoprazole  (PROTONIX ) injection 80 mg (80 mg Intravenous Given 06/15/24 1604)  sodium chloride  0.9 % bolus 1,000 mL (1,000 mLs Intravenous New Bag/Given 06/15/24 1606)                                    Medical Decision Making Patient presents after possible syncope, near syncope that occurred at home.  Family was concerned about possible seizure activity.  On arrival the patient is noted to be slow to respond, hypotensive into the 60s.  Stool guaiac is positive.  Blood pressure improved with IV fluids.  Initial hemoglobin is 9.5 on i-STAT.  Hemoglobin 4 weeks ago was 13.6.  Protonix  administered for suspected GI bleed.  1000 mg of Keppra  given on arrival out of concern for possible seizure activity.  No seizure activity observed here in the ED.  Patient does not appear to be postictal, she appears to be further responsive secondary to symptomatic hypotension.  [facility] is aware of case and will consult.  Patient to be kept n.p.o. until tomorrow for possible GI intervention.  Hospitalist service made aware of case.    Amount and/or Complexity of Data Reviewed Labs: ordered. Radiology: ordered.  Risk Prescription drug management.   CRITICAL CARE Performed by: Maude JAYSON Galloway   Total critical care time: 45 minutes  Critical care time was exclusive of separately billable procedures and treating other patients.  Critical care was necessary to treat or prevent imminent or  life-threatening deterioration.  Critical care was time spent personally by me on the following activities: development of treatment plan with patient and/or surrogate as well as nursing, discussions with consultants, evaluation of patient's response to treatment, examination of patient, obtaining history from patient or surrogate, ordering and performing treatments and interventions, ordering and review of laboratory studies, ordering and review of radiographic studies, pulse oximetry and re-evaluation of patient's condition.      Final diagnoses:  Syncope, unspecified syncope type  Gastrointestinal hemorrhage, unspecified gastrointestinal hemorrhage type  Hypotension, unspecified hypotension type    ED Discharge Orders     None          Galloway Maude JAYSON, MD 06/15/24 1820  "

## 2024-06-15 NOTE — ED Notes (Signed)
 In and out cath completed with RN

## 2024-06-15 NOTE — ED Notes (Signed)
 Unable to drawn blood off IV, phlebotomy requested.

## 2024-06-15 NOTE — ED Triage Notes (Signed)
 Pt bibems from home. Family witnessed the pt hitting her head on the wall. ES reports 2 absent seizures with them in route. Last seizure was about a month ago. 4mg  of zofran  given. Hx of seizures, hx of a stroke about 3 yrs ago. Pt takes Eliquis  and Keppra .

## 2024-06-15 NOTE — Progress Notes (Signed)
 Orthopedic Tech Progress Note Patient Details:  Courtney Burke 08/18/1955 [id number] Level 2 Trauma. Not needed Patient ID: Anokhi Shannon, female   DOB: 10-22-1955, 69 y.o.   MRN: [id number]  Efrain DELENA Cos 06/15/2024, 3:40 PM

## 2024-06-16 ENCOUNTER — Encounter (HOSPITAL_COMMUNITY)
Admission: EM | Disposition: A | Discharge status: Home Health | Payer: Self-pay | Source: Home / Self Care | Attending: Internal Medicine

## 2024-06-16 ENCOUNTER — Other Ambulatory Visit: Payer: Self-pay

## 2024-06-16 ENCOUNTER — Encounter (HOSPITAL_COMMUNITY): Payer: Self-pay | Admitting: Student

## 2024-06-16 ENCOUNTER — Inpatient Hospital Stay (HOSPITAL_COMMUNITY)

## 2024-06-16 DIAGNOSIS — Z7901 Long term (current) use of anticoagulants: Secondary | ICD-10-CM

## 2024-06-16 DIAGNOSIS — N1832 Chronic kidney disease, stage 3b: Secondary | ICD-10-CM

## 2024-06-16 DIAGNOSIS — R195 Other fecal abnormalities: Secondary | ICD-10-CM

## 2024-06-16 DIAGNOSIS — I959 Hypotension, unspecified: Secondary | ICD-10-CM

## 2024-06-16 DIAGNOSIS — Z9884 Bariatric surgery status: Secondary | ICD-10-CM

## 2024-06-16 DIAGNOSIS — D509 Iron deficiency anemia, unspecified: Secondary | ICD-10-CM

## 2024-06-16 DIAGNOSIS — E1122 Type 2 diabetes mellitus with diabetic chronic kidney disease: Secondary | ICD-10-CM

## 2024-06-16 DIAGNOSIS — K289 Gastrojejunal ulcer, unspecified as acute or chronic, without hemorrhage or perforation: Secondary | ICD-10-CM

## 2024-06-16 DIAGNOSIS — I951 Orthostatic hypotension: Secondary | ICD-10-CM | POA: Diagnosis not present

## 2024-06-16 DIAGNOSIS — K922 Gastrointestinal hemorrhage, unspecified: Secondary | ICD-10-CM | POA: Insufficient documentation

## 2024-06-16 DIAGNOSIS — K2960 Other gastritis without bleeding: Secondary | ICD-10-CM

## 2024-06-16 DIAGNOSIS — E1165 Type 2 diabetes mellitus with hyperglycemia: Secondary | ICD-10-CM

## 2024-06-16 DIAGNOSIS — I129 Hypertensive chronic kidney disease with stage 1 through stage 4 chronic kidney disease, or unspecified chronic kidney disease: Secondary | ICD-10-CM

## 2024-06-16 HISTORY — PX: BIOPSY OF SKIN SUBCUTANEOUS TISSUE AND/OR MUCOUS MEMBRANE: SHX6741

## 2024-06-16 HISTORY — PX: ESOPHAGOGASTRODUODENOSCOPY: SHX5428

## 2024-06-16 LAB — CBC
HCT: 27 % — ABNORMAL LOW (ref 36.0–46.0)
Hemoglobin: 9.1 g/dL — ABNORMAL LOW (ref 12.0–15.0)
MCH: 28.3 pg (ref 26.0–34.0)
MCHC: 33.7 g/dL (ref 30.0–36.0)
MCV: 83.9 fL (ref 80.0–100.0)
Platelets: 396 K/uL (ref 150–400)
RBC: 3.22 MIL/uL — ABNORMAL LOW (ref 3.87–5.11)
RDW: 14.5 % (ref 11.5–15.5)
WBC: 8.5 K/uL (ref 4.0–10.5)
nRBC: 0 % (ref 0.0–0.2)

## 2024-06-16 LAB — HEMOGLOBIN A1C
Hgb A1c MFr Bld: 5.9 % — ABNORMAL HIGH (ref 4.8–5.6)
Mean Plasma Glucose: 122.63 mg/dL

## 2024-06-16 LAB — IRON AND TIBC
Iron: 21 ug/dL — ABNORMAL LOW (ref 28–170)
Saturation Ratios: 13 % (ref 10.4–31.8)
TIBC: 168 ug/dL — ABNORMAL LOW (ref 250–450)
UIBC: 147 ug/dL

## 2024-06-16 LAB — MAGNESIUM: Magnesium: 2.3 mg/dL (ref 1.7–2.4)

## 2024-06-16 LAB — GLUCOSE, CAPILLARY
Glucose-Capillary: 79 mg/dL (ref 70–99)
Glucose-Capillary: 91 mg/dL (ref 70–99)
Glucose-Capillary: 95 mg/dL (ref 70–99)

## 2024-06-16 LAB — VITAMIN B12: Vitamin B-12: 1427 pg/mL — ABNORMAL HIGH (ref 180–914)

## 2024-06-16 LAB — BASIC METABOLIC PANEL WITH GFR
Anion gap: 12 (ref 5–15)
BUN: 17 mg/dL (ref 8–23)
CO2: 17 mmol/L — ABNORMAL LOW (ref 22–32)
Calcium: 7.8 mg/dL — ABNORMAL LOW (ref 8.9–10.3)
Chloride: 106 mmol/L (ref 98–111)
Creatinine, Ser: 1.67 mg/dL — ABNORMAL HIGH (ref 0.44–1.00)
GFR, Estimated: 33 mL/min — ABNORMAL LOW
Glucose, Bld: 96 mg/dL (ref 70–99)
Potassium: 3.5 mmol/L (ref 3.5–5.1)
Sodium: 135 mmol/L (ref 135–145)

## 2024-06-16 LAB — CBG MONITORING, ED
Glucose-Capillary: 81 mg/dL (ref 70–99)
Glucose-Capillary: 84 mg/dL (ref 70–99)

## 2024-06-16 LAB — FERRITIN: Ferritin: 774 ng/mL — ABNORMAL HIGH (ref 11–307)

## 2024-06-16 LAB — PHOSPHORUS: Phosphorus: 3.2 mg/dL (ref 2.5–4.6)

## 2024-06-16 MED ORDER — PHENYLEPHRINE HCL (PRESSORS) 10 MG/ML IV SOLN
INTRAVENOUS | Status: DC | PRN
  Administered 2024-06-16: 160 ug via INTRAVENOUS

## 2024-06-16 MED ORDER — PROPOFOL 10 MG/ML IV BOLUS
INTRAVENOUS | Status: DC | PRN
  Administered 2024-06-16: 50 mg via INTRAVENOUS
  Administered 2024-06-16: 80 mg via INTRAVENOUS
  Administered 2024-06-16: 20 mg via INTRAVENOUS

## 2024-06-16 MED ORDER — SUCRALFATE 1 GM/10ML PO SUSP
1.0000 g | Freq: Four times a day (QID) | ORAL | Status: DC
  Administered 2024-06-16 – 2024-06-18 (×8): 1 g via ORAL
  Filled 2024-06-16 (×9): qty 10

## 2024-06-16 MED ORDER — PANTOPRAZOLE SODIUM 40 MG IV SOLR
40.0000 mg | Freq: Two times a day (BID) | INTRAVENOUS | Status: DC
  Administered 2024-06-16 – 2024-06-18 (×5): 40 mg via INTRAVENOUS
  Filled 2024-06-16 (×5): qty 10

## 2024-06-16 MED ORDER — IPRATROPIUM-ALBUTEROL 0.5-2.5 (3) MG/3ML IN SOLN
3.0000 mL | Freq: Once | RESPIRATORY_TRACT | Status: AC
  Administered 2024-06-16: 3 mL via RESPIRATORY_TRACT

## 2024-06-16 MED ORDER — SODIUM CHLORIDE 0.9 % IV SOLN: INTRAVENOUS | Status: AC

## 2024-06-16 MED ORDER — LIDOCAINE 2% (20 MG/ML) 5 ML SYRINGE
INTRAMUSCULAR | Status: DC | PRN
  Administered 2024-06-16: 100 mg via INTRAVENOUS

## 2024-06-16 MED ORDER — IPRATROPIUM-ALBUTEROL 0.5-2.5 (3) MG/3ML IN SOLN
RESPIRATORY_TRACT | Status: AC
  Filled 2024-06-16: qty 3

## 2024-06-16 MED ORDER — INSULIN ASPART 100 UNIT/ML IJ SOLN: 0.0000 [IU] | INTRAMUSCULAR | Status: DC

## 2024-06-16 NOTE — Progress Notes (Signed)
 " Progress Note    Courtney Burke   FMW:[id number]  DOB: January 11, 1956  DOA: 06/15/2024     1 PCP: Courtney Clotilda SAUNDERS, MD  Initial CC: Seizure-like activity  Hospital Course: [name] is a 69 y.o. female with PMH CVA with residual expressive aphasia, seizure disorder, HTN, HLD, gastric bypass, DM II, mood disorder, GERD, PAF, asthma, CKD 3B who presented for evaluation after an episode of syncope and seizure. Per daughter, it was reported that the patient had been weak and fatigued for a few days.  Prior to admission she was getting up for doctors appointment and was reported to start banging her head against the wall then collapsed to the floor.  No loss of consciousness noted but became stiff with shaking and eyes rolling back with associated bowel and urinary incontinence. Also was reported she was having dark watery stools for couple days.  Last seizure approximately 1 month ago and has been compliant on medications.  GI was also consulted on admission.  Hgb 9.5 g/dL on admission and previously around 11 to 12 g/dL.  FOBT positive.   Assessment/Plan  Presyncope  - Patient presented after a pre-syncope episode at home followed by a seizure episode - Patient found to be hypotensive by EMS, BP remains soft on admission - etiology potentially hypotension related vs trauma from hitting head causing breakthru seizure (need to clarify story on admission still)  GIB - Pt presented with a few days of dark/black diarrhea with associated generalized weakness/fatigue - Hgb of 9.5 on admission from 13.6 about 4 weeks ago, baseline seems to be around 11 - FOBT positive - appreciate GI evaluation - plan is for EGD today; given history of gastric bypass as well - continue protonix   - holding asa and eliquis  for now  Seizure - Patient with a history of seizure presenting after a witnessed seizure episode at home, no further episodes since admission - Reports compliance with her Keppra ,  seizure possibly in the setting of hypotension/GI bleed - Continue home Keppra  and Topamax   - Check Keppra  levels - Seizure precautions  DMII with hyperglycemia - Last A1c was 4.7%, appropriately not on any diabetic medications  - Repeat A1c 5.9% - Glucose elevated to 214 on admission - Continue IV hydration and start Q4H SSI with CBG monitoring   CKD 3B - Creatinine of 1.75 stable compared to recent baseline of 1.8-2.0 - Continue IV hydration, trend renal function   PAF - Remains in normal sinus, rate controlled - Hold Eliquis  in the setting of GI bleed   Hypophosphatemia Hypomagnesemia Hypokalemia  Replete as needed   HLD - Continue atorvastatin    Hx of CVA - Has expressive aphasia from repeated strokes - hold aspirin  - continue statin    GERD - Continue IV PPI   Mood disorder - Continue duloxetine  and amitriptyline   Interval History:  Seen this morning in the ER.  Baseline expressive aphasia noted. Able to follow commands and appears stable otherwise.  Antimicrobials:   Consultants:  GI  Procedures:    DVT prophylaxis:  SCDs Start: 06/15/24 1943   Code Status:   Code Status: Full Code  Barriers to discharge: None Therapy evaluation: PT Orders:   PT Follow up Rec:   Disposition Plan: Home Status is: Inpatient  Mobility Assessment (Last 72 Hours)     Mobility Assessment   No documentation.     Diet: Diet Orders (From admission, onward)     Start     Ordered  06/16/24 0001  Diet NPO time specified Except for: Sips with Meds  Diet effective midnight       Question:  Except for  Answer:  Sips with Meds   06/15/24 1943            Objective: Blood pressure 97/64, pulse (!) 132, temperature (!) 97.3 F (36.3 C), temperature source Temporal, resp. rate 10, last menstrual period 09/28/2012, SpO2 98%.  Examination:  Physical Exam Constitutional:      General: She is not in acute distress.    Appearance: Normal appearance.  HENT:      Head: Normocephalic and atraumatic.     Mouth/Throat:     Mouth: Mucous membranes are moist.  Eyes:     Extraocular Movements: Extraocular movements intact.  Cardiovascular:     Rate and Rhythm: Normal rate and regular rhythm.  Pulmonary:     Effort: Pulmonary effort is normal. No respiratory distress.     Breath sounds: Normal breath sounds. No wheezing.  Abdominal:     General: Bowel sounds are normal. There is no distension.     Palpations: Abdomen is soft.     Tenderness: There is no abdominal tenderness.  Musculoskeletal:        General: Normal range of motion.     Cervical back: Normal range of motion and neck supple.  Skin:    General: Skin is warm and dry.  Neurological:     Mental Status: She is alert.     Comments: Underlying expressive aphasia appreciated which is baseline      Data Reviewed: Results for orders placed or performed during the hospital encounter of 06/15/24 (from the past 24 hours)  CBG monitoring, ED     Status: Abnormal   Collection Time: 06/15/24  3:47 PM  Result Value Ref Range   Glucose-Capillary 180 (H) 70 - 99 mg/dL  I-stat chem 8, ED     Status: Abnormal   Collection Time: 06/15/24  3:55 PM  Result Value Ref Range   Sodium 138 135 - 145 mmol/L   Potassium 2.5 (LL) 3.5 - 5.1 mmol/L   Chloride 104 98 - 111 mmol/L   BUN 17 8 - 23 mg/dL   Creatinine, Ser 8.29 (H) 0.44 - 1.00 mg/dL   Glucose, Bld 792 (H) 70 - 99 mg/dL   Calcium , Ion 0.99 (L) 1.15 - 1.40 mmol/L   TCO2 16 (L) 22 - 32 mmol/L   Hemoglobin 9.5 (L) 12.0 - 15.0 g/dL   HCT 71.9 (L) 63.9 - 53.9 %   Comment NOTIFIED PHYSICIAN   I-Stat Lactic Acid     Status: Abnormal   Collection Time: 06/15/24  3:55 PM  Result Value Ref Range   Lactic Acid, Venous 3.2 (HH) 0.5 - 1.9 mmol/L   Comment NOTIFIED PHYSICIAN   POC occult blood, ED     Status: Abnormal   Collection Time: 06/15/24  4:00 PM  Result Value Ref Range   Fecal Occult Bld POSITIVE (A) NEGATIVE  Troponin T, High  Sensitivity     Status: Abnormal   Collection Time: 06/15/24  4:03 PM  Result Value Ref Range   Troponin T High Sensitivity 41 (H) 0 - 19 ng/L  Ethanol     Status: None   Collection Time: 06/15/24  4:03 PM  Result Value Ref Range   Alcohol, Ethyl (B) <15 <15 mg/dL  Comprehensive metabolic panel     Status: Abnormal   Collection Time: 06/15/24  4:03 PM  Result Value  Ref Range   Sodium 135 135 - 145 mmol/L   Potassium 2.6 (LL) 3.5 - 5.1 mmol/L   Chloride 103 98 - 111 mmol/L   CO2 16 (L) 22 - 32 mmol/L   Glucose, Bld 214 (H) 70 - 99 mg/dL   BUN 18 8 - 23 mg/dL   Creatinine, Ser 8.24 (H) 0.44 - 1.00 mg/dL   Calcium  7.6 (L) 8.9 - 10.3 mg/dL   Total Protein 5.9 (L) 6.5 - 8.1 g/dL   Albumin 2.6 (L) 3.5 - 5.0 g/dL   AST 31 15 - 41 U/L   ALT 9 0 - 44 U/L   Alkaline Phosphatase 100 38 - 126 U/L   Total Bilirubin 0.5 0.0 - 1.2 mg/dL   GFR, Estimated 31 (L) >60 mL/min   Anion gap 16 (H) 5 - 15  CBC with Differential/Platelet     Status: Abnormal   Collection Time: 06/15/24  4:06 PM  Result Value Ref Range   WBC 10.1 4.0 - 10.5 K/uL   RBC 3.45 (L) 3.87 - 5.11 MIL/uL   Hemoglobin 9.5 (L) 12.0 - 15.0 g/dL   HCT 70.7 (L) 63.9 - 53.9 %   MCV 84.6 80.0 - 100.0 fL   MCH 27.5 26.0 - 34.0 pg   MCHC 32.5 30.0 - 36.0 g/dL   RDW 85.5 88.4 - 84.4 %   Platelets 488 (H) 150 - 400 K/uL   nRBC 0.0 0.0 - 0.2 %   Neutrophils Relative % 82 %   Neutro Abs 8.1 (H) 1.7 - 7.7 K/uL   Lymphocytes Relative 12 %   Lymphs Abs 1.2 0.7 - 4.0 K/uL   Monocytes Relative 4 %   Monocytes Absolute 0.4 0.1 - 1.0 K/uL   Eosinophils Relative 1 %   Eosinophils Absolute 0.1 0.0 - 0.5 K/uL   Basophils Relative 0 %   Basophils Absolute 0.0 0.0 - 0.1 K/uL   Immature Granulocytes 1 %   Abs Immature Granulocytes 0.14 (H) 0.00 - 0.07 K/uL  ABO/Rh     Status: None   Collection Time: 06/15/24  4:06 PM  Result Value Ref Range   ABO/RH(D)      O POS Performed at [facility], [address], [place], [place]  [address]   Type and screen [facility]     Status: None   Collection Time: 06/15/24  4:28 PM  Result Value Ref Range   ABO/RH(D) O POS    Antibody Screen NEG    Sample Expiration      06/18/2024,2359 Performed at [facility], [address], [place], [place] [address]   Troponin T, High Sensitivity     Status: Abnormal   Collection Time: 06/15/24  7:40 PM  Result Value Ref Range   Troponin T High Sensitivity 41 (H) 0 - 19 ng/L  I-Stat Lactic Acid     Status: None   Collection Time: 06/15/24  7:51 PM  Result Value Ref Range   Lactic Acid, Venous 1.8 0.5 - 1.9 mmol/L  Magnesium      Status: None   Collection Time: 06/15/24  7:55 PM  Result Value Ref Range   Magnesium  1.7 1.7 - 2.4 mg/dL  Phosphorus     Status: Abnormal   Collection Time: 06/15/24  7:55 PM  Result Value Ref Range   Phosphorus 2.3 (L) 2.5 - 4.6 mg/dL  Urinalysis, w/ Reflex to Culture (Infection Suspected) -Urine, Clean Catch     Status: Abnormal  Collection Time: 06/15/24 11:07 PM  Result Value Ref Range   Specimen Source URINE, CLEAN CATCH    Color, Urine YELLOW YELLOW   APPearance CLEAR CLEAR   Specific Gravity, Urine 1.011 1.005 - 1.030   pH 6.0 5.0 - 8.0   Glucose, UA NEGATIVE NEGATIVE mg/dL   Hgb urine dipstick NEGATIVE NEGATIVE   Bilirubin Urine NEGATIVE NEGATIVE   Ketones, ur NEGATIVE NEGATIVE mg/dL   Protein, ur 30 (A) NEGATIVE mg/dL   Nitrite NEGATIVE NEGATIVE   Leukocytes,Ua NEGATIVE NEGATIVE   RBC / HPF 0-5 0 - 5 RBC/hpf   WBC, UA 0-5 0 - 5 WBC/hpf   Bacteria, UA NONE SEEN NONE SEEN   Squamous Epithelial / HPF 0-5 0 - 5 /HPF   Mucus PRESENT   Urine rapid drug screen (hosp performed)     Status: None   Collection Time: 06/15/24 11:08 PM  Result Value Ref Range   Opiates NEGATIVE NEGATIVE   Cocaine NEGATIVE NEGATIVE   Benzodiazepines NEGATIVE NEGATIVE   Amphetamines NEGATIVE NEGATIVE   Tetrahydrocannabinol NEGATIVE NEGATIVE   Barbiturates NEGATIVE NEGATIVE   Methadone  Scn, Ur NEGATIVE NEGATIVE   Fentanyl  NEGATIVE NEGATIVE  Basic metabolic panel     Status: Abnormal   Collection Time: 06/16/24  3:10 AM  Result Value Ref Range   Sodium 135 135 - 145 mmol/L   Potassium 3.5 3.5 - 5.1 mmol/L   Chloride 106 98 - 111 mmol/L   CO2 17 (L) 22 - 32 mmol/L   Glucose, Bld 96 70 - 99 mg/dL   BUN 17 8 - 23 mg/dL   Creatinine, Ser 8.32 (H) 0.44 - 1.00 mg/dL   Calcium  7.8 (L) 8.9 - 10.3 mg/dL   GFR, Estimated 33 (L) >60 mL/min   Anion gap 12 5 - 15  CBC     Status: Abnormal   Collection Time: 06/16/24  3:10 AM  Result Value Ref Range   WBC 8.5 4.0 - 10.5 K/uL   RBC 3.22 (L) 3.87 - 5.11 MIL/uL   Hemoglobin 9.1 (L) 12.0 - 15.0 g/dL   HCT 72.9 (L) 63.9 - 53.9 %   MCV 83.9 80.0 - 100.0 fL   MCH 28.3 26.0 - 34.0 pg   MCHC 33.7 30.0 - 36.0 g/dL   RDW 85.4 88.4 - 84.4 %   Platelets 396 150 - 400 K/uL   nRBC 0.0 0.0 - 0.2 %  Ferritin     Status: Abnormal   Collection Time: 06/16/24  3:10 AM  Result Value Ref Range   Ferritin 774 (H) 11 - 307 ng/mL  Iron and TIBC     Status: Abnormal   Collection Time: 06/16/24  3:10 AM  Result Value Ref Range   Iron 21 (L) 28 - 170 ug/dL   TIBC 831 (L) 749 - 549 ug/dL   Saturation Ratios 13 10.4 - 31.8 %   UIBC 147 ug/dL  Vitamin B12     Status: Abnormal   Collection Time: 06/16/24  3:10 AM  Result Value Ref Range   Vitamin B-12 1,427 (H) 180 - 914 pg/mL  Hemoglobin A1c     Status: Abnormal   Collection Time: 06/16/24  3:10 AM  Result Value Ref Range   Hgb A1c MFr Bld 5.9 (H) 4.8 - 5.6 %   Mean Plasma Glucose 122.63 mg/dL  Magnesium      Status: None   Collection Time: 06/16/24  3:10 AM  Result Value Ref Range   Magnesium  2.3 1.7 - 2.4 mg/dL  Phosphorus     Status: None   Collection Time: 06/16/24  3:10 AM  Result Value Ref Range   Phosphorus 3.2 2.5 - 4.6 mg/dL  CBG monitoring, ED     Status: None   Collection Time: 06/16/24  4:01 AM  Result Value Ref Range   Glucose-Capillary 81 70 - 99 mg/dL  CBG monitoring,  ED     Status: None   Collection Time: 06/16/24  7:46 AM  Result Value Ref Range   Glucose-Capillary 84 70 - 99 mg/dL    I have reviewed pertinent nursing notes, vitals, labs, and images as necessary. I have ordered labwork to follow up on as indicated.  I have reviewed the last notes from staff over past 24 hours. I have discussed patient's care plan and test results with nursing staff, CM/SW, and other staff as appropriate.  Old records reviewed in assessment of this patient  Time spent: Greater than 50% of the 55 minute visit was spent in counseling/coordination of care for the patient as laid out in the A&P.   LOS: 1 day   Alm Apo, MD [facility] 06/16/2024, 1:18 PM "

## 2024-06-16 NOTE — Consult Note (Addendum)
 "  Referring Provider: EDP Primary Care Physician:  [name], MD Primary Gastroenterologist:  Sampson, seen by Dr. Avram as inpatient only  Reason for Consultation:  GI bleed  HPI: Courtney Burke is a 69 y.o. 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 Per daughter, patient has been weak/fatigued over the last few days. On 1/12, as she was getting up for doctor's appointment, she started banging her head against the wall and collapsed on the floor. She did not lose consciousness, however, when she sat patient up, patient's body became stiff with mild shaking of the upper extremities and her eyes rolled back. Patient had a bowel and urinary incontinence during the episode so they called EMS for evaluation.  Reports that patient has recently dark watery stools over the last 2 days and a mild cough but no fevers, chills, nausea, vomiting or abdominal pain.  Reports patient's last seizure was a 1 month ago.  Patient has been compliant with her Keppra  and Eliquis  with last dose of Eliquis  on 1/12.  In the ED found to have a Hgb 9.5-->9.1 grams down from 13.6 grams 4 weeks ago although it looks like her baseline is probably closer to 11 grams.  BUN normal.  Heme positive stool.  Iron studies not C/W IDA.  Patient unable to provide any history.  History obtained from the chart, information obtained from her daughter, [name].   CT scan of the abdomen and pelvis W/O contrast: IMPRESSION: 1. No acute intrathoracic, intra-abdominal or intrapelvic trauma identified on this unenhanced exam. 2. Scattered colonic gas fluid levels, which may reflect diarrhea or recent laxative/enema use. No bowel obstruction.  EGD 11/2021: - Normal esophagus. - A  gastrojejunostomy was found, characterized by healthy appearing mucosa. There was an afferent and effernt limb. Both NL - Normal examined jejunum. - No specimens collected.   Past Medical History:  Diagnosis Date   Acute ischemic left MCA stroke (HCC) 06/30/2020   Acute ischemic stroke (HCC)    Acute stroke due to occlusion of left middle cerebral artery (HCC) 06/30/2020   Age-related nuclear cataract of both eyes 09/28/2015   Arthralgia of multiple joints 09/28/2015   Asthma    Borderline diabetes    Cerebrovascular accident (CVA) (HCC)    Chronic bilateral thoracic back pain 06/05/2018   Chronic renal insufficiency, stage 1 08/11/2014   Diabetes mellitus type 2 in obese    Dyslipidemia    Dysphagia, post-stroke    Erosive gastritis 07/20/2019   Formatting of this note might be different from the original. On EGD 07/2019   Esophagitis 07/20/2019   Formatting of this note might be different from the original. Added automatically from request for surgery [id number]  Formatting of this note might be different from the original. On EGD 07/2019   Essential hypertension 09/28/2015   Essential thrombocytosis (HCC) 09/28/2015   Gallstones 08/18/2019   Formatting of this note might be different from the original. 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   Morbid obesity (HCC) 05/21/2013  Seizures (HCC)    Stage 3b chronic kidney disease (HCC)    Status post gastric bypass for obesity 09/28/2015    Past Surgical History:  Procedure Laterality Date   ANKLE SURGERY     CARPAL TUNNEL RELEASE     carpel tunnel     ESOPHAGOGASTRODUODENOSCOPY (EGD) WITH PROPOFOL  N/A 11/14/2021    Procedure: ESOPHAGOGASTRODUODENOSCOPY (EGD) WITH PROPOFOL ;  Surgeon: Avram Lupita BRAVO, MD;  Location: [facility] ENDOSCOPY;  Service: Gastroenterology;  Laterality: N/A;   IR ANGIO INTRA EXTRACRAN SEL COM CAROTID INNOMINATE BILAT MOD SED  01/17/2021   IR ANGIO VERTEBRAL SEL SUBCLAVIAN INNOMINATE UNI R MOD SED  01/17/2021   IR CT HEAD LTD  06/30/2020   IR INTRA CRAN STENT  06/30/2020   IR PERCUTANEOUS ART THROMBECTOMY/INFUSION INTRACRANIAL INC DIAG ANGIO  06/30/2020   IR RADIOLOGIST EVAL & MGMT  08/26/2020   IR US  GUIDE VASC ACCESS RIGHT  01/17/2021   RADIOLOGY WITH ANESTHESIA N/A 06/30/2020   Procedure: RADIOLOGY WITH ANESTHESIA;  Surgeon: Radiologist, Medication, MD;  Location: [facility] OR;  Service: Radiology;  Laterality: N/A;    Prior to Admission medications  Medication Sig Start Date End Date Taking? Authorizing Provider  acetaminophen  (TYLENOL ) 500 MG tablet Take 1,000 mg by mouth every 6 (six) hours as needed for moderate pain or headache.   Yes [provider]  amitriptyline  (ELAVIL ) 50 MG tablet Take 1 tablet (50 mg total) by mouth at bedtime. 02/13/24  Yes McCue, Harlene, NP  apixaban  (ELIQUIS ) 5 MG TABS tablet Take 5 mg by mouth 2 (two) times daily.   Yes [provider]  aspirin  EC 81 MG tablet Take 1 tablet (81 mg total) by mouth daily. Swallow whole. 06/29/21  Yes McCue, Harlene, NP  atorvastatin  (LIPITOR ) 80 MG tablet Take 1 tablet (80 mg total) by mouth daily. 08/14/23  Yes [name], MD  diazePAM , 15 MG Dose, (VALTOCO  15 MG DOSE) 2 x 7.5 MG/0.1ML LQPK Place 15 mg into the nose as needed (seizure rescue). At onset of seizure, may repeat once based on response and tolerability after >=4 hours. Maximum dose: Two doses per episode. Do not use for more than 1 episode every 5 days or more than 5 episodes per month. 02/13/24  Yes McCue, Harlene, NP  DULoxetine  (CYMBALTA ) 30 MG capsule TAKE 1 CAPSULE BY MOUTH EVERY DAY 10/22/23  Yes McCue, Harlene, NP  levETIRAcetam  (KEPPRA ) 1000 MG  tablet Take 1 tablet (1,000 mg total) by mouth 2 (two) times daily. 02/29/24  Yes Drusilla Sabas RAMAN, MD  omeprazole (PRILOSEC) 40 MG capsule Take 40 mg by mouth daily. 02/29/24  Yes [provider]  Rimegepant Sulfate 75 MG TBDP Take 75 mg by mouth as needed (migraine).   Yes [provider]  topiramate  (TOPAMAX ) 100 MG tablet Take 1 tablet (100 mg total) by mouth 2 (two) times daily. Take 1 tablet twice daily 02/13/24  Yes McCue, Harlene, NP  ursodiol  (ACTIGALL ) 250 MG tablet Take 250 mg by mouth 2 (two) times daily. 07/28/20  Yes [provider]    Current Facility-Administered Medications  Medication Dose Route Frequency Provider Last Rate Last Admin   0.9 %  sodium chloride  infusion   Intravenous Continuous Amponsah, Prosper M, MD 100 mL/hr at 06/16/24 0249 New Bag at 06/16/24 0249   acetaminophen  (TYLENOL ) tablet 650 mg  650 mg Oral Q6H PRN Amponsah, Prosper M, MD       Or   acetaminophen  (TYLENOL ) suppository 650 mg  650 mg Rectal Q6H  PRN Amponsah, Prosper M, MD       amitriptyline  (ELAVIL ) tablet 50 mg  50 mg Oral QHS Amponsah, Prosper M, MD   50 mg at 06/16/24 0020   atorvastatin  (LIPITOR ) tablet 80 mg  80 mg Oral Daily Amponsah, Prosper M, MD       bisacodyl  (DULCOLAX) EC tablet 5 mg  5 mg Oral Daily PRN Amponsah, Prosper M, MD       DULoxetine  (CYMBALTA ) DR capsule 30 mg  30 mg Oral Daily Amponsah, Prosper M, MD       insulin  aspart (novoLOG ) injection 0-9 Units  0-9 Units Subcutaneous Q4H Lou Claretta HERO, MD       levETIRAcetam  (KEPPRA ) tablet 1,000 mg  1,000 mg Oral BID Lou Claretta HERO, MD       ondansetron  (ZOFRAN ) tablet 4 mg  4 mg Oral Q6H PRN Amponsah, Prosper M, MD       Or   ondansetron  (ZOFRAN ) injection 4 mg  4 mg Intravenous Q6H PRN Amponsah, Prosper M, MD       pantoprazole  (PROTONIX ) injection 40 mg  40 mg Intravenous Q12H Lou Claretta HERO, MD       senna-docusate (Senokot-S) tablet 1 tablet  1 tablet Oral QHS PRN Lou Claretta HERO, MD        topiramate  (TOPAMAX ) tablet 100 mg  100 mg Oral BID Lou Claretta HERO, MD   100 mg at 06/16/24 0020   ursodiol  (ACTIGALL ) capsule 300 mg  300 mg Oral BID Amponsah, Prosper M, MD   300 mg at 06/16/24 9977   Current Outpatient Medications  Medication Sig Dispense Refill   acetaminophen  (TYLENOL ) 500 MG tablet Take 1,000 mg by mouth every 6 (six) hours as needed for moderate pain or headache.     amitriptyline  (ELAVIL ) 50 MG tablet Take 1 tablet (50 mg total) by mouth at bedtime. 90 tablet 3   apixaban  (ELIQUIS ) 5 MG TABS tablet Take 5 mg by mouth 2 (two) times daily.     aspirin  EC 81 MG tablet Take 1 tablet (81 mg total) by mouth daily. Swallow whole.     atorvastatin  (LIPITOR ) 80 MG tablet Take 1 tablet (80 mg total) by mouth daily. 90 tablet 3   diazePAM , 15 MG Dose, (VALTOCO  15 MG DOSE) 2 x 7.5 MG/0.1ML LQPK Place 15 mg into the nose as needed (seizure rescue). At onset of seizure, may repeat once based on response and tolerability after >=4 hours. Maximum dose: Two doses per episode. Do not use for more than 1 episode every 5 days or more than 5 episodes per month. 5 each 5   DULoxetine  (CYMBALTA ) 30 MG capsule TAKE 1 CAPSULE BY MOUTH EVERY DAY 90 capsule 4   levETIRAcetam  (KEPPRA ) 1000 MG tablet Take 1 tablet (1,000 mg total) by mouth 2 (two) times daily. 60 tablet 3   omeprazole (PRILOSEC) 40 MG capsule Take 40 mg by mouth daily.     Rimegepant Sulfate 75 MG TBDP Take 75 mg by mouth as needed (migraine).     topiramate  (TOPAMAX ) 100 MG tablet Take 1 tablet (100 mg total) by mouth 2 (two) times daily. Take 1 tablet twice daily 180 tablet 3   ursodiol  (ACTIGALL ) 250 MG tablet Take 250 mg by mouth 2 (two) times daily.      Allergies as of 06/15/2024 - Review Complete 06/15/2024  Allergen Reaction Noted   Codeine Hives and Swelling 09/28/2012   Homatropine Itching 05/15/2017   Iodinated contrast media Itching and Other (  See Comments) 07/03/2016   Doxycycline Nausea And Vomiting  09/28/2015   Sulfa antibiotics Hives and Rash 03/16/2013   Ace inhibitors Itching, Rash, and Cough 09/28/2015   Hydrocodone Itching 08/16/2021   Hydrocodone bit-homatrop mbr Itching 05/15/2017   Sulfamethoxazole Hives 09/28/2015    Family History  Problem Relation Age of Onset   Stroke Maternal Grandfather     Social History   Socioeconomic History   Marital status: Divorced    Spouse name: Not on file   Number of children: Not on file   Years of education: Not on file   Highest education level: Bachelor's degree (e.g., BA, AB, BS)  Occupational History   Not on file  Tobacco Use   Smoking status: Never   Smokeless tobacco: Never  Vaping Use   Vaping status: Never Used  Substance and Sexual Activity   Alcohol use: No   Drug use: No   Sexual activity: Not on file  Other Topics Concern   Not on file  Social History Narrative   Lives with daughter [name]   Right Handed   Drinks no caffeine   Retired engineer, 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[employer] [place]    Social Drivers of Health   Tobacco Use: Low Risk (05/18/2024)   Patient History    Smoking Tobacco Use: Never    Smokeless Tobacco Use: Never    Passive Exposure: Not on file  Financial Resource Strain: Medium Risk (05/18/2024)   Overall Financial Resource Strain (CARDIA)    Difficulty of Paying Living Expenses: Somewhat hard  Food Insecurity: Patient Declined (05/18/2024)   Epic    Worried About Programme Researcher, Broadcasting/film/video in the Last Year: Patient declined    Barista in the Last Year: Patient declined  Transportation Needs: No Transportation Needs (05/18/2024)   Epic    Lack of Transportation (Medical): No    Lack of Transportation (Non-Medical): No  Physical Activity: Inactive (05/18/2024)   Exercise Vital Sign    Days of Exercise per Week: 0 days    Minutes of Exercise per Session: Not on file  Stress: Stress Concern Present (05/18/2024)   [employer] of Occupational  Health - Occupational Stress Questionnaire    Feeling of Stress: Very much  Social Connections: Moderately Isolated (05/18/2024)   Social Connection and Isolation Panel    Frequency of Communication with Friends and Family: Twice a week    Frequency of Social Gatherings with Friends and Family: More than three times a week    Attends Religious Services: 1 to 4 times per year    Active Member of [employer] or Organizations: No    Attends Engineer, Structural: Not on file    Marital Status: Divorced  Intimate Partner Violence: Patient Unable To Answer (02/27/2024)   Epic    Fear of Current or Ex-Partner: Patient unable to answer    Emotionally Abused: Patient unable to answer    Physically Abused: Patient unable to answer    Sexually Abused: Patient unable to answer  Depression (PHQ2-9): Medium Risk (02/13/2023)   Depression (PHQ2-9)    PHQ-2 Score: 7  Alcohol Screen: Low Risk (05/18/2024)   Alcohol Screen    Last Alcohol Screening Score (AUDIT): 0  Housing: High Risk (05/18/2024)   Epic    Unable to Pay for Housing in the Last Year: Yes    Number of Times Moved in the Last Year: 0    Homeless in  the Last Year: No  Utilities: Patient Unable To Answer (02/27/2024)   Epic    Threatened with loss of utilities: Patient unable to answer  Health Literacy: Not on file    Review of Systems: ROS is O/W negative except as mentioned in HPI.  Physical Exam: Vital signs in last 24 hours: Temp:  [97.9 F (36.6 C)-98.6 F (37 C)] 98.4 F (36.9 C) (01/13 0835) Pulse Rate:  [78-88] 84 (01/13 0554) Resp:  [18-25] 18 (01/13 0554) BP: (59-119)/(43-99) 100/63 (01/13 0554) SpO2:  [100 %] 100 % (01/13 0554)   General:  Alert, Well-developed, well-nourished, pleasant and cooperative in NAD Head:  Normocephalic and atraumatic. Eyes:  Sclera clear, no icterus.  Conjunctiva pink. Ears:  Normal auditory acuity. Mouth:  No deformity or lesions.   Lungs:  Clear throughout to auscultation.  No  wheezes, crackles, or rhonchi.  Heart:  Regular rate and rhythm; no murmurs, clicks, rubs, or gallops. Abdomen:  Soft, non-distended.  BS present.  Non-tender. Rectal:  Heme positive stool in the ED. Msk:  Symmetrical without gross deformities.  Pulses:  Normal pulses noted. Extremities:  Without clubbing or edema. Neurologic:  Alert.  Severe expressive aphasia. Skin:  Intact without significant lesions or rashes.  Intake/Output from previous day: 01/12 0701 - 01/13 0700 In: 2093 [IV Piggyback:2093] Out: -   Lab Results: Recent Labs    06/15/24 1555 06/15/24 1606 06/16/24 0310  WBC  --  10.1 8.5  HGB 9.5* 9.5* 9.1*  HCT 28.0* 29.2* 27.0*  PLT  --  488* 396   BMET Recent Labs    06/15/24 1555 06/15/24 1603 06/16/24 0310  NA 138 135 135  K 2.5* 2.6* 3.5  CL 104 103 106  CO2  --  16* 17*  GLUCOSE 207* 214* 96  BUN 17 18 17   CREATININE 1.70* 1.75* 1.67*  CALCIUM   --  7.6* 7.8*   LFT Recent Labs    06/15/24 1603  PROT 5.9*  ALBUMIN 2.6*  AST 31  ALT 9  ALKPHOS 100  BILITOT 0.5   Studies/Results: DG Pelvis Portable Result Date: 06/15/2024 CLINICAL DATA:  Clemens, seizures EXAM: PORTABLE PELVIS 1-2 VIEWS COMPARISON:  07/13/2020 FINDINGS: Supine frontal view of the pelvis includes both hips. No acute displaced fracture. Alignment is anatomic. Mild symmetrical bilateral hip osteoarthritis. Sacroiliac joints are unremarkable. IMPRESSION: 1. No acute displaced fracture. 2. Mild symmetrical bilateral hip osteoarthritis. Electronically Signed   By: Ozell Daring M.D.   On: 06/15/2024 17:07   CT Cervical Spine Wo Contrast Result Date: 06/15/2024 CLINICAL DATA:  Clemens, hit head EXAM: CT CERVICAL SPINE WITHOUT CONTRAST TECHNIQUE: Multidetector CT imaging of the cervical spine was performed without intravenous contrast. Multiplanar CT image reconstructions were also generated. RADIATION DOSE REDUCTION: This exam was performed according to the departmental dose-optimization  program which includes automated exposure control, adjustment of the mA and/or kV according to patient size and/or use of iterative reconstruction technique. COMPARISON:  None Available. FINDINGS: Alignment: Alignment is anatomic. Skull base and vertebrae: No acute fracture. Benign cyst or hemangioma within the C5 vertebral body. No destructive bony abnormalities. Soft tissues and spinal canal: No prevertebral fluid or swelling. No visible canal hematoma. Prominent calcified atheromatous plaque at the carotid bifurcations. Disc levels: Hypertrophic changes at the C1-C2 interface. Disc spaces are relatively well preserved. Upper chest: Airway is patent.  Lung apices are clear. Other: Reconstructed images demonstrate no additional findings. IMPRESSION: 1. No acute cervical spine fracture. Electronically Signed   By: Ozell  Delores M.D.   On: 06/15/2024 17:06   CT CHEST ABDOMEN PELVIS WO CONTRAST Result Date: 06/15/2024 CLINICAL DATA:  Clemens, sepsis EXAM: CT CHEST, ABDOMEN AND PELVIS WITHOUT CONTRAST TECHNIQUE: Multidetector CT imaging of the chest, abdomen and pelvis was performed following the standard protocol without IV contrast. RADIATION DOSE REDUCTION: This exam was performed according to the departmental dose-optimization program which includes automated exposure control, adjustment of the mA and/or kV according to patient size and/or use of iterative reconstruction technique. COMPARISON:  05/19/2024 FINDINGS: CT CHEST FINDINGS Cardiovascular: Unenhanced imaging of the heart is unremarkable without pericardial effusion. Normal caliber of the thoracic aorta. Assessment of the vascular lumen cannot be performed without intravenous contrast. Mediastinum/Nodes: No enlarged mediastinal, hilar, or axillary lymph nodes. Thyroid  gland, trachea, and esophagus demonstrate no significant findings. Lungs/Pleura: No acute airspace disease, effusion, or pneumothorax. Central airways are patent. Musculoskeletal: No acute or  destructive bony abnormalities. Reconstructed images demonstrate no additional findings. CT ABDOMEN PELVIS FINDINGS Hepatobiliary: Unremarkable unenhanced appearance of the liver. Prior cholecystectomy. Pancreas: Unremarkable unenhanced appearance. Spleen: Unremarkable unenhanced appearance. Adrenals/Urinary Tract: The adrenals are stable. No urinary tract calculi or obstructive uropathy. Bladder is unremarkable. Stomach/Bowel: No bowel obstruction or ileus. Scattered colonic gas fluid levels are identified, which may reflect diarrhea or recent laxative/enema use. Prior gastric bypass surgery. Vascular/Lymphatic: No significant vascular findings are present. No enlarged abdominal or pelvic lymph nodes. Reproductive: Uterus and bilateral adnexa are unremarkable. Other: No free fluid or free intraperitoneal gas. No abdominal wall hernia. Musculoskeletal: No acute or destructive bony abnormalities. Reconstructed images demonstrate no additional findings. IMPRESSION: 1. No acute intrathoracic, intra-abdominal or intrapelvic trauma identified on this unenhanced exam. 2. Scattered colonic gas fluid levels, which may reflect diarrhea or recent laxative/enema use. No bowel obstruction. Electronically Signed   By: Ozell Delores M.D.   On: 06/15/2024 17:04   CT Head Wo Contrast Result Date: 06/15/2024 CLINICAL DATA:  Seizures, hit head EXAM: CT HEAD WITHOUT CONTRAST TECHNIQUE: Contiguous axial images were obtained from the base of the skull through the vertex without intravenous contrast. RADIATION DOSE REDUCTION: This exam was performed according to the departmental dose-optimization program which includes automated exposure control, adjustment of the mA and/or kV according to patient size and/or use of iterative reconstruction technique. COMPARISON:  02/25/2024 FINDINGS: Brain: Stable areas of encephalomalacia within the left frontal, left parietal, left temporal, and right parietal regions consistent with previous  infarcts. No signs of acute infarct or hemorrhage. Lateral ventricles and midline structures are stable. No acute extra-axial fluid collections. No mass effect. Vascular: No hyperdense vessel or unexpected calcification. Stable stent within the left MCA distribution. Skull: Normal. Negative for fracture or focal lesion. Sinuses/Orbits: No acute finding. Other: None. IMPRESSION: 1. No acute intracranial process. 2. Stable chronic ischemic changes as above. Electronically Signed   By: Ozell Delores M.D.   On: 06/15/2024 17:01   DG Chest Portable 1 View Result Date: 06/15/2024 CLINICAL DATA:  Clemens, seizures EXAM: PORTABLE CHEST 1 VIEW COMPARISON:  07/02/2020 FINDINGS: Single frontal view of the chest demonstrates an unremarkable cardiac silhouette. No airspace disease, effusion, or pneumothorax. No acute bony abnormalities. IMPRESSION: 1. No acute intrathoracic process. Electronically Signed   By: Ozell Delores M.D.   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  Suspected GI bleed with drop in hemoglobin at least a couple of grams, now down to 9.1 g this morning.  BUN is normal.  FOBT was positive and there was reportedly some dark/black-colored stools/diarrhea for the past couple of days at home.  She is status post Roux-en-Y gastric bypass so rule out anastomotic ulcer, etc.  History of seizures, on Keppra  and Topamax   History of stroke with expressive aphasia  History of paroxysmal atrial fibrillation, currently in sinus rhythm, on Eliquis  at home.  Last dose was 1/12 AM.  Chronic kidney disease stage IIIb  Type 2 diabetes mellitus   PLAN: - EGD today to rule out ulcer, etc. I discussed EGD with  the patient's daughter, [name], on the phone today.  She is in agreement to proceed. - Is on pantoprazole  40 mg IV twice daily, continue that for now. - Monitor/trend labs/hemoglobin.   Harlene BIRCH. Zehr  06/16/2024, 8:50 AM    Attending physician's note   I have taken history, reviewed the chart and examined the patient. I performed a substantive portion of this encounter, including complete performance of at least one of the key components, in conjunction with the APP. I agree with the Advanced Practitioner's note, impression and recommendations.   IDA with H+ stools (Hb 9 on and, baseline 10-11), ?dark stools. She is also s/p RYGB and with CKD. Neg CT AP without contrast Afib on eliquis  (last dose 1/12) H/O CVA with expressive aphasia H/O seizures on Keppra  and Topamax   Plan: - IV Protonix  - EGD today.  Consent obtained from the daughter - CT reviewed. - May benefit from IV iron - Not keen for colonoscopy.  Also, I doubt if she could tolerate prep.  Can always consider it as an outpt if she decides.  D/W daughter   Anselm Bring, MD [facility] [phone]    "

## 2024-06-16 NOTE — Op Note (Addendum)
 [facility] Patient Name: Courtney Burke Procedure Date : 06/16/2024 MRN: [id number] Attending MD: Lynnie Bring , MD, [id number] Date of Birth: 1956-04-22 CSN: [id number] Age: 69 Admit Type: Inpatient Procedure:                Upper GI endoscopy Indications:              Iron deficiency anemia, Heme positive stool. Hb 9                            (baseline 10-11) Providers:                Lynnie Bring, MD, Ozell Pouch, Curtistine Bishop, Technician Referring MD:              Medicines:                Monitored Anesthesia Care Complications:            No immediate complications. Estimated Blood Loss:     Estimated blood loss: none. Procedure:                Pre-Anesthesia Assessment:                           - Prior to the procedure, a History and Physical                            was performed, and patient medications and                            allergies were reviewed. The patient's tolerance of                            previous anesthesia was also reviewed. The risks                            and benefits of the procedure and the sedation                            options and risks were discussed with the patient.                            All questions were answered, and informed consent                            was obtained. Prior Anticoagulants: The patient has                            taken Eliquis  (apixaban ), last dose was 1 day prior                            to procedure. ASA Grade Assessment: III - A patient  with severe systemic disease. After reviewing the                            risks and benefits, the patient was deemed in                            satisfactory condition to undergo the procedure.                           After obtaining informed consent, the endoscope was                            passed under direct vision. Throughout the                            procedure,  the patient's blood pressure, pulse, and                            oxygen saturations were monitored continuously. The                            [id number]-H190 ([id number]) Olympus endoscope was introduced                            through the mouth, and advanced to the proximal                            jejunum. The upper GI endoscopy was accomplished                            without difficulty. The patient tolerated the                            procedure well. Scope In: Scope Out: Findings:      The examined esophagus was normal.      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 There was mild gastritis. Few       Biopsies were taken with a cold forceps for histology to r/o HP.      Three large non-bleeding cratered ulcers were found at and distal to the       gastrojejunal anastomosis. The largest lesion was deep cratered, 20 mm       in largest dimension with some food material. The other ulcers measured       15 mm each with some suture material as well. No active bleeding. Impression:               - Large marginal ulcers                           - Roux-en-Y anatomy Recommendation:           - Return patient to hospital ward for ongoing care.                           -  Full liquid diet.                           - Continue Protonix  40 mg IV twice daily x 24 hrs,                            then BID                           - Use sucralfate  suspension 1 gram PO QID for 10                            days.                           - No aspirin , ibuprofen, naproxen, or other                            non-steroidal anti-inflammatory drugs.                           - If Eliquis  is absolutely needed, resume 1/16.                           - Please note that these large ulcers are likely                            not amenable to endoscopic treatment if these were                            to bleed.                            - I do recommend repeating EGD in 12-18 weeks to                            ensure healing.                           - Hold off on colonoscopy                           - The findings and recommendations were discussed                            with the patient's family. Procedure Code(s):        --- Professional ---                           [id number], Esophagogastroduodenoscopy, flexible,                            transoral; with biopsy, single or multiple Diagnosis Code(s):        --- Professional ---  Z98.0, Intestinal bypass and anastomosis status                           K28.9, Gastrojejunal ulcer, unspecified as acute or                            chronic, without hemorrhage or perforation                           D50.9, Iron deficiency anemia, unspecified                           R19.5, Other fecal abnormalities CPT copyright 2022 [employer]. All rights reserved. The codes documented in this report are preliminary and upon coder review may  be revised to meet current compliance requirements. Lynnie Bring, MD 06/16/2024 1:21:37 PM This report has been signed electronically. Number of Addenda: 0

## 2024-06-16 NOTE — Transfer of Care (Signed)
 Immediate Anesthesia Transfer of Care Note  Patient: Courtney Burke  Procedure(s) Performed: EGD (ESOPHAGOGASTRODUODENOSCOPY) BIOPSY, SKIN, SUBCUTANEOUS TISSUE, OR MUCOUS MEMBRANE  Patient Location: Endoscopy Unit  Anesthesia Type:MAC  Level of Consciousness: awake  Airway & Oxygen Therapy: Patient Spontanous Breathing and Patient connected to nasal cannula oxygen  Post-op Assessment: Report given to RN and Post -op Vital signs reviewed and stable  Post vital signs: Reviewed and stable  Last Vitals:  Vitals Value Taken Time  BP 97/64 06/16/24 13:11  Temp    Pulse 131 06/16/24 13:13  Resp 17 06/16/24 13:13  SpO2 98 % 06/16/24 13:13  Vitals shown include unfiled device data.  Last Pain:  Vitals:   06/16/24 1311  TempSrc:   PainSc: Asleep         Complications: No notable events documented.

## 2024-06-16 NOTE — Hospital Course (Signed)
 Courtney Burke is a 69 y.o. female with PMH CVA with residual expressive aphasia, seizure disorder, HTN, HLD, gastric bypass, DM II, mood disorder, GERD, PAF, asthma, CKD 3B who presented for evaluation after an episode of syncope and seizure. Per daughter, it was reported that the patient had been weak and fatigued for a few days.  Prior to admission she was getting up for doctors appointment and was reported to start banging her head against the wall then collapsed to the floor.  No loss of consciousness noted but became stiff with shaking and eyes rolling back with associated bowel and urinary incontinence. Also was reported she was having dark watery stools for couple days.  Last seizure approximately 1 month ago and has been compliant on medications.  GI was also consulted on admission.  Hgb 9.5 g/dL on admission and previously around 11 to 12 g/dL.  FOBT positive.   Assessment/Plan  Presyncope  - Patient presented after a pre-syncope episode at home followed by a seizure episode - Patient found to be hypotensive by EMS, BP remains soft on admission - etiology potentially hypotension related vs trauma from hitting head causing breakthru seizure (need to clarify story on admission still)  GIB - Pt presented with a few days of dark/black diarrhea with associated generalized weakness/fatigue - Hgb of 9.5 on admission from 13.6 about 4 weeks ago, baseline seems to be around 11 - FOBT positive - appreciate GI evaluation - plan is for EGD today; given history of gastric bypass as well - continue protonix   - holding asa and eliquis  for now  Seizure - Patient with a history of seizure presenting after a witnessed seizure episode at home, no further episodes since admission - Reports compliance with her Keppra , seizure possibly in the setting of hypotension/GI bleed - Continue home Keppra  and Topamax   - Check Keppra  levels - Seizure precautions  DMII with hyperglycemia - Last A1c was 4.7%,  appropriately not on any diabetic medications  - Repeat A1c 5.9% - Glucose elevated to 214 on admission - Continue IV hydration and start Q4H SSI with CBG monitoring   CKD 3B - Creatinine of 1.75 stable compared to recent baseline of 1.8-2.0 - Continue IV hydration, trend renal function   PAF - Remains in normal sinus, rate controlled - Hold Eliquis  in the setting of GI bleed   Hypophosphatemia Hypomagnesemia Hypokalemia  Replete as needed   HLD - Continue atorvastatin    Hx of CVA - Has expressive aphasia from repeated strokes - hold aspirin  - continue statin    GERD - Continue IV PPI   Mood disorder - Continue duloxetine  and amitriptyline 

## 2024-06-16 NOTE — Anesthesia Postprocedure Evaluation (Signed)
"   Anesthesia Post Note  Patient: Titilayo Hagans  Procedure(s) Performed: EGD (ESOPHAGOGASTRODUODENOSCOPY) BIOPSY, SKIN, SUBCUTANEOUS TISSUE, OR MUCOUS MEMBRANE     Patient location during evaluation: PACU Anesthesia Type: MAC Level of consciousness: awake and alert Pain management: pain level controlled Vital Signs Assessment: post-procedure vital signs reviewed and stable Respiratory status: spontaneous breathing, nonlabored ventilation, respiratory function stable and patient connected to nasal cannula oxygen Cardiovascular status: stable and blood pressure returned to baseline Postop Assessment: no apparent nausea or vomiting Anesthetic complications: no   No notable events documented.  Last Vitals:  Vitals:   06/16/24 1430 06/16/24 1500  BP: 113/67 110/74  Pulse: (!) 115 (!) 117  Resp: 19 (!) 23  Temp:    SpO2: 96% 97%    Last Pain:  Vitals:   06/16/24 1500  TempSrc:   PainSc: Asleep                 Franky JONETTA Bald      "

## 2024-06-16 NOTE — Anesthesia Preprocedure Evaluation (Addendum)
"                                    Anesthesia Evaluation  Patient identified by MRN, date of birth, ID band Patient awake and Patient confused    Reviewed: Allergy & Precautions, NPO status , Patient's Chart, lab work & pertinent test results  Airway Mallampati: II  TM Distance: >3 FB Neck ROM: Full    Dental  (+) Teeth Intact, Dental Advisory Given   Pulmonary asthma    breath sounds clear to auscultation       Cardiovascular hypertension, + pacemaker  Rhythm:Regular Rate:Tachycardia  Echo (2022):   1. Left ventricular ejection fraction, by estimation, is 55 to 60%. Left  ventricular ejection fraction by 3D volume is 58 %. The left ventricle has  normal function. The left ventricle has no regional wall motion  abnormalities. There is moderate  asymmetric left ventricular hypertrophy of the basal-septal segment. Left  ventricular diastolic parameters are consistent with Grade I diastolic  dysfunction (impaired relaxation).   2. Right ventricular systolic function is normal. The right ventricular  size is normal. There is mildly elevated pulmonary artery systolic  pressure. The estimated right ventricular systolic pressure is 36.1 mmHg.   3. The mitral valve is degenerative. Trivial mitral valve regurgitation.  No evidence of mitral stenosis.   4. Tricuspid valve regurgitation is mild to moderate.   5. The aortic valve is tricuspid. There is mild calcification of the  aortic valve. There is mild thickening of the aortic valve. Aortic valve  regurgitation is not visualized. Aortic valve sclerosis/calcification is  present, without any evidence of  aortic stenosis.   6. The inferior vena cava is normal in size with <50% respiratory  variability, suggesting right atrial pressure of 8 mmHg.     Neuro/Psych  Headaches, Seizures -,  PSYCHIATRIC DISORDERS  Depression    CVA    GI/Hepatic Neg liver ROS, PUD,GERD  Medicated,,  Endo/Other  diabetes, Type 2     Renal/GU Renal disease     Musculoskeletal negative musculoskeletal ROS (+)    Abdominal   Peds  Hematology  (+) Blood dyscrasia, anemia   Anesthesia Other Findings   Reproductive/Obstetrics                              Anesthesia Physical Anesthesia Plan  ASA: 3  Anesthesia Plan: MAC   Post-op Pain Management:    Induction: Intravenous  PONV Risk Score and Plan: 0 and Propofol  infusion  Airway Management Planned: Natural Airway and Nasal Cannula  Additional Equipment: None  Intra-op Plan:   Post-operative Plan:   Informed Consent: I have reviewed the patients History and Physical, chart, labs and discussed the procedure including the risks, benefits and alternatives for the proposed anesthesia with the patient or authorized representative who has indicated his/her understanding and acceptance.     Consent reviewed with POA  Plan Discussed with: CRNA  Anesthesia Plan Comments:          Anesthesia Quick Evaluation  "

## 2024-06-16 NOTE — Progress Notes (Addendum)
 Pt presented to endoscopy today for EGD with Dr Charlanne. Post-operatively, expiratory wheezes noted bilaterally in the upper lung fields. [facility] Hollis notified. VO for 3 mL duoneb x1. Medication given as ordered.   Ozell VEAR Pouch, RN 06/16/2024 1:34 PM  Addendum 1534: Improvement in wheezing post duoneb. Family updated.  Addendum 1633: Pt transferred to inpt unit. Family member updated.  Ozell VEAR Pouch, RN 06/16/2024 4:33 PM

## 2024-06-17 ENCOUNTER — Other Ambulatory Visit (HOSPITAL_COMMUNITY): Payer: Self-pay

## 2024-06-17 ENCOUNTER — Encounter (HOSPITAL_COMMUNITY): Payer: Self-pay | Admitting: Student

## 2024-06-17 ENCOUNTER — Inpatient Hospital Stay (HOSPITAL_COMMUNITY)

## 2024-06-17 DIAGNOSIS — R55 Syncope and collapse: Secondary | ICD-10-CM | POA: Diagnosis not present

## 2024-06-17 DIAGNOSIS — R569 Unspecified convulsions: Secondary | ICD-10-CM

## 2024-06-17 DIAGNOSIS — I951 Orthostatic hypotension: Secondary | ICD-10-CM | POA: Diagnosis not present

## 2024-06-17 LAB — CBC WITH DIFFERENTIAL/PLATELET
Abs Immature Granulocytes: 0.09 K/uL — ABNORMAL HIGH (ref 0.00–0.07)
Abs Immature Granulocytes: 0.19 K/uL — ABNORMAL HIGH (ref 0.00–0.07)
Basophils Absolute: 0 K/uL (ref 0.0–0.1)
Basophils Absolute: 0 K/uL (ref 0.0–0.1)
Basophils Relative: 0 %
Basophils Relative: 0 %
Eosinophils Absolute: 0.4 K/uL (ref 0.0–0.5)
Eosinophils Absolute: 0.4 K/uL (ref 0.0–0.5)
Eosinophils Relative: 5 %
Eosinophils Relative: 5 %
HCT: 23.4 % — ABNORMAL LOW (ref 36.0–46.0)
HCT: 28.3 % — ABNORMAL LOW (ref 36.0–46.0)
Hemoglobin: 8 g/dL — ABNORMAL LOW (ref 12.0–15.0)
Hemoglobin: 9.7 g/dL — ABNORMAL LOW (ref 12.0–15.0)
Immature Granulocytes: 1 %
Immature Granulocytes: 3 %
Lymphocytes Relative: 23 %
Lymphocytes Relative: 18 %
Lymphs Abs: 1.5 K/uL (ref 0.7–4.0)
Lymphs Abs: 1.4 K/uL (ref 0.7–4.0)
MCH: 28 pg (ref 26.0–34.0)
MCH: 27.8 pg (ref 26.0–34.0)
MCHC: 34.2 g/dL (ref 30.0–36.0)
MCHC: 34.3 g/dL (ref 30.0–36.0)
MCV: 81.8 fL (ref 80.0–100.0)
MCV: 81.1 fL (ref 80.0–100.0)
Monocytes Absolute: 0.3 K/uL (ref 0.1–1.0)
Monocytes Absolute: 0.4 K/uL (ref 0.1–1.0)
Monocytes Relative: 5 %
Monocytes Relative: 6 %
Neutro Abs: 4.3 K/uL (ref 1.7–7.7)
Neutro Abs: 5.3 K/uL (ref 1.7–7.7)
Neutrophils Relative %: 66 %
Neutrophils Relative %: 68 %
Platelets: 423 K/uL — ABNORMAL HIGH (ref 150–400)
Platelets: 433 K/uL — ABNORMAL HIGH (ref 150–400)
RBC: 2.86 MIL/uL — ABNORMAL LOW (ref 3.87–5.11)
RBC: 3.49 MIL/uL — ABNORMAL LOW (ref 3.87–5.11)
RDW: 14.6 % (ref 11.5–15.5)
RDW: 14.6 % (ref 11.5–15.5)
WBC: 6.6 K/uL (ref 4.0–10.5)
WBC: 7.7 K/uL (ref 4.0–10.5)
nRBC: 0 % (ref 0.0–0.2)
nRBC: 0 % (ref 0.0–0.2)

## 2024-06-17 LAB — BASIC METABOLIC PANEL WITH GFR
Anion gap: 11 (ref 5–15)
BUN: 13 mg/dL (ref 8–23)
CO2: 18 mmol/L — ABNORMAL LOW (ref 22–32)
Calcium: 8 mg/dL — ABNORMAL LOW (ref 8.9–10.3)
Chloride: 110 mmol/L (ref 98–111)
Creatinine, Ser: 1.5 mg/dL — ABNORMAL HIGH (ref 0.44–1.00)
GFR, Estimated: 37 mL/min — ABNORMAL LOW
Glucose, Bld: 82 mg/dL (ref 70–99)
Potassium: 3.2 mmol/L — ABNORMAL LOW (ref 3.5–5.1)
Sodium: 138 mmol/L (ref 135–145)

## 2024-06-17 LAB — GLUCOSE, CAPILLARY
Glucose-Capillary: 80 mg/dL (ref 70–99)
Glucose-Capillary: 82 mg/dL (ref 70–99)
Glucose-Capillary: 84 mg/dL (ref 70–99)
Glucose-Capillary: 84 mg/dL (ref 70–99)
Glucose-Capillary: 85 mg/dL (ref 70–99)
Glucose-Capillary: 86 mg/dL (ref 70–99)

## 2024-06-17 LAB — MAGNESIUM: Magnesium: 1.9 mg/dL (ref 1.7–2.4)

## 2024-06-17 LAB — LEVETIRACETAM LEVEL: Levetiracetam Lvl: 66.9 ug/mL — ABNORMAL HIGH (ref 10.0–40.0)

## 2024-06-17 LAB — PREPARE RBC (CROSSMATCH)

## 2024-06-17 LAB — SURGICAL PATHOLOGY

## 2024-06-17 MED ORDER — TAMSULOSIN HCL 0.4 MG PO CAPS
0.4000 mg | ORAL_CAPSULE | Freq: Every day | ORAL | Status: DC
  Administered 2024-06-17 – 2024-06-18 (×2): 0.4 mg via ORAL
  Filled 2024-06-17 (×2): qty 1

## 2024-06-17 MED ORDER — SODIUM CHLORIDE 0.9% IV SOLUTION: Freq: Once | INTRAVENOUS | Status: AC

## 2024-06-17 MED ORDER — GUAIFENESIN-DM 100-10 MG/5ML PO SYRP
5.0000 mL | ORAL_SOLUTION | ORAL | Status: DC | PRN
  Administered 2024-06-17: 5 mL via ORAL
  Filled 2024-06-17: qty 5

## 2024-06-17 MED ORDER — POTASSIUM CHLORIDE CRYS ER 20 MEQ PO TBCR
40.0000 meq | EXTENDED_RELEASE_TABLET | Freq: Once | ORAL | Status: AC
  Administered 2024-06-17: 40 meq via ORAL
  Filled 2024-06-17: qty 2

## 2024-06-17 MED ORDER — POTASSIUM CHLORIDE 2 MEQ/ML IV SOLN
INTRAVENOUS | Status: DC
  Filled 2024-06-17 (×2): qty 1000

## 2024-06-17 NOTE — Plan of Care (Signed)

## 2024-06-17 NOTE — Progress Notes (Signed)
 EEG complete - results pending

## 2024-06-17 NOTE — Evaluation (Signed)
 Clinical/Bedside Swallow Evaluation Patient Details  Name: Courtney Burke MRN: [id number] Date of Birth: 1955/11/26  Today's Date: 06/17/2024 Time: SLP Start Time (ACUTE ONLY): 1445 SLP Stop Time (ACUTE ONLY): 1500 SLP Time Calculation (min) (ACUTE ONLY): 15 min  Past Medical History:  Past Medical History:  Diagnosis Date   Acute ischemic left MCA stroke (HCC) 06/30/2020   Acute ischemic stroke (HCC)    Acute stroke due to occlusion of left middle cerebral artery (HCC) 06/30/2020   Age-related nuclear cataract of both eyes 09/28/2015   Arthralgia of multiple joints 09/28/2015   Asthma    Borderline diabetes    Cerebrovascular accident (CVA) (HCC)    Chronic bilateral thoracic back pain 06/05/2018   Chronic renal insufficiency, stage 1 08/11/2014   Diabetes mellitus type 2 in obese    Dyslipidemia    Dysphagia, post-stroke    Erosive gastritis 07/20/2019   Formatting of this note might be different from the original. On EGD 07/2019   Esophagitis 07/20/2019   Formatting of this note might be different from the original. Added automatically from request for surgery [id number]  Formatting of this note might be different from the original. On EGD 07/2019   Essential hypertension 09/28/2015   Essential thrombocytosis (HCC) 09/28/2015   Gallstones 08/18/2019   Formatting of this note might be different from the original. 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   Morbid obesity (HCC) 05/21/2013   Seizures (HCC)    Stage 3b chronic kidney disease (HCC)    Status post gastric bypass for obesity 09/28/2015   Past Surgical History:  Past Surgical History:  Procedure Laterality  Date   ANKLE SURGERY     BIOPSY OF SKIN SUBCUTANEOUS TISSUE AND/OR MUCOUS MEMBRANE  06/16/2024   Procedure: BIOPSY, SKIN, SUBCUTANEOUS TISSUE, OR MUCOUS MEMBRANE;  Surgeon: Charlanne Groom, MD;  Location: [facility] ENDOSCOPY;  Service: Gastroenterology;;   CARPAL TUNNEL RELEASE     carpel tunnel     ESOPHAGOGASTRODUODENOSCOPY N/A 06/16/2024   Procedure: EGD (ESOPHAGOGASTRODUODENOSCOPY);  Surgeon: Charlanne Groom, MD;  Location: [facility] ENDOSCOPY;  Service: Gastroenterology;  Laterality: N/A;   ESOPHAGOGASTRODUODENOSCOPY (EGD) WITH PROPOFOL  N/A 11/14/2021   Procedure: ESOPHAGOGASTRODUODENOSCOPY (EGD) WITH PROPOFOL ;  Surgeon: Avram Lupita BRAVO, MD;  Location: [facility] ENDOSCOPY;  Service: Gastroenterology;  Laterality: N/A;   IR ANGIO INTRA EXTRACRAN SEL COM CAROTID INNOMINATE BILAT MOD SED  01/17/2021   IR ANGIO VERTEBRAL SEL SUBCLAVIAN INNOMINATE UNI R MOD SED  01/17/2021   IR CT HEAD LTD  06/30/2020   IR INTRA CRAN STENT  06/30/2020   IR PERCUTANEOUS ART THROMBECTOMY/INFUSION INTRACRANIAL INC DIAG ANGIO  06/30/2020   IR RADIOLOGIST EVAL & MGMT  08/26/2020   IR US  GUIDE VASC ACCESS RIGHT  01/17/2021   RADIOLOGY WITH ANESTHESIA N/A 06/30/2020   Procedure: RADIOLOGY WITH ANESTHESIA;  Surgeon: Radiologist, Medication, MD;  Location: [facility] OR;  Service: Radiology;  Laterality: N/A;   HPI:  Courtney Burke is a 69 y.o. female who presented from home on 06/15/24 after family observed patient to fall and hit her head on the wall with seizure like activity. EGD on 1/13 reported three large non-bleeding ulcers found in esophagus with the largest also with some food material. CT chest reported: no acute airspace disease.  GI recommended full liquids diet but patient made NPO by attending MD awaiting SLP swallow evaluation. PMH: prior CVA with expressive aphasia, seizures, DM-2, mood disorder, GERD, asthma.    Assessment / Plan / Recommendation  Clinical Impression  Plan: Patient is safe to advance to puree solids, thin liquids when cleared by  GI without SLP intervention but recommend SLP assess with solids prior to advancing.   Patient presents with a questionable oral-phase dysphagia as per this bedside swallow evaluation. She required assistance with self-feeding secondary to either deficit in initiating actions versus apraxia from prior CVA. Swallow initiation appeared timely with puree solids and thin liquids and no overt s/s aspiration observed. Suspect that patient will progress well and not require intervention s/p discharge from hospital. SLP Visit Diagnosis: Dysphagia, unspecified (R13.10)    Aspiration Risk  Mild aspiration risk    Diet Recommendation Thin liquid;Other (Comment) (could advance as high as puree solids pending GI recs for advancement)    Liquid Administration via: Cup;Straw Medication Administration: Other (Comment) (as tolerated) Supervision: Full supervision/cueing for compensatory strategies;Staff to assist with self feeding Compensations: Slow rate;Small sips/bites Postural Changes: Seated upright at 90 degrees;Remain upright for at least 30 minutes after po intake    Other Recommendations Oral Care Recommendations: Oral care BID     Swallow Evaluation Recommendations     Assistance Recommended at Discharge    Functional Status Assessment Patient has had a recent decline in their functional status and demonstrates the ability to make significant improvements in function in a reasonable and predictable amount of time.  Frequency and Duration min 1 x/week  1 week       Prognosis Prognosis for improved oropharyngeal function: Good      Swallow Study   General Date of Onset: 06/17/24 HPI: Courtney Burke is a 69 y.o. female who presented from home on 06/15/24 after family observed patient to fall and hit her head on the wall with seizure like activity. EGD on 1/13 reported three large non-bleeding ulcers found in esophagus with the largest also with some food material. CT chest reported: no acute  airspace disease. GI recommended full liquids diet but patient made NPO by attending MD awaiting SLP swallow evaluation. PMH: prior CVA with expressive aphasia, seizures, DM-2, mood disorder, GERD, asthma. Type of Study: Bedside Swallow Evaluation Previous Swallow Assessment: BSE in September of 2025 Diet Prior to this Study: NPO Temperature Spikes Noted: No Respiratory Status: Room air History of Recent Intubation: No Behavior/Cognition: Alert;Cooperative;Pleasant mood Oral Cavity Assessment: Within Functional Limits Oral Care Completed by SLP: No Oral Cavity - Dentition: Adequate natural dentition Vision: Functional for self-feeding Self-Feeding Abilities: Needs set up;Other (Comment) (needs assist but seems to be related to ?apraxia) Patient Positioning: Upright in bed Baseline Vocal Quality: Low vocal intensity Volitional Cough: Cognitively unable to elicit Volitional Swallow: Unable to elicit    Oral/Motor/Sensory Function Overall Oral Motor/Sensory Function: Other (comment) (appears WFL)   Ice Chips     Thin Liquid Thin Liquid: Within functional limits Presentation: Straw    Nectar Thick     Honey Thick     Puree Puree: Within functional limits Presentation: Spoon   Solid     Solid: Not tested Other Comments: not tested secondary to patient on full liquids per GI      Norleen IVAR Blase, MA, CCC-SLP Speech Therapy  06/17/2024,3:57 PM

## 2024-06-17 NOTE — TOC Initial Note (Signed)
 Transition of Care [facility]) - Initial/Assessment Note    Patient Details  Name: Courtney Burke MRN: [id number] Date of Birth: 1955/11/25  Transition of Care [facility]) CM/SW Contact:    Inocente GORMAN Kindle, LCSW Phone Number: 06/17/2024, 3:39 PM  Clinical Narrative:                 CSW received consult for possible SNF at time of discharge. CSW spoke with patient's daughter as patient is disoriented. Patient's daughter reported that the patient would be very upset to have to go to a SNF even for a week. She stated she would like home health services. Patient reports preference for [facility] since she has used them before and liked their care. CSW discussed equipment needs and she requested a BSC. Patient already has a walker that she uses as needed, a wheelchair, and shower seat. She asked about a purewick and CSW explained that is out of pocket and provided resources to contact. CSW confirmed PCP and address. Will update RNCM.   Expected Discharge Plan: Home w Home Health Services Barriers to Discharge: Continued Medical Work up   Patient Goals and CMS Choice Patient states their goals for this hospitalization and ongoing recovery are:: Return home CMS Medicare.gov Compare Post Acute Care list provided to:: Patient Represenative (must comment) Choice offered to / list presented to : Adult Children [place] ownership interest in [facility].provided to:: Adult Children    Expected Discharge Plan and Services In-house Referral: Clinical Social Work Discharge Planning Services: CM Consult Post Acute Care Choice: Durable Medical Equipment, Home Health Living arrangements for the past 2 months: Single Family Home                                      Prior Living Arrangements/Services Living arrangements for the past 2 months: Single Family Home Lives with:: Adult Children (Daughter) Patient language and need for interpreter reviewed:: Yes Do you feel safe going  back to the place where you live?: Yes      Need for Family Participation in Patient Care: Yes (Comment) Care giver support system in place?: Yes (comment) Current home services: DME (wheelchair/ walker/ shower seat) Criminal Activity/Legal Involvement Pertinent to Current Situation/Hospitalization: No - Comment as needed  Activities of Daily Living   ADL Screening (condition at time of admission) Independently performs ADLs?: No Does the patient have a NEW difficulty with bathing/dressing/toileting/self-feeding that is expected to last >3 days?: No Does the patient have a NEW difficulty with getting in/out of bed, walking, or climbing stairs that is expected to last >3 days?: No Does the patient have a NEW difficulty with communication that is expected to last >3 days?: No Is the patient deaf or have difficulty hearing?: No Does the patient have difficulty seeing, even when wearing glasses/contacts?: No Does the patient have difficulty concentrating, remembering, or making decisions?: Yes  Permission Sought/Granted Permission sought to share information with : Facility Medical Sales Representative, Family Supports Permission granted to share information with : Yes, Verbal Permission Granted  Share Information with NAME: Tinslee, Klare -Daughter  [phone]  Permission granted to share info w AGENCY: HH        Emotional Assessment Appearance:: Appears stated age Attitude/Demeanor/Rapport: Unable to Assess Affect (typically observed): Unable to Assess Orientation: : Oriented to Self Alcohol / Substance Use: Not Applicable Psych Involvement: No (comment)  Admission diagnosis:  Syncope and collapse [  R55] Seizure disorder (HCC) [G40.909] Hyperlipidemia LDL goal <70 [E78.5] Breakthrough seizure (HCC) [G40.919] Migraine without status migrainosus, not intractable, unspecified migraine type [G43.909] Hypotension, unspecified hypotension type [I95.9] Gastrointestinal  hemorrhage, unspecified gastrointestinal hemorrhage type [K92.2] Syncope, unspecified syncope type [R55] Seizure, late effect of stroke (HCC) [P30.601, R56.9] Patient Active Problem List   Diagnosis Date Noted   GIB (gastrointestinal bleeding) 06/16/2024   Hypotension 06/16/2024   Hypophosphatemia 06/16/2024   Type 2 diabetes mellitus with hyperglycemia, without long-term current use of insulin  (HCC) 06/16/2024   Orthostatic hypotension 06/15/2024   Seizure (HCC) 02/25/2024   Trigger middle finger of left hand 03/28/2022   Aphasia following cerebrovascular accident (CVA) 03/28/2022   Hypokalemia 11/12/2021   Hypomagnesemia 11/12/2021   Normocytic anemia 11/10/2021   Hyponatremia 11/09/2021   Metabolic acidosis 11/09/2021   Intractable nausea and vomiting 11/08/2021   Paroxysmal A-fib (HCC) 11/08/2021   History of CVA (cerebrovascular accident) 07/24/2021   Syncope 05/16/2021   Breast lump 05/16/2021   Seizure disorder (HCC) 05/16/2021   CVA (cerebral vascular accident) (HCC) 05/11/2021   Acute cerebral infarction (HCC) 05/10/2021   Dyslipidemia 05/09/2021   Type 2 diabetes mellitus in patient with obesity (HCC) 05/09/2021   Cerebral infarction due to unspecified occlusion or stenosis of left middle cerebral artery (HCC) 06/30/2020   Gallstones 08/18/2019   Esophagitis 07/20/2019   Erosive gastritis 07/20/2019   Nausea vomiting and diarrhea 07/16/2019   AKI (acute kidney injury) 07/15/2019   Chronic bilateral thoracic back pain 06/05/2018   Large breasts 06/05/2018   Seasonal allergic rhinitis due to pollen 05/09/2017   Microcalcifications of the breast 07/02/2016   Mild intermittent asthma without complication 05/08/2016   Severe single current episode of major depressive disorder, without psychotic features (HCC) 05/08/2016   Vitamin D  deficiency 05/08/2016   Encounter for health maintenance examination 05/08/2016   Persistent headaches 02/16/2016   Arthralgia of multiple  joints 09/28/2015   Age-related nuclear cataract of both eyes 09/28/2015   Asthma 09/28/2015   Essential hypertension 09/28/2015   Essential thrombocytosis (HCC) 09/28/2015   Gastroesophageal reflux disease without esophagitis 09/28/2015   Keratoconjunctivitis sicca of both eyes not specified as Sjogren's 09/28/2015   Status post gastric bypass for obesity 09/28/2015   CKD stage 3b, GFR 30-44 ml/min (HCC) 09/28/2015   Esophageal dysphagia 09/28/2015   Hot flashes 09/28/2015   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   IFG (impaired fasting glucose) 09/28/2015   Menopausal disorder 09/28/2015   Morbid obesity with BMI of 40.0-44.9, adult (HCC) 09/28/2015   Non-smoker 09/28/2015   Pelvic pain in female 09/28/2015   Presbyopia of both eyes 09/28/2015   Snoring 09/28/2015   Tinea corporis 09/28/2015   Osteopenia 07/15/2015   Benign hypertension with CKD (chronic kidney disease) stage III (HCC) 08/11/2014   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   Hyperlipidemia 03/16/2013   PCP:  [name], MD Pharmacy:   [id number] - [place], [place] - [address] [place] [address] Phone: [phone] Fax: [phone]     Social Drivers of Health (SDOH) Social History: SDOH Screenings   Food Insecurity: Patient Declined (06/16/2024)  Housing: Patient Unable To Answer (06/16/2024)  Recent Concern: Housing - High Risk (05/18/2024)  Transportation Needs: Patient Unable To Answer (06/16/2024)  Utilities: Patient Unable To Answer (06/16/2024)  Alcohol Screen: Low Risk (05/18/2024)  Depression (PHQ2-9): Medium Risk (02/13/2023)  Financial Resource Strain: Medium Risk (05/18/2024)  Physical Activity: Inactive (05/18/2024)  Social Connections:  Unknown (06/16/2024)  Recent Concern: Social Connections - Moderately Isolated (05/18/2024)  Stress: Stress Concern Present (05/18/2024)  Tobacco Use: Low Risk (05/18/2024)   SDOH  Interventions:     Readmission Risk Interventions    11/15/2021    1:04 PM  Readmission Risk Prevention Plan  Transportation Screening Complete  PCP or Specialist Appt within 3-5 Days Complete  HRI or Home Care Consult Complete  Social Work Consult for Recovery Care Planning/Counseling Complete  Palliative Care Screening Not Applicable  Medication Review Oceanographer) Complete

## 2024-06-17 NOTE — Evaluation (Signed)
 Physical Therapy Evaluation Patient Details Name: Courtney Burke MRN: [id number] DOB: Jul 15, 1955 Today's Date: 06/17/2024  History of Present Illness  Patient is a 69 yo female who presented to [facility] on 1/12 after family observed pt fall and hit her head on the wall with seizure like activity. Pt with history of seizures. Reports R arm pain. 3 stomach ulcers found. 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   Clinical Impression  Pt baseline mobility is unknown due to pt having expressive aphasia and no family members being present at this time. Pt requires min-modA for bed mobility due to difficulty sequencing movements to position self EOB. Pt requires modA to complete STS and stand pivot with modA and heavy cues due continued difficulty sequencing/initiating LE movement. Pt with forward posture throughout, hand held assist and trunk support provided for balance while transferring. Pt able to lean forward with hand held assist during peri care. Pt fatigues quickly and is noted to be falling asleep while seated EOB at end of session. ModA provided to return to bed and position comfortably. Pt would benefit from continued PT services focused on bed mobility, strength, transfers, and progress to ambulation as appropriate. Will continue to follow up regarding home set up/PLOF.        If plan is discharge home, recommend the following: A lot of help with walking and/or transfers;A lot of help with bathing/dressing/bathroom;Assistance with cooking/housework;Direct supervision/assist for medications management;Direct supervision/assist for financial management;Assist for transportation;Help with stairs or ramp for entrance;Supervision due to cognitive status   Can travel by private vehicle   No    Equipment Recommendations Rolling walker (2 wheels);BSC/3in1;Wheelchair  (measurements PT);Wheelchair cushion (measurements PT)  Recommendations for Other Services       Functional Status Assessment Patient has had a recent decline in their functional status and demonstrates the ability to make significant improvements in function in a reasonable and predictable amount of time.     Precautions / Restrictions Precautions Precautions: Fall;Other (comment) Recall of Precautions/Restrictions: Impaired Precaution/Restrictions Comments: Seizure Restrictions Weight Bearing Restrictions Per Provider Order: No      Mobility  Bed Mobility Overal bed mobility: Needs Assistance Bed Mobility: Supine to Sit, Sit to Supine     Supine to sit: Min assist, Mod assist Sit to supine: Mod assist   General bed mobility comments: Difficulty sequencing movements needed for bed mobility. Assist needed to scoot forward EOB. Does well managing trunk and LE with increased time. LE assist to return to bed.    Transfers Overall transfer level: Needs assistance Equipment used: 1 person hand held assist Transfers: Sit to/from Stand, Bed to chair/wheelchair/BSC Sit to Stand: Min assist   Step pivot transfers: Mod assist       General transfer comment: Pt demonstrates crouched posture upon stance. Hand held assist x1 +trunk support provided to pivot to commode. Does better with transfer back to bed. Difficulty sequencing steps over to commode.    Ambulation/Gait               General Gait Details: Pt fatigues after toileting, limiting command following, not safe to ambulate at this time.  Stairs            Wheelchair Mobility     Tilt Bed    Modified Rankin (Stroke Patients Only)       Balance Overall balance assessment: Needs assistance Sitting-balance support: No upper  extremity supported, Feet supported Sitting balance-Leahy Scale: Fair Sitting balance - Comments: CGA for seated balance, does okay unsupported while on commode.   Standing balance  support: Single extremity supported, Bilateral upper extremity supported, During functional activity Standing balance-Leahy Scale: Poor Standing balance comment: Pt in crouched posture, leans forward with support for peri care after toileting. Would benefit from walker if able to manage safely.                             Pertinent Vitals/Pain Pain Assessment Pain Assessment: PAINAD Breathing: normal Negative Vocalization: none Facial Expression: smiling or inexpressive Body Language: relaxed Consolability: no need to console PAINAD Score: 0 Pain Intervention(s): Limited activity within patient's tolerance, Repositioned, Monitored during session    Home Living Family/patient expects to be discharged to:: Private residence Living Arrangements: Children;Other relatives (Per chart review, daughter lives with patient)                 Additional Comments: Pt with expressive aphasia due to prior CVA. Unable to appropriately answer home set up and PLOF questions. Also unknown from prior admission. Will follow up with family as able to obtain home set up and PLOF.    Prior Function Prior Level of Function : Needs assist             Mobility Comments: Anticipate pt required assist with mobility ADLs Comments: Anticipate pt required assist with ADLs and IADLs     Extremity/Trunk Assessment   Upper Extremity Assessment Upper Extremity Assessment: Defer to OT evaluation    Lower Extremity Assessment Lower Extremity Assessment: RLE deficits/detail;LLE deficits/detail RLE Deficits / Details: Difficulty following cues to resist MMT. Performs AROM within functional range. RLE Sensation: WNL LLE Deficits / Details: Appears to have L>R weakness during AROM. Difficulty following cues to resist MMT LLE Sensation: WNL    Cervical / Trunk Assessment Cervical / Trunk Assessment: Kyphotic  Communication   Communication Communication: Impaired Factors Affecting  Communication: Difficulty expressing self    Cognition Arousal: Alert Behavior During Therapy: WFL for tasks assessed/performed, Flat affect   PT - Cognitive impairments: Initiation, Sequencing, Problem solving, Safety/Judgement, Difficult to assess Difficult to assess due to: Impaired communication                     PT - Cognition Comments: Are we still in a circle? Pt attempts to answer questions and cues during session. Expressive aphasia makes difficult to determine what pt is attempting to communicate. Appears to have difficulty initiating movement. Following commands: Impaired Following commands impaired: Only follows one step commands consistently, Follows one step commands with increased time     Cueing Cueing Techniques: Verbal cues, Gestural cues, Tactile cues, Visual cues     General Comments General comments (skin integrity, edema, etc.): VSS throughout. No significant skin abnormalities noted.    Exercises     Assessment/Plan    PT Assessment Patient needs continued PT services  PT Problem List Decreased strength;Decreased mobility;Decreased safety awareness;Decreased balance;Decreased knowledge of use of DME;Decreased cognition;Decreased activity tolerance;Decreased coordination;Decreased knowledge of precautions       PT Treatment Interventions DME instruction;Therapeutic exercise;Wheelchair mobility training;Gait training;Balance training;Stair training;Neuromuscular re-education;Functional mobility training;Therapeutic activities;Patient/family education    PT Goals (Current goals can be found in the Care Plan section)  Acute Rehab PT Goals Patient Stated Goal: None stated    Frequency Min 2X/week     Co-evaluation  AM-PAC PT 6 Clicks Mobility  Outcome Measure Help needed turning from your back to your side while in a flat bed without using bedrails?: A Lot Help needed moving from lying on your back to sitting on the side  of a flat bed without using bedrails?: A Little Help needed moving to and from a bed to a chair (including a wheelchair)?: A Lot Help needed standing up from a chair using your arms (e.g., wheelchair or bedside chair)?: A Lot Help needed to walk in hospital room?: Total Help needed climbing 3-5 steps with a railing? : Total 6 Click Score: 11    End of Session Equipment Utilized During Treatment: Oxygen Activity Tolerance: Patient limited by fatigue Patient left: in bed;with call bell/phone within reach;with bed alarm set Nurse Communication: Mobility status PT Visit Diagnosis: Unsteadiness on feet (R26.81);Muscle weakness (generalized) (M62.81);Other symptoms and signs involving the nervous system (R29.898)    Time: 1050-1120 PT Time Calculation (min) (ACUTE ONLY): 30 min   Charges:   PT Evaluation $PT Eval Moderate Complexity: 1 Mod PT Treatments $Therapeutic Activity: 8-22 mins PT General Charges $$ ACUTE PT VISIT: 1 Visit         Sabra Morel, PT, DPT  Acute Rehabilitation Services         Office: [phone]     Sabra MARLA Morel 06/17/2024, 3:02 PM

## 2024-06-17 NOTE — Progress Notes (Addendum)
 "                                                                                                                                                                                                                                                                                PROGRESS NOTE     Patient Demographics:    Courtney Burke, is a 69 y.o. female, DOB - June 20, 1955, FMW:[id number]  Outpatient Primary MD for the patient is [name], MD    LOS - 2  Admit date - 06/15/2024    Chief Complaint  Patient presents with   Seizures       Brief Narrative (HPI from H&P)   69 y.o. female with PMH CVA with residual expressive aphasia, seizure disorder, HTN, HLD, gastric bypass, DM II, mood disorder, GERD, PAF, asthma, CKD 3B who presented for evaluation after an episode of syncope and seizure. Per daughter, it was reported that the patient had been weak and fatigued for a few days.  Prior to admission she was getting up for doctors appointment and was reported to start banging her head against the wall then collapsed to the floor.  No loss of consciousness noted but became stiff with shaking and eyes rolling back with associated bowel and urinary incontinence. Also was reported she was having dark watery stools for couple days.  Last seizure approximately 1 month ago and has been compliant on medications.   GI was also consulted on admission.   Hgb 9.5 g/dL on admission and previously around 11 to 12 g/dL.  FOBT positive.   Subjective:    Barnie Moats today in bed has expressive aphasia does not appear to be in any distress, unreliable historian due to underlying expressive aphasia.   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 -  was on Eliquis  & ASA -  held, on IV PPI and Carafate , H&H has dropped significantly with hypotension getting 1 unit of packed RBC  on 06/17/2024, GI on board underwent EGD showing upper GI ulcer.  Continue to  monitor CBC closely for another 24 to 36 hours. GI aspirin  discontinued, Eliquis  if stable on 06/19/2024.  PPI twice daily, Carafate  for 10 days.  Presyncope, witnessed breakthrough seizure in the setting of GI bleed, acute blood loss anemia and hypotension - She had acute GI bleed, unclear if upper or lower, was on Eliquis  and aspirin  both held, had acute drop in H&H from 13.5-9.5, with hypotension and seizure, seizure likely due to hypoperfusion.  Stable on Keppra , also takes as needed benzodiazepine which will be added.  Will check baseline EEG as well.  CT head nonacute.  Currently appears to be back to baseline continue to monitor.   Seizure.   see above   CKD 3B - Creatinine of 1.75 stable compared to recent baseline of 1.8-2.0 - Continue IV hydration, trend renal function   Urinary retention x 1.  Resolved after activity, monitor bladder scans, Flomax  added.    PAF - Remains in normal sinus, rate controlled - Hold Eliquis  in the setting of GI bleed   Hypophosphatemia Hypomagnesemia Hypokalemia  Replete as needed   HLD - Continue atorvastatin    Hx of CVA - Has expressive aphasia from repeated strokes - hold aspirin  - continue statin    GERD - Continue IV PPI   Mood disorder - Continue duloxetine  and amitriptyline   DMII with hyperglycemia -  A1c 5.9% - ISS  Lab Results  Component Value Date   HGBA1C 5.9 (H) 06/16/2024    CBG (last 3)  Recent Labs    06/17/24 0001 06/17/24 0343 06/17/24 0755  GLUCAP 82 80 86          Condition - Extremely Guarded  Family Communication  :   Discussed with patient's niece Geni [phone]  in detail on 06/17/2024.    Called daughter [name] [phone]  06/17/2024 at 8 AM and voicemail left.  Code Status :  Full  Consults  :   GI  PUD Prophylaxis :  PPI   Procedures  :     EGD - Impression:  - Large marginal ulcers, Roux-en-Y  anatomy Recommendation:  - Return patient to hospital ward for ongoing care.                           - Full liquid diet.                           - Continue Protonix  40 mg IV twice daily x 24 hrs,                            then BID                           - Use sucralfate  suspension 1 gram PO QID for 10                            days.                           - No aspirin , ibuprofen, naproxen, or other                            non-steroidal anti-inflammatory drugs.                           -  If Eliquis  is absolutely needed, resume 1/16.  CT chest abdomen pelvis.  1. No acute intrathoracic, intra-abdominal or intrapelvic trauma identified on this unenhanced exam. 2. Scattered colonic gas fluid levels, which may reflect diarrhea or recent laxative/enema use. No bowel obstruction  CT head and C-spine.  Nonacute.      Disposition Plan  :    Status is: Inpatient   DVT Prophylaxis  :    SCDs Start: 06/15/24 1943    Lab Results  Component Value Date   PLT 423 (H) 06/17/2024    Diet :  Diet Order             Diet NPO time specified Except for: Sips with Meds  Diet effective now                    Inpatient Medications  Scheduled Meds:  sodium chloride    Intravenous Once   amitriptyline   50 mg Oral QHS   atorvastatin   80 mg Oral Daily   DULoxetine   30 mg Oral Daily   insulin  aspart  0-9 Units Subcutaneous Q4H   levETIRAcetam   1,000 mg Oral BID   pantoprazole  (PROTONIX ) IV  40 mg Intravenous Q12H   sucralfate   1 g Oral Q6H   tamsulosin   0.4 mg Oral Daily   topiramate   100 mg Oral BID   ursodiol   300 mg Oral BID   Continuous Infusions:  lactated ringers  1,000 mL with potassium chloride  20 mEq infusion     PRN Meds:.acetaminophen  **OR** acetaminophen , bisacodyl , ondansetron  **OR** ondansetron  (ZOFRAN ) IV, senna-docusate  Antibiotics  :    Anti-infectives (From admission, onward)    None         Objective:   Vitals:   06/16/24 2359 06/17/24  0000 06/17/24 0341 06/17/24 0742  BP: 115/73 110/66  104/61  Pulse:  81  78  Resp:    20  Temp: 98.8 F (37.1 C)  98.2 F (36.8 C) 98.5 F (36.9 C)  TempSrc: Oral  Oral Oral  SpO2:  98%  97%    Wt Readings from Last 3 Encounters:  05/18/24 66.6 kg  03/05/24 74.7 kg  02/14/24 75 kg     Intake/Output Summary (Last 24 hours) at 06/17/2024 0944 Last data filed at 06/17/2024 0400 Gross per 24 hour  Intake --  Output 600 ml  Net -600 ml     Physical Exam  Awake Alert, No new F.N deficits, Normal affect [place].AT,PERRAL Supple Neck, No JVD,   Symmetrical Chest wall movement, Good air movement bilaterally, CTAB RRR,No Gallops,Rubs or new Murmurs,  +ve B.Sounds, Abd Soft, No tenderness,   No Cyanosis, Clubbing or edema       Data Review:    Recent Labs  Lab 06/15/24 1555 06/15/24 1606 06/16/24 0310 06/17/24 0339  WBC  --  10.1 8.5 6.6  HGB 9.5* 9.5* 9.1* 8.0*  HCT 28.0* 29.2* 27.0* 23.4*  PLT  --  488* 396 423*  MCV  --  84.6 83.9 81.8  MCH  --  27.5 28.3 28.0  MCHC  --  32.5 33.7 34.2  RDW  --  14.4 14.5 14.6  LYMPHSABS  --  1.2  --  1.5  MONOABS  --  0.4  --  0.3  EOSABS  --  0.1  --  0.4  BASOSABS  --  0.0  --  0.0    Recent Labs  Lab 06/15/24 1555 06/15/24 1603 06/15/24 1951 06/15/24 1955 06/16/24 0310 06/17/24 9660  NA 138 135  --   --  135 138  K 2.5* 2.6*  --   --  3.5 3.2*  CL 104 103  --   --  106 110  CO2  --  16*  --   --  17* 18*  ANIONGAP  --  16*  --   --  12 11  GLUCOSE 207* 214*  --   --  96 82  BUN 17 18  --   --  17 13  CREATININE 1.70* 1.75*  --   --  1.67* 1.50*  AST  --  31  --   --   --   --   ALT  --  9  --   --   --   --   ALKPHOS  --  100  --   --   --   --   BILITOT  --  0.5  --   --   --   --   ALBUMIN  --  2.6*  --   --   --   --   LATICACIDVEN 3.2*  --  1.8  --   --   --   HGBA1C  --   --   --   --  5.9*  --   MG  --   --   --  1.7 2.3 1.9  PHOS  --   --   --  2.3* 3.2  --   CALCIUM   --  7.6*  --   --  7.8* 8.0*       Recent Labs  Lab 06/15/24 1555 06/15/24 1603 06/15/24 1951 06/15/24 1955 06/16/24 0310 06/17/24 0339  LATICACIDVEN 3.2*  --  1.8  --   --   --   HGBA1C  --   --   --   --  5.9*  --   MG  --   --   --  1.7 2.3 1.9  CALCIUM   --  7.6*  --   --  7.8* 8.0*    --------------------------------------------------------------------------------------------------------------- Lab Results  Component Value Date   CHOL 111 08/23/2022   HDL 70 08/23/2022   LDLCALC 26 08/23/2022   TRIG 71 08/23/2022   CHOLHDL 1.6 08/23/2022    Lab Results  Component Value Date   HGBA1C 5.9 (H) 06/16/2024   No results for input(s): TSH, T4TOTAL, FREET4, T3FREE, THYROIDAB in the last 72 hours. Recent Labs    06/16/24 0310  VITAMINB12 1,427*  FERRITIN 774*  TIBC 168*  IRON 21*   ------------------------------------------------------------------------------------------------------------------ Cardiac Enzymes No results for input(s): CKMB, TROPONINI, MYOGLOBIN in the last 168 hours.  Invalid input(s): CK  Micro Results No results found for this or any previous visit (from the past 240 hours).  Radiology Report DG Pelvis Portable Result Date: 06/15/2024 CLINICAL DATA:  Clemens, seizures EXAM: PORTABLE PELVIS 1-2 VIEWS COMPARISON:  07/13/2020 FINDINGS: Supine frontal view of the pelvis includes both hips. No acute displaced fracture. Alignment is anatomic. Mild symmetrical bilateral hip osteoarthritis. Sacroiliac joints are unremarkable. IMPRESSION: 1. No acute displaced fracture. 2. Mild symmetrical bilateral hip osteoarthritis. Electronically Signed   By: Ozell Daring M.D.   On: 06/15/2024 17:07   CT Cervical Spine Wo Contrast Result Date: 06/15/2024 CLINICAL DATA:  Clemens, hit head EXAM: CT CERVICAL SPINE WITHOUT CONTRAST TECHNIQUE: Multidetector CT imaging of the cervical spine was performed without intravenous contrast. Multiplanar CT image reconstructions were also generated.  RADIATION DOSE REDUCTION: This exam was performed according to  the departmental dose-optimization program which includes automated exposure control, adjustment of the mA and/or kV according to patient size and/or use of iterative reconstruction technique. COMPARISON:  None Available. FINDINGS: Alignment: Alignment is anatomic. Skull base and vertebrae: No acute fracture. Benign cyst or hemangioma within the C5 vertebral body. No destructive bony abnormalities. Soft tissues and spinal canal: No prevertebral fluid or swelling. No visible canal hematoma. Prominent calcified atheromatous plaque at the carotid bifurcations. Disc levels: Hypertrophic changes at the C1-C2 interface. Disc spaces are relatively well preserved. Upper chest: Airway is patent.  Lung apices are clear. Other: Reconstructed images demonstrate no additional findings. IMPRESSION: 1. No acute cervical spine fracture. Electronically Signed   By: Ozell Daring M.D.   On: 06/15/2024 17:06   CT CHEST ABDOMEN PELVIS WO CONTRAST Result Date: 06/15/2024 CLINICAL DATA:  Clemens, sepsis EXAM: CT CHEST, ABDOMEN AND PELVIS WITHOUT CONTRAST TECHNIQUE: Multidetector CT imaging of the chest, abdomen and pelvis was performed following the standard protocol without IV contrast. RADIATION DOSE REDUCTION: This exam was performed according to the departmental dose-optimization program which includes automated exposure control, adjustment of the mA and/or kV according to patient size and/or use of iterative reconstruction technique. COMPARISON:  05/19/2024 FINDINGS: CT CHEST FINDINGS Cardiovascular: Unenhanced imaging of the heart is unremarkable without pericardial effusion. Normal caliber of the thoracic aorta. Assessment of the vascular lumen cannot be performed without intravenous contrast. Mediastinum/Nodes: No enlarged mediastinal, hilar, or axillary lymph nodes. Thyroid  gland, trachea, and esophagus demonstrate no significant findings. Lungs/Pleura: No acute  airspace disease, effusion, or pneumothorax. Central airways are patent. Musculoskeletal: No acute or destructive bony abnormalities. Reconstructed images demonstrate no additional findings. CT ABDOMEN PELVIS FINDINGS Hepatobiliary: Unremarkable unenhanced appearance of the liver. Prior cholecystectomy. Pancreas: Unremarkable unenhanced appearance. Spleen: Unremarkable unenhanced appearance. Adrenals/Urinary Tract: The adrenals are stable. No urinary tract calculi or obstructive uropathy. Bladder is unremarkable. Stomach/Bowel: No bowel obstruction or ileus. Scattered colonic gas fluid levels are identified, which may reflect diarrhea or recent laxative/enema use. Prior gastric bypass surgery. Vascular/Lymphatic: No significant vascular findings are present. No enlarged abdominal or pelvic lymph nodes. Reproductive: Uterus and bilateral adnexa are unremarkable. Other: No free fluid or free intraperitoneal gas. No abdominal wall hernia. Musculoskeletal: No acute or destructive bony abnormalities. Reconstructed images demonstrate no additional findings. IMPRESSION: 1. No acute intrathoracic, intra-abdominal or intrapelvic trauma identified on this unenhanced exam. 2. Scattered colonic gas fluid levels, which may reflect diarrhea or recent laxative/enema use. No bowel obstruction. Electronically Signed   By: Ozell Daring M.D.   On: 06/15/2024 17:04   CT Head Wo Contrast Result Date: 06/15/2024 CLINICAL DATA:  Seizures, hit head EXAM: CT HEAD WITHOUT CONTRAST TECHNIQUE: Contiguous axial images were obtained from the base of the skull through the vertex without intravenous contrast. RADIATION DOSE REDUCTION: This exam was performed according to the departmental dose-optimization program which includes automated exposure control, adjustment of the mA and/or kV according to patient size and/or use of iterative reconstruction technique. COMPARISON:  02/25/2024 FINDINGS: Brain: Stable areas of encephalomalacia within  the left frontal, left parietal, left temporal, and right parietal regions consistent with previous infarcts. No signs of acute infarct or hemorrhage. Lateral ventricles and midline structures are stable. No acute extra-axial fluid collections. No mass effect. Vascular: No hyperdense vessel or unexpected calcification. Stable stent within the left MCA distribution. Skull: Normal. Negative for fracture or focal lesion. Sinuses/Orbits: No acute finding. Other: None. IMPRESSION: 1. No acute intracranial process. 2. Stable chronic ischemic changes as above. Electronically Signed  By: Ozell Daring M.D.   On: 06/15/2024 17:01   DG Chest Portable 1 View Result Date: 06/15/2024 CLINICAL DATA:  Clemens, seizures EXAM: PORTABLE CHEST 1 VIEW COMPARISON:  07/02/2020 FINDINGS: Single frontal view of the chest demonstrates an unremarkable cardiac silhouette. No airspace disease, effusion, or pneumothorax. No acute bony abnormalities. IMPRESSION: 1. No acute intrathoracic process. Electronically Signed   By: Ozell Daring M.D.   On: 06/15/2024 16:59     Signature  -   Lavada Stank M.D on 06/17/2024 at 9:44 AM   -  To page go to [email or web address]   "

## 2024-06-17 NOTE — Discharge Instructions (Addendum)
 [employer] Female Catheter contacts:  -[facility] [employer]: [phone]    [email or web address]  -[facility]:   [phone] [email or web address] Female Catheter contacts:      Follow with Primary MD [name], MD in 2 to 3 days  Get CBC, CMP, Magnesium , 2 view Chest X ray -  checked next visit with your primary MD in 2 days  Activity: As tolerated with Full fall precautions use walker/cane & assistance as needed  Disposition Home  Diet: Dysphagia 3 diet with feeding assistance and aspiration precautions.  Special Instructions: If you have smoked or chewed Tobacco  in the last 2 yrs please stop smoking, stop any regular Alcohol  and or any Recreational drug use.  On your next visit with your primary care physician please Get Medicines reviewed and adjusted.  Please request your Prim.MD to go over all Hospital Tests and Procedure/Radiological results at the follow up, please get all Hospital records sent to your Prim MD by signing hospital release before you go home.  If you experience worsening of your admission symptoms, develop shortness of breath, life threatening emergency, suicidal or homicidal thoughts you must seek medical attention immediately by calling 911 or calling your MD immediately  if symptoms less severe.  You Must read complete instructions/literature along with all the possible adverse reactions/side effects for all the Medicines you take and that have been prescribed to you. Take any new Medicines after you have completely understood and accpet all the possible adverse reactions/side effects.   Do not drive when taking Pain medications.  Do not take more than prescribed Pain, Sleep and Anxiety Medications  Wear Seat belts while driving.

## 2024-06-17 NOTE — Progress Notes (Signed)
 "    Progress Note    ASSESSMENT AND PLAN:   IDA with H+ stools/UGI bleed. EGD with large marginal ulcers.  No active bleeding. Hb seems to be equilibrating A-fib on Eliquis  (last dose 1/12) H/O CVA with expressive aphasia H/O seizures on Keppra  and Topamax   Plan: - Trend CBC. - Agree with transfusion - Continue IV Protonix .  Can transition to p.o. 40BID tomorrow - Carafate  elixir 1 g p.o. 4 times daily x 10 days. - Stop aspirin  - No nonsteroidals strictly. - If Eliquis  absolutely needed can restart 1/16. Pt and daughter do understand there is a higher than normal risk of bleeding on Eliquis . - Follow Bx for HP - Recommend repeating EGD in 12 to 18 weeks to ensure healing. - FU with Dr. Wilhelmenia in 6-8 weeks.  - Will sign off for now      SUBJECTIVE   Had bowel movement which was brown in color.  I did observe it along with physical therapy.  No melena. No abdominal pain Hemoglobin drifted down to 8 being transfused    OBJECTIVE:     Vital signs in last 24 hours: Temp:  [97.3 F (36.3 C)-98.8 F (37.1 C)] 97.8 F (36.6 C) (01/14 1024) Pulse Rate:  [78-132] 78 (01/14 1024) Resp:  [10-24] 20 (01/14 1024) BP: (96-117)/(58-85) 102/60 (01/14 1024) SpO2:  [94 %-100 %] 98 % (01/14 1024) Last BM Date : 06/15/24 General:   Alert, has expressive aphasia. Abdomen:  Soft, nondistended, nontender.  Normal bowel sounds,.       Neurologic:  Alert    Intake/Output from previous day: 01/13 0701 - 01/14 0700 In: -  Out: 600 [Urine:600] Intake/Output this shift: No intake/output data recorded.  Lab Results: Recent Labs    06/15/24 1606 06/16/24 0310 06/17/24 0339  WBC 10.1 8.5 6.6  HGB 9.5* 9.1* 8.0*  HCT 29.2* 27.0* 23.4*  PLT 488* 396 423*   BMET Recent Labs    06/15/24 1603 06/16/24 0310 06/17/24 0339  NA 135 135 138  K 2.6* 3.5 3.2*  CL 103 106 110  CO2 16* 17* 18*  GLUCOSE 214* 96 82  BUN 18 17 13   CREATININE 1.75* 1.67* 1.50*  CALCIUM  7.6*  7.8* 8.0*   LFT Recent Labs    06/15/24 1603  PROT 5.9*  ALBUMIN 2.6*  AST 31  ALT 9  ALKPHOS 100  BILITOT 0.5   PT/INR No results for input(s): LABPROT, INR in the last 72 hours. Hepatitis Panel No results for input(s): HEPBSAG, HCVAB, HEPAIGM, HEPBIGM in the last 72 hours.  DG Pelvis Portable Result Date: 06/15/2024 CLINICAL DATA:  Clemens, seizures EXAM: PORTABLE PELVIS 1-2 VIEWS COMPARISON:  07/13/2020 FINDINGS: Supine frontal view of the pelvis includes both hips. No acute displaced fracture. Alignment is anatomic. Mild symmetrical bilateral hip osteoarthritis. Sacroiliac joints are unremarkable. IMPRESSION: 1. No acute displaced fracture. 2. Mild symmetrical bilateral hip osteoarthritis. Electronically Signed   By: Ozell Daring M.D.   On: 06/15/2024 17:07   CT Cervical Spine Wo Contrast Result Date: 06/15/2024 CLINICAL DATA:  Clemens, hit head EXAM: CT CERVICAL SPINE WITHOUT CONTRAST TECHNIQUE: Multidetector CT imaging of the cervical spine was performed without intravenous contrast. Multiplanar CT image reconstructions were also generated. RADIATION DOSE REDUCTION: This exam was performed according to the departmental dose-optimization program which includes automated exposure control, adjustment of the mA and/or kV according to patient size and/or use of iterative reconstruction technique. COMPARISON:  None Available. FINDINGS: Alignment: Alignment is anatomic. Skull base and  vertebrae: No acute fracture. Benign cyst or hemangioma within the C5 vertebral body. No destructive bony abnormalities. Soft tissues and spinal canal: No prevertebral fluid or swelling. No visible canal hematoma. Prominent calcified atheromatous plaque at the carotid bifurcations. Disc levels: Hypertrophic changes at the C1-C2 interface. Disc spaces are relatively well preserved. Upper chest: Airway is patent.  Lung apices are clear. Other: Reconstructed images demonstrate no additional findings.  IMPRESSION: 1. No acute cervical spine fracture. Electronically Signed   By: Ozell Daring M.D.   On: 06/15/2024 17:06   CT CHEST ABDOMEN PELVIS WO CONTRAST Result Date: 06/15/2024 CLINICAL DATA:  Clemens, sepsis EXAM: CT CHEST, ABDOMEN AND PELVIS WITHOUT CONTRAST TECHNIQUE: Multidetector CT imaging of the chest, abdomen and pelvis was performed following the standard protocol without IV contrast. RADIATION DOSE REDUCTION: This exam was performed according to the departmental dose-optimization program which includes automated exposure control, adjustment of the mA and/or kV according to patient size and/or use of iterative reconstruction technique. COMPARISON:  05/19/2024 FINDINGS: CT CHEST FINDINGS Cardiovascular: Unenhanced imaging of the heart is unremarkable without pericardial effusion. Normal caliber of the thoracic aorta. Assessment of the vascular lumen cannot be performed without intravenous contrast. Mediastinum/Nodes: No enlarged mediastinal, hilar, or axillary lymph nodes. Thyroid  gland, trachea, and esophagus demonstrate no significant findings. Lungs/Pleura: No acute airspace disease, effusion, or pneumothorax. Central airways are patent. Musculoskeletal: No acute or destructive bony abnormalities. Reconstructed images demonstrate no additional findings. CT ABDOMEN PELVIS FINDINGS Hepatobiliary: Unremarkable unenhanced appearance of the liver. Prior cholecystectomy. Pancreas: Unremarkable unenhanced appearance. Spleen: Unremarkable unenhanced appearance. Adrenals/Urinary Tract: The adrenals are stable. No urinary tract calculi or obstructive uropathy. Bladder is unremarkable. Stomach/Bowel: No bowel obstruction or ileus. Scattered colonic gas fluid levels are identified, which may reflect diarrhea or recent laxative/enema use. Prior gastric bypass surgery. Vascular/Lymphatic: No significant vascular findings are present. No enlarged abdominal or pelvic lymph nodes. Reproductive: Uterus and bilateral  adnexa are unremarkable. Other: No free fluid or free intraperitoneal gas. No abdominal wall hernia. Musculoskeletal: No acute or destructive bony abnormalities. Reconstructed images demonstrate no additional findings. IMPRESSION: 1. No acute intrathoracic, intra-abdominal or intrapelvic trauma identified on this unenhanced exam. 2. Scattered colonic gas fluid levels, which may reflect diarrhea or recent laxative/enema use. No bowel obstruction. Electronically Signed   By: Ozell Daring M.D.   On: 06/15/2024 17:04   CT Head Wo Contrast Result Date: 06/15/2024 CLINICAL DATA:  Seizures, hit head EXAM: CT HEAD WITHOUT CONTRAST TECHNIQUE: Contiguous axial images were obtained from the base of the skull through the vertex without intravenous contrast. RADIATION DOSE REDUCTION: This exam was performed according to the departmental dose-optimization program which includes automated exposure control, adjustment of the mA and/or kV according to patient size and/or use of iterative reconstruction technique. COMPARISON:  02/25/2024 FINDINGS: Brain: Stable areas of encephalomalacia within the left frontal, left parietal, left temporal, and right parietal regions consistent with previous infarcts. No signs of acute infarct or hemorrhage. Lateral ventricles and midline structures are stable. No acute extra-axial fluid collections. No mass effect. Vascular: No hyperdense vessel or unexpected calcification. Stable stent within the left MCA distribution. Skull: Normal. Negative for fracture or focal lesion. Sinuses/Orbits: No acute finding. Other: None. IMPRESSION: 1. No acute intracranial process. 2. Stable chronic ischemic changes as above. Electronically Signed   By: Ozell Daring M.D.   On: 06/15/2024 17:01   DG Chest Portable 1 View Result Date: 06/15/2024 CLINICAL DATA:  Clemens, seizures EXAM: PORTABLE CHEST 1 VIEW COMPARISON:  07/02/2020 FINDINGS: Single  frontal view of the chest demonstrates an unremarkable cardiac  silhouette. No airspace disease, effusion, or pneumothorax. No acute bony abnormalities. IMPRESSION: 1. No acute intrathoracic process. Electronically Signed   By: Ozell Daring M.D.   On: 06/15/2024 16:59     Principal Problem:   Orthostatic hypotension Active Problems:   CKD stage 3b, GFR 30-44 ml/min (HCC)   Paroxysmal A-fib (HCC)   Hypokalemia   Hypomagnesemia   GIB (gastrointestinal bleeding)   Hypotension   Hypophosphatemia   Type 2 diabetes mellitus with hyperglycemia, without long-term current use of insulin  (HCC)     LOS: 2 days     Anselm Bring, MD 06/17/2024, 12:11 PM [facility] [phone]       "

## 2024-06-17 NOTE — Procedures (Signed)
 Patient Name: Courtney Burke  MRN: [id number]  Epilepsy Attending: Arlin MALVA Krebs  Referring Physician/Provider: Dennise Lavada POUR, MD  Date: 06/17/2024 Duration: 23.16 mins  Patient history: 69 yo F with presyncope, witnessed breakthrough seizure in the setting of GI bleed. EEG to evaluate for seizure   Level of alertness: Awake  AEDs during EEG study: LEV, TPM  Technical aspects: This EEG study was done with scalp electrodes positioned according to the 10-20 International system of electrode placement. Electrical activity was reviewed with band pass filter of 1-70Hz , sensitivity of 7 uV/mm, display speed of 33mm/sec with a 60Hz  notched filter applied as appropriate. EEG data were recorded continuously and digitally stored.  Video monitoring was available and reviewed as appropriate.  Description: The posterior dominant rhythm consists of 7 Hz activity of moderate voltage (25-35 uV) seen predominantly in posterior head regions, symmetric and reactive to eye opening and eye closing. EEG showed continuous generalized 5 to 7 Hz theta slowing. Hyperventilation and photic stimulation were not performed.     ABNORMALITY - Continuous slow, generalized  IMPRESSION: This study is suggestive of generalized cerebral dysfunction (encephalopathy). No seizures or epileptiform discharges were seen throughout the recording.  Landen Breeland O Jae Bruck

## 2024-06-18 ENCOUNTER — Other Ambulatory Visit (HOSPITAL_COMMUNITY): Payer: Self-pay

## 2024-06-18 DIAGNOSIS — I951 Orthostatic hypotension: Secondary | ICD-10-CM | POA: Diagnosis not present

## 2024-06-18 LAB — CBC WITH DIFFERENTIAL/PLATELET
Abs Immature Granulocytes: 0.15 K/uL — ABNORMAL HIGH (ref 0.00–0.07)
Basophils Absolute: 0 K/uL (ref 0.0–0.1)
Basophils Relative: 0 %
Eosinophils Absolute: 0.4 K/uL (ref 0.0–0.5)
Eosinophils Relative: 5 %
HCT: 26.5 % — ABNORMAL LOW (ref 36.0–46.0)
Hemoglobin: 9 g/dL — ABNORMAL LOW (ref 12.0–15.0)
Immature Granulocytes: 2 %
Lymphocytes Relative: 19 %
Lymphs Abs: 1.5 K/uL (ref 0.7–4.0)
MCH: 27.9 pg (ref 26.0–34.0)
MCHC: 34 g/dL (ref 30.0–36.0)
MCV: 82 fL (ref 80.0–100.0)
Monocytes Absolute: 0.4 K/uL (ref 0.1–1.0)
Monocytes Relative: 6 %
Neutro Abs: 5.4 K/uL (ref 1.7–7.7)
Neutrophils Relative %: 68 %
Platelets: 422 K/uL — ABNORMAL HIGH (ref 150–400)
RBC: 3.23 MIL/uL — ABNORMAL LOW (ref 3.87–5.11)
RDW: 14.7 % (ref 11.5–15.5)
WBC: 7.8 K/uL (ref 4.0–10.5)
nRBC: 0 % (ref 0.0–0.2)

## 2024-06-18 LAB — TYPE AND SCREEN
ABO/RH(D): O POS
Antibody Screen: NEGATIVE
Unit division: 0

## 2024-06-18 LAB — BASIC METABOLIC PANEL WITH GFR
Anion gap: 9 (ref 5–15)
BUN: 11 mg/dL (ref 8–23)
CO2: 17 mmol/L — ABNORMAL LOW (ref 22–32)
Calcium: 8.3 mg/dL — ABNORMAL LOW (ref 8.9–10.3)
Chloride: 112 mmol/L — ABNORMAL HIGH (ref 98–111)
Creatinine, Ser: 1.45 mg/dL — ABNORMAL HIGH (ref 0.44–1.00)
GFR, Estimated: 39 mL/min — ABNORMAL LOW
Glucose, Bld: 85 mg/dL (ref 70–99)
Potassium: 3.6 mmol/L (ref 3.5–5.1)
Sodium: 138 mmol/L (ref 135–145)

## 2024-06-18 LAB — BPAM RBC
Blood Product Expiration Date: 202601292359
ISSUE DATE / TIME: 202601140946
Unit Type and Rh: 5100

## 2024-06-18 LAB — GLUCOSE, CAPILLARY
Glucose-Capillary: 86 mg/dL (ref 70–99)
Glucose-Capillary: 91 mg/dL (ref 70–99)
Glucose-Capillary: 110 mg/dL — ABNORMAL HIGH (ref 70–99)
Glucose-Capillary: 92 mg/dL (ref 70–99)

## 2024-06-18 LAB — MAGNESIUM: Magnesium: 1.7 mg/dL (ref 1.7–2.4)

## 2024-06-18 LAB — PHOSPHORUS: Phosphorus: 2.4 mg/dL — ABNORMAL LOW (ref 2.5–4.6)

## 2024-06-18 MED ORDER — SUCRALFATE 1 GM/10ML PO SUSP
1.0000 g | Freq: Four times a day (QID) | ORAL | 0 refills | Status: AC
  Filled 2024-06-18: qty 360, 9d supply, fill #0

## 2024-06-18 MED ORDER — TAMSULOSIN HCL 0.4 MG PO CAPS: 0.4000 mg | ORAL_CAPSULE | Freq: Every day | ORAL | Status: AC

## 2024-06-18 MED ORDER — PANTOPRAZOLE SODIUM 40 MG PO TBEC
40.0000 mg | DELAYED_RELEASE_TABLET | Freq: Two times a day (BID) | ORAL | 1 refills | Status: AC
  Filled 2024-06-18: qty 60, 30d supply, fill #0

## 2024-06-18 MED ORDER — APIXABAN 5 MG PO TABS: 5.0000 mg | ORAL_TABLET | Freq: Two times a day (BID) | ORAL | Status: AC

## 2024-06-18 MED ORDER — POTASSIUM PHOSPHATES 15 MMOLE/5ML IV SOLN
30.0000 mmol | Freq: Once | INTRAVENOUS | Status: AC
  Administered 2024-06-18: 30 mmol via INTRAVENOUS
  Filled 2024-06-18: qty 10

## 2024-06-18 NOTE — Discharge Summary (Signed)
 "                                                                                                                                                                                Discharge summary note.  Courtney Burke FMW:[id number] DOB: [birth year] DOA: 06/15/2024  PCP: [name], MD  Admit date: 06/15/2024  Discharge date: 06/18/2024  Admitted From: Home   Disposition:  Home   Recommendations for Outpatient Follow-up:   Follow up with PCP in 1-2 weeks  PCP Please obtain BMP/CBC, 2 view CXR in 1week,  (see Discharge instructions)   PCP Please follow up on the following pending results:    Home Health: PT, SLP if she qualifies Equipment/Devices: See below Consultations: GI Discharge Condition: Stable    CODE STATUS: Full    Diet Recommendation: Dysphagia 3 diet with feeding assistance and aspiration precautions    Chief Complaint  Patient presents with   Seizures     Brief history of present illness from the day of admission and additional interim summary    69 y.o. female with PMH CVA with residual expressive aphasia, seizure disorder, HTN, HLD, gastric bypass, DM II, mood disorder, GERD, PAF, asthma, CKD 3B who presented for evaluation after an episode of syncope and seizure. Per daughter, it was reported that the patient had been weak and fatigued for a few days.  Prior to admission she was getting up for doctors appointment and was reported to start banging her head against the wall then collapsed to the floor.  No loss of consciousness noted but became stiff with shaking and eyes rolling back with associated bowel and urinary incontinence. Also was reported she was having dark watery stools for couple days.  Last seizure approximately 1 month ago and has been compliant on medications.   GI was also consulted on admission.   Hgb 9.5 g/dL on admission and previously around 11 to 12 g/dL.  FOBT positive.                                                                  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 -  was on Eliquis  & ASA -  held, on IV PPI and Carafate , H&H has dropped significantly with hypotension getting 1 unit of packed RBC on 06/17/2024, GI on  board underwent EGD showing upper GI ulcer.  Continue to monitor CBC closely for another 24 to 36 hours. GI aspirin  discontinued, Eliquis  if stable on 06/19/2024.  PPI twice daily, Carafate  for 10 days.  Patient at this time will be discharged home with outpatient PCP follow-up in 2 to 3 days and follow-up with GI outpatient as well.  She is back to her baseline daughter prefers home and not SNF.   Presyncope, witnessed breakthrough seizure in the setting of GI bleed, acute blood loss anemia and hypotension - She had acute UGI bleed, was on Eliquis  and aspirin  both held, had acute drop in H&H from 13.5-9.5, with hypotension and seizure, seizure likely due to hypoperfusion.  Stable on Keppra , had unremarkable and nonacute EEG along with negative CT head. Currently appears to be back to baseline continue to monitor.  This problem stabilized after GI bleed normalized and she received 1 unit of packed RBC.   Seizure.   see above   CKD 3B - Creatinine of 1.75 stable compared to recent baseline of 1.8-2.0 - No acute issues   Urinary retention x 1.  Resolved after activity, monitor bladder scans, Flomax  added.     PAF - Remains in normal sinus, rate controlled - See Eliquis  as above    HLD - Continue atorvastatin    Hx of CVA - Has expressive aphasia from repeated strokes - Aspirin  discontinued continue statin, Eliquis  resumed from 06/19/2024   GERD - Continue PPI twice daily   Mood disorder - Continue duloxetine  and amitriptyline    Stress-induced hyperglycemia -  A1c 5.9% - Stable now Discharge diagnosis     Principal Problem:   Orthostatic  hypotension Active Problems:   GIB (gastrointestinal bleeding)   Paroxysmal A-fib (HCC)   CKD stage 3b, GFR 30-44 ml/min (HCC)   Hypokalemia   Hypomagnesemia   Hypotension   Hypophosphatemia   Type 2 diabetes mellitus with hyperglycemia, without long-term current use of insulin  [facility])    Discharge instructions    Discharge Instructions     Discharge instructions   Complete by: As directed    Follow with Primary MD [name], MD in 2 to 3 days  Get CBC, CMP, Magnesium , 2 view Chest X ray -  checked next visit with your primary MD in 2 days  Activity: As tolerated with Full fall precautions use walker/cane & assistance as needed  Disposition Home  Diet: Dysphagia 3 diet with feeding assistance and aspiration precautions.  Special Instructions: If you have smoked or chewed Tobacco  in the last 2 yrs please stop smoking, stop any regular Alcohol  and or any Recreational drug use.  On your next visit with your primary care physician please Get Medicines reviewed and adjusted.  Please request your Prim.MD to go over all Hospital Tests and Procedure/Radiological results at the follow up, please get all Hospital records sent to your Prim MD by signing hospital release before you go home.  If you experience worsening of your admission symptoms, develop shortness of breath, life threatening emergency, suicidal or homicidal thoughts you must seek medical attention immediately by calling 911 or calling your MD immediately  if symptoms less severe.  You Must read complete instructions/literature along with all the possible adverse reactions/side effects for all the Medicines you take and that have been prescribed to you. Take any new Medicines after you have completely understood and accpet all the possible adverse reactions/side effects.   Do not drive when taking Pain medications.  Do not  take more than prescribed Pain, Sleep and Anxiety Medications  Wear Seat belts while driving.    Increase activity slowly   Complete by: As directed        Discharge Medications   Allergies as of 06/18/2024       Reactions   Codeine Hives, Swelling   Swollen tongue   Homatropine Itching   Iodinated Contrast Media Itching, Other (See Comments)   Doxycycline Nausea And Vomiting   Sulfa Antibiotics Hives, Rash   Ace Inhibitors Itching, Rash, Cough   Hydrocodone Itching   Hydrocodone Bit-homatrop Mbr Itching   Sulfamethoxazole Hives        Medication List     STOP taking these medications    aspirin  EC 81 MG tablet   omeprazole 40 MG capsule Commonly known as: PRILOSEC       TAKE these medications    acetaminophen  500 MG tablet Commonly known as: TYLENOL  Take 1,000 mg by mouth every 6 (six) hours as needed for moderate pain or headache.   amitriptyline  50 MG tablet Commonly known as: ELAVIL  Take 1 tablet (50 mg total) by mouth at bedtime.   apixaban  5 MG Tabs tablet Commonly known as: ELIQUIS  Take 1 tablet (5 mg total) by mouth 2 (two) times daily. Start taking on: June 19, 2024   atorvastatin  80 MG tablet Commonly known as: Lipitor  Take 1 tablet (80 mg total) by mouth daily.   DULoxetine  30 MG capsule Commonly known as: CYMBALTA  TAKE 1 CAPSULE BY MOUTH EVERY DAY   levETIRAcetam  1000 MG tablet Commonly known as: KEPPRA  Take 1 tablet (1,000 mg total) by mouth 2 (two) times daily.   pantoprazole  40 MG tablet Commonly known as: Protonix  Take 1 tablet (40 mg total) by mouth 2 (two) times daily before a meal.   Rimegepant Sulfate 75 MG Tbdp Take 75 mg by mouth as needed (migraine).   sucralfate  1 GM/10ML suspension Commonly known as: CARAFATE  Take 10 mLs (1 g total) by mouth every 6 (six) hours.   tamsulosin  0.4 MG Caps capsule Commonly known as: FLOMAX  Take 1 capsule (0.4 mg total) by mouth daily.   topiramate  100 MG tablet Commonly known as: TOPAMAX  Take 1 tablet (100 mg total) by mouth 2 (two) times daily. Take 1 tablet twice  daily   ursodiol  250 MG tablet Commonly known as: ACTIGALL  Take 250 mg by mouth 2 (two) times daily.   Valtoco  15 MG Dose 2 x 7.5 MG/0.1ML Lqpk Generic drug: diazePAM  (15 MG Dose) Place 15 mg into the nose as needed (seizure rescue). At onset of seizure, may repeat once based on response and tolerability after >=4 hours. Maximum dose: Two doses per episode. Do not use for more than 1 episode every 5 days or more than 5 episodes per month.         Follow-up Information     [name], MD. Schedule an appointment as soon as possible for a visit in 1 week(s).   Specialty: Family Medicine Contact information: [address] [place] [address] [phone]         Charlanne Groom, MD. Schedule an appointment as soon as possible for a visit in 1 week(s).   Specialties: Gastroenterology, Internal Medicine Contact information: [address] [place] [address] [phone]                 Major procedures and Radiology Reports - PLEASE review detailed and final reports thoroughly  -  EEG adult Result Date: 06/17/2024 Shelton Arlin KIDD, MD     06/17/2024  3:33 PM Patient Name: Anysa Tacey MRN: [id number] Epilepsy Attending: Arlin KIDD Shelton Referring Physician/Provider: Dennise Lavada POUR, MD Date: 06/17/2024 Duration: 23.16 mins Patient history: 69 yo F with presyncope, witnessed breakthrough seizure in the setting of GI bleed. EEG to evaluate for seizure Level of alertness: Awake AEDs during EEG study: LEV, TPM Technical aspects: This EEG study was done with scalp electrodes positioned according to the 10-20 International system of electrode placement. Electrical activity was reviewed with band pass filter of 1-70Hz , sensitivity of 7 uV/mm, display speed of 15mm/sec with a 60Hz  notched filter applied as appropriate. EEG data were recorded continuously and digitally stored.  Video monitoring was available and reviewed as appropriate. Description: The  posterior dominant rhythm consists of 7 Hz activity of moderate voltage (25-35 uV) seen predominantly in posterior head regions, symmetric and reactive to eye opening and eye closing. EEG showed continuous generalized 5 to 7 Hz theta slowing. Hyperventilation and photic stimulation were not performed.   ABNORMALITY - Continuous slow, generalized IMPRESSION: This study is suggestive of generalized cerebral dysfunction (encephalopathy). No seizures or epileptiform discharges were seen throughout the recording. Arlin KIDD Shelton   DG Pelvis Portable Result Date: 06/15/2024 CLINICAL DATA:  Clemens, seizures EXAM: PORTABLE PELVIS 1-2 VIEWS COMPARISON:  07/13/2020 FINDINGS: Supine frontal view of the pelvis includes both hips. No acute displaced fracture. Alignment is anatomic. Mild symmetrical bilateral hip osteoarthritis. Sacroiliac joints are unremarkable. IMPRESSION: 1. No acute displaced fracture. 2. Mild symmetrical bilateral hip osteoarthritis. Electronically Signed   By: Ozell Daring M.D.   On: 06/15/2024 17:07   CT Cervical Spine Wo Contrast Result Date: 06/15/2024 CLINICAL DATA:  Clemens, hit head EXAM: CT CERVICAL SPINE WITHOUT CONTRAST TECHNIQUE: Multidetector CT imaging of the cervical spine was performed without intravenous contrast. Multiplanar CT image reconstructions were also generated. RADIATION DOSE REDUCTION: This exam was performed according to the departmental dose-optimization program which includes automated exposure control, adjustment of the mA and/or kV according to patient size and/or use of iterative reconstruction technique. COMPARISON:  None Available. FINDINGS: Alignment: Alignment is anatomic. Skull base and vertebrae: No acute fracture. Benign cyst or hemangioma within the C5 vertebral body. No destructive bony abnormalities. Soft tissues and spinal canal: No prevertebral fluid or swelling. No visible canal hematoma. Prominent calcified atheromatous plaque at the carotid bifurcations.  Disc levels: Hypertrophic changes at the C1-C2 interface. Disc spaces are relatively well preserved. Upper chest: Airway is patent.  Lung apices are clear. Other: Reconstructed images demonstrate no additional findings. IMPRESSION: 1. No acute cervical spine fracture. Electronically Signed   By: Ozell Daring M.D.   On: 06/15/2024 17:06   CT CHEST ABDOMEN PELVIS WO CONTRAST Result Date: 06/15/2024 CLINICAL DATA:  Clemens, sepsis EXAM: CT CHEST, ABDOMEN AND PELVIS WITHOUT CONTRAST TECHNIQUE: Multidetector CT imaging of the chest, abdomen and pelvis was performed following the standard protocol without IV contrast. RADIATION DOSE REDUCTION: This exam was performed according to the departmental dose-optimization program which includes automated exposure control, adjustment of the mA and/or kV according to patient size and/or use of iterative reconstruction technique. COMPARISON:  05/19/2024 FINDINGS: CT CHEST FINDINGS Cardiovascular: Unenhanced imaging of the heart is unremarkable without pericardial effusion. Normal caliber of the thoracic aorta. Assessment of the vascular lumen cannot be performed without intravenous contrast. Mediastinum/Nodes: No enlarged mediastinal, hilar, or axillary lymph nodes. Thyroid  gland, trachea, and esophagus demonstrate no significant findings. Lungs/Pleura: No acute airspace disease,  effusion, or pneumothorax. Central airways are patent. Musculoskeletal: No acute or destructive bony abnormalities. Reconstructed images demonstrate no additional findings. CT ABDOMEN PELVIS FINDINGS Hepatobiliary: Unremarkable unenhanced appearance of the liver. Prior cholecystectomy. Pancreas: Unremarkable unenhanced appearance. Spleen: Unremarkable unenhanced appearance. Adrenals/Urinary Tract: The adrenals are stable. No urinary tract calculi or obstructive uropathy. Bladder is unremarkable. Stomach/Bowel: No bowel obstruction or ileus. Scattered colonic gas fluid levels are identified, which may  reflect diarrhea or recent laxative/enema use. Prior gastric bypass surgery. Vascular/Lymphatic: No significant vascular findings are present. No enlarged abdominal or pelvic lymph nodes. Reproductive: Uterus and bilateral adnexa are unremarkable. Other: No free fluid or free intraperitoneal gas. No abdominal wall hernia. Musculoskeletal: No acute or destructive bony abnormalities. Reconstructed images demonstrate no additional findings. IMPRESSION: 1. No acute intrathoracic, intra-abdominal or intrapelvic trauma identified on this unenhanced exam. 2. Scattered colonic gas fluid levels, which may reflect diarrhea or recent laxative/enema use. No bowel obstruction. Electronically Signed   By: Ozell Daring M.D.   On: 06/15/2024 17:04   CT Head Wo Contrast Result Date: 06/15/2024 CLINICAL DATA:  Seizures, hit head EXAM: CT HEAD WITHOUT CONTRAST TECHNIQUE: Contiguous axial images were obtained from the base of the skull through the vertex without intravenous contrast. RADIATION DOSE REDUCTION: This exam was performed according to the departmental dose-optimization program which includes automated exposure control, adjustment of the mA and/or kV according to patient size and/or use of iterative reconstruction technique. COMPARISON:  02/25/2024 FINDINGS: Brain: Stable areas of encephalomalacia within the left frontal, left parietal, left temporal, and right parietal regions consistent with previous infarcts. No signs of acute infarct or hemorrhage. Lateral ventricles and midline structures are stable. No acute extra-axial fluid collections. No mass effect. Vascular: No hyperdense vessel or unexpected calcification. Stable stent within the left MCA distribution. Skull: Normal. Negative for fracture or focal lesion. Sinuses/Orbits: No acute finding. Other: None. IMPRESSION: 1. No acute intracranial process. 2. Stable chronic ischemic changes as above. Electronically Signed   By: Ozell Daring M.D.   On: 06/15/2024  17:01   DG Chest Portable 1 View Result Date: 06/15/2024 CLINICAL DATA:  Clemens, seizures EXAM: PORTABLE CHEST 1 VIEW COMPARISON:  07/02/2020 FINDINGS: Single frontal view of the chest demonstrates an unremarkable cardiac silhouette. No airspace disease, effusion, or pneumothorax. No acute bony abnormalities. IMPRESSION: 1. No acute intrathoracic process. Electronically Signed   By: Ozell Daring M.D.   On: 06/15/2024 16:59    Micro Results    No results found for this or any previous visit (from the past 240 hours).  Today   Subjective    Courtney Burke today in bed appears comfortable, expressive aphasia says yes to all questions   Objective   Blood pressure 114/74, pulse 80, temperature 97.7 F (36.5 C), temperature source Oral, resp. rate 14, last menstrual period 09/28/2012, SpO2 97%.   Intake/Output Summary (Last 24 hours) at 06/18/2024 0856 Last data filed at 06/17/2024 1800 Gross per 24 hour  Intake 734.74 ml  Output --  Net 734.74 ml    Exam  Awake Alert, No new F.N deficits,    [place].AT,PERRAL Supple Neck,   Symmetrical Chest wall movement, Good air movement bilaterally, CTAB RRR,No Gallops,   +ve B.Sounds, Abd Soft, Non tender,  No Cyanosis, Clubbing or edema    Data Review   Recent Labs  Lab 06/15/24 1606 06/16/24 0310 06/17/24 0339 06/17/24 1532 06/18/24 0338  WBC 10.1 8.5 6.6 7.7 7.8  HGB 9.5* 9.1* 8.0* 9.7* 9.0*  HCT 29.2* 27.0* 23.4*  28.3* 26.5*  PLT 488* 396 423* 433* 422*  MCV 84.6 83.9 81.8 81.1 82.0  MCH 27.5 28.3 28.0 27.8 27.9  MCHC 32.5 33.7 34.2 34.3 34.0  RDW 14.4 14.5 14.6 14.6 14.7  LYMPHSABS 1.2  --  1.5 1.4 1.5  MONOABS 0.4  --  0.3 0.4 0.4  EOSABS 0.1  --  0.4 0.4 0.4  BASOSABS 0.0  --  0.0 0.0 0.0    Recent Labs  Lab 06/15/24 1555 06/15/24 1603 06/15/24 1951 06/15/24 1955 06/16/24 0310 06/17/24 0339 06/18/24 0338  NA 138 135  --   --  135 138 138  K 2.5* 2.6*  --   --  3.5 3.2* 3.6  CL 104 103  --   --  106 110 112*   CO2  --  16*  --   --  17* 18* 17*  ANIONGAP  --  16*  --   --  12 11 9   GLUCOSE 207* 214*  --   --  96 82 85  BUN 17 18  --   --  17 13 11   CREATININE 1.70* 1.75*  --   --  1.67* 1.50* 1.45*  AST  --  31  --   --   --   --   --   ALT  --  9  --   --   --   --   --   ALKPHOS  --  100  --   --   --   --   --   BILITOT  --  0.5  --   --   --   --   --   ALBUMIN  --  2.6*  --   --   --   --   --   LATICACIDVEN 3.2*  --  1.8  --   --   --   --   HGBA1C  --   --   --   --  5.9*  --   --   MG  --   --   --  1.7 2.3 1.9 1.7  PHOS  --   --   --  2.3* 3.2  --  2.4*  CALCIUM   --  7.6*  --   --  7.8* 8.0* 8.3*    Total Time in preparing paper work, data evaluation and todays exam - 35 minutes  Signature  -    Lavada Stank M.D on 06/18/2024 at 8:56 AM   -  To page go to [email or web address]      "

## 2024-06-18 NOTE — Care Management Important Message (Signed)
 Important Message  Patient Details  Name: Courtney Burke MRN: [id number] Date of Birth: [birth year]   Important Message Given:  Yes - Medicare IM     Claretta Deed 06/18/2024, 3:34 PM

## 2024-06-18 NOTE — TOC Progression Note (Signed)
 Transition of Care [facility]) - Progression Note    Patient Details  Name: Courtney Burke MRN: [id number] Date of Birth: Feb 24, 1956  [facility] CM/SW Contact  Nola Devere Hands, RN Phone Number: 06/18/2024, 9:44 AM  Clinical Narrative:    Case manager spoke with patient's daughter, Arch, concerning discharge plan. She had requested Home Health Services through [place], referral was sent via [facility] and has been accepted. Arch states she will have to pick up her mom after her work day ends, will be after 5:30pm. No further needs identified.    Expected Discharge Plan: Home w Home Health Services Barriers to Discharge: Continued Medical Work up               Expected Discharge Plan and Services In-house Referral: Clinical Social Work Discharge Planning Services: CM Consult Post Acute Care Choice: Durable Medical Equipment, Home Health Living arrangements for the past 2 months: Single Family Home Expected Discharge Date: 06/18/24               DME Arranged: 3-N-1 DME Agency: [employer] Date DME Agency Contacted: 06/18/24 Time DME Agency Contacted: 0901 Representative spoke with at DME Agency: [place] HH Arranged: PT, Speech Therapy HH Agency: [facility] Date [facility] Agency Contacted: 06/18/24 Time HH Agency Contacted: 0930 Representative spoke with at [facility] Agency: [place]   Social Drivers of Health (SDOH) Interventions SDOH Screenings   Food Insecurity: Patient Declined (06/16/2024)  Housing: Patient Unable To Answer (06/16/2024)  Recent Concern: Housing - High Risk (05/18/2024)  Transportation Needs: Patient Unable To Answer (06/16/2024)  Utilities: Patient Unable To Answer (06/16/2024)  Alcohol Screen: Low Risk (05/18/2024)  Depression (PHQ2-9): Medium Risk (02/13/2023)  Financial Resource Strain: Medium Risk (05/18/2024)  Physical Activity: Inactive (05/18/2024)  Social Connections: Unknown (06/16/2024)  Recent Concern: Social Connections  - Moderately Isolated (05/18/2024)  Stress: Stress Concern Present (05/18/2024)  Tobacco Use: Low Risk (05/18/2024)    Readmission Risk Interventions    11/15/2021    1:04 PM  Readmission Risk Prevention Plan  Transportation Screening Complete  PCP or Specialist Appt within 3-5 Days Complete  HRI or Home Care Consult Complete  Social Work Consult for Recovery Care Planning/Counseling Complete  Palliative Care Screening Not Applicable  Medication Review [employer]) Complete

## 2024-06-19 ENCOUNTER — Telehealth: Payer: Self-pay

## 2024-06-19 NOTE — Telephone Encounter (Signed)
 Copied from CRM [id number]. Topic: Clinical - Home Health Verbal Orders >> Jun 19, 2024 12:03 PM Viola FALCON wrote: Caller/Agency: [place] from [facility]  Callback Number: [phone] Service Requested: Physical Therapy Frequency: move start of care visit to 06/24/24  Any new concerns about the patient? No

## 2024-06-19 NOTE — Telephone Encounter (Signed)
 Called [place] from [facility] left a VM giving VO per Dr. Mercer for P.T. starting 1/21

## 2024-06-23 ENCOUNTER — Ambulatory Visit: Payer: Self-pay | Admitting: Gastroenterology

## 2024-06-23 ENCOUNTER — Telehealth: Payer: Self-pay

## 2024-06-23 NOTE — Telephone Encounter (Signed)
 Copied from CRM #[id number]. Topic: Clinical - Home Health Verbal Orders >> Jun 23, 2024 10:36 AM Berwyn MATSU wrote: Caller/Agency: Cher[facility] Callback Number: [phone] Service Requested: Physical Therapy Frequency: 2w2 and then 1 w every other week for 4 visits  Any new concerns about the patient? No

## 2024-06-23 NOTE — Telephone Encounter (Signed)
 Spoke with Cher[facility]  VO given Per Dr. Mercer for P.T.

## 2024-06-24 ENCOUNTER — Telehealth: Payer: Self-pay

## 2024-06-24 NOTE — Telephone Encounter (Signed)
 Upper Endoscopy pathology results mailed to home address.

## 2024-06-25 ENCOUNTER — Telehealth: Payer: Self-pay | Admitting: *Deleted

## 2024-06-25 NOTE — Telephone Encounter (Signed)
"  Orders have been signed and faxed  "

## 2024-06-25 NOTE — Telephone Encounter (Signed)
 Copied from CRM [id number]. Topic: General - Inquiry >> Jun 25, 2024  9:59 AM Laymon HERO wrote: Reason for CRM: Kaley-# [phone] [facility]- Calling to check on orders she dropped off yesterday for Dr Mercer- she is needing it to be signed asap and faxed to # [phone]

## 2024-06-26 ENCOUNTER — Inpatient Hospital Stay: Admitting: Family Medicine

## 2024-07-02 ENCOUNTER — Ambulatory Visit: Payer: Self-pay

## 2024-07-02 NOTE — Telephone Encounter (Signed)
 FYI Only or Action Required?: Action required by provider: update on patient condition.  Patient was last seen in primary care on 05/18/2024 by [name], MD.  Called Nurse Triage reporting Hypotension.  Symptoms began today.  Interventions attempted: Nothing.  Symptoms are: unchanged.  Triage Disposition: Call EMS 911 Now  Patient/caregiver understands and will follow disposition?: Yes     Message from [place] R sent at 07/02/2024  4:48 PM EST   [facility]  calling to report pts vitals signs 80/60 sitting 100/60 laying down pulse was pretty week      Reason for Disposition  [1] Systolic BP < 90 AND [2] feeling weak or lightheaded (e.g., woozy, feeling like they might faint)  Answer Assessment - Initial Assessment Questions Carliss with [facility] physical therapy called to report hypotension with confusion and dizziness. He states this is his first visit with patient but her brother is there and also stated this was not pt's baseline. Upon arrival, pt was confused and not responding to cues. BP 80/60 sitting then 100/60 laying down. Pt is more alert at time caller was transferred to NT and able to hear pt communicating with physical therapist. She is responding to questions but is still reporting dizziness. Pt does have weak carotid and radial pulse. PT does get up to bathroom, with assistance, does not used bedside. Based on symptoms, continued dizziness, recommended eval by EMS as pt does not have any further appts with PCP. Physical therapist to call.      1. BLOOD PRESSURE: What is your blood pressure? Did you take at least two measurements 5 minutes apart?     80/60 sitting; 100/60 lying down; about 20 minutes ago   2. ONSET: When did you take your blood pressure?     [facility] with [facility] physical therapy took BP manually; pt has battery home machine but it was not reading BP   3. HOW: How did you take your blood pressure? (e.g., visiting nurse,  automatic home BP monitor)     Visiting physical therapist   4. HISTORY: Do you have a history of low blood pressure? What is your blood pressure normally?     Yes; Carliss reports normal ranges from past visits 112/70 usually in 110s systolic   5. MEDICINES: Are you taking any medicines for blood pressure? If Yes, ask: Have they been changed recently?     No   6. PULSE RATE: Do you know what your pulse rate is?      Weak radial and carotid pulse per physical therapist; stated HR 80-90  7. OTHER SYMPTOMS: Have you been sick recently? Have you had a recent injury?     Syncopal episode 01/12  Protocols used: Blood Pressure - Low-A-AH

## 2024-07-03 ENCOUNTER — Encounter

## 2024-07-03 NOTE — Telephone Encounter (Signed)
 Called and spoke with Daughter per DPR, patient is   having low Bp 107/73 but has been 53/60, patient has been falling and is unsteady, and her O2 is hard to read, per Dr. Mercer the patient needs to call EMS and go to the ED, she is aware,

## 2024-07-08 ENCOUNTER — Telehealth: Payer: Self-pay | Admitting: Cardiology

## 2024-07-08 NOTE — Telephone Encounter (Signed)
 Called re missed remote

## 2024-08-03 ENCOUNTER — Encounter

## 2024-09-03 ENCOUNTER — Encounter

## 2024-09-09 ENCOUNTER — Ambulatory Visit: Admitting: Adult Health
